# Patient Record
Sex: Male | Born: 1940
Health system: Southern US, Community
[De-identification: ages and names within clinical notes are randomized; demographics above are authoritative.]

## PROBLEM LIST (undated history)

## (undated) DIAGNOSIS — M1990 Unspecified osteoarthritis, unspecified site: Secondary | ICD-10-CM

## (undated) DIAGNOSIS — I779 Disorder of arteries and arterioles, unspecified: Secondary | ICD-10-CM

## (undated) DIAGNOSIS — F329 Major depressive disorder, single episode, unspecified: Secondary | ICD-10-CM

## (undated) DIAGNOSIS — I519 Heart disease, unspecified: Secondary | ICD-10-CM

## (undated) DIAGNOSIS — I4819 Other persistent atrial fibrillation: Secondary | ICD-10-CM

## (undated) DIAGNOSIS — E785 Hyperlipidemia, unspecified: Secondary | ICD-10-CM

## (undated) DIAGNOSIS — S060X9A Concussion with loss of consciousness of unspecified duration, initial encounter: Secondary | ICD-10-CM

## (undated) DIAGNOSIS — I2510 Atherosclerotic heart disease of native coronary artery without angina pectoris: Secondary | ICD-10-CM

## (undated) DIAGNOSIS — G4733 Obstructive sleep apnea (adult) (pediatric): Secondary | ICD-10-CM

## (undated) DIAGNOSIS — C801 Malignant (primary) neoplasm, unspecified: Secondary | ICD-10-CM

## (undated) DIAGNOSIS — I5022 Chronic systolic (congestive) heart failure: Secondary | ICD-10-CM

## (undated) DIAGNOSIS — S060XAA Concussion with loss of consciousness status unknown, initial encounter: Secondary | ICD-10-CM

## (undated) DIAGNOSIS — I340 Nonrheumatic mitral (valve) insufficiency: Secondary | ICD-10-CM

## (undated) DIAGNOSIS — I10 Essential (primary) hypertension: Secondary | ICD-10-CM

## (undated) DIAGNOSIS — E119 Type 2 diabetes mellitus without complications: Secondary | ICD-10-CM

## (undated) DIAGNOSIS — I219 Acute myocardial infarction, unspecified: Secondary | ICD-10-CM

## (undated) DIAGNOSIS — I509 Heart failure, unspecified: Secondary | ICD-10-CM

## (undated) DIAGNOSIS — E079 Disorder of thyroid, unspecified: Secondary | ICD-10-CM

## (undated) HISTORY — DX: Obstructive sleep apnea (adult) (pediatric): G47.33

## (undated) HISTORY — PX: CAROTID ARTERY ANGIOPLASTY: SHX1300

## (undated) HISTORY — DX: Concussion with loss of consciousness status unknown, initial encounter: S06.0XAA

## (undated) HISTORY — DX: Atherosclerotic heart disease of native coronary artery without angina pectoris: I25.10

## (undated) HISTORY — DX: Major depressive disorder, single episode, unspecified: F32.9

## (undated) HISTORY — PX: CARDIAC CATHETERIZATION: SHX172

## (undated) HISTORY — DX: Type 2 diabetes mellitus without complications: E11.9

## (undated) HISTORY — PX: CORONARY ARTERY BYPASS GRAFT: SHX141

## (undated) HISTORY — DX: Essential (primary) hypertension: I10

## (undated) HISTORY — DX: Unspecified osteoarthritis, unspecified site: M19.90

## (undated) HISTORY — PX: TOTAL HIP ARTHROPLASTY: SHX124

## (undated) HISTORY — PX: CORONARY ANGIOPLASTY: SHX604

## (undated) HISTORY — DX: Heart disease, unspecified: I51.9

## (undated) HISTORY — DX: Hyperlipidemia, unspecified: E78.5

## (undated) HISTORY — DX: Concussion with loss of consciousness of unspecified duration, initial encounter: S06.0X9A

## (undated) HISTORY — DX: Disorder of thyroid, unspecified: E07.9

---
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
## 1999-01-15 HISTORY — PX: BYPASS GRAFT: SHX909

## 2006-04-02 DIAGNOSIS — E785 Hyperlipidemia, unspecified: Secondary | ICD-10-CM | POA: Insufficient documentation

## 2006-04-22 DIAGNOSIS — N400 Enlarged prostate without lower urinary tract symptoms: Secondary | ICD-10-CM | POA: Insufficient documentation

## 2006-04-22 DIAGNOSIS — M1990 Unspecified osteoarthritis, unspecified site: Secondary | ICD-10-CM | POA: Insufficient documentation

## 2007-01-01 DIAGNOSIS — R972 Elevated prostate specific antigen [PSA]: Secondary | ICD-10-CM | POA: Insufficient documentation

## 2010-01-24 DIAGNOSIS — S060XAA Concussion with loss of consciousness status unknown, initial encounter: Secondary | ICD-10-CM | POA: Insufficient documentation

## 2010-01-24 DIAGNOSIS — S060X9A Concussion with loss of consciousness of unspecified duration, initial encounter: Secondary | ICD-10-CM | POA: Insufficient documentation

## 2010-06-18 DIAGNOSIS — E559 Vitamin D deficiency, unspecified: Secondary | ICD-10-CM | POA: Insufficient documentation

## 2013-04-20 DIAGNOSIS — E785 Hyperlipidemia, unspecified: Secondary | ICD-10-CM | POA: Insufficient documentation

## 2013-04-20 DIAGNOSIS — Z96649 Presence of unspecified artificial hip joint: Secondary | ICD-10-CM | POA: Insufficient documentation

## 2013-04-20 DIAGNOSIS — Z9889 Other specified postprocedural states: Secondary | ICD-10-CM | POA: Insufficient documentation

## 2013-11-08 DIAGNOSIS — Z0000 Encounter for general adult medical examination without abnormal findings: Secondary | ICD-10-CM | POA: Insufficient documentation

## 2014-01-11 DIAGNOSIS — I6522 Occlusion and stenosis of left carotid artery: Secondary | ICD-10-CM | POA: Insufficient documentation

## 2014-01-17 HISTORY — PX: CAROTID ENDARTERECTOMY: SUR193

## 2014-03-06 DIAGNOSIS — Z87898 Personal history of other specified conditions: Secondary | ICD-10-CM | POA: Insufficient documentation

## 2014-08-29 ENCOUNTER — Encounter: Payer: Self-pay | Admitting: Physical Therapy

## 2014-08-29 ENCOUNTER — Ambulatory Visit: Payer: Medicare (Managed Care) | Attending: Neurology | Admitting: Physical Therapy

## 2014-08-29 DIAGNOSIS — M2560 Stiffness of unspecified joint, not elsewhere classified: Secondary | ICD-10-CM

## 2014-08-29 DIAGNOSIS — Z7409 Other reduced mobility: Secondary | ICD-10-CM

## 2014-08-29 DIAGNOSIS — R5381 Other malaise: Secondary | ICD-10-CM | POA: Diagnosis present

## 2014-08-29 DIAGNOSIS — M623 Immobility syndrome (paraplegic): Secondary | ICD-10-CM | POA: Diagnosis present

## 2014-08-29 NOTE — Therapy (Signed)
[address] [place], [place], [address] Phone: [phone]   Fax:  [phone]  Physical Therapy Evaluation  Patient Details  Name: Raymond Christensen MRN: [id number] Date of Birth: Jan 09, 1941 Referring Provider:  Blanch Media, MD  Encounter Date: 08/29/2014      PT End of Session - 08/29/14 1538    Visit Number 1   Date for PT Re-Evaluation 10/29/14   PT Start Time 9449   PT Stop Time 1530   PT Time Calculation (min) 59 min   Activity Tolerance Patient tolerated treatment well   Behavior During Therapy [facility] for tasks assessed/performed      History reviewed. No pertinent past medical history.  History reviewed. No pertinent past surgical history.  There were no vitals filed for this visit.  Visit Diagnosis:  Debility - Plan: PT plan of care cert/re-cert  Stiffness due to immobility - Plan: PT plan of care cert/re-cert      Subjective Assessment - 08/29/14 1438    Subjective Patient reports that he has been having balance issues as well as being he is very stiff overall. reports that he is worse over the past 6 months, reports that he currently required some assist to put shoes and socks on.   Pertinent History MI 1994, bilateral THR's about 20 years ago, ORIF of the right femur 20 years ago, left carotid artery occlusion    Patient Stated Goals move better, put on shoes and socks   Currently in Pain? Yes   Pain Score 2    Pain Location Shoulder   Pain Orientation Left   Pain Descriptors / Indicators Aching   Pain Type Acute pain   Pain Onset In the past 7 days   Pain Frequency Intermittent   Aggravating Factors  reports pain after trying to do some machines at the gym            [facility] PT Assessment - 08/29/14 0001    Assessment   Medical Diagnosis debility   Onset Date/Surgical Date 07/29/14   Prior Therapy no   Precautions   Precautions Posterior Hip   Balance Screen   Has  the patient fallen in the past 6 months No   Has the patient had a decrease in activity level because of a fear of falling?  No   Is the patient reluctant to leave their home because of a fear of falling?  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   Prior Function   Level of Independence Independent   Leisure does not exercise   Posture/Postural Control   Posture Comments fwd head, rounded shoulders, IR of the shoulders   AROM   Overall AROM Comments Very tight LE's   Strength   Overall Strength Comments LE's 4-/5 , core 3/5   Flexibility   Soft Tissue Assessment /Muscle Length --  very tight calves and HS, SLR was to 40 degrees bilaterally   Ambulation/Gait   Gait Comments no assistive device, some difficulty with turns   Standardized Balance Assessment   Standardized Balance Assessment Berg Balance Test   Berg Balance Test   Sit to Stand Able to stand without using hands and stabilize independently   Standing Unsupported Able to stand safely 2 minutes   Sitting with Back Unsupported but Feet Supported on Floor or Stool Able to sit safely and securely 2 minutes   Stand to  Sit Sits safely with minimal use of hands   Transfers Able to transfer safely, minor use of hands   Standing Unsupported with Eyes Closed Able to stand 10 seconds safely   Standing Ubsupported with Feet Together Able to place feet together independently and stand 1 minute safely   From Standing, Reach Forward with Outstretched Arm Can reach confidently >25 cm (10")   From Standing Position, Pick up Object from Floor Able to pick up shoe safely and easily   From Standing Position, Turn to Look Behind Over each Shoulder Looks behind from both sides and weight shifts well   Turn 360 Degrees Able to turn 360 degrees safely one side only in 4 seconds or less   Standing Unsupported, Alternately Place Feet on Step/Stool Able to stand independently and safely  and complete 8 steps in 20 seconds   Standing Unsupported, One Foot in Front Able to plae foot ahead of the other independently and hold 30 seconds   Standing on One Leg Able to lift leg independently and hold 5-10 seconds   Total Score 53                   [facility] Adult PT Treatment/Exercise - 26-Sep-2014 0001    Lumbar Exercises: Aerobic   Tread Mill NuStep Level 5 x 5 minutes   Lumbar Exercises: Machines for Strengthening   Other Lumbar Machine Exercise seated row 25# 2x 15, lats 25#   Other Lumbar Machine Exercise corner stretch, HS stretch and calf stretch                  PT Short Term Goals - 09/26/14 1540    PT SHORT TERM GOAL #1   Title independent with initial HEP   Time 2   Period Weeks   Status New           PT Long Term Goals - 26-Sep-2014 1540    PT LONG TERM GOAL #1   Title understand proper posture and body mechanics   Time 8   Period Weeks   Status New   PT LONG TERM GOAL #2   Title understand safe use of machines at gym    Time 8   Period Weeks   Status New   PT LONG TERM GOAL #3   Title increase SLR t0 > 60 degrees   Time 8   Period Weeks   Status New   PT LONG TERM GOAL #4   Title increase strength of LE's to 4+/5   Time 8   Period Weeks   Status New               Plan - Sep 26, 2014 1539    Clinical Impression Statement Patient very tight LE's, very out of shape, weak core and legs.  Poor posture and mechanics   Pt will benefit from skilled therapeutic intervention in order to improve on the following deficits Cardiopulmonary status limiting activity;Decreased activity tolerance;Decreased balance;Decreased range of motion;Decreased strength;Difficulty walking;Improper body mechanics;Impaired flexibility   Rehab Potential Good   PT Frequency 2x / week   PT Duration 8 weeks   PT Treatment/Interventions Electrical Stimulation;Moist Heat;Ultrasound;Gait training;Neuromuscular re-education;Balance training;Therapeutic  exercise;Therapeutic activities;Functional mobility training;Patient/family education;Manual techniques   PT Next Visit Plan slowly add exercises and balance   Consulted and Agree with Plan of Care Patient          G-Codes - Sep 26, 2014 1544    Functional Assessment Tool Used foto   Functional Limitation Other PT  primary   Other PT Primary Current Status [phone]) At least 40 percent but less than 60 percent impaired, limited or restricted   Other PT Primary Goal Status (V7471) At least 40 percent but less than 60 percent impaired, limited or restricted       Problem List There are no active problems to display for this patient.   Sumner Boast., PT 08/29/2014, 3:48 PM  [address] [place] [place], [place], [address] Phone: [phone]   Fax:  [phone]

## 2014-08-31 ENCOUNTER — Ambulatory Visit: Payer: Medicare (Managed Care) | Admitting: Physical Therapy

## 2014-08-31 ENCOUNTER — Encounter: Payer: Self-pay | Admitting: Physical Therapy

## 2014-08-31 DIAGNOSIS — Z7409 Other reduced mobility: Secondary | ICD-10-CM

## 2014-08-31 DIAGNOSIS — R5381 Other malaise: Secondary | ICD-10-CM

## 2014-08-31 DIAGNOSIS — M2560 Stiffness of unspecified joint, not elsewhere classified: Secondary | ICD-10-CM

## 2014-08-31 NOTE — Therapy (Signed)
[address] [place], [place], [address] Phone: [phone]   Fax:  [phone]  Physical Therapy Treatment  Patient Details  Name: Raymond Christensen MRN: [id number] Date of Birth: August 24, 1940 Referring Provider:  Blanch Media, MD  Encounter Date: 08/31/2014      PT End of Session - 08/31/14 1006    Visit Number 2   Date for PT Re-Evaluation 10/29/14   PT Start Time 0928   PT Stop Time 1010   PT Time Calculation (min) 42 min   Activity Tolerance Patient tolerated treatment well   Behavior During Therapy [facility] for tasks assessed/performed      History reviewed. No pertinent past medical history.  History reviewed. No pertinent past surgical history.  There were no vitals filed for this visit.  Visit Diagnosis:  Debility  Stiffness due to immobility      Subjective Assessment - 08/31/14 0928    Subjective Pt reports that everything is fine.    Pertinent History MI 1994, bilateral THR's about 20 years ago, ORIF of the right femur 20 years ago, left carotid artery occlusion    Patient Stated Goals move better, put on shoes and socks   Currently in Pain? Yes   Pain Location Shoulder   Pain Orientation Left   Pain Descriptors / Indicators Aching   Pain Type Acute pain                         [facility] Adult PT Treatment/Exercise - 08/31/14 0001    Exercises   Exercises Shoulder;Knee/Hip   Lumbar Exercises: Machines for Strengthening   Other Lumbar Machine Exercise corner stretch, HS stretch, and calf stretch   Knee/Hip Exercises: Aerobic   Elliptical R5 L5 x4 min    Nustep L3 x8 min    Knee/Hip Exercises: Machines for Strengthening   Cybex Knee Extension #35 15 reps 2 sets    Cybex Knee Flexion #45 15 reps 2 sets    Cybex Leg Press #70 10 reps 3 sets    Knee/Hip Exercises: Seated   Sit to Sand 10 reps;without UE support  2sets holding blue weighted ball   Shoulder  Exercises: Power [employer] 10 reps  3 sets    Row Limitations #45    Other Power Tower Exercises Lat pull downs #45 10 reps 3 sets                   PT Short Term Goals - 08/31/14 1008    PT SHORT TERM GOAL #1   Title independent with initial HEP   Status Achieved           PT Long Term Goals - 08/31/14 1009    PT LONG TERM GOAL #2   Title understand safe use of machines at gym    Status On-going               Plan - 08/31/14 1006    Clinical Impression Statement Pt able to tolerate gym level exercises. Pt has very tight hamstrings and weak core. Pt reports having to get down in his knees to lift items off of the floor    Pt will benefit from skilled therapeutic intervention in order to improve on the following deficits Cardiopulmonary status limiting activity;Decreased activity tolerance;Decreased balance;Decreased range of motion;Decreased strength;Difficulty walking;Improper body mechanics;Impaired flexibility   Rehab Potential Good   PT Frequency 2x / week  PT Duration 8 weeks   PT Treatment/Interventions Electrical Stimulation;Moist Heat;Ultrasound;Gait training;Neuromuscular re-education;Balance training;Therapeutic exercise;Therapeutic activities;Functional mobility training;Patient/family education;Manual techniques   PT Next Visit Plan slowly add exercises and balance        Problem List There are no active problems to display for this patient.   Scot Jun, PTA  08/31/2014, 10:10 AM  [address] [place], [place], [address] Phone: [phone]   Fax:  [phone]

## 2014-09-06 ENCOUNTER — Ambulatory Visit: Payer: Medicare (Managed Care) | Admitting: Physical Therapy

## 2014-09-08 ENCOUNTER — Ambulatory Visit: Payer: Medicare (Managed Care) | Admitting: Physical Therapy

## 2014-09-08 ENCOUNTER — Encounter: Payer: Self-pay | Admitting: Physical Therapy

## 2014-09-08 DIAGNOSIS — Z7409 Other reduced mobility: Secondary | ICD-10-CM

## 2014-09-08 DIAGNOSIS — R5381 Other malaise: Secondary | ICD-10-CM

## 2014-09-08 DIAGNOSIS — M2560 Stiffness of unspecified joint, not elsewhere classified: Secondary | ICD-10-CM

## 2014-09-08 NOTE — Therapy (Signed)
[address] [place], [place], [address] Phone: [phone]   Fax:  [phone]  Physical Therapy Treatment  Patient Details  Name: Raymond Christensen MRN: [id number] Date of Birth: January 01, 1941 Referring Provider:  Blanch Media, MD  Encounter Date: 09/08/2014      PT End of Session - 09/08/14 1427    Visit Number 3   Date for PT Re-Evaluation 10/29/14   PT Start Time 1347   PT Stop Time 1430   PT Time Calculation (min) 43 min   Activity Tolerance Patient tolerated treatment well   Behavior During Therapy [facility] for tasks assessed/performed      History reviewed. No pertinent past medical history.  History reviewed. No pertinent past surgical history.  There were no vitals filed for this visit.  Visit Diagnosis:  Debility  Stiffness due to immobility      Subjective Assessment - 09/08/14 1350    Subjective Pt reports that things are going fine, no new issues   Pertinent History MI 1994, bilateral THR's about 20 years ago, ORIF of the right femur 20 years ago, left carotid artery occlusion    Patient Stated Goals move better, put on shoes and socks   Currently in Pain? No/denies   Pain Score 0-No pain                         [facility] Adult PT Treatment/Exercise - 09/08/14 0001    Ambulation/Gait   Stairs Yes   Stairs Assistance 6: Modified independent (Device/Increase time)   Stair Management Technique No rails;One rail Right;One rail Left  26   Number of Stairs 24   Height of Stairs 6   Lumbar Exercises: Aerobic   Stationary Bike L0 4 min   Knee/Hip Exercises: Aerobic   Elliptical R5 L10 x4 min    Knee/Hip Exercises: Machines for Strengthening   Cybex Knee Extension #55 15 reps 2 sets    Cybex Knee Flexion #55 15 reps 2 sets    Cybex Leg Press #15 15 reps 2 sets    Knee/Hip Exercises: Seated   Sit to Sand without UE support;15 reps  holding blue weighted ball out, 2  sets    Shoulder Exercises: Power [employer] 15 reps  2 sets    Row Limitations #55   Other Power [employer] Exercises Lat pull downs #55 15 reps 2 sets                   PT Short Term Goals - 08/31/14 1008    PT SHORT TERM GOAL #1   Title independent with initial HEP   Status Achieved           PT Long Term Goals - 09/08/14 1429    PT LONG TERM GOAL #2   Title understand safe use of machines at gym    Status On-going   PT LONG TERM GOAL #3   Status New   PT LONG TERM GOAL #4   Title increase strength of LE's to 4+/5   Status On-going               Plan - 09/08/14 1427    Clinical Impression Statement Pt bilat hamstrings remain very tight. Pt tolerated treatment well evident by no subjective c/o increase pain. Pt continues to fatigue easily during ther ex   Pt will benefit from skilled therapeutic intervention in order to improve on  the following deficits Cardiopulmonary status limiting activity;Decreased activity tolerance;Decreased balance;Decreased range of motion;Decreased strength;Difficulty walking;Improper body mechanics;Impaired flexibility   Rehab Potential Good   PT Frequency 2x / week   PT Duration 8 weeks   PT Treatment/Interventions Electrical Stimulation;Moist Heat;Ultrasound;Gait training;Neuromuscular re-education;Balance training;Therapeutic exercise;Therapeutic activities;Functional mobility training;Patient/family education;Manual techniques   PT Next Visit Plan continue slowly add exercises and balance        Problem List There are no active problems to display for this patient.   Scot Jun, PTA  09/08/2014, 2:30 PM  [address] [place], [place], [address] Phone: [phone]   Fax:  [phone]

## 2014-09-14 ENCOUNTER — Encounter: Payer: Self-pay | Admitting: Physical Therapy

## 2014-09-14 ENCOUNTER — Ambulatory Visit: Payer: Medicare (Managed Care) | Admitting: Physical Therapy

## 2014-09-14 DIAGNOSIS — R5381 Other malaise: Secondary | ICD-10-CM

## 2014-09-14 DIAGNOSIS — M2560 Stiffness of unspecified joint, not elsewhere classified: Secondary | ICD-10-CM

## 2014-09-14 DIAGNOSIS — Z7409 Other reduced mobility: Secondary | ICD-10-CM

## 2014-09-14 NOTE — Therapy (Signed)
[place] [address] [place], [place], [address] Phone: [phone]   Fax:  [phone]  Physical Therapy Treatment  Patient Details  Name: Raymond Christensen MRN: [id number] Date of Birth: 1940/03/07 Referring Provider:  Blanch Media, MD  Encounter Date: 09/14/2014      PT End of Session - 09/14/14 1517    Visit Number 4   Date for PT Re-Evaluation 10/29/14   PT Start Time 2620   PT Stop Time 1518   PT Time Calculation (min) 47 min   Activity Tolerance Patient tolerated treatment well;Patient limited by fatigue   Behavior During Therapy [facility] for tasks assessed/performed      History reviewed. No pertinent past medical history.  History reviewed. No pertinent past surgical history.  There were no vitals filed for this visit.  Visit Diagnosis:  Debility  Stiffness due to immobility      Subjective Assessment - 09/14/14 1439    Subjective Pt reports he had L shoulder  pain that lasted a few days after last treatment.    Currently in Pain? No/denies   Pain Score 0-No pain                         [facility] Adult PT Treatment/Exercise - 09/14/14 0001    Lumbar Exercises: Aerobic   Tread Mill NuStep Level 4 x 7 minutes   Lumbar Exercises: Machines for Strengthening   Other Lumbar Machine Exercise Standing AR press #45 10 reps 2 sets    Knee/Hip Exercises: Aerobic   Elliptical R5 L10 x4 min    Knee/Hip Exercises: Machines for Strengthening   Cybex Knee Extension #55 15 reps 2 sets    Cybex Knee Flexion #65 10 reps 3 sets    Cybex Leg Press #80 15 reps 2 sets    Knee/Hip Exercises: Seated   Sit to Sand without UE support;15 reps  2 sets, holding blue weighted ball   Shoulder Exercises: Power [employer] 10 reps  2 sets    Row Limitations #55   Other Power Tower Exercises Lat pull downs #55 10 reps 2 sets    Manual Therapy   Manual Therapy Passive ROM   Manual therapy comments Tight  bilat hamstrings    Passive ROM Hip flexion, IR, ER,                   PT Short Term Goals - 08/31/14 1008    PT SHORT TERM GOAL #1   Title independent with initial HEP   Status Achieved           PT Long Term Goals - 09/14/14 1520    PT LONG TERM GOAL #2   Title understand safe use of machines at gym    Status Partially Met   PT LONG TERM GOAL #3   Title increase SLR t0 > 60 degrees   Status On-going               Plan - 09/14/14 1518    Clinical Impression Statement Pt continues to show signs of fatigue during exercises. Pt hamstrings remain tight bilaterally. Pt reports pain in lateral humerus after last PT session so repetitions for shoulder interventions were decreased from last treatment.    Pt will benefit from skilled therapeutic intervention in order to improve on the following deficits Cardiopulmonary status limiting activity;Decreased activity tolerance;Decreased balance;Decreased range of motion;Decreased strength;Difficulty walking;Improper body mechanics;Impaired  flexibility   Rehab Potential Good   PT Frequency 2x / week   PT Duration 8 weeks   PT Treatment/Interventions Electrical Stimulation;Moist Heat;Ultrasound;Gait training;Neuromuscular re-education;Balance training;Therapeutic exercise;Therapeutic activities;Functional mobility training;Patient/family education;Manual techniques   PT Next Visit Plan continue slowly add exercises and balance        Problem List There are no active problems to display for this patient.   Scot Jun, PTA  09/14/2014, 3:22 PM  [address] [place], [place], [address] Phone: [phone]   Fax:  [phone]

## 2014-09-16 ENCOUNTER — Ambulatory Visit: Payer: Medicare (Managed Care) | Attending: Neurology | Admitting: Physical Therapy

## 2014-09-16 ENCOUNTER — Encounter: Payer: Self-pay | Admitting: Physical Therapy

## 2014-09-16 DIAGNOSIS — M2560 Stiffness of unspecified joint, not elsewhere classified: Secondary | ICD-10-CM

## 2014-09-16 DIAGNOSIS — M623 Immobility syndrome (paraplegic): Secondary | ICD-10-CM | POA: Diagnosis present

## 2014-09-16 DIAGNOSIS — R5381 Other malaise: Secondary | ICD-10-CM | POA: Diagnosis present

## 2014-09-16 DIAGNOSIS — Z7409 Other reduced mobility: Secondary | ICD-10-CM

## 2014-09-16 NOTE — Therapy (Signed)
[address] [place], [place], [address] Phone: [phone]   Fax:  [phone]  Physical Therapy Treatment  Patient Details  Name: Raymond Christensen MRN: [id number] Date of Birth: 1940/06/25 Referring Provider:  Blanch Media, MD  Encounter Date: 09/16/2014      PT End of Session - 09/16/14 1014    Visit Number 5   Date for PT Re-Evaluation 10/29/14   PT Start Time 0933   PT Stop Time 2671   PT Time Calculation (min) 41 min   Activity Tolerance Patient tolerated treatment well;Patient limited by fatigue   Behavior During Therapy [facility] for tasks assessed/performed      History reviewed. No pertinent past medical history.  History reviewed. No pertinent past surgical history.  There were no vitals filed for this visit.  Visit Diagnosis:  Debility  Stiffness due to immobility      Subjective Assessment - 09/16/14 0934    Subjective Pt reports that things are going fine   Pertinent History MI 1994, bilateral THR's about 20 years ago, ORIF of the right femur 20 years ago, left carotid artery occlusion    Patient Stated Goals move better, put on shoes and socks   Currently in Pain? Yes   Pain Score 2    Pain Location Shoulder   Pain Orientation Left                         [facility] Adult PT Treatment/Exercise - 09/16/14 0001    Ambulation/Gait   Stairs Yes   Stairs Assistance 6: Modified independent (Device/Increase time)   Stair Management Technique No rails;One rail Right;One rail Left   Number of Stairs 24   Height of Stairs 6   Gait Comments x2 fatigues easily    Lumbar Exercises: Aerobic   Stationary Bike L0 4 min   Knee/Hip Exercises: Stretches   Passive Hamstring Stretch 3 reps;10 seconds   Knee/Hip Exercises: Machines for Strengthening   Cybex Knee Extension #65 15 reps 2 sets    Cybex Knee Flexion #65 15 reps 3 sets    Cybex Leg Press #100 15 reps 2 sets    Knee/Hip Exercises: Seated   Sit to Sand without UE support;15 reps  with outreached arms holding blue weighted ball   Shoulder Exercises: Power [employer] 10 reps  3 sets    Row Limitations #55   Other Power Tower Exercises Lat pull downs #55 10 reps 3 sets    Manual Therapy   Manual Therapy Passive ROM   Manual therapy comments Tight bilat hamstrings    Passive ROM Hip flexion, IR, ER,                   PT Short Term Goals - 08/31/14 1008    PT SHORT TERM GOAL #1   Title independent with initial HEP   Status Achieved           PT Long Term Goals - 09/16/14 1017    PT LONG TERM GOAL #1   Title understand proper posture and body mechanics   Status Partially Met   PT LONG TERM GOAL #3   Title increase SLR t0 > 60 degrees   Status Partially Met   PT LONG TERM GOAL #4   Title increase strength of LE's to 4+/5   Status Partially Met  Plan - 09/16/14 1015    Clinical Impression Statement Pt bilat hamstrings remains very tight. Pt reports issues getting in and out of the car. Pt competed all interventions today but does require extended rest breaks to recover. Pt informed of the importance of hi being more mobile at home.    Pt will benefit from skilled therapeutic intervention in order to improve on the following deficits Cardiopulmonary status limiting activity;Decreased activity tolerance;Decreased balance;Decreased range of motion;Decreased strength;Difficulty walking;Improper body mechanics;Impaired flexibility   Rehab Potential Good   PT Frequency 2x / week   PT Duration 8 weeks   PT Treatment/Interventions Electrical Stimulation;Moist Heat;Ultrasound;Gait training;Neuromuscular re-education;Balance training;Therapeutic exercise;Therapeutic activities;Functional mobility training;Patient/family education;Manual techniques   PT Next Visit Plan continue slowly add exercises and balance        Problem List There are no active problems to  display for this patient.    G , PTA  09/16/2014, 10:17 AM  [facility] [address] , [place], [address] Phone: [phone]   Fax:  [phone]      

## 2014-09-20 ENCOUNTER — Ambulatory Visit: Payer: Medicare (Managed Care) | Admitting: Physical Therapy

## 2014-09-20 ENCOUNTER — Encounter: Payer: Self-pay | Admitting: Physical Therapy

## 2014-09-20 DIAGNOSIS — Z7409 Other reduced mobility: Secondary | ICD-10-CM

## 2014-09-20 DIAGNOSIS — R5381 Other malaise: Secondary | ICD-10-CM | POA: Diagnosis not present

## 2014-09-20 DIAGNOSIS — M2560 Stiffness of unspecified joint, not elsewhere classified: Secondary | ICD-10-CM

## 2014-09-20 NOTE — Therapy (Signed)
[place], [place], [address] Phone: [phone]   Fax:  [phone]  Physical Therapy Treatment  Patient Details  Name: Raymond Christensen MRN: [id number] Date of Birth: 1940-03-25 Referring Provider:  Blanch Media, MD  Encounter Date: 09/20/2014      PT End of Session - 09/20/14 1228    Visit Number 6   Date for PT Re-Evaluation 10/29/14   PT Start Time 1740   PT Stop Time 1228   PT Time Calculation (min) 42 min   Activity Tolerance Patient tolerated treatment well;Patient limited by fatigue   Behavior During Therapy [facility] for tasks assessed/performed      History reviewed. No pertinent past medical history.  History reviewed. No pertinent past surgical history.  There were no vitals filed for this visit.  Visit Diagnosis:  Debility  Stiffness due to immobility      Subjective Assessment - 09/20/14 1151    Subjective Pt reports that things are going pretty good. Pt reports walking a mile this weekend at a very brisk pace.    Pertinent History MI 1994, bilateral THR's about 20 years ago, ORIF of the right femur 20 years ago, left carotid artery occlusion    Patient Stated Goals move better, put on shoes and socks   Currently in Pain? No/denies   Pain Score 0-No pain                         [facility] Adult PT Treatment/Exercise - 09/20/14 0001    Ambulation/Gait   Stairs Yes   Stairs Assistance 6: Modified independent (Device/Increase time)   Stair Management Technique No rails;One rail Right;One rail Left   Number of Stairs 24   Height of Stairs 6   Gait Comments x3  fatigues easily    Lumbar Exercises: Aerobic   Tread Mill NuStep Level 6 x 7 minutes   Lumbar Exercises: Machines for Strengthening   Other Lumbar Machine Exercise Standing AR press #45 10 reps 2 sets    Knee/Hip Exercises: Machines for Strengthening   Cybex Knee Extension #75 15 reps 2 sets    Cybex Knee Flexion #75 15 reps 2 sets    Cybex Leg Press #110 15 reps 2 sets    Shoulder Exercises: Standing   Row 10 reps  3 sets    Row Weight (lbs) 65   Other Standing Exercises standing straight arm pull downs 65 2 sets 15 reps    Shoulder Exercises: Power [employer] Lat pull downs #65 15 reps 2 sets    Manual Therapy   Manual Therapy Passive ROM   Manual therapy comments Tight bilat hamstrings    Passive ROM Hip flexion, IR, ER,                   PT Short Term Goals - 08/31/14 1008    PT SHORT TERM GOAL #1   Title independent with initial HEP   Status Achieved           PT Long Term Goals - 09/16/14 1017    PT LONG TERM GOAL #1   Title understand proper posture and body mechanics   Status Partially Met   PT LONG TERM GOAL #3   Title increase SLR t0 > 60 degrees   Status Partially Met   PT LONG TERM GOAL #4   Title increase strength of LE's to  4+/5   Status Partially Met               Plan - 09/20/14 1228    Clinical Impression Statement Pt continues to fatigue with exercise. Pt again educated on the importance to increase activity at home. Pt continues to require extended rest breaks during treatment. Pt continues to have very tight hamstrings, Pt reports sitting for majority of the day.   Pt will benefit from skilled therapeutic intervention in order to improve on the following deficits Cardiopulmonary status limiting activity;Decreased activity tolerance;Decreased balance;Decreased range of motion;Decreased strength;Difficulty walking;Improper body mechanics;Impaired flexibility   Rehab Potential Good   PT Frequency 2x / week   PT Duration 8 weeks   PT Treatment/Interventions Electrical Stimulation;Moist Heat;Ultrasound;Gait training;Neuromuscular re-education;Balance training;Therapeutic exercise;Therapeutic activities;Functional mobility training;Patient/family education;Manual techniques   PT Next Visit Plan continue  slowly add exercises and balance        Problem List There are no active problems to display for this patient.   Scot Jun, PTA 09/20/2014, 12:31 PM  [place] [address] [place], [place], [address] Phone: [phone]   Fax:  [phone]

## 2014-09-23 ENCOUNTER — Encounter: Payer: Self-pay | Admitting: Physical Therapy

## 2014-09-23 ENCOUNTER — Ambulatory Visit: Payer: Medicare (Managed Care) | Admitting: Physical Therapy

## 2014-09-23 DIAGNOSIS — R5381 Other malaise: Secondary | ICD-10-CM | POA: Diagnosis not present

## 2014-09-23 DIAGNOSIS — Z7409 Other reduced mobility: Secondary | ICD-10-CM

## 2014-09-23 DIAGNOSIS — M2560 Stiffness of unspecified joint, not elsewhere classified: Secondary | ICD-10-CM

## 2014-09-23 NOTE — Therapy (Signed)
[address] [place], [place], [address] Phone: [phone]   Fax:  [phone]  Physical Therapy Treatment  Patient Details  Name: Raymond Christensen MRN: [id number] Date of Birth: [birth year] Referring Provider:  Blanch Media, MD  Encounter Date: 09/23/2014      PT End of Session - 09/23/14 0928    Visit Number 7   Date for PT Re-Evaluation 10/29/14   PT Start Time 0841   PT Stop Time 0935   PT Time Calculation (min) 54 min      History reviewed. No pertinent past medical history.  History reviewed. No pertinent past surgical history.  There were no vitals filed for this visit.  Visit Diagnosis:  Debility  Stiffness due to immobility      Subjective Assessment - 09/23/14 0841    Subjective getting better, started back to gym. 40% better overall. Flexibity is biggest issue   Currently in Pain? No/denies                         [facility] Adult PT Treatment/Exercise - 09/23/14 0001    Exercises   Exercises Lumbar   Lumbar Exercises: Stretches   Active Hamstring Stretch 20 seconds;3 reps  had pt do with sheet to increase home compliance   Passive Hamstring Stretch 3 reps;20 seconds  seated on edge of bed to increase home compliance   Lumbar Exercises: Aerobic   Tread Mill NuStep Level 6 x 7 minutes   Lumbar Exercises: Machines for Strengthening   Cybex Lumbar Extension 20# pulleys 2 sets 15  15# pulley rotation   Other Lumbar Machine Exercise lat pull fwd facing for core 20 # 2 sets 15   Other Lumbar Machine Exercise dead lifts 8 # 2 sets 15   Cybex Leg Press #110 15 reps 2 sets    Lumbar Exercises: Prone   Other Prone Lumbar Exercises ext with weighted ball 15 times   Lumbar Exercises: Quadruped   Opposite Arm/Leg Raise 15 reps   Opposite Arm/Leg Raise Limitations decreased stability   Plank 20 sec 1 sets on knees  10 sec on toes 2 sets   Knee/Hip Exercises: Supine   Straight Leg Raises Strengthening;Both;2 sets;10 reps  with abd 3#   Other Supine Knee/Hip Exercises bridge with ball 15 times   Other Supine Knee/Hip Exercises weighted ball supine abdominal work                PT Education - 09/23/14 0926    Education provided Yes   Education Details HS stretches   Person(s) Educated Patient   Methods Explanation;Demonstration   Comprehension Verbalized understanding;Returned demonstration          PT Short Term Goals - 08/31/14 1008    PT SHORT TERM GOAL #1   Title independent with initial HEP   Status Achieved           PT Long Term Goals - 09/23/14 0926    PT LONG TERM GOAL #1   Title understand proper posture and body mechanics   Status Partially Met   PT LONG TERM GOAL #2   Title understand safe use of machines at gym    Status On-going   PT LONG TERM GOAL #3   Title increase SLR t0 > 60 degrees   Baseline met Pass not active   Status On-going   PT LONG TERM GOAL #4   Title increase strength  of LE's to 4+/5   Status On-going               Plan - 09/23/14 7681    Clinical Impression Statement pt verb better understanding for need for ex and stretching. increased pass HS but active still very tight, progressing with goals   PT Next Visit Plan CORE strength        Problem List There are no active problems to display for this patient.   PAYSEUR,ANGIE PTA 09/23/2014, 9:31 AM  [place], [place], [address] Phone: [phone]   Fax:  [phone]

## 2014-09-27 ENCOUNTER — Encounter: Payer: Self-pay | Admitting: Physical Therapy

## 2014-09-27 ENCOUNTER — Ambulatory Visit: Payer: Medicare (Managed Care) | Admitting: Physical Therapy

## 2014-09-27 DIAGNOSIS — R5381 Other malaise: Secondary | ICD-10-CM

## 2014-09-27 DIAGNOSIS — Z7409 Other reduced mobility: Secondary | ICD-10-CM

## 2014-09-27 DIAGNOSIS — M2560 Stiffness of unspecified joint, not elsewhere classified: Secondary | ICD-10-CM

## 2014-09-27 NOTE — Therapy (Signed)
[facility] [address] [place], [place], [address] Phone: [phone]   Fax:  [phone]  Physical Therapy Treatment  Patient Details  Name: Raymond Christensen MRN: [id number] Date of Birth: 03-13-1940 Referring Provider:  Fenton Malling, MD  Encounter Date: 09/27/2014      PT End of Session - 09/27/14 1551    Visit Number 8   Date for PT Re-Evaluation 10/29/14   PT Start Time 1514   PT Stop Time 1558   PT Time Calculation (min) 44 min   Activity Tolerance Patient tolerated treatment well;Patient limited by fatigue   Behavior During Therapy [facility] for tasks assessed/performed      History reviewed. No pertinent past medical history.  History reviewed. No pertinent past surgical history.  There were no vitals filed for this visit.  Visit Diagnosis:  Debility  Stiffness due to immobility      Subjective Assessment - 09/27/14 1527    Subjective Pt reports that things are getting better but still has some stiffness in hamstrings    Pertinent History MI 1994, bilateral THR's about 20 years ago, ORIF of the right femur 20 years ago, left carotid artery occlusion    Patient Stated Goals move better, put on shoes and socks   Currently in Pain? No/denies   Pain Score 0-No pain                         [facility] Adult PT Treatment/Exercise - 09/27/14 0001    Ambulation/Gait   Stairs Yes   Stairs Assistance 6: Modified independent (Device/Increase time)   Stair Management Technique No rails;One rail Right;One rail Left;Two rails   Number of Stairs 24   Height of Stairs 6   Gait Comments X3, Second set bilat rails skipping step when ascending    Lumbar Exercises: Stretches   Active Hamstring Stretch 20 seconds;5 reps  with belt    Knee/Hip Exercises: Aerobic   Elliptical R5 L10 x5 min    Knee/Hip Exercises: Machines for Strengthening   Cybex Knee Extension #55 3 sets 10 reps    Cybex Knee  Flexion #55 3 sets 120 reps    Cybex Leg Press #120 10 reps 3 sets    Shoulder Exercises: Standing   Other Standing Exercises standing straight arm pull downs 55 2 sets 15 reps    Shoulder Exercises: Power [employer] 10 reps  3 sets    Row Limitations #65   Other Power Tower Exercises Lat pull downs #65 10 reps 3 sets                   PT Short Term Goals - 08/31/14 1008    PT SHORT TERM GOAL #1   Title independent with initial HEP   Status Achieved           PT Long Term Goals - 09/27/14 1555    PT LONG TERM GOAL #2   Title understand safe use of machines at gym    Status Partially Met               Plan - 09/27/14 1553    Clinical Impression Statement Pt reports compliance with home stretching. Pt remains very tight with bilat hamstrings that hinder mobility. Pt reports being more active at home. Pt informed that he should stand more at home when working with his computer.  Demos good strength with ther ex.  Pt will benefit from skilled therapeutic intervention in order to improve on the following deficits Cardiopulmonary status limiting activity;Decreased activity tolerance;Decreased balance;Decreased range of motion;Decreased strength;Difficulty walking;Improper body mechanics;Impaired flexibility   PT Frequency 2x / week   PT Duration 8 weeks   PT Next Visit Plan CORE strength and stretching        Problem List There are no active problems to display for this patient.   Scot Jun, PTA 09/27/2014, 3:59 PM  [address] [place], [place], [address] Phone: [phone]   Fax:  [phone]

## 2014-09-30 ENCOUNTER — Encounter: Payer: Self-pay | Admitting: Physical Therapy

## 2014-09-30 ENCOUNTER — Ambulatory Visit: Payer: Medicare (Managed Care) | Admitting: Physical Therapy

## 2014-09-30 DIAGNOSIS — R5381 Other malaise: Secondary | ICD-10-CM | POA: Diagnosis not present

## 2014-09-30 DIAGNOSIS — M2560 Stiffness of unspecified joint, not elsewhere classified: Secondary | ICD-10-CM

## 2014-09-30 DIAGNOSIS — Z7409 Other reduced mobility: Secondary | ICD-10-CM

## 2014-09-30 NOTE — Therapy (Signed)
[place] [address] [place], [place], [address] Phone: [phone]   Fax:  [phone]  Physical Therapy Treatment  Patient Details  Name: Raymond Christensen MRN: [id number] Date of Birth: 11-08-1940 Referring Provider:  Blanch Media, MD  Encounter Date: 09/30/2014      PT End of Session - 09/30/14 0844    Visit Number 9   Date for PT Re-Evaluation 10/29/14   PT Start Time 0800   PT Stop Time 0845   PT Time Calculation (min) 45 min      History reviewed. No pertinent past medical history.  History reviewed. No pertinent past surgical history.  There were no vitals filed for this visit.  Visit Diagnosis:  Debility  Stiffness due to immobility      Subjective Assessment - 09/30/14 0801    Subjective swam yesterday and felt pretty good   Currently in Pain? Yes   Pain Score 3    Pain Location Back            [facility] PT Assessment - 09/30/14 0001    AROM   Overall AROM Comments SLR standing RT 80, Left 75                     [facility] Adult PT Treatment/Exercise - 09/30/14 0001    Lumbar Exercises: Aerobic   Elliptical 21fwd/3backward   Tread Mill NuStep Level 6 x 7 minutes   Lumbar Exercises: Machines for Strengthening   Other Lumbar Machine Exercise lat pull fwd facing for core 20 # 2 sets 15   Other Lumbar Machine Exercise dead lifts 8 # 2 sets 15   Lumbar Exercises: Standing   Other Standing Lumbar Exercises trunk rotation with blue weighted ball 15 times each  OH ext 15 times with blue ball   Knee/Hip Exercises: Machines for Strengthening   Cybex Knee Extension #55 3 sets 10 reps    Cybex Knee Flexion #55 3 sets 10   Cybex Leg Press #120 2 sets 15   Knee/Hip Exercises: Standing   Lateral Step Up Both;1 set;15 reps;Hand Hold: 2;Step Height: 6"  opp ;eg abd   Forward Step Up Both;15 reps;Hand Hold: 2;Step Height: 6"  opp leg ext   Lunge Walking - Round Trips 40 feet 8#   Manual Therapy   Manual Therapy --  active HS stretches                  PT Short Term Goals - 08/31/14 1008    PT SHORT TERM GOAL #1   Title independent with initial HEP   Status Achieved           PT Long Term Goals - 09/30/14 0837    PT LONG TERM GOAL #3   Title increase SLR t0 > 60 degrees   Baseline RT 80,Left 75   Status Achieved               Plan - 09/30/14 0844    Clinical Impression Statement pt improved HS ROM and progressing with all goals. pt becoming more compliant with gym routine   PT Next Visit Plan CORE strength and stretching        Problem List There are no active problems to display for this patient.   PAYSEUR,ANGIE PTA 09/30/2014, 8:49 AM  [place], [place], [address] Phone: [phone]   Fax:  [phone]

## 2014-10-03 ENCOUNTER — Encounter: Payer: Self-pay | Admitting: Physical Therapy

## 2014-10-03 ENCOUNTER — Ambulatory Visit: Payer: Medicare (Managed Care) | Admitting: Physical Therapy

## 2014-10-03 DIAGNOSIS — M2560 Stiffness of unspecified joint, not elsewhere classified: Secondary | ICD-10-CM

## 2014-10-03 DIAGNOSIS — Z7409 Other reduced mobility: Secondary | ICD-10-CM

## 2014-10-03 DIAGNOSIS — R5381 Other malaise: Secondary | ICD-10-CM

## 2014-10-03 NOTE — Therapy (Signed)
[place], [place], [address] Phone: [phone]   Fax:  [phone]  Physical Therapy Treatment  Patient Details  Name: Raymond Christensen MRN: [id number] Date of Birth: 03-Jun-1940 Referring [place]:  Blanch Media, MD  Encounter Date: 10/03/2014      PT End of Session - 10/03/14 0928    Visit Number 10   Date for PT Re-Evaluation 10/29/14   PT Start Time 0846   PT Stop Time 0928   PT Time Calculation (min) 42 min   Activity Tolerance Patient tolerated treatment well;Patient limited by fatigue   Behavior During Therapy [facility] for tasks assessed/performed      History reviewed. No pertinent past medical history.  History reviewed. No pertinent past surgical history.  There were no vitals filed for this visit.  Visit Diagnosis:  Debility  Stiffness due to immobility      Subjective Assessment - 10/03/14 0851    Subjective Pt reports no new changes, feels good , still stiff   Pertinent History MI 1994, bilateral THR's about 20 years ago, ORIF of the right femur 20 years ago, left carotid artery occlusion    Currently in Pain? No/denies   Pain Score 0-No pain                         [facility] Adult PT Treatment/Exercise - 10/03/14 0001    Lumbar Exercises: Aerobic   Elliptical I 10 R 10  65min fwd/2bkwd   Lumbar Exercises: Machines for Strengthening   Other Lumbar Machine Exercise dead lifts 8 # 2 sets 15   Lumbar Exercises: Standing   Other Standing Lumbar Exercises Standing straight arm pull downs #65 2X15    Other Standing Lumbar Exercises Standing AR press #55 2X10    Knee/Hip Exercises: Stretches   Passive Hamstring Stretch 10 seconds;5 reps   Knee/Hip Exercises: Machines for Strengthening   Cybex Leg Press #120 3 sets 15   Knee/Hip Exercises: Seated   Sit to Sand without UE support;15 reps;2 sets  #10   Shoulder Exercises: Power [employer] 15 reps  2 sets    Row Limitations 75#   Other Power [employer] Exercises Lat pull downs #75 15 reps 2 sets    Manual Therapy   Manual Therapy Passive ROM   Manual therapy comments Tight bilat hamstrings, PROM taken to end ranges and held    Passive ROM Hip flexion, IR, ER,                   PT Short Term Goals - 08/31/14 1008    PT SHORT TERM GOAL #1   Title independent with initial HEP   Status Achieved           PT Long Term Goals - 09/30/14 0837    PT LONG TERM GOAL #3   Title increase SLR t0 > 60 degrees   Baseline RT 80,Left 75   Status Achieved               Plan - 10/03/14 0929    Clinical Impression Statement Pt bilat hamstrings remain tight nut slightly improved. Pt encouraged to stretch more at home. All exercises completed today without issue.   Pt will benefit from skilled therapeutic intervention in order to improve on the following deficits Cardiopulmonary status limiting activity;Decreased activity tolerance;Decreased balance;Decreased range of motion;Decreased strength;Difficulty walking;Improper body mechanics;Impaired flexibility   Rehab Potential Good  PT Frequency 2x / week   PT Duration 8 weeks   PT Treatment/Interventions Electrical Stimulation;Moist Heat;Ultrasound;Gait training;Neuromuscular re-education;Balance training;Therapeutic exercise;Therapeutic activities;Functional mobility training;Patient/family education;Manual techniques   PT Next Visit Plan CORE strength and stretching        Problem List There are no active problems to display for this patient.   Scot Jun, PTA  10/03/2014, 9:32 AM  [place], [place], [address] Phone: [phone]   Fax:  [phone]

## 2014-10-19 ENCOUNTER — Ambulatory Visit: Payer: Medicare (Managed Care) | Attending: Neurology | Admitting: Physical Therapy

## 2014-10-19 ENCOUNTER — Encounter: Payer: Self-pay | Admitting: Physical Therapy

## 2014-10-19 DIAGNOSIS — R5381 Other malaise: Secondary | ICD-10-CM | POA: Insufficient documentation

## 2014-10-19 DIAGNOSIS — M623 Immobility syndrome (paraplegic): Secondary | ICD-10-CM | POA: Diagnosis present

## 2014-10-19 DIAGNOSIS — Z7409 Other reduced mobility: Secondary | ICD-10-CM

## 2014-10-19 DIAGNOSIS — M2560 Stiffness of unspecified joint, not elsewhere classified: Secondary | ICD-10-CM

## 2014-10-19 NOTE — Therapy (Signed)
[place] [address] [place], [place], [address] Phone: [phone]   Fax:  [phone]  Physical Therapy Treatment  Patient Details  Name: Raymond Christensen MRN: [id number] Date of Birth: 18-Dec-1940 Referring Provider:  Blanch Media, MD  Encounter Date: 10/19/2014      PT End of Session - 10/19/14 1012    Visit Number 11   Date for PT Re-Evaluation 10/29/14   PT Start Time 0927   PT Stop Time 1012   PT Time Calculation (min) 45 min   Activity Tolerance Patient tolerated treatment well;Patient limited by fatigue   Behavior During Therapy [facility] for tasks assessed/performed      History reviewed. No pertinent past medical history.  History reviewed. No pertinent past surgical history.  There were no vitals filed for this visit.  Visit Diagnosis:  Debility  Stiffness due to immobility      Subjective Assessment - 10/19/14 0926    Subjective Pt reports having a bad week. Pt reports a recent long road trip helping mother in law move.   Patient Stated Goals move better, put on shoes and socks   Currently in Pain? Yes   Pain Score 4    Pain Location Back   Pain Orientation Lower   Pain Descriptors / Indicators Tightness                         [facility] Adult PT Treatment/Exercise - 10/19/14 0001    Ambulation/Gait   Gait Comments three flights of stairs, fatigues quickly   Lumbar Exercises: Stretches   Passive Hamstring Stretch 4 reps;10 seconds   Lumbar Exercises: Aerobic   Tread Mill NuStep Level 5 x 7 minutes   Lumbar Exercises: Machines for Strengthening   Other Lumbar Machine Exercise dead lifts 8 # 2 sets 10   Lumbar Exercises: Standing   Row Power tower;Strengthening;AROM;Both;15 reps  2 sets    Row Limitations #55    Knee/Hip Exercises: Machines for Strengthening   Cybex Knee Extension #55 3 sets 10 reps    Cybex Knee Flexion #55 3 sets 10   Cybex Leg Press #120 3 sets 10  reps   Shoulder Exercises: Standing   Other Standing Exercises standing straight arm pull downs 55 2 sets 15 reps    Shoulder Exercises: Power [employer] 10 reps  3 sets    Row Limitations 85   Other Power Tower Exercises Lat pull downs #85 10 reps 3 sets                   PT Short Term Goals - 08/31/14 1008    PT SHORT TERM GOAL #1   Title independent with initial HEP   Status Achieved           PT Long Term Goals - 09/30/14 0837    PT LONG TERM GOAL #3   Title increase SLR t0 > 60 degrees   Baseline RT 80,Left 75   Status Achieved               Plan - 10/19/14 1013    Clinical Impression Statement Pt tight bilat hamstrings remains. Pt again encourage to be more active at home. Decrease activity tolerance requiring multiple rest breaks. Pt able to complete all exercises without pain.   Pt will benefit from skilled therapeutic intervention in order to improve on the following deficits Cardiopulmonary status limiting activity;Decreased activity tolerance;Decreased  balance;Decreased range of motion;Decreased strength;Difficulty walking;Improper body mechanics;Impaired flexibility   Rehab Potential Good   PT Frequency 2x / week   PT Duration 8 weeks   PT Treatment/Interventions Electrical Stimulation;Moist Heat;Ultrasound;Gait training;Neuromuscular re-education;Balance training;Therapeutic exercise;Therapeutic activities;Functional mobility training;Patient/family education;Manual techniques   PT Next Visit Plan CORE strength and stretching        Problem List There are no active problems to display for this patient.   Scot Jun, PTA  10/19/2014, 10:16 AM  [place] [address] [place], [place], [address] Phone: [phone]   Fax:  [phone]

## 2014-10-21 ENCOUNTER — Ambulatory Visit: Payer: Medicare (Managed Care) | Admitting: Physical Therapy

## 2014-10-21 ENCOUNTER — Encounter: Payer: Self-pay | Admitting: Physical Therapy

## 2014-10-21 DIAGNOSIS — R5381 Other malaise: Secondary | ICD-10-CM

## 2014-10-21 DIAGNOSIS — M2560 Stiffness of unspecified joint, not elsewhere classified: Secondary | ICD-10-CM

## 2014-10-21 DIAGNOSIS — Z7409 Other reduced mobility: Secondary | ICD-10-CM

## 2014-10-21 NOTE — Therapy (Signed)
[place] [address] [place], [place], [address] Phone: [phone]   Fax:  [phone]  Physical Therapy Treatment  Patient Details  Name: Raymond Christensen MRN: [id number] Date of Birth: September 20, 1940 Referring Provider:  Blanch Media, MD  Encounter Date: 10/21/2014      PT End of Session - 10/21/14 1011    Visit Number 12   Date for PT Re-Evaluation 10/29/14   PT Start Time 0930   PT Stop Time 1011   PT Time Calculation (min) 41 min      History reviewed. No pertinent past medical history.  History reviewed. No pertinent past surgical history.  There were no vitals filed for this visit.  Visit Diagnosis:  Debility  Stiffness due to immobility      Subjective Assessment - 10/21/14 0934    Subjective Pt reports no new issues.   Patient Stated Goals move better, put on shoes and socks   Currently in Pain? No/denies   Pain Score 0-No pain            [facility] PT Assessment - 10/21/14 0001    AROM   Overall AROM Comments SLR supine  RT 54, Left 75                     [facility] Adult PT Treatment/Exercise - 10/21/14 0001    Lumbar Exercises: Aerobic   Elliptical I 10 R 5  56mn fwd/2bkwd   Tread Mill NuStep Level 6 x 6 minutes   Lumbar Exercises: Standing   Row Power tower;Strengthening;AROM;Both;15 reps   Row Limitations #65    Other Standing Lumbar Exercises Standing straight arm pull downs #65 2X15    Other Standing Lumbar Exercises Standing AR press #55 2X10    Knee/Hip Exercises: Stretches   Passive Hamstring Stretch 10 seconds;5 reps   Piriformis Stretch 3 reps;10 seconds   Knee/Hip Exercises: Machines for Strengthening   Cybex Knee Extension #65 3 sets 15 reps    Cybex Knee Flexion #65 2 sets 15   Cybex Leg Press #130 3 sets 10 reps   Knee/Hip Exercises: Seated   Sit to Sand without UE support;15 reps;2 sets  with overhead lift with blue weighted ball    Shoulder Exercises:  Standing   Other Standing Exercises standing straight arm pull downs 55 2 sets 15 reps                   PT Short Term Goals - 08/31/14 1008    PT SHORT TERM GOAL #1   Title independent with initial HEP   Status Achieved           PT Long Term Goals - 10/21/14 1002    PT LONG TERM GOAL #2   Title understand safe use of machines at gym    Status Achieved               Plan - 10/21/14 1011    Clinical Impression Statement Pt has met all PT goals, Pt instructed to be more active at home.   Pt will benefit from skilled therapeutic intervention in order to improve on the following deficits Cardiopulmonary status limiting activity;Decreased activity tolerance;Decreased balance;Decreased range of motion;Decreased strength;Difficulty walking;Improper body mechanics;Impaired flexibility   Rehab Potential Good   PT Frequency 2x / week   PT Duration 8 weeks   PT Treatment/Interventions Electrical Stimulation;Moist Heat;Ultrasound;Gait training;Neuromuscular re-education;Balance training;Therapeutic exercise;Therapeutic activities;Functional mobility training;Patient/family education;Manual techniques  PT Next Visit Plan D/C PT      PHYSICAL THERAPY DISCHARGE SUMMARY  Visits from Start of Care: 12  Plan: Patient agrees to discharge.  Patient goals were met. Patient is being discharged due to meeting the stated rehab goals.  ?????       Problem List There are no active problems to display for this patient.   Scot Jun, PTA  10/21/2014, 10:13 AM  [place] [address] [place], [place], [address] Phone: [phone]   Fax:  [phone]

## 2015-01-04 ENCOUNTER — Ambulatory Visit (HOSPITAL_BASED_OUTPATIENT_CLINIC_OR_DEPARTMENT_OTHER): Payer: 59 | Attending: Neurology | Admitting: Radiology

## 2015-01-04 VITALS — Ht 68.0 in | Wt 250.0 lb

## 2015-01-04 DIAGNOSIS — G4733 Obstructive sleep apnea (adult) (pediatric): Secondary | ICD-10-CM | POA: Insufficient documentation

## 2015-01-04 DIAGNOSIS — G4736 Sleep related hypoventilation in conditions classified elsewhere: Secondary | ICD-10-CM | POA: Diagnosis not present

## 2015-01-04 DIAGNOSIS — R0683 Snoring: Secondary | ICD-10-CM | POA: Insufficient documentation

## 2015-01-10 DIAGNOSIS — G4733 Obstructive sleep apnea (adult) (pediatric): Secondary | ICD-10-CM | POA: Diagnosis not present

## 2015-01-10 DIAGNOSIS — R0683 Snoring: Secondary | ICD-10-CM | POA: Diagnosis not present

## 2015-01-10 NOTE — Progress Notes (Addendum)
   Patient Name: Raymond Christensen, [place] Date: 01/04/2015 Gender: Male D.O.B: [birth year] Age (years): 74 Referring Provider: Lora Havens Height (inches): 77 Interpreting Physician: Baird Lyons MD, ABSM Weight (lbs): 250 RPSGT: Jacolyn Reedy BMI: 38 MRN: [id number] Neck Size: 17.50 CLINICAL INFORMATION Sleep Study Type: Unattended Home Sleep Test Indication for sleep study: OSA, Snoring Epworth Sleepiness Score: 8  SLEEP STUDY TECHNIQUE A multi-channel overnight portable sleep study was performed. The channels recorded were: nasal airflow, thoracic respiratory movement, and oxygen saturation with a pulse oximetry. Snoring was also monitored.  MEDICATIONS Patient self administered medications include: N/A.  SLEEP ARCHITECTURE Patient was studied for 377.7 minutes. The sleep efficiency was 97.2 % and the patient was supine for 64.5%. The arousal index was 0.0 per hour.  RESPIRATORY PARAMETERS The overall AHI was 43.2 per hour, with a central apnea index of 1.4 per hour. The oxygen nadir was 80% during sleep.  CARDIAC DATA Mean heart rate during sleep was 57.8 bpm.  IMPRESSIONS - Severe obstructive sleep apnea occurred during this study (AHI = 43.2/h). - No significant central sleep apnea occurred during this study (CAI = 1.4/h). - Severe oxygen desaturation was noted during this study (Min O2 = 80%, Mean saturation 91%). - Patient snored during 35.8% of sleep time.  DIAGNOSIS - Obstructive Sleep Apnea (327.23 [G47.33 ICD-10]) - Nocturnal Hypoxemia (327.26 [G47.36 ICD-10])  RECOMMENDATIONS - Avoid alcohol, sedatives and other CNS depressants that may worsen sleep apnea and disrupt normal sleep architecture. - Sleep hygiene should be reviewed to assess factors that may improve sleep quality. - Weight management and regular exercise should be initiated or continued. - Return to provider to discuss the results of this study. Commonly CPAP titration would be  recommended to establish therapeutic pressure levels for scores in this range. -Sleep Medicine physician consultation is available if desired for therapeutic management.  Deneise Lever Diplomate, American Board of Sleep Medicine  ELECTRONICALLY SIGNED ON:  01/10/2015, 5:09 PM [place] PH: [phone]   FX: [phone] [place]

## 2016-01-03 DIAGNOSIS — L570 Actinic keratosis: Secondary | ICD-10-CM | POA: Insufficient documentation

## 2016-04-11 ENCOUNTER — Ambulatory Visit (INDEPENDENT_AMBULATORY_CARE_PROVIDER_SITE_OTHER): Payer: 59

## 2016-04-11 ENCOUNTER — Ambulatory Visit (INDEPENDENT_AMBULATORY_CARE_PROVIDER_SITE_OTHER): Payer: 59 | Admitting: Podiatry

## 2016-04-11 ENCOUNTER — Ambulatory Visit: Payer: 59

## 2016-04-11 ENCOUNTER — Encounter: Payer: Self-pay | Admitting: Podiatry

## 2016-04-11 VITALS — Resp 16 | Ht 68.0 in | Wt 245.0 lb

## 2016-04-11 DIAGNOSIS — M79675 Pain in left toe(s): Secondary | ICD-10-CM

## 2016-04-11 DIAGNOSIS — L84 Corns and callosities: Secondary | ICD-10-CM | POA: Diagnosis not present

## 2016-04-11 DIAGNOSIS — D169 Benign neoplasm of bone and articular cartilage, unspecified: Secondary | ICD-10-CM

## 2016-04-11 NOTE — Progress Notes (Signed)
   Subjective:    Patient ID: Raymond Christensen, male    DOB: 11-08-1940, 76 y.o.   MRN: [id number]  HPI  Chief Complaint  Patient presents with  . Toe Pain    Left; 3rd-5th x 1 month. Pt stated "toes hurt when I walk"   . Callouses    BL; Medial sides.        Review of Systems     Objective:   Physical Exam        Assessment & Plan:

## 2016-04-12 NOTE — Progress Notes (Signed)
Subjective:     Patient ID: Raymond Christensen, male   DOB: 1940-10-21, 76 y.o.   MRN: [id number]  HPI patient presents stating I have a rotated toe on my left foot that's very tender and it makes it hard for me to wear shoe gear comfortably and I'm not sure what the problem. States it's been there around 3 months   Review of Systems  All other systems reviewed and are negative.      Objective:   Physical Exam  Constitutional: He is oriented to person, place, and time.  Cardiovascular: Intact distal pulses.   Musculoskeletal: Normal range of motion.  Neurological: He is oriented to person, place, and time.  Skin: Skin is warm.  Nursing note and vitals reviewed.  neurovascular status intact muscle strength adequate range of motion within normal limits with patient found have good digital perfusion and well oriented. I noted mild equinus and I noted adductovarus deformity fifth digit left over right foot with distal keratotic lesion on the inside of the fifth toe that's very painful when pressed     Assessment:     Adductovarus deformity fifth digit left with keratotic lesion secondary to pressure with probably exostotic lesion    Plan:     H&P condition reviewed and at this time deep debridement of lesion accomplished. I explained ultimately this may require exostectomy and I did dispensed pads with instructions on usage and he will reappoint as needed  X-ray indicates that there is rotated fifth toes with small osteochondral area on the fifth digit left medial side

## 2016-06-25 DIAGNOSIS — R42 Dizziness and giddiness: Secondary | ICD-10-CM | POA: Insufficient documentation

## 2017-01-10 DIAGNOSIS — N3941 Urge incontinence: Secondary | ICD-10-CM | POA: Insufficient documentation

## 2017-01-10 DIAGNOSIS — R413 Other amnesia: Secondary | ICD-10-CM | POA: Insufficient documentation

## 2017-02-26 ENCOUNTER — Encounter: Payer: Self-pay | Admitting: Family Medicine

## 2017-02-26 ENCOUNTER — Ambulatory Visit: Payer: 59 | Admitting: Family Medicine

## 2017-02-26 VITALS — BP 148/60 | HR 81 | Temp 98.1°F | Ht 67.0 in | Wt 253.2 lb

## 2017-02-26 DIAGNOSIS — E119 Type 2 diabetes mellitus without complications: Secondary | ICD-10-CM

## 2017-02-26 DIAGNOSIS — I10 Essential (primary) hypertension: Secondary | ICD-10-CM | POA: Insufficient documentation

## 2017-02-26 HISTORY — DX: Type 2 diabetes mellitus without complications: E11.9

## 2017-02-26 HISTORY — DX: Essential (primary) hypertension: I10

## 2017-02-26 MED ORDER — METOPROLOL SUCCINATE ER 50 MG PO TB24: 50.0000 mg | ORAL_TABLET | Freq: Every day | ORAL | 3 refills | Status: DC

## 2017-02-26 NOTE — Patient Instructions (Signed)
Around 3 times per week, check your blood pressure 4 times per day. Twice in the morning and twice in the evening. The readings should be at least one minute apart. Write down these values and bring them to your next nurse visit/appointment.  When you check your BP, make sure you have been doing something calm/relaxing 5 minutes prior to checking. Both feet should be flat on the floor and you should be sitting. Use your left arm and make sure it is in a relaxed position (on a table), and that the cuff is at the approximate level/height of your heart.  If you do not hear anything about your referral in the next 1-2 weeks, call our office and ask for an update.  Healthy Eating Plan Many factors influence your heart health, including eating and exercise habits. Heart (coronary) risk increases with abnormal blood fat (lipid) levels. Heart-healthy meal planning includes limiting unhealthy fats, increasing healthy fats, and making other small dietary changes. This includes maintaining a healthy body weight to help keep lipid levels within a normal range.  WHAT IS MY PLAN?  Your health care provider recommends that you:  Drink a glass of water before meals to help with satiety.  Eat slowly.  An alternative to the water is to add Metamucil. This will help with satiety as well. It does contain calories, unlike water.  WHAT TYPES OF FAT SHOULD I CHOOSE?  Choose healthy fats more often. Choose monounsaturated and polyunsaturated fats, such as olive oil and canola oil, flaxseeds, walnuts, almonds, and seeds.  Eat more omega-3 fats. Good choices include salmon, mackerel, sardines, tuna, flaxseed oil, and ground flaxseeds. Aim to eat fish at least two times each week.  Avoid foods with partially hydrogenated oils in them. These contain trans fats. Examples of foods that contain trans fats are stick margarine, some tub margarines, cookies, crackers, and other baked goods. If you are going to avoid a fat,  this is the one to avoid!  WHAT GENERAL GUIDELINES DO I NEED TO FOLLOW?  Check food labels carefully to identify foods with trans fats. Avoid these types of options when possible.  Fill one half of your plate with vegetables and green salads. Eat 4-5 servings of vegetables per day. A serving of vegetables equals 1 cup of raw leafy vegetables,  cup of raw or cooked cut-up vegetables, or  cup of vegetable juice.  Fill one fourth of your plate with whole grains. Look for the word "whole" as the first word in the ingredient list.  Fill one fourth of your plate with lean protein foods.  Eat 4-5 servings of fruit per day. A serving of fruit equals one medium whole fruit,  cup of dried fruit,  cup of fresh, frozen, or canned fruit. Try to avoid fruits in cups/syrups as the sugar content can be high.  Eat more foods that contain soluble fiber. Examples of foods that contain this type of fiber are apples, broccoli, carrots, beans, peas, and barley. Aim to get 20-30 g of fiber per day.  Eat more home-cooked food and less restaurant, buffet, and fast food.  Limit or avoid alcohol.  Limit foods that are high in starch and sugar.  Avoid fried foods when able.  Cook foods by using methods other than frying. Baking, boiling, grilling, and broiling are all great options. Other fat-reducing suggestions include: ? Removing the skin from poultry. ? Removing all visible fats from meats. ? Skimming the fat off of stews, soups, and gravies before  serving them. ? Steaming vegetables in water or broth.  Lose weight if you are overweight. Losing just 5-10% of your initial body weight can help your overall health and prevent diseases such as diabetes and heart disease.  Increase your consumption of nuts, legumes, and seeds to 4-5 servings per week. One serving of dried beans or legumes equals  cup after being cooked, one serving of nuts equals 1 ounces, and one serving of seeds equals  ounce or 1  tablespoon.  WHAT ARE GOOD FOODS CAN I EAT? Grains Grainy breads (try to find bread that is 3 g of fiber per slice or greater), oatmeal, light popcorn. Whole-grain cereals. Rice and pasta, including brown rice and those that are made with whole wheat. Edamame pasta is a great alternative to grain pasta. It has a higher protein content. Try to avoid significant consumption of white bread, sugary cereals, or pastries/baked goods.  Vegetables All vegetables. Cooked white potatoes do not count as vegetables.  Fruits All fruits, but limit pineapple and bananas as these fruits have a higher sugar content.  Meats and Other Protein Sources Lean, well-trimmed beef, veal, pork, and lamb. Chicken and [place] without skin. All fish and shellfish. Wild duck, rabbit, pheasant, and venison. Egg whites or low-cholesterol egg substitutes. Dried beans, peas, lentils, and tofu.Seeds and most nuts.  Dairy Low-fat or nonfat cheeses, including ricotta, string, and mozzarella. Skim or 1% milk that is liquid, powdered, or evaporated. Buttermilk that is made with low-fat milk. Nonfat or low-fat yogurt. Soy/Almond milk are good alternatives if you cannot handle dairy.  Beverages Water is the best for you. Sports drinks with less sugar are more desirable unless you are a highly active athlete.  Sweets and Desserts Sherbets and fruit ices. Honey, jam, marmalade, jelly, and syrups. Dark chocolate.  Eat all sweets and desserts in moderation.  Fats and Oils Nonhydrogenated (trans-free) margarines. Vegetable oils, including soybean, sesame, sunflower, olive, peanut, safflower, corn, canola, and cottonseed. Salad dressings or mayonnaise that are made with a vegetable oil. Limit added fats and oils that you use for cooking, baking, salads, and as spreads.  Other Cocoa powder. Coffee and tea. Most condiments.  The items listed above may not be a complete list of recommended foods or beverages. Contact your dietitian for  more options.

## 2017-02-26 NOTE — Progress Notes (Signed)
Pre visit review using our clinic review tool, if applicable. No additional management support is needed unless otherwise documented below in the visit note. 

## 2017-02-26 NOTE — Progress Notes (Signed)
Chief Complaint  Patient presents with  . Establish Care       New Patient Visit SUBJECTIVE: HPI: Raymond Christensen is an 77 y.o.male who is being seen for establishing care.  The patient was previously seen at [facility].  Here with his wife.  He was recently diagnosed with diabetes around 1 month ago.  He had A1c's drawn in the past that put him in the prediabetic range, however he reports no one told him this.  He has started to exercise at the [facility] around 2-3 times per week.  The patient has been starting to clean up his diet since his diagnosis.  He is interested in seeing a nutritionist.  He is on Zocor and aspirin daily.  He is also on ramipril.  His last eye exam was in September 2018.  He sees Dr. Valetta Close.  He has received his flu shot for this season.  He has received both pneumonia vaccines since turning 66.  He was recently diagnosed with high blood pressure.  He is on ramipril 10 mg twice daily and hydrochlorothiazide 25 mg daily.  He does check his blood pressures at home and they usually run in the 140s-150s/70-80s.  He is tolerating his medicine well.  He reports compliance.  Allergies  Allergen Reactions  . Sulfa Antibiotics Rash    Questionable, he developed a diffuse rash 2 days after stopping Bactrim    Past Medical History:  Diagnosis Date  . Arthritis   . Depression   . Diabetes mellitus without complication ([place])   . Heart disease   . Hyperlipidemia   . Hypertension   . Thyroid disease    Past Surgical History:  Procedure Laterality Date  . CAROTID ARTERY ANGIOPLASTY     Social History   Socioeconomic History  . Marital status: Married  Tobacco Use  . 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 Smokeless tobacco: Never Used  Substance and Sexual Activity  . Alcohol use: Yes  . Drug use: No   Family History  Problem Relation Age of Onset  . Cancer Neg Hx      Current Outpatient Medications:  .  aspirin EC 81 MG tablet, Take by mouth., Disp: , Rfl:  .   DULoxetine (CYMBALTA) 60 MG capsule, Take 60 mg by mouth daily., Disp: , Rfl:  .  ezetimibe (ZETIA) 10 MG tablet, Take 10 mg by mouth., Disp: , Rfl:  .  levothyroxine (SYNTHROID) 50 MCG tablet, Take by mouth., Disp: , Rfl:  .  liothyronine (CYTOMEL) 5 MCG tablet, , Disp: , Rfl:  .  modafinil (PROVIGIL) 200 MG tablet, , Disp: , Rfl:  .  omega-3 acid ethyl esters (LOVAZA) 1 g capsule, Take 2 g by mouth 2 (two) times daily. , Disp: , Rfl:  .  hydrochlorothiazide (HYDRODIURIL) 25 MG tablet, Take 1 tablet (25 mg total) by mouth daily., Disp: 90 tablet, Rfl: 3 .  metoprolol succinate (TOPROL-XL) 50 MG 24 hr tablet, Take 1 tablet (50 mg total) by mouth daily. Take with or immediately following a meal., Disp: 30 tablet, Rfl: 3 .  ramipril (ALTACE) 10 MG capsule, Take 1 capsule (10 mg total) by mouth 2 (two) times daily., Disp: 90 capsule, Rfl: 3 .  simvastatin (ZOCOR) 40 MG tablet, Take by mouth., Disp: , Rfl:   ROS Cardiovascular: Denies chest pain  Respiratory: Denies dyspnea   OBJECTIVE: BP (!) 148/60 (BP Location: Left Arm, Patient Position: Sitting, Cuff Size: Large)   Pulse 81   Temp 98.1  F (36.7 C) (Oral)   Ht 5\' 7"  (1.702 m)   Wt 253 lb 4 oz (114.9 kg)   SpO2 98%   BMI 39.66 kg/m   Constitutional: -  VS reviewed -  Well developed, well nourished, appears stated age -  No apparent distress  Psychiatric: -  Oriented to person, place, and time -  Memory intact -  Affect and mood normal -  Fluent conversation, good eye contact -  Judgment and insight age appropriate  Eye: -  Conjunctivae clear, no discharge -  Pupils symmetric, round, reactive to light  ENMT: -  MMM    Pharynx moist, no exudate, no erythema  Neck: -  No gross swelling, no palpable masses -  Thyroid midline, not enlarged, mobile, no palpable masses  Cardiovascular: -  RRR -   No bruits -  No LE edema  Respiratory: -  Normal respiratory effort, no accessory muscle use, no retraction -  Breath sounds equal, no  wheezes, no ronchi, no crackles  Musculoskeletal: -  No clubbing, no cyanosis -  Gait normal  Skin: -  No significant lesion on inspection -  Warm and dry to palpation   ASSESSMENT/PLAN: Type 2 diabetes mellitus without complication, without long-term current use of insulin (HCC)  Morbid obesity (HCC) - Plan: Amb ref to Medical Nutrition Therapy-MNT  Essential hypertension - Plan: ramipril (ALTACE) 10 MG capsule, hydrochlorothiazide (HYDRODIURIL) 25 MG tablet, metoprolol succinate (TOPROL-XL) 50 MG 24 hr tablet  Patient instructed to sign release of records form from his previous PCP. January labs reviewed.  Last A1c 6.5.  I do not believe we need to add any medicine.  Given his history of quadruple bypass and heart attack, his goal should be less than 8.  We will hold off in medicine.  Counseled on diet and exercise.  Healthy diet handout given.  Referral made to nutrition team per his request. We will add metoprolol for his blood pressure.  We will hold off on Norvasc given his current dose of simvastatin.  Could consider changing simvastatin to Lipitor versus Crestor and adding a calcium channel blocker in the future. Patient should return in 6 weeks to reck BP. The patient voiced understanding and agreement to the plan.   [place], DO 02/26/17  11:33 AM

## 2017-02-27 ENCOUNTER — Telehealth: Payer: Self-pay | Admitting: *Deleted

## 2017-02-27 NOTE — Telephone Encounter (Signed)
Received Medical records from [place]; forwarded to provider/SLS 02/14

## 2017-03-04 ENCOUNTER — Encounter: Payer: Self-pay | Admitting: Family Medicine

## 2017-03-18 ENCOUNTER — Encounter: Payer: Medicare (Managed Care) | Attending: Family Medicine | Admitting: Registered"

## 2017-03-18 ENCOUNTER — Encounter: Payer: Self-pay | Admitting: Registered"

## 2017-03-18 DIAGNOSIS — E119 Type 2 diabetes mellitus without complications: Secondary | ICD-10-CM | POA: Diagnosis not present

## 2017-03-18 DIAGNOSIS — Z713 Dietary counseling and surveillance: Secondary | ICD-10-CM | POA: Insufficient documentation

## 2017-03-18 NOTE — Progress Notes (Signed)
Diabetes Self-Management Education  Visit Type: First/Initial  Appt. Start Time: 1600 Appt. End Time: 5427  03/18/2017  [name], identified by name and date of birth, is a 77 y.o. male with a diagnosis of Diabetes: Type 2.   ASSESSMENT This patient is accompanied in the office by his spouse. Patient's spouse states she has had diabetes for many years and has been preparing meals with the MyPlate structure. Patient states he feels he has the most trouble with portion sizes and snacking in the evening. Patient probably does not have too much sodium in diet, they cook most meals, and patient states he eats out maybe ~1x week.  Patient states he enjoys swimming and that it is something that he plans to continue 2x per week. Patient also reports that he intends to start going to the gym for additional cardio and weight training.  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 RD also covered the importance of stress management because he rated stress level anywhere from a 5 to 10 out of 10.   Diabetes Self-Management Education - 03/18/17 1610      Visit Information   Visit Type  First/Initial      Initial Visit   Diabetes Type  Type 2    Are you currently following a meal plan?  No    Are you taking your medications as prescribed?  Not on Medications    Date Diagnosed  Dec 2018      Health Coping   How would you rate your overall health?  Good      Psychosocial Assessment   Patient Belief/Attitude about Diabetes  Motivated to manage diabetes    How often do you need to have someone help you when you read instructions, pamphlets, or other written materials from your doctor or pharmacy?  1 - Never    What is the last grade level you completed in school?  PhD      Complications   Last HgB A1C per patient/outside source  6.7 %    How often do you check your blood sugar?  3-4 times / week    Fasting  Blood glucose range (mg/dL)  130-179 130 doesn't check often    Postprandial Blood glucose range (mg/dL)  70-129 107-130    Number of hypoglycemic episodes per month  0    Number of hyperglycemic episodes per week  0    Have you had a dilated eye exam in the past 12 months?  Yes    Have you had a dental exam in the past 12 months?  Yes    Are you checking your feet?  No      Dietary Intake   Breakfast  1/2 bagel, cottage cheese OR egg, 1/2 bagel, coffee cream    Snack (morning)  none    Lunch  1/2 salami sandwich, lacroix drink, blueberries    Snack (afternoon)  none    Dinner  trout, stuffing, vegetables OR pro, cho, veggies    Snack (evening)  apple, nuts OR cake, apple    Beverage(s)  lacriox water, coffee, ice tea crystal light      Exercise   Exercise Type  Light (walking / raking leaves)    How many days per week to you exercise?  2    How many minutes per day do you exercise?  30    Total minutes per week  of exercise  60      Patient Education   Previous Diabetes Education  No    Nutrition management   Role of diet in the treatment of diabetes and the relationship between the three main macronutrients and blood glucose level    Physical activity and exercise   Role of exercise on diabetes management, blood pressure control and cardiac health.    Monitoring  Identified appropriate SMBG and/or A1C goals.    Psychosocial adjustment  Role of stress on diabetes      Individualized Goals (developed by patient)   Nutrition  General guidelines for healthy choices and portions discussed      Outcomes   Expected Outcomes  Demonstrated interest in learning. Expect positive outcomes    Future DMSE  PRN    Program Status  Completed     Individualized Plan for Diabetes Self-Management Training:   Learning Objective:  Patient will have a greater understanding of diabetes self-management. Patient education plan is to attend individual and/or group sessions per assessed needs and  concerns.   Patient Instructions  Add one more day of exercise to your routine, consider including resistance training. Consider having a regular snack or small meal for lunch (avoid skipping meals). Check nutrition facts from restaurants before going out, if not hungry consider ordering a child's menu or sides. Aim to check in with yourself during the meal.  Aim to eat balanced meals and snacks with not too heavy meal at night. Consider looking into ways to reduce stress.  Expected Outcomes:  Demonstrated interest in learning. Expect positive outcomes  Education material provided: A1C conversion sheet, My Plate and Snack sheet, [employer] flyer  If problems or questions, patient to contact team via:  Phone and MyChart  Future DSME appointment: PRN

## 2017-03-18 NOTE — Patient Instructions (Signed)
Add one more day of exercise to your routine, consider including resistance training. Consider having a regular snack or small meal for lunch (avoid skipping meals). Check nutrition facts from restaurants before going out, if not hungry consider ordering a child's menu or sides. Aim to check in with yourself during the meal.  Aim to eat balanced meals and snacks with not too heavy meal at night. Consider looking into ways to reduce stress.

## 2017-03-21 ENCOUNTER — Encounter: Payer: Self-pay | Admitting: Family Medicine

## 2017-03-21 LAB — HM DIABETES EYE EXAM

## 2017-03-26 ENCOUNTER — Telehealth: Payer: Self-pay | Admitting: *Deleted

## 2017-03-26 NOTE — Telephone Encounter (Signed)
Received Eye Examination Report results from [facility]; forwarded to provider/SLS 03/13

## 2017-04-09 ENCOUNTER — Ambulatory Visit (INDEPENDENT_AMBULATORY_CARE_PROVIDER_SITE_OTHER): Payer: 59 | Admitting: Family Medicine

## 2017-04-09 VITALS — BP 138/70 | HR 60

## 2017-04-09 DIAGNOSIS — I10 Essential (primary) hypertension: Secondary | ICD-10-CM

## 2017-04-09 NOTE — Progress Notes (Signed)
Noted. Agree with above.  [place], DO 04/09/17 12:27 PM

## 2017-04-09 NOTE — Progress Notes (Signed)
Pre visit review using our clinic review tool, if applicable. No additional management support is needed unless otherwise documented below in the visit note.  Pt here for blood pressure check per Dr Nani Ravens.  BP Readings from Last 3 Encounters:  02/26/17 (!) 148/60   Pt currently takes: He is on ramipril 10 mg twice daily and hydrochlorothiazide 25 mg daily.  He was started on Metoprolol 50 mg once a day at last visit.  He brings home readings today ranging from 100s / 60s to 150s /80s.  BP @ 9:08am = 151/76 HR = 60  Repeat BP @ 9:25am = 138/70  Advised pt per verbal from PCP, continue current medications and follow up as scheduled on 04/30/17.

## 2017-04-30 ENCOUNTER — Ambulatory Visit (INDEPENDENT_AMBULATORY_CARE_PROVIDER_SITE_OTHER): Payer: 59 | Admitting: Family Medicine

## 2017-04-30 ENCOUNTER — Encounter: Payer: Self-pay | Admitting: Family Medicine

## 2017-04-30 VITALS — BP 140/62 | HR 65 | Temp 98.2°F | Ht 67.0 in | Wt 249.2 lb

## 2017-04-30 DIAGNOSIS — L84 Corns and callosities: Secondary | ICD-10-CM

## 2017-04-30 DIAGNOSIS — I10 Essential (primary) hypertension: Secondary | ICD-10-CM | POA: Diagnosis not present

## 2017-04-30 DIAGNOSIS — E119 Type 2 diabetes mellitus without complications: Secondary | ICD-10-CM | POA: Diagnosis not present

## 2017-04-30 LAB — COMPREHENSIVE METABOLIC PANEL
ALK PHOS: 62 U/L (ref 39–117)
ALT: 33 U/L (ref 0–53)
AST: 25 U/L (ref 0–37)
Albumin: 4.2 g/dL (ref 3.5–5.2)
BUN: 19 mg/dL (ref 6–23)
CHLORIDE: 100 meq/L (ref 96–112)
CO2: 30 mEq/L (ref 19–32)
Calcium: 9.6 mg/dL (ref 8.4–10.5)
Creatinine, Ser: 1.01 mg/dL (ref 0.40–1.50)
GFR: 76.42 mL/min (ref >60.00)
GLUCOSE: 112 mg/dL — AB (ref 70–99)
POTASSIUM: 4.9 meq/L (ref 3.5–5.1)
SODIUM: 137 meq/L (ref 135–145)
TOTAL PROTEIN: 6.8 g/dL (ref 6.0–8.3)
Total Bilirubin: 0.3 mg/dL (ref 0.2–1.2)

## 2017-04-30 LAB — HEMOGLOBIN A1C: Hgb A1c MFr Bld: 6.2 % (ref 4.6–6.5)

## 2017-04-30 MED ORDER — CHLORTHALIDONE 25 MG PO TABS: 25.0000 mg | ORAL_TABLET | Freq: Every day | ORAL | 2 refills | Status: DC

## 2017-04-30 NOTE — Progress Notes (Signed)
Subjective:   Chief Complaint  Patient presents with  . Diabetes    BS high  . Hypertension    Raymond Christensen is a 77 y.o. male here for follow-up of diabetes.   Raymond Christensen's self monitored glucose range is low 100's.  He checks his glucose levels 4 times per week. Patient does not require insulin.   Medications include: diet controlled Patient exercises 3-4 days per week on average.   He does take an aspirin daily. Statin? Yes ACEi/ARB? Yes  Hypertension Patient presents for hypertension follow up. He does monitor home blood pressures. Blood pressures ranging on average from 140-150's/80's. He is compliant with medication- Toprol 50 mg/d, HCTZ 25 mg/d, Altace 10 mg/d.  Patient has these side effects of medication: none He is adhering to a healthy diet overall. Exercise: Walking and swimming  There is a callus between his fourth and fifth digit on the left.  It is starting to become painful.  No drainage or bleeding.  Past Medical History:  Diagnosis Date  . Arthritis   . CAD (coronary artery disease)   . Depression   . Diabetes mellitus without complication ([place])   . Heart disease   . Hyperlipidemia   . Hypertension   . Thyroid disease     Past Surgical History:  Procedure Laterality Date  . BYPASS GRAFT  2001  . CAROTID ARTERY ANGIOPLASTY    . TOTAL HIP ARTHROPLASTY  1994, 1995      Current Outpatient Medications on File Prior to Visit  Medication Sig Dispense Refill  . aspirin EC 81 MG tablet Take by mouth.    . Cholecalciferol (VITAMIN D3) 10000 units TABS Take by mouth.    . Cyanocobalamin (VITAMIN B-12 PO) Take by mouth.    . DULoxetine (CYMBALTA) 60 MG capsule Take 60 mg by mouth daily.    . finasteride (PROSCAR) 5 MG tablet Take 5 mg by mouth daily.    . folic acid (FOLVITE) 756 MCG tablet Take 400 mcg by mouth daily.    . hydrochlorothiazide (HYDRODIURIL) 25 MG tablet Take 1 tablet (25 mg total) by mouth daily. 90 tablet 3  . levothyroxine (SYNTHROID) 50  MCG tablet Take by mouth.    . liothyronine (CYTOMEL) 5 MCG tablet     . metoprolol succinate (TOPROL-XL) 50 MG 24 hr tablet Take 1 tablet (50 mg total) by mouth daily. Take with or immediately following a meal. 30 tablet 3  . modafinil (PROVIGIL) 200 MG tablet     . omega-3 acid ethyl esters (LOVAZA) 1 g capsule Take 2 g by mouth 2 (two) times daily.     . ramipril (ALTACE) 10 MG capsule Take 1 capsule (10 mg total) by mouth 2 (two) times daily. 90 capsule 3  . simvastatin (ZOCOR) 40 MG tablet Take by mouth.    . Zinc 50 MG TABS Take by mouth.    . ezetimibe (ZETIA) 10 MG tablet Take 10 mg by mouth.     Allergies  Allergen Reactions  . Sulfa Antibiotics Rash    Questionable, he developed a diffuse rash 2 days after stopping Bactrim     Related testing: Foot exam(monofilament and inspection):done Retinal exam:done  Date of retinal exam: 02/2017  Done by:  Dr. Jaye Beagle Pneumovax: Needs PCV13  Review of Systems: Pulmonary:  No SOB Cardiovascular:  No chest pain  Objective:  BP 140/62 (BP Location: Left Arm, Patient Position: Sitting, Cuff Size: Large)   Pulse 65   Temp 98.2 F (36.8  C) (Oral)   Ht 5\' 7"  (1.702 m)   Wt 249 lb 4 oz (113.1 kg)   SpO2 96%   BMI 39.04 kg/m  General:  Well developed, well nourished, in no apparent distress Skin:  On L foot between fourth and fifth digit, there is callus formation without erythema, warmth, fluctuance, or drainage; otherwise his skin is warm, no pallor or diaphoresis Head:  Normocephalic, atraumatic Eyes:  Pupils equal and round, sclera anicteric without injection  Nose:  External nares without trauma, no discharge Throat/Pharynx:  Lips and gingiva without lesion Neck: Neck supple.  No obvious thyromegaly or masses.  No bruits Lungs:  clear to auscultation, breath sounds equal bilaterally, no wheezes, rales, or stridor Cardio:  regular rate and rhythm, no bruits, no LE edema Neuro:  Sensation intact to pinprick on feet  Psych: Age  appropriate judgment and insight  Assessment:   Type 2 diabetes mellitus without complication, without long-term current use of insulin (HCC) - Plan: Hemoglobin A1c, HM DIABETES FOOT EXAM  Essential hypertension - Plan: chlorthalidone (HYGROTON) 25 MG tablet, Comprehensive metabolic panel  Callus of foot - Plan: Ambulatory referral to Podiatry   Plan:   Orders as above. Check A1c, if well controlled, will have him check sugars less. Stop HCTZ, start chlorthalidone. Cont other bp meds. Ck at home.  Refer to podiatry for area on foot.  F/u in 6 weeks. The patient voiced understanding and agreement to the plan.  [place], DO 04/30/17 11:50 AM

## 2017-04-30 NOTE — Patient Instructions (Addendum)
We might have you check your sugars less often.   Continue to check blood pressures.  Stay active.  Keep the diet as clean as possible.  Let us know if you need anything.

## 2017-04-30 NOTE — Progress Notes (Signed)
Pre visit review using our clinic review tool, if applicable. No additional management support is needed unless otherwise documented below in the visit note. 

## 2017-05-07 ENCOUNTER — Ambulatory Visit: Payer: 59 | Admitting: Podiatry

## 2017-05-09 ENCOUNTER — Ambulatory Visit: Payer: 59 | Admitting: Podiatry

## 2017-05-09 ENCOUNTER — Encounter: Payer: Self-pay | Admitting: Podiatry

## 2017-05-09 DIAGNOSIS — L84 Corns and callosities: Secondary | ICD-10-CM | POA: Diagnosis not present

## 2017-05-09 DIAGNOSIS — D169 Benign neoplasm of bone and articular cartilage, unspecified: Secondary | ICD-10-CM

## 2017-05-11 NOTE — Progress Notes (Signed)
Subjective:   Patient ID: Raymond Christensen, male   DOB: 77 y.o.   MRN: [id number]   HPI Patient presents stating he has had a lot of pain again between the fourth and fifth toes on the left foot and states he had quite a bit of relief after the treatment we did last year   ROS      Objective:  Physical Exam  Neurovascular status intact with patient found to have rotational deformity of the fourth and fifth digits left foot with distal medial keratotic lesion digit 5 and lateral lesion digit for that are painful when palpated     Assessment:  Hyperkeratotic lesion digit 5 left with rotational component of the toe and lesion of the fourth toe left     Plan:  H&P condition reviewed and sharp debridement accomplished of each digit lesion.  Discussed surgical intervention and explained exostosis and hammertoe deformity to the patient

## 2017-06-10 ENCOUNTER — Other Ambulatory Visit: Payer: Self-pay

## 2017-06-10 ENCOUNTER — Other Ambulatory Visit: Payer: Self-pay | Admitting: Family Medicine

## 2017-06-10 ENCOUNTER — Ambulatory Visit (INDEPENDENT_AMBULATORY_CARE_PROVIDER_SITE_OTHER): Payer: 59 | Admitting: Family Medicine

## 2017-06-10 VITALS — BP 143/78 | HR 67

## 2017-06-10 DIAGNOSIS — I10 Essential (primary) hypertension: Secondary | ICD-10-CM | POA: Diagnosis not present

## 2017-06-10 MED ORDER — METOPROLOL SUCCINATE ER 50 MG PO TB24: 50.0000 mg | ORAL_TABLET | Freq: Every day | ORAL | 3 refills | Status: DC

## 2017-06-10 MED ORDER — METOPROLOL SUCCINATE ER 50 MG PO TB24: 50.0000 mg | ORAL_TABLET | Freq: Every day | ORAL | 0 refills | Status: DC

## 2017-06-10 NOTE — Progress Notes (Signed)
Noted. Agree with above.  [place], DO 06/10/17 11:55 AM

## 2017-06-10 NOTE — Progress Notes (Signed)
Pre visit review using our clinic tool,if applicable. No additional management support is needed unless otherwise documented below in the visit note.   Pt here for Blood pressure check per Dr. Deloris Ping  Pt currently takes: Toprol 50 mg daily,Chlorthalidone 25 mg daily and Altace 10 mg daily.   Pt reports compliance with medication  BP today @ =143/78 HR =67  Pt advised per Dr. Nani Ravens patient to continue medication as ordered and return in 1 month. Bring BP record wen you come in for next BP check. Patient agreed

## 2017-06-11 ENCOUNTER — Encounter: Payer: Self-pay | Admitting: Family Medicine

## 2017-06-13 ENCOUNTER — Other Ambulatory Visit: Payer: Self-pay | Admitting: Family Medicine

## 2017-06-13 DIAGNOSIS — I10 Essential (primary) hypertension: Secondary | ICD-10-CM

## 2017-06-15 DIAGNOSIS — I771 Stricture of artery: Secondary | ICD-10-CM | POA: Insufficient documentation

## 2017-06-19 ENCOUNTER — Other Ambulatory Visit: Payer: 59

## 2017-06-19 ENCOUNTER — Other Ambulatory Visit (INDEPENDENT_AMBULATORY_CARE_PROVIDER_SITE_OTHER): Payer: 59

## 2017-06-19 DIAGNOSIS — I10 Essential (primary) hypertension: Secondary | ICD-10-CM

## 2017-06-19 LAB — BASIC METABOLIC PANEL
BUN: 27 mg/dL — ABNORMAL HIGH (ref 6–23)
CO2: 31 mEq/L (ref 19–32)
CREATININE: 1.27 mg/dL (ref 0.40–1.50)
Calcium: 9.5 mg/dL (ref 8.4–10.5)
Chloride: 102 mEq/L (ref 96–112)
GFR: 58.65 mL/min — ABNORMAL LOW (ref >60.00)
Glucose, Bld: 124 mg/dL — ABNORMAL HIGH (ref 70–99)
Potassium: 4.4 mEq/L (ref 3.5–5.1)
Sodium: 140 mEq/L (ref 135–145)

## 2017-06-24 ENCOUNTER — Encounter: Payer: Self-pay | Admitting: Family Medicine

## 2017-06-24 DIAGNOSIS — I10 Essential (primary) hypertension: Secondary | ICD-10-CM

## 2017-06-24 MED ORDER — METOPROLOL SUCCINATE ER 50 MG PO TB24: 50.0000 mg | ORAL_TABLET | Freq: Every day | ORAL | 0 refills | Status: DC

## 2017-07-01 ENCOUNTER — Ambulatory Visit (INDEPENDENT_AMBULATORY_CARE_PROVIDER_SITE_OTHER): Payer: 59 | Admitting: Family Medicine

## 2017-07-01 ENCOUNTER — Telehealth: Payer: Self-pay | Admitting: *Deleted

## 2017-07-01 DIAGNOSIS — I10 Essential (primary) hypertension: Secondary | ICD-10-CM

## 2017-07-01 NOTE — Progress Notes (Addendum)
Pre visit review using our clinic tool,if applicable. No additional management support is needed unless otherwise documented below in the visit note.   Pt here for Blood pressure check per   Pt currently takes: Topral 50 mg, ALtace 10 mg,Chlorthalidone 25 mg    No complaints voiced this visit. Patient brought in copy of home BP readings some are as follows: 136/75,125/72,129/76,128/69,128/73,129/91.   Pt reports compliance with medication.  BP today @ =135/83 HR =63  Patient advised per Dr. Charlett Blake  to continue taking medications as ordered and record BP readings weekly. Call office if they began to rise. Keep next  scheduled appointment with Dr. Nani Ravens   Patient agreed.  Nursing blood pressure check note reviewed. Agree with documention and plan.

## 2017-07-01 NOTE — Telephone Encounter (Signed)
Patient dropped off BP readings from 06/12/17 through 06/30/17; forwarded to provider/SLS 06/18

## 2017-07-04 ENCOUNTER — Ambulatory Visit: Payer: 59

## 2017-08-06 ENCOUNTER — Telehealth: Payer: Self-pay | Admitting: Family Medicine

## 2017-08-06 NOTE — Telephone Encounter (Signed)
Copied from [place] [id number]. Topic: Quick Communication - Rx Refill/Question >> Aug 06, 2017 10:29 AM Percell Belt A wrote: Medication: finasteride (PROSCAR) 5 MG tablet [[id number]]   Has the patient contacted their pharmacy? Yes  (Agent: If no, request that the patient contact the pharmacy for the refill.) (Agent: If yes, when and what did the pharmacy advise?)  Preferred Pharmacy (with phone number or street name): [facility]  Agent: Please be advised that RX refills may take up to 3 business days. We ask that you follow-up with your pharmacy.

## 2017-08-07 NOTE — Telephone Encounter (Signed)
LOV 07/01/17 Dr. Nani Ravens No provider name with medication

## 2017-08-08 MED ORDER — FINASTERIDE 5 MG PO TABS: 5.0000 mg | ORAL_TABLET | Freq: Every day | ORAL | 1 refills | Status: DC

## 2017-08-08 NOTE — Telephone Encounter (Signed)
Sent in

## 2017-08-08 NOTE — Telephone Encounter (Signed)
OK to do 90 w 1 ref. TY.

## 2017-08-08 NOTE — Addendum Note (Signed)
Addended by: Sharon Seller B on: 08/08/2017 10:00 AM   Modules accepted: Orders

## 2017-08-11 ENCOUNTER — Ambulatory Visit: Payer: Self-pay | Admitting: *Deleted

## 2017-08-11 NOTE — Telephone Encounter (Signed)
Pt called, stating that he has been having dizziness off and on. None today. He gave a reading of his b/p 129/73. This morning his b/p is 143/79 and no dizziness. When his b/p drops he gets dizzy, sweating and fatigue. No other symptoms. He also stated his b/p's have been 100/63, 119/73 and 98/67.  No triage. He wants Dr. Nani Ravens to give him a call back regarding his b/p's and symptoms when it goes lower. Appointment scheduled as requested.  Will route to flow at [facility] at [facility]. Pt advised to call back if the symptoms return, voiced understanding.  Reason for Disposition . [1] Caller requests to speak ONLY to PCP AND [2] NON-URGENT question . Caller requesting an appointment, triage offered and declined  Answer Assessment - Initial Assessment Questions 1. REASON FOR CALL or QUESTION: "What is your reason for calling today?" or "How can I best help you?" or "What question do you have that I can help answer?"     Pt called today with giving a b/p 129/73 and having some dizziness at times.  2. CALLER: Document the source of call. (e.g., laboratory, patient).     Patient  Protocols used: PCP CALL - NO TRIAGE-A-AH

## 2017-08-13 ENCOUNTER — Encounter: Payer: Self-pay | Admitting: Family Medicine

## 2017-08-13 ENCOUNTER — Other Ambulatory Visit: Payer: Self-pay | Admitting: Family Medicine

## 2017-08-13 ENCOUNTER — Ambulatory Visit: Payer: 59 | Admitting: Family Medicine

## 2017-08-13 VITALS — BP 126/70 | HR 63 | Temp 98.0°F | Ht 67.0 in | Wt 247.0 lb

## 2017-08-13 DIAGNOSIS — F418 Other specified anxiety disorders: Secondary | ICD-10-CM

## 2017-08-13 DIAGNOSIS — I10 Essential (primary) hypertension: Secondary | ICD-10-CM | POA: Diagnosis not present

## 2017-08-13 MED ORDER — SERTRALINE HCL 50 MG PO TABS: 50.0000 mg | ORAL_TABLET | Freq: Every day | ORAL | 3 refills | Status: DC

## 2017-08-13 MED ORDER — ESCITALOPRAM OXALATE 10 MG PO TABS: 10.0000 mg | ORAL_TABLET | Freq: Every day | ORAL | 1 refills | Status: DC

## 2017-08-13 NOTE — Patient Instructions (Addendum)
Continue checking blood pressure.   Take 1/2 tab daily of the chlorthalidone.  Keep the diet clean and stay active.  Please consider counseling. Contact [phone] to schedule an appointment or inquire about cost/insurance coverage.  Coping skills Choose 5 that work for you:  Take a deep breath  Count to 20  Read a book  Do a puzzle  Meditate  Bake  [place] outside  Call a friend  Listen to music  Take a walk  Color  Send a note  Take a bath  Watch a movie  Be alone in a quiet place  Pet an animal  Visit a friend  Journal  Exercise  Stretch   Let me know if you have been on the Lexapro in the past.   Let us know if you need anything.

## 2017-08-13 NOTE — Progress Notes (Signed)
Pre visit review using our clinic review tool, if applicable. No additional management support is needed unless otherwise documented below in the visit note. 

## 2017-08-13 NOTE — Progress Notes (Signed)
Chief Complaint  Patient presents with  . Follow-up    hypotension    Subjective Raymond Christensen is a 77 y.o. male who presents for hypertension follow up. He does monitor home blood pressures. Blood pressures ranging from 90-130's/60-80's on average. He is compliant with medication-chlorthalidone 25 mg daily, Altace 10 mg daily, Toprol-XL 50 mg daily. Patient has these side effects of medication: none; feels lightheaded when his BPs are in 90-100's. He is sometimes adhering to a healthy diet overall.  Depression/anxiety The patient has a history of depression, currently on Cymbalta.  He does not feel this is currently working.  His wife believes he has been more forgetful, anxious, irritable.  This is gotten worse over the past several months.  He is following with neurology and had a recent MRI of his brain that was unremarkable.  He does not follow with a counselor or psychologist.   Past Medical History:  Diagnosis Date  . Arthritis   . CAD (coronary artery disease)   . Depression   . Diabetes mellitus without complication ([place])   . Heart disease   . Hyperlipidemia   . Hypertension   . Thyroid disease     Review of Systems Cardiovascular: no chest pain Respiratory:  no shortness of breath  Exam BP 126/70 (BP Location: Left Arm, Patient Position: Sitting, Cuff Size: Large)   Pulse 63   Temp 98 F (36.7 C) (Oral)   Ht 5\' 7"  (1.702 m)   Wt 247 lb (112 kg)   SpO2 95%   BMI 38.69 kg/m  General:  well developed, well nourished, in no apparent distress Heart: RRR, no bruits, no LE edema Lungs: clear to auscultation, no accessory muscle use Psych: well oriented with normal range of affect and appropriate judgment/insight  Essential hypertension - Plan: chlorthalidone (HYGROTON) 25 MG tablet  Anxiety with depression - Plan: escitalopram (LEXAPRO) 10 MG tablet  Orders as above. Cut down chlorthalidone dose from 25 mg daily to 12.5 mg daily.  Counseled on diet and  exercise.  Continue to monitor and we will follow-up with his home readings and symptoms at the next visit. Start Lexapro, stop Cymbalta.  Number given for counseling. F/u in 1 mo. The patient voiced understanding and agreement to the plan.  [place], DO 08/13/17  8:25 AM

## 2017-08-18 ENCOUNTER — Encounter: Payer: Self-pay | Admitting: Family Medicine

## 2017-08-18 ENCOUNTER — Other Ambulatory Visit: Payer: Self-pay | Admitting: Internal Medicine

## 2017-08-21 ENCOUNTER — Encounter: Payer: Self-pay | Admitting: Family Medicine

## 2017-08-21 ENCOUNTER — Other Ambulatory Visit: Payer: Self-pay | Admitting: Family Medicine

## 2017-08-21 DIAGNOSIS — I10 Essential (primary) hypertension: Secondary | ICD-10-CM

## 2017-08-26 ENCOUNTER — Ambulatory Visit: Payer: Self-pay | Admitting: *Deleted

## 2017-08-26 NOTE — Telephone Encounter (Signed)
Pt called with requesting an appointment for dizziness. He is not having problems walking. The room is not spinning. He did have an episode of diarrhea today but the dizziness has been going on for a few days. Denies dehydration or fever.  Was recently started on sertraline, which can have side effects from taking this med.  His last b/p was 150/80 today, hr 58.  Appointment scheduled per protocol. Advised to go to ED if experiencing increase in dizziness with other symptoms. Pt voiced understanding.  Reason for Disposition . [1] MILD dizziness (e.g., walking normally) AND [2] has NOT been evaluated by physician for this  (Exception: dizziness caused by heat exposure, sudden standing, or poor fluid intake)  Answer Assessment - Initial Assessment Questions 1. DESCRIPTION: "Describe your dizziness."     lightheaded 2. LIGHTHEADED: "Do you feel lightheaded?" (e.g., somewhat faint, woozy, weak upon standing)     Yes and just moving around 3. VERTIGO: "Do you feel like either you or the room is spinning or tilting?" (i.e. vertigo)     no 4. SEVERITY: "How bad is it?"  "Do you feel like you are going to faint?" "Can you stand and walk?"   - MILD - walking normally   - MODERATE - interferes with normal activities (e.g., work, school)    - SEVERE - unable to stand, requires support to walk, feels like passing out now.      mild 5. ONSET:  "When did the dizziness begin?"     The past few days and but more today 6. AGGRAVATING FACTORS: "Does anything make it worse?" (e.g., standing, change in head position)     no 7. HEART RATE: "Can you tell me your heart rate?" "How many beats in 15 seconds?"  (Note: not all patients can do this)       Around 73 8. CAUSE: "What do you think is causing the dizziness?"     Not sure 9. RECURRENT SYMPTOM: "Have you had dizziness before?" If so, ask: "When was the last time?" "What happened that time?"     Rarely  10. OTHER SYMPTOMS: "Do you have any other  symptoms?" (e.g., fever, chest pain, vomiting, diarrhea, bleeding)       Diarrhea today  Protocols used: DIZZINESS [facility]

## 2017-08-28 ENCOUNTER — Encounter: Payer: Self-pay | Admitting: Family Medicine

## 2017-08-28 ENCOUNTER — Ambulatory Visit: Payer: 59 | Admitting: Family Medicine

## 2017-08-28 VITALS — BP 144/76 | Temp 97.6°F | Ht 68.0 in | Wt 251.1 lb

## 2017-08-28 DIAGNOSIS — R42 Dizziness and giddiness: Secondary | ICD-10-CM

## 2017-08-28 LAB — COMPREHENSIVE METABOLIC PANEL
ALT: 33 U/L (ref 0–53)
AST: 25 U/L (ref 0–37)
Albumin: 4.2 g/dL (ref 3.5–5.2)
Alkaline Phosphatase: 61 U/L (ref 39–117)
BILIRUBIN TOTAL: 0.3 mg/dL (ref 0.2–1.2)
BUN: 20 mg/dL (ref 6–23)
CHLORIDE: 103 meq/L (ref 96–112)
CO2: 32 meq/L (ref 19–32)
CREATININE: 1.06 mg/dL (ref 0.40–1.50)
Calcium: 9.6 mg/dL (ref 8.4–10.5)
GFR: 72.21 mL/min (ref >60.00)
GLUCOSE: 117 mg/dL — AB (ref 70–99)
Potassium: 4.7 mEq/L (ref 3.5–5.1)
SODIUM: 140 meq/L (ref 135–145)
Total Protein: 6.5 g/dL (ref 6.0–8.3)

## 2017-08-28 LAB — T4, FREE: Free T4: 0.57 ng/dL — ABNORMAL LOW (ref 0.60–1.60)

## 2017-08-28 LAB — CBC
HCT: 40.3 % (ref 39.0–52.0)
Hemoglobin: 13.3 g/dL (ref 13.0–17.0)
MCHC: 33.1 g/dL (ref 30.0–36.0)
MCV: 87.1 fl (ref 78.0–100.0)
Platelets: 144 10*3/uL — ABNORMAL LOW (ref 150.0–400.0)
RBC: 4.62 Mil/uL (ref 4.22–5.81)
RDW: 14.9 % (ref 11.5–15.5)
WBC: 7.6 10*3/uL (ref 4.0–10.5)

## 2017-08-28 LAB — TSH: TSH: 2.41 u[IU]/mL (ref 0.35–4.50)

## 2017-08-28 NOTE — Patient Instructions (Addendum)
Give [place] 2-3 business days to get the results of your labs back.   Stay hydrated.  YouTube has good instructions on how to do the [employer].    Let us know if you need anything.  How to Perform the Epley Maneuver The Epley maneuver is an exercise that relieves symptoms of vertigo. Vertigo is the feeling that you or your surroundings are moving when they are not. When you feel vertigo, you may feel like the room is spinning and have trouble walking. Dizziness is a little different than vertigo. When you are dizzy, you may feel unsteady or light-headed. You can do this maneuver at home whenever you have symptoms of vertigo. You can do it up to 3 times a day until your symptoms go away. Even though the Epley maneuver may relieve your vertigo for a few weeks, it is possible that your symptoms will return. This maneuver relieves vertigo, but it does not relieve dizziness. What are the risks? If it is done correctly, the Epley maneuver is considered safe. Sometimes it can lead to dizziness or nausea that goes away after a short time. If you develop other symptoms, such as changes in vision, weakness, or numbness, stop doing the maneuver and call your health care provider. How to perform the Epley maneuver 1. Sit on the edge of a bed or table with your back straight and your legs extended or hanging over the edge of the bed or table. 2. Turn your head halfway toward the affected ear or side. 3. Lie backward quickly with your head turned until you are lying flat on your back. You may want to position a pillow under your shoulders. 4. Hold this position for 30 seconds. You may experience an attack of vertigo. This is normal. 5. Turn your head to the opposite direction until your unaffected ear is facing the floor. 6. Hold this position for 30 seconds. You may experience an attack of vertigo. This is normal. Hold this position until the vertigo stops. 7. Turn your whole body to the same side as your  head. Hold for another 30 seconds. 8. Sit back up. You can repeat this exercise up to 3 times a day. Follow these instructions at home:  After doing the Epley maneuver, you can return to your normal activities.  Ask your health care provider if there is anything you should do at home to prevent vertigo. He or she may recommend that you: ? Keep your head raised (elevated) with two or more pillows while you sleep. ? Do not sleep on the side of your affected ear. ? Get up slowly from bed. ? Avoid sudden movements during the day. ? Avoid extreme head movement, like looking up or bending over. Contact a health care provider if:  Your vertigo gets worse.  You have other symptoms, including: ? Nausea. ? Vomiting. ? Headache. Get help right away if:  You have vision changes.  You have a severe or worsening headache or neck pain.  You cannot stop vomiting.  You have new numbness or weakness in any part of your body. Summary  Vertigo is the feeling that you or your surroundings are moving when they are not.  The Epley maneuver is an exercise that relieves symptoms of vertigo.  If the Epley maneuver is done correctly, it is considered safe. You can do it up to 3 times a day. This information is not intended to replace advice given to you by your health care provider. Make  sure you discuss any questions you have with your health care provider. Document Released: 01/05/2013 Document Revised: 11/21/2015 Document Reviewed: 11/21/2015 Elsevier Interactive Patient Education  2017 [employer].

## 2017-08-28 NOTE — Progress Notes (Signed)
Pre visit review using our clinic review tool, if applicable. No additional management support is needed unless otherwise documented below in the visit note. 

## 2017-08-28 NOTE — Progress Notes (Signed)
Chief Complaint  Patient presents with  . Dizziness    Raymond Christensen is 77 y.o. pt here for dizziness.  Duration: 2 weeks Pass out? No Spinning? Yes- lasts for several minutes Recent illness/fever? No Headache? No Neurologic signs? No Change in PO intake? No  Denies chest pain, palpitations, or shortness of breath. Has been checking BP's-has been running 140-150's/ 80-90's over the past couple days.  Prior to that if they were normal.  He is compliant with medication.  ROS:  Neuro: As noted in HPI Eyes: No vision changes  Past Medical History:  Diagnosis Date  . Arthritis   . CAD (coronary artery disease)   . Depression   . Diabetes mellitus without complication ([place])   . Heart disease   . Hyperlipidemia   . Hypertension   . Thyroid disease     Family History  Problem Relation Age of Onset  . Cancer Neg Hx       BP (!) 144/76 (BP Location: Left Arm, Cuff Size: Large)   Temp 97.6 F (36.4 C) (Oral)   Ht 5\' 8"  (1.727 m)   Wt 251 lb 2 oz (113.9 kg)   SpO2 97%   BMI 38.18 kg/m  General: Awake, alert, appears stated age Eyes: PERRLA, EOMi Ears: Patent, TM's neg b/l Heart: RRR, no carotid bruits, 1+ pitting edema bilaterally up to knees Lungs: CTAB, no accessory muscle use MSK: 5/5 strength throughout, gait normal Neuro: No cerebellar signs, patellar reflex 2/4 b/l wo clonus, calcaneal reflex 0/4 b/l wo clonus, biceps reflex 1/4 b/l wo clonus; Dix-Hall-Pike negative on the right, positive on the left Psych: Age appropriate judgment and insight, normal mood and affect  Lightheadedness - Plan: EKG 12-Lead, CBC, Comprehensive metabolic panel, TSH, T4, free  Vertigo - Plan: EKG 12-Lead  EKG neg. Orthostatics neg. Will check labs. If neg, will tx for POTS. If no help at f/u, will refer to cards. Hold off on echo as they may do stress test. Epley Maneuver.  F/u as originally scheduled in early September.   Pt voiced understanding and agreement to the  plan.  [place], DO 08/28/17 9:41 AM

## 2017-08-29 ENCOUNTER — Other Ambulatory Visit: Payer: Self-pay | Admitting: Family Medicine

## 2017-08-29 DIAGNOSIS — E039 Hypothyroidism, unspecified: Secondary | ICD-10-CM

## 2017-09-04 ENCOUNTER — Other Ambulatory Visit: Payer: Self-pay | Admitting: Family Medicine

## 2017-09-04 ENCOUNTER — Other Ambulatory Visit (INDEPENDENT_AMBULATORY_CARE_PROVIDER_SITE_OTHER): Payer: 59

## 2017-09-04 ENCOUNTER — Encounter: Payer: Self-pay | Admitting: Family Medicine

## 2017-09-04 DIAGNOSIS — E039 Hypothyroidism, unspecified: Secondary | ICD-10-CM | POA: Diagnosis not present

## 2017-09-04 LAB — TSH: TSH: 4.09 u[IU]/mL (ref 0.35–4.50)

## 2017-09-04 LAB — T4, FREE: Free T4: 0.56 ng/dL — ABNORMAL LOW (ref 0.60–1.60)

## 2017-09-04 MED ORDER — LEVOTHYROXINE SODIUM 75 MCG PO TABS: 75.0000 ug | ORAL_TABLET | Freq: Every day | ORAL | 1 refills | Status: DC

## 2017-09-16 NOTE — Telephone Encounter (Signed)
I had increased his levothyroxine rather than Cytomel. Was this change lost? TY.

## 2017-09-17 ENCOUNTER — Ambulatory Visit: Payer: 59 | Admitting: Family Medicine

## 2017-09-19 ENCOUNTER — Telehealth: Payer: Self-pay | Admitting: Family Medicine

## 2017-09-19 ENCOUNTER — Encounter: Payer: Self-pay | Admitting: Medical

## 2017-09-19 ENCOUNTER — Other Ambulatory Visit: Payer: Self-pay | Admitting: Medical

## 2017-09-19 ENCOUNTER — Ambulatory Visit: Payer: 59 | Admitting: Medical

## 2017-09-19 VITALS — BP 145/70 | HR 75 | Temp 98.1°F | Ht 68.0 in | Wt 249.2 lb

## 2017-09-19 DIAGNOSIS — R42 Dizziness and giddiness: Secondary | ICD-10-CM

## 2017-09-19 DIAGNOSIS — R5383 Other fatigue: Secondary | ICD-10-CM | POA: Diagnosis not present

## 2017-09-19 DIAGNOSIS — J309 Allergic rhinitis, unspecified: Secondary | ICD-10-CM | POA: Diagnosis not present

## 2017-09-19 LAB — VITAMIN B12: Vitamin B-12: 788 pg/mL (ref 211–911)

## 2017-09-19 LAB — VITAMIN D 25 HYDROXY (VIT D DEFICIENCY, FRACTURES): VITD: 74.05 ng/mL (ref 30.00–100.00)

## 2017-09-19 MED ORDER — LEVOCETIRIZINE DIHYDROCHLORIDE 5 MG PO TABS: 5.0000 mg | ORAL_TABLET | Freq: Every evening | ORAL | 3 refills | Status: DC

## 2017-09-19 MED ORDER — HYDROCHLOROTHIAZIDE 25 MG PO TABS: 25.0000 mg | ORAL_TABLET | Freq: Every day | ORAL | 1 refills | Status: DC

## 2017-09-19 MED ORDER — SERTRALINE HCL 50 MG PO TABS: 50.0000 mg | ORAL_TABLET | Freq: Every day | ORAL | 3 refills | Status: DC

## 2017-09-19 MED ORDER — LEVOTHYROXINE SODIUM 75 MCG PO TABS: 75.0000 ug | ORAL_TABLET | Freq: Every day | ORAL | 1 refills | Status: DC

## 2017-09-19 MED ORDER — FLUTICASONE PROPIONATE 50 MCG/ACT NA SUSP: 2.0000 | Freq: Every day | NASAL | 1 refills | Status: DC

## 2017-09-19 NOTE — Telephone Encounter (Signed)
Patient was in the office today and stated he needs a refill on HCTZ 25 mg takes once daily.  This medication is not on his current list.  But notes states he is compliant taking it daily  Advise if ok to add to list//send into [facility] if ok

## 2017-09-19 NOTE — Patient Instructions (Signed)
For your history of recent fatigue, I did place labs to check her B12, B1 and vitamin D level.  Consider getting CBC and metabolic panel today but that after discussion I saw that the have been done relatively recently.  Recently increased dose of thyroid medication but I think it would be too early to recheck those levels.  That will probably be done on follow-up with Dr.Wendling next month.  Continue modafinil.  It appears that your mood might be slightly decreased.  Dr. Nani Ravens also has you on sertraline.  You had mentioned that you not sure if this is having any effect on you.  I was considering a medication called Effexor 37.5/low dose.  You might touch base with Dr. Nani Ravens and get his opinion on this.  He did seem to have some allergic rhinitis type symptoms.  I am prescribing Flonase and Xyzal.  Some of your lightheaded sensation could be allergy related.  Regarding your lightheaded sensation, he had completely normal neurologic exam.  In light of negative neurologic exam and no associated neurologic signs or symptoms decided not to do imaging studies.  If your symptoms were to change as we discussed then would recommend ED evaluation.    Follow-up in 3 to 4 weeks with PCP or as needed.

## 2017-09-19 NOTE — Telephone Encounter (Signed)
Sent in

## 2017-09-19 NOTE — Progress Notes (Signed)
Subjective:    Patient ID: Raymond Christensen, male    DOB: [birth year], 77 y.o.   MRN: [id number]  HPI  Pt in for follow up.  His bp over last week has been very well controlled. No cardiac or obvious neurologic signs or symptoms.  He does report some recent fatigue(sleepiness).  Pt had increased his thyroid dose just 2 weeks ago. He states seems to have not impacted his energy level significantly. Maybe slight increased energy. Pt has been on modafanil for fatigue in past as well.  On review pt declines any correlation with light headed sensation occurring with low or high bp.   Pt also states comes and goes light headed sensation on and off for 1.5 months. No vertigo and no loss of balance. No nausea or vomiting. No significant ha. He has some nasal congestion for long time. He states maybe has little more congestion that usual. Feels pnd all the time. No nasal sprays other than saline.   Review of Systems  Constitutional: Negative for chills, fatigue and fever.  HENT: Positive for congestion and postnasal drip. Negative for sinus pressure, sinus pain and sore throat.   Respiratory: Negative for cough, chest tightness, shortness of breath and wheezing.   Cardiovascular: Negative for chest pain and palpitations.  Gastrointestinal: Negative for abdominal distention, abdominal pain, constipation, nausea and vomiting.  Musculoskeletal: Negative for gait problem.  Skin: Negative for rash.  Neurological: Negative for dizziness, syncope, weakness, numbness and headaches.       Light headed senstion.  Hematological: Negative for adenopathy. Does not bruise/bleed easily.  Psychiatric/Behavioral: Negative for agitation, behavioral problems, self-injury and suicidal ideas. The patient is not nervous/anxious.        Pt states wife reports he is rude sometimes. He thinks not depressed. Speculates maybe.    Past Medical History:  Diagnosis Date  . Arthritis   . CAD (coronary artery disease)     . Depression   . Diabetes mellitus without complication ([place])   . Heart disease   . Hyperlipidemia   . Hypertension   . Thyroid disease      Social History   Socioeconomic History  . Marital status: Married    Spouse name: Not on file  . Number of children: Not on file  . Years of education: Not on file  . Highest education level: Not on file  Occupational History  . Not on file  Social Needs  . Financial resource strain: Not on file  . Food insecurity:    Worry: Not on file    Inability: Not on file  . Transportation needs:    Medical: Not on file    Non-medical: Not on file  Tobacco Use  . Smoking status: Former Smoker    Types: Cigarettes  . Smokeless tobacco: Never Used  Substance and Sexual Activity  . Alcohol use: Yes  . Drug use: No  . Sexual activity: Never  Lifestyle  . Physical activity:    Days per week: Not on file    Minutes per session: Not on file  . Stress: Not on file  Relationships  . Social connections:    Talks on phone: Not on file    Gets together: Not on file    Attends religious service: Not on file    Active member of club or organization: Not on file    Attends meetings of clubs or organizations: Not on file    Relationship status: Not on file  . Intimate partner  violence:    Fear of current or ex partner: Not on file    Emotionally abused: Not on file    Physically abused: Not on file    Forced sexual activity: Not on file  Other Topics Concern  . Not on file  Social History Narrative  . Not on file    Past Surgical History:  Procedure Laterality Date  . BYPASS GRAFT  2001  . CAROTID ARTERY ANGIOPLASTY    . TOTAL HIP ARTHROPLASTY  1994, 1995    Family History  Problem Relation Age of Onset  . Cancer Neg Hx     Allergies  Allergen Reactions  . Sulfa Antibiotics Rash    Questionable, he developed a diffuse rash 2 days after stopping Bactrim    Current Outpatient Medications on File Prior to Visit  Medication Sig  Dispense Refill  . aspirin EC 81 MG tablet Take by mouth.    . Cholecalciferol (VITAMIN D3) 10000 units TABS Take by mouth.    . Cyanocobalamin (VITAMIN B-12 PO) Take by mouth.    . finasteride (PROSCAR) 5 MG tablet Take 1 tablet (5 mg total) by mouth daily. 90 tablet 1  . folic acid (FOLVITE) 941 MCG tablet Take 400 mcg by mouth daily.    Marland Kitchen levothyroxine (SYNTHROID, LEVOTHROID) 75 MCG tablet Take 1 tablet (75 mcg total) by mouth daily. 90 tablet 1  . liothyronine (CYTOMEL) 5 MCG tablet     . metoprolol succinate (TOPROL-XL) 50 MG 24 hr tablet Take 1 tablet (50 mg total) by mouth daily. Take with or immediately following a meal. 90 tablet 0  . modafinil (PROVIGIL) 200 MG tablet     . omega-3 acid ethyl esters (LOVAZA) 1 g capsule Take 2 g by mouth 2 (two) times daily.     . ramipril (ALTACE) 10 MG capsule Take 1 capsule (10 mg total) by mouth 2 (two) times daily. 90 capsule 3  . simvastatin (ZOCOR) 40 MG tablet Take by mouth.    . Zinc 50 MG TABS Take by mouth.    . ezetimibe (ZETIA) 10 MG tablet Take 10 mg by mouth.     No current facility-administered medications on file prior to visit.     BP (!) 142/72 (BP Location: Left Arm, Patient Position: Sitting, Cuff Size: Large)   Pulse 75   Temp 98.1 F (36.7 C) (Oral)   Ht 5\' 8"  (1.727 m)   Wt 249 lb 4 oz (113.1 kg)   SpO2 97%   BMI 37.90 kg/m    Past Medical History:  Diagnosis Date  . Arthritis   . CAD (coronary artery disease)   . Depression   . Diabetes mellitus without complication ([place])   . Heart disease   . Hyperlipidemia   . Hypertension   . Thyroid disease      Social History   Socioeconomic History  . Marital status: Married    Spouse name: Not on file  . Number of children: Not on file  . Years of education: Not on file  . Highest education level: Not on file  Occupational History  . Not on file  Social Needs  . Financial resource strain: Not on file  . Food insecurity:    Worry: Not on file     Inability: Not on file  . Transportation needs:    Medical: Not on file    Non-medical: Not on file  Tobacco Use  . Smoking status: Former Smoker    Types: Cigarettes  .  Smokeless tobacco: Never Used  Substance and Sexual Activity  . Alcohol use: Yes  . Drug use: No  . Sexual activity: Never  Lifestyle  . Physical activity:    Days per week: Not on file    Minutes per session: Not on file  . Stress: Not on file  Relationships  . Social connections:    Talks on phone: Not on file    Gets together: Not on file    Attends religious service: Not on file    Active member of club or organization: Not on file    Attends meetings of clubs or organizations: Not on file    Relationship status: Not on file  . Intimate partner violence:    Fear of current or ex partner: Not on file    Emotionally abused: Not on file    Physically abused: Not on file    Forced sexual activity: Not on file  Other Topics Concern  . Not on file  Social History Narrative  . Not on file    Past Surgical History:  Procedure Laterality Date  . BYPASS GRAFT  2001  . CAROTID ARTERY ANGIOPLASTY    . TOTAL HIP ARTHROPLASTY  1994, 1995    Family History  Problem Relation Age of Onset  . Cancer Neg Hx     Allergies  Allergen Reactions  . Sulfa Antibiotics Rash    Questionable, he developed a diffuse rash 2 days after stopping Bactrim    Current Outpatient Medications on File Prior to Visit  Medication Sig Dispense Refill  . aspirin EC 81 MG tablet Take by mouth.    . Cholecalciferol (VITAMIN D3) 10000 units TABS Take by mouth.    . Cyanocobalamin (VITAMIN B-12 PO) Take by mouth.    . finasteride (PROSCAR) 5 MG tablet Take 1 tablet (5 mg total) by mouth daily. 90 tablet 1  . folic acid (FOLVITE) 254 MCG tablet Take 400 mcg by mouth daily.    Marland Kitchen levothyroxine (SYNTHROID, LEVOTHROID) 75 MCG tablet Take 1 tablet (75 mcg total) by mouth daily. 90 tablet 1  . liothyronine (CYTOMEL) 5 MCG tablet     .  metoprolol succinate (TOPROL-XL) 50 MG 24 hr tablet Take 1 tablet (50 mg total) by mouth daily. Take with or immediately following a meal. 90 tablet 0  . modafinil (PROVIGIL) 200 MG tablet     . omega-3 acid ethyl esters (LOVAZA) 1 g capsule Take 2 g by mouth 2 (two) times daily.     . ramipril (ALTACE) 10 MG capsule Take 1 capsule (10 mg total) by mouth 2 (two) times daily. 90 capsule 3  . simvastatin (ZOCOR) 40 MG tablet Take by mouth.    . Zinc 50 MG TABS Take by mouth.    . ezetimibe (ZETIA) 10 MG tablet Take 10 mg by mouth.     No current facility-administered medications on file prior to visit.     BP (!) 142/72 (BP Location: Left Arm, Patient Position: Sitting, Cuff Size: Large)   Pulse 75   Temp 98.1 F (36.7 C) (Oral)   Ht 5\' 8"  (1.727 m)   Wt 249 lb 4 oz (113.1 kg)   SpO2 97%   BMI 37.90 kg/m       Objective:   Physical Exam  General Mental Status- Alert. General Appearance- Not in acute distress.   Skin General: Color- Normal Color. Moisture- Normal Moisture.  Neck Carotid Arteries- Normal color. Moisture- Normal Moisture. No carotid bruits. No  JVD.  Chest and Lung Exam Auscultation: Breath Sounds:-Normal.  Cardiovascular Auscultation:Rythm- Regular. Murmurs & Other Heart Sounds:Auscultation of the heart reveals- No Murmurs.  Abdomen Inspection:-Inspeection Normal. Palpation/Percussion:Note:No mass. Palpation and Percussion of the abdomen reveal- Non Tender, Non Distended + BS, no rebound or guarding.   Neurologic Cranial Nerve exam:- CN III-XII intact(No nystagmus), symmetric smile. Drift Test:- No drift. Romberg Exam:- Negative.  Heal to Toe Gait exam:-Normal. Finger to Nose:- Normal/Intact Strength:- 5/5 equal and symmetric strength both upper and lower extremities.   HEENT Head- Normal. Ear Auditory Canal - Left- Normal. Right - Normal.Tympanic Membrane- Left- Normal. Right- Normal. Eye Sclera/Conjunctiva- Left- Normal. Right- Normal. Nose  & Sinuses Nasal Mucosa- Left-  Boggy and Congested. Right-  Boggy and  Congested.Bilateral no maxillary and no  frontal sinus pressure. Mouth & Throat Lips: Upper Lip- Normal: no dryness, cracking, pallor, cyanosis, or vesicular eruption. Lower Lip-Normal: no dryness, cracking, pallor, cyanosis or vesicular eruption. Buccal Mucosa- Bilateral- No Aphthous ulcers. Oropharynx- No Discharge or Erythema. +pnd. Tonsils: Characteristics- Bilateral- No Erythema or Congestion. Size/Enlargement- Bilateral- No enlargement. Discharge- bilateral-None.      Assessment & Plan:  For your history of recent fatigue, I did place labs to check her B12, B1 and vitamin D level.  Consider getting CBC and metabolic panel today but that after discussion I saw that the have been done relatively recently.  Recently increased dose of thyroid medication but I think it would be too early to recheck those levels.  That will probably be done on follow-up with Dr.Wendling next month.  Continue modafinil.  It appears that your mood might be slightly decreased.  Dr. Nani Ravens also has you on sertraline.  You had mentioned that you not sure if this is having any effect on you.  I was considering a medication called Effexor 37.5/low dose.  You might touch base with Dr. Nani Ravens and get his opinion on this.  He did seem to have some allergic rhinitis type symptoms.  I am prescribing Flonase and Xyzal.  Some of your lightheaded sensation could be allergy related.  Regarding your lightheaded sensation, he had completely normal neurologic exam.  In light of negative neurologic exam and no associated neurologic signs or symptoms decided not to do imaging studies.  If your symptoms were to change as we discussed then would recommend ED evaluation.   Follow up in 3-4 weeks with pcp or as needed  [employer], PA-C

## 2017-09-19 NOTE — Telephone Encounter (Signed)
OK to send.

## 2017-09-24 LAB — VITAMIN B1: Vitamin B1 (Thiamine): 15 nmol/L (ref 8–30)

## 2017-10-15 ENCOUNTER — Encounter: Payer: Self-pay | Admitting: Family Medicine

## 2017-10-15 ENCOUNTER — Ambulatory Visit: Payer: 59 | Admitting: Family Medicine

## 2017-10-15 VITALS — BP 118/68 | HR 60 | Temp 98.0°F | Ht 68.0 in | Wt 254.4 lb

## 2017-10-15 DIAGNOSIS — E119 Type 2 diabetes mellitus without complications: Secondary | ICD-10-CM | POA: Diagnosis not present

## 2017-10-15 DIAGNOSIS — I10 Essential (primary) hypertension: Secondary | ICD-10-CM

## 2017-10-15 DIAGNOSIS — E039 Hypothyroidism, unspecified: Secondary | ICD-10-CM | POA: Insufficient documentation

## 2017-10-15 DIAGNOSIS — R413 Other amnesia: Secondary | ICD-10-CM | POA: Diagnosis not present

## 2017-10-15 DIAGNOSIS — F418 Other specified anxiety disorders: Secondary | ICD-10-CM | POA: Diagnosis not present

## 2017-10-15 HISTORY — DX: Hypothyroidism, unspecified: E03.9

## 2017-10-15 LAB — TSH: TSH: 1.69 u[IU]/mL (ref 0.35–4.50)

## 2017-10-15 LAB — HEMOGLOBIN A1C: Hgb A1c MFr Bld: 6.4 % (ref 4.6–6.5)

## 2017-10-15 LAB — T4, FREE: Free T4: 0.69 ng/dL (ref 0.60–1.60)

## 2017-10-15 MED ORDER — VENLAFAXINE HCL ER 75 MG PO CP24: 75.0000 mg | ORAL_CAPSULE | Freq: Every day | ORAL | 2 refills | Status: DC

## 2017-10-15 MED ORDER — HYDROCHLOROTHIAZIDE 12.5 MG PO TABS: 12.5000 mg | ORAL_TABLET | Freq: Every day | ORAL | 3 refills | Status: DC

## 2017-10-15 NOTE — Progress Notes (Signed)
Pre visit review using our clinic review tool, if applicable. No additional management support is needed unless otherwise documented below in the visit note. 

## 2017-10-15 NOTE — Patient Instructions (Addendum)
Give [place] 2-3 business days to get the results of your labs back.   Mind the salt intake, stay active, elevate your legs, consider compression stockings.  Aim to do some physical exertion for 150 minutes per week. This is typically divided into 5 days per week, 30 minutes per day. The activity should be enough to get your heart rate up. Anything is better than nothing if you have time constraints.  Continue checking blood pressure.   Check blood sugar when you get sweaty and light headedness.   Let us know if you need anything.

## 2017-10-15 NOTE — Progress Notes (Signed)
Chief Complaint  Patient presents with  . Follow-up    Subjective Raymond Christensen is a 77 y.o. male who presents for hypertension follow up. Here w his wife. He does monitor home blood pressures. Blood pressures ranging from 110-130's/60-70's on average. He is compliant with medications- HCTZ 25 mg/d, Altace 10 mg/d, Toprol XL 50 mg/d. Patient has these side effects of medication: none He is not adhering to a healthy diet overall. Current exercise: none  Hx of dep/anxiety. Currently on Zoloft and not noticing a difference. No SI or HI. Having memory issues. Has had extensive workup w Neuro that was unremarkable. +famhx of some sort of dementia, no autopsy due to his family's faith.   +swelling in legs.    Past Medical History:  Diagnosis Date  . Arthritis   . CAD (coronary artery disease)   . Depression   . Diabetes mellitus without complication ([place])   . Heart disease   . Hyperlipidemia   . Hypertension   . Thyroid disease    Review of Systems Cardiovascular: no chest pain Respiratory:  no shortness of breath  Exam BP 118/68 (BP Location: Left Arm, Patient Position: Sitting, Cuff Size: Large)   Pulse 60   Temp 98 F (36.7 C) (Oral)   Ht 5\' 8"  (1.727 m)   Wt 254 lb 6 oz (115.4 kg)   SpO2 96%   BMI 38.68 kg/m  General:  well developed, well nourished, in no apparent distress Heart: RRR, no bruits, +1+ pitting b/l LE edema Lungs: clear to auscultation, no accessory muscle use Psych: well oriented with normal range of affect and appropriate judgment/insight  Anxiety with depression - Plan: venlafaxine XR (EFFEXOR-XR) 75 MG 24 hr capsule  Essential hypertension - Plan: hydrochlorothiazide (HYDRODIURIL) 12.5 MG tablet  Memory changes  Hypothyroidism, unspecified type - Plan: TSH, T4, free  Type 2 diabetes mellitus without complication, without long-term current use of insulin (HCC) - Plan: Hemoglobin A1c  Orders as above. Stop Zoloft, start Effexor.  Decrease  dose of HCTZ. Would like him to ck BPs at home. Also ck sugars when he has "low's" to see if that is contributing. Counseled on diet and exercise. Ck labs today as well.  F/u in 6 weeks. The patient voiced understanding and agreement to the plan.  [place], DO 10/15/17  9:35 AM

## 2017-10-31 ENCOUNTER — Other Ambulatory Visit: Payer: Self-pay | Admitting: Family Medicine

## 2017-10-31 DIAGNOSIS — I10 Essential (primary) hypertension: Secondary | ICD-10-CM

## 2017-10-31 NOTE — Progress Notes (Signed)
Pt saw cards and was told to cut metoprolol in half. If this is helpful, given that it is a long acting release, we will change to 25 mg XL.

## 2017-11-07 ENCOUNTER — Encounter: Payer: Self-pay | Admitting: Family Medicine

## 2017-11-26 ENCOUNTER — Encounter: Payer: Self-pay | Admitting: Family Medicine

## 2017-11-26 ENCOUNTER — Ambulatory Visit: Payer: 59 | Admitting: Family Medicine

## 2017-11-26 VITALS — BP 142/72 | HR 69 | Temp 98.2°F | Ht 68.0 in | Wt 254.0 lb

## 2017-11-26 DIAGNOSIS — F418 Other specified anxiety disorders: Secondary | ICD-10-CM | POA: Diagnosis not present

## 2017-11-26 DIAGNOSIS — I10 Essential (primary) hypertension: Secondary | ICD-10-CM

## 2017-11-26 MED ORDER — DULOXETINE HCL 60 MG PO CPEP: 60.0000 mg | ORAL_CAPSULE | Freq: Every day | ORAL | 3 refills | Status: DC

## 2017-11-26 MED ORDER — CHLORTHALIDONE 25 MG PO TABS: 12.5000 mg | ORAL_TABLET | Freq: Every day | ORAL | 1 refills | Status: DC

## 2017-11-26 NOTE — Progress Notes (Signed)
Chief Complaint  Patient presents with  . Follow-up    Subjective Raymond Christensen presents for f/u anxiety/depression.  He is currently being treated with Effexor 75 mg/d.  Reports no improvement since treatment. No thoughts of harming self or others. No self-medication with alcohol, prescription drugs or illicit drugs. Pt is not following with a counselor/psychologist.  Hypertension Patient presents for hypertension follow up. He does monitor home blood pressures. Blood pressures ranging on average from 140's/70-80's. He is compliant with medications. Patient has these side effects of medication: none He is sometimes adhering to a healthy diet overall. Exercise: some walking   ROS Psych: No homicidal or suicidal thoughts Cardiac: No CP  Past Medical History:  Diagnosis Date  . Arthritis   . CAD (coronary artery disease)   . Depression   . Diabetes mellitus without complication ([place])   . Heart disease   . Hyperlipidemia   . Hypertension   . Thyroid disease      Exam BP (!) 142/72 (BP Location: Right Arm, Patient Position: Sitting, Cuff Size: Large)   Pulse 69   Temp 98.2 F (36.8 C) (Oral)   Ht 5\' 8"  (1.727 m)   Wt 254 lb (115.2 kg)   SpO2 93%   BMI 38.62 kg/m  General:  well developed, well nourished, in no apparent distress Neck: neck supple without adenopathy, thyromegaly, or masses Lungs:  clear to auscultation, breath sounds equal bilaterally, no respiratory distress Cardio:  regular rate and rhythm without murmurs, heart sounds without clicks or rubs Psych: well oriented with normal range of affect and age-appropriate judgement/insight, alert and oriented x4.  Assessment and Plan  Anxiety with depression - Plan: DULoxetine (CYMBALTA) 60 MG capsule  Essential hypertension - Plan: Basic metabolic panel  Start Cymbalta, strongly encouraged him to consider counseling. Counseled on diet and exercise.  Stop metoprolol and hydrochlorothiazide, start  chlorthalidone 12.5 mg daily. Patient's wife is very concerned about his memory.  The patient addresses he does not have the memory he used to, but does not wish to pursue any further work-up or treatment at this time. F/u in 1 mo, 1 week for BMP. The patient voiced understanding and agreement to the plan.  [place], DO 11/26/17 12:06 PM

## 2017-11-26 NOTE — Addendum Note (Signed)
Addended by: Ames Coupe on: 11/26/2017 08:41 AM   Modules accepted: Orders

## 2017-11-26 NOTE — Progress Notes (Signed)
Pre visit review using our clinic review tool, if applicable. No additional management support is needed unless otherwise documented below in the visit note. 

## 2017-11-26 NOTE — Patient Instructions (Signed)
Continue checking your blood pressures at home.  Aim to do some physical exertion for 150 minutes per week. This is typically divided into 5 days per week, 30 minutes per day. The activity should be enough to get your heart rate up. Anything is better than nothing if you have time constraints.  Keep the diet clean.  Please consider counseling. Contact [phone] to schedule an appointment or inquire about cost/insurance coverage.  Let us know if you need anything.

## 2017-11-27 ENCOUNTER — Ambulatory Visit: Payer: Self-pay

## 2017-11-27 ENCOUNTER — Other Ambulatory Visit: Payer: Self-pay | Admitting: Family Medicine

## 2017-11-27 NOTE — Telephone Encounter (Signed)
Chlorthalidone replaces HTCZ and metoprolol. Cymbalta replaces Effexor/venlafaxine. Sorry for confusion.

## 2017-11-27 NOTE — Telephone Encounter (Signed)
Called informed of medication instructions. The patient verbalized undersanding.

## 2017-11-27 NOTE — Telephone Encounter (Signed)
Returned call to pt.  Reported he was seen in office yesterday, and was advised to start Chlorthalidone and Duloxetine, and to stop 2 other medications, but couldn't remember which ones to stop.  Informed pt., after reviewing the progress note per Dr. Nani Ravens, was advised to stop Metoprolol and HCTZ.  The pt. verb. understanding.  The pt. questioned if the Duloxetine should replace the Venlafaxine?  Advised will send message to Dr. Nani Ravens, as this was not made clear in the progress note.  Advised that per AVS, the medications are including Duloxetine, Chlorthalidone, and Venlafaxine.  Advised pt. that a nurse will call back with recommendations per Dr. Nani Ravens.  Verb. Understanding.       Reason for Disposition . Caller has NON-URGENT medication question about med that PCP prescribed and triager unable to answer question  Answer Assessment - Initial Assessment Questions 1. SYMPTOMS: "Do you have any symptoms?"     no 2. SEVERITY: If symptoms are present, ask "Are they mild, moderate or severe?"     N/A  Protocols used: MEDICATION QUESTION CALL-A-AH  Message from Conception Chancy, NT sent at 11/27/2017 7:59 AM EST   Patient is calling and was seen on 11/26/17 and was prescribed chlorthalidone (HYGROTON) 25 MG tablet and DULoxetine (CYMBALTA) 60 MG capsule. He would like to know which ones he is supposed to stop since taking in place of these.

## 2017-12-03 ENCOUNTER — Other Ambulatory Visit (INDEPENDENT_AMBULATORY_CARE_PROVIDER_SITE_OTHER): Payer: 59

## 2017-12-03 DIAGNOSIS — I10 Essential (primary) hypertension: Secondary | ICD-10-CM | POA: Diagnosis not present

## 2017-12-03 LAB — BASIC METABOLIC PANEL
BUN: 19 mg/dL (ref 6–23)
CALCIUM: 9.6 mg/dL (ref 8.4–10.5)
CO2: 33 mEq/L — ABNORMAL HIGH (ref 19–32)
Chloride: 101 mEq/L (ref 96–112)
Creatinine, Ser: 0.97 mg/dL (ref 0.40–1.50)
GFR: 79.94 mL/min (ref >60.00)
Glucose, Bld: 140 mg/dL — ABNORMAL HIGH (ref 70–99)
POTASSIUM: 4.7 meq/L (ref 3.5–5.1)
SODIUM: 140 meq/L (ref 135–145)

## 2017-12-26 ENCOUNTER — Encounter: Payer: Self-pay | Admitting: Family Medicine

## 2017-12-26 ENCOUNTER — Ambulatory Visit: Payer: 59 | Admitting: Family Medicine

## 2017-12-26 VITALS — BP 132/78 | HR 74 | Temp 97.6°F | Ht 68.0 in | Wt 250.2 lb

## 2017-12-26 DIAGNOSIS — R413 Other amnesia: Secondary | ICD-10-CM

## 2017-12-26 DIAGNOSIS — F418 Other specified anxiety disorders: Secondary | ICD-10-CM | POA: Diagnosis not present

## 2017-12-26 DIAGNOSIS — D1721 Benign lipomatous neoplasm of skin and subcutaneous tissue of right arm: Secondary | ICD-10-CM | POA: Diagnosis not present

## 2017-12-26 MED ORDER — DULOXETINE HCL 30 MG PO CPEP: ORAL_CAPSULE | ORAL | 3 refills | Status: DC

## 2017-12-26 NOTE — Patient Instructions (Addendum)
Call your insurance company and find out how much it would cost (Tdap).   Please consider counseling. Contact [phone] to schedule an appointment or inquire about cost/insurance coverage.  Let me know if you want your lipoma removed. We don't have to do anything though.   If you do not hear anything about your referral in the next 1-2 weeks, call our office and ask for an update.  Cymbalta- take 60 mg in the morning and 30 mg in the evening.   Let us know if you need anything.

## 2017-12-26 NOTE — Progress Notes (Signed)
Chief Complaint  Patient presents with  . Follow-up    Subjective Raymond Christensen presents for f/u anxiety/depression.  Here with his wife.  He is currently being treated with Cymbalta 60 mg/d.  Reports 65-70% improvement since treatment. No thoughts of harming self or others. No self-medication with alcohol, prescription drugs or illicit drugs. Pt is not following with a counselor/psychologist.  He has noticed a lump in his right upper extremity.  It does not bother him usually, however his wife thinks it is growing.  There is nothing draining from it, it is not painful, and is not itching.  No new topicals.  He has a history of memory issues.  Lab work has been unremarkable.  I have tried suggesting a formal evaluation given his memory issues.  They do not seem to have improved with his improved mood.  ROS Psych: No homicidal or suicidal thoughts Skin: +mass on arm  Past Medical History:  Diagnosis Date  . Arthritis   . CAD (coronary artery disease)   . Depression   . Diabetes mellitus without complication ([place])   . Heart disease   . Hyperlipidemia   . Hypertension   . Thyroid disease      Exam BP 132/78 (BP Location: Right Arm, Patient Position: Sitting, Cuff Size: Large)   Pulse 74   Temp 97.6 F (36.4 C) (Oral)   Ht 5\' 8"  (1.727 m)   Wt 250 lb 4 oz (113.5 kg)   SpO2 98%   BMI 38.05 kg/m  General:  well developed, well nourished, in no apparent distress Lungs: No accessory muscle use Skin: 3 x 3.5 cm lesion on RUE Psych: well oriented with normal range of affect and age-appropriate judgement/insight, alert and oriented x4.  Assessment and Plan  Anxiety with depression - Plan: DULoxetine (CYMBALTA) 30 MG capsule  Lipoma of right upper extremity  Memory changes - Plan: Ambulatory referral to Psychology  #1- increase Cymbalta daily dosage, 60 mg in the morning and 30 mg in the evening.  #4 counseling given.  He states that he will look into this. #2-  watchful waiting, we can remove or send to dermatology if it starts becoming bothersome. #3- Refer to neuropsych for evaluation. He or his wife are also supposed to contact insurance company to see about any out-of-pocket cost with Tdap vaccine. F/u in 6 weeks. The patient voiced understanding and agreement to the plan.  [place], DO 12/26/17 10:20 AM

## 2018-01-05 NOTE — Progress Notes (Signed)
Subjective:   Darrow Barreiro is a 77 y.o. male who presents for an Initial Medicare Annual Wellness Visit.  Review of Systems No ROS.  Medicare Wellness Visit. Additional risk factors are reflected in the social history. Cardiac Risk Factors include: advanced age (>60men, >46 women);diabetes mellitus;dyslipidemia;hypertension;male gender;obesity (BMI >30kg/m2) Sleep patterns:  Waking more frequently to urinate for the past week.  Home Safety/Smoke Alarms: Feels safe in home. Smoke alarms in place. Lives with wife in one story home. Walk-in shower.   Male:   [facility]S- will discuss with PCP . Last 01/2008 with 10 yr recall. PSA- No results found for: PSA  Eye- UTD yearly per pt.   Objective:    Today's Vitals   01/12/18 1528  BP: (!) 142/80  Pulse: 73  SpO2: 95%  Weight: 254 lb (115.2 kg)  Height: 5\' 8"  (1.727 m)   Body mass index is 38.62 kg/m.  Advanced Directives 01/12/2018 03/18/2017 01/04/2015 08/29/2014  Does Patient Have a Medical Advance Directive? Yes Yes No No  Type of Paramedic of [place];Living will - - -  Does patient want to make changes to medical advance directive? - No - Patient declined - -  Copy of [place] in Chart? Yes - validated most recent copy scanned in chart (See row information) - - -  Would patient like information on creating a medical advance directive? - - No - patient declined information -    Current Medications (verified) Outpatient Encounter Medications as of 01/12/2018  Medication Sig  . aspirin EC 81 MG tablet Take by mouth.  . chlorthalidone (HYGROTON) 25 MG tablet Take 0.5 tablets (12.5 mg total) by mouth daily.  . Cholecalciferol (VITAMIN D3) 10000 units TABS Take by mouth.  . Cyanocobalamin (VITAMIN B-12 PO) Take by mouth.  . DULoxetine (CYMBALTA) 30 MG capsule Take in the evening after the 60 mg in the morning.  . DULoxetine (CYMBALTA) 60 MG capsule Take 1 capsule (60 mg total) by mouth  daily.  . finasteride (PROSCAR) 5 MG tablet Take 1 tablet (5 mg total) by mouth daily.  . fluticasone (FLONASE) 50 MCG/ACT nasal spray Place 2 sprays into both nostrils daily.  . folic acid (FOLVITE) 433 MCG tablet Take 400 mcg by mouth daily.  Marland Kitchen levocetirizine (XYZAL) 5 MG tablet Take 1 tablet (5 mg total) by mouth every evening.  Marland Kitchen levothyroxine (SYNTHROID, LEVOTHROID) 75 MCG tablet Take 1 tablet (75 mcg total) by mouth daily.  Marland Kitchen liothyronine (CYTOMEL) 5 MCG tablet   . modafinil (PROVIGIL) 200 MG tablet   . omega-3 acid ethyl esters (LOVAZA) 1 g capsule Take 2 g by mouth 2 (two) times daily.   . ramipril (ALTACE) 10 MG capsule Take 1 capsule (10 mg total) by mouth 2 (two) times daily.  . simvastatin (ZOCOR) 40 MG tablet Take by mouth.  . Zinc 50 MG TABS Take by mouth.  . ezetimibe (ZETIA) 10 MG tablet Take 10 mg by mouth.   No facility-administered encounter medications on file as of 01/12/2018.     Allergies (verified) Sulfa antibiotics   History: Past Medical History:  Diagnosis Date  . Arthritis   . CAD (coronary artery disease)   . Depression   . Diabetes mellitus without complication ([place])   . Heart disease   . Hyperlipidemia   . Hypertension   . Thyroid disease    Past Surgical History:  Procedure Laterality Date  . BYPASS GRAFT  2001  . CAROTID ARTERY ANGIOPLASTY    .  TOTAL HIP ARTHROPLASTY  1994, 1995   Family History  Problem Relation Age of Onset  . Cancer Neg Hx    Social History   Socioeconomic History  . Marital status: Married    Spouse name: Not on file  . Number of children: Not on file  . Years of education: Not on file  . Highest education level: Not on file  Occupational History  . Not on file  Social Needs  . Financial resource strain: Not on file  . Food insecurity:    Worry: Not on file    Inability: Not on file  . Transportation needs:    Medical: Not on file    Non-medical: Not on file  Tobacco Use  . Smoking status: Former Smoker     Types: Cigarettes  . Smokeless tobacco: Never Used  Substance and Sexual Activity  . Alcohol use: Yes    Comment: twice per week  . Drug use: No  . Sexual activity: Yes  Lifestyle  . Physical activity:    Days per week: Not on file    Minutes per session: Not on file  . Stress: Not on file  Relationships  . Social connections:    Talks on phone: Not on file    Gets together: Not on file    Attends religious service: Not on file    Active member of club or organization: Not on file    Attends meetings of clubs or organizations: Not on file    Relationship status: Not on file  Other Topics Concern  . Not on file  Social History Narrative  . Not on file   Tobacco Counseling Counseling given: Not Answered   Clinical Intake: Pain : No/denies pain   Activities of Daily Living In your present state of health, do you have any difficulty performing the following activities: 01/12/2018  Hearing? Y  Comment wearing hearing aids.  Vision? N  Difficulty concentrating or making decisions? N  Walking or climbing stairs? N  Dressing or bathing? N  Doing errands, shopping? N  Preparing Food and eating ? N  Using the Toilet? N  In the past six months, have you accidently leaked urine? N  Do you have problems with loss of bowel control? N  Managing your Medications? N  Managing your Finances? N  Housekeeping or managing your Housekeeping? N  Some recent data might be hidden     Immunizations and Health Maintenance Immunization History  Administered Date(s) Administered  . Influenza-Unspecified 10/26/2017  . Pneumococcal Polysaccharide-23 03/28/2009   Health Maintenance Due  Topic Date Due  . Samul Dada  10/01/1959    Patient Care Team: [name], DO as PCP - General (Family Medicine)  Indicate any recent Medical Services you may have received from other than [facility] providers in the past year (date may be approximate).    Assessment:   This is a  routine wellness examination for [place]. Physical assessment deferred to PCP.  Hearing/Vision screen  Visual Acuity Screening   Right eye Left eye Both eyes  Without correction:     With correction: 20/50 20/50 20/50   Hearing Screening Comments: Wearing hearing aids   Dietary issues and exercise activities discussed: Current Exercise Habits: Structured exercise class, Type of exercise: Other - see comments(swim), Time (Minutes): 30, Frequency (Times/Week): 2, Weekly Exercise (Minutes/Week): 60, Intensity: Mild, Exercise limited by: None identified Diet (meal preparation, eat out, water intake, caffeinated beverages, dairy products, fruits and vegetables): well balanced   Goals    .  Weight (lb) < 225 lb (102.1 kg)     Cut back on after dinner snacks and continue to exercise.       Depression Screen PHQ 2/9 Scores 01/12/2018 03/18/2017  PHQ - 2 Score 1 0    Fall Risk Fall Risk  01/12/2018 03/18/2017  Falls in the past year? 0 No   Cognitive Function: MMSE - Mini Mental State Exam 01/12/2018  Orientation to time 5  Orientation to Place 5  Registration 3  Attention/ Calculation 5  Recall 2  Language- name 2 objects 2  Language- repeat 1  Language- follow 3 step command 3  Language- read & follow direction 1  Write a sentence 1  Copy design 1  Total score 29        Screening Tests Health Maintenance  Topic Date Due  . TETANUS/TDAP  10/01/1959  . OPHTHALMOLOGY EXAM  03/22/2018  . HEMOGLOBIN A1C  04/16/2018  . FOOT EXAM  05/01/2018  . INFLUENZA VACCINE  Completed  . PNA vac Low Risk Adult  Completed         Plan:    Please schedule your next medicare wellness visit with me in 1 yr.  Continue to eat heart healthy diet (full of fruits, vegetables, whole grains, lean protein, water--limit salt, fat, and sugar intake) and increase physical activity as tolerated.  Continue doing brain stimulating activities (puzzles, reading, adult coloring books, staying active) to  keep memory sharp.     I have personally reviewed and noted the following in the patient's chart:   . Medical and social history . Use of alcohol, tobacco or illicit drugs  . Current medications and supplements . Functional ability and status . Nutritional status . Physical activity . Advanced directives . List of other physicians . Hospitalizations, surgeries, and ER visits in previous 12 months . Vitals . Screenings to include cognitive, depression, and falls . Referrals and appointments  In addition, I have reviewed and discussed with patient certain preventive protocols, quality metrics, and best practice recommendations. A written personalized care plan for preventive services as well as general preventive health recommendations were provided to patient.     Shela Nevin, [place]   01/12/2018

## 2018-01-12 ENCOUNTER — Encounter: Payer: Self-pay | Admitting: *Deleted

## 2018-01-12 ENCOUNTER — Ambulatory Visit (INDEPENDENT_AMBULATORY_CARE_PROVIDER_SITE_OTHER): Payer: 59 | Admitting: *Deleted

## 2018-01-12 VITALS — BP 142/80 | HR 73 | Ht 68.0 in | Wt 254.0 lb

## 2018-01-12 DIAGNOSIS — Z0000 Encounter for general adult medical examination without abnormal findings: Secondary | ICD-10-CM

## 2018-01-12 NOTE — Progress Notes (Signed)
Noted. Agree with above.  [place], DO 01/12/18 4:55 PM

## 2018-01-12 NOTE — Patient Instructions (Signed)
Please schedule your next medicare wellness visit with me in 1 yr.  Continue to eat heart healthy diet (full of fruits, vegetables, whole grains, lean protein, water--limit salt, fat, and sugar intake) and increase physical activity as tolerated.  Continue doing brain stimulating activities (puzzles, reading, adult coloring books, staying active) to keep memory sharp.    Raymond Christensen , Thank you for taking time to come for your Medicare Wellness Visit. I appreciate your ongoing commitment to your health goals. Please review the following plan we discussed and let me know if I can assist you in the future.   These are the goals we discussed: Goals    . Weight (lb) < 225 lb (102.1 kg)     Cut back on after dinner snacks and continue to exercise.        This is a list of the screening recommended for you and due dates:  Health Maintenance  Topic Date Due  . Tetanus Vaccine  10/01/1959  . Eye exam for diabetics  03/22/2018  . Hemoglobin A1C  04/16/2018  . Complete foot exam   05/01/2018  . Flu Shot  Completed  . Pneumonia vaccines  Completed    Health Maintenance After Age 29 After age 18, you are at a higher risk for certain long-term diseases and infections as well as injuries from falls. Falls are a major cause of broken bones and head injuries in people who are older than age 70. Getting regular preventive care can help to keep you healthy and well. Preventive care includes getting regular testing and making lifestyle changes as recommended by your health care provider. Talk with your health care provider about:  Which screenings and tests you should have. A screening is a test that checks for a disease when you have no symptoms.  A diet and exercise plan that is right for you. What should I know about screenings and tests to prevent falls? Screening and testing are the best ways to find a health problem early. Early diagnosis and treatment give you the best chance of managing  medical conditions that are common after age 74. Certain conditions and lifestyle choices may make you more likely to have a fall. Your health care provider may recommend:  Regular vision checks. Poor vision and conditions such as cataracts can make you more likely to have a fall. If you wear glasses, make sure to get your prescription updated if your vision changes.  Medicine review. Work with your health care provider to regularly review all of the medicines you are taking, including over-the-counter medicines. Ask your health care provider about any side effects that may make you more likely to have a fall. Tell your health care provider if any medicines that you take make you feel dizzy or sleepy.  Osteoporosis screening. Osteoporosis is a condition that causes the bones to get weaker. This can make the bones weak and cause them to break more easily.  Blood pressure screening. Blood pressure changes and medicines to control blood pressure can make you feel dizzy.  Strength and balance checks. Your health care provider may recommend certain tests to check your strength and balance while standing, walking, or changing positions.  Foot health exam. Foot pain and numbness, as well as not wearing proper footwear, can make you more likely to have a fall.  Depression screening. You may be more likely to have a fall if you have a fear of falling, feel emotionally low, or feel unable to do activities  that you used to do.  Alcohol use screening. Using too much alcohol can affect your balance and may make you more likely to have a fall. What actions can I take to lower my risk of falls? General instructions  Talk with your health care provider about your risks for falling. Tell your health care provider if: ? You fall. Be sure to tell your health care provider about all falls, even ones that seem minor. ? You feel dizzy, sleepy, or off-balance.  Take over-the-counter and prescription medicines only  as told by your health care provider. These include any supplements.  Eat a healthy diet and maintain a healthy weight. A healthy diet includes low-fat dairy products, low-fat (lean) meats, and fiber from whole grains, beans, and lots of fruits and vegetables. Home safety  Remove any tripping hazards, such as rugs, cords, and clutter.  Install safety equipment such as grab bars in bathrooms and safety rails on stairs.  Keep rooms and walkways well-lit. Activity   Follow a regular exercise program to stay fit. This will help you maintain your balance. Ask your health care provider what types of exercise are appropriate for you.  If you need a cane or walker, use it as recommended by your health care provider.  Wear supportive shoes that have nonskid soles. Lifestyle  Do not drink alcohol if your health care provider tells you not to drink.  If you drink alcohol, limit how much you have: ? 0-1 drink a day for women. ? 0-2 drinks a day for men.  Be aware of how much alcohol is in your drink. In the [place] one drink equals one typical bottle of beer (12 oz), one-half glass of wine (5 oz), or one shot of hard liquor (1 oz).  Do not use any products that contain nicotine or tobacco, such as cigarettes and e-cigarettes. If you need help quitting, ask your health care provider. Summary  Having a healthy lifestyle and getting preventive care can help to protect your health and wellness after age 46.  Screening and testing are the best way to find a health problem early and help you avoid having a fall. Early diagnosis and treatment give you the best chance for managing medical conditions that are more common for people who are older than age 41.  Falls are a major cause of broken bones and head injuries in people who are older than age 57. Take precautions to prevent a fall at home.  Work with your health care provider to learn what changes you can make to improve your health and wellness  and to prevent falls. This information is not intended to replace advice given to you by your health care provider. Make sure you discuss any questions you have with your health care provider. Document Released: 11/13/2016 Document Revised: 11/13/2016 Document Reviewed: 11/13/2016 Elsevier Interactive Patient Education  2019 [employer].

## 2018-01-13 ENCOUNTER — Other Ambulatory Visit: Payer: Self-pay | Admitting: Medical

## 2018-01-19 ENCOUNTER — Other Ambulatory Visit: Payer: Self-pay | Admitting: Medical

## 2018-02-04 ENCOUNTER — Other Ambulatory Visit: Payer: Self-pay | Admitting: Family Medicine

## 2018-02-09 ENCOUNTER — Ambulatory Visit: Payer: 59 | Admitting: Family Medicine

## 2018-02-09 ENCOUNTER — Encounter: Payer: Self-pay | Admitting: Family Medicine

## 2018-02-09 ENCOUNTER — Other Ambulatory Visit: Payer: Self-pay | Admitting: Family Medicine

## 2018-02-09 VITALS — BP 115/68 | HR 97 | Temp 97.6°F | Ht 68.0 in | Wt 251.4 lb

## 2018-02-09 DIAGNOSIS — I10 Essential (primary) hypertension: Secondary | ICD-10-CM | POA: Diagnosis not present

## 2018-02-09 DIAGNOSIS — H04121 Dry eye syndrome of right lacrimal gland: Secondary | ICD-10-CM

## 2018-02-09 DIAGNOSIS — R413 Other amnesia: Secondary | ICD-10-CM | POA: Diagnosis not present

## 2018-02-09 DIAGNOSIS — D489 Neoplasm of uncertain behavior, unspecified: Secondary | ICD-10-CM

## 2018-02-09 DIAGNOSIS — F418 Other specified anxiety disorders: Secondary | ICD-10-CM

## 2018-02-09 MED ORDER — SIMVASTATIN 40 MG PO TABS: 40.0000 mg | ORAL_TABLET | Freq: Every day | ORAL | 3 refills | Status: DC

## 2018-02-09 MED ORDER — RAMIPRIL 10 MG PO CAPS: 10.0000 mg | ORAL_CAPSULE | Freq: Two times a day (BID) | ORAL | 3 refills | Status: DC

## 2018-02-09 MED ORDER — DONEPEZIL HCL 5 MG PO TABS: 5.0000 mg | ORAL_TABLET | Freq: Every day | ORAL | 3 refills | Status: DC

## 2018-02-09 MED ORDER — FLUTICASONE PROPIONATE 50 MCG/ACT NA SUSP: NASAL | 1 refills | Status: DC

## 2018-02-09 NOTE — Progress Notes (Signed)
Chief Complaint  Patient presents with  . Follow-up    Subjective Raymond Christensen presents for f/u anxiety/depression.  He is currently being treated with Cymbalta 60 mg in AM and 30 mg in PM.  Reports mild improvement since treatment. No thoughts of harming self or others. No self-medication with alcohol, prescription drugs or illicit drugs. Pt is not following with a counselor/psychologist.  Hypertension Patient presents for hypertension follow up. He does monitor home blood pressures. Blood pressures ranging on average from 120-130's/70's. He is compliant with medications. Patient has these side effects of medication: none He is adhering to a healthy diet overall. Exercise: some cardio, strength training  Memory issues Still having, never got called regarding appt. Interested in starting Aricept for this until he gets in.   Skin lesion on left side of neck that has been there for "quite a while".  Is not changing but bothersome when he shaves.  No drainage or pain.  ROS Psych: No homicidal or suicidal thoughts Skin: + Lesion on neck  Past Medical History:  Diagnosis Date  . Arthritis   . CAD (coronary artery disease)   . Depression   . Diabetes mellitus without complication ([place])   . Heart disease   . Hyperlipidemia   . Hypertension   . Thyroid disease      Exam BP 115/68 (BP Location: Left Arm, Patient Position: Sitting, Cuff Size: Large)   Pulse 97   Temp 97.6 F (36.4 C) (Oral)   Ht 5\' 8"  (1.727 m)   Wt 251 lb 6 oz (114 kg)   SpO2 99%   BMI 38.22 kg/m  General:  well developed, well nourished, in no apparent distress Eyes: PERRLA, no injection, EOMi Neck: neck supple without adenopathy, thyromegaly, or masses Lungs:  clear to auscultation, breath sounds equal bilaterally, no respiratory distress Cardio:  regular rate and rhythm without murmurs, heart sounds without clicks or rubs Skin: See below; measures 0.6 cm in diameter Psych: well oriented with  normal range of affect and age-appropriate judgement/insight, alert and oriented x4.   L neck  Assessment and Plan  Dry eye of right side  Memory changes  Essential hypertension - Plan: ramipril (ALTACE) 10 MG capsule  Anxiety with depression  Neoplasm of uncertain behavior - Plan: Dermatology pathology([place]), PR SHAV SKIN LES 0.6-1.0 CM TRUNK,ARM,LEG   Procedure note; shave biopsy Informed consent was obtained. The area was cleaned with alcohol and injected with 1 mL of 1% lidocaine with epinephrine. A Dermablade was slightly bent and used to cut under the area of interest. The specimen was placed in a sterile specimen cup and sent to the lab. The area was then cauterized ensuring adequate hemostasis. The area was dressed with triple antibiotic ointment and a bandage. There were no complications noted. The patient tolerated the procedure well.   Orders as above. Continue Cymbalta, recommended counseling again.  For his memory issues, we will reach out to see what is holding back the referral process.  Artificial tears from the eye. Aftercare instructions verbalized and written down.  I think this is likely squamous cell carcinoma or basal cell. F/u in 3 mo. The patient voiced understanding and agreement to the plan.  [place], DO 02/09/18 12:29 PM

## 2018-02-09 NOTE — Progress Notes (Signed)
Pre visit review using our clinic review tool, if applicable. No additional management support is needed unless otherwise documented below in the visit note. 

## 2018-02-09 NOTE — Patient Instructions (Addendum)
We will be betting labs at your next visit after your vacation.   Artificial tears for the R eye. Use every 2-4 hours as needed. Refresh/Systane are options for this and are available otc.   Keep the diet clean and stay active.  Please consider counseling. Contact [phone] to schedule an appointment or inquire about cost/insurance coverage.  We are looking into the psych eval and will reach out to you regarding this. If you don't hear anything in the next 1-2 weeks, reach out to [place].   Do not shower for the rest of the day. When you do wash it, use only soap and water. Do not vigorously scrub. Apply triple antibiotic ointment (like Neosporin) twice daily. Keep the area clean and dry.   Things to look out for: increasing pain not relieved by ibuprofen/acetaminophen, fevers, spreading redness, drainage of pus, or foul odor.  Give [place] 1 business week to get the results of your biopsy back.  Let us know if you need anything.

## 2018-02-09 NOTE — Telephone Encounter (Signed)
Copied from [place] [id number]. Topic: Quick Communication - Rx Refill/Question >> Feb 09, 2018  1:21 PM [place], [place] A wrote: Medication:  chlorthalidone (HYGROTON) 25 MG tablet [[id number]] DULoxetine (CYMBALTA) 30 MG capsule [[id number]] finasteride (PROSCAR) 5 MG tablet [[id number]] ramipril (ALTACE) 10 MG capsule [[id number]]  simvastatin (ZOCOR) 40 MG tablet [[id number]]  fluticasone (FLONASE) 50 MCG/ACT nasal spray [[id number]] levocetirizine (XYZAL) 5 MG tablet [[id number]  Has the patient contacted their pharmacy?(Agent: If no, request that the patient contact the pharmacy for the refill.) (Agent: If yes, when and what did the pharmacy advise?)  Preferred Pharmacy (with phone number or street name):  [facility], [place] to Registered [facility] Sites   Agent: Please be advised that RX refills may take up to 3 business days. We ask that you follow-up with your pharmacy.

## 2018-02-10 MED ORDER — CHLORTHALIDONE 25 MG PO TABS: 12.5000 mg | ORAL_TABLET | Freq: Every day | ORAL | 1 refills | Status: DC

## 2018-02-12 ENCOUNTER — Other Ambulatory Visit: Payer: Self-pay | Admitting: Family Medicine

## 2018-02-12 ENCOUNTER — Encounter: Payer: Self-pay | Admitting: Family Medicine

## 2018-02-12 ENCOUNTER — Telehealth: Payer: Self-pay | Admitting: *Deleted

## 2018-02-12 ENCOUNTER — Telehealth: Payer: Self-pay

## 2018-02-12 DIAGNOSIS — C4492 Squamous cell carcinoma of skin, unspecified: Secondary | ICD-10-CM

## 2018-02-12 DIAGNOSIS — R413 Other amnesia: Secondary | ICD-10-CM

## 2018-02-12 NOTE — Telephone Encounter (Signed)
Copied from [place] [id number]. Topic: Referral - Request for Referral >> Feb 12, 2018 12:15 PM Yvette Rack wrote: Has patient seen PCP for this complaint? yes  *If NO, is insurance requiring patient see PCP for this issue before PCP can refer them? Referral for which specialty: Dermatology Preferred provider/office: [facility] / [facility] Dermatology - Pt has already seen provider before Reason for referral: remove growth

## 2018-02-12 NOTE — Telephone Encounter (Signed)
[place], he did not tell me this prior to me putting in referral. TY.

## 2018-02-12 NOTE — Telephone Encounter (Signed)
Received Dermatopathology Report results from [facility]; forwarded to provider/SLS 01/30

## 2018-02-13 ENCOUNTER — Encounter: Payer: Self-pay | Admitting: Neurology

## 2018-02-13 ENCOUNTER — Encounter: Payer: Self-pay | Admitting: Family Medicine

## 2018-02-19 ENCOUNTER — Encounter: Payer: Self-pay | Admitting: Family Medicine

## 2018-02-19 DIAGNOSIS — F418 Other specified anxiety disorders: Secondary | ICD-10-CM

## 2018-02-19 DIAGNOSIS — I10 Essential (primary) hypertension: Secondary | ICD-10-CM

## 2018-02-20 ENCOUNTER — Other Ambulatory Visit: Payer: Self-pay | Admitting: Family Medicine

## 2018-02-20 DIAGNOSIS — I10 Essential (primary) hypertension: Secondary | ICD-10-CM

## 2018-02-20 MED ORDER — FLUTICASONE PROPIONATE 50 MCG/ACT NA SUSP: NASAL | 1 refills | Status: DC

## 2018-02-20 MED ORDER — EZETIMIBE 10 MG PO TABS: 10.0000 mg | ORAL_TABLET | Freq: Every day | ORAL | 1 refills | Status: DC

## 2018-02-20 MED ORDER — LEVOCETIRIZINE DIHYDROCHLORIDE 5 MG PO TABS: ORAL_TABLET | ORAL | 1 refills | Status: DC

## 2018-02-20 MED ORDER — LEVOTHYROXINE SODIUM 75 MCG PO TABS: 75.0000 ug | ORAL_TABLET | Freq: Every day | ORAL | 1 refills | Status: DC

## 2018-02-20 MED ORDER — RAMIPRIL 10 MG PO CAPS: 10.0000 mg | ORAL_CAPSULE | Freq: Two times a day (BID) | ORAL | 3 refills | Status: DC

## 2018-02-20 MED ORDER — RAMIPRIL 10 MG PO CAPS: 10.0000 mg | ORAL_CAPSULE | Freq: Two times a day (BID) | ORAL | 1 refills | Status: DC

## 2018-02-23 MED ORDER — DULOXETINE HCL 30 MG PO CPEP: ORAL_CAPSULE | ORAL | 1 refills | Status: DC

## 2018-02-23 MED ORDER — CHLORTHALIDONE 25 MG PO TABS: 12.5000 mg | ORAL_TABLET | Freq: Every day | ORAL | 1 refills | Status: DC

## 2018-02-23 NOTE — Addendum Note (Signed)
Addended byDamita Dunnings D on: 02/23/2018 11:42 AM   Modules accepted: Orders

## 2018-02-27 ENCOUNTER — Other Ambulatory Visit: Payer: Self-pay | Admitting: Family Medicine

## 2018-02-27 ENCOUNTER — Encounter: Payer: Self-pay | Admitting: Family Medicine

## 2018-02-27 DIAGNOSIS — N39498 Other specified urinary incontinence: Secondary | ICD-10-CM

## 2018-02-27 NOTE — Progress Notes (Signed)
amb  

## 2018-03-19 ENCOUNTER — Ambulatory Visit: Payer: 59 | Admitting: Neurology

## 2018-03-19 ENCOUNTER — Other Ambulatory Visit: Payer: Self-pay

## 2018-03-19 ENCOUNTER — Encounter: Payer: Self-pay | Admitting: Neurology

## 2018-03-19 VITALS — BP 118/60 | HR 87 | Ht 68.0 in | Wt 252.0 lb

## 2018-03-19 DIAGNOSIS — R413 Other amnesia: Secondary | ICD-10-CM | POA: Diagnosis not present

## 2018-03-19 DIAGNOSIS — G3184 Mild cognitive impairment, so stated: Secondary | ICD-10-CM | POA: Diagnosis not present

## 2018-03-19 DIAGNOSIS — R4189 Other symptoms and signs involving cognitive functions and awareness: Secondary | ICD-10-CM

## 2018-03-19 DIAGNOSIS — R4689 Other symptoms and signs involving appearance and behavior: Secondary | ICD-10-CM | POA: Diagnosis not present

## 2018-03-19 NOTE — Progress Notes (Signed)
NEUROLOGY CONSULTATION NOTE  [name] MRN: [id number] DOB: [birth year]  Referring provider: Dr. Riki Sheer Primary care provider: Dr. Riki Sheer  Reason for consult:  Memory loss  Dear Dr Nani Ravens:  Thank you for your kind referral of Raymond Christensen for consultation of the above symptoms. Although his history is well known to you, please allow me to reiterate it for the purpose of our medical record. The patient was accompanied to the clinic by his wife who also provides collateral information. Records and images were personally reviewed where available.  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 He reports his memory is worsening, "especially if you speak with my wife who can see more of it." Several times during the visit, he and his wife would have disagreements and would roll each others eyes or defend themselves. He states he does not remember things as well as he used to. He has gotten lost driving a few times over the past month, most recently last Saturday. He would take the wrong turn. He states it is mostly at night but his wife shakes her head. His wife states he refuses to use GPS. They were coming home from [place] and he was not sure which way to turn, or took the wrong exit off the highway. He gets so angry when he is in the car that she gets nervous. He states "I never do anything right." He rolls his eyes several times when she reports that he does not get the car washed regularly, and forgot to take the deer alerts when he went for a car wash and they broke. He continues to manage bills, but 2 months ago she noticed he was not balancing his checkbook right. There have been 3 incidents with his checkbook. Last week they found out there was no money in the checking account, they went to the bank and he could not  understand why. He states what he did not understand at that point was that he had overdone withdrawals. He manages his medications independently and denies missing doses. He has always had a hard time multitasking, and this is probably worse. His wife agrees. Over the past 6 months, he has been repeating himself more, asking what time his appointment is repeatedly. Over the past year, she has noticed his handwriting has become more scribbly, he thinks he writes the same way.She notes increased irritability over the past year, with a lot of anger and yelling. She complains about his lack of exercise and lack of initiative. No paranoia or hallucinations. Sleep is good. He was recently started on Donepezil 5mg  daily, no side effects.  He has been previously evaluated 2 centers for memory loss. 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 He had repeat testing at [facility] in 2016 with weakness in working memory and some attention, but testing within normal range compared to peers, no decline noted, and it was thought that his cognitive weakness was due to mood issues (depression). He was started on Duloxetine with some good response. He again had normal cognitive testing in February 2019 and duloxetine dose was further increased. His wife continued to report lack of empathy and initiative. His last MOCA in April 2019 was 28/30. He had a PET scan in 2019 which showed  relatively symmetric uptake of FDG in the brain with no definite patterns of abnormal FDG distribution identified. His last MRI brain was in 2016 showing moderate global atrophy, prominent bifrontal subarachnoid spaces, minimal chronic microvascular disease.   He denies any headaches, dizziness, vision changes, dysarthria/dysphagia, neck/back pain, focal numbness/tingling/weakness, bowel/ dysfunction, anosmia, or tremors.  He has "semi-incontinence with urine." He uses modafinil for excessive daytime sleepiness and has a CPAP for sleep apnea. He reports mood is okay in general but he does admit to getting irritated easily. He states he feels like he is being overprotected by his wife "who is micromanaging me."   Laboratory Data: Lab Results  Component Value Date   TSH 1.69 10/15/2017   Lab Results  Component Value Date   [id number] 660 09/19/2017    PAST MEDICAL HISTORY: Past Medical History:  Diagnosis Date  . Arthritis   . CAD (coronary artery disease)   . Depression   . Diabetes mellitus without complication ([place])   . Heart disease   . Hyperlipidemia   . Hypertension   . Thyroid disease     PAST SURGICAL HISTORY: Past Surgical History:  Procedure Laterality Date  . BYPASS GRAFT  2001  . CAROTID ARTERY ANGIOPLASTY    . TOTAL HIP ARTHROPLASTY  1994, 1995    MEDICATIONS: Current Outpatient Medications on File Prior to Visit  Medication Sig Dispense Refill  . Armodafinil 250 MG tablet Take by mouth.    Marland Kitchen aspirin EC 81 MG tablet Take by mouth.    . chlorthalidone (HYGROTON) 25 MG tablet Take 0.5 tablets (12.5 mg total) by mouth daily. 45 tablet 1  . Cholecalciferol (VITAMIN D3) 10000 units TABS Take by mouth.    . Cyanocobalamin (VITAMIN B-12 PO) Take by mouth.    . donepezil (ARICEPT) 5 MG tablet Take 1 tablet (5 mg total) by mouth at bedtime. 30 tablet 3  . DULoxetine (CYMBALTA) 30 MG capsule Take in the evening after the 60 mg in the morning. 90 capsule 1  . DULoxetine (CYMBALTA) 60 MG capsule Take 1 capsule (60 mg total) by mouth daily. 30 capsule 3  . ezetimibe (ZETIA) 10 MG tablet Take 1 tablet (10 mg total) by mouth daily. 90 tablet 1  . finasteride (PROSCAR) 5 MG tablet TAKE 1 TABLET DAILY 90 tablet 1  . fluticasone (FLONASE) 50 MCG/ACT nasal spray SHAKE LIQUID AND USE 2 SPRAYS IN EACH NOSTRIL DAILY 48 g 1  . folic acid (FOLVITE) 630 MCG tablet Take 400 mcg by mouth daily.    Marland Kitchen  levocetirizine (XYZAL) 5 MG tablet TAKE 1 TABLET(5 MG) BY MOUTH EVERY EVENING 90 tablet 1  . levothyroxine (SYNTHROID, LEVOTHROID) 75 MCG tablet Take 1 tablet (75 mcg total) by mouth daily. 90 tablet 1  . liothyronine (CYTOMEL) 5 MCG tablet     . modafinil (PROVIGIL) 200 MG tablet     . omega-3 acid ethyl esters (LOVAZA) 1 g capsule Take 2 g by mouth 2 (two) times daily.     . ramipril (ALTACE) 10 MG capsule Take 1 capsule (10 mg total) by mouth 2 (two) times daily. 180 capsule 3  . simvastatin (ZOCOR) 40 MG tablet Take 1 tablet (40 mg total) by mouth daily at 6 PM. 90 tablet 3  . Zinc 50 MG TABS Take by mouth.     No current facility-administered medications on file prior to visit.     ALLERGIES: Allergies  Allergen Reactions  . Sulfa Antibiotics Rash    Questionable, he  developed a diffuse rash 2 days after stopping Bactrim    FAMILY HISTORY: Family History  Problem Relation Age of Onset  . Cancer Neg Hx     SOCIAL HISTORY: Social History   Socioeconomic History  . Marital status: Married    Spouse name: Not on file  . Number of children: Not on file  . Years of education: Not on file  . Highest education level: Not on file  Occupational History  . Not on file  Social Needs  . Financial resource strain: Not on file  . Food insecurity:    Worry: Not on file    Inability: Not on file  . Transportation needs:    Medical: Not on file    Non-medical: Not on file  Tobacco Use  . Smoking status: Former Smoker    Types: Cigarettes  . Smokeless tobacco: Never Used  Substance and Sexual Activity  . Alcohol use: Yes    Comment: twice per week  . Drug use: No  . Sexual activity: Yes  Lifestyle  . Physical activity:    Days per week: Not on file    Minutes per session: Not on file  . Stress: Not on file  Relationships  . Social connections:    Talks on phone: Not on file    Gets together: Not on file    Attends religious service: Not on file    Active member of  club or organization: Not on file    Attends meetings of clubs or organizations: Not on file    Relationship status: Not on file  . Intimate partner violence:    Fear of current or ex partner: Not on file    Emotionally abused: Not on file    Physically abused: Not on file    Forced sexual activity: Not on file  Other Topics Concern  . 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 [employer]: Constitutional: No fevers, chills, or sweats, no generalized fatigue, change in appetite Eyes: No visual changes, double vision, eye pain Ear, nose and throat: No hearing loss, ear pain, nasal congestion, sore throat Cardiovascular: No chest pain, palpitations Respiratory:  No shortness of breath at rest or with exertion, wheezes GastrointestinaI: No nausea, vomiting, diarrhea, abdominal pain, fecal incontinence Genitourinary:  No dysuria, urinary retention or frequency Musculoskeletal:  No neck pain, back pain Integumentary: No rash, pruritus, skin lesions Neurological: as above Psychiatric: No depression, insomnia, anxiety Endocrine: No palpitations, fatigue, diaphoresis, mood swings, change in appetite, change in weight, increased thirst Hematologic/Lymphatic:  No anemia, purpura, petechiae. Allergic/Immunologic: no itchy/runny eyes, nasal congestion, recent allergic reactions, rashes  PHYSICAL EXAM: Vitals:   03/19/18 1029  BP: 118/60  Pulse: 87  SpO2: 98%   General: No acute distress Head:  Normocephalic/atraumatic Eyes: Fundoscopic exam shows bilateral sharp discs, no vessel changes, exudates, or hemorrhages Neck: supple, no paraspinal tenderness, full range of motion Back: No paraspinal tenderness Heart: regular rate and rhythm Lungs: Clear to auscultation bilaterally. Vascular: No carotid bruits. Skin/Extremities:  No rash, no edema Neurological Exam: Mental status: alert and oriented to person, place, and time, no dysarthria or aphasia, Fund of knowledge is appropriate.  Remote memory intact.  Attention and concentration are reduced.  Able to name objects and  repeat phrases.  Montreal Cognitive Assessment  03/19/2018  Visuospatial/ Executive (0/5) 5  Naming (0/3) 3  Attention: Read list of digits (0/2) 2  Attention: Read list of letters (0/1) 0  Attention: Serial 7 subtraction starting at 100 (0/3) 2  Language: Repeat phrase (0/2) 2  Language : Fluency (0/1) 1  Abstraction (0/2) 2  Delayed Recall (0/5) 0  Orientation (0/6) 6  Total 23   Cranial nerves: CN I: not tested CN II: pupils equal, round and reactive to light, visual fields intact, fundi unremarkable. CN III, IV, VI:  full range of motion, no nystagmus, no ptosis CN V: facial sensation intact CN VII: upper and lower face symmetric CN VIII: hearing intact to finger rub CN IX, X: gag intact, uvula midline CN XI: sternocleidomastoid and trapezius muscles intact CN XII: tongue midline Bulk & Tone: normal, no cogwheeling, no fasciculations. Motor: 5/5 throughout with no pronator drift. Sensation: intact to light touch, cold, pin on both UE and LE, decreased vibration sense to ankle bilaterally.  No extinction to double simultaneous stimulation.  Romberg test negative Deep Tendon Reflexes: +1 on both UE, +2 both LE, no ankle clonus Plantar responses: downgoing bilaterally Cerebellar: no incoordination on finger to nose, heel to shin. No dysdiadochokinesia Gait: narrow-based and steady, able to tandem walk adequately. 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 He has had 2 Neuropsychological evaluation in 2013 and 2016. Testing in 2013 indicated "mild neurocognitive disorder due to multiple etiology" noting his cardiovascular health and history of multiple falls. Repeat testing at [facility] in 2016 showed weakness in working memory and some attention, but testing within normal range compared to peers, no decline noted, and it was thought that his cognitive weakness was due to mood issues (depression). He and his wife continue to note memory decline, as well as more personality changes (anger issues). His MOCA score today is 23/30 (28/30 in April 2019). He is having more difficulties balancing his checkbook and with driving, concerning for mild dementia, however a recent PET scan in 2019 did not show any abnormalities. He and his wife are noted to be arguing during the visit, depression may again be a contributing factor. He is agreeable to seeing geriatric psychiatric Dr. Casimiro Needle and psychologist Dr. Cheryln Manly. MRI brain without contrast will be ordered to assess for underlying structural abnormality. Repeat Neuropsychological testing would be helpful as well. Continue Donepezil 5mg  daily. We discussed driving concerns, recommend driving evaluation. We discussed the importance of control of vascular risk factors, physical exercise, and brain stimulation exercises for brain health. Follow-up in 6 months, they know to call for any changes.   Thank you for allowing me to participate in the care of this patient. Please do not hesitate to call for any questions or concerns.   Ellouise Newer, M.D.  CC: Dr. Nani Ravens

## 2018-03-19 NOTE — Patient Instructions (Addendum)
1. Schedule MRI brain without contrast  We have sent a referral to [facility] for your MRI and they will call you directly to schedule your appt. They are located at [place]. If you need to contact them directly please call [phone].  2. Refer for repeat Neurocognitive testing 3. Refer to Dr. Casimiro Needle and Dr. Cheryln Manly for mood changes 4. Continue Donepezil 5mg  daily 5. Recommend driving evaluation:  [employer] in [place] or [employer] [phone]  6. Follow-up in 6 months, call for any changes  FALL PRECAUTIONS: Be cautious when walking. Scan the area for obstacles that may increase the risk of trips and falls. When getting up in the mornings, sit up at the edge of the bed for a few minutes before getting out of bed. Consider elevating the bed at the head end to avoid drop of blood pressure when getting up. Walk always in a well-lit room (use night lights in the walls). Avoid area rugs or power cords from appliances in the middle of the walkways. Use a walker or a cane if necessary and consider physical therapy for balance exercise. Get your eyesight checked regularly.  FINANCIAL OVERSIGHT: Supervision, especially oversight when making financial decisions or transactions is also recommended.  HOME SAFETY: Consider the safety of the kitchen when operating appliances like stoves, microwave oven, and blender. Consider having supervision and share cooking responsibilities until no longer able to participate in those. Accidents with firearms and other hazards in the house should be identified and addressed as well.  DRIVING: Regarding driving, in patients with progressive memory problems, driving will be impaired. We advise to have someone else do the driving if trouble finding directions or if minor accidents are reported. Independent driving assessment is available to determine safety of driving.  ABILITY TO BE LEFT  ALONE: If patient is unable to contact 911 operator, consider using LifeLine, or when the need is there, arrange for someone to stay with patients. Smoking is a fire hazard, consider supervision or cessation. Risk of wandering should be assessed by caregiver and if detected at any point, supervision and safe proof recommendations should be instituted.  MEDICATION SUPERVISION: Inability to self-administer medication needs to be constantly addressed. Implement a mechanism to ensure safe administration of the medications.  RECOMMENDATIONS FOR ALL PATIENTS WITH MEMORY PROBLEMS: 1. Continue to exercise (Recommend 30 minutes of walking everyday, or 3 hours every week) 2. Increase social interactions - continue going to [place] and enjoy social gatherings with friends and family 3. Eat healthy, avoid fried foods and eat more fruits and vegetables 4. Maintain adequate blood pressure, blood sugar, and blood cholesterol level. Reducing the risk of stroke and cardiovascular disease also helps promoting better memory. 5. Avoid stressful situations. Live a simple life and avoid aggravations. Organize your time and prepare for the next day in anticipation. 6. Sleep well, avoid any interruptions of sleep and avoid any distractions in the bedroom that may interfere with adequate sleep quality 7. Avoid sugar, avoid sweets as there is a strong link between excessive sugar intake, diabetes, and cognitive impairment We discussed the Mediterranean diet, which has been shown to help patients reduce the risk of progressive memory disorders and reduces cardiovascular risk. This includes eating fish, eat fruits and green leafy vegetables, nuts like almonds and hazelnuts, walnuts, and also use olive oil. Avoid fast foods and fried foods as much as possible. Avoid sweets and sugar as sugar use has been linked to worsening of  memory function.  There is always a concern of gradual progression of memory problems. If this is the  case, then we may need to adjust level of care according to patient needs. Support, both to the patient and caregiver, should then be put into place.

## 2018-03-24 LAB — HM DIABETES EYE EXAM

## 2018-03-25 ENCOUNTER — Telehealth: Payer: Self-pay | Admitting: *Deleted

## 2018-03-25 ENCOUNTER — Encounter: Payer: Self-pay | Admitting: Family Medicine

## 2018-03-25 NOTE — Telephone Encounter (Signed)
Received Diabetic Eye Exam Report from [facility]; forwarded to provider/SLS 03/11

## 2018-04-01 ENCOUNTER — Other Ambulatory Visit: Payer: Self-pay

## 2018-04-01 ENCOUNTER — Ambulatory Visit
Admission: RE | Admit: 2018-04-01 | Discharge: 2018-04-01 | Disposition: A | Discharge status: Home / Self Care | Payer: 59 | Source: Ambulatory Visit | Attending: Neurology | Admitting: Neurology

## 2018-04-01 DIAGNOSIS — R413 Other amnesia: Secondary | ICD-10-CM

## 2018-05-11 ENCOUNTER — Ambulatory Visit (INDEPENDENT_AMBULATORY_CARE_PROVIDER_SITE_OTHER): Payer: Medicare (Managed Care) | Admitting: Psychology

## 2018-05-11 DIAGNOSIS — F3289 Other specified depressive episodes: Secondary | ICD-10-CM | POA: Diagnosis not present

## 2018-05-22 ENCOUNTER — Ambulatory Visit (INDEPENDENT_AMBULATORY_CARE_PROVIDER_SITE_OTHER): Payer: Medicare (Managed Care) | Admitting: Psychology

## 2018-05-22 DIAGNOSIS — F3289 Other specified depressive episodes: Secondary | ICD-10-CM | POA: Diagnosis not present

## 2018-05-25 ENCOUNTER — Encounter: Payer: Self-pay | Admitting: Family Medicine

## 2018-05-25 ENCOUNTER — Ambulatory Visit: Payer: 59 | Admitting: Family Medicine

## 2018-05-25 ENCOUNTER — Other Ambulatory Visit: Payer: Self-pay

## 2018-05-25 VITALS — BP 114/68 | HR 98 | Temp 98.2°F | Ht 68.0 in | Wt 247.0 lb

## 2018-05-25 DIAGNOSIS — E119 Type 2 diabetes mellitus without complications: Secondary | ICD-10-CM | POA: Diagnosis not present

## 2018-05-25 DIAGNOSIS — E039 Hypothyroidism, unspecified: Secondary | ICD-10-CM

## 2018-05-25 DIAGNOSIS — I10 Essential (primary) hypertension: Secondary | ICD-10-CM | POA: Diagnosis not present

## 2018-05-25 LAB — LIPID PANEL
Cholesterol: 138 mg/dL (ref 0–200)
HDL: 42 mg/dL (ref >39.00)
LDL Cholesterol: 65 mg/dL (ref 0–99)
NonHDL: 96.23
Total CHOL/HDL Ratio: 3
Triglycerides: 157 mg/dL — ABNORMAL HIGH (ref 0.0–149.0)
VLDL: 31.4 mg/dL (ref 0.0–40.0)

## 2018-05-25 LAB — COMPREHENSIVE METABOLIC PANEL
ALT: 38 U/L (ref 0–53)
AST: 28 U/L (ref 0–37)
Albumin: 4.5 g/dL (ref 3.5–5.2)
Alkaline Phosphatase: 66 U/L (ref 39–117)
BUN: 23 mg/dL (ref 6–23)
CO2: 33 mEq/L — ABNORMAL HIGH (ref 19–32)
Calcium: 9.4 mg/dL (ref 8.4–10.5)
Chloride: 98 mEq/L (ref 96–112)
Creatinine, Ser: 1.09 mg/dL (ref 0.40–1.50)
GFR: 65.66 mL/min (ref >60.00)
Glucose, Bld: 99 mg/dL (ref 70–99)
Potassium: 4.7 mEq/L (ref 3.5–5.1)
Sodium: 137 mEq/L (ref 135–145)
Total Bilirubin: 0.5 mg/dL (ref 0.2–1.2)
Total Protein: 6.7 g/dL (ref 6.0–8.3)

## 2018-05-25 LAB — MICROALBUMIN / CREATININE URINE RATIO
Creatinine,U: 89.2 mg/dL
Microalb Creat Ratio: 0.8 mg/g (ref 0.0–30.0)
Microalb, Ur: <0.7 mg/dL (ref 0.0–1.9)

## 2018-05-25 LAB — HEMOGLOBIN A1C: Hgb A1c MFr Bld: 6.8 % — ABNORMAL HIGH (ref 4.6–6.5)

## 2018-05-25 NOTE — Patient Instructions (Signed)
Give us 2-3 business days to get the results of your labs back.   Keep the diet clean and stay active.  Let us know if you need anything. 

## 2018-05-25 NOTE — Progress Notes (Signed)
Subjective:   Chief Complaint  Patient presents with  . Follow-up    Raymond Christensen is a 78 y.o. male here for follow-up of diabetes.   Kayode does not check his sugars at home. Patient does not require insulin.   Medications include: diet controlled Diet is fair. Exercise: walking  Hypertension Patient presents for hypertension follow up. He does not routinely monitor home blood pressures. He is compliant with medications- Toprol XL 50 mg/d, ramipril  10 mg/d, chlorthalidone. Patient has these side effects of medication: none He is adhering to a healthy diet overall. Exercise: walking   Mood about the same doing relatively well with Cymbalta. Feels wife micro managing life due to memory issues. Starting to follow w counselor. Reports compliance w meds, no AE. No SI or HI.  Hypothyroidism Patient presents for follow-up of hypothyroidism.  Reports compliance with medication. Current symptoms include: denies fatigue, weight changes, heat/cold intolerance, bowel/skin changes or CVS symptoms He believes his dose should be unchanged   Past Medical History:  Diagnosis Date  . Arthritis   . CAD (coronary artery disease)   . Depression   . Diabetes mellitus without complication ([place])   . Heart disease   . Hyperlipidemia   . Hypertension   . Thyroid disease      Related testing: Date of retinal exam: done Pneumovax: done Flu Shot: done  Review of Systems: Pulmonary:  No SOB Cardiovascular:  No chest pain  Objective:  BP 114/68 (BP Location: Left Arm, Patient Position: Sitting, Cuff Size: Large)   Pulse 98   Temp 98.2 F (36.8 C) (Oral)   Ht 5\' 8"  (1.727 m)   Wt 247 lb (112 kg)   SpO2 98%   BMI 37.56 kg/m  General:  Well developed, well nourished, in no apparent distress Skin:  Warm, no pallor or diaphoresis Head:  Normocephalic, atraumatic Eyes:  Pupils equal and round, sclera anicteric without injection  Lungs:  CTAB, no access msc use Cardio:  RRR, no  bruits, no LE edema Musculoskeletal:  Symmetrical muscle groups noted without atrophy or deformity Neuro:  Sensation intact to pinprick on feet Psych: Age appropriate judgment and insight  Assessment:   Type 2 diabetes mellitus without complication, without long-term current use of insulin (HCC) - Plan: Hemoglobin A1c, Lipid panel, Comprehensive metabolic panel, Microalbumin / creatinine urine ratio, HM DIABETES FOOT EXAM  Morbid obesity (HCC)  Essential hypertension  Hypothyroidism, unspecified type   Plan:   Orders as above. Counseled on diet and exercise. Cont working with Neuro and counseling.  F/u in 6 mo. The patient voiced understanding and agreement to the plan.  [place], DO 05/25/18 10:16 AM

## 2018-06-05 ENCOUNTER — Ambulatory Visit (INDEPENDENT_AMBULATORY_CARE_PROVIDER_SITE_OTHER): Payer: Medicare (Managed Care) | Admitting: Psychology

## 2018-06-05 DIAGNOSIS — F3289 Other specified depressive episodes: Secondary | ICD-10-CM

## 2018-06-10 ENCOUNTER — Telehealth: Payer: Self-pay | Admitting: Family Medicine

## 2018-06-10 NOTE — Telephone Encounter (Signed)
Copied from [place] [id number]. Topic: Quick Communication - Rx Refill/Question >> Jun 10, 2018  4:36 PM Mcneil, Ja-Kwan wrote: Medication: omega-3 acid ethyl esters (LOVAZA) 1 g capsule  Has the patient contacted their pharmacy? no  Preferred Pharmacy (with phone number or street name): [facility] [place], [place] to [facility] [facility] [phone] (Phone)  [phone] (Fax)  Agent: Please be advised that RX refills may take up to 3 business days. We ask that you follow-up with your pharmacy.

## 2018-06-11 MED ORDER — OMEGA-3-ACID ETHYL ESTERS 1 G PO CAPS: 2.0000 g | ORAL_CAPSULE | Freq: Two times a day (BID) | ORAL | 2 refills | Status: DC

## 2018-06-11 NOTE — Telephone Encounter (Signed)
Med refilled and sent to pharmacy

## 2018-06-15 NOTE — Telephone Encounter (Signed)
Pt called and stated that he would need a 90 day supply. Please advise

## 2018-06-19 ENCOUNTER — Ambulatory Visit (INDEPENDENT_AMBULATORY_CARE_PROVIDER_SITE_OTHER): Payer: Medicare (Managed Care) | Admitting: Psychology

## 2018-06-19 DIAGNOSIS — F3289 Other specified depressive episodes: Secondary | ICD-10-CM | POA: Diagnosis not present

## 2018-06-26 ENCOUNTER — Encounter: Payer: Self-pay | Admitting: Family Medicine

## 2018-06-29 MED ORDER — OMEGA-3-ACID ETHYL ESTERS 1 G PO CAPS: 2.0000 g | ORAL_CAPSULE | Freq: Two times a day (BID) | ORAL | 2 refills | Status: DC

## 2018-06-30 ENCOUNTER — Ambulatory Visit (INDEPENDENT_AMBULATORY_CARE_PROVIDER_SITE_OTHER): Payer: Medicare (Managed Care) | Admitting: Psychology

## 2018-06-30 DIAGNOSIS — F3289 Other specified depressive episodes: Secondary | ICD-10-CM | POA: Diagnosis not present

## 2018-07-08 ENCOUNTER — Encounter: Payer: Self-pay | Admitting: Family Medicine

## 2018-07-10 ENCOUNTER — Encounter: Payer: Self-pay | Admitting: Family Medicine

## 2018-07-10 ENCOUNTER — Ambulatory Visit: Payer: 59 | Admitting: Family Medicine

## 2018-07-10 ENCOUNTER — Other Ambulatory Visit: Payer: Self-pay

## 2018-07-10 VITALS — BP 128/76 | HR 81 | Temp 98.1°F | Ht 68.0 in | Wt 240.4 lb

## 2018-07-10 DIAGNOSIS — L750 Bromhidrosis: Secondary | ICD-10-CM

## 2018-07-10 MED ORDER — CLINDAMYCIN PHOSPHATE 1 % EX GEL: Freq: Two times a day (BID) | CUTANEOUS | 1 refills | Status: DC

## 2018-07-10 NOTE — Progress Notes (Signed)
Chief Complaint  Patient presents with  . Follow-up    odor    Subjective: Patient is a 78 y.o. male here for body odor.  Pt has body odor from head and underarms. Has been going on for 2-3 mo. He notices it to an extent, his wife is very sensitive. He showers at least daily. No new changes in diet or topicals. He feels it is not related to sweating, unsure where it may be coming from other than head. Does not believe that he has it now. No pain or skin lesions.  He did start armodafinil and Flomax around time this started.    ROS: Skin: As noted in HPI  Past Medical History:  Diagnosis Date  . Arthritis   . CAD (coronary artery disease)   . Depression   . Diabetes mellitus without complication ([place])   . Heart disease   . Hyperlipidemia   . Hypertension   . Thyroid disease     Objective: BP 128/76 (BP Location: Left Arm, Patient Position: Sitting, Cuff Size: Large)   Pulse 81   Temp 98.1 F (36.7 C) (Oral)   Ht 5\' 8"  (1.727 m)   Wt 240 lb 6 oz (109 kg)   SpO2 98%   BMI 36.55 kg/m  General: Awake, appears stated age Skin: No noticeable lesions related to problem at hand Lungs: No accessory muscle use Psych: Age appropriate judgment and insight, normal affect and mood  Assessment and Plan: Bromhidrosis - Plan: clindamycin (CLINDAGEL) 1 % gel, checked with AE's for new meds, not coming up as common AE. Will tx for bacteria, if no improvement, will consider derm referral.   F/u as originally scheduled.  The patient voiced understanding and agreement to the plan.  [place], DO 07/10/18  9:36 AM

## 2018-07-10 NOTE — Patient Instructions (Signed)
If this does not turn the corner, I would reach out to your dermatologist.  You could also ask your urologist/neurologist if this is a medication concern.   Let us know if you need anything.

## 2018-07-14 ENCOUNTER — Ambulatory Visit (INDEPENDENT_AMBULATORY_CARE_PROVIDER_SITE_OTHER): Payer: Medicare (Managed Care) | Admitting: Psychology

## 2018-07-14 DIAGNOSIS — F3289 Other specified depressive episodes: Secondary | ICD-10-CM | POA: Diagnosis not present

## 2018-07-28 ENCOUNTER — Ambulatory Visit (INDEPENDENT_AMBULATORY_CARE_PROVIDER_SITE_OTHER): Payer: Medicare (Managed Care) | Admitting: Psychology

## 2018-07-28 DIAGNOSIS — F3289 Other specified depressive episodes: Secondary | ICD-10-CM | POA: Diagnosis not present

## 2018-07-29 ENCOUNTER — Other Ambulatory Visit: Payer: Self-pay | Admitting: Family Medicine

## 2018-08-11 ENCOUNTER — Ambulatory Visit (INDEPENDENT_AMBULATORY_CARE_PROVIDER_SITE_OTHER): Payer: Medicare (Managed Care) | Admitting: Psychology

## 2018-08-11 DIAGNOSIS — F3289 Other specified depressive episodes: Secondary | ICD-10-CM | POA: Diagnosis not present

## 2018-08-17 ENCOUNTER — Other Ambulatory Visit: Payer: Self-pay | Admitting: *Deleted

## 2018-08-17 MED ORDER — SIMVASTATIN 40 MG PO TABS: 40.0000 mg | ORAL_TABLET | Freq: Every day | ORAL | 3 refills | Status: DC

## 2018-08-26 ENCOUNTER — Ambulatory Visit (INDEPENDENT_AMBULATORY_CARE_PROVIDER_SITE_OTHER): Payer: Medicare (Managed Care) | Admitting: Psychology

## 2018-08-26 DIAGNOSIS — F3289 Other specified depressive episodes: Secondary | ICD-10-CM

## 2018-09-02 ENCOUNTER — Telehealth: Payer: Self-pay | Admitting: Family Medicine

## 2018-09-02 DIAGNOSIS — F418 Other specified anxiety disorders: Secondary | ICD-10-CM

## 2018-09-02 MED ORDER — DULOXETINE HCL 60 MG PO CPEP: 60.0000 mg | ORAL_CAPSULE | Freq: Every day | ORAL | 0 refills | Status: DC

## 2018-09-02 NOTE — Telephone Encounter (Signed)
Sent in refill

## 2018-09-02 NOTE — Telephone Encounter (Signed)
Patient requesting a 90 day supply DULoxetine (CYMBALTA) 60 MG capsule , informed please allow 48 to 72 hour turn around

## 2018-09-09 ENCOUNTER — Ambulatory Visit (INDEPENDENT_AMBULATORY_CARE_PROVIDER_SITE_OTHER): Payer: Medicare (Managed Care) | Admitting: Psychology

## 2018-09-09 DIAGNOSIS — F3289 Other specified depressive episodes: Secondary | ICD-10-CM

## 2018-09-14 ENCOUNTER — Other Ambulatory Visit: Payer: Self-pay | Admitting: Family Medicine

## 2018-09-14 MED ORDER — LIOTHYRONINE SODIUM 5 MCG PO TABS: 5.0000 ug | ORAL_TABLET | Freq: Every day | ORAL | 0 refills | Status: DC

## 2018-09-14 NOTE — Telephone Encounter (Signed)
Looks like this has not been filled in a while?

## 2018-09-16 ENCOUNTER — Other Ambulatory Visit: Payer: Self-pay | Admitting: Family Medicine

## 2018-09-16 DIAGNOSIS — F418 Other specified anxiety disorders: Secondary | ICD-10-CM

## 2018-09-18 ENCOUNTER — Other Ambulatory Visit: Payer: Self-pay | Admitting: Family Medicine

## 2018-09-18 MED ORDER — TAMSULOSIN HCL 0.4 MG PO CAPS: 0.4000 mg | ORAL_CAPSULE | Freq: Every day | ORAL | 3 refills | Status: DC

## 2018-09-19 ENCOUNTER — Encounter: Payer: Self-pay | Admitting: Family Medicine

## 2018-09-22 ENCOUNTER — Other Ambulatory Visit: Payer: Self-pay | Admitting: Family Medicine

## 2018-09-22 MED ORDER — LIOTHYRONINE SODIUM 5 MCG PO TABS: 10.0000 ug | ORAL_TABLET | Freq: Every day | ORAL | 3 refills | Status: DC

## 2018-09-29 ENCOUNTER — Ambulatory Visit: Payer: Medicare (Managed Care) | Admitting: Psychology

## 2018-09-29 ENCOUNTER — Encounter: Payer: Self-pay | Admitting: Family Medicine

## 2018-10-02 ENCOUNTER — Telehealth: Payer: Self-pay | Admitting: Family Medicine

## 2018-10-02 NOTE — Telephone Encounter (Signed)
He has an order from his neurologist from [facility] Dr. Lora Havens and he is wanting the test Ferritin checked I did explain to the patient we would need the paperwork provided by this MD along with fax number for results to be sent to

## 2018-10-02 NOTE — Telephone Encounter (Signed)
I'm not sure what blood test he is referring to? Most recently was discussion of enlarged prostate therapy, but there is no test I would be ordering for that. Ty.

## 2018-10-02 NOTE — Telephone Encounter (Signed)
Copied from [place] [id number]. Topic: Appointment Scheduling - Scheduling Inquiry for Clinic >> Oct 02, 2018 11:12 AM Erick Blinks wrote: Pt states that he spoke with PCP and was approved for a blood test, pt wants to come in today. Tried calling office, please advise Best contact: [phone]

## 2018-10-07 ENCOUNTER — Encounter: Payer: Self-pay | Admitting: Family Medicine

## 2018-10-08 ENCOUNTER — Telehealth: Payer: Self-pay

## 2018-10-08 ENCOUNTER — Ambulatory Visit (INDEPENDENT_AMBULATORY_CARE_PROVIDER_SITE_OTHER): Payer: Medicare (Managed Care) | Admitting: Psychology

## 2018-10-08 DIAGNOSIS — F3289 Other specified depressive episodes: Secondary | ICD-10-CM

## 2018-10-08 NOTE — Telephone Encounter (Signed)
Pt was referred for Neurocognitive Testing at [facility] in March. Per [facility] pt did not keep appt that was scheduled for 10/22/18.  Mychart message sent to see if pt would like to be scheduled with Dr. Melvyn Novas in our office.

## 2018-10-09 ENCOUNTER — Telehealth: Payer: Self-pay | Admitting: Family Medicine

## 2018-10-09 ENCOUNTER — Other Ambulatory Visit: Payer: Self-pay

## 2018-10-09 ENCOUNTER — Other Ambulatory Visit (INDEPENDENT_AMBULATORY_CARE_PROVIDER_SITE_OTHER): Payer: 59

## 2018-10-09 ENCOUNTER — Other Ambulatory Visit: Payer: Self-pay | Admitting: Family Medicine

## 2018-10-09 DIAGNOSIS — R413 Other amnesia: Secondary | ICD-10-CM | POA: Diagnosis not present

## 2018-10-09 LAB — IBC + FERRITIN
Ferritin: 64 ng/mL (ref 22.0–322.0)
Iron: 105 ug/dL (ref 42–165)
Saturation Ratios: 28.5 % (ref 20.0–50.0)
Transferrin: 263 mg/dL (ref 212.0–360.0)

## 2018-10-09 NOTE — Telephone Encounter (Signed)
Pt dropped off copy of his visit at [facility] Neuro results for provider to have on pt's chart.

## 2018-10-20 ENCOUNTER — Encounter: Payer: Self-pay | Admitting: Family Medicine

## 2018-10-23 ENCOUNTER — Ambulatory Visit (INDEPENDENT_AMBULATORY_CARE_PROVIDER_SITE_OTHER): Payer: Medicare (Managed Care) | Admitting: Psychology

## 2018-10-23 DIAGNOSIS — F3289 Other specified depressive episodes: Secondary | ICD-10-CM | POA: Diagnosis not present

## 2018-11-02 ENCOUNTER — Other Ambulatory Visit: Payer: Self-pay

## 2018-11-02 ENCOUNTER — Ambulatory Visit (INDEPENDENT_AMBULATORY_CARE_PROVIDER_SITE_OTHER): Payer: 59 | Admitting: Neurology

## 2018-11-02 ENCOUNTER — Encounter: Payer: Self-pay | Admitting: Neurology

## 2018-11-02 VITALS — BP 127/85 | HR 79 | Ht 68.0 in | Wt 248.1 lb

## 2018-11-02 DIAGNOSIS — G3184 Mild cognitive impairment, so stated: Secondary | ICD-10-CM

## 2018-11-02 DIAGNOSIS — R4689 Other symptoms and signs involving appearance and behavior: Secondary | ICD-10-CM

## 2018-11-02 DIAGNOSIS — R4189 Other symptoms and signs involving cognitive functions and awareness: Secondary | ICD-10-CM

## 2018-11-02 NOTE — Progress Notes (Signed)
NEUROLOGY FOLLOW UP OFFICE NOTE  Grey Turknett HC:[id number] [birth year]  HISTORY OF PRESENT ILLNESS: I had the pleasure of seeing Raymond Christensen in follow-up in the neurology clinic on 11/02/2018.  The patient was last seen 7 months ago for mild cognitive impairment. He is alone in the office today. MOCA score 23/30 in March 2020. Records and images were personally reviewed where available.  I personally reviewed MRI brain done 03/2018 which did not show any acute changes. There was note of bifrontal extra-axial fluid collections, 43mm on right, 44mm on left, consistent with bifrontal CSF hygromas, mild flattening of frontal lobes bilaterally. CSF hygromas also seen over the cerebellar hemispheres bilaterally. There was moderate diffuse atrophy, minimal chronic microvascular disease. Since his last visit, he thinks his memory is okay. His wife continues to express concern and frustration with his memory, "she does not think I have a memory." He states if it is not important to him, he does not store the information, which upsets her. He again reports a lot of marital discord, he gets quite agitated with his wife quite frequently (more than once a week) where he perceives he is being overly controlled and watched, which leads to yelling. He has started seeing a psychologist. He is asking if medications can cause his behavior issues, however the behavior is almost exclusively with his wife. He denies getting lost driving. He denies missing medications. He denies missing bills, then states his wife does not like the way he does things so she goes back and checks the checkbook, "not okay according to her way of doing things." He denies any headaches, dizziness, focal numbness/tingling/weakness, no falls. Sleep is good. On his last visit he was taking Donepezil, he stopped it since he did not feel he needed it. No side effects.   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 He reports his memory is worsening, "especially if you speak with my wife who can see more of it." Several times during the visit, he and his wife would have disagreements and would roll each others eyes or defend themselves. He states he does not remember things as well as he used to. He has gotten lost driving a few times over the past month, most recently last Saturday. He would take the wrong turn. He states it is mostly at night but his wife shakes her head. His wife states he refuses to use GPS. [place] were coming home from [place] and he was not sure which way to turn, or took the wrong exit off the highway. He gets so angry when he is in the car that she gets nervous. He states "I never do anything right." He rolls his eyes several times when she reports that he does not get the car washed regularly, and forgot to take the deer alerts when he went for a car wash and they broke. He continues to manage bills, but 2 months ago she noticed he was not balancing his checkbook right. There have been 3 incidents with his checkbook. Last week they found out there was no money in the checking account, they went to the bank and he could not understand why. He states what he did not understand at that point was that he had overdone withdrawals. He manages his medications independently and denies missing doses. He has always had a hard time multitasking, and this is  probably worse. His wife agrees. Over the past 6 months, he has been repeating himself more, asking what time his appointment is repeatedly. Over the past year, she has noticed his handwriting has become more scribbly, he thinks he writes the same way.She notes increased irritability over the past year, with a lot of anger and yelling. She complains about his lack of exercise and lack of initiative. No paranoia or hallucinations. Sleep is  good. He was recently started on Donepezil 5mg  daily, no side effects.  He has been previously evaluated 2 centers for memory loss. 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 He had repeat testing at [facility] in 2016 with weakness in working memory and some attention, but testing within normal range compared to peers, no decline noted, and it was thought that his cognitive weakness was due to mood issues (depression). He was started on Duloxetine with some good response. He again had normal cognitive testing in February 2019 and duloxetine dose was further increased. His wife continued to report lack of empathy and initiative. His last MOCA in April 2019 was 28/30. He had a PET scan in 2019 which showed relatively symmetric uptake of FDG in the brain with no definite patterns of abnormal FDG distribution identified. His last MRI brain was in 2016 showing moderate global atrophy, prominent bifrontal subarachnoid spaces, minimal chronic microvascular disease.   He denies any headaches, dizziness, vision changes, dysarthria/dysphagia, neck/back pain, focal numbness/tingling/weakness, bowel/ dysfunction, anosmia, or tremors. He has "semi-incontinence with urine." He uses modafinil for excessive daytime sleepiness and has a CPAP for sleep apnea. He reports mood is okay in general but he does admit to getting irritated easily. He states he feels like he is being overprotected by his wife "who is micromanaging me."    PAST MEDICAL HISTORY: Past Medical History:  Diagnosis Date   Arthritis    CAD (coronary artery disease)    Depression    Diabetes mellitus without complication ([place])    Heart disease    Hyperlipidemia    Hypertension    Thyroid disease     MEDICATIONS: Current Outpatient Medications on File Prior to Visit  Medication Sig Dispense Refill    Armodafinil 250 MG tablet Take by mouth.     aspirin EC 81 MG tablet Take by mouth.     chlorthalidone (HYGROTON) 25 MG tablet Take 0.5 tablets (12.5 mg total) by mouth daily. 45 tablet 1   Cholecalciferol (VITAMIN D3) 10000 units TABS Take by mouth.     Cyanocobalamin (VITAMIN B-12 PO) Take by mouth.     DULoxetine (CYMBALTA) 30 MG capsule TAKE 1 CAPSULE IN THE      EVENING AFTER THE 60MG      CAPSULE IN THE MORNING 90 capsule 1   DULoxetine (CYMBALTA) 60 MG capsule Take 1 capsule (60 mg total) by mouth daily. 90 capsule 0   ezetimibe (ZETIA) 10 MG tablet TAKE 1 TABLET DAILY 90 tablet 1   finasteride (PROSCAR) 5 MG tablet TAKE 1 TABLET DAILY 90 tablet 1   levocetirizine (XYZAL) 5 MG tablet TAKE 1 TABLET(5 MG) BY MOUTH EVERY EVENING 90 tablet 1   levothyroxine (SYNTHROID, LEVOTHROID) 75 MCG tablet Take 1 tablet (75 mcg total) by mouth daily. 90 tablet 1   liothyronine (CYTOMEL) 5 MCG tablet Take 2 tablets (10 mcg total) by mouth daily. 180 tablet 3   metoprolol succinate (TOPROL-XL)  50 MG 24 hr tablet Take by mouth.     Multiple Vitamins-Minerals (MULTIVITAMIN ADULT EXTRA C PO) Take by mouth.     omega-3 acid ethyl esters (LOVAZA) 1 g capsule Take 2 capsules (2 g total) by mouth 2 (two) times daily. 360 capsule 2   ONETOUCH DELICA LANCETS 99991111 MISC FSBS daily     ramipril (ALTACE) 10 MG capsule Take 1 capsule (10 mg total) by mouth 2 (two) times daily. 180 capsule 3   simvastatin (ZOCOR) 40 MG tablet Take 1 tablet (40 mg total) by mouth daily at 6 PM. 90 tablet 3   tamsulosin (FLOMAX) 0.4 MG CAPS capsule Take 1 capsule (0.4 mg total) by mouth daily. 90 capsule 3   Zinc 50 MG TABS Take by mouth.     No current facility-administered medications on file prior to visit.     ALLERGIES: Allergies  Allergen Reactions   Sulfa Antibiotics Rash    Questionable, he developed a diffuse rash 2 days after stopping Bactrim    FAMILY HISTORY: Family History  Problem Relation Age of  Onset   Cancer Neg Hx     SOCIAL HISTORY: Social History   Socioeconomic History   Marital status: Married    Spouse name: Not on file   Number of children: Not on file   Years of education: Not on file   Highest education level: Not on file  Occupational History   Not on file  Social Needs   Financial resource strain: Not on file   Food insecurity    Worry: Not on file    Inability: Not on file   Transportation needs    Medical: Not on file    Non-medical: Not on file  Tobacco Use   Smoking status: Former Smoker    Types: Cigarettes   Smokeless tobacco: Never Used  Substance and Sexual Activity   Alcohol use: Yes    Comment: twice per week   Drug use: No   Sexual activity: Yes  Lifestyle   Physical activity    Days per week: Not on file    Minutes per session: Not on file   Stress: Not on file  Relationships   Social connections    Talks on phone: Not on file    Gets together: Not on file    Attends religious service: Not on file    Active member of club or organization: Not on file    Attends meetings of clubs or organizations: Not on file    Relationship status: Not on file   Intimate partner violence    Fear of current or ex partner: Not on file    Emotionally abused: Not on file    Physically abused: Not on file    Forced sexual activity: Not on file  Other Topics Concern   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 [employer]: Constitutional: No fevers, chills, or sweats, no generalized fatigue, change in appetite Eyes: No visual changes, double vision, eye pain Ear, nose and throat: No hearing loss, ear pain, nasal congestion, sore throat Cardiovascular: No chest pain, palpitations Respiratory:  No shortness of breath at rest or with exertion,  wheezes GastrointestinaI: No nausea, vomiting, diarrhea, abdominal pain, fecal incontinence Genitourinary:  No  dysuria, urinary retention or frequency Musculoskeletal:  No neck pain, back pain Integumentary: No rash, pruritus, skin lesions Neurological: as above Psychiatric: No depression, insomnia, anxiety Endocrine: No palpitations, fatigue, diaphoresis, mood swings, change in appetite, change in weight, increased thirst Hematologic/Lymphatic:  No anemia, purpura, petechiae. Allergic/Immunologic: no itchy/runny eyes, nasal congestion, recent allergic reactions, rashes  PHYSICAL EXAM: Vitals:   11/02/18 1126  BP: 127/85  Pulse: 79  SpO2: 98%   General: No acute distress Head:  Normocephalic/atraumatic Skin/Extremities: No rash, no edema Neurological Exam: alert and oriented to person, place, and time. No aphasia or dysarthria. Fund of knowledge is appropriate.  Recent and remote memory are intact.  Attention and concentration are normal.    Able to name objects and repeat phrases.  Smoot Mental Exam 11/02/2018  Weekday Correct 1  Current year 1  What state are we in? 1  Amount spent 1  Amount left 0  # of Animals 3  5 objects recall 3  Number series 1  Hour markers 2  Time correct 2  Placed X in triangle correctly 1  Largest Figure 1  Name of male 2  Date back to work 1  Type of work 2  State she lived in 2  Total score 24   Cranial nerves: Pupils equal, round, reactive to light.  Extraocular movements intact with no nystagmus. Visual fields full. Facial sensation intact. No facial asymmetry. Tongue, uvula, palate midline.  Motor: Bulk and tone normal, muscle strength 5/5 throughout with no pronator drift.Finger to nose testing intact.  Gait narrow-based and steady, mild difficulty with tandem walk.  Romberg negative.  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 He has had 2 Neuropsychological evaluations in 2013 and 2016. Testing in 2013 indicated "mild neurocognitive disorder due to multiple etiology" noting his cardiovascular health and history of multiple falls. Repeat testing at [facility] in 2016 showed weakness in working memory and some attention, but testing within normal range compared to peers, no decline noted, and it was thought that his cognitive weakness was due to mood issues (depression). His SLUMS score today is 24/30, indicating Mild cognitive Impairment (MOCA 23/30 in March 2020). His MRI brain did not show any acute changes, there was note of bifrontal extra-axial fluid collections, 19mm on right, 29mm on left, consistent with bifrontal CSF hygromas, mild flattening of frontal lobes bilaterally. CSF hygromas also seen over the cerebellar hemispheres bilaterally. We discussed repeating Neurocognitive testing to further evaluate memory complaints, as well as behavioral changes (?frontal lobe dysfunction). He does not feel it is necessary and thought the last 2 tests were a waste of time. I discussed with him how potentially helpful this may be moving forward, he will let us know if he would like to proceed. I also discussed how stress/mood can affect memory, he again reports significant marital discord, continue psychotherapy, consider marriage counseling. He stopped Donepezil. We again discussed the importance of control of vascular risk factors, physical exercise, and brain stimulation exercises for brain health. Follow-up in 6 months, they know to call for any changes.    Thank you for allowing me to participate in his care.  Please do not hesitate to call for any questions or concerns.  The duration of this appointment visit was 30 minutes of face-to-face time with the patient.  Greater than 50% of this time was spent in counseling, explanation of diagnosis, planning of further management, and  coordination of care.   Ellouise Newer,  M.D.   CC: Dr. Nani Ravens

## 2018-11-02 NOTE — Patient Instructions (Signed)
1. Please call our office if you would like to move forward with Neurocognitive testing  2. Continue with psychotherapy, consider marriage counseling as well  3. Follow-up in 6 months, call for any changes    RECOMMENDATIONS FOR ALL PATIENTS WITH MEMORY PROBLEMS: 1. Continue to exercise (Recommend 30 minutes of walking everyday, or 3 hours every week) 2. Increase social interactions - continue going to [place] and enjoy social gatherings with friends and family 3. Eat healthy, avoid fried foods and eat more fruits and vegetables 4. Maintain adequate blood pressure, blood sugar, and blood cholesterol level. Reducing the risk of stroke and cardiovascular disease also helps promoting better memory. 5. Avoid stressful situations. Live a simple life and avoid aggravations. Organize your time and prepare for the next day in anticipation. 6. Sleep well, avoid any interruptions of sleep and avoid any distractions in the bedroom that may interfere with adequate sleep quality 7. Avoid sugar, avoid sweets as there is a strong link between excessive sugar intake, diabetes, and cognitive impairment The Mediterranean diet has been shown to help patients reduce the risk of progressive memory disorders and reduces cardiovascular risk. This includes eating fish, eat fruits and green leafy vegetables, nuts like almonds and hazelnuts, walnuts, and also use olive oil. Avoid fast foods and fried foods as much as possible. Avoid sweets and sugar as sugar use has been linked to worsening of memory function.

## 2018-11-06 ENCOUNTER — Ambulatory Visit (INDEPENDENT_AMBULATORY_CARE_PROVIDER_SITE_OTHER): Payer: Medicare (Managed Care) | Admitting: Psychology

## 2018-11-06 DIAGNOSIS — F3289 Other specified depressive episodes: Secondary | ICD-10-CM | POA: Diagnosis not present

## 2018-11-17 ENCOUNTER — Telehealth: Payer: Self-pay | Admitting: Neurology

## 2018-11-17 DIAGNOSIS — R4689 Other symptoms and signs involving appearance and behavior: Secondary | ICD-10-CM

## 2018-11-17 DIAGNOSIS — G3184 Mild cognitive impairment, so stated: Secondary | ICD-10-CM

## 2018-11-17 DIAGNOSIS — R4189 Other symptoms and signs involving cognitive functions and awareness: Secondary | ICD-10-CM

## 2018-11-17 DIAGNOSIS — R413 Other amnesia: Secondary | ICD-10-CM

## 2018-11-17 NOTE — Telephone Encounter (Signed)
Patient was calling in about neuro cognitive testing. I see a referral for outgoing testing. Is he supposed to be testing in our office or with some other facility? Patient wasn't sure. Thanks!

## 2018-11-18 NOTE — Telephone Encounter (Signed)
Referral place for Dr. Melvyn Novas

## 2018-11-18 NOTE — Telephone Encounter (Signed)
Pls schedule Neuropsych with Dr. Melvyn Novas, thanks! Caryl Pina, pls put in order, thanks!

## 2018-11-19 ENCOUNTER — Ambulatory Visit (INDEPENDENT_AMBULATORY_CARE_PROVIDER_SITE_OTHER): Payer: Medicare (Managed Care) | Admitting: Psychology

## 2018-11-19 DIAGNOSIS — F3289 Other specified depressive episodes: Secondary | ICD-10-CM | POA: Diagnosis not present

## 2018-11-20 ENCOUNTER — Ambulatory Visit: Payer: Medicare (Managed Care) | Admitting: Psychology

## 2018-11-27 ENCOUNTER — Ambulatory Visit: Payer: 59 | Admitting: Family Medicine

## 2018-12-01 ENCOUNTER — Ambulatory Visit: Payer: 59 | Admitting: Family Medicine

## 2018-12-01 ENCOUNTER — Encounter: Payer: Self-pay | Admitting: Family Medicine

## 2018-12-01 ENCOUNTER — Other Ambulatory Visit: Payer: Self-pay

## 2018-12-01 VITALS — BP 132/80 | HR 84 | Temp 95.6°F | Ht 68.0 in | Wt 245.0 lb

## 2018-12-01 DIAGNOSIS — S90221A Contusion of right lesser toe(s) with damage to nail, initial encounter: Secondary | ICD-10-CM | POA: Insufficient documentation

## 2018-12-01 DIAGNOSIS — M6780 Other specified disorders of synovium and tendon, unspecified site: Secondary | ICD-10-CM | POA: Diagnosis not present

## 2018-12-01 DIAGNOSIS — S6010XA Contusion of unspecified finger with damage to nail, initial encounter: Secondary | ICD-10-CM

## 2018-12-01 DIAGNOSIS — F418 Other specified anxiety disorders: Secondary | ICD-10-CM

## 2018-12-01 DIAGNOSIS — T17308A Unspecified foreign body in larynx causing other injury, initial encounter: Secondary | ICD-10-CM

## 2018-12-01 DIAGNOSIS — Z0000 Encounter for general adult medical examination without abnormal findings: Secondary | ICD-10-CM

## 2018-12-01 HISTORY — DX: Contusion of right lesser toe(s) with damage to nail, initial encounter: S90.221A

## 2018-12-01 HISTORY — DX: Other specified disorders of synovium and tendon, unspecified site: M67.80

## 2018-12-01 HISTORY — DX: Contusion of unspecified finger with damage to nail, initial encounter: S60.10XA

## 2018-12-01 LAB — COMPREHENSIVE METABOLIC PANEL
ALT: 36 U/L (ref 0–53)
AST: 28 U/L (ref 0–37)
Albumin: 4.4 g/dL (ref 3.5–5.2)
Alkaline Phosphatase: 58 U/L (ref 39–117)
BUN: 28 mg/dL — ABNORMAL HIGH (ref 6–23)
CO2: 32 mEq/L (ref 19–32)
Calcium: 9.6 mg/dL (ref 8.4–10.5)
Chloride: 103 mEq/L (ref 96–112)
Creatinine, Ser: 1.11 mg/dL (ref 0.40–1.50)
GFR: 64.21 mL/min (ref >60.00)
Glucose, Bld: 91 mg/dL (ref 70–99)
Potassium: 4.8 mEq/L (ref 3.5–5.1)
Sodium: 144 mEq/L (ref 135–145)
Total Bilirubin: 0.5 mg/dL (ref 0.2–1.2)
Total Protein: 6.6 g/dL (ref 6.0–8.3)

## 2018-12-01 LAB — LIPID PANEL
Cholesterol: 148 mg/dL (ref 0–200)
HDL: 42 mg/dL (ref >39.00)
LDL Cholesterol: 83 mg/dL (ref 0–99)
NonHDL: 105.65
Total CHOL/HDL Ratio: 4
Triglycerides: 115 mg/dL (ref 0.0–149.0)
VLDL: 23 mg/dL (ref 0.0–40.0)

## 2018-12-01 LAB — CBC
HCT: 44.2 % (ref 39.0–52.0)
Hemoglobin: 14.3 g/dL (ref 13.0–17.0)
MCHC: 32.4 g/dL (ref 30.0–36.0)
MCV: 89.9 fl (ref 78.0–100.0)
Platelets: 145 10*3/uL — ABNORMAL LOW (ref 150.0–400.0)
RBC: 4.92 Mil/uL (ref 4.22–5.81)
RDW: 14.4 % (ref 11.5–15.5)
WBC: 7 10*3/uL (ref 4.0–10.5)

## 2018-12-01 LAB — T4, FREE: Free T4: 0.67 ng/dL (ref 0.60–1.60)

## 2018-12-01 LAB — TSH: TSH: 4.27 u[IU]/mL (ref 0.35–4.50)

## 2018-12-01 LAB — HEMOGLOBIN A1C: Hgb A1c MFr Bld: 6.7 % — ABNORMAL HIGH (ref 4.6–6.5)

## 2018-12-01 NOTE — Progress Notes (Signed)
Chief Complaint  Patient presents with  . Follow-up    Well Male Raymond Christensen is here for a complete physical.   His last physical was >1 year ago.  Current diet: in general, a "healthy" diet.   Current exercise: swimming Weight trend: stable Daytime fatigue? No. Seat belt? Yes.    Health maintenance Shingrix- Yes Tetanus- Yes Pneumonia vaccine- Yes   Over the last 6 months, the patient has been having intermittent episodes of choking on his saliva.  He does not feel that he is having increased production of saliva.  He is not choking on foods.  It seems to be associated with position when he lies back.  He does not have any difficulty with speech.  The patient has noticed that he has a bruise on his R third toenail.  No injury or change in activity.  He has expected it to start to move out as the toenail grows, but it has not.  He has noticed it over the last 1-2 months.  There is no pain.  He does follow with a dermatologist.  He also notes he has extremely poor flexibility of his lower extremities.  He does not stretch at home but has been swimming lately.  He is interested in seeing a physical therapist.  The patient has a history of anxiety and irritability.  He is currently taking 60 mg of Cymbalta in the morning and 30 mg in the evening.  He has not noticed a significant change for the better, nor has his wife.  He is not having any adverse effects and reports compliance.  Past Medical History:  Diagnosis Date  . Arthritis   . CAD (coronary artery disease)   . Depression   . Diabetes mellitus without complication ([place])   . Heart disease   . Hyperlipidemia   . Hypertension   . Thyroid disease      Past Surgical History:  Procedure Laterality Date  . BYPASS GRAFT  2001  . CAROTID ARTERY ANGIOPLASTY    . TOTAL HIP ARTHROPLASTY  1994, 1995    Medications  Current Outpatient Medications on File Prior to Visit  Medication Sig Dispense Refill  . Armodafinil 250 MG  tablet Take by mouth.    Marland Kitchen aspirin EC 81 MG tablet Take by mouth.    . chlorthalidone (HYGROTON) 25 MG tablet Take 0.5 tablets (12.5 mg total) by mouth daily. 45 tablet 1  . Cholecalciferol (VITAMIN D3) 10000 units TABS Take by mouth.    . Cyanocobalamin (VITAMIN B-12 PO) Take by mouth.    . DULoxetine (CYMBALTA) 30 MG capsule TAKE 1 CAPSULE IN THE      EVENING AFTER THE 60MG     CAPSULE IN THE MORNING 90 capsule 1  . DULoxetine (CYMBALTA) 60 MG capsule Take 1 capsule (60 mg total) by mouth daily. 90 capsule 0  . ezetimibe (ZETIA) 10 MG tablet TAKE 1 TABLET DAILY 90 tablet 1  . finasteride (PROSCAR) 5 MG tablet TAKE 1 TABLET DAILY 90 tablet 1  . levocetirizine (XYZAL) 5 MG tablet TAKE 1 TABLET(5 MG) BY MOUTH EVERY EVENING 90 tablet 1  . levothyroxine (SYNTHROID, LEVOTHROID) 75 MCG tablet Take 1 tablet (75 mcg total) by mouth daily. 90 tablet 1  . liothyronine (CYTOMEL) 5 MCG tablet Take 2 tablets (10 mcg total) by mouth daily. 180 tablet 3  . metoprolol succinate (TOPROL-XL) 50 MG 24 hr tablet Take by mouth.    . Multiple Vitamins-Minerals (MULTIVITAMIN ADULT EXTRA C PO)  Take by mouth.    . omega-3 acid ethyl esters (LOVAZA) 1 g capsule Take 2 capsules (2 g total) by mouth 2 (two) times daily. 360 capsule 2  . ONETOUCH DELICA LANCETS 54M MISC FSBS daily    . ramipril (ALTACE) 10 MG capsule Take 1 capsule (10 mg total) by mouth 2 (two) times daily. 180 capsule 3  . simvastatin (ZOCOR) 40 MG tablet Take 1 tablet (40 mg total) by mouth daily at 6 PM. 90 tablet 3  . tamsulosin (FLOMAX) 0.4 MG CAPS capsule Take 1 capsule (0.4 mg total) by mouth daily. 90 capsule 3  . Zinc 50 MG TABS Take by mouth.     Allergies Allergies  Allergen Reactions  . Sulfa Antibiotics Rash    Questionable, he developed a diffuse rash 2 days after stopping Bactrim   Family History Family History  Problem Relation Age of Onset  . Cancer Neg Hx    Review of Systems: Constitutional:  no fevers or chills Eye:  no  recent significant change in vision Ear/Nose/Mouth/Throat:  Ears:  no recent hearing loss Nose/Mouth/Throat:  no complaints of nasal congestion or sore throat Cardiovascular:  no chest pain Respiratory:  no shortness of breath Gastrointestinal:  no abdominal pain, no change in bowel habits GU:  Male: negative for dysuria, frequency, and incontinence  Musculoskeletal/Extremities: + Poor flexibility; otherwise no pain of the joints Integumentary (Skin): +bruising of R 3rd toe; no abnormal skin lesions reported Neurologic:  no headaches, Endocrine:  No unexpected weight changes  Exam BP 132/80 (BP Location: Left Arm, Patient Position: Sitting, Cuff Size: Normal)   Pulse 84   Temp (!) 95.6 F (35.3 C) (Temporal)   Ht _0  (1.727 m)   Wt 245 lb (111.1 kg)   SpO2 98%   BMI 37.25 kg/m  General:  well developed, well nourished, in no apparent distress Skin: Proximal hematoma noted on the right third digit nailbed, no tenderness to palpation; otherwise no significant moles, warts, or growths Head:  no masses, lesions, or tenderness Eyes:  pupils equal and round, sclera anicteric without injection Ears:  canals without lesions, TMs shiny without retraction, no obvious effusion, no erythema Nose:  nares patent, septum midline, mucosa normal Throat/Pharynx:  lips and gingiva without lesion; tongue and uvula midline; non-inflamed pharynx; no exudates or postnasal drainage Neck: neck supple without adenopathy, thyromegaly, or masses Lungs:  clear to auscultation, breath sounds equal bilaterally, no respiratory distress Cardio:  regular rate and rhythm, no LE edema or bruits Rectal: Deferred Musculoskeletal: Very poor hip flexion/hamstring range of motion; symmetrical muscle groups noted without atrophy or deformity Extremities:  no clubbing, cyanosis, or edema, no deformities, no skin discoloration Neuro:  gait normal; deep tendon reflexes normal and symmetric Psych: well oriented with normal  range of affect and appropriate judgment/insight  Assessment and Plan  Well adult exam - Plan: CBC, Comp Met (CMET), Lipid Profile, HgB A1c, TSH, T4, free  Poor flexibility of tendon - Plan: Ambulatory referral to Physical Therapy  Choking, initial encounter  Anxiety with depression  Subungual hematoma of right foot, initial encounter   Well 78 y.o. male. Counseled on diet and exercise. For his poor flexibility, will set him up with physical therapy.  Yoga recommended. Regarding the choking, I would suggest he sees the SLP team should he become bothersome enough.  He will let us know. Decrease Cymbalta from 90 mg daily to 60 mg daily.  He will let us know how things go in the  next couple weeks.  Will consider adding buspirone if still having issues. Reassurance for his hematoma. Other orders as above. Follow up in 6 mo.  The patient voiced understanding and agreement to the plan.  [place], DO 12/01/18 8:52 AM

## 2018-12-01 NOTE — Patient Instructions (Addendum)
Give [place] 2-3 business days to get the results of your labs back.   Keep the diet clean and stay active.  Aim to do some physical exertion for 150 minutes per week. This is typically divided into 5 days per week, 30 minutes per day. The activity should be enough to get your heart rate up. Anything is better than nothing if you have time constraints. Consider yoga.   The new Shingrix vaccine (for shingles) is a 2 shot series. It can make people feel low energy, achy and almost like they have the flu for 48 hours after injection. Please plan accordingly when deciding on when to get this shot. Call your pharmacy to get this. The second shot of the series is less severe regarding the side effects, but it still lasts 48 hours.   Call your pharmacy to get a tetanus booster shot.  If the swallowing issue gets worse, let me know and we can set you up with the speech team who helps with the swallowing mechanics.   Give the nail issue more time.   If you do not hear anything about your referral in the next 1-2 weeks, call our office and ask for an update.  Let us know if you need anything.

## 2018-12-03 ENCOUNTER — Ambulatory Visit (INDEPENDENT_AMBULATORY_CARE_PROVIDER_SITE_OTHER): Payer: Medicare (Managed Care) | Admitting: Psychology

## 2018-12-03 DIAGNOSIS — F3289 Other specified depressive episodes: Secondary | ICD-10-CM | POA: Diagnosis not present

## 2018-12-14 ENCOUNTER — Ambulatory Visit: Payer: 59 | Attending: Family Medicine | Admitting: Physical Therapy

## 2018-12-14 ENCOUNTER — Encounter: Payer: Self-pay | Admitting: Physical Therapy

## 2018-12-14 ENCOUNTER — Other Ambulatory Visit: Payer: Self-pay

## 2018-12-14 DIAGNOSIS — M25652 Stiffness of left hip, not elsewhere classified: Secondary | ICD-10-CM | POA: Insufficient documentation

## 2018-12-14 DIAGNOSIS — M25651 Stiffness of right hip, not elsewhere classified: Secondary | ICD-10-CM | POA: Insufficient documentation

## 2018-12-14 NOTE — Therapy (Signed)
[address] [place], [place], [address] Phone: [phone]   Fax:  [phone]  Physical Therapy Evaluation  Patient Details  Name: Glendale Openshaw MRN: [id number] Date of Birth: 1940/01/30 Referring Provider (PT): Dr Hart Carwin Rosita Kea   Encounter Date: 12/14/2018  PT End of Session - 12/14/18 1427    Visit Number  1    Number of Visits  8    Date for PT Re-Evaluation  01/11/19    PT Start Time  [id number]    PT Stop Time  1506    PT Time Calculation (min)  38 min    Activity Tolerance  Patient tolerated treatment well    Behavior During Therapy  [facility] for tasks assessed/performed       Past Medical History:  Diagnosis Date  . Arthritis   . CAD (coronary artery disease)   . Depression   . Diabetes mellitus without complication ([place])   . Heart disease   . Hyperlipidemia   . Hypertension   . Thyroid disease     Past Surgical History:  Procedure Laterality Date  . BYPASS GRAFT  2001  . CAROTID ARTERY ANGIOPLASTY    . TOTAL HIP ARTHROPLASTY  1994, 1995    There were no vitals filed for this visit.   Subjective Assessment - 12/14/18 1430    Subjective  Pt reports he was at his annual physical and told his MD about ongoing stiffness in his legs.  MD asked if he wanted to try some therapy to help.  He came here for therapy about 3-4 yrs ago for a combination of things He has noticed as he ages he is more inflexible    Pertinent History  now difficulty donning shoes/socks and picking things up off the floor., bilat THR    Patient Stated Goals  try PT and learn what he can do to settle his symptoms down.    Currently in Pain?  --   no pain only  stiffness in his hips.        [facility] PT Assessment - 12/14/18 0001      Assessment   Medical Diagnosis  LE stiffness    Referring Provider (PT)  Dr Hart Carwin Rosita Kea    Onset Date/Surgical Date  12/13/17    Hand Dominance  Right    Next MD Visit  PRN    Prior Therapy  a couple yrs ago      Precautions   Precautions  None      Balance Screen   Has the patient fallen in the past 6 months  No    Has the patient had a decrease in activity level because of a fear of falling?   No      Home Environment   Living Environment  Private residence    Living Arrangements  Spouse/significant other    Home Layout  One level      Prior Function   Level of [place]  Retired    Leisure  swimming - performs all different strokes. goes 3x/wk      Posture/Postural Control   Posture/Postural Control  Postural limitations    Postural Limitations  Forward head   flat spine     ROM / Strength   AROM / PROM / Strength  AROM;Strength      AROM   AROM Assessment Site  Hip;Knee;Lumbar;Ankle    Right/Left Hip  Right;Left   flex Rt  90, Lt 105   Right Hip Flexion  90    Right Hip External Rotation   40    Right Hip Internal Rotation   25    Left Hip Flexion  105    Left Hip External Rotation   60    Left Hip Internal Rotation   10    Right/Left Knee  --   bilat flex 125 degrees   Right/Left Ankle  --   DF bilat 1   Lumbar Flexion  just below knees    Lumbar Extension  50% present    Lumbar - Right Rotation  WNL    Lumbar - Left Rotation  WNL      Strength   Strength Assessment Site  Hip;Knee;Ankle    Right/Left Knee  --   WNL   Right/Left Ankle  --   WNL     Flexibility   Soft Tissue Assessment /Muscle Length  yes    Hamstrings  supine SLR Rt 35, Lt 42    Quadriceps  prone knee flex Rt 120, Lt 115    ITB  tight bilat     Piriformis  tight       Palpation   Palpation comment  NA during eval                 Objective measurements completed on examination: See above findings.      [facility] Adult PT Treatment/Exercise - 12/14/18 0001      Exercises   Exercises  Knee/Hip      Knee/Hip Exercises: Stretches   Passive Hamstring Stretch  Both;60 seconds   supine with strap   Quad Stretch   Both;60 seconds   prone with strap   Hip Flexor Stretch  Both;60 seconds   seated   ITB Stretch  Both;60 seconds   cross body with strap   Gastroc Stretch  Both;60 seconds   standing at wall            PT Education - 12/14/18 1558    Education Details  HEP, importance of maintaining HEP for flexibility and POC    Person(s) Educated  Patient    Methods  Explanation;Demonstration;Handout    Comprehension  Returned demonstration;Verbalized understanding          PT Long Term Goals - 12/14/18 1604      PT LONG TERM GOAL #1   Title  I with finalized HEP for lower body flexibility ( 01/11/2019)    Time  4    Period  Weeks    Status  New    Target Date  01/11/19      PT LONG TERM GOAL #2   Title  demo bilat hip flexion and IR WNL to allow him to donn/doff socks and shoes easily ( 01/11/2019)    Time  4    Period  Weeks    Status  New    Target Date  01/11/19      PT LONG TERM GOAL #3   Title  increase SLR bilat > 70 degrees ( 01/11/2019)    Time  4    Period  Weeks    Status  New    Target Date  01/11/19      PT LONG TERM GOAL #4   Title  report ability to pick items up off the floor with minimal to no difficulty ( 01/11/2019)    Time  4    Period  Weeks    Status  New  Target Date  01/11/19             Plan - 12/14/18 1600    Clinical Impression Statement  78 yo male returns to therapy d/t reports of stiffness in bilat LEs that is beginning to make it difficult ofr him to perform IADLs.  He is currently swimming 3 times a week however not doing any stretching.  Overall lower body strength is good.  He is tight in bilat hips and low back.  Would benefit from therapy to assist with restoring normal hip ROM and flexibility as well as formalize a good stretching program for him to utilize consistently.    Personal Factors and Comorbidities  Age;Behavior Pattern;Comorbidity 3+    Examination-Activity Limitations  Dressing;Other    Stability/Clinical  Decision Making  Stable/Uncomplicated    Clinical Decision Making  Low    PT Frequency  2x / week    PT Duration  4 weeks    PT Treatment/Interventions  Patient/family education;Functional mobility training;Moist Heat;Ultrasound;Passive range of motion;Therapeutic exercise;Electrical Stimulation;Cryotherapy;Manual techniques;Taping    PT Next Visit Plan  contract relax stretching into the hips, lower body flexibility    Consulted and Agree with Plan of Care  Patient       Patient will benefit from skilled therapeutic intervention in order to improve the following deficits and impairments:  Obesity, Impaired flexibility, Decreased range of motion  Visit Diagnosis: Stiffness of left hip, not elsewhere classified - Plan: PT plan of care cert/re-cert  Stiffness of right hip, not elsewhere classified - Plan: PT plan of care cert/re-cert     Problem List Patient Active Problem List   Diagnosis Date Noted  . Poor flexibility of tendon 12/01/2018  . Subungual hematoma of digit of hand 12/01/2018  . Choking 12/01/2018  . Subungual hematoma of right foot 12/01/2018  . Memory changes 10/15/2017  . Hypothyroidism 10/15/2017  . Anxiety with depression 08/13/2017  . Type 2 diabetes mellitus without complication, without long-term current use of insulin ([place]) 02/26/2017  . Morbid obesity ([place]) 02/26/2017  . Essential hypertension 02/26/2017    Jeral Pinch PT  12/14/2018, 4:09 PM  [address] [place], [place], [address] Phone: [phone]   Fax:  [phone]  Name: Sharbel Cioffi MRN: [id number] Date of Birth: 1940-10-31

## 2018-12-15 ENCOUNTER — Other Ambulatory Visit: Payer: Self-pay | Admitting: Family Medicine

## 2018-12-15 MED ORDER — LEVOTHYROXINE SODIUM 75 MCG PO TABS: 75.0000 ug | ORAL_TABLET | Freq: Every day | ORAL | 1 refills | Status: DC

## 2018-12-18 ENCOUNTER — Other Ambulatory Visit: Payer: Self-pay

## 2018-12-18 ENCOUNTER — Ambulatory Visit: Payer: Medicare (Managed Care) | Attending: Family Medicine | Admitting: Physical Therapy

## 2018-12-18 DIAGNOSIS — M25652 Stiffness of left hip, not elsewhere classified: Secondary | ICD-10-CM | POA: Diagnosis not present

## 2018-12-18 DIAGNOSIS — M25651 Stiffness of right hip, not elsewhere classified: Secondary | ICD-10-CM | POA: Insufficient documentation

## 2018-12-18 NOTE — Therapy (Signed)
[address] [place], [place], [address] Phone: [phone]   Fax:  [phone]  Physical Therapy Treatment  Patient Details  Name: Raymond Christensen MRN: [id number] Date of Birth: 05/07/1940 Referring Provider (PT): Dr Hart Carwin Rosita Kea   Encounter Date: 12/18/2018  PT End of Session - 12/18/18 1103    Visit Number  2    PT Start Time  1022    PT Stop Time  1103    PT Time Calculation (min)  41 min       Past Medical History:  Diagnosis Date  . Arthritis   . CAD (coronary artery disease)   . Depression   . Diabetes mellitus without complication ([place])   . Heart disease   . Hyperlipidemia   . Hypertension   . Thyroid disease     Past Surgical History:  Procedure Laterality Date  . BYPASS GRAFT  2001  . CAROTID ARTERY ANGIOPLASTY    . TOTAL HIP ARTHROPLASTY  1994, 1995    There were no vitals filed for this visit.  Subjective Assessment - 12/18/18 1024    Subjective  pt states he has not been doing stretches at home due to pain and lack of time. he says he will try to start stretching this weekend.    Currently in Pain?  No/denies                       [facility] Adult PT Treatment/Exercise - 12/18/18 0001      Knee/Hip Exercises: Stretches   Passive Hamstring Stretch  Both;4 reps;30 seconds    Piriformis Stretch  Both;4 reps;20 seconds    Other Knee/Hip Stretches  deep K2C, both x 4 30 sec      Knee/Hip Exercises: Aerobic   Nustep  L4 x 6 min      Knee/Hip Exercises: Standing   Hip Abduction  Stengthening;AROM;20 reps;Knee straight   red theraband   Hip Extension  Stengthening;AROM;Both;20 reps;Knee straight   red theraband   Other Standing Knee Exercises  dead lift 3# 2 x 10      Knee/Hip Exercises: Supine   Bridges  Strengthening;AROM;20 reps   on ball   Other Supine Knee/Hip Exercises  K2C w/ ball 2 x 10                  PT Long Term Goals - 12/14/18  1604      PT LONG TERM GOAL #1   Title  I with finalized HEP for lower body flexibility ( 01/11/2019)    Time  4    Period  Weeks    Status  New    Target Date  01/11/19      PT LONG TERM GOAL #2   Title  demo bilat hip flexion and IR WNL to allow him to donn/doff socks and shoes easily ( 01/11/2019)    Time  4    Period  Weeks    Status  New    Target Date  01/11/19      PT LONG TERM GOAL #3   Title  increase SLR bilat > 70 degrees ( 01/11/2019)    Time  4    Period  Weeks    Status  New    Target Date  01/11/19      PT LONG TERM GOAL #4   Title  report ability to pick items up off the floor with minimal to no  difficulty ( 01/11/2019)    Time  4    Period  Weeks    Status  New    Target Date  01/11/19            Plan - 12/18/18 1146    Clinical Impression Statement  pt arrived with only one sock on because he is too tight to reach his foot. pt tight in bilateral LE, but more so of the left side. pt tolerated the passive stretches well and says he will try the active stretches at home. pt has limited motion with dead lifts. pt able to perform hip abduction and extension with red theraband.    Personal Factors and Comorbidities  Age;Behavior Pattern;Comorbidity 3+    Examination-Activity Limitations  Dressing;Other    Stability/Clinical Decision Making  Stable/Uncomplicated    PT Frequency  2x / week    PT Duration  4 weeks    PT Treatment/Interventions  Patient/family education;Functional mobility training;Moist Heat;Ultrasound;Passive range of motion;Therapeutic exercise;Electrical Stimulation;Cryotherapy;Manual techniques;Taping    PT Next Visit Plan  contract relax stretching into the hips, lower body flexibility       Patient will benefit from skilled therapeutic intervention in order to improve the following deficits and impairments:  Obesity, Impaired flexibility, Decreased range of motion  Visit Diagnosis: Stiffness of left hip, not elsewhere  classified  Stiffness of right hip, not elsewhere classified     Problem List Patient Active Problem List   Diagnosis Date Noted  . Poor flexibility of tendon 12/01/2018  . Subungual hematoma of digit of hand 12/01/2018  . Choking 12/01/2018  . Subungual hematoma of right foot 12/01/2018  . Memory changes 10/15/2017  . Hypothyroidism 10/15/2017  . Anxiety with depression 08/13/2017  . Type 2 diabetes mellitus without complication, without long-term current use of insulin ([place]) 02/26/2017  . Morbid obesity ([place]) 02/26/2017  . Essential hypertension 02/26/2017    Barrett Henle, [place] 12/18/2018, 11:54 AM  [address] [place], [place], [address] Phone: [phone]   Fax:  [phone]  Name: Raymond Christensen MRN: [id number] Date of Birth: October 11, 1940

## 2018-12-21 ENCOUNTER — Other Ambulatory Visit: Payer: Self-pay | Admitting: Family Medicine

## 2018-12-22 ENCOUNTER — Other Ambulatory Visit: Payer: Self-pay

## 2018-12-22 ENCOUNTER — Ambulatory Visit (INDEPENDENT_AMBULATORY_CARE_PROVIDER_SITE_OTHER): Payer: Medicare (Managed Care) | Admitting: Psychology

## 2018-12-22 ENCOUNTER — Encounter: Payer: Self-pay | Admitting: Physical Therapy

## 2018-12-22 ENCOUNTER — Ambulatory Visit: Payer: Medicare (Managed Care) | Admitting: Physical Therapy

## 2018-12-22 DIAGNOSIS — M25652 Stiffness of left hip, not elsewhere classified: Secondary | ICD-10-CM | POA: Diagnosis not present

## 2018-12-22 DIAGNOSIS — M25651 Stiffness of right hip, not elsewhere classified: Secondary | ICD-10-CM

## 2018-12-22 DIAGNOSIS — F320 Major depressive disorder, single episode, mild: Secondary | ICD-10-CM

## 2018-12-22 NOTE — Therapy (Signed)
[address] [place], [place], [address] Phone: [phone]   Fax:  [phone]  Physical Therapy Treatment  Patient Details  Name: Raymond Christensen MRN: [id number] Date of Birth: [birth year] Referring Provider (PT): Dr Hart Carwin Rosita Kea   Encounter Date: 12/22/2018  PT End of Session - 12/22/18 1259    Visit Number  3    Number of Visits  8    Date for PT Re-Evaluation  01/11/19    PT Start Time  1301    PT Stop Time  1343    PT Time Calculation (min)  42 min    Activity Tolerance  Patient tolerated treatment well       Past Medical History:  Diagnosis Date  . Arthritis   . CAD (coronary artery disease)   . Depression   . Diabetes mellitus without complication ([place])   . Heart disease   . Hyperlipidemia   . Hypertension   . Thyroid disease     Past Surgical History:  Procedure Laterality Date  . BYPASS GRAFT  2001  . CAROTID ARTERY ANGIOPLASTY    . TOTAL HIP ARTHROPLASTY  1994, 1995    There were no vitals filed for this visit.  Subjective Assessment - 12/22/18 1302    Subjective  Pt reports he isn't feeling as bad as initally He is working on his exercises and feels like the piriformis stretch helps a lot.    Pertinent History  now difficulty donning shoes/socks and picking things up off the floor., bilat THR    Patient Stated Goals  try PT and learn what he can do to settle his symptoms down.    Currently in Pain?  No/denies                       [facility] Adult PT Treatment/Exercise - 12/22/18 0001      Knee/Hip Exercises: Stretches   Piriformis Stretch Limitations  modified in standing with foot on chair      Knee/Hip Exercises: Aerobic   Nustep  L4 x 6 min      Knee/Hip Exercises: Standing   Other Standing Knee Exercises  15 reps dead lifts with 10# to 6" step to work on HS flexibility, VC for form and to use hip hinge not knees , then 2x10 hip circles,  15 reps dynamic  HS stretch alternating, dynamic inner thigh stretch with side lunges      Knee/Hip Exercises: Supine   Other Supine Knee/Hip Exercises  10 reps windshields in hooklying .       Manual Therapy   Manual Therapy  Passive ROM    Passive ROM  bilat hips - rotation, KTC andhamstings with contract relax               PT Short Term Goals - 12/22/18 1304      PT SHORT TERM GOAL #1   Title  independent with initial HEP    Status  Achieved        PT Long Term Goals - 12/22/18 1304      PT LONG TERM GOAL #1   Title  I with finalized HEP for lower body flexibility ( 01/11/2019)    Status  On-going      PT LONG TERM GOAL #2   Title  demo bilat hip flexion and IR WNL to allow him to donn/doff socks and shoes easily ( 01/11/2019)    Status  On-going  PT LONG TERM GOAL #3   Title  increase SLR bilat > 70 degrees ( 01/11/2019)    Status  On-going      PT LONG TERM GOAL #4   Title  report ability to pick items up off the floor with minimal to no difficulty ( 01/11/2019)    Status  On-going            Plan - 12/22/18 1305    Clinical Impression Statement  Raymond Christensen tolerated dynamic stretching in standing well with slight HHA on machine for balance.  DId well with manaul stretching and contract relax, picking up motion through his hips.  Continues to be tight and would benefit form more tx.    PT Frequency  2x / week    PT Duration  4 weeks    PT Treatment/Interventions  Patient/family education;Functional mobility training;Moist Heat;Ultrasound;Passive range of motion;Therapeutic exercise;Electrical Stimulation;Cryotherapy;Manual techniques;Taping    PT Next Visit Plan  functional lower body flexibility    Consulted and Agree with Plan of Care  Patient       Patient will benefit from skilled therapeutic intervention in order to improve the following deficits and impairments:  Obesity, Impaired flexibility, Decreased range of motion  Visit Diagnosis: Stiffness of left  hip, not elsewhere classified  Stiffness of right hip, not elsewhere classified     Problem List Patient Active Problem List   Diagnosis Date Noted  . Poor flexibility of tendon 12/01/2018  . Subungual hematoma of digit of hand 12/01/2018  . Choking 12/01/2018  . Subungual hematoma of right foot 12/01/2018  . Memory changes 10/15/2017  . Hypothyroidism 10/15/2017  . Anxiety with depression 08/13/2017  . Type 2 diabetes mellitus without complication, without long-term current use of insulin ([place]) 02/26/2017  . Morbid obesity ([place]) 02/26/2017  . Essential hypertension 02/26/2017   Jeral Pinch PT  12/22/2018, 1:43 PM  [address] [place], [place], [address] Phone: [phone]   Fax:  [phone]  Name: Raymond Christensen MRN: [id number] Date of Birth: 07-08-1940

## 2018-12-25 ENCOUNTER — Ambulatory Visit: Payer: Medicare (Managed Care) | Admitting: Physical Therapy

## 2018-12-25 ENCOUNTER — Encounter: Payer: Self-pay | Admitting: Physical Therapy

## 2018-12-25 ENCOUNTER — Other Ambulatory Visit: Payer: Self-pay

## 2018-12-25 DIAGNOSIS — M25652 Stiffness of left hip, not elsewhere classified: Secondary | ICD-10-CM | POA: Diagnosis not present

## 2018-12-25 DIAGNOSIS — M25651 Stiffness of right hip, not elsewhere classified: Secondary | ICD-10-CM

## 2018-12-25 NOTE — Therapy (Signed)
[address] [place], [place], [address] Phone: [phone]   Fax:  [phone]  Physical Therapy Treatment  Patient Details  Name: Raymond Christensen MRN: [id number] Date of Birth: Oct 12, 1940 Referring Provider (PT): Dr Hart Carwin Rosita Kea   Encounter Date: 12/25/2018  PT End of Session - 12/25/18 1009    Visit Number  4    Date for PT Re-Evaluation  01/11/19    PT Start Time  0930    PT Stop Time  [id number]    PT Time Calculation (min)  44 min       Past Medical History:  Diagnosis Date  . Arthritis   . CAD (coronary artery disease)   . Depression   . Diabetes mellitus without complication ([place])   . Heart disease   . Hyperlipidemia   . Hypertension   . Thyroid disease     Past Surgical History:  Procedure Laterality Date  . BYPASS GRAFT  2001  . CAROTID ARTERY ANGIOPLASTY    . TOTAL HIP ARTHROPLASTY  1994, 1995    There were no vitals filed for this visit.  Subjective Assessment - 12/25/18 0936    Subjective  "Its going, not bad after last session"    Currently in Pain?  No/denies                       [facility] Adult PT Treatment/Exercise - 12/25/18 0001      Knee/Hip Exercises: Stretches   Passive Hamstring Stretch  Both;4 reps;30 seconds    Piriformis Stretch  Both;4 reps;20 seconds    Gastroc Stretch  Both;3 reps;20 seconds    Other Knee/Hip Stretches  Single K2C 4 x15 sec each    Other Knee/Hip Stretches  Seated HS stretch 3x 10 sec each      Knee/Hip Exercises: Aerobic   Recumbent Bike  L1 x 4 min     Nustep  L4 x 5 min      Knee/Hip Exercises: Standing   Other Standing Knee Exercises  12in alt box tap     Other Standing Knee Exercises  dead lift 4# 2 x 10 cues to hip hinge not knee flex      Knee/Hip Exercises: Supine   Bridges  Strengthening;AROM;20 reps               PT Short Term Goals - 12/22/18 1304      PT SHORT TERM GOAL #1   Title  independent  with initial HEP    Status  Achieved        PT Long Term Goals - 12/22/18 1304      PT LONG TERM GOAL #1   Title  I with finalized HEP for lower body flexibility ( 01/11/2019)    Status  On-going      PT LONG TERM GOAL #2   Title  demo bilat hip flexion and IR WNL to allow him to donn/doff socks and shoes easily ( 01/11/2019)    Status  On-going      PT LONG TERM GOAL #3   Title  increase SLR bilat > 70 degrees ( 01/11/2019)    Status  On-going      PT LONG TERM GOAL #4   Title  report ability to pick items up off the floor with minimal to no difficulty ( 01/11/2019)    Status  On-going            Plan -  12/25/18 1009    Clinical Impression Statement  Pt was able to complete all of today's interventions. Pt is very tight in his LE noted with all exercises. Cues to hinge at hip and not  bend knees with standing dead lift. Encourage pt to stretch at home more.    Examination-Activity Limitations  Dressing;Other    Stability/Clinical Decision Making  Stable/Uncomplicated    PT Frequency  2x / week    PT Duration  4 weeks    PT Treatment/Interventions  Patient/family education;Functional mobility training;Moist Heat;Ultrasound;Passive range of motion;Therapeutic exercise;Electrical Stimulation;Cryotherapy;Manual techniques;Taping    PT Next Visit Plan  functional lower body flexibility       Patient will benefit from skilled therapeutic intervention in order to improve the following deficits and impairments:  Obesity, Impaired flexibility, Decreased range of motion  Visit Diagnosis: Stiffness of left hip, not elsewhere classified  Stiffness of right hip, not elsewhere classified     Problem List Patient Active Problem List   Diagnosis Date Noted  . Poor flexibility of tendon 12/01/2018  . Subungual hematoma of digit of hand 12/01/2018  . Choking 12/01/2018  . Subungual hematoma of right foot 12/01/2018  . Memory changes 10/15/2017  . Hypothyroidism 10/15/2017   . Anxiety with depression 08/13/2017  . Type 2 diabetes mellitus without complication, without long-term current use of insulin ([place]) 02/26/2017  . Morbid obesity ([place]) 02/26/2017  . Essential hypertension 02/26/2017    Scot Jun, PTA 12/25/2018, 10:13 AM  [address] [place], [place], [address] Phone: [phone]   Fax:  [phone]  Name: Amar Savio MRN: [id number] Date of Birth: 03-08-1940

## 2018-12-28 ENCOUNTER — Ambulatory Visit: Payer: Medicare (Managed Care)

## 2018-12-28 ENCOUNTER — Encounter: Payer: Self-pay | Admitting: Psychology

## 2018-12-28 ENCOUNTER — Ambulatory Visit (INDEPENDENT_AMBULATORY_CARE_PROVIDER_SITE_OTHER): Payer: 59 | Admitting: Psychology

## 2018-12-28 ENCOUNTER — Other Ambulatory Visit: Payer: Self-pay

## 2018-12-28 ENCOUNTER — Encounter

## 2018-12-28 DIAGNOSIS — Z9989 Dependence on other enabling machines and devices: Secondary | ICD-10-CM

## 2018-12-28 DIAGNOSIS — R4189 Other symptoms and signs involving cognitive functions and awareness: Secondary | ICD-10-CM | POA: Diagnosis not present

## 2018-12-28 DIAGNOSIS — F411 Generalized anxiety disorder: Secondary | ICD-10-CM | POA: Diagnosis not present

## 2018-12-28 DIAGNOSIS — F330 Major depressive disorder, recurrent, mild: Secondary | ICD-10-CM | POA: Diagnosis not present

## 2018-12-28 DIAGNOSIS — F329 Major depressive disorder, single episode, unspecified: Secondary | ICD-10-CM | POA: Insufficient documentation

## 2018-12-28 DIAGNOSIS — G4733 Obstructive sleep apnea (adult) (pediatric): Secondary | ICD-10-CM | POA: Insufficient documentation

## 2018-12-28 NOTE — Progress Notes (Signed)
   Psychometrician Note   Darryle Mahfouz completed 100 minutes of neuropsychological testing with technician, Cruzita Lederer, B.S., under the supervision of Dr. Christia Reading, Ph.D., licensed psychologist. The patient did not appear overtly distressed by the testing session, per behavioral observation or via self-report to the technician. Rest breaks were offered.    In considering the patient's current level of functioning, level of presumed impairment, nature of symptoms, emotional and behavioral responses during the interview, level of literacy, and observed level of motivation/effort, a battery of tests was selected and communicated to the psychometrician.   Communication between the psychologist and technician was ongoing throughout the testing session and changes were made as deemed necessary based on patient performance on testing, technician observations and additional pertinent factors such as those listed above.   Josey Schoff will return within approximately two weeks for an interactive feedback session with Dr. Melvyn Novas at which time his test performances, clinical impressions, and treatment recommendations will be reviewed in detail. The patient understands he can contact our office should he require our assistance before this time.  100 minutes were spent face-to-face with patient administering standardized tests and 15 minutes were spent scoring (technician). [CPT [id number], [id number]  This note reflects time spent with the psychometrician and does not include test scores or any clinical interpretations made by Dr. Melvyn Novas. The full report will follow in a separate note.

## 2018-12-28 NOTE — Progress Notes (Signed)
NEUROPSYCHOLOGICAL EVALUATION [place] Department of Neurology  Reason for Referral:   Raymond Christensen is a 78 y.o. Caucasian male referred by Ellouise Newer, M.D., to characterize his current cognitive functioning and assist with diagnostic clarity and treatment planning in the context of subjective cognitive decline.  Assessment and Plan:   Clinical Impression(s): Raymond Christensen' pattern of performance is suggestive of neuropsychological functioning largely within normal limits. Relative weaknesses (i.e., below average normative range) were exhibited across visual encoding (i.e., learning) and on isolated tasks assessing working memory, mental spatial manipulation, and cognitive adaptability; the latter domain, along with visual encoding abilities, represented mild performance declines relative to his 2013 evaluation. Outside of these areas, performance was consistently in the average to above average normative ranges and stable relative to previous performance.   It is important to note that even individuals with above average intellectual functioning are not expected to score in the above average range across all cognitive tests. All individuals have strengths and weaknesses and some degree of intraindividual variability is expected, even among healthy individuals. As such, despite some performances in the below average range, I do not believe that Raymond Christensen warrants a diagnosis of a mild neurocognitive disorder at the present time.   Overall, I am in agreement with the results of his 2016 evaluation, which suggested that ongoing cognitive difficulties are most likely related to a combination of ongoing psychiatric distress (Raymond Christensen scored in the mild range for acute symptoms of both depression and anxiety) and his history of cardiovascular illness (including neuroimaging suggesting a chronic left periventricular infarct). It is likely that a combination of these  factors negatively influence day-to-day cognitive difficulties which Raymond Christensen has been experiencing presently.  Recommendations: A repeat neuropsychological may be considered in the future should Raymond Christensen experienced not cognitive or functional decline. The current evaluation shall serve as a useful baseline (in addition to performance from 2013). This will also aid in future efforts towards improved diagnostic clarity.  Raymond Christensen is encouraged to attend to lifestyle factors for brain health (e.g., regular physical exercise, good nutrition habits, regular participation in cognitively-stimulating activities, and general stress management techniques), which are likely to have benefits for both emotional adjustment and cognition. In fact, in addition to promoting good general health, regular exercise incorporating aerobic activities (e.g., brisk walking, jogging, cycling, etc.) has been demonstrated to be a very effective treatment for depression and stress, with similar efficacy rates to both antidepressant medication and psychotherapy.  Optimal control of vascular risk factors (including safe cardiovascular exercise and adherence to dietary recommendations) is encouraged. Likewise, continued compliance with his CPAP machine will be important. Continued participation in activities which provide mental stimulation and social interaction is also recommended.   If interested, there are some activities which have therapeutic value and can be useful in keeping him cognitively stimulated. For suggestions, Raymond Christensen is encouraged to go to the following website: https://[email or web address].org/get-to-know-barrow/[email or web address]-programs/neurorehabilitation-center/neuro-rehab-apps-and-games/ which has options, categorized by level of difficulty. It should be noted that these activities should not be viewed as a substitute for therapy.  Given his greater difficulty learning and recalling visual information, he might  benefit from verbalizing information to be retained. Additionally, when placed into a contextual organizational structure, his memory was improved. This suggests that when presenting him with information, it is valuable to place it in a meaningful context.  To address problems with working memory and other aspects of executive functioning, he may wish to consider:   -Avoiding external  distractions when needing to concentrate   -Limiting exposure to fast paced environments with multiple sensory demands   -Writing down complicated information and using checklists   -Attempting and completing one task at a time (i.e., no multi-tasking)   -Reducing the amount of information considered at one time  Review of Records:   Raymond Christensen completed a comprehensive neuropsychological evaluation Orion Modest, Ph.D.) across several dates in 2013. Memory difficulties were said to be "relatively minimal." Visuospatial functioning was variable, but often scored below expectation (including some in the impaired normative ranges). Cognitive flexibility was also said to be a relative weakness. Attention/concentration was said to be variable; however, this was possibly due to hearing difficulties. Overall, Raymond Christensen was diagnosed with a mild neurocognitive disorder due to multiple etiologies. Potential etiologies included potential consequences of previous head injuries, as well as vascular involvement associated with a significant history of cardiovascular disease. However, it should be noted that Dr. Kirkland Hun scoring criteria appears abnormal. For example scores in the 9th percentile, typically labeled as "below average" were rated as exhibiting "moderate impairment."   Raymond Christensen was also seen by Neurology at [facility] [place]) on 04/21/2017. This note references an additional neuropsychological evaluation performed in 2016 (these results were unavailable for review). Results in 2016 were said to  suggest mild weaknesses in working memory and some attentional difficulties; however, testing was said to be within normal limites relative to age-matched peers. Pilar Plate cognitive decline was not noted and cognitive weaknesses were thought to be attributed, at least in part, to ongoing mood concerns (i.e., depression) and history of cardiovascular illness. Performance on a brief cognitive screening instrument [facility]) was normal (28/30).  Most recently, Raymond Christensen was seen by [facility] Neurology Ellouise Newer, M.D.) on 11/02/2018 for follow-up of memory concerns. Since his last visit, he described his memory as okay. His wife continued to express concerns and frustration with his memory (e.g., "she does not think I have a memory"). He stated that if it is not important to him, he does not store the information, which upsets her. He reported significant marital discord and he described frequently getting agitated with his wife (i.e., more than once a week) where he perceives he is being overly controlled and watched. He has started seeing a psychologist. Performance across ADLs was largely described as intact. Performance on a brief cognitive screening instrument (SLUMS) was 24/30. Ultimately, he was referred for a comprehensive neuropsychological evaluation to characterize his cognitive abilities and to assist with diagnostic clarity and treatment planning.  Brain MRI on 09/13/2014 revealed global atrophy, moderately advanced for age. Brain MRI on 04/01/2018 revealed bifrontal extra-axial fluid collections (7 mm thick on the right, 10 mm thick on the left), consistent with frontal CSF hygromas; however, these were said to be of uncertain significance. Mild flattening of the frontal lobes bilaterally was also noted. Generalized atrophy with no significant white matter disease was noted, along with a chronic left periventricular infarct.   Past Medical History:  Diagnosis Date  . Arthritis   . CAD (coronary artery  disease)   . Concussion Summer 2012  . Essential hypertension 02/26/2017  . Heart disease   . Hyperlipidemia   . Hypothyroidism 10/15/2017  . Major depressive disorder   . Obstructive sleep apnea on CPAP   . Poor flexibility of tendon 12/01/2018  . Subungual hematoma of digit of hand 12/01/2018  . Subungual hematoma of right foot 12/01/2018  . Thyroid disease   . Type 2 diabetes mellitus  without complication, without long-term current use of insulin ([place]) 02/26/2017    Past Surgical History:  Procedure Laterality Date  . BYPASS GRAFT  2001  . CAROTID ARTERY ANGIOPLASTY    . TOTAL HIP ARTHROPLASTY  1994, 1995    Family History  Problem Relation Age of Onset  . Dementia Father        Concerns surrounding Lewy body dementia, but never officially diagnosed  . Cancer Neg Hx      Current Outpatient Medications:  .  Armodafinil 250 MG tablet, Take by mouth., Disp: , Rfl:  .  aspirin EC 81 MG tablet, Take by mouth., Disp: , Rfl:  .  chlorthalidone (HYGROTON) 25 MG tablet, TAKE 1/2 TABLET (12.5MG )   DAILY, Disp: 45 tablet, Rfl: 1 .  Cholecalciferol (VITAMIN D3) 10000 units TABS, Take by mouth., Disp: , Rfl:  .  Cyanocobalamin (VITAMIN B-12 PO), Take by mouth., Disp: , Rfl:  .  DULoxetine (CYMBALTA) 60 MG capsule, Take 1 capsule (60 mg total) by mouth daily., Disp: 90 capsule, Rfl: 0 .  ezetimibe (ZETIA) 10 MG tablet, TAKE 1 TABLET DAILY, Disp: 90 tablet, Rfl: 1 .  finasteride (PROSCAR) 5 MG tablet, TAKE 1 TABLET DAILY, Disp: 90 tablet, Rfl: 1 .  levocetirizine (XYZAL) 5 MG tablet, TAKE 1 TABLET(5 MG) BY MOUTH EVERY EVENING, Disp: 90 tablet, Rfl: 1 .  levothyroxine (SYNTHROID) 75 MCG tablet, Take 1 tablet (75 mcg total) by mouth daily., Disp: 90 tablet, Rfl: 1 .  liothyronine (CYTOMEL) 5 MCG tablet, Take 2 tablets (10 mcg total) by mouth daily., Disp: 180 tablet, Rfl: 3 .  metoprolol succinate (TOPROL-XL) 50 MG 24 hr tablet, Take by mouth., Disp: , Rfl:  .  Multiple Vitamins-Minerals  (MULTIVITAMIN ADULT EXTRA C PO), Take by mouth., Disp: , Rfl:  .  omega-3 acid ethyl esters (LOVAZA) 1 g capsule, Take 2 capsules (2 g total) by mouth 2 (two) times daily., Disp: 360 capsule, Rfl: 2 .  ONETOUCH DELICA LANCETS [id number] MISC, FSBS daily, Disp: , Rfl:  .  ramipril (ALTACE) 10 MG capsule, Take 1 capsule (10 mg total) by mouth 2 (two) times daily., Disp: 180 capsule, Rfl: 3 .  simvastatin (ZOCOR) 40 MG tablet, Take 1 tablet (40 mg total) by mouth daily at 6 PM., Disp: 90 tablet, Rfl: 3 .  tamsulosin (FLOMAX) 0.4 MG CAPS capsule, Take 1 capsule (0.4 mg total) by mouth daily., Disp: 90 capsule, Rfl: 3 .  Zinc 50 MG TABS, Take by mouth., Disp: , Rfl:   Clinical Interview:   Cognitive Symptoms: Decreased short-term memory: Endorsed. Broadly, Raymond Christensen noted feeling as though he is "not as sharp as [he] used to be." He noted specific difficulties with remembering the details or prior conversations, as well as remembering to perform future tasks (i.e., more prospective memory). These deficits were said to be present for at least the past 2-3 years, but do not seem to be impactful all the time. He theorized that his wife believes they have declined over time; however, he was largely unsure.  Decreased long-term memory: Denied. Decreased attention/concentration: Denied. Increased ease of distractibility: Denied. Reduced processing speed: Endorsed. Difficulties with executive functions: Denied. Difficulties with emotion regulation: Endorsed. Specifically, he noted being quicker to anger or feel frustrated and an increased tendency to react in a strongly negative manner. These were said to be isolated to occasional interactions with his wife.  Difficulties with receptive language: Denied. Difficulties with word finding: Denied. Decreased visuoperceptual ability: Denied.  Difficulties  completing ADLs: Denied.  Additional Medical History: History of traumatic brain injury/concussion: Endorsed.  In 2012, Raymond Christensen noted a possible concussive injury when he slipped on some ice, falling and hitting his head. He also experienced additional events in 2012 where he missed a step and fell, as well as fell coming down a ladder. Losses in consciousness or persisting symptoms were generally denied. He was evaluated in 2012 for balance concerns; these potential concussive injuries also led to his first neuropsychological evaluation in 2013.  History of stroke: Denied. However recent neuroimaging suggested a remote chronic left periventricular infarct. History of seizure activity: Denied. History of known exposure to toxins: Denied. Symptoms of chronic pain: Largely denied. However, he did note decreased flexibility in his lower extremities and often feeling stiff. He was unsure if this was related to a medical condition or just limited use/stretching.  Experience of frequent headaches/migraines: Denied. Frequent instances of dizziness/vertigo: Denied. Instances were acknowledged to occur rarely, generally in the setting of him standing up quickly.   Sensory changes: Raymond Christensen reported seeing well with his glasses. He also reported bilateral use of hearing aids with positive effect. Other sensory changes/difficultness (e.g., taste or smell) were denied.  Balance/coordination difficulties: Denied. He did acknowledge a remote history of falls (see above) and symptoms of syncope. However, these were described to be well managed currently. Recent falls were denied. Other motor difficulties: Denied.  Sleep History: Estimated hours obtained each night: 7-8 hours. Difficulties falling asleep: Denied. Difficulties staying asleep: Denied. Feels rested and refreshed upon awakening: "Not always."  History of snoring: Endorsed. History of waking up gasping for air: Endorsed. Witnessed breath cessation while asleep: Endorsed. Raymond Christensen acknowledged a history of obstructive sleep apnea and utilizes his CPAP  machine regularly.   History of vivid dreaming: Denied. Excessive movement while asleep: Denied. Instances of acting out his dreams: Denied.  Psychiatric/Behavioral Health History: Depression: Endorsed. Raymond Christensen acknowledged a history of mild depression. He was unclear if current medications were beneficial, wondering if he should feel more of an improvement than his current experience. He described his current mood as "calm most of the time." However, he did acknowledge "flare-ups" where he can be quick to anger or frustration. These were said to be exclusive to interactions with his wife; he believes that she feels that he is "not very cognizant" all the time. Current or remote suicidal ideation, intent, or plan was denied.  Anxiety: Denied. Mania: Denied. Trauma History: Denied. Visual/auditory hallucinations: Denied. Delusional thoughts: Denied. Mental health treatment: Endorsed. He reported currently working with an individual therapist/psychologist with some benefit.  Tobacco: Denied. Alcohol: He reported consuming 1 drink approximately 3 times per week. He denied a history of problematic alcohol abuse or dependence.  Recreational drugs: Denied. Caffeine: 2-3 cups of coffee in the morning.  Academic/Vocational History: Highest level of educational attainment: 19 years. Raymond Christensen earned his Ph.D. in Biology. 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 Mathematics was noted as a relative weakness in that it did not come as easy to him; however, performance was still strong.  History of developmental delay: Denied. History of grade repetition: Denied. Enrollment in special education courses: Denied. History of diagnosed specific learning disability: Denied. History of ADHD: Denied.  Employment: Retired. 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   Evaluation Results:   Behavioral  Observations: Raymond Christensen was unaccompanied, arrived to his appointment on time, and was appropriately dressed  and groomed. Observed gait and station were within normal limits. Gross motor functioning appeared intact upon informal observation and no abnormal movements (e.g., tremors) were noted. His affect was generally relaxed and positive, but did range appropriately given the subject being discussed during the clinical interview or the task at hand during testing procedures. Spontaneous speech was fluent and word finding difficulties were not observed during the clinical interview or testing procedures. Sustained attention was appropriate throughout. Thought processes were coherent, organized, and normal in content. Task engagement was adequate and he persisted when challenged. Overall, Raymond Christensen was cooperative with the clinical interview and subsequent testing procedures.   Adequacy of Effort: The validity of neuropsychological testing is limited by the extent to which the individual being tested may be assumed to have exerted adequate effort during testing. Raymond Christensen expressed his intention to perform to the best of his abilities and exhibited adequate task engagement and persistence. Scores across stand-alone and embedded performance validity measures were within expectation. As such, the results of the current evaluation are believed to be a valid representation of Raymond Christensen' current cognitive functioning.  Test Results: Raymond Christensen was fully oriented at the time of the current evaluation.  Intellectual abilities based upon educational and vocational attainment were estimated to be in the above average range. Premorbid abilities were estimated to be within the well above average range based upon a single-word reading test.   Processing speed was within normal limits. Basic attention was average. More complex attention (e.g., working memory) was below average to average. Executive functioning was  largely within normal limits. A relative weakness was exhibited on an unstructured task assessing hypothesis testing and adaptability.  Assessed receptive language abilities were within normal limits. Likewise, Raymond Christensen did not exhibit any difficulties comprehending task instructions and answered all questions asked of him appropriately. Assessed expressive language (e.g., verbal fluency and confrontation naming) was within normal limits.     Assessed visuospatial/visuoconstructional abilities were largely within normal limits. A relative weakness was exhibited across a task assessing mental spatial manipulation and rotation.    Learning (i.e., encoding) of novel verbal information was within normal limits. Visual learning was below average, representing a relative weakness. Spontaneous delayed recall (i.e., retrieval) of previously learned information was commensurate with performance across initial learning trials. Retention rates were appropriate across memory measures; a weakness was exhibited across a visual memory task (retention rate of 67%). Performance across recognition tasks was largely appropriate, suggesting evidence for information consolidation.   Results of emotional screening instruments suggested that recent symptoms of generalized anxiety were in the mild range, while symptoms of depression were also within the mild range. A screening instrument assessing recent sleep quality suggested the presence of minimal sleep dysfunction.  Tables of Scores:   Note: This summary of test scores accompanies the interpretive report and should not be considered in isolation without reference to the appropriate sections in the text. Descriptors are based on appropriate normative data and may be adjusted based on clinical judgment. The terms "impaired" and "within normal limits (WNL)" are used when a more specific level of functioning cannot be determined.       Effort Testing:   DESCRIPTOR         ACS Word Choice: --- --- Within Expectation    *Based on 78 y/o norms     Dot Counting Test: --- --- Within Expectation  WAIS-IV Reliable Digit Span: --- --- Within Expectation  CVLT-III Forced Choice Recognition: --- --- Within Expectation  BVMT-R Retention Percentage: --- --- Within Expectation       Orientation:      Raw Score Percentile   NAB Orientation, Form 1 29/29 --- ---       Intellectual Functioning:           Standard Score Percentile   Test of Premorbid Functioning: [id number] Well Above Average       Memory:          Wechsler Memory Scale (WMS-IV):                       Raw Score (Scaled Score) Percentile     Logical Memory I 31/53 (10) 50 Average    Logical Memory II 18/39 (11) 63 Average    Logical Memory Recognition 21/23 >75 Above Average       [place] Verbal Learning Test (CVLT-III) Brief Form: Raw Score (Scaled/Standard Score) Percentile     Total Trials 1-4 29/36 (112) 79 Above Average    Short-Delay Free Recall 7/9 (10) 50 Average    Long-Delay Free Recall 7/9 (11) 63 Average    Long-Delay Cued Recall 8/9 (13) 84 Above Average      Recognition Hits 9/9 (13) 84 Above Average      False Positive Errors 0 (12) 75 Above Average       Brief Visuospatial Memory Test (BVMT-R), Form 1: Raw Score (T Score) Percentile     Total Trials 1-3 13/36 (38) 12 Below Average    Delayed Recall 4/12 (35) 7 Well Below Average    Recognition Discrimination Index 4 11-16 Below Average      Recognition Hits 4/6 6-10 Well Below Average      False Positive Errors 0 >16 Within Normal Limits        Attention/Executive Function:          Trail Making Test (TMT): Raw Score (T Score) Percentile     Part A 29 secs.,  0 errors (52) 58 Average    Part B 55 secs.,  0 errors (64) 92 Well Above Average        Scaled Score Percentile   WAIS-IV Coding: 12 75 Above Average        Scaled Score Percentile   WAIS-IV Digit Span: 9 37 Average    Forward 10 50 Average    Backward 9  37 Average    Sequencing 6 9 Below Average       D-KEFS Color-Word Interference Test: Raw Score (Scaled Score) Percentile     Color Naming 31 secs. (11) 63 Average    Word Reading 21 secs. (12) 75 Above Average    Inhibition 83 secs. (8) 25 Average      Total Errors 3 errors (10) 50 Average    Inhibition/Switching 88 secs. (9) 37 Average      Total Errors 5 errors (8) 25 Average       D-KEFS 20 Questions Test: Scaled Score Percentile     Total Weighted Achievement Score 14 91 Above Average    Initial Abstraction Score 19 >99 Exceptionally High       [place] Card Sorting Test [facility]): Raw Score Percentile     Categories (trials) 1 (64) 11-16 Below Average    Total Errors 29 16 Below Average    Perseverative Errors 14 31 Average    Non-Perseverative Errors 15 9 Below Average    Failure to Maintain Set 2 --- ---       Language:  Verbal Fluency Test: Raw Score (Z-Score) Percentile     Phonemic Fluency (FAS) 56 (1.16) 88 Above Average    Animal Fluency 16 (-0.52) 31 Average  *Based on [employer] Older Normative Studies (MOANS)          NAB Language Module, Form 2: T Score Percentile     Auditory Comprehension 57 75 Above Average    Naming 30/31 (58) 79 Above Average       Visuospatial/Visuoconstruction:      Raw Score Percentile   Clock Drawing: 10/10 --- Within Normal Limits       NAB Spatial Module, Form 2: T Score Percentile     Figure Drawing Copy 44 27 Average        Scaled Score Percentile   WAIS-IV Block Design: 10 50 Average  WAIS-IV Matrix Reasoning: 10 50 Average  WAIS-IV Visual Puzzles: 7 16 Below Average       Mood and Personality:      Raw Score Percentile   Geriatric Depression Scale: 11 --- Mild  Geriatric Anxiety Scale: 18 --- Mild    Somatic 7 --- Mild    Cognitive 2 --- Minimal    Affective 9 --- Severe       Additional Questionnaires:      Raw Score Percentile   PROMIS Sleep Disturbance Questionnaire: 18 --- None to Slight   Informed  Consent and Coding/Compliance:   Raymond Christensen was provided with a verbal description of the nature and purpose of the present neuropsychological evaluation. Also reviewed were the foreseeable risks and/or discomforts and benefits of the procedure, limits of confidentiality, and mandatory reporting requirements of this provider. The patient was given the opportunity to ask questions and receive answers about the evaluation. Oral consent to participate was provided by the patient.   This evaluation was conducted by Christia Reading, Ph.D., licensed clinical neuropsychologist. Raymond Christensen completed a 30-minute clinical interview, billed as one unit [phone], and 115 minutes of cognitive testing, billed as one unit [phone] and three additional units [id number]. Psychometrist Cruzita Lederer, B.S., assisted Dr. Melvyn Novas with test administration and scoring procedures. As a separate and discrete service, Dr. Melvyn Novas spent a total of 180 minutes in interpretation and report writing, billed as one unit 96132 and two units 96133.

## 2018-12-29 ENCOUNTER — Ambulatory Visit: Payer: Medicare (Managed Care) | Admitting: Physical Therapy

## 2018-12-29 ENCOUNTER — Encounter: Payer: Self-pay | Admitting: Physical Therapy

## 2018-12-29 DIAGNOSIS — M25651 Stiffness of right hip, not elsewhere classified: Secondary | ICD-10-CM

## 2018-12-29 DIAGNOSIS — M25652 Stiffness of left hip, not elsewhere classified: Secondary | ICD-10-CM

## 2018-12-29 NOTE — Therapy (Signed)
[facility] [address] [place], [place], [address] Phone: [phone]   Fax:  [phone]  Physical Therapy Treatment  Patient Details  Name: Raymond Christensen MRN: [id number] Date of Birth: [birth year] Referring Provider (PT): Dr Hart Carwin Rosita Kea   Encounter Date: 12/29/2018  PT End of Session - 12/29/18 1013    Visit Number  5    Number of Visits  8    Date for PT Re-Evaluation  01/11/19    PT Start Time  1013    PT Stop Time  1056    PT Time Calculation (min)  43 min    Activity Tolerance  Patient tolerated treatment well    Behavior During Therapy  [facility] for tasks assessed/performed       Past Medical History:  Diagnosis Date  . Arthritis   . CAD (coronary artery disease)   . Concussion Summer 2012  . Essential hypertension 02/26/2017  . Heart disease   . Hyperlipidemia   . Hypothyroidism 10/15/2017  . Major depressive disorder   . Obstructive sleep apnea on CPAP   . Poor flexibility of tendon 12/01/2018  . Subungual hematoma of digit of hand 12/01/2018  . Subungual hematoma of right foot 12/01/2018  . Thyroid disease   . Type 2 diabetes mellitus without complication, without long-term current use of insulin ([place]) 02/26/2017    Past Surgical History:  Procedure Laterality Date  . BYPASS GRAFT  2001  . CAROTID ARTERY ANGIOPLASTY    . TOTAL HIP ARTHROPLASTY  1994, 1995    There were no vitals filed for this visit.  Subjective Assessment - 12/29/18 1014    Subjective  Pt reports he had trunk/chest pain after PT and wasn't sure what it came from so he didnt' do any exercise.  The pain is gone now.    Patient Stated Goals  try PT and learn what he can do to settle his symptoms down.    Currently in Pain?  No/denies         [facility] PT Assessment - 12/29/18 0001      Assessment   Medical Diagnosis  LE stiffness    Referring Provider (PT)  Dr Hart Carwin Rosita Kea                   [facility] Adult  PT Treatment/Exercise - 12/29/18 0001      Knee/Hip Exercises: Stretches   Hip Flexor Stretch  Both;2 reps;30 seconds   hanging leg off side of leg press ( mat)    Hip Flexor Stretch Limitations  felt more stretch on Rt side     Other Knee/Hip Stretches  foot on side of leg press leaning into stretch, then adding rotation component with holds. 2x30 sec seat piriformis each side, seated FWD lean stretch    Other Knee/Hip Stretches  2x10 standing hip circles in both directions      Knee/Hip Exercises: Aerobic   Nustep  L5 x 5 min      Knee/Hip Exercises: Machines for Strengthening   Cybex Leg Press  3x10, 70#      Knee/Hip Exercises: Standing   Other Standing Knee Exercises  3x10 sumo squats VC for form                PT Short Term Goals - 12/22/18 1304      PT SHORT TERM GOAL #1   Title  independent with initial HEP    Status  Achieved  PT Long Term Goals - 12/22/18 1304      PT LONG TERM GOAL #1   Title  I with finalized HEP for lower body flexibility ( 01/11/2019)    Status  On-going      PT LONG TERM GOAL #2   Title  demo bilat hip flexion and IR WNL to allow him to donn/doff socks and shoes easily ( 01/11/2019)    Status  On-going      PT LONG TERM GOAL #3   Title  increase SLR bilat > 70 degrees ( 01/11/2019)    Status  On-going      PT LONG TERM GOAL #4   Title  report ability to pick items up off the floor with minimal to no difficulty ( 01/11/2019)    Status  On-going            Plan - 12/29/18 1055    Clinical Impression Statement  Pt does well with a combination of active and passive stretching to improve hip mobility.  He is able to get his foot onto his knee and donn socks a little  easier and progressing to his goals.    PT Frequency  2x / week    PT Duration  4 weeks    PT Treatment/Interventions  Patient/family education;Functional mobility training;Moist Heat;Ultrasound;Passive range of motion;Therapeutic exercise;Electrical  Stimulation;Cryotherapy;Manual techniques;Taping    PT Next Visit Plan  functional lower body flexibility    Consulted and Agree with Plan of Care  Patient       Patient will benefit from skilled therapeutic intervention in order to improve the following deficits and impairments:  Obesity, Impaired flexibility, Decreased range of motion  Visit Diagnosis: Stiffness of left hip, not elsewhere classified  Stiffness of right hip, not elsewhere classified     Problem List Patient Active Problem List   Diagnosis Date Noted  . Major depressive disorder   . Obstructive sleep apnea on CPAP   . Poor flexibility of tendon 12/01/2018  . Subungual hematoma of digit of hand 12/01/2018  . Choking 12/01/2018  . Subungual hematoma of right foot 12/01/2018  . Hypothyroidism 10/15/2017  . Type 2 diabetes mellitus without complication, without long-term current use of insulin ([place]) 02/26/2017  . Morbid obesity ([place]) 02/26/2017  . Essential hypertension 02/26/2017    Susna Shave rPT  12/29/2018, 11:00 AM  [address] [place], [place], [address] Phone: [phone]   Fax:  [phone]  Name: Raymond Christensen MRN: [id number] Date of Birth: 08/11/1940

## 2019-01-01 ENCOUNTER — Encounter: Payer: Self-pay | Admitting: Physical Therapy

## 2019-01-01 ENCOUNTER — Ambulatory Visit: Payer: Medicare (Managed Care) | Admitting: Physical Therapy

## 2019-01-01 ENCOUNTER — Other Ambulatory Visit: Payer: Self-pay

## 2019-01-01 DIAGNOSIS — M25652 Stiffness of left hip, not elsewhere classified: Secondary | ICD-10-CM | POA: Diagnosis not present

## 2019-01-01 DIAGNOSIS — M25651 Stiffness of right hip, not elsewhere classified: Secondary | ICD-10-CM

## 2019-01-01 NOTE — Therapy (Signed)
[place] [address] [place], [place], [address] Phone: [phone]   Fax:  [phone]  Physical Therapy Treatment  Patient Details  Name: Raymond Christensen MRN: [id number] Date of Birth: 1940-11-15 Referring Provider (PT): Dr Hart Carwin Rosita Kea   Encounter Date: 01/01/2019  PT End of Session - 01/01/19 0940    Visit Number  6    Number of Visits  8    Date for PT Re-Evaluation  01/11/19    PT Start Time  4585   in late   PT Stop Time  1013    PT Time Calculation (min)  35 min    Activity Tolerance  Patient tolerated treatment well    Behavior During Therapy  [facility] for tasks assessed/performed       Past Medical History:  Diagnosis Date  . Arthritis   . CAD (coronary artery disease)   . Concussion Summer 2012  . Essential hypertension 02/26/2017  . Heart disease   . Hyperlipidemia   . Hypothyroidism 10/15/2017  . Major depressive disorder   . Obstructive sleep apnea on CPAP   . Poor flexibility of tendon 12/01/2018  . Subungual hematoma of digit of hand 12/01/2018  . Subungual hematoma of right foot 12/01/2018  . Thyroid disease   . Type 2 diabetes mellitus without complication, without long-term current use of insulin ([place]) 02/26/2017    Past Surgical History:  Procedure Laterality Date  . BYPASS GRAFT  2001  . CAROTID ARTERY ANGIOPLASTY    . TOTAL HIP ARTHROPLASTY  1994, 1995    There were no vitals filed for this visit.  Subjective Assessment - 01/01/19 0939    Subjective  pt reports he hasn't been doing his HEP or swimming lately because he has been lazy    Patient Stated Goals  try PT and learn what he can do to settle his symptoms down.    Currently in Pain?  No/denies         [facility] PT Assessment - 01/01/19 0001      Assessment   Medical Diagnosis  LE stiffness      Flexibility   Hamstrings  supine SLR Rt 74, Lt 62                   [facility] Adult PT Treatment/Exercise -  01/01/19 0001      Knee/Hip Exercises: Stretches   Passive Hamstring Stretch  Right;Left;2 reps;60 seconds    Piriformis Stretch  Both;2 reps;60 seconds    Other Knee/Hip Stretches  foot on side of leg press leaning into stretch, then adding rotation component with holds. 2x30 sec seat piriformis each side, seated FWD lean stretch      Knee/Hip Exercises: Aerobic   Nustep  L5 x 5 min      Knee/Hip Exercises: Standing   Other Standing Knee Exercises  3x10 dead lift with 10# to /from 6" stepVC to llimit knee flexion               PT Short Term Goals - 12/22/18 1304      PT SHORT TERM GOAL #1   Title  independent with initial HEP    Status  Achieved        PT Long Term Goals - 01/01/19 0949      PT LONG TERM GOAL #1   Title  I with finalized HEP for lower body flexibility ( 01/11/2019)    Status  On-going  PT LONG TERM GOAL #2   Title  demo bilat hip flexion and IR WNL to allow him to donn/doff socks and shoes easily ( 01/11/2019)    Baseline  pt reports 50% improvement    Status  On-going      PT LONG TERM GOAL #3   Title  increase SLR bilat > 70 degrees ( 01/11/2019)    Baseline  Rt 74, Lt 62    Period  Weeks    Status  Partially Met      PT LONG TERM GOAL #4   Title  report ability to pick items up off the floor with minimal to no difficulty ( 01/11/2019)    Baseline  pt doesn't feel like he has improved here yet    Status  On-going            Plan - 01/01/19 1003    Clinical Impression Statement  Pt has not been consistent with his HEP, wife reports he is having increased memory issues and depression that are not helping. Even with inconsistency at home he has shown improvement and is moving towards his goals Would most likely benefit from continued PT - will need an assesment.    PT Frequency  2x / week    PT Duration  4 weeks    PT Treatment/Interventions  Patient/family education;Functional mobility training;Moist Heat;Ultrasound;Passive range  of motion;Therapeutic exercise;Electrical Stimulation;Cryotherapy;Manual techniques;Taping    PT Next Visit Plan  will need assesment and possible plan of care renewal    Consulted and Agree with Plan of Care  Patient       Patient will benefit from skilled therapeutic intervention in order to improve the following deficits and impairments:  Obesity, Impaired flexibility, Decreased range of motion  Visit Diagnosis: Stiffness of left hip, not elsewhere classified  Stiffness of right hip, not elsewhere classified     Problem List Patient Active Problem List   Diagnosis Date Noted  . Major depressive disorder   . Obstructive sleep apnea on CPAP   . Poor flexibility of tendon 12/01/2018  . Subungual hematoma of digit of hand 12/01/2018  . Choking 12/01/2018  . Subungual hematoma of right foot 12/01/2018  . Hypothyroidism 10/15/2017  . Type 2 diabetes mellitus without complication, without long-term current use of insulin ([place]) 02/26/2017  . Morbid obesity ([place]) 02/26/2017  . Essential hypertension 02/26/2017    Jeral Pinch PT  01/01/2019, 10:12 AM  [place] [address] [place], [place], [address] Phone: [phone]   Fax:  [phone]  Name: Raymond Christensen MRN: [id number] Date of Birth: 22-May-1940

## 2019-01-05 ENCOUNTER — Ambulatory Visit: Payer: Medicare (Managed Care) | Admitting: Physical Therapy

## 2019-01-05 ENCOUNTER — Ambulatory Visit (INDEPENDENT_AMBULATORY_CARE_PROVIDER_SITE_OTHER): Payer: Medicare (Managed Care) | Admitting: Psychology

## 2019-01-05 ENCOUNTER — Other Ambulatory Visit: Payer: Self-pay

## 2019-01-05 DIAGNOSIS — M25652 Stiffness of left hip, not elsewhere classified: Secondary | ICD-10-CM | POA: Diagnosis not present

## 2019-01-05 DIAGNOSIS — M25651 Stiffness of right hip, not elsewhere classified: Secondary | ICD-10-CM

## 2019-01-05 DIAGNOSIS — F3289 Other specified depressive episodes: Secondary | ICD-10-CM | POA: Diagnosis not present

## 2019-01-05 NOTE — Therapy (Signed)
[address] [place], [place], [address] Phone: [phone]   Fax:  [phone]  Physical Therapy Treatment  Patient Details  Name: Raymond Christensen MRN: [id number] Date of Birth: 03/31/1940 Referring Provider (PT): Dr Hart Carwin Rosita Kea   Encounter Date: 01/05/2019  PT End of Session - 01/05/19 1127    Visit Number  7    Number of Visits  8    Date for PT Re-Evaluation  01/11/19    PT Start Time  1055    PT Stop Time  1140    PT Time Calculation (min)  45 min       Past Medical History:  Diagnosis Date  . Arthritis   . CAD (coronary artery disease)   . Concussion Summer 2012  . Essential hypertension 02/26/2017  . Heart disease   . Hyperlipidemia   . Hypothyroidism 10/15/2017  . Major depressive disorder   . Obstructive sleep apnea on CPAP   . Poor flexibility of tendon 12/01/2018  . Subungual hematoma of digit of hand 12/01/2018  . Subungual hematoma of right foot 12/01/2018  . Thyroid disease   . Type 2 diabetes mellitus without complication, without long-term current use of insulin ([place]) 02/26/2017    Past Surgical History:  Procedure Laterality Date  . BYPASS GRAFT  2001  . CAROTID ARTERY ANGIOPLASTY    . TOTAL HIP ARTHROPLASTY  1994, 1995    There were no vitals filed for this visit.  Subjective Assessment - 01/05/19 1055    Subjective  swam yesterday and doing stretches more- "you know it helps" " I can put my socks on now"    Currently in Pain?  Yes    Pain Score  2     Pain Location  Back                       [facility] Adult PT Treatment/Exercise - 01/05/19 0001      Knee/Hip Exercises: Aerobic   Nustep  L 6 6 min      Knee/Hip Exercises: Machines for Strengthening   Cybex Leg Press  2 sets 15, 70#      Knee/Hip Exercises: Standing   Lateral Step Up  Both;10 reps;Hand Hold: 2;Step Height: 6"   opp leg abd   Lateral Step Up Limitations  cued to stay upright    Forward Step Up  Both;10 reps;Hand Hold: 2;Step Height: 6"   opp leg ext   Forward Step Up Limitations  cued to stay upright    Other Standing Knee Exercises  fitter flex,ext and abd for hip strength and ROM   cued to stay up tall and engage core   Other Standing Knee Exercises  3x10 dead lift with 10# to /from 6" stepVC to llimit knee flexion      Knee/Hip Exercises: Seated   Sit to Sand  20 reps;without UE support   wt ball 10x chest press, 10x OH     Manual Therapy   Manual Therapy  Passive ROM    Passive ROM  bilat hips - rotation, KTC andhamstings with contract relax               PT Short Term Goals - 12/22/18 1304      PT SHORT TERM GOAL #1   Title  independent with initial HEP    Status  Achieved        PT Long Term Goals - 01/05/19 1126  PT LONG TERM GOAL #1   Title  I with finalized HEP for lower body flexibility ( 01/11/2019)    Status  On-going      PT LONG TERM GOAL #2   Title  demo bilat hip flexion and IR WNL to allow him to donn/doff socks and shoes easily ( 01/11/2019)    Status  Achieved      PT LONG TERM GOAL #3   Title  increase SLR bilat > 70 degrees ( 01/11/2019)      PT LONG TERM GOAL #4   Title  report ability to pick items up off the floor with minimal to no difficulty ( 01/11/2019)    Status  On-going            Plan - 01/05/19 1127    Clinical Impression Statement  added hip strengtheing ex with increased ROM and cued to enage core an dstay upright. pt continues to be very tight with PROM- again reviewed improtance of daily stretching .progressing with goals    PT Treatment/Interventions  Patient/family education;Functional mobility training;Moist Heat;Ultrasound;Passive range of motion;Therapeutic exercise;Electrical Stimulation;Cryotherapy;Manual techniques;Taping    PT Next Visit Plan  will need assesment and possible plan of care renewal       Patient will benefit from skilled therapeutic intervention in order to  improve the following deficits and impairments:  Obesity, Impaired flexibility, Decreased range of motion  Visit Diagnosis: Stiffness of right hip, not elsewhere classified  Stiffness of left hip, not elsewhere classified     Problem List Patient Active Problem List   Diagnosis Date Noted  . Major depressive disorder   . Obstructive sleep apnea on CPAP   . Poor flexibility of tendon 12/01/2018  . Subungual hematoma of digit of hand 12/01/2018  . Choking 12/01/2018  . Subungual hematoma of right foot 12/01/2018  . Hypothyroidism 10/15/2017  . Type 2 diabetes mellitus without complication, without long-term current use of insulin ([place]) 02/26/2017  . Morbid obesity ([place]) 02/26/2017  . Essential hypertension 02/26/2017    Dequane Strahan,ANGIE PTA 01/05/2019, 11:38 AM  [address] [place], [place], [address] Phone: [phone]   Fax:  [phone]  Name: Raymond Christensen MRN: [id number] Date of Birth: July 27, 1940

## 2019-01-12 ENCOUNTER — Ambulatory Visit: Payer: Medicare (Managed Care) | Admitting: Physical Therapy

## 2019-01-12 ENCOUNTER — Other Ambulatory Visit: Payer: Self-pay

## 2019-01-12 ENCOUNTER — Encounter: Payer: Self-pay | Admitting: Family Medicine

## 2019-01-12 DIAGNOSIS — M25652 Stiffness of left hip, not elsewhere classified: Secondary | ICD-10-CM

## 2019-01-12 DIAGNOSIS — M25651 Stiffness of right hip, not elsewhere classified: Secondary | ICD-10-CM

## 2019-01-12 NOTE — Therapy (Signed)
[address] [place], [place], [address] Phone: [phone]   Fax:  [phone]  Physical Therapy Treatment  Patient Details  Name: Raymond Christensen MRN: [id number] Date of Birth: [birth year] Referring Provider (PT): Dr Hart Carwin Rosita Kea   Encounter Date: 01/12/2019  PT End of Session - 01/12/19 1047    Visit Number  8    Number of Visits  8    PT Start Time  5638    PT Stop Time  7564    PT Time Calculation (min)  46 min       Past Medical History:  Diagnosis Date  . Arthritis   . CAD (coronary artery disease)   . Concussion Summer 2012  . Essential hypertension 02/26/2017  . Heart disease   . Hyperlipidemia   . Hypothyroidism 10/15/2017  . Major depressive disorder   . Obstructive sleep apnea on CPAP   . Poor flexibility of tendon 12/01/2018  . Subungual hematoma of digit of hand 12/01/2018  . Subungual hematoma of right foot 12/01/2018  . Thyroid disease   . Type 2 diabetes mellitus without complication, without long-term current use of insulin ([place]) 02/26/2017    Past Surgical History:  Procedure Laterality Date  . BYPASS GRAFT  2001  . CAROTID ARTERY ANGIOPLASTY    . TOTAL HIP ARTHROPLASTY  1994, 1995    There were no vitals filed for this visit.  Subjective Assessment - 01/12/19 1014    Subjective  took me several days to recovery after last session- I think soreness in muscles and from stretching. No pain yet today as I have not done anything.                       [facility] Adult PT Treatment/Exercise - 01/12/19 0001      Knee/Hip Exercises: Aerobic   Nustep  L 6 6 min      Knee/Hip Exercises: Standing   Other Standing Knee Exercises  3x10 dead lift with 10# to /from 6" stepVC to llimit knee flexion      Knee/Hip Exercises: Seated   Sit to Sand  20 reps;without UE support      Knee/Hip Exercises: Supine   Quad Sets  --   blue ball 10x chest press, 10x OH press   Bridges  Strengthening;Both;15 reps   feet on ball plus obl and KTC   Single Leg Bridge  Strengthening;Both;10 reps    Straight Leg Raises  Both;2 sets;10 reps   red tband with abd   Other Supine Knee/Hip Exercises  hip circles      Manual Therapy   Manual Therapy  Passive ROM    Passive ROM  bilat hips - hip flex, quad,rotation, KTC and hamstings with contract relax             PT Education - 01/12/19 1045    Education Details  REVIEWED and STRESSED need ot stretch at home, reviewed HS long sitting and supine with belt stretch and leg hang for hip flex/quad    Person(s) Educated  Patient    Methods  Explanation;Demonstration    Comprehension  Verbalized understanding;Returned demonstration       PT Short Term Goals - 12/22/18 1304      PT SHORT TERM GOAL #1   Title  independent with initial HEP    Status  Achieved        PT Long Term Goals - 01/12/19 1048  PT LONG TERM GOAL #1   Title  I with finalized HEP for lower body flexibility ( 01/11/2019)    Baseline  Reviewed and STRESSED need to stretch    Status  Partially Met      PT LONG TERM GOAL #2   Title  demo bilat hip flexion and IR WNL to allow him to donn/doff socks and shoes easily ( 01/11/2019)    Status  Achieved      PT LONG TERM GOAL #3   Title  increase SLR bilat > 70 degrees ( 01/11/2019)    Baseline  Rt 74, Lt 62    Status  Partially Met      PT LONG TERM GOAL #4   Title  report ability to pick items up off the floor with minimal to no difficulty ( 01/11/2019)    Status  Partially Met            Plan - 01/12/19 1048    Clinical Impression Statement  pt is progressing with goals and seeing improvements with PT. Pt verb stretching at home at times- reviewed and stressed improtance of this as he is very tight. Pt needed cuing with ex and had difficulty and RT hip flexor cramping iwth supine leg ex.    PT Treatment/Interventions  Patient/family education;Functional mobility training;Moist  Heat;Ultrasound;Passive range of motion;Therapeutic exercise;Electrical Stimulation;Cryotherapy;Manual techniques;Taping    PT Next Visit Plan  Renewal Done- continue with active and passive hip work       Patient will benefit from skilled therapeutic intervention in order to improve the following deficits and impairments:  Obesity, Impaired flexibility, Decreased range of motion  Visit Diagnosis: Stiffness of left hip, not elsewhere classified  Stiffness of right hip, not elsewhere classified     Problem List Patient Active Problem List   Diagnosis Date Noted  . Major depressive disorder   . Obstructive sleep apnea on CPAP   . Poor flexibility of tendon 12/01/2018  . Subungual hematoma of digit of hand 12/01/2018  . Choking 12/01/2018  . Subungual hematoma of right foot 12/01/2018  . Hypothyroidism 10/15/2017  . Type 2 diabetes mellitus without complication, without long-term current use of insulin ([place]) 02/26/2017  . Morbid obesity ([place]) 02/26/2017  . Essential hypertension 02/26/2017    Raymond Christensen,ANGIE PTA 01/12/2019, 10:52 AM  [address] [place], [place], [address] Phone: [phone]   Fax:  [phone]  Name: Raymond Christensen MRN: [id number] Date of Birth: Jun 12, 1940

## 2019-01-13 MED ORDER — CHLORTHALIDONE 25 MG PO TABS: ORAL_TABLET | ORAL | 1 refills | Status: DC

## 2019-01-14 ENCOUNTER — Ambulatory Visit: Payer: 59 | Admitting: *Deleted

## 2019-01-18 ENCOUNTER — Ambulatory Visit (INDEPENDENT_AMBULATORY_CARE_PROVIDER_SITE_OTHER): Payer: Medicare (Managed Care) | Admitting: Psychology

## 2019-01-18 ENCOUNTER — Encounter: Payer: Self-pay | Admitting: Family Medicine

## 2019-01-18 ENCOUNTER — Ambulatory Visit (INDEPENDENT_AMBULATORY_CARE_PROVIDER_SITE_OTHER): Payer: Medicare (Managed Care) | Admitting: Family Medicine

## 2019-01-18 ENCOUNTER — Encounter: Payer: Self-pay | Admitting: Physical Therapy

## 2019-01-18 ENCOUNTER — Ambulatory Visit: Payer: Medicare (Managed Care) | Attending: Family Medicine | Admitting: Physical Therapy

## 2019-01-18 ENCOUNTER — Other Ambulatory Visit: Payer: Self-pay

## 2019-01-18 ENCOUNTER — Other Ambulatory Visit: Payer: Self-pay | Admitting: Family Medicine

## 2019-01-18 DIAGNOSIS — M25651 Stiffness of right hip, not elsewhere classified: Secondary | ICD-10-CM

## 2019-01-18 DIAGNOSIS — I10 Essential (primary) hypertension: Secondary | ICD-10-CM | POA: Diagnosis not present

## 2019-01-18 DIAGNOSIS — R454 Irritability and anger: Secondary | ICD-10-CM | POA: Diagnosis not present

## 2019-01-18 DIAGNOSIS — F411 Generalized anxiety disorder: Secondary | ICD-10-CM

## 2019-01-18 DIAGNOSIS — M25652 Stiffness of left hip, not elsewhere classified: Secondary | ICD-10-CM | POA: Insufficient documentation

## 2019-01-18 DIAGNOSIS — F330 Major depressive disorder, recurrent, mild: Secondary | ICD-10-CM

## 2019-01-18 MED ORDER — CHLORTHALIDONE 25 MG PO TABS: 25.0000 mg | ORAL_TABLET | Freq: Every day | ORAL | 2 refills | Status: DC

## 2019-01-18 MED ORDER — BUSPIRONE HCL 7.5 MG PO TABS: 7.5000 mg | ORAL_TABLET | Freq: Two times a day (BID) | ORAL | 1 refills | Status: DC

## 2019-01-18 NOTE — Therapy (Signed)
[place], [place], [address] Phone: [phone]   Fax:  [phone]  Physical Therapy Treatment  Patient Details  Name: Raymond Christensen MRN: [id number] Date of Birth: 07-07-1940 Referring Provider (PT): Dr Hart Carwin Rosita Kea   Encounter Date: 01/18/2019  PT End of Session - 01/18/19 1140    Visit Number  9    Date for PT Re-Evaluation  02/12/19    PT Start Time  1058    PT Stop Time  1143    PT Time Calculation (min)  45 min    Activity Tolerance  Patient tolerated treatment well    Behavior During Therapy  [facility] for tasks assessed/performed       Past Medical History:  Diagnosis Date  . Arthritis   . CAD (coronary artery disease)   . Concussion Summer 2012  . Essential hypertension 02/26/2017  . Heart disease   . Hyperlipidemia   . Hypothyroidism 10/15/2017  . Major depressive disorder   . Obstructive sleep apnea on CPAP   . Poor flexibility of tendon 12/01/2018  . Subungual hematoma of digit of hand 12/01/2018  . Subungual hematoma of right foot 12/01/2018  . Thyroid disease   . Type 2 diabetes mellitus without complication, without long-term current use of insulin ([place]) 02/26/2017    Past Surgical History:  Procedure Laterality Date  . BYPASS GRAFT  2001  . CAROTID ARTERY ANGIOPLASTY    . TOTAL HIP ARTHROPLASTY  1994, 1995    There were no vitals filed for this visit.  Subjective Assessment - 01/18/19 1059    Subjective  Fine    Pertinent History  now difficulty donning shoes/socks and picking things up off the floor., bilat THR    Currently in Pain?  Yes    Pain Score  3     Pain Location  Back                       [facility] Adult PT Treatment/Exercise - 01/18/19 0001      Knee/Hip Exercises: Aerobic   Elliptical  I7  R5 x 6 min       Knee/Hip Exercises: Standing   Other Standing Knee Exercises  Overhead ext with ball 2x10     Other Standing Knee Exercises   3x10 dead lift with 10# to /from 6" stepVC to llimit knee flexion      Knee/Hip Exercises: Seated   Long Arc Quad  Both;2 sets;15 reps    Sit to [employer]  2 sets;10 reps;without UE support   mat table     Knee/Hip Exercises: Supine   Bridges  Strengthening;Both;15 reps;2 sets    Straight Leg Raises  Both;2 sets;10 reps    Straight Leg Raises Limitations  3    Other Supine Knee/Hip Exercises  hip abd 2lb 2x10 each       Manual Therapy   Manual Therapy  Passive ROM    Passive ROM  bilat hips - hip flex, quad,rotation, KTC and hamstings with contract relax               PT Short Term Goals - 12/22/18 1304      PT SHORT TERM GOAL #1   Title  independent with initial HEP    Status  Achieved        PT Long Term Goals - 01/12/19 1048      PT LONG TERM GOAL #1   Title  I with finalized HEP for lower body flexibility ( 01/11/2019)    Baseline  Reviewed and STRESSED need to stretch    Status  Partially Met      PT LONG TERM GOAL #2   Title  demo bilat hip flexion and IR WNL to allow him to donn/doff socks and shoes easily ( 01/11/2019)    Status  Achieved      PT LONG TERM GOAL #3   Title  increase SLR bilat > 70 degrees ( 01/11/2019)    Baseline  Rt 74, Lt 62    Status  Partially Met      PT LONG TERM GOAL #4   Title  report ability to pick items up off the floor with minimal to no difficulty ( 01/11/2019)    Status  Partially Met            Plan - 01/18/19 1141    Clinical Impression Statement  When asked if he was stretching at home he reports "Im trying". Again expressed the importance of at home stretching. He is very tight in his hip bilat HS and hips. Some R hip flexor fatigue with SLR. Cues not to allow LE to turn out with sit to stands.    Personal Factors and Comorbidities  Age;Behavior Pattern;Comorbidity 3+    Examination-Activity Limitations  Dressing;Other    Stability/Clinical Decision Making  Stable/Uncomplicated    PT Frequency  2x / week    PT  Duration  4 weeks    PT Treatment/Interventions  Patient/family education;Functional mobility training;Moist Heat;Ultrasound;Passive range of motion;Therapeutic exercise;Electrical Stimulation;Cryotherapy;Manual techniques;Taping    PT Next Visit Plan  continue with active and passive hip work       Patient will benefit from skilled therapeutic intervention in order to improve the following deficits and impairments:  Obesity, Impaired flexibility, Decreased range of motion  Visit Diagnosis: Stiffness of left hip, not elsewhere classified  Stiffness of right hip, not elsewhere classified     Problem List Patient Active Problem List   Diagnosis Date Noted  . Irritability and anger 01/18/2019  . Major depressive disorder   . Obstructive sleep apnea on CPAP   . Poor flexibility of tendon 12/01/2018  . Subungual hematoma of digit of hand 12/01/2018  . Choking 12/01/2018  . Subungual hematoma of right foot 12/01/2018  . Hypothyroidism 10/15/2017  . Type 2 diabetes mellitus without complication, without long-term current use of insulin ([place]) 02/26/2017  . Morbid obesity ([place]) 02/26/2017  . Essential hypertension 02/26/2017    Scot Jun, PTA 01/18/2019, 11:43 AM  [place], [place], [address] Phone: [phone]   Fax:  [phone]  Name: Raymond Christensen MRN: [id number] Date of Birth: Jul 29, 1940

## 2019-01-18 NOTE — Progress Notes (Signed)
   Neuropsychology Feedback Session Raymond Christensen. [facility] Department of Neurology  Reason for Referral:   Raymond Christensen a 79 y.o. Caucasian male referred by Ellouise Newer, M.D.,to characterize hiscurrent cognitive functioning and assist with diagnostic clarity and treatment planning in the context of subjective cognitive decline.  Feedback:   Dr. Jamal Collin completed a comprehensive neuropsychological evaluation on 12/28/2018. Please refer to that encounter for the full report and recommendations. Briefly, results suggested neuropsychological functioning largely within normal limits. Relative weaknesses (i.e., below average normative range) were exhibited across visual encoding (i.e., learning) and on isolated tasks assessing working memory, mental spatial manipulation, and cognitive adaptability; the latter domain, along with visual encoding abilities, represented mild performance declines relative to his 2013 evaluation. Outside of these areas, performance was consistently in the average to above average normative ranges and stable relative to previous performance.   Dr. Jamal Collin was accompanied by his wife for the current virtual feedback session. Content of the current session focused on the results of his neuropsychological evaluation. Dr. Jamal Collin and his wife were given the opportunity to ask questions and their questions were answered. They were also encouraged to reach out should additional questions arise. A copy of his report was mailed at the conclusion of the visit.      A total of 20 minutes were spent with Dr. Jamal Collin and his wife during the current feedback session.

## 2019-01-18 NOTE — Progress Notes (Signed)
CC: F/u anger  Subjective: Patient is a 79 y.o. male here for anger. Due to COVID-19 pandemic, we are interacting via web portal for an electronic face-to-face visit. I verified patient's ID using 2 identifiers. Patient agreed to proceed with visit via this method. Patient is at home, I am at office. Patient, his wife Arbie Cookey and I are present for visit.   Pt w hx of irritability that is steadily getting worse w age, thought to be related to anxiety. He has failed Zoloft, Lexapro, Effexor and most recently Cymbalta. He is following w a therapist, unsure if it is helping. He has just finished weaning himself down from Cymbalta.  Pt w hx of HTN. Too hard to cut chlorthalidone in half, going to change to full tab now. Has not been checking his BP at home. No CP or SOB.   ROS: Heart: Denies chest pain  Lungs: Denies SOB   Past Medical History:  Diagnosis Date  . Arthritis   . CAD (coronary artery disease)   . Concussion Summer 2012  . Essential hypertension 02/26/2017  . Heart disease   . Hyperlipidemia   . Hypothyroidism 10/15/2017  . Major depressive disorder   . Obstructive sleep apnea on CPAP   . Poor flexibility of tendon 12/01/2018  . Subungual hematoma of digit of hand 12/01/2018  . Subungual hematoma of right foot 12/01/2018  . Thyroid disease   . Type 2 diabetes mellitus without complication, without long-term current use of insulin ([place]) 02/26/2017    Objective: No conversational dyspnea Age appropriate judgment and insight Nml affect and mood  Assessment and Plan: Irritability and anger - Plan: busPIRone (BUSPAR) 7.5 MG tablet  Essential hypertension - Plan: chlorthalidone (HYGROTON) 25 MG tablet  1-Will avoid SNRI and SSRI classes given his failure w it. Cont w counseling. Add BuSpar. Discussed psych, will hold off for now. Consider Nortriptyline vs low dose Abilify vs Remeron at next visit. 2- Counseled on diet and exercise. OK to go to full tab daily. Will ck BP's at  home. F/u in 1 mo.  The patient voiced understanding and agreement to the plan.  [place], DO 01/18/19  9:29 AM

## 2019-01-20 ENCOUNTER — Telehealth: Payer: Self-pay | Admitting: Family Medicine

## 2019-01-20 ENCOUNTER — Ambulatory Visit: Payer: Medicare (Managed Care) | Admitting: Physical Therapy

## 2019-01-20 ENCOUNTER — Other Ambulatory Visit: Payer: Self-pay

## 2019-01-20 ENCOUNTER — Encounter: Payer: 59 | Admitting: Psychology

## 2019-01-20 DIAGNOSIS — M25652 Stiffness of left hip, not elsewhere classified: Secondary | ICD-10-CM | POA: Diagnosis not present

## 2019-01-20 DIAGNOSIS — I10 Essential (primary) hypertension: Secondary | ICD-10-CM

## 2019-01-20 DIAGNOSIS — M25651 Stiffness of right hip, not elsewhere classified: Secondary | ICD-10-CM

## 2019-01-20 MED ORDER — CHLORTHALIDONE 25 MG PO TABS: 25.0000 mg | ORAL_TABLET | Freq: Every day | ORAL | 0 refills | Status: DC

## 2019-01-20 NOTE — Telephone Encounter (Signed)
Clarified last OV .

## 2019-01-20 NOTE — Telephone Encounter (Signed)
Pharmacy called and is needing clarification on the instructions for the pts chlorthalidone. Please advise.   Callback #1  [id number]  Ref # [id number]       [place] [place] [address] [place] [address]  Phone: [phone] Fax: [phone]  Not a 24 hour pharmacy; exact hours not known.

## 2019-01-20 NOTE — Therapy (Addendum)
Progress Note Reporting Period 12/14/2018 to 01/20/2019 See note below for Objective Data and Assessment of Progress/Goals.     Jeral Pinch, PT 01/20/19 7:07 PM     [place] [address] [place], [place], [address] Phone: [phone]   Fax:  [phone]  Physical Therapy Treatment  Patient Details  Name: Raymond Christensen MRN: [id number] Date of Birth: 1940-10-11 Referring Provider (PT): Dr Hart Carwin Rosita Kea   Encounter Date: 01/20/2019  PT End of Session - 01/20/19 1238    Visit Number  10    Date for PT Re-Evaluation  02/12/19    PT Start Time  1234    PT Stop Time  1317    PT Time Calculation (min)  43 min    Activity Tolerance  Patient tolerated treatment well    Behavior During Therapy  [facility] for tasks assessed/performed       Past Medical History:  Diagnosis Date  . Arthritis   . CAD (coronary artery disease)   . Concussion Summer 2012  . Essential hypertension 02/26/2017  . Heart disease   . Hyperlipidemia   . Hypothyroidism 10/15/2017  . Major depressive disorder   . Obstructive sleep apnea on CPAP   . Poor flexibility of tendon 12/01/2018  . Subungual hematoma of digit of hand 12/01/2018  . Subungual hematoma of right foot 12/01/2018  . Thyroid disease   . Type 2 diabetes mellitus without complication, without long-term current use of insulin ([place]) 02/26/2017    Past Surgical History:  Procedure Laterality Date  . BYPASS GRAFT  2001  . CAROTID ARTERY ANGIOPLASTY    . TOTAL HIP ARTHROPLASTY  1994, 1995    There were no vitals filed for this visit.  Subjective Assessment - 01/20/19 1240    Subjective  Pt was sore after last visit better today    Patient Stated Goals  try PT and learn what he can do to settle his symptoms down.    Currently in Pain?  No/denies                       [facility] Adult PT Treatment/Exercise - 01/20/19 0001      Knee/Hip Exercises: Stretches   Other Knee/Hip Stretches  IR stretch bilat hips - windshield motion with feet on leg press    Other Knee/Hip Stretches  dynamic hip motion in supine pull KTC to hip abducition then straight      Knee/Hip Exercises: Aerobic   Nustep  L 6 6 min      Knee/Hip Exercises: Machines for Strengthening   Cybex Leg Press  2x15, 80# ball BTWN knees and VC to keep feet straight      Knee/Hip Exercises: Supine   Other Supine Knee/Hip Exercises  20 reps each after manual work - long leg internal rotation    Other Supine Knee/Hip Exercises  10 reps leg lengtheners      Manual Therapy   Passive ROM  bilat hips and long leg traction, IR stretching                PT Short Term Goals - 12/22/18 1304      PT SHORT TERM GOAL #1   Title  independent with initial HEP    Status  Achieved        PT Long Term Goals - 01/12/19 1048      PT LONG TERM GOAL #1   Title  I with finalized  HEP for lower body flexibility ( 01/11/2019)    Baseline  Reviewed and STRESSED need to stretch    Status  Partially Met      PT LONG TERM GOAL #2   Title  demo bilat hip flexion and IR WNL to allow him to donn/doff socks and shoes easily ( 01/11/2019)    Status  Achieved      PT LONG TERM GOAL #3   Title  increase SLR bilat > 70 degrees ( 01/11/2019)    Baseline  Rt 74, Lt 62    Status  Partially Met      PT LONG TERM GOAL #4   Title  report ability to pick items up off the floor with minimal to no difficulty ( 01/11/2019)    Status  Partially Met            Plan - 01/20/19 1319    Clinical Impression Statement  Pt had some good releases of he hip muscles/jt with stretching after long leg distraction.  He was again encouraged to perform stretches at home and add 3-5 min of focused walking with his feet parallel.    PT Frequency  2x / week    PT Duration  4 weeks    PT Treatment/Interventions  Patient/family education;Functional mobility training;Moist Heat;Ultrasound;Passive range of  motion;Therapeutic exercise;Electrical Stimulation;Cryotherapy;Manual techniques;Taping    PT Next Visit Plan  continue with active and passive hip work    Consulted and Agree with Plan of Care  Patient       Patient will benefit from skilled therapeutic intervention in order to improve the following deficits and impairments:  Obesity, Impaired flexibility, Decreased range of motion  Visit Diagnosis: Stiffness of left hip, not elsewhere classified  Stiffness of right hip, not elsewhere classified     Problem List Patient Active Problem List   Diagnosis Date Noted  . Irritability and anger 01/18/2019  . Major depressive disorder   . Obstructive sleep apnea on CPAP   . Poor flexibility of tendon 12/01/2018  . Subungual hematoma of digit of hand 12/01/2018  . Choking 12/01/2018  . Subungual hematoma of right foot 12/01/2018  . Hypothyroidism 10/15/2017  . Type 2 diabetes mellitus without complication, without long-term current use of insulin ([place]) 02/26/2017  . Morbid obesity ([place]) 02/26/2017  . Essential hypertension 02/26/2017   Jeral Pinch PT  01/20/2019, 1:21 PM  [place] [address] [place], [place], [address] Phone: [phone]   Fax:  [phone]  Name: Raymond Christensen MRN: [id number] Date of Birth: 01/24/1940

## 2019-01-22 ENCOUNTER — Other Ambulatory Visit: Payer: Self-pay | Admitting: Family Medicine

## 2019-01-22 DIAGNOSIS — I10 Essential (primary) hypertension: Secondary | ICD-10-CM

## 2019-01-22 MED ORDER — CHLORTHALIDONE 25 MG PO TABS: 25.0000 mg | ORAL_TABLET | Freq: Every day | ORAL | 0 refills | Status: DC

## 2019-01-24 ENCOUNTER — Ambulatory Visit: Payer: Medicare Other | Attending: Internal Medicine

## 2019-01-24 ENCOUNTER — Other Ambulatory Visit: Payer: Self-pay

## 2019-01-24 DIAGNOSIS — Z23 Encounter for immunization: Secondary | ICD-10-CM | POA: Insufficient documentation

## 2019-01-24 NOTE — Progress Notes (Signed)
   Covid-19 Vaccination Clinic  Name:  Vi Kanan    MRN: [id number] DOB: 1940/03/01  01/24/2019  Mr. Lennartz was observed post Covid-19 immunization for 15 minutes without incidence. He was provided with Vaccine Information Sheet and instruction to access the V-Safe system.   Mr. Villena was instructed to call 911 with any severe reactions post vaccine: Marland Kitchen Difficulty breathing  . Swelling of your face and throat  . A fast heartbeat  . A bad rash all over your body  . Dizziness and weakness    Immunizations Administered    Name Date Dose VIS Date Route   Pfizer COVID-19 Vaccine 01/24/2019 12:30 PM 0.3 mL 12/25/2018 Intramuscular   Manufacturer: [place]   Lot: [id number]   [place]: [id number]

## 2019-01-26 ENCOUNTER — Ambulatory Visit: Payer: Medicare (Managed Care)

## 2019-01-27 ENCOUNTER — Ambulatory Visit: Payer: Medicare (Managed Care) | Admitting: Physical Therapy

## 2019-01-27 ENCOUNTER — Ambulatory Visit (INDEPENDENT_AMBULATORY_CARE_PROVIDER_SITE_OTHER): Payer: Medicare (Managed Care) | Admitting: Psychology

## 2019-01-27 ENCOUNTER — Encounter: Payer: Self-pay | Admitting: Physical Therapy

## 2019-01-27 ENCOUNTER — Other Ambulatory Visit: Payer: Self-pay

## 2019-01-27 DIAGNOSIS — F3289 Other specified depressive episodes: Secondary | ICD-10-CM | POA: Diagnosis not present

## 2019-01-27 DIAGNOSIS — M25652 Stiffness of left hip, not elsewhere classified: Secondary | ICD-10-CM | POA: Diagnosis not present

## 2019-01-27 DIAGNOSIS — M25651 Stiffness of right hip, not elsewhere classified: Secondary | ICD-10-CM

## 2019-01-27 NOTE — Therapy (Signed)
[place], [place], [address] Phone: [phone]   Fax:  [phone]  Physical Therapy Treatment  Patient Details  Name: Raymond Christensen MRN: [id number] Date of Birth: 10/20/1940 Referring Provider (PT): Dr Hart Carwin Rosita Kea   Encounter Date: 01/27/2019  PT End of Session - 01/27/19 1106    Visit Number  11    Date for PT Re-Evaluation  02/12/19    PT Start Time  4034    PT Stop Time  1100    PT Time Calculation (min)  45 min    Activity Tolerance  Patient tolerated treatment well    Behavior During Therapy  [facility] for tasks assessed/performed       Past Medical History:  Diagnosis Date  . Arthritis   . CAD (coronary artery disease)   . Concussion Summer 2012  . Essential hypertension 02/26/2017  . Heart disease   . Hyperlipidemia   . Hypothyroidism 10/15/2017  . Major depressive disorder   . Obstructive sleep apnea on CPAP   . Poor flexibility of tendon 12/01/2018  . Subungual hematoma of digit of hand 12/01/2018  . Subungual hematoma of right foot 12/01/2018  . Thyroid disease   . Type 2 diabetes mellitus without complication, without long-term current use of insulin ([place]) 02/26/2017    Past Surgical History:  Procedure Laterality Date  . BYPASS GRAFT  2001  . CAROTID ARTERY ANGIOPLASTY    . TOTAL HIP ARTHROPLASTY  1994, 1995    There were no vitals filed for this visit.  Subjective Assessment - 01/27/19 1013    Subjective  "I am feeling all right" "Got real stiff, wasn't able to do much in between"    Currently in Pain?  No/denies                       [facility] Adult PT Treatment/Exercise - 01/27/19 0001      Knee/Hip Exercises: Aerobic   Nustep  L 6 6 min      Knee/Hip Exercises: Machines for Strengthening   Cybex Leg Press  2x15, 80# ball BTWN knees and VC to keep feet straight    Other Machine  Rows and lats 25lb 2x10       Knee/Hip Exercises: Standing    Lateral Step Up  Both;1 set;10 reps;Hand Hold: 0;Step Height: 6"    Forward Step Up  Both;1 set;10 reps;Hand Hold: 0;Step Height: 6"    Other Standing Knee Exercises  3x10 dead lift with 12# to /from 8" stepVC to llimit knee flexion      Knee/Hip Exercises: Seated   Sit to [employer]  2 sets;10 reps;without UE support   from blue chair, OHP with yellow ball      Knee/Hip Exercises: Supine   Bridges  Strengthening;Both;15 reps;2 sets               PT Short Term Goals - 12/22/18 1304      PT SHORT TERM GOAL #1   Title  independent with initial HEP    Status  Achieved        PT Long Term Goals - 01/27/19 1106      PT LONG TERM GOAL #2   Title  demo bilat hip flexion and IR WNL to allow him to donn/doff socks and shoes easily ( 01/11/2019)    Status  Achieved      PT LONG TERM GOAL #3   Title  increase  SLR bilat > 70 degrees ( 01/11/2019)    Baseline  Rt 74, Lt 62    Status  Partially Met            Plan - 01/27/19 1107    Clinical Impression Statement  Pt again encouraged to stretch at home even on days he does not feel like it. Some instability with lateral step ups, wit a rigid posture.Cues to maintain good posture and technique with RDL to obtain good Hs stretch.    Personal Factors and Comorbidities  Age;Behavior Pattern;Comorbidity 3+    Examination-Activity Limitations  Dressing;Other    PT Frequency  2x / week    PT Duration  2 weeks    PT Treatment/Interventions  Patient/family education;Functional mobility training;Moist Heat;Ultrasound;Passive range of motion;Therapeutic exercise;Electrical Stimulation;Cryotherapy;Manual techniques;Taping    PT Next Visit Plan  continue with active and passive hip work       Patient will benefit from skilled therapeutic intervention in order to improve the following deficits and impairments:  Obesity, Impaired flexibility, Decreased range of motion  Visit Diagnosis: Stiffness of right hip, not elsewhere  classified  Stiffness of left hip, not elsewhere classified     Problem List Patient Active Problem List   Diagnosis Date Noted  . Irritability and anger 01/18/2019  . Major depressive disorder   . Obstructive sleep apnea on CPAP   . Poor flexibility of tendon 12/01/2018  . Subungual hematoma of digit of hand 12/01/2018  . Choking 12/01/2018  . Subungual hematoma of right foot 12/01/2018  . Hypothyroidism 10/15/2017  . Type 2 diabetes mellitus without complication, without long-term current use of insulin ([place]) 02/26/2017  . Morbid obesity ([place]) 02/26/2017  . Essential hypertension 02/26/2017    Scot Jun, PTA 01/27/2019, 11:13 AM  [place], [place], [address] Phone: [phone]   Fax:  [phone]  Name: Cornellius Kropp MRN: [id number] Date of Birth: Aug 10, 1940

## 2019-01-29 ENCOUNTER — Ambulatory Visit: Payer: Medicare (Managed Care) | Admitting: Physical Therapy

## 2019-01-29 ENCOUNTER — Other Ambulatory Visit: Payer: Self-pay

## 2019-01-29 ENCOUNTER — Encounter: Payer: Self-pay | Admitting: Physical Therapy

## 2019-01-29 DIAGNOSIS — M25651 Stiffness of right hip, not elsewhere classified: Secondary | ICD-10-CM

## 2019-01-29 DIAGNOSIS — M25652 Stiffness of left hip, not elsewhere classified: Secondary | ICD-10-CM

## 2019-01-29 NOTE — Therapy (Signed)
[place], [place], [address] Phone: [phone]   Fax:  [phone]  Physical Therapy Treatment  Patient Details  Name: Raymond Christensen MRN: [id number] Date of Birth: [birth year] Referring Provider (PT): Dr Hart Carwin Rosita Kea   Encounter Date: 01/29/2019  PT End of Session - 01/29/19 1059    Visit Number  12    Date for PT Re-Evaluation  02/12/19    PT Start Time  7915    PT Stop Time  1059    PT Time Calculation (min)  44 min    Activity Tolerance  Patient tolerated treatment well    Behavior During Therapy  [facility] for tasks assessed/performed       Past Medical History:  Diagnosis Date  . Arthritis   . CAD (coronary artery disease)   . Concussion Summer 2012  . Essential hypertension 02/26/2017  . Heart disease   . Hyperlipidemia   . Hypothyroidism 10/15/2017  . Major depressive disorder   . Obstructive sleep apnea on CPAP   . Poor flexibility of tendon 12/01/2018  . Subungual hematoma of digit of hand 12/01/2018  . Subungual hematoma of right foot 12/01/2018  . Thyroid disease   . Type 2 diabetes mellitus without complication, without long-term current use of insulin ([place]) 02/26/2017    Past Surgical History:  Procedure Laterality Date  . BYPASS GRAFT  2001  . CAROTID ARTERY ANGIOPLASTY    . TOTAL HIP ARTHROPLASTY  1994, 1995    There were no vitals filed for this visit.  Subjective Assessment - 01/29/19 1015    Subjective  "I am feeling ok"    Currently in Pain?  Yes    Pain Score  3     Pain Location  Leg                       [facility] Adult PT Treatment/Exercise - 01/29/19 0001      Knee/Hip Exercises: Stretches   Active Hamstring Stretch  Both;5 reps;10 seconds    Other Knee/Hip Stretches  K2C x3 10sec      Knee/Hip Exercises: Aerobic   Nustep  L 5 7 min      Knee/Hip Exercises: Machines for Strengthening   Cybex Knee Extension  20lb 2x10    Cybex Knee  Flexion  45lb 2x10    Cybex Leg Press  3x10, 80# ball BTWN knees and VC to keep feet straight    Other Machine  Rows and lats 35lb 3x10       Knee/Hip Exercises: Standing   Other Standing Knee Exercises  Overhead ext with ball 2x10     Other Standing Knee Exercises  3x10 dead lift with 12# to /from 4" stepVC to llimit knee flexion      Knee/Hip Exercises: Seated   Sit to Sand  2 sets;10 reps;without UE support   OHP blue ball               PT Short Term Goals - 12/22/18 1304      PT SHORT TERM GOAL #1   Title  independent with initial HEP    Status  Achieved        PT Long Term Goals - 01/27/19 1106      PT LONG TERM GOAL #2   Title  demo bilat hip flexion and IR WNL to allow him to donn/doff socks and shoes easily ( 01/11/2019)    Status  Achieved      PT LONG TERM GOAL #3   Title  increase SLR bilat > 70 degrees ( 01/11/2019)    Baseline  Rt 74, Lt 62    Status  Partially Met            Plan - 01/29/19 1100    Clinical Impression Statement  Pt remains very tighten in the hips and LE. He does well with the exercises. Cues needed to engage core with seated rows. Some ROM improvement with dead lifts but still needed cues to keep knees from bending. Advised pt to return to swimming.    Personal Factors and Comorbidities  Age;Behavior Pattern;Comorbidity 3+    Examination-Activity Limitations  Dressing;Other    PT Frequency  2x / week    PT Duration  2 weeks    PT Treatment/Interventions  Patient/family education;Functional mobility training;Moist Heat;Ultrasound;Passive range of motion;Therapeutic exercise;Electrical Stimulation;Cryotherapy;Manual techniques;Taping    PT Next Visit Plan  continue with active and passive hip work       Patient will benefit from skilled therapeutic intervention in order to improve the following deficits and impairments:  Obesity, Impaired flexibility, Decreased range of motion  Visit Diagnosis: Stiffness of right hip, not  elsewhere classified  Stiffness of left hip, not elsewhere classified     Problem List Patient Active Problem List   Diagnosis Date Noted  . Irritability and anger 01/18/2019  . Major depressive disorder   . Obstructive sleep apnea on CPAP   . Poor flexibility of tendon 12/01/2018  . Subungual hematoma of digit of hand 12/01/2018  . Choking 12/01/2018  . Subungual hematoma of right foot 12/01/2018  . Hypothyroidism 10/15/2017  . Type 2 diabetes mellitus without complication, without long-term current use of insulin ([place]) 02/26/2017  . Morbid obesity ([place]) 02/26/2017  . Essential hypertension 02/26/2017    Scot Jun, PTA 01/29/2019, 11:02 AM  [place], [place], [address] Phone: [phone]   Fax:  [phone]  Name: Andrae Claunch MRN: [id number] Date of Birth: 11-May-1940

## 2019-02-01 ENCOUNTER — Ambulatory Visit: Payer: Medicare (Managed Care) | Admitting: Physical Therapy

## 2019-02-01 ENCOUNTER — Other Ambulatory Visit: Payer: Self-pay

## 2019-02-01 DIAGNOSIS — M25652 Stiffness of left hip, not elsewhere classified: Secondary | ICD-10-CM

## 2019-02-01 DIAGNOSIS — M25651 Stiffness of right hip, not elsewhere classified: Secondary | ICD-10-CM

## 2019-02-01 NOTE — Therapy (Signed)
[place] [address] [place], [place], [address] Phone: [phone]   Fax:  [phone]  Physical Therapy Treatment  Patient Details  Name: Raymond Christensen MRN: [id number] Date of Birth: 05-06-1940 Referring Provider (PT): Dr Hart Carwin Rosita Kea   Encounter Date: 02/01/2019  PT End of Session - 02/01/19 1117    Visit Number  13    Date for PT Re-Evaluation  02/12/19    PT Start Time  8768    PT Stop Time  1104    PT Time Calculation (min)  49 min       Past Medical History:  Diagnosis Date  . Arthritis   . CAD (coronary artery disease)   . Concussion Summer 2012  . Essential hypertension 02/26/2017  . Heart disease   . Hyperlipidemia   . Hypothyroidism 10/15/2017  . Major depressive disorder   . Obstructive sleep apnea on CPAP   . Poor flexibility of tendon 12/01/2018  . Subungual hematoma of digit of hand 12/01/2018  . Subungual hematoma of right foot 12/01/2018  . Thyroid disease   . Type 2 diabetes mellitus without complication, without long-term current use of insulin ([place]) 02/26/2017    Past Surgical History:  Procedure Laterality Date  . BYPASS GRAFT  2001  . CAROTID ARTERY ANGIOPLASTY    . TOTAL HIP ARTHROPLASTY  1994, 1995    There were no vitals filed for this visit.  Subjective Assessment - 02/01/19 1016    Subjective  "okay" back okay- HS bothering me    Currently in Pain?  Yes    Pain Location  Leg    Pain Orientation  Posterior                       [facility] Adult PT Treatment/Exercise - 02/01/19 0001      Knee/Hip Exercises: Aerobic   Elliptical  2 fwd/2 back I 5 R 4    Nustep  L 5 6 min   LE only     Knee/Hip Exercises: Machines for Strengthening   Other Machine  Rows and lats 35lb 2 sets 15   black tband flex and ext 15     Knee/Hip Exercises: Standing   Lateral Step Up  Both;1 set;10 reps;Step Height: 6";Hand Hold: 1   opp leg abd   Forward Step Up  Both;1  set;10 reps;Step Height: 6";Hand Hold: 1   opp leg ext   Other Standing Knee Exercises  Overhead ext with ball 2x10    fitter BIL hip flex,ext and abd 15 each   Other Standing Knee Exercises  3x10 dead lift with 12# to /from 4" stepVC to llimit knee flexion   OH ball ext and rotation 15 x              PT Short Term Goals - 12/22/18 1304      PT SHORT TERM GOAL #1   Title  independent with initial HEP    Status  Achieved        PT Long Term Goals - 01/27/19 1106      PT LONG TERM GOAL #2   Title  demo bilat hip flexion and IR WNL to allow him to donn/doff socks and shoes easily ( 01/11/2019)    Status  Achieved      PT LONG TERM GOAL #3   Title  increase SLR bilat > 70 degrees ( 01/11/2019)    Baseline  Rt 74, Lt  23    Status  Partially Met            Plan - 02/01/19 1118    Clinical Impression Statement  increased ROM with ex todays with less cuing needed for BM and posture. cued to activate core. pt with no c/o back pain just HS pain/tightness    PT Treatment/Interventions  Patient/family education;Functional mobility training;Moist Heat;Ultrasound;Passive range of motion;Therapeutic exercise;Electrical Stimulation;Cryotherapy;Manual techniques;Taping    PT Next Visit Plan  continue with active and passive hip work       Patient will benefit from skilled therapeutic intervention in order to improve the following deficits and impairments:  Obesity, Impaired flexibility, Decreased range of motion  Visit Diagnosis: Stiffness of left hip, not elsewhere classified  Stiffness of right hip, not elsewhere classified     Problem List Patient Active Problem List   Diagnosis Date Noted  . Irritability and anger 01/18/2019  . Major depressive disorder   . Obstructive sleep apnea on CPAP   . Poor flexibility of tendon 12/01/2018  . Subungual hematoma of digit of hand 12/01/2018  . Choking 12/01/2018  . Subungual hematoma of right foot 12/01/2018  .  Hypothyroidism 10/15/2017  . Type 2 diabetes mellitus without complication, without long-term current use of insulin ([place]) 02/26/2017  . Morbid obesity ([place]) 02/26/2017  . Essential hypertension 02/26/2017    Samarie Pinder,ANGIE PTA 02/01/2019, 11:19 AM  [place] [address] [place] [address] [place], [place], [address] Phone: [phone]   Fax:  [phone]  Name: Raymond Christensen MRN: [id number] Date of Birth: [birth year]

## 2019-02-02 ENCOUNTER — Encounter: Payer: 59 | Admitting: Psychology

## 2019-02-03 ENCOUNTER — Other Ambulatory Visit: Payer: Self-pay

## 2019-02-03 ENCOUNTER — Ambulatory Visit: Payer: Medicare (Managed Care) | Admitting: Physical Therapy

## 2019-02-03 ENCOUNTER — Encounter: Payer: Self-pay | Admitting: Physical Therapy

## 2019-02-03 DIAGNOSIS — M25652 Stiffness of left hip, not elsewhere classified: Secondary | ICD-10-CM | POA: Diagnosis not present

## 2019-02-03 DIAGNOSIS — M25651 Stiffness of right hip, not elsewhere classified: Secondary | ICD-10-CM

## 2019-02-03 NOTE — Progress Notes (Signed)
Virtual Visit via Audio Note  I connected with patient on 02/04/19 at  8:45 AM EST by audio enabled telemedicine application and verified that I am speaking with the correct person using two identifiers.   THIS ENCOUNTER IS A VIRTUAL VISIT DUE TO COVID-19 - PATIENT WAS NOT SEEN IN THE OFFICE. PATIENT HAS CONSENTED TO VIRTUAL VISIT / TELEMEDICINE VISIT   Location of patient: home  Location of provider: office  I discussed the limitations of evaluation and management by telemedicine and the availability of in person appointments. The patient expressed understanding and agreed to proceed.   Subjective:   Josemaria Tolman is a 79 y.o. male who presents for Medicare Annual/Subsequent preventive examination.  Review of Systems:  Home Safety/Smoke Alarms: Feels safe in home. Smoke alarms in place.  Lives w/ wife in 1 story home. Walk in shower.   Male:   CCS- No longer doing routine screening due to age. PSA- No results found for: PSA    Objective:    Vitals: BP 137/78   There is no height or weight on file to calculate BMI.  Advanced Directives 02/04/2019 12/14/2018 01/12/2018 03/18/2017 01/04/2015 08/29/2014  Does Patient Have a Medical Advance Directive? Yes Yes Yes Yes No No  Type of Paramedic of [place];Living will [place];Living will [place];Living will - - -  Does patient want to make changes to medical advance directive? No - Patient declined - - No - Patient declined - -  Copy of [place] in Chart? Yes - validated most recent copy scanned in chart (See row information) No - copy requested Yes - validated most recent copy scanned in chart (See row information) - - -  Would patient like information on creating a medical advance directive? - - - - No - patient declined information -    Tobacco Social History   Tobacco Use  Smoking Status Former Smoker  . Types: Cigarettes  Smokeless Tobacco  Never Used     Counseling given: Not Answered   Clinical Intake: Pain : No/denies pain     Past Medical History:  Diagnosis Date  . Arthritis   . CAD (coronary artery disease)   . Concussion Summer 2012  . Essential hypertension 02/26/2017  . Heart disease   . Hyperlipidemia   . Hypothyroidism 10/15/2017  . Major depressive disorder   . Obstructive sleep apnea on CPAP   . Poor flexibility of tendon 12/01/2018  . Subungual hematoma of digit of hand 12/01/2018  . Subungual hematoma of right foot 12/01/2018  . Thyroid disease   . Type 2 diabetes mellitus without complication, without long-term current use of insulin ([place]) 02/26/2017   Past Surgical History:  Procedure Laterality Date  . BYPASS GRAFT  2001  . CAROTID ARTERY ANGIOPLASTY    . TOTAL HIP ARTHROPLASTY  1994, 1995   Family History  Problem Relation Age of Onset  . Dementia Father        Concerns surrounding Lewy body dementia, but never officially diagnosed  . Cancer Neg Hx    Social History   Socioeconomic History  . Marital status: Married    Spouse name: Not on file  . Number of children: Not on file  . Years of education: 36  . Highest education level: Doctorate  Occupational History  . Not on file  Tobacco Use  . Smoking status: Former Smoker    Types: Cigarettes  . Smokeless tobacco: Never Used  Substance  and Sexual Activity  . Alcohol use: Yes    Alcohol/week: 3.0 standard drinks    Types: 3 Standard drinks or equivalent per week    Comment: 1 drink 3 times per week  . Drug use: No  . Sexual activity: Yes  Other Topics Concern  . 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 Difficulty of Paying Living Expenses: Not on file  Food Insecurity:   . Worried About Ship broker in the Last Year: Not on file  . Ran Out of Food in the Last Year: Not on file  Transportation Needs:   . Lack of Transportation (Medical): Not on file  . Lack of Transportation (Non-Medical): Not on file  Physical Activity:   . Days of Exercise per Week: Not on file  . Minutes of Exercise per Session: Not on file  Stress:   . Feeling of Stress : Not on file  Social Connections:   . Frequency of Communication with Friends and Family: Not on file  . Frequency of Social Gatherings with Friends and Family: Not on file  . Attends Religious Services: Not on file  . Active Member of Clubs or Organizations: Not on file  . Attends Archivist Meetings: Not on file  . Marital Status: Not on file    Outpatient Encounter Medications as of 02/04/2019  Medication Sig  . Armodafinil 250 MG tablet Take by mouth.  Marland Kitchen aspirin EC 81 MG tablet Take by mouth.  . busPIRone (BUSPAR) 7.5 MG tablet Take 1 tablet (7.5 mg total) by mouth 2 (two) times daily.  . chlorthalidone (HYGROTON) 25 MG tablet Take 1 tablet (25 mg total) by mouth daily.  . Cholecalciferol (VITAMIN D3) 10000 units TABS Take by mouth.  . Cyanocobalamin (VITAMIN B-12 PO) Take by mouth.  . ezetimibe (ZETIA) 10 MG tablet TAKE 1 TABLET DAILY  . finasteride (PROSCAR) 5 MG tablet TAKE 1 TABLET DAILY  . levocetirizine (XYZAL) 5 MG tablet TAKE 1 TABLET EVERY EVENING  . levothyroxine (SYNTHROID) 75 MCG tablet Take 1 tablet (75 mcg total) by mouth daily.  Marland Kitchen liothyronine (CYTOMEL) 5 MCG tablet Take 2 tablets (10 mcg total) by mouth daily.  . metoprolol succinate (TOPROL-XL) 50 MG 24 hr tablet Take by mouth.  . Multiple Vitamins-Minerals (MULTIVITAMIN ADULT EXTRA C PO) Take by mouth.  . omega-3 acid ethyl esters (LOVAZA) 1 g capsule Take 2 capsules (2 g total) by mouth 2 (two) times daily.  . ramipril (ALTACE) 10 MG capsule Take 1 capsule (10 mg total) by mouth 2 (two) times daily.  . simvastatin (ZOCOR) 40 MG tablet Take 1 tablet  (40 mg total) by mouth daily at 6 PM.  . tamsulosin (FLOMAX) 0.4 MG CAPS capsule Take 1 capsule (0.4 mg total) by mouth daily.  . Zinc 50 MG TABS Take by mouth.  Glory Rosebush DELICA LANCETS [id number] MISC FSBS daily   No facility-administered encounter medications on file as of 02/04/2019.    Activities of Daily Living In your present state of health, do you have any difficulty performing the following activities: 02/04/2019  Hearing? N  Vision? N  Difficulty concentrating or making decisions? N  Walking or climbing stairs?  N  Dressing or bathing? N  Doing errands, shopping? N  Preparing Food and eating ? N  Using the Toilet? N  In the past six months, have you accidently leaked urine? N  Do you have problems with loss of bowel control? N  Managing your Medications? N  Managing your Finances? N  Housekeeping or managing your Housekeeping? N  Some recent data might be hidden    Patient Care Team: [name], DO as PCP - General (Family Medicine)   Assessment:   This is a routine wellness examination for [place]. Physical assessment deferred to PCP.  Exercise Activities and Dietary recommendations Current Exercise Habits: The patient does not participate in regular exercise at present, Exercise limited by: None identified Diet (meal preparation, eat out, water intake, caffeinated beverages, dairy products, fruits and vegetables): well balanced   Goals    . Weight (lb) < 225 lb (102.1 kg)     Cut back on after dinner snacks and continue to exercise.        Fall Risk Fall Risk  02/04/2019 11/02/2018 03/19/2018 01/12/2018 03/18/2017  Falls in the past year? 0 0 0 0 No  Number falls in past yr: 0 0 0 - -  Injury with Fall? 0 0 0 - -  Follow up Education provided;Falls prevention discussed Falls evaluation completed - - -    Depression Screen PHQ 2/9 Scores 01/12/2018 03/18/2017  PHQ - 2 Score 1 0    Cognitive Function Ad8 score reviewed for issues:  Issues making  decisions:no  Less interest in hobbies / activities:no  Repeats questions, stories (family complaining):no  Trouble using ordinary gadgets (microwave, computer, phone):no  Forgets the month or year: no  Mismanaging finances: no  Remembering appts:no  Daily problems with thinking and/or memory:no Ad8 score is=0     MMSE - Mini Mental State Exam 01/12/2018  Orientation to time 5  Orientation to Place 5  Registration 3  Attention/ Calculation 5  Recall 2  Language- name 2 objects 2  Language- repeat 1  Language- follow 3 step command 3  Language- read & follow direction 1  Write a sentence 1  Copy design 1  Total score 29   Montreal Cognitive Assessment  03/19/2018  Visuospatial/ Executive (0/5) 5  Naming (0/3) 3  Attention: Read list of digits (0/2) 2  Attention: Read list of letters (0/1) 0  Attention: Serial 7 subtraction starting at 100 (0/3) 2  Language: Repeat phrase (0/2) 2  Language : Fluency (0/1) 1  Abstraction (0/2) 2  Delayed Recall (0/5) 0  Orientation (0/6) 6  Total 23      Immunization History  Administered Date(s) Administered  . Influenza-Unspecified 10/26/2017, 09/15/2018  . PFIZER SARS-COV-2 Vaccination 01/24/2019  . Pneumococcal Polysaccharide-23 03/28/2009    Screening Tests Health Maintenance  Topic Date Due  . TETANUS/TDAP  10/01/1960  . OPHTHALMOLOGY EXAM  03/24/2019  . FOOT EXAM  05/25/2019  . HEMOGLOBIN A1C  05/31/2019  . INFLUENZA VACCINE  Completed  . PNA vac Low Risk Adult  Completed       Plan:    Please schedule your next medicare wellness visit with me in 1 yr.  Continue to eat heart healthy diet (full of fruits, vegetables, whole grains, lean protein, water--limit salt, fat, and sugar intake) and increase physical activity as tolerated.  Continue doing brain stimulating activities (puzzles, reading, adult coloring books, staying active) to keep memory sharp.     I have personally reviewed and noted  the following  in the patient's chart:   . Medical and social history . Use of alcohol, tobacco or illicit drugs  . Current medications and supplements . Functional ability and status . Nutritional status . Physical activity . Advanced directives . List of other physicians . Hospitalizations, surgeries, and ER visits in previous 12 months . Vitals . Screenings to include cognitive, depression, and falls . Referrals and appointments  In addition, I have reviewed and discussed with patient certain preventive protocols, quality metrics, and best practice recommendations. A written personalized care plan for preventive services as well as general preventive health recommendations were provided to patient.     Shela Nevin, [place]  02/04/2019

## 2019-02-03 NOTE — Therapy (Signed)
[place], [place], [address] Phone: [phone]   Fax:  [phone]  Physical Therapy Treatment  Patient Details  Name: Raymond Christensen MRN: [id number] Date of Birth: 18-Apr-1940 Referring Provider (PT): Dr Hart Carwin Rosita Kea   Encounter Date: 02/03/2019  PT End of Session - 02/03/19 1059    Visit Number  14    Date for PT Re-Evaluation  02/12/19    PT Start Time  1950    PT Stop Time  1059    PT Time Calculation (min)  44 min    Activity Tolerance  Patient tolerated treatment well    Behavior During Therapy  [facility] for tasks assessed/performed       Past Medical History:  Diagnosis Date  . Arthritis   . CAD (coronary artery disease)   . Concussion Summer 2012  . Essential hypertension 02/26/2017  . Heart disease   . Hyperlipidemia   . Hypothyroidism 10/15/2017  . Major depressive disorder   . Obstructive sleep apnea on CPAP   . Poor flexibility of tendon 12/01/2018  . Subungual hematoma of digit of hand 12/01/2018  . Subungual hematoma of right foot 12/01/2018  . Thyroid disease   . Type 2 diabetes mellitus without complication, without long-term current use of insulin ([place]) 02/26/2017    Past Surgical History:  Procedure Laterality Date  . BYPASS GRAFT  2001  . CAROTID ARTERY ANGIOPLASTY    . TOTAL HIP ARTHROPLASTY  1994, 1995    There were no vitals filed for this visit.  Subjective Assessment - 02/03/19 1016    Subjective  "Okay"  Muscles are sore    Currently in Pain?  No/denies   muscle soreness   Pain Score  2                        [facility] Adult PT Treatment/Exercise - 02/03/19 0001      Knee/Hip Exercises: Stretches   Active Hamstring Stretch  Both;10 seconds;20 seconds;4 reps    Gastroc Stretch  Both;4 reps;10 seconds;20 seconds    Other Knee/Hip Stretches  K2C x3 15sec      Knee/Hip Exercises: Aerobic   Elliptical  I7 R 5 x 3 min     Nustep  L 5 6 min     Other Aerobic  UBE L4 x 4 min       Knee/Hip Exercises: Machines for Strengthening   Other Machine  Rows and lats 35lb 2 sets 15   Black banf Ext 2x15      Knee/Hip Exercises: Standing   Other Standing Knee Exercises  Overhead ext with ball x15, Cross body Ext x 10 each      Other Standing Knee Exercises  2x15 dead lift with 14# to /from 4" stepVC to llimit knee flexion               PT Short Term Goals - 12/22/18 1304      PT SHORT TERM GOAL #1   Title  independent with initial HEP    Status  Achieved        PT Long Term Goals - 02/03/19 1100      PT LONG TERM GOAL #1   Title  I with finalized HEP for lower body flexibility ( 01/11/2019)    Status  Achieved      PT LONG TERM GOAL #2   Title  demo bilat hip flexion and IR WNL  to allow him to donn/doff socks and shoes easily ( 01/11/2019)    Status  Achieved      PT LONG TERM GOAL #3   Title  increase SLR bilat > 70 degrees ( 01/11/2019)    Status  Partially Met            Plan - 02/03/19 1101    Clinical Impression Statement  Pt remain very tight in the LE and hips. He does well with the exercises. Tactile cues needed to prevent knee flex with dead lifts. Very stiff with trunk rotations. Assist needed to prevent knee flex with pt HS strap stretch.    Personal Factors and Comorbidities  Age;Behavior Pattern;Comorbidity 3+    Examination-Activity Limitations  Dressing;Other    Stability/Clinical Decision Making  Stable/Uncomplicated    PT Frequency  2x / week    PT Duration  2 weeks    PT Treatment/Interventions  Patient/family education;Functional mobility training;Moist Heat;Ultrasound;Passive range of motion;Therapeutic exercise;Electrical Stimulation;Cryotherapy;Manual techniques;Taping    PT Next Visit Plan  continue with active and passive hip work       Patient will benefit from skilled therapeutic intervention in order to improve the following deficits and impairments:  Obesity, Impaired  flexibility, Decreased range of motion  Visit Diagnosis: Stiffness of left hip, not elsewhere classified  Stiffness of right hip, not elsewhere classified     Problem List Patient Active Problem List   Diagnosis Date Noted  . Irritability and anger 01/18/2019  . Major depressive disorder   . Obstructive sleep apnea on CPAP   . Poor flexibility of tendon 12/01/2018  . Subungual hematoma of digit of hand 12/01/2018  . Choking 12/01/2018  . Subungual hematoma of right foot 12/01/2018  . Hypothyroidism 10/15/2017  . Type 2 diabetes mellitus without complication, without long-term current use of insulin ([place]) 02/26/2017  . Morbid obesity ([place]) 02/26/2017  . Essential hypertension 02/26/2017    Scot Jun, PTA 02/03/2019, 11:07 AM  [place], [place], [address] Phone: [phone]   Fax:  [phone]  Name: Raymond Christensen MRN: [id number] Date of Birth: 09/21/1940

## 2019-02-04 ENCOUNTER — Other Ambulatory Visit: Payer: Self-pay | Admitting: Family Medicine

## 2019-02-04 ENCOUNTER — Encounter: Payer: Self-pay | Admitting: *Deleted

## 2019-02-04 ENCOUNTER — Ambulatory Visit (INDEPENDENT_AMBULATORY_CARE_PROVIDER_SITE_OTHER): Payer: Medicare (Managed Care) | Admitting: *Deleted

## 2019-02-04 VITALS — BP 137/78

## 2019-02-04 DIAGNOSIS — Z0000 Encounter for general adult medical examination without abnormal findings: Secondary | ICD-10-CM | POA: Diagnosis not present

## 2019-02-04 MED ORDER — MIRTAZAPINE 15 MG PO TBDP: 15.0000 mg | ORAL_TABLET | Freq: Every day | ORAL | 3 refills | Status: DC

## 2019-02-04 NOTE — Patient Instructions (Signed)
Please schedule your next medicare wellness visit with me in 1 yr.  Continue to eat heart healthy diet (full of fruits, vegetables, whole grains, lean protein, water--limit salt, fat, and sugar intake) and increase physical activity as tolerated.  Continue doing brain stimulating activities (puzzles, reading, adult coloring books, staying active) to keep memory sharp.    Raymond Christensen , Thank you for taking time to come for your Medicare Wellness Visit. I appreciate your ongoing commitment to your health goals. Please review the following plan we discussed and let me know if I can assist you in the future.   These are the goals we discussed: Goals    . Increase physical activity    . Weight (lb) < 225 lb (102.1 kg)     Cut back on after dinner snacks and continue to exercise.        This is a list of the screening recommended for you and due dates:  Health Maintenance  Topic Date Due  . Tetanus Vaccine  10/01/1960  . Eye exam for diabetics  03/24/2019  . Complete foot exam   05/25/2019  . Hemoglobin A1C  05/31/2019  . Flu Shot  Completed  . Pneumonia vaccines  Completed    Preventive Care 78 Years and Older, Male Preventive care refers to lifestyle choices and visits with your health care provider that can promote health and wellness. This includes:  A yearly physical exam. This is also called an annual well check.  Regular dental and eye exams.  Immunizations.  Screening for certain conditions.  Healthy lifestyle choices, such as diet and exercise. What can I expect for my preventive care visit? Physical exam Your health care provider will check:  Height and weight. These may be used to calculate body mass index (BMI), which is a measurement that tells if you are at a healthy weight.  Heart rate and blood pressure.  Your skin for abnormal spots. Counseling Your health care provider may ask you questions about:  Alcohol, tobacco, and drug use.  Emotional  well-being.  Home and relationship well-being.  Sexual activity.  Eating habits.  History of falls.  Memory and ability to understand (cognition).  Work and work Statistician. What immunizations do I need?  Influenza (flu) vaccine  This is recommended every year. Tetanus, diphtheria, and pertussis (Tdap) vaccine  You may need a Td booster every 10 years. Varicella (chickenpox) vaccine  You may need this vaccine if you have not already been vaccinated. Zoster (shingles) vaccine  You may need this after age 78. Pneumococcal conjugate (PCV13) vaccine  One dose is recommended after age 61. Pneumococcal polysaccharide (PPSV23) vaccine  One dose is recommended after age 15. Measles, mumps, and rubella (MMR) vaccine  You may need at least one dose of MMR if you were born in 1957 or later. You may also need a second dose. Meningococcal conjugate (MenACWY) vaccine  You may need this if you have certain conditions. Hepatitis A vaccine  You may need this if you have certain conditions or if you travel or work in places where you may be exposed to hepatitis A. Hepatitis B vaccine  You may need this if you have certain conditions or if you travel or work in places where you may be exposed to hepatitis B. Haemophilus influenzae type b (Hib) vaccine  You may need this if you have certain conditions. You may receive vaccines as individual doses or as more than one vaccine together in one shot (combination vaccines). Talk  with your health care provider about the risks and benefits of combination vaccines. What tests do I need? Blood tests  Lipid and cholesterol levels. These may be checked every 5 years, or more frequently depending on your overall health.  Hepatitis C test.  Hepatitis B test. Screening  Lung cancer screening. You may have this screening every year starting at age 52 if you have a 30-pack-year history of smoking and currently smoke or have quit within the  past 15 years.  Colorectal cancer screening. All adults should have this screening starting at age 64 and continuing until age 60. Your health care provider may recommend screening at age 53 if you are at increased risk. You will have tests every 1-10 years, depending on your results and the type of screening test.  Prostate cancer screening. Recommendations will vary depending on your family history and other risks.  Diabetes screening. This is done by checking your blood sugar (glucose) after you have not eaten for a while (fasting). You may have this done every 1-3 years.  Abdominal aortic aneurysm (AAA) screening. You may need this if you are a current or former smoker.  Sexually transmitted disease (STD) testing. Follow these instructions at home: Eating and drinking  Eat a diet that includes fresh fruits and vegetables, whole grains, lean protein, and low-fat dairy products. Limit your intake of foods with high amounts of sugar, saturated fats, and salt.  Take vitamin and mineral supplements as recommended by your health care provider.  Do not drink alcohol if your health care provider tells you not to drink.  If you drink alcohol: ? Limit how much you have to 0-2 drinks a day. ? Be aware of how much alcohol is in your drink. In the [place] one drink equals one 12 oz bottle of beer (355 mL), one 5 oz glass of wine (148 mL), or one 1 oz glass of hard liquor (44 mL). Lifestyle  Take daily care of your teeth and gums.  Stay active. Exercise for at least 30 minutes on 5 or more days each week.  Do not use any products that contain nicotine or tobacco, such as cigarettes, e-cigarettes, and chewing tobacco. If you need help quitting, ask your health care provider.  If you are sexually active, practice safe sex. Use a condom or other form of protection to prevent STIs (sexually transmitted infections).  Talk with your health care provider about taking a low-dose aspirin or  statin. What's next?  Visit your health care provider once a year for a well check visit.  Ask your health care provider how often you should have your eyes and teeth checked.  Stay up to date on all vaccines. This information is not intended to replace advice given to you by your health care provider. Make sure you discuss any questions you have with your health care provider. Document Revised: 12/25/2017 Document Reviewed: 12/25/2017 Elsevier Patient Education  2020 [employer].

## 2019-02-07 ENCOUNTER — Other Ambulatory Visit: Payer: Self-pay | Admitting: Family Medicine

## 2019-02-08 ENCOUNTER — Other Ambulatory Visit: Payer: Self-pay

## 2019-02-08 ENCOUNTER — Ambulatory Visit: Payer: Medicare (Managed Care) | Admitting: Physical Therapy

## 2019-02-08 ENCOUNTER — Encounter: Payer: Self-pay | Admitting: Physical Therapy

## 2019-02-08 DIAGNOSIS — M25652 Stiffness of left hip, not elsewhere classified: Secondary | ICD-10-CM

## 2019-02-08 DIAGNOSIS — M25651 Stiffness of right hip, not elsewhere classified: Secondary | ICD-10-CM

## 2019-02-08 NOTE — Therapy (Signed)
[place] [address] [place], [place], [address] Phone: [phone]   Fax:  [phone]  Physical Therapy Treatment  Patient Details  Name: Raymond Christensen MRN: [id number] Date of Birth: 04/29/1940 Referring Provider (PT): Dr Hart Carwin Rosita Kea   Encounter Date: 02/08/2019  PT End of Session - 02/08/19 1140    Visit Number  15    Date for PT Re-Evaluation  02/12/19    PT Start Time  1008    PT Stop Time  1055    PT Time Calculation (min)  47 min    Activity Tolerance  Patient tolerated treatment well    Behavior During Therapy  [facility] for tasks assessed/performed       Past Medical History:  Diagnosis Date  . Arthritis   . CAD (coronary artery disease)   . Concussion Summer 2012  . Essential hypertension 02/26/2017  . Heart disease   . Hyperlipidemia   . Hypothyroidism 10/15/2017  . Major depressive disorder   . Obstructive sleep apnea on CPAP   . Poor flexibility of tendon 12/01/2018  . Subungual hematoma of digit of hand 12/01/2018  . Subungual hematoma of right foot 12/01/2018  . Thyroid disease   . Type 2 diabetes mellitus without complication, without long-term current use of insulin ([place]) 02/26/2017    Past Surgical History:  Procedure Laterality Date  . BYPASS GRAFT  2001  . CAROTID ARTERY ANGIOPLASTY    . TOTAL HIP ARTHROPLASTY  1994, 1995    There were no vitals filed for this visit.  Subjective Assessment - 02/08/19 1012    Subjective  Juat tight    Currently in Pain?  No/denies                       [facility] Adult PT Treatment/Exercise - 02/08/19 0001      Knee/Hip Exercises: Stretches   Passive Hamstring Stretch  Both;5 reps;30 seconds    Hip Flexor Stretch  Both;4 reps;20 seconds      Knee/Hip Exercises: Aerobic   Elliptical  I7 R 5 x 4 min     Nustep  L 5 6 min      Knee/Hip Exercises: Standing   Walking with Sports Cord  all directions    Other Standing Knee  Exercises  SLS dead lifts to 4" step 6#, 20# unilateral farmer carry    Other Standing Knee Exercises  2x15 dead lift with 14# to /from 4" stepVC to llimit knee flexion               PT Short Term Goals - 12/22/18 1304      PT SHORT TERM GOAL #1   Title  independent with initial HEP    Status  Achieved        PT Long Term Goals - 02/03/19 1100      PT LONG TERM GOAL #1   Title  I with finalized HEP for lower body flexibility ( 01/11/2019)    Status  Achieved      PT LONG TERM GOAL #2   Title  demo bilat hip flexion and IR WNL to allow him to donn/doff socks and shoes easily ( 01/11/2019)    Status  Achieved      PT LONG TERM GOAL #3   Title  increase SLR bilat > 70 degrees ( 01/11/2019)    Status  Partially Met  Plan - 02/08/19 1140    Clinical Impression Statement  Patient is very tight inthe  HS bilateral, really struggles with the dead lifts especially the SL dead lift, needing close CGA to control and for LOB.  I focused on the HS today holding longer and doing more and added some calf with it, he tolerated well.    PT Next Visit Plan  patient reports having a lot of difficulty trying to do at home due to how tight he is    Consulted and Agree with Plan of Care  Patient       Patient will benefit from skilled therapeutic intervention in order to improve the following deficits and impairments:  Obesity, Impaired flexibility, Decreased range of motion  Visit Diagnosis: Stiffness of left hip, not elsewhere classified  Stiffness of right hip, not elsewhere classified     Problem List Patient Active Problem List   Diagnosis Date Noted  . Irritability and anger 01/18/2019  . Major depressive disorder   . Obstructive sleep apnea on CPAP   . Poor flexibility of tendon 12/01/2018  . Subungual hematoma of digit of hand 12/01/2018  . Choking 12/01/2018  . Subungual hematoma of right foot 12/01/2018  . Hypothyroidism 10/15/2017  . Type 2  diabetes mellitus without complication, without long-term current use of insulin ([place]) 02/26/2017  . Morbid obesity ([place]) 02/26/2017  . Essential hypertension 02/26/2017    Sumner Boast., PT 02/08/2019, 11:47 AM  [address] [place], [place], [address] Phone: [phone]   Fax:  [phone]  Name: Raymond Christensen MRN: [id number] Date of Birth: [birth year]

## 2019-02-10 ENCOUNTER — Ambulatory Visit (INDEPENDENT_AMBULATORY_CARE_PROVIDER_SITE_OTHER): Payer: Medicare (Managed Care) | Admitting: Psychology

## 2019-02-10 ENCOUNTER — Encounter: Payer: Self-pay | Admitting: Physical Therapy

## 2019-02-10 ENCOUNTER — Other Ambulatory Visit: Payer: Self-pay

## 2019-02-10 ENCOUNTER — Ambulatory Visit: Payer: Medicare (Managed Care) | Admitting: Physical Therapy

## 2019-02-10 DIAGNOSIS — M25652 Stiffness of left hip, not elsewhere classified: Secondary | ICD-10-CM | POA: Diagnosis not present

## 2019-02-10 DIAGNOSIS — F3289 Other specified depressive episodes: Secondary | ICD-10-CM | POA: Diagnosis not present

## 2019-02-10 DIAGNOSIS — M25651 Stiffness of right hip, not elsewhere classified: Secondary | ICD-10-CM

## 2019-02-10 NOTE — Therapy (Signed)
[place], [place], [address] Phone: [phone]   Fax:  [phone]  Physical Therapy Treatment  Patient Details  Name: Raymond Christensen MRN: [id number] Date of Birth: Oct 29, 1940 Referring Provider (PT): Dr Hart Carwin Rosita Kea   Encounter Date: 02/10/2019  PT End of Session - 02/10/19 1056    Visit Number  16    Date for PT Re-Evaluation  02/12/19    PT Start Time  2979    PT Stop Time  1056    PT Time Calculation (min)  41 min    Activity Tolerance  Patient tolerated treatment well    Behavior During Therapy  [facility] for tasks assessed/performed       Past Medical History:  Diagnosis Date  . Arthritis   . CAD (coronary artery disease)   . Concussion Summer 2012  . Essential hypertension 02/26/2017  . Heart disease   . Hyperlipidemia   . Hypothyroidism 10/15/2017  . Major depressive disorder   . Obstructive sleep apnea on CPAP   . Poor flexibility of tendon 12/01/2018  . Subungual hematoma of digit of hand 12/01/2018  . Subungual hematoma of right foot 12/01/2018  . Thyroid disease   . Type 2 diabetes mellitus without complication, without long-term current use of insulin ([place]) 02/26/2017    Past Surgical History:  Procedure Laterality Date  . BYPASS GRAFT  2001  . CAROTID ARTERY ANGIOPLASTY    . TOTAL HIP ARTHROPLASTY  1994, 1995    There were no vitals filed for this visit.  Subjective Assessment - 02/10/19 1019    Subjective  "today I feel fine"    Currently in Pain?  No/denies                       [facility] Adult PT Treatment/Exercise - 02/10/19 0001      Knee/Hip Exercises: Stretches   Passive Hamstring Stretch  Both;5 reps;30 seconds    Hip Flexor Stretch  Both;4 reps;20 seconds      Knee/Hip Exercises: Aerobic   Elliptical  I7 R 5 x 4 min     Nustep  L 5 6 min      Knee/Hip Exercises: Standing   Other Standing Knee Exercises  SLS dead lifts to 6" step 7#,  Overhead unilateral faormer carry 5lb    Other Standing Knee Exercises  2x15 dead lift with 14# to /from 6" stepVC to llimit knee flexion               PT Short Term Goals - 12/22/18 1304      PT SHORT TERM GOAL #1   Title  independent with initial HEP    Status  Achieved        PT Long Term Goals - 02/03/19 1100      PT LONG TERM GOAL #1   Title  I with finalized HEP for lower body flexibility ( 01/11/2019)    Status  Achieved      PT LONG TERM GOAL #2   Title  demo bilat hip flexion and IR WNL to allow him to donn/doff socks and shoes easily ( 01/11/2019)    Status  Achieved      PT LONG TERM GOAL #3   Title  increase SLR bilat > 70 degrees ( 01/11/2019)    Status  Partially Met            Plan - 02/10/19 1057  Clinical Impression Statement  Pt remain very tighten in his hamstrings. Very difficult with single leg dead lifts more so with the RLE. Reports pain in the back of the knee with passive hamstring stretching. Pt again encouraged to stretch at home.    Personal Factors and Comorbidities  Age;Behavior Pattern;Comorbidity 3+    Examination-Activity Limitations  Dressing;Other    Stability/Clinical Decision Making  Stable/Uncomplicated    PT Frequency  2x / week    PT Duration  2 weeks    PT Treatment/Interventions  Patient/family education;Functional mobility training;Moist Heat;Ultrasound;Passive range of motion;Therapeutic exercise;Electrical Stimulation;Cryotherapy;Manual techniques;Taping    PT Next Visit Plan  patient reports having a lot of difficulty trying to do at home due to how tight he is       Patient will benefit from skilled therapeutic intervention in order to improve the following deficits and impairments:  Obesity, Impaired flexibility, Decreased range of motion  Visit Diagnosis: Stiffness of right hip, not elsewhere classified  Stiffness of left hip, not elsewhere classified     Problem List Patient Active Problem List    Diagnosis Date Noted  . Irritability and anger 01/18/2019  . Major depressive disorder   . Obstructive sleep apnea on CPAP   . Poor flexibility of tendon 12/01/2018  . Subungual hematoma of digit of hand 12/01/2018  . Choking 12/01/2018  . Subungual hematoma of right foot 12/01/2018  . Hypothyroidism 10/15/2017  . Type 2 diabetes mellitus without complication, without long-term current use of insulin ([place]) 02/26/2017  . Morbid obesity ([place]) 02/26/2017  . Essential hypertension 02/26/2017    Scot Jun, PTA 02/10/2019, 10:59 AM  [place] [place], [place], [address] Phone: [phone]   Fax:  [phone]  Name: Raymond Christensen MRN: [id number] Date of Birth: [birth year]

## 2019-02-14 ENCOUNTER — Ambulatory Visit: Payer: Medicare (Managed Care)

## 2019-02-14 ENCOUNTER — Ambulatory Visit: Payer: Medicare (Managed Care) | Attending: Internal Medicine

## 2019-02-14 DIAGNOSIS — Z23 Encounter for immunization: Secondary | ICD-10-CM | POA: Insufficient documentation

## 2019-02-14 NOTE — Progress Notes (Signed)
   Covid-19 Vaccination Clinic  Name:  Raymond Christensen    MRN: [id number] DOB: Jul 31, 1940  02/14/2019  Raymond Christensen was observed post Covid-19 immunization for 15 minutes without incidence. He was provided with Vaccine Information Sheet and instruction to access the V-Safe system.   Raymond Christensen was instructed to call 911 with any severe reactions post vaccine: Marland Kitchen Difficulty breathing  . Swelling of your face and throat  . A fast heartbeat  . A bad rash all over your body  . Dizziness and weakness    Immunizations Administered    Name Date Dose VIS Date Route   Pfizer COVID-19 Vaccine 02/14/2019 11:21 AM 0.3 mL 12/25/2018 Intramuscular   Manufacturer: [employer]   Lot: [id number]   [place]: [id number]

## 2019-02-16 ENCOUNTER — Other Ambulatory Visit: Payer: Self-pay

## 2019-02-16 ENCOUNTER — Ambulatory Visit: Payer: Medicare (Managed Care) | Attending: Family Medicine | Admitting: Physical Therapy

## 2019-02-16 DIAGNOSIS — M25652 Stiffness of left hip, not elsewhere classified: Secondary | ICD-10-CM | POA: Diagnosis present

## 2019-02-16 DIAGNOSIS — M25651 Stiffness of right hip, not elsewhere classified: Secondary | ICD-10-CM

## 2019-02-16 NOTE — Therapy (Signed)
[place] [address] [place], [place], [address] Phone: [phone]   Fax:  [phone]  Physical Therapy Treatment  Patient Details  Name: Raymond Christensen MRN: [id number] Date of Birth: 11/02/1940 Referring Provider (PT): Dr Hart Carwin Rosita Kea   Encounter Date: 02/16/2019  PT End of Session - 02/16/19 1025    Visit Number  17    Date for PT Re-Evaluation  03/16/19    PT Start Time  2536    PT Stop Time  1055    PT Time Calculation (min)  40 min       Past Medical History:  Diagnosis Date  . Arthritis   . CAD (coronary artery disease)   . Concussion Summer 2012  . Essential hypertension 02/26/2017  . Heart disease   . Hyperlipidemia   . Hypothyroidism 10/15/2017  . Major depressive disorder   . Obstructive sleep apnea on CPAP   . Poor flexibility of tendon 12/01/2018  . Subungual hematoma of digit of hand 12/01/2018  . Subungual hematoma of right foot 12/01/2018  . Thyroid disease   . Type 2 diabetes mellitus without complication, without long-term current use of insulin ([place]) 02/26/2017    Past Surgical History:  Procedure Laterality Date  . BYPASS GRAFT  2001  . CAROTID ARTERY ANGIOPLASTY    . TOTAL HIP ARTHROPLASTY  1994, 1995    There were no vitals filed for this visit.  Subjective Assessment - 02/16/19 1015    Subjective  this past week I did nothing at home-barely moved. pt states revied 2nd covid vacine and very sore after prior sessions.    Currently in Pain?  Yes    Pain Score  4     Pain Location  Leg                       [facility] Adult PT Treatment/Exercise - 02/16/19 0001      Knee/Hip Exercises: Stretches   Active Hamstring Stretch  Both;3 reps;20 seconds      Knee/Hip Exercises: Machines for Strengthening   Cybex Knee Extension  20lb 2x10    Cybex Knee Flexion  45lb 2x10    Other Machine  Rows and lats 35lb 2 sets 15   black tband trunk flex/ext 15     Knee/Hip  Exercises: Standing   Other Standing Knee Exercises  2x15 dead lift with 10# to /from 6" stepVC to llimit knee flexion      Knee/Hip Exercises: Supine   Other Supine Knee/Hip Exercises  feet on ball bridge, KTC and obl               PT Short Term Goals - 12/22/18 1304      PT SHORT TERM GOAL #1   Title  independent with initial HEP    Status  Achieved        PT Long Term Goals - 02/16/19 1015      PT LONG TERM GOAL #3   Title  increase SLR bilat > 70 degrees ( 01/11/2019)    Baseline  RT 72   Left 58    Status  Partially Met      PT LONG TERM GOAL #4   Title  report ability to pick items up off the floor with minimal to no difficulty ( 01/11/2019)    Baseline  " i take a knee, but can get up"    Status  Partially Met  Plan - 02/16/19 1025    Clinical Impression Statement  limited goal progress and limited compliance with ex/stretches at home. talked to pt re: he will continue to be up and down if he only does ex/stetches while in clinic he verb understanding. pt las lost AROM and tight/spams and cramps with ex    PT Treatment/Interventions  Patient/family education;Functional mobility training;Moist Heat;Ultrasound;Passive range of motion;Therapeutic exercise;Electrical Stimulation;Cryotherapy;Manual techniques;Taping    PT Next Visit Plan  will renew for 1 month and then D/C with HEP or if non complaint       Patient will benefit from skilled therapeutic intervention in order to improve the following deficits and impairments:  Obesity, Impaired flexibility, Decreased range of motion  Visit Diagnosis: Stiffness of right hip, not elsewhere classified  Stiffness of left hip, not elsewhere classified     Problem List Patient Active Problem List   Diagnosis Date Noted  . Irritability and anger 01/18/2019  . Major depressive disorder   . Obstructive sleep apnea on CPAP   . Poor flexibility of tendon 12/01/2018  . Subungual hematoma of digit of  hand 12/01/2018  . Choking 12/01/2018  . Subungual hematoma of right foot 12/01/2018  . Hypothyroidism 10/15/2017  . Type 2 diabetes mellitus without complication, without long-term current use of insulin ([place]) 02/26/2017  . Morbid obesity ([place]) 02/26/2017  . Essential hypertension 02/26/2017    Teria Khachatryan,ANGIE PTA 02/16/2019, 10:40 AM  [address] [place], [place], [address] Phone: [phone]   Fax:  [phone]  Name: Raymond Christensen MRN: [id number] Date of Birth: 11-09-1940

## 2019-02-18 ENCOUNTER — Other Ambulatory Visit: Payer: Self-pay

## 2019-02-18 ENCOUNTER — Ambulatory Visit: Payer: Medicare (Managed Care) | Admitting: Physical Therapy

## 2019-02-18 DIAGNOSIS — M25651 Stiffness of right hip, not elsewhere classified: Secondary | ICD-10-CM

## 2019-02-18 DIAGNOSIS — M25652 Stiffness of left hip, not elsewhere classified: Secondary | ICD-10-CM

## 2019-02-18 NOTE — Therapy (Signed)
[address] [place], [place], [address] Phone: [phone]   Fax:  [phone]  Physical Therapy Treatment  Patient Details  Name: Raymond Christensen MRN: [id number] Date of Birth: [birth year] Referring Provider (PT): Dr Hart Carwin Rosita Kea   Encounter Date: 02/18/2019  PT End of Session - 02/18/19 1123    Visit Number  18    Date for PT Re-Evaluation  03/16/19    PT Start Time  1045    PT Stop Time  1135    PT Time Calculation (min)  50 min       Past Medical History:  Diagnosis Date  . Arthritis   . CAD (coronary artery disease)   . Concussion Summer 2012  . Essential hypertension 02/26/2017  . Heart disease   . Hyperlipidemia   . Hypothyroidism 10/15/2017  . Major depressive disorder   . Obstructive sleep apnea on CPAP   . Poor flexibility of tendon 12/01/2018  . Subungual hematoma of digit of hand 12/01/2018  . Subungual hematoma of right foot 12/01/2018  . Thyroid disease   . Type 2 diabetes mellitus without complication, without long-term current use of insulin ([place]) 02/26/2017    Past Surgical History:  Procedure Laterality Date  . BYPASS GRAFT  2001  . CAROTID ARTERY ANGIOPLASTY    . TOTAL HIP ARTHROPLASTY  1994, 1995    There were no vitals filed for this visit.  Subjective Assessment - 02/18/19 1051    Subjective  sore after last session, RT HS very painful    Currently in Pain?  Yes    Pain Score  7     Pain Location  Leg    Pain Orientation  Right;Posterior                       [facility] Adult PT Treatment/Exercise - 02/18/19 0001      Knee/Hip Exercises: Aerobic   Nustep  L 5 6 min      Knee/Hip Exercises: Machines for Strengthening   Cybex Knee Extension  20lb 2x10    Cybex Knee Flexion  35#  2 sets 10    Cybex Leg Press  30# 15x 50# 15x      Knee/Hip Exercises: Standing   Heel Raises  Both;2 sets;10 reps   black bar     Modalities   Modalities  Moist Heat       Moist Heat Therapy   Number Minutes Moist Heat  10 Minutes    Moist Heat Location  Other (comment)   BIL HS after MT/stretch     Manual Therapy   Manual Therapy  Soft tissue mobilization;Passive ROM;Myofascial release    Soft tissue mobilization  with and without vibration BIL HS    Myofascial Release  RT HS    Passive ROM  BIL HS with and without CR   during RT HS stretch there was a "pop" with pain              PT Short Term Goals - 12/22/18 1304      PT SHORT TERM GOAL #1   Title  independent with initial HEP    Status  Achieved        PT Long Term Goals - 02/16/19 1015      PT LONG TERM GOAL #3   Title  increase SLR bilat > 70 degrees ( 01/11/2019)    Baseline  RT 72   Left 58  Status  Partially Met      PT LONG TERM GOAL #4   Title  report ability to pick items up off the floor with minimal to no difficulty ( 01/11/2019)    Baseline  " i take a knee, but can get up"    Status  Partially Met            Plan - 02/18/19 1124    Clinical Impression Statement  pt c/o increased RT HS pain, STW and PROM, during PROM of RT HS there was a '"pop" and pain. applied MH and proceeded with ther ex with some decreased wt and minimized pull on HS. instructed to Heat and /or ice at home.    PT Treatment/Interventions  Patient/family education;Functional mobility training;Moist Heat;Ultrasound;Passive range of motion;Therapeutic exercise;Electrical Stimulation;Cryotherapy;Manual techniques;Taping    PT Next Visit Plan  assess how HS feels after last session with "pop" druing MT       Patient will benefit from skilled therapeutic intervention in order to improve the following deficits and impairments:  Obesity, Impaired flexibility, Decreased range of motion  Visit Diagnosis: Stiffness of left hip, not elsewhere classified  Stiffness of right hip, not elsewhere classified     Problem List Patient Active Problem List   Diagnosis Date Noted  .  Irritability and anger 01/18/2019  . Major depressive disorder   . Obstructive sleep apnea on CPAP   . Poor flexibility of tendon 12/01/2018  . Subungual hematoma of digit of hand 12/01/2018  . Choking 12/01/2018  . Subungual hematoma of right foot 12/01/2018  . Hypothyroidism 10/15/2017  . Type 2 diabetes mellitus without complication, without long-term current use of insulin ([place]) 02/26/2017  . Morbid obesity ([place]) 02/26/2017  . Essential hypertension 02/26/2017    Raquon Milledge,ANGIE PTA 02/18/2019, 11:27 AM  [address] [place], [place], [address] Phone: [phone]   Fax:  [phone]  Name: Brevyn Ring MRN: [id number] Date of Birth: 1940-11-24

## 2019-02-22 ENCOUNTER — Encounter: Payer: Self-pay | Admitting: Physical Therapy

## 2019-02-22 ENCOUNTER — Ambulatory Visit: Payer: Medicare (Managed Care) | Admitting: Physical Therapy

## 2019-02-22 ENCOUNTER — Other Ambulatory Visit: Payer: Self-pay

## 2019-02-22 DIAGNOSIS — M25652 Stiffness of left hip, not elsewhere classified: Secondary | ICD-10-CM

## 2019-02-22 DIAGNOSIS — M25651 Stiffness of right hip, not elsewhere classified: Secondary | ICD-10-CM

## 2019-02-22 NOTE — Therapy (Signed)
[address] [place], [place], [address] Phone: [phone]   Fax:  [phone]  Physical Therapy Treatment  Patient Details  Name: Raymond Christensen MRN: [id number] Date of Birth: May 02, 1940 Referring Provider (PT): Dr Hart Carwin Rosita Kea   Encounter Date: 02/22/2019  PT End of Session - 02/22/19 1730    Visit Number  19    Date for PT Re-Evaluation  03/16/19    PT Start Time  1615   heat   PT Stop Time  1730    PT Time Calculation (min)  75 min    Activity Tolerance  Patient tolerated treatment well    Behavior During Therapy  [facility] for tasks assessed/performed       Past Medical History:  Diagnosis Date  . Arthritis   . CAD (coronary artery disease)   . Concussion Summer 2012  . Essential hypertension 02/26/2017  . Heart disease   . Hyperlipidemia   . Hypothyroidism 10/15/2017  . Major depressive disorder   . Obstructive sleep apnea on CPAP   . Poor flexibility of tendon 12/01/2018  . Subungual hematoma of digit of hand 12/01/2018  . Subungual hematoma of right foot 12/01/2018  . Thyroid disease   . Type 2 diabetes mellitus without complication, without long-term current use of insulin ([place]) 02/26/2017    Past Surgical History:  Procedure Laterality Date  . BYPASS GRAFT  2001  . CAROTID ARTERY ANGIOPLASTY    . TOTAL HIP ARTHROPLASTY  1994, 1995    There were no vitals filed for this visit.  Subjective Assessment - 02/22/19 1618    Subjective  Patient reports that he has been very sore since the last PT session, at that session he felt a pop in the right HS.  denies bruising, but still hurting    Currently in Pain?  Yes    Pain Score  6     Pain Location  Leg    Pain Orientation  Right;Posterior;Upper    Pain Descriptors / Indicators  Aching;Sore;Tightness    Aggravating Factors   worse if I am not moving                       [facility] Adult PT Treatment/Exercise - 02/22/19 0001       Knee/Hip Exercises: Aerobic   Recumbent Bike  level 0 x 6 minutes    Nustep  L 5 47mn      Modalities   Modalities  Moist Heat;Electrical Stimulation;Ultrasound      Moist Heat Therapy   Number Minutes Moist Heat  25 Minutes    Moist Heat Location  Other (comment)   to right HS before exercise for 10 minutes and then after ex     EAcupuncturistStimulation Location  right posterior thigh    Electrical Stimulation Action  FC    Electrical Stimulation Parameters  supine with leg elevated    Electrical Stimulation Goals  Pain      Ultrasound   Ultrasound Location  right posterior thight    Ultrasound Parameters  100% 1.5 w/cm2    Ultrasound Goals  Pain      Manual Therapy   Manual Therapy  Soft tissue mobilization;Passive ROM    Soft tissue mobilization  gentle in the rgiht HS mm belly    Passive ROM  gentle HS stretch               PT  Short Term Goals - 12/22/18 1304      PT SHORT TERM GOAL #1   Title  independent with initial HEP    Status  Achieved        PT Long Term Goals - 02/16/19 1015      PT LONG TERM GOAL #3   Title  increase SLR bilat > 70 degrees ( 01/11/2019)    Baseline  RT 72   Left 58    Status  Partially Met      PT LONG TERM GOAL #4   Title  report ability to pick items up off the floor with minimal to no difficulty ( 01/11/2019)    Baseline  " i take a knee, but can get up"    Status  Partially Met            Plan - 02/22/19 1730    Clinical Impression Statement  Patient had the incident of the HS popping last week when stretching here with PTA.  There was no bruising.  He was tender in the muscle belly of the right HS.  I did more STM and tried [place] and estim to see if we can help this    PT Next Visit Plan  continue to assess the HS see if modalities helped    Consulted and Agree with Plan of Care  Patient       Patient will benefit from skilled therapeutic intervention in order to improve the  following deficits and impairments:  Obesity, Impaired flexibility, Decreased range of motion  Visit Diagnosis: Stiffness of left hip, not elsewhere classified  Stiffness of right hip, not elsewhere classified     Problem List Patient Active Problem List   Diagnosis Date Noted  . Irritability and anger 01/18/2019  . Major depressive disorder   . Obstructive sleep apnea on CPAP   . Poor flexibility of tendon 12/01/2018  . Subungual hematoma of digit of hand 12/01/2018  . Choking 12/01/2018  . Subungual hematoma of right foot 12/01/2018  . Hypothyroidism 10/15/2017  . Type 2 diabetes mellitus without complication, without long-term current use of insulin ([place]) 02/26/2017  . Morbid obesity ([place]) 02/26/2017  . Essential hypertension 02/26/2017    Sumner Boast., PT 02/22/2019, 5:40 PM  [place] [place], [place], [address] Phone: [phone]   Fax:  [phone]  Name: Raymond Christensen MRN: [id number] Date of Birth: 10-07-1940

## 2019-02-24 ENCOUNTER — Ambulatory Visit (INDEPENDENT_AMBULATORY_CARE_PROVIDER_SITE_OTHER): Payer: Medicare (Managed Care) | Admitting: Psychology

## 2019-02-24 DIAGNOSIS — F3289 Other specified depressive episodes: Secondary | ICD-10-CM | POA: Diagnosis not present

## 2019-03-01 ENCOUNTER — Encounter: Payer: Self-pay | Admitting: Physical Therapy

## 2019-03-01 ENCOUNTER — Other Ambulatory Visit: Payer: Self-pay

## 2019-03-01 ENCOUNTER — Ambulatory Visit: Payer: Medicare (Managed Care) | Admitting: Physical Therapy

## 2019-03-01 DIAGNOSIS — M25651 Stiffness of right hip, not elsewhere classified: Secondary | ICD-10-CM | POA: Diagnosis not present

## 2019-03-01 DIAGNOSIS — M25652 Stiffness of left hip, not elsewhere classified: Secondary | ICD-10-CM

## 2019-03-01 NOTE — Therapy (Signed)
[facility] Christensen [address] [place], [place], [address] Phone: [phone]   Fax:  [phone]  Physical Therapy Treatment  Patient Details  Name: Raymond Christensen MRN: [id number] Date of Birth: [birth year] Referring Provider (PT): Dr Hart Carwin Rosita Kea   Encounter Date: 03/01/2019  PT End of Session - 03/01/19 1056    Visit Number  20    Date for PT Re-Evaluation  03/16/19    PT Start Time  1443    PT Stop Time  1109    PT Time Calculation (min)  54 min    Activity Tolerance  Patient tolerated treatment well    Behavior During Therapy  [facility] for tasks assessed/performed       Past Medical History:  Diagnosis Date  . Arthritis   . CAD (coronary artery disease)   . Concussion Summer 2012  . Essential hypertension 02/26/2017  . Heart disease   . Hyperlipidemia   . Hypothyroidism 10/15/2017  . Major depressive disorder   . Obstructive sleep apnea on CPAP   . Poor flexibility of tendon 12/01/2018  . Subungual hematoma of digit of hand 12/01/2018  . Subungual hematoma of right foot 12/01/2018  . Thyroid disease   . Type 2 diabetes mellitus without complication, without long-term current use of insulin ([place]) 02/26/2017    Past Surgical History:  Procedure Laterality Date  . BYPASS GRAFT  2001  . CAROTID ARTERY ANGIOPLASTY    . TOTAL HIP ARTHROPLASTY  1994, 1995    There were no vitals filed for this visit.  Subjective Assessment - 03/01/19 1016    Subjective  "Better than I did last week" A little pain in the R HS    Currently in Pain?  Yes    Pain Score  5     Pain Location  Leg    Pain Orientation  Right                       [facility] Adult PT Treatment/Exercise - 03/01/19 0001      Knee/Hip Exercises: Aerobic   Recumbent Bike  level 0 x 6 minutes    Nustep  L 5 72mn      Knee/Hip Exercises: Machines for Strengthening   Cybex Knee Flexion  20lb 2x10, RLE 2x10 10lb       Moist Heat Therapy   Number Minutes Moist Heat  15 Minutes    Moist Heat Location  Other (comment)   Hamstring     Ultrasound   Ultrasound Location  R posterior thigh    Ultrasound Parameters  100% 1Mhz 1.4w/cm2      Manual Therapy   Manual Therapy  Soft tissue mobilization;Passive ROM    Soft tissue mobilization  gentle in the rgiht HS mm belly    Passive ROM  gentle HS stretch               PT Short Term Goals - 12/22/18 1304      PT SHORT TERM GOAL #1   Title  independent with initial HEP    Status  Achieved        PT Long Term Goals - 02/16/19 1015      PT LONG TERM GOAL #3   Title  increase SLR bilat > 70 degrees ( 01/11/2019)    Baseline  RT 72   Left 58    Status  Partially Met      PT LONG TERM GOAL #4  Title  report ability to pick items up off the floor with minimal to no difficulty ( 01/11/2019)    Baseline  " i take a knee, but can get up"    Status  Partially Met            Plan - 03/01/19 1058    Clinical Impression Statement  Pt does have some ecchymosis present behind his R knee that was not there last treatment session. He report that he has been doing fine, but the pt's wife reports that he has been saying "Ouch" this past week ane she has been having to donn his socks. Today he has some tenderness in the R HS muscle belly. Did light exercises, stretches, and modalities.    Personal Factors and Comorbidities  Age;Behavior Pattern;Comorbidity 3+    Examination-Activity Limitations  Dressing;Other    Stability/Clinical Decision Making  Stable/Uncomplicated    PT Frequency  2x / week    PT Duration  2 weeks    PT Treatment/Interventions  Patient/family education;Functional mobility training;Moist Heat;Ultrasound;Passive range of motion;Therapeutic exercise;Electrical Stimulation;Cryotherapy;Manual techniques;Taping    PT Next Visit Plan  continue to assess the HS see if modalities helped       Patient will benefit from skilled therapeutic intervention in  order to improve the following deficits and impairments:  Obesity, Impaired flexibility, Decreased range of motion  Visit Diagnosis: Stiffness of left hip, not elsewhere classified  Stiffness of right hip, not elsewhere classified     Problem List Patient Active Problem List   Diagnosis Date Noted  . Irritability and anger 01/18/2019  . Major depressive disorder   . Obstructive sleep apnea on CPAP   . Poor flexibility of tendon 12/01/2018  . Subungual hematoma of digit of hand 12/01/2018  . Choking 12/01/2018  . Subungual hematoma of right foot 12/01/2018  . Hypothyroidism 10/15/2017  . Type 2 diabetes mellitus without complication, without long-term current use of insulin ([place]) 02/26/2017  . Morbid obesity ([place]) 02/26/2017  . Essential hypertension 02/26/2017    Scot Jun, PTA 03/01/2019, 11:01 AM  [address] [place], [place], [address] Phone: [phone]   Fax:  [phone]  Name: Raymond Christensen MRN: [id number] Date of Birth: 1940/09/07

## 2019-03-03 ENCOUNTER — Ambulatory Visit: Payer: Medicare (Managed Care)

## 2019-03-03 ENCOUNTER — Other Ambulatory Visit: Payer: Self-pay

## 2019-03-03 DIAGNOSIS — M25651 Stiffness of right hip, not elsewhere classified: Secondary | ICD-10-CM

## 2019-03-03 DIAGNOSIS — M25652 Stiffness of left hip, not elsewhere classified: Secondary | ICD-10-CM

## 2019-03-03 NOTE — Therapy (Signed)
[address] [place], [place], [address] Phone: [phone]   Fax:  [phone]  Physical Therapy Treatment  Patient Details  Name: Raymond Christensen MRN: [id number] Date of Birth: 31-Oct-1940 Referring Provider (PT): Dr Hart Carwin Rosita Kea   Encounter Date: 03/03/2019  PT End of Session - 03/03/19 0954    Visit Number  21    Date for PT Re-Evaluation  03/16/19    PT Start Time  0932    PT Stop Time  1024   Ended visit with 10 min moist heat   PT Time Calculation (min)  52 min    Activity Tolerance  Patient tolerated treatment well    Behavior During Therapy  [facility] for tasks assessed/performed       Past Medical History:  Diagnosis Date  . Arthritis   . CAD (coronary artery disease)   . Concussion Summer 2012  . Essential hypertension 02/26/2017  . Heart disease   . Hyperlipidemia   . Hypothyroidism 10/15/2017  . Major depressive disorder   . Obstructive sleep apnea on CPAP   . Poor flexibility of tendon 12/01/2018  . Subungual hematoma of digit of hand 12/01/2018  . Subungual hematoma of right foot 12/01/2018  . Thyroid disease   . Type 2 diabetes mellitus without complication, without long-term current use of insulin ([place]) 02/26/2017    Past Surgical History:  Procedure Laterality Date  . BYPASS GRAFT  2001  . CAROTID ARTERY ANGIOPLASTY    . TOTAL HIP ARTHROPLASTY  1994, 1995    There were no vitals filed for this visit.  Subjective Assessment - 03/03/19 1000    Subjective  Pt. noting he is still noticing some redness in R HS.    Pertinent History  now difficulty donning shoes/socks and picking things up off the floor., bilat THR    Patient Stated Goals  try PT and learn what he can do to settle his symptoms down.    Currently in Pain?  No/denies    Pain Score  0-No pain    Pain Location  Leg    Pain Orientation  Right    Pain Descriptors / Indicators  Aching;Sore    Multiple Pain Sites  No                        [facility] Adult PT Treatment/Exercise - 03/03/19 0001      Knee/Hip Exercises: Stretches   Passive Hamstring Stretch  Right;Left;2 reps;30 seconds    Passive Hamstring Stretch Limitations  B seated - instructed pt. to perform gentle on R HS seated with heel prop on 6" step    Piriformis Stretch  Right;Left;2 reps;30 seconds    Piriformis Stretch Limitations  modifed sitting on edge of mat table to trunk turned sideways with LE resting on table (see pt. education)    Press photographer  Right;Left;3 reps;30 seconds    Gastroc Stretch Limitations  leaning into wall       Knee/Hip Exercises: Aerobic   Nustep  L 5 4mn      Moist Heat Therapy   Number Minutes Moist Heat  10 Minutes    Moist Heat Location  --   R HS in hooklying with bolster      Manual Therapy   Manual Therapy  Passive ROM    Manual therapy comments  supine     Soft tissue mobilization  STM to R HS belly - mild tenderness  noted     Passive ROM  Manual B HS, glute stretch 4 x 30 sec each                PT Short Term Goals - 12/22/18 1304      PT SHORT TERM GOAL #1   Title  independent with initial HEP    Status  Achieved        PT Long Term Goals - 02/16/19 1015      PT LONG TERM GOAL #3   Title  increase SLR bilat > 70 degrees ( 01/11/2019)    Baseline  RT 72   Left 58    Status  Partially Met      PT LONG TERM GOAL #4   Title  report ability to pick items up off the floor with minimal to no difficulty ( 01/11/2019)    Baseline  " i take a knee, but can get up"    Status  Partially Met            Plan - 03/03/19 0954    Clinical Impression Statement  Pt. reporting improved R HS comfort last few days.  Unable to identify pain limiting him with any task at home.  Feels leg "tightness" limits him donning socks and has been doing this in standing.  Instructed pt. in glute stretches to improve positioning/comfort with putting on socks sitting (see pt. edu  section).  MT addressed mild tenderness in R HS.  Pt. noting some mild bruising remains in R HS.  Ended session with moist heat to R HS in hooklying.  Pt. leaving session pain free.    PT Treatment/Interventions  Patient/family education;Functional mobility training;Moist Heat;Ultrasound;Passive range of motion;Therapeutic exercise;Electrical Stimulation;Cryotherapy;Manual techniques;Taping    PT Next Visit Plan  continue to assess the HS see if modalities helped    Consulted and Agree with Plan of Care  Patient       Patient will benefit from skilled therapeutic intervention in order to improve the following deficits and impairments:  Obesity, Impaired flexibility, Decreased range of motion  Visit Diagnosis: Stiffness of left hip, not elsewhere classified  Stiffness of right hip, not elsewhere classified     Problem List Patient Active Problem List   Diagnosis Date Noted  . Irritability and anger 01/18/2019  . Major depressive disorder   . Obstructive sleep apnea on CPAP   . Poor flexibility of tendon 12/01/2018  . Subungual hematoma of digit of hand 12/01/2018  . Choking 12/01/2018  . Subungual hematoma of right foot 12/01/2018  . Hypothyroidism 10/15/2017  . Type 2 diabetes mellitus without complication, without long-term current use of insulin ([place]) 02/26/2017  . Morbid obesity ([place]) 02/26/2017  . Essential hypertension 02/26/2017    Bess Harvest, PTA 03/03/19 11:43 AM   [address] [place], [place], [address] Phone: [phone]   Fax:  [phone]  Name: Raymond Christensen MRN: [id number] Date of Birth: [birth year]

## 2019-03-08 ENCOUNTER — Ambulatory Visit: Payer: Medicare (Managed Care) | Admitting: Physical Therapy

## 2019-03-08 ENCOUNTER — Other Ambulatory Visit: Payer: Self-pay

## 2019-03-08 DIAGNOSIS — M25652 Stiffness of left hip, not elsewhere classified: Secondary | ICD-10-CM

## 2019-03-08 DIAGNOSIS — M25651 Stiffness of right hip, not elsewhere classified: Secondary | ICD-10-CM

## 2019-03-08 NOTE — Therapy (Signed)
[address] [address] [place], [place], [address] Phone: [phone]   Fax:  [phone]  Physical Therapy Treatment  Patient Details  Name: Raymond Christensen MRN: [id number] Date of Birth: [birth year] Referring Provider (PT): Dr Hart Carwin Rosita Kea   Encounter Date: 03/08/2019  PT End of Session - 03/08/19 1126    Visit Number  22    Date for PT Re-Evaluation  03/16/19    PT Start Time  1100    PT Stop Time  1150    PT Time Calculation (min)  50 min       Past Medical History:  Diagnosis Date  . Arthritis   . CAD (coronary artery disease)   . Concussion Summer 2012  . Essential hypertension 02/26/2017  . Heart disease   . Hyperlipidemia   . Hypothyroidism 10/15/2017  . Major depressive disorder   . Obstructive sleep apnea on CPAP   . Poor flexibility of tendon 12/01/2018  . Subungual hematoma of digit of hand 12/01/2018  . Subungual hematoma of right foot 12/01/2018  . Thyroid disease   . Type 2 diabetes mellitus without complication, without long-term current use of insulin ([place]) 02/26/2017    Past Surgical History:  Procedure Laterality Date  . BYPASS GRAFT  2001  . CAROTID ARTERY ANGIOPLASTY    . TOTAL HIP ARTHROPLASTY  1994, 1995    There were no vitals filed for this visit.  Subjective Assessment - 03/08/19 1102    Subjective  "pain is better, flexibility varies with day and ability to donn socks/shoes" pt states he is doing stretches at home    Currently in Pain?  No/denies                       [facility] Adult PT Treatment/Exercise - 03/08/19 0001      Knee/Hip Exercises: Aerobic   Tread Mill  off pull 15x BIL    Recumbent Bike  L 2 6 min      Knee/Hip Exercises: Machines for Strengthening   Cybex Knee Extension  20lb 2x10    Cybex Knee Flexion  25# 2x10, RLE 15x 15#     Cybex Leg Press  50# 2 sets 15   calf raises 50# 2 sets 15     Knee/Hip Exercises: Standing   Other  Standing Knee Exercises  2x15 dead lift with 10# to /from 6" stepVC to llimit knee flexion      Manual Therapy   Manual Therapy  Passive ROM    Manual therapy comments  supine with MH    Passive ROM  Manual B HS, glute stretch 4 x 30 sec each                PT Short Term Goals - 12/22/18 1304      PT SHORT TERM GOAL #1   Title  independent with initial HEP    Status  Achieved        PT Long Term Goals - 02/16/19 1015      PT LONG TERM GOAL #3   Title  increase SLR bilat > 70 degrees ( 01/11/2019)    Baseline  RT 72   Left 58    Status  Partially Met      PT LONG TERM GOAL #4   Title  report ability to pick items up off the floor with minimal to no difficulty ( 01/11/2019)    Baseline  " i take  a knee, but can get up"    Status  Partially Met            Plan - 03/08/19 1126    Clinical Impression Statement  pt verb no HS pain but still motion resistions and fucn limitations d/t tight, pt verb doing stretches at home but remains very tight. increased ther ex in clinic today with no issues.    PT Treatment/Interventions  Patient/family education;Functional mobility training;Moist Heat;Ultrasound;Passive range of motion;Therapeutic exercise;Electrical Stimulation;Cryotherapy;Manual techniques;Taping    PT Next Visit Plan  ASSESS GOALS. Looking to D/C back to gym afternnext week       Patient will benefit from skilled therapeutic intervention in order to improve the following deficits and impairments:  Obesity, Impaired flexibility, Decreased range of motion  Visit Diagnosis: Stiffness of right hip, not elsewhere classified  Stiffness of left hip, not elsewhere classified     Problem List Patient Active Problem List   Diagnosis Date Noted  . Irritability and anger 01/18/2019  . Major depressive disorder   . Obstructive sleep apnea on CPAP   . Poor flexibility of tendon 12/01/2018  . Subungual hematoma of digit of hand 12/01/2018  . Choking 12/01/2018   . Subungual hematoma of right foot 12/01/2018  . Hypothyroidism 10/15/2017  . Type 2 diabetes mellitus without complication, without long-term current use of insulin ([place]) 02/26/2017  . Morbid obesity ([place]) 02/26/2017  . Essential hypertension 02/26/2017    Fleda Pagel,ANGIE PTA 03/08/2019, 11:28 AM  [address] [place], [place], [address] Phone: [phone]   Fax:  [phone]  Name: Raymond Christensen MRN: [id number] Date of Birth: 1940/04/06

## 2019-03-10 ENCOUNTER — Encounter: Payer: Self-pay | Admitting: Physical Therapy

## 2019-03-10 ENCOUNTER — Other Ambulatory Visit: Payer: Self-pay

## 2019-03-10 ENCOUNTER — Ambulatory Visit: Payer: Medicare (Managed Care) | Admitting: Physical Therapy

## 2019-03-10 ENCOUNTER — Ambulatory Visit (INDEPENDENT_AMBULATORY_CARE_PROVIDER_SITE_OTHER): Payer: Medicare (Managed Care) | Admitting: Psychology

## 2019-03-10 DIAGNOSIS — F3289 Other specified depressive episodes: Secondary | ICD-10-CM

## 2019-03-10 DIAGNOSIS — M25652 Stiffness of left hip, not elsewhere classified: Secondary | ICD-10-CM

## 2019-03-10 DIAGNOSIS — M25651 Stiffness of right hip, not elsewhere classified: Secondary | ICD-10-CM | POA: Diagnosis not present

## 2019-03-10 NOTE — Therapy (Signed)
[address] [place], [place], [address] Phone: [phone]   Fax:  [phone]  Physical Therapy Treatment  Patient Details  Name: Raymond Christensen MRN: [id number] Date of Birth: 30-Jun-1940 Referring Provider (PT): Dr Hart Carwin Rosita Kea   Encounter Date: 03/10/2019  PT End of Session - 03/10/19 1141    Visit Number  23    Date for PT Re-Evaluation  03/16/19    PT Start Time  1100    PT Stop Time  1151    PT Time Calculation (min)  51 min    Activity Tolerance  Patient tolerated treatment well    Behavior During Therapy  [facility] for tasks assessed/performed       Past Medical History:  Diagnosis Date  . Arthritis   . CAD (coronary artery disease)   . Concussion Summer 2012  . Essential hypertension 02/26/2017  . Heart disease   . Hyperlipidemia   . Hypothyroidism 10/15/2017  . Major depressive disorder   . Obstructive sleep apnea on CPAP   . Poor flexibility of tendon 12/01/2018  . Subungual hematoma of digit of hand 12/01/2018  . Subungual hematoma of right foot 12/01/2018  . Thyroid disease   . Type 2 diabetes mellitus without complication, without long-term current use of insulin ([place]) 02/26/2017    Past Surgical History:  Procedure Laterality Date  . BYPASS GRAFT  2001  . CAROTID ARTERY ANGIOPLASTY    . TOTAL HIP ARTHROPLASTY  1994, 1995    There were no vitals filed for this visit.  Subjective Assessment - 03/10/19 1100    Subjective  "Doing ok, but I am having trouble putting on my socks" "Back to square one"    Currently in Pain?  No/denies                       [facility] Adult PT Treatment/Exercise - 03/10/19 0001      Knee/Hip Exercises: Stretches   Gastroc Stretch  Right;Left;4 reps;10 seconds;20 seconds      Knee/Hip Exercises: Aerobic   Elliptical  I7 R 5 x 4 min     Nustep  L 5 1mn      Knee/Hip Exercises: Machines for Strengthening   Cybex Knee Flexion  25# x15,  SL 15lb 2x10 each      Cybex Leg Press  50# 2 sets 15    Other Machine  Rows and lats 25lb 3 sets 15      Knee/Hip Exercises: Standing   Other Standing Knee Exercises  dead lifts 3lb sumbells to 6 in box 2x10      Moist Heat Therapy   Number Minutes Moist Heat  12 Minutes    Moist Heat Location  Other (comment)   bilateral HS     Manual Therapy   Manual Therapy  Passive ROM    Manual therapy comments  supine     Passive ROM  Manual B HS, glute stretch 4 x 30 sec each                PT Short Term Goals - 12/22/18 1304      PT SHORT TERM GOAL #1   Title  independent with initial HEP    Status  Achieved        PT Long Term Goals - 03/10/19 1144      PT LONG TERM GOAL #2   Title  demo bilat hip flexion and IR WNL to  allow him to donn/doff socks and shoes easily ( 01/11/2019)    Status  Partially Met      PT LONG TERM GOAL #3   Title  increase SLR bilat > 70 degrees ( 01/11/2019)    Status  Partially Met      PT LONG TERM GOAL #4   Title  report ability to pick items up off the floor with minimal to no difficulty ( 01/11/2019)    Status  Partially Met            Plan - 03/10/19 1142    Clinical Impression Statement  Pt was able to tolerate exercise interventions without subjective complaints of pain. Bilateral HS tightness remains noted with passive stretching. he was unable to do dead lifts to 6 in box due to HS ROM limitations. He reports that he has not returned to swimming.    Personal Factors and Comorbidities  Age;Behavior Pattern;Comorbidity 3+    Examination-Activity Limitations  Dressing;Other    Stability/Clinical Decision Making  Stable/Uncomplicated    PT Frequency  2x / week    PT Duration  2 weeks    PT Treatment/Interventions  Patient/family education;Functional mobility training;Moist Heat;Ultrasound;Passive range of motion;Therapeutic exercise;Electrical Stimulation;Cryotherapy;Manual techniques;Taping       Patient will benefit from  skilled therapeutic intervention in order to improve the following deficits and impairments:  Obesity, Impaired flexibility, Decreased range of motion  Visit Diagnosis: Stiffness of right hip, not elsewhere classified  Stiffness of left hip, not elsewhere classified     Problem List Patient Active Problem List   Diagnosis Date Noted  . Irritability and anger 01/18/2019  . Major depressive disorder   . Obstructive sleep apnea on CPAP   . Poor flexibility of tendon 12/01/2018  . Subungual hematoma of digit of hand 12/01/2018  . Choking 12/01/2018  . Subungual hematoma of right foot 12/01/2018  . Hypothyroidism 10/15/2017  . Type 2 diabetes mellitus without complication, without long-term current use of insulin ([place]) 02/26/2017  . Morbid obesity ([place]) 02/26/2017  . Essential hypertension 02/26/2017    Scot Jun, PTA 03/10/2019, 11:45 AM  [place] [address] [place], [place], [address] Phone: [phone]   Fax:  [phone]  Name: Raymond Christensen MRN: [id number] Date of Birth: August 02, 1940

## 2019-03-11 ENCOUNTER — Other Ambulatory Visit: Payer: Self-pay | Admitting: Family Medicine

## 2019-03-15 ENCOUNTER — Other Ambulatory Visit: Payer: Self-pay

## 2019-03-15 ENCOUNTER — Encounter: Payer: Self-pay | Admitting: Physical Therapy

## 2019-03-15 ENCOUNTER — Ambulatory Visit: Payer: Medicare (Managed Care) | Attending: Family Medicine | Admitting: Physical Therapy

## 2019-03-15 DIAGNOSIS — M25651 Stiffness of right hip, not elsewhere classified: Secondary | ICD-10-CM | POA: Diagnosis not present

## 2019-03-15 DIAGNOSIS — M25652 Stiffness of left hip, not elsewhere classified: Secondary | ICD-10-CM

## 2019-03-15 NOTE — Therapy (Signed)
[address] [place], [place], [address] Phone: [phone]   Fax:  [phone]  Physical Therapy Treatment  Patient Details  Name: Raymond Christensen MRN: [id number] Date of Birth: 1940/12/31 Referring Provider (PT): Dr Hart Carwin Rosita Kea   Encounter Date: 03/15/2019  PT End of Session - 03/15/19 1144    Visit Number  24    Date for PT Re-Evaluation  03/16/19    PT Start Time  1100    PT Stop Time  1153    PT Time Calculation (min)  53 min    Activity Tolerance  Patient tolerated treatment well    Behavior During Therapy  Raymond Christensen for tasks assessed/performed       Past Medical History:  Diagnosis Date  . Arthritis   . CAD (coronary artery disease)   . Concussion Summer 2012  . Essential hypertension 02/26/2017  . Heart disease   . Hyperlipidemia   . Hypothyroidism 10/15/2017  . Major depressive disorder   . Obstructive sleep apnea on CPAP   . Poor flexibility of tendon 12/01/2018  . Subungual hematoma of digit of hand 12/01/2018  . Subungual hematoma of right foot 12/01/2018  . Thyroid disease   . Type 2 diabetes mellitus without complication, without long-term current use of insulin ([place]) 02/26/2017    Past Surgical History:  Procedure Laterality Date  . BYPASS GRAFT  2001  . CAROTID ARTERY ANGIOPLASTY    . TOTAL HIP ARTHROPLASTY  1994, 1995    There were no vitals filed for this visit.  Subjective Assessment - 03/15/19 1102    Subjective  "Doing ok"    Currently in Pain?  No/denies         [facility] PT Assessment - 03/15/19 0001      Flexibility   Hamstrings  supine SLR Rt 70, Lt 50                   [facility] Adult PT Treatment/Exercise - 03/15/19 0001      Knee/Hip Exercises: Stretches   Passive Hamstring Stretch  Both;4 reps;10 seconds;20 seconds    Gastroc Stretch  Right;Left;4 reps;10 seconds;20 seconds    Other Knee/Hip Stretches  piriformis stretch bilat 3 x 15 sec       Knee/Hip Exercises: Aerobic   Elliptical  I9 R 5 x 6 min     Recumbent Bike  L 6 40mn      Knee/Hip Exercises: Machines for Strengthening   Cybex Leg Press  50# 2 sets 15    Other Machine  Rows and lats 35lb 3 sets 10      Knee/Hip Exercises: Standing   Other Standing Knee Exercises  dead lifts 4lb sumbells to 8 in box 2x15    Other Standing Knee Exercises  straight arm pull downs 35lb 2x10       Knee/Hip Exercises: Supine   Other Supine Knee/Hip Exercises  feet on ball bridge, KTC and obl      Moist Heat Therapy   Number Minutes Moist Heat  11 Minutes    Moist Heat Location  Other (comment)   hamstrings bilat     Manual Therapy   Manual Therapy  Passive ROM    Manual therapy comments  supine     Passive ROM  Manual B HS, glut, adductor  stretch 4 x 30 sec each                PT Short Term Goals -  12/22/18 1304      PT SHORT TERM GOAL #1   Title  independent with initial HEP    Status  Achieved        PT Long Term Goals - 03/15/19 1149      PT LONG TERM GOAL #2   Title  demo bilat hip flexion and IR WNL to allow him to donn/doff socks and shoes easily ( 01/11/2019)    Status  Partially Met      PT LONG TERM GOAL #3   Title  increase SLR bilat > 70 degrees ( 01/11/2019)    Status  Partially Met            Plan - 03/15/19 1145    Clinical Impression Statement  Decrease SLR ROM noted. Bilateral HS tightness remains. No subjective complaints of pain noted with today's interventions. He reports no HS pain. He was able to touch 8 inch box during dead lifts.    Personal Factors and Comorbidities  Age;Behavior Pattern;Comorbidity 3+    Examination-Activity Limitations  Dressing;Other    Stability/Clinical Decision Making  Stable/Uncomplicated    PT Frequency  2x / week    PT Duration  2 weeks    PT Treatment/Interventions  Patient/family education;Functional mobility training;Moist Heat;Ultrasound;Passive range of motion;Therapeutic exercise;Electrical  Stimulation;Cryotherapy;Manual techniques;Taping    PT Next Visit Plan  D/C next visit       Patient will benefit from skilled therapeutic intervention in order to improve the following deficits and impairments:  Obesity, Impaired flexibility, Decreased range of motion  Visit Diagnosis: Stiffness of right hip, not elsewhere classified  Stiffness of left hip, not elsewhere classified     Problem List Patient Active Problem List   Diagnosis Date Noted  . Irritability and anger 01/18/2019  . Major depressive disorder   . Obstructive sleep apnea on CPAP   . Poor flexibility of tendon 12/01/2018  . Subungual hematoma of digit of hand 12/01/2018  . Choking 12/01/2018  . Subungual hematoma of right foot 12/01/2018  . Hypothyroidism 10/15/2017  . Type 2 diabetes mellitus without complication, without long-term current use of insulin ([place]) 02/26/2017  . Morbid obesity ([place]) 02/26/2017  . Essential hypertension 02/26/2017    Scot Jun, PTA 03/15/2019, 11:50 AM  [place] [address] [place], [place], [address] Phone: [phone]   Fax:  [phone]  Name: Raymond Christensen MRN: [id number] Date of Birth: [birth year]

## 2019-03-17 ENCOUNTER — Encounter: Payer: Medicare (Managed Care) | Admitting: Physical Therapy

## 2019-03-18 ENCOUNTER — Ambulatory Visit: Payer: Medicare (Managed Care) | Admitting: Physical Therapy

## 2019-03-19 ENCOUNTER — Ambulatory Visit: Payer: Medicare (Managed Care) | Admitting: Physical Therapy

## 2019-03-19 ENCOUNTER — Other Ambulatory Visit: Payer: Self-pay

## 2019-03-19 ENCOUNTER — Encounter: Payer: Self-pay | Admitting: Physical Therapy

## 2019-03-19 DIAGNOSIS — M25651 Stiffness of right hip, not elsewhere classified: Secondary | ICD-10-CM

## 2019-03-19 DIAGNOSIS — M25652 Stiffness of left hip, not elsewhere classified: Secondary | ICD-10-CM

## 2019-03-19 NOTE — Therapy (Signed)
[facility] [place], [place], [address] Phone: [phone]   Fax:  [phone]  Physical Therapy Treatment  Patient Details  Name: Raymond Christensen MRN: [id number] Date of Birth: 1940/02/18 Referring Provider (PT): Dr Hart Carwin Rosita Kea   Encounter Date: 03/19/2019  PT End of Session - 03/19/19 0938    Visit Number  25    Date for PT Re-Evaluation  04/15/19    PT Start Time  0845    PT Stop Time  0927    PT Time Calculation (min)  42 min    Activity Tolerance  Patient tolerated treatment well    Behavior During Therapy  [facility] for tasks assessed/performed       Past Medical History:  Diagnosis Date  . Arthritis   . CAD (coronary artery disease)   . Concussion Summer 2012  . Essential hypertension 02/26/2017  . Heart disease   . Hyperlipidemia   . Hypothyroidism 10/15/2017  . Major depressive disorder   . Obstructive sleep apnea on CPAP   . Poor flexibility of tendon 12/01/2018  . Subungual hematoma of digit of hand 12/01/2018  . Subungual hematoma of right foot 12/01/2018  . Thyroid disease   . Type 2 diabetes mellitus without complication, without long-term current use of insulin ([place]) 02/26/2017    Past Surgical History:  Procedure Laterality Date  . BYPASS GRAFT  2001  . CAROTID ARTERY ANGIOPLASTY    . TOTAL HIP ARTHROPLASTY  1994, 1995    There were no vitals filed for this visit.  Subjective Assessment - 03/19/19 0935    Subjective  Patient reports thta he lacks motivation to do anything    Currently in Pain?  No/denies                       [facility] Adult PT Treatment/Exercise - 03/19/19 0001      Ambulation/Gait   Gait Comments  gait around the building , short of breath with coming up the hill      Self-Care   Self-Care  Other Self-Care Comments    Other Self-Care Comments   went over multiple ways to move, get started, stretch, exercise, reports that he just can't get  started and lacks motivation, gave him examples physiologically of how not moving causes more and more issues, asked him about depression      Knee/Hip Exercises: Stretches   Passive Hamstring Stretch  Both;5 reps;20 seconds    Quad Stretch  Both;4 reps;10 seconds    Hip Flexor Stretch  Both;4 reps;10 seconds    Piriformis Stretch  Both;5 reps;20 seconds    Other Knee/Hip Stretches  trunk rotation stretch               PT Short Term Goals - 12/22/18 1304      PT SHORT TERM GOAL #1   Title  independent with initial HEP    Status  Achieved        PT Long Term Goals - 03/19/19 8144      PT LONG TERM GOAL #1   Title  I with finalized HEP for lower body flexibility ( 01/11/2019)    Status  Achieved      PT LONG TERM GOAL #2   Title  demo bilat hip flexion and IR WNL to allow him to donn/doff socks and shoes easily ( 01/11/2019)    Status  Partially Met      PT LONG  TERM GOAL #3   Title  increase SLR bilat > 70 degrees ( 01/11/2019)    Status  Partially Met      PT LONG TERM GOAL #4   Title  report ability to pick items up off the floor with minimal to no difficulty ( 01/11/2019)    Status  Partially Met            Plan - 03/19/19 0939    Clinical Impression Statement  I had a long talk with the patient during LE stretches on how to try to motivate himself, how to get started, and all the issues with him not moving and not stretching, he does report knowing this but just can't get started, talked about depression and that he may need to speak with his MD about this, he agrees.   He remains extremely tight in the LE's which limits his mobility for walking, putting on socks etc.., he is very out of shape being very short of breath for about 10 minutes after walking 600 feet and a slight slope    PT Next Visit Plan  D/C with lack of progress, he reports that he knows what he needs to do, but reports he has difficulty getting started    Consulted and Agree with Plan of  Care  Patient       Patient will benefit from skilled therapeutic intervention in order to improve the following deficits and impairments:     Visit Diagnosis: Stiffness of right hip, not elsewhere classified  Stiffness of left hip, not elsewhere classified     Problem List Patient Active Problem List   Diagnosis Date Noted  . Irritability and anger 01/18/2019  . Major depressive disorder   . Obstructive sleep apnea on CPAP   . Poor flexibility of tendon 12/01/2018  . Subungual hematoma of digit of hand 12/01/2018  . Choking 12/01/2018  . Subungual hematoma of right foot 12/01/2018  . Hypothyroidism 10/15/2017  . Type 2 diabetes mellitus without complication, without long-term current use of insulin ([facility] Christ[facility]) 02/26/2017  . Morbid obesity (Raymond Christensen) 02/26/2017  . Essential hypertension 02/26/2017    Sumner Boast., PT 03/19/2019, 9:43 AM  [address] [place], [place], [address] Phone: [phone]   Fax:  [phone]  Name: Raymond Christensen MRN: [id number] Date of Birth: 15-Dec-1940

## 2019-03-26 LAB — HM DIABETES EYE EXAM

## 2019-03-30 ENCOUNTER — Other Ambulatory Visit: Payer: Self-pay

## 2019-03-31 ENCOUNTER — Ambulatory Visit (INDEPENDENT_AMBULATORY_CARE_PROVIDER_SITE_OTHER): Payer: Medicare (Managed Care) | Admitting: Family Medicine

## 2019-03-31 ENCOUNTER — Telehealth: Payer: Self-pay

## 2019-03-31 ENCOUNTER — Encounter: Payer: Self-pay | Admitting: Family Medicine

## 2019-03-31 ENCOUNTER — Ambulatory Visit (INDEPENDENT_AMBULATORY_CARE_PROVIDER_SITE_OTHER): Payer: Medicare (Managed Care) | Admitting: Psychology

## 2019-03-31 VITALS — BP 142/80 | HR 103 | Temp 96.5°F | Ht 68.0 in | Wt 255.4 lb

## 2019-03-31 DIAGNOSIS — R454 Irritability and anger: Secondary | ICD-10-CM

## 2019-03-31 DIAGNOSIS — R413 Other amnesia: Secondary | ICD-10-CM | POA: Diagnosis not present

## 2019-03-31 DIAGNOSIS — F3289 Other specified depressive episodes: Secondary | ICD-10-CM | POA: Diagnosis not present

## 2019-03-31 DIAGNOSIS — I10 Essential (primary) hypertension: Secondary | ICD-10-CM | POA: Diagnosis not present

## 2019-03-31 DIAGNOSIS — E781 Pure hyperglyceridemia: Secondary | ICD-10-CM | POA: Diagnosis not present

## 2019-03-31 MED ORDER — DEXTROMETHORPHAN-QUINIDINE 20-10 MG PO CAPS: 1.0000 | ORAL_CAPSULE | Freq: Every day | ORAL | 2 refills | Status: DC

## 2019-03-31 MED ORDER — OMEGA-3-ACID ETHYL ESTERS 1 G PO CAPS: 2.0000 g | ORAL_CAPSULE | Freq: Two times a day (BID) | ORAL | 2 refills | Status: DC

## 2019-03-31 MED ORDER — RAMIPRIL 10 MG PO CAPS: 10.0000 mg | ORAL_CAPSULE | Freq: Two times a day (BID) | ORAL | 3 refills | Status: DC

## 2019-03-31 NOTE — Patient Instructions (Addendum)
OK to stop the mirtazapine.   Let me know if there are cost issues with the new medicine.  Let us know if you need anything.

## 2019-03-31 NOTE — Progress Notes (Signed)
Chief Complaint  Patient presents with  . Follow-up    medications    Subjective: Patient is a 79 y.o. male here for f/u.  Pt w hx of memory loss and agitation/irritability. He has failed several SSRI/SNRI/NRI's, most recently Remeron. No AE's, just no positive effect. His wife's psychiatrist recommended Nuedexta and he is interested. He has never seen a psychiatrist.   Past Medical History:  Diagnosis Date  . Arthritis   . CAD (coronary artery disease)   . Concussion Summer 2012  . Essential hypertension 02/26/2017  . Heart disease   . Hyperlipidemia   . Hypothyroidism 10/15/2017  . Major depressive disorder   . Obstructive sleep apnea on CPAP   . Poor flexibility of tendon 12/01/2018  . Subungual hematoma of digit of hand 12/01/2018  . Subungual hematoma of right foot 12/01/2018  . Thyroid disease   . Type 2 diabetes mellitus without complication, without long-term current use of insulin ([place]) 02/26/2017    Objective: BP (!) 142/80 (BP Location: Left Arm, Patient Position: Sitting, Cuff Size: Large)   Pulse (!) 103   Temp (!) 96.5 F (35.8 C) (Temporal)   Ht 5\' 8"  (1.727 m)   Wt 255 lb 6 oz (115.8 kg)   SpO2 97%   BMI 38.83 kg/m  General: Awake, appears stated age HEENT: MMM, EOMi Heart: RRR, 2+ pitting LE edema tapering at knees Lungs: CTAB, no rales, wheezes or rhonchi. No accessory muscle use Psych: Normal affect and mood  Assessment and Plan: Irritability and anger - Plan: Dextromethorphan-quiNIDine (NUEDEXTA) 20-10 MG capsule  Memory difficulty - Plan: Dextromethorphan-quiNIDine (NUEDEXTA) 20-10 MG capsule  Essential hypertension - Plan: ramipril (ALTACE) 10 MG capsule  Hypertriglyceridemia - Plan: omega-3 acid ethyl esters (LOVAZA) 1 g capsule  1- Stop Remeron, start Nuedexta. He prefers to stay on a lower dose rather than taking twice daily. Will reck in 4 weeks. 2- OK w BP given age. Cont meds. 3- Cont Lovaza.  The patient voiced understanding and  agreement to the plan.  [place], DO 03/31/19  3:37 PM

## 2019-03-31 NOTE — Telephone Encounter (Signed)
PA initiated via Covermymeds; KEY: [id number]. Awaiting determination.

## 2019-04-01 ENCOUNTER — Telehealth: Payer: Self-pay | Admitting: Family Medicine

## 2019-04-01 NOTE — Telephone Encounter (Signed)
RX: NUEDEXTA      More information needed for  Prior Authorization for medication    Chart Notes, from 3 months ago to  indentify  Laughing and crying episodes and CNSLS Scores    Fax : [phone]  Telephone: [phone]

## 2019-04-01 NOTE — Telephone Encounter (Signed)
Error. See other telephone note.

## 2019-04-01 NOTE — Telephone Encounter (Signed)
RX: NUEDEXTA    More information needed for  Prior Authorization for medication   Chart Notes, from 3 months ago to  indentify  Laughing and crying episodes and CNSLS Scores   Fax : [phone]

## 2019-04-02 NOTE — Telephone Encounter (Signed)
I called and spoke to the pharmacist and in order for this medication to be approved they will need the past 3 months of office notes documenting laughing and crying episodes, and also will need his CNSLS Score. This can be faxed to number in this message.

## 2019-04-02 NOTE — Telephone Encounter (Signed)
Called informed the patient.  He stated that he would discuss with his wife what to do and will let PCP know

## 2019-04-02 NOTE — Telephone Encounter (Signed)
We don't have a CNSLS on file and he has not been crying/laughing to my knowledge. It was recommended by a local psychiatrist and he wanted to try it. It seems it won't be covered. Verify and we can discuss referring to psychiatry if need be. Ty.

## 2019-04-02 NOTE — Telephone Encounter (Signed)
Can we find out what questions? It would be easier this way. Thanks.

## 2019-04-02 NOTE — Telephone Encounter (Signed)
Insurance calling again to check on this. Informed that provider would need to call and answer additional questions when he gets time as he is in clinic today.

## 2019-04-07 ENCOUNTER — Telehealth: Payer: Self-pay | Admitting: Family Medicine

## 2019-04-07 NOTE — Telephone Encounter (Signed)
Dextromethorphan-quiNIDine (NUEDEXTA) 20-10 MG capsule  Prior auth this medication was denied on 04/03/2019 per [facility], they would like to know if we are going to appeal.   Please advise

## 2019-04-07 NOTE — Telephone Encounter (Signed)
This has been addressed already//insurance will not cover//patient is aware

## 2019-04-08 NOTE — Telephone Encounter (Signed)
Rep from [place] want to know whats the next step for this patient. Her callback number is [phone] with a reference # of [id number]

## 2019-04-09 NOTE — Telephone Encounter (Signed)
This has been addressed with the patient and PCP. Below copies from previous telephone note regarding this medication. Patient currently will discuss with his wife and let PCP know  We don't have a CNSLS on file and he has not been crying/laughing to my knowledge. It was recommended by a local psychiatrist and he wanted to try it. It seems it won't be covered. Verify and we can discuss referring to psychiatry if need be. Ty.

## 2019-04-23 ENCOUNTER — Ambulatory Visit: Payer: Medicare (Managed Care) | Admitting: Psychology

## 2019-04-27 ENCOUNTER — Encounter: Payer: Self-pay | Admitting: Family Medicine

## 2019-04-27 ENCOUNTER — Ambulatory Visit: Payer: Medicare (Managed Care) | Admitting: Family Medicine

## 2019-04-27 ENCOUNTER — Other Ambulatory Visit: Payer: Self-pay

## 2019-04-27 VITALS — BP 126/80 | HR 96 | Temp 96.1°F | Ht 68.0 in | Wt 254.2 lb

## 2019-04-27 DIAGNOSIS — R600 Localized edema: Secondary | ICD-10-CM

## 2019-04-27 MED ORDER — FUROSEMIDE 40 MG PO TABS: 40.0000 mg | ORAL_TABLET | Freq: Every day | ORAL | 3 refills | Status: DC

## 2019-04-27 MED ORDER — POTASSIUM CHLORIDE ER 10 MEQ PO TBCR: EXTENDED_RELEASE_TABLET | ORAL | 1 refills | Status: DC

## 2019-04-27 NOTE — Progress Notes (Signed)
Chief Complaint  Patient presents with  . Edema    Raymond Christensen here for bilateral leg swelling.  Duration: 3 weeks Hx of prolonged bedrest, recent surgery, travel or injury? No Pain the calf? No SOB? No Personal or family history of clot or bleeding disorder? No Hx of heart failure, renal failure, hepatic failure? No  Diet is fair, not a lot of salt. Elevating legs not particularly helpful.  Swims for exercise, staying active.   Past Medical History:  Diagnosis Date  . Arthritis   . CAD (coronary artery disease)   . Concussion Summer 2012  . Essential hypertension 02/26/2017  . Heart disease   . Hyperlipidemia   . Hypothyroidism 10/15/2017  . Major depressive disorder   . Obstructive sleep apnea on CPAP   . Poor flexibility of tendon 12/01/2018  . Subungual hematoma of digit of hand 12/01/2018  . Subungual hematoma of right foot 12/01/2018  . Thyroid disease   . Type 2 diabetes mellitus without complication, without long-term current use of insulin ([place]) 02/26/2017   Family History  Problem Relation Age of Onset  . Dementia Father        Concerns surrounding Lewy body dementia, but never officially diagnosed  . Cancer Neg Hx    Past Surgical History:  Procedure Laterality Date  . BYPASS GRAFT  2001  . CAROTID ARTERY ANGIOPLASTY    . TOTAL HIP ARTHROPLASTY  1994, 1995    Current Outpatient Medications:  .  Armodafinil 250 MG tablet, Take by mouth., Disp: , Rfl:  .  aspirin EC 81 MG tablet, Take by mouth., Disp: , Rfl:  .  chlorthalidone (HYGROTON) 25 MG tablet, Take 1 tablet (25 mg total) by mouth daily., Disp: 90 tablet, Rfl: 0 .  Cholecalciferol (VITAMIN D3) 10000 units TABS, Take by mouth., Disp: , Rfl:  .  Cyanocobalamin (VITAMIN B-12 PO), Take by mouth., Disp: , Rfl:  .  Dextromethorphan-quiNIDine (NUEDEXTA) 20-10 MG capsule, Take 1 capsule by mouth daily., Disp: 30 capsule, Rfl: 2 .  ezetimibe (ZETIA) 10 MG tablet, TAKE 1 TABLET DAILY, Disp: 90 tablet, Rfl:  1 .  finasteride (PROSCAR) 5 MG tablet, TAKE 1 TABLET DAILY, Disp: 90 tablet, Rfl: 1 .  levocetirizine (XYZAL) 5 MG tablet, TAKE 1 TABLET EVERY EVENING, Disp: 90 tablet, Rfl: 1 .  levothyroxine (SYNTHROID) 75 MCG tablet, Take 1 tablet (75 mcg total) by mouth daily., Disp: 90 tablet, Rfl: 1 .  liothyronine (CYTOMEL) 5 MCG tablet, Take 2 tablets (10 mcg total) by mouth daily., Disp: 180 tablet, Rfl: 3 .  metoprolol succinate (TOPROL-XL) 50 MG 24 hr tablet, Take by mouth., Disp: , Rfl:  .  Multiple Vitamins-Minerals (MULTIVITAMIN ADULT EXTRA C PO), Take by mouth., Disp: , Rfl:  .  omega-3 acid ethyl esters (LOVAZA) 1 g capsule, Take 2 capsules (2 g total) by mouth 2 (two) times daily., Disp: 360 capsule, Rfl: 2 .  ONETOUCH DELICA LANCETS [id number] MISC, FSBS daily, Disp: , Rfl:  .  ramipril (ALTACE) 10 MG capsule, Take 1 capsule (10 mg total) by mouth 2 (two) times daily., Disp: 180 capsule, Rfl: 3 .  simvastatin (ZOCOR) 40 MG tablet, Take 1 tablet (40 mg total) by mouth daily at 6 PM., Disp: 90 tablet, Rfl: 3 .  tamsulosin (FLOMAX) 0.4 MG CAPS capsule, Take 1 capsule (0.4 mg total) by mouth daily., Disp: 90 capsule, Rfl: 3 .  Zinc 50 MG TABS, Take by mouth., Disp: , Rfl:  .  furosemide (LASIX) 40 MG  tablet, Take 1 tablet (40 mg total) by mouth daily., Disp: 30 tablet, Rfl: 3 .  potassium chloride (KLOR-CON 10) 10 MEQ tablet, Take 2 tabs with every 40 mg dose of Lasix., Disp: 60 tablet, Rfl: 1  BP 126/80 (BP Location: Left Arm, Patient Position: Sitting, Cuff Size: Normal)   Pulse 96   Temp (!) 96.1 F (35.6 C) (Temporal)   Ht 5\' 8"  (1.727 m)   Wt 254 lb 4 oz (115.3 kg)   SpO2 98%   BMI 38.66 kg/m  Gen- awake, alert, appears stated age Heart- RRR, 1+ BLE edema tapering at prox 1/3 tibia and ending at knees Lungs- CTAB, normal effort w/o accessory muscle use MSK- no calf pain Psych: Age appropriate judgment and insight  Bilateral lower extremity edema - Plan: Basic metabolic panel, potassium  chloride (KLOR-CON 10) 10 MEQ tablet, furosemide (LASIX) 40 MG tablet  W heart hx, will trial furosemide with supplemental potassium 20 mEq.  Elevate legs, prescription for compression stockings provided, stay active, mind salt intake.  Follow-up in 1 week to recheck BMP, I will see him in around 1 month. Pt voiced understanding and agreement to the plan.  [place], DO 04/27/19  4:35 PM

## 2019-04-27 NOTE — Patient Instructions (Signed)
For the swelling in your lower extremities, be sure to elevate your legs when able, mind the salt intake, stay physically active and consider wearing compression stockings.  Give [place] 2-3 business days to get the results of your labs back.   Let us know if you need anything.

## 2019-04-28 ENCOUNTER — Other Ambulatory Visit: Payer: Self-pay | Admitting: Family Medicine

## 2019-04-28 DIAGNOSIS — R600 Localized edema: Secondary | ICD-10-CM

## 2019-04-28 MED ORDER — POTASSIUM CHLORIDE ER 10 MEQ PO TBCR: EXTENDED_RELEASE_TABLET | ORAL | 1 refills | Status: DC

## 2019-05-04 ENCOUNTER — Ambulatory Visit: Payer: Medicare (Managed Care) | Admitting: Family Medicine

## 2019-05-04 ENCOUNTER — Other Ambulatory Visit: Payer: Self-pay

## 2019-05-04 ENCOUNTER — Other Ambulatory Visit (INDEPENDENT_AMBULATORY_CARE_PROVIDER_SITE_OTHER): Payer: Medicare (Managed Care)

## 2019-05-04 DIAGNOSIS — R600 Localized edema: Secondary | ICD-10-CM

## 2019-05-05 LAB — BASIC METABOLIC PANEL
BUN: 30 mg/dL — ABNORMAL HIGH (ref 6–23)
CO2: 33 mEq/L — ABNORMAL HIGH (ref 19–32)
Calcium: 9.3 mg/dL (ref 8.4–10.5)
Chloride: 98 mEq/L (ref 96–112)
Creatinine, Ser: 1.24 mg/dL (ref 0.40–1.50)
GFR: 56.44 mL/min — ABNORMAL LOW (ref >60.00)
Glucose, Bld: 102 mg/dL — ABNORMAL HIGH (ref 70–99)
Potassium: 3.9 mEq/L (ref 3.5–5.1)
Sodium: 140 mEq/L (ref 135–145)

## 2019-05-14 ENCOUNTER — Other Ambulatory Visit: Payer: Self-pay | Admitting: Family Medicine

## 2019-05-17 ENCOUNTER — Ambulatory Visit (INDEPENDENT_AMBULATORY_CARE_PROVIDER_SITE_OTHER): Payer: Medicare (Managed Care) | Admitting: Psychology

## 2019-05-17 DIAGNOSIS — F3289 Other specified depressive episodes: Secondary | ICD-10-CM | POA: Diagnosis not present

## 2019-05-28 ENCOUNTER — Ambulatory Visit: Payer: Medicare (Managed Care) | Admitting: Family Medicine

## 2019-05-30 ENCOUNTER — Other Ambulatory Visit: Payer: Self-pay | Admitting: Family Medicine

## 2019-05-30 DIAGNOSIS — I10 Essential (primary) hypertension: Secondary | ICD-10-CM

## 2019-06-01 ENCOUNTER — Ambulatory Visit: Payer: 59 | Admitting: Family Medicine

## 2019-06-02 ENCOUNTER — Encounter: Payer: Self-pay | Admitting: Neurology

## 2019-06-02 ENCOUNTER — Ambulatory Visit: Payer: Medicare (Managed Care) | Admitting: Neurology

## 2019-06-02 ENCOUNTER — Telehealth: Payer: Self-pay | Admitting: Family Medicine

## 2019-06-02 ENCOUNTER — Other Ambulatory Visit: Payer: Self-pay

## 2019-06-02 VITALS — BP 136/84 | HR 84 | Resp 20 | Ht 68.0 in | Wt 251.0 lb

## 2019-06-02 DIAGNOSIS — F330 Major depressive disorder, recurrent, mild: Secondary | ICD-10-CM

## 2019-06-02 DIAGNOSIS — R413 Other amnesia: Secondary | ICD-10-CM

## 2019-06-02 DIAGNOSIS — F411 Generalized anxiety disorder: Secondary | ICD-10-CM | POA: Diagnosis not present

## 2019-06-02 DIAGNOSIS — I10 Essential (primary) hypertension: Secondary | ICD-10-CM

## 2019-06-02 NOTE — Progress Notes (Signed)
NEUROLOGY FOLLOW UP OFFICE NOTE  Raymond Christensen [id number] 1940/11/18  HISTORY OF PRESENT ILLNESS: I had the pleasure of seeing Raymond Christensen in follow-up in the neurology clinic on 06/02/2019.  The patient was last seen 7 months ago for memory loss. He is alone in the office today. Since his last visit, he underwent repeat Neuropsychological testing last 12/2018. Records were reviewed, results were largely within normal limits. There were relative weakness across visual encoding (ie learning) and on isolated tasks assessing working memory, mental spatial manipulation, and cognitive adaptability, with the latter showing mild declines relative to 2013 evaluation. Despite these, it was felt he does not warrant a diagnosis of mild neurocognitive disorder. Cognitive difficulties felt likely related to a combination of psychiatric distress (mild range for acute symptoms of depression and anxiety), and history of cardiovascular illness. Findings were discussed today. He reports that his short-term memory is not good, which frustrates him, taking it out on his wife mostly. He denies getting lost driving. He denies missing medication or bills. He sees a psychologist every other week and thinks this helps with his anger management. A psychiatrist in [place] previously suggested he try Neudexta, he was taking mirtazapine ODT until a couple of days ago, he stopped it because it "played havoc on my taste buds." He denies any headaches, dizziness, focal numbness/tingling/weakness, no falls. Sleep is good.   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 He reports his memory is worsening, "especially if you speak with my wife who can see more of it." Several times during the visit, he and his wife would have disagreements and would roll each others eyes or defend  themselves. He states he does not remember things as well as he used to. He has gotten lost driving a few times over the past month, most recently last Saturday. He would take the wrong turn. He states it is mostly at night but his wife shakes her head. His wife states he refuses to use GPS. They were coming home from [place] and he was not sure which way to turn, or took the wrong exit off the highway. He gets so angry when he is in the car that she gets nervous. He states "I never do anything right." He rolls his eyes several times when she reports that he does not get the car washed regularly, and forgot to take the deer alerts when he went for a car wash and they broke. He continues to manage bills, but 2 months ago she noticed he was not balancing his checkbook right. There have been 3 incidents with his checkbook. Last week they found out there was no money in the checking account, they went to the bank and he could not understand why. He states what he did not understand at that point was that he had overdone withdrawals. He manages his medications independently and denies missing doses. He has always had a hard time multitasking, and this is probably worse. His wife agrees. Over the past 6 months, he has been repeating himself more, asking what time his appointment is repeatedly. Over the past year, she has noticed his handwriting has become more scribbly, he thinks he writes the same way.She notes increased irritability over the past year, with a lot of anger and yelling. She complains about his lack of exercise and lack of initiative. No paranoia or hallucinations. Sleep  is good. He was recently started on Donepezil 5mg  daily, no side effects.  He has been previously evaluated 2 centers for memory loss. 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 He had repeat testing  at [facility] in 2016 with weakness in working memory and some attention, but testing within normal range compared to peers, no decline noted, and it was thought that his cognitive weakness was due to mood issues (depression). He was started on Duloxetine with some good response. He again had normal cognitive testing in February 2019 and duloxetine dose was further increased. His wife continued to report lack of empathy and initiative. His last MOCA in April 2019 was 28/30. He had a PET scan in 2019 which showed relatively symmetric uptake of FDG in the brain with no definite patterns of abnormal FDG distribution identified. His last MRI brain was in 2016 showing moderate global atrophy, prominent bifrontal subarachnoid spaces, minimal chronic microvascular disease.   He denies any headaches, dizziness, vision changes, dysarthria/dysphagia, neck/back pain, focal numbness/tingling/weakness, bowel/ dysfunction, anosmia, or tremors. He has "semi-incontinence with urine." He uses modafinil for excessive daytime sleepiness and has a CPAP for sleep apnea. He reports mood is okay in general but he does admit to getting irritated easily. He states he feels like he is being overprotected by his wife "who is micromanaging me."    PAST MEDICAL HISTORY: Past Medical History:  Diagnosis Date  . Arthritis   . CAD (coronary artery disease)   . Concussion Summer 2012  . Essential hypertension 02/26/2017  . Heart disease   . Hyperlipidemia   . Hypothyroidism 10/15/2017  . Major depressive disorder   . Obstructive sleep apnea on CPAP   . Poor flexibility of tendon 12/01/2018  . Subungual hematoma of digit of hand 12/01/2018  . Subungual hematoma of right foot 12/01/2018  . Thyroid disease   . Type 2 diabetes mellitus without complication, without long-term current use of insulin ([place]) 02/26/2017    MEDICATIONS: Current Outpatient Medications on File Prior to Visit  Medication Sig Dispense Refill  . Armodafinil 250  MG tablet Take by mouth.    Marland Kitchen aspirin EC 81 MG tablet Take by mouth.    . Cholecalciferol (VITAMIN D3) 10000 units TABS Take by mouth.    . Cyanocobalamin (VITAMIN B-12 PO) Take by mouth.    . donepezil (ARICEPT) 5 MG tablet Take 5 mg by mouth at bedtime.    Marland Kitchen ezetimibe (ZETIA) 10 MG tablet TAKE 1 TABLET DAILY 90 tablet 1  . finasteride (PROSCAR) 5 MG tablet TAKE 1 TABLET DAILY 90 tablet 1  . furosemide (LASIX) 40 MG tablet Take 1 tablet (40 mg total) by mouth daily. 30 tablet 3  . levocetirizine (XYZAL) 5 MG tablet TAKE 1 TABLET EVERY EVENING 90 tablet 1  . levothyroxine (SYNTHROID) 75 MCG tablet TAKE 1 TABLET DAILY 90 tablet 1  . liothyronine (CYTOMEL) 5 MCG tablet Take 2 tablets (10 mcg total) by mouth daily. 180 tablet 3  . metoprolol succinate (TOPROL-XL) 50 MG 24 hr tablet Take by mouth.    . Multiple Vitamins-Minerals (MULTIVITAMIN ADULT EXTRA C PO) Take by mouth.    . omega-3 acid ethyl esters (LOVAZA) 1 g capsule Take 2 capsules (2 g total) by mouth 2 (two) times daily. 360 capsule 2  . potassium chloride (KLOR-CON 10) 10 MEQ tablet Take 2 tabs with every 40 mg  dose of Lasix. 180 tablet 1  . ramipril (ALTACE) 10 MG capsule Take 1 capsule (10 mg total) by mouth 2 (two) times daily. 180 capsule 3  . simvastatin (ZOCOR) 40 MG tablet TAKE 1 TABLET DAILY AT 6PM 90 tablet 3  . tamsulosin (FLOMAX) 0.4 MG CAPS capsule Take 1 capsule (0.4 mg total) by mouth daily. 90 capsule 3  . Dextromethorphan-quiNIDine (NUEDEXTA) 20-10 MG capsule Take 1 capsule by mouth daily. 30 capsule 2  . ONETOUCH DELICA LANCETS [id number] MISC FSBS daily    . Zinc 50 MG TABS Take by mouth.     No current facility-administered medications on file prior to visit.    ALLERGIES: Allergies  Allergen Reactions  . Sulfa Antibiotics Rash    Questionable, he developed a diffuse rash 2 days after stopping Bactrim    FAMILY HISTORY: Family History  Problem Relation Age of Onset  . Dementia Father        Concerns  surrounding Lewy body dementia, but never officially diagnosed  . Cancer Neg Hx     SOCIAL HISTORY: Social History   Socioeconomic History  . Marital status: Married    Spouse name: Not on file  . Number of children: Not on file  . Years of education: 37  . Highest education level: Doctorate  Occupational History  . Not on file  Tobacco Use  . Smoking status: Former Smoker    Types: Cigarettes  . Smokeless tobacco: Never Used  Substance and Sexual Activity  . Alcohol use: Yes    Alcohol/week: 3.0 standard drinks    Types: 3 Standard drinks or equivalent per week    Comment: 1 drink 3 times per week  . Drug use: No  . Sexual activity: Yes  Other Topics Concern  . 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 Difficulty of Paying Living Expenses: Not hard at all  Food Insecurity:   . Worried About Charity fundraiser in the Last Year:   . Arboriculturist in the Last Year:   Transportation Needs:   . Film/video editor (Medical):   Marland Kitchen Lack of Transportation (Non-Medical):   Physical Activity:   . Days of Exercise per Week:   . Minutes of Exercise per Session:   Stress:   . Feeling of Stress :   Social Connections:   . Frequency of Communication with Friends and Family:   . Frequency of Social Gatherings with Friends and Family:   . Attends Religious Services:   . Active Member of Clubs or Organizations:   . Attends Archivist Meetings:   Marland Kitchen Marital Status:   Intimate Partner Violence:   . Fear of Current or Ex-Partner:   . Emotionally Abused:   Marland Kitchen Physically Abused:   . Sexually Abused:     PHYSICAL EXAM: Vitals:   06/02/19 1505  BP: 136/84  Pulse: 84  Resp: 20  SpO2: 95%   General: No acute distress Head:   Normocephalic/atraumatic Skin/Extremities: No rash, no edema Neurological Exam: alert and oriented to person, place, and time. No aphasia or dysarthria. Fund of knowledge is appropriate.  Recent and remote memory are intact.  Attention and concentration  are normal.   Cranial nerves: Pupils equal, round, reactive to light.  Extraocular movements intact with no nystagmus. No facial asymmetry.  Motor: moves all extremities symmetrically. Gait narrow-based and steady.   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 Repeat Neuropsychological testing (this is his third) again was within normal, with no findings indicating a neurodegenerative condition. Cognitive difficulties felt likely due to a combination of psychiatric distress and history of cardiovascular illness. No significant decline compared to prior testing. MRI brain no acute changes, there was note of bifrontal extra-axial fluid collections, 86mm on right, 46mm on left, consistent with bifrontal CSF hygromas, mild flattening of frontal lobes bilaterally. CSF hygromas also seen over the cerebellar hemispheres bilaterally. Findings were discussed today, he continues to see a psychologist and is asking about Nuedexta to help with his anger issues. Recommend psychiatry referral. He will follow-up prn and knows to call for any changes.    Thank you for allowing me to participate in his care.  Please do not hesitate to call for any questions or concerns.   Ellouise Newer, M.D.   CC: Dr. Nani Ravens

## 2019-06-02 NOTE — Patient Instructions (Signed)
1. I will try to get the Neudexta approved  2. Please confirm with medication bottles at home if you are taking the Donepezil and let us know  3. Continue follow-up with Psychologist and potentially psychiatrist  4. Follow-up as needed, call for any changes    RECOMMENDATIONS FOR ALL PATIENTS WITH MEMORY PROBLEMS: 1. Continue to exercise (Recommend 30 minutes of walking everyday, or 3 hours every week) 2. Increase social interactions - continue going to [place] and enjoy social gatherings with friends and family 3. Eat healthy, avoid fried foods and eat more fruits and vegetables 4. Maintain adequate blood pressure, blood sugar, and blood cholesterol level. Reducing the risk of stroke and cardiovascular disease also helps promoting better memory. 5. Avoid stressful situations. Live a simple life and avoid aggravations. Organize your time and prepare for the next day in anticipation. 6. Sleep well, avoid any interruptions of sleep and avoid any distractions in the bedroom that may interfere with adequate sleep quality 7. Avoid sugar, avoid sweets as there is a strong link between excessive sugar intake, diabetes, and cognitive impairment The Mediterranean diet has been shown to help patients reduce the risk of progressive memory disorders and reduces cardiovascular risk. This includes eating fish, eat fruits and green leafy vegetables, nuts like almonds and hazelnuts, walnuts, and also use olive oil. Avoid fast foods and fried foods as much as possible. Avoid sweets and sugar as sugar use has been linked to worsening of memory function.

## 2019-06-02 NOTE — Telephone Encounter (Signed)
Tried calling [facility] regarding prescription, unable to reach someone.

## 2019-06-02 NOTE — Telephone Encounter (Signed)
CallerScoey Monty  Call Back # [phone]  Patient states that [facility] did not receive prescription re-fill for :chlorthalidone (HYGROTON) 25 MG tablet.  Patient made aware that prescription was sent on 05/31/2019. Patient would like script resent .

## 2019-06-03 MED ORDER — CHLORTHALIDONE 25 MG PO TABS: 25.0000 mg | ORAL_TABLET | Freq: Every day | ORAL | 0 refills | Status: DC

## 2019-06-03 NOTE — Telephone Encounter (Signed)
Medication resent

## 2019-06-07 ENCOUNTER — Ambulatory Visit: Payer: Medicare (Managed Care) | Admitting: Family Medicine

## 2019-06-15 ENCOUNTER — Ambulatory Visit (INDEPENDENT_AMBULATORY_CARE_PROVIDER_SITE_OTHER): Payer: Medicare (Managed Care) | Admitting: Psychology

## 2019-06-15 ENCOUNTER — Telehealth: Payer: Self-pay | Admitting: Family Medicine

## 2019-06-15 DIAGNOSIS — F3289 Other specified depressive episodes: Secondary | ICD-10-CM

## 2019-06-15 NOTE — Telephone Encounter (Signed)
[place]  [phone]  Calling in regards to prior authorization for patient's medication Nuedexta. Per representative , they need more information for prior auth.

## 2019-06-15 NOTE — Telephone Encounter (Signed)
Called the number below informed of information from previous note regarding this medication. See Below.  She then stated she would let the patient know and help him find a psy.    This has been addressed with the patient and PCP. Below copies from previous telephone note regarding this medication. Patient currently will discuss with his wife and let PCP know  We don't have a CNSLS on file and he has not been crying/laughing to my knowledge. It was recommended by a local psychiatrist and he wanted to try it. It seems it won't be covered. Verify and we can discuss referring to psychiatry if need be. Ty.

## 2019-06-22 ENCOUNTER — Encounter: Payer: Self-pay | Admitting: Family Medicine

## 2019-06-22 ENCOUNTER — Ambulatory Visit: Payer: Medicare (Managed Care) | Admitting: Family Medicine

## 2019-06-22 ENCOUNTER — Other Ambulatory Visit: Payer: Self-pay

## 2019-06-22 VITALS — BP 120/72 | HR 80 | Temp 96.0°F | Ht 68.0 in | Wt 245.4 lb

## 2019-06-22 DIAGNOSIS — E039 Hypothyroidism, unspecified: Secondary | ICD-10-CM

## 2019-06-22 DIAGNOSIS — E1169 Type 2 diabetes mellitus with other specified complication: Secondary | ICD-10-CM

## 2019-06-22 DIAGNOSIS — E669 Obesity, unspecified: Secondary | ICD-10-CM | POA: Diagnosis not present

## 2019-06-22 DIAGNOSIS — R454 Irritability and anger: Secondary | ICD-10-CM | POA: Diagnosis not present

## 2019-06-22 NOTE — Patient Instructions (Signed)
If you do not hear anything about your referral in the next 1-2 weeks, call our office and ask for an update.  Give us 2-3 business days to get the results of your labs back.   Keep the diet clean and stay active.  Aim to do some physical exertion for 150 minutes per week. This is typically divided into 5 days per week, 30 minutes per day. The activity should be enough to get your heart rate up. Anything is better than nothing if you have time constraints.  Let us know if you need anything. 

## 2019-06-22 NOTE — Progress Notes (Signed)
Subjective:   Chief Complaint  Patient presents with  . Follow-up    6 month    Bari Handshoe is a 79 y.o. male here for follow-up of diabetes.   [place] does not routinely check his sugars.  Patient does not require insulin.   Medications include: diet controlled Exercise: walking Diet is fair  +continued issues with anger and irritability. Has failed many serotonin and NE/dopamine affecting agents. Has not seen psych.  Has a metallic taste in his mouth that is lingering after stopping Remeron. Wondering if he is taking any other meds that could contribute.   Thyroid issues, takes Synthroid and Cytomel. Compliant. Does have some afternoon fatigue.   Past Medical History:  Diagnosis Date  . Arthritis   . CAD (coronary artery disease)   . Concussion Summer 2012  . Essential hypertension 02/26/2017  . Heart disease   . Hyperlipidemia   . Hypothyroidism 10/15/2017  . Major depressive disorder   . Obstructive sleep apnea on CPAP   . Poor flexibility of tendon 12/01/2018  . Subungual hematoma of digit of hand 12/01/2018  . Subungual hematoma of right foot 12/01/2018  . Thyroid disease   . Type 2 diabetes mellitus without complication, without long-term current use of insulin ([place]) 02/26/2017     Related testing: Date of retinal exam: Done Pneumovax: done  Objective:  BP 120/72 (BP Location: Left Arm, Patient Position: Sitting, Cuff Size: Normal)   Pulse 80   Temp (!) 96 F (35.6 C) (Temporal)   Ht 5\' 8"  (1.727 m)   Wt 245 lb 6 oz (111.3 kg)   SpO2 99%   BMI 37.31 kg/m  General:  Well developed, well nourished, in no apparent distress Lungs:  CTAB, no access msc use Cardio:  RRR, no bruits, 1+ b/l LE edema Psych: Age appropriate judgment and insight  Assessment:   Diabetes mellitus type 2 in obese ([place]) - Plan: Comprehensive metabolic panel, Hemoglobin A1c, Lipid panel, Microalbumin / creatinine urine ratio  Irritability and anger - Plan: Ambulatory referral to  Psychiatry  Hypothyroidism, unspecified type - Plan: TSH, T4, free   Plan:   1- Counseled on diet and exercise. Ck a1c.  2- consult Psych team 3- Ck thyroid levels Has had metallic taste in mouth, discussed w pharmacy team and could be related to [place]. Informed pt via [place].  F/u in 6 mo pending above.  The patient voiced understanding and agreement to the plan.  [place], DO 06/22/19 2:56 PM

## 2019-06-23 ENCOUNTER — Other Ambulatory Visit: Payer: Self-pay | Admitting: Family Medicine

## 2019-06-23 DIAGNOSIS — E781 Pure hyperglyceridemia: Secondary | ICD-10-CM

## 2019-06-23 LAB — T4, FREE: Free T4: 0.72 ng/dL (ref 0.60–1.60)

## 2019-06-23 LAB — COMPREHENSIVE METABOLIC PANEL
ALT: 39 U/L (ref 0–53)
AST: 31 U/L (ref 0–37)
Albumin: 4.7 g/dL (ref 3.5–5.2)
Alkaline Phosphatase: 65 U/L (ref 39–117)
BUN: 28 mg/dL — ABNORMAL HIGH (ref 6–23)
CO2: 33 mEq/L — ABNORMAL HIGH (ref 19–32)
Calcium: 9.9 mg/dL (ref 8.4–10.5)
Chloride: 99 mEq/L (ref 96–112)
Creatinine, Ser: 1.33 mg/dL (ref 0.40–1.50)
GFR: 52.04 mL/min — ABNORMAL LOW (ref >60.00)
Glucose, Bld: 106 mg/dL — ABNORMAL HIGH (ref 70–99)
Potassium: 4.2 mEq/L (ref 3.5–5.1)
Sodium: 139 mEq/L (ref 135–145)
Total Bilirubin: 0.5 mg/dL (ref 0.2–1.2)
Total Protein: 7.1 g/dL (ref 6.0–8.3)

## 2019-06-23 LAB — MICROALBUMIN / CREATININE URINE RATIO
Creatinine,U: 32.1 mg/dL
Microalb Creat Ratio: 2.2 mg/g (ref 0.0–30.0)
Microalb, Ur: <0.7 mg/dL (ref 0.0–1.9)

## 2019-06-23 LAB — LIPID PANEL
Cholesterol: 155 mg/dL (ref 0–200)
HDL: 39.8 mg/dL (ref >39.00)
NonHDL: 115.33
Total CHOL/HDL Ratio: 4
Triglycerides: 311 mg/dL — ABNORMAL HIGH (ref 0.0–149.0)
VLDL: 62.2 mg/dL — ABNORMAL HIGH (ref 0.0–40.0)

## 2019-06-23 LAB — HEMOGLOBIN A1C: Hgb A1c MFr Bld: 6.6 % — ABNORMAL HIGH (ref 4.6–6.5)

## 2019-06-23 LAB — TSH: TSH: 2.19 u[IU]/mL (ref 0.35–4.50)

## 2019-06-23 LAB — LDL CHOLESTEROL, DIRECT: Direct LDL: 76 mg/dL

## 2019-07-02 ENCOUNTER — Other Ambulatory Visit: Payer: Self-pay | Admitting: Family Medicine

## 2019-07-08 ENCOUNTER — Ambulatory Visit: Payer: Medicare (Managed Care) | Admitting: Psychology

## 2019-07-22 ENCOUNTER — Ambulatory Visit (INDEPENDENT_AMBULATORY_CARE_PROVIDER_SITE_OTHER): Payer: Medicare (Managed Care) | Admitting: Psychology

## 2019-07-22 DIAGNOSIS — F3289 Other specified depressive episodes: Secondary | ICD-10-CM | POA: Diagnosis not present

## 2019-07-26 ENCOUNTER — Other Ambulatory Visit: Payer: Self-pay | Admitting: Family Medicine

## 2019-08-15 DIAGNOSIS — I499 Cardiac arrhythmia, unspecified: Secondary | ICD-10-CM

## 2019-08-15 HISTORY — DX: Cardiac arrhythmia, unspecified: I49.9

## 2019-08-16 ENCOUNTER — Ambulatory Visit (INDEPENDENT_AMBULATORY_CARE_PROVIDER_SITE_OTHER): Payer: Medicare (Managed Care) | Admitting: Psychology

## 2019-08-16 ENCOUNTER — Telehealth: Payer: Self-pay

## 2019-08-16 ENCOUNTER — Other Ambulatory Visit: Payer: Self-pay

## 2019-08-16 ENCOUNTER — Other Ambulatory Visit: Payer: Medicare (Managed Care)

## 2019-08-16 DIAGNOSIS — F3289 Other specified depressive episodes: Secondary | ICD-10-CM | POA: Diagnosis not present

## 2019-08-16 NOTE — Telephone Encounter (Signed)
Nurse Assessment Nurse: Self, RN, Nira Conn Date/Time (Eastern Time): 08/16/2019 7:46:50 AM Confirm and document reason for call. If symptomatic, describe symptoms. ---Caller says he has a cold , caller says it is Head congestion since Thursday, no fever. Has the patient had close contact with a person known or suspected to have the novel coronavirus illness OR traveled / lives in area with major community spread (including international travel) in the last 14 days from the onset of symptoms? * If Asymptomatic, screen for exposure and travel within the last 14 days. ---No Does the patient have any new or worsening symptoms? ---Yes Will a triage be completed? ---Yes Related visit to physician within the last 2 weeks? ---No Does the PT have any chronic conditions? (i.e. diabetes, asthma, this includes High risk factors for pregnancy, etc.) ---Yes List chronic conditions. ---Cardiac Is this a behavioral health or substance abuse call? ---No Guidelines Guideline Title Affirmed Question Affirmed Notes Nurse Date/Time (Eastern Time) Common Cold Common cold with no complications Self, RN, Nira Conn 08/16/2019 7:48:30 AM Disp. Time Eilene Ghazi Time) Disposition Final User 08/16/2019 7:50:37 AM Home Care Yes Self, RN, Heather PLEASE NOTE: All timestamps contained within this report are represented as [place] Standard Time. CONFIDENTIALTY NOTICE: This fax transmission is intended only for the addressee. It contains information that is legally privileged, confidential or otherwise protected from use or disclosure. If you are not the intended recipient, you are strictly prohibited from reviewing, disclosing, copying using or disseminating any of this information or taking any action in reliance on or regarding this information. If you have received this fax in error, please notify us immediately by telephone so that we can arrange for its return to [place]. Phone: [phone], Toll-Free: [phone], Fax:  [phone] Page: 2 of 2 Call Id: [id number] Caller Disagree/Comply Comply Caller Understands Yes PreDisposition Call Doctor Care Advice Given Per Guideline HOME CARE: * You should be able to treat this at home. * Colds are very common and may make you feel uncomfortable. FOR A RUNNY NOSE - BLOW YOUR NOSE: * Nasal mucus and discharge help wash viruses and bacteria out of the nose and sinuses. NASAL WASHES - STEP-BY-STEP INSTRUCTIONS: HUMIDIFIER: * If the air in your home is dry, use a humidifier. EXPECTED COURSE: * Fever 2 to 3 days * Nasal discharge 7-14 days CALL BACK IF: * You become short of breath * You become worse. CARE ADVICE given per Common Cold (Adult) guideline

## 2019-08-16 NOTE — Telephone Encounter (Signed)
Duplicate message sent already

## 2019-08-19 ENCOUNTER — Other Ambulatory Visit: Payer: Self-pay

## 2019-08-19 ENCOUNTER — Other Ambulatory Visit: Payer: Self-pay | Admitting: Family Medicine

## 2019-08-19 ENCOUNTER — Telehealth (INDEPENDENT_AMBULATORY_CARE_PROVIDER_SITE_OTHER): Payer: Medicare (Managed Care) | Admitting: Family Medicine

## 2019-08-19 DIAGNOSIS — J40 Bronchitis, not specified as acute or chronic: Secondary | ICD-10-CM | POA: Diagnosis not present

## 2019-08-19 DIAGNOSIS — E1169 Type 2 diabetes mellitus with other specified complication: Secondary | ICD-10-CM | POA: Diagnosis not present

## 2019-08-19 DIAGNOSIS — I10 Essential (primary) hypertension: Secondary | ICD-10-CM

## 2019-08-19 DIAGNOSIS — E669 Obesity, unspecified: Secondary | ICD-10-CM | POA: Diagnosis not present

## 2019-08-19 MED ORDER — CEFDINIR 300 MG PO CAPS
300.0000 mg | ORAL_CAPSULE | Freq: Two times a day (BID) | ORAL | 0 refills | Status: AC
Start: 2019-08-19 — End: 2019-08-29

## 2019-08-19 MED ORDER — BENZONATATE 100 MG PO CAPS
100.0000 mg | ORAL_CAPSULE | Freq: Two times a day (BID) | ORAL | 0 refills | Status: DC | PRN
Start: 2019-08-19 — End: 2019-09-30

## 2019-08-19 MED ORDER — ACETAMINOPHEN-CODEINE 120-12 MG/5ML PO SOLN: 5.0000 mL | Freq: Every evening | ORAL | 0 refills | Status: DC | PRN

## 2019-08-19 NOTE — Assessment & Plan Note (Signed)
Monitor and report any concerns 

## 2019-08-19 NOTE — Progress Notes (Signed)
Virtual Visit via Video Note  I connected with Raymond Christensen on 08/19/19 at 10:20 AM EDT by a video enabled telemedicine application and verified that I am speaking with the correct person using two identifiers.  Location: Patient: home, patient, wife and provider all in visit Provider: home   I discussed the limitations of evaluation and management by telemedicine and the availability of in person appointments. The patient expressed understanding and agreed to proceed. Kem Boroughs, CMA was able to get the patient setup in a video visit    Subjective:    Patient ID: Raymond Christensen, male    DOB: 07-12-1940, 79 y.o.   MRN: [id number]  Chief Complaint  Patient presents with  . Cough    HPI Patient is in today for evaluation of cough. Patient has just returned from [place] after visiting family all of whom were vaccinated.  He started developing symptoms of congestion last Thursday a week ago today.  He was tested for Covid on this Tuesday and tested negative he reports cough sore throat postnasal drip sinus congestion malaise, fatigue but denies any obvious fevers or chills.  He is not improving.  He has tried Advil, Best boy and Mucinex with minimal results. Denies palp/HA/chills/fevers/GI or GU c/o. Taking meds as prescribed.  Past Medical History:  Diagnosis Date  . Arthritis   . CAD (coronary artery disease)   . Concussion Summer 2012  . Essential hypertension 02/26/2017  . Heart disease   . Hyperlipidemia   . Hypothyroidism 10/15/2017  . Major depressive disorder   . Obstructive sleep apnea on CPAP   . Poor flexibility of tendon 12/01/2018  . Subungual hematoma of digit of hand 12/01/2018  . Subungual hematoma of right foot 12/01/2018  . Thyroid disease   . Type 2 diabetes mellitus without complication, without long-term current use of insulin ([place]) 02/26/2017    Past Surgical History:  Procedure Laterality Date  . BYPASS GRAFT  2001  . CAROTID  ARTERY ANGIOPLASTY    . TOTAL HIP ARTHROPLASTY  1994, 1995    Family History  Problem Relation Age of Onset  . Dementia Father        Concerns surrounding Lewy body dementia, but never officially diagnosed  . Cancer Neg Hx     Social History   Socioeconomic History  . Marital status: Married    Spouse name: Not on file  . Number of children: Not on file  . Years of education: 25  . Highest education level: Doctorate  Occupational History  . Not on file  Tobacco Use  . Smoking status: Former Smoker    Types: Cigarettes  . Smokeless tobacco: Never Used  Vaping Use  . Vaping Use: Never used  Substance and Sexual Activity  . Alcohol use: Yes    Alcohol/week: 3.0 standard drinks    Types: 3 Standard drinks or equivalent per week    Comment: 1 drink 3 times per week  . Drug use: No  . Sexual activity: Yes  Other Topics Concern  . 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 Difficulty of Paying Living Expenses: Not hard at all  Food Insecurity:   . Worried About  Running Out of Food in the Last Year:   . [place] in the Last Year:   Transportation Needs:   . Lack of Transportation (Medical):   Marland Kitchen Lack of Transportation (Non-Medical):   Physical Activity:   . Days of Exercise per Week:   . Minutes of Exercise per Session:   Stress:   . Feeling of Stress :   Social Connections:   . Frequency of Communication with Friends and Family:   . Frequency of Social Gatherings with Friends and Family:   . Attends Religious Services:   . Active Member of Clubs or Organizations:   . Attends Archivist Meetings:   Marland Kitchen Marital Status:   Intimate Partner Violence:   . Fear of Current or Ex-Partner:   . Emotionally Abused:   Marland Kitchen Physically  Abused:   . Sexually Abused:     Outpatient Medications Prior to Visit  Medication Sig Dispense Refill  . Armodafinil 250 MG tablet Take by mouth.    Marland Kitchen aspirin EC 81 MG tablet Take by mouth.    . chlorthalidone (HYGROTON) 25 MG tablet Take 1 tablet (25 mg total) by mouth daily. 90 tablet 0  . Cholecalciferol (VITAMIN D3) 10000 units TABS Take by mouth.    . Cyanocobalamin (VITAMIN B-12 PO) Take by mouth.    . ezetimibe (ZETIA) 10 MG tablet TAKE 1 TABLET DAILY 90 tablet 1  . finasteride (PROSCAR) 5 MG tablet TAKE 1 TABLET DAILY 90 tablet 1  . furosemide (LASIX) 40 MG tablet Take 1 tablet (40 mg total) by mouth daily. 30 tablet 3  . levocetirizine (XYZAL) 5 MG tablet TAKE 1 TABLET EVERY EVENING 90 tablet 1  . levothyroxine (SYNTHROID) 75 MCG tablet TAKE 1 TABLET DAILY 90 tablet 1  . liothyronine (CYTOMEL) 5 MCG tablet Take 2 tablets (10 mcg total) by mouth daily. 180 tablet 3  . metoprolol succinate (TOPROL-XL) 50 MG 24 hr tablet Take by mouth.    . Multiple Vitamins-Minerals (MULTIVITAMIN ADULT EXTRA C PO) Take by mouth.    . omega-3 acid ethyl esters (LOVAZA) 1 g capsule Take 2 capsules (2 g total) by mouth 2 (two) times daily. 360 capsule 2  . potassium chloride (KLOR-CON 10) 10 MEQ tablet Take 2 tabs with every 40 mg dose of Lasix. 180 tablet 1  . ramipril (ALTACE) 10 MG capsule Take 1 capsule (10 mg total) by mouth 2 (two) times daily. 180 capsule 3  . simvastatin (ZOCOR) 40 MG tablet TAKE 1 TABLET DAILY AT 6PM 90 tablet 3  . tamsulosin (FLOMAX) 0.4 MG CAPS capsule Take 1 capsule (0.4 mg total) by mouth daily. 90 capsule 3  . Zinc 50 MG TABS Take by mouth.     No facility-administered medications prior to visit.    Allergies  Allergen Reactions  . Sulfa Antibiotics Rash    Questionable, he developed a diffuse rash 2 days after stopping Bactrim    Review of Systems  Constitutional: Positive for malaise/fatigue. Negative for fever.  HENT: Positive for congestion. Negative for  nosebleeds.   Eyes: Negative for blurred vision.  Respiratory: Positive for cough and shortness of breath.   Cardiovascular: Positive for chest pain. Negative for palpitations and leg swelling.  Gastrointestinal: Negative for abdominal pain, blood in stool and nausea.  Genitourinary: Negative for dysuria and frequency.  Musculoskeletal: Negative for falls and myalgias.  Skin: Negative for rash.  Neurological: Negative for dizziness, loss of consciousness and headaches.  Endo/Heme/Allergies: Negative for environmental  allergies.  Psychiatric/Behavioral: Negative for depression. The patient is not nervous/anxious.        Objective:    Physical Exam Constitutional:      Appearance: Normal appearance. He is not ill-appearing.  HENT:     Head: Normocephalic and atraumatic.     Right Ear: External ear normal.     Left Ear: External ear normal.     Nose: Nose normal.  Eyes:     General:        Right eye: No discharge.        Left eye: No discharge.  Pulmonary:     Effort: Pulmonary effort is normal.  Neurological:     Mental Status: He is alert and oriented to person, place, and time.  Psychiatric:        Behavior: Behavior normal.     BP 130/78   Pulse 88   Temp (!) 97 F (36.1 C)   SpO2 96%  Wt Readings from Last 3 Encounters:  06/22/19 245 lb 6 oz (111.3 kg)  06/02/19 251 lb (113.9 kg)  04/27/19 254 lb 4 oz (115.3 kg)    Diabetic Foot Exam - Simple   No data filed     Lab Results  Component Value Date   WBC 7.0 12/01/2018   HGB 14.3 12/01/2018   HCT 44.2 12/01/2018   PLT 145.0 (L) 12/01/2018   GLUCOSE 106 (H) 06/22/2019   CHOL 155 06/22/2019   TRIG 311.0 (H) 06/22/2019   HDL 39.80 06/22/2019   LDLDIRECT 76.0 06/22/2019   LDLCALC 83 12/01/2018   ALT 39 06/22/2019   AST 31 06/22/2019   NA 139 06/22/2019   K 4.2 06/22/2019   CL 99 06/22/2019   CREATININE 1.33 06/22/2019   BUN 28 (H) 06/22/2019   CO2 33 (H) 06/22/2019   TSH 2.19 06/22/2019   HGBA1C 6.6  (H) 06/22/2019   MICROALBUR <0.7 06/22/2019    Lab Results  Component Value Date   TSH 2.19 06/22/2019   Lab Results  Component Value Date   WBC 7.0 12/01/2018   HGB 14.3 12/01/2018   HCT 44.2 12/01/2018   MCV 89.9 12/01/2018   PLT 145.0 (L) 12/01/2018   Lab Results  Component Value Date   NA 139 06/22/2019   K 4.2 06/22/2019   CO2 33 (H) 06/22/2019   GLUCOSE 106 (H) 06/22/2019   BUN 28 (H) 06/22/2019   CREATININE 1.33 06/22/2019   BILITOT 0.5 06/22/2019   ALKPHOS 65 06/22/2019   AST 31 06/22/2019   ALT 39 06/22/2019   PROT 7.1 06/22/2019   ALBUMIN 4.7 06/22/2019   CALCIUM 9.9 06/22/2019   GFR 52.04 (L) 06/22/2019   Lab Results  Component Value Date   CHOL 155 06/22/2019   Lab Results  Component Value Date   HDL 39.80 06/22/2019   Lab Results  Component Value Date   LDLCALC 83 12/01/2018   Lab Results  Component Value Date   TRIG 311.0 (H) 06/22/2019   Lab Results  Component Value Date   CHOLHDL 4 06/22/2019   Lab Results  Component Value Date   HGBA1C 6.6 (H) 06/22/2019       Assessment & Plan:   Problem List Items Addressed This Visit    Essential hypertension    Monitor and report any concerns      Diabetes mellitus type 2 in obese ([place])    Mild, diet controlled. Unlikely to cause him difficulty during acute infection      Bronchitis  Patient has just returned from [place] after visiting family all of whom were vaccinated.  He started developing symptoms of congestion last Thursday a week ago today.  He was tested for Covid on this Tuesday and tested negative he reports cough sore throat postnasal drip sinus congestion malaise, fatigue but denies any obvious fevers or chills.  He is not improving.  He has tried Advil, Best boy and Mucinex with minimal results.  Due to risk factors and persistence of illness we will start him on cefdinir, refill Tessalon Perles to use 3 times daily as needed during the day and he is allowed  Tylenol with codeine liquid to use nightly as needed which she has tolerated with good effect in the past.  He is encouraged to also add a probiotic, increase hydration and increase rest and report if he does not improve.         I am having Raymond Christensen start on cefdinir, benzonatate, and acetaminophen-codeine. I am also having him maintain his Cyanocobalamin (VITAMIN B-12 PO), Vitamin D3, Zinc, Multiple Vitamins-Minerals (MULTIVITAMIN ADULT EXTRA C PO), metoprolol succinate, aspirin EC, Armodafinil, tamsulosin, liothyronine, finasteride, ezetimibe, ramipril, omega-3 acid ethyl esters, furosemide, potassium chloride, simvastatin, levothyroxine, chlorthalidone, and levocetirizine.  Meds ordered this encounter  Medications  . cefdinir (OMNICEF) 300 MG capsule    Sig: Take 1 capsule (300 mg total) by mouth 2 (two) times daily for 10 days.    Dispense:  20 capsule    Refill:  0  . benzonatate (TESSALON) 100 MG capsule    Sig: Take 1 capsule (100 mg total) by mouth 2 (two) times daily as needed for cough.    Dispense:  60 capsule    Refill:  0  . acetaminophen-codeine 120-12 MG/5ML solution    Sig: Take 5 mLs by mouth at bedtime as needed for moderate pain (cough).    Dispense:  120 mL    Refill:  0     I discussed the assessment and treatment plan with the patient. The patient was provided an opportunity to ask questions and all were answered. The patient agreed with the plan and demonstrated an understanding of the instructions.   The patient was advised to call back or seek an in-person evaluation if the symptoms worsen or if the condition fails to improve as anticipated.  I provided 20 minutes of non-face-to-face time during this encounter.   Penni Homans, MD

## 2019-08-19 NOTE — Assessment & Plan Note (Signed)
Mild, diet controlled. Unlikely to cause him difficulty during acute infection

## 2019-08-19 NOTE — Assessment & Plan Note (Signed)
Patient has just returned from [place] after visiting family all of whom were vaccinated.  He started developing symptoms of congestion last Thursday a week ago today.  He was tested for Covid on this Tuesday and tested negative he reports cough sore throat postnasal drip sinus congestion malaise, fatigue but denies any obvious fevers or chills.  He is not improving.  He has tried Advil, Best boy and Mucinex with minimal results.  Due to risk factors and persistence of illness we will start him on cefdinir, refill Tessalon Perles to use 3 times daily as needed during the day and he is allowed Tylenol with codeine liquid to use nightly as needed which she has tolerated with good effect in the past.  He is encouraged to also add a probiotic, increase hydration and increase rest and report if he does not improve.

## 2019-09-01 ENCOUNTER — Other Ambulatory Visit: Payer: Self-pay | Admitting: Family Medicine

## 2019-09-01 DIAGNOSIS — R454 Irritability and anger: Secondary | ICD-10-CM

## 2019-09-06 ENCOUNTER — Other Ambulatory Visit: Payer: Self-pay | Admitting: Family Medicine

## 2019-09-07 ENCOUNTER — Other Ambulatory Visit: Payer: Self-pay

## 2019-09-07 ENCOUNTER — Inpatient Hospital Stay (HOSPITAL_BASED_OUTPATIENT_CLINIC_OR_DEPARTMENT_OTHER)
Admission: EM | Admit: 2019-09-07 | Discharge: 2019-09-14 | DRG: 246 | Disposition: A | Discharge status: Home / Self Care | Payer: Medicare (Managed Care) | Attending: Internal Medicine | Admitting: Internal Medicine

## 2019-09-07 ENCOUNTER — Ambulatory Visit: Payer: Medicare (Managed Care) | Admitting: Medical

## 2019-09-07 ENCOUNTER — Ambulatory Visit (HOSPITAL_BASED_OUTPATIENT_CLINIC_OR_DEPARTMENT_OTHER)
Admission: RE | Admit: 2019-09-07 | Discharge: 2019-09-07 | Disposition: A | Discharge status: Home / Self Care | Payer: Medicare (Managed Care) | Source: Ambulatory Visit | Attending: Medical | Admitting: Medical

## 2019-09-07 ENCOUNTER — Telehealth: Payer: Self-pay | Admitting: Medical

## 2019-09-07 ENCOUNTER — Encounter (HOSPITAL_BASED_OUTPATIENT_CLINIC_OR_DEPARTMENT_OTHER): Payer: Self-pay | Admitting: *Deleted

## 2019-09-07 VITALS — BP 130/78 | HR 88 | Temp 98.3°F | Resp 18 | Wt 241.1 lb

## 2019-09-07 DIAGNOSIS — I513 Intracardiac thrombosis, not elsewhere classified: Secondary | ICD-10-CM | POA: Diagnosis present

## 2019-09-07 DIAGNOSIS — Z951 Presence of aortocoronary bypass graft: Secondary | ICD-10-CM

## 2019-09-07 DIAGNOSIS — E039 Hypothyroidism, unspecified: Secondary | ICD-10-CM | POA: Diagnosis present

## 2019-09-07 DIAGNOSIS — I25110 Atherosclerotic heart disease of native coronary artery with unstable angina pectoris: Secondary | ICD-10-CM | POA: Diagnosis present

## 2019-09-07 DIAGNOSIS — Z955 Presence of coronary angioplasty implant and graft: Secondary | ICD-10-CM

## 2019-09-07 DIAGNOSIS — R1013 Epigastric pain: Secondary | ICD-10-CM | POA: Diagnosis not present

## 2019-09-07 DIAGNOSIS — R059 Cough, unspecified: Secondary | ICD-10-CM

## 2019-09-07 DIAGNOSIS — I340 Nonrheumatic mitral (valve) insufficiency: Secondary | ICD-10-CM

## 2019-09-07 DIAGNOSIS — R5383 Other fatigue: Secondary | ICD-10-CM

## 2019-09-07 DIAGNOSIS — I480 Paroxysmal atrial fibrillation: Secondary | ICD-10-CM | POA: Diagnosis not present

## 2019-09-07 DIAGNOSIS — I252 Old myocardial infarction: Secondary | ICD-10-CM

## 2019-09-07 DIAGNOSIS — R05 Cough: Secondary | ICD-10-CM

## 2019-09-07 DIAGNOSIS — M79674 Pain in right toe(s): Secondary | ICD-10-CM

## 2019-09-07 DIAGNOSIS — M1990 Unspecified osteoarthritis, unspecified site: Secondary | ICD-10-CM | POA: Diagnosis present

## 2019-09-07 DIAGNOSIS — R778 Other specified abnormalities of plasma proteins: Secondary | ICD-10-CM | POA: Diagnosis present

## 2019-09-07 DIAGNOSIS — D696 Thrombocytopenia, unspecified: Secondary | ICD-10-CM | POA: Diagnosis present

## 2019-09-07 DIAGNOSIS — Z6836 Body mass index (BMI) 36.0-36.9, adult: Secondary | ICD-10-CM

## 2019-09-07 DIAGNOSIS — I200 Unstable angina: Secondary | ICD-10-CM

## 2019-09-07 DIAGNOSIS — R7989 Other specified abnormal findings of blood chemistry: Secondary | ICD-10-CM | POA: Diagnosis present

## 2019-09-07 DIAGNOSIS — E669 Obesity, unspecified: Secondary | ICD-10-CM | POA: Diagnosis present

## 2019-09-07 DIAGNOSIS — G4733 Obstructive sleep apnea (adult) (pediatric): Secondary | ICD-10-CM

## 2019-09-07 DIAGNOSIS — I4819 Other persistent atrial fibrillation: Secondary | ICD-10-CM

## 2019-09-07 DIAGNOSIS — E876 Hypokalemia: Secondary | ICD-10-CM | POA: Diagnosis present

## 2019-09-07 DIAGNOSIS — I110 Hypertensive heart disease with heart failure: Secondary | ICD-10-CM | POA: Diagnosis present

## 2019-09-07 DIAGNOSIS — I2510 Atherosclerotic heart disease of native coronary artery without angina pectoris: Secondary | ICD-10-CM | POA: Diagnosis present

## 2019-09-07 DIAGNOSIS — I214 Non-ST elevation (NSTEMI) myocardial infarction: Principal | ICD-10-CM | POA: Diagnosis present

## 2019-09-07 DIAGNOSIS — Z20822 Contact with and (suspected) exposure to covid-19: Secondary | ICD-10-CM | POA: Diagnosis present

## 2019-09-07 DIAGNOSIS — I4891 Unspecified atrial fibrillation: Secondary | ICD-10-CM

## 2019-09-07 DIAGNOSIS — Z9989 Dependence on other enabling machines and devices: Secondary | ICD-10-CM

## 2019-09-07 DIAGNOSIS — M10072 Idiopathic gout, left ankle and foot: Secondary | ICD-10-CM

## 2019-09-07 DIAGNOSIS — E1169 Type 2 diabetes mellitus with other specified complication: Secondary | ICD-10-CM | POA: Diagnosis present

## 2019-09-07 DIAGNOSIS — E785 Hyperlipidemia, unspecified: Secondary | ICD-10-CM | POA: Diagnosis present

## 2019-09-07 DIAGNOSIS — Z79899 Other long term (current) drug therapy: Secondary | ICD-10-CM

## 2019-09-07 DIAGNOSIS — R600 Localized edema: Secondary | ICD-10-CM

## 2019-09-07 DIAGNOSIS — I5022 Chronic systolic (congestive) heart failure: Secondary | ICD-10-CM

## 2019-09-07 DIAGNOSIS — I5043 Acute on chronic combined systolic (congestive) and diastolic (congestive) heart failure: Secondary | ICD-10-CM | POA: Diagnosis present

## 2019-09-07 DIAGNOSIS — I25810 Atherosclerosis of coronary artery bypass graft(s) without angina pectoris: Secondary | ICD-10-CM | POA: Diagnosis present

## 2019-09-07 DIAGNOSIS — N400 Enlarged prostate without lower urinary tract symptoms: Secondary | ICD-10-CM | POA: Diagnosis present

## 2019-09-07 DIAGNOSIS — I25709 Atherosclerosis of coronary artery bypass graft(s), unspecified, with unspecified angina pectoris: Secondary | ICD-10-CM | POA: Diagnosis present

## 2019-09-07 DIAGNOSIS — Z7982 Long term (current) use of aspirin: Secondary | ICD-10-CM

## 2019-09-07 DIAGNOSIS — E119 Type 2 diabetes mellitus without complications: Secondary | ICD-10-CM | POA: Diagnosis present

## 2019-09-07 DIAGNOSIS — Z87891 Personal history of nicotine dependence: Secondary | ICD-10-CM

## 2019-09-07 LAB — CBC
HCT: 41.2 % (ref 39.0–52.0)
Hemoglobin: 13.5 g/dL (ref 13.0–17.0)
MCH: 29.2 pg (ref 26.0–34.0)
MCHC: 32.8 g/dL (ref 30.0–36.0)
MCV: 89.2 fL (ref 80.0–100.0)
Platelets: 143 10*3/uL — ABNORMAL LOW (ref 150–400)
RBC: 4.62 MIL/uL (ref 4.22–5.81)
RDW: 13.6 % (ref 11.5–15.5)
WBC: 9 10*3/uL (ref 4.0–10.5)
nRBC: 0 % (ref 0.0–0.2)

## 2019-09-07 LAB — BASIC METABOLIC PANEL
Anion gap: 9 (ref 5–15)
BUN: 24 mg/dL — ABNORMAL HIGH (ref 8–23)
CO2: 27 mmol/L (ref 22–32)
Calcium: 8.9 mg/dL (ref 8.9–10.3)
Chloride: 102 mmol/L (ref 98–111)
Creatinine, Ser: 0.92 mg/dL (ref 0.61–1.24)
GFR calc Af Amer: >60 mL/min (ref >60)
GFR calc non Af Amer: >60 mL/min (ref >60)
Glucose, Bld: 148 mg/dL — ABNORMAL HIGH (ref 70–99)
Potassium: 3.2 mmol/L — ABNORMAL LOW (ref 3.5–5.1)
Sodium: 138 mmol/L (ref 135–145)

## 2019-09-07 LAB — URINALYSIS, ROUTINE W REFLEX MICROSCOPIC
Bilirubin Urine: NEGATIVE
Glucose, UA: NEGATIVE mg/dL
Hgb urine dipstick: NEGATIVE
Ketones, ur: NEGATIVE mg/dL
Leukocytes,Ua: NEGATIVE
Nitrite: NEGATIVE
Protein, ur: NEGATIVE mg/dL
Specific Gravity, Urine: 1.025 (ref 1.005–1.030)
pH: 5.5 (ref 5.0–8.0)

## 2019-09-07 LAB — TROPONIN I (HIGH SENSITIVITY): Troponin I (High Sensitivity): 314 ng/L (ref <18)

## 2019-09-07 MED ORDER — FAMOTIDINE 20 MG PO TABS: 20.0000 mg | ORAL_TABLET | Freq: Every day | ORAL | 3 refills | Status: DC

## 2019-09-07 MED ORDER — POTASSIUM CHLORIDE CRYS ER 20 MEQ PO TBCR
40.0000 meq | EXTENDED_RELEASE_TABLET | Freq: Once | ORAL | Status: AC
  Administered 2019-09-07: 40 meq via ORAL
  Filled 2019-09-07: qty 2

## 2019-09-07 MED ORDER — PANTOPRAZOLE SODIUM 40 MG IV SOLR
40.0000 mg | Freq: Once | INTRAVENOUS | Status: AC
  Administered 2019-09-08: 40 mg via INTRAVENOUS
  Filled 2019-09-07: qty 40

## 2019-09-07 NOTE — ED Notes (Signed)
Date and time results received: 09/07/19 2328 (use smartphrase ".now" to insert current time)  Test: Troponin Critical Value: 314  Name of Provider Notified: Dr Florina Ou  Orders Received? Or Actions Taken?: None

## 2019-09-07 NOTE — Telephone Encounter (Signed)
I sent you note from today visit.  ekg showed atrial fibrillation but rate controlled. Pt wife states he had this before and cardiologist aware of paroxsysmal in past. He is not on eliquis. He is on aspirin and b blocker. On review looks like there is update [place] score. I am referring him to his cardiologist at [facility]. Might take a while and getting response from [facility] difficult. Want your opinion.  On review of pt chart I don't see echocardiogram visible in care everywhere. Wanted to review in considering [place] score. considerig CHA2ds2-VASc calcucation.. htn, diabetes, older than 64 and hx of bypass. His score 5.  Consider anticoagulation pending carddiologist appointemnt?

## 2019-09-07 NOTE — Telephone Encounter (Signed)
Referral is to Dr. Sharlet Salina cardiolgoist with [facility].

## 2019-09-07 NOTE — ED Provider Notes (Signed)
[facility] Provider Note: Georgena Spurling, MD, FACEP  CSN: [id number] MRN: [id number] ARRIVAL: 09/07/19 at 2216 ROOM: [place]  Elevated Troponin   HISTORY OF PRESENT ILLNESS  09/07/19 11:28 PM Raymond Christensen is a 79 y.o. male who was recently treated with a 10-day course of cefdinir on August 19, 2019.  He also took a probiotic with this.  He tested negative for COVID-19.  He is here with about a 1 week history of epigastric discomfort.  He is having difficulty describing this but compares it to a crampiness.  It is not severe, rated about a 3 out of 10.  It is worse after eating.  It is not worse with exertion or better with rest.  He has not had any shortness of breath, diaphoresis or vomiting with this. He was seen in his PCPs office today and was found to have an elevated troponin of 514.  He has a history of atrial fibrillation but was not aware of being in atrial fibrillation at the present time.  His cardiologist is at [facility].   Past Medical History:  Diagnosis Date  . Arthritis   . CAD (coronary artery disease)   . Concussion Summer 2012  . Essential hypertension 02/26/2017  . Heart disease   . Hyperlipidemia   . Hypothyroidism 10/15/2017  . Major depressive disorder   . Obstructive sleep apnea on CPAP   . Poor flexibility of tendon 12/01/2018  . Subungual hematoma of digit of hand 12/01/2018  . Subungual hematoma of right foot 12/01/2018  . Thyroid disease   . Type 2 diabetes mellitus without complication, without long-term current use of insulin ([place]) 02/26/2017    Past Surgical History:  Procedure Laterality Date  . BYPASS GRAFT  2001  . CAROTID ARTERY ANGIOPLASTY    . TOTAL HIP ARTHROPLASTY  1994, 1995    Family History  Problem Relation Age of Onset  . Dementia Father        Concerns surrounding Lewy body dementia, but never officially diagnosed  . Cancer Neg Hx     Social History   Tobacco Use  . Smoking status: Former  Smoker    Types: Cigarettes  . Smokeless tobacco: Never Used  Vaping Use  . Vaping Use: Never used  Substance Use Topics  . Alcohol use: Yes    Alcohol/week: 3.0 standard drinks    Types: 3 Standard drinks or equivalent per week    Comment: 1 drink 3 times per week  . Drug use: No    Prior to Admission medications   Medication Sig Start Date End Date Taking? Authorizing Provider  acetaminophen-codeine 120-12 MG/5ML solution Take 5 mLs by mouth at bedtime as needed for moderate pain (cough). 08/19/19   Mosie Lukes, MD  Armodafinil 250 MG tablet Take by mouth. 03/18/18   [provider]  aspirin EC 81 MG tablet Take by mouth.    [provider]  benzonatate (TESSALON) 100 MG capsule Take 1 capsule (100 mg total) by mouth 2 (two) times daily as needed for cough. 08/19/19   Mosie Lukes, MD  chlorthalidone (HYGROTON) 25 MG tablet Take 1 tablet (25 mg total) by mouth daily. 06/03/19   [name], DO  Cholecalciferol (VITAMIN D3) 10000 units TABS Take by mouth.    [provider]  Cyanocobalamin (VITAMIN B-12 PO) Take by mouth.    [provider]  ezetimibe (ZETIA) 10 MG tablet TAKE 1 TABLET DAILY 08/20/19  [name], DO  famotidine (PEPCID) 20 MG tablet Take 1 tablet (20 mg total) by mouth daily. 09/07/19   Saguier, Percell Miller, PA-C  finasteride (PROSCAR) 5 MG tablet TAKE 1 TABLET DAILY 08/20/19   [name], DO  furosemide (LASIX) 40 MG tablet Take 1 tablet (40 mg total) by mouth daily. 04/27/19   [name], DO  levocetirizine (XYZAL) 5 MG tablet TAKE 1 TABLET EVERY EVENING 07/02/19   [name], DO  levothyroxine (SYNTHROID) 75 MCG tablet TAKE 1 TABLET DAILY 05/31/19   Wendling, Crosby Oyster, DO  liothyronine (CYTOMEL) 5 MCG tablet Take 2 tablets (10 mcg total) by mouth daily. 09/22/18   [name], DO  metoprolol succinate (TOPROL-XL) 50 MG 24 hr tablet Take by mouth. 06/10/17   [provider]  Multiple Vitamins-Minerals (MULTIVITAMIN ADULT EXTRA C PO) Take by mouth.    [provider]  omega-3 acid ethyl esters (LOVAZA) 1 g capsule Take 2 capsules (2 g total) by mouth 2 (two) times daily. 03/31/19   [name], DO  potassium chloride (KLOR-CON 10) 10 MEQ tablet Take 2 tabs with every 40 mg dose of Lasix. 04/28/19   [name], DO  ramipril (ALTACE) 10 MG capsule Take 1 capsule (10 mg total) by mouth 2 (two) times daily. 03/31/19   [name], DO  simvastatin (ZOCOR) 40 MG tablet TAKE 1 TABLET DAILY AT 6PM 05/14/19   [name], DO  tamsulosin (FLOMAX) 0.4 MG CAPS capsule TAKE 1 CAPSULE DAILY 09/06/19   [name], DO  Zinc 50 MG TABS Take by mouth.    [provider]    Allergies Sulfa antibiotics   REVIEW OF SYSTEMS  Negative except as noted here or in the History of Present Illness.   PHYSICAL EXAMINATION  Initial Vital Signs Blood pressure 131/84, pulse 98, temperature 98 F (36.7 C), temperature source Oral, resp. rate 20, height 5\' 8"  (1.727 m), weight 108.9 kg, SpO2 98 %.  Examination General: Well-developed, well-nourished male in no acute distress; appearance consistent with age of record HENT: normocephalic; atraumatic Eyes: pupils equal, round and reactive to light; extraocular muscles intact Neck: supple Heart: Regular rhythm Lungs: clear to auscultation bilaterally Abdomen: soft; nondistended; mild right upper quadrant tenderness which is not like the discomfort described in the HPI; no masses or hepatosplenomegaly; bowel sounds present Extremities: No deformity; full range of motion; pulses normal Neurologic: Awake, alert and oriented; motor function intact in all extremities and symmetric; no facial droop Skin: Warm and dry Psychiatric: Normal mood and affect   RESULTS  Summary of this visit's results, reviewed and interpreted by myself:   EKG  Interpretation  Date/Time:  Tuesday September 07 2019 22:26:05 EDT Ventricular Rate:  94 PR Interval:    QRS Duration: 90 QT Interval:  354 QTC Calculation: 442 R Axis:   57 Text Interpretation: Atrial fibrillation with premature ventricular or aberrantly conducted complexes ST & T wave abnormality, consider inferior ischemia Abnormal ECG No previous ECGs available Confirmed by Garnell Phenix, Jenny Reichmann [phone]) on 09/07/2019 10:33:56 PM      Laboratory Studies: Results for orders placed or performed during the hospital encounter of 09/07/19 (from the past 24 hour(s))  Basic metabolic panel     Status: Abnormal   Collection Time: 09/07/19 10:39 PM  Result Value Ref Range   Sodium 138 135 - 145 mmol/L   Potassium 3.2 (L) 3.5 - 5.1 mmol/L   Chloride 102 98 - 111  mmol/L   CO2 27 22 - 32 mmol/L   Glucose, Bld 148 (H) 70 - 99 mg/dL   BUN 24 (H) 8 - 23 mg/dL   Creatinine, Ser 0.92 0.61 - 1.24 mg/dL   Calcium 8.9 8.9 - 10.3 mg/dL   GFR calc non Af Amer >60 >60 mL/min   GFR calc Af Amer >60 >60 mL/min   Anion gap 9 5 - 15  CBC     Status: Abnormal   Collection Time: 09/07/19 10:39 PM  Result Value Ref Range   WBC 9.0 4.0 - 10.5 K/uL   RBC 4.62 4.22 - 5.81 MIL/uL   Hemoglobin 13.5 13.0 - 17.0 g/dL   HCT 41.2 39 - 52 %   MCV 89.2 80.0 - 100.0 fL   MCH 29.2 26.0 - 34.0 pg   MCHC 32.8 30.0 - 36.0 g/dL   RDW 13.6 11.5 - 15.5 %   Platelets 143 (L) 150 - 400 K/uL   nRBC 0.0 0.0 - 0.2 %  Differential     Status: None   Collection Time: 09/07/19 10:39 PM  Result Value Ref Range   Neutrophils Relative % 62 %   Neutro Abs 5.5 1.7 - 7.7 K/uL   Lymphocytes Relative 27 %   Lymphs Abs 2.4 0.7 - 4.0 K/uL   Monocytes Relative 10 %   Monocytes Absolute 0.9 0 - 1 K/uL   Eosinophils Relative 1 %   Eosinophils Absolute 0.1 0 - 0 K/uL   Basophils Relative 0 %   Basophils Absolute 0.0 0 - 0 K/uL   Immature Granulocytes 0 %   Abs Immature Granulocytes 0.02 0.00 - 0.07 K/uL  Hepatic function panel     Status:  Abnormal   Collection Time: 09/07/19 10:39 PM  Result Value Ref Range   Total Protein 6.9 6.5 - 8.1 g/dL   Albumin 3.8 3.5 - 5.0 g/dL   AST 31 15 - 41 U/L   ALT 34 0 - 44 U/L   Alkaline Phosphatase 54 38 - 126 U/L   Total Bilirubin <0.1 (L) 0.3 - 1.2 mg/dL   Bilirubin, Direct 0.1 0.0 - 0.2 mg/dL   Indirect Bilirubin NOT CALCULATED 0.3 - 0.9 mg/dL  Lipase, blood     Status: None   Collection Time: 09/07/19 10:39 PM  Result Value Ref Range   Lipase 26 11 - 51 U/L  Troponin I (High Sensitivity)     Status: Abnormal   Collection Time: 09/07/19 10:40 PM  Result Value Ref Range   Troponin I (High Sensitivity) 314 (HH) <18 ng/L  Urinalysis, Routine w reflex microscopic Urine, Clean Catch     Status: None   Collection Time: 09/07/19 11:17 PM  Result Value Ref Range   Color, Urine YELLOW YELLOW   APPearance CLEAR CLEAR   Specific Gravity, Urine 1.025 1.005 - 1.030   pH 5.5 5.0 - 8.0   Glucose, UA NEGATIVE NEGATIVE mg/dL   Hgb urine dipstick NEGATIVE NEGATIVE   Bilirubin Urine NEGATIVE NEGATIVE   Ketones, ur NEGATIVE NEGATIVE mg/dL   Protein, ur NEGATIVE NEGATIVE mg/dL   Nitrite NEGATIVE NEGATIVE   Leukocytes,Ua NEGATIVE NEGATIVE  Troponin I (High Sensitivity)     Status: Abnormal   Collection Time: 09/08/19 12:42 AM  Result Value Ref Range   Troponin I (High Sensitivity) 304 (HH) <18 ng/L  SARS Coronavirus 2 by RT PCR (hospital order, performed in [facility] hospital lab) Nasopharyngeal Nasopharyngeal Swab     Status: None   Collection Time:  09/08/19  3:20 AM   Specimen: Nasopharyngeal Swab  Result Value Ref Range   SARS Coronavirus 2 NEGATIVE NEGATIVE   Imaging Studies: DG Chest 2 View  Result Date: 09/07/2019 CLINICAL DATA:  Cough EXAM: CHEST - 2 VIEW COMPARISON:  None. FINDINGS: Cardiac shadow is enlarged. Postsurgical changes are noted. The lungs are clear. Degenerative changes of the thoracic spine are noted. IMPRESSION: No active cardiopulmonary disease. Electronically  Signed   By: Inez Catalina M.D.   On: 09/07/2019 17:32    ED COURSE and MDM  Nursing notes, initial and subsequent vitals signs, including pulse oximetry, reviewed and interpreted by myself.  Vitals:   09/08/19 0400 09/08/19 0433 09/08/19 0500 09/08/19 0530  BP: 113/69 110/65 117/68 123/76  Pulse: 77 84 76 81  Resp: 17 19 12 17   Temp:      TempSrc:      SpO2: 97% 97% 94% 92%  Weight:      Height:       Medications  nitroGLYCERIN (NITROSTAT) SL tablet 0.4 mg (0.4 mg Sublingual Given 09/08/19 0110)  heparin ADULT infusion 100 units/mL (25000 units/215mL sodium chloride 0.45%) (1,300 Units/hr Intravenous New Bag/Given 09/08/19 0317)  potassium chloride SA (KLOR-CON) CR tablet 40 mEq (40 mEq Oral Given 09/07/19 2358)  pantoprazole (PROTONIX) injection 40 mg (40 mg Intravenous Given 09/08/19 0000)  heparin bolus via infusion 4,000 Units (4,000 Units Intravenous Bolus from Bag 09/08/19 0317)   2:49 AM Patient has developed some epigastric discomfort earlier which resolved with 3 sublingual nitroglycerin.  We will start heparin for non-STEMI and atrial fibrillation.  3:16 AM Dr. Myna Hidalgo will admit patient to the hospitalist service.  Dr. Kalman Shan of cardiology consulted.   PROCEDURES  Procedures  CRITICAL CARE Performed by: Karen Chafe Melissa Tomaselli Total critical care time: 30 minutes Critical care time was exclusive of separately billable procedures and treating other patients. Critical care was necessary to treat or prevent imminent or life-threatening deterioration. Critical care was time spent personally by me on the following activities: development of treatment plan with patient and/or surrogate as well as nursing, discussions with consultants, evaluation of patient's response to treatment, examination of patient, obtaining history from patient or surrogate, ordering and performing treatments and interventions, ordering and review of laboratory studies, ordering and review of radiographic studies,  pulse oximetry and re-evaluation of patient's condition.   ED DIAGNOSES     ICD-10-CM   1. Non-STEMI (non-ST elevated myocardial infarction) ([place])  I21.4   2. Paroxysmal atrial fibrillation ([place])  I48.0        Larnell Granlund, MD 09/08/19 [phone]

## 2019-09-07 NOTE — Progress Notes (Addendum)
Subjective:    Patient ID: Raymond Christensen, male    DOB: Feb 06, 1940, 79 y.o.   MRN: [id number]  HPI Pt in for some recent abdomen region pain. States epigastric discomfort at times. He is belching intermittently. Pt states symptoms appear to start after bronchitis illness and antibiotic course. He was given tesalon, mucinex and cefdnir. Around the time he was sick early in the month he had a covid test done and it was negative.   He describes pain in upper stomach after eating as tight feeling. He describes pain as wrapping around toward his back. Pt is taking probiotic. He questions whether that may have bothered his stomach.No nausea,no vomiting. No sour taste in mouth. Belches on and off throughout. No meds for stomach used.  He also notes some fatigue.  Rare occasional cough.  No sob, no chest pain. No arm pain or any jaw pain.  No palpitation or tachycardia.     Review of Systems  Constitutional: Positive for fatigue. Negative for appetite change.  HENT: Negative for dental problem.   Respiratory: Positive for cough. Negative for chest tightness, shortness of breath and wheezing.   Cardiovascular: Negative for chest pain and palpitations.  Gastrointestinal: Negative for diarrhea.       Abdomen region pain/lower rib area pain  Genitourinary: Negative for flank pain.  Musculoskeletal: Negative for back pain, myalgias and neck pain.  Skin: Negative for rash.  Psychiatric/Behavioral: Negative for behavioral problems, confusion and dysphoric mood. The patient is not nervous/anxious.     Past Medical History:  Diagnosis Date  . Arthritis   . CAD (coronary artery disease)   . Concussion Summer 2012  . Essential hypertension 02/26/2017  . Heart disease   . Hyperlipidemia   . Hypothyroidism 10/15/2017  . Major depressive disorder   . Obstructive sleep apnea on CPAP   . Poor flexibility of tendon 12/01/2018  . Subungual hematoma of digit of hand 12/01/2018  . Subungual hematoma  of right foot 12/01/2018  . Thyroid disease   . Type 2 diabetes mellitus without complication, without long-term current use of insulin ([place]) 02/26/2017     Social History   Socioeconomic History  . Marital status: Married    Spouse name: Not on file  . Number of children: Not on file  . Years of education: 22  . Highest education level: Doctorate  Occupational History  . Not on file  Tobacco Use  . Smoking status: Former Smoker    Types: Cigarettes  . Smokeless tobacco: Never Used  Vaping Use  . Vaping Use: Never used  Substance and Sexual Activity  . Alcohol use: Yes    Alcohol/week: 3.0 standard drinks    Types: 3 Standard drinks or equivalent per week    Comment: 1 drink 3 times per week  . Drug use: No  . Sexual activity: Yes  Other Topics Concern  . 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 Difficulty of Paying Living Expenses: Not hard at all  Food Insecurity:   . Worried About Charity fundraiser in the Last Year: Not on file  . Ran Out of Food in the Last Year: Not on file  Transportation  Needs:   . Lack of Transportation (Medical): Not on file  . Lack of Transportation (Non-Medical): Not on file  Physical Activity:   . Days of Exercise per Week: Not on file  . Minutes of Exercise per Session: Not on file  Stress:   . Feeling of Stress : Not on file  Social Connections:   . Frequency of Communication with Friends and Family: Not on file  . Frequency of Social Gatherings with Friends and Family: Not on file  . Attends Religious Services: Not on file  . Active Member of Clubs or Organizations: Not on file  . Attends Archivist Meetings: Not on file  . Marital Status: Not on file  Intimate Partner Violence:   . Fear  of Current or Ex-Partner: Not on file  . Emotionally Abused: Not on file  . Physically Abused: Not on file  . Sexually Abused: Not on file    Past Surgical History:  Procedure Laterality Date  . BYPASS GRAFT  2001  . CAROTID ARTERY ANGIOPLASTY    . TOTAL HIP ARTHROPLASTY  1994, 1995    Family History  Problem Relation Age of Onset  . Dementia Father        Concerns surrounding Lewy body dementia, but never officially diagnosed  . Cancer Neg Hx     Allergies  Allergen Reactions  . Sulfa Antibiotics Rash    Questionable, he developed a diffuse rash 2 days after stopping Bactrim    Current Outpatient Medications on File Prior to Visit  Medication Sig Dispense Refill  . acetaminophen-codeine 120-12 MG/5ML solution Take 5 mLs by mouth at bedtime as needed for moderate pain (cough). 120 mL 0  . Armodafinil 250 MG tablet Take by mouth.    Marland Kitchen aspirin EC 81 MG tablet Take by mouth.    . benzonatate (TESSALON) 100 MG capsule Take 1 capsule (100 mg total) by mouth 2 (two) times daily as needed for cough. 60 capsule 0  . chlorthalidone (HYGROTON) 25 MG tablet Take 1 tablet (25 mg total) by mouth daily. 90 tablet 0  . Cholecalciferol (VITAMIN D3) 10000 units TABS Take by mouth.    . Cyanocobalamin (VITAMIN B-12 PO) Take by mouth.    . ezetimibe (ZETIA) 10 MG tablet TAKE 1 TABLET DAILY 90 tablet 1  . finasteride (PROSCAR) 5 MG tablet TAKE 1 TABLET DAILY 90 tablet 1  . furosemide (LASIX) 40 MG tablet Take 1 tablet (40 mg total) by mouth daily. 30 tablet 3  . levocetirizine (XYZAL) 5 MG tablet TAKE 1 TABLET EVERY EVENING 90 tablet 1  . levothyroxine (SYNTHROID) 75 MCG tablet TAKE 1 TABLET DAILY 90 tablet 1  . liothyronine (CYTOMEL) 5 MCG tablet Take 2 tablets (10 mcg total) by mouth daily. 180 tablet 3  . metoprolol succinate (TOPROL-XL) 50 MG 24 hr tablet Take by mouth.    . Multiple Vitamins-Minerals (MULTIVITAMIN ADULT EXTRA C PO) Take by mouth.    . omega-3 acid ethyl esters (LOVAZA) 1  g capsule Take 2 capsules (2 g total) by mouth 2 (two) times daily. 360 capsule 2  . potassium chloride (KLOR-CON 10) 10 MEQ tablet Take 2 tabs with every 40 mg dose of Lasix. 180 tablet 1  . ramipril (ALTACE) 10 MG capsule Take 1 capsule (10 mg total) by mouth 2 (two) times daily. 180 capsule 3  . simvastatin (ZOCOR) 40 MG tablet TAKE 1 TABLET DAILY AT 6PM 90 tablet 3  . tamsulosin (FLOMAX) 0.4 MG CAPS capsule  TAKE 1 CAPSULE DAILY 90 capsule 3  . Zinc 50 MG TABS Take by mouth.     No current facility-administered medications on file prior to visit.    BP 130/78 (BP Location: Left Arm, Patient Position: Sitting, Cuff Size: Large)   Pulse (!) 106   Temp 98.3 F (36.8 C) (Oral)   Resp 18   Wt 241 lb 1.6 oz (109.4 kg)   SpO2 99%   BMI 36.66 kg/m       Objective:   Physical Exam  General Mental Status- Alert. General Appearance- Not in acute distress.   Skin General: Color- Normal Color. Moisture- Normal Moisture.  Neck Carotid Arteries- Normal color. Moisture- Normal Moisture. No carotid bruits. No JVD.  Chest and Lung Exam Auscultation: Breath Sounds:-Normal.  Cardiovascular Auscultation:Rythm- Regular. Murmurs & Other Heart Sounds:Auscultation of the heart reveals- No Murmurs.  Abdomen Inspection:-Inspeection Normal. Palpation/Percussion:Note:No mass. Palpation and Percussion of the abdomen reveal- Non Tender, Non Distended + BS, no rebound or guarding.   Neurologic Cranial Nerve exam:- CN III-XII intact(No nystagmus), symmetric smile. Strength:- 5/5 equal and symmetric strength Christensen upper and lower extremities.      Assessment & Plan:  Patient did have atrial fibrillation on EKG today and it was rate controlled.  I looked through his chart and saw that he did have brief atrial fibrillation after CABG surgery years ago.  But I was having a hard time finding any history of it intermittently in the past.  Patient talk to his wife today who distinctly remembers that  patient has had various occasional epidsodes with  paroxysmal atrial fibrillation that seems to be always rate controlled and no  medication has been given.  Patient states per wife the cardiologist did not make a big deal about this.  Before knowing this I was considering referring patient to emergency department for work-up which would have included possible to sets of troponin and cardiac monitor.  Instead of sending downstairs at this decided to go with the 1 set of troponin that ordered stat.  I did advise patient that if he has any palpitations, tachycardia any chest pain or shortness of breath then he would need to be seen emergency department tonight.  Patient agreed with plan.  For recent atypical upper abdomen pain will get cbc, cmp and lipase.  Will rx famotadine to see if this helps with your upper abdomen discomfort.  Will get cxr today since you report cough since bronchitis illness and upper abdomen pain atypical. Will make sure you don't have walking pneumonia.  For fatigue included b12, b1, and iron level in labs. .   Follow up in 7 days or as needed  On review of pt chart I don't see echocardiogram visible in care everywhere. Wanted to review in considering [place] score. considerig CHA2ds2-VASc calucation.. htn, diabetes, older than 19 and hx of bypass. Score 5. Will refer back to his cardiologist. Will also send copy of note to pt pcp. Consider anticoagulation?   Mackie Pai, PA-C   Time spent with patient today was  45+ minutes which consisted of chart rediew, discussing diagnosis, work up, treatment  Discussing with Dr. Etter Sjogren and documentation.

## 2019-09-07 NOTE — ED Triage Notes (Signed)
Abdominal pain for a week. He was seen at his MDs office today. The office called him tonight and told him his Troponin was elevated and he needed to come to the ED.

## 2019-09-07 NOTE — Patient Instructions (Addendum)
Patient did have atrial fibrillation on EKG today and it was rate controlled.  I looked through his chart and saw that he did have brief atrial fibrillation after CABG surgery years ago.  But I was having a hard time finding any history of it intermittently in the past.  Patient talk to his wife today who distinctly remembers that patient has had various occasional epidsodes with  paroxysmal atrial fibrillation that seems to be always rate controlled(hx of b blocker) and no medication has been given.  Patient states per wife the cardiologist did not make a big deal about this.  Before knowing this I was considering referring patient to emergency department for work-up which would have included possible to sets of troponin and cardiac monitor.  Instead of sending downstairs at this decided to go with the 1 set of troponin that ordered stat.  I did advise patient that if he has any palpitations, tachycardia any chest pain or shortness of breath then he would need to be seen emergency department tonight.  Patient agreed with plan.  For recent atypical upper abdomen pain will get cbc, cmp and lipase.  Will rx famotadine to see if this helps with your upper abdomen discomfort.  Will get cxr today since you report cough since bronchitis illness and upper abdomen pain atypical. Will make sure you don't have walking pneumonia.  For fatigue included b12, b1, and iron level in labs. .   Follow up in 7 days or as needed

## 2019-09-08 ENCOUNTER — Observation Stay (HOSPITAL_BASED_OUTPATIENT_CLINIC_OR_DEPARTMENT_OTHER): Payer: Medicare (Managed Care)

## 2019-09-08 DIAGNOSIS — I25708 Atherosclerosis of coronary artery bypass graft(s), unspecified, with other forms of angina pectoris: Secondary | ICD-10-CM

## 2019-09-08 DIAGNOSIS — I25709 Atherosclerosis of coronary artery bypass graft(s), unspecified, with unspecified angina pectoris: Secondary | ICD-10-CM | POA: Diagnosis not present

## 2019-09-08 DIAGNOSIS — I2510 Atherosclerotic heart disease of native coronary artery without angina pectoris: Secondary | ICD-10-CM | POA: Diagnosis present

## 2019-09-08 DIAGNOSIS — Z7982 Long term (current) use of aspirin: Secondary | ICD-10-CM | POA: Diagnosis not present

## 2019-09-08 DIAGNOSIS — Z6836 Body mass index (BMI) 36.0-36.9, adult: Secondary | ICD-10-CM | POA: Diagnosis not present

## 2019-09-08 DIAGNOSIS — I5043 Acute on chronic combined systolic (congestive) and diastolic (congestive) heart failure: Secondary | ICD-10-CM | POA: Diagnosis present

## 2019-09-08 DIAGNOSIS — I361 Nonrheumatic tricuspid (valve) insufficiency: Secondary | ICD-10-CM | POA: Diagnosis not present

## 2019-09-08 DIAGNOSIS — I252 Old myocardial infarction: Secondary | ICD-10-CM | POA: Diagnosis not present

## 2019-09-08 DIAGNOSIS — I4819 Other persistent atrial fibrillation: Secondary | ICD-10-CM

## 2019-09-08 DIAGNOSIS — I4891 Unspecified atrial fibrillation: Secondary | ICD-10-CM | POA: Diagnosis not present

## 2019-09-08 DIAGNOSIS — E876 Hypokalemia: Secondary | ICD-10-CM | POA: Diagnosis present

## 2019-09-08 DIAGNOSIS — E1169 Type 2 diabetes mellitus with other specified complication: Secondary | ICD-10-CM | POA: Diagnosis not present

## 2019-09-08 DIAGNOSIS — I340 Nonrheumatic mitral (valve) insufficiency: Secondary | ICD-10-CM

## 2019-09-08 DIAGNOSIS — N400 Enlarged prostate without lower urinary tract symptoms: Secondary | ICD-10-CM | POA: Diagnosis present

## 2019-09-08 DIAGNOSIS — I200 Unstable angina: Secondary | ICD-10-CM | POA: Diagnosis not present

## 2019-09-08 DIAGNOSIS — M10072 Idiopathic gout, left ankle and foot: Secondary | ICD-10-CM | POA: Diagnosis present

## 2019-09-08 DIAGNOSIS — Z20822 Contact with and (suspected) exposure to covid-19: Secondary | ICD-10-CM | POA: Diagnosis present

## 2019-09-08 DIAGNOSIS — E039 Hypothyroidism, unspecified: Secondary | ICD-10-CM

## 2019-09-08 DIAGNOSIS — I25810 Atherosclerosis of coronary artery bypass graft(s) without angina pectoris: Secondary | ICD-10-CM | POA: Diagnosis present

## 2019-09-08 DIAGNOSIS — E785 Hyperlipidemia, unspecified: Secondary | ICD-10-CM | POA: Diagnosis present

## 2019-09-08 DIAGNOSIS — E669 Obesity, unspecified: Secondary | ICD-10-CM

## 2019-09-08 DIAGNOSIS — R778 Other specified abnormalities of plasma proteins: Secondary | ICD-10-CM | POA: Diagnosis not present

## 2019-09-08 DIAGNOSIS — I110 Hypertensive heart disease with heart failure: Secondary | ICD-10-CM | POA: Diagnosis present

## 2019-09-08 DIAGNOSIS — D696 Thrombocytopenia, unspecified: Secondary | ICD-10-CM | POA: Diagnosis present

## 2019-09-08 DIAGNOSIS — E119 Type 2 diabetes mellitus without complications: Secondary | ICD-10-CM | POA: Diagnosis present

## 2019-09-08 DIAGNOSIS — M1990 Unspecified osteoarthritis, unspecified site: Secondary | ICD-10-CM | POA: Diagnosis present

## 2019-09-08 DIAGNOSIS — Z87891 Personal history of nicotine dependence: Secondary | ICD-10-CM | POA: Diagnosis not present

## 2019-09-08 DIAGNOSIS — G4733 Obstructive sleep apnea (adult) (pediatric): Secondary | ICD-10-CM | POA: Diagnosis present

## 2019-09-08 DIAGNOSIS — I513 Intracardiac thrombosis, not elsewhere classified: Secondary | ICD-10-CM | POA: Diagnosis present

## 2019-09-08 DIAGNOSIS — I25110 Atherosclerotic heart disease of native coronary artery with unstable angina pectoris: Secondary | ICD-10-CM | POA: Diagnosis present

## 2019-09-08 DIAGNOSIS — I480 Paroxysmal atrial fibrillation: Secondary | ICD-10-CM

## 2019-09-08 DIAGNOSIS — I25700 Atherosclerosis of coronary artery bypass graft(s), unspecified, with unstable angina pectoris: Secondary | ICD-10-CM | POA: Diagnosis not present

## 2019-09-08 DIAGNOSIS — I214 Non-ST elevation (NSTEMI) myocardial infarction: Secondary | ICD-10-CM | POA: Diagnosis present

## 2019-09-08 DIAGNOSIS — I5022 Chronic systolic (congestive) heart failure: Secondary | ICD-10-CM | POA: Diagnosis not present

## 2019-09-08 DIAGNOSIS — Z79899 Other long term (current) drug therapy: Secondary | ICD-10-CM | POA: Diagnosis not present

## 2019-09-08 LAB — BASIC METABOLIC PANEL
Anion gap: 10 (ref 5–15)
BUN: 16 mg/dL (ref 8–23)
CO2: 27 mmol/L (ref 22–32)
Calcium: 9.3 mg/dL (ref 8.9–10.3)
Chloride: 103 mmol/L (ref 98–111)
Creatinine, Ser: 0.92 mg/dL (ref 0.61–1.24)
GFR calc Af Amer: >60 mL/min (ref >60)
GFR calc non Af Amer: >60 mL/min (ref >60)
Glucose, Bld: 133 mg/dL — ABNORMAL HIGH (ref 70–99)
Potassium: 3.8 mmol/L (ref 3.5–5.1)
Sodium: 140 mmol/L (ref 135–145)

## 2019-09-08 LAB — ECHOCARDIOGRAM COMPLETE
Height: 68 in
MV M vel: 4.95 m/s
MV Peak grad: 97.8 mmHg
S' Lateral: 3.36 cm
Weight: 3816.6 oz

## 2019-09-08 LAB — CBC
HCT: 42.2 % (ref 39.0–52.0)
Hemoglobin: 13.6 g/dL (ref 13.0–17.0)
MCH: 28.8 pg (ref 26.0–34.0)
MCHC: 32.2 g/dL (ref 30.0–36.0)
MCV: 89.4 fL (ref 80.0–100.0)
Platelets: 142 10*3/uL — ABNORMAL LOW (ref 150–400)
RBC: 4.72 MIL/uL (ref 4.22–5.81)
RDW: 13.6 % (ref 11.5–15.5)
WBC: 9.4 10*3/uL (ref 4.0–10.5)
nRBC: 0 % (ref 0.0–0.2)

## 2019-09-08 LAB — DIFFERENTIAL
Abs Immature Granulocytes: 0.02 10*3/uL (ref 0.00–0.07)
Basophils Absolute: 0 10*3/uL (ref 0.0–0.1)
Basophils Relative: 0 %
Eosinophils Absolute: 0.1 10*3/uL (ref 0.0–0.5)
Eosinophils Relative: 1 %
Immature Granulocytes: 0 %
Lymphocytes Relative: 27 %
Lymphs Abs: 2.4 10*3/uL (ref 0.7–4.0)
Monocytes Absolute: 0.9 10*3/uL (ref 0.1–1.0)
Monocytes Relative: 10 %
Neutro Abs: 5.5 10*3/uL (ref 1.7–7.7)
Neutrophils Relative %: 62 %

## 2019-09-08 LAB — TROPONIN I (HIGH SENSITIVITY): Troponin I (High Sensitivity): 304 ng/L (ref <18)

## 2019-09-08 LAB — LIPID PANEL
Cholesterol: 135 mg/dL (ref 0–200)
HDL: 42 mg/dL (ref >40)
LDL Cholesterol: 76 mg/dL (ref 0–99)
Total CHOL/HDL Ratio: 3.2 RATIO
Triglycerides: 85 mg/dL (ref <150)
VLDL: 17 mg/dL (ref 0–40)

## 2019-09-08 LAB — HEPATIC FUNCTION PANEL
ALT: 34 U/L (ref 0–44)
AST: 31 U/L (ref 15–41)
Albumin: 3.8 g/dL (ref 3.5–5.0)
Alkaline Phosphatase: 54 U/L (ref 38–126)
Bilirubin, Direct: 0.1 mg/dL (ref 0.0–0.2)
Total Bilirubin: <0.1 mg/dL — ABNORMAL LOW (ref 0.3–1.2)
Total Protein: 6.9 g/dL (ref 6.5–8.1)

## 2019-09-08 LAB — LIPASE, BLOOD: Lipase: 26 U/L (ref 11–51)

## 2019-09-08 LAB — HEPARIN LEVEL (UNFRACTIONATED)
Heparin Unfractionated: 0.32 IU/mL (ref 0.30–0.70)
Heparin Unfractionated: 0.34 IU/mL (ref 0.30–0.70)

## 2019-09-08 LAB — BRAIN NATRIURETIC PEPTIDE: B Natriuretic Peptide: 264.7 pg/mL — ABNORMAL HIGH (ref 0.0–100.0)

## 2019-09-08 LAB — SARS CORONAVIRUS 2 BY RT PCR (HOSPITAL ORDER, PERFORMED IN ~~LOC~~ HOSPITAL LAB): SARS Coronavirus 2: NEGATIVE

## 2019-09-08 LAB — GLUCOSE, CAPILLARY: Glucose-Capillary: 134 mg/dL — ABNORMAL HIGH (ref 70–99)

## 2019-09-08 MED ORDER — SODIUM CHLORIDE 0.9% FLUSH
3.0000 mL | Freq: Two times a day (BID) | INTRAVENOUS | Status: DC
  Administered 2019-09-08: 3 mL via INTRAVENOUS

## 2019-09-08 MED ORDER — SODIUM CHLORIDE 0.9% FLUSH: 3.0000 mL | INTRAVENOUS | Status: DC | PRN

## 2019-09-08 MED ORDER — LIOTHYRONINE SODIUM 5 MCG PO TABS
10.0000 ug | ORAL_TABLET | Freq: Every day | ORAL | Status: DC
  Administered 2019-09-08 – 2019-09-14 (×6): 10 ug via ORAL
  Filled 2019-09-08 (×7): qty 2

## 2019-09-08 MED ORDER — SIMVASTATIN 20 MG PO TABS
40.0000 mg | ORAL_TABLET | Freq: Every day | ORAL | Status: DC
  Administered 2019-09-08 – 2019-09-09 (×2): 40 mg via ORAL
  Filled 2019-09-08 (×2): qty 2

## 2019-09-08 MED ORDER — ONDANSETRON HCL 4 MG/2ML IJ SOLN: 4.0000 mg | Freq: Four times a day (QID) | INTRAMUSCULAR | Status: DC | PRN

## 2019-09-08 MED ORDER — TAMSULOSIN HCL 0.4 MG PO CAPS
0.4000 mg | ORAL_CAPSULE | Freq: Every day | ORAL | Status: DC
  Administered 2019-09-08 – 2019-09-13 (×6): 0.4 mg via ORAL
  Filled 2019-09-08 (×6): qty 1

## 2019-09-08 MED ORDER — HEPARIN BOLUS VIA INFUSION
4000.0000 [IU] | Freq: Once | INTRAVENOUS | Status: AC
  Administered 2019-09-08: 4000 [IU] via INTRAVENOUS

## 2019-09-08 MED ORDER — LEVOCETIRIZINE DIHYDROCHLORIDE 5 MG PO TABS: 5.0000 mg | ORAL_TABLET | Freq: Every evening | ORAL | Status: DC

## 2019-09-08 MED ORDER — FLUTICASONE PROPIONATE 50 MCG/ACT NA SUSP
2.0000 | Freq: Every day | NASAL | Status: DC | PRN
  Filled 2019-09-08: qty 16

## 2019-09-08 MED ORDER — ASPIRIN EC 81 MG PO TBEC
81.0000 mg | DELAYED_RELEASE_TABLET | Freq: Every day | ORAL | Status: DC
  Administered 2019-09-08 – 2019-09-13 (×6): 81 mg via ORAL
  Filled 2019-09-08 (×6): qty 1

## 2019-09-08 MED ORDER — POTASSIUM CHLORIDE CRYS ER 20 MEQ PO TBCR
40.0000 meq | EXTENDED_RELEASE_TABLET | ORAL | Status: DC
  Filled 2019-09-08: qty 2

## 2019-09-08 MED ORDER — HEPARIN (PORCINE) 25000 UT/250ML-% IV SOLN
1400.0000 [IU]/h | INTRAVENOUS | Status: DC
  Administered 2019-09-08: 1400 [IU]/h via INTRAVENOUS
  Administered 2019-09-08: 1300 [IU]/h via INTRAVENOUS
  Filled 2019-09-08 (×2): qty 250

## 2019-09-08 MED ORDER — FINASTERIDE 5 MG PO TABS
5.0000 mg | ORAL_TABLET | Freq: Every day | ORAL | Status: DC
  Administered 2019-09-08 – 2019-09-14 (×6): 5 mg via ORAL
  Filled 2019-09-08 (×7): qty 1

## 2019-09-08 MED ORDER — ACETAMINOPHEN 325 MG PO TABS
650.0000 mg | ORAL_TABLET | ORAL | Status: DC | PRN
  Administered 2019-09-12: 650 mg via ORAL
  Filled 2019-09-08: qty 2

## 2019-09-08 MED ORDER — ASPIRIN 81 MG PO CHEW
81.0000 mg | CHEWABLE_TABLET | ORAL | Status: AC
  Administered 2019-09-09: 81 mg via ORAL
  Filled 2019-09-08: qty 1

## 2019-09-08 MED ORDER — LEVOTHYROXINE SODIUM 75 MCG PO TABS
75.0000 ug | ORAL_TABLET | Freq: Every day | ORAL | Status: DC
  Administered 2019-09-09 – 2019-09-14 (×6): 75 ug via ORAL
  Filled 2019-09-08 (×6): qty 1

## 2019-09-08 MED ORDER — NITROGLYCERIN 0.4 MG SL SUBL
0.4000 mg | SUBLINGUAL_TABLET | SUBLINGUAL | Status: DC | PRN
  Administered 2019-09-08 (×3): 0.4 mg via SUBLINGUAL
  Filled 2019-09-08: qty 1

## 2019-09-08 MED ORDER — LORATADINE 10 MG PO TABS
10.0000 mg | ORAL_TABLET | Freq: Every day | ORAL | Status: DC
  Administered 2019-09-08 – 2019-09-14 (×5): 10 mg via ORAL
  Filled 2019-09-08 (×7): qty 1

## 2019-09-08 MED ORDER — CHLORTHALIDONE 25 MG PO TABS: 25.0000 mg | ORAL_TABLET | Freq: Every day | ORAL | Status: DC

## 2019-09-08 MED ORDER — SODIUM CHLORIDE 0.9 % IV SOLN: 250.0000 mL | INTRAVENOUS | Status: DC | PRN

## 2019-09-08 MED ORDER — SODIUM CHLORIDE 0.9 % IV SOLN: INTRAVENOUS | Status: DC

## 2019-09-08 MED ORDER — PERFLUTREN LIPID MICROSPHERE
1.0000 mL | INTRAVENOUS | Status: AC | PRN
  Administered 2019-09-08: 3 mL via INTRAVENOUS
  Filled 2019-09-08: qty 10

## 2019-09-08 MED ORDER — EZETIMIBE 10 MG PO TABS
10.0000 mg | ORAL_TABLET | Freq: Every day | ORAL | Status: DC
  Administered 2019-09-08 – 2019-09-14 (×6): 10 mg via ORAL
  Filled 2019-09-08 (×7): qty 1

## 2019-09-08 NOTE — Telephone Encounter (Signed)
Pt already has an appointment scheduled 10/19/19 with Dr. Melanee Spry Cardiology. If pt needs sooner appt please advise.

## 2019-09-08 NOTE — Telephone Encounter (Signed)
Ok. Will see how it goes. In hospital now for MI.

## 2019-09-08 NOTE — Plan of Care (Signed)

## 2019-09-08 NOTE — Progress Notes (Signed)
ANTICOAGULATION CONSULT NOTE - Follow Up Consult  Pharmacy Consult for IV Heparin Indication: chest pain/ACS  Allergies  Allergen Reactions  . Sulfa Antibiotics Rash    Questionable, he developed a diffuse rash 2 days after stopping Bactrim    Patient Measurements: Height: 5\' 8"  (172.7 cm) Weight: 108.2 kg (238 lb 8.6 oz) (scale a) IBW/kg (Calculated) : 68.4 Heparin Dosing Weight: 92 kg  Vital Signs: Temp: 98 F (36.7 C) (08/25 1056) Temp Source: Oral (08/25 1056) BP: 100/85 (08/25 1056) Pulse Rate: 82 (08/25 1056)  Labs: Recent Labs    09/07/19 1621 09/07/19 1621 09/07/19 2239 09/07/19 2240 09/08/19 0042 09/08/19 1254  HGB 13.5   < > 13.5  --   --  13.6  HCT 40.6  --  41.2  --   --  42.2  PLT 150  --  143*  --   --  142*  HEPARINUNFRC  --   --   --   --   --  0.32  CREATININE  --   --  0.92  --   --  0.92  TROPONINIHS  --   --   --  314* 304*  --    < > = values in this interval not displayed.    Estimated Creatinine Clearance: 80.2 mL/min (by C-G formula based on SCr of 0.92 mg/dL).  Assessment: 79 year old male on IV Heparin for ACS.   Initial heparin level is at goal (0.32) on 1300 units/hr - but at low end of range. CBC is stable -Platelets are in 140's so will monitor closely. No bleeding noted.  Will increase IV Heparin to keep in range and recheck level in 8 hours.   Goal of Therapy:  Heparin level 0.3-0.7 units/ml Monitor platelets by anticoagulation protocol: Yes   Plan:  Increase IV Heparin to 1400 units/hr.  Recheck heparin level in 8 hours to confirm.  Daily Heparin level and CBC while on therapy.   Sloan Leiter, PharmD, BCPS, BCCCP Clinical Pharmacist Please refer to [facility] for [place] numbers 09/08/2019,1:51 PM

## 2019-09-08 NOTE — Telephone Encounter (Signed)
Pt admitted

## 2019-09-08 NOTE — Progress Notes (Signed)
°  Echocardiogram 2D Echocardiogram has been performed.  Jennette Dubin 09/08/2019, 1:52 PM

## 2019-09-08 NOTE — ED Notes (Signed)
Pt reports chest discomfort; EDP [place] informed

## 2019-09-08 NOTE — H&P (Addendum)
History and Physical    Raymond Christensen JWJ:[id number] DOB: 1940-10-11 DOA: 09/07/2019  Referring MD/NP/PA: Mitzi Hansen, MD PCP: [name], DO  Patient coming from: [facility] transfer  Chief Complaint: Abdominal pain  I have personally briefly reviewed patient's old medical records in [place]   HPI: Raymond Christensen is a 79 y.o. male with medical history significant of hypertension, hyperlipidemia, hypothyroidism, diet-controlled diabetes mellitus type 2, CAD, s/p left carotid artery endarterectomy, obesity, and OSA on CPAP presented with complaints of epigastric abdominal pain over the last week.  He describes it as a dull pain that sometimes will sometimes radiate to his back.  Noted associated symptoms of increased fatigue and intermittent lower extremity swelling for which he is on furosemide as needed.  Denies really having any significant change in weight, shortness of breath, chest pain, nausea, vomiting, diaphoresis, or orthopnea symptoms.  Him and his wife had recently gotten over a viral/bacterial infection a week or 2 prior to onset of his symptoms that was treated with antibiotics.  While on antibiotics he had also been on probiotics and questioned if symptoms were secondary to the probiotics.    ED Course: Upon admission into the emergency department patient was seen to be tachycardic and tachypneic with all other vital signs maintained.  Labs from 8/24 were significant for platelets 143, potassium 3.2, troponin I 514, and high-sensitivity troponin 314-> 304.  Patient had been placed on a heparin drip and cardiology formally consulted.  TRH called to admit.  Review of Systems  Constitutional: Positive for malaise/fatigue. Negative for fever.  HENT: Positive for congestion. Negative for nosebleeds.   Eyes: Negative for photophobia and pain.  Respiratory: Negative for cough and wheezing.   Cardiovascular: Positive for leg swelling. Negative for chest pain.    Gastrointestinal: Positive for abdominal pain and heartburn. Negative for diarrhea and vomiting.  Genitourinary: Negative for dysuria and hematuria.  Musculoskeletal: Negative for joint pain and myalgias.  Neurological: Negative for focal weakness and loss of consciousness.  Psychiatric/Behavioral: Negative for memory loss and substance abuse.    Past Medical History:  Diagnosis Date  . Arthritis   . CAD (coronary artery disease)   . Concussion Summer 2012  . Essential hypertension 02/26/2017  . Heart disease   . Hyperlipidemia   . Hypothyroidism 10/15/2017  . Major depressive disorder   . Obstructive sleep apnea on CPAP   . Poor flexibility of tendon 12/01/2018  . Subungual hematoma of digit of hand 12/01/2018  . Subungual hematoma of right foot 12/01/2018  . Thyroid disease   . Type 2 diabetes mellitus without complication, without long-term current use of insulin ([place]) 02/26/2017    Past Surgical History:  Procedure Laterality Date  . BYPASS GRAFT  2001  . CAROTID ARTERY ANGIOPLASTY    . TOTAL HIP ARTHROPLASTY  1994, 1995     reports that he has quit smoking. His smoking use included cigarettes. He has never used smokeless tobacco. He reports current alcohol use of about 3.0 standard drinks of alcohol per week. He reports that he does not use drugs.  Allergies  Allergen Reactions  . Sulfa Antibiotics Rash    Questionable, he developed a diffuse rash 2 days after stopping Bactrim    Family History  Problem Relation Age of Onset  . Dementia Father        Concerns surrounding Lewy body dementia, but never officially diagnosed  . Cancer Neg Hx     Prior to Admission medications   Medication  Sig Start Date End Date Taking? Authorizing Provider  Armodafinil 250 MG tablet Take 250 mg by mouth every morning.  03/18/18  Yes [provider]  aspirin EC 81 MG tablet Take 81 mg by mouth at bedtime.    Yes [provider]  chlorthalidone (HYGROTON) 25 MG tablet  Take 1 tablet (25 mg total) by mouth daily. 06/03/19  Yes [name], DO  Cholecalciferol (VITAMIN D3) 10000 units TABS Take 10,000 Units by mouth daily.    Yes [provider]  Cyanocobalamin (VITAMIN B-12) 5000 MCG LOZG Take 1,000 mcg by mouth daily.    Yes [provider]  ezetimibe (ZETIA) 10 MG tablet TAKE 1 TABLET DAILY Patient taking differently: Take 10 mg by mouth daily.  08/20/19  Yes [name], DO  finasteride (PROSCAR) 5 MG tablet TAKE 1 TABLET DAILY Patient taking differently: Take 5 mg by mouth daily.  08/20/19  Yes [name], DO  fluticasone (FLONASE) 50 MCG/ACT nasal spray Place 2 sprays into both nostrils daily as needed for allergies or rhinitis.   Yes [provider]  furosemide (LASIX) 40 MG tablet Take 1 tablet (40 mg total) by mouth daily. Patient taking differently: Take 40 mg by mouth daily as needed for fluid.  04/27/19  Yes Wendling, Crosby Oyster, DO  levocetirizine (XYZAL) 5 MG tablet TAKE 1 TABLET EVERY EVENING Patient taking differently: Take 5 mg by mouth every evening.  07/02/19  Yes [name], DO  levothyroxine (SYNTHROID) 75 MCG tablet TAKE 1 TABLET DAILY Patient taking differently: Take 75 mcg by mouth daily before breakfast.  05/31/19  Yes Wendling, Crosby Oyster, DO  liothyronine (CYTOMEL) 5 MCG tablet Take 2 tablets (10 mcg total) by mouth daily. 09/22/18  Yes [name], DO  Multiple Vitamins-Minerals (MULTIVITAMIN ADULT EXTRA C PO) Take 1 tablet by mouth daily.    Yes [provider]  nitroGLYCERIN (NITROSTAT) 0.4 MG SL tablet Place 0.4 mg under the tongue every 5 (five) minutes as needed for chest pain.   Yes [provider]  omega-3 acid ethyl esters (LOVAZA) 1 g capsule Take 2 capsules (2 g total) by mouth 2 (two) times daily. 03/31/19  Yes Wendling, Crosby Oyster, DO  potassium chloride (KLOR-CON 10) 10 MEQ tablet Take 2 tabs with every 40 mg dose of  Lasix. Patient taking differently: Take 20 mEq by mouth daily as needed (with every 40mg  dose of Lasix).  04/28/19  Yes [name], DO  ramipril (ALTACE) 10 MG capsule Take 1 capsule (10 mg total) by mouth 2 (two) times daily. 03/31/19  Yes [name], DO  simvastatin (ZOCOR) 40 MG tablet TAKE 1 TABLET DAILY AT 6PM Patient taking differently: Take 40 mg by mouth at bedtime.  05/14/19  Yes [name], DO  tamsulosin (FLOMAX) 0.4 MG CAPS capsule TAKE 1 CAPSULE DAILY Patient taking differently: Take 0.4 mg by mouth at bedtime.  09/06/19  Yes [name], DO  Zinc 50 MG TABS Take 50 mg by mouth daily.    Yes [provider]  acetaminophen-codeine 120-12 MG/5ML solution Take 5 mLs by mouth at bedtime as needed for moderate pain (cough). Patient not taking: Reported on 09/08/2019 08/19/19   Mosie Lukes, MD  benzonatate (TESSALON) 100 MG capsule Take 1 capsule (100 mg total) by mouth 2 (two) times daily as needed for cough. Patient not taking: Reported on 09/08/2019 08/19/19   Mosie Lukes, MD  famotidine (PEPCID) 20 MG  tablet Take 1 tablet (20 mg total) by mouth daily. Patient not taking: Reported on 09/08/2019 09/07/19   Mackie Pai, PA-C    Physical Exam:  Constitutional: Obese elderly male who appears to be in no acute distress Vitals:   09/08/19 0630 09/08/19 0700 09/08/19 0800 09/08/19 1056  BP: 126/84 133/76 (!) 123/46 100/85  Pulse: 76 78 82 82  Resp: 17 14 13 16   Temp:    98 F (36.7 C)  TempSrc:    Oral  SpO2: 96% 97% 98% 97%  Weight:    108.2 kg  Height:    5\' 8"  (1.727 m)   Eyes: PERRL, lids and conjunctivae normal ENMT: Mucous membranes are moist. Posterior pharynx clear of any exudate or lesions.  Neck: normal, supple, no masses, no thyromegaly.  No significant JVD. Respiratory: clear to auscultation bilaterally, no wheezing, no crackles. Normal respiratory effort. No accessory muscle use.  Cardiovascular: Irregular.   Positive systolic murmur.  Trace lower  extremity edema. 2+ pedal pulses. No carotid bruits.  Abdomen: Epigastric tenderness appreciated tenderness, no masses palpated. No hepatosplenomegaly. Bowel sounds positive.  Musculoskeletal: no clubbing / cyanosis. No joint deformity upper and lower extremities. Good ROM, no contractures. Normal muscle tone.  Skin: no rashes, lesions, ulcers. No induration Neurologic: CN 2-12 grossly intact. Sensation intact, DTR normal. Strength 5/5 in all 4.  Psychiatric: Normal judgment and insight. Alert and oriented x 3. Normal mood.     Labs on Admission: I have personally reviewed following labs and imaging studies  CBC: Recent Labs  Lab 09/07/19 1621 09/07/19 2239  WBC 9.0 9.0  NEUTROABS 5,715 5.5  HGB 13.5 13.5  HCT 40.6 41.2  MCV 87.9 89.2  PLT 150 423*   Basic Metabolic Panel: Recent Labs  Lab 09/07/19 2239  NA 138  K 3.2*  CL 102  CO2 27  GLUCOSE 148*  BUN 24*  CREATININE 0.92  CALCIUM 8.9   GFR: Estimated Creatinine Clearance: 80.2 mL/min (by C-G formula based on SCr of 0.92 mg/dL). Liver Function Tests: Recent Labs  Lab 09/07/19 2239  AST 31  ALT 34  ALKPHOS 54  BILITOT <0.1*  PROT 6.9  ALBUMIN 3.8   Recent Labs  Lab 09/07/19 1621 09/07/19 2239  LIPASE 12 26   No results for input(s): AMMONIA in the last 168 hours. Coagulation Profile: No results for input(s): INR, PROTIME in the last 168 hours. Cardiac Enzymes: Recent Labs  Lab 09/07/19 1621  TROPONINI 514*   BNP (last 3 results) No results for input(s): PROBNP in the last 8760 hours. HbA1C: No results for input(s): HGBA1C in the last 72 hours. CBG: No results for input(s): GLUCAP in the last 168 hours. Lipid Profile: No results for input(s): CHOL, HDL, LDLCALC, TRIG, CHOLHDL, LDLDIRECT in the last 72 hours. Thyroid Function Tests: No results for input(s): TSH, T4TOTAL, FREET4, T3FREE, THYROIDAB in the last 72 hours. Anemia Panel: Recent Labs     09/07/19 1621  VITAMINB12 1,659*  IRON 51   Urine analysis:    Component Value Date/Time   COLORURINE YELLOW 09/07/2019 2317   APPEARANCEUR CLEAR 09/07/2019 2317   LABSPEC 1.025 09/07/2019 2317   PHURINE 5.5 09/07/2019 2317   GLUCOSEU NEGATIVE 09/07/2019 2317   HGBUR NEGATIVE 09/07/2019 2317   BILIRUBINUR NEGATIVE 09/07/2019 2317   KETONESUR NEGATIVE 09/07/2019 2317   PROTEINUR NEGATIVE 09/07/2019 2317   NITRITE NEGATIVE 09/07/2019 2317   LEUKOCYTESUR NEGATIVE 09/07/2019 2317   Sepsis Labs: Recent Results (from the past 240 hour(s))  SARS Coronavirus 2 by RT PCR (hospital order, performed in [facility] hospital lab) Nasopharyngeal Nasopharyngeal Swab     Status: None   Collection Time: 09/08/19  3:20 AM   Specimen: Nasopharyngeal Swab  Result Value Ref Range Status   SARS Coronavirus 2 NEGATIVE NEGATIVE Final    Comment: (NOTE) SARS-CoV-2 target nucleic acids are NOT DETECTED.  The SARS-CoV-2 RNA is generally detectable in upper and lower respiratory specimens during the acute phase of infection. The lowest concentration of SARS-CoV-2 viral copies this assay can detect is 250 copies / mL. A negative result does not preclude SARS-CoV-2 infection and should not be used as the sole basis for treatment or other patient management decisions.  A negative result may occur with improper specimen collection / handling, submission of specimen other than nasopharyngeal swab, presence of viral mutation(s) within the areas targeted by this assay, and inadequate number of viral copies (<250 copies / mL). A negative result must be combined with clinical observations, patient history, and epidemiological information.  Fact Sheet for Patients:   StrictlyIdeas.no  Fact Sheet for Healthcare Providers: BankingDealers.co.za  This test is not yet approved or  cleared by the [place] FDA and has been authorized for detection and/or  diagnosis of SARS-CoV-2 by FDA under an Emergency Use Authorization (EUA).  This EUA will remain in effect (meaning this test can be used) for the duration of the COVID-19 declaration under Section 564(b)(1) of the Act, 21 U.S.C. section 360bbb-3(b)(1), unless the authorization is terminated or revoked sooner.  Performed at [facility], [address] [place], [place] [address]      Radiological Exams on Admission: DG Chest 2 View  Result Date: 09/07/2019 CLINICAL DATA:  Cough EXAM: CHEST - 2 VIEW COMPARISON:  None. FINDINGS: Cardiac shadow is enlarged. Postsurgical changes are noted. The lungs are clear. Degenerative changes of the thoracic spine are noted. IMPRESSION: No active cardiopulmonary disease. Electronically Signed   By: Inez Catalina M.D.   On: 09/07/2019 17:32    EKG: Independently reviewed.  Atrial fibrillation at 80 bpm  Assessment/Plan NSTEMI: Acute. Patient present complaints of a dull gastric pain.  Patient found to have cardiac troponin I elevated up to 514 and thereafter high-sensitivity troponin 314-> 304.  Started on heparin drip at [facility].  Echocardiogram revealing EF of 35 to 40% with severe hypokinesis of the posterior wall.  Cardiology has been consulted and plan for cardiac cath in a.m. -Admit to a cardiac telemetry bed -Continue heparin drip per pharmacy -N.p.o. after midnight -[place] cardiology consultative services, we will follow for any further recommendations  Paroxysmal atrial fibrillation: Patient with prior history of postoperative atrial fibrillation following CABG, but had had no recurrence of episodes and was not on anticoagulation in the outpatient setting.  Currently found to be in atrial fibrillation, but appears to be rate controlled.  CHA2DS2-VASc score = 5 for which patient would benefit from being on anticoagulation. -Goal potassium 4 and magnesium 2 -Would likely benefit from being switched to oral  anticoagulation at some point in time  CAD: Patient with prior history of CABG over 20 years ago. -Continue statin  Essential hypertension: Pressures currently stable.  Home blood pressure medications include chlorthalidone 25 mg daily and ramipril 10 mg twice daily. - Held ramipril and chlorthalidone restart when medically appropriate  Diabetes mellitus type 2: Last hemoglobin A1c noted to be 6.6 on 06/22/2019. -Continue carb modified diet.  Hypothyroidism: Last TSH noted to be 2.19  with free T4 0.72 on 06/22/2019.. -Continue current regimen  Thrombocytopenia: Acute.  Platelet counts initially noted to be 143.  Patient denies any reports of bleeding. -Continue to monitor  Hypokalemia: Resolved.  On admission patient potassium noted to be 3.2.  Patient had been given 40 mEq of potassium chloride and repeat check 3.8 today. -Continue to monitor and replace as needed  Hyperlipidemia: Home medications include simvastatin 40 mg nightly omega-3 fatty acids, and Zetia 10 mg daily. -Continue simvastatin and Zetia  OSA on CPAP: Home medications include armodafinil 250 mg q. morning. -Patient declined CPAP at least for tonight  -Restart armodafinil when medically appropriate  BPH -Continue Proscar and finasteride  Obesity: BMI 36.27 kg/m  DVT prophylaxis: Heparin Code Status: Full Disposition Plan: To be determined Consults called: Cardiology Admission status: Inpatient  Norval Morton MD [facility] Pager [phone]   If 7PM-7AM, please contact night-coverage [email or web address] Password [facility]  09/08/2019, 12:25 PM

## 2019-09-08 NOTE — H&P (View-Only) (Signed)
The patient has been seen in conjunction with Sanjuana Letters, MD. All aspects of care have been considered and discussed. The patient has been personally interviewed, examined, and all clinical data has been reviewed.   New combined systolic and diastolic heart failure: Etiology likely mixed related to bypass graft failure, prior IMI, and hemodynamic burden of MR.   New MR, likely ischemically mediated. Echo is poor quality. TEE will be needed.  CAD with CABG 20 years ago. Lower chest discomfort post prandially is probably angina related to graft failure.  PLAN: R & L heart cath with cor and bypass angio; Further management of AF pending cath results; TEE will be needed to determine if  To determine if MV repair or MitraClip could be helpful.  The patient was counseled to undergo left and R heart catheterization, coronary and bypass angiography, and possible percutaneous coronary intervention with stent implantation. The procedural risks and benefits were discussed in detail. The risks discussed included death, stroke, myocardial infarction, life-threatening bleeding, limb ischemia, kidney injury, allergy, and possible emergency cardiac surgery. The risk of these significant complications were estimated to occur less than 1% of the time. After discussion, the patient has agreed to proceed.  New Atrial Fibrillation of unknown duration: Continue IV heparin until coronary status is known. Thereafter will need initiation of Apixaban for stroke prevention. Possible AF could be contributing to decreased EF if poor rate control in past.  The patient's CHADS2-VASc score is 5, indicating a 7.2% annual risk of stroke.  CHF History: Yes HTN History: Yes Age : 2 + Diabetes History: No Stroke History: No Vascular Disease History: Yes Gender: Male  { Signed,  Sinclair Grooms, MD    09/08/2019 6:14 PM    Cardiology Consult    Patient ID: Nyal Schachter MRN: [id number], DOB/AGE: Sep 22, 1940    Admit date: 09/07/2019 Date of Consult: 09/08/2019  Primary Physician: [name], DO Primary Cardiologist: [facility] - Dr. Sharlet Salina Requesting Provider: Dr. Fuller Plan  Cardiology Attending: Dr. [name], MD  Patient Profile    Eliberto Sole is a 79 y.o. male with a history of MI in 1994 c/b cardiac arrest s/p lytics and PTCA, CABG in 2001 (LIMA to LAD, SVG to OM-2, SVG to right PDA, SVG to D-2) with post-op AF, carotid stenosis s/p L CEA and right subclavian stenosis, diet-controlled DM, HTN, obesity, OSA on CPAP, mild cognitive impairment, depression/mood disorder, and hypothyroidism. LV function seems to be preserved based on available records, EF of 50-55% in 2010. He is being seen today for the evaluation of 1 week of epigastric discomfort.   History of Present Illness    Mr. Strojny was seen by his primary care provider yesterday for upper abdominal pain for the past week. He describes the pain as dull. He notes the pain is intermittent and wraps around to his back. He states the pain primarily occurs after meals and improves with movement and with ibuprofen. He denies similar symptoms to this in the past. He notes during his prior MI, he had heart burn like symptoms in the middle of his chest. Of note, Mr. Barbary finished a 10 day course of cefdinir with pro-biotics a week and a half ago for suspected bronchitis. The patient notes at first feeling as though his abdominal pain may have been due to the antibiotic/probiotics.   He was seen by his primary care provider for this pain yesterday, an EKG was performed and the patient was found to be in  rate controlled AFIB. Troponins were drawn and the patient's PCP recommend the patient follow up with his cardiologist at [facility]. The patient's troponin's came back elevated later that evening and so he was instructed to go to the ED at [facility].   He did note an episode of shortness of breath while walking two flights of  stairs yesterday. The patient attributed this to the heat and having to wear a mask. He denied having any chest pain, dizziness, or lightheadedness with these episodes. He notes an increase in fatigue for the past few weeks since being ill with the bronchitis. He describes the fatigue as become tired with activity, he does not feel fatigued when waking up. He notes on/off lower extremity swelling and takes furosemide PRN during worsening of the swelling. He is unsure why he has this swelling and states he he has never been told if it was venous insufficiency or heart failure related.   The patient denies any other recent illness. He denies feeling dyspneic with exertion aside from one episode yesterday. He denies chest pain when exerting himself or performing his usual ADL's. Denies fever, chills, difficulty passing his stools, nausea, vomiting, diarrhea. Denies shortness of breath when lying flat.   He has no other complaints at this time.   Past Medical History   Past Medical History:  Diagnosis Date  . Arthritis   . CAD (coronary artery disease)   . Concussion Summer 2012  . Essential hypertension 02/26/2017  . Heart disease   . Hyperlipidemia   . Hypothyroidism 10/15/2017  . Major depressive disorder   . Obstructive sleep apnea on CPAP   . Poor flexibility of tendon 12/01/2018  . Subungual hematoma of digit of hand 12/01/2018  . Subungual hematoma of right foot 12/01/2018  . Thyroid disease   . Type 2 diabetes mellitus without complication, without long-term current use of insulin ([place]) 02/26/2017    Past Surgical History:  Procedure Laterality Date  . BYPASS GRAFT  2001  . CAROTID ARTERY ANGIOPLASTY    . TOTAL HIP ARTHROPLASTY  1994, 1995     Allergies  Allergen Reactions  . Sulfa Antibiotics Rash    Questionable, he developed a diffuse rash 2 days after stopping Bactrim   Inpatient Medications    . aspirin EC  81 mg Oral QHS  . chlorthalidone  25 mg Oral Daily  . ezetimibe   10 mg Oral Daily  . finasteride  5 mg Oral Daily  . levocetirizine  5 mg Oral QPM  . [START ON 09/09/2019] levothyroxine  75 mcg Oral QAC breakfast  . liothyronine  10 mcg Oral Daily  . simvastatin  40 mg Oral QHS  . tamsulosin  0.4 mg Oral QHS    Family History    Family History  Problem Relation Age of Onset  . Dementia Father        Concerns surrounding Lewy body dementia, but never officially diagnosed  . Cancer Neg Hx   Father had a CABG at the age of 38 Mother - HTN Sister - Healthy  Social History    Social History   Socioeconomic History  . Marital status: Married    Spouse name: Not on file  . Number of children: Not on file  . Years of education: 69  . Highest education level: Doctorate  Occupational History  . Not on file  Tobacco Use  . Smoking status: Former Smoker    Types: Cigarettes  . Smokeless tobacco: Never Used  Vaping  Use  . Vaping Use: Never used  Substance and Sexual Activity  . Alcohol use: Yes    Alcohol/week: 3.0 standard drinks    Types: 3 Standard drinks or equivalent per week    Comment: 1 drink 3 times per week  . Drug use: No  . Sexual activity: Yes  Other Topics Concern  . 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 Difficulty of Paying Living Expenses: Not hard at all  Food Insecurity:   . Worried About Charity fundraiser in the Last Year: Not on file  . Ran Out of Food in the Last Year: Not on file  Transportation Needs:   . Lack of Transportation (Medical): Not on file  . Lack of Transportation (Non-Medical): Not on file  Physical Activity:   . Days of Exercise per Week: Not on file  . Minutes of Exercise per Session: Not on file  Stress:   . Feeling of Stress : Not on file  Social  Connections:   . Frequency of Communication with Friends and Family: Not on file  . Frequency of Social Gatherings with Friends and Family: Not on file  . Attends Religious Services: Not on file  . Active Member of Clubs or Organizations: Not on file  . Attends Archivist Meetings: Not on file  . Marital Status: Not on file  Intimate Partner Violence:   . Fear of Current or Ex-Partner: Not on file  . Emotionally Abused: Not on file  . Physically Abused: Not on file  . Sexually Abused: Not on file     Review of Systems    Review of Systems  Constitutional: Positive for malaise/fatigue. Negative for chills and fever.  Respiratory: Positive for shortness of breath. Negative for cough.   Cardiovascular: Positive for leg swelling. Negative for chest pain and palpitations.  Gastrointestinal: Positive for abdominal pain. Negative for constipation, diarrhea, nausea and vomiting.  Neurological: Negative for dizziness, tingling, sensory change, focal weakness and headaches.  All other systems reviewed and are negative.  Physical Exam    Blood pressure 100/85, pulse 82, temperature 98 F (36.7 C), temperature source Oral, resp. rate 16, height 5\' 8"  (1.727 m), weight 108.2 kg, SpO2 97 %.     Intake/Output Summary (Last 24 hours) at 09/08/2019 1814 Last data filed at 09/08/2019 1803 Gross per 24 hour  Intake 240 ml  Output 700 ml  Net -460 ml   Wt Readings from Last 3 Encounters:  09/08/19 108.2 kg  09/07/19 109.4 kg  06/22/19 111.3 kg    Physical Exam Vitals and nursing note reviewed.  Constitutional:      General: He is not in acute distress.    Appearance: Normal appearance. He is not ill-appearing, toxic-appearing or diaphoretic.  HENT:     Head: Normocephalic and atraumatic.  Eyes:     Extraocular Movements: Extraocular movements intact.  Cardiovascular:     Rate and Rhythm: Normal rate. Rhythm irregular.     Heart sounds: Murmur heard.  Systolic murmur is  present.  No friction rub.     Comments: Murmur is appreciated more when the patient is sitting upright. Less apparent when lying down.  Pulmonary:  Effort: Pulmonary effort is normal. No respiratory distress.     Breath sounds: Normal breath sounds. No stridor. No wheezing, rhonchi or rales.  Chest:     Chest wall: No tenderness.  Abdominal:     General: Abdomen is flat. Bowel sounds are normal.     Palpations: Abdomen is soft.     Tenderness: There is abdominal tenderness (Upper Abdomen. Patient notes tenderness similar to pain he describes in HPI.).  Musculoskeletal:     Cervical back: Normal range of motion.     Right lower leg: 1+ Edema present.     Left lower leg: 1+ Edema present.  Neurological:     General: No focal deficit present.     Mental Status: He is alert and oriented to person, place, and time.  Psychiatric:        Mood and Affect: Mood normal.        Behavior: Behavior normal.    Labs    Troponin (Point of Care Test) No results for input(s): TROPIPOC in the last 72 hours. Recent Labs    09/07/19 1621  TROPONINI 514*   Lab Results  Component Value Date   WBC 9.4 09/08/2019   HGB 13.6 09/08/2019   HCT 42.2 09/08/2019   MCV 89.4 09/08/2019   PLT 142 (L) 09/08/2019    Recent Labs  Lab 09/07/19 2239 09/07/19 2239 09/08/19 1254  NA 138   < > 140  K 3.2*   < > 3.8  CL 102   < > 103  CO2 27   < > 27  BUN 24*   < > 16  CREATININE 0.92   < > 0.92  CALCIUM 8.9   < > 9.3  PROT 6.9  --   --   BILITOT <0.1*  --   --   ALKPHOS 54  --   --   ALT 34  --   --   AST 31  --   --   GLUCOSE 148*   < > 133*   < > = values in this interval not displayed.   Lab Results  Component Value Date   CHOL 135 09/08/2019   HDL 42 09/08/2019   LDLCALC 76 09/08/2019   TRIG 85 09/08/2019   No results found for: [facility]   Radiology Studies    DG Chest 2 View  Result Date: 09/07/2019 CLINICAL DATA:  Cough EXAM: CHEST - 2 VIEW COMPARISON:  None. FINDINGS: Cardiac  shadow is enlarged. Postsurgical changes are noted. The lungs are clear. Degenerative changes of the thoracic spine are noted. IMPRESSION: No active cardiopulmonary disease. Electronically Signed   By: Inez Catalina M.D.   On: 09/07/2019 17:32   ECHOCARDIOGRAM COMPLETE  Result Date: 09/08/2019    ECHOCARDIOGRAM REPORT   Patient Name:   GARV KUECHLE Date of Exam: 09/08/2019 Medical Rec #:  [id number]      Height:       68.0 in Accession #:    [id number]     Weight:       238.5 lb Date of Birth:  04/18/1940      BSA:          2.203 m Patient Age:    63 years       BP:           100/85 mmHg Patient Gender: M              HR:  97 bpm. Exam Location:  Inpatient Procedure: 2D Echo and Intracardiac Opacification Agent Indications:    NSTEMI I21.4  History:        Patient has no prior history of Echocardiogram examinations.                 CAD; Risk Factors:Dyslipidemia and Diabetes.  Sonographer:    Mikki Santee RDCS (AE) Referring Phys: [id number] RONDELL A Yulisa Chirico IMPRESSIONS  1. Poor quality study despite contrast. EF 35-40% with severe HK of the posterior wall. Moderate to severe MR related with eccentric jet that is poorly quantitated. Would strongly consider TEE.  2. Left ventricular ejection fraction, by estimation, is 35 to 40%. The left ventricle has moderately decreased function. The left ventricle demonstrates regional wall motion abnormalities (see scoring diagram/findings for description). Left ventricular  diastolic function could not be evaluated.  3. Right ventricular systolic function is normal. The right ventricular size is normal. There is normal pulmonary artery systolic pressure. The estimated right ventricular systolic pressure is 66.0 mmHg.  4. Left atrial size was moderately dilated.  5. Right atrial size was mildly dilated.  6. There is moderate to severe MR that is poorly quantitated, posteriorly directed, and eccentric. Suspect this is related to restricted movement of the PMVL is  systole due to posterior wall hypokinesis (IIIB pathology). There were poor acoustic windows  for TTE as well. Would consider a TEE for better evaluation of the pathology if clinically indicated. The mitral valve is abnormal. Moderate to severe mitral valve regurgitation. No evidence of mitral stenosis.  7. The aortic valve is tricuspid. Aortic valve regurgitation is not visualized. Mild aortic valve sclerosis is present, with no evidence of aortic valve stenosis.  8. The inferior vena cava is normal in size with greater than 50% respiratory variability, suggesting right atrial pressure of 3 mmHg. FINDINGS  Left Ventricle: Left ventricular ejection fraction, by estimation, is 35 to 40%. The left ventricle has moderately decreased function. The left ventricle demonstrates regional wall motion abnormalities. Definity contrast agent was given IV to delineate the left ventricular endocardial borders. The left ventricular internal cavity size was normal in size. There is no left ventricular hypertrophy. Abnormal (paradoxical) septal motion consistent with post-operative status. Left ventricular diastolic function could not be evaluated due to atrial fibrillation. Left ventricular diastolic function could not be evaluated.  LV Wall Scoring: The posterior wall is hypokinetic. Right Ventricle: The right ventricular size is normal. No increase in right ventricular wall thickness. Right ventricular systolic function is normal. There is normal pulmonary artery systolic pressure. The tricuspid regurgitant velocity is 2.40 m/s, and  with an assumed right atrial pressure of 3 mmHg, the estimated right ventricular systolic pressure is 63.0 mmHg. Left Atrium: Left atrial size was moderately dilated. Right Atrium: Right atrial size was mildly dilated. Pericardium: Trivial pericardial effusion is present. Mitral Valve: There is moderate to severe MR that is poorly quantitated, posteriorly directed, and eccentric. Suspect this is  related to restricted movement of the PMVL is systole due to posterior wall hypokinesis (IIIB pathology). There were poor acoustic windows for TTE as well. Would consider a TEE for better evaluation of the pathology if clinically indicated. The mitral valve is abnormal. Moderate to severe mitral valve regurgitation. No evidence of mitral valve stenosis. Tricuspid Valve: The tricuspid valve is grossly normal. Tricuspid valve regurgitation is mild . No evidence of tricuspid stenosis. Aortic Valve: The aortic valve is tricuspid. Aortic valve regurgitation is not visualized. Mild aortic valve sclerosis is  present, with no evidence of aortic valve stenosis. Pulmonic Valve: The pulmonic valve was grossly normal. Pulmonic valve regurgitation is trivial. No evidence of pulmonic stenosis. Aorta: The aortic root and ascending aorta are structurally normal, with no evidence of dilitation. Venous: The inferior vena cava is normal in size with greater than 50% respiratory variability, suggesting right atrial pressure of 3 mmHg. IAS/Shunts: The atrial septum is grossly normal.  LEFT VENTRICLE PLAX 2D LVIDd:         5.08 cm LVIDs:         3.36 cm LV PW:         0.88 cm LV IVS:        1.18 cm LVOT diam:     2.30 cm LV SV:         47 LV SV Index:   21 LVOT Area:     4.15 cm  RIGHT VENTRICLE RV S prime:     8.27 cm/s TAPSE (M-mode): 1.6 cm LEFT ATRIUM              Index       RIGHT ATRIUM           Index LA diam:        5.00 cm  2.27 cm/m  RA Area:     23.50 cm LA Vol (A2C):   96.9 ml  43.99 ml/m RA Volume:   63.30 ml  28.74 ml/m LA Vol (A4C):   130.0 ml 59.02 ml/m LA Biplane Vol: 116.0 ml 52.66 ml/m  AORTIC VALVE LVOT Vmax:   61.30 cm/s LVOT Vmean:  38.567 cm/s LVOT VTI:    0.112 m  AORTA Ao Root diam: 3.30 cm MR Peak grad: 97.8 mmHg   TRICUSPID VALVE MR Mean grad: 68.5 mmHg   TR Peak grad:   23.0 mmHg MR Vmax:      494.50 cm/s TR Vmax:        240.00 cm/s MR Vmean:     398.0 cm/s                           SHUNTS                            Systemic VTI:  0.11 m                           Systemic Diam: 2.30 cm Eleonore Chiquito MD Electronically signed by Eleonore Chiquito MD Signature Date/Time: 09/08/2019/3:34:47 PM    Final     ECG & Cardiac Imaging    ECG -  rate of 75. Atrial Fibrillation. New small inferior Q waves c/t 2019. Prior EKG from 2019, rate of 60. Normal sinus rhythm.  ECHO - As per above, but of significance; LVEF of 35-40% severe hypokinesis of posterior wall, moderate-severe mitral regurgitation, moderately dilated left atrium  Assessment & Plan    Vidur Knust is a 79 y.o. male with a history of MI in 1994 c/b cardiac arrest s/p lytics and PTCA, CABG in 2001 (LIMA to LAD, SVG to OM-2, SVG to right PDA, SVG to D-2) with post-op AF, carotid stenosis s/p L CEA and right subclavian stenosis, diet-controlled DM, HTN, obesity, OSA on CPAP, mild cognitive impairment, depression/mood disorder, and hypothyroidism. LV function is newly reduced. He is being seen today for the evaluation of 1 week of epigastric discomfort, worsening fatigue over the  past 2 weeks, and one episode of shortness of breath while exerting himself by walking up two flights of stairs.  He was found to be in atrial fibrillation and sent to the ED at [facility]. EKG revealed rate controlled afib. Prior EKG from 2019 was normal sinus rhythm with no abnormalities. Initial troponin was elevated at 514, 2nd troponin of 314, and last drawn troponin at 0042 this am was 304. ECHO today revealed LVEF of 35-40%, moderate-severe mitral regurgitation, moderately dilated left atrium. Prior ECHO from 2002 revealed mild hypokinesis of anteroseptal and inferior walls with mild mitral regurgitation, ECHO from 2010 with LVEF of 50-55% without evidence of mitral regurgitation.   Upon physical examination the patient was found to have a systolic murmur at the left fifth intercostal space as well as left axilla. The murmur was louder when the patient was sitting up at  the edge of the bed, less apparent when lying flat. Lower extremity edema appreciated.   With the above history, clinical exam, imaging, and laboratory findings, suspect patient's symptoms present from a recent ischemic etiology. Patient's last saw his cardiologist, Dr. Sharlet Salina 03/2017 and in his note, patient had a regular rate and rhythm, no murmur was appreciated and patient was found to have good exercise tolerance with no symptoms for 21 years since his CABG. No indication for a stress test was noted at that time.   Will need to be further assessed and patient has been scheduled for diagnostic catheterization tomorrow morning. Because of patient's history of left subclavian stenosis, recommend right sided groin approach. Patient denies history of complications with prior catheterizations, but notes that a femoral approach was taken. The risk and benefits including risk of bleeding, stroke, heart attack, and death were discussed with patient.   Patient will also need TEE to better assess pathology and severity of findings from ECHO performed today. The ischemic etiology could further explain patient's other findings of hypokinesis, mitral regurgitation, dilated left atrium, and atrial fibrillation, this will be further determined by catheterization and TEE. Patient currently rate controlled AFIB at this time, with no signs or symptoms for immediate cardioversion.   Recommend patient continue home medications, NPO at midnight. Scheduled for diagnostic catheterization tomorrow morning.   Signed, Sinclair Grooms, MD 09/08/2019, 6:14 PM   Attending: Dr. [name] For questions or updates, please contact   Please consult [email or web address] for contact info under Cardiology/STEMI.

## 2019-09-08 NOTE — Progress Notes (Signed)
ANTICOAGULATION CONSULT NOTE - Initial Consult  Pharmacy Consult for Heparin Indication: chest pain/ACS  Allergies  Allergen Reactions  . Sulfa Antibiotics Rash    Questionable, he developed a diffuse rash 2 days after stopping Bactrim    Patient Measurements: Height: 5\' 8"  (172.7 cm) Weight: 108.9 kg (240 lb) IBW/kg (Calculated) : 68.4 Heparin Dosing Weight: 92 kg  Vital Signs: Temp: 98 F (36.7 C) (08/24 2229) Temp Source: Oral (08/24 2229) BP: 114/70 (08/25 0230) Pulse Rate: 103 (08/25 0230)  Labs: Recent Labs    09/07/19 1621 09/07/19 2239 09/07/19 2240 09/08/19 0042  HGB 13.5 13.5  --   --   HCT 40.6 41.2  --   --   PLT 150 143*  --   --   CREATININE  --  0.92  --   --   TROPONINIHS  --   --  314* 304*    Estimated Creatinine Clearance: 80.5 mL/min (by C-G formula based on SCr of 0.92 mg/dL).   Medical History: Past Medical History:  Diagnosis Date  . Arthritis   . CAD (coronary artery disease)   . Concussion Summer 2012  . Essential hypertension 02/26/2017  . Heart disease   . Hyperlipidemia   . Hypothyroidism 10/15/2017  . Major depressive disorder   . Obstructive sleep apnea on CPAP   . Poor flexibility of tendon 12/01/2018  . Subungual hematoma of digit of hand 12/01/2018  . Subungual hematoma of right foot 12/01/2018  . Thyroid disease   . Type 2 diabetes mellitus without complication, without long-term current use of insulin ([place]) 02/26/2017    Medications:  See electronic med rec  Assessment: 79 y.o. M presents with CP. To begin heparin for r/o ACS. No AC PTA. H/H ok on admission, Plt slightly low at 143.   Goal of Therapy:  Heparin level 0.3-0.7 units/ml Monitor platelets by anticoagulation protocol: Yes   Plan:  Heparin IV bolus 4000 units Heparin gtt at 1300 units/hr Will f/u heparin level in 8 hours Daily heparin level and CBC  Sherlon Handing, PharmD, BCPS Please see amion for complete clinical pharmacist phone  list 09/08/2019,2:51 AM

## 2019-09-08 NOTE — Progress Notes (Signed)
ANTICOAGULATION CONSULT NOTE - Follow Up Consult  Pharmacy Consult for IV Heparin Indication: chest pain/ACS  Allergies  Allergen Reactions  . Sulfa Antibiotics Rash    Questionable, he developed a diffuse rash 2 days after stopping Bactrim    Patient Measurements: Height: 5\' 8"  (172.7 cm) Weight: 108.2 kg (238 lb 8.6 oz) (scale a) IBW/kg (Calculated) : 68.4 Heparin Dosing Weight: 92 kg  Vital Signs: Temp: 97.8 F (36.6 C) (08/25 2026) Temp Source: Oral (08/25 2026) BP: 140/85 (08/25 2026) Pulse Rate: 75 (08/25 2026)  Labs: Recent Labs    09/07/19 1621 09/07/19 1621 09/07/19 2239 09/07/19 2240 09/08/19 0042 09/08/19 1254 09/08/19 2307  HGB 13.5   < > 13.5  --   --  13.6  --   HCT 40.6  --  41.2  --   --  42.2  --   PLT 150  --  143*  --   --  142*  --   HEPARINUNFRC  --   --   --   --   --  0.32 0.34  CREATININE  --   --  0.92  --   --  0.92  --   TROPONINIHS  --   --   --  314* 304*  --   --    < > = values in this interval not displayed.    Estimated Creatinine Clearance: 80.2 mL/min (by C-G formula based on SCr of 0.92 mg/dL).  Assessment: 79 year old male on IV Heparin for ACS.   Initial heparin level is at goal (0.34) on gtt at 1400 units/hr. No bleeding noted. Planning heart cath and then initiation of apixaban post procedures.  Goal of Therapy:  Heparin level 0.3-0.7 units/ml Monitor platelets by anticoagulation protocol: Yes   Plan:  Continue IV Heparin at 1400 units/hr.  Will f/u a.m. heparin level  Sherlon Handing, PharmD, BCPS Please see amion for complete clinical pharmacist phone list 09/08/2019,11:51 PM

## 2019-09-08 NOTE — Consult Note (Signed)
Entered in error

## 2019-09-08 NOTE — Consult Note (Addendum)
The patient has been seen in conjunction with Sanjuana Letters, MD. All aspects of care have been considered and discussed. The patient has been personally interviewed, examined, and all clinical data has been reviewed.   New combined systolic and diastolic heart failure: Etiology likely mixed related to bypass graft failure, prior IMI, and hemodynamic burden of MR.   New MR, likely ischemically mediated. Echo is poor quality. TEE will be needed.  CAD with CABG 20 years ago. Lower chest discomfort post prandially is probably angina related to graft failure.  PLAN: R & L heart cath with cor and bypass angio; Further management of AF pending cath results; TEE will be needed to determine if  To determine if MV repair or MitraClip could be helpful.  The patient was counseled to undergo left and R heart catheterization, coronary and bypass angiography, and possible percutaneous coronary intervention with stent implantation. The procedural risks and benefits were discussed in detail. The risks discussed included death, stroke, myocardial infarction, life-threatening bleeding, limb ischemia, kidney injury, allergy, and possible emergency cardiac surgery. The risk of these significant complications were estimated to occur less than 1% of the time. After discussion, the patient has agreed to proceed.  New Atrial Fibrillation of unknown duration: Continue IV heparin until coronary status is known. Thereafter will need initiation of Apixaban for stroke prevention. Possible AF could be contributing to decreased EF if poor rate control in past.  The patient's CHADS2-VASc score is 5, indicating a 7.2% annual risk of stroke.  CHF History: Yes HTN History: Yes Age : 79 + Diabetes History: No Stroke History: No Vascular Disease History: Yes Gender: Male  { Signed,  Raymond Grooms, MD    09/08/2019 6:14 PM    Cardiology Consult    Patient ID: Raymond Christensen MRN: [id number], DOB/AGE: 06-22-1940    Admit date: 09/07/2019 Date of Consult: 09/08/2019  Primary Physician: [name], DO Primary Cardiologist: [facility] - Dr. Sharlet Salina Requesting Provider: Dr. Fuller Plan  Cardiology Attending: Dr. [name], MD  Patient Profile    Raymond Christensen is a 79 y.o. male with a history of MI in 1994 c/b cardiac arrest s/p lytics and PTCA, CABG in 2001 (LIMA to LAD, SVG to OM-2, SVG to right PDA, SVG to D-2) with post-op AF, carotid stenosis s/p L CEA and right subclavian stenosis, diet-controlled DM, HTN, obesity, OSA on CPAP, mild cognitive impairment, depression/mood disorder, and hypothyroidism. LV function seems to be preserved based on available records, EF of 50-55% in 2010. He is being seen today for the evaluation of 1 week of epigastric discomfort.   History of Present Illness    Raymond Christensen was seen by his primary care provider yesterday for upper abdominal pain for the past week. He describes the pain as dull. He notes the pain is intermittent and wraps around to his back. He states the pain primarily occurs after meals and improves with movement and with ibuprofen. He denies similar symptoms to this in the past. He notes during his prior MI, he had heart burn like symptoms in the middle of his chest. Of note, Raymond Christensen finished a 10 day course of cefdinir with pro-biotics a week and a half ago for suspected bronchitis. The patient notes at first feeling as though his abdominal pain may have been due to the antibiotic/probiotics.   He was seen by his primary care provider for this pain yesterday, an EKG was performed and the patient was found to be in  rate controlled AFIB. Troponins were drawn and the patient's PCP recommend the patient follow up with his cardiologist at [facility]. The patient's troponin's came back elevated later that evening and so he was instructed to go to the ED at [facility].   He did note an episode of shortness of breath while walking two flights of  stairs yesterday. The patient attributed this to the heat and having to wear a mask. He denied having any chest pain, dizziness, or lightheadedness with these episodes. He notes an increase in fatigue for the past few weeks since being ill with the bronchitis. He describes the fatigue as become tired with activity, he does not feel fatigued when waking up. He notes on/off lower extremity swelling and takes furosemide PRN during worsening of the swelling. He is unsure why he has this swelling and states he he has never been told if it was venous insufficiency or heart failure related.   The patient denies any other recent illness. He denies feeling dyspneic with exertion aside from one episode yesterday. He denies chest pain when exerting himself or performing his usual ADL's. Denies fever, chills, difficulty passing his stools, nausea, vomiting, diarrhea. Denies shortness of breath when lying flat.   He has no other complaints at this time.   Past Medical History   Past Medical History:  Diagnosis Date  . Arthritis   . CAD (coronary artery disease)   . Concussion Summer 2012  . Essential hypertension 02/26/2017  . Heart disease   . Hyperlipidemia   . Hypothyroidism 10/15/2017  . Major depressive disorder   . Obstructive sleep apnea on CPAP   . Poor flexibility of tendon 12/01/2018  . Subungual hematoma of digit of hand 12/01/2018  . Subungual hematoma of right foot 12/01/2018  . Thyroid disease   . Type 2 diabetes mellitus without complication, without long-term current use of insulin ([place]) 02/26/2017    Past Surgical History:  Procedure Laterality Date  . BYPASS GRAFT  2001  . CAROTID ARTERY ANGIOPLASTY    . TOTAL HIP ARTHROPLASTY  1994, 1995     Allergies  Allergen Reactions  . Sulfa Antibiotics Rash    Questionable, he developed a diffuse rash 2 days after stopping Bactrim   Inpatient Medications    . aspirin EC  81 mg Oral QHS  . chlorthalidone  25 mg Oral Daily  . ezetimibe   10 mg Oral Daily  . finasteride  5 mg Oral Daily  . levocetirizine  5 mg Oral QPM  . [START ON 09/09/2019] levothyroxine  75 mcg Oral QAC breakfast  . liothyronine  10 mcg Oral Daily  . simvastatin  40 mg Oral QHS  . tamsulosin  0.4 mg Oral QHS    Family History    Family History  Problem Relation Age of Onset  . Dementia Father        Concerns surrounding Lewy body dementia, but never officially diagnosed  . Cancer Neg Hx   Father had a CABG at the age of 44 Mother - HTN Sister - Healthy  Social History    Social History   Socioeconomic History  . Marital status: Married    Spouse name: Not on file  . Number of children: Not on file  . Years of education: 30  . Highest education level: Doctorate  Occupational History  . Not on file  Tobacco Use  . Smoking status: Former Smoker    Types: Cigarettes  . Smokeless tobacco: Never Used  Vaping  Use  . Vaping Use: Never used  Substance and Sexual Activity  . Alcohol use: Yes    Alcohol/week: 3.0 standard drinks    Types: 3 Standard drinks or equivalent per week    Comment: 1 drink 3 times per week  . Drug use: No  . Sexual activity: Yes  Other Topics Concern  . 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 Difficulty of Paying Living Expenses: Not hard at all  Food Insecurity:   . Worried About Charity fundraiser in the Last Year: Not on file  . Ran Out of Food in the Last Year: Not on file  Transportation Needs:   . Lack of Transportation (Medical): Not on file  . Lack of Transportation (Non-Medical): Not on file  Physical Activity:   . Days of Exercise per Week: Not on file  . Minutes of Exercise per Session: Not on file  Stress:   . Feeling of Stress : Not on file  Social  Connections:   . Frequency of Communication with Friends and Family: Not on file  . Frequency of Social Gatherings with Friends and Family: Not on file  . Attends Religious Services: Not on file  . Active Member of Clubs or Organizations: Not on file  . Attends Archivist Meetings: Not on file  . Marital Status: Not on file  Intimate Partner Violence:   . Fear of Current or Ex-Partner: Not on file  . Emotionally Abused: Not on file  . Physically Abused: Not on file  . Sexually Abused: Not on file     Review of Systems    Review of Systems  Constitutional: Positive for malaise/fatigue. Negative for chills and fever.  Respiratory: Positive for shortness of breath. Negative for cough.   Cardiovascular: Positive for leg swelling. Negative for chest pain and palpitations.  Gastrointestinal: Positive for abdominal pain. Negative for constipation, diarrhea, nausea and vomiting.  Neurological: Negative for dizziness, tingling, sensory change, focal weakness and headaches.  All other systems reviewed and are negative.  Physical Exam    Blood pressure 100/85, pulse 82, temperature 98 F (36.7 C), temperature source Oral, resp. rate 16, height 5\' 8"  (1.727 m), weight 108.2 kg, SpO2 97 %.     Intake/Output Summary (Last 24 hours) at 09/08/2019 1814 Last data filed at 09/08/2019 1803 Gross per 24 hour  Intake 240 ml  Output 700 ml  Net -460 ml   Wt Readings from Last 3 Encounters:  09/08/19 108.2 kg  09/07/19 109.4 kg  06/22/19 111.3 kg    Physical Exam Vitals and nursing note reviewed.  Constitutional:      General: He is not in acute distress.    Appearance: Normal appearance. He is not ill-appearing, toxic-appearing or diaphoretic.  HENT:     Head: Normocephalic and atraumatic.  Eyes:     Extraocular Movements: Extraocular movements intact.  Cardiovascular:     Rate and Rhythm: Normal rate. Rhythm irregular.     Heart sounds: Murmur heard.  Systolic murmur is  present.  No friction rub.     Comments: Murmur is appreciated more when the patient is sitting upright. Less apparent when lying down.  Pulmonary:  Effort: Pulmonary effort is normal. No respiratory distress.     Breath sounds: Normal breath sounds. No stridor. No wheezing, rhonchi or rales.  Chest:     Chest wall: No tenderness.  Abdominal:     General: Abdomen is flat. Bowel sounds are normal.     Palpations: Abdomen is soft.     Tenderness: There is abdominal tenderness (Upper Abdomen. Patient notes tenderness similar to pain he describes in HPI.).  Musculoskeletal:     Cervical back: Normal range of motion.     Right lower leg: 1+ Edema present.     Left lower leg: 1+ Edema present.  Neurological:     General: No focal deficit present.     Mental Status: He is alert and oriented to person, place, and time.  Psychiatric:        Mood and Affect: Mood normal.        Behavior: Behavior normal.    Labs    Troponin (Point of Care Test) No results for input(s): TROPIPOC in the last 72 hours. Recent Labs    09/07/19 1621  TROPONINI 514*   Lab Results  Component Value Date   WBC 9.4 09/08/2019   HGB 13.6 09/08/2019   HCT 42.2 09/08/2019   MCV 89.4 09/08/2019   PLT 142 (L) 09/08/2019    Recent Labs  Lab 09/07/19 2239 09/07/19 2239 09/08/19 1254  NA 138   < > 140  K 3.2*   < > 3.8  CL 102   < > 103  CO2 27   < > 27  BUN 24*   < > 16  CREATININE 0.92   < > 0.92  CALCIUM 8.9   < > 9.3  PROT 6.9  --   --   BILITOT <0.1*  --   --   ALKPHOS 54  --   --   ALT 34  --   --   AST 31  --   --   GLUCOSE 148*   < > 133*   < > = values in this interval not displayed.   Lab Results  Component Value Date   CHOL 135 09/08/2019   HDL 42 09/08/2019   LDLCALC 76 09/08/2019   TRIG 85 09/08/2019   No results found for: [facility]   Radiology Studies    DG Chest 2 View  Result Date: 09/07/2019 CLINICAL DATA:  Cough EXAM: CHEST - 2 VIEW COMPARISON:  None. FINDINGS: Cardiac  shadow is enlarged. Postsurgical changes are noted. The lungs are clear. Degenerative changes of the thoracic spine are noted. IMPRESSION: No active cardiopulmonary disease. Electronically Signed   By: Inez Catalina M.D.   On: 09/07/2019 17:32   ECHOCARDIOGRAM COMPLETE  Result Date: 09/08/2019    ECHOCARDIOGRAM REPORT   Patient Name:   Raymond Christensen Date of Exam: 09/08/2019 Medical Rec #:  [id number]      Height:       68.0 in Accession #:    [id number]     Weight:       238.5 lb Date of Birth:  1940/02/05      BSA:          2.203 m Patient Age:    64 years       BP:           100/85 mmHg Patient Gender: M              HR:  97 bpm. Exam Location:  Inpatient Procedure: 2D Echo and Intracardiac Opacification Agent Indications:    NSTEMI I21.4  History:        Patient has no prior history of Echocardiogram examinations.                 CAD; Risk Factors:Dyslipidemia and Diabetes.  Sonographer:    Mikki Santee RDCS (AE) Referring Phys: [id number] RONDELL A Skipper Dacosta IMPRESSIONS  1. Poor quality study despite contrast. EF 35-40% with severe HK of the posterior wall. Moderate to severe MR related with eccentric jet that is poorly quantitated. Would strongly consider TEE.  2. Left ventricular ejection fraction, by estimation, is 35 to 40%. The left ventricle has moderately decreased function. The left ventricle demonstrates regional wall motion abnormalities (see scoring diagram/findings for description). Left ventricular  diastolic function could not be evaluated.  3. Right ventricular systolic function is normal. The right ventricular size is normal. There is normal pulmonary artery systolic pressure. The estimated right ventricular systolic pressure is 54.0 mmHg.  4. Left atrial size was moderately dilated.  5. Right atrial size was mildly dilated.  6. There is moderate to severe MR that is poorly quantitated, posteriorly directed, and eccentric. Suspect this is related to restricted movement of the PMVL is  systole due to posterior wall hypokinesis (IIIB pathology). There were poor acoustic windows  for TTE as well. Would consider a TEE for better evaluation of the pathology if clinically indicated. The mitral valve is abnormal. Moderate to severe mitral valve regurgitation. No evidence of mitral stenosis.  7. The aortic valve is tricuspid. Aortic valve regurgitation is not visualized. Mild aortic valve sclerosis is present, with no evidence of aortic valve stenosis.  8. The inferior vena cava is normal in size with greater than 50% respiratory variability, suggesting right atrial pressure of 3 mmHg. FINDINGS  Left Ventricle: Left ventricular ejection fraction, by estimation, is 35 to 40%. The left ventricle has moderately decreased function. The left ventricle demonstrates regional wall motion abnormalities. Definity contrast agent was given IV to delineate the left ventricular endocardial borders. The left ventricular internal cavity size was normal in size. There is no left ventricular hypertrophy. Abnormal (paradoxical) septal motion consistent with post-operative status. Left ventricular diastolic function could not be evaluated due to atrial fibrillation. Left ventricular diastolic function could not be evaluated.  LV Wall Scoring: The posterior wall is hypokinetic. Right Ventricle: The right ventricular size is normal. No increase in right ventricular wall thickness. Right ventricular systolic function is normal. There is normal pulmonary artery systolic pressure. The tricuspid regurgitant velocity is 2.40 m/s, and  with an assumed right atrial pressure of 3 mmHg, the estimated right ventricular systolic pressure is 08.6 mmHg. Left Atrium: Left atrial size was moderately dilated. Right Atrium: Right atrial size was mildly dilated. Pericardium: Trivial pericardial effusion is present. Mitral Valve: There is moderate to severe MR that is poorly quantitated, posteriorly directed, and eccentric. Suspect this is  related to restricted movement of the PMVL is systole due to posterior wall hypokinesis (IIIB pathology). There were poor acoustic windows for TTE as well. Would consider a TEE for better evaluation of the pathology if clinically indicated. The mitral valve is abnormal. Moderate to severe mitral valve regurgitation. No evidence of mitral valve stenosis. Tricuspid Valve: The tricuspid valve is grossly normal. Tricuspid valve regurgitation is mild . No evidence of tricuspid stenosis. Aortic Valve: The aortic valve is tricuspid. Aortic valve regurgitation is not visualized. Mild aortic valve sclerosis is  present, with no evidence of aortic valve stenosis. Pulmonic Valve: The pulmonic valve was grossly normal. Pulmonic valve regurgitation is trivial. No evidence of pulmonic stenosis. Aorta: The aortic root and ascending aorta are structurally normal, with no evidence of dilitation. Venous: The inferior vena cava is normal in size with greater than 50% respiratory variability, suggesting right atrial pressure of 3 mmHg. IAS/Shunts: The atrial septum is grossly normal.  LEFT VENTRICLE PLAX 2D LVIDd:         5.08 cm LVIDs:         3.36 cm LV PW:         0.88 cm LV IVS:        1.18 cm LVOT diam:     2.30 cm LV SV:         47 LV SV Index:   21 LVOT Area:     4.15 cm  RIGHT VENTRICLE RV S prime:     8.27 cm/s TAPSE (M-mode): 1.6 cm LEFT ATRIUM              Index       RIGHT ATRIUM           Index LA diam:        5.00 cm  2.27 cm/m  RA Area:     23.50 cm LA Vol (A2C):   96.9 ml  43.99 ml/m RA Volume:   63.30 ml  28.74 ml/m LA Vol (A4C):   130.0 ml 59.02 ml/m LA Biplane Vol: 116.0 ml 52.66 ml/m  AORTIC VALVE LVOT Vmax:   61.30 cm/s LVOT Vmean:  38.567 cm/s LVOT VTI:    0.112 m  AORTA Ao Root diam: 3.30 cm MR Peak grad: 97.8 mmHg   TRICUSPID VALVE MR Mean grad: 68.5 mmHg   TR Peak grad:   23.0 mmHg MR Vmax:      494.50 cm/s TR Vmax:        240.00 cm/s MR Vmean:     398.0 cm/s                           SHUNTS                            Systemic VTI:  0.11 m                           Systemic Diam: 2.30 cm Eleonore Chiquito MD Electronically signed by Eleonore Chiquito MD Signature Date/Time: 09/08/2019/3:34:47 PM    Final     ECG & Cardiac Imaging    ECG -  rate of 75. Atrial Fibrillation. New small inferior Q waves c/t 2019. Prior EKG from 2019, rate of 60. Normal sinus rhythm.  ECHO - As per above, but of significance; LVEF of 35-40% severe hypokinesis of posterior wall, moderate-severe mitral regurgitation, moderately dilated left atrium  Assessment & Plan    Jaedyn Christensen is a 79 y.o. male with a history of MI in 1994 c/b cardiac arrest s/p lytics and PTCA, CABG in 2001 (LIMA to LAD, SVG to OM-2, SVG to right PDA, SVG to D-2) with post-op AF, carotid stenosis s/p L CEA and right subclavian stenosis, diet-controlled DM, HTN, obesity, OSA on CPAP, mild cognitive impairment, depression/mood disorder, and hypothyroidism. LV function is newly reduced. He is being seen today for the evaluation of 1 week of epigastric discomfort, worsening fatigue over the  past 2 weeks, and one episode of shortness of breath while exerting himself by walking up two flights of stairs.  He was found to be in atrial fibrillation and sent to the ED at [facility]. EKG revealed rate controlled afib. Prior EKG from 2019 was normal sinus rhythm with no abnormalities. Initial troponin was elevated at 514, 2nd troponin of 314, and last drawn troponin at 0042 this am was 304. ECHO today revealed LVEF of 35-40%, moderate-severe mitral regurgitation, moderately dilated left atrium. Prior ECHO from 2002 revealed mild hypokinesis of anteroseptal and inferior walls with mild mitral regurgitation, ECHO from 2010 with LVEF of 50-55% without evidence of mitral regurgitation.   Upon physical examination the patient was found to have a systolic murmur at the left fifth intercostal space as well as left axilla. The murmur was louder when the patient was sitting up at  the edge of the bed, less apparent when lying flat. Lower extremity edema appreciated.   With the above history, clinical exam, imaging, and laboratory findings, suspect patient's symptoms present from a recent ischemic etiology. Patient's last saw his cardiologist, Dr. Sharlet Salina 03/2017 and in his note, patient had a regular rate and rhythm, no murmur was appreciated and patient was found to have good exercise tolerance with no symptoms for 21 years since his CABG. No indication for a stress test was noted at that time.   Will need to be further assessed and patient has been scheduled for diagnostic catheterization tomorrow morning. Because of patient's history of left subclavian stenosis, recommend right sided groin approach. Patient denies history of complications with prior catheterizations, but notes that a femoral approach was taken. The risk and benefits including risk of bleeding, stroke, heart attack, and death were discussed with patient.   Patient will also need TEE to better assess pathology and severity of findings from ECHO performed today. The ischemic etiology could further explain patient's other findings of hypokinesis, mitral regurgitation, dilated left atrium, and atrial fibrillation, this will be further determined by catheterization and TEE. Patient currently rate controlled AFIB at this time, with no signs or symptoms for immediate cardioversion.   Recommend patient continue home medications, NPO at midnight. Scheduled for diagnostic catheterization tomorrow morning.   Signed, Raymond Grooms, MD 09/08/2019, 6:14 PM   Attending: Dr. [name] For questions or updates, please contact   Please consult [email or web address] for contact info under Cardiology/STEMI.

## 2019-09-09 ENCOUNTER — Inpatient Hospital Stay (HOSPITAL_COMMUNITY)
Admission: EM | Disposition: A | Discharge status: Home / Self Care | Payer: Self-pay | Source: Home / Self Care | Attending: Internal Medicine

## 2019-09-09 DIAGNOSIS — I2510 Atherosclerotic heart disease of native coronary artery without angina pectoris: Secondary | ICD-10-CM

## 2019-09-09 DIAGNOSIS — I480 Paroxysmal atrial fibrillation: Secondary | ICD-10-CM

## 2019-09-09 DIAGNOSIS — I25709 Atherosclerosis of coronary artery bypass graft(s), unspecified, with unspecified angina pectoris: Secondary | ICD-10-CM

## 2019-09-09 DIAGNOSIS — M10072 Idiopathic gout, left ankle and foot: Secondary | ICD-10-CM

## 2019-09-09 DIAGNOSIS — I25810 Atherosclerosis of coronary artery bypass graft(s) without angina pectoris: Secondary | ICD-10-CM

## 2019-09-09 DIAGNOSIS — I214 Non-ST elevation (NSTEMI) myocardial infarction: Principal | ICD-10-CM

## 2019-09-09 HISTORY — PX: RIGHT/LEFT HEART CATH AND CORONARY/GRAFT ANGIOGRAPHY: CATH118267

## 2019-09-09 LAB — POCT I-STAT EG7
Acid-Base Excess: 4 mmol/L — ABNORMAL HIGH (ref 0.0–2.0)
Acid-Base Excess: 4 mmol/L — ABNORMAL HIGH (ref 0.0–2.0)
Bicarbonate: 29.9 mmol/L — ABNORMAL HIGH (ref 20.0–28.0)
Bicarbonate: 30.1 mmol/L — ABNORMAL HIGH (ref 20.0–28.0)
Calcium, Ion: 1.27 mmol/L (ref 1.15–1.40)
Calcium, Ion: 1.25 mmol/L (ref 1.15–1.40)
HCT: 41 % (ref 39.0–52.0)
HCT: 40 % (ref 39.0–52.0)
Hemoglobin: 13.9 g/dL (ref 13.0–17.0)
Hemoglobin: 13.6 g/dL (ref 13.0–17.0)
O2 Saturation: 71 %
O2 Saturation: 70 %
Potassium: 3.6 mmol/L (ref 3.5–5.1)
Potassium: 3.8 mmol/L (ref 3.5–5.1)
Sodium: 141 mmol/L (ref 135–145)
Sodium: 140 mmol/L (ref 135–145)
TCO2: 31 mmol/L (ref 22–32)
TCO2: 32 mmol/L (ref 22–32)
pCO2, Ven: 48.9 mmHg (ref 44.0–60.0)
pCO2, Ven: 49 mmHg (ref 44.0–60.0)
pH, Ven: 7.394 (ref 7.250–7.430)
pH, Ven: 7.396 (ref 7.250–7.430)
pO2, Ven: 38 mmHg (ref 32.0–45.0)
pO2, Ven: 37 mmHg (ref 32.0–45.0)

## 2019-09-09 LAB — BASIC METABOLIC PANEL
Anion gap: 11 (ref 5–15)
BUN: 14 mg/dL (ref 8–23)
CO2: 27 mmol/L (ref 22–32)
Calcium: 9.1 mg/dL (ref 8.9–10.3)
Chloride: 101 mmol/L (ref 98–111)
Creatinine, Ser: 1.01 mg/dL (ref 0.61–1.24)
GFR calc Af Amer: >60 mL/min (ref >60)
GFR calc non Af Amer: >60 mL/min (ref >60)
Glucose, Bld: 139 mg/dL — ABNORMAL HIGH (ref 70–99)
Potassium: 3.6 mmol/L (ref 3.5–5.1)
Sodium: 139 mmol/L (ref 135–145)

## 2019-09-09 LAB — POCT ACTIVATED CLOTTING TIME
Activated Clotting Time: 329 seconds
Activated Clotting Time: 252 seconds
Activated Clotting Time: 202 seconds
Activated Clotting Time: 180 seconds

## 2019-09-09 LAB — MAGNESIUM: Magnesium: 1.8 mg/dL (ref 1.7–2.4)

## 2019-09-09 LAB — POCT I-STAT 7, (LYTES, BLD GAS, ICA,H+H)
Acid-Base Excess: 4 mmol/L — ABNORMAL HIGH (ref 0.0–2.0)
Bicarbonate: 29.5 mmol/L — ABNORMAL HIGH (ref 20.0–28.0)
Calcium, Ion: 1.25 mmol/L (ref 1.15–1.40)
HCT: 40 % (ref 39.0–52.0)
Hemoglobin: 13.6 g/dL (ref 13.0–17.0)
O2 Saturation: 100 %
Potassium: 3.6 mmol/L (ref 3.5–5.1)
Sodium: 140 mmol/L (ref 135–145)
TCO2: 31 mmol/L (ref 22–32)
pCO2 arterial: 49.2 mmHg — ABNORMAL HIGH (ref 32.0–48.0)
pH, Arterial: 7.386 (ref 7.350–7.450)
pO2, Arterial: 189 mmHg — ABNORMAL HIGH (ref 83.0–108.0)

## 2019-09-09 LAB — CBC
HCT: 41.3 % (ref 39.0–52.0)
Hemoglobin: 13.3 g/dL (ref 13.0–17.0)
MCH: 28.9 pg (ref 26.0–34.0)
MCHC: 32.2 g/dL (ref 30.0–36.0)
MCV: 89.8 fL (ref 80.0–100.0)
Platelets: 134 10*3/uL — ABNORMAL LOW (ref 150–400)
RBC: 4.6 MIL/uL (ref 4.22–5.81)
RDW: 13.7 % (ref 11.5–15.5)
WBC: 10.5 10*3/uL (ref 4.0–10.5)
nRBC: 0 % (ref 0.0–0.2)

## 2019-09-09 LAB — GLUCOSE, CAPILLARY: Glucose-Capillary: 123 mg/dL — ABNORMAL HIGH (ref 70–99)

## 2019-09-09 SURGERY — RIGHT/LEFT HEART CATH AND CORONARY/GRAFT ANGIOGRAPHY
Anesthesia: LOCAL

## 2019-09-09 MED ORDER — HYDRALAZINE HCL 20 MG/ML IJ SOLN: 10.0000 mg | INTRAMUSCULAR | Status: AC | PRN

## 2019-09-09 MED ORDER — FAMOTIDINE IN NACL 20-0.9 MG/50ML-% IV SOLN
INTRAVENOUS | Status: AC | PRN
  Administered 2019-09-09: 20 mg via INTRAVENOUS

## 2019-09-09 MED ORDER — HEPARIN (PORCINE) IN NACL 1000-0.9 UT/500ML-% IV SOLN
INTRAVENOUS | Status: AC
  Filled 2019-09-09: qty 1000

## 2019-09-09 MED ORDER — SPIRONOLACTONE 12.5 MG HALF TABLET
12.5000 mg | ORAL_TABLET | Freq: Every day | ORAL | Status: DC
  Administered 2019-09-10 – 2019-09-14 (×4): 12.5 mg via ORAL
  Filled 2019-09-09 (×5): qty 1

## 2019-09-09 MED ORDER — APIXABAN 5 MG PO TABS
5.0000 mg | ORAL_TABLET | Freq: Two times a day (BID) | ORAL | Status: DC
  Administered 2019-09-10 – 2019-09-14 (×9): 5 mg via ORAL
  Filled 2019-09-09 (×9): qty 1

## 2019-09-09 MED ORDER — MIDAZOLAM HCL 2 MG/2ML IJ SOLN
INTRAMUSCULAR | Status: AC
  Filled 2019-09-09: qty 2

## 2019-09-09 MED ORDER — HEPARIN SODIUM (PORCINE) 1000 UNIT/ML IJ SOLN
INTRAMUSCULAR | Status: DC | PRN
  Administered 2019-09-09: 12000 [IU] via INTRAVENOUS

## 2019-09-09 MED ORDER — CLOPIDOGREL BISULFATE 300 MG PO TABS
ORAL_TABLET | ORAL | Status: DC | PRN
  Administered 2019-09-09: 600 mg via ORAL

## 2019-09-09 MED ORDER — LIDOCAINE HCL (PF) 1 % IJ SOLN
INTRAMUSCULAR | Status: AC
  Filled 2019-09-09: qty 30

## 2019-09-09 MED ORDER — LIDOCAINE HCL (PF) 1 % IJ SOLN
INTRAMUSCULAR | Status: DC | PRN
  Administered 2019-09-09: 30 mL via INTRADERMAL

## 2019-09-09 MED ORDER — LABETALOL HCL 5 MG/ML IV SOLN: 10.0000 mg | INTRAVENOUS | Status: AC | PRN

## 2019-09-09 MED ORDER — SODIUM CHLORIDE 0.9 % IV SOLN: INTRAVENOUS | Status: AC

## 2019-09-09 MED ORDER — ONDANSETRON HCL 4 MG/2ML IJ SOLN: 4.0000 mg | Freq: Four times a day (QID) | INTRAMUSCULAR | Status: DC | PRN

## 2019-09-09 MED ORDER — MAGNESIUM SULFATE 2 GM/50ML IV SOLN
2.0000 g | Freq: Once | INTRAVENOUS | Status: AC
  Administered 2019-09-09: 2 g via INTRAVENOUS
  Filled 2019-09-09: qty 50

## 2019-09-09 MED ORDER — FENTANYL CITRATE (PF) 100 MCG/2ML IJ SOLN
INTRAMUSCULAR | Status: DC | PRN
Start: 2019-09-09 — End: 2019-09-09
  Administered 2019-09-09: 25 ug via INTRAVENOUS

## 2019-09-09 MED ORDER — SODIUM CHLORIDE 0.9 % IV SOLN: 250.0000 mL | INTRAVENOUS | Status: DC | PRN

## 2019-09-09 MED ORDER — NITROGLYCERIN 1 MG/10 ML FOR IR/CATH LAB
INTRA_ARTERIAL | Status: AC
  Filled 2019-09-09: qty 10

## 2019-09-09 MED ORDER — CLOPIDOGREL BISULFATE 300 MG PO TABS
ORAL_TABLET | ORAL | Status: AC
  Filled 2019-09-09: qty 2

## 2019-09-09 MED ORDER — CARVEDILOL 3.125 MG PO TABS
3.1250 mg | ORAL_TABLET | Freq: Two times a day (BID) | ORAL | Status: DC
  Administered 2019-09-10: 3.125 mg via ORAL
  Filled 2019-09-09: qty 1

## 2019-09-09 MED ORDER — SODIUM CHLORIDE 0.9% FLUSH
3.0000 mL | Freq: Two times a day (BID) | INTRAVENOUS | Status: DC
  Administered 2019-09-09 – 2019-09-13 (×7): 3 mL via INTRAVENOUS

## 2019-09-09 MED ORDER — IOHEXOL 350 MG/ML SOLN
INTRAVENOUS | Status: DC | PRN
  Administered 2019-09-09: 125 mL via INTRA_ARTERIAL

## 2019-09-09 MED ORDER — POTASSIUM CHLORIDE CRYS ER 20 MEQ PO TBCR
40.0000 meq | EXTENDED_RELEASE_TABLET | Freq: Once | ORAL | Status: DC
  Filled 2019-09-09: qty 2

## 2019-09-09 MED ORDER — VERAPAMIL HCL 2.5 MG/ML IV SOLN
INTRAVENOUS | Status: AC
  Filled 2019-09-09: qty 2

## 2019-09-09 MED ORDER — MORPHINE SULFATE (PF) 2 MG/ML IV SOLN: 2.0000 mg | INTRAVENOUS | Status: DC | PRN

## 2019-09-09 MED ORDER — SACUBITRIL-VALSARTAN 24-26 MG PO TABS
1.0000 | ORAL_TABLET | Freq: Two times a day (BID) | ORAL | Status: DC
  Administered 2019-09-10 – 2019-09-14 (×9): 1 via ORAL
  Filled 2019-09-09 (×9): qty 1

## 2019-09-09 MED ORDER — ACETAMINOPHEN 325 MG PO TABS: 650.0000 mg | ORAL_TABLET | ORAL | Status: DC | PRN

## 2019-09-09 MED ORDER — FAMOTIDINE IN NACL 20-0.9 MG/50ML-% IV SOLN
INTRAVENOUS | Status: AC
  Filled 2019-09-09: qty 50

## 2019-09-09 MED ORDER — CLOPIDOGREL BISULFATE 75 MG PO TABS
75.0000 mg | ORAL_TABLET | Freq: Every day | ORAL | Status: DC
  Administered 2019-09-10 – 2019-09-14 (×5): 75 mg via ORAL
  Filled 2019-09-09 (×5): qty 1

## 2019-09-09 MED ORDER — SODIUM CHLORIDE 0.9% FLUSH: 3.0000 mL | INTRAVENOUS | Status: DC | PRN

## 2019-09-09 MED ORDER — TICAGRELOR 90 MG PO TABS
ORAL_TABLET | ORAL | Status: AC
  Filled 2019-09-09: qty 2

## 2019-09-09 MED ORDER — FENTANYL CITRATE (PF) 100 MCG/2ML IJ SOLN
INTRAMUSCULAR | Status: AC
  Filled 2019-09-09: qty 2

## 2019-09-09 MED ORDER — HEPARIN (PORCINE) IN NACL 1000-0.9 UT/500ML-% IV SOLN
INTRAVENOUS | Status: DC | PRN
  Administered 2019-09-09 (×2): 500 mL

## 2019-09-09 MED ORDER — HEPARIN SODIUM (PORCINE) 1000 UNIT/ML IJ SOLN
INTRAMUSCULAR | Status: AC
  Filled 2019-09-09: qty 2

## 2019-09-09 MED ORDER — ASPIRIN 81 MG PO CHEW: 81.0000 mg | CHEWABLE_TABLET | Freq: Every day | ORAL | Status: DC

## 2019-09-09 MED ORDER — MIDAZOLAM HCL 2 MG/2ML IJ SOLN
INTRAMUSCULAR | Status: DC | PRN
  Administered 2019-09-09: 1 mg via INTRAVENOUS

## 2019-09-09 SURGICAL SUPPLY — 20 items
BALLN SAPPHIRE 2.0X12 (BALLOONS) ×2
BALLOON SAPPHIRE 2.0X12 (BALLOONS) IMPLANT
CATH INFINITI 5FR MULTPACK ANG (CATHETERS) ×1 IMPLANT
CATH SWAN GANZ 7F STRAIGHT (CATHETERS) ×1 IMPLANT
CATHETER LAUNCHER 6FR RCB (CATHETERS) ×1 IMPLANT
KIT ENCORE 26 ADVANTAGE (KITS) ×1 IMPLANT
KIT HEART LEFT (KITS) ×2 IMPLANT
PACK CARDIAC CATHETERIZATION (CUSTOM PROCEDURE TRAY) ×2 IMPLANT
SHEATH PINNACLE 5F 10CM (SHEATH) ×1 IMPLANT
SHEATH PINNACLE 6F 10CM (SHEATH) ×1 IMPLANT
SHEATH PINNACLE 7F 10CM (SHEATH) ×1 IMPLANT
STENT RESOLUTE ONYX 3.0X18 (Permanent Stent) ×1 IMPLANT
STENT SYNERGY XD 2.75X16 (Permanent Stent) IMPLANT
SYNERGY XD 2.75X16 (Permanent Stent) ×2 IMPLANT
SYR MEDRAD MARK 7 150ML (SYRINGE) ×2 IMPLANT
TRANSDUCER W/STOPCOCK (MISCELLANEOUS) ×2 IMPLANT
TUBING CIL FLEX 10 FLL-RA (TUBING) ×2 IMPLANT
WIRE ASAHI PROWATER 180CM (WIRE) ×1 IMPLANT
WIRE EMERALD 3MM-J .035X150CM (WIRE) ×1 IMPLANT
WIRE EMERALD 3MM-J .035X260CM (WIRE) ×1 IMPLANT

## 2019-09-09 NOTE — Progress Notes (Deleted)
Progress Note  Patient Name: Raymond Christensen Date of Encounter: 09/09/2019  [facility] Cardiologist: [facility] - Dr. Sharlet Salina  Subjective   No recurrent chest/epigastic discomfort. For R/L cath later today.   Inpatient Medications    Scheduled Meds: . aspirin EC  81 mg Oral QHS  . ezetimibe  10 mg Oral Daily  . finasteride  5 mg Oral Daily  . levothyroxine  75 mcg Oral QAC breakfast  . liothyronine  10 mcg Oral Daily  . loratadine  10 mg Oral Daily  . simvastatin  40 mg Oral QHS  . sodium chloride flush  3 mL Intravenous Q12H  . tamsulosin  0.4 mg Oral QHS   Continuous Infusions: . sodium chloride    . sodium chloride 10 mL/hr at 09/09/19 0635  . heparin 1,400 Units/hr (09/08/19 1839)   PRN Meds: sodium chloride, acetaminophen, fluticasone, nitroGLYCERIN, ondansetron (ZOFRAN) IV, sodium chloride flush   Vital Signs    Vitals:   09/08/19 1056 09/08/19 2026 09/09/19 0152 09/09/19 0629  BP: 100/85 140/85 131/88 135/79  Pulse: 82 75 76 82  Resp: 16 18 17 17   Temp: 98 F (36.7 C) 97.8 F (36.6 C) (!) 97.5 F (36.4 C) 97.9 F (36.6 C)  TempSrc: Oral Oral Oral Oral  SpO2: 97% 99% 98% 98%  Weight: 108.2 kg  107.5 kg   Height: 5\' 8"  (1.727 m)       Intake/Output Summary (Last 24 hours) at 09/09/2019 0831 Last data filed at 09/09/2019 2947 Gross per 24 hour  Intake 853.37 ml  Output 1300 ml  Net -446.63 ml   Last 3 Weights 09/09/2019 09/08/2019 09/07/2019  Weight (lbs) 236 lb 14.4 oz 238 lb 8.6 oz 240 lb  Weight (kg) 107.457 kg 108.2 kg 108.863 kg      Telemetry    Atrial fibrillation at 80s - Personally Reviewed  ECG    N/A  Physical Exam   GEN: No acute distress.   Neck: No JVD Cardiac: Irregular,+ murmurs, rubs, or gallops.  Respiratory: Clear to auscultation bilaterally. GI: Soft, nontender, non-distended  MS: +edema; No deformity. Neuro:  Nonfocal  Psych: Normal affect   Labs    High Sensitivity Troponin:   Recent Labs  Lab  09/07/19 2240 09/08/19 0042  TROPONINIHS 314* 304*      Chemistry Recent Labs  Lab 09/07/19 2239 09/08/19 1254 09/09/19 0707  NA 138 140 139  K 3.2* 3.8 3.6  CL 102 103 101  CO2 27 27 27   GLUCOSE 148* 133* 139*  BUN 24* 16 14  CREATININE 0.92 0.92 1.01  CALCIUM 8.9 9.3 9.1  PROT 6.9  --   --   ALBUMIN 3.8  --   --   AST 31  --   --   ALT 34  --   --   ALKPHOS 54  --   --   BILITOT <0.1*  --   --   GFRNONAA >60 >60 >60  GFRAA >60 >60 >60  ANIONGAP 9 10 11      Hematology Recent Labs  Lab 09/07/19 2239 09/08/19 1254 09/09/19 0707  WBC 9.0 9.4 10.5  RBC 4.62 4.72 4.60  HGB 13.5 13.6 13.3  HCT 41.2 42.2 41.3  MCV 89.2 89.4 89.8  MCH 29.2 28.8 28.9  MCHC 32.8 32.2 32.2  RDW 13.6 13.6 13.7  PLT 143* 142* 134*    BNP Recent Labs  Lab 09/08/19 1800  BNP 264.7*     DDimer No results for input(s):  DDIMER in the last 168 hours.   Radiology    DG Chest 2 View  Result Date: 09/07/2019 CLINICAL DATA:  Cough EXAM: CHEST - 2 VIEW COMPARISON:  None. FINDINGS: Cardiac shadow is enlarged. Postsurgical changes are noted. The lungs are clear. Degenerative changes of the thoracic spine are noted. IMPRESSION: No active cardiopulmonary disease. Electronically Signed   By: Inez Catalina M.D.   On: 09/07/2019 17:32   ECHOCARDIOGRAM COMPLETE  Result Date: 09/08/2019    ECHOCARDIOGRAM REPORT   Patient Name:   Raymond Christensen Date of Exam: 09/08/2019 Medical Rec #:  [id number]      Height:       68.0 in Accession #:    [id number]     Weight:       238.5 lb Date of Birth:  [birth year]      BSA:          2.203 m Patient Age:    79 years       BP:           100/85 mmHg Patient Gender: M              HR:           97 bpm. Exam Location:  Inpatient Procedure: 2D Echo and Intracardiac Opacification Agent Indications:    NSTEMI I21.4  History:        Patient has no prior history of Echocardiogram examinations.                 CAD; Risk Factors:Dyslipidemia and Diabetes.  Sonographer:    Mikki Santee RDCS (AE) Referring Phys: [id number] RONDELL A SMITH IMPRESSIONS  1. Poor quality study despite contrast. EF 35-40% with severe HK of the posterior wall. Moderate to severe MR related with eccentric jet that is poorly quantitated. Would strongly consider TEE.  2. Left ventricular ejection fraction, by estimation, is 35 to 40%. The left ventricle has moderately decreased function. The left ventricle demonstrates regional wall motion abnormalities (see scoring diagram/findings for description). Left ventricular  diastolic function could not be evaluated.  3. Right ventricular systolic function is normal. The right ventricular size is normal. There is normal pulmonary artery systolic pressure. The estimated right ventricular systolic pressure is 42.3 mmHg.  4. Left atrial size was moderately dilated.  5. Right atrial size was mildly dilated.  6. There is moderate to severe MR that is poorly quantitated, posteriorly directed, and eccentric. Suspect this is related to restricted movement of the PMVL is systole due to posterior wall hypokinesis (IIIB pathology). There were poor acoustic windows  for TTE as well. Would consider a TEE for better evaluation of the pathology if clinically indicated. The mitral valve is abnormal. Moderate to severe mitral valve regurgitation. No evidence of mitral stenosis.  7. The aortic valve is tricuspid. Aortic valve regurgitation is not visualized. Mild aortic valve sclerosis is present, with no evidence of aortic valve stenosis.  8. The inferior vena cava is normal in size with greater than 50% respiratory variability, suggesting right atrial pressure of 3 mmHg. FINDINGS  Left Ventricle: Left ventricular ejection fraction, by estimation, is 35 to 40%. The left ventricle has moderately decreased function. The left ventricle demonstrates regional wall motion abnormalities. Definity contrast agent was given IV to delineate the left ventricular endocardial borders. The left  ventricular internal cavity size was normal in size. There is no left ventricular hypertrophy. Abnormal (paradoxical) septal motion consistent with post-operative status. Left ventricular diastolic function could not be  evaluated due to atrial fibrillation. Left ventricular diastolic function could not be evaluated.  LV Wall Scoring: The posterior wall is hypokinetic. Right Ventricle: The right ventricular size is normal. No increase in right ventricular wall thickness. Right ventricular systolic function is normal. There is normal pulmonary artery systolic pressure. The tricuspid regurgitant velocity is 2.40 m/s, and  with an assumed right atrial pressure of 3 mmHg, the estimated right ventricular systolic pressure is 50.3 mmHg. Left Atrium: Left atrial size was moderately dilated. Right Atrium: Right atrial size was mildly dilated. Pericardium: Trivial pericardial effusion is present. Mitral Valve: There is moderate to severe MR that is poorly quantitated, posteriorly directed, and eccentric. Suspect this is related to restricted movement of the PMVL is systole due to posterior wall hypokinesis (IIIB pathology). There were poor acoustic windows for TTE as well. Would consider a TEE for better evaluation of the pathology if clinically indicated. The mitral valve is abnormal. Moderate to severe mitral valve regurgitation. No evidence of mitral valve stenosis. Tricuspid Valve: The tricuspid valve is grossly normal. Tricuspid valve regurgitation is mild . No evidence of tricuspid stenosis. Aortic Valve: The aortic valve is tricuspid. Aortic valve regurgitation is not visualized. Mild aortic valve sclerosis is present, with no evidence of aortic valve stenosis. Pulmonic Valve: The pulmonic valve was grossly normal. Pulmonic valve regurgitation is trivial. No evidence of pulmonic stenosis. Aorta: The aortic root and ascending aorta are structurally normal, with no evidence of dilitation. Venous: The inferior vena cava  is normal in size with greater than 50% respiratory variability, suggesting right atrial pressure of 3 mmHg. IAS/Shunts: The atrial septum is grossly normal.  LEFT VENTRICLE PLAX 2D LVIDd:         5.08 cm LVIDs:         3.36 cm LV PW:         0.88 cm LV IVS:        1.18 cm LVOT diam:     2.30 cm LV SV:         47 LV SV Index:   21 LVOT Area:     4.15 cm  RIGHT VENTRICLE RV S prime:     8.27 cm/s TAPSE (M-mode): 1.6 cm LEFT ATRIUM              Index       RIGHT ATRIUM           Index LA diam:        5.00 cm  2.27 cm/m  RA Area:     23.50 cm LA Vol (A2C):   96.9 ml  43.99 ml/m RA Volume:   63.30 ml  28.74 ml/m LA Vol (A4C):   130.0 ml 59.02 ml/m LA Biplane Vol: 116.0 ml 52.66 ml/m  AORTIC VALVE LVOT Vmax:   61.30 cm/s LVOT Vmean:  38.567 cm/s LVOT VTI:    0.112 m  AORTA Ao Root diam: 3.30 cm MR Peak grad: 97.8 mmHg   TRICUSPID VALVE MR Mean grad: 68.5 mmHg   TR Peak grad:   23.0 mmHg MR Vmax:      494.50 cm/s TR Vmax:        240.00 cm/s MR Vmean:     398.0 cm/s                           SHUNTS  Systemic VTI:  0.11 m                           Systemic Diam: 2.30 cm Eleonore Chiquito MD Electronically signed by Eleonore Chiquito MD Signature Date/Time: 09/08/2019/3:34:47 PM    Final     Cardiac Studies   Echo 09/08/19 1. Poor quality study despite contrast. EF 35-40% with severe HK of the  posterior wall. Moderate to severe MR related with eccentric jet that is  poorly quantitated. Would strongly consider TEE.  2. Left ventricular ejection fraction, by estimation, is 35 to 40%. The  left ventricle has moderately decreased function. The left ventricle  demonstrates regional wall motion abnormalities (see scoring  diagram/findings for description). Left ventricular  diastolic function could not be evaluated.  3. Right ventricular systolic function is normal. The right ventricular  size is normal. There is normal pulmonary artery systolic pressure. The  estimated right ventricular  systolic pressure is 28.4 mmHg.  4. Left atrial size was moderately dilated.  5. Right atrial size was mildly dilated.  6. There is moderate to severe MR that is poorly quantitated, posteriorly  directed, and eccentric. Suspect this is related to restricted movement of  the PMVL is systole due to posterior wall hypokinesis (IIIB pathology).  There were poor acoustic windows  for TTE as well. Would consider a TEE for better evaluation of the  pathology if clinically indicated. The mitral valve is abnormal. Moderate  to severe mitral valve regurgitation. No evidence of mitral stenosis.  7. The aortic valve is tricuspid. Aortic valve regurgitation is not  visualized. Mild aortic valve sclerosis is present, with no evidence of  aortic valve stenosis.  8. The inferior vena cava is normal in size with greater than 50%  respiratory variability, suggesting right atrial pressure of 3 mmHg.   Pending Cath   Patient Profile     Massey Ruhland is a 79 y.o. male with a history of MI in 1994 c/b cardiac arrest s/p lytics and PTCA, CABG in 2001 (LIMA to LAD, SVG to OM-2, SVG to right PDA, SVG to D-2) with post-op AF, carotid stenosis s/p L CEA and right subclavian stenosis, diet-controlled DM, HTN, obesity, OSA on CPAP, mild cognitive impairment, depression/mood disorder, and hypothyroidism presented for epigastric pain evaluation.    Assessment & Plan    1. CAD s/p prior stenting and CABG - Plan L & R cath today given reduce LV function and MR.  - Continue ASA and statin   2. New onset chronic combined CHF - Echo showed LVEF of EF 35-40% with severe HK of the  posterior wall. Moderate to severe MR related with eccentric jet that is  poorly quantitated.  - Mild volume overload.  - Diuresed 946cc - Add heart failure regimen post cath  3. Mitral regurgitations - rule out ischemic etiology - Likely TEE tomorrow  4. New onset atrial fibrillation - Rate controlled. Not any rate control  agent - CHADSVASc score of 5 - On heparin for anticoagulation>> switch to Eliquis prior to discharge   5. HLD - 09/08/2019: Cholesterol 135; HDL 42; LDL Cholesterol 76; Triglycerides 85; VLDL 17 - Consider changing Zocor to Lipitor/Crestor pending cath - On Zetia   6. DM - Per primary team  7. HTN - BP stable currently without antihypertensive    For questions or updates, please contact [place] Please consult [email or web address] for contact info under  Jarrett Soho, PA  09/09/2019, 8:31 AM

## 2019-09-09 NOTE — Progress Notes (Signed)
Right femoral artery and venous sheaths removed separately and pressure held for 20 minutes. Right distal pedal pulse is palpable. Groin is level 0 and downtime for bedrest begins at 1500. There have been no apparent complications.

## 2019-09-09 NOTE — Progress Notes (Signed)
[facility] PROGRESS NOTE    Progress Note  Raymond Christensen  GBT:[id number] DOB: 09/18/1940 DOA: 09/07/2019 PCP: [name], DO     Brief Narrative:   Raymond Christensen is an 79 y.o. male past medical history of essential hypertension, hyperlipidemia diet-controlled diabetes mellitus type 2 CAD and peripheral vascular disease status post left carotid endarterectomy morbid obesity obstructive sleep apnea comes in with epigastric abdominal pain that started 1 week prior to admission.  He tachycardic and tachypneic with a low potassium troponin I 300 and EKG A. fib was started on IV heparin  Assessment/Plan:   NSTEMI (non-ST elevated myocardial infarction) ([facility]); 2D echo showed an EF of 35% with severe hypokinesia of the posterior wall. Admitted to telemetry, on IV heparin, aspirin, statins and nitroglycerin. Cardiology was consulted recommended diagnostic cardiac cath on 09/09/2019. Keep the patient n.p.o.  Paroxysmal atrial fibrillation: Patient has a prior history of postoperative atrial fibrillation following CABG with no recurrent episodes since then.   Now with new onset atrial fibrillation with a chads vas score of at least 5, he is on IV heparin, will need oral anticoagulation as an outpatient. Try to keep potassium greater than 4 magnesium greater than 2.  CAD: Continue statins.  Essential hypertension: Holding antihypertensive medication, blood pressure seems to be stable.  Diabetes mellitus type 2: With an A1c of 6.6 continue carb modified diet after cardiac cath.  Hypothyroidism: Continue Synthroid.  Chronic thrombocytopenia: No signs of bleeding. See to chroni compare to 2019.   Hypothyroidism   Obstructive sleep apnea on CPAP   Diabetes mellitus type 2 in obese (HCC)   Elevated troponin   Thrombocytopenia (HCC)   PAF (paroxysmal atrial fibrillation) (HCC)   CAD (coronary artery disease) of artery bypass graft   DVT prophylaxis: lovenox Family  Communication:none Status is: Inpatient  Remains inpatient appropriate because:Hemodynamically unstable   Dispo: The patient is from: Home              Anticipated d/c is to: Home              Anticipated d/c date is: 2 days              Patient currently is not medically stable to d/c.        Code Status:     Code Status Orders  (From admission, onward)         Start     Ordered   09/08/19 1227  Full code  Continuous        09/08/19 1229        Code Status History    This patient has a current code status but no historical code status.   Advance Care Planning Activity        IV Access:    Peripheral IV   Procedures and diagnostic studies:   DG Chest 2 View  Result Date: 09/07/2019 CLINICAL DATA:  Cough EXAM: CHEST - 2 VIEW COMPARISON:  None. FINDINGS: Cardiac shadow is enlarged. Postsurgical changes are noted. The lungs are clear. Degenerative changes of the thoracic spine are noted. IMPRESSION: No active cardiopulmonary disease. Electronically Signed   By: Inez Catalina M.D.   On: 09/07/2019 17:32   ECHOCARDIOGRAM COMPLETE  Result Date: 09/08/2019    ECHOCARDIOGRAM REPORT   Patient Name:   Raymond Christensen Date of Exam: 09/08/2019 Medical Rec #:  [id number]      Height:       68.0 in Accession #:    [id number]  Weight:       238.5 lb Date of Birth:  1940-09-01      BSA:          2.203 m Patient Age:    44 years       BP:           100/85 mmHg Patient Gender: M              HR:           97 bpm. Exam Location:  Inpatient Procedure: 2D Echo and Intracardiac Opacification Agent Indications:    NSTEMI I21.4  History:        Patient has no prior history of Echocardiogram examinations.                 CAD; Risk Factors:Dyslipidemia and Diabetes.  Sonographer:    Mikki Santee RDCS (AE) Referring Phys: [id number] RONDELL A SMITH IMPRESSIONS  1. Poor quality study despite contrast. EF 35-40% with severe HK of the posterior wall. Moderate to severe MR related with  eccentric jet that is poorly quantitated. Would strongly consider TEE.  2. Left ventricular ejection fraction, by estimation, is 35 to 40%. The left ventricle has moderately decreased function. The left ventricle demonstrates regional wall motion abnormalities (see scoring diagram/findings for description). Left ventricular  diastolic function could not be evaluated.  3. Right ventricular systolic function is normal. The right ventricular size is normal. There is normal pulmonary artery systolic pressure. The estimated right ventricular systolic pressure is 39.7 mmHg.  4. Left atrial size was moderately dilated.  5. Right atrial size was mildly dilated.  6. There is moderate to severe MR that is poorly quantitated, posteriorly directed, and eccentric. Suspect this is related to restricted movement of the PMVL is systole due to posterior wall hypokinesis (IIIB pathology). There were poor acoustic windows  for TTE as well. Would consider a TEE for better evaluation of the pathology if clinically indicated. The mitral valve is abnormal. Moderate to severe mitral valve regurgitation. No evidence of mitral stenosis.  7. The aortic valve is tricuspid. Aortic valve regurgitation is not visualized. Mild aortic valve sclerosis is present, with no evidence of aortic valve stenosis.  8. The inferior vena cava is normal in size with greater than 50% respiratory variability, suggesting right atrial pressure of 3 mmHg. FINDINGS  Left Ventricle: Left ventricular ejection fraction, by estimation, is 35 to 40%. The left ventricle has moderately decreased function. The left ventricle demonstrates regional wall motion abnormalities. Definity contrast agent was given IV to delineate the left ventricular endocardial borders. The left ventricular internal cavity size was normal in size. There is no left ventricular hypertrophy. Abnormal (paradoxical) septal motion consistent with post-operative status. Left ventricular diastolic function  could not be evaluated due to atrial fibrillation. Left ventricular diastolic function could not be evaluated.  LV Wall Scoring: The posterior wall is hypokinetic. Right Ventricle: The right ventricular size is normal. No increase in right ventricular wall thickness. Right ventricular systolic function is normal. There is normal pulmonary artery systolic pressure. The tricuspid regurgitant velocity is 2.40 m/s, and  with an assumed right atrial pressure of 3 mmHg, the estimated right ventricular systolic pressure is 67.3 mmHg. Left Atrium: Left atrial size was moderately dilated. Right Atrium: Right atrial size was mildly dilated. Pericardium: Trivial pericardial effusion is present. Mitral Valve: There is moderate to severe MR that is poorly quantitated, posteriorly directed, and eccentric. Suspect this is related to restricted movement of the PMVL is  systole due to posterior wall hypokinesis (IIIB pathology). There were poor acoustic windows for TTE as well. Would consider a TEE for better evaluation of the pathology if clinically indicated. The mitral valve is abnormal. Moderate to severe mitral valve regurgitation. No evidence of mitral valve stenosis. Tricuspid Valve: The tricuspid valve is grossly normal. Tricuspid valve regurgitation is mild . No evidence of tricuspid stenosis. Aortic Valve: The aortic valve is tricuspid. Aortic valve regurgitation is not visualized. Mild aortic valve sclerosis is present, with no evidence of aortic valve stenosis. Pulmonic Valve: The pulmonic valve was grossly normal. Pulmonic valve regurgitation is trivial. No evidence of pulmonic stenosis. Aorta: The aortic root and ascending aorta are structurally normal, with no evidence of dilitation. Venous: The inferior vena cava is normal in size with greater than 50% respiratory variability, suggesting right atrial pressure of 3 mmHg. IAS/Shunts: The atrial septum is grossly normal.  LEFT VENTRICLE PLAX 2D LVIDd:         5.08 cm  LVIDs:         3.36 cm LV PW:         0.88 cm LV IVS:        1.18 cm LVOT diam:     2.30 cm LV SV:         47 LV SV Index:   21 LVOT Area:     4.15 cm  RIGHT VENTRICLE RV S prime:     8.27 cm/s TAPSE (M-mode): 1.6 cm LEFT ATRIUM              Index       RIGHT ATRIUM           Index LA diam:        5.00 cm  2.27 cm/m  RA Area:     23.50 cm LA Vol (A2C):   96.9 ml  43.99 ml/m RA Volume:   63.30 ml  28.74 ml/m LA Vol (A4C):   130.0 ml 59.02 ml/m LA Biplane Vol: 116.0 ml 52.66 ml/m  AORTIC VALVE LVOT Vmax:   61.30 cm/s LVOT Vmean:  38.567 cm/s LVOT VTI:    0.112 m  AORTA Ao Root diam: 3.30 cm MR Peak grad: 97.8 mmHg   TRICUSPID VALVE MR Mean grad: 68.5 mmHg   TR Peak grad:   23.0 mmHg MR Vmax:      494.50 cm/s TR Vmax:        240.00 cm/s MR Vmean:     398.0 cm/s                           SHUNTS                           Systemic VTI:  0.11 m                           Systemic Diam: 2.30 cm Eleonore Chiquito MD Electronically signed by Eleonore Chiquito MD Signature Date/Time: 09/08/2019/3:34:47 PM    Final      Medical Consultants:    None.  Anti-Infectives:   none  Subjective:    Raymond Christensen denies any chest pain or shortness of breath.  Objective:    Vitals:   09/08/19 1056 09/08/19 2026 09/09/19 0152 09/09/19 0629  BP: 100/85 140/85 131/88 135/79  Pulse: 82 75 76 82  Resp: 16 18 17 17   Temp: 98 F (  36.7 C) 97.8 F (36.6 C) (!) 97.5 F (36.4 C) 97.9 F (36.6 C)  TempSrc: Oral Oral Oral Oral  SpO2: 97% 99% 98% 98%  Weight: 108.2 kg  107.5 kg   Height: 5\' 8"  (1.727 m)      SpO2: 98 %   Intake/Output Summary (Last 24 hours) at 09/09/2019 0708 Last data filed at 09/09/2019 6789 Gross per 24 hour  Intake 853.37 ml  Output 1800 ml  Net -946.63 ml   Filed Weights   09/07/19 2225 09/08/19 1056 09/09/19 0152  Weight: 108.9 kg 108.2 kg 107.5 kg    Exam: General exam: In no acute distress. Respiratory system: Good air movement and clear to auscultation. Cardiovascular system:  S1 & S2 heard, RRR. No JVD. Gastrointestinal system: Abdomen is nondistended, soft and nontender.  Extremities: No pedal edema. Skin: No rashes, lesions or ulcers Psychiatry: Judgement and insight appear normal. Mood & affect appropriate.    Data Reviewed:    Labs: Basic Metabolic Panel: Recent Labs  Lab 09/07/19 2239 09/08/19 1254  NA 138 140  K 3.2* 3.8  CL 102 103  CO2 27 27  GLUCOSE 148* 133*  BUN 24* 16  CREATININE 0.92 0.92  CALCIUM 8.9 9.3   GFR Estimated Creatinine Clearance: 79.9 mL/min (by C-G formula based on SCr of 0.92 mg/dL). Liver Function Tests: Recent Labs  Lab 09/07/19 2239  AST 31  ALT 34  ALKPHOS 54  BILITOT <0.1*  PROT 6.9  ALBUMIN 3.8   Recent Labs  Lab 09/07/19 1621 09/07/19 2239  LIPASE 12 26   No results for input(s): AMMONIA in the last 168 hours. Coagulation profile No results for input(s): INR, PROTIME in the last 168 hours. COVID-19 Labs  No results for input(s): DDIMER, FERRITIN, LDH, CRP in the last 72 hours.  Lab Results  Component Value Date   [place] NEGATIVE 09/08/2019    CBC: Recent Labs  Lab 09/07/19 1621 09/07/19 2239 09/08/19 1254  WBC 9.0 9.0 9.4  NEUTROABS 5,715 5.5  --   HGB 13.5 13.5 13.6  HCT 40.6 41.2 42.2  MCV 87.9 89.2 89.4  PLT 150 143* 142*   Cardiac Enzymes: Recent Labs  Lab 09/07/19 1621  TROPONINI 514*   BNP (last 3 results) No results for input(s): PROBNP in the last 8760 hours. CBG: Recent Labs  Lab 09/08/19 1239  GLUCAP 134*   D-Dimer: No results for input(s): DDIMER in the last 72 hours. Hgb A1c: No results for input(s): HGBA1C in the last 72 hours. Lipid Profile: Recent Labs    09/08/19 1254  CHOL 135  HDL 42  LDLCALC 76  TRIG 85  CHOLHDL 3.2   Thyroid function studies: No results for input(s): TSH, T4TOTAL, T3FREE, THYROIDAB in the last 72 hours.  Invalid input(s): FREET3 Anemia work up: Recent Labs    09/07/19 1621  VITAMINB12 1,659*  IRON 51    Sepsis Labs: Recent Labs  Lab 09/07/19 1621 09/07/19 2239 09/08/19 1254  WBC 9.0 9.0 9.4   Microbiology Recent Results (from the past 240 hour(s))  SARS Coronavirus 2 by RT PCR (hospital order, performed in [facility] hospital lab) Nasopharyngeal Nasopharyngeal Swab     Status: None   Collection Time: 09/08/19  3:20 AM   Specimen: Nasopharyngeal Swab  Result Value Ref Range Status   SARS Coronavirus 2 NEGATIVE NEGATIVE Final    Comment: (NOTE) SARS-CoV-2 target nucleic acids are NOT DETECTED.  The SARS-CoV-2 RNA is generally detectable in upper and lower respiratory specimens  during the acute phase of infection. The lowest concentration of SARS-CoV-2 viral copies this assay can detect is 250 copies / mL. A negative result does not preclude SARS-CoV-2 infection and should not be used as the sole basis for treatment or other patient management decisions.  A negative result may occur with improper specimen collection / handling, submission of specimen other than nasopharyngeal swab, presence of viral mutation(s) within the areas targeted by this assay, and inadequate number of viral copies (<250 copies / mL). A negative result must be combined with clinical observations, patient history, and epidemiological information.  Fact Sheet for Patients:   StrictlyIdeas.no  Fact Sheet for Healthcare Providers: BankingDealers.co.za  This test is not yet approved or  cleared by the [place] FDA and has been authorized for detection and/or diagnosis of SARS-CoV-2 by FDA under an Emergency Use Authorization (EUA).  This EUA will remain in effect (meaning this test can be used) for the duration of the COVID-19 declaration under Section 564(b)(1) of the Act, 21 U.S.C. section 360bbb-3(b)(1), unless the authorization is terminated or revoked sooner.  Performed at [facility], [address] [place], [place] [address]       Medications:   . aspirin EC  81 mg Oral QHS  . ezetimibe  10 mg Oral Daily  . finasteride  5 mg Oral Daily  . levothyroxine  75 mcg Oral QAC breakfast  . liothyronine  10 mcg Oral Daily  . loratadine  10 mg Oral Daily  . simvastatin  40 mg Oral QHS  . sodium chloride flush  3 mL Intravenous Q12H  . tamsulosin  0.4 mg Oral QHS   Continuous Infusions: . sodium chloride    . sodium chloride 10 mL/hr at 09/09/19 0635  . heparin 1,400 Units/hr (09/08/19 1839)      LOS: 1 day   Charlynne Cousins  [facility]  09/09/2019, 7:08 AM

## 2019-09-09 NOTE — Progress Notes (Addendum)
   The patient is currently in the Cath Lab.  He had a good night.  He was evaluated prior to cath.  He is able to lie flat.  An interval development overnight is Podagra in his left foot.  We should institute colchicine.  Systolic murmur with radiation into the left axilla is clearly audible.  Further recommendations will be pending the findings at cath.  Full note to follow per Dr. Johnney Ou.

## 2019-09-09 NOTE — TOC Benefit Eligibility Note (Signed)
Transition of Care [facility]) Benefit Eligibility Note    Patient Details  Name: Raymond Christensen MRN: [id number] Date of Birth: 1940-12-02   Medication/Dose: Arne [place]  5 MG BID  Covered?: Yes  Tier:  (PREFERRED)  Prescription Coverage [facility]  Spoke with Person/Company/Phone Number:: [place]   @ [facility] [employer] RX # [phone]  OPT- MEMBER  Co-Pay: $5.00     Deductible: Met  Additional Notes: 90 DAY SUPPLY FOR M/O $ 10.00   OR  90 DAY SUPPLY FOR RETAIL $15.00    Memory Argue Phone Number: 09/09/2019, 4:08 PM

## 2019-09-09 NOTE — Plan of Care (Signed)
  Problem: Activity: Goal: Risk for activity intolerance will decrease Outcome: Progressing   Problem: Coping: Goal: Level of anxiety will decrease Outcome: Progressing   

## 2019-09-09 NOTE — Progress Notes (Signed)
The patient has been seen in conjunction with Sanjuana Letters, DO. All aspects of care have been considered and discussed. The patient has been personally interviewed, examined, and all clinical data has been reviewed.   Bypass failure with high grade stenosis in seq graft to OM Diag, and PDA. Successful therapy with mid and distal graft body stents. Asa and Plavix x 30 days then drop ASA.  Afib with controlled rate. Ambulate to evaluate rate control. If poor control , may need TEE guided electrical cardioversion tomorrow or Monday. Start Eliquis in AM since femoral cath. Consider starting Amiodarone after initiation of beta blocker if HR will allow.  Systolic heart failure, uncertain duration: Needs guideline directed therapy. Start low dose Entresto, carvedilol, and Aldactone.  Mitral regurgitation reassessment with TEE, perhaps at time of cardioversion.    Raymond Christensen is a 79 y.o. male with a history of MI in 1994 c/b cardiac arrest s/p lytics and PTCA, CABG in 2001 (LIMA to LAD, SVG to OM-2, SVG to right PDA, SVG to D-2) with post-op AF, carotid stenosis s/p L CEA and right subclavian stenosis, diet-controlled DM, HTN, obesity, OSA on CPAP, mild cognitive impairment, depression/mood disorder, and hypothyroidism. LV function seems to be preserved based on available records, EF of 50-55% in 2010. Patient was sent to the ED on 09/08/19 from his PCP's office after being seen for one week of epigastric discomfort and found to have afib and elevated troponin's.   Subjective:   Raymond Christensen is laying in bed comfortably, in no discomfort. He notes one episode of upper abdominal discomfort after taking one of his medications at around approximately 0400-0500 early this morning. He denies the feeling being painful, but describes it as a dullness. He notes this is similar to the pain he had prior to his admission. He states it improved with belching.   He denies any other overnight acute events. He  denies shortness of breath, chest pain, fever, or chills. He notes not eating after midnight due to his NPO status, but was able tolerating PO fluids and food prior to that. He did not sleep with his CPAP last night. He is not having difficulty passing his bowels or urinating. He has no other complaints at the time of my examination.   Patient to have diagnostic catheterization performed today by Dr. Gwenlyn Found.   Upon discussing patient's case with Dr. Tamala Julian, patient complained of toe discomfort, will evaluate after patient returns from cath lab.   Objective:  Vital Signs in the last 24 hours: Temp:  [97.5 F (36.4 C)-97.9 F (36.6 C)] 97.7 F (36.5 C) (08/26 0854) Pulse Rate:  [0-108] 73 (08/26 1200) Resp:  [0-34] 25 (08/26 1200) BP: (117-158)/(61-95) 139/61 (08/26 1200) SpO2:  [0 %-100 %] 96 % (08/26 1200) Weight:  [107.5 kg] 107.5 kg (08/26 0152)  Intake/Output from previous day: 08/25 0701 - 08/26 0700 In: 853.4 [P.O.:480; I.V.:373.4] Out: 1800 [Urine:1800] Intake/Output from this shift: Total I/O In: -  Out: 200 [Urine:200]  Physical Exam: Physical Exam Vitals and nursing note reviewed.  Constitutional:      General: He is not in acute distress.    Appearance: Normal appearance. He is not ill-appearing, toxic-appearing or diaphoretic.  HENT:     Head: Normocephalic and atraumatic.  Eyes:     Extraocular Movements: Extraocular movements intact.  Cardiovascular:     Rate and Rhythm: Normal rate. Rhythm irregular.     Pulses: Normal pulses.     Heart sounds: Murmur (systolic) heard.  No friction rub. No gallop.   Pulmonary:     Effort: No respiratory distress.     Breath sounds: No stridor. No wheezing, rhonchi or rales.  Abdominal:     Palpations: Abdomen is soft.     Tenderness: There is abdominal tenderness (upper abdominal tenderness). There is no guarding or rebound.  Musculoskeletal:     Cervical back: Normal range of motion.     Right lower leg: Edema (trace)  present.     Left lower leg: Edema (trace) present.  Skin:    Comments: Left 1st metatarsal with erythema, warmth, and tenderness.   Neurological:     General: No focal deficit present.     Mental Status: He is alert and oriented to person, place, and time.  Psychiatric:        Mood and Affect: Mood normal.        Behavior: Behavior normal.    Lab Results: Recent Labs    09/08/19 1254 09/09/19 0707  WBC 9.4 10.5  HGB 13.6 13.3  PLT 142* 134*   Recent Labs    09/08/19 1254 09/09/19 0707  NA 140 139  K 3.8 3.6  CL 103 101  CO2 27 27  GLUCOSE 133* 139*  BUN 16 14  CREATININE 0.92 1.01   Recent Labs    09/07/19 1621  TROPONINI 514*   Hepatic Function Panel Recent Labs    09/07/19 2239  PROT 6.9  ALBUMIN 3.8  AST 31  ALT 34  ALKPHOS 54  BILITOT <0.1*  BILIDIR 0.1  IBILI NOT CALCULATED   Recent Labs    09/08/19 1254  CHOL 135   Cardiac Studies:  Cardiac Catheterization  Mid LM to Dist LM lesion is 70% stenosed.  1st Mrg lesion is 75% stenosed.  2nd Mrg-1 lesion is 90% stenosed.  2nd Mrg-2 lesion is 95% stenosed.  Mid RCA to Dist RCA lesion is 100% stenosed.  Prox RCA to Mid RCA lesion is 90% stenosed.  2nd Diag lesion is 75% stenosed.  Origin to Prox Graft lesion between 2nd Mrg and RPDA is 99% stenosed.  Prox Graft to Mid Graft lesion between Ramus and 2nd Mrg is 75% stenosed.  A drug-eluting stent was successfully placed using a STENT RESOLUTE ONYX 3.0X18.  Post intervention, there is a 0% residual stenosis.  A drug-eluting stent was successfully placed.  Post intervention, there is a 0% residual stenosis.  Hemodynamic findings consistent with mitral valve regurgitation.  Impression: Raymond Christensen has an occluded RCA with an occluded second diagonal branch vein graft at the aorta and high-grade disease in a sequential vein graft supplying the second obtuse marginal branch and PDA.  His filling pressures are fairly low.  I placed 2  sequential drug-eluting stents with excellent result.  It is unclear whether his MR is structural or functional but I suspect he may require a transesophageal echo to further evaluate.  The sheath will be removed once the ACT falls below 170 and pressure held.  He will be gently hydrated.  He will need to be treated with an oral anticoagulant for his A. fib as well as aspirin and Plavix for 30 days after which the aspirin can be discontinued. He will need at least 12 months of Plavix and long-term oral anticoagulation.  Assessment/Plan:   Raymond Christensen is a 79 y.o. male with a history of MI in 1994 c/b cardiac arrest s/p lytics and PTCA, CABG in 2001 (LIMA to LAD, SVG to OM-2, SVG to right  PDA, SVG to D-2) with post-op AF, carotid stenosis s/p L CEA and right subclavian stenosis, diet-controlled DM, HTN, obesity, OSA on CPAP, mild cognitive impairment, depression/mood disorder, and hypothyroidism. LV function seems to be preserved based on available records, EF of 50-55% in 2010. Patient was sent to the ED on 09/08/19 from his PCP's office after being seen for one week of epigastric discomfort and found to have afib and elevated troponin's. 2D echo revealed decline in EF, 35-40% with moderate to severe MR. He was admitted to cardiology services for diagnostic catheterization and further evaluation.   CAD with history of CABG 20 years ago - Diagnostic catheterization performed today, occluded RCA with occluded 2nd diagnoal branch vein graft at aorta and high grade disease in sequential vein graft supplying the second obtuse marginal branch/PDA - 2 drug eluting stents were placed with 0% residual stenosis. - Per Dr. Kennon Holter recommendation, will continue aspirin and plavix for 30 days along with one year of plavix and long term anti-coagulation, potentially apixaban. Will start medications while admitted. - Will continue simvastatin 40 mg QD - Will monitor catheterization site for any signs of complications  such as infection, hematoma, pseudoaneurysm, bleeding.  - Appreciate Dr. Kennon Holter assistance in the patient's care.   Acute Combined Systolic and Diastolic Heart Failure - Mixed etiology suspected; bypass graft failure as per catheterization and hemodynamic burden of MR. - Suspect new onset MR related to patient's ischemia.  - TEE will be needed for further evaluation of patient's new findings with potential need for MV repair or Mitra clip placement.  - An additoinal ECHO will need to be performed at a later day to further evaluate cardiac function post stent placement.  New Atrial Fibrillation Of Unknown Duration - Ischemic changes as well as new onset HF may be contributing to patient's new AFIB. - Patient with two episodes of tachycardia overnight, seen on telemetry monitor. Continues to be in rate controlled otherwise - Patient currently rate controlled, will consider rhythm control as well. - Afib may be contributing to patient's decreased EF, will be further evaluated with TEE. - CHADS2VASc score of 5, 7,2% annual risk of stroke. - IV heparin given prior to diagnostic catheterization, will continue with apixaban post operatively.  Hyperlipidemia - LDL of 76 - Continue simvastatin 40 mg QD  Upper Abdominal Discomfort - Patient continues to have upper abdominal discomfort, predominantly post-prandially. - Suspected etiology of patient's occluded grafts, will reevaluate post catheterization to see if improved.   Obstructive Sleep Apnea - History of OSA, with compliance of CPAP at home. - Will order CPAP  Acute Gout Flair  - Patient with warmth, erythema, and tenderness of left big toe.  - Will treat with colchicine, will refrain from glucocorticoids at this time as patient is post-op from catheterization and it may delay wound healing.   Code Status: Full Code  Sanjuana Letters 09/09/2019, 1:47 PM PGY-1 Resident - Internal Medicine  Cardiology Attending: Dr. [name],  MD

## 2019-09-09 NOTE — Plan of Care (Signed)
  Problem: Education: Goal: Knowledge of General Education information will improve Description: Including pain rating scale, medication(s)/side effects and non-pharmacologic comfort measures Outcome: Progressing   Problem: Clinical Measurements: Goal: Ability to maintain clinical measurements within normal limits will improve Outcome: Progressing Goal: Cardiovascular complication will be avoided Outcome: Progressing   

## 2019-09-09 NOTE — Interval H&P Note (Signed)
Cath Lab Visit (complete for each Cath Lab visit)  Clinical Evaluation Leading to the Procedure:   ACS: Yes.    Non-ACS:    Anginal Classification: CCS II  Anti-ischemic medical therapy: No Therapy  Non-Invasive Test Results: No non-invasive testing performed  Prior CABG: Previous CABG      History and Physical Interval Note:  09/09/2019 9:42 AM  Raymond Christensen  has presented today for surgery, with the diagnosis of chf - angina - mr.  The various methods of treatment have been discussed with the patient and family. After consideration of risks, benefits and other options for treatment, the patient has consented to  Procedure(s): RIGHT/LEFT HEART CATH AND CORONARY/GRAFT ANGIOGRAPHY (N/A) as a surgical intervention.  The patient's history has been reviewed, patient examined, no change in status, stable for surgery.  I have reviewed the patient's chart and labs.  Questions were answered to the patient's satisfaction.     Quay Burow

## 2019-09-10 ENCOUNTER — Encounter (HOSPITAL_COMMUNITY): Payer: Self-pay | Admitting: Cardiovascular Disease

## 2019-09-10 ENCOUNTER — Telehealth: Payer: Self-pay | Admitting: Family Medicine

## 2019-09-10 ENCOUNTER — Ambulatory Visit: Payer: Medicare (Managed Care) | Admitting: Psychology

## 2019-09-10 ENCOUNTER — Inpatient Hospital Stay (HOSPITAL_COMMUNITY): Payer: Medicare (Managed Care)

## 2019-09-10 LAB — CBC
HCT: 39.7 % (ref 39.0–52.0)
Hemoglobin: 13.2 g/dL (ref 13.0–17.0)
MCH: 29.3 pg (ref 26.0–34.0)
MCHC: 33.2 g/dL (ref 30.0–36.0)
MCV: 88.2 fL (ref 80.0–100.0)
Platelets: 129 10*3/uL — ABNORMAL LOW (ref 150–400)
RBC: 4.5 MIL/uL (ref 4.22–5.81)
RDW: 13.6 % (ref 11.5–15.5)
WBC: 8.8 10*3/uL (ref 4.0–10.5)
nRBC: 0 % (ref 0.0–0.2)

## 2019-09-10 LAB — BASIC METABOLIC PANEL
Anion gap: 8 (ref 5–15)
BUN: 11 mg/dL (ref 8–23)
CO2: 25 mmol/L (ref 22–32)
Calcium: 8.9 mg/dL (ref 8.9–10.3)
Chloride: 104 mmol/L (ref 98–111)
Creatinine, Ser: 0.92 mg/dL (ref 0.61–1.24)
GFR calc Af Amer: >60 mL/min (ref >60)
GFR calc non Af Amer: >60 mL/min (ref >60)
Glucose, Bld: 125 mg/dL — ABNORMAL HIGH (ref 70–99)
Potassium: 3.6 mmol/L (ref 3.5–5.1)
Sodium: 137 mmol/L (ref 135–145)

## 2019-09-10 LAB — CBC WITH DIFFERENTIAL/PLATELET
Absolute Monocytes: 882 cells/uL (ref 200–950)
Basophils Absolute: 18 cells/uL (ref 0–200)
Basophils Relative: 0.2 %
Eosinophils Absolute: 72 cells/uL (ref 15–500)
Eosinophils Relative: 0.8 %
HCT: 40.6 % (ref 38.5–50.0)
Hemoglobin: 13.5 g/dL (ref 13.2–17.1)
Lymphs Abs: 2313 cells/uL (ref 850–3900)
MCH: 29.2 pg (ref 27.0–33.0)
MCHC: 33.3 g/dL (ref 32.0–36.0)
MCV: 87.9 fL (ref 80.0–100.0)
MPV: 12.1 fL (ref 7.5–12.5)
Monocytes Relative: 9.8 %
Neutro Abs: 5715 cells/uL (ref 1500–7800)
Neutrophils Relative %: 63.5 %
Platelets: 150 10*3/uL (ref 140–400)
RBC: 4.62 10*6/uL (ref 4.20–5.80)
RDW: 13.3 % (ref 11.0–15.0)
Total Lymphocyte: 25.7 %
WBC: 9 10*3/uL (ref 3.8–10.8)

## 2019-09-10 LAB — VITAMIN B12: Vitamin B-12: 1659 pg/mL — ABNORMAL HIGH (ref 200–1100)

## 2019-09-10 LAB — VITAMIN B1: Vitamin B1 (Thiamine): 13 nmol/L (ref 8–30)

## 2019-09-10 LAB — IRON: Iron: 51 ug/dL (ref 50–180)

## 2019-09-10 LAB — LIPASE: Lipase: 12 U/L (ref 7–60)

## 2019-09-10 LAB — TROPONIN I: Troponin I: 514 ng/L (ref <=47)

## 2019-09-10 MED ORDER — COLCHICINE 0.3 MG HALF TABLET
0.3000 mg | ORAL_TABLET | Freq: Two times a day (BID) | ORAL | Status: DC
  Administered 2019-09-10 (×2): 0.3 mg via ORAL
  Filled 2019-09-10 (×3): qty 1

## 2019-09-10 MED ORDER — POTASSIUM CHLORIDE CRYS ER 20 MEQ PO TBCR
40.0000 meq | EXTENDED_RELEASE_TABLET | Freq: Once | ORAL | Status: AC
  Administered 2019-09-10: 40 meq via ORAL
  Filled 2019-09-10: qty 4

## 2019-09-10 MED ORDER — AMIODARONE HCL 200 MG PO TABS
200.0000 mg | ORAL_TABLET | Freq: Two times a day (BID) | ORAL | Status: DC
  Administered 2019-09-10 – 2019-09-14 (×9): 200 mg via ORAL
  Filled 2019-09-10 (×9): qty 1

## 2019-09-10 MED ORDER — SODIUM CHLORIDE 0.9 % IV SOLN: INTRAVENOUS | Status: DC

## 2019-09-10 MED ORDER — ATORVASTATIN CALCIUM 80 MG PO TABS
80.0000 mg | ORAL_TABLET | Freq: Every day | ORAL | Status: DC
  Administered 2019-09-10 – 2019-09-14 (×5): 80 mg via ORAL
  Filled 2019-09-10 (×5): qty 1

## 2019-09-10 MED ORDER — CARVEDILOL 6.25 MG PO TABS
6.2500 mg | ORAL_TABLET | Freq: Two times a day (BID) | ORAL | Status: DC
  Administered 2019-09-10 – 2019-09-14 (×8): 6.25 mg via ORAL
  Filled 2019-09-10 (×8): qty 1

## 2019-09-10 MED FILL — Verapamil HCl IV Soln 2.5 MG/ML: INTRAVENOUS | Qty: 2 | Status: AC

## 2019-09-10 MED FILL — Nitroglycerin IV Soln 100 MCG/ML in D5W: INTRA_ARTERIAL | Qty: 10 | Status: AC

## 2019-09-10 NOTE — Progress Notes (Addendum)
Progress Note  Patient Name: Raymond Christensen Date of Encounter: 09/10/2019  [facility] Cardiologist: No primary care provider on file. Mendel Ryder or Erlene Quan  Subjective   Walked with cardiac rehab this morning. Breathless and fatigued as prior to admission. Since PCI, lower chest tightness has resolved.  Heart rate this morning after ambulating was 125 bpm.  Still bothered by left great toe pain and swelling.  Inpatient Medications    Scheduled Meds: . apixaban  5 mg Oral BID  . aspirin EC  81 mg Oral QHS  . atorvastatin  80 mg Oral Daily  . carvedilol  3.125 mg Oral BID WC  . clopidogrel  75 mg Oral Q breakfast  . colchicine  0.3 mg Oral BID  . ezetimibe  10 mg Oral Daily  . finasteride  5 mg Oral Daily  . levothyroxine  75 mcg Oral QAC breakfast  . liothyronine  10 mcg Oral Daily  . loratadine  10 mg Oral Daily  . sacubitril-valsartan  1 tablet Oral BID  . sodium chloride flush  3 mL Intravenous Q12H  . spironolactone  12.5 mg Oral Daily  . tamsulosin  0.4 mg Oral QHS   Continuous Infusions: . sodium chloride    . sodium chloride 20 mL/hr at 09/10/19 1130   PRN Meds: sodium chloride, acetaminophen, fluticasone, morphine injection, nitroGLYCERIN, ondansetron (ZOFRAN) IV, sodium chloride flush   Vital Signs    Vitals:   09/10/19 0839 09/10/19 0841 09/10/19 1054 09/10/19 1133  BP: (!) 143/75 135/70 135/70 115/77  Pulse: (!) 114 99 99 92  Resp:  20 20 16   Temp:  97.8 F (36.6 C) 97.8 F (36.6 C) 97.9 F (36.6 C)  TempSrc:   Oral Oral  SpO2: 94%  95% 94%  Weight:      Height:        Intake/Output Summary (Last 24 hours) at 09/10/2019 1242 Last data filed at 09/10/2019 0843 Gross per 24 hour  Intake 828 ml  Output 1325 ml  Net -497 ml   Last 3 Weights 09/10/2019 09/09/2019 09/08/2019  Weight (lbs) 234 lb 236 lb 14.4 oz 238 lb 8.6 oz  Weight (kg) 106.142 kg 107.457 kg 108.2 kg      Telemetry    Atrial fib with heart rates up to the 140s with  ambulation.- Personally Reviewed  ECG    Atrial fibrillation with rate control at 4:59 AM.- Personally Reviewed  Physical Exam  Obese. GEN: No acute distress.   Neck: No JVD Cardiac: Irregularly irregular, no murmurs, rubs, or gallops.  Respiratory: Clear to auscultation bilaterally. GI: Soft, nontender, non-distended  MS: No edema; No deformity. Neuro:  Nonfocal  Psych: Normal affect   Labs    High Sensitivity Troponin:   Recent Labs  Lab 09/07/19 2240 09/08/19 0042  TROPONINIHS 314* 304*      Chemistry Recent Labs  Lab 09/07/19 2239 09/07/19 2239 09/08/19 1254 09/08/19 1254 09/09/19 0707 09/09/19 0959 09/09/19 1001 09/09/19 1002 09/10/19 0418  NA 138   < > 140   < > 139   < > 141 140 137  K 3.2*   < > 3.8   < > 3.6   < > 3.6 3.6 3.6  CL 102   < > 103  --  101  --   --   --  104  CO2 27   < > 27  --  27  --   --   --  25  GLUCOSE 148*   < >  133*  --  139*  --   --   --  125*  BUN 24*   < > 16  --  14  --   --   --  11  CREATININE 0.92   < > 0.92  --  1.01  --   --   --  0.92  CALCIUM 8.9   < > 9.3  --  9.1  --   --   --  8.9  PROT 6.9  --   --   --   --   --   --   --   --   ALBUMIN 3.8  --   --   --   --   --   --   --   --   AST 31  --   --   --   --   --   --   --   --   ALT 34  --   --   --   --   --   --   --   --   ALKPHOS 54  --   --   --   --   --   --   --   --   BILITOT <0.1*  --   --   --   --   --   --   --   --   GFRNONAA >60   < > >60  --  >60  --   --   --  >60  GFRAA >60   < > >60  --  >60  --   --   --  >60  ANIONGAP 9   < > 10  --  11  --   --   --  8   < > = values in this interval not displayed.     Hematology Recent Labs  Lab 09/08/19 1254 09/08/19 1254 09/09/19 0707 09/09/19 0959 09/09/19 1001 09/09/19 1002 09/10/19 0418  WBC 9.4  --  10.5  --   --   --  8.8  RBC 4.72  --  4.60  --   --   --  4.50  HGB 13.6   < > 13.3   < > 13.9 13.6 13.2  HCT 42.2   < > 41.3   < > 41.0 40.0 39.7  MCV 89.4  --  89.8  --   --   --  88.2    MCH 28.8  --  28.9  --   --   --  29.3  MCHC 32.2  --  32.2  --   --   --  33.2  RDW 13.6  --  13.7  --   --   --  13.6  PLT 142*  --  134*  --   --   --  129*   < > = values in this interval not displayed.    BNP Recent Labs  Lab 09/08/19 1800  BNP 264.7*     DDimer No results for input(s): DDIMER in the last 168 hours.   Radiology    CARDIAC CATHETERIZATION  Result Date: 09/10/2019  Mid LM to Dist LM lesion is 70% stenosed.  1st Mrg lesion is 75% stenosed.  2nd Mrg-1 lesion is 90% stenosed.  2nd Mrg-2 lesion is 95% stenosed.  Mid RCA to Dist RCA lesion is 100% stenosed.  Prox RCA to Mid RCA lesion is 90% stenosed.  2nd Diag lesion  is 75% stenosed.  Origin to Prox Graft lesion between 2nd Mrg and RPDA is 99% stenosed.  Prox Graft to Mid Graft lesion between Ramus and 2nd Mrg is 75% stenosed.  A drug-eluting stent was successfully placed using a STENT RESOLUTE ONYX 3.0X18.  Post intervention, there is a 0% residual stenosis.  A drug-eluting stent was successfully placed.  Post intervention, there is a 0% residual stenosis.  Hemodynamic findings consistent with mitral valve regurgitation.  Raymond Christensen is a 79 y.o. male  [id number] LOCATION:  FACILITY: [facility] PHYSICIAN: Raymond Christensen, M.D. 11-10-1940 DATE OF PROCEDURE:  09/09/2019 DATE OF DISCHARGE: CARDIAC CATHETERIZATION / PCI DES SVG Om2-PDA History obtained from chart review.Raymond Christensen is a 79 y.o. male with a history of MI in 1994 c/b cardiac arrest s/p lytics and PTCA, CABG in 2001 (LIMA to LAD, SVG to OM-2, SVG to right PDA, SVG to D-2) with post-op AF, carotid stenosis s/p L CEA and right subclavian stenosis, diet-controlled DM, HTN, obesity, OSA on CPAP, mild cognitive impairment, depression/mood disorder, and hypothyroidism. LV function seems to be preserved based on available records. He is being seen today for the evaluation of 1 week of epigastric discomfort.  His troponins were elevated at 300.  His EKG showed  atrial fibrillation.  2D echo revealed decline in his ejection fraction of 35% with moderate to severe MR.  Based on this was decided to proceed with right and left heart cath to further define his anatomy and physiology with anticipation of potentially needing transesophageal echocardiography to better characterize his mitral valve. PROCEDURE DESCRIPTION: The patient was brought to the second floor [facility] cath lab in the postabsorptive state. He was premedicated with IV Versed and fentanyl. His right groinwas prepped and shaved in usual sterile fashion. Xylocaine 1% was used for local anesthesia. A 5 French sheath was inserted into the right common femoral  artery using standard Seldinger technique.  A 7 French sheath was inserted into the right common femoral vein.  5 French right left Judkins diagnostic catheters along with a a 5 French pigtail catheter were used for selective coronary angiography, selective vein graft and IMA graft angiography, obtaining left heart pressures and supravalvular aortography.  Isovue dye was used for the entirety of the case.  Retrograde aorta, ventricular and pullback pressures were recorded.  A 7 [place] balloontipped demolition Swan-Ganz catheter was advanced through the right heart chambers obtaining sequential pressures and blood samples for the determination of Fick and thermodilution cardiac outputs. The patient received a total of 12,000 as of heparin with an ACT of greater than 300.  He received Plavix 600 mg p.o. along with Pepcid 20 mg IV.  On the pigtail was used for the entirety of the intervention.  Retrograde or pressures monitored during the case. Using a 6 [place] RCB guide catheter along with a 0.14 P[place]ater guidewire and a 2 mm x 12 mm balloon I was able to engage the ostium of the sequential vein graft and passed a wire across the tightest stenosis just beyond the first anastomosis to OM 2.  I predilated with a 2 mm x 12 mm balloon establishing  antegrade flow.  I then placed a 3 mm x 18 mm long Medtronic Onyx resolute drug-eluting stent deployed at 14 atm (3.1 mm) resulting reduction of a 99% stenosis to 0% residual.  Following this I direct stented an area of an intraluminal filling defect that appeared to be calcium with a 2.75 mm x 16 mm long Synergy drug-eluting  stent deployed at 14 atm (2.8 mm) resulting reduction of a 75% mid followed by 99% mid sequential SVG to OM 2 and PDA with excellent flow.  The patient tolerated procedure well.  The guidewire and catheter were removed and the sheaths were secured in place.   Mr. Exley has an occluded RCA with an occluded second diagonal branch vein graft at the aorta and high-grade disease in a sequential vein graft supplying the second obtuse marginal branch and PDA.  His filling pressures are fairly low.  I placed 2 sequential drug-eluting stents with excellent result.  It is unclear whether his MR is structural or functional but I suspect he may require a transesophageal echo to further evaluate.  The sheath will be removed once the ACT falls below 170 and pressure held.  He will be gently hydrated.  He will need to be treated with an oral anticoagulant for his A. fib as well as aspirin and Plavix for 30 days after which the aspirin can be discontinued.  He will need at least 12 months of Plavix and long-term oral anticoagulation. Raymond Christensen. MD, [facility] 09/09/2019 11:41 AM   DG Foot 2 Views Left  Result Date: 09/10/2019 CLINICAL DATA:  Pain and redness first digit EXAM: LEFT FOOT - 2 VIEW COMPARISON:  None. FINDINGS: Frontal and lateral views were obtained. No fracture or dislocation. Joint spaces appear unremarkable. No erosive change or bony destruction. No radiopaque foreign body. There is a small inferior calcaneal spur. There are multiple foci of arterial vascular calcification. No evidence soft tissue air. IMPRESSION: Multiple foci of arterial vascular calcification. No fracture or  dislocation. No bony destruction or erosion. No soft tissue air. Inferior calcaneal spur present. Electronically Signed   By: Lowella Grip III M.D.   On: 09/10/2019 10:21   ECHOCARDIOGRAM COMPLETE  Result Date: 09/08/2019    ECHOCARDIOGRAM REPORT   Patient Name:   Raymond Christensen Date of Exam: 09/08/2019 Medical Rec #:  [id number]      Height:       68.0 in Accession #:    [id number]     Weight:       238.5 lb Date of Birth:  Sep 05, 1940      BSA:          2.203 m Patient Age:    18 years       BP:           100/85 mmHg Patient Gender: M              HR:           97 bpm. Exam Location:  Inpatient Procedure: 2D Echo and Intracardiac Opacification Agent Indications:    NSTEMI I21.4  History:        Patient has no prior history of Echocardiogram examinations.                 CAD; Risk Factors:Dyslipidemia and Diabetes.  Sonographer:    Raymond Christensen RDCS (AE) Referring Phys: [id number] Raymond Christensen IMPRESSIONS  1. Poor quality study despite contrast. EF 35-40% with severe HK of the posterior wall. Moderate to severe MR related with eccentric jet that is poorly quantitated. Would strongly consider TEE.  2. Left ventricular ejection fraction, by estimation, is 35 to 40%. The left ventricle has moderately decreased function. The left ventricle demonstrates regional wall motion abnormalities (see scoring diagram/findings for description). Left ventricular  diastolic function could not be evaluated.  3. Right ventricular systolic function is normal.  The right ventricular size is normal. There is normal pulmonary artery systolic pressure. The estimated right ventricular systolic pressure is 67.6 mmHg.  4. Left atrial size was moderately dilated.  5. Right atrial size was mildly dilated.  6. There is moderate to severe MR that is poorly quantitated, posteriorly directed, and eccentric. Suspect this is related to restricted movement of the PMVL is systole due to posterior wall hypokinesis (IIIB pathology). There were  poor acoustic windows  for TTE as well. Would consider a TEE for better evaluation of the pathology if clinically indicated. The mitral valve is abnormal. Moderate to severe mitral valve regurgitation. No evidence of mitral stenosis.  7. The aortic valve is tricuspid. Aortic valve regurgitation is not visualized. Mild aortic valve sclerosis is present, with no evidence of aortic valve stenosis.  8. The inferior vena cava is normal in size with greater than 50% respiratory variability, suggesting right atrial pressure of 3 mmHg. FINDINGS  Left Ventricle: Left ventricular ejection fraction, by estimation, is 35 to 40%. The left ventricle has moderately decreased function. The left ventricle demonstrates regional wall motion abnormalities. Definity contrast agent was given IV to delineate the left ventricular endocardial borders. The left ventricular internal cavity size was normal in size. There is no left ventricular hypertrophy. Abnormal (paradoxical) septal motion consistent with post-operative status. Left ventricular diastolic function could not be evaluated due to atrial fibrillation. Left ventricular diastolic function could not be evaluated.  LV Wall Scoring: The posterior wall is hypokinetic. Right Ventricle: The right ventricular size is normal. No increase in right ventricular wall thickness. Right ventricular systolic function is normal. There is normal pulmonary artery systolic pressure. The tricuspid regurgitant velocity is 2.40 m/s, and  with an assumed right atrial pressure of 3 mmHg, the estimated right ventricular systolic pressure is 19.5 mmHg. Left Atrium: Left atrial size was moderately dilated. Right Atrium: Right atrial size was mildly dilated. Pericardium: Trivial pericardial effusion is present. Mitral Valve: There is moderate to severe MR that is poorly quantitated, posteriorly directed, and eccentric. Suspect this is related to restricted movement of the PMVL is systole due to posterior wall  hypokinesis (IIIB pathology). There were poor acoustic windows for TTE as well. Would consider a TEE for better evaluation of the pathology if clinically indicated. The mitral valve is abnormal. Moderate to severe mitral valve regurgitation. No evidence of mitral valve stenosis. Tricuspid Valve: The tricuspid valve is grossly normal. Tricuspid valve regurgitation is mild . No evidence of tricuspid stenosis. Aortic Valve: The aortic valve is tricuspid. Aortic valve regurgitation is not visualized. Mild aortic valve sclerosis is present, with no evidence of aortic valve stenosis. Pulmonic Valve: The pulmonic valve was grossly normal. Pulmonic valve regurgitation is trivial. No evidence of pulmonic stenosis. Aorta: The aortic root and ascending aorta are structurally normal, with no evidence of dilitation. Venous: The inferior vena cava is normal in size with greater than 50% respiratory variability, suggesting right atrial pressure of 3 mmHg. IAS/Shunts: The atrial septum is grossly normal.  LEFT VENTRICLE PLAX 2D LVIDd:         5.08 cm LVIDs:         3.36 cm LV PW:         0.88 cm LV IVS:        1.18 cm LVOT diam:     2.30 cm LV SV:         47 LV SV Index:   21 LVOT Area:     4.15 cm  RIGHT VENTRICLE RV S prime:     8.27 cm/s TAPSE (M-mode): 1.6 cm LEFT ATRIUM              Index       RIGHT ATRIUM           Index LA diam:        5.00 cm  2.27 cm/m  RA Area:     23.50 cm LA Vol (A2C):   96.9 ml  43.99 ml/m RA Volume:   63.30 ml  28.74 ml/m LA Vol (A4C):   130.0 ml 59.02 ml/m LA Biplane Vol: 116.0 ml 52.66 ml/m  AORTIC VALVE LVOT Vmax:   61.30 cm/s LVOT Vmean:  38.567 cm/s LVOT VTI:    0.112 m  AORTA Ao Root diam: 3.30 cm MR Peak grad: 97.8 mmHg   TRICUSPID VALVE MR Mean grad: 68.5 mmHg   TR Peak grad:   23.0 mmHg MR Vmax:      494.50 cm/s TR Vmax:        240.00 cm/s MR Vmean:     398.0 cm/s                           SHUNTS                           Systemic VTI:  0.11 m                           Systemic Diam:  2.30 cm Raymond Chiquito MD Electronically signed by Raymond Chiquito MD Signature Date/Time: 09/08/2019/3:34:47 PM    Final     Cardiac Studies   ECHOCARDIOGRAPHY 09/08/2019: IMPRESSIONS    1. Poor quality study despite contrast. EF 35-40% with severe HK of the  posterior wall. Moderate to severe MR related with eccentric jet that is  poorly quantitated. Would strongly consider TEE.  2. Left ventricular ejection fraction, by estimation, is 35 to 40%. The  left ventricle has moderately decreased function. The left ventricle  demonstrates regional wall motion abnormalities (see scoring  diagram/findings for description). Left ventricular  diastolic function could not be evaluated.  3. Right ventricular systolic function is normal. The right ventricular  size is normal. There is normal pulmonary artery systolic pressure. The  estimated right ventricular systolic pressure is 16.1 mmHg.  4. Left atrial size was moderately dilated.  5. Right atrial size was mildly dilated.  6. There is moderate to severe MR that is poorly quantitated, posteriorly  directed, and eccentric. Suspect this is related to restricted movement of  the PMVL is systole due to posterior wall hypokinesis (IIIB pathology).  There were poor acoustic windows  for TTE as well. Would consider a TEE for better evaluation of the  pathology if clinically indicated. The mitral valve is abnormal. Moderate  to severe mitral valve regurgitation. No evidence of mitral stenosis.  7. The aortic valve is tricuspid. Aortic valve regurgitation is not  visualized. Mild aortic valve sclerosis is present, with no evidence of  aortic valve stenosis.  8. The inferior vena cava is normal in size with greater than 50%  respiratory variability, suggesting right atrial pressure of 3 mmHg.   Cath PCI 09/09/2019: Diagnostic Dominance: Right  Intervention       Patient Profile     79 y.o. male with a history of MI in 1994 c/b  cardiac arrest s/p  lytics and PTCA, CABG in 2001 (LIMA to LAD, SVG to OM-2, SVG to right PDA, SVG to D-2) with post-op AF, carotid stenosis s/p L CEA and right subclavian stenosis, diet-controlled DM, HTN, obesity, OSA on CPAP, mild cognitive impairment, depression/mood disorder, and hypothyroidism. Admitted with new onset afib, elevated troponin I and new reduced EF, 35-40% with moderate to severe MR. Cath 09/09/2019 led to Stent SVG to OM/PDA with resolution of angina.  Assessment & Plan    1. Progressive angina pectoris in the setting of prior coronary artery bypass grafting: Resolved with PCI of saphenous vein graft to the obtuse marginal/PDA. Aggressive risk factor modification shall be continued. Aspirin and Plavix for 1 month then drop aspirin.  2. Atrial fibrillation, new onset, uncertain duration: Symptomatic related to atrial fib. Initial thought was to anticoagulate for 3 to 4 weeks then do cardioversion. After noting extremely high heart rates with physical activity, we will plan to do TEE guided electrical cardioversion on Monday morning. He should be n.p.o. after midnight. Apixaban has been started 5 mg twice daily. Increase intensity of carvedilol for better rate control. 3. New left ventricular systolic dysfunction: Titrate carvedilol as tolerated, Aldactone 12.5 mg daily, Entresto 24/26 mg daily. Consider SGL T2 if tolerates current therapy without difficulty. There is a suspicion that LV dysfunction may be in part related to A. fib with poor rate control. 4. Presumed Gout left great toe: Continue colchicine BID, and closely monitor progress over weekend.  He will need to be made n.p.o. after midnight on Sunday with orders for TEE cardioversion to be done on Monday a.m.      For questions or updates, please contact [place] Please consult [email or web address] for contact info under        Signed, Raymond Grooms, MD  09/10/2019, 12:42 PM

## 2019-09-10 NOTE — Telephone Encounter (Signed)
Patient's Wife Mrs Colavito called to let us know that 1 she wasn't aware of the att for Tues that Shirlean Mylar just scheduled. She also states [place] is in the hospital and appt was canceled. She states she would like for Robin to all her back after Lunch to get update on [place]. Her all back number is  [phone]. Please Call after lunch.

## 2019-09-10 NOTE — Progress Notes (Signed)
[facility] PROGRESS NOTE    Progress Note  Raymond Christensen  QMV:[id number] DOB: 1940/07/18 DOA: 09/07/2019 PCP: [name], DO     Brief Narrative:   Raymond Christensen is an 79 y.o. male past medical history of essential hypertension, hyperlipidemia diet-controlled diabetes mellitus type 2 CAD and peripheral vascular disease status post left carotid endarterectomy morbid obesity obstructive sleep apnea comes in with epigastric abdominal pain that started 1 week prior to admission.  He tachycardic and tachypneic with a low potassium troponin I 300 and EKG A. fib was started on IV heparin  Assessment/Plan:   NSTEMI (non-ST elevated myocardial infarction) ([place]); 2D echo showed an EF of 35% with severe hypokinesia of the posterior wall. Admitted to telemetry, on IV heparin, aspirin, statins and nitroglycerin. Cardiology was consulted recommended diagnostic cardiac cath on 09/09/2019 with 2 drug-eluting stents placed, he will continue aspirin and Plavix for 30 days and then drop the aspirin and continue Plavix and Eliquis. Continue statins.  Paroxysmal atrial fibrillation: Patient has a prior history of postoperative atrial fibrillation following CABG with no recurrent episodes since then.   Now with new onset atrial fibrillation with a chads vas score of at least 5, he is on IV heparin, will need oral anticoagulation as an outpatient. Patient is currently rate controlled, but had 1 episodes of tachycardia overnight.  He will need a TEE for further evaluation as A. fib may be contributing to his decreased EF. Currently on IV heparin will need oral anticoagulant as an outpatient.  Acute combined systolic and diastolic heart failure: TEE will be needed for further evaluation of new finding of potential mitral valve dysfunction.  There is a concern for new MR leading to systolic dysfunction.  CAD: Continue statins.  Essential hypertension: Holding antihypertensive medication,  blood pressure seems to be stable.  Diabetes mellitus type 2: With an A1c of 6.6 continue carb modified diet after cardiac cath.  Hypothyroidism: Continue Synthroid.  Chronic thrombocytopenia: No signs of bleeding. See to chroni compare to 2019.  Upper abdominal discomfort: Abdominal pain seems to be resolved.  Obstructive sleep apnea: Continue CPAP.  Right great toe pain: He does not have a history of gout, is erythematous but not exquisitely tender to palpation.  We will get a foot x-ray continue colchicine.    DVT prophylaxis: lovenox Family Communication:none Status is: Inpatient  Remains inpatient appropriate because:Hemodynamically unstable   Dispo: The patient is from: Home              Anticipated d/c is to: Home              Anticipated d/c date is: 2 days              Patient currently is not medically stable to d/c. Code Status:     Code Status Orders  (From admission, onward)         Start     Ordered   09/08/19 1227  Full code  Continuous        09/08/19 1229        Code Status History    This patient has a current code status but no historical code status.   Advance Care Planning Activity       IV Access:    Peripheral IV   Procedures and diagnostic studies:   CARDIAC CATHETERIZATION  Result Date: 09/10/2019  Mid LM to Dist LM lesion is 70% stenosed.  1st Mrg lesion is 75% stenosed.  2nd Mrg-1 lesion is  90% stenosed.  2nd Mrg-2 lesion is 95% stenosed.  Mid RCA to Dist RCA lesion is 100% stenosed.  Prox RCA to Mid RCA lesion is 90% stenosed.  2nd Diag lesion is 75% stenosed.  Origin to Prox Graft lesion between 2nd Mrg and RPDA is 99% stenosed.  Prox Graft to Mid Graft lesion between Ramus and 2nd Mrg is 75% stenosed.  A drug-eluting stent was successfully placed using a STENT RESOLUTE ONYX 3.0X18.  Post intervention, there is a 0% residual stenosis.  A drug-eluting stent was successfully placed.  Post intervention, there is a  0% residual stenosis.  Hemodynamic findings consistent with mitral valve regurgitation.  Raymond Christensen is a 79 y.o. male  [id number] LOCATION:  FACILITY: [facility] PHYSICIAN: Quay Burow, M.D. [birth year] DATE OF PROCEDURE:  09/09/2019 DATE OF DISCHARGE: CARDIAC CATHETERIZATION / PCI DES SVG Om2-PDA History obtained from chart review.Raymond Christensen is a 79 y.o. male with a history of MI in 1994 c/b cardiac arrest s/p lytics and PTCA, CABG in 2001 (LIMA to LAD, SVG to OM-2, SVG to right PDA, SVG to D-2) with post-op AF, carotid stenosis s/p L CEA and right subclavian stenosis, diet-controlled DM, HTN, obesity, OSA on CPAP, mild cognitive impairment, depression/mood disorder, and hypothyroidism. LV function seems to be preserved based on available records. He is being seen today for the evaluation of 1 week of epigastric discomfort.  His troponins were elevated at 300.  His EKG showed atrial fibrillation.  2D echo revealed decline in his ejection fraction of 35% with moderate to severe MR.  Based on this was decided to proceed with right and left heart cath to further define his anatomy and physiology with anticipation of potentially needing transesophageal echocardiography to better characterize his mitral valve. PROCEDURE DESCRIPTION: The patient was brought to the second floor [facility] cath lab in the postabsorptive state. He was premedicated with IV Versed and fentanyl. His right groinwas prepped and shaved in usual sterile fashion. Xylocaine 1% was used for local anesthesia. A 5 French sheath was inserted into the right common femoral  artery using standard Seldinger technique.  A 7 French sheath was inserted into the right common femoral vein.  5 French right left Judkins diagnostic catheters along with a a 5 French pigtail catheter were used for selective coronary angiography, selective vein graft and IMA graft angiography, obtaining left heart pressures and supravalvular aortography.  Isovue dye was used  for the entirety of the case.  Retrograde aorta, ventricular and pullback pressures were recorded.  A 7 [place] balloontipped demolition Swan-Ganz catheter was advanced through the right heart chambers obtaining sequential pressures and blood samples for the determination of Fick and thermodilution cardiac outputs. The patient received a total of 12,000 as of heparin with an ACT of greater than 300.  He received Plavix 600 mg p.o. along with Pepcid 20 mg IV.  On the pigtail was used for the entirety of the intervention.  Retrograde or pressures monitored during the case. Using a 6 [place] RCB guide catheter along with a 0.14 [place] guidewire and a 2 mm x 12 mm balloon I was able to engage the ostium of the sequential vein graft and passed a wire across the tightest stenosis just beyond the first anastomosis to OM 2.  I predilated with a 2 mm x 12 mm balloon establishing antegrade flow.  I then placed a 3 mm x 18 mm long Medtronic Onyx resolute drug-eluting stent deployed at 14 atm (3.1 mm) resulting reduction of a  99% stenosis to 0% residual.  Following this I direct stented an area of an intraluminal filling defect that appeared to be calcium with a 2.75 mm x 16 mm long Synergy drug-eluting stent deployed at 14 atm (2.8 mm) resulting reduction of a 75% mid followed by 99% mid sequential SVG to OM 2 and PDA with excellent flow.  The patient tolerated procedure well.  The guidewire and catheter were removed and the sheaths were secured in place.   Mr. Correia has an occluded RCA with an occluded second diagonal branch vein graft at the aorta and high-grade disease in a sequential vein graft supplying the second obtuse marginal branch and PDA.  His filling pressures are fairly low.  I placed 2 sequential drug-eluting stents with excellent result.  It is unclear whether his MR is structural or functional but I suspect he may require a transesophageal echo to further evaluate.  The sheath will be removed once the ACT  falls below 170 and pressure held.  He will be gently hydrated.  He will need to be treated with an oral anticoagulant for his A. fib as well as aspirin and Plavix for 30 days after which the aspirin can be discontinued.  He will need at least 12 months of Plavix and long-term oral anticoagulation. Quay Burow. MD, [facility] 09/09/2019 11:41 AM   ECHOCARDIOGRAM COMPLETE  Result Date: 09/08/2019    ECHOCARDIOGRAM REPORT   Patient Name:   Raymond Christensen Date of Exam: 09/08/2019 Medical Rec #:  [id number]      Height:       68.0 in Accession #:    [id number]     Weight:       238.5 lb Date of Birth:  May 25, 1940      BSA:          2.203 m Patient Age:    73 years       BP:           100/85 mmHg Patient Gender: M              HR:           97 bpm. Exam Location:  Inpatient Procedure: 2D Echo and Intracardiac Opacification Agent Indications:    NSTEMI I21.4  History:        Patient has no prior history of Echocardiogram examinations.                 CAD; Risk Factors:Dyslipidemia and Diabetes.  Sonographer:    Mikki Santee RDCS (AE) Referring Phys: [id number] RONDELL A SMITH IMPRESSIONS  1. Poor quality study despite contrast. EF 35-40% with severe HK of the posterior wall. Moderate to severe MR related with eccentric jet that is poorly quantitated. Would strongly consider TEE.  2. Left ventricular ejection fraction, by estimation, is 35 to 40%. The left ventricle has moderately decreased function. The left ventricle demonstrates regional wall motion abnormalities (see scoring diagram/findings for description). Left ventricular  diastolic function could not be evaluated.  3. Right ventricular systolic function is normal. The right ventricular size is normal. There is normal pulmonary artery systolic pressure. The estimated right ventricular systolic pressure is 48.1 mmHg.  4. Left atrial size was moderately dilated.  5. Right atrial size was mildly dilated.  6. There is moderate to severe MR that is poorly quantitated,  posteriorly directed, and eccentric. Suspect this is related to restricted movement of the PMVL is systole due to posterior wall hypokinesis (IIIB pathology). There were poor acoustic windows  for TTE as well. Would consider a TEE for better evaluation of the pathology if clinically indicated. The mitral valve is abnormal. Moderate to severe mitral valve regurgitation. No evidence of mitral stenosis.  7. The aortic valve is tricuspid. Aortic valve regurgitation is not visualized. Mild aortic valve sclerosis is present, with no evidence of aortic valve stenosis.  8. The inferior vena cava is normal in size with greater than 50% respiratory variability, suggesting right atrial pressure of 3 mmHg. FINDINGS  Left Ventricle: Left ventricular ejection fraction, by estimation, is 35 to 40%. The left ventricle has moderately decreased function. The left ventricle demonstrates regional wall motion abnormalities. Definity contrast agent was given IV to delineate the left ventricular endocardial borders. The left ventricular internal cavity size was normal in size. There is no left ventricular hypertrophy. Abnormal (paradoxical) septal motion consistent with post-operative status. Left ventricular diastolic function could not be evaluated due to atrial fibrillation. Left ventricular diastolic function could not be evaluated.  LV Wall Scoring: The posterior wall is hypokinetic. Right Ventricle: The right ventricular size is normal. No increase in right ventricular wall thickness. Right ventricular systolic function is normal. There is normal pulmonary artery systolic pressure. The tricuspid regurgitant velocity is 2.40 m/s, and  with an assumed right atrial pressure of 3 mmHg, the estimated right ventricular systolic pressure is 09.3 mmHg. Left Atrium: Left atrial size was moderately dilated. Right Atrium: Right atrial size was mildly dilated. Pericardium: Trivial pericardial effusion is present. Mitral Valve: There is moderate  to severe MR that is poorly quantitated, posteriorly directed, and eccentric. Suspect this is related to restricted movement of the PMVL is systole due to posterior wall hypokinesis (IIIB pathology). There were poor acoustic windows for TTE as well. Would consider a TEE for better evaluation of the pathology if clinically indicated. The mitral valve is abnormal. Moderate to severe mitral valve regurgitation. No evidence of mitral valve stenosis. Tricuspid Valve: The tricuspid valve is grossly normal. Tricuspid valve regurgitation is mild . No evidence of tricuspid stenosis. Aortic Valve: The aortic valve is tricuspid. Aortic valve regurgitation is not visualized. Mild aortic valve sclerosis is present, with no evidence of aortic valve stenosis. Pulmonic Valve: The pulmonic valve was grossly normal. Pulmonic valve regurgitation is trivial. No evidence of pulmonic stenosis. Aorta: The aortic root and ascending aorta are structurally normal, with no evidence of dilitation. Venous: The inferior vena cava is normal in size with greater than 50% respiratory variability, suggesting right atrial pressure of 3 mmHg. IAS/Shunts: The atrial septum is grossly normal.  LEFT VENTRICLE PLAX 2D LVIDd:         5.08 cm LVIDs:         3.36 cm LV PW:         0.88 cm LV IVS:        1.18 cm LVOT diam:     2.30 cm LV SV:         47 LV SV Index:   21 LVOT Area:     4.15 cm  RIGHT VENTRICLE RV S prime:     8.27 cm/s TAPSE (M-mode): 1.6 cm LEFT ATRIUM              Index       RIGHT ATRIUM           Index LA diam:        5.00 cm  2.27 cm/m  RA Area:     23.50 cm LA Vol (A2C):   96.9 ml  43.99 ml/m RA Volume:   63.30 ml  28.74 ml/m LA Vol (A4C):   130.0 ml 59.02 ml/m LA Biplane Vol: 116.0 ml 52.66 ml/m  AORTIC VALVE LVOT Vmax:   61.30 cm/s LVOT Vmean:  38.567 cm/s LVOT VTI:    0.112 m  AORTA Ao Root diam: 3.30 cm MR Peak grad: 97.8 mmHg   TRICUSPID VALVE MR Mean grad: 68.5 mmHg   TR Peak grad:   23.0 mmHg MR Vmax:      494.50 cm/s TR  Vmax:        240.00 cm/s MR Vmean:     398.0 cm/s                           SHUNTS                           Systemic VTI:  0.11 m                           Systemic Diam: 2.30 cm Eleonore Chiquito MD Electronically signed by Eleonore Chiquito MD Signature Date/Time: 09/08/2019/3:34:47 PM    Final      Medical Consultants:    None.  Anti-Infectives:   none  Subjective:    Raymond Christensen he denies any chest pain or shortness of breath he does relate that his right big toe is painful  Objective:    Vitals:   09/09/19 2040 09/10/19 0504 09/10/19 0818 09/10/19 0839  BP: 132/69 130/77 130/74 (!) 143/75  Pulse: 92 78 96 (!) 114  Resp: _0 Temp: 98.2 F (36.8 C) 97.6 F (36.4 C) 97.9 F (36.6 C)   TempSrc: Oral Oral Oral   SpO2: 97% 98% 95% 94%  Weight:  106.1 kg    Height:       SpO2: 94 % O2 Flow Rate (L/min): 2 L/min   Intake/Output Summary (Last 24 hours) at 09/10/2019 0924 Last data filed at 09/10/2019 0843 Gross per 24 hour  Intake 588 ml  Output 1325 ml  Net -737 ml   Filed Weights   09/08/19 1056 09/09/19 0152 09/10/19 0504  Weight: 108.2 kg 107.5 kg 106.1 kg    Exam: General exam: In no acute distress. Respiratory system: Good air movement and clear to auscultation. Cardiovascular system: S1 & S2 heard, RRR. No JVD. Gastrointestinal system: Abdomen is nondistended, soft and nontender.  Extremities: No pedal edema. Skin: The big toe on the lateral side of the ear is erythematous but not warm not exquisitely tender to touch it is tender to palpation and is able to remove the great.   Data Reviewed:    Labs: Basic Metabolic Panel: Recent Labs  Lab 09/07/19 2239 09/07/19 2239 09/08/19 1254 09/08/19 1254 09/09/19 0707 09/09/19 0707 09/09/19 0959 09/09/19 0959 09/09/19 1001 09/09/19 1001 09/09/19 1002 09/10/19 0418  NA 138   < > 140   < > 139  --  140  --  141  --  140 137  K 3.2*   < > 3.8   < > 3.6   < > 3.8   < > 3.6   < > 3.6 3.6  CL 102   --  103  --  101  --   --   --   --   --   --  104  CO2 27  --  27  --  27  --   --   --   --   --   --  25  GLUCOSE 148*  --  133*  --  139*  --   --   --   --   --   --  125*  BUN 24*  --  16  --  14  --   --   --   --   --   --  11  CREATININE 0.92  --  0.92  --  1.01  --   --   --   --   --   --  0.92  CALCIUM 8.9  --  9.3  --  9.1  --   --   --   --   --   --  8.9  MG  --   --   --   --  1.8  --   --   --   --   --   --   --    < > = values in this interval not displayed.   GFR Estimated Creatinine Clearance: 79.4 mL/min (by C-G formula based on SCr of 0.92 mg/dL). Liver Function Tests: Recent Labs  Lab 09/07/19 2239  AST 31  ALT 34  ALKPHOS 54  BILITOT <0.1*  PROT 6.9  ALBUMIN 3.8   Recent Labs  Lab 09/07/19 1621 09/07/19 2239  LIPASE 12 26   No results for input(s): AMMONIA in the last 168 hours. Coagulation profile No results for input(s): INR, PROTIME in the last 168 hours. COVID-19 Labs  No results for input(s): DDIMER, FERRITIN, LDH, CRP in the last 72 hours.  Lab Results  Component Value Date   [place] NEGATIVE 09/08/2019    CBC: Recent Labs  Lab 09/07/19 1621 09/07/19 1621 09/07/19 2239 09/07/19 2239 09/08/19 1254 09/08/19 1254 09/09/19 0707 09/09/19 0959 09/09/19 1001 09/09/19 1002 09/10/19 0418  WBC 9.0  --  9.0  --  9.4  --  10.5  --   --   --  8.8  NEUTROABS 5,715  --  5.5  --   --   --   --   --   --   --   --   HGB 13.5   < > 13.5   < > 13.6   < > 13.3 13.6 13.9 13.6 13.2  HCT 40.6   < > 41.2   < > 42.2   < > 41.3 40.0 41.0 40.0 39.7  MCV 87.9  --  89.2  --  89.4  --  89.8  --   --   --  88.2  PLT 150  --  143*  --  142*  --  134*  --   --   --  129*   < > = values in this interval not displayed.   Cardiac Enzymes: Recent Labs  Lab 09/07/19 1621  TROPONINI 514*   BNP (last 3 results) No results for input(s): PROBNP in the last 8760 hours. CBG: Recent Labs  Lab 09/08/19 1239 09/09/19 1157  GLUCAP 134* 123*    D-Dimer: No results for input(s): DDIMER in the last 72 hours. Hgb A1c: No results for input(s): HGBA1C in the last 72 hours. Lipid Profile: Recent Labs    09/08/19 1254  CHOL 135  HDL 42  LDLCALC 76  TRIG 85  CHOLHDL 3.2   Thyroid function studies: No results for input(s): TSH, T4TOTAL, T3FREE, THYROIDAB in the last 72 hours.  Invalid input(s): FREET3 Anemia work up: Recent Labs    09/07/19 1621  VITAMINB12 1,659*  IRON 51   Sepsis Labs: Recent Labs  Lab 09/07/19 2239 09/08/19 1254 09/09/19 0707 09/10/19 0418  WBC 9.0 9.4 10.5 8.8   Microbiology Recent Results (from the past 240 hour(s))  SARS Coronavirus 2 by RT PCR (hospital order, performed in [facility] hospital lab) Nasopharyngeal Nasopharyngeal Swab     Status: None   Collection Time: 09/08/19  3:20 AM   Specimen: Nasopharyngeal Swab  Result Value Ref Range Status   SARS Coronavirus 2 NEGATIVE NEGATIVE Final    Comment: (NOTE) SARS-CoV-2 target nucleic acids are NOT DETECTED.  The SARS-CoV-2 RNA is generally detectable in upper and lower respiratory specimens during the acute phase of infection. The lowest concentration of SARS-CoV-2 viral copies this assay can detect is 250 copies / mL. A negative result does not preclude SARS-CoV-2 infection and should not be used as the sole basis for treatment or other patient management decisions.  A negative result may occur with improper specimen collection / handling, submission of specimen other than nasopharyngeal swab, presence of viral mutation(s) within the areas targeted by this assay, and inadequate number of viral copies (<250 copies / mL). A negative result must be combined with clinical observations, patient history, and epidemiological information.  Fact Sheet for Patients:   StrictlyIdeas.no  Fact Sheet for Healthcare Providers: BankingDealers.co.za  This test is not yet approved or  cleared by  the [place] FDA and has been authorized for detection and/or diagnosis of SARS-CoV-2 by FDA under an Emergency Use Authorization (EUA).  This EUA will remain in effect (meaning this test can be used) for the duration of the COVID-19 declaration under Section 564(b)(1) of the Act, 21 U.S.C. section 360bbb-3(b)(1), unless the authorization is terminated or revoked sooner.  Performed at [facility], [address] [place], [place] [address]      Medications:   . apixaban  5 mg Oral BID  . aspirin EC  81 mg Oral QHS  . carvedilol  3.125 mg Oral BID WC  . clopidogrel  75 mg Oral Q breakfast  . ezetimibe  10 mg Oral Daily  . finasteride  5 mg Oral Daily  . levothyroxine  75 mcg Oral QAC breakfast  . liothyronine  10 mcg Oral Daily  . loratadine  10 mg Oral Daily  . potassium chloride  40 mEq Oral Once  . sacubitril-valsartan  1 tablet Oral BID  . simvastatin  40 mg Oral QHS  . sodium chloride flush  3 mL Intravenous Q12H  . spironolactone  12.5 mg Oral Daily  . tamsulosin  0.4 mg Oral QHS   Continuous Infusions: . sodium chloride        LOS: 2 days   Charlynne Cousins  [facility]  09/10/2019, 9:24 AM

## 2019-09-10 NOTE — Progress Notes (Signed)
CARDIAC REHAB PHASE I   PRE:  Rate/Rhythm: 108-120 afib    BP: sitting 143/75    SaO2: 97 RA  MODE:  Ambulation: 90 ft   POST:  Rate/Rhythm: 146 afib    BP: sitting 154/99, retake 144/82     SaO2: 95 RA  Pt c/o gout pain left toe. Able to walk with slight imbalance but HR up quickly with standing and walking. SOB after sitting. Began discussing education. He really prefers to educate himself so left materials for him to read over weekend ([employer] book, HF book, MI book, low sodium diets). Did reinforce importance of Plavix. He will be interested in [place] and will refer to [place]. Will discuss exercise guidelines later after gout resolves.   [place], ACSM 09/10/2019 9:28 AM

## 2019-09-10 NOTE — Telephone Encounter (Signed)
Called left message to call back 

## 2019-09-10 NOTE — Progress Notes (Signed)
Patient ambulated 250 ft down the hallway. Tolerated well, no complaint of c/p or sob. Reported pain coming from his left toe which he states impede his walking. RN suggest using heat/cold packs on toe, pt agrees.

## 2019-09-10 NOTE — Progress Notes (Signed)
TEE/Cardioversion scheduled Monday @ 9:30am. Orders done held.

## 2019-09-10 NOTE — Discharge Instructions (Signed)

## 2019-09-11 DIAGNOSIS — Z955 Presence of coronary angioplasty implant and graft: Secondary | ICD-10-CM

## 2019-09-11 LAB — URIC ACID: Uric Acid, Serum: 6.3 mg/dL (ref 3.7–8.6)

## 2019-09-11 LAB — MAGNESIUM: Magnesium: 1.8 mg/dL (ref 1.7–2.4)

## 2019-09-11 MED ORDER — TRAMADOL HCL 50 MG PO TABS: 50.0000 mg | ORAL_TABLET | Freq: Four times a day (QID) | ORAL | Status: DC | PRN

## 2019-09-11 MED ORDER — COLCHICINE 0.3 MG HALF TABLET
0.3000 mg | ORAL_TABLET | Freq: Every day | ORAL | Status: DC
  Administered 2019-09-11 – 2019-09-14 (×4): 0.3 mg via ORAL
  Filled 2019-09-11 (×5): qty 1

## 2019-09-11 NOTE — Progress Notes (Signed)
CARDIAC REHAB PHASE I   PRE:  Rate/Rhythm: 90 afib  BP:  Sitting: 110/72        SaO2: 98 RA  MODE:  Ambulation: 940 ft   POST:  Rate/Rhythm: 110 afib  BP:  Sitting: 127/80        SaO2: 97 RA  1115 -   Pt ambulated 940 ft independently and complained of pain in his left big toe, which he states is normal. Otherwise, he tolerated the walk well. Pt returned to recliner with call bell within reach.   Philis Kendall, MS, ACSM CEP 09/11/2019 11:45 AM

## 2019-09-11 NOTE — Progress Notes (Addendum)
Progress Note  Patient Name: Raymond Christensen Date of Encounter: 09/11/2019  Primary Cardiologist: Sinclair Grooms, MD   Subjective   Still having toe pain. Frustrated with difficult phlebotomy stick this morning. We discussed plans for TEE/DCCV on Monday in detail. No complaints of chest pain or SOB today. No LE edema.  Inpatient Medications    Scheduled Meds: . amiodarone  200 mg Oral BID  . apixaban  5 mg Oral BID  . aspirin EC  81 mg Oral QHS  . atorvastatin  80 mg Oral Daily  . carvedilol  6.25 mg Oral BID WC  . clopidogrel  75 mg Oral Q breakfast  . ezetimibe  10 mg Oral Daily  . finasteride  5 mg Oral Daily  . levothyroxine  75 mcg Oral QAC breakfast  . liothyronine  10 mcg Oral Daily  . loratadine  10 mg Oral Daily  . sacubitril-valsartan  1 tablet Oral BID  . sodium chloride flush  3 mL Intravenous Q12H  . spironolactone  12.5 mg Oral Daily  . tamsulosin  0.4 mg Oral QHS   Continuous Infusions: . sodium chloride    . sodium chloride 20 mL/hr at 09/10/19 1130   PRN Meds: sodium chloride, acetaminophen, fluticasone, morphine injection, nitroGLYCERIN, ondansetron (ZOFRAN) IV, sodium chloride flush, traMADol   Vital Signs    Vitals:   09/10/19 2035 09/11/19 0456 09/11/19 0808 09/11/19 1106  BP: 110/85 119/70 96/70 107/63  Pulse: 70 70 (!) 102 80  Resp: 16 19 20 18   Temp: 97.8 F (36.6 C) 97.7 F (36.5 C) 98 F (36.7 C) 97.8 F (36.6 C)  TempSrc: Oral Oral Oral Oral  SpO2: 98% 97% 96% 97%  Weight:  106.1 kg    Height:        Intake/Output Summary (Last 24 hours) at 09/11/2019 1130 Last data filed at 09/11/2019 0700 Gross per 24 hour  Intake 489.92 ml  Output 1350 ml  Net -860.08 ml   Filed Weights   09/09/19 0152 09/10/19 0504 09/11/19 0456  Weight: 107.5 kg 106.1 kg 106.1 kg    Telemetry    Atrial fibrillation with rates generally well controlled, occasional HR up to 110s - Personally Reviewed  ECG    No new tracings - Personally  Reviewed  Physical Exam   GEN: No acute distress.   Neck: No JVD, no carotid bruits Cardiac: IRIR, +murmur, no rubs or gallops.  Respiratory: Clear to auscultation bilaterally, no wheezes/ rales/ rhonchi GI: NABS, Soft, obese, nontender, non-distended  MS: No edema; No deformity. Neuro:  Nonfocal, moving all extremities spontaneously Psych: Normal affect   Labs    Chemistry Recent Labs  Lab 09/07/19 2239 09/07/19 2239 09/08/19 1254 09/08/19 1254 09/09/19 0707 09/09/19 0959 09/09/19 1001 09/09/19 1002 09/10/19 0418  NA 138   < > 140   < > 139   < > 141 140 137  K 3.2*   < > 3.8   < > 3.6   < > 3.6 3.6 3.6  CL 102   < > 103  --  101  --   --   --  104  CO2 27   < > 27  --  27  --   --   --  25  GLUCOSE 148*   < > 133*  --  139*  --   --   --  125*  BUN 24*   < > 16  --  14  --   --   --  11  CREATININE 0.92   < > 0.92  --  1.01  --   --   --  0.92  CALCIUM 8.9   < > 9.3  --  9.1  --   --   --  8.9  PROT 6.9  --   --   --   --   --   --   --   --   ALBUMIN 3.8  --   --   --   --   --   --   --   --   AST 31  --   --   --   --   --   --   --   --   ALT 34  --   --   --   --   --   --   --   --   ALKPHOS 54  --   --   --   --   --   --   --   --   BILITOT <0.1*  --   --   --   --   --   --   --   --   GFRNONAA >60   < > >60  --  >60  --   --   --  >60  GFRAA >60   < > >60  --  >60  --   --   --  >60  ANIONGAP 9   < > 10  --  11  --   --   --  8   < > = values in this interval not displayed.     Hematology Recent Labs  Lab 09/08/19 1254 09/08/19 1254 09/09/19 0707 09/09/19 0959 09/09/19 1001 09/09/19 1002 09/10/19 0418  WBC 9.4  --  10.5  --   --   --  8.8  RBC 4.72  --  4.60  --   --   --  4.50  HGB 13.6   < > 13.3   < > 13.9 13.6 13.2  HCT 42.2   < > 41.3   < > 41.0 40.0 39.7  MCV 89.4  --  89.8  --   --   --  88.2  MCH 28.8  --  28.9  --   --   --  29.3  MCHC 32.2  --  32.2  --   --   --  33.2  RDW 13.6  --  13.7  --   --   --  13.6  PLT 142*  --  134*   --   --   --  129*   < > = values in this interval not displayed.    Cardiac Enzymes Recent Labs  Lab 09/07/19 1621  TROPONINI 514*   No results for input(s): TROPIPOC in the last 168 hours.   BNP Recent Labs  Lab 09/08/19 1800  BNP 264.7*     DDimer No results for input(s): DDIMER in the last 168 hours.   Radiology    DG Foot 2 Views Left  Result Date: 09/10/2019 CLINICAL DATA:  Pain and redness first digit EXAM: LEFT FOOT - 2 VIEW COMPARISON:  None. FINDINGS: Frontal and lateral views were obtained. No fracture or dislocation. Joint spaces appear unremarkable. No erosive change or bony destruction. No radiopaque foreign body. There is a small inferior calcaneal spur. There are multiple foci of arterial vascular calcification. No evidence soft tissue air. IMPRESSION: Multiple  foci of arterial vascular calcification. No fracture or dislocation. No bony destruction or erosion. No soft tissue air. Inferior calcaneal spur present. Electronically Signed   By: Lowella Grip III M.D.   On: 09/10/2019 10:21    Cardiac Studies   Echocardiogram 09/08/19: 1. Poor quality study despite contrast. EF 35-40% with severe HK of the  posterior wall. Moderate to severe MR related with eccentric jet that is  poorly quantitated. Would strongly consider TEE.  2. Left ventricular ejection fraction, by estimation, is 35 to 40%. The  left ventricle has moderately decreased function. The left ventricle  demonstrates regional wall motion abnormalities (see scoring  diagram/findings for description). Left ventricular  diastolic function could not be evaluated.  3. Right ventricular systolic function is normal. The right ventricular  size is normal. There is normal pulmonary artery systolic pressure. The  estimated right ventricular systolic pressure is 51.8 mmHg.  4. Left atrial size was moderately dilated.  5. Right atrial size was mildly dilated.  6. There is moderate to severe MR that is  poorly quantitated, posteriorly  directed, and eccentric. Suspect this is related to restricted movement of  the PMVL is systole due to posterior wall hypokinesis (IIIB pathology).  There were poor acoustic windows  for TTE as well. Would consider a TEE for better evaluation of the  pathology if clinically indicated. The mitral valve is abnormal. Moderate  to severe mitral valve regurgitation. No evidence of mitral stenosis.  7. The aortic valve is tricuspid. Aortic valve regurgitation is not  visualized. Mild aortic valve sclerosis is present, with no evidence of  aortic valve stenosis.  8. The inferior vena cava is normal in size with greater than 50%  respiratory variability, suggesting right atrial pressure of 3 mmHg.   R/LHC 09/09/19:  Mid LM to Dist LM lesion is 70% stenosed.  1st Mrg lesion is 75% stenosed.  2nd Mrg-1 lesion is 90% stenosed.  2nd Mrg-2 lesion is 95% stenosed.  Mid RCA to Dist RCA lesion is 100% stenosed.  Prox RCA to Mid RCA lesion is 90% stenosed.  2nd Diag lesion is 75% stenosed.  Origin to Prox Graft lesion between 2nd Mrg and RPDA is 99% stenosed.  Prox Graft to Mid Graft lesion between Ramus and 2nd Mrg is 75% stenosed.  A drug-eluting stent was successfully placed using a STENT RESOLUTE ONYX 3.0X18.  Post intervention, there is a 0% residual stenosis.  A drug-eluting stent was successfully placed.  Post intervention, there is a 0% residual stenosis.  Hemodynamic findings consistent with mitral valve regurgitation. IMPRESSION: Mr. Ragsdale has an occluded RCA with an occluded second diagonal branch vein graft at the aorta and high-grade disease in a sequential vein graft supplying the second obtuse marginal branch and PDA.  His filling pressures are fairly low.  I placed 2 sequential drug-eluting stents with excellent result.  It is unclear whether his MR is structural or functional but I suspect he may require a transesophageal echo to further  evaluate.  The sheath will be removed once the ACT falls below 170 and pressure held.  He will be gently hydrated.  He will need to be treated with an oral anticoagulant for his A. fib as well as aspirin and Plavix for 30 days after which the aspirin can be discontinued.  He will need at least 12 months of Plavix and long-term oral anticoagulation. Diagnostic Dominance: Right  Intervention       Patient Profile     79 y.o.  male with a history of MI in 1994 c/b cardiac arrest s/p lytics and PTCA, CABG in 2001 (LIMA to LAD, SVG to OM-2, SVG to right PDA, SVG to D-2) with post-op AF, carotid stenosis s/p L CEA and right subclavian stenosis, diet-controlled DM, HTN, obesity, OSA on CPAP, mild cognitive impairment, depression/mood disorder, and hypothyroidism. Admitted with new onset afib, elevated troponin I and new reduced EF, 35-40% with moderate to severe MR. Cath 09/09/2019 led to Stent SVG to OM/PDA with resolution of angina.  Assessment & Plan    1. New onset atrial fibrillation: noted on admission. Unclear duration. He was started on apixaban 5mg  BID for stroke ppx. Initial plan was to anticoagulat x3-4 weeks, then perform outpatient cardioversion is persistent, however in light of reduced EF on echo this admission now planning for TEE/DCCV on Monday 8/30. HR generally stable over the past 24 hours after increasing carvedilol.  - Plan for TEE/DCCV on Monday - orders placed - Continue carvedilol for rate control - Continue apixaban for stroke ppx  2. Unstable angina in patient with CAD s/p CABG: patient had complaints of progressive anginal. He underwent [facility] 09/09/19 which showed severe native multivessel disease with 75% mid and 99% distal SVG to OM/PDA stenosis both managed with separate PCI/DES. He was recommended for aspirin and plavix with plans to stop aspirin after 1 month due to #1. CP has resolved. - Continue aspirin and plavix - plan to stop aspirin 10/10/19 and continue plavix  along with apixaban - Continue statin - Continue carvedilol  3. Acute combined CHF: Echo this admission showed EF 35-40% - felt to be both ischemic in nature, as well as rate related due to new onset atrial fibrillation. He was transitioned from ramipril to entresto and started on spironolactone and carvedilol. Appears euvolemic - Continue carvedilol, entresto, and spironolactone - Could consider addition of SGLT-2 inhibitor if above therapy tolerated - Anticipate repeat echo in 3 weeks to evaluate for improvement in EF.   4. Mitral regurgitation: moderate to severe on echo this admission - Will evaluate further with TEE 09/13/19   5. HTN: BP stable - Managed in the context of #1 and 3  6. HLD: LDL 76 this admission; goal <70. Simvastatin transitioned to atorvastatin 80mg  daily and home zetia continued.  - Continue atorvastatin - Repeat FLP/LFTs in 6-8 weeks - if not at goal, consider referral to lipid clinic for PSK9-inhibitor.  7. DM type 2: A1C 6.6 06/2019 - Could consider addition of SGLT-2 inhibitor if above therapy tolerated  For questions or updates, please contact [place] Please consult [email or web address] for contact info under Cardiology/STEMI.      Signed, Abigail Butts, PA-C  09/11/2019, 11:30 AM   [phone]  I have seen and examined the patient along with Abigail Butts, PA-C.  I have reviewed the chart, notes and new data.  I agree with PA/NP's note.  Key new complaints: no angina or dyspnea Key examination changes: irregular rhythm Key new findings / data: AF rate controlled. L great MTP pain and tenderness sounds highly compatible w podagra.. Only got 2 doses of colchicine w incomplete relief.  PLAN: TEE CV on Monday. This procedure has been fully reviewed with the patient and written informed consent has been obtained. Restart colchicine. Check uric acid.   Sanda Klein, MD, [place] [phone] 09/11/2019, 12:43 PM

## 2019-09-11 NOTE — Progress Notes (Signed)
[facility] PROGRESS NOTE    Progress Note  Katlin Ciszewski  FOY:[id number] DOB: [birth year] DOA: 09/07/2019 PCP: [name], DO     Brief Narrative:   Raymond Christensen is an 79 y.o. male past medical history of essential hypertension, hyperlipidemia diet-controlled diabetes mellitus type 2 CAD and peripheral vascular disease status post left carotid endarterectomy morbid obesity obstructive sleep apnea comes in with epigastric abdominal pain that started 1 week prior to admission.  He tachycardic and tachypneic with a low potassium troponin I 300 and EKG A. fib was started on IV heparin  Assessment/Plan:   NSTEMI (non-ST elevated myocardial infarction) ([place]); 2D echo showed an EF of 35% with severe hypokinesia of the posterior wall. Admitted to telemetry, on IV heparin, aspirin, statins and nitroglycerin. Cardiology was consulted recommended diagnostic cardiac cath on 09/09/2019 with 2 drug-eluting stents  He is currently symptom-free we will continue aspirin and Plavix for 30 days along with Eliquis, drop aspirin in 30 days and then continue Plavix and Eliquis, continue statins.  Paroxysmal atrial fibrillation: Patient has a prior history of postoperative atrial fibrillation following CABG with no recurrent episodes since then.   Chads vas score of at least 5, now on Eliquis. Cardiology recommended a TEE for further evaluation as A. fib may be contributing to his decreased EF.  Acute combined systolic and diastolic heart failure: TEE will be needed for further evaluation of new finding of potential mitral valve dysfunction.  There is a concern for new MR leading to systolic dysfunction.  CAD: Continue statins.  Essential hypertension: Holding antihypertensive medication, blood pressure seems to be stable.  Diabetes mellitus type 2: With an A1c of 6.6 continue carb modified diet after cardiac cath.  Hypothyroidism: Continue Synthroid.  Chronic  thrombocytopenia: No signs of bleeding. See to chroni compare to 2019.  Upper abdominal discomfort: Abdominal pain seems to be resolved.  Obstructive sleep apnea: Continue CPAP.  Right great toe pain: X-ray showed no acute fractures dislocation continue statins, aspirin and Plavix. Avoid NSAIDs start tramadol.  Tylenol not helping.    DVT prophylaxis: lovenox Family Communication:none Status is: Inpatient  Remains inpatient appropriate because:Hemodynamically unstable   Dispo: The patient is from: Home              Anticipated d/c is to: Home              Anticipated d/c date is: >3 days              Patient currently is not medically stable to d/c.  For TEE with cardioversion on 09/13/2019. Code Status:     Code Status Orders  (From admission, onward)         Start     Ordered   09/08/19 1227  Full code  Continuous        09/08/19 1229        Code Status History    This patient has a current code status but no historical code status.   Advance Care Planning Activity       IV Access:    Peripheral IV   Procedures and diagnostic studies:   CARDIAC CATHETERIZATION  Result Date: 09/10/2019  Mid LM to Dist LM lesion is 70% stenosed.  1st Mrg lesion is 75% stenosed.  2nd Mrg-1 lesion is 90% stenosed.  2nd Mrg-2 lesion is 95% stenosed.  Mid RCA to Dist RCA lesion is 100% stenosed.  Prox RCA to Mid RCA lesion is 90% stenosed.  2nd Diag lesion  is 75% stenosed.  Origin to Prox Graft lesion between 2nd Mrg and RPDA is 99% stenosed.  Prox Graft to Mid Graft lesion between Ramus and 2nd Mrg is 75% stenosed.  A drug-eluting stent was successfully placed using a STENT RESOLUTE ONYX 3.0X18.  Post intervention, there is a 0% residual stenosis.  A drug-eluting stent was successfully placed.  Post intervention, there is a 0% residual stenosis.  Hemodynamic findings consistent with mitral valve regurgitation.  Nymir Ringler is a 79 y.o. male  [id number] LOCATION:   FACILITY: [facility] PHYSICIAN: Quay Burow, M.D. December 12, 1940 DATE OF PROCEDURE:  09/09/2019 DATE OF DISCHARGE: CARDIAC CATHETERIZATION / PCI DES SVG Om2-PDA History obtained from chart review.Caeden Foots is a 79 y.o. male with a history of MI in 1994 c/b cardiac arrest s/p lytics and PTCA, CABG in 2001 (LIMA to LAD, SVG to OM-2, SVG to right PDA, SVG to D-2) with post-op AF, carotid stenosis s/p L CEA and right subclavian stenosis, diet-controlled DM, HTN, obesity, OSA on CPAP, mild cognitive impairment, depression/mood disorder, and hypothyroidism. LV function seems to be preserved based on available records. He is being seen today for the evaluation of 1 week of epigastric discomfort.  His troponins were elevated at 300.  His EKG showed atrial fibrillation.  2D echo revealed decline in his ejection fraction of 35% with moderate to severe MR.  Based on this was decided to proceed with right and left heart cath to further define his anatomy and physiology with anticipation of potentially needing transesophageal echocardiography to better characterize his mitral valve. PROCEDURE DESCRIPTION: The patient was brought to the second floor [facility] cath lab in the postabsorptive state. He was premedicated with IV Versed and fentanyl. His right groinwas prepped and shaved in usual sterile fashion. Xylocaine 1% was used for local anesthesia. A 5 French sheath was inserted into the right common femoral  artery using standard Seldinger technique.  A 7 French sheath was inserted into the right common femoral vein.  5 French right left Judkins diagnostic catheters along with a a 5 French pigtail catheter were used for selective coronary angiography, selective vein graft and IMA graft angiography, obtaining left heart pressures and supravalvular aortography.  Isovue dye was used for the entirety of the case.  Retrograde aorta, ventricular and pullback pressures were recorded.  A 7 [place] balloontipped demolition  Swan-Ganz catheter was advanced through the right heart chambers obtaining sequential pressures and blood samples for the determination of Fick and thermodilution cardiac outputs. The patient received a total of 12,000 as of heparin with an ACT of greater than 300.  He received Plavix 600 mg p.o. along with Pepcid 20 mg IV.  On the pigtail was used for the entirety of the intervention.  Retrograde or pressures monitored during the case. Using a 6 [place] RCB guide catheter along with a 0.14 [place] guidewire and a 2 mm x 12 mm balloon I was able to engage the ostium of the sequential vein graft and passed a wire across the tightest stenosis just beyond the first anastomosis to OM 2.  I predilated with a 2 mm x 12 mm balloon establishing antegrade flow.  I then placed a 3 mm x 18 mm long Medtronic Onyx resolute drug-eluting stent deployed at 14 atm (3.1 mm) resulting reduction of a 99% stenosis to 0% residual.  Following this I direct stented an area of an intraluminal filling defect that appeared to be calcium with a 2.75 mm x 16 mm long Synergy drug-eluting  stent deployed at 14 atm (2.8 mm) resulting reduction of a 75% mid followed by 99% mid sequential SVG to OM 2 and PDA with excellent flow.  The patient tolerated procedure well.  The guidewire and catheter were removed and the sheaths were secured in place.   Mr. Danzer has an occluded RCA with an occluded second diagonal branch vein graft at the aorta and high-grade disease in a sequential vein graft supplying the second obtuse marginal branch and PDA.  His filling pressures are fairly low.  I placed 2 sequential drug-eluting stents with excellent result.  It is unclear whether his MR is structural or functional but I suspect he may require a transesophageal echo to further evaluate.  The sheath will be removed once the ACT falls below 170 and pressure held.  He will be gently hydrated.  He will need to be treated with an oral anticoagulant for his A. fib as  well as aspirin and Plavix for 30 days after which the aspirin can be discontinued.  He will need at least 12 months of Plavix and long-term oral anticoagulation. Quay Burow. MD, [facility] 09/09/2019 11:41 AM   DG Foot 2 Views Left  Result Date: 09/10/2019 CLINICAL DATA:  Pain and redness first digit EXAM: LEFT FOOT - 2 VIEW COMPARISON:  None. FINDINGS: Frontal and lateral views were obtained. No fracture or dislocation. Joint spaces appear unremarkable. No erosive change or bony destruction. No radiopaque foreign body. There is a small inferior calcaneal spur. There are multiple foci of arterial vascular calcification. No evidence soft tissue air. IMPRESSION: Multiple foci of arterial vascular calcification. No fracture or dislocation. No bony destruction or erosion. No soft tissue air. Inferior calcaneal spur present. Electronically Signed   By: Lowella Grip III M.D.   On: 09/10/2019 10:21     Medical Consultants:    None.  Anti-Infectives:   none  Subjective:    Kaylum Shrum relates he continues to have toe pain.  Objective:    Vitals:   09/10/19 1133 09/10/19 1608 09/10/19 2035 09/11/19 0456  BP: 115/77 140/82 110/85 119/70  Pulse: 92 77 70 70  Resp: $Remo'16 19 16 19  'OOjXn$ Temp: 97.9 F (36.6 C) 98.2 F (36.8 C) 97.8 F (36.6 C) 97.7 F (36.5 C)  TempSrc: Oral Oral Oral Oral  SpO2: 94%  98% 97%  Weight:    106.1 kg  Height:       SpO2: 97 % O2 Flow Rate (L/min): 2 L/min   Intake/Output Summary (Last 24 hours) at 09/11/2019 0713 Last data filed at 09/11/2019 0600 Gross per 24 hour  Intake 492.92 ml  Output 1200 ml  Net -707.08 ml   Filed Weights   09/09/19 0152 09/10/19 0504 09/11/19 0456  Weight: 107.5 kg 106.1 kg 106.1 kg    Exam: General exam: In no acute distress. Respiratory system: Good air movement and clear to auscultation. Cardiovascular system: S1 & S2 heard, RRR. No JVD. Gastrointestinal system: Abdomen is nondistended, soft and nontender.  Central  nervous system: Alert and oriented. No focal neurological deficits. Extremities: No pedal edema. Skin: The big toe on the lateral side of the ear is erythematous but not warm not exquisitely tender to touch it is tender to palpation and is able to remove the great.   Data Reviewed:    Labs: Basic Metabolic Panel: Recent Labs  Lab 09/07/19 2239 09/07/19 2239 09/08/19 1254 09/08/19 1254 09/09/19 0707 09/09/19 0707 09/09/19 0959 09/09/19 0959 09/09/19 1001 09/09/19 1001 09/09/19 1002 09/10/19  2355 09/11/19 0223  NA 138   < > 140   < > 139  --  140  --  141  --  140 137  --   K 3.2*   < > 3.8   < > 3.6   < > 3.8   < > 3.6   < > 3.6 3.6  --   CL 102  --  103  --  101  --   --   --   --   --   --  104  --   CO2 27  --  27  --  27  --   --   --   --   --   --  25  --   GLUCOSE 148*  --  133*  --  139*  --   --   --   --   --   --  125*  --   BUN 24*  --  16  --  14  --   --   --   --   --   --  11  --   CREATININE 0.92  --  0.92  --  1.01  --   --   --   --   --   --  0.92  --   CALCIUM 8.9  --  9.3  --  9.1  --   --   --   --   --   --  8.9  --   MG  --   --   --   --  1.8  --   --   --   --   --   --   --  1.8   < > = values in this interval not displayed.   GFR Estimated Creatinine Clearance: 79.4 mL/min (by C-G formula based on SCr of 0.92 mg/dL). Liver Function Tests: Recent Labs  Lab 09/07/19 2239  AST 31  ALT 34  ALKPHOS 54  BILITOT <0.1*  PROT 6.9  ALBUMIN 3.8   Recent Labs  Lab 09/07/19 1621 09/07/19 2239  LIPASE 12 26   No results for input(s): AMMONIA in the last 168 hours. Coagulation profile No results for input(s): INR, PROTIME in the last 168 hours. COVID-19 Labs  No results for input(s): DDIMER, FERRITIN, LDH, CRP in the last 72 hours.  Lab Results  Component Value Date   [place] NEGATIVE 09/08/2019    CBC: Recent Labs  Lab 09/07/19 1621 09/07/19 1621 09/07/19 2239 09/07/19 2239 09/08/19 1254 09/08/19 1254 09/09/19 0707  09/09/19 0959 09/09/19 1001 09/09/19 1002 09/10/19 0418  WBC 9.0  --  9.0  --  9.4  --  10.5  --   --   --  8.8  NEUTROABS 5,715  --  5.5  --   --   --   --   --   --   --   --   HGB 13.5   < > 13.5   < > 13.6   < > 13.3 13.6 13.9 13.6 13.2  HCT 40.6   < > 41.2   < > 42.2   < > 41.3 40.0 41.0 40.0 39.7  MCV 87.9  --  89.2  --  89.4  --  89.8  --   --   --  88.2  PLT 150  --  143*  --  142*  --  134*  --   --   --  129*   < > = values in this interval not displayed.   Cardiac Enzymes: Recent Labs  Lab 09/07/19 1621  TROPONINI 514*   BNP (last 3 results) No results for input(s): PROBNP in the last 8760 hours. CBG: Recent Labs  Lab 09/08/19 1239 09/09/19 1157  GLUCAP 134* 123*   D-Dimer: No results for input(s): DDIMER in the last 72 hours. Hgb A1c: No results for input(s): HGBA1C in the last 72 hours. Lipid Profile: Recent Labs    09/08/19 1254  CHOL 135  HDL 42  LDLCALC 76  TRIG 85  CHOLHDL 3.2   Thyroid function studies: No results for input(s): TSH, T4TOTAL, T3FREE, THYROIDAB in the last 72 hours.  Invalid input(s): FREET3 Anemia work up: No results for input(s): VITAMINB12, FOLATE, FERRITIN, TIBC, IRON, RETICCTPCT in the last 72 hours. Sepsis Labs: Recent Labs  Lab 09/07/19 2239 09/08/19 1254 09/09/19 0707 09/10/19 0418  WBC 9.0 9.4 10.5 8.8   Microbiology Recent Results (from the past 240 hour(s))  SARS Coronavirus 2 by RT PCR (hospital order, performed in [facility] hospital lab) Nasopharyngeal Nasopharyngeal Swab     Status: None   Collection Time: 09/08/19  3:20 AM   Specimen: Nasopharyngeal Swab  Result Value Ref Range Status   SARS Coronavirus 2 NEGATIVE NEGATIVE Final    Comment: (NOTE) SARS-CoV-2 target nucleic acids are NOT DETECTED.  The SARS-CoV-2 RNA is generally detectable in upper and lower respiratory specimens during the acute phase of infection. The lowest concentration of SARS-CoV-2 viral copies this assay can detect is  250 copies / mL. A negative result does not preclude SARS-CoV-2 infection and should not be used as the sole basis for treatment or other patient management decisions.  A negative result may occur with improper specimen collection / handling, submission of specimen other than nasopharyngeal swab, presence of viral mutation(s) within the areas targeted by this assay, and inadequate number of viral copies (<250 copies / mL). A negative result must be combined with clinical observations, patient history, and epidemiological information.  Fact Sheet for Patients:   StrictlyIdeas.no  Fact Sheet for Healthcare Providers: BankingDealers.co.za  This test is not yet approved or  cleared by the [place] FDA and has been authorized for detection and/or diagnosis of SARS-CoV-2 by FDA under an Emergency Use Authorization (EUA).  This EUA will remain in effect (meaning this test can be used) for the duration of the COVID-19 declaration under Section 564(b)(1) of the Act, 21 U.S.C. section 360bbb-3(b)(1), unless the authorization is terminated or revoked sooner.  Performed at [facility], [address] [place], [place] [address]      Medications:   . amiodarone  200 mg Oral BID  . apixaban  5 mg Oral BID  . aspirin EC  81 mg Oral QHS  . atorvastatin  80 mg Oral Daily  . carvedilol  6.25 mg Oral BID WC  . clopidogrel  75 mg Oral Q breakfast  . colchicine  0.3 mg Oral BID  . ezetimibe  10 mg Oral Daily  . finasteride  5 mg Oral Daily  . levothyroxine  75 mcg Oral QAC breakfast  . liothyronine  10 mcg Oral Daily  . loratadine  10 mg Oral Daily  . sacubitril-valsartan  1 tablet Oral BID  . sodium chloride flush  3 mL Intravenous Q12H  . spironolactone  12.5 mg Oral Daily  . tamsulosin  0.4 mg Oral QHS   Continuous Infusions: . sodium chloride    .  sodium chloride 20 mL/hr at 09/10/19 1130      LOS: 3 days   Charlynne Cousins  [facility]  09/11/2019, 7:13 AM

## 2019-09-12 DIAGNOSIS — Z9989 Dependence on other enabling machines and devices: Secondary | ICD-10-CM

## 2019-09-12 DIAGNOSIS — I4819 Other persistent atrial fibrillation: Secondary | ICD-10-CM

## 2019-09-12 DIAGNOSIS — G4733 Obstructive sleep apnea (adult) (pediatric): Secondary | ICD-10-CM

## 2019-09-12 DIAGNOSIS — I25700 Atherosclerosis of coronary artery bypass graft(s), unspecified, with unstable angina pectoris: Secondary | ICD-10-CM

## 2019-09-12 DIAGNOSIS — I5022 Chronic systolic (congestive) heart failure: Secondary | ICD-10-CM

## 2019-09-12 NOTE — Progress Notes (Signed)
[facility] PROGRESS NOTE    Progress Note  Mikaele Stecher  MCN:[id number] DOB: Nov 21, 1940 DOA: 09/07/2019 PCP: [name], DO     Brief Narrative:   Randeep Biondolillo is an 79 y.o. male past medical history of essential hypertension, hyperlipidemia diet-controlled diabetes mellitus type 2 CAD and peripheral vascular disease status post left carotid endarterectomy morbid obesity obstructive sleep apnea comes in with epigastric abdominal pain that started 1 week prior to admission.  He tachycardic and tachypneic with a low potassium troponin I 300 and EKG A. fib was started on IV heparin  Assessment/Plan:   NSTEMI (non-ST elevated myocardial infarction) ([place]); 2D echo showed an EF of 35% with severe hypokinesia of the posterior wall. Admitted to telemetry, on IV heparin, aspirin, statins and nitroglycerin. Cardiology was consulted recommended diagnostic cardiac cath on 09/09/2019 with 2 drug-eluting stents  He is currently symptom-free we will continue aspirin and Plavix for 30 days along with Eliquis, drop aspirin in 30 days and then continue Plavix and Eliquis, continue statins.  Paroxysmal atrial fibrillation: Patient has a prior history of postoperative atrial fibrillation following CABG with no recurrent episodes since then.   Chads vas score of at least 5, now on Eliquis. Cardiology recommended a TEE for further evaluation as A. fib may be contributing to his decreased EF.  Acute combined systolic and diastolic heart failure: TEE will be needed for further evaluation of new finding of potential mitral valve dysfunction.  There is a concern for new MR leading to systolic dysfunction.  CAD: Continue statins.  Essential hypertension: Holding antihypertensive medication, blood pressure seems to be stable.  Diabetes mellitus type 2: With an A1c of 6.6 continue carb modified diet after cardiac cath.  Hypothyroidism: Continue Synthroid.  Chronic  thrombocytopenia: No signs of bleeding. See to chroni compare to 2019.  Upper abdominal discomfort: Abdominal pain seems to be resolved.  Obstructive sleep apnea: Continue CPAP.  Right great toe pain: X-ray showed no acute fractures dislocation continue statins, aspirin and Plavix. Currently on colchicine for possible gout.    DVT prophylaxis: Eliquis Family Communication:none Status is: Inpatient  Remains inpatient appropriate because:Hemodynamically unstable   Dispo: The patient is from: Home              Anticipated d/c is to: Home              Anticipated d/c date is: 2 days              Patient currently is not medically stable to d/c.  For TEE with cardioversion on 09/13/2019. Code Status:     Code Status Orders  (From admission, onward)         Start     Ordered   09/08/19 1227  Full code  Continuous        09/08/19 1229        Code Status History    This patient has a current code status but no historical code status.   Advance Care Planning Activity       IV Access:    Peripheral IV   Procedures and diagnostic studies:   DG Foot 2 Views Left  Result Date: 09/10/2019 CLINICAL DATA:  Pain and redness first digit EXAM: LEFT FOOT - 2 VIEW COMPARISON:  None. FINDINGS: Frontal and lateral views were obtained. No fracture or dislocation. Joint spaces appear unremarkable. No erosive change or bony destruction. No radiopaque foreign body. There is a small inferior calcaneal spur. There are multiple foci of  arterial vascular calcification. No evidence soft tissue air. IMPRESSION: Multiple foci of arterial vascular calcification. No fracture or dislocation. No bony destruction or erosion. No soft tissue air. Inferior calcaneal spur present. Electronically Signed   By: Lowella Grip III M.D.   On: 09/10/2019 10:21     Medical Consultants:    None.  Anti-Infectives:   none  Subjective:    [name] to pain is significantly  improved.  Objective:    Vitals:   09/11/19 1947 09/12/19 0033 09/12/19 0523 09/12/19 0816  BP: 135/71 116/68 109/65 (!) 141/81  Pulse: 77 67 69   Resp: 20 17 18    Temp: 97.9 F (36.6 C) 98.1 F (36.7 C) (!) 97.4 F (36.3 C)   TempSrc: Oral Oral Oral   SpO2: 99% 99% 100%   Weight:   106.5 kg   Height:       SpO2: 100 % O2 Flow Rate (L/min): 2 L/min   Intake/Output Summary (Last 24 hours) at 09/12/2019 0900 Last data filed at 09/12/2019 0655 Gross per 24 hour  Intake 720 ml  Output --  Net 720 ml   Filed Weights   09/10/19 0504 09/11/19 0456 09/12/19 0523  Weight: 106.1 kg 106.1 kg 106.5 kg    Exam: General exam: In no acute distress. Respiratory system: Good air movement and clear to auscultation. Cardiovascular system: S1 & S2 heard, RRR. No JVD. Gastrointestinal system: Abdomen is nondistended, soft and nontender.  Extremities: No pedal edema. Skin: No rashes, lesions or ulcers   Data Reviewed:    Labs: Basic Metabolic Panel: Recent Labs  Lab 09/07/19 2239 09/07/19 2239 09/08/19 1254 09/08/19 1254 09/09/19 0707 09/09/19 0707 09/09/19 0959 09/09/19 0959 09/09/19 1001 09/09/19 1001 09/09/19 1002 09/10/19 0418 09/11/19 0223  NA 138   < > 140   < > 139  --  140  --  141  --  140 137  --   K 3.2*   < > 3.8   < > 3.6   < > 3.8   < > 3.6   < > 3.6 3.6  --   CL 102  --  103  --  101  --   --   --   --   --   --  104  --   CO2 27  --  27  --  27  --   --   --   --   --   --  25  --   GLUCOSE 148*  --  133*  --  139*  --   --   --   --   --   --  125*  --   BUN 24*  --  16  --  14  --   --   --   --   --   --  11  --   CREATININE 0.92  --  0.92  --  1.01  --   --   --   --   --   --  0.92  --   CALCIUM 8.9  --  9.3  --  9.1  --   --   --   --   --   --  8.9  --   MG  --   --   --   --  1.8  --   --   --   --   --   --   --  1.8   < > =  values in this interval not displayed.   GFR Estimated Creatinine Clearance: 79.6 mL/min (by C-G formula based on SCr  of 0.92 mg/dL). Liver Function Tests: Recent Labs  Lab 09/07/19 2239  AST 31  ALT 34  ALKPHOS 54  BILITOT <0.1*  PROT 6.9  ALBUMIN 3.8   Recent Labs  Lab 09/07/19 1621 09/07/19 2239  LIPASE 12 26   No results for input(s): AMMONIA in the last 168 hours. Coagulation profile No results for input(s): INR, PROTIME in the last 168 hours. COVID-19 Labs  No results for input(s): DDIMER, FERRITIN, LDH, CRP in the last 72 hours.  Lab Results  Component Value Date   [place] NEGATIVE 09/08/2019    CBC: Recent Labs  Lab 09/07/19 1621 09/07/19 1621 09/07/19 2239 09/07/19 2239 09/08/19 1254 09/08/19 1254 09/09/19 0707 09/09/19 0959 09/09/19 1001 09/09/19 1002 09/10/19 0418  WBC 9.0  --  9.0  --  9.4  --  10.5  --   --   --  8.8  NEUTROABS 5,715  --  5.5  --   --   --   --   --   --   --   --   HGB 13.5   < > 13.5   < > 13.6   < > 13.3 13.6 13.9 13.6 13.2  HCT 40.6   < > 41.2   < > 42.2   < > 41.3 40.0 41.0 40.0 39.7  MCV 87.9  --  89.2  --  89.4  --  89.8  --   --   --  88.2  PLT 150  --  143*  --  142*  --  134*  --   --   --  129*   < > = values in this interval not displayed.   Cardiac Enzymes: Recent Labs  Lab 09/07/19 1621  TROPONINI 514*   BNP (last 3 results) No results for input(s): PROBNP in the last 8760 hours. CBG: Recent Labs  Lab 09/08/19 1239 09/09/19 1157  GLUCAP 134* 123*   D-Dimer: No results for input(s): DDIMER in the last 72 hours. Hgb A1c: No results for input(s): HGBA1C in the last 72 hours. Lipid Profile: No results for input(s): CHOL, HDL, LDLCALC, TRIG, CHOLHDL, LDLDIRECT in the last 72 hours. Thyroid function studies: No results for input(s): TSH, T4TOTAL, T3FREE, THYROIDAB in the last 72 hours.  Invalid input(s): FREET3 Anemia work up: No results for input(s): VITAMINB12, FOLATE, FERRITIN, TIBC, IRON, RETICCTPCT in the last 72 hours. Sepsis Labs: Recent Labs  Lab 09/07/19 2239 09/08/19 1254 09/09/19 0707  09/10/19 0418  WBC 9.0 9.4 10.5 8.8   Microbiology Recent Results (from the past 240 hour(s))  SARS Coronavirus 2 by RT PCR (hospital order, performed in [facility] hospital lab) Nasopharyngeal Nasopharyngeal Swab     Status: None   Collection Time: 09/08/19  3:20 AM   Specimen: Nasopharyngeal Swab  Result Value Ref Range Status   SARS Coronavirus 2 NEGATIVE NEGATIVE Final    Comment: (NOTE) SARS-CoV-2 target nucleic acids are NOT DETECTED.  The SARS-CoV-2 RNA is generally detectable in upper and lower respiratory specimens during the acute phase of infection. The lowest concentration of SARS-CoV-2 viral copies this assay can detect is 250 copies / mL. A negative result does not preclude SARS-CoV-2 infection and should not be used as the sole basis for treatment or other patient management decisions.  A negative result may occur with improper specimen collection / handling, submission of specimen other  than nasopharyngeal swab, presence of viral mutation(s) within the areas targeted by this assay, and inadequate number of viral copies (<250 copies / mL). A negative result must be combined with clinical observations, patient history, and epidemiological information.  Fact Sheet for Patients:   StrictlyIdeas.no  Fact Sheet for Healthcare Providers: BankingDealers.co.za  This test is not yet approved or  cleared by the [place] FDA and has been authorized for detection and/or diagnosis of SARS-CoV-2 by FDA under an Emergency Use Authorization (EUA).  This EUA will remain in effect (meaning this test can be used) for the duration of the COVID-19 declaration under Section 564(b)(1) of the Act, 21 U.S.C. section 360bbb-3(b)(1), unless the authorization is terminated or revoked sooner.  Performed at [facility], [address] [place], [place] [address]      Medications:   . amiodarone  200 mg Oral BID  .  apixaban  5 mg Oral BID  . aspirin EC  81 mg Oral QHS  . atorvastatin  80 mg Oral Daily  . carvedilol  6.25 mg Oral BID WC  . clopidogrel  75 mg Oral Q breakfast  . colchicine  0.3 mg Oral Daily  . ezetimibe  10 mg Oral Daily  . finasteride  5 mg Oral Daily  . levothyroxine  75 mcg Oral QAC breakfast  . liothyronine  10 mcg Oral Daily  . loratadine  10 mg Oral Daily  . sacubitril-valsartan  1 tablet Oral BID  . sodium chloride flush  3 mL Intravenous Q12H  . spironolactone  12.5 mg Oral Daily  . tamsulosin  0.4 mg Oral QHS   Continuous Infusions: . sodium chloride    . sodium chloride 20 mL/hr at 09/10/19 1130      LOS: 4 days   Charlynne Cousins  [facility]  09/12/2019, 9:00 AM

## 2019-09-12 NOTE — Evaluation (Signed)
Physical Therapy Evaluation Patient Details Name: Raymond Christensen MRN: [id number] DOB: Oct 18, 1940 Today's Date: 09/12/2019   History of Present Illness  Raymond Christensen is an 79 y.o. male past medical history of essential hypertension, hyperlipidemia diet-controlled diabetes mellitus type 2 CAD and peripheral vascular disease status post left carotid endarterectomy morbid obesity obstructive sleep apnea comes in with epigastric abdominal pain that started 1 week prior to admission. Admitted with NSTEMI  Clinical Impression   Patient evaluated by Physical Therapy with no further acute PT needs identified. All education has been completed and the patient has no further questions. L foot first ray pain strongly resembles gout; See below for any follow-up Physical Therapy or equipment needs. PT is signing off. Thank you for this referral.     Follow Up Recommendations No PT follow up;Other (comment) (Consider Cardiac Rehab Phase 2; Rec f/u for L foot pain)    Equipment Recommendations  None recommended by PT    Recommendations for Other Services       Precautions / Restrictions Precautions Precautions: Other (comment) Precaution Comments: monitor HR      Mobility  Bed Mobility                  Transfers Overall transfer level: Independent Equipment used: None             General transfer comment: no difficulty  Ambulation/Gait Ambulation/Gait assistance: Supervision;Modified independent (Device/Increase time) Gait Distance (Feet): 900 Feet (or greater than; to [employer]	[place] and back) Assistive device: None;IV Radiation protection practitioner Pattern/deviations: Step-through pattern Gait velocity: approaching WNL   General Gait Details: Notable slight decr stance time L due to great toe pain with weight bearing; DOE 2/4, noticeable to observer, not needing to stop and sit down; able to go up and down a small incline  Financial trader [place] (Stroke  Patients Only)       Balance                                             Pertinent Vitals/Pain Pain Assessment: Faces Faces Pain Scale: Hurts a little bit Pain Location: L great toe Pain Descriptors / Indicators: Aching Pain Intervention(s): Monitored during session    Home Living Family/patient expects to be discharged to:: Private residence Living Arrangements: Spouse/significant other Available Help at Discharge: Family Type of Home: House Home Access: Stairs to enter   Technical brewer of Steps:  (a few) Home Layout: One level        Prior Function Level of Independence: Independent         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        Extremity/Trunk Assessment   Upper Extremity Assessment Upper Extremity Assessment: Overall WFL for tasks assessed    Lower Extremity Assessment Lower Extremity Assessment: LLE deficits/detail LLE Deficits / Details: Painful L great toe with weight bearing and ambulating; notable erythema at metatarsophalangeal joint; likely gout       Communication   Communication: No difficulties  Cognition Arousal/Alertness: Awake/alert Behavior During Therapy: WFL for tasks assessed/performed Overall Cognitive Status: Within Functional Limits for tasks assessed  General Comments General comments (skin integrity, edema, etc.): Walked on room air; HR incr to 120s with activity; brief period of HR getting to 130 (observed max); Dr. Sallyanne Kuster aware    Exercises     Assessment/Plan    PT Assessment Patent does not need any further PT services  PT Problem List         PT Treatment Interventions      PT Goals (Current goals can be found in the Care Plan section)  Acute Rehab PT Goals Patient Stated Goal: Hopes tomorrow's procedure goes well PT Goal Formulation: All assessment and education complete, DC therapy    Frequency      Barriers to discharge        Co-evaluation               AM-PAC PT "6 Clicks" Mobility  Outcome Measure Help needed turning from your back to your side while in a flat bed without using bedrails?: None Help needed moving from lying on your back to sitting on the side of a flat bed without using bedrails?: None Help needed moving to and from a bed to a chair (including a wheelchair)?: None Help needed standing up from a chair using your arms (e.g., wheelchair or bedside chair)?: None Help needed to walk in hospital room?: None Help needed climbing 3-5 steps with a railing? : None 6 Click Score: 24    End of Session   Activity Tolerance: Patient tolerated treatment well Patient left: in chair;with call bell/phone within reach Nurse Communication: Mobility status PT Visit Diagnosis: Other abnormalities of gait and mobility (R26.89)    Time: 7867-5449 PT Time Calculation (min) (ACUTE ONLY): 22 min   Charges:   PT Evaluation $PT Eval Low Complexity: [place], PT  Acute Rehabilitation Services Pager [phone] Office [phone]   Colletta [place] 09/12/2019, 12:34 PM

## 2019-09-12 NOTE — Progress Notes (Signed)
Progress Note  Patient Name: Raymond Christensen Date of Encounter: 09/12/2019  [facility] Cardiologist: Winside, MD   Subjective   Feels well, L MTP pain has improved. Walked briskly with PT. Ventricular rate 80s at rest, 120s with walking. No angina.  Inpatient Medications    Scheduled Meds: . amiodarone  200 mg Oral BID  . apixaban  5 mg Oral BID  . aspirin EC  81 mg Oral QHS  . atorvastatin  80 mg Oral Daily  . carvedilol  6.25 mg Oral BID WC  . clopidogrel  75 mg Oral Q breakfast  . colchicine  0.3 mg Oral Daily  . ezetimibe  10 mg Oral Daily  . finasteride  5 mg Oral Daily  . levothyroxine  75 mcg Oral QAC breakfast  . liothyronine  10 mcg Oral Daily  . loratadine  10 mg Oral Daily  . sacubitril-valsartan  1 tablet Oral BID  . sodium chloride flush  3 mL Intravenous Q12H  . spironolactone  12.5 mg Oral Daily  . tamsulosin  0.4 mg Oral QHS   Continuous Infusions: . sodium chloride    . sodium chloride 20 mL/hr at 09/10/19 1130   PRN Meds: sodium chloride, acetaminophen, fluticasone, morphine injection, nitroGLYCERIN, ondansetron (ZOFRAN) IV, sodium chloride flush, traMADol   Vital Signs    Vitals:   09/11/19 1947 09/12/19 0033 09/12/19 0523 09/12/19 0816  BP: 135/71 116/68 109/65 (!) 141/81  Pulse: 77 67 69   Resp: 20 17 18    Temp: 97.9 F (36.6 C) 98.1 F (36.7 C) (!) 97.4 F (36.3 C)   TempSrc: Oral Oral Oral   SpO2: 99% 99% 100%   Weight:   106.5 kg   Height:        Intake/Output Summary (Last 24 hours) at 09/12/2019 0936 Last data filed at 09/12/2019 0655 Gross per 24 hour  Intake 720 ml  Output --  Net 720 ml   Last 3 Weights 09/12/2019 09/11/2019 09/10/2019  Weight (lbs) 234 lb 14.4 oz 233 lb 14.4 oz 234 lb  Weight (kg) 106.55 kg 106.096 kg 106.142 kg      Telemetry    AF with mostly controlled rate  - Personally Reviewed  ECG    No new tracing - Personally Reviewed  Physical Exam  Obese GEN: No acute distress.   Neck:  No JVD Cardiac: RRR, no murmurs, rubs, or gallops.  Respiratory: Clear to auscultation bilaterally. GI: Soft, nontender, non-distended  MS: No edema; No deformity. Redness and tenderness L MTP (improving) Neuro:  Nonfocal  Psych: Normal affect   Labs    High Sensitivity Troponin:   Recent Labs  Lab 09/07/19 2240 09/08/19 0042  TROPONINIHS 314* 304*      Chemistry Recent Labs  Lab 09/07/19 2239 09/07/19 2239 09/08/19 1254 09/08/19 1254 09/09/19 0707 09/09/19 0959 09/09/19 1001 09/09/19 1002 09/10/19 0418  NA 138   < > 140   < > 139   < > 141 140 137  K 3.2*   < > 3.8   < > 3.6   < > 3.6 3.6 3.6  CL 102   < > 103  --  101  --   --   --  104  CO2 27   < > 27  --  27  --   --   --  25  GLUCOSE 148*   < > 133*  --  139*  --   --   --  125*  BUN 24*   < > 16  --  14  --   --   --  11  CREATININE 0.92   < > 0.92  --  1.01  --   --   --  0.92  CALCIUM 8.9   < > 9.3  --  9.1  --   --   --  8.9  PROT 6.9  --   --   --   --   --   --   --   --   ALBUMIN 3.8  --   --   --   --   --   --   --   --   AST 31  --   --   --   --   --   --   --   --   ALT 34  --   --   --   --   --   --   --   --   ALKPHOS 54  --   --   --   --   --   --   --   --   BILITOT <0.1*  --   --   --   --   --   --   --   --   GFRNONAA >60   < > >60  --  >60  --   --   --  >60  GFRAA >60   < > >60  --  >60  --   --   --  >60  ANIONGAP 9   < > 10  --  11  --   --   --  8   < > = values in this interval not displayed.     Hematology Recent Labs  Lab 09/08/19 1254 09/08/19 1254 09/09/19 0707 09/09/19 0959 09/09/19 1001 09/09/19 1002 09/10/19 0418  WBC 9.4  --  10.5  --   --   --  8.8  RBC 4.72  --  4.60  --   --   --  4.50  HGB 13.6   < > 13.3   < > 13.9 13.6 13.2  HCT 42.2   < > 41.3   < > 41.0 40.0 39.7  MCV 89.4  --  89.8  --   --   --  88.2  MCH 28.8  --  28.9  --   --   --  29.3  MCHC 32.2  --  32.2  --   --   --  33.2  RDW 13.6  --  13.7  --   --   --  13.6  PLT 142*  --  134*  --   --    --  129*   < > = values in this interval not displayed.    BNP Recent Labs  Lab 09/08/19 1800  BNP 264.7*     DDimer No results for input(s): DDIMER in the last 168 hours.   Radiology    DG Foot 2 Views Left  Result Date: 09/10/2019 CLINICAL DATA:  Pain and redness first digit EXAM: LEFT FOOT - 2 VIEW COMPARISON:  None. FINDINGS: Frontal and lateral views were obtained. No fracture or dislocation. Joint spaces appear unremarkable. No erosive change or bony destruction. No radiopaque foreign body. There is a small inferior calcaneal spur. There are multiple foci of arterial vascular calcification. No evidence soft tissue air. IMPRESSION: Multiple foci of arterial  vascular calcification. No fracture or dislocation. No bony destruction or erosion. No soft tissue air. Inferior calcaneal spur present. Electronically Signed   By: Lowella Grip III M.D.   On: 09/10/2019 10:21    Cardiac Studies   Echocardiogram 09/08/19: 1. Poor quality study despite contrast. EF 35-40% with severe HK of the  posterior wall. Moderate to severe MR related with eccentric jet that is  poorly quantitated. Would strongly consider TEE.  2. Left ventricular ejection fraction, by estimation, is 35 to 40%. The  left ventricle has moderately decreased function. The left ventricle  demonstrates regional wall motion abnormalities (see scoring  diagram/findings for description). Left ventricular  diastolic function could not be evaluated.  3. Right ventricular systolic function is normal. The right ventricular  size is normal. There is normal pulmonary artery systolic pressure. The  estimated right ventricular systolic pressure is 54.6 mmHg.  4. Left atrial size was moderately dilated.  5. Right atrial size was mildly dilated.  6. There is moderate to severe MR that is poorly quantitated, posteriorly  directed, and eccentric. Suspect this is related to restricted movement of  the PMVL is systole due to  posterior wall hypokinesis (IIIB pathology).  There were poor acoustic windows  for TTE as well. Would consider a TEE for better evaluation of the  pathology if clinically indicated. The mitral valve is abnormal. Moderate  to severe mitral valve regurgitation. No evidence of mitral stenosis.  7. The aortic valve is tricuspid. Aortic valve regurgitation is not  visualized. Mild aortic valve sclerosis is present, with no evidence of  aortic valve stenosis.  8. The inferior vena cava is normal in size with greater than 50%  respiratory variability, suggesting right atrial pressure of 3 mmHg.   R/LHC 09/09/19:  Mid LM to Dist LM lesion is 70% stenosed.  1st Mrg lesion is 75% stenosed.  2nd Mrg-1 lesion is 90% stenosed.  2nd Mrg-2 lesion is 95% stenosed.  Mid RCA to Dist RCA lesion is 100% stenosed.  Prox RCA to Mid RCA lesion is 90% stenosed.  2nd Diag lesion is 75% stenosed.  Origin to Prox Graft lesion between 2nd Mrg and RPDA is 99% stenosed.  Prox Graft to Mid Graft lesion between Ramus and 2nd Mrg is 75% stenosed.  A drug-eluting stent was successfully placed using a STENT RESOLUTE ONYX 3.0X18.  Post intervention, there is a 0% residual stenosis.  A drug-eluting stent was successfully placed.  Post intervention, there is a 0% residual stenosis.  Hemodynamic findings consistent with mitral valve regurgitation. IMPRESSION:Mr. Befort has an occluded RCA with an occluded second diagonal branch vein graft at the aorta and high-grade disease in a sequential vein graft supplying the second obtuse marginal branch and PDA. His filling pressures are fairly low. I placed 2 sequential drug-eluting stents with excellent result. It is unclear whether his MR is structural or functional but I suspect he may require a transesophageal echo to further evaluate. The sheath will be removed once the ACT falls below 170 and pressure held. He will be gently hydrated. He will need to be  treated with an oral anticoagulant for his A. fib as well as aspirin and Plavix for 30 days after which the aspirin can be discontinued. He will need at least 12 months of Plavix and long-term oral anticoagulation. Diagnostic Dominance: Right  Intervention      Patient Profile     79 y.o. male with a history of MI in 1994 c/b cardiac arrest s/p  lytics and PTCA, CABG in 2001 (LIMA to LAD, SVG to OM-2, SVG to right PDA, SVG to D-2) with post-op AF, carotid stenosis s/p L CEA and right subclavian stenosis, diet-controlled DM, HTN, obesity, OSA on CPAP, mild cognitive impairment, depression/mood disorder, and hypothyroidism. Admitted with new onsetafib,elevated troponinI and new reducedEF, 35-40%withmoderate to severe MR.Cath 09/09/2019 led to Stent SVG to OM/PDA with resolution of angina  Assessment & Plan    1. New onset atrial fibrillation: plan for TEE guided DCCV tomorrow 1130h. This procedure has been fully reviewed with the patient and written informed consent has been obtained..On apixaban and carvedilol. Rate control is good at rest, mediocre with activity.  2. Unstable angina in patient with CAD s/p CABG: s/p 75% mid and 99% distal SVG to OM/PDA stenosis both managed with separate PCI/DES.  Now asymptomatic. On statin and beta blocker. Continue aspirin and plavix - plan to stop aspirin 10/10/19 and continue plavix along with apixaban  3. Acute combined CHF: Clinically euvolemic. Echo this admission showed EF 35-40% - felt to be both ischemic in nature, as well as rate related due to new onset atrial fibrillation. He was transitioned from ramipril to entresto and started on spironolactone and carvedilol. Will need early reevaluation after DC for med titration and labs. Recheck echo 3 months. Consider SGLT2 inh.  4. Mitral regurgitation: moderate to severe on echo this admission. Unclear if entirely secondary to the LV dysfunction, TEE in AM.  5. HTN: BP stable  6. HLD: LDL  76 this admission; goal <70. Simvastatin transitioned to atorvastatin 80mg  daily and home zetia continued. Recheck in 3 months.  7. DM type 2: A1C 6.6 06/2019. Great candidate for SGLT2 inhibitor, after steady state achieved with HF med changes.     For questions or updates, please contact [place] Please consult [email or web address] for contact info under        Signed, Sanda Klein, MD  09/12/2019, 9:36 AM

## 2019-09-13 ENCOUNTER — Inpatient Hospital Stay (HOSPITAL_COMMUNITY): Payer: Medicare (Managed Care) | Admitting: Anesthesiology

## 2019-09-13 ENCOUNTER — Encounter (HOSPITAL_COMMUNITY)
Admission: EM | Disposition: A | Discharge status: Home / Self Care | Payer: Self-pay | Source: Home / Self Care | Attending: Internal Medicine

## 2019-09-13 ENCOUNTER — Encounter (HOSPITAL_COMMUNITY): Payer: Self-pay | Admitting: Internal Medicine

## 2019-09-13 ENCOUNTER — Inpatient Hospital Stay (HOSPITAL_COMMUNITY): Payer: Medicare (Managed Care)

## 2019-09-13 DIAGNOSIS — I340 Nonrheumatic mitral (valve) insufficiency: Secondary | ICD-10-CM

## 2019-09-13 DIAGNOSIS — I4891 Unspecified atrial fibrillation: Secondary | ICD-10-CM

## 2019-09-13 DIAGNOSIS — I200 Unstable angina: Secondary | ICD-10-CM

## 2019-09-13 HISTORY — PX: TEE WITHOUT CARDIOVERSION: SHX5443

## 2019-09-13 LAB — ECHO TEE
MV M vel: 4.78 m/s
MV Peak grad: 91.4 mmHg
Radius: 1 cm

## 2019-09-13 SURGERY — ECHOCARDIOGRAM, TRANSESOPHAGEAL
Anesthesia: General

## 2019-09-13 MED ORDER — BUTAMBEN-TETRACAINE-BENZOCAINE 2-2-14 % EX AERO
INHALATION_SPRAY | CUTANEOUS | Status: DC | PRN
  Administered 2019-09-13: 2 via TOPICAL

## 2019-09-13 MED ORDER — SODIUM CHLORIDE 0.9 % IV SOLN: INTRAVENOUS | Status: DC | PRN

## 2019-09-13 MED ORDER — PROPOFOL 500 MG/50ML IV EMUL
INTRAVENOUS | Status: DC | PRN
  Administered 2019-09-13: 100 ug/kg/min via INTRAVENOUS

## 2019-09-13 MED ORDER — PHENYLEPHRINE 40 MCG/ML (10ML) SYRINGE FOR IV PUSH (FOR BLOOD PRESSURE SUPPORT)
PREFILLED_SYRINGE | INTRAVENOUS | Status: DC | PRN
  Administered 2019-09-13 (×3): 80 ug via INTRAVENOUS
  Administered 2019-09-13: 120 ug via INTRAVENOUS
  Administered 2019-09-13: 80 ug via INTRAVENOUS
  Administered 2019-09-13: 160 ug via INTRAVENOUS

## 2019-09-13 MED ORDER — PROPOFOL 10 MG/ML IV BOLUS
INTRAVENOUS | Status: DC | PRN
  Administered 2019-09-13 (×2): 20 mg via INTRAVENOUS
  Administered 2019-09-13: 10 mg via INTRAVENOUS

## 2019-09-13 NOTE — Telephone Encounter (Signed)
Called left message to call back 

## 2019-09-13 NOTE — H&P (View-Only) (Signed)
Progress Note  Patient Name: Raymond Christensen Date of Encounter: 09/13/2019  [facility] Cardiologist: Sinclair Grooms, MD   Subjective   Feeling well. No chest pain, sob or palpitations.   Inpatient Medications    Scheduled Meds: . amiodarone  200 mg Oral BID  . apixaban  5 mg Oral BID  . aspirin EC  81 mg Oral QHS  . atorvastatin  80 mg Oral Daily  . carvedilol  6.25 mg Oral BID WC  . clopidogrel  75 mg Oral Q breakfast  . colchicine  0.3 mg Oral Daily  . ezetimibe  10 mg Oral Daily  . finasteride  5 mg Oral Daily  . levothyroxine  75 mcg Oral QAC breakfast  . liothyronine  10 mcg Oral Daily  . loratadine  10 mg Oral Daily  . sacubitril-valsartan  1 tablet Oral BID  . sodium chloride flush  3 mL Intravenous Q12H  . spironolactone  12.5 mg Oral Daily  . tamsulosin  0.4 mg Oral QHS   Continuous Infusions: . sodium chloride    . sodium chloride 20 mL/hr at 09/10/19 1130   PRN Meds: sodium chloride, acetaminophen, fluticasone, morphine injection, nitroGLYCERIN, ondansetron (ZOFRAN) IV, sodium chloride flush, traMADol   Vital Signs    Vitals:   09/12/19 1947 09/12/19 2348 09/13/19 0449 09/13/19 0757  BP: 128/67 108/69 117/76 127/74  Pulse: 68 73 67 71  Resp: 18 17 18 18   Temp: 98.2 F (36.8 C) 98.3 F (36.8 C) 97.8 F (36.6 C) 97.6 F (36.4 C)  TempSrc: Oral Oral Oral Oral  SpO2: 100% 100%  98%  Weight:   107.3 kg   Height:        Intake/Output Summary (Last 24 hours) at 09/13/2019 0903 Last data filed at 09/13/2019 0631 Gross per 24 hour  Intake 123 ml  Output --  Net 123 ml   Last 3 Weights 09/13/2019 09/12/2019 09/11/2019  Weight (lbs) 236 lb 8 oz 234 lb 14.4 oz 233 lb 14.4 oz  Weight (kg) 107.276 kg 106.55 kg 106.096 kg      Telemetry    Afib at 70-100s - Personally Reviewed  ECG    No new tracing   Physical Exam   GEN: No acute distress.   Neck: No JVD Cardiac: Irregular, + murmurs, rubs, or gallops.  Respiratory: Clear to  auscultation bilaterally. GI: Soft, nontender, non-distended  MS: No edema; No deformity. Neuro:  Nonfocal  Psych: Normal affect   Labs    High Sensitivity Troponin:   Recent Labs  Lab 09/07/19 2240 09/08/19 0042  TROPONINIHS 314* 304*      Chemistry Recent Labs  Lab 09/07/19 2239 09/07/19 2239 09/08/19 1254 09/08/19 1254 09/09/19 0707 09/09/19 0959 09/09/19 1001 09/09/19 1002 09/10/19 0418  NA 138   < > 140   < > 139   < > 141 140 137  K 3.2*   < > 3.8   < > 3.6   < > 3.6 3.6 3.6  CL 102   < > 103  --  101  --   --   --  104  CO2 27   < > 27  --  27  --   --   --  25  GLUCOSE 148*   < > 133*  --  139*  --   --   --  125*  BUN 24*   < > 16  --  14  --   --   --  11  CREATININE 0.92   < > 0.92  --  1.01  --   --   --  0.92  CALCIUM 8.9   < > 9.3  --  9.1  --   --   --  8.9  PROT 6.9  --   --   --   --   --   --   --   --   ALBUMIN 3.8  --   --   --   --   --   --   --   --   AST 31  --   --   --   --   --   --   --   --   ALT 34  --   --   --   --   --   --   --   --   ALKPHOS 54  --   --   --   --   --   --   --   --   BILITOT <0.1*  --   --   --   --   --   --   --   --   GFRNONAA >60   < > >60  --  >60  --   --   --  >60  GFRAA >60   < > >60  --  >60  --   --   --  >60  ANIONGAP 9   < > 10  --  11  --   --   --  8   < > = values in this interval not displayed.     Hematology Recent Labs  Lab 09/08/19 1254 09/08/19 1254 09/09/19 0707 09/09/19 0959 09/09/19 1001 09/09/19 1002 09/10/19 0418  WBC 9.4  --  10.5  --   --   --  8.8  RBC 4.72  --  4.60  --   --   --  4.50  HGB 13.6   < > 13.3   < > 13.9 13.6 13.2  HCT 42.2   < > 41.3   < > 41.0 40.0 39.7  MCV 89.4  --  89.8  --   --   --  88.2  MCH 28.8  --  28.9  --   --   --  29.3  MCHC 32.2  --  32.2  --   --   --  33.2  RDW 13.6  --  13.7  --   --   --  13.6  PLT 142*  --  134*  --   --   --  129*   < > = values in this interval not displayed.    BNP Recent Labs  Lab 09/08/19 1800  BNP 264.7*      DDimer No results for input(s): DDIMER in the last 168 hours.   Radiology    No results found.  Cardiac Studies   RIGHT/LEFT HEART CATH AND CORONARY/GRAFT ANGIOGRAPHY  Conclusion    Mid LM to Dist LM lesion is 70% stenosed.  1st Mrg lesion is 75% stenosed.  2nd Mrg-1 lesion is 90% stenosed.  2nd Mrg-2 lesion is 95% stenosed.  Mid RCA to Dist RCA lesion is 100% stenosed.  Prox RCA to Mid RCA lesion is 90% stenosed.  2nd Diag lesion is 75% stenosed.  Origin to Prox Graft lesion between 2nd Mrg and RPDA is 99% stenosed.  Prox Graft to Mid Graft lesion between  [place] and 2nd Mrg is 75% stenosed.  A drug-eluting stent was successfully placed using a STENT RESOLUTE ONYX 3.0X18.  Post intervention, there is a 0% residual stenosis.  A drug-eluting stent was successfully placed.  Post intervention, there is a 0% residual stenosis.  Hemodynamic findings consistent with mitral valve regurgitation.   IMPRESSION: Mr. Eastham has an occluded RCA with an occluded second diagonal branch vein graft at the aorta and high-grade disease in a sequential vein graft supplying the second obtuse marginal branch and PDA.  His filling pressures are fairly low.  I placed 2 sequential drug-eluting stents with excellent result.  It is unclear whether his MR is structural or functional but I suspect he may require a transesophageal echo to further evaluate.  The sheath will be removed once the ACT falls below 170 and pressure held.  He will be gently hydrated.  He will need to be treated with an oral anticoagulant for his A. fib as well as aspirin and Plavix for 30 days after which the aspirin can be discontinued.  He will need at least 12 months of Plavix and long-term oral anticoagulation.  Diagnostic Dominance: Right  Intervention     Patient Profile     79 y.o.malewith a history of MI in 1994 c/b cardiac arrest s/p lytics and PTCA, CABG in 2001 (LIMA to LAD, SVG to OM-2, SVG to right  PDA, SVG to D-2) with post-op AF, carotid stenosis s/p L CEA and right subclavian stenosis, diet-controlled DM, HTN, obesity, OSA on CPAP, mild cognitive impairment, depression/mood disorder, and hypothyroidism. Admitted with new onsetafib,elevated troponinI and new reducedEF, 35-40%withmoderate to severe MR.  Assessment & Plan    1. Unstable angina with hx of CAD s/p CABG - Cath this admission as above. Severe native multivessel disease with 75% mid and 99% distal SVG to OM/PDA stenosis both managed with separate PCI/DES. - Continue triple therapy for one month and stop ASA after 1 month 10/10/19.  - No chest pain.  - Continue statin and BB.   2. New onset atrial fibrillation  - Rate controlled. Unknown duration.  - For TEE/DCCV today  - Continue Eliquis  3. Acute combined CHF - Echo this admission showed EF 35-40% - felt to be both ischemic in nature, as well as rate related due to new onset atrial fibrillation. - Ramipril changed to Entresto - Continue Core and Spironolactone - Benefit check for SGLT-2 inhibitor  4. Mitral regurgitation  - Pending TEE today  - ? Mitral clip  5. DM - A1c 6.6 - Pendign SGLT-2 inhibitor benefit  6. DM - 09/08/2019: Cholesterol 135; HDL 42; LDL Cholesterol 76; Triglycerides 85; VLDL 17  -Simvastatin transitioned to atorvastatin 80mg  daily and home zetia continued.  - Repeat labs in 6 weeks  7. HTN - BP stable on current medications  For questions or updates, please contact [place] Please consult [email or web address] for contact info under        Signed, Leanor Kail, PA  09/13/2019, 9:03 AM    I have personally seen and examined this patient. I agree with the assessment and plan as outlined above.  79 yo male s/p PCI of two lesions in his SVG to OM last week. Also with atrial fib and moderate to severe MR. Plans for DCCV today, TEE guided. Assess MV valve by TEE. Continue triple therapy for one month with ASA, Plavix and  Eliquis and stop ASA in one month. Possible d/c home today after DCCV.   Lauree Chandler  09/13/2019 9:44 AM

## 2019-09-13 NOTE — Anesthesia Procedure Notes (Signed)
Procedure Name: MAC Date/Time: 09/13/2019 12:25 PM Performed by: Jenne Campus, CRNA Pre-anesthesia Checklist: Patient identified, Emergency Drugs available, Suction available and Patient being monitored Oxygen Delivery Method: Nasal cannula

## 2019-09-13 NOTE — Progress Notes (Signed)
  Echocardiogram 2D Echocardiogram has been performed.  Jennette Dubin 09/13/2019, 2:04 PM

## 2019-09-13 NOTE — CV Procedure (Signed)
    TRANSESOPHAGEAL ECHOCARDIOGRAM   NAME:  Devonte Migues   MRN: [id number] DOB:  [birth year]   ADMIT DATE: 09/07/2019  INDICATIONS: Atrial fibrillation, mitral regurgitation  PROCEDURE:   Informed consent was obtained prior to the procedure. The risks, benefits and alternatives for the procedure were discussed and the patient comprehended these risks.  Risks include, but are not limited to, cough, sore throat, vomiting, nausea, somnolence, esophageal and stomach trauma or perforation, bleeding, low blood pressure, aspiration, pneumonia, infection, trauma to the teeth and death.    Procedural time out performed. The oropharynx was anesthetized with topical 1% benzocaine.    He received monitored anesthesia care under the supervision of Dr. Ola Spurr. He received 207 mg propofol. He had episodes of hypotension and airway obstruction, but this was able to be managed by the anesthesia team.  The transesophageal probe was inserted in the esophagus and stomach without difficulty and multiple views were obtained.    COMPLICATIONS:    There were no immediate complications.  FINDINGS:  LEFT VENTRICLE: EF moderately reduced.   RIGHT VENTRICLE: Normal size and function.   LEFT ATRIUM: No thrombus/mass.  LEFT ATRIAL APPENDAGE: There is echodensity in the left atrial appendage, separate from artifact from the coumadin ridge. This is best seen on biplane in multiple views. Therefore cardioversion not performed.  RIGHT ATRIUM: No thrombus/mass.  AORTIC VALVE:  Trileaflet. Mild regurgitation. No vegetation.  MITRAL VALVE:    Normal structure. Moderate to severe regurgitation, appears moderate based on PISA measurements but is eccentric. Unable to pulse PV as he was not tolerating procedure well at the time and expedited images were obtained. There appears to be a ruptured chord or partial prolapsed leaflet of A2.  TRICUSPID VALVE: Normal structure. Mild regurgitation. No  vegetation.  PULMONIC VALVE: Grossly normal structure. Mild regurgitation. No apparent vegetation.  INTERATRIAL SEPTUM: No PFO or ASD seen by color Doppler.  PERICARDIUM: No effusion noted.  DESCENDING AORTA: Moderate diffuse plaque seen   CONCLUSION: Moderate to severe MR, eccentric jet, will be fully quantified in final report. There is thrombus of the left atrial appendage, so cardioversion not attempted.  [name], MD, PhD [facility]  [address] [place], [place] [address] [phone]   12:55 PM

## 2019-09-13 NOTE — Progress Notes (Addendum)
Progress Note  Patient Name: Raymond Christensen Date of Encounter: 09/13/2019  [facility] Cardiologist: Sinclair Grooms, MD   Subjective   Feeling well. No chest pain, sob or palpitations.   Inpatient Medications    Scheduled Meds: . amiodarone  200 mg Oral BID  . apixaban  5 mg Oral BID  . aspirin EC  81 mg Oral QHS  . atorvastatin  80 mg Oral Daily  . carvedilol  6.25 mg Oral BID WC  . clopidogrel  75 mg Oral Q breakfast  . colchicine  0.3 mg Oral Daily  . ezetimibe  10 mg Oral Daily  . finasteride  5 mg Oral Daily  . levothyroxine  75 mcg Oral QAC breakfast  . liothyronine  10 mcg Oral Daily  . loratadine  10 mg Oral Daily  . sacubitril-valsartan  1 tablet Oral BID  . sodium chloride flush  3 mL Intravenous Q12H  . spironolactone  12.5 mg Oral Daily  . tamsulosin  0.4 mg Oral QHS   Continuous Infusions: . sodium chloride    . sodium chloride 20 mL/hr at 09/10/19 1130   PRN Meds: sodium chloride, acetaminophen, fluticasone, morphine injection, nitroGLYCERIN, ondansetron (ZOFRAN) IV, sodium chloride flush, traMADol   Vital Signs    Vitals:   09/12/19 1947 09/12/19 2348 09/13/19 0449 09/13/19 0757  BP: 128/67 108/69 117/76 127/74  Pulse: 68 73 67 71  Resp: 18 17 18 18   Temp: 98.2 F (36.8 C) 98.3 F (36.8 C) 97.8 F (36.6 C) 97.6 F (36.4 C)  TempSrc: Oral Oral Oral Oral  SpO2: 100% 100%  98%  Weight:   107.3 kg   Height:        Intake/Output Summary (Last 24 hours) at 09/13/2019 0903 Last data filed at 09/13/2019 0631 Gross per 24 hour  Intake 123 ml  Output --  Net 123 ml   Last 3 Weights 09/13/2019 09/12/2019 09/11/2019  Weight (lbs) 236 lb 8 oz 234 lb 14.4 oz 233 lb 14.4 oz  Weight (kg) 107.276 kg 106.55 kg 106.096 kg      Telemetry    Afib at 70-100s - Personally Reviewed  ECG    No new tracing   Physical Exam   GEN: No acute distress.   Neck: No JVD Cardiac: Irregular, + murmurs, rubs, or gallops.  Respiratory: Clear to  auscultation bilaterally. GI: Soft, nontender, non-distended  MS: No edema; No deformity. Neuro:  Nonfocal  Psych: Normal affect   Labs    High Sensitivity Troponin:   Recent Labs  Lab 09/07/19 2240 09/08/19 0042  TROPONINIHS 314* 304*      Chemistry Recent Labs  Lab 09/07/19 2239 09/07/19 2239 09/08/19 1254 09/08/19 1254 09/09/19 0707 09/09/19 0959 09/09/19 1001 09/09/19 1002 09/10/19 0418  NA 138   < > 140   < > 139   < > 141 140 137  K 3.2*   < > 3.8   < > 3.6   < > 3.6 3.6 3.6  CL 102   < > 103  --  101  --   --   --  104  CO2 27   < > 27  --  27  --   --   --  25  GLUCOSE 148*   < > 133*  --  139*  --   --   --  125*  BUN 24*   < > 16  --  14  --   --   --  11  CREATININE 0.92   < > 0.92  --  1.01  --   --   --  0.92  CALCIUM 8.9   < > 9.3  --  9.1  --   --   --  8.9  PROT 6.9  --   --   --   --   --   --   --   --   ALBUMIN 3.8  --   --   --   --   --   --   --   --   AST 31  --   --   --   --   --   --   --   --   ALT 34  --   --   --   --   --   --   --   --   ALKPHOS 54  --   --   --   --   --   --   --   --   BILITOT <0.1*  --   --   --   --   --   --   --   --   GFRNONAA >60   < > >60  --  >60  --   --   --  >60  GFRAA >60   < > >60  --  >60  --   --   --  >60  ANIONGAP 9   < > 10  --  11  --   --   --  8   < > = values in this interval not displayed.     Hematology Recent Labs  Lab 09/08/19 1254 09/08/19 1254 09/09/19 0707 09/09/19 0959 09/09/19 1001 09/09/19 1002 09/10/19 0418  WBC 9.4  --  10.5  --   --   --  8.8  RBC 4.72  --  4.60  --   --   --  4.50  HGB 13.6   < > 13.3   < > 13.9 13.6 13.2  HCT 42.2   < > 41.3   < > 41.0 40.0 39.7  MCV 89.4  --  89.8  --   --   --  88.2  MCH 28.8  --  28.9  --   --   --  29.3  MCHC 32.2  --  32.2  --   --   --  33.2  RDW 13.6  --  13.7  --   --   --  13.6  PLT 142*  --  134*  --   --   --  129*   < > = values in this interval not displayed.    BNP Recent Labs  Lab 09/08/19 1800  BNP 264.7*      DDimer No results for input(s): DDIMER in the last 168 hours.   Radiology    No results found.  Cardiac Studies   RIGHT/LEFT HEART CATH AND CORONARY/GRAFT ANGIOGRAPHY  Conclusion    Mid LM to Dist LM lesion is 70% stenosed.  1st Mrg lesion is 75% stenosed.  2nd Mrg-1 lesion is 90% stenosed.  2nd Mrg-2 lesion is 95% stenosed.  Mid RCA to Dist RCA lesion is 100% stenosed.  Prox RCA to Mid RCA lesion is 90% stenosed.  2nd Diag lesion is 75% stenosed.  Origin to Prox Graft lesion between 2nd Mrg and RPDA is 99% stenosed.  Prox Graft to Mid Graft lesion between  [place] and 2nd Mrg is 75% stenosed.  A drug-eluting stent was successfully placed using a STENT RESOLUTE ONYX 3.0X18.  Post intervention, there is a 0% residual stenosis.  A drug-eluting stent was successfully placed.  Post intervention, there is a 0% residual stenosis.  Hemodynamic findings consistent with mitral valve regurgitation.   IMPRESSION: Mr. Moure has an occluded RCA with an occluded second diagonal branch vein graft at the aorta and high-grade disease in a sequential vein graft supplying the second obtuse marginal branch and PDA.  His filling pressures are fairly low.  I placed 2 sequential drug-eluting stents with excellent result.  It is unclear whether his MR is structural or functional but I suspect he may require a transesophageal echo to further evaluate.  The sheath will be removed once the ACT falls below 170 and pressure held.  He will be gently hydrated.  He will need to be treated with an oral anticoagulant for his A. fib as well as aspirin and Plavix for 30 days after which the aspirin can be discontinued.  He will need at least 12 months of Plavix and long-term oral anticoagulation.  Diagnostic Dominance: Right  Intervention     Patient Profile     79 y.o.malewith a history of MI in 1994 c/b cardiac arrest s/p lytics and PTCA, CABG in 2001 (LIMA to LAD, SVG to OM-2, SVG to right  PDA, SVG to D-2) with post-op AF, carotid stenosis s/p L CEA and right subclavian stenosis, diet-controlled DM, HTN, obesity, OSA on CPAP, mild cognitive impairment, depression/mood disorder, and hypothyroidism. Admitted with new onsetafib,elevated troponinI and new reducedEF, 35-40%withmoderate to severe MR.  Assessment & Plan    1. Unstable angina with hx of CAD s/p CABG - Cath this admission as above. Severe native multivessel disease with 75% mid and 99% distal SVG to OM/PDA stenosis both managed with separate PCI/DES. - Continue triple therapy for one month and stop ASA after 1 month 10/10/19.  - No chest pain.  - Continue statin and BB.   2. New onset atrial fibrillation  - Rate controlled. Unknown duration.  - For TEE/DCCV today  - Continue Eliquis  3. Acute combined CHF - Echo this admission showed EF 35-40% - felt to be both ischemic in nature, as well as rate related due to new onset atrial fibrillation. - Ramipril changed to Entresto - Continue Core and Spironolactone - Benefit check for SGLT-2 inhibitor  4. Mitral regurgitation  - Pending TEE today  - ? Mitral clip  5. DM - A1c 6.6 - Pendign SGLT-2 inhibitor benefit  6. DM - 09/08/2019: Cholesterol 135; HDL 42; LDL Cholesterol 76; Triglycerides 85; VLDL 17  -Simvastatin transitioned to atorvastatin 80mg  daily and home zetia continued.  - Repeat labs in 6 weeks  7. HTN - BP stable on current medications  For questions or updates, please contact [place] Please consult [email or web address] for contact info under        Signed, Leanor Kail, PA  09/13/2019, 9:03 AM    I have personally seen and examined this patient. I agree with the assessment and plan as outlined above.  79 yo male s/p PCI of two lesions in his SVG to OM last week. Also with atrial fib and moderate to severe MR. Plans for DCCV today, TEE guided. Assess MV valve by TEE. Continue triple therapy for one month with ASA, Plavix and  Eliquis and stop ASA in one month. Possible d/c home today after DCCV.   Lauree Chandler  09/13/2019 9:44 AM

## 2019-09-13 NOTE — Anesthesia Preprocedure Evaluation (Addendum)
Anesthesia Evaluation  Patient identified by MRN, date of birth, ID band Patient awake    Reviewed: Allergy & Precautions, NPO status , Patient's Chart, lab work & pertinent test results, reviewed documented beta blocker date and time   Airway Mallampati: I  TM Distance: >3 FB Neck ROM: Full    Dental  (+) Teeth Intact, Dental Advisory Given   Pulmonary sleep apnea and Continuous Positive Airway Pressure Ventilation , former smoker,    Pulmonary exam normal breath sounds clear to auscultation       Cardiovascular hypertension, Pt. on home beta blockers and Pt. on medications + CAD, + Past MI, + Cardiac Stents (09/09/19), + CABG and + Peripheral Vascular Disease (s/p left carotid artery endarterectomy)  Normal cardiovascular exam Rhythm:Regular Rate:Normal  LHC 09/09/2019 Mid LM to Dist LM lesion is 70% stenosed. 1st Mrg lesion is 75% stenosed. 2nd Mrg-1 lesion is 90% stenosed. 2nd Mrg-2 lesion is 95% stenosed. Mid RCA to Dist RCA lesion is 100% stenosed. Prox RCA to Mid RCA lesion is 90% stenosed. 2nd Diag lesion is 75% stenosed. Origin to Prox Graft lesion between 2nd Mrg and RPDA is 99% stenosed. Prox Graft to Mid Graft lesion between Ramus and 2nd Mrg is 75% stenosed. A drug-eluting stent was successfully placed using a STENT RESOLUTE ONYX 3.0X18. Post intervention, there is a 0% residual stenosis. A drug-eluting stent was successfully placed. Post intervention, there is a 0% residual stenosis. Hemodynamic findings consistent with mitral valve regurgitation  TTE 08/2018 EF 35-40%, mod/severe MR, RV function normal   Neuro/Psych PSYCHIATRIC DISORDERS Depression negative neurological ROS     GI/Hepatic negative GI ROS, Neg liver ROS,   Endo/Other  diabetes, Well ControlledHypothyroidism   Renal/GU negative Renal ROS  negative genitourinary   Musculoskeletal  (+) Arthritis ,   Abdominal   Peds   Hematology negative hematology ROS (+)   Anesthesia Other Findings Presented on 09/07/19 with NSTEMI. LHC revealed RCA occlusion now s/p DESx2 on 09/09/19.   Reproductive/Obstetrics                            Anesthesia Physical Anesthesia Plan  ASA: III  Anesthesia Plan: General   Post-op Pain Management:    Induction: Intravenous  PONV Risk Score and Plan: 2 and Propofol infusion and Treatment may vary due to age or medical condition  Airway Management Planned: Natural Airway  Additional Equipment:   Intra-op Plan:   Post-operative Plan:   Informed Consent: I have reviewed the patients History and Physical, chart, labs and discussed the procedure including the risks, benefits and alternatives for the proposed anesthesia with the patient or authorized representative who has indicated his/her understanding and acceptance.     Dental advisory given  Plan Discussed with: CRNA  Anesthesia Plan Comments:         Anesthesia Quick Evaluation

## 2019-09-13 NOTE — Progress Notes (Signed)
[facility] PROGRESS NOTE    Progress Note  Raymond Christensen  ENI:[id number] DOB: September 13, 1940 DOA: 09/07/2019 PCP: [name], DO     Brief Narrative:   Raymond Christensen is an 79 y.o. male past medical history of essential hypertension, hyperlipidemia diet-controlled diabetes mellitus type 2 CAD and peripheral vascular disease status post left carotid endarterectomy morbid obesity obstructive sleep apnea comes in with epigastric abdominal pain that started 1 week prior to admission.  He tachycardic and tachypneic with a low potassium troponin I 300 and EKG A. fib was started on IV heparin  Assessment/Plan:   NSTEMI (non-ST elevated myocardial infarction) ([place]); 2D echo showed an EF of 35% with severe hypokinesia of the posterior wall. Admitted to telemetry, on IV heparin, aspirin, statins and nitroglycerin. Cardiology was consulted recommended diagnostic cardiac cath on 09/09/2019 with 2 drug-eluting stents  He is currently symptom-free we will continue aspirin and Plavix for 30 days along with Eliquis, drop aspirin in 30 days and then continue Plavix and Eliquis, continue statins.  Paroxysmal atrial fibrillation: Patient has a prior history of postoperative atrial fibrillation following CABG with no recurrent episodes since then.   Chads vas score of at least 5, now on Eliquis. Cardiology recommended a TEE for further evaluation as A. fib may be contributing to his decreased EF.  For DC cardioversion on 09/13/2019.  Acute combined systolic and diastolic heart failure: TEE will be needed for further evaluation of new finding of potential mitral valve dysfunction.  There is a concern for new MR leading to systolic dysfunction. Continue Entresto, Coreg and Aldactone. Likely benefit from SGLT2 inhibitor as an outpatient.  The patient will like to continue his diet.  CAD: Continue statins.  Essential hypertension: Holding antihypertensive medication, blood pressure seems to  be stable.  Diabetes mellitus type 2: With an A1c of 6.6 continue carb modified diet after cardiac cath.  Hypothyroidism: Continue Synthroid.  Chronic thrombocytopenia: No signs of bleeding. See to chroni compare to 2019.  Upper abdominal discomfort: Abdominal pain seems to be resolved.  Obstructive sleep apnea: Continue CPAP.  Right great toe pain: X-ray showed no acute fractures dislocation continue statins, aspirin and Plavix. Currently on colchicine for possible gout.    DVT prophylaxis: Eliquis Family Communication:none Status is: Inpatient  Remains inpatient appropriate because:Hemodynamically unstable   Dispo: The patient is from: Home              Anticipated d/c is to: Home              Anticipated d/c date is: 2 days              Patient currently is not medically stable to d/c.  For TEE with cardioversion on 09/13/2019. Code Status:     Code Status Orders  (From admission, onward)         Start     Ordered   09/08/19 1227  Full code  Continuous        09/08/19 1229        Code Status History    This patient has a current code status but no historical code status.   Advance Care Planning Activity       IV Access:    Peripheral IV   Procedures and diagnostic studies:   No results found.   Medical Consultants:    None.  Anti-Infectives:   none  Subjective:    Raymond Christensen foot pain continues to improve, he relates his breathing is better.  Objective:    Vitals:   09/12/19 1947 09/12/19 2348 09/13/19 0449 09/13/19 0757  BP: 128/67 108/69 117/76 127/74  Pulse: 68 73 67 71  Resp: 18 17 18 18   Temp: 98.2 F (36.8 C) 98.3 F (36.8 C) 97.8 F (36.6 C) 97.6 F (36.4 C)  TempSrc: Oral Oral Oral Oral  SpO2: 100% 100%  98%  Weight:   107.3 kg   Height:       SpO2: 98 % O2 Flow Rate (L/min): 2 L/min   Intake/Output Summary (Last 24 hours) at 09/13/2019 0930 Last data filed at 09/13/2019 0631 Gross per 24 hour  Intake  123 ml  Output --  Net 123 ml   Filed Weights   09/11/19 0456 09/12/19 0523 09/13/19 0449  Weight: 106.1 kg 106.5 kg 107.3 kg    Exam: General exam: In no acute distress. Respiratory system: Good air movement and clear to auscultation. Cardiovascular system: S1 & S2 heard, RRR. No JVD. Gastrointestinal system: Abdomen is nondistended, soft and nontender.  Extremities: No pedal edema. Skin: No rashes, lesions or ulcers  Data Reviewed:    Labs: Basic Metabolic Panel: Recent Labs  Lab 09/07/19 2239 09/07/19 2239 09/08/19 1254 09/08/19 1254 09/09/19 0707 09/09/19 0707 09/09/19 0959 09/09/19 0959 09/09/19 1001 09/09/19 1001 09/09/19 1002 09/10/19 0418 09/11/19 0223  NA 138   < > 140   < > 139  --  140  --  141  --  140 137  --   K 3.2*   < > 3.8   < > 3.6   < > 3.8   < > 3.6   < > 3.6 3.6  --   CL 102  --  103  --  101  --   --   --   --   --   --  104  --   CO2 27  --  27  --  27  --   --   --   --   --   --  25  --   GLUCOSE 148*  --  133*  --  139*  --   --   --   --   --   --  125*  --   BUN 24*  --  16  --  14  --   --   --   --   --   --  11  --   CREATININE 0.92  --  0.92  --  1.01  --   --   --   --   --   --  0.92  --   CALCIUM 8.9  --  9.3  --  9.1  --   --   --   --   --   --  8.9  --   MG  --   --   --   --  1.8  --   --   --   --   --   --   --  1.8   < > = values in this interval not displayed.   GFR Estimated Creatinine Clearance: 79.9 mL/min (by C-G formula based on SCr of 0.92 mg/dL). Liver Function Tests: Recent Labs  Lab 09/07/19 2239  AST 31  ALT 34  ALKPHOS 54  BILITOT <0.1*  PROT 6.9  ALBUMIN 3.8   Recent Labs  Lab 09/07/19 1621 09/07/19 2239  LIPASE 12 26   No results for input(s): AMMONIA in the  last 168 hours. Coagulation profile No results for input(s): INR, PROTIME in the last 168 hours. COVID-19 Labs  No results for input(s): DDIMER, FERRITIN, LDH, CRP in the last 72 hours.  Lab Results  Component Value Date    [place] NEGATIVE 09/08/2019    CBC: Recent Labs  Lab 09/07/19 1621 09/07/19 1621 09/07/19 2239 09/07/19 2239 09/08/19 1254 09/08/19 1254 09/09/19 0707 09/09/19 0959 09/09/19 1001 09/09/19 1002 09/10/19 0418  WBC 9.0  --  9.0  --  9.4  --  10.5  --   --   --  8.8  NEUTROABS 5,715  --  5.5  --   --   --   --   --   --   --   --   HGB 13.5   < > 13.5   < > 13.6   < > 13.3 13.6 13.9 13.6 13.2  HCT 40.6   < > 41.2   < > 42.2   < > 41.3 40.0 41.0 40.0 39.7  MCV 87.9  --  89.2  --  89.4  --  89.8  --   --   --  88.2  PLT 150  --  143*  --  142*  --  134*  --   --   --  129*   < > = values in this interval not displayed.   Cardiac Enzymes: Recent Labs  Lab 09/07/19 1621  TROPONINI 514*   BNP (last 3 results) No results for input(s): PROBNP in the last 8760 hours. CBG: Recent Labs  Lab 09/08/19 1239 09/09/19 1157  GLUCAP 134* 123*   D-Dimer: No results for input(s): DDIMER in the last 72 hours. Hgb A1c: No results for input(s): HGBA1C in the last 72 hours. Lipid Profile: No results for input(s): CHOL, HDL, LDLCALC, TRIG, CHOLHDL, LDLDIRECT in the last 72 hours. Thyroid function studies: No results for input(s): TSH, T4TOTAL, T3FREE, THYROIDAB in the last 72 hours.  Invalid input(s): FREET3 Anemia work up: No results for input(s): VITAMINB12, FOLATE, FERRITIN, TIBC, IRON, RETICCTPCT in the last 72 hours. Sepsis Labs: Recent Labs  Lab 09/07/19 2239 09/08/19 1254 09/09/19 0707 09/10/19 0418  WBC 9.0 9.4 10.5 8.8   Microbiology Recent Results (from the past 240 hour(s))  SARS Coronavirus 2 by RT PCR (hospital order, performed in [facility] hospital lab) Nasopharyngeal Nasopharyngeal Swab     Status: None   Collection Time: 09/08/19  3:20 AM   Specimen: Nasopharyngeal Swab  Result Value Ref Range Status   SARS Coronavirus 2 NEGATIVE NEGATIVE Final    Comment: (NOTE) SARS-CoV-2 target nucleic acids are NOT DETECTED.  The SARS-CoV-2 RNA is generally  detectable in upper and lower respiratory specimens during the acute phase of infection. The lowest concentration of SARS-CoV-2 viral copies this assay can detect is 250 copies / mL. A negative result does not preclude SARS-CoV-2 infection and should not be used as the sole basis for treatment or other patient management decisions.  A negative result may occur with improper specimen collection / handling, submission of specimen other than nasopharyngeal swab, presence of viral mutation(s) within the areas targeted by this assay, and inadequate number of viral copies (<250 copies / mL). A negative result must be combined with clinical observations, patient history, and epidemiological information.  Fact Sheet for Patients:   StrictlyIdeas.no  Fact Sheet for Healthcare Providers: BankingDealers.co.za  This test is not yet approved or  cleared by the [place] FDA and has been authorized for  detection and/or diagnosis of SARS-CoV-2 by FDA under an Emergency Use Authorization (EUA).  This EUA will remain in effect (meaning this test can be used) for the duration of the COVID-19 declaration under Section 564(b)(1) of the Act, 21 U.S.C. section 360bbb-3(b)(1), unless the authorization is terminated or revoked sooner.  Performed at [facility], [address] [place], [place] [address]      Medications:   . amiodarone  200 mg Oral BID  . apixaban  5 mg Oral BID  . aspirin EC  81 mg Oral QHS  . atorvastatin  80 mg Oral Daily  . carvedilol  6.25 mg Oral BID WC  . clopidogrel  75 mg Oral Q breakfast  . colchicine  0.3 mg Oral Daily  . ezetimibe  10 mg Oral Daily  . finasteride  5 mg Oral Daily  . levothyroxine  75 mcg Oral QAC breakfast  . liothyronine  10 mcg Oral Daily  . loratadine  10 mg Oral Daily  . sacubitril-valsartan  1 tablet Oral BID  . sodium chloride flush  3 mL Intravenous Q12H  . spironolactone  12.5  mg Oral Daily  . tamsulosin  0.4 mg Oral QHS   Continuous Infusions: . sodium chloride    . sodium chloride 20 mL/hr at 09/10/19 1130      LOS: 5 days   Charlynne Cousins  [facility]  09/13/2019, 9:30 AM

## 2019-09-13 NOTE — Progress Notes (Signed)
CARDIAC REHAB PHASE I   PRE:  Rate/Rhythm: 87 afib    BP: sitting 109/66    SaO2:   MODE:  Ambulation: 1500 ft   POST:  Rate/Rhythm: 131 afib    BP: sitting 124/84     SaO2: 98 RA  Pt able to ambulate without significant SOB. HR up to 131. Discussed/reviewed education with pt and wife. Gave exercise guidelines and discussed CRPII, which he is eager to do. Will refer to [place]. Pt has his education materials at home. Answered questions regarding diet. Showed pt how to feel for afib at home. Pt and wife would like him to stay the night tonight and d/c tomorrow. Encouraged ambulation later tonight.   [place], ACSM 09/13/2019 10:49 AM

## 2019-09-13 NOTE — Interval H&P Note (Signed)
History and Physical Interval Note:  09/13/2019 11:28 AM  Raymond Christensen  has presented today for surgery, with the diagnosis of AFIB.  The various methods of treatment have been discussed with the patient and family. After consideration of risks, benefits and other options for treatment, the patient has consented to  Procedure(s): TRANSESOPHAGEAL ECHOCARDIOGRAM (TEE) (N/A) CARDIOVERSION (N/A) as a surgical intervention.  The patient's history has been reviewed, patient examined, no change in status, stable for surgery.  I have reviewed the patient's chart and labs.  Questions were answered to the patient's satisfaction.     Porter Nakama Harrell Gave

## 2019-09-13 NOTE — Transfer of Care (Signed)
Immediate Anesthesia Transfer of Care Note  Patient: Raymond Christensen  Procedure(s) Performed: TRANSESOPHAGEAL ECHOCARDIOGRAM (TEE) (N/A )  Patient Location: Endoscopy Unit  Anesthesia Type:MAC  Level of Consciousness: awake, oriented and patient cooperative  Airway & Oxygen Therapy: Patient Spontanous Breathing and Patient connected to nasal cannula oxygen  Post-op Assessment: Report given to RN and Post -op Vital signs reviewed and stable  Post vital signs: Reviewed  Last Vitals:  Vitals Value Taken Time  BP 109/64 09/13/19 1310  Temp    Pulse 85 09/13/19 1311  Resp 22 09/13/19 1311  SpO2 99 % 09/13/19 1311  Vitals shown include unvalidated device data.  Last Pain:  Vitals:   09/13/19 1108  TempSrc: Oral  PainSc: 0-No pain      Patients Stated Pain Goal: 3 (24/58/09 9833)  Complications: No complications documented.

## 2019-09-14 ENCOUNTER — Ambulatory Visit: Payer: Medicare (Managed Care) | Admitting: Family Medicine

## 2019-09-14 ENCOUNTER — Telehealth: Payer: Self-pay | Admitting: Cardiology

## 2019-09-14 ENCOUNTER — Encounter (HOSPITAL_COMMUNITY): Payer: Self-pay | Admitting: Cardiology

## 2019-09-14 LAB — BASIC METABOLIC PANEL
Anion gap: 11 (ref 5–15)
BUN: 17 mg/dL (ref 8–23)
CO2: 20 mmol/L — ABNORMAL LOW (ref 22–32)
Calcium: 8.5 mg/dL — ABNORMAL LOW (ref 8.9–10.3)
Chloride: 107 mmol/L (ref 98–111)
Creatinine, Ser: 1.1 mg/dL (ref 0.61–1.24)
GFR calc Af Amer: >60 mL/min (ref >60)
GFR calc non Af Amer: >60 mL/min (ref >60)
Glucose, Bld: 122 mg/dL — ABNORMAL HIGH (ref 70–99)
Potassium: 3.8 mmol/L (ref 3.5–5.1)
Sodium: 138 mmol/L (ref 135–145)

## 2019-09-14 MED ORDER — ASPIRIN 81 MG PO TBEC
81.0000 mg | DELAYED_RELEASE_TABLET | Freq: Every day | ORAL | 0 refills | Status: DC
Start: 2019-09-14 — End: 2019-11-17

## 2019-09-14 MED ORDER — CLOPIDOGREL BISULFATE 75 MG PO TABS
75.0000 mg | ORAL_TABLET | Freq: Every day | ORAL | 3 refills | Status: DC
Start: 2019-09-15 — End: 2019-09-14

## 2019-09-14 MED ORDER — APIXABAN 5 MG PO TABS
5.0000 mg | ORAL_TABLET | Freq: Two times a day (BID) | ORAL | 3 refills | Status: DC
Start: 2019-09-14 — End: 2019-09-14

## 2019-09-14 MED ORDER — SACUBITRIL-VALSARTAN 24-26 MG PO TABS: 1.0000 | ORAL_TABLET | Freq: Two times a day (BID) | ORAL | 3 refills | Status: DC

## 2019-09-14 MED ORDER — COLCHICINE 0.6 MG PO TABS
0.3000 mg | ORAL_TABLET | Freq: Every day | ORAL | 0 refills | Status: DC
Start: 2019-09-15 — End: 2019-09-30

## 2019-09-14 MED ORDER — ATORVASTATIN CALCIUM 80 MG PO TABS
80.0000 mg | ORAL_TABLET | Freq: Every day | ORAL | 3 refills | Status: DC
Start: 2019-09-15 — End: 2019-10-13

## 2019-09-14 MED ORDER — AMIODARONE HCL 200 MG PO TABS
200.0000 mg | ORAL_TABLET | Freq: Two times a day (BID) | ORAL | 0 refills | Status: DC
Start: 2019-09-14 — End: 2019-09-15

## 2019-09-14 MED ORDER — NITROGLYCERIN 0.4 MG SL SUBL: 0.4000 mg | SUBLINGUAL_TABLET | SUBLINGUAL | 12 refills | Status: DC | PRN

## 2019-09-14 MED ORDER — CARVEDILOL 6.25 MG PO TABS
6.2500 mg | ORAL_TABLET | Freq: Two times a day (BID) | ORAL | 3 refills | Status: DC
Start: 2019-09-14 — End: 2019-09-30

## 2019-09-14 MED ORDER — COLCHICINE 0.6 MG PO TABS
0.3000 mg | ORAL_TABLET | Freq: Every day | ORAL | 0 refills | Status: DC
Start: 2019-09-15 — End: 2019-09-14

## 2019-09-14 MED ORDER — CARVEDILOL 6.25 MG PO TABS
6.2500 mg | ORAL_TABLET | Freq: Two times a day (BID) | ORAL | 3 refills | Status: DC
Start: 2019-09-14 — End: 2019-09-14

## 2019-09-14 MED ORDER — ASPIRIN 81 MG PO TBEC: 81.0000 mg | DELAYED_RELEASE_TABLET | Freq: Every day | ORAL | 0 refills | Status: DC

## 2019-09-14 MED ORDER — FUROSEMIDE 40 MG PO TABS: 40.0000 mg | ORAL_TABLET | Freq: Every day | ORAL | 3 refills | Status: DC

## 2019-09-14 MED ORDER — ATORVASTATIN CALCIUM 80 MG PO TABS
80.0000 mg | ORAL_TABLET | Freq: Every day | ORAL | 3 refills | Status: DC
Start: 2019-09-15 — End: 2019-09-14

## 2019-09-14 MED ORDER — CLOPIDOGREL BISULFATE 75 MG PO TABS
75.0000 mg | ORAL_TABLET | Freq: Every day | ORAL | 3 refills | Status: DC
Start: 2019-09-15 — End: 2019-09-30

## 2019-09-14 MED ORDER — EZETIMIBE 10 MG PO TABS
10.0000 mg | ORAL_TABLET | Freq: Every day | ORAL | 1 refills | Status: DC
Start: 2019-09-14 — End: 2019-09-30

## 2019-09-14 MED ORDER — AMIODARONE HCL 200 MG PO TABS: 200.0000 mg | ORAL_TABLET | Freq: Two times a day (BID) | ORAL | 0 refills | Status: DC

## 2019-09-14 MED ORDER — APIXABAN 5 MG PO TABS
5.0000 mg | ORAL_TABLET | Freq: Two times a day (BID) | ORAL | 3 refills | Status: DC
Start: 2019-09-14 — End: 2019-09-30

## 2019-09-14 MED ORDER — SPIRONOLACTONE 25 MG PO TABS
12.5000 mg | ORAL_TABLET | Freq: Every day | ORAL | 3 refills | Status: DC
Start: 2019-09-15 — End: 2019-09-30

## 2019-09-14 MED ORDER — SPIRONOLACTONE 25 MG PO TABS
12.5000 mg | ORAL_TABLET | Freq: Every day | ORAL | 3 refills | Status: DC
Start: 2019-09-15 — End: 2019-09-14

## 2019-09-14 MED FILL — COLCHICINE 0.6 MG TABS: 0.6 | 4 days supply | Qty: 4 | Fill #0

## 2019-09-14 MED FILL — NITROGLYCERIN 0.4 MG TAB SL: 0.4 | 7 days supply | Qty: 25 | Fill #0

## 2019-09-14 MED FILL — ELIQUIS 5 MG TABLET: 5 | 30 days supply | Qty: 60 | Fill #0

## 2019-09-14 MED FILL — CLOPIDOGREL 75 MG TABLET: 75 | 30 days supply | Qty: 30 | Fill #0

## 2019-09-14 MED FILL — ATORVASTATIN CALCIUM 80 MG: 80 | 30 days supply | Qty: 30 | Fill #0

## 2019-09-14 MED FILL — EZETIMIBE 10 MG TABS: 10 | 30 days supply | Qty: 30 | Fill #0

## 2019-09-14 MED FILL — FUROSEMIDE 40 MG TABLET: 40 | 30 days supply | Qty: 30 | Fill #0

## 2019-09-14 MED FILL — CARVEDILOL 6.25 MG TABLET: 6.25 | 30 days supply | Qty: 60 | Fill #0

## 2019-09-14 MED FILL — ENTRESTO 24 MG-26 MG TABLET: 24-26 | 30 days supply | Qty: 60 | Fill #0

## 2019-09-14 MED FILL — AMIODARONE HCL 200 MG TABS: 200 | 10 days supply | Qty: 20 | Fill #0

## 2019-09-14 MED FILL — SPIRONOLACTONE 25 MG TABLET: 25 | 60 days supply | Qty: 30 | Fill #0

## 2019-09-14 MED FILL — ASPIRIN LOW DOSE 81 MG TBEC: 81 | 30 days supply | Qty: 30 | Fill #0

## 2019-09-14 NOTE — Care Management (Addendum)
1245 09-14-19 Patient to transition home on Entresto and Eliquis. The co pay for Eliquis was $5.00, so Entresto should be around the same amount. Patient will benefit from his medications being filled at the [facility] to make sure he receives the [place] before transitioning into the community. Bethena Roys, RN,BSN Case Manager

## 2019-09-14 NOTE — Progress Notes (Signed)
[facility] PROGRESS NOTE    Progress Note  Ashrith Sagan  EZM:[id number] DOB: 09/09/1940 DOA: 09/07/2019 PCP: [name], DO     Brief Narrative:   Darrik Richman is an 79 y.o. male past medical history of essential hypertension, hyperlipidemia diet-controlled diabetes mellitus type 2 CAD and peripheral vascular disease status post left carotid endarterectomy morbid obesity obstructive sleep apnea comes in with epigastric abdominal pain that started 1 week prior to admission.  He tachycardic and tachypneic with a low potassium troponin I 300 and EKG A. fib was started on IV heparin  Assessment/Plan:   NSTEMI (non-ST elevated myocardial infarction) ([place]); 2D echo showed an EF of 35% with severe hypokinesia of the posterior wall. Admitted to telemetry, on IV heparin, aspirin, statins and nitroglycerin. Cardiology was consulted recommended diagnostic cardiac cath on 09/09/2019 with 2 drug-eluting stents  He is currently symptom-free, will continue aspirin and Plavix for 30 days along with Eliquis, drop aspirin in 30 days and then continue Plavix and Eliquis, continue statins.  Paroxysmal atrial fibrillation: Chads vas score of at least 5, now on Eliquis. Cardiology recommended a TEE showed LA thrombus. Will need to follow up with cards as an outpatient. TEE also showed Severe MR with probable rupture chord and partial prolapse of A2, awaiting cards recommendations.  Severe MR: TEE showed possible rupture chord and partial prolapse leaflet of A2.  Acute combined systolic and diastolic heart failure: TEE will be needed for further evaluation of new finding of potential mitral valve dysfunction.  There is a concern for new MR leading to systolic dysfunction. Continue Entresto, Coreg and Aldactone. Pending benefit from SGLT2 inhibitor as an outpatient.  The patient will like to continue his diet.  CAD: Continue statins.  Essential hypertension: Holding  antihypertensive medication, blood pressure seems to be stable.  Diabetes mellitus type 2: With an A1c of 6.6 continue carb modified diet after cardiac cath.  Hypothyroidism: Continue Synthroid.  Chronic thrombocytopenia: No signs of bleeding. See to chroni compare to 2019.  Upper abdominal discomfort: Abdominal pain seems to be resolved.  Obstructive sleep apnea: Continue CPAP.  Right great toe pain: X-ray showed no acute fractures dislocation continue statins, aspirin and Plavix. Currently on colchicine for possible gout.    DVT prophylaxis: Eliquis Family Communication:none Status is: Inpatient  Remains inpatient appropriate because:Hemodynamically unstable   Dispo: The patient is from: Home              Anticipated d/c is to: Home              Anticipated d/c date is: 2 days              Patient currently is not medically stable to d/c.   Code Status:     Code Status Orders  (From admission, onward)         Start     Ordered   09/08/19 1227  Full code  Continuous        09/08/19 1229        Code Status History    This patient has a current code status but no historical code status.   Advance Care Planning Activity       IV Access:    Peripheral IV   Procedures and diagnostic studies:   ECHO TEE  Result Date: 09/13/2019    TRANSESOPHOGEAL ECHO REPORT   Patient Name:   HAMILTON MARINELLO Date of Exam: 09/13/2019 Medical Rec #:  [id number]      Height:  68.0 in Accession #:    [id number]     Weight:       236.5 lb Date of Birth:  1940/03/27      BSA:          2.195 m Patient Age:    39 years       BP:           100/60 mmHg Patient Gender: M              HR:           69 bpm. Exam Location:  Inpatient Procedure: Transesophageal Echo, Cardiac Doppler, Color Doppler and 3D Echo Indications:     Atrial Fibrillation; Mitral Regurgitation  History:         Patient has prior history of Echocardiogram examinations, most                  recent 09/08/2019. CAD;  Risk Factors:Diabetes and Dyslipidemia.  Sonographer:     Mikki Santee RDCS (AE) Referring Phys:  [id number] Leanor Kail Diagnosing Phys: [name] MD PROCEDURE: After discussion of the risks and benefits of a TEE, an informed consent was obtained from the patient. The transesophogeal probe was passed without difficulty through the esophogus of the patient. Local oropharyngeal anesthetic was provided with Cetacaine. Sedation performed by different physician. The patient was monitored while under deep sedation. Anesthestetic sedation was provided intravenously by Anesthesiology: 206.66mg  of Propofol. The patient developed no complications during the procedure. He had episodes of hypotension and airway obstruction, but this was able to be managed by the anesthesia team. IMPRESSIONS  1. Left ventricular ejection fraction, by estimation, is 40 to 45%. The left ventricle has mildly decreased function. Left ventricular endocardial border not optimally defined to evaluate regional wall motion.  2. Right ventricular systolic function is normal. The right ventricular size is normal.  3. A left atrial/left atrial appendage thrombus was detected.  4. Moderate to severe regurgitation; severe by ERO and MR radius, moderate by Rvol. Unable to pulse PV as he was not tolerating procedure well at the time and expedited images were obtained. There appears to be a ruptured chord and partial prolapsed leaflet of A2. Taken together, highly suggestive of severe eccentric MR.. The mitral valve is degenerative. Moderate to severe mitral valve regurgitation.  5. The aortic valve is tricuspid. Aortic valve regurgitation is mild. No aortic stenosis is present.  6. There is Moderate (Grade III) plaque involving the descending aorta. Conclusion(s)/Recommendation(s): Likely severe MR given measurements and eccentric jet. There is thrombus of the left atrial appendage, so cardioversion not attempted. FINDINGS  Left Ventricle:  Left ventricular ejection fraction, by estimation, is 40 to 45%. The left ventricle has mildly decreased function. Left ventricular endocardial border not optimally defined to evaluate regional wall motion. The left ventricular internal cavity size was normal in size. There is no left ventricular hypertrophy. Right Ventricle: The right ventricular size is normal. No increase in right ventricular wall thickness. Right ventricular systolic function is normal. Left Atrium: There is echodensity in the left atrial appendage, separate from artifact from the coumadin ridge. This is best seen on biplane in multiple views. Therefore cardioversion not performed. Left atrial size was not assessed. A left atrial/left atrial appendage thrombus was detected. Right Atrium: Right atrial size was not assessed. Pericardium: There is no evidence of pericardial effusion. Mitral Valve: Moderate to severe regurgitation; severe by ERO and MR radius, moderate by Rvol. Unable to pulse PV as he was not  tolerating procedure well at the time and expedited images were obtained. There appears to be a ruptured chord and partial prolapsed leaflet of A2. Taken together, highly suggestive of severe eccentric MR. The mitral valve is degenerative in appearance. Moderate to severe mitral valve regurgitation. There is no evidence of mitral valve vegetation. Tricuspid Valve: The tricuspid valve is normal in structure. Tricuspid valve regurgitation is mild . No evidence of tricuspid stenosis. There is no evidence of tricuspid valve vegetation. Aortic Valve: The aortic valve is tricuspid. Aortic valve regurgitation is mild. No aortic stenosis is present. There is no evidence of aortic valve vegetation. Pulmonic Valve: The pulmonic valve was grossly normal. Pulmonic valve regurgitation is mild. There is no evidence of pulmonic valve vegetation. Aorta: The aortic root is normal in size and structure. There is moderate (Grade III) plaque involving the  descending aorta. IAS/Shunts: No atrial level shunt detected by color flow Doppler.  MR Peak grad:    91.4 mmHg   TRICUSPID VALVE MR Mean grad:    60.5 mmHg   TR Peak grad:   39.4 mmHg MR Vmax:         478.00 cm/s TR Vmax:        314.00 cm/s MR Vmean:        371.5 cm/s MR PISA:         6.28 cm MR PISA Eff ROA: 40 mm MR PISA Radius:  1.00 cm [name] MD Electronically signed by [name] MD Signature Date/Time: 09/13/2019/8:54:48 PM    Final      Medical Consultants:    None.  Anti-Infectives:   none  Subjective:    Nathon Stefanski he is currently symptom-free.  Objective:    Vitals:   09/14/19 0029 09/14/19 0136 09/14/19 0338 09/14/19 0900  BP: 94/64  107/60 117/72  Pulse: 63  98   Resp:   19   Temp:  97.6 F (36.4 C) 98.4 F (36.9 C)   TempSrc:  Oral Oral   SpO2:  99% 99%   Weight:   106.8 kg   Height:       SpO2: 99 % O2 Flow Rate (L/min): 2 L/min   Intake/Output Summary (Last 24 hours) at 09/14/2019 0955 Last data filed at 09/13/2019 1256 Gross per 24 hour  Intake 200 ml  Output --  Net 200 ml   Filed Weights   09/12/19 0523 09/13/19 0449 09/14/19 0338  Weight: 106.5 kg 107.3 kg 106.8 kg    Exam: General exam: In no acute distress. Respiratory system: Good air movement and clear to auscultation. Cardiovascular system: S1 & S2 heard, RRR. No JVD. Gastrointestinal system: Abdomen is nondistended, soft and nontender.  Extremities: No pedal edema. Skin: No rashes, lesions or ulcers  Data Reviewed:    Labs: Basic Metabolic Panel: Recent Labs  Lab 09/07/19 2239 09/07/19 2239 09/08/19 1254 09/08/19 1254 09/09/19 0707 09/09/19 0707 09/09/19 0959 09/09/19 0959 09/09/19 1001 09/09/19 1001 09/09/19 1002 09/09/19 1002 09/10/19 0418 09/11/19 0223 09/14/19 0438  NA 138   < > 140   < > 139   < > 140  --  141  --  140  --  137  --  138  K 3.2*   < > 3.8   < > 3.6   < > 3.8   < > 3.6   < > 3.6   < > 3.6  --  3.8  CL 102  --   103  --  101  --   --   --   --   --   --   --  104  --  107  CO2 27  --  27  --  27  --   --   --   --   --   --   --  25  --  20*  GLUCOSE 148*  --  133*  --  139*  --   --   --   --   --   --   --  125*  --  122*  BUN 24*  --  16  --  14  --   --   --   --   --   --   --  11  --  17  CREATININE 0.92  --  0.92  --  1.01  --   --   --   --   --   --   --  0.92  --  1.10  CALCIUM 8.9  --  9.3  --  9.1  --   --   --   --   --   --   --  8.9  --  8.5*  MG  --   --   --   --  1.8  --   --   --   --   --   --   --   --  1.8  --    < > = values in this interval not displayed.   GFR Estimated Creatinine Clearance: 66.7 mL/min (by C-G formula based on SCr of 1.1 mg/dL). Liver Function Tests: Recent Labs  Lab 09/07/19 2239  AST 31  ALT 34  ALKPHOS 54  BILITOT <0.1*  PROT 6.9  ALBUMIN 3.8   Recent Labs  Lab 09/07/19 1621 09/07/19 2239  LIPASE 12 26   No results for input(s): AMMONIA in the last 168 hours. Coagulation profile No results for input(s): INR, PROTIME in the last 168 hours. COVID-19 Labs  No results for input(s): DDIMER, FERRITIN, LDH, CRP in the last 72 hours.  Lab Results  Component Value Date   [place] NEGATIVE 09/08/2019    CBC: Recent Labs  Lab 09/07/19 1621 09/07/19 1621 09/07/19 2239 09/07/19 2239 09/08/19 1254 09/08/19 1254 09/09/19 0707 09/09/19 0959 09/09/19 1001 09/09/19 1002 09/10/19 0418  WBC 9.0  --  9.0  --  9.4  --  10.5  --   --   --  8.8  NEUTROABS 5,715  --  5.5  --   --   --   --   --   --   --   --   HGB 13.5   < > 13.5   < > 13.6   < > 13.3 13.6 13.9 13.6 13.2  HCT 40.6   < > 41.2   < > 42.2   < > 41.3 40.0 41.0 40.0 39.7  MCV 87.9  --  89.2  --  89.4  --  89.8  --   --   --  88.2  PLT 150  --  143*  --  142*  --  134*  --   --   --  129*   < > = values in this interval not displayed.   Cardiac Enzymes: Recent Labs  Lab 09/07/19 1621  TROPONINI 514*   BNP (last 3 results) No results for input(s): PROBNP in the last 8760  hours. CBG: Recent Labs  Lab 09/08/19 1239 09/09/19 1157  GLUCAP 134* 123*   D-Dimer: No results for input(s): DDIMER in the last  72 hours. Hgb A1c: No results for input(s): HGBA1C in the last 72 hours. Lipid Profile: No results for input(s): CHOL, HDL, LDLCALC, TRIG, CHOLHDL, LDLDIRECT in the last 72 hours. Thyroid function studies: No results for input(s): TSH, T4TOTAL, T3FREE, THYROIDAB in the last 72 hours.  Invalid input(s): FREET3 Anemia work up: No results for input(s): VITAMINB12, FOLATE, FERRITIN, TIBC, IRON, RETICCTPCT in the last 72 hours. Sepsis Labs: Recent Labs  Lab 09/07/19 2239 09/08/19 1254 09/09/19 0707 09/10/19 0418  WBC 9.0 9.4 10.5 8.8   Microbiology Recent Results (from the past 240 hour(s))  SARS Coronavirus 2 by RT PCR (hospital order, performed in [facility] hospital lab) Nasopharyngeal Nasopharyngeal Swab     Status: None   Collection Time: 09/08/19  3:20 AM   Specimen: Nasopharyngeal Swab  Result Value Ref Range Status   SARS Coronavirus 2 NEGATIVE NEGATIVE Final    Comment: (NOTE) SARS-CoV-2 target nucleic acids are NOT DETECTED.  The SARS-CoV-2 RNA is generally detectable in upper and lower respiratory specimens during the acute phase of infection. The lowest concentration of SARS-CoV-2 viral copies this assay can detect is 250 copies / mL. A negative result does not preclude SARS-CoV-2 infection and should not be used as the sole basis for treatment or other patient management decisions.  A negative result may occur with improper specimen collection / handling, submission of specimen other than nasopharyngeal swab, presence of viral mutation(s) within the areas targeted by this assay, and inadequate number of viral copies (<250 copies / mL). A negative result must be combined with clinical observations, patient history, and epidemiological information.  Fact Sheet for Patients:   StrictlyIdeas.no  Fact Sheet  for Healthcare Providers: BankingDealers.co.za  This test is not yet approved or  cleared by the [place] FDA and has been authorized for detection and/or diagnosis of SARS-CoV-2 by FDA under an Emergency Use Authorization (EUA).  This EUA will remain in effect (meaning this test can be used) for the duration of the COVID-19 declaration under Section 564(b)(1) of the Act, 21 U.S.C. section 360bbb-3(b)(1), unless the authorization is terminated or revoked sooner.  Performed at [facility], [address] [place], [place] [address]      Medications:   . amiodarone  200 mg Oral BID  . apixaban  5 mg Oral BID  . aspirin EC  81 mg Oral QHS  . atorvastatin  80 mg Oral Daily  . carvedilol  6.25 mg Oral BID WC  . clopidogrel  75 mg Oral Q breakfast  . colchicine  0.3 mg Oral Daily  . ezetimibe  10 mg Oral Daily  . finasteride  5 mg Oral Daily  . levothyroxine  75 mcg Oral QAC breakfast  . liothyronine  10 mcg Oral Daily  . loratadine  10 mg Oral Daily  . sacubitril-valsartan  1 tablet Oral BID  . sodium chloride flush  3 mL Intravenous Q12H  . spironolactone  12.5 mg Oral Daily  . tamsulosin  0.4 mg Oral QHS   Continuous Infusions: . sodium chloride        LOS: 6 days   Charlynne Cousins  [facility]  09/14/2019, 9:55 AM

## 2019-09-14 NOTE — Plan of Care (Signed)

## 2019-09-14 NOTE — Progress Notes (Addendum)
Progress Note  Patient Name: Raymond Christensen Date of Encounter: 09/14/2019  [facility] Cardiologist: Sinclair Grooms, MD   Subjective   Feeling well. No chest pain, sob or palpitations.   Inpatient Medications    Scheduled Meds: . amiodarone  200 mg Oral BID  . apixaban  5 mg Oral BID  . aspirin EC  81 mg Oral QHS  . atorvastatin  80 mg Oral Daily  . carvedilol  6.25 mg Oral BID WC  . clopidogrel  75 mg Oral Q breakfast  . colchicine  0.3 mg Oral Daily  . ezetimibe  10 mg Oral Daily  . finasteride  5 mg Oral Daily  . levothyroxine  75 mcg Oral QAC breakfast  . liothyronine  10 mcg Oral Daily  . loratadine  10 mg Oral Daily  . sacubitril-valsartan  1 tablet Oral BID  . sodium chloride flush  3 mL Intravenous Q12H  . spironolactone  12.5 mg Oral Daily  . tamsulosin  0.4 mg Oral QHS   Continuous Infusions: . sodium chloride     PRN Meds: sodium chloride, acetaminophen, fluticasone, morphine injection, nitroGLYCERIN, ondansetron (ZOFRAN) IV, sodium chloride flush, traMADol   Vital Signs    Vitals:   09/13/19 1955 09/14/19 0029 09/14/19 0136 09/14/19 0338  BP: 112/69 94/64  107/60  Pulse: 69 63  98  Resp: 20   19  Temp: (!) 97.4 F (36.3 C)  97.6 F (36.4 C) 98.4 F (36.9 C)  TempSrc: Oral  Oral Oral  SpO2: 97%  99% 99%  Weight:    106.8 kg  Height:        Intake/Output Summary (Last 24 hours) at 09/14/2019 0805 Last data filed at 09/13/2019 1256 Gross per 24 hour  Intake 200 ml  Output --  Net 200 ml   Last 3 Weights 09/14/2019 09/13/2019 09/12/2019  Weight (lbs) 235 lb 6.4 oz 236 lb 8 oz 234 lb 14.4 oz  Weight (kg) 106.777 kg 107.276 kg 106.55 kg      Telemetry    Atrial fibrillation at 50-60s- Personally Reviewed  ECG    No new tracing   Physical Exam   GEN: No acute distress.   Neck: No JVD Cardiac: Irregularly irregular, +  murmurs, rubs, or gallops.  Respiratory: Clear to auscultation bilaterally. GI: Soft, nontender, non-distended   MS: No edema; No deformity. Neuro:  Nonfocal  Psych: Normal affect   Labs    High Sensitivity Troponin:   Recent Labs  Lab 09/07/19 2240 09/08/19 0042  TROPONINIHS 314* 304*      Chemistry Recent Labs  Lab 09/07/19 2239 09/08/19 1254 09/09/19 0707 09/09/19 0959 09/09/19 1002 09/10/19 0418 09/14/19 0438  NA 138   < > 139   < > 140 137 138  K 3.2*   < > 3.6   < > 3.6 3.6 3.8  CL 102   < > 101  --   --  104 107  CO2 27   < > 27  --   --  25 20*  GLUCOSE 148*   < > 139*  --   --  125* 122*  BUN 24*   < > 14  --   --  11 17  CREATININE 0.92   < > 1.01  --   --  0.92 1.10  CALCIUM 8.9   < > 9.1  --   --  8.9 8.5*  PROT 6.9  --   --   --   --   --   --  ALBUMIN 3.8  --   --   --   --   --   --   AST 31  --   --   --   --   --   --   ALT 34  --   --   --   --   --   --   ALKPHOS 54  --   --   --   --   --   --   BILITOT <0.1*  --   --   --   --   --   --   GFRNONAA >60   < > >60  --   --  >60 >60  GFRAA >60   < > >60  --   --  >60 >60  ANIONGAP 9   < > 11  --   --  8 11   < > = values in this interval not displayed.     Hematology Recent Labs  Lab 09/08/19 1254 09/08/19 1254 09/09/19 0707 09/09/19 0959 09/09/19 1001 09/09/19 1002 09/10/19 0418  WBC 9.4  --  10.5  --   --   --  8.8  RBC 4.72  --  4.60  --   --   --  4.50  HGB 13.6   < > 13.3   < > 13.9 13.6 13.2  HCT 42.2   < > 41.3   < > 41.0 40.0 39.7  MCV 89.4  --  89.8  --   --   --  88.2  MCH 28.8  --  28.9  --   --   --  29.3  MCHC 32.2  --  32.2  --   --   --  33.2  RDW 13.6  --  13.7  --   --   --  13.6  PLT 142*  --  134*  --   --   --  129*   < > = values in this interval not displayed.    BNP Recent Labs  Lab 09/08/19 1800  BNP 264.7*     Radiology    ECHO TEE  Result Date: 09/13/2019    TRANSESOPHOGEAL ECHO REPORT   Patient Name:   Raymond Christensen Date of Exam: 09/13/2019 Medical Rec #:  [id number]      Height:       68.0 in Accession #:    [id number]     Weight:       236.5 lb Date of  Birth:  [birth year]      BSA:          2.195 m Patient Age:    79 years       BP:           100/60 mmHg Patient Gender: M              HR:           69 bpm. Exam Location:  Inpatient Procedure: Transesophageal Echo, Cardiac Doppler, Color Doppler and 3D Echo Indications:     Atrial Fibrillation; Mitral Regurgitation  History:         Patient has prior history of Echocardiogram examinations, most                  recent 09/08/2019. CAD; Risk Factors:Diabetes and Dyslipidemia.  Sonographer:     Mikki Santee RDCS (AE) Referring Phys:  [id number] Leanor Kail Diagnosing Phys: [name] MD PROCEDURE: After discussion of the risks and  benefits of a TEE, an informed consent was obtained from the patient. The transesophogeal probe was passed without difficulty through the esophogus of the patient. Local oropharyngeal anesthetic was provided with Cetacaine. Sedation performed by different physician. The patient was monitored while under deep sedation. Anesthestetic sedation was provided intravenously by Anesthesiology: 206.66mg  of Propofol. The patient developed no complications during the procedure. He had episodes of hypotension and airway obstruction, but this was able to be managed by the anesthesia team. IMPRESSIONS  1. Left ventricular ejection fraction, by estimation, is 40 to 45%. The left ventricle has mildly decreased function. Left ventricular endocardial border not optimally defined to evaluate regional wall motion.  2. Right ventricular systolic function is normal. The right ventricular size is normal.  3. A left atrial/left atrial appendage thrombus was detected.  4. Moderate to severe regurgitation; severe by ERO and MR radius, moderate by Rvol. Unable to pulse PV as he was not tolerating procedure well at the time and expedited images were obtained. There appears to be a ruptured chord and partial prolapsed leaflet of A2. Taken together, highly suggestive of severe eccentric MR.. The mitral  valve is degenerative. Moderate to severe mitral valve regurgitation.  5. The aortic valve is tricuspid. Aortic valve regurgitation is mild. No aortic stenosis is present.  6. There is Moderate (Grade III) plaque involving the descending aorta. Conclusion(s)/Recommendation(s): Likely severe MR given measurements and eccentric jet. There is thrombus of the left atrial appendage, so cardioversion not attempted. FINDINGS  Left Ventricle: Left ventricular ejection fraction, by estimation, is 40 to 45%. The left ventricle has mildly decreased function. Left ventricular endocardial border not optimally defined to evaluate regional wall motion. The left ventricular internal cavity size was normal in size. There is no left ventricular hypertrophy. Right Ventricle: The right ventricular size is normal. No increase in right ventricular wall thickness. Right ventricular systolic function is normal. Left Atrium: There is echodensity in the left atrial appendage, separate from artifact from the coumadin ridge. This is best seen on biplane in multiple views. Therefore cardioversion not performed. Left atrial size was not assessed. A left atrial/left atrial appendage thrombus was detected. Right Atrium: Right atrial size was not assessed. Pericardium: There is no evidence of pericardial effusion. Mitral Valve: Moderate to severe regurgitation; severe by ERO and MR radius, moderate by Rvol. Unable to pulse PV as he was not tolerating procedure well at the time and expedited images were obtained. There appears to be a ruptured chord and partial prolapsed leaflet of A2. Taken together, highly suggestive of severe eccentric MR. The mitral valve is degenerative in appearance. Moderate to severe mitral valve regurgitation. There is no evidence of mitral valve vegetation. Tricuspid Valve: The tricuspid valve is normal in structure. Tricuspid valve regurgitation is mild . No evidence of tricuspid stenosis. There is no evidence of  tricuspid valve vegetation. Aortic Valve: The aortic valve is tricuspid. Aortic valve regurgitation is mild. No aortic stenosis is present. There is no evidence of aortic valve vegetation. Pulmonic Valve: The pulmonic valve was grossly normal. Pulmonic valve regurgitation is mild. There is no evidence of pulmonic valve vegetation. Aorta: The aortic root is normal in size and structure. There is moderate (Grade III) plaque involving the descending aorta. IAS/Shunts: No atrial level shunt detected by color flow Doppler.  MR Peak grad:    91.4 mmHg   TRICUSPID VALVE MR Mean grad:    60.5 mmHg   TR Peak grad:   39.4 mmHg MR Vmax:  478.00 cm/s TR Vmax:        314.00 cm/s MR Vmean:        371.5 cm/s MR PISA:         6.28 cm MR PISA Eff ROA: 40 mm MR PISA Radius:  1.00 cm [name] MD Electronically signed by [name] MD Signature Date/Time: 09/13/2019/8:54:48 PM    Final     Cardiac Studies    TEE 09/13/19 1. Left ventricular ejection fraction, by estimation, is 40 to 45%. The  left ventricle has mildly decreased function. Left ventricular endocardial  border not optimally defined to evaluate regional wall motion.  2. Right ventricular systolic function is normal. The right ventricular  size is normal.  3. A left atrial/left atrial appendage thrombus was detected.  4. Moderate to severe regurgitation; severe by ERO and MR radius,  moderate by Rvol. Unable to pulse PV as he was not tolerating procedure  well at the time and expedited images were obtained. There appears to be a  ruptured chord and partial prolapsed  leaflet of A2. Taken together, highly suggestive of severe eccentric MR..  The mitral valve is degenerative. Moderate to severe mitral valve  regurgitation.  5. The aortic valve is tricuspid. Aortic valve regurgitation is mild. No  aortic stenosis is present.  6. There is Moderate (Grade III) plaque involving the descending aorta.    RIGHT/LEFT HEART  CATH AND CORONARY/GRAFT ANGIOGRAPHY  Conclusion    Mid LM to Dist LM lesion is 70% stenosed.  1st Mrg lesion is 75% stenosed.  2nd Mrg-1 lesion is 90% stenosed.  2nd Mrg-2 lesion is 95% stenosed.  Mid RCA to Dist RCA lesion is 100% stenosed.  Prox RCA to Mid RCA lesion is 90% stenosed.  2nd Diag lesion is 75% stenosed.  Origin to Prox Graft lesion between 2nd Mrg and RPDA is 99% stenosed.  Prox Graft to Mid Graft lesion between Ramus and 2nd Mrg is 75% stenosed.  A drug-eluting stent was successfully placed using a STENT RESOLUTE ONYX 3.0X18.  Post intervention, there is a 0% residual stenosis.  A drug-eluting stent was successfully placed.  Post intervention, there is a 0% residual stenosis.  Hemodynamic findings consistent with mitral valve regurgitation.  IMPRESSION:Mr. Zentner has an occluded RCA with an occluded second diagonal branch vein graft at the aorta and high-grade disease in a sequential vein graft supplying the second obtuse marginal branch and PDA. His filling pressures are fairly low. I placed 2 sequential drug-eluting stents with excellent result. It is unclear whether his MR is structural or functional but I suspect he may require a transesophageal echo to further evaluate. The sheath will be removed once the ACT falls below 170 and pressure held. He will be gently hydrated. He will need to be treated with an oral anticoagulant for his A. fib as well as aspirin and Plavix for 30 days after which the aspirin can be discontinued. He will need at least 12 months of Plavix and long-term oral anticoagulation.  Diagnostic Dominance: Right  Intervention   TTE 09/08/19 1. Poor quality study despite contrast. EF 35-40% with severe HK of the  posterior wall. Moderate to severe MR related with eccentric jet that is  poorly quantitated. Would strongly consider TEE.  2. Left ventricular ejection fraction, by estimation, is 35 to 40%. The  left ventricle  has moderately decreased function. The left ventricle  demonstrates regional wall motion abnormalities (see scoring  diagram/findings for description). Left ventricular  diastolic function could  not be evaluated.  3. Right ventricular systolic function is normal. The right ventricular  size is normal. There is normal pulmonary artery systolic pressure. The  estimated right ventricular systolic pressure is 32.9 mmHg.  4. Left atrial size was moderately dilated.  5. Right atrial size was mildly dilated.  6. There is moderate to severe MR that is poorly quantitated, posteriorly  directed, and eccentric. Suspect this is related to restricted movement of  the PMVL is systole due to posterior wall hypokinesis (IIIB pathology).  There were poor acoustic windows  for TTE as well. Would consider a TEE for better evaluation of the  pathology if clinically indicated. The mitral valve is abnormal. Moderate  to severe mitral valve regurgitation. No evidence of mitral stenosis.  7. The aortic valve is tricuspid. Aortic valve regurgitation is not  visualized. Mild aortic valve sclerosis is present, with no evidence of  aortic valve stenosis.  8. The inferior vena cava is normal in size with greater than 50%  respiratory variability, suggesting right atrial pressure of 3 mmHg.    Patient Profile     79 y.o.malewith a history of MI in 1994 c/b cardiac arrest s/p lytics and PTCA, CABG in 2001 (LIMA to LAD, SVG to OM-2, SVG to right PDA, SVG to D-2) with post-op AF, carotid stenosis s/p L CEA and right subclavian stenosis, diet-controlled DM, HTN, obesity, OSA on CPAP, mild cognitive impairment, depression/mood disorder, and hypothyroidism. Admitted with new onsetafib,elevated troponinI and new reducedEF, 35-40%withmoderate to severe MR.  TEE showed A left atrial/left atrial appendage thrombus. Moderate to severe MR with appears to be a ruptured chord and partial prolapsed leaflet of A2.    Assessment & Plan    1. Unstable angina with hx of CAD s/p CABG - Cath this admission as above. Severe native multivessel disease with 75% mid and 99% distal SVG to OM/PDA stenosis both managed with separate PCI/DES. - Continue triple therapy for one month and stop ASA after 1 month 10/10/19.  - No chest pain.  - Continue statin and BB.   2. New onset atrial fibrillation  - Rate controlled. Unknown duration.  - Unable to undergo cardioversion due to LA thrombus on TEE - Continue Eliquis  3. Acute combined CHF - Echo this admission showed EF 35-40% - felt to be both ischemic in nature, as well as rate related due to new onset atrial fibrillation. - Ramipril changed to Entresto - Continue Core and Spironolactone - Pending benefit check for SGLT-2 inhibitor >>consider adding   4. Mitral regurgitation  - TEE showed moderate to severe MR with appears to be a ruptured chord and partial prolapsed leaflet of A2.   5. DM - A1c 6.6 - Pendign SGLT-2 inhibitor benefit  6.HLD - 09/08/2019: Cholesterol 135; HDL 42; LDL Cholesterol 76; Triglycerides 85; VLDL 17  -Simvastatin transitioned to atorvastatin 80mg  daily and home zetia continued.  - Repeat labs in 6 weeks  7. HTN - BP stable on current medications  Likely discharge today. Dr. Angelena Form to see. Follow up has been arranged.   For questions or updates, please contact [place] Please consult [email or web address] for contact info under        Signed, Leanor Kail, PA  09/14/2019, 8:05 AM    I have personally seen and examined this patient. I agree with the assessment and plan as outlined above. Doing well this am. No chest pain or dyspnea. Tele with continued Atrial fib. No DCCV yesterday due to LAA thrombus. Continue  ASA, Plavix and Eliquis for 1 month then stop ASA. He will need reassessment of his LV function and mitral valve disease in 3 months. Follow up in our office with Dr. Tamala Julian and his care team APP.   OK to  d/c home today.   Lauree Chandler 09/14/2019 10:40 AM  OK to d/c home today.

## 2019-09-14 NOTE — Discharge Summary (Addendum)
Physician Discharge Summary  Raymond Christensen EPP:[id number] DOB: May 18, 1940 DOA: 09/07/2019  PCP: [name], DO  Admit date: 09/07/2019 Discharge date: 09/14/2019  Admitted From: Home Disposition:  Home  Recommendations for Outpatient Follow-up:  1. Follow up with Cards in 1-2 weeks 2. Please obtain BMP/CBC in one week 3. Will need a TEE in 4-8 weeks, re-evalaute MV and LA thrombus  Home Health:Yes Equipment/Devices:None Discharge Condition: Stable CODE STATUS: Full Diet recommendation: Heart Healthy  Brief/Interim Summary: 79 y.o. male past medical history of essential hypertension, hyperlipidemia diet-controlled diabetes mellitus type 2 CAD and peripheral vascular disease status post left carotid endarterectomy morbid obesity obstructive sleep apnea comes in with epigastric abdominal pain that started 1 week prior to admission.  He tachycardic and tachypneic with a low potassium troponin I 300 and EKG A. fib   Discharge Diagnoses:  Principal Problem:   NSTEMI (non-ST elevated myocardial infarction) (Blunt) Active Problems:   Hypothyroidism   Obstructive sleep apnea on CPAP   Diabetes mellitus type 2 in obese (HCC)   Elevated troponin   Thrombocytopenia (HCC)   Paroxysmal atrial fibrillation (HCC)   CAD (coronary artery disease) of artery bypass graft   Acute idiopathic gout involving toe of left foot   Status post coronary artery stent placement   Persistent atrial fibrillation (HCC)   Chronic systolic heart failure (HCC)   Unstable angina (HCC)   Nonrheumatic mitral valve regurgitation  NSTEMI/unstable angina: Cardiac biomarkers was mildly elevated, 2D echo showed an EF of 35% with severe hypokinesia.  Cardiology was consulted he was started on IV heparin statins and nitroglycerin. Cardiac cath was done on 09/09/2019 and 2 drug-eluting stents were placed one in the graft and one in the left circumflex. He remained chest pain-free he will continue aspirin and  Plavix for 30 days along with Eliquis.  Will drop aspirin in 30 days and then continue Plavix and Eliquis. Cardiology was concerned that A. fib is also contributing to his systolic heart failure so they recommended DC cardioversion.  Paroxysmal atrial fibrillation: With a chads Vascor of 5 now on Eliquis. TEE was done and showed an LA thrombus, with severe mitral regurgitation with questionable rupture of chordae and partial prolapse of the A2. Cardiology recommended to continue treat medically and follow-up with them as an outpatient.  Severe mitral regurgitation: TEE showed possible ruptured cord and partial prolapse of the leaflet A2 he will need a follow-up TEE as an outpatient.  Acute combined systolic and diastolic heart failure: Cardiology was consulted he was diuresed, there was a concern that mitral regurgitation was contributing to his systolic dysfunction. Medications were titrated. He was started on Entresto, metoprolol was DC'd and he also started on Coreg and Aldactone. We will continue these medications along with low-dose Lasix as an outpatient will need to see cardiology in 2 to 4 weeks for further evaluation. He will probably benefit from SGLT 2 inhibitor as an outpatient.  CAD: His statins were increased to Lipitor 80 mg.  Essential hypertension: See above for further details.  Diabetes mellitus type 2: With an A1c of 6.6 no changes made to his medication. He needs to get P approve for SGL 2 inhibitor which will help with his blood glucose control and systolic heart failure.  Hypothyroidism: Continue Synthroid.  Chronic thrombocytopenia No signs of overt bleeding, he will go home on aspirin Plavix and Eliquis he has been oriented about signs of bleeding melena or hematochezia.  Upper abdominal discomfort: This is his anginal equivalent now resolved.  Obstructive sleep apnea: Continue CPAP.  Right great toe pain: Likely due to gout he was started on  colchicine is improved.     Discharge Instructions  Discharge Instructions    Amb Referral to Cardiac Rehabilitation   Complete by: As directed    Diagnosis:  Coronary Stents PTCA NSTEMI     After initial evaluation and assessments completed: Virtual Based Care may be provided alone or in conjunction with Phase 2 Cardiac Rehab based on patient barriers.: Yes   Diet - low sodium heart healthy   Complete by: As directed    Increase activity slowly   Complete by: As directed      Allergies as of 09/14/2019      Reactions   Sulfa Antibiotics Rash   Questionable, he developed a diffuse rash 2 days after stopping Bactrim      Medication List    STOP taking these medications   acetaminophen-codeine 120-12 MG/5ML solution   Armodafinil 250 MG tablet   chlorthalidone 25 MG tablet Commonly known as: HYGROTON   ramipril 10 MG capsule Commonly known as: ALTACE   simvastatin 40 MG tablet Commonly known as: ZOCOR   Zinc 50 MG Tabs     TAKE these medications   amiodarone 200 MG tablet Commonly known as: PACERONE Take 1 tablet (200 mg total) by mouth 2 (two) times daily.   apixaban 5 MG Tabs tablet Commonly known as: ELIQUIS Take 1 tablet (5 mg total) by mouth 2 (two) times daily.   aspirin 81 MG EC tablet Take 1 tablet (81 mg total) by mouth at bedtime. Swallow whole. What changed: additional instructions   atorvastatin 80 MG tablet Commonly known as: LIPITOR Take 1 tablet (80 mg total) by mouth daily. Start taking on: September 15, 2019   benzonatate 100 MG capsule Commonly known as: TESSALON Take 1 capsule (100 mg total) by mouth 2 (two) times daily as needed for cough.   carvedilol 6.25 MG tablet Commonly known as: COREG Take 1 tablet (6.25 mg total) by mouth 2 (two) times daily with a meal.   clopidogrel 75 MG tablet Commonly known as: PLAVIX Take 1 tablet (75 mg total) by mouth daily with breakfast. Start taking on: September 15, 2019   colchicine 0.6  MG tablet Take 0.5 tablets (0.3 mg total) by mouth daily. Start taking on: September 15, 2019   ezetimibe 10 MG tablet Commonly known as: ZETIA Take 1 tablet (10 mg total) by mouth daily.   famotidine 20 MG tablet Commonly known as: PEPCID Take 1 tablet (20 mg total) by mouth daily.   finasteride 5 MG tablet Commonly known as: PROSCAR TAKE 1 TABLET DAILY   fluticasone 50 MCG/ACT nasal spray Commonly known as: FLONASE Place 2 sprays into both nostrils daily as needed for allergies or rhinitis.   furosemide 40 MG tablet Commonly known as: LASIX Take 1 tablet (40 mg total) by mouth daily. What changed:   when to take this  reasons to take this   levocetirizine 5 MG tablet Commonly known as: XYZAL TAKE 1 TABLET EVERY EVENING   levothyroxine 75 MCG tablet Commonly known as: SYNTHROID TAKE 1 TABLET DAILY What changed: when to take this   liothyronine 5 MCG tablet Commonly known as: CYTOMEL Take 2 tablets (10 mcg total) by mouth daily.   MULTIVITAMIN ADULT EXTRA C PO Take 1 tablet by mouth daily.   nitroGLYCERIN 0.4 MG SL tablet Commonly known as: NITROSTAT Place 1 tablet (0.4 mg total) under the tongue  every 5 (five) minutes as needed for chest pain.   omega-3 acid ethyl esters 1 g capsule Commonly known as: LOVAZA Take 2 capsules (2 g total) by mouth 2 (two) times daily.   potassium chloride 10 MEQ tablet Commonly known as: Klor-Con 10 Take 2 tabs with every 40 mg dose of Lasix. What changed:   how much to take  how to take this  when to take this  reasons to take this  additional instructions   sacubitril-valsartan 24-26 MG Commonly known as: ENTRESTO Take 1 tablet by mouth 2 (two) times daily.   spironolactone 25 MG tablet Commonly known as: ALDACTONE Take 0.5 tablets (12.5 mg total) by mouth daily. Start taking on: September 15, 2019   tamsulosin 0.4 MG Caps capsule Commonly known as: FLOMAX TAKE 1 CAPSULE DAILY What changed: when to take  this   Vitamin B-12 5000 MCG Lozg Take 1,000 mcg by mouth daily.   Vitamin D3 250 MCG (10000 UT) Tabs Take 10,000 Units by mouth daily.       Follow-up Information    Isaiah Serge, NP. Go on 09/30/2019.   Specialties: Cardiology, Radiology Why: _0 :15am for hospital follow with Dr. Thompson Caul PA/NP Contact information: [address] [place]  [address] [phone]              Allergies  Allergen Reactions  . Sulfa Antibiotics Rash    Questionable, he developed a diffuse rash 2 days after stopping Bactrim    Consultations:  Cardioilogy   Procedures/Studies: DG Chest 2 View  Result Date: 09/07/2019 CLINICAL DATA:  Cough EXAM: CHEST - 2 VIEW COMPARISON:  None. FINDINGS: Cardiac shadow is enlarged. Postsurgical changes are noted. The lungs are clear. Degenerative changes of the thoracic spine are noted. IMPRESSION: No active cardiopulmonary disease. Electronically Signed   By: Inez Catalina M.D.   On: 09/07/2019 17:32   CARDIAC CATHETERIZATION  Result Date: 09/10/2019  Mid LM to Dist LM lesion is 70% stenosed.  1st Mrg lesion is 75% stenosed.  2nd Mrg-1 lesion is 90% stenosed.  2nd Mrg-2 lesion is 95% stenosed.  Mid RCA to Dist RCA lesion is 100% stenosed.  Prox RCA to Mid RCA lesion is 90% stenosed.  2nd Diag lesion is 75% stenosed.  Origin to Prox Graft lesion between 2nd Mrg and RPDA is 99% stenosed.  Prox Graft to Mid Graft lesion between Ramus and 2nd Mrg is 75% stenosed.  A drug-eluting stent was successfully placed using a STENT RESOLUTE ONYX 3.0X18.  Post intervention, there is a 0% residual stenosis.  A drug-eluting stent was successfully placed.  Post intervention, there is a 0% residual stenosis.  Hemodynamic findings consistent with mitral valve regurgitation.  Raymond Christensen is a 79 y.o. male  [id number] LOCATION:  FACILITY: [facility] PHYSICIAN: Quay Burow, M.D. 09/20/1940 DATE OF PROCEDURE:  09/09/2019 DATE OF DISCHARGE: CARDIAC  CATHETERIZATION / PCI DES SVG Om2-PDA History obtained from chart review.Raymond Christensen is a 79 y.o. male with a history of MI in 1994 c/b cardiac arrest s/p lytics and PTCA, CABG in 2001 (LIMA to LAD, SVG to OM-2, SVG to right PDA, SVG to D-2) with post-op AF, carotid stenosis s/p L CEA and right subclavian stenosis, diet-controlled DM, HTN, obesity, OSA on CPAP, mild cognitive impairment, depression/mood disorder, and hypothyroidism. LV function seems to be preserved based on available records. He is being seen today for the evaluation of 1 week of epigastric discomfort.  His troponins were elevated at 300.  His  EKG showed atrial fibrillation.  2D echo revealed decline in his ejection fraction of 35% with moderate to severe MR.  Based on this was decided to proceed with right and left heart cath to further define his anatomy and physiology with anticipation of potentially needing transesophageal echocardiography to better characterize his mitral valve. PROCEDURE DESCRIPTION: The patient was brought to the second floor [facility] cath lab in the postabsorptive state. He was premedicated with IV Versed and fentanyl. His right groinwas prepped and shaved in usual sterile fashion. Xylocaine 1% was used for local anesthesia. A 5 French sheath was inserted into the right common femoral  artery using standard Seldinger technique.  A 7 French sheath was inserted into the right common femoral vein.  5 French right left Judkins diagnostic catheters along with a a 5 French pigtail catheter were used for selective coronary angiography, selective vein graft and IMA graft angiography, obtaining left heart pressures and supravalvular aortography.  Isovue dye was used for the entirety of the case.  Retrograde aorta, ventricular and pullback pressures were recorded.  A 7 [place] balloontipped demolition Swan-Ganz catheter was advanced through the right heart chambers obtaining sequential pressures and blood samples for the  determination of Fick and thermodilution cardiac outputs. The patient received a total of 12,000 as of heparin with an ACT of greater than 300.  He received Plavix 600 mg p.o. along with Pepcid 20 mg IV.  On the pigtail was used for the entirety of the intervention.  Retrograde or pressures monitored during the case. Using a 6 [place] RCB guide catheter along with a 0.14 [place] guidewire and a 2 mm x 12 mm balloon I was able to engage the ostium of the sequential vein graft and passed a wire across the tightest stenosis just beyond the first anastomosis to OM 2.  I predilated with a 2 mm x 12 mm balloon establishing antegrade flow.  I then placed a 3 mm x 18 mm long Medtronic Onyx resolute drug-eluting stent deployed at 14 atm (3.1 mm) resulting reduction of a 99% stenosis to 0% residual.  Following this I direct stented an area of an intraluminal filling defect that appeared to be calcium with a 2.75 mm x 16 mm long Synergy drug-eluting stent deployed at 14 atm (2.8 mm) resulting reduction of a 75% mid followed by 99% mid sequential SVG to OM 2 and PDA with excellent flow.  The patient tolerated procedure well.  The guidewire and catheter were removed and the sheaths were secured in place.   Mr. Klumpp has an occluded RCA with an occluded second diagonal branch vein graft at the aorta and high-grade disease in a sequential vein graft supplying the second obtuse marginal branch and PDA.  His filling pressures are fairly low.  I placed 2 sequential drug-eluting stents with excellent result.  It is unclear whether his MR is structural or functional but I suspect he may require a transesophageal echo to further evaluate.  The sheath will be removed once the ACT falls below 170 and pressure held.  He will be gently hydrated.  He will need to be treated with an oral anticoagulant for his A. fib as well as aspirin and Plavix for 30 days after which the aspirin can be discontinued.  He will need at least 12 months of  Plavix and long-term oral anticoagulation. Quay Burow. MD, [facility] 09/09/2019 11:41 AM   DG Foot 2 Views Left  Result Date: 09/10/2019 CLINICAL DATA:  Pain and redness  first digit EXAM: LEFT FOOT - 2 VIEW COMPARISON:  None. FINDINGS: Frontal and lateral views were obtained. No fracture or dislocation. Joint spaces appear unremarkable. No erosive change or bony destruction. No radiopaque foreign body. There is a small inferior calcaneal spur. There are multiple foci of arterial vascular calcification. No evidence soft tissue air. IMPRESSION: Multiple foci of arterial vascular calcification. No fracture or dislocation. No bony destruction or erosion. No soft tissue air. Inferior calcaneal spur present. Electronically Signed   By: Lowella Grip III M.D.   On: 09/10/2019 10:21   ECHOCARDIOGRAM COMPLETE  Result Date: 09/08/2019    ECHOCARDIOGRAM REPORT   Patient Name:   Raymond Christensen Date of Exam: 09/08/2019 Medical Rec #:  [id number]      Height:       68.0 in Accession #:    [id number]     Weight:       238.5 lb Date of Birth:  1940-03-20      BSA:          2.203 m Patient Age:    89 years       BP:           100/85 mmHg Patient Gender: M              HR:           97 bpm. Exam Location:  Inpatient Procedure: 2D Echo and Intracardiac Opacification Agent Indications:    NSTEMI I21.4  History:        Patient has no prior history of Echocardiogram examinations.                 CAD; Risk Factors:Dyslipidemia and Diabetes.  Sonographer:    Mikki Santee RDCS (AE) Referring Phys: [id number] RONDELL A SMITH IMPRESSIONS  1. Poor quality study despite contrast. EF 35-40% with severe HK of the posterior wall. Moderate to severe MR related with eccentric jet that is poorly quantitated. Would strongly consider TEE.  2. Left ventricular ejection fraction, by estimation, is 35 to 40%. The left ventricle has moderately decreased function. The left ventricle demonstrates regional wall motion abnormalities (see scoring  diagram/findings for description). Left ventricular  diastolic function could not be evaluated.  3. Right ventricular systolic function is normal. The right ventricular size is normal. There is normal pulmonary artery systolic pressure. The estimated right ventricular systolic pressure is 51.7 mmHg.  4. Left atrial size was moderately dilated.  5. Right atrial size was mildly dilated.  6. There is moderate to severe MR that is poorly quantitated, posteriorly directed, and eccentric. Suspect this is related to restricted movement of the PMVL is systole due to posterior wall hypokinesis (IIIB pathology). There were poor acoustic windows  for TTE as well. Would consider a TEE for better evaluation of the pathology if clinically indicated. The mitral valve is abnormal. Moderate to severe mitral valve regurgitation. No evidence of mitral stenosis.  7. The aortic valve is tricuspid. Aortic valve regurgitation is not visualized. Mild aortic valve sclerosis is present, with no evidence of aortic valve stenosis.  8. The inferior vena cava is normal in size with greater than 50% respiratory variability, suggesting right atrial pressure of 3 mmHg. FINDINGS  Left Ventricle: Left ventricular ejection fraction, by estimation, is 35 to 40%. The left ventricle has moderately decreased function. The left ventricle demonstrates regional wall motion abnormalities. Definity contrast agent was given IV to delineate the left ventricular endocardial borders. The left ventricular internal cavity size was normal in  size. There is no left ventricular hypertrophy. Abnormal (paradoxical) septal motion consistent with post-operative status. Left ventricular diastolic function could not be evaluated due to atrial fibrillation. Left ventricular diastolic function could not be evaluated.  LV Wall Scoring: The posterior wall is hypokinetic. Right Ventricle: The right ventricular size is normal. No increase in right ventricular wall thickness. Right  ventricular systolic function is normal. There is normal pulmonary artery systolic pressure. The tricuspid regurgitant velocity is 2.40 m/s, and  with an assumed right atrial pressure of 3 mmHg, the estimated right ventricular systolic pressure is 78.9 mmHg. Left Atrium: Left atrial size was moderately dilated. Right Atrium: Right atrial size was mildly dilated. Pericardium: Trivial pericardial effusion is present. Mitral Valve: There is moderate to severe MR that is poorly quantitated, posteriorly directed, and eccentric. Suspect this is related to restricted movement of the PMVL is systole due to posterior wall hypokinesis (IIIB pathology). There were poor acoustic windows for TTE as well. Would consider a TEE for better evaluation of the pathology if clinically indicated. The mitral valve is abnormal. Moderate to severe mitral valve regurgitation. No evidence of mitral valve stenosis. Tricuspid Valve: The tricuspid valve is grossly normal. Tricuspid valve regurgitation is mild . No evidence of tricuspid stenosis. Aortic Valve: The aortic valve is tricuspid. Aortic valve regurgitation is not visualized. Mild aortic valve sclerosis is present, with no evidence of aortic valve stenosis. Pulmonic Valve: The pulmonic valve was grossly normal. Pulmonic valve regurgitation is trivial. No evidence of pulmonic stenosis. Aorta: The aortic root and ascending aorta are structurally normal, with no evidence of dilitation. Venous: The inferior vena cava is normal in size with greater than 50% respiratory variability, suggesting right atrial pressure of 3 mmHg. IAS/Shunts: The atrial septum is grossly normal.  LEFT VENTRICLE PLAX 2D LVIDd:         5.08 cm LVIDs:         3.36 cm LV PW:         0.88 cm LV IVS:        1.18 cm LVOT diam:     2.30 cm LV SV:         47 LV SV Index:   21 LVOT Area:     4.15 cm  RIGHT VENTRICLE RV S prime:     8.27 cm/s TAPSE (M-mode): 1.6 cm LEFT ATRIUM              Index       RIGHT ATRIUM            Index LA diam:        5.00 cm  2.27 cm/m  RA Area:     23.50 cm LA Vol (A2C):   96.9 ml  43.99 ml/m RA Volume:   63.30 ml  28.74 ml/m LA Vol (A4C):   130.0 ml 59.02 ml/m LA Biplane Vol: 116.0 ml 52.66 ml/m  AORTIC VALVE LVOT Vmax:   61.30 cm/s LVOT Vmean:  38.567 cm/s LVOT VTI:    0.112 m  AORTA Ao Root diam: 3.30 cm MR Peak grad: 97.8 mmHg   TRICUSPID VALVE MR Mean grad: 68.5 mmHg   TR Peak grad:   23.0 mmHg MR Vmax:      494.50 cm/s TR Vmax:        240.00 cm/s MR Vmean:     398.0 cm/s  SHUNTS                           Systemic VTI:  0.11 m                           Systemic Diam: 2.30 cm Eleonore Chiquito MD Electronically signed by Eleonore Chiquito MD Signature Date/Time: 09/08/2019/3:34:47 PM    Final    ECHO TEE  Result Date: 09/13/2019    TRANSESOPHOGEAL ECHO REPORT   Patient Name:   Raymond Christensen Date of Exam: 09/13/2019 Medical Rec #:  [id number]      Height:       68.0 in Accession #:    [id number]     Weight:       236.5 lb Date of Birth:  [birth year]      BSA:          2.195 m Patient Age:    29 years       BP:           100/60 mmHg Patient Gender: M              HR:           69 bpm. Exam Location:  Inpatient Procedure: Transesophageal Echo, Cardiac Doppler, Color Doppler and 3D Echo Indications:     Atrial Fibrillation; Mitral Regurgitation  History:         Patient has prior history of Echocardiogram examinations, most                  recent 09/08/2019. CAD; Risk Factors:Diabetes and Dyslipidemia.  Sonographer:     Mikki Santee RDCS (AE) Referring Phys:  [id number] Leanor Kail Diagnosing Phys: [name] MD PROCEDURE: After discussion of the risks and benefits of a TEE, an informed consent was obtained from the patient. The transesophogeal probe was passed without difficulty through the esophogus of the patient. Local oropharyngeal anesthetic was provided with Cetacaine. Sedation performed by different physician. The patient was monitored while under deep  sedation. Anesthestetic sedation was provided intravenously by Anesthesiology: 206.44m of Propofol. The patient developed no complications during the procedure. He had episodes of hypotension and airway obstruction, but this was able to be managed by the anesthesia team. IMPRESSIONS  1. Left ventricular ejection fraction, by estimation, is 40 to 45%. The left ventricle has mildly decreased function. Left ventricular endocardial border not optimally defined to evaluate regional wall motion.  2. Right ventricular systolic function is normal. The right ventricular size is normal.  3. A left atrial/left atrial appendage thrombus was detected.  4. Moderate to severe regurgitation; severe by ERO and MR radius, moderate by Rvol. Unable to pulse PV as he was not tolerating procedure well at the time and expedited images were obtained. There appears to be a ruptured chord and partial prolapsed leaflet of A2. Taken together, highly suggestive of severe eccentric MR.. The mitral valve is degenerative. Moderate to severe mitral valve regurgitation.  5. The aortic valve is tricuspid. Aortic valve regurgitation is mild. No aortic stenosis is present.  6. There is Moderate (Grade III) plaque involving the descending aorta. Conclusion(s)/Recommendation(s): Likely severe MR given measurements and eccentric jet. There is thrombus of the left atrial appendage, so cardioversion not attempted. FINDINGS  Left Ventricle: Left ventricular ejection fraction, by estimation, is 40 to 45%. The left ventricle has mildly decreased function. Left ventricular endocardial border not optimally defined to evaluate regional  wall motion. The left ventricular internal cavity size was normal in size. There is no left ventricular hypertrophy. Right Ventricle: The right ventricular size is normal. No increase in right ventricular wall thickness. Right ventricular systolic function is normal. Left Atrium: There is echodensity in the left atrial appendage,  separate from artifact from the coumadin ridge. This is best seen on biplane in multiple views. Therefore cardioversion not performed. Left atrial size was not assessed. A left atrial/left atrial appendage thrombus was detected. Right Atrium: Right atrial size was not assessed. Pericardium: There is no evidence of pericardial effusion. Mitral Valve: Moderate to severe regurgitation; severe by ERO and MR radius, moderate by Rvol. Unable to pulse PV as he was not tolerating procedure well at the time and expedited images were obtained. There appears to be a ruptured chord and partial prolapsed leaflet of A2. Taken together, highly suggestive of severe eccentric MR. The mitral valve is degenerative in appearance. Moderate to severe mitral valve regurgitation. There is no evidence of mitral valve vegetation. Tricuspid Valve: The tricuspid valve is normal in structure. Tricuspid valve regurgitation is mild . No evidence of tricuspid stenosis. There is no evidence of tricuspid valve vegetation. Aortic Valve: The aortic valve is tricuspid. Aortic valve regurgitation is mild. No aortic stenosis is present. There is no evidence of aortic valve vegetation. Pulmonic Valve: The pulmonic valve was grossly normal. Pulmonic valve regurgitation is mild. There is no evidence of pulmonic valve vegetation. Aorta: The aortic root is normal in size and structure. There is moderate (Grade III) plaque involving the descending aorta. IAS/Shunts: No atrial level shunt detected by color flow Doppler.  MR Peak grad:    91.4 mmHg   TRICUSPID VALVE MR Mean grad:    60.5 mmHg   TR Peak grad:   39.4 mmHg MR Vmax:         478.00 cm/s TR Vmax:        314.00 cm/s MR Vmean:        371.5 cm/s MR PISA:         6.28 cm MR PISA Eff ROA: 40 mm MR PISA Radius:  1.00 cm [name] MD Electronically signed by [name] MD Signature Date/Time: 09/13/2019/8:54:48 PM    Final       Subjective: No complains  Discharge  Exam: Vitals:   09/14/19 0338 09/14/19 0900  BP: 107/60 117/72  Pulse: 98   Resp: 19   Temp: 98.4 F (36.9 C)   SpO2: 99%    Vitals:   09/14/19 0029 09/14/19 0136 09/14/19 0338 09/14/19 0900  BP: 94/64  107/60 117/72  Pulse: 63  98   Resp:   19   Temp:  97.6 F (36.4 C) 98.4 F (36.9 C)   TempSrc:  Oral Oral   SpO2:  99% 99%   Weight:   106.8 kg   Height:        General: Pt is alert, awake, not in acute distress Cardiovascular: RRR, S1/S2 +, no rubs, no gallops Respiratory: CTA bilaterally, no wheezing, no rhonchi Abdominal: Soft, NT, ND, bowel sounds + Extremities: no edema, no cyanosis    The results of significant diagnostics from this hospitalization (including imaging, microbiology, ancillary and laboratory) are listed below for reference.     Microbiology: Recent Results (from the past 240 hour(s))  SARS Coronavirus 2 by RT PCR (hospital order, performed in [facility] hospital lab) Nasopharyngeal Nasopharyngeal Swab     Status: None   Collection Time: 09/08/19  3:20  AM   Specimen: Nasopharyngeal Swab  Result Value Ref Range Status   SARS Coronavirus 2 NEGATIVE NEGATIVE Final    Comment: (NOTE) SARS-CoV-2 target nucleic acids are NOT DETECTED.  The SARS-CoV-2 RNA is generally detectable in upper and lower respiratory specimens during the acute phase of infection. The lowest concentration of SARS-CoV-2 viral copies this assay can detect is 250 copies / mL. A negative result does not preclude SARS-CoV-2 infection and should not be used as the sole basis for treatment or other patient management decisions.  A negative result may occur with improper specimen collection / handling, submission of specimen other than nasopharyngeal swab, presence of viral mutation(s) within the areas targeted by this assay, and inadequate number of viral copies (<250 copies / mL). A negative result must be combined with clinical observations, patient history, and epidemiological  information.  Fact Sheet for Patients:   StrictlyIdeas.no  Fact Sheet for Healthcare Providers: BankingDealers.co.za  This test is not yet approved or  cleared by the [place] FDA and has been authorized for detection and/or diagnosis of SARS-CoV-2 by FDA under an Emergency Use Authorization (EUA).  This EUA will remain in effect (meaning this test can be used) for the duration of the COVID-19 declaration under Section 564(b)(1) of the Act, 21 U.S.C. section 360bbb-3(b)(1), unless the authorization is terminated or revoked sooner.  Performed at [facility], [address] [place], [place] [address]      Labs: BNP (last 3 results) Recent Labs    09/08/19 1800  BNP 235.5*   Basic Metabolic Panel: Recent Labs  Lab 09/07/19 2239 09/07/19 2239 09/08/19 1254 09/08/19 1254 09/09/19 0707 09/09/19 0707 09/09/19 0959 09/09/19 1001 09/09/19 1002 09/10/19 0418 09/11/19 0223 09/14/19 0438  NA 138   < > 140   < > 139   < > 140 141 140 137  --  138  K 3.2*   < > 3.8   < > 3.6   < > 3.8 3.6 3.6 3.6  --  3.8  CL 102  --  103  --  101  --   --   --   --  104  --  107  CO2 27  --  27  --  27  --   --   --   --  25  --  20*  GLUCOSE 148*  --  133*  --  139*  --   --   --   --  125*  --  122*  BUN 24*  --  16  --  14  --   --   --   --  11  --  17  CREATININE 0.92  --  0.92  --  1.01  --   --   --   --  0.92  --  1.10  CALCIUM 8.9  --  9.3  --  9.1  --   --   --   --  8.9  --  8.5*  MG  --   --   --   --  1.8  --   --   --   --   --  1.8  --    < > = values in this interval not displayed.   Liver Function Tests: Recent Labs  Lab 09/07/19 2239  AST 31  ALT 34  ALKPHOS 54  BILITOT <0.1*  PROT 6.9  ALBUMIN 3.8   Recent Labs  Lab 09/07/19 1621  09/07/19 2239  LIPASE 12 26   No results for input(s): AMMONIA in the last 168 hours. CBC: Recent Labs  Lab 09/07/19 1621 09/07/19 1621 09/07/19 2239  09/07/19 2239 09/08/19 1254 09/08/19 1254 09/09/19 0707 09/09/19 0959 09/09/19 1001 09/09/19 1002 09/10/19 0418  WBC 9.0  --  9.0  --  9.4  --  10.5  --   --   --  8.8  NEUTROABS 5,715  --  5.5  --   --   --   --   --   --   --   --   HGB 13.5   < > 13.5   < > 13.6   < > 13.3 13.6 13.9 13.6 13.2  HCT 40.6   < > 41.2   < > 42.2   < > 41.3 40.0 41.0 40.0 39.7  MCV 87.9  --  89.2  --  89.4  --  89.8  --   --   --  88.2  PLT 150  --  143*  --  142*  --  134*  --   --   --  129*   < > = values in this interval not displayed.   Cardiac Enzymes: Recent Labs  Lab 09/07/19 1621  TROPONINI 514*   BNP: Invalid input(s): POCBNP CBG: Recent Labs  Lab 09/08/19 1239 09/09/19 1157  GLUCAP 134* 123*   D-Dimer No results for input(s): DDIMER in the last 72 hours. Hgb A1c No results for input(s): HGBA1C in the last 72 hours. Lipid Profile No results for input(s): CHOL, HDL, LDLCALC, TRIG, CHOLHDL, LDLDIRECT in the last 72 hours. Thyroid function studies No results for input(s): TSH, T4TOTAL, T3FREE, THYROIDAB in the last 72 hours.  Invalid input(s): FREET3 Anemia work up No results for input(s): VITAMINB12, FOLATE, FERRITIN, TIBC, IRON, RETICCTPCT in the last 72 hours. Urinalysis    Component Value Date/Time   COLORURINE YELLOW 09/07/2019 2317   APPEARANCEUR CLEAR 09/07/2019 2317   LABSPEC 1.025 09/07/2019 2317   PHURINE 5.5 09/07/2019 2317   GLUCOSEU NEGATIVE 09/07/2019 2317   HGBUR NEGATIVE 09/07/2019 2317   BILIRUBINUR NEGATIVE 09/07/2019 2317   KETONESUR NEGATIVE 09/07/2019 2317   PROTEINUR NEGATIVE 09/07/2019 2317   NITRITE NEGATIVE 09/07/2019 2317   LEUKOCYTESUR NEGATIVE 09/07/2019 2317   Sepsis Labs Invalid input(s): PROCALCITONIN,  WBC,  LACTICIDVEN Microbiology Recent Results (from the past 240 hour(s))  SARS Coronavirus 2 by RT PCR (hospital order, performed in [facility] hospital lab) Nasopharyngeal Nasopharyngeal Swab     Status: None   Collection Time:  09/08/19  3:20 AM   Specimen: Nasopharyngeal Swab  Result Value Ref Range Status   SARS Coronavirus 2 NEGATIVE NEGATIVE Final    Comment: (NOTE) SARS-CoV-2 target nucleic acids are NOT DETECTED.  The SARS-CoV-2 RNA is generally detectable in upper and lower respiratory specimens during the acute phase of infection. The lowest concentration of SARS-CoV-2 viral copies this assay can detect is 250 copies / mL. A negative result does not preclude SARS-CoV-2 infection and should not be used as the sole basis for treatment or other patient management decisions.  A negative result may occur with improper specimen collection / handling, submission of specimen other than nasopharyngeal swab, presence of viral mutation(s) within the areas targeted by this assay, and inadequate number of viral copies (<250 copies / mL). A negative result must be combined with clinical observations, patient history, and epidemiological information.  Fact Sheet for Patients:   StrictlyIdeas.no  Fact Sheet for  Healthcare Providers: BankingDealers.co.za  This test is not yet approved or  cleared by the [place] and has been authorized for detection and/or diagnosis of SARS-CoV-2 by FDA under an Emergency Use Authorization (EUA).  This EUA will remain in effect (meaning this test can be used) for the duration of the COVID-19 declaration under Section 564(b)(1) of the Act, 21 U.S.C. section 360bbb-3(b)(1), unless the authorization is terminated or revoked sooner.  Performed at [facility], [address] [place], [place] [address]      Time coordinating discharge: Over 40 minutes  SIGNED:   Charlynne Cousins, MD  [facility] 09/14/2019, 1:07 PM Pager   If 7PM-7AM, please contact night-coverage [email or web address] Password [email or web address]

## 2019-09-14 NOTE — Anesthesia Postprocedure Evaluation (Signed)
Anesthesia Post Note  Patient: Raymond Christensen  Procedure(s) Performed: TRANSESOPHAGEAL ECHOCARDIOGRAM (TEE) (N/A )     Patient location during evaluation: PACU Anesthesia Type: General Level of consciousness: awake and alert Pain management: pain level controlled Vital Signs Assessment: post-procedure vital signs reviewed and stable Respiratory status: spontaneous breathing, nonlabored ventilation, respiratory function stable and patient connected to nasal cannula oxygen Cardiovascular status: stable and blood pressure returned to baseline Postop Assessment: no apparent nausea or vomiting Anesthetic complications: no   No complications documented.  Last Vitals:  Vitals:   09/14/19 0338 09/14/19 0900  BP: 107/60 117/72  Pulse: 98   Resp: 19   Temp: 36.9 C   SpO2: 99%     Last Pain:  Vitals:   09/14/19 0338  TempSrc: Oral  PainSc:                  Tiajuana Amass

## 2019-09-14 NOTE — Telephone Encounter (Signed)
Have tried to call again and no answer. PCP is aware of the patients hospitalization.

## 2019-09-14 NOTE — Telephone Encounter (Signed)
     Pt is scheduled for TOC with Cecilie Kicks on 09/30/2019 11:15 am. Scheduled by Robbie Lis

## 2019-09-14 NOTE — Progress Notes (Signed)
CARDIAC REHAB PHASE I   PRE:  Rate/Rhythm: 86 afib    BP: sitting 117/72    SaO2:   MODE:  Ambulation: 1500 ft   POST:  Rate/Rhythm: 135 peak, mostly 120s afib walking    BP: sitting 138/62     SaO2:   Tolerated well, some SOB, he blames mask. Asking about restrictions for driving and activity. Encouraged to slowly build up walking and pay attention to increased SOB. To ask MD regarding driving.   [id number]  Raymond Christensen CES, ACSM 09/14/2019 9:17 AM

## 2019-09-14 NOTE — Care Management Important Message (Signed)
Important Message  Patient Details  Name: Raymond Christensen MRN: [id number] Date of Birth: [birth year]   Medicare Important Message Given:  Yes     Shelda Altes 09/14/2019, 4:03 PM

## 2019-09-15 ENCOUNTER — Telehealth: Payer: Self-pay | Admitting: Interventional Cardiology

## 2019-09-15 MED ORDER — AMIODARONE HCL 200 MG PO TABS
200.0000 mg | ORAL_TABLET | Freq: Two times a day (BID) | ORAL | 1 refills | Status: DC
Start: 2019-09-15 — End: 2019-09-30

## 2019-09-15 NOTE — Telephone Encounter (Signed)
Patient contacted regarding discharge from [facility] on September 14, 2019.  Patient understands to follow up with provider Cecilie Kicks, NP on September 16 at 11:15AM at [facility]. Patient understands discharge instructions? yes Patient understands medications and regiment? yes Patient understands to bring all medications to this visit? yes

## 2019-09-15 NOTE — Telephone Encounter (Signed)
    Pt c/o medication issue:  1. Name of Medication: Methylfolate 15 mg,   amiodarone (PACERONE) 200 MG tablet    2. How are you currently taking this medication (dosage and times per day)?   3. Are you having a reaction (difficulty breathing--STAT)?   4. What is your medication issue? Pt said he did not get a prescription for Methylfolate and would like to know if he needs to continue taking it. Also, he said he was only given 10 days worth of amiodorone, he would like to know if he needs to keep taking it until he sees Cecilie Kicks on 09/16, if he does then he need 6 days refill send to his pharmacy

## 2019-09-15 NOTE — Telephone Encounter (Signed)
Spoke with pt and made him aware that he does need to continue Amiodarone.  Sent prescription to pharmacy.  Advised to reach out to PCP about methylfolate as it is not a cardiac med.  Pt appreciative for call.

## 2019-09-18 ENCOUNTER — Telehealth: Payer: Self-pay | Admitting: Physician Assistant

## 2019-09-18 NOTE — Telephone Encounter (Signed)
   Pt reports feeling lethargic, new since on the rx. His wife states he is in a bad mood.  94/58, HR 70  His weight has been generally stable, but from yesterday to today, it went up 4 lbs. He feels there may have been some Na indiscretion.   Gave instructions on Na at 500 mg q meal and limit fluids to 2 L q/day.  Hold the spiro for now.   If wt does not improve, call back tomorrow, but do not want him to take extra Lasix w/ low BP already.   Continue Coreg, Lasix, Entresto at current doses.   Continue to follow BP, HR.    Hopefully, BP will improve.   Rosaria Ferries, PA-C 09/18/2019 11:01 AM

## 2019-09-18 NOTE — Telephone Encounter (Signed)
Agree. He may be dehydrated or over medicated. Primary Cardiologist is [place] [facility].

## 2019-09-20 ENCOUNTER — Telehealth: Payer: Self-pay | Admitting: Physician Assistant

## 2019-09-20 NOTE — Telephone Encounter (Signed)
Paged by answering service for low blood pressure.  He continues to take his Coreg, spironolactone and Entresto as listed in medication list.  Blood pressure was 75/50 1 hour after taking his morning medication.  He does not remember his blood pressure before taking the medication.  During my evaluation his blood pressure improved to 103/64.  This is 2 hours after taking his morning medication.  He feels fatigue and intermittent tiredness.  After discussion patient decided to stop his spironolactone.  Advised to keep log of blood pressure before taking medication in few hours after>> will bring during office visit for review.

## 2019-09-27 ENCOUNTER — Telehealth: Payer: Self-pay | Admitting: Interventional Cardiology

## 2019-09-27 NOTE — Telephone Encounter (Signed)
Pt was given Colchicine at the hospital due to suspected gout in toe.  Spoke with pt and toe still sore.  Advised pt to reach out to PCP to see if they want him to continue this medication or come in to labs or xrays.  Pt appreciative for call.

## 2019-09-27 NOTE — Telephone Encounter (Signed)
Patient would like to know whether or not he needs to continue taking colchicine 0.6 MG tablet medication. Please advise.

## 2019-09-28 NOTE — Progress Notes (Addendum)
Cardiology Office Note   Date:  09/30/2019   ID:  Raymond Christensen, DOB May 11, 1940, MRN [id number]  PCP:  [name], DO  Cardiologist:  Dr. Tamala Julian    Chief Complaint  Patient presents with  . Hospitalization Follow-up      History of Present Illness: Raymond Christensen is a 79 y.o. male who presents for post hospital.  New onset of a fib, elevated troponin and new reduced Ef 35-40% and mod to severe MR  History of MI in 1994 c/b cardiac arrest s/p lytics and PTCA, CABG in 2001 (LIMA to LAD, SVG to OM-2, SVG to right PDA, SVG to D-2) with post-op AF, carotid stenosis s/p L CEA and right subclavian stenosis, diet-controlled DM, HTN, obesity, OSA on CPAP, mild cognitive impairment, depression/mood disorder, and hypothyroidism. LV function seems to be preserved based on available records, EF of 50-55% in 2010.   TEE showed A left atrial/left atrial appendage thrombus. Moderate to severe MR with appears to be a ruptured chord and partial prolapsed leaflet of A2.   Cath on recent admission. Severe native multivessel disease with 75% mid and 99% distal SVG to OM/PDA stenosis both managed with separate PCI/DES.  Continue triple therapy for one month and stop ASA after 1 month 10/10/19.   On eliquis for a fib, + LA thrombus on TEE - no DCCV for now ramipril changed to entresto  Also on coreg and spironolactone SGLT-2 inhibitor >>consider adding    Today questions answered.  He is out of colchicine but no gout symptoms.  Will refill prn, possibly add probeneicd though uric acid levels were WNL prior to discharge.  Will check with Dr. Tamala Julian    No chest pain and no SOB.  He has some lower ext edema, not interested in wearing stockings as too difficult to pull up.   Watching diet no added salt.  His EF improved from admit to TEE.  No bleeding on eliquis, and plavix and asa.  Not exercising much due to heat, wanting to go to cardiac rehab.    lipids 6 weeks. Echo in 3 months.   Past  Medical History:  Diagnosis Date  . Arthritis   . CAD (coronary artery disease)   . Concussion Summer 2012  . Essential hypertension 02/26/2017  . Heart disease   . Hyperlipidemia   . Hypothyroidism 10/15/2017  . Major depressive disorder   . Obstructive sleep apnea on CPAP   . Poor flexibility of tendon 12/01/2018  . Subungual hematoma of digit of hand 12/01/2018  . Subungual hematoma of right foot 12/01/2018  . Thyroid disease   . Type 2 diabetes mellitus without complication, without long-term current use of insulin ([facility]) 02/26/2017    Past Surgical History:  Procedure Laterality Date  . BYPASS GRAFT  2001  . CAROTID ARTERY ANGIOPLASTY    . RIGHT/LEFT HEART CATH AND CORONARY/GRAFT ANGIOGRAPHY N/A 09/09/2019   Procedure: RIGHT/LEFT HEART CATH AND CORONARY/GRAFT ANGIOGRAPHY;  Surgeon: [name], MD;  Location: [facility];  Service: Cardiovascular;  Laterality: N/A;  . TEE WITHOUT CARDIOVERSION N/A 09/13/2019   Procedure: TRANSESOPHAGEAL ECHOCARDIOGRAM (TEE);  Surgeon: [name], MD;  Location: [facility] ENDOSCOPY;  Service: Cardiovascular;  Laterality: N/A;  . TOTAL HIP ARTHROPLASTY  1994, 1995     Current Outpatient Medications  Medication Sig Dispense Refill  . amiodarone (PACERONE) 200 MG tablet Take 1 tablet (200 mg total) by mouth 2 (two) times daily. 60 tablet 1  . apixaban (ELIQUIS) 5 MG  TABS tablet Take 1 tablet (5 mg total) by mouth 2 (two) times daily. 60 tablet 3  . aspirin 81 MG EC tablet Take 1 tablet (81 mg total) by mouth at bedtime. Swallow whole. 30 tablet 0  . atorvastatin (LIPITOR) 80 MG tablet Take 1 tablet (80 mg total) by mouth daily. 30 tablet 3  . carvedilol (COREG) 6.25 MG tablet Take 1 tablet (6.25 mg total) by mouth 2 (two) times daily with a meal. 60 tablet 3  . Cholecalciferol (VITAMIN D3) 10000 units TABS Take 10,000 Units by mouth daily.     . clopidogrel (PLAVIX) 75 MG tablet Take 1 tablet (75 mg total) by mouth daily with  breakfast. 30 tablet 3  . colchicine 0.6 MG tablet Take 0.5 tablets (0.3 mg total) by mouth daily. 4 tablet 0  . Cyanocobalamin (VITAMIN B-12) 5000 MCG LOZG Take 1,000 mcg by mouth daily.     Marland Kitchen ezetimibe (ZETIA) 10 MG tablet Take 1 tablet (10 mg total) by mouth daily. 90 tablet 1  . famotidine (PEPCID) 20 MG tablet Take 1 tablet (20 mg total) by mouth daily. 30 tablet 3  . finasteride (PROSCAR) 5 MG tablet TAKE 1 TABLET DAILY (Patient taking differently: Take 5 mg by mouth daily. ) 90 tablet 1  . fluticasone (FLONASE) 50 MCG/ACT nasal spray Place 2 sprays into both nostrils daily as needed for allergies or rhinitis.    . furosemide (LASIX) 40 MG tablet Take 1 tablet (40 mg total) by mouth daily. 30 tablet 3  . levocetirizine (XYZAL) 5 MG tablet TAKE 1 TABLET EVERY EVENING (Patient taking differently: Take 5 mg by mouth every evening. ) 90 tablet 1  . levothyroxine (SYNTHROID) 75 MCG tablet TAKE 1 TABLET DAILY (Patient taking differently: Take 75 mcg by mouth daily before breakfast. ) 90 tablet 1  . liothyronine (CYTOMEL) 5 MCG tablet Take 2 tablets (10 mcg total) by mouth daily. 180 tablet 3  . Multiple Vitamins-Minerals (MULTIVITAMIN ADULT EXTRA C PO) Take 1 tablet by mouth daily.     . nitroGLYCERIN (NITROSTAT) 0.4 MG SL tablet Place 1 tablet (0.4 mg total) under the tongue every 5 (five) minutes as needed for chest pain. 30 tablet 12  . omega-3 acid ethyl esters (LOVAZA) 1 g capsule Take 2 capsules (2 g total) by mouth 2 (two) times daily. 360 capsule 2  . potassium chloride (KLOR-CON 10) 10 MEQ tablet Take 2 tabs with every 40 mg dose of Lasix. (Patient taking differently: Take 20 mEq by mouth daily as needed (with every 40mg  dose of Lasix). ) 180 tablet 1  . sacubitril-valsartan (ENTRESTO) 24-26 MG Take 1 tablet by mouth 2 (two) times daily. 60 tablet 3  . tamsulosin (FLOMAX) 0.4 MG CAPS capsule TAKE 1 CAPSULE DAILY (Patient taking differently: Take 0.4 mg by mouth at bedtime. ) 90 capsule 3    No current facility-administered medications for this visit.    Allergies:   Sulfa antibiotics    Social History:  The patient  reports that he has quit smoking. His smoking use included cigarettes. He has never used smokeless tobacco. He reports current alcohol use of about 3.0 standard drinks of alcohol per week. He reports that he does not use drugs.   Family History:  The patient's family history includes Dementia in his father.    ROS:  General:no colds or fevers, + weight increase at home as well.  Skin:no rashes or ulcers HEENT:no blurred vision, no congestion CV:see HPI PUL:see HPI GI:no diarrhea  constipation or melena, no indigestion GU:no hematuria, no dysuria MS:no joint pain, no claudication- gout treated with colchicine resolved Neuro:no syncope, no lightheadedness Endo:+ diabetes, no thyroid disease  Wt Readings from Last 3 Encounters:  09/30/19 242 lb 12.8 oz (110.1 kg)  09/14/19 235 lb 6.4 oz (106.8 kg)  09/07/19 241 lb 1.6 oz (109.4 kg)     PHYSICAL EXAM: VS:  BP 110/68   Pulse 60   Ht 5\' 8"  (1.727 m)   Wt 242 lb 12.8 oz (110.1 kg)   BMI 36.92 kg/m  , BMI Body mass index is 36.92 kg/m. General:Pleasant affect, NAD Skin:Warm and dry, brisk capillary refill HEENT:normocephalic, sclera clear, mucus membranes moist Neck:supple, no JVD, no bruits  Heart:S1S2 RRR with 2-3 murmur- MR,  No gallup, rub or click Lungs:clear without rales, rhonchi, or wheezes JOA:CZYS, non tender, + BS, do not palpate liver spleen or masses Ext:1+ lower ext edema, 2+ pedal pulses, 2+ radial pulses. Bruising at cath site. lt groin, No pain, some bruising has gone down leg but improving, + varicosities  Neuro:alert and oriented X3, MAE, follows commands, + facial symmetry    EKG:  EKG is ordered today. The ekg ordered today demonstrates atrial fib at 60 no acute ST changes, stable EKG   Recent Labs: 06/22/2019: TSH 2.19 09/07/2019: ALT 34 09/08/2019: B Natriuretic Peptide  264.7 09/10/2019: Hemoglobin 13.2; Platelets 129 09/11/2019: Magnesium 1.8 09/14/2019: BUN 17; Creatinine, Ser 1.10; Potassium 3.8; Sodium 138    Lipid Panel    Component Value Date/Time   CHOL 135 09/08/2019 1254   TRIG 85 09/08/2019 1254   HDL 42 09/08/2019 1254   CHOLHDL 3.2 09/08/2019 1254   VLDL 17 09/08/2019 1254   LDLCALC 76 09/08/2019 1254   LDLDIRECT 76.0 06/22/2019 1503       Other studies Reviewed: Additional studies/ records that were reviewed today include: . TEE 09/13/19 1. Left ventricular ejection fraction, by estimation, is 40 to 45%. The  left ventricle has mildly decreased function. Left ventricular endocardial  border not optimally defined to evaluate regional wall motion.  2. Right ventricular systolic function is normal. The right ventricular  size is normal.  3. A left atrial/left atrial appendage thrombus was detected.  4. Moderate to severe regurgitation; severe by ERO and MR radius,  moderate by Rvol. Unable to pulse PV as he was not tolerating procedure  well at the time and expedited images were obtained. There appears to be a  ruptured chord and partial prolapsed  leaflet of A2. Taken together, highly suggestive of severe eccentric MR..  The mitral valve is degenerative. Moderate to severe mitral valve  regurgitation.  5. The aortic valve is tricuspid. Aortic valve regurgitation is mild. No  aortic stenosis is present.  6. There is Moderate (Grade III) plaque involving the descending aorta.    RIGHT/LEFT HEART CATH AND CORONARY/GRAFT ANGIOGRAPHY  Conclusion    Mid LM to Dist LM lesion is 70% stenosed.  1st Mrg lesion is 75% stenosed.  2nd Mrg-1 lesion is 90% stenosed.  2nd Mrg-2 lesion is 95% stenosed.  Mid RCA to Dist RCA lesion is 100% stenosed.  Prox RCA to Mid RCA lesion is 90% stenosed.  2nd Diag lesion is 75% stenosed.  Origin to Prox Graft lesion between 2nd Mrg and RPDA is 99% stenosed.  Prox Graft to Mid Graft  lesion between Ramus and 2nd Mrg is 75% stenosed.  A drug-eluting stent was successfully placed using a STENT RESOLUTE ONYX 3.0X18.  Post intervention,  there is a 0% residual stenosis.  A drug-eluting stent was successfully placed.  Post intervention, there is a 0% residual stenosis.  Hemodynamic findings consistent with mitral valve regurgitation.  IMPRESSION:Mr. Colmenares has an occluded RCA with an occluded second diagonal branch vein graft at the aorta and high-grade disease in a sequential vein graft supplying the second obtuse marginal branch and PDA. His filling pressures are fairly low. I placed 2 sequential drug-eluting stents with excellent result. It is unclear whether his MR is structural or functional but I suspect he may require a transesophageal echo to further evaluate. The sheath will be removed once the ACT falls below 170 and pressure held. He will be gently hydrated. He will need to be treated with an oral anticoagulant for his A. fib as well as aspirin and Plavix for 30 days after which the aspirin can be discontinued. He will need at least 12 months of Plavix and long-term oral anticoagulation.  Diagnostic Dominance: Right  Intervention   TTE 09/08/19 1. Poor quality study despite contrast. EF 35-40% with severe HK of the  posterior wall. Moderate to severe MR related with eccentric jet that is  poorly quantitated. Would strongly consider TEE.  2. Left ventricular ejection fraction, by estimation, is 35 to 40%. The  left ventricle has moderately decreased function. The left ventricle  demonstrates regional wall motion abnormalities (see scoring  diagram/findings for description). Left ventricular  diastolic function could not be evaluated.  3. Right ventricular systolic function is normal. The right ventricular  size is normal. There is normal pulmonary artery systolic pressure. The  estimated right ventricular systolic pressure is 67.6 mmHg.  4. Left  atrial size was moderately dilated.  5. Right atrial size was mildly dilated.  6. There is moderate to severe MR that is poorly quantitated, posteriorly  directed, and eccentric. Suspect this is related to restricted movement of  the PMVL is systole due to posterior wall hypokinesis (IIIB pathology).  There were poor acoustic windows  for TTE as well. Would consider a TEE for better evaluation of the  pathology if clinically indicated. The mitral valve is abnormal. Moderate  to severe mitral valve regurgitation. No evidence of mitral stenosis.  7. The aortic valve is tricuspid. Aortic valve regurgitation is not  visualized. Mild aortic valve sclerosis is present, with no evidence of  aortic valve stenosis.  8. The inferior vena cava is normal in size with greater than 50%  respiratory variability, suggesting right atrial pressure of 3 mmHg.     ASSESSMENT AND PLAN:  1.  CAD with hx CABG and now ACS with unstable angina and pk hs troponin 515.- found to have obstruction 75% mid and 99% distal SVG to OM/PDA stenosis both managed with separate PCI/DES.  No angina, on BB and statin, also on ASA, Eliquis and plavix.- will stop ASA 10/10/19.  Remain on Plavix and Eliquis.  Hospital notes reviewed.   2. New atrial fib on amiodarone and coreg.  Rate today 60 on home evals rate has decreased since d/c, will decrease amiodarone to 200 mg daily.  Follow up in 2 weeks.  May increase coreg at that time if HR and BP allow.  Continue Eliquis. And is on plavix once follow up TEE in 3 months may do DCCV if LA thrombus resolved  Check TSH on amiodarone and LFTs.  3.  Decreased LV function, initially with afib and angina EF 35-40% along with mod to severe MR.  On TEE 09/13/19 (5 days  later after reperfusion) EF 40-45% on entresto, BB, was on spironolactone but with symptomatic low BP this was stopped.  Today BP soft will hold off increasing meds except lasix for 2 days.   4.  Chronic combined systolic and  diastolic CHF wt gain today and lower ext edema will increase lasix to 60 mg daily for 2 days.  Will check CMP today  5. Lt atrial thrombus on eliquis recheck TEE in 3 months and possible DCCv then.  6.  MR moderate to severe and most likely severe MVR - monitor   7.  Moderate grade III plaque involving the descending aorta- p ton statin and BP controlled  8.  Gout has finished colchicine will check labs - will add as PRN  9.  HLD on statin will recheck statin in 6 weeks or so. Continue statin.    10. thrombocytopenia he has had for several years. Stable on last check    Current medicines are reviewed with the patient today.  The patient Has no concerns regarding medicines.  The following changes have been made:  See above Labs/ tests ordered today include:see above  Disposition:   FU:  see above  Signed, Cecilie Kicks, NP  09/30/2019 11:37 AM    [facility] [address] [place], [place], [place], [place] Phone: [phone]; Fax: [phone]

## 2019-09-30 ENCOUNTER — Ambulatory Visit (INDEPENDENT_AMBULATORY_CARE_PROVIDER_SITE_OTHER): Payer: Medicare (Managed Care) | Admitting: Cardiology

## 2019-09-30 ENCOUNTER — Other Ambulatory Visit: Payer: Self-pay

## 2019-09-30 ENCOUNTER — Encounter: Payer: Self-pay | Admitting: Cardiology

## 2019-09-30 VITALS — BP 110/68 | HR 60 | Ht 68.0 in | Wt 242.8 lb

## 2019-09-30 DIAGNOSIS — Z9989 Dependence on other enabling machines and devices: Secondary | ICD-10-CM

## 2019-09-30 DIAGNOSIS — D696 Thrombocytopenia, unspecified: Secondary | ICD-10-CM

## 2019-09-30 DIAGNOSIS — I4819 Other persistent atrial fibrillation: Secondary | ICD-10-CM

## 2019-09-30 DIAGNOSIS — R600 Localized edema: Secondary | ICD-10-CM

## 2019-09-30 DIAGNOSIS — I25700 Atherosclerosis of coronary artery bypass graft(s), unspecified, with unstable angina pectoris: Secondary | ICD-10-CM | POA: Diagnosis not present

## 2019-09-30 DIAGNOSIS — G4733 Obstructive sleep apnea (adult) (pediatric): Secondary | ICD-10-CM

## 2019-09-30 DIAGNOSIS — M10072 Idiopathic gout, left ankle and foot: Secondary | ICD-10-CM

## 2019-09-30 DIAGNOSIS — E119 Type 2 diabetes mellitus without complications: Secondary | ICD-10-CM

## 2019-09-30 DIAGNOSIS — Z79899 Other long term (current) drug therapy: Secondary | ICD-10-CM | POA: Diagnosis not present

## 2019-09-30 DIAGNOSIS — I5022 Chronic systolic (congestive) heart failure: Secondary | ICD-10-CM

## 2019-09-30 DIAGNOSIS — I10 Essential (primary) hypertension: Secondary | ICD-10-CM

## 2019-09-30 LAB — COMPREHENSIVE METABOLIC PANEL
ALT: 42 IU/L (ref 0–44)
AST: 25 IU/L (ref 0–40)
Albumin/Globulin Ratio: 1.9 (ref 1.2–2.2)
Albumin: 4.4 g/dL (ref 3.7–4.7)
Alkaline Phosphatase: 72 IU/L (ref 44–121)
BUN/Creatinine Ratio: 13 (ref 10–24)
BUN: 18 mg/dL (ref 8–27)
Bilirubin Total: 0.6 mg/dL (ref 0.0–1.2)
CO2: 27 mmol/L (ref 20–29)
Calcium: 9 mg/dL (ref 8.6–10.2)
Chloride: 101 mmol/L (ref 96–106)
Creatinine, Ser: 1.41 mg/dL — ABNORMAL HIGH (ref 0.76–1.27)
GFR calc Af Amer: 55 mL/min/{1.73_m2} — ABNORMAL LOW (ref >59)
GFR calc non Af Amer: 48 mL/min/{1.73_m2} — ABNORMAL LOW (ref >59)
Globulin, Total: 2.3 g/dL (ref 1.5–4.5)
Glucose: 102 mg/dL — ABNORMAL HIGH (ref 65–99)
Potassium: 4.2 mmol/L (ref 3.5–5.2)
Sodium: 141 mmol/L (ref 134–144)
Total Protein: 6.7 g/dL (ref 6.0–8.5)

## 2019-09-30 LAB — TSH: TSH: 2.99 u[IU]/mL (ref 0.450–4.500)

## 2019-09-30 MED ORDER — COLCHICINE 0.6 MG PO TABS: 0.6000 mg | ORAL_TABLET | Freq: Every day | ORAL | 1 refills | Status: DC | PRN

## 2019-09-30 MED ORDER — FUROSEMIDE 40 MG PO TABS: 40.0000 mg | ORAL_TABLET | Freq: Every day | ORAL | 3 refills | Status: DC

## 2019-09-30 MED ORDER — EZETIMIBE 10 MG PO TABS
10.0000 mg | ORAL_TABLET | Freq: Every day | ORAL | 1 refills | Status: DC
Start: 2019-09-30 — End: 2020-05-08

## 2019-09-30 MED ORDER — APIXABAN 5 MG PO TABS
5.0000 mg | ORAL_TABLET | Freq: Two times a day (BID) | ORAL | 3 refills | Status: DC
Start: 2019-09-30 — End: 2020-09-29

## 2019-09-30 MED ORDER — CLOPIDOGREL BISULFATE 75 MG PO TABS: 75.0000 mg | ORAL_TABLET | Freq: Every day | ORAL | 3 refills | Status: DC

## 2019-09-30 MED ORDER — SACUBITRIL-VALSARTAN 24-26 MG PO TABS: 1.0000 | ORAL_TABLET | Freq: Two times a day (BID) | ORAL | 3 refills | Status: DC

## 2019-09-30 MED ORDER — POTASSIUM CHLORIDE ER 10 MEQ PO TBCR: 20.0000 meq | EXTENDED_RELEASE_TABLET | Freq: Every day | ORAL | 1 refills | Status: DC | PRN

## 2019-09-30 MED ORDER — AMIODARONE HCL 200 MG PO TABS: 200.0000 mg | ORAL_TABLET | Freq: Every day | ORAL | 3 refills | Status: DC

## 2019-09-30 MED ORDER — CARVEDILOL 6.25 MG PO TABS: 6.2500 mg | ORAL_TABLET | Freq: Two times a day (BID) | ORAL | 3 refills | Status: DC

## 2019-09-30 NOTE — Patient Instructions (Addendum)
Medication Instructions:  Your physician has recommended you make the following change in your medication:  1.  REDUCE the Amiodarone to 1 tablet daily 2.  ON September 26, you can STOP Aspirin 3.  REDUCE the Colchicine to AS NEEDED 4.  INCREASE the Lasix to 40 mg taking 1 & 1/2 tablet for 2 days then go back to 1 tablet daily   *If you need a refill on your cardiac medications before your next appointment, please call your pharmacy*   Lab Work: TODAY:  CMET & TSH  If you have labs (blood work) drawn today and your tests are completely normal, you will receive your results only by: Marland Kitchen MyChart Message (if you have MyChart) OR . A paper copy in the mail If you have any lab test that is abnormal or we need to change your treatment, we will call you to review the results.   Testing/Procedures: None    Follow-Up: At [facility], you and your health needs are our priority.  As part of our continuing mission to provide you with exceptional heart care, we have created designated Provider Care Teams.  These Care Teams include your primary Cardiologist (physician) and Advanced Practice Providers (APPs -  Physician Assistants and Nurse Practitioners) who all work together to provide you with the care you need, when you need it.  We recommend signing up for the patient portal called "MyChart".  Sign up information is provided on this After Visit Summary.  MyChart is used to connect with patients for Virtual Visits (Telemedicine).  Patients are able to view lab/test results, encounter notes, upcoming appointments, etc.  Non-urgent messages can be sent to your provider as well.   To learn more about what you can do with MyChart, go to [email or web address].    Your next appointment:   2 week(s)  The format for your next appointment:   In Person  Provider:   You may see Sinclair Grooms, MD or one of the following Advanced Practice Providers on your designated Care Team:    Truitt Merle,  NP  Cecilie Kicks, NP  Kathyrn Drown, NP    Other Instructions

## 2019-10-01 ENCOUNTER — Other Ambulatory Visit: Payer: Self-pay | Admitting: Cardiology

## 2019-10-01 ENCOUNTER — Telehealth: Payer: Self-pay | Admitting: *Deleted

## 2019-10-01 DIAGNOSIS — Z79899 Other long term (current) drug therapy: Secondary | ICD-10-CM

## 2019-10-01 NOTE — Telephone Encounter (Signed)
-----   Message from Isaiah Serge, NP sent at 10/01/2019  8:50 AM EDT ----- Labs stable -kidney function a little stressed, may be due to volume overload but let's recheck Monday after he takes the extra lasix.  Thyroid is normal, checked since the amiodarone can affect thyroid. But no problems.

## 2019-10-04 ENCOUNTER — Other Ambulatory Visit: Payer: Self-pay

## 2019-10-04 ENCOUNTER — Telehealth: Payer: Self-pay | Admitting: Interventional Cardiology

## 2019-10-04 ENCOUNTER — Other Ambulatory Visit: Payer: Medicare (Managed Care) | Admitting: *Deleted

## 2019-10-04 DIAGNOSIS — Z79899 Other long term (current) drug therapy: Secondary | ICD-10-CM

## 2019-10-04 LAB — BASIC METABOLIC PANEL
BUN/Creatinine Ratio: 16 (ref 10–24)
BUN: 20 mg/dL (ref 8–27)
CO2: 23 mmol/L (ref 20–29)
Calcium: 9.2 mg/dL (ref 8.6–10.2)
Chloride: 105 mmol/L (ref 96–106)
Creatinine, Ser: 1.26 mg/dL (ref 0.76–1.27)
GFR calc Af Amer: 63 mL/min/{1.73_m2} (ref >59)
GFR calc non Af Amer: 54 mL/min/{1.73_m2} — ABNORMAL LOW (ref >59)
Glucose: 96 mg/dL (ref 65–99)
Potassium: 4.6 mmol/L (ref 3.5–5.2)
Sodium: 141 mmol/L (ref 134–144)

## 2019-10-04 NOTE — Telephone Encounter (Signed)
New message:      Thayer Headings calling from [facility] calling concering some medications for the patient please call back. Also they will be holding the order till they hear back from some one. Please call back.

## 2019-10-05 NOTE — Telephone Encounter (Signed)
Called [facility] to clarify pt's medications. Pharmacist verbalized understanding and sending pt's medications out to pt.

## 2019-10-06 ENCOUNTER — Encounter (HOSPITAL_COMMUNITY): Payer: Self-pay

## 2019-10-11 ENCOUNTER — Telehealth (HOSPITAL_COMMUNITY): Payer: Self-pay | Admitting: Pharmacist

## 2019-10-13 ENCOUNTER — Other Ambulatory Visit: Payer: Self-pay | Admitting: Family Medicine

## 2019-10-13 MED ORDER — ATORVASTATIN CALCIUM 80 MG PO TABS
80.0000 mg | ORAL_TABLET | Freq: Every day | ORAL | 1 refills | Status: DC
Start: 2019-10-13 — End: 2019-10-13

## 2019-10-13 MED ORDER — ATORVASTATIN CALCIUM 80 MG PO TABS
80.0000 mg | ORAL_TABLET | Freq: Every day | ORAL | 1 refills | Status: DC
Start: 2019-10-13 — End: 2020-05-23

## 2019-10-14 ENCOUNTER — Telehealth (HOSPITAL_COMMUNITY): Payer: Self-pay | Admitting: Pharmacist

## 2019-10-14 NOTE — Telephone Encounter (Signed)
Cardiac Rehab Medication Review by a Pharmacist  Does the patient  feel that his/her medications are working for him/her?  yes  Has the patient been experiencing any side effects to the medications prescribed?  Yes, has noticed fatigue. Noticed he gets tired more easily now.   Does the patient measure his/her own blood pressure or blood glucose at home?  Yes, BP runs at 120/70. Takes it 2-3 times per day   Does the patient have any problems obtaining medications due to transportation or finances?   no  Understanding of regimen: fair Understanding of indications: good Potential of compliance: excellent   Wilson Singer, PharmD PGY1 Pharmacy Resident 10/14/2019 3:37 PM

## 2019-10-19 ENCOUNTER — Encounter (HOSPITAL_COMMUNITY)
Admission: RE | Admit: 2019-10-19 | Discharge: 2019-10-19 | Disposition: A | Discharge status: Home / Self Care | Payer: Medicare (Managed Care) | Source: Ambulatory Visit | Attending: Interventional Cardiology | Admitting: Interventional Cardiology

## 2019-10-19 ENCOUNTER — Other Ambulatory Visit: Payer: Self-pay

## 2019-10-19 DIAGNOSIS — Z955 Presence of coronary angioplasty implant and graft: Secondary | ICD-10-CM

## 2019-10-19 DIAGNOSIS — I214 Non-ST elevation (NSTEMI) myocardial infarction: Secondary | ICD-10-CM

## 2019-10-19 NOTE — Progress Notes (Signed)
Cardiac Rehab Telephone Note:  Successful telephone encounter to Raymond Christensen to confirm Cardiac Rehab orientation appointment for 10/21/19 at 9:30 am. Nursing assessment completed. Patient questions answered. Instructions for appointment provided. Patient screening for Covid-19 negative.  Lem Peary E. Rollene Rotunda RN, BSN [place]. [facility]  Cardiac and Pulmonary Rehabilitation Phone: [phone] Fax: [phone]

## 2019-10-21 ENCOUNTER — Encounter (HOSPITAL_COMMUNITY): Payer: Self-pay

## 2019-10-21 ENCOUNTER — Encounter (HOSPITAL_COMMUNITY)
Admission: RE | Admit: 2019-10-21 | Discharge: 2019-10-21 | Disposition: A | Discharge status: Home / Self Care | Payer: Medicare (Managed Care) | Source: Ambulatory Visit | Attending: Interventional Cardiology | Admitting: Interventional Cardiology

## 2019-10-21 ENCOUNTER — Other Ambulatory Visit: Payer: Self-pay

## 2019-10-21 VITALS — BP 114/78 | HR 66 | Ht 66.75 in | Wt 245.8 lb

## 2019-10-21 DIAGNOSIS — I214 Non-ST elevation (NSTEMI) myocardial infarction: Secondary | ICD-10-CM

## 2019-10-21 DIAGNOSIS — Z955 Presence of coronary angioplasty implant and graft: Secondary | ICD-10-CM

## 2019-10-21 NOTE — H&P (View-Only) (Signed)
Cardiology Office Note:    Date:  10/22/2019   ID:  Estuardo Frisbee, DOB [birth year], MRN [id number]  PCP:  [name], DO  Cardiologist:  Sinclair Grooms, MD   Referring MD: [name]*   Chief Complaint  Patient presents with  . Coronary Artery Disease  . Congestive Heart Failure  . Cardiac Valve Problem    Mitral regurgitation  . Atrial Fibrillation    History of Present Illness:    Alvaro Aungst is a 79 y.o. male with a hx of new AFib, elevated troponin and new reduced Ef 35-40% 08/2019, and mod to severe MR, LA thrombus on TEE. Recent PCI with stent SVG to Cfx. Past history of MI in 1994 c/b cardiac arrest s/p lytics and PTCA, CABG in 2001 (LIMA to LAD, SVG to OM-2, SVG to right PDA, SVG to D-2) with post-op AF, carotid stenosis s/p L CEA and right subclavian stenosis, diet-controlled DM, HTN, obesity, OSA on CPAP, mild cognitive impairment, and depression/mood disorder.  Has been managed by Dr. Sharlet Salina in the cardiovascular division at [facility].  Since discharge, he has seen Dr. Sharlet Salina who did not change any management plans.  The atrial fibrillation identified on admission to the hospital in August was of unknown duration.  He had mild heart failure and elevated troponin.  We found he had bypass graft failure and a stent was placed in the graft to the circumflex.  Atrial fibrillation was then managed by anticoagulation with subsequent plan for TEE cardioversion however he was demonstrated to have a left atrial thrombus.  He has been on continuous anticoagulation since September 1.  He has not missed any doses.  He feels exertional fatigue.  He does not have orthopnea or PND.  He is unaware of any palpitations.  He is tolerating amiodarone 200 mg daily without difficulty.  He is on Eliquis 5 mg twice daily without bleeding.  Also on Plavix because of recent stent implantation.  He does have mild lower extremity swelling.  He does  feel he is having increased difficulty with depression because of the stress of the illness and concern about how this will work its way out.  Past Medical History:  Diagnosis Date  . Arthritis   . CAD (coronary artery disease)   . Concussion Summer 2012  . Essential hypertension 02/26/2017  . Heart disease   . Hyperlipidemia   . Hypothyroidism 10/15/2017  . Major depressive disorder   . Obstructive sleep apnea on CPAP   . Poor flexibility of tendon 12/01/2018  . Subungual hematoma of digit of hand 12/01/2018  . Subungual hematoma of right foot 12/01/2018  . Thyroid disease   . Type 2 diabetes mellitus without complication, without long-term current use of insulin ([place]) 02/26/2017    Past Surgical History:  Procedure Laterality Date  . BYPASS GRAFT  2001  . CARDIAC CATHETERIZATION    . CAROTID ARTERY ANGIOPLASTY    . RIGHT/LEFT HEART CATH AND CORONARY/GRAFT ANGIOGRAPHY N/A 09/09/2019   Procedure: RIGHT/LEFT HEART CATH AND CORONARY/GRAFT ANGIOGRAPHY;  Surgeon: [name], MD;  Location: [facility];  Service: Cardiovascular;  Laterality: N/A;  . TEE WITHOUT CARDIOVERSION N/A 09/13/2019   Procedure: TRANSESOPHAGEAL ECHOCARDIOGRAM (TEE);  Surgeon: [name], MD;  Location: [facility] ENDOSCOPY;  Service: Cardiovascular;  Laterality: N/A;  . TOTAL HIP ARTHROPLASTY  1994, 1995    Current Medications: Current Meds  Medication Sig  . amiodarone (PACERONE) 200 MG tablet Take 1 tablet (200  mg total) by mouth daily.  Marland Kitchen apixaban (ELIQUIS) 5 MG TABS tablet Take 1 tablet (5 mg total) by mouth 2 (two) times daily.  Marland Kitchen atorvastatin (LIPITOR) 80 MG tablet Take 1 tablet (80 mg total) by mouth daily.  . busPIRone (BUSPAR) 7.5 MG tablet Take 7.5 mg by mouth 2 (two) times daily.  . carvedilol (COREG) 6.25 MG tablet Take 1 tablet (6.25 mg total) by mouth 2 (two) times daily with a meal.  . Cholecalciferol (VITAMIN D3) 10000 units TABS Take 10,000 Units by mouth daily.   . clopidogrel  (PLAVIX) 75 MG tablet Take 1 tablet (75 mg total) by mouth daily with breakfast.  . colchicine 0.6 MG tablet Take 1 tablet (0.6 mg total) by mouth daily as needed.  . Cyanocobalamin (VITAMIN B-12) 5000 MCG LOZG Take 1,000 mcg by mouth daily.   Marland Kitchen ezetimibe (ZETIA) 10 MG tablet Take 1 tablet (10 mg total) by mouth daily.  . famotidine (PEPCID) 20 MG tablet Take 1 tablet (20 mg total) by mouth daily.  . finasteride (PROSCAR) 5 MG tablet TAKE 1 TABLET DAILY (Patient taking differently: Take 5 mg by mouth daily. )  . fluticasone (FLONASE) 50 MCG/ACT nasal spray Place 2 sprays into both nostrils daily as needed for allergies or rhinitis.  . furosemide (LASIX) 40 MG tablet Take 1 tablet (40 mg total) by mouth daily.  Marland Kitchen levocetirizine (XYZAL) 5 MG tablet TAKE 1 TABLET EVERY EVENING (Patient taking differently: Take 5 mg by mouth every evening. )  . levothyroxine (SYNTHROID) 75 MCG tablet TAKE 1 TABLET DAILY (Patient taking differently: Take 75 mcg by mouth daily before breakfast. )  . liothyronine (CYTOMEL) 5 MCG tablet TAKE 2 TABLETS DAILY  . Multiple Vitamins-Minerals (MULTIVITAMIN ADULT EXTRA C PO) Take 1 tablet by mouth daily.   . nitroGLYCERIN (NITROSTAT) 0.4 MG SL tablet Place 1 tablet (0.4 mg total) under the tongue every 5 (five) minutes as needed for chest pain.  Marland Kitchen omega-3 acid ethyl esters (LOVAZA) 1 g capsule Take 2 capsules (2 g total) by mouth 2 (two) times daily.  . potassium chloride (KLOR-CON 10) 10 MEQ tablet Take 2 tablets (20 mEq total) by mouth daily as needed (with every 40mg  dose of Lasix).  . sacubitril-valsartan (ENTRESTO) 24-26 MG Take 1 tablet by mouth 2 (two) times daily.  Marland Kitchen spironolactone (ALDACTONE) 25 MG tablet Take 25 mg by mouth daily. Take 1/2 tablet (12.5mg ) by mouth daily  . tamsulosin (FLOMAX) 0.4 MG CAPS capsule TAKE 1 CAPSULE DAILY (Patient taking differently: Take 0.4 mg by mouth at bedtime. )     Allergies:   Sulfa antibiotics   Social History   Socioeconomic  History  . Marital status: Married    Spouse name: Not on file  . Number of children: Not on file  . Years of education: 56  . Highest education level: Doctorate  Occupational History  . Not on file  Tobacco Use  . Smoking status: Former Smoker    Types: Cigarettes  . Smokeless tobacco: Never Used  Vaping Use  . Vaping Use: Never used  Substance and Sexual Activity  . Alcohol use: Yes    Alcohol/week: 3.0 standard drinks    Types: 3 Standard drinks or equivalent per week    Comment: 1 drink 3 times per week  . Drug use: No  . Sexual activity: Yes  Other Topics Concern  . 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 Difficulty of Paying Living Expenses: Not hard at all  Food Insecurity:   . Worried About Charity fundraiser in the Last Year: Not on file  . Ran Out of Food in the Last Year: Not on file  Transportation Needs:   . Lack of Transportation (Medical): Not on file  . Lack of Transportation (Non-Medical): Not on file  Physical Activity:   . Days of Exercise per Week: Not on file  . Minutes of Exercise per Session: Not on file  Stress:   . Feeling of Stress : Not on file  Social Connections:   . Frequency of Communication with Friends and Family: Not on file  . Frequency of Social Gatherings with Friends and Family: Not on file  . Attends Religious Services: Not on file  . Active Member of Clubs or Organizations: Not on file  . Attends Archivist Meetings: Not on file  . Marital Status: Not on file     Family History: The patient's family history includes Dementia in his father. There is no history of Cancer.  ROS:   Please see the history of present illness.    He notices fatigue.  He has also been having dull frontal  headache.  Lower extremity edema has been a concern.  Depression as mentioned above.  He wonders if he can travel out of town.  I did answer that this would be okay.  All other systems reviewed and are negative.  EKGs/Labs/Other Studies Reviewed:    The following studies were reviewed today:  ECHOCARDIOGRAM 09/09/19: Intervention    Transesophageal echocardiogram September 13, 2019: IMPRESSIONS    1. Left ventricular ejection fraction, by estimation, is 40 to 45%. The  left ventricle has mildly decreased function. Left ventricular endocardial  border not optimally defined to evaluate regional wall motion.  2. Right ventricular systolic function is normal. The right ventricular  size is normal.  3. A left atrial/left atrial appendage thrombus was detected.  4. Moderate to severe regurgitation; severe by ERO and MR radius,  moderate by Rvol. Unable to pulse PV as he was not tolerating procedure  well at the time and expedited images were obtained. There appears to be a  ruptured chord and partial prolapsed  leaflet of A2. Taken together, highly suggestive of severe eccentric MR..  The mitral valve is degenerative. Moderate to severe mitral valve  regurgitation.  5. The aortic valve is tricuspid. Aortic valve regurgitation is mild. No  aortic stenosis is present.  6. There is Moderate (Grade III) plaque involving the descending aorta.   Conclusion(s)/Recommendation(s): Likely severe MR given measurements and  eccentric jet. There is thrombus of the left atrial appendage, so  cardioversion not attempted.     EKG:  EKG is not repeated.  He is clinically still in atrial fibrillation.  Rhythm strips from cardiac rehab as recently as 10/21/2019 demonstrate atrial fib with controlled ventricular response.  Recent Labs: 09/08/2019: B Natriuretic Peptide 264.7 09/10/2019: Hemoglobin 13.2; Platelets 129 09/11/2019: Magnesium 1.8 09/30/2019: ALT 42; TSH 2.990 10/04/2019: BUN 20;  Creatinine, Ser 1.26; Potassium 4.6; Sodium 141  Recent Lipid Panel    Component Value Date/Time   CHOL 135 09/08/2019 1254   TRIG 85 09/08/2019 1254   HDL  42 09/08/2019 1254   CHOLHDL 3.2 09/08/2019 1254   VLDL 17 09/08/2019 1254   LDLCALC 76 09/08/2019 1254   LDLDIRECT 76.0 06/22/2019 1503    Physical Exam:    VS:  BP 122/62   Pulse 67   Ht 5' 6.75" (1.695 m)   Wt 242 lb (109.8 kg)   SpO2 97%   BMI 38.19 kg/m     Wt Readings from Last 3 Encounters:  10/22/19 242 lb (109.8 kg)  10/21/19 245 lb 13 oz (111.5 kg)  09/30/19 242 lb 12.8 oz (110.1 kg)     GEN: Morbidly obese. No acute distress HEENT: Normal NECK: No JVD. LYMPHATICS: No lymphadenopathy CARDIAC: Irregularly irregular RR with high-pitched left lower sternal and apical systolic mitral regurgitation  murmur, gallop.  There is bilateral 1-2+ lower extremity edema. VASCULAR:  Normal Pulses. No bruits. RESPIRATORY:  Clear to auscultation without rales, wheezing or rhonchi  ABDOMEN: Soft, non-tender, non-distended, No pulsatile mass, MUSCULOSKELETAL: No deformity  SKIN: Warm and dry NEUROLOGIC:  Alert and oriented x 3 PSYCHIATRIC:  Normal affect   ASSESSMENT:    1. Persistent atrial fibrillation ([place])   2. Coronary artery disease involving coronary bypass graft of native heart with unstable angina pectoris ([place])   3. On amiodarone therapy   4. Essential hypertension   5. Chronic systolic heart failure ([place])   6. Type 2 diabetes mellitus without complication, without long-term current use of insulin ([facility])   7. Obstructive sleep apnea on CPAP   8. Educated about COVID-19 virus infection   9. Nonrheumatic mitral valve regurgitation    PLAN:    In order of problems listed above:  1. Persistent atrial fibrillation.  Unable to cardiovert while hospitalized because of left atrial thrombus.  Has been on uninterrupted anticoagulation therapy for greater than a month.  We will schedule elective electrical  cardioversion to be done later this month.  We will talk with all involved to ensure a smooth procedure. 2. Since stenting the circumflex graft, there has been no recurrence of angina.  We did discuss the patency rate of saphenous vein graft stenting.  We will surveille for any recurrent Ischemia/angina.  Secondary prevention discussed in detail. 3. Continue amiodarone 200 mg/day 4. Blood pressure control is excellent.  Continue carvedilol 6.25 mg twice daily Aldactone 12.5 mg daily, Entresto 24/26 mg twice daily. 5. Continue Entresto twice daily carvedilol twice daily and Aldactone.  Consider SGLT2 therapy. 6. Consider SGLT2 therapy 7. Compliant with CPAP. 8. COVID-19 vaccinated and will receive the booster. 9. We will plan a repeat transesophageal echocardiogram after sinus rhythm has been established if he maintains sinus rhythm.  He had moderate to severe mitral regurgitation in the setting of heart failure, lateral wall ischemia due to saphenous vein graft obstructive disease, and new prolonged atrial fibrillation.  Guideline directed therapy for left ventricular systolic dysfunction: Angiotensin receptor-neprilysin inhibitor (ARNI)-Entresto; beta-blocker therapy - carvedilol, metoprolol succinate, or bisoprolol; mineralocorticoid receptor antagonist (MRA) therapy -spironolactone or eplerenone.  SGLT-2 agents -  Dapagliflozin [place]) or Empagliflozin ([place]).These therapies have been shown to improve clinical outcomes including reduction of rehospitalization, survival, and acute heart failure.    Medication Adjustments/Labs and Tests Ordered: Current medicines are reviewed at length with the patient today.  Concerns regarding medicines are outlined above.  Orders Placed This Encounter  Procedures  . Basic metabolic panel  . CBC   No orders of the defined types were placed in this encounter.   Patient Instructions  Medication Instructions:  Your  physician recommends that you  continue on your current medications as directed. Please refer to the Current Medication list given to you today.  *If you need a refill on your cardiac medications before your next appointment, please call your pharmacy*   Lab Work: BMET and CBC within a week of the Cardioversion  If you have labs (blood work) drawn today and your tests are completely normal, you will receive your results only by: Marland Kitchen MyChart Message (if you have MyChart) OR . A paper copy in the mail If you have any lab test that is abnormal or we need to change your treatment, we will call you to review the results.   Testing/Procedures: Your physician has recommended that you have a Cardioversion (DCCV). Electrical Cardioversion uses a jolt of electricity to your heart either through paddles or wired patches attached to your chest. This is a controlled, usually prescheduled, procedure. Defibrillation is done under light anesthesia in the hospital, and you usually go home the day of the procedure. This is done to get your heart back into a normal rhythm. You are not awake for the procedure. Please see the instruction sheet given to you today.    Follow-Up: At [facility], you and your health needs are our priority.  As part of our continuing mission to provide you with exceptional heart care, we have created designated Provider Care Teams.  These Care Teams include your primary Cardiologist (physician) and Advanced Practice Providers (APPs -  Physician Assistants and Nurse Practitioners) who all work together to provide you with the care you need, when you need it.  We recommend signing up for the patient portal called "MyChart".  Sign up information is provided on this After Visit Summary.  MyChart is used to connect with patients for Virtual Visits (Telemedicine).  Patients are able to view lab/test results, encounter notes, upcoming appointments, etc.  Non-urgent messages can be sent to your provider as well.   To learn  more about what you can do with MyChart, go to [email or web address].    Your next appointment:   1 week(s) after cardioversion  The format for your next appointment:   In Person  Provider:   You may see Sinclair Grooms, MD or one of the following Advanced Practice Providers on your designated Care Team:    Truitt Merle, NP  Cecilie Kicks, NP  Kathyrn Drown, NP    Other Instructions  Due to recent COVID-19 restrictions implemented by our local and state authorities and in an effort to keep both patients and staff as safe as possible, our hospital system requires COVID-19 testing prior to certain scheduled hospital procedures.  Please go to [facility]. [place], [place] [address] on ____ at ____  .  This is a drive up testing site.  You will not need to exit your vehicle.  You will not be billed at the time of testing but may receive a bill later depending on your insurance. You must agree to self-quarantine from the time of your testing until the procedure date on 11/08/19.  This should included staying home with ONLY the people you live with.  Avoid take-out, grocery store shopping or leaving the house for any non-emergent reason.  Failure to have your COVID-19 test done on the date and time you have been scheduled will result in cancellation of your procedure.  Please call our office at [phone] if you have any questions.   Dear Mr. Aeon Kessner are scheduled for a  Cardioversion on Monday, November 08, 2019 with Dr. Johnsie Cancel.  Please arrive at the [facility] (Main Entrance A) at [facility]: [address] [place], [place] [address] at 10 am.  DIET: Nothing to eat or drink after midnight except a sip of water with medications (see medication instructions below)  Medication Instructions: Hold Furosemide and Potassium the morning of your procedure.   Labs: If patient is on Coumadin, patient needs pt/INR, CBC, BMET within 3 days (No pt/INR needed for patients taking  Xarelto, Eliquis, Pradaxa) For patients receiving anesthesia for TEE and all Cardioversion patients: BMET, CBC within 1 week  Come to: the lab at our office on  You can come anytime between 7:30AM-4:30PM.   You must have a responsible person to drive you home and stay in the waiting area during your procedure. Failure to do so could result in cancellation.  Bring your insurance cards.  *Special Note: Every effort is made to have your procedure done on time. Occasionally there are emergencies that occur at the hospital that may cause delays. Please be patient if a delay does occur.         Signed, Sinclair Grooms, MD  10/22/2019 12:57 PM    [place]

## 2019-10-21 NOTE — Progress Notes (Signed)
Cardiac Individual Treatment Plan  Patient Details  Name: Kyden Potash MRN: [id number] Date of Birth: 08/06/1940 Referring Provider:     Luis Llorens Torres from 10/21/2019 in [place]  Referring Provider Belva Crome, MD      Initial Encounter Date:    CARDIAC REHAB PHASE II ORIENTATION from 10/21/2019 in [place]  Date 10/21/19      Visit Diagnosis: NSTEMI (non-ST elevated myocardial infarction) ([place]) 09/09/19  S/P DES SVG to OM2 and PDA 09/09/19  Patient's Home Medications on Admission:  Current Outpatient Medications:  .  amiodarone (PACERONE) 200 MG tablet, Take 1 tablet (200 mg total) by mouth daily., Disp: 90 tablet, Rfl: 3 .  apixaban (ELIQUIS) 5 MG TABS tablet, Take 1 tablet (5 mg total) by mouth 2 (two) times daily., Disp: 180 tablet, Rfl: 3 .  aspirin 81 MG EC tablet, Take 1 tablet (81 mg total) by mouth at bedtime. Swallow whole. (Patient not taking: Reported on 10/14/2019), Disp: 30 tablet, Rfl: 0 .  atorvastatin (LIPITOR) 80 MG tablet, Take 1 tablet (80 mg total) by mouth daily., Disp: 90 tablet, Rfl: 1 .  busPIRone (BUSPAR) 7.5 MG tablet, Take 7.5 mg by mouth 2 (two) times daily., Disp: , Rfl:  .  carvedilol (COREG) 6.25 MG tablet, Take 1 tablet (6.25 mg total) by mouth 2 (two) times daily with a meal., Disp: 180 tablet, Rfl: 3 .  Cholecalciferol (VITAMIN D3) 10000 units TABS, Take 10,000 Units by mouth daily. , Disp: , Rfl:  .  clopidogrel (PLAVIX) 75 MG tablet, Take 1 tablet (75 mg total) by mouth daily with breakfast., Disp: 30 tablet, Rfl: 3 .  colchicine 0.6 MG tablet, Take 1 tablet (0.6 mg total) by mouth daily as needed., Disp: 90 tablet, Rfl: 1 .  Cyanocobalamin (VITAMIN B-12) 5000 MCG LOZG, Take 1,000 mcg by mouth daily. , Disp: , Rfl:  .  ezetimibe (ZETIA) 10 MG tablet, Take 1 tablet (10 mg total) by mouth daily., Disp: 90 tablet, Rfl: 1 .  famotidine (PEPCID) 20 MG tablet,  Take 1 tablet (20 mg total) by mouth daily., Disp: 30 tablet, Rfl: 3 .  finasteride (PROSCAR) 5 MG tablet, TAKE 1 TABLET DAILY (Patient taking differently: Take 5 mg by mouth daily. ), Disp: 90 tablet, Rfl: 1 .  fluticasone (FLONASE) 50 MCG/ACT nasal spray, Place 2 sprays into both nostrils daily as needed for allergies or rhinitis., Disp: , Rfl:  .  furosemide (LASIX) 40 MG tablet, Take 1 tablet (40 mg total) by mouth daily., Disp: 90 tablet, Rfl: 3 .  levocetirizine (XYZAL) 5 MG tablet, TAKE 1 TABLET EVERY EVENING (Patient taking differently: Take 5 mg by mouth every evening. ), Disp: 90 tablet, Rfl: 1 .  levothyroxine (SYNTHROID) 75 MCG tablet, TAKE 1 TABLET DAILY (Patient taking differently: Take 75 mcg by mouth daily before breakfast. ), Disp: 90 tablet, Rfl: 1 .  liothyronine (CYTOMEL) 5 MCG tablet, TAKE 2 TABLETS DAILY, Disp: 180 tablet, Rfl: 3 .  Multiple Vitamins-Minerals (MULTIVITAMIN ADULT EXTRA C PO), Take 1 tablet by mouth daily. , Disp: , Rfl:  .  nitroGLYCERIN (NITROSTAT) 0.4 MG SL tablet, Place 1 tablet (0.4 mg total) under the tongue every 5 (five) minutes as needed for chest pain., Disp: 30 tablet, Rfl: 12 .  omega-3 acid ethyl esters (LOVAZA) 1 g capsule, Take 2 capsules (2 g total) by mouth 2 (two) times daily., Disp: 360 capsule, Rfl: 2 .  potassium chloride (KLOR-CON 10) 10 MEQ tablet, Take 2 tablets (20 mEq total) by mouth daily as needed (with every 40mg  dose of Lasix)., Disp: 90 tablet, Rfl: 1 .  sacubitril-valsartan (ENTRESTO) 24-26 MG, Take 1 tablet by mouth 2 (two) times daily., Disp: 180 tablet, Rfl: 3 .  spironolactone (ALDACTONE) 25 MG tablet, Take 25 mg by mouth daily. Take 1/2 tablet (12.5mg ) by mouth daily, Disp: , Rfl:  .  tamsulosin (FLOMAX) 0.4 MG CAPS capsule, TAKE 1 CAPSULE DAILY (Patient taking differently: Take 0.4 mg by mouth at bedtime. ), Disp: 90 capsule, Rfl: 3  Past Medical History: Past Medical History:  Diagnosis Date  . Arthritis   . CAD (coronary  artery disease)   . Concussion Summer 2012  . Essential hypertension 02/26/2017  . Heart disease   . Hyperlipidemia   . Hypothyroidism 10/15/2017  . Major depressive disorder   . Obstructive sleep apnea on CPAP   . Poor flexibility of tendon 12/01/2018  . Subungual hematoma of digit of hand 12/01/2018  . Subungual hematoma of right foot 12/01/2018  . Thyroid disease   . Type 2 diabetes mellitus without complication, without long-term current use of insulin ([place]) 02/26/2017    Tobacco Use: Social History   Tobacco Use  Smoking Status Former Smoker  . Types: Cigarettes  Smokeless Tobacco Never Used    Labs: Recent Review Flowsheet Data    Labs for ITP Cardiac and Pulmonary Rehab Latest Ref Rng & Units 06/22/2019 09/08/2019 09/09/2019 09/09/2019 09/09/2019   Cholestrol 0 - 200 mg/dL 155 135 - - -   LDLCALC 0 - 99 mg/dL - 76 - - -   LDLDIRECT mg/dL 76.0 - - - -   HDL >40 mg/dL 39.80 42 - - -   Trlycerides <150 mg/dL 311.0(H) 85 - - -   Hemoglobin A1c 4.6 - 6.5 % 6.6(H) - - - -   PHART 7.35 - 7.45 - - - - 7.386   PCO2ART 32 - 48 mmHg - - - - 49.2(H)   HCO3 20.0 - 28.0 mmol/L - - 30.1(H) 29.9(H) 29.5(H)   TCO2 22 - 32 mmol/L - - 32 31 31   O2SAT % - - 70.0 71.0 100.0      Capillary Blood Glucose: Lab Results  Component Value Date   GLUCAP 123 (H) 09/09/2019   GLUCAP 134 (H) 09/08/2019     Exercise Target Goals: Exercise Program Goal: Individual exercise prescription set using results from initial 6 min walk test and THRR while considering  patient's activity barriers and safety.   Exercise Prescription Goal: Starting with aerobic activity 30 plus minutes a day, 3 days per week for initial exercise prescription. Provide home exercise prescription and guidelines that participant acknowledges understanding prior to discharge.  Activity Barriers & Risk Stratification:  Activity Barriers & Cardiac Risk Stratification - 10/21/19 1320      Activity Barriers & Cardiac Risk  Stratification   Activity Barriers Other (comment)    Comments --           6 Minute Walk:  6 Minute Walk    Row Name 10/21/19 1047         6 Minute Walk   Phase Initial     Distance 1380 feet     Walk Time 6 minutes     # of Rest Breaks 0     MPH 2.61     METS 2.33     RPE 11     Perceived Dyspnea  0     VO2 Peak 8.16     Symptoms No     Resting HR 66 bpm     Resting BP 114/78     Resting Oxygen Saturation  98 %     Exercise Oxygen Saturation  during 6 min walk 98 %     Max Ex. HR 136 bpm     Max Ex. BP 134/80     2 Minute Post BP 128/78            Oxygen Initial Assessment:   Oxygen Re-Evaluation:   Oxygen Discharge (Final Oxygen Re-Evaluation):   Initial Exercise Prescription:  Initial Exercise Prescription - 10/21/19 1300      Date of Initial Exercise RX and Referring Provider   Date 10/21/19    Referring Provider Belva Crome, MD    Expected Discharge Date 12/17/19      NuStep   Level 2    SPM 75    Minutes 30    METs 2      Prescription Details   Frequency (times per week) 3    Duration Progress to 30 minutes of continuous aerobic without signs/symptoms of physical distress      Intensity   THRR 40-80% of Max Heartrate 57-114    Ratings of Perceived Exertion 11-13    Perceived Dyspnea 0-4      Progression   Progression Continue progressive overload as per policy without signs/symptoms or physical distress.      Resistance Training   Training Prescription Yes    Weight 3 lbs    Reps 10-15           Perform Capillary Blood Glucose checks as needed.  Exercise Prescription Changes:   Exercise Comments:   Exercise Goals and Review:  Exercise Goals    Row Name 10/21/19 1321             Exercise Goals   Increase Physical Activity Yes       Intervention Provide advice, education, support and counseling about physical activity/exercise needs.;Develop an individualized exercise prescription for aerobic and resistive  training based on initial evaluation findings, risk stratification, comorbidities and participant's personal goals.       Expected Outcomes Short Term: Attend rehab on a regular basis to increase amount of physical activity.;Long Term: Add in home exercise to make exercise part of routine and to increase amount of physical activity.;Long Term: Exercising regularly at least 3-5 days a week.       Increase Strength and Stamina Yes       Intervention Provide advice, education, support and counseling about physical activity/exercise needs.;Develop an individualized exercise prescription for aerobic and resistive training based on initial evaluation findings, risk stratification, comorbidities and participant's personal goals.       Expected Outcomes Short Term: Increase workloads from initial exercise prescription for resistance, speed, and METs.;Short Term: Perform resistance training exercises routinely during rehab and add in resistance training at home;Long Term: Improve cardiorespiratory fitness, muscular endurance and strength as measured by increased METs and functional capacity (6MWT)       Able to understand and use rate of perceived exertion (RPE) scale Yes       Intervention Provide education and explanation on how to use RPE scale       Expected Outcomes Short Term: Able to use RPE daily in rehab to express subjective intensity level;Long Term:  Able to use RPE to guide intensity level when exercising independently  Knowledge and understanding of Target Heart Rate Range (THRR) Yes       Intervention Provide education and explanation of THRR including how the numbers were predicted and where they are located for reference       Expected Outcomes Short Term: Able to state/look up THRR;Short Term: Able to use daily as guideline for intensity in rehab;Long Term: Able to use THRR to govern intensity when exercising independently       Able to check pulse independently Yes       Intervention  Provide education and demonstration on how to check pulse in carotid and radial arteries.;Review the importance of being able to check your own pulse for safety during independent exercise       Expected Outcomes Short Term: Able to explain why pulse checking is important during independent exercise;Long Term: Able to check pulse independently and accurately       Understanding of Exercise Prescription Yes       Intervention Provide education, explanation, and written materials on patient's individual exercise prescription       Expected Outcomes Short Term: Able to explain program exercise prescription;Long Term: Able to explain home exercise prescription to exercise independently              Exercise Goals Re-Evaluation :    Discharge Exercise Prescription (Final Exercise Prescription Changes):   Nutrition:  Target Goals: Understanding of nutrition guidelines, daily intake of sodium 1500mg , cholesterol 200mg , calories 30% from fat and 7% or less from saturated fats, daily to have 5 or more servings of fruits and vegetables.  Biometrics:  Pre Biometrics - 10/21/19 0930      Pre Biometrics   Waist Circumference 48 inches    Hip Circumference 49.5 inches    Waist to Hip Ratio 0.97 %    Triceps Skinfold 15 mm    % Body Fat 36.1 %    Grip Strength 35 kg    Flexibility 0 in   Pt could not reach box   Single Leg Stand 2.37 seconds   High risk for fall           Nutrition Therapy Plan and Nutrition Goals:   Nutrition Assessments:   Nutrition Goals Re-Evaluation:   Nutrition Goals Discharge (Final Nutrition Goals Re-Evaluation):   Psychosocial: Target Goals: Acknowledge presence or absence of significant depression and/or stress, maximize coping skills, provide positive support system. Participant is able to verbalize types and ability to use techniques and skills needed for reducing stress and depression.  Initial Review & Psychosocial Screening:  Initial Psych  Review & Screening - 10/21/19 1430      Initial Review   Current issues with Current Depression      Family Dynamics   Good Support System? Yes   Jimmey has his wife for support   Comments Antoine says that he gets depressed at times.  Patient is not currently on an antidepressant.      Barriers   Psychosocial barriers to participate in program The patient should benefit from training in stress management and relaxation.      Screening Interventions   Interventions Encouraged to exercise;Provide feedback about the scores to participant;To provide support and resources with identified psychosocial needs    Expected Outcomes Short Term goal: Utilizing psychosocial counselor, staff and physician to assist with identification of specific Stressors or current issues interfering with healing process. Setting desired goal for each stressor or current issue identified.;Long Term Goal: Stressors or current issues are controlled or  eliminated.;Short Term goal: Identification and review with participant of any Quality of Life or Depression concerns found by scoring the questionnaire.;Long Term goal: The participant improves quality of Life and PHQ9 Scores as seen by post scores and/or verbalization of changes           Quality of Life Scores:  Quality of Life - 10/21/19 1310      Quality of Life   Select Quality of Life      Quality of Life Scores   Health/Function Pre 21.53 %    Socioeconomic Pre 25.36 %    Psych/Spiritual Pre 20.86 %    Family Pre 26.4 %    GLOBAL Pre 22.9 %          Scores of 19 and below usually indicate a poorer quality of life in these areas.  A difference of  2-3 points is a clinically meaningful difference.  A difference of 2-3 points in the total score of the Quality of Life Index has been associated with significant improvement in overall quality of life, self-image, physical symptoms, and general health in studies assessing change in quality of  life.  PHQ-9: Recent Review Flowsheet Data    Depression screen [facility] 2/9 10/21/2019 06/02/2019 02/04/2019 01/12/2018 03/18/2017   Decreased Interest 0 1 0 0 0   Down, Depressed, Hopeless 1 1 0 1 0   PHQ - 2 Score 1 2 0 1 0   Altered sleeping - 0 - - -   Tired, decreased energy - 1 - - -   Change in appetite - 0 - - -   Feeling bad or failure about yourself  - 0 - - -   Trouble concentrating - 0 - - -   Moving slowly or fidgety/restless - 0 - - -   Suicidal thoughts - 0 - - -   PHQ-9 Score - 3 - - -   Difficult doing work/chores - Somewhat difficult - - -     Interpretation of Total Score  Total Score Depression Severity:  1-4 = Minimal depression, 5-9 = Mild depression, 10-14 = Moderate depression, 15-19 = Moderately severe depression, 20-27 = Severe depression   Psychosocial Evaluation and Intervention:   Psychosocial Re-Evaluation:   Psychosocial Discharge (Final Psychosocial Re-Evaluation):   Vocational Rehabilitation: Provide vocational rehab assistance to qualifying candidates.   Vocational Rehab Evaluation & Intervention:  Vocational Rehab - 10/21/19 1435      Initial Vocational Rehab Evaluation & Intervention   Assessment shows need for Vocational Rehabilitation No           Education: Education Goals: Education classes will be provided on a weekly basis, covering required topics. Participant will state understanding/return demonstration of topics presented.  Learning Barriers/Preferences:  Learning Barriers/Preferences - 10/21/19 1311      Learning Barriers/Preferences   Learning Barriers Hearing   Bilateral hearing aids   Learning Preferences None           Education Topics: Hypertension, Hypertension Reduction -Define heart disease and high blood pressure. Discus how high blood pressure affects the body and ways to reduce high blood pressure.   Exercise and Your Heart -Discuss why it is important to exercise, the FITT principles of exercise, normal  and abnormal responses to exercise, and how to exercise safely.   Angina -Discuss definition of angina, causes of angina, treatment of angina, and how to decrease risk of having angina.   Cardiac Medications -Review what the following cardiac medications are used for, how they  affect the body, and side effects that may occur when taking the medications.  Medications include Aspirin, Beta blockers, calcium channel blockers, ACE Inhibitors, angiotensin receptor blockers, diuretics, digoxin, and antihyperlipidemics.   Congestive Heart Failure -Discuss the definition of CHF, how to live with CHF, the signs and symptoms of CHF, and how keep track of weight and sodium intake.   Heart Disease and Intimacy -Discus the effect sexual activity has on the heart, how changes occur during intimacy as we age, and safety during sexual activity.   Smoking Cessation / COPD -Discuss different methods to quit smoking, the health benefits of quitting smoking, and the definition of COPD.   Nutrition I: Fats -Discuss the types of cholesterol, what cholesterol does to the heart, and how cholesterol levels can be controlled.   Nutrition II: Labels -Discuss the different components of food labels and how to read food label   Heart Parts/Heart Disease and PAD -Discuss the anatomy of the heart, the pathway of blood circulation through the heart, and these are affected by heart disease.   Stress I: Signs and Symptoms -Discuss the causes of stress, how stress may lead to anxiety and depression, and ways to limit stress.   Stress II: Relaxation -Discuss different types of relaxation techniques to limit stress.   Warning Signs of Stroke / TIA -Discuss definition of a stroke, what the signs and symptoms are of a stroke, and how to identify when someone is having stroke.   Knowledge Questionnaire Score:  Knowledge Questionnaire Score - 10/21/19 1309      Knowledge Questionnaire Score   Pre Score  20/24           Core Components/Risk Factors/Patient Goals at Admission:  Personal Goals and Risk Factors at Admission - 10/21/19 1312      Core Components/Risk Factors/Patient Goals on Admission    Weight Management Yes;Obesity;Weight Loss    Intervention Weight Management: Develop a combined nutrition and exercise program designed to reach desired caloric intake, while maintaining appropriate intake of nutrient and fiber, sodium and fats, and appropriate energy expenditure required for the weight goal.;Weight Management: Provide education and appropriate resources to help participant work on and attain dietary goals.;Weight Management/Obesity: Establish reasonable short term and long term weight goals.;Obesity: Provide education and appropriate resources to help participant work on and attain dietary goals.    Admit Weight 245 lb 13 oz (111.5 kg)    Expected Outcomes Short Term: Continue to assess and modify interventions until short term weight is achieved;Long Term: Adherence to nutrition and physical activity/exercise program aimed toward attainment of established weight goal;Weight Maintenance: Understanding of the daily nutrition guidelines, which includes 25-35% calories from fat, 7% or less cal from saturated fats, less than 200mg  cholesterol, less than 1.5gm of sodium, & 5 or more servings of fruits and vegetables daily;Weight Loss: Understanding of general recommendations for a balanced deficit meal plan, which promotes 1-2 lb weight loss per week and includes a negative energy balance of [phone] kcal/d;Understanding recommendations for meals to include 15-35% energy as protein, 25-35% energy from fat, 35-60% energy from carbohydrates, less than 200mg  of dietary cholesterol, 20-35 gm of total fiber daily;Understanding of distribution of calorie intake throughout the day with the consumption of 4-5 meals/snacks    Diabetes Yes    Intervention Provide education about signs/symptoms and  action to take for hypo/hyperglycemia.;Provide education about proper nutrition, including hydration, and aerobic/resistive exercise prescription along with prescribed medications to achieve blood glucose in normal ranges: Fasting glucose 65-99 mg/dL  Expected Outcomes Short Term: Participant verbalizes understanding of the signs/symptoms and immediate care of hyper/hypoglycemia, proper foot care and importance of medication, aerobic/resistive exercise and nutrition plan for blood glucose control.;Long Term: Attainment of HbA1C < 7%.    Hypertension Yes    Intervention Monitor prescription use compliance.;Provide education on lifestyle modifcations including regular physical activity/exercise, weight management, moderate sodium restriction and increased consumption of fresh fruit, vegetables, and low fat dairy, alcohol moderation, and smoking cessation.    Expected Outcomes Short Term: Continued assessment and intervention until BP is < 140/31mm HG in hypertensive participants. < 130/40mm HG in hypertensive participants with diabetes, heart failure or chronic kidney disease.;Long Term: Maintenance of blood pressure at goal levels.    Lipids Yes    Intervention Provide education and support for participant on nutrition & aerobic/resistive exercise along with prescribed medications to achieve LDL 70mg , HDL >40mg .    Expected Outcomes Short Term: Participant states understanding of desired cholesterol values and is compliant with medications prescribed. Participant is following exercise prescription and nutrition guidelines.;Long Term: Cholesterol controlled with medications as prescribed, with individualized exercise RX and with personalized nutrition plan. Value goals: LDL < 70mg , HDL > 40 mg.           Core Components/Risk Factors/Patient Goals Review:    Core Components/Risk Factors/Patient Goals at Discharge (Final Review):    ITP Comments:  ITP Comments    Row Name 10/21/19 1359 10/21/19  1424         ITP Comments Dr Fransico Him MD, Medical Director Dr Fransico Him MD, Medical Director             Comments:Edwyn attended orientation on 10/21/2019 to review rules and guidelines for program.  Completed 6 minute walk test, Intitial ITP, and exercise prescription.  VSS. Telemetry-atrial fibrillation this has been previously documented.  Asymptomatic. Safety measures and social distancing in place per CDC guidelines. Cecilie Kicks FNP called and notified about continuing atrial Fib. Mickel Baas said that the patient is okay to proceed with exercise on Monday. Patient was given a copy of his ECG tracing and walk test sheet for Dr Tamala Julian to review at this appointment tomorrow.Barnet Pall, RN,BSN 10/21/2019 2:43 PM

## 2019-10-21 NOTE — Progress Notes (Signed)
Cardiology Office Note:    Date:  10/22/2019   ID:  Raymond Christensen, DOB 02-20-1940, MRN [id number]  PCP:  Raymond Pal, DO  Cardiologist:  Raymond Grooms, MD   Referring MD: Raymond Christensen*   Chief Complaint  Patient presents with  . Coronary Artery Disease  . Congestive Heart Failure  . Cardiac Valve Problem    Mitral regurgitation  . Atrial Fibrillation    History of Present Illness:    Raymond Christensen is a 79 y.o. male with a hx of new AFib, elevated troponin and new reduced Ef 35-40% 08/2019, and mod to severe MR, LA thrombus on TEE. Recent PCI with stent SVG to Cfx. Past history of MI in 1994 c/b cardiac arrest s/p lytics and PTCA, CABG in 2001 (LIMA to LAD, SVG to OM-2, SVG to right PDA, SVG to D-2) with post-op AF, carotid stenosis s/p L CEA and right subclavian stenosis, diet-controlled DM, HTN, obesity, OSA on CPAP, mild cognitive impairment, and depression/mood disorder.  Has been managed by Dr. Sharlet Christensen in the cardiovascular division at [facility].  Since discharge, he has seen Dr. Sharlet Christensen who did not change any management plans.  The atrial fibrillation identified on admission to the hospital in August was of unknown duration.  He had mild heart failure and elevated troponin.  We found he had bypass graft failure and a stent was placed in the graft to the circumflex.  Atrial fibrillation was then managed by anticoagulation with subsequent plan for TEE cardioversion however he was demonstrated to have a left atrial thrombus.  He has been on continuous anticoagulation since September 1.  He has not missed any doses.  He feels exertional fatigue.  He does not have orthopnea or PND.  He is unaware of any palpitations.  He is tolerating amiodarone 200 mg daily without difficulty.  He is on Eliquis 5 mg twice daily without bleeding.  Also on Plavix because of recent stent implantation.  He does have mild lower extremity swelling.  He does  feel he is having increased difficulty with depression because of the stress of the illness and concern about how this will work its way out.  Past Medical History:  Diagnosis Date  . Arthritis   . CAD (coronary artery disease)   . Concussion Summer 2012  . Essential hypertension 02/26/2017  . Heart disease   . Hyperlipidemia   . Hypothyroidism 10/15/2017  . Major depressive disorder   . Obstructive sleep apnea on CPAP   . Poor flexibility of tendon 12/01/2018  . Subungual hematoma of digit of hand 12/01/2018  . Subungual hematoma of right foot 12/01/2018  . Thyroid disease   . Type 2 diabetes mellitus without complication, without long-term current use of insulin ([place]) 02/26/2017    Past Surgical History:  Procedure Laterality Date  . BYPASS GRAFT  2001  . CARDIAC CATHETERIZATION    . CAROTID ARTERY ANGIOPLASTY    . RIGHT/LEFT HEART CATH AND CORONARY/GRAFT ANGIOGRAPHY N/A 09/09/2019   Procedure: RIGHT/LEFT HEART CATH AND CORONARY/GRAFT ANGIOGRAPHY;  Surgeon: [name], MD;  Location: [place] [facility];  Service: Cardiovascular;  Laterality: N/A;  . TEE WITHOUT CARDIOVERSION N/A 09/13/2019   Procedure: TRANSESOPHAGEAL ECHOCARDIOGRAM (TEE);  Surgeon: [name], MD;  Location: [facility] ENDOSCOPY;  Service: Cardiovascular;  Laterality: N/A;  . TOTAL HIP ARTHROPLASTY  1994, 1995    Current Medications: Current Meds  Medication Sig  . amiodarone (PACERONE) 200 MG tablet Take 1 tablet (200  mg total) by mouth daily.  Marland Kitchen apixaban (ELIQUIS) 5 MG TABS tablet Take 1 tablet (5 mg total) by mouth 2 (two) times daily.  Marland Kitchen atorvastatin (LIPITOR) 80 MG tablet Take 1 tablet (80 mg total) by mouth daily.  . busPIRone (BUSPAR) 7.5 MG tablet Take 7.5 mg by mouth 2 (two) times daily.  . carvedilol (COREG) 6.25 MG tablet Take 1 tablet (6.25 mg total) by mouth 2 (two) times daily with a meal.  . Cholecalciferol (VITAMIN D3) 10000 units TABS Take 10,000 Units by mouth daily.   . clopidogrel  (PLAVIX) 75 MG tablet Take 1 tablet (75 mg total) by mouth daily with breakfast.  . colchicine 0.6 MG tablet Take 1 tablet (0.6 mg total) by mouth daily as needed.  . Cyanocobalamin (VITAMIN B-12) 5000 MCG LOZG Take 1,000 mcg by mouth daily.   Marland Kitchen ezetimibe (ZETIA) 10 MG tablet Take 1 tablet (10 mg total) by mouth daily.  . famotidine (PEPCID) 20 MG tablet Take 1 tablet (20 mg total) by mouth daily.  . finasteride (PROSCAR) 5 MG tablet TAKE 1 TABLET DAILY (Patient taking differently: Take 5 mg by mouth daily. )  . fluticasone (FLONASE) 50 MCG/ACT nasal spray Place 2 sprays into both nostrils daily as needed for allergies or rhinitis.  . furosemide (LASIX) 40 MG tablet Take 1 tablet (40 mg total) by mouth daily.  Marland Kitchen levocetirizine (XYZAL) 5 MG tablet TAKE 1 TABLET EVERY EVENING (Patient taking differently: Take 5 mg by mouth every evening. )  . levothyroxine (SYNTHROID) 75 MCG tablet TAKE 1 TABLET DAILY (Patient taking differently: Take 75 mcg by mouth daily before breakfast. )  . liothyronine (CYTOMEL) 5 MCG tablet TAKE 2 TABLETS DAILY  . Multiple Vitamins-Minerals (MULTIVITAMIN ADULT EXTRA C PO) Take 1 tablet by mouth daily.   . nitroGLYCERIN (NITROSTAT) 0.4 MG SL tablet Place 1 tablet (0.4 mg total) under the tongue every 5 (five) minutes as needed for chest pain.  Marland Kitchen omega-3 acid ethyl esters (LOVAZA) 1 g capsule Take 2 capsules (2 g total) by mouth 2 (two) times daily.  . potassium chloride (KLOR-CON 10) 10 MEQ tablet Take 2 tablets (20 mEq total) by mouth daily as needed (with every 40mg  dose of Lasix).  . sacubitril-valsartan (ENTRESTO) 24-26 MG Take 1 tablet by mouth 2 (two) times daily.  Marland Kitchen spironolactone (ALDACTONE) 25 MG tablet Take 25 mg by mouth daily. Take 1/2 tablet (12.5mg ) by mouth daily  . tamsulosin (FLOMAX) 0.4 MG CAPS capsule TAKE 1 CAPSULE DAILY (Patient taking differently: Take 0.4 mg by mouth at bedtime. )     Allergies:   Sulfa antibiotics   Social History   Socioeconomic  History  . Marital status: Married    Spouse name: Not on file  . Number of children: Not on file  . Years of education: 29  . Highest education level: Doctorate  Occupational History  . Not on file  Tobacco Use  . Smoking status: Former Smoker    Types: Cigarettes  . Smokeless tobacco: Never Used  Vaping Use  . Vaping Use: Never used  Substance and Sexual Activity  . Alcohol use: Yes    Alcohol/week: 3.0 standard drinks    Types: 3 Standard drinks or equivalent per week    Comment: 1 drink 3 times per week  . Drug use: No  . Sexual activity: Yes  Other Topics Concern  . 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 Difficulty of Paying Living Expenses: Not hard at all  Food Insecurity:   . Worried About Charity fundraiser in the Last Year: Not on file  . Ran Out of Food in the Last Year: Not on file  Transportation Needs:   . Lack of Transportation (Medical): Not on file  . Lack of Transportation (Non-Medical): Not on file  Physical Activity:   . Days of Exercise per Week: Not on file  . Minutes of Exercise per Session: Not on file  Stress:   . Feeling of Stress : Not on file  Social Connections:   . Frequency of Communication with Friends and Family: Not on file  . Frequency of Social Gatherings with Friends and Family: Not on file  . Attends Religious Services: Not on file  . Active Member of Clubs or Organizations: Not on file  . Attends Archivist Meetings: Not on file  . Marital Status: Not on file     Family History: The patient's family history includes Dementia in his father. There is no history of Cancer.  ROS:   Please see the history of present illness.    He notices fatigue.  He has also been having dull frontal  headache.  Lower extremity edema has been a concern.  Depression as mentioned above.  He wonders if he can travel out of town.  I did answer that this would be okay.  All other systems reviewed and are negative.  EKGs/Labs/Other Studies Reviewed:    The following studies were reviewed today:  ECHOCARDIOGRAM 09/09/19: Intervention    Transesophageal echocardiogram September 13, 2019: IMPRESSIONS    1. Left ventricular ejection fraction, by estimation, is 40 to 45%. The  left ventricle has mildly decreased function. Left ventricular endocardial  border not optimally defined to evaluate regional wall motion.  2. Right ventricular systolic function is normal. The right ventricular  size is normal.  3. A left atrial/left atrial appendage thrombus was detected.  4. Moderate to severe regurgitation; severe by ERO and MR radius,  moderate by Rvol. Unable to pulse PV as he was not tolerating procedure  well at the time and expedited images were obtained. There appears to be a  ruptured chord and partial prolapsed  leaflet of A2. Taken together, highly suggestive of severe eccentric MR..  The mitral valve is degenerative. Moderate to severe mitral valve  regurgitation.  5. The aortic valve is tricuspid. Aortic valve regurgitation is mild. No  aortic stenosis is present.  6. There is Moderate (Grade III) plaque involving the descending aorta.   Conclusion(s)/Recommendation(s): Likely severe MR given measurements and  eccentric jet. There is thrombus of the left atrial appendage, so  cardioversion not attempted.     EKG:  EKG is not repeated.  He is clinically still in atrial fibrillation.  Rhythm strips from cardiac rehab as recently as 10/21/2019 demonstrate atrial fib with controlled ventricular response.  Recent Labs: 09/08/2019: B Natriuretic Peptide 264.7 09/10/2019: Hemoglobin 13.2; Platelets 129 09/11/2019: Magnesium 1.8 09/30/2019: ALT 42; TSH 2.990 10/04/2019: BUN 20;  Creatinine, Ser 1.26; Potassium 4.6; Sodium 141  Recent Lipid Panel    Component Value Date/Time   CHOL 135 09/08/2019 1254   TRIG 85 09/08/2019 1254   HDL  42 09/08/2019 1254   CHOLHDL 3.2 09/08/2019 1254   VLDL 17 09/08/2019 1254   LDLCALC 76 09/08/2019 1254   LDLDIRECT 76.0 06/22/2019 1503    Physical Exam:    VS:  BP 122/62   Pulse 67   Ht 5' 6.75" (1.695 m)   Wt 242 lb (109.8 kg)   SpO2 97%   BMI 38.19 kg/m     Wt Readings from Last 3 Encounters:  10/22/19 242 lb (109.8 kg)  10/21/19 245 lb 13 oz (111.5 kg)  09/30/19 242 lb 12.8 oz (110.1 kg)     GEN: Morbidly obese. No acute distress HEENT: Normal NECK: No JVD. LYMPHATICS: No lymphadenopathy CARDIAC: Irregularly irregular RR with high-pitched left lower sternal and apical systolic mitral regurgitation  murmur, gallop.  There is bilateral 1-2+ lower extremity edema. VASCULAR:  Normal Pulses. No bruits. RESPIRATORY:  Clear to auscultation without rales, wheezing or rhonchi  ABDOMEN: Soft, non-tender, non-distended, No pulsatile mass, MUSCULOSKELETAL: No deformity  SKIN: Warm and dry NEUROLOGIC:  Alert and oriented x 3 PSYCHIATRIC:  Normal affect   ASSESSMENT:    1. Persistent atrial fibrillation ([place])   2. Coronary artery disease involving coronary bypass graft of native heart with unstable angina pectoris ([place])   3. On amiodarone therapy   4. Essential hypertension   5. Chronic systolic heart failure ([place])   6. Type 2 diabetes mellitus without complication, without long-term current use of insulin ([facility])   7. Obstructive sleep apnea on CPAP   8. Educated about COVID-19 virus infection   9. Nonrheumatic mitral valve regurgitation    PLAN:    In order of problems listed above:  1. Persistent atrial fibrillation.  Unable to cardiovert while hospitalized because of left atrial thrombus.  Has been on uninterrupted anticoagulation therapy for greater than a month.  We will schedule elective electrical  cardioversion to be done later this month.  We will talk with all involved to ensure a smooth procedure. 2. Since stenting the circumflex graft, there has been no recurrence of angina.  We did discuss the patency rate of saphenous vein graft stenting.  We will surveille for any recurrent Ischemia/angina.  Secondary prevention discussed in detail. 3. Continue amiodarone 200 mg/day 4. Blood pressure control is excellent.  Continue carvedilol 6.25 mg twice daily Aldactone 12.5 mg daily, Entresto 24/26 mg twice daily. 5. Continue Entresto twice daily carvedilol twice daily and Aldactone.  Consider SGLT2 therapy. 6. Consider SGLT2 therapy 7. Compliant with CPAP. 8. COVID-19 vaccinated and will receive the booster. 9. We will plan a repeat transesophageal echocardiogram after sinus rhythm has been established if he maintains sinus rhythm.  He had moderate to severe mitral regurgitation in the setting of heart failure, lateral wall ischemia due to saphenous vein graft obstructive disease, and new prolonged atrial fibrillation.  Guideline directed therapy for left ventricular systolic dysfunction: Angiotensin receptor-neprilysin inhibitor (ARNI)-Entresto; beta-blocker therapy - carvedilol, metoprolol succinate, or bisoprolol; mineralocorticoid receptor antagonist (MRA) therapy -spironolactone or eplerenone.  SGLT-2 agents -  Dapagliflozin [place]) or Empagliflozin ([place]).These therapies have been shown to improve clinical outcomes including reduction of rehospitalization, survival, and acute heart failure.    Medication Adjustments/Labs and Tests Ordered: Current medicines are reviewed at length with the patient today.  Concerns regarding medicines are outlined above.  Orders Placed This Encounter  Procedures  . Basic metabolic panel  . CBC   No orders of the defined types were placed in this encounter.   Patient Instructions  Medication Instructions:  Your  physician recommends that you  continue on your current medications as directed. Please refer to the Current Medication list given to you today.  *If you need a refill on your cardiac medications before your next appointment, please call your pharmacy*   Lab Work: BMET and CBC within a week of the Cardioversion  If you have labs (blood work) drawn today and your tests are completely normal, you will receive your results only by: Marland Kitchen MyChart Message (if you have MyChart) OR . A paper copy in the mail If you have any lab test that is abnormal or we need to change your treatment, we will call you to review the results.   Testing/Procedures: Your physician has recommended that you have a Cardioversion (DCCV). Electrical Cardioversion uses a jolt of electricity to your heart either through paddles or wired patches attached to your chest. This is a controlled, usually prescheduled, procedure. Defibrillation is done under light anesthesia in the hospital, and you usually go home the day of the procedure. This is done to get your heart back into a normal rhythm. You are not awake for the procedure. Please see the instruction sheet given to you today.    Follow-Up: At [facility], you and your health needs are our priority.  As part of our continuing mission to provide you with exceptional heart care, we have created designated Provider Care Teams.  These Care Teams include your primary Cardiologist (physician) and Advanced Practice Providers (APPs -  Physician Assistants and Nurse Practitioners) who all work together to provide you with the care you need, when you need it.  We recommend signing up for the patient portal called "MyChart".  Sign up information is provided on this After Visit Summary.  MyChart is used to connect with patients for Virtual Visits (Telemedicine).  Patients are able to view lab/test results, encounter notes, upcoming appointments, etc.  Non-urgent messages can be sent to your provider as well.   To learn  more about what you can do with MyChart, go to [email or web address].    Your next appointment:   1 week(s) after cardioversion  The format for your next appointment:   In Person  Provider:   You may see Raymond Grooms, MD or one of the following Advanced Practice Providers on your designated Care Team:    Truitt Merle, NP  Cecilie Kicks, NP  Kathyrn Drown, NP    Other Instructions  Due to recent COVID-19 restrictions implemented by our local and state authorities and in an effort to keep both patients and staff as safe as possible, our hospital system requires COVID-19 testing prior to certain scheduled hospital procedures.  Please go to [address]. [place], [place] [address] on ____ at ____  .  This is a drive up testing site.  You will not need to exit your vehicle.  You will not be billed at the time of testing but may receive a bill later depending on your insurance. You must agree to self-quarantine from the time of your testing until the procedure date on 11/08/19.  This should included staying home with ONLY the people you live with.  Avoid take-out, grocery store shopping or leaving the house for any non-emergent reason.  Failure to have your COVID-19 test done on the date and time you have been scheduled will result in cancellation of your procedure.  Please call our office at [phone] if you have any questions.   Dear Mr. Zayvian Mcmurtry are scheduled for a  Cardioversion on Monday, November 08, 2019 with Dr. Johnsie Cancel.  Please arrive at the [facility] (Main Entrance A) at [facility]: [address] [place], [place] [address] at 10 am.  DIET: Nothing to eat or drink after midnight except a sip of water with medications (see medication instructions below)  Medication Instructions: Hold Furosemide and Potassium the morning of your procedure.   Labs: If patient is on Coumadin, patient needs pt/INR, CBC, BMET within 3 days (No pt/INR needed for patients taking  Xarelto, Eliquis, Pradaxa) For patients receiving anesthesia for TEE and all Cardioversion patients: BMET, CBC within 1 week  Come to: the lab at our office on  You can come anytime between 7:30AM-4:30PM.   You must have a responsible person to drive you home and stay in the waiting area during your procedure. Failure to do so could result in cancellation.  Bring your insurance cards.  *Special Note: Every effort is made to have your procedure done on time. Occasionally there are emergencies that occur at the hospital that may cause delays. Please be patient if a delay does occur.         Signed, Raymond Grooms, MD  10/22/2019 12:57 PM    [place]

## 2019-10-22 ENCOUNTER — Ambulatory Visit: Payer: Medicare (Managed Care) | Admitting: Interventional Cardiology

## 2019-10-22 ENCOUNTER — Encounter: Payer: Self-pay | Admitting: Interventional Cardiology

## 2019-10-22 VITALS — BP 122/62 | HR 67 | Ht 66.75 in | Wt 242.0 lb

## 2019-10-22 DIAGNOSIS — I4819 Other persistent atrial fibrillation: Secondary | ICD-10-CM | POA: Diagnosis not present

## 2019-10-22 DIAGNOSIS — I25700 Atherosclerosis of coronary artery bypass graft(s), unspecified, with unstable angina pectoris: Secondary | ICD-10-CM

## 2019-10-22 DIAGNOSIS — Z79899 Other long term (current) drug therapy: Secondary | ICD-10-CM

## 2019-10-22 DIAGNOSIS — I5022 Chronic systolic (congestive) heart failure: Secondary | ICD-10-CM

## 2019-10-22 DIAGNOSIS — Z9989 Dependence on other enabling machines and devices: Secondary | ICD-10-CM

## 2019-10-22 DIAGNOSIS — I10 Essential (primary) hypertension: Secondary | ICD-10-CM

## 2019-10-22 DIAGNOSIS — E119 Type 2 diabetes mellitus without complications: Secondary | ICD-10-CM

## 2019-10-22 DIAGNOSIS — I340 Nonrheumatic mitral (valve) insufficiency: Secondary | ICD-10-CM

## 2019-10-22 DIAGNOSIS — G4733 Obstructive sleep apnea (adult) (pediatric): Secondary | ICD-10-CM

## 2019-10-22 DIAGNOSIS — Z7189 Other specified counseling: Secondary | ICD-10-CM

## 2019-10-22 NOTE — Patient Instructions (Signed)
Medication Instructions:  Your physician recommends that you continue on your current medications as directed. Please refer to the Current Medication list given to you today.  *If you need a refill on your cardiac medications before your next appointment, please call your pharmacy*   Lab Work: BMET and CBC within a week of the Cardioversion  If you have labs (blood work) drawn today and your tests are completely normal, you will receive your results only by: Marland Kitchen MyChart Message (if you have MyChart) OR . A paper copy in the mail If you have any lab test that is abnormal or we need to change your treatment, we will call you to review the results.   Testing/Procedures: Your physician has recommended that you have a Cardioversion (DCCV). Electrical Cardioversion uses a jolt of electricity to your heart either through paddles or wired patches attached to your chest. This is a controlled, usually prescheduled, procedure. Defibrillation is done under light anesthesia in the hospital, and you usually go home the day of the procedure. This is done to get your heart back into a normal rhythm. You are not awake for the procedure. Please see the instruction sheet given to you today.    Follow-Up: At [facility], you and your health needs are our priority.  As part of our continuing mission to provide you with exceptional heart care, we have created designated Provider Care Teams.  These Care Teams include your primary Cardiologist (physician) and Advanced Practice Providers (APPs -  Physician Assistants and Nurse Practitioners) who all work together to provide you with the care you need, when you need it.  We recommend signing up for the patient portal called "MyChart".  Sign up information is provided on this After Visit Summary.  MyChart is used to connect with patients for Virtual Visits (Telemedicine).  Patients are able to view lab/test results, encounter notes, upcoming appointments, etc.   Non-urgent messages can be sent to your provider as well.   To learn more about what you can do with MyChart, go to [email or web address].    Your next appointment:   1 week(s) after cardioversion  The format for your next appointment:   In Person  Provider:   You may see Sinclair Grooms, MD or one of the following Advanced Practice Providers on your designated Care Team:    Truitt Merle, NP  Cecilie Kicks, NP  Kathyrn Drown, NP    Other Instructions  Due to recent COVID-19 restrictions implemented by our local and state authorities and in an effort to keep both patients and staff as safe as possible, our hospital system requires COVID-19 testing prior to certain scheduled hospital procedures.  Please go to [address]. [place], [place] [address] on ____ at ____  .  This is a drive up testing site.  You will not need to exit your vehicle.  You will not be billed at the time of testing but may receive a bill later depending on your insurance. You must agree to self-quarantine from the time of your testing until the procedure date on 11/08/19.  This should included staying home with ONLY the people you live with.  Avoid take-out, grocery store shopping or leaving the house for any non-emergent reason.  Failure to have your COVID-19 test done on the date and time you have been scheduled will result in cancellation of your procedure.  Please call our office at [phone] if you have any questions.   Dear Mr. [place] Raymond Christensen are  scheduled for a Cardioversion on Monday, November 08, 2019 with Dr. Johnsie Cancel.  Please arrive at the [facility] (Main Entrance A) at [facility]: [address] [place], [place] [address] at 10 am.  DIET: Nothing to eat or drink after midnight except a sip of water with medications (see medication instructions below)  Medication Instructions: Hold Furosemide and Potassium the morning of your procedure.   Labs: If patient is on Coumadin, patient needs  pt/INR, CBC, BMET within 3 days (No pt/INR needed for patients taking Xarelto, Eliquis, Pradaxa) For patients receiving anesthesia for TEE and all Cardioversion patients: BMET, CBC within 1 week  Come to: the lab at our office on  You can come anytime between 7:30AM-4:30PM.   You must have a responsible person to drive you home and stay in the waiting area during your procedure. Failure to do so could result in cancellation.  Bring your insurance cards.  *Special Note: Every effort is made to have your procedure done on time. Occasionally there are emergencies that occur at the hospital that may cause delays. Please be patient if a delay does occur.

## 2019-10-25 ENCOUNTER — Encounter (HOSPITAL_COMMUNITY)
Admission: RE | Admit: 2019-10-25 | Discharge: 2019-10-25 | Disposition: A | Discharge status: Home / Self Care | Payer: Medicare (Managed Care) | Source: Ambulatory Visit | Attending: Interventional Cardiology | Admitting: Interventional Cardiology

## 2019-10-25 ENCOUNTER — Telehealth: Payer: Self-pay

## 2019-10-25 ENCOUNTER — Other Ambulatory Visit: Payer: Self-pay

## 2019-10-25 DIAGNOSIS — Z955 Presence of coronary angioplasty implant and graft: Secondary | ICD-10-CM | POA: Diagnosis present

## 2019-10-25 DIAGNOSIS — I214 Non-ST elevation (NSTEMI) myocardial infarction: Secondary | ICD-10-CM

## 2019-10-25 NOTE — Progress Notes (Signed)
Daily Session Note  Patient Details  Name: Raymond Christensen MRN: [id number] Date of Birth: Dec 06, 1940 Referring Provider:     [place] from 10/21/2019 in [place]  Referring Provider Belva Crome, MD      Encounter Date: 10/25/2019  Check In:  Session Check In - 10/25/19 1120      Check-In   Supervising physician immediately available to respond to emergencies [facility] immediately available    Physician(s) Dr Rodena Piety    Staff Present Lesly Rubenstein, MS, EP-C, CCRP;Samad Thon, RN, Luisa Hart, RN, Deland Pretty, MS, ACSM CEP, Exercise Physiologist;Tyara Carol Ada, MS,ACSM CEP, Exercise Physiologist    Virtual Visit No    Medication changes reported     No    Fall or balance concerns reported    No    Tobacco Cessation No Change    Warm-up and Cool-down Performed on first and last piece of equipment    Resistance Training Performed Yes    VAD Patient? No    PAD/SET Patient? No      Pain Assessment   Currently in Pain? No/denies    Pain Score 0-No pain    Multiple Pain Sites No           Capillary Blood Glucose: No results found for this or any previous visit (from the past 24 hour(s)).   Exercise Prescription Changes - 10/25/19 1300      Response to Exercise   Blood Pressure (Admit) 112/62    Blood Pressure (Exercise) 118/72    Blood Pressure (Exit) 100/60    Heart Rate (Admit) 91 bpm    Heart Rate (Exercise) 93 bpm    Heart Rate (Exit) 75 bpm    Rating of Perceived Exertion (Exercise) 10    Perceived Dyspnea (Exercise) 0    Symptoms None    Comments Pt's first day of exercise    Duration Progress to 10 minutes continuous walking  at current work load and total walking time to 30-45 min    Intensity THRR unchanged      Progression   Progression Continue to progress workloads to maintain intensity without signs/symptoms of physical distress.    Average METs 2.5      Resistance  Training   Training Prescription Yes    Weight 3 lbs    Reps 10-15    Time 10 Minutes      Interval Training   Interval Training No      NuStep   Level 2    SPM 85    Minutes 30    METs 2.5           Social History   Tobacco Use  Smoking Status Former Smoker  . Types: Cigarettes  Smokeless Tobacco Never Used    Goals Met:  Exercise tolerated well No report of cardiac concerns or symptoms  Goals Unmet:  Not Applicable  Comments: Jemery started cardiac rehab today.  Pt tolerated light exercise without difficulty. VSS, telemetry-Atrial Fib, asymptomatic.  Medication list reconciled. Pt denies barriers to medicaiton compliance.  PSYCHOSOCIAL ASSESSMENT:  PHQ-1. Pt exhibits positive coping skills, hopeful outlook with supportive family. No psychosocial needs identified at this time, no psychosocial interventions necessary.    Pt enjoys reading and playing golf.   Pt oriented to exercise equipment and routine.    Understanding verbalized. Barnet Pall, RN,BSN 10/25/2019 4:20 PM   Dr. Fransico Him is Medical Director for Cardiac Rehab at [facility].

## 2019-10-25 NOTE — Telephone Encounter (Signed)
NOTES ON FILE FROM DR  Hill DO, SENT REFERRAL TO [place]

## 2019-10-27 ENCOUNTER — Encounter (HOSPITAL_COMMUNITY)
Admission: RE | Admit: 2019-10-27 | Discharge: 2019-10-27 | Disposition: A | Discharge status: Home / Self Care | Payer: Medicare (Managed Care) | Source: Ambulatory Visit | Attending: Interventional Cardiology | Admitting: Interventional Cardiology

## 2019-10-27 ENCOUNTER — Other Ambulatory Visit: Payer: Self-pay

## 2019-10-27 DIAGNOSIS — I214 Non-ST elevation (NSTEMI) myocardial infarction: Secondary | ICD-10-CM

## 2019-10-27 DIAGNOSIS — Z955 Presence of coronary angioplasty implant and graft: Secondary | ICD-10-CM

## 2019-10-29 ENCOUNTER — Other Ambulatory Visit: Payer: Self-pay

## 2019-10-29 ENCOUNTER — Encounter (HOSPITAL_COMMUNITY)
Admission: RE | Admit: 2019-10-29 | Discharge: 2019-10-29 | Disposition: A | Discharge status: Home / Self Care | Payer: Medicare (Managed Care) | Source: Ambulatory Visit | Attending: Interventional Cardiology | Admitting: Interventional Cardiology

## 2019-10-29 DIAGNOSIS — I214 Non-ST elevation (NSTEMI) myocardial infarction: Secondary | ICD-10-CM

## 2019-10-29 DIAGNOSIS — Z955 Presence of coronary angioplasty implant and graft: Secondary | ICD-10-CM

## 2019-11-01 ENCOUNTER — Other Ambulatory Visit: Payer: Medicare (Managed Care) | Admitting: *Deleted

## 2019-11-01 ENCOUNTER — Other Ambulatory Visit: Payer: Self-pay

## 2019-11-01 ENCOUNTER — Encounter (HOSPITAL_COMMUNITY)
Admission: RE | Admit: 2019-11-01 | Discharge: 2019-11-01 | Disposition: A | Discharge status: Home / Self Care | Payer: Medicare (Managed Care) | Source: Ambulatory Visit | Attending: Interventional Cardiology | Admitting: Interventional Cardiology

## 2019-11-01 VITALS — Wt 242.0 lb

## 2019-11-01 DIAGNOSIS — Z955 Presence of coronary angioplasty implant and graft: Secondary | ICD-10-CM

## 2019-11-01 DIAGNOSIS — I4819 Other persistent atrial fibrillation: Secondary | ICD-10-CM

## 2019-11-01 DIAGNOSIS — I214 Non-ST elevation (NSTEMI) myocardial infarction: Secondary | ICD-10-CM | POA: Diagnosis not present

## 2019-11-01 LAB — CBC
Hematocrit: 43.4 % (ref 37.5–51.0)
Hemoglobin: 14.3 g/dL (ref 13.0–17.7)
MCH: 29.4 pg (ref 26.6–33.0)
MCHC: 32.9 g/dL (ref 31.5–35.7)
MCV: 89 fL (ref 79–97)
Platelets: 143 10*3/uL — ABNORMAL LOW (ref 150–450)
RBC: 4.86 x10E6/uL (ref 4.14–5.80)
RDW: 13.5 % (ref 11.6–15.4)
WBC: 8.5 10*3/uL (ref 3.4–10.8)

## 2019-11-01 LAB — BASIC METABOLIC PANEL
BUN/Creatinine Ratio: 21 (ref 10–24)
BUN: 29 mg/dL — ABNORMAL HIGH (ref 8–27)
CO2: 26 mmol/L (ref 20–29)
Calcium: 9.4 mg/dL (ref 8.6–10.2)
Chloride: 104 mmol/L (ref 96–106)
Creatinine, Ser: 1.38 mg/dL — ABNORMAL HIGH (ref 0.76–1.27)
GFR calc Af Amer: 56 mL/min/{1.73_m2} — ABNORMAL LOW (ref >59)
GFR calc non Af Amer: 49 mL/min/{1.73_m2} — ABNORMAL LOW (ref >59)
Glucose: 108 mg/dL — ABNORMAL HIGH (ref 65–99)
Potassium: 4.8 mmol/L (ref 3.5–5.2)
Sodium: 142 mmol/L (ref 134–144)

## 2019-11-01 NOTE — Progress Notes (Signed)
Raymond Christensen 79 y.o. male Nutrition Note  Visit Diagnosis: NSTEMI (non-ST elevated myocardial infarction) ([place]) 09/09/19  S/P DES SVG to OM2 and PDA 09/09/19   Past Medical History:  Diagnosis Date  . Arthritis   . CAD (coronary artery disease)   . Concussion Summer 2012  . Essential hypertension 02/26/2017  . Heart disease   . Hyperlipidemia   . Hypothyroidism 10/15/2017  . Major depressive disorder   . Obstructive sleep apnea on CPAP   . Poor flexibility of tendon 12/01/2018  . Subungual hematoma of digit of hand 12/01/2018  . Subungual hematoma of right foot 12/01/2018  . Thyroid disease   . Type 2 diabetes mellitus without complication, without long-term current use of insulin ([place]) 02/26/2017     Medications reviewed.   Current Outpatient Medications:  .  amiodarone (PACERONE) 200 MG tablet, Take 1 tablet (200 mg total) by mouth daily., Disp: 90 tablet, Rfl: 3 .  apixaban (ELIQUIS) 5 MG TABS tablet, Take 1 tablet (5 mg total) by mouth 2 (two) times daily. (Patient taking differently: Take 5 mg by mouth daily. ), Disp: 180 tablet, Rfl: 3 .  aspirin 81 MG EC tablet, Take 1 tablet (81 mg total) by mouth at bedtime. Swallow whole. (Patient not taking: Reported on 10/22/2019), Disp: 30 tablet, Rfl: 0 .  atorvastatin (LIPITOR) 80 MG tablet, Take 1 tablet (80 mg total) by mouth daily., Disp: 90 tablet, Rfl: 1 .  carvedilol (COREG) 6.25 MG tablet, Take 1 tablet (6.25 mg total) by mouth 2 (two) times daily with a meal. (Patient taking differently: Take 6.25 mg by mouth daily. ), Disp: 180 tablet, Rfl: 3 .  Cholecalciferol (VITAMIN D3) 250 MCG (10000 UT) capsule, Take 10,000 Units by mouth daily. , Disp: , Rfl:  .  clopidogrel (PLAVIX) 75 MG tablet, Take 1 tablet (75 mg total) by mouth daily with breakfast., Disp: 30 tablet, Rfl: 3 .  colchicine 0.6 MG tablet, Take 1 tablet (0.6 mg total) by mouth daily as needed. (Patient taking differently: Take 0.6 mg by mouth daily as needed (Gout).  ), Disp: 90 tablet, Rfl: 1 .  Cyanocobalamin (VITAMIN B-12 PO), Take 1,000 mcg by mouth daily. , Disp: , Rfl:  .  ezetimibe (ZETIA) 10 MG tablet, Take 1 tablet (10 mg total) by mouth daily., Disp: 90 tablet, Rfl: 1 .  famotidine (PEPCID) 20 MG tablet, Take 1 tablet (20 mg total) by mouth daily., Disp: 30 tablet, Rfl: 3 .  finasteride (PROSCAR) 5 MG tablet, TAKE 1 TABLET DAILY (Patient taking differently: Take 5 mg by mouth daily. ), Disp: 90 tablet, Rfl: 1 .  furosemide (LASIX) 40 MG tablet, Take 1 tablet (40 mg total) by mouth daily., Disp: 90 tablet, Rfl: 3 .  levocetirizine (XYZAL) 5 MG tablet, TAKE 1 TABLET EVERY EVENING (Patient taking differently: Take 5 mg by mouth every evening. ), Disp: 90 tablet, Rfl: 1 .  levothyroxine (SYNTHROID) 75 MCG tablet, TAKE 1 TABLET DAILY (Patient taking differently: Take 75 mcg by mouth daily before breakfast. ), Disp: 90 tablet, Rfl: 1 .  liothyronine (CYTOMEL) 5 MCG tablet, TAKE 2 TABLETS DAILY (Patient taking differently: Take 10 mcg by mouth daily. ), Disp: 180 tablet, Rfl: 3 .  Multiple Vitamins-Minerals (MULTIVITAMIN ADULT EXTRA C PO), Take 1 tablet by mouth daily. , Disp: , Rfl:  .  nitroGLYCERIN (NITROSTAT) 0.4 MG SL tablet, Place 1 tablet (0.4 mg total) under the tongue every 5 (five) minutes as needed for chest pain., Disp: 30 tablet,  Rfl: 12 .  omega-3 acid ethyl esters (LOVAZA) 1 g capsule, Take 2 capsules (2 g total) by mouth 2 (two) times daily., Disp: 360 capsule, Rfl: 2 .  potassium chloride (KLOR-CON 10) 10 MEQ tablet, Take 2 tablets (20 mEq total) by mouth daily as needed (with every 40mg  dose of Lasix). (Patient taking differently: Take 20 mEq by mouth daily. ), Disp: 90 tablet, Rfl: 1 .  sacubitril-valsartan (ENTRESTO) 24-26 MG, Take 1 tablet by mouth 2 (two) times daily., Disp: 180 tablet, Rfl: 3 .  spironolactone (ALDACTONE) 25 MG tablet, Take 12.5 mg by mouth daily. , Disp: , Rfl:  .  tamsulosin (FLOMAX) 0.4 MG CAPS capsule, TAKE 1 CAPSULE  DAILY (Patient taking differently: Take 0.4 mg by mouth at bedtime. ), Disp: 90 capsule, Rfl: 3   Ht Readings from Last 1 Encounters:  10/22/19 5' 6.75" (1.695 m)     Wt Readings from Last 3 Encounters:  10/22/19 242 lb (109.8 kg)  10/21/19 245 lb 13 oz (111.5 kg)  09/30/19 242 lb 12.8 oz (110.1 kg)     There is no height or weight on file to calculate BMI.   Social History   Tobacco Use  Smoking Status Former Smoker  . Types: Cigarettes  Smokeless Tobacco Never Used     Lab Results  Component Value Date   CHOL 135 09/08/2019   Lab Results  Component Value Date   HDL 42 09/08/2019   Lab Results  Component Value Date   LDLCALC 76 09/08/2019   Lab Results  Component Value Date   TRIG 85 09/08/2019     Lab Results  Component Value Date   HGBA1C 6.6 (H) 06/22/2019     CBG (last 3)  No results for input(s): GLUCAP in the last 72 hours.   Nutrition Note  Spoke with pt. Nutrition Plan and Nutrition Survey goals reviewed with pt. Pt is following a Heart Healthy diet. Pt wants to lose wt. Currently interested in making changes but is unsure of how motivated he is right now. Pt has Type 2 Diabetes. Last A1c indicates blood glucose well-controlled.   Pt with dx of CHF. Per discussion, pt does use canned/convenience foods often. Pt does add salt to food when cooking but has quit using it at the table. Pt does not eat out frequently.  He eats 3 meals per day. He incorporates fruits and veggies most days. He feels his biggest problem is night time snacking. He feels motivated to change this. Behavior modification discussed. He feels choosing lower calorie snacks would be most effective for him. He does not feel like mindfulness will help right now. Pt expressed understanding of the information reviewed.  Nutrition Diagnosis ? Food-and nutrition-related knowledge deficit related to lack of exposure to information as related to diagnosis of: ? CVD ? Type 2  Diabetes ? Obese  II = 35-39.9 related to excessive energy intake as evidenced by a 38.19 kg/m2   Nutrition Intervention ? Pt's individual nutrition plan reviewed with pt. ? Benefits of adopting Heart Healthy diet discussed when Medficts reviewed.   ? Continue client-centered nutrition education by RD, as part of interdisciplinary care.  Goal(s) ? Pt to reduce night time snacking  ? Pt to identify food quantities necessary to achieve weight loss of 6-24 lb at graduation from cardiac rehab.  ? Pt to build a healthy plate including vegetables, fruits, whole grains, and low-fat dairy products in a heart healthy meal plan. Plan:   Will provide client-centered  nutrition education as part of interdisciplinary care  Monitor and evaluate progress toward nutrition goal with team.   Michaele Offer, MS, RDN, LDN

## 2019-11-03 ENCOUNTER — Other Ambulatory Visit: Payer: Self-pay | Admitting: Family Medicine

## 2019-11-03 ENCOUNTER — Encounter (HOSPITAL_COMMUNITY)
Admission: RE | Admit: 2019-11-03 | Discharge: 2019-11-03 | Disposition: A | Discharge status: Home / Self Care | Payer: Medicare (Managed Care) | Source: Ambulatory Visit | Attending: Interventional Cardiology | Admitting: Interventional Cardiology

## 2019-11-03 DIAGNOSIS — I214 Non-ST elevation (NSTEMI) myocardial infarction: Secondary | ICD-10-CM

## 2019-11-03 DIAGNOSIS — Z955 Presence of coronary angioplasty implant and graft: Secondary | ICD-10-CM

## 2019-11-03 MED ORDER — HYDROXYZINE HCL 25 MG PO TABS: 25.0000 mg | ORAL_TABLET | Freq: Three times a day (TID) | ORAL | 0 refills | Status: DC | PRN

## 2019-11-03 NOTE — Progress Notes (Signed)
Incomplete Session Note  Patient Details  Name: Raymond Christensen MRN: [id number] Date of Birth: 1940/03/31 Referring Provider:     CARDIAC REHAB PHASE II ORIENTATION from 10/21/2019 in [place]  Referring Provider Belva Crome, MD      [name] did not complete his rehab session.  Patient reported to cardiac rehab wearing  Crocs. Advise patient not to exercise and to return to exercise on Friday providing  he wears a closed toe shoe. Patient states understanding.Barnet Pall, RN,BSN 11/03/2019 11:16 AM

## 2019-11-05 ENCOUNTER — Encounter (HOSPITAL_COMMUNITY)
Admission: RE | Admit: 2019-11-05 | Discharge: 2019-11-05 | Disposition: A | Discharge status: Home / Self Care | Payer: Medicare (Managed Care) | Source: Ambulatory Visit | Attending: Interventional Cardiology | Admitting: Interventional Cardiology

## 2019-11-05 ENCOUNTER — Other Ambulatory Visit: Payer: Self-pay | Admitting: Interventional Cardiology

## 2019-11-05 ENCOUNTER — Other Ambulatory Visit: Payer: Self-pay

## 2019-11-05 DIAGNOSIS — I214 Non-ST elevation (NSTEMI) myocardial infarction: Secondary | ICD-10-CM | POA: Diagnosis not present

## 2019-11-05 DIAGNOSIS — Z955 Presence of coronary angioplasty implant and graft: Secondary | ICD-10-CM

## 2019-11-05 NOTE — Telephone Encounter (Signed)
Orders written. Spoke to Dr. Johnsie Cancel. Thanks

## 2019-11-06 ENCOUNTER — Other Ambulatory Visit (HOSPITAL_COMMUNITY)
Admission: RE | Admit: 2019-11-06 | Discharge: 2019-11-06 | Disposition: A | Discharge status: Home / Self Care | Payer: Medicare (Managed Care) | Source: Ambulatory Visit | Attending: Cardiovascular Disease | Admitting: Cardiovascular Disease

## 2019-11-06 DIAGNOSIS — Z20822 Contact with and (suspected) exposure to covid-19: Secondary | ICD-10-CM | POA: Diagnosis not present

## 2019-11-06 DIAGNOSIS — Z01812 Encounter for preprocedural laboratory examination: Secondary | ICD-10-CM | POA: Diagnosis present

## 2019-11-06 LAB — SARS CORONAVIRUS 2 (TAT 6-24 HRS): SARS Coronavirus 2: NEGATIVE

## 2019-11-08 ENCOUNTER — Encounter (HOSPITAL_COMMUNITY)
Admission: RE | Disposition: A | Discharge status: Home / Self Care | Payer: Medicare (Managed Care) | Source: Home / Self Care | Attending: Cardiovascular Disease

## 2019-11-08 ENCOUNTER — Encounter (HOSPITAL_COMMUNITY): Payer: Medicare (Managed Care)

## 2019-11-08 ENCOUNTER — Ambulatory Visit (HOSPITAL_COMMUNITY): Payer: Medicare (Managed Care) | Admitting: Certified Registered"

## 2019-11-08 ENCOUNTER — Encounter (HOSPITAL_COMMUNITY): Payer: Self-pay | Admitting: Cardiovascular Disease

## 2019-11-08 ENCOUNTER — Ambulatory Visit (HOSPITAL_COMMUNITY)
Admission: RE | Admit: 2019-11-08 | Discharge: 2019-11-08 | Disposition: A | Discharge status: Home / Self Care | Payer: Medicare (Managed Care) | Attending: Cardiovascular Disease | Admitting: Cardiovascular Disease

## 2019-11-08 DIAGNOSIS — Z7902 Long term (current) use of antithrombotics/antiplatelets: Secondary | ICD-10-CM | POA: Diagnosis not present

## 2019-11-08 DIAGNOSIS — Z955 Presence of coronary angioplasty implant and graft: Secondary | ICD-10-CM | POA: Insufficient documentation

## 2019-11-08 DIAGNOSIS — Z7901 Long term (current) use of anticoagulants: Secondary | ICD-10-CM | POA: Insufficient documentation

## 2019-11-08 DIAGNOSIS — Z79899 Other long term (current) drug therapy: Secondary | ICD-10-CM | POA: Insufficient documentation

## 2019-11-08 DIAGNOSIS — I5022 Chronic systolic (congestive) heart failure: Secondary | ICD-10-CM | POA: Diagnosis not present

## 2019-11-08 DIAGNOSIS — Z79890 Hormone replacement therapy (postmenopausal): Secondary | ICD-10-CM | POA: Diagnosis not present

## 2019-11-08 DIAGNOSIS — E785 Hyperlipidemia, unspecified: Secondary | ICD-10-CM | POA: Diagnosis not present

## 2019-11-08 DIAGNOSIS — I25700 Atherosclerosis of coronary artery bypass graft(s), unspecified, with unstable angina pectoris: Secondary | ICD-10-CM | POA: Insufficient documentation

## 2019-11-08 DIAGNOSIS — Z6838 Body mass index (BMI) 38.0-38.9, adult: Secondary | ICD-10-CM | POA: Insufficient documentation

## 2019-11-08 DIAGNOSIS — E039 Hypothyroidism, unspecified: Secondary | ICD-10-CM | POA: Insufficient documentation

## 2019-11-08 DIAGNOSIS — I4819 Other persistent atrial fibrillation: Secondary | ICD-10-CM | POA: Diagnosis not present

## 2019-11-08 DIAGNOSIS — E119 Type 2 diabetes mellitus without complications: Secondary | ICD-10-CM | POA: Insufficient documentation

## 2019-11-08 DIAGNOSIS — I340 Nonrheumatic mitral (valve) insufficiency: Secondary | ICD-10-CM | POA: Diagnosis not present

## 2019-11-08 DIAGNOSIS — Z882 Allergy status to sulfonamides status: Secondary | ICD-10-CM | POA: Diagnosis not present

## 2019-11-08 DIAGNOSIS — Z87891 Personal history of nicotine dependence: Secondary | ICD-10-CM | POA: Diagnosis not present

## 2019-11-08 DIAGNOSIS — G4733 Obstructive sleep apnea (adult) (pediatric): Secondary | ICD-10-CM | POA: Diagnosis not present

## 2019-11-08 DIAGNOSIS — I110 Hypertensive heart disease with heart failure: Secondary | ICD-10-CM | POA: Diagnosis not present

## 2019-11-08 HISTORY — PX: CARDIOVERSION: SHX1299

## 2019-11-08 SURGERY — CARDIOVERSION
Anesthesia: General

## 2019-11-08 MED ORDER — PROPOFOL 10 MG/ML IV BOLUS
INTRAVENOUS | Status: DC | PRN
  Administered 2019-11-08: 60 mg via INTRAVENOUS

## 2019-11-08 MED ORDER — SODIUM CHLORIDE 0.9 % IV SOLN: INTRAVENOUS | Status: DC | PRN

## 2019-11-08 MED ORDER — LIDOCAINE 2% (20 MG/ML) 5 ML SYRINGE
INTRAMUSCULAR | Status: DC | PRN
  Administered 2019-11-08: 40 mg via INTRAVENOUS

## 2019-11-08 NOTE — Transfer of Care (Signed)
Immediate Anesthesia Transfer of Care Note  Patient: Raymond Christensen  Procedure(s) Performed: CARDIOVERSION (N/A )  Patient Location: Endoscopy Unit  Anesthesia Type:General  Level of Consciousness: awake, alert  and oriented  Airway & Oxygen Therapy: Patient Spontanous Breathing  Post-op Assessment: Report given to RN, Post -op Vital signs reviewed and stable and Patient moving all extremities  Post vital signs: Reviewed and stable  Last Vitals:  Vitals Value Taken Time  BP    Temp    Pulse    Resp    SpO2      Last Pain:  Vitals:   11/08/19 1035  TempSrc: Oral  PainSc: 0-No pain         Complications: No complications documented.

## 2019-11-08 NOTE — Anesthesia Preprocedure Evaluation (Signed)
Anesthesia Evaluation  Patient identified by MRN, date of birth, ID band Patient awake    Reviewed: Allergy & Precautions, NPO status , Patient's Chart, lab work & pertinent test results, reviewed documented beta blocker date and time   Airway Mallampati: II  TM Distance: >3 FB Neck ROM: Full    Dental  (+) Teeth Intact   Pulmonary sleep apnea and Continuous Positive Airway Pressure Ventilation , former smoker,    breath sounds clear to auscultation       Cardiovascular hypertension, Pt. on home beta blockers + CAD  + dysrhythmias Atrial Fibrillation  Rhythm:Irregular Rate:Abnormal     Neuro/Psych PSYCHIATRIC DISORDERS Depression negative neurological ROS     GI/Hepatic Neg liver ROS, GERD  Medicated,  Endo/Other  diabetes, Type 2, Oral Hypoglycemic Agents, Insulin DependentHypothyroidism   Renal/GU negative Renal ROS     Musculoskeletal  (+) Arthritis ,   Abdominal Normal abdominal exam  (+)   Peds  Hematology negative hematology ROS (+)   Anesthesia Other Findings   Reproductive/Obstetrics                            Anesthesia Physical Anesthesia Plan  ASA: III  Anesthesia Plan: General   Post-op Pain Management:    Induction: Intravenous  PONV Risk Score and Plan: 0 and Propofol infusion  Airway Management Planned: Natural Airway and Simple Face Mask  Additional Equipment: None  Intra-op Plan:   Post-operative Plan:   Informed Consent: I have reviewed the patients History and Physical, chart, labs and discussed the procedure including the risks, benefits and alternatives for the proposed anesthesia with the patient or authorized representative who has indicated his/her understanding and acceptance.       Plan Discussed with: CRNA  Anesthesia Plan Comments: (Echo: 1. Left ventricular ejection fraction, by estimation, is 40 to 45%. The  left ventricle has mildly  decreased function. Left ventricular endocardial  border not optimally defined to evaluate regional wall motion.  2. Right ventricular systolic function is normal. The right ventricular  size is normal.  3. A left atrial/left atrial appendage thrombus was detected.  4. Moderate to severe regurgitation; severe by ERO and MR radius,  moderate by Rvol. Unable to pulse PV as he was not tolerating procedure  well at the time and expedited images were obtained. There appears to be a  ruptured chord and partial prolapsed  leaflet of A2. Taken together, highly suggestive of severe eccentric MR..  The mitral valve is degenerative. Moderate to severe mitral valve  regurgitation.  5. The aortic valve is tricuspid. Aortic valve regurgitation is mild. No  aortic stenosis is present.  6. There is Moderate (Grade III) plaque involving the descending aorta. )        Anesthesia Quick Evaluation

## 2019-11-08 NOTE — CV Procedure (Signed)
[facility]: Anesthesia:  Propofol Dr Smith Robert No missed doses of eliquis   [place] x 4 150 J then 200 J x 3 last with manual AP compression   Finally converted to NSR  No immediate neurologic sequelae  Jenkins Rouge MD [facility]

## 2019-11-08 NOTE — Anesthesia Postprocedure Evaluation (Signed)
Anesthesia Post Note  Patient: Raymond Christensen  Procedure(s) Performed: CARDIOVERSION (N/A )     Patient location during evaluation: PACU Anesthesia Type: General Level of consciousness: awake and alert Pain management: pain level controlled Vital Signs Assessment: post-procedure vital signs reviewed and stable Respiratory status: spontaneous breathing, nonlabored ventilation, respiratory function stable and patient connected to nasal cannula oxygen Cardiovascular status: blood pressure returned to baseline and stable Postop Assessment: no apparent nausea or vomiting Anesthetic complications: no   No complications documented.  Last Vitals:  Vitals:   11/08/19 1125 11/08/19 1135  BP: (!) 100/58 99/66  Pulse: (!) 48 (!) 49  Resp: 13 14  Temp:    SpO2: 98% 99%    Last Pain:  Vitals:   11/08/19 1135  TempSrc:   PainSc: 0-No pain                 Effie Berkshire

## 2019-11-08 NOTE — Discharge Instructions (Signed)
Electrical Cardioversion Electrical cardioversion is the delivery of a jolt of electricity to restore a normal rhythm to the heart. A rhythm that is too fast or is not regular keeps the heart from pumping well. In this procedure, sticky patches or metal paddles are placed on the chest to deliver electricity to the heart from a device. This procedure may be done in an emergency if:  There is low or no blood pressure as a result of the heart rhythm.  Normal rhythm must be restored as fast as possible to protect the brain and heart from further damage.  It may save a life. This may also be a scheduled procedure for irregular or fast heart rhythms that are not immediately life-threatening.  You may have some redness on the skin where the shocks were given. Follow these instructions at home:  Do not drive for 24 hours if you were given a sedative during your procedure.  Take over-the-counter and prescription medicines only as told by your health care provider.  Ask your health care provider how to check your pulse. Check it often.  Rest for 48 hours after the procedure or as told by your health care provider.  Avoid or limit your caffeine use as told by your health care provider.  Keep all follow-up visits as told by your health care provider. This is important. Contact a health care provider if:  You feel like your heart is beating too quickly or your pulse is not regular.  You have a serious muscle cramp that does not go away. Get help right away if:  You have discomfort in your chest.  You are dizzy or you feel faint.  You have trouble breathing or you are short of breath.  Your speech is slurred.  You have trouble moving an arm or leg on one side of your body.  Your fingers or toes turn cold or blue. Summary  Electrical cardioversion is the delivery of a jolt of electricity to restore a normal rhythm to the heart.  This procedure may be done right away in an emergency or  may be a scheduled procedure if the condition is not an emergency.  Generally, this is a safe procedure.  After the procedure, check your pulse often as told by your health care provider.

## 2019-11-08 NOTE — Interval H&P Note (Signed)
History and Physical Interval Note:  11/08/2019 9:43 AM  Raymond Christensen  has presented today for surgery, with the diagnosis of AFIB.  The various methods of treatment have been discussed with the patient and family. After consideration of risks, benefits and other options for treatment, the patient has consented to  Procedure(s): CARDIOVERSION (N/A) as a surgical intervention.  The patient's history has been reviewed, patient examined, no change in status, stable for surgery.  I have reviewed the patient's chart and labs.  Questions were answered to the patient's satisfaction.     Jenkins Rouge

## 2019-11-08 NOTE — Anesthesia Procedure Notes (Signed)
Procedure Name: General with mask airway Date/Time: 11/08/2019 11:00 AM Performed by: Amadeo Garnet, CRNA Pre-anesthesia Checklist: Patient identified, Emergency Drugs available, Suction available and Patient being monitored Patient Re-evaluated:Patient Re-evaluated prior to induction Oxygen Delivery Method: Ambu bag Preoxygenation: Pre-oxygenation with 100% oxygen Induction Type: IV induction Placement Confirmation: positive ETCO2 Dental Injury: Teeth and Oropharynx as per pre-operative assessment

## 2019-11-09 ENCOUNTER — Ambulatory Visit: Payer: Medicare (Managed Care) | Admitting: Family Medicine

## 2019-11-09 ENCOUNTER — Encounter (HOSPITAL_COMMUNITY): Payer: Self-pay | Admitting: Cardiovascular Disease

## 2019-11-10 ENCOUNTER — Encounter (HOSPITAL_COMMUNITY)
Admission: RE | Admit: 2019-11-10 | Discharge: 2019-11-10 | Disposition: A | Discharge status: Home / Self Care | Payer: Medicare (Managed Care) | Source: Ambulatory Visit | Attending: Interventional Cardiology | Admitting: Interventional Cardiology

## 2019-11-10 ENCOUNTER — Telehealth (HOSPITAL_COMMUNITY): Payer: Self-pay | Admitting: *Deleted

## 2019-11-10 ENCOUNTER — Other Ambulatory Visit: Payer: Self-pay

## 2019-11-10 DIAGNOSIS — I214 Non-ST elevation (NSTEMI) myocardial infarction: Secondary | ICD-10-CM

## 2019-11-10 DIAGNOSIS — Z955 Presence of coronary angioplasty implant and graft: Secondary | ICD-10-CM

## 2019-11-10 NOTE — Telephone Encounter (Signed)
-----   Message from Loren Racer, RN sent at 11/10/2019 12:27 PM EDT ----- Regarding: FW: Resume cardiac rehab  ----- Message ----- From: Belva Crome, MD Sent: 11/10/2019  12:20 PM EDT To: Loren Racer, RN Subject: RE: Resume cardiac rehab                       Yes, okay to resume. ----- Message ----- From: Loren Racer, RN Sent: 11/10/2019  11:39 AM EDT To: Belva Crome, MD Subject: [place] Alar: Resume cardiac rehab                        ----- Message ----- From: Magda Kiel, RN Sent: 11/10/2019  11:31 AM EDT To: Loren Racer, RN Subject: Resume cardiac rehab                           Good morning Anderson Malta,   Can you ask Dr Tamala Julian Is it okay for Mr Chilson to resume exercise at cardiac rehab? Mr Bunt had a cardioversion on Monday 11/08/19.   Thanks for your assistance!  Sincerely,  Barnet Pall RN Cardiac Rehab

## 2019-11-10 NOTE — Progress Notes (Signed)
Incomplete Session Note  Patient Details  Name: Raymond Christensen MRN: [id number] Date of Birth: 1940-07-03 Referring Provider:     CARDIAC REHAB PHASE II ORIENTATION from 10/21/2019 in [place]  Referring Provider Belva Crome, MD      [name] did not complete his rehab session.  Patient reported to exercise post cardioversion. Telemetry rhythm Sinus 64.  BP 110/52. Will get clearance from Dr Tamala Julian for patient to resume. Patient states understanding.Barnet Pall, RN,BSN 11/10/2019 11:30 AM

## 2019-11-11 NOTE — Progress Notes (Signed)
Cardiac Individual Treatment Plan  Patient Details  Name: Raymond Christensen MRN: [id number] Date of Birth: [birth year] Referring Provider:     [place] from 10/21/2019 in [place]  Referring Provider Belva Crome, MD      Initial Encounter Date:    CARDIAC REHAB PHASE II ORIENTATION from 10/21/2019 in [place]  Date 10/21/19      Visit Diagnosis: NSTEMI (non-ST elevated myocardial infarction) ([facility]) 09/09/19  S/P DES SVG to OM2 and PDA 09/09/19  Patient's Home Medications on Admission:  Current Outpatient Medications:  .  amiodarone (PACERONE) 200 MG tablet, Take 1 tablet (200 mg total) by mouth daily., Disp: 90 tablet, Rfl: 3 .  apixaban (ELIQUIS) 5 MG TABS tablet, Take 1 tablet (5 mg total) by mouth 2 (two) times daily. (Patient taking differently: Take 5 mg by mouth daily. ), Disp: 180 tablet, Rfl: 3 .  aspirin 81 MG EC tablet, Take 1 tablet (81 mg total) by mouth at bedtime. Swallow whole. (Patient not taking: Reported on 10/22/2019), Disp: 30 tablet, Rfl: 0 .  atorvastatin (LIPITOR) 80 MG tablet, Take 1 tablet (80 mg total) by mouth daily., Disp: 90 tablet, Rfl: 1 .  carvedilol (COREG) 6.25 MG tablet, Take 1 tablet (6.25 mg total) by mouth 2 (two) times daily with a meal. (Patient taking differently: Take 6.25 mg by mouth daily. ), Disp: 180 tablet, Rfl: 3 .  Cholecalciferol (VITAMIN D3) 250 MCG (10000 UT) capsule, Take 10,000 Units by mouth daily. , Disp: , Rfl:  .  clopidogrel (PLAVIX) 75 MG tablet, Take 1 tablet (75 mg total) by mouth daily with breakfast., Disp: 30 tablet, Rfl: 3 .  colchicine 0.6 MG tablet, Take 1 tablet (0.6 mg total) by mouth daily as needed. (Patient taking differently: Take 0.6 mg by mouth daily as needed (Gout). ), Disp: 90 tablet, Rfl: 1 .  Cyanocobalamin (VITAMIN B-12 PO), Take 1,000 mcg by mouth daily. , Disp: , Rfl:  .  ezetimibe (ZETIA) 10 MG tablet, Take 1 tablet  (10 mg total) by mouth daily., Disp: 90 tablet, Rfl: 1 .  famotidine (PEPCID) 20 MG tablet, Take 1 tablet (20 mg total) by mouth daily., Disp: 30 tablet, Rfl: 3 .  finasteride (PROSCAR) 5 MG tablet, TAKE 1 TABLET DAILY (Patient taking differently: Take 5 mg by mouth daily. ), Disp: 90 tablet, Rfl: 1 .  furosemide (LASIX) 40 MG tablet, Take 1 tablet (40 mg total) by mouth daily., Disp: 90 tablet, Rfl: 3 .  hydrOXYzine (ATARAX/VISTARIL) 25 MG tablet, Take 1-2 tablets (25-50 mg total) by mouth 3 (three) times daily as needed for anxiety., Disp: 30 tablet, Rfl: 0 .  levocetirizine (XYZAL) 5 MG tablet, TAKE 1 TABLET EVERY EVENING (Patient taking differently: Take 5 mg by mouth every evening. ), Disp: 90 tablet, Rfl: 1 .  levothyroxine (SYNTHROID) 75 MCG tablet, TAKE 1 TABLET DAILY (Patient taking differently: Take 75 mcg by mouth daily before breakfast. ), Disp: 90 tablet, Rfl: 1 .  liothyronine (CYTOMEL) 5 MCG tablet, TAKE 2 TABLETS DAILY (Patient taking differently: Take 10 mcg by mouth daily. ), Disp: 180 tablet, Rfl: 3 .  Multiple Vitamins-Minerals (MULTIVITAMIN ADULT EXTRA C PO), Take 1 tablet by mouth daily. , Disp: , Rfl:  .  nitroGLYCERIN (NITROSTAT) 0.4 MG SL tablet, Place 1 tablet (0.4 mg total) under the tongue every 5 (five) minutes as needed for chest pain., Disp: 30 tablet, Rfl: 12 .  omega-3  acid ethyl esters (LOVAZA) 1 g capsule, Take 2 capsules (2 g total) by mouth 2 (two) times daily., Disp: 360 capsule, Rfl: 2 .  potassium chloride (KLOR-CON 10) 10 MEQ tablet, Take 2 tablets (20 mEq total) by mouth daily as needed (with every 40mg  dose of Lasix). (Patient taking differently: Take 20 mEq by mouth daily. ), Disp: 90 tablet, Rfl: 1 .  sacubitril-valsartan (ENTRESTO) 24-26 MG, Take 1 tablet by mouth 2 (two) times daily., Disp: 180 tablet, Rfl: 3 .  spironolactone (ALDACTONE) 25 MG tablet, Take 12.5 mg by mouth daily. , Disp: , Rfl:  .  tamsulosin (FLOMAX) 0.4 MG CAPS capsule, TAKE 1 CAPSULE  DAILY (Patient taking differently: Take 0.4 mg by mouth at bedtime. ), Disp: 90 capsule, Rfl: 3  Past Medical History: Past Medical History:  Diagnosis Date  . Arthritis   . CAD (coronary artery disease)   . Concussion Summer 2012  . Essential hypertension 02/26/2017  . Heart disease   . Hyperlipidemia   . Hypothyroidism 10/15/2017  . Major depressive disorder   . Obstructive sleep apnea on CPAP   . Poor flexibility of tendon 12/01/2018  . Subungual hematoma of digit of hand 12/01/2018  . Subungual hematoma of right foot 12/01/2018  . Thyroid disease   . Type 2 diabetes mellitus without complication, without long-term current use of insulin ([place]) 02/26/2017    Tobacco Use: Social History   Tobacco Use  Smoking Status Former Smoker  . Types: Cigarettes  Smokeless Tobacco Never Used    Labs: Recent Review Flowsheet Data    Labs for ITP Cardiac and Pulmonary Rehab Latest Ref Rng & Units 06/22/2019 09/08/2019 09/09/2019 09/09/2019 09/09/2019   Cholestrol 0 - 200 mg/dL 155 135 - - -   LDLCALC 0 - 99 mg/dL - 76 - - -   LDLDIRECT mg/dL 76.0 - - - -   HDL >40 mg/dL 39.80 42 - - -   Trlycerides <150 mg/dL 311.0(H) 85 - - -   Hemoglobin A1c 4.6 - 6.5 % 6.6(H) - - - -   PHART 7.35 - 7.45 - - - - 7.386   PCO2ART 32 - 48 mmHg - - - - 49.2(H)   HCO3 20.0 - 28.0 mmol/L - - 30.1(H) 29.9(H) 29.5(H)   TCO2 22 - 32 mmol/L - - 32 31 31   O2SAT % - - 70.0 71.0 100.0      Capillary Blood Glucose: Lab Results  Component Value Date   GLUCAP 123 (H) 09/09/2019   GLUCAP 134 (H) 09/08/2019     Exercise Target Goals: Exercise Program Goal: Individual exercise prescription set using results from initial 6 min walk test and THRR while considering  patient's activity barriers and safety.   Exercise Prescription Goal: Starting with aerobic activity 30 plus minutes a day, 3 days per week for initial exercise prescription. Provide home exercise prescription and guidelines that participant  acknowledges understanding prior to discharge.  Activity Barriers & Risk Stratification:  Activity Barriers & Cardiac Risk Stratification - 10/21/19 1320      Activity Barriers & Cardiac Risk Stratification   Activity Barriers Other (comment)    Comments --           6 Minute Walk:  6 Minute Walk    Row Name 10/21/19 1047         6 Minute Walk   Phase Initial     Distance 1380 feet     Walk Time 6 minutes     #  of Rest Breaks 0     MPH 2.61     METS 2.33     RPE 11     Perceived Dyspnea  0     VO2 Peak 8.16     Symptoms No     Resting HR 66 bpm     Resting BP 114/78     Resting Oxygen Saturation  98 %     Exercise Oxygen Saturation  during 6 min walk 98 %     Max Ex. HR 136 bpm     Max Ex. BP 134/80     2 Minute Post BP 128/78            Oxygen Initial Assessment:   Oxygen Re-Evaluation:   Oxygen Discharge (Final Oxygen Re-Evaluation):   Initial Exercise Prescription:  Initial Exercise Prescription - 10/21/19 1300      Date of Initial Exercise RX and Referring Provider   Date 10/21/19    Referring Provider Belva Crome, MD    Expected Discharge Date 12/17/19      NuStep   Level 2    SPM 75    Minutes 30    METs 2      Prescription Details   Frequency (times per week) 3    Duration Progress to 30 minutes of continuous aerobic without signs/symptoms of physical distress      Intensity   THRR 40-80% of Max Heartrate 57-114    Ratings of Perceived Exertion 11-13    Perceived Dyspnea 0-4      Progression   Progression Continue progressive overload as per policy without signs/symptoms or physical distress.      Resistance Training   Training Prescription Yes    Weight 3 lbs    Reps 10-15           Perform Capillary Blood Glucose checks as needed.  Exercise Prescription Changes:  Exercise Prescription Changes    Row Name 10/25/19 1300 11/05/19 1034           Response to Exercise   Blood Pressure (Admit) 112/62 104/62       Blood Pressure (Exercise) 118/72 122/68      Blood Pressure (Exit) 100/60 108/62      Heart Rate (Admit) 91 bpm 91 bpm      Heart Rate (Exercise) 93 bpm 94 bpm      Heart Rate (Exit) 75 bpm 91 bpm      Rating of Perceived Exertion (Exercise) 10 11      Perceived Dyspnea (Exercise) 0 0      Symptoms None None      Comments Pt's first day of exercise None      Duration Progress to 10 minutes continuous walking  at current work load and total walking time to 30-45 min Progress to 10 minutes continuous walking  at current work load and total walking time to 30-45 min      Intensity THRR unchanged THRR unchanged        Progression   Progression Continue to progress workloads to maintain intensity without signs/symptoms of physical distress. Continue to progress workloads to maintain intensity without signs/symptoms of physical distress.      Average METs 2.5 2.6        Resistance Training   Training Prescription Yes No      Weight 3 lbs --      Reps 10-15 --      Time 10 Minutes --  Interval Training   Interval Training No No        NuStep   Level 2 3      SPM 85 90      Minutes 30 30      METs 2.5 2.6             Exercise Comments:  Exercise Comments    Row Name 10/25/19 1354 11/11/19 1035         Exercise Comments Pt's first day of cardiac rehab. Pt responded well to exercise. Will continue to increase workloads and monitor pt. Pt is responding well to prescription and workload increases. Will follow up with pt regarding home exercise plan.             Exercise Goals and Review:  Exercise Goals    Row Name 10/21/19 1321             Exercise Goals   Increase Physical Activity Yes       Intervention Provide advice, education, support and counseling about physical activity/exercise needs.;Develop an individualized exercise prescription for aerobic and resistive training based on initial evaluation findings, risk stratification, comorbidities and participant's  personal goals.       Expected Outcomes Short Term: Attend rehab on a regular basis to increase amount of physical activity.;Long Term: Add in home exercise to make exercise part of routine and to increase amount of physical activity.;Long Term: Exercising regularly at least 3-5 days a week.       Increase Strength and Stamina Yes       Intervention Provide advice, education, support and counseling about physical activity/exercise needs.;Develop an individualized exercise prescription for aerobic and resistive training based on initial evaluation findings, risk stratification, comorbidities and participant's personal goals.       Expected Outcomes Short Term: Increase workloads from initial exercise prescription for resistance, speed, and METs.;Short Term: Perform resistance training exercises routinely during rehab and add in resistance training at home;Long Term: Improve cardiorespiratory fitness, muscular endurance and strength as measured by increased METs and functional capacity (6MWT)       Able to understand and use rate of perceived exertion (RPE) scale Yes       Intervention Provide education and explanation on how to use RPE scale       Expected Outcomes Short Term: Able to use RPE daily in rehab to express subjective intensity level;Long Term:  Able to use RPE to guide intensity level when exercising independently       Knowledge and understanding of Target Heart Rate Range (THRR) Yes       Intervention Provide education and explanation of THRR including how the numbers were predicted and where they are located for reference       Expected Outcomes Short Term: Able to state/look up THRR;Short Term: Able to use daily as guideline for intensity in rehab;Long Term: Able to use THRR to govern intensity when exercising independently       Able to check pulse independently Yes       Intervention Provide education and demonstration on how to check pulse in carotid and radial arteries.;Review the  importance of being able to check your own pulse for safety during independent exercise       Expected Outcomes Short Term: Able to explain why pulse checking is important during independent exercise;Long Term: Able to check pulse independently and accurately       Understanding of Exercise Prescription Yes       Intervention Provide  education, explanation, and written materials on patient's individual exercise prescription       Expected Outcomes Short Term: Able to explain program exercise prescription;Long Term: Able to explain home exercise prescription to exercise independently              Exercise Goals Re-Evaluation :  Exercise Goals Re-Evaluation    Row Name 10/25/19 1356             Exercise Goal Re-Evaluation   Exercise Goals Review Increase Physical Activity;Knowledge and understanding of Target Heart Rate Range (THRR);Understanding of Exercise Prescription       Comments Pt's first day of cardaic rehab. Pt able to exercise 30 minutes with minimal difficulty. Will continue to monitor.       Expected Outcomes Pt will continue to increase strength and stamina.               Discharge Exercise Prescription (Final Exercise Prescription Changes):  Exercise Prescription Changes - 11/05/19 1034      Response to Exercise   Blood Pressure (Admit) 104/62    Blood Pressure (Exercise) 122/68    Blood Pressure (Exit) 108/62    Heart Rate (Admit) 91 bpm    Heart Rate (Exercise) 94 bpm    Heart Rate (Exit) 91 bpm    Rating of Perceived Exertion (Exercise) 11    Perceived Dyspnea (Exercise) 0    Symptoms None    Comments None    Duration Progress to 10 minutes continuous walking  at current work load and total walking time to 30-45 min    Intensity THRR unchanged      Progression   Progression Continue to progress workloads to maintain intensity without signs/symptoms of physical distress.    Average METs 2.6      Resistance Training   Training Prescription No       Interval Training   Interval Training No      NuStep   Level 3    SPM 90    Minutes 30    METs 2.6           Nutrition:  Target Goals: Understanding of nutrition guidelines, daily intake of sodium 1500mg , cholesterol 200mg , calories 30% from fat and 7% or less from saturated fats, daily to have 5 or more servings of fruits and vegetables.  Biometrics:  Pre Biometrics - 10/21/19 0930      Pre Biometrics   Waist Circumference 48 inches    Hip Circumference 49.5 inches    Waist to Hip Ratio 0.97 %    Triceps Skinfold 15 mm    % Body Fat 36.1 %    Grip Strength 35 kg    Flexibility 0 in   Pt could not reach box   Single Leg Stand 2.37 seconds   High risk for fall           Nutrition Therapy Plan and Nutrition Goals:  Nutrition Therapy & Goals - 11/01/19 1324      Nutrition Therapy   Diet Heart healthy/carb modified    Drug/Food Interactions Statins/Certain Fruits      Personal Nutrition Goals   Nutrition Goal Pt to reduce night time snacking    Personal Goal #2 Pt to identify food quantities necessary to achieve weight loss of 6-24 lb at graduation from cardiac rehab.    Personal Goal #3 Pt to build a healthy plate including vegetables, fruits, whole grains, and low-fat dairy products in a heart healthy meal plan.  Intervention Plan   Intervention Prescribe, educate and counsel regarding individualized specific dietary modifications aiming towards targeted core components such as weight, hypertension, lipid management, diabetes, heart failure and other comorbidities.    Expected Outcomes Short Term Goal: Understand basic principles of dietary content, such as calories, fat, sodium, cholesterol and nutrients.           Nutrition Assessments:  Nutrition Assessments - 11/01/19 1325      MEDFICTS Scores   Pre Score 61           Nutrition Goals Re-Evaluation:  Nutrition Goals Re-Evaluation    Row Name 11/01/19 1325             Goals   Current  Weight 242 lb (109.8 kg)       Nutrition Goal Pt to reduce night time snacking         Personal Goal #2 Re-Evaluation   Personal Goal #2 Pt to identify food quantities necessary to achieve weight loss of 6-24 lb at graduation from cardiac rehab.         Personal Goal #3 Re-Evaluation   Personal Goal #3 Pt to build a healthy plate including vegetables, fruits, whole grains, and low-fat dairy products in a heart healthy meal plan.              Nutrition Goals Discharge (Final Nutrition Goals Re-Evaluation):  Nutrition Goals Re-Evaluation - 11/01/19 1325      Goals   Current Weight 242 lb (109.8 kg)    Nutrition Goal Pt to reduce night time snacking      Personal Goal #2 Re-Evaluation   Personal Goal #2 Pt to identify food quantities necessary to achieve weight loss of 6-24 lb at graduation from cardiac rehab.      Personal Goal #3 Re-Evaluation   Personal Goal #3 Pt to build a healthy plate including vegetables, fruits, whole grains, and low-fat dairy products in a heart healthy meal plan.           Psychosocial: Target Goals: Acknowledge presence or absence of significant depression and/or stress, maximize coping skills, provide positive support system. Participant is able to verbalize types and ability to use techniques and skills needed for reducing stress and depression.  Initial Review & Psychosocial Screening:  Initial Psych Review & Screening - 10/21/19 1430      Initial Review   Current issues with Current Depression      Family Dynamics   Good Support System? Yes   Sakari has his wife for support   Comments Kreed says that he gets depressed at times.  Patient is not currently on an antidepressant.      Barriers   Psychosocial barriers to participate in program The patient should benefit from training in stress management and relaxation.      Screening Interventions   Interventions Encouraged to exercise;Provide feedback about the scores to participant;To  provide support and resources with identified psychosocial needs    Expected Outcomes Short Term goal: Utilizing psychosocial counselor, staff and physician to assist with identification of specific Stressors or current issues interfering with healing process. Setting desired goal for each stressor or current issue identified.;Long Term Goal: Stressors or current issues are controlled or eliminated.;Short Term goal: Identification and review with participant of any Quality of Life or Depression concerns found by scoring the questionnaire.;Long Term goal: The participant improves quality of Life and PHQ9 Scores as seen by post scores and/or verbalization of changes  Quality of Life Scores:  Quality of Life - 10/21/19 1310      Quality of Life   Select Quality of Life      Quality of Life Scores   Health/Function Pre 21.53 %    Socioeconomic Pre 25.36 %    Psych/Spiritual Pre 20.86 %    Family Pre 26.4 %    GLOBAL Pre 22.9 %          Scores of 19 and below usually indicate a poorer quality of life in these areas.  A difference of  2-3 points is a clinically meaningful difference.  A difference of 2-3 points in the total score of the Quality of Life Index has been associated with significant improvement in overall quality of life, self-image, physical symptoms, and general health in studies assessing change in quality of life.  PHQ-9: Recent Review Flowsheet Data    Depression screen [facility] 2/9 10/21/2019 06/02/2019 02/04/2019 01/12/2018 03/18/2017   Decreased Interest 0 1 0 0 0   Down, Depressed, Hopeless 1 1 0 1 0   PHQ - 2 Score 1 2 0 1 0   Altered sleeping - 0 - - -   Tired, decreased energy - 1 - - -   Change in appetite - 0 - - -   Feeling bad or failure about yourself  - 0 - - -   Trouble concentrating - 0 - - -   Moving slowly or fidgety/restless - 0 - - -   Suicidal thoughts - 0 - - -   PHQ-9 Score - 3 - - -   Difficult doing work/chores - Somewhat difficult - - -      Interpretation of Total Score  Total Score Depression Severity:  1-4 = Minimal depression, 5-9 = Mild depression, 10-14 = Moderate depression, 15-19 = Moderately severe depression, 20-27 = Severe depression   Psychosocial Evaluation and Intervention:   Psychosocial Re-Evaluation:  Psychosocial Re-Evaluation    Row Name 11/10/19 1352             Psychosocial Re-Evaluation   Current issues with Current Depression       Comments Patient has not been at cardiac rehab to exercise this week.       Expected Outcomes Patient will have decreased depression upon completion of phase 2 cardiac rehab.       Continue Psychosocial Services  Follow up required by staff              Psychosocial Discharge (Final Psychosocial Re-Evaluation):  Psychosocial Re-Evaluation - 11/10/19 1352      Psychosocial Re-Evaluation   Current issues with Current Depression    Comments Patient has not been at cardiac rehab to exercise this week.    Expected Outcomes Patient will have decreased depression upon completion of phase 2 cardiac rehab.    Continue Psychosocial Services  Follow up required by staff           Vocational Rehabilitation: Provide vocational rehab assistance to qualifying candidates.   Vocational Rehab Evaluation & Intervention:  Vocational Rehab - 10/21/19 1435      Initial Vocational Rehab Evaluation & Intervention   Assessment shows need for Vocational Rehabilitation No           Education: Education Goals: Education classes will be provided on a weekly basis, covering required topics. Participant will state understanding/return demonstration of topics presented.  Learning Barriers/Preferences:  Learning Barriers/Preferences - 10/21/19 1311      Learning Barriers/Preferences  Learning Barriers Hearing   Bilateral hearing aids   Learning Preferences None           Education Topics: Hypertension, Hypertension Reduction -Define heart disease and high blood  pressure. Discus how high blood pressure affects the body and ways to reduce high blood pressure.   Exercise and Your Heart -Discuss why it is important to exercise, the FITT principles of exercise, normal and abnormal responses to exercise, and how to exercise safely.   Angina -Discuss definition of angina, causes of angina, treatment of angina, and how to decrease risk of having angina.   Cardiac Medications -Review what the following cardiac medications are used for, how they affect the body, and side effects that may occur when taking the medications.  Medications include Aspirin, Beta blockers, calcium channel blockers, ACE Inhibitors, angiotensin receptor blockers, diuretics, digoxin, and antihyperlipidemics.   Congestive Heart Failure -Discuss the definition of CHF, how to live with CHF, the signs and symptoms of CHF, and how keep track of weight and sodium intake.   Heart Disease and Intimacy -Discus the effect sexual activity has on the heart, how changes occur during intimacy as we age, and safety during sexual activity.   Smoking Cessation / COPD -Discuss different methods to quit smoking, the health benefits of quitting smoking, and the definition of COPD.   Nutrition I: Fats -Discuss the types of cholesterol, what cholesterol does to the heart, and how cholesterol levels can be controlled.   Nutrition II: Labels -Discuss the different components of food labels and how to read food label   Heart Parts/Heart Disease and PAD -Discuss the anatomy of the heart, the pathway of blood circulation through the heart, and these are affected by heart disease.   Stress I: Signs and Symptoms -Discuss the causes of stress, how stress may lead to anxiety and depression, and ways to limit stress.   Stress II: Relaxation -Discuss different types of relaxation techniques to limit stress.   Warning Signs of Stroke / TIA -Discuss definition of a stroke, what the signs and  symptoms are of a stroke, and how to identify when someone is having stroke.   Knowledge Questionnaire Score:  Knowledge Questionnaire Score - 10/21/19 1309      Knowledge Questionnaire Score   Pre Score 20/24           Core Components/Risk Factors/Patient Goals at Admission:  Personal Goals and Risk Factors at Admission - 10/21/19 1312      Core Components/Risk Factors/Patient Goals on Admission    Weight Management Yes;Obesity;Weight Loss    Intervention Weight Management: Develop a combined nutrition and exercise program designed to reach desired caloric intake, while maintaining appropriate intake of nutrient and fiber, sodium and fats, and appropriate energy expenditure required for the weight goal.;Weight Management: Provide education and appropriate resources to help participant work on and attain dietary goals.;Weight Management/Obesity: Establish reasonable short term and long term weight goals.;Obesity: Provide education and appropriate resources to help participant work on and attain dietary goals.    Admit Weight 245 lb 13 oz (111.5 kg)    Expected Outcomes Short Term: Continue to assess and modify interventions until short term weight is achieved;Long Term: Adherence to nutrition and physical activity/exercise program aimed toward attainment of established weight goal;Weight Maintenance: Understanding of the daily nutrition guidelines, which includes 25-35% calories from fat, 7% or less cal from saturated fats, less than 200mg  cholesterol, less than 1.5gm of sodium, & 5 or more servings of fruits and vegetables daily;Weight  Loss: Understanding of general recommendations for a balanced deficit meal plan, which promotes 1-2 lb weight loss per week and includes a negative energy balance of [phone] kcal/d;Understanding recommendations for meals to include 15-35% energy as protein, 25-35% energy from fat, 35-60% energy from carbohydrates, less than 200mg  of dietary cholesterol, 20-35 gm  of total fiber daily;Understanding of distribution of calorie intake throughout the day with the consumption of 4-5 meals/snacks    Diabetes Yes    Intervention Provide education about signs/symptoms and action to take for hypo/hyperglycemia.;Provide education about proper nutrition, including hydration, and aerobic/resistive exercise prescription along with prescribed medications to achieve blood glucose in normal ranges: Fasting glucose 65-99 mg/dL    Expected Outcomes Short Term: Participant verbalizes understanding of the signs/symptoms and immediate care of hyper/hypoglycemia, proper foot care and importance of medication, aerobic/resistive exercise and nutrition plan for blood glucose control.;Long Term: Attainment of HbA1C < 7%.    Hypertension Yes    Intervention Monitor prescription use compliance.;Provide education on lifestyle modifcations including regular physical activity/exercise, weight management, moderate sodium restriction and increased consumption of fresh fruit, vegetables, and low fat dairy, alcohol moderation, and smoking cessation.    Expected Outcomes Short Term: Continued assessment and intervention until BP is < 140/32mm HG in hypertensive participants. < 130/66mm HG in hypertensive participants with diabetes, heart failure or chronic kidney disease.;Long Term: Maintenance of blood pressure at goal levels.    Lipids Yes    Intervention Provide education and support for participant on nutrition & aerobic/resistive exercise along with prescribed medications to achieve LDL 70mg , HDL >40mg .    Expected Outcomes Short Term: Participant states understanding of desired cholesterol values and is compliant with medications prescribed. Participant is following exercise prescription and nutrition guidelines.;Long Term: Cholesterol controlled with medications as prescribed, with individualized exercise RX and with personalized nutrition plan. Value goals: LDL < 70mg , HDL > 40 mg.             Core Components/Risk Factors/Patient Goals Review:   Goals and Risk Factor Review    Row Name 11/11/19 1150 11/11/19 1151           Core Components/Risk Factors/Patient Goals Review   Personal Goals Review Weight Management/Obesity Weight Management/Obesity;Lipids;Diabetes;Hypertension      Review -- Tarius has been fair with exercise. Kinnick's vital signs have been stable. Bensyn's diabetes is diet controlled.      Expected Outcomes -- Froylan will continue to participate in phase 2 cardiac rehab for exercise, diet and lifestyle modifications             Core Components/Risk Factors/Patient Goals at Discharge (Final Review):   Goals and Risk Factor Review - 11/11/19 1151      Core Components/Risk Factors/Patient Goals Review   Personal Goals Review Weight Management/Obesity;Lipids;Diabetes;Hypertension    Review Jalal has been fair with exercise. Acen's vital signs have been stable. Wen's diabetes is diet controlled.    Expected Outcomes Kohen will continue to participate in phase 2 cardiac rehab for exercise, diet and lifestyle modifications           ITP Comments:  ITP Comments    Row Name 10/21/19 1359 10/21/19 1424 11/10/19 1349       ITP Comments Dr Fransico Him MD, Medical Director Dr Fransico Him MD, Medical Director 30 Day ITP Review. Shawnee is off to a fair start to exercise            Comments: See ITP comments. Patient was cardioverted on 11/08/19. Patient to return to  exercise on 11/12/19.Barnet Pall, RN,BSN 11/11/2019 11:59 AM

## 2019-11-12 ENCOUNTER — Encounter (HOSPITAL_COMMUNITY)
Admission: RE | Admit: 2019-11-12 | Discharge: 2019-11-12 | Disposition: A | Discharge status: Home / Self Care | Payer: Medicare (Managed Care) | Source: Ambulatory Visit | Attending: Interventional Cardiology | Admitting: Interventional Cardiology

## 2019-11-12 ENCOUNTER — Other Ambulatory Visit: Payer: Self-pay

## 2019-11-12 DIAGNOSIS — I214 Non-ST elevation (NSTEMI) myocardial infarction: Secondary | ICD-10-CM

## 2019-11-12 DIAGNOSIS — Z955 Presence of coronary angioplasty implant and graft: Secondary | ICD-10-CM

## 2019-11-15 ENCOUNTER — Encounter (HOSPITAL_COMMUNITY)
Admission: RE | Admit: 2019-11-15 | Discharge: 2019-11-15 | Disposition: A | Discharge status: Home / Self Care | Payer: Medicare (Managed Care) | Source: Ambulatory Visit | Attending: Interventional Cardiology | Admitting: Interventional Cardiology

## 2019-11-15 ENCOUNTER — Other Ambulatory Visit: Payer: Self-pay

## 2019-11-15 DIAGNOSIS — I214 Non-ST elevation (NSTEMI) myocardial infarction: Secondary | ICD-10-CM | POA: Diagnosis present

## 2019-11-15 DIAGNOSIS — Z955 Presence of coronary angioplasty implant and graft: Secondary | ICD-10-CM

## 2019-11-15 MED ORDER — SPIRONOLACTONE 25 MG PO TABS
12.5000 mg | ORAL_TABLET | Freq: Every day | ORAL | 3 refills | Status: DC
Start: 2019-11-15 — End: 2020-09-29

## 2019-11-15 NOTE — Progress Notes (Signed)
QUALITY OF LIFE SCORE REVIEW  Pt completed Quality of Life survey as a participant in Cardiac Rehab. Scores 21.0 or below are considered low. Pt score very low in several areas Overall 22.90, Health and Function 21.53, socioeconomic 25.36, physiological and spiritual 20.86, family 26.40. Patient quality of life slightly altered by physical constraints which limits ability to perform as prior to recent cardiac illness.  Raymond Christensen says that he has been feeling better since he was cardioverted and has been participating in phase 2 cardiac rehab. Offered emotional support and reassurance.  Will continue to monitor and intervene as necessary. Barnet Pall, RN,BSN 11/15/2019 12:53 PM

## 2019-11-16 NOTE — Progress Notes (Signed)
Reviewed home exercise guidelines with patient including endpoints, temperature precautions, target heart rate and rate of perceived exertion. Patient is stretching some at home but is not walking. Encouraged patient to add 1-2 days/week walking 30 minutes either 15 minutes twice/day or 30 minutes once/day as his mode of home exercise, and patient is agreeable to this. Patient states that he is able to manually count is pulse. Patient voices understanding of instructions given.  Sol Passer, MS, ACSM CEP

## 2019-11-16 NOTE — Progress Notes (Signed)
Cardiology Office Note:    Date:  11/17/2019   ID:  Raymond Christensen, DOB Feb 17, 1940, MRN [id number]  PCP:  [name], DO  Cardiologist:  Sinclair Grooms, MD   Referring MD: [name]*   Chief Complaint  Patient presents with  . Coronary Artery Disease  . Cardiac Valve Problem  . Congestive Heart Failure  . Atrial Fibrillation    History of Present Illness:    Raymond Christensen is a 79 y.o. male with a hx of new AFib, elevated troponin and new reduced Ef 35-40% 08/2019, and mod to severe MR, LA thrombus on TEE. Recent PCI with stent SVG to Cfx. Past history of MI in 1994 c/b cardiac arrest s/p lytics and PTCA, CABG in 2001 (LIMA to LAD, SVG to OM-2, SVG to right PDA, SVG to D-2) with post-op AF, carotid stenosis s/p L CEA and right subclavian stenosis, diet-controlled DM, HTN, obesity, OSA on CPAP, mild cognitive impairment, and depression/mood disorder.  He had successful electrical cardioversion November 08, 2019.  He has been in cardiac rehab and is been in rhythm until today's session which demonstrated A. fib.  In speaking with the patient, after cardioversion there was not a dramatic improvement in exertional tolerance, breathing, or other CV symptoms.  He has felt well and has been able to gain endurance by working in cardiac rehab.  He was surprised today to learn that he was out of rhythm.  He would not of known.  He denies orthopnea, PND, lower extremity swelling, palpitations, and syncope.  He has moderately severe to severe mitral regurgitation based both on transthoracic and transesophageal echocardiography, both of which were done with him in atrial fibrillation.  Atrial fibrillation was of unknown duration.  He was loaded with amiodarone.  Despite being on amiodarone for a month with 3-week 400 mg loading.,  He was not able to maintain sinus rhythm.  The cardioversion required for electrical discharges but was successful on the fourth by Dr. Nolon Lennert.   Please see description below.  Past Medical History:  Diagnosis Date  . Arthritis   . CAD (coronary artery disease)   . Concussion Summer 2012  . Essential hypertension 02/26/2017  . Heart disease   . Hyperlipidemia   . Hypothyroidism 10/15/2017  . Major depressive disorder   . Obstructive sleep apnea on CPAP   . Poor flexibility of tendon 12/01/2018  . Subungual hematoma of digit of hand 12/01/2018  . Subungual hematoma of right foot 12/01/2018  . Thyroid disease   . Type 2 diabetes mellitus without complication, without long-term current use of insulin ([place]) 02/26/2017    Past Surgical History:  Procedure Laterality Date  . BYPASS GRAFT  2001  . CARDIAC CATHETERIZATION    . CARDIOVERSION N/A 11/08/2019   Procedure: CARDIOVERSION;  Surgeon: [name], MD;  Location: [facility] ENDOSCOPY;  Service: Cardiovascular;  Laterality: N/A;  . CAROTID ARTERY ANGIOPLASTY    . RIGHT/LEFT HEART CATH AND CORONARY/GRAFT ANGIOGRAPHY N/A 09/09/2019   Procedure: RIGHT/LEFT HEART CATH AND CORONARY/GRAFT ANGIOGRAPHY;  Surgeon: [name], MD;  Location: [facility] CV LAB;  Service: Cardiovascular;  Laterality: N/A;  . TEE WITHOUT CARDIOVERSION N/A 09/13/2019   Procedure: TRANSESOPHAGEAL ECHOCARDIOGRAM (TEE);  Surgeon: [name], MD;  Location: [facility] ENDOSCOPY;  Service: Cardiovascular;  Laterality: N/A;  . [place]    Current Medications: Current Meds  Medication Sig  . amiodarone (PACERONE) 200 MG tablet Take 2 tablets by mouth daily until  12/09/19, then decrease to one tablet by mouth daily  . apixaban (ELIQUIS) 5 MG TABS tablet Take 1 tablet (5 mg total) by mouth 2 (two) times daily.  Marland Kitchen atorvastatin (LIPITOR) 80 MG tablet Take 1 tablet (80 mg total) by mouth daily.  . carvedilol (COREG) 6.25 MG tablet Take 1 tablet (6.25 mg total) by mouth 2 (two) times daily with a meal.  . Cholecalciferol (VITAMIN D3) 250 MCG (10000 UT) capsule Take 10,000 Units by  mouth daily.   . clopidogrel (PLAVIX) 75 MG tablet Take 1 tablet (75 mg total) by mouth daily with breakfast.  . colchicine 0.6 MG tablet Take 1 tablet (0.6 mg total) by mouth daily as needed.  . Cyanocobalamin (VITAMIN B-12 PO) Take 1,000 mcg by mouth daily.   Marland Kitchen ezetimibe (ZETIA) 10 MG tablet Take 1 tablet (10 mg total) by mouth daily.  . famotidine (PEPCID) 20 MG tablet Take 1 tablet (20 mg total) by mouth daily.  . finasteride (PROSCAR) 5 MG tablet TAKE 1 TABLET DAILY  . furosemide (LASIX) 40 MG tablet Take 1 tablet (40 mg total) by mouth daily.  Marland Kitchen levocetirizine (XYZAL) 5 MG tablet TAKE 1 TABLET EVERY EVENING  . levothyroxine (SYNTHROID) 75 MCG tablet TAKE 1 TABLET DAILY  . liothyronine (CYTOMEL) 5 MCG tablet TAKE 2 TABLETS DAILY  . Multiple Vitamins-Minerals (MULTIVITAMIN ADULT EXTRA C PO) Take 1 tablet by mouth daily.   . nitroGLYCERIN (NITROSTAT) 0.4 MG SL tablet Place 1 tablet (0.4 mg total) under the tongue every 5 (five) minutes as needed for chest pain.  Marland Kitchen omega-3 acid ethyl esters (LOVAZA) 1 g capsule Take 2 capsules (2 g total) by mouth 2 (two) times daily.  . potassium chloride (KLOR-CON 10) 10 MEQ tablet Take 2 tablets (20 mEq total) by mouth daily as needed (with every 40mg  dose of Lasix).  . sacubitril-valsartan (ENTRESTO) 24-26 MG Take 1 tablet by mouth 2 (two) times daily.  Marland Kitchen spironolactone (ALDACTONE) 25 MG tablet Take 0.5 tablets (12.5 mg total) by mouth daily.  . tamsulosin (FLOMAX) 0.4 MG CAPS capsule TAKE 1 CAPSULE DAILY  . [DISCONTINUED] amiodarone (PACERONE) 200 MG tablet Take 1 tablet (200 mg total) by mouth daily.  . [DISCONTINUED] aspirin 81 MG EC tablet Take 1 tablet (81 mg total) by mouth at bedtime. Swallow whole.  . [DISCONTINUED] hydrOXYzine (ATARAX/VISTARIL) 25 MG tablet Take 1-2 tablets (25-50 mg total) by mouth 3 (three) times daily as needed for anxiety.     Allergies:   Sulfa antibiotics   Social History   Socioeconomic History  . Marital status:  Married    Spouse name: Not on file  . Number of children: Not on file  . Years of education: 75  . Highest education level: Doctorate  Occupational History  . Not on file  Tobacco Use  . Smoking status: Former Smoker    Types: Cigarettes  . Smokeless tobacco: Never Used  Vaping Use  . Vaping Use: Never used  Substance and Sexual Activity  . Alcohol use: Yes    Alcohol/week: 3.0 standard drinks    Types: 3 Standard drinks or equivalent per week    Comment: 1 drink 3 times per week  . Drug use: No  . Sexual activity: Yes  Other Topics Concern  . 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 Difficulty of Paying Living Expenses: Not hard at all  Food Insecurity:   . Worried About Charity fundraiser in the Last Year: Not on file  . Ran Out of Food in the Last Year: Not on file  Transportation Needs:   . Lack of Transportation (Medical): Not on file  . Lack of Transportation (Non-Medical): Not on file  Physical Activity:   . Days of Exercise per Week: Not on file  . Minutes of Exercise per Session: Not on file  Stress:   . Feeling of Stress : Not on file  Social Connections:   . Frequency of Communication with Friends and Family: Not on file  . Frequency of Social Gatherings with Friends and Family: Not on file  . Attends Religious Services: Not on file  . Active Member of Clubs or Organizations: Not on file  . Attends Archivist Meetings: Not on file  . Marital Status: Not on file     Family History: The patient's family history includes Dementia in his father. There is no history of Cancer.  ROS:   Please see the history of present illness.    He is somewhat perturbed that A. fib has recurred.  He is also having difficulty with  stiffness and pain related to arthritis and inability to use nonsteroidal anti-inflammatory agents.  He also likes to have a drink of alcohol or wine several times per week.  We discussed this again.  Both nonsteroidals and alcohol increased risk of bleeding on anticoagulation therapy.  He has not had a recurrence of angina.  All other systems reviewed and are negative.  EKGs/Labs/Other Studies Reviewed:    The following studies were reviewed today:  Electrical Cardioversion 11/08/2019:  CV Procedure by [name], MD at 11/08/2019 11:08 AM  Author: [name], MD Service: Cardiology Author Type: Physician  Filed: 11/08/2019 11:09 AM Date of Service: 11/08/2019 11:08 AM Status: Signed  Editor: [name], MD (Physician)   [facility]: Anesthesia:  Propofol Dr  Robert No missed doses of eliquis   [place] x 4 150 J then 200 J x 3 last with manual AP compression   Finally converted to NSR  No immediate neurologic sequelae  Jenkins Rouge MD [facility]        ECHOCARDIOGRAM 09/2019: IMPRESSIONS    1. Poor quality study despite contrast. EF 35-40% with severe HK of the  posterior wall. Moderate to severe MR related with eccentric jet that is  poorly quantitated. Would strongly consider TEE.  2. Left ventricular ejection fraction, by estimation, is 35 to 40%. The  left ventricle has moderately decreased function. The left ventricle  demonstrates regional wall motion abnormalities (see scoring  diagram/findings for description). Left ventricular  diastolic function could not be evaluated.  3. Right ventricular systolic function is normal. The right ventricular  size is normal. There is normal pulmonary artery systolic pressure. The  estimated right ventricular systolic pressure is 38.2 mmHg.  4. Left atrial size was moderately dilated.  5. Right atrial size was mildly dilated.  6. There is moderate to severe MR that is poorly quantitated, posteriorly  directed, and  eccentric. Suspect this is related to restricted movement of  the PMVL is systole due to posterior wall hypokinesis (IIIB pathology).  There were poor acoustic windows  for TTE as well. Would consider a TEE for  better evaluation of the  pathology if clinically indicated. The mitral valve is abnormal. Moderate  to severe mitral valve regurgitation. No evidence of mitral stenosis.  7. The aortic valve is tricuspid. Aortic valve regurgitation is not  visualized. Mild aortic valve sclerosis is present, with no evidence of  aortic valve stenosis.  8. The inferior vena cava is normal in size with greater than 50%  respiratory variability, suggesting right atrial pressure of 3 mmHg.    TE ECHOCARDIOGRAPHY September 13, 2019:  IMPRESSIONS  1. Left ventricular ejection fraction, by estimation, is 40 to 45%. The  left ventricle has mildly decreased function. Left ventricular endocardial  border not optimally defined to evaluate regional wall motion.  2. Right ventricular systolic function is normal. The right ventricular  size is normal.  3. A left atrial/left atrial appendage thrombus was detected.  4. Moderate to severe regurgitation; severe by ERO and MR radius,  moderate by Rvol. Unable to pulse PV as he was not tolerating procedure  well at the time and expedited images were obtained. There appears to be a  ruptured chord and partial prolapsed  leaflet of A2. Taken together, highly suggestive of severe eccentric MR..  The mitral valve is degenerative. Moderate to severe mitral valve  regurgitation.  5. The aortic valve is tricuspid. Aortic valve regurgitation is mild. No  aortic stenosis is present.  6. There is Moderate (Grade III) plaque involving the descending aorta.   Conclusion(s)/Recommendation(s): Likely severe MR given measurements and  eccentric jet. There is thrombus of the left atrial appendage, so  cardioversion not attempted.   EKG:  EKG rhythm strips from today's  cardiac rehab program recorded at 11:15 AM demonstrates atrial fibrillation with controlled rate in the 70 to 85 bpm range.  Recent Labs: 09/08/2019: B Natriuretic Peptide 264.7 09/11/2019: Magnesium 1.8 09/30/2019: ALT 42; TSH 2.990 11/01/2019: BUN 29; Creatinine, Ser 1.38; Hemoglobin 14.3; Platelets 143; Potassium 4.8; Sodium 142  Recent Lipid Panel    Component Value Date/Time   CHOL 135 09/08/2019 1254   TRIG 85 09/08/2019 1254   HDL 42 09/08/2019 1254   CHOLHDL 3.2 09/08/2019 1254   VLDL 17 09/08/2019 1254   LDLCALC 76 09/08/2019 1254   LDLDIRECT 76.0 06/22/2019 1503    Physical Exam:    VS:  BP (!) 110/58   Pulse 68   Ht 5\' 8"  (1.727 m)   Wt 240 lb (108.9 kg)   SpO2 99%   BMI 36.49 kg/m     Wt Readings from Last 3 Encounters:  11/17/19 240 lb (108.9 kg)  11/08/19 242 lb 1 oz (109.8 kg)  11/01/19 242 lb (109.8 kg)     GEN: Abdominal obesity with BMI 37. No acute distress HEENT: Normal NECK: No JVD. LYMPHATICS: No lymphadenopathy CARDIAC: Irregularly irregular RR without murmur, gallop, or edema. VASCULAR:  Normal Pulses. No bruits. RESPIRATORY:  Clear to auscultation without rales, wheezing or rhonchi  ABDOMEN: Soft, non-tender, non-distended, No pulsatile mass, MUSCULOSKELETAL: No deformity  SKIN: Warm and dry NEUROLOGIC:  Alert and oriented x 3 PSYCHIATRIC:  Normal affect   ASSESSMENT:    1. Persistent atrial fibrillation ([place])   2. Coronary artery disease involving coronary bypass graft of native heart with unstable angina pectoris ([place])   3. On amiodarone therapy   4. Essential hypertension   5. Chronic systolic heart failure ([place])   6. Type 2 diabetes mellitus without complication, without long-term current use of insulin ([facility])   7. Obstructive sleep apnea on CPAP  38. Educated about COVID-19 virus infection    PLAN:    In order of problems listed above:  1. Despite amiodarone loading followed by cardioversion, the patient has not been able to  maintain sinus rhythm.  Left atrial size by transthoracic echo is 5 cm.  He does have significant MR and perhaps the prior chronicity of atrial fibrillation and hemodynamic stress from mitral regurg all the reason for early failure.  Will increase amiodarone back to 400 mg/day for 3 additional weeks.  Will now get help from colleagues in the mitral valve structural clinic to determine if the patient is a candidate for MitraClip.  He does not currently have symptoms of heart failure but does have reduced LV systolic function.  It appears that mitral regurgitation may be related to structural abnormality from ischemic heart disease with papillary muscle dysfunction.  Secondly, will have an EP evaluation to determine if attempts at achieving sinus rhythm are futile given underlying ischemic heart disease, current left atrial size, and mitral valve disease. 2. Secondary prevention reviewed 3. Increase amiodarone to 400 mg/day.  Consider repeat cardioversion if recommended by EP.  Consider ablation if recommended by EP.  Discontinue therapy if recommendation is to accept atrial fibrillation since the patient has few symptoms related to AF. 4. Continue carvedilol 6.25 mg twice daily, Entresto 24/26 mg daily, Aldactone 12.5 mg daily, and further up titration is clearly needed.  Blood pressure will be an issue as systolic is running 604 mmHg today. 5. Three-pronged therapy currently with MRA therapy/Arni/beta-blocker as noted above. 6. Consider adding SGLT2 therapy 7. Encouraged to be compliant with CPAP 8. Vaccinated and practicing social distancing   Medication Adjustments/Labs and Tests Ordered: Current medicines are reviewed at length with the patient today.  Concerns regarding medicines are outlined above.  Orders Placed This Encounter  Procedures  . Ambulatory referral to Cardiac Electrophysiology   Meds ordered this encounter  Medications  . amiodarone (PACERONE) 200 MG tablet    Sig: Take 2  tablets by mouth daily until 12/09/19, then decrease to one tablet by mouth daily    Dispense:  103 tablet    Refill:  3    Dose change    Patient Instructions  Medication Instructions:  1) INCREASE Amiodarone to 400mg  once daily until 12/09/19, then decrease back down to 200mg  once daily.  *If you need a refill on your cardiac medications before your next appointment, please call your pharmacy*   Lab Work: None If you have labs (blood work) drawn today and your tests are completely normal, you will receive your results only by: Marland Kitchen MyChart Message (if you have MyChart) OR . A paper copy in the mail If you have any lab test that is abnormal or we need to change your treatment, we will call you to review the results.   Testing/Procedures: None   Follow-Up: At [facility], you and your health needs are our priority.  As part of our continuing mission to provide you with exceptional heart care, we have created designated Provider Care Teams.  These Care Teams include your primary Cardiologist (physician) and Advanced Practice Providers (APPs -  Physician Assistants and Nurse Practitioners) who all work together to provide you with the care you need, when you need it.  We recommend signing up for the patient portal called "MyChart".  Sign up information is provided on this After Visit Summary.  MyChart is used to connect with patients for Virtual Visits (Telemedicine).  Patients are able to  view lab/test results, encounter notes, upcoming appointments, etc.  Non-urgent messages can be sent to your provider as well.   To learn more about what you can do with MyChart, go to [email or web address].    Your next appointment:   2 month(s)  The format for your next appointment:   In Person  Provider:   You may see Sinclair Grooms, MD or one of the following Advanced Practice Providers on your designated Care Team:    Truitt Merle, NP  Cecilie Kicks, NP  Kathyrn Drown,  NP    Other Instructions  You have been referred to Dr. Burt Knack to discuss Mitral Clip.  You have been referred to our Electrophysiology Department.  Their schedule will be in contact to schedule you.      Signed, Sinclair Grooms, MD  11/17/2019 4:58 PM    [place]

## 2019-11-17 ENCOUNTER — Encounter: Payer: Self-pay | Admitting: Interventional Cardiology

## 2019-11-17 ENCOUNTER — Other Ambulatory Visit: Payer: Self-pay

## 2019-11-17 ENCOUNTER — Ambulatory Visit: Payer: Medicare (Managed Care) | Admitting: Interventional Cardiology

## 2019-11-17 ENCOUNTER — Encounter (HOSPITAL_COMMUNITY)
Admission: RE | Admit: 2019-11-17 | Discharge: 2019-11-17 | Disposition: A | Discharge status: Home / Self Care | Payer: Medicare (Managed Care) | Source: Ambulatory Visit | Attending: Interventional Cardiology | Admitting: Interventional Cardiology

## 2019-11-17 VITALS — BP 110/58 | HR 68 | Ht 68.0 in | Wt 240.0 lb

## 2019-11-17 DIAGNOSIS — Z7189 Other specified counseling: Secondary | ICD-10-CM

## 2019-11-17 DIAGNOSIS — I25700 Atherosclerosis of coronary artery bypass graft(s), unspecified, with unstable angina pectoris: Secondary | ICD-10-CM

## 2019-11-17 DIAGNOSIS — I4819 Other persistent atrial fibrillation: Secondary | ICD-10-CM | POA: Diagnosis not present

## 2019-11-17 DIAGNOSIS — Z955 Presence of coronary angioplasty implant and graft: Secondary | ICD-10-CM

## 2019-11-17 DIAGNOSIS — I340 Nonrheumatic mitral (valve) insufficiency: Secondary | ICD-10-CM | POA: Diagnosis not present

## 2019-11-17 DIAGNOSIS — I5022 Chronic systolic (congestive) heart failure: Secondary | ICD-10-CM

## 2019-11-17 DIAGNOSIS — G4733 Obstructive sleep apnea (adult) (pediatric): Secondary | ICD-10-CM

## 2019-11-17 DIAGNOSIS — E119 Type 2 diabetes mellitus without complications: Secondary | ICD-10-CM

## 2019-11-17 DIAGNOSIS — I214 Non-ST elevation (NSTEMI) myocardial infarction: Secondary | ICD-10-CM

## 2019-11-17 DIAGNOSIS — Z79899 Other long term (current) drug therapy: Secondary | ICD-10-CM | POA: Diagnosis not present

## 2019-11-17 DIAGNOSIS — I10 Essential (primary) hypertension: Secondary | ICD-10-CM

## 2019-11-17 DIAGNOSIS — Z9989 Dependence on other enabling machines and devices: Secondary | ICD-10-CM

## 2019-11-17 MED ORDER — AMIODARONE HCL 200 MG PO TABS: ORAL_TABLET | ORAL | 3 refills | Status: DC

## 2019-11-17 NOTE — Patient Instructions (Signed)
Medication Instructions:  1) INCREASE Amiodarone to 400mg  once daily until 12/09/19, then decrease back down to 200mg  once daily.  *If you need a refill on your cardiac medications before your next appointment, please call your pharmacy*   Lab Work: None If you have labs (blood work) drawn today and your tests are completely normal, you will receive your results only by: Marland Kitchen MyChart Message (if you have MyChart) OR . A paper copy in the mail If you have any lab test that is abnormal or we need to change your treatment, we will call you to review the results.   Testing/Procedures: None   Follow-Up: At [facility], you and your health needs are our priority.  As part of our continuing mission to provide you with exceptional heart care, we have created designated Provider Care Teams.  These Care Teams include your primary Cardiologist (physician) and Advanced Practice Providers (APPs -  Physician Assistants and Nurse Practitioners) who all work together to provide you with the care you need, when you need it.  We recommend signing up for the patient portal called "MyChart".  Sign up information is provided on this After Visit Summary.  MyChart is used to connect with patients for Virtual Visits (Telemedicine).  Patients are able to view lab/test results, encounter notes, upcoming appointments, etc.  Non-urgent messages can be sent to your provider as well.   To learn more about what you can do with MyChart, go to [email or web address].    Your next appointment:   2 month(s)  The format for your next appointment:   In Person  Provider:   You may see Sinclair Grooms, MD or one of the following Advanced Practice Providers on your designated Care Team:    Truitt Merle, NP  Cecilie Kicks, NP  Kathyrn Drown, NP    Other Instructions  You have been referred to Dr. Burt Knack to discuss Mitral Clip.  You have been referred to our Electrophysiology Department.  Their schedule will  be in contact to schedule you.

## 2019-11-17 NOTE — Progress Notes (Signed)
Patient appears to be back in Atrial Fibrillation at a controlled rate 78. Sao2 98% on room air. Exit exercise BP 98/60. Patient asymptomatic. Angie Duke PA paged and notified. No new orders received. Medications reviewed. Patient taking as prescribed. Falon was given a copy of today's ECG tracings and exercise flow sheets for Dr Tamala Julian to review at his appointment this afternoon.Barnet Pall, RN,BSN 11/17/2019 12:29 PM

## 2019-11-19 ENCOUNTER — Other Ambulatory Visit: Payer: Self-pay

## 2019-11-19 ENCOUNTER — Other Ambulatory Visit: Payer: Self-pay | Admitting: Family Medicine

## 2019-11-19 ENCOUNTER — Encounter (HOSPITAL_COMMUNITY)
Admission: RE | Admit: 2019-11-19 | Discharge: 2019-11-19 | Disposition: A | Discharge status: Home / Self Care | Payer: Medicare (Managed Care) | Source: Ambulatory Visit | Attending: Interventional Cardiology | Admitting: Interventional Cardiology

## 2019-11-19 DIAGNOSIS — Z955 Presence of coronary angioplasty implant and graft: Secondary | ICD-10-CM | POA: Diagnosis not present

## 2019-11-19 DIAGNOSIS — I214 Non-ST elevation (NSTEMI) myocardial infarction: Secondary | ICD-10-CM

## 2019-11-19 MED ORDER — LEVOTHYROXINE SODIUM 75 MCG PO TABS
75.0000 ug | ORAL_TABLET | Freq: Every day | ORAL | 1 refills | Status: DC
Start: 2019-11-19 — End: 2020-06-09

## 2019-11-22 ENCOUNTER — Encounter (HOSPITAL_COMMUNITY)
Admission: RE | Admit: 2019-11-22 | Discharge: 2019-11-22 | Disposition: A | Discharge status: Home / Self Care | Payer: Medicare (Managed Care) | Source: Ambulatory Visit | Attending: Interventional Cardiology | Admitting: Interventional Cardiology

## 2019-11-22 ENCOUNTER — Other Ambulatory Visit: Payer: Self-pay

## 2019-11-22 DIAGNOSIS — I214 Non-ST elevation (NSTEMI) myocardial infarction: Secondary | ICD-10-CM

## 2019-11-22 DIAGNOSIS — Z955 Presence of coronary angioplasty implant and graft: Secondary | ICD-10-CM

## 2019-11-24 ENCOUNTER — Encounter (HOSPITAL_COMMUNITY): Payer: Medicare (Managed Care)

## 2019-11-26 ENCOUNTER — Other Ambulatory Visit: Payer: Self-pay

## 2019-11-26 ENCOUNTER — Encounter (HOSPITAL_COMMUNITY)
Admission: RE | Admit: 2019-11-26 | Discharge: 2019-11-26 | Disposition: A | Discharge status: Home / Self Care | Payer: Medicare (Managed Care) | Source: Ambulatory Visit | Attending: Interventional Cardiology | Admitting: Interventional Cardiology

## 2019-11-26 DIAGNOSIS — Z955 Presence of coronary angioplasty implant and graft: Secondary | ICD-10-CM

## 2019-11-26 DIAGNOSIS — I214 Non-ST elevation (NSTEMI) myocardial infarction: Secondary | ICD-10-CM

## 2019-11-29 ENCOUNTER — Encounter (HOSPITAL_COMMUNITY): Payer: Medicare (Managed Care)

## 2019-11-29 ENCOUNTER — Encounter: Payer: Self-pay | Admitting: Cardiovascular Disease

## 2019-11-29 ENCOUNTER — Encounter (HOSPITAL_COMMUNITY)
Admission: RE | Admit: 2019-11-29 | Discharge: 2019-11-29 | Disposition: A | Discharge status: Home / Self Care | Payer: Medicare (Managed Care) | Source: Ambulatory Visit | Attending: Interventional Cardiology | Admitting: Interventional Cardiology

## 2019-11-29 ENCOUNTER — Other Ambulatory Visit: Payer: Self-pay

## 2019-11-29 ENCOUNTER — Ambulatory Visit: Payer: Medicare (Managed Care) | Admitting: Cardiovascular Disease

## 2019-11-29 VITALS — BP 106/68 | HR 56 | Ht 68.0 in | Wt 241.0 lb

## 2019-11-29 DIAGNOSIS — Z955 Presence of coronary angioplasty implant and graft: Secondary | ICD-10-CM

## 2019-11-29 DIAGNOSIS — I214 Non-ST elevation (NSTEMI) myocardial infarction: Secondary | ICD-10-CM

## 2019-11-29 DIAGNOSIS — I340 Nonrheumatic mitral (valve) insufficiency: Secondary | ICD-10-CM

## 2019-11-29 NOTE — H&P (View-Only) (Signed)
[facility]   MULTIDISCIPLINARY HEART VALVE TEAM  Date:  12/01/2019   ID:  Raymond Christensen, DOB 11-Mar-1940, MRN [id number]  PCP:  [name], DO   Chief Complaint  Patient presents with  . Mitral Regurgitation     HISTORY OF PRESENT ILLNESS: Raymond Christensen is a 79 y.o. male who presents for evaluation of mitral regurgitation, referred by Dr Tamala Julian.   He has a long history of ischemic heart disease, after initially presenting with an acute MI complicated by cardiac arrest in 1994. He ultimately underwent multivessel CABG in 2001. Comorbid medical problems include carotid stenosis s/p carotid endarterectomy, diabetes, HTN, obesity, and obstructive sleep apnea. He was diagnosed with atrial fibrillation in August 2021 when he was hospitalized with NSTEMI. He underwent vein graft PCI of the sequential SVG-OM and PDA, treated with drug eluting stents. He underwent TEE which demonstrated moderately severe MR with A2 partial flail segment with ruptured chordae tendinae. LAA thrombus was demonstrated so he did not undergo DCCV at that time. He was subsequently loaded with oral amiodarone and ultimately underwent cardioversion but reverted back to atrial fibrillation at follow-up. He is referred for structural heart consultation for treatment options related to mitral regurgitation.  The patient is here with his wife today. He is participating in cardiac rehab. He denies chest pain, orthopnea, or PND  He can perform his activities with cardiac rehab without cardiac limitation. He has mild dyspnea associated with moderate physical activity, but no symptoms with ADL's. Complains of generalized fatigue and mild exercise intolerance at present.   The patient has not had regular dental care and reports no problems.   Past Medical History:  Diagnosis Date  . Arthritis   . CAD (coronary artery disease)   . Concussion Summer 2012  . Essential hypertension 02/26/2017  . Heart  disease   . Hyperlipidemia   . Hypothyroidism 10/15/2017  . Major depressive disorder   . Obstructive sleep apnea on CPAP   . Poor flexibility of tendon 12/01/2018  . Subungual hematoma of digit of hand 12/01/2018  . Subungual hematoma of right foot 12/01/2018  . Thyroid disease   . Type 2 diabetes mellitus without complication, without long-term current use of insulin ([place]) 02/26/2017    Current Outpatient Medications  Medication Sig Dispense Refill  . amiodarone (PACERONE) 200 MG tablet Take 2 tablets by mouth daily until 12/09/19, then decrease to one tablet by mouth daily 103 tablet 3  . apixaban (ELIQUIS) 5 MG TABS tablet Take 1 tablet (5 mg total) by mouth 2 (two) times daily. 180 tablet 3  . atorvastatin (LIPITOR) 80 MG tablet Take 1 tablet (80 mg total) by mouth daily. 90 tablet 1  . carvedilol (COREG) 6.25 MG tablet Take 1 tablet (6.25 mg total) by mouth 2 (two) times daily with a meal. 180 tablet 3  . Cholecalciferol (VITAMIN D3) 250 MCG (10000 UT) capsule Take 10,000 Units by mouth daily.     . clopidogrel (PLAVIX) 75 MG tablet Take 1 tablet (75 mg total) by mouth daily with breakfast. 30 tablet 3  . colchicine 0.6 MG tablet Take 1 tablet (0.6 mg total) by mouth daily as needed. 90 tablet 1  . Cyanocobalamin (VITAMIN B-12 PO) Take 1,000 mcg by mouth daily.     Marland Kitchen ezetimibe (ZETIA) 10 MG tablet Take 1 tablet (10 mg total) by mouth daily. 90 tablet 1  . famotidine (PEPCID) 20 MG tablet Take 1 tablet (20 mg total) by mouth daily. 30 tablet  3  . finasteride (PROSCAR) 5 MG tablet TAKE 1 TABLET DAILY 90 tablet 1  . furosemide (LASIX) 40 MG tablet Take 1 tablet (40 mg total) by mouth daily. 90 tablet 3  . levocetirizine (XYZAL) 5 MG tablet TAKE 1 TABLET EVERY EVENING 90 tablet 1  . levothyroxine (SYNTHROID) 75 MCG tablet Take 1 tablet (75 mcg total) by mouth daily. 90 tablet 1  . liothyronine (CYTOMEL) 5 MCG tablet TAKE 2 TABLETS DAILY 180 tablet 3  . Multiple Vitamins-Minerals  (MULTIVITAMIN ADULT EXTRA C PO) Take 1 tablet by mouth daily.     . nitroGLYCERIN (NITROSTAT) 0.4 MG SL tablet Place 1 tablet (0.4 mg total) under the tongue every 5 (five) minutes as needed for chest pain. 30 tablet 12  . omega-3 acid ethyl esters (LOVAZA) 1 g capsule Take 2 capsules (2 g total) by mouth 2 (two) times daily. 360 capsule 2  . potassium chloride (KLOR-CON 10) 10 MEQ tablet Take 2 tablets (20 mEq total) by mouth daily as needed (with every 40mg  dose of Lasix). 90 tablet 1  . sacubitril-valsartan (ENTRESTO) 24-26 MG Take 1 tablet by mouth 2 (two) times daily. 180 tablet 3  . spironolactone (ALDACTONE) 25 MG tablet Take 0.5 tablets (12.5 mg total) by mouth daily. 135 tablet 3  . tamsulosin (FLOMAX) 0.4 MG CAPS capsule TAKE 1 CAPSULE DAILY 90 capsule 3   No current facility-administered medications for this visit.    ALLERGIES:   Sulfa antibiotics   SOCIAL HISTORY:  The patient  reports that he has quit smoking. His smoking use included cigarettes. He has never used smokeless tobacco. He reports current alcohol use of about 3.0 standard drinks of alcohol per week. He reports that he does not use drugs.   FAMILY HISTORY:  The patient's family history includes Dementia in his father.   REVIEW OF SYSTEMS:  Positive for fatigue.   All other systems are reviewed and negative.   PHYSICAL EXAM: VS:  BP 106/68   Pulse (!) 56   Ht 5\' 8"  (1.727 m)   Wt 241 lb (109.3 kg)   SpO2 98%   BMI 36.64 kg/m  , BMI Body mass index is 36.64 kg/m. GEN: Well nourished, well developed, pleasant elderly male in no acute distress HEENT: normal Neck: No JVD. carotids 2+ without bruits or masses Cardiac: The heart is irregular with a 3/6 holosystolic murmur at the apex/axilla.  No edema. Pedal pulses 2+ = bilaterally  Respiratory:  clear to auscultation bilaterally GI: soft, nontender, nondistended, + BS MS: no deformity or atrophy Skin: warm and dry, no rash Neuro:  Strength and sensation are  intact Psych: euthymic mood, full affect  RECENT LABS: 09/08/2019: B Natriuretic Peptide 264.7 09/11/2019: Magnesium 1.8 09/30/2019: ALT 42; TSH 2.990 11/01/2019: BUN 29; Creatinine, Ser 1.38; Hemoglobin 14.3; Platelets 143; Potassium 4.8; Sodium 142  06/22/2019: Direct LDL 76.0 09/08/2019: Cholesterol 135; HDL 42; LDL Cholesterol 76; Total CHOL/HDL Ratio 3.2; Triglycerides 85; VLDL 17   CrCl cannot be calculated (Patient's most recent lab result is older than the maximum 21 days allowed.).   Wt Readings from Last 3 Encounters:  11/29/19 241 lb (109.3 kg)  11/17/19 240 lb (108.9 kg)  11/08/19 242 lb 1 oz (109.8 kg)     CARDIAC STUDIES:  Cardiac Cath:  Conclusion    Mid LM to Dist LM lesion is 70% stenosed.  1st Mrg lesion is 75% stenosed.  2nd Mrg-1 lesion is 90% stenosed.  2nd Mrg-2 lesion is 95% stenosed.  Mid RCA to Dist RCA lesion is 100% stenosed.  Prox RCA to Mid RCA lesion is 90% stenosed.  2nd Diag lesion is 75% stenosed.  Origin to Prox Graft lesion between 2nd Mrg and RPDA is 99% stenosed.  Prox Graft to Mid Graft lesion between Ramus and 2nd Mrg is 75% stenosed.  A drug-eluting stent was successfully placed using a STENT RESOLUTE ONYX 3.0X18.  Post intervention, there is a 0% residual stenosis.  A drug-eluting stent was successfully placed.  Post intervention, there is a 0% residual stenosis.  Hemodynamic findings consistent with mitral valve regurgitation.   Raymond Christensen is a 79 y.o. male CARDIAC CATHETERIZATION / PCI DES SVG Om2-PDA    History obtained from chart review.Raymond Simonsis a 79 y.o.malewith a history of [id number] c/b cardiac arrest s/p lytics and PTCA, CABGin2001 (LIMA to LAD, SVG to OM-2, SVG to right PDA, SVG to D-2) with post-op AF, carotidstenosiss/p L CEA and right subclavian stenosis,diet-controlledDM,HTN,obesity, OSA on CPAP, mild cognitive impairment, depression/mood disorder,and hypothyroidism.LV function  seems to be preserved based on available records.He is being seen today for the evaluation of1 week of epigastric discomfort.  His troponins were elevated at 300.  His EKG showed atrial fibrillation.  2D echo revealed decline in his ejection fraction of 35% with moderate to severe MR.  Based on this was decided to proceed with right and left heart cath to further define his anatomy and physiology with anticipation of potentially needing transesophageal echocardiography to better characterize his mitral valve.  IMPRESSION: Mr. Vanderwall has an occluded RCA with an occluded second diagonal branch vein graft at the aorta and high-grade disease in a sequential vein graft supplying the second obtuse marginal branch and PDA.  His filling pressures are fairly low.  I placed 2 sequential drug-eluting stents with excellent result.  It is unclear whether his MR is structural or functional but I suspect he may require a transesophageal echo to further evaluate.  The sheath will be removed once the ACT falls below 170 and pressure held.  He will be gently hydrated.  He will need to be treated with an oral anticoagulant for his A. fib as well as aspirin and Plavix for 30 days after which the aspirin can be discontinued.  He will need at least 12 months of Plavix and long-term oral anticoagulation.  Diagnostic Dominance: Right Left Main  Mid LM to Dist LM lesion is 70% stenosed.  Left Anterior Descending  Second Diagonal Branch  2nd Diag lesion is 75% stenosed.  Left Circumflex  First Obtuse Marginal Branch  1st Mrg lesion is 75% stenosed.  Second Obtuse Marginal Branch  2nd Mrg-1 lesion is 90% stenosed.  2nd Mrg-2 lesion is 95% stenosed.  Right Coronary Artery  Prox RCA to Mid RCA lesion is 90% stenosed.  Mid RCA to Dist RCA lesion is 100% stenosed.  LIMA Graft To Dist LAD  Sequential Graft To Ramus, 2nd Mrg, RPDA  Prox Graft to Mid Graft lesion between Ramus and 2nd Mrg is 75% stenosed.  Origin to Prox  Graft lesion between 2nd Mrg and RPDA is 99% stenosed.  Intervention  Prox Graft to Mid Graft lesion between Ramus and 2nd Mrg (Sequential Graft To Ramus, 2nd Mrg, RPDA)  Stent  Lesion crossed with guidewire. Pre-stent angioplasty was not performed. A drug-eluting stent was successfully placed. Stent does not overlap previously placed stentPost-stent angioplasty was not performed.  Post-Intervention Lesion Assessment  The intervention was successful. Pre-interventional TIMI flow is 3. Post-intervention TIMI flow is  3. No complications occurred at this lesion.  There is a 0% residual stenosis post intervention.  Origin to Prox Graft lesion between 2nd Mrg and RPDA (Sequential Graft To Ramus, 2nd Mrg, RPDA)  Stent  Pre-stent angioplasty was performed. A drug-eluting stent was successfully placed using a STENT RESOLUTE ONYX 3.0X18. Stent strut is well apposed. Stent does not overlap previously placed stentPost-stent angioplasty was not performed.  Post-Intervention Lesion Assessment  The intervention was successful. Pre-interventional TIMI flow is 3. Post-intervention TIMI flow is 3. No complications occurred at this lesion.  There is a 0% residual stenosis post intervention.  Right Heart  Right Heart Pressures Hemodynamic findings consistent with mitral valve regurgitation. Right atrial pressure-mean 6 Right ventricular pressure-33/6 Pulmonary artery pressure-37/18, mean 27 Pulmonary wedge pressure-V wave 12, mean 12 Cardiac output was 5.8 L/min with an index of 2.63 L/min/m by Fick, 4.7 L/min with an index of 2.16 L/min/m by thermodilution LVEDP-9  Coronary Diagrams  Diagnostic Dominance: Right  Intervention    TEE: IMPRESSIONS    1. Left ventricular ejection fraction, by estimation, is 40 to 45%. The  left ventricle has mildly decreased function. Left ventricular endocardial  border not optimally defined to evaluate regional wall motion.  2. Right ventricular systolic  function is normal. The right ventricular  size is normal.  3. A left atrial/left atrial appendage thrombus was detected.  4. Moderate to severe regurgitation; severe by ERO and MR radius,  moderate by Rvol. Unable to pulse PV as he was not tolerating procedure  well at the time and expedited images were obtained. There appears to be a  ruptured chord and partial prolapsed  leaflet of A2. Taken together, highly suggestive of severe eccentric MR..  The mitral valve is degenerative. Moderate to severe mitral valve  regurgitation.  5. The aortic valve is tricuspid. Aortic valve regurgitation is mild. No  aortic stenosis is present.  6. There is Moderate (Grade III) plaque involving the descending aorta.   Conclusion(s)/Recommendation(s): Likely severe MR given measurements and  eccentric jet. There is thrombus of the left atrial appendage, so  cardioversion not attempted.   FINDINGS  Left Ventricle: Left ventricular ejection fraction, by estimation, is 40  to 45%. The left ventricle has mildly decreased function. Left ventricular  endocardial border not optimally defined to evaluate regional wall motion.  The left ventricular internal  cavity size was normal in size. There is no left ventricular hypertrophy.   Right Ventricle: The right ventricular size is normal. No increase in  right ventricular wall thickness. Right ventricular systolic function is  normal.   Left Atrium: There is echodensity in the left atrial appendage, separate  from artifact from the coumadin ridge. This is best seen on biplane in  multiple views. Therefore cardioversion not performed. Left atrial size  was not assessed. A left atrial/left  atrial appendage thrombus was detected.   Right Atrium: Right atrial size was not assessed.   Pericardium: There is no evidence of pericardial effusion.   Mitral Valve: Moderate to severe regurgitation; severe by ERO and MR  radius, moderate by Rvol. Unable to  pulse PV as he was not tolerating  procedure well at the time and expedited images were obtained. There  appears to be a ruptured chord and partial  prolapsed leaflet of A2. Taken together, highly suggestive of severe  eccentric MR. The mitral valve is degenerative in appearance. Moderate to  severe mitral valve regurgitation. There is no evidence of mitral valve  vegetation.  Tricuspid Valve: The tricuspid valve is normal in structure. Tricuspid  valve regurgitation is mild . No evidence of tricuspid stenosis. There is  no evidence of tricuspid valve vegetation.   Aortic Valve: The aortic valve is tricuspid. Aortic valve regurgitation is  mild. No aortic stenosis is present. There is no evidence of aortic valve  vegetation.   Pulmonic Valve: The pulmonic valve was grossly normal. Pulmonic valve  regurgitation is mild. There is no evidence of pulmonic valve vegetation.   Aorta: The aortic root is normal in size and structure. There is moderate  (Grade III) plaque involving the descending aorta.   IAS/Shunts: No atrial level shunt detected by color flow Doppler.   STS RISK CALCULATOR: Isolated MVR: Risk of Mortality: 6.379% Renal Failure: 6.599% Permanent Stroke: 1.347% Prolonged Ventilation: 21.137% DSW Infection: 0.308% Reoperation: 4.840% Morbidity or Mortality: 26.047% Short Length of Stay: 14.363% Long Length of Stay: 10.659%  Isolated MV Repair: Risk of Mortality: 6.379% Renal Failure: 6.599% Permanent Stroke: 1.347% Prolonged Ventilation: 21.137% DSW Infection: 0.308% Reoperation: 4.840% Morbidity or Mortality: 26.047% Short Length of Stay: 14.363% Long Length of Stay: 10.659%  ASSESSMENT AND PLAN: 13.  79 yo male with severe, symptomatic mitral regurgitation with known comorbidities including recent NSTEMI, reduced LV systolic function, prior CABG, persistent afib after recent amiodarone loading and DCCV, and stage 3 CKD. He has NYHA II  symptoms of chronic systolic heart failure. The patient's cath and TEE studies are reviewed. TEE shows evidence of a flail P2 segment with torn chordae tendinae and severe eccentric mitral regurgitation (Primary MR).   I have reviewed the natural history of mitral regurgitation with the patient and their family members who are present today. We have discussed the limitations of medical therapy and the poor prognosis associated with symptomatic mitral regurgitation. We have also reviewed potential treatment options, including palliative medical therapy, conventional surgical mitral valve repair or replacement, and percutaneous mitral valve therapies such as edge-to-edge mitral valve approximation with MitraClip. We discussed treatment options in the context of this patient's specific comorbid medical conditions. Important comorbid medical conditions include ischemic heart disease with prior CABG and recent NSTEMI, persistent atrial fibrillation, and Type II diabetes. He is pending formal EP evaluation. Considerations for management of his atrial fibrillation include repeat cardioversion after mitral valve repair, ablation, surgical Maze, and rate-control.   The patient is counseled specifically about the risks, indications, and alternatives to percutaneous mitral valve repair with MitraClip. Specific risks include vascular injury, bleeding, infection, arrhythmia, myocardial infarction, stroke, cardiac perforation, cardiac tamponade, device embolization, single leaflet detachment, endocarditis, mitral valve injury, emergency surgery, and death. They understand the risk of serious complication occurs at a rate of approximately 2%. The patient is interested in pursuing transcatheter edge-to-edge mitral valve repair. As next steps, he will undergo formal cardiac surgical consultation with a mitral valve surgeon as part of a multidisciplinary approach to his care.   Deatra James 12/01/2019 9:45 PM      [address] [place] [address]  [phone] (office) [phone] (fax)

## 2019-11-29 NOTE — Patient Instructions (Signed)
Medication Instructions:  Your provider recommends that you continue on your current medications as directed. Please refer to the Current Medication list given to you today.   *If you need a refill on your cardiac medications before your next appointment, please call your pharmacy*   Follow-Up: Please keep your appointment with Dr. Curt Bears.  Lauren, the Structural Heart Nurse Navigator, will be in touch to arrange an appointment with Dr. Roxy Manns. [facility] [id number]  [phone]  Your tentative date for procedure will be 12/23/2019.

## 2019-11-29 NOTE — Progress Notes (Signed)
[facility]   MULTIDISCIPLINARY HEART VALVE TEAM  Date:  12/01/2019   ID:  Raymond Christensen, DOB 1940-05-16, MRN [id number]  PCP:  [name], DO   Chief Complaint  Patient presents with  . Mitral Regurgitation     HISTORY OF PRESENT ILLNESS: Raymond Christensen is a 79 y.o. male who presents for evaluation of mitral regurgitation, referred by Dr Tamala Julian.   He has a long history of ischemic heart disease, after initially presenting with an acute MI complicated by cardiac arrest in 1994. He ultimately underwent multivessel CABG in 2001. Comorbid medical problems include carotid stenosis s/p carotid endarterectomy, diabetes, HTN, obesity, and obstructive sleep apnea. He was diagnosed with atrial fibrillation in August 2021 when he was hospitalized with NSTEMI. He underwent vein graft PCI of the sequential SVG-OM and PDA, treated with drug eluting stents. He underwent TEE which demonstrated moderately severe MR with A2 partial flail segment with ruptured chordae tendinae. LAA thrombus was demonstrated so he did not undergo DCCV at that time. He was subsequently loaded with oral amiodarone and ultimately underwent cardioversion but reverted back to atrial fibrillation at follow-up. He is referred for structural heart consultation for treatment options related to mitral regurgitation.  The patient is here with his wife today. He is participating in cardiac rehab. He denies chest pain, orthopnea, or PND  He can perform his activities with cardiac rehab without cardiac limitation. He has mild dyspnea associated with moderate physical activity, but no symptoms with ADL's. Complains of generalized fatigue and mild exercise intolerance at present.   The patient has not had regular dental care and reports no problems.   Past Medical History:  Diagnosis Date  . Arthritis   . CAD (coronary artery disease)   . Concussion Summer 2012  . Essential hypertension 02/26/2017  . Heart  disease   . Hyperlipidemia   . Hypothyroidism 10/15/2017  . Major depressive disorder   . Obstructive sleep apnea on CPAP   . Poor flexibility of tendon 12/01/2018  . Subungual hematoma of digit of hand 12/01/2018  . Subungual hematoma of right foot 12/01/2018  . Thyroid disease   . Type 2 diabetes mellitus without complication, without long-term current use of insulin ([facility]) 02/26/2017    Current Outpatient Medications  Medication Sig Dispense Refill  . amiodarone (PACERONE) 200 MG tablet Take 2 tablets by mouth daily until 12/09/19, then decrease to one tablet by mouth daily 103 tablet 3  . apixaban (ELIQUIS) 5 MG TABS tablet Take 1 tablet (5 mg total) by mouth 2 (two) times daily. 180 tablet 3  . atorvastatin (LIPITOR) 80 MG tablet Take 1 tablet (80 mg total) by mouth daily. 90 tablet 1  . carvedilol (COREG) 6.25 MG tablet Take 1 tablet (6.25 mg total) by mouth 2 (two) times daily with a meal. 180 tablet 3  . Cholecalciferol (VITAMIN D3) 250 MCG (10000 UT) capsule Take 10,000 Units by mouth daily.     . clopidogrel (PLAVIX) 75 MG tablet Take 1 tablet (75 mg total) by mouth daily with breakfast. 30 tablet 3  . colchicine 0.6 MG tablet Take 1 tablet (0.6 mg total) by mouth daily as needed. 90 tablet 1  . Cyanocobalamin (VITAMIN B-12 PO) Take 1,000 mcg by mouth daily.     Marland Kitchen ezetimibe (ZETIA) 10 MG tablet Take 1 tablet (10 mg total) by mouth daily. 90 tablet 1  . famotidine (PEPCID) 20 MG tablet Take 1 tablet (20 mg total) by mouth daily. 30 tablet  3  . finasteride (PROSCAR) 5 MG tablet TAKE 1 TABLET DAILY 90 tablet 1  . furosemide (LASIX) 40 MG tablet Take 1 tablet (40 mg total) by mouth daily. 90 tablet 3  . levocetirizine (XYZAL) 5 MG tablet TAKE 1 TABLET EVERY EVENING 90 tablet 1  . levothyroxine (SYNTHROID) 75 MCG tablet Take 1 tablet (75 mcg total) by mouth daily. 90 tablet 1  . liothyronine (CYTOMEL) 5 MCG tablet TAKE 2 TABLETS DAILY 180 tablet 3  . Multiple Vitamins-Minerals  (MULTIVITAMIN ADULT EXTRA C PO) Take 1 tablet by mouth daily.     . nitroGLYCERIN (NITROSTAT) 0.4 MG SL tablet Place 1 tablet (0.4 mg total) under the tongue every 5 (five) minutes as needed for chest pain. 30 tablet 12  . omega-3 acid ethyl esters (LOVAZA) 1 g capsule Take 2 capsules (2 g total) by mouth 2 (two) times daily. 360 capsule 2  . potassium chloride (KLOR-CON 10) 10 MEQ tablet Take 2 tablets (20 mEq total) by mouth daily as needed (with every 40mg  dose of Lasix). 90 tablet 1  . sacubitril-valsartan (ENTRESTO) 24-26 MG Take 1 tablet by mouth 2 (two) times daily. 180 tablet 3  . spironolactone (ALDACTONE) 25 MG tablet Take 0.5 tablets (12.5 mg total) by mouth daily. 135 tablet 3  . tamsulosin (FLOMAX) 0.4 MG CAPS capsule TAKE 1 CAPSULE DAILY 90 capsule 3   No current facility-administered medications for this visit.    ALLERGIES:   Sulfa antibiotics   SOCIAL HISTORY:  The patient  reports that he has quit smoking. His smoking use included cigarettes. He has never used smokeless tobacco. He reports current alcohol use of about 3.0 standard drinks of alcohol per week. He reports that he does not use drugs.   FAMILY HISTORY:  The patient's family history includes Dementia in his father.   REVIEW OF SYSTEMS:  Positive for fatigue.   All other systems are reviewed and negative.   PHYSICAL EXAM: VS:  BP 106/68   Pulse (!) 56   Ht 5\' 8"  (1.727 m)   Wt 241 lb (109.3 kg)   SpO2 98%   BMI 36.64 kg/m  , BMI Body mass index is 36.64 kg/m. GEN: Well nourished, well developed, pleasant elderly male in no acute distress HEENT: normal Neck: No JVD. carotids 2+ without bruits or masses Cardiac: The heart is irregular with a 3/6 holosystolic murmur at the apex/axilla.  No edema. Pedal pulses 2+ = bilaterally  Respiratory:  clear to auscultation bilaterally GI: soft, nontender, nondistended, + BS MS: no deformity or atrophy Skin: warm and dry, no rash Neuro:  Strength and sensation are  intact Psych: euthymic mood, full affect  RECENT LABS: 09/08/2019: B Natriuretic Peptide 264.7 09/11/2019: Magnesium 1.8 09/30/2019: ALT 42; TSH 2.990 11/01/2019: BUN 29; Creatinine, Ser 1.38; Hemoglobin 14.3; Platelets 143; Potassium 4.8; Sodium 142  06/22/2019: Direct LDL 76.0 09/08/2019: Cholesterol 135; HDL 42; LDL Cholesterol 76; Total CHOL/HDL Ratio 3.2; Triglycerides 85; VLDL 17   CrCl cannot be calculated (Patient's most recent lab result is older than the maximum 21 days allowed.).   Wt Readings from Last 3 Encounters:  11/29/19 241 lb (109.3 kg)  11/17/19 240 lb (108.9 kg)  11/08/19 242 lb 1 oz (109.8 kg)     CARDIAC STUDIES:  Cardiac Cath:  Conclusion    Mid LM to Dist LM lesion is 70% stenosed.  1st Mrg lesion is 75% stenosed.  2nd Mrg-1 lesion is 90% stenosed.  2nd Mrg-2 lesion is 95% stenosed.  Mid RCA to Dist RCA lesion is 100% stenosed.  Prox RCA to Mid RCA lesion is 90% stenosed.  2nd Diag lesion is 75% stenosed.  Origin to Prox Graft lesion between 2nd Mrg and RPDA is 99% stenosed.  Prox Graft to Mid Graft lesion between Ramus and 2nd Mrg is 75% stenosed.  A drug-eluting stent was successfully placed using a STENT RESOLUTE ONYX 3.0X18.  Post intervention, there is a 0% residual stenosis.  A drug-eluting stent was successfully placed.  Post intervention, there is a 0% residual stenosis.  Hemodynamic findings consistent with mitral valve regurgitation.   Raymond Christensen is a 79 y.o. male CARDIAC CATHETERIZATION / PCI DES SVG Om2-PDA    History obtained from chart review.Raymond Simonsis a 79 y.o.malewith a history of [id number] c/b cardiac arrest s/p lytics and PTCA, CABGin2001 (LIMA to LAD, SVG to OM-2, SVG to right PDA, SVG to D-2) with post-op AF, carotidstenosiss/p L CEA and right subclavian stenosis,diet-controlledDM,HTN,obesity, OSA on CPAP, mild cognitive impairment, depression/mood disorder,and hypothyroidism.LV function  seems to be preserved based on available records.He is being seen today for the evaluation of1 week of epigastric discomfort.  His troponins were elevated at 300.  His EKG showed atrial fibrillation.  2D echo revealed decline in his ejection fraction of 35% with moderate to severe MR.  Based on this was decided to proceed with right and left heart cath to further define his anatomy and physiology with anticipation of potentially needing transesophageal echocardiography to better characterize his mitral valve.  IMPRESSION: Raymond Christensen has an occluded RCA with an occluded second diagonal branch vein graft at the aorta and high-grade disease in a sequential vein graft supplying the second obtuse marginal branch and PDA.  His filling pressures are fairly low.  I placed 2 sequential drug-eluting stents with excellent result.  It is unclear whether his MR is structural or functional but I suspect he may require a transesophageal echo to further evaluate.  The sheath will be removed once the ACT falls below 170 and pressure held.  He will be gently hydrated.  He will need to be treated with an oral anticoagulant for his A. fib as well as aspirin and Plavix for 30 days after which the aspirin can be discontinued.  He will need at least 12 months of Plavix and long-term oral anticoagulation.  Diagnostic Dominance: Right Left Main  Mid LM to Dist LM lesion is 70% stenosed.  Left Anterior Descending  Second Diagonal Branch  2nd Diag lesion is 75% stenosed.  Left Circumflex  First Obtuse Marginal Branch  1st Mrg lesion is 75% stenosed.  Second Obtuse Marginal Branch  2nd Mrg-1 lesion is 90% stenosed.  2nd Mrg-2 lesion is 95% stenosed.  Right Coronary Artery  Prox RCA to Mid RCA lesion is 90% stenosed.  Mid RCA to Dist RCA lesion is 100% stenosed.  LIMA Graft To Dist LAD  Sequential Graft To Ramus, 2nd Mrg, RPDA  Prox Graft to Mid Graft lesion between Ramus and 2nd Mrg is 75% stenosed.  Origin to Prox  Graft lesion between 2nd Mrg and RPDA is 99% stenosed.  Intervention  Prox Graft to Mid Graft lesion between Ramus and 2nd Mrg (Sequential Graft To Ramus, 2nd Mrg, RPDA)  Stent  Lesion crossed with guidewire. Pre-stent angioplasty was not performed. A drug-eluting stent was successfully placed. Stent does not overlap previously placed stentPost-stent angioplasty was not performed.  Post-Intervention Lesion Assessment  The intervention was successful. Pre-interventional TIMI flow is 3. Post-intervention TIMI flow is  3. No complications occurred at this lesion.  There is a 0% residual stenosis post intervention.  Origin to Prox Graft lesion between 2nd Mrg and RPDA (Sequential Graft To Ramus, 2nd Mrg, RPDA)  Stent  Pre-stent angioplasty was performed. A drug-eluting stent was successfully placed using a STENT RESOLUTE ONYX 3.0X18. Stent strut is well apposed. Stent does not overlap previously placed stentPost-stent angioplasty was not performed.  Post-Intervention Lesion Assessment  The intervention was successful. Pre-interventional TIMI flow is 3. Post-intervention TIMI flow is 3. No complications occurred at this lesion.  There is a 0% residual stenosis post intervention.  Right Heart  Right Heart Pressures Hemodynamic findings consistent with mitral valve regurgitation. Right atrial pressure-mean 6 Right ventricular pressure-33/6 Pulmonary artery pressure-37/18, mean 27 Pulmonary wedge pressure-V wave 12, mean 12 Cardiac output was 5.8 L/min with an index of 2.63 L/min/m by Fick, 4.7 L/min with an index of 2.16 L/min/m by thermodilution LVEDP-9  Coronary Diagrams  Diagnostic Dominance: Right  Intervention    TEE: IMPRESSIONS    1. Left ventricular ejection fraction, by estimation, is 40 to 45%. The  left ventricle has mildly decreased function. Left ventricular endocardial  border not optimally defined to evaluate regional wall motion.  2. Right ventricular systolic  function is normal. The right ventricular  size is normal.  3. A left atrial/left atrial appendage thrombus was detected.  4. Moderate to severe regurgitation; severe by ERO and MR radius,  moderate by Rvol. Unable to pulse PV as he was not tolerating procedure  well at the time and expedited images were obtained. There appears to be a  ruptured chord and partial prolapsed  leaflet of A2. Taken together, highly suggestive of severe eccentric MR..  The mitral valve is degenerative. Moderate to severe mitral valve  regurgitation.  5. The aortic valve is tricuspid. Aortic valve regurgitation is mild. No  aortic stenosis is present.  6. There is Moderate (Grade III) plaque involving the descending aorta.   Conclusion(s)/Recommendation(s): Likely severe MR given measurements and  eccentric jet. There is thrombus of the left atrial appendage, so  cardioversion not attempted.   FINDINGS  Left Ventricle: Left ventricular ejection fraction, by estimation, is 40  to 45%. The left ventricle has mildly decreased function. Left ventricular  endocardial border not optimally defined to evaluate regional wall motion.  The left ventricular internal  cavity size was normal in size. There is no left ventricular hypertrophy.   Right Ventricle: The right ventricular size is normal. No increase in  right ventricular wall thickness. Right ventricular systolic function is  normal.   Left Atrium: There is echodensity in the left atrial appendage, separate  from artifact from the coumadin ridge. This is best seen on biplane in  multiple views. Therefore cardioversion not performed. Left atrial size  was not assessed. A left atrial/left  atrial appendage thrombus was detected.   Right Atrium: Right atrial size was not assessed.   Pericardium: There is no evidence of pericardial effusion.   Mitral Valve: Moderate to severe regurgitation; severe by ERO and MR  radius, moderate by Rvol. Unable to  pulse PV as he was not tolerating  procedure well at the time and expedited images were obtained. There  appears to be a ruptured chord and partial  prolapsed leaflet of A2. Taken together, highly suggestive of severe  eccentric MR. The mitral valve is degenerative in appearance. Moderate to  severe mitral valve regurgitation. There is no evidence of mitral valve  vegetation.  Tricuspid Valve: The tricuspid valve is normal in structure. Tricuspid  valve regurgitation is mild . No evidence of tricuspid stenosis. There is  no evidence of tricuspid valve vegetation.   Aortic Valve: The aortic valve is tricuspid. Aortic valve regurgitation is  mild. No aortic stenosis is present. There is no evidence of aortic valve  vegetation.   Pulmonic Valve: The pulmonic valve was grossly normal. Pulmonic valve  regurgitation is mild. There is no evidence of pulmonic valve vegetation.   Aorta: The aortic root is normal in size and structure. There is moderate  (Grade III) plaque involving the descending aorta.   IAS/Shunts: No atrial level shunt detected by color flow Doppler.   STS RISK CALCULATOR: Isolated MVR: Risk of Mortality: 6.379% Renal Failure: 6.599% Permanent Stroke: 1.347% Prolonged Ventilation: 21.137% DSW Infection: 0.308% Reoperation: 4.840% Morbidity or Mortality: 26.047% Short Length of Stay: 14.363% Long Length of Stay: 10.659%  Isolated MV Repair: Risk of Mortality: 6.379% Renal Failure: 6.599% Permanent Stroke: 1.347% Prolonged Ventilation: 21.137% DSW Infection: 0.308% Reoperation: 4.840% Morbidity or Mortality: 26.047% Short Length of Stay: 14.363% Long Length of Stay: 10.659%  ASSESSMENT AND PLAN: 34.  79 yo male with severe, symptomatic mitral regurgitation with known comorbidities including recent NSTEMI, reduced LV systolic function, prior CABG, persistent afib after recent amiodarone loading and DCCV, and stage 3 CKD. He has NYHA II  symptoms of chronic systolic heart failure. The patient's cath and TEE studies are reviewed. TEE shows evidence of a flail P2 segment with torn chordae tendinae and severe eccentric mitral regurgitation (Primary MR).   I have reviewed the natural history of mitral regurgitation with the patient and their family members who are present today. We have discussed the limitations of medical therapy and the poor prognosis associated with symptomatic mitral regurgitation. We have also reviewed potential treatment options, including palliative medical therapy, conventional surgical mitral valve repair or replacement, and percutaneous mitral valve therapies such as edge-to-edge mitral valve approximation with MitraClip. We discussed treatment options in the context of this patient's specific comorbid medical conditions. Important comorbid medical conditions include ischemic heart disease with prior CABG and recent NSTEMI, persistent atrial fibrillation, and Type II diabetes. He is pending formal EP evaluation. Considerations for management of his atrial fibrillation include repeat cardioversion after mitral valve repair, ablation, surgical Maze, and rate-control.   The patient is counseled specifically about the risks, indications, and alternatives to percutaneous mitral valve repair with MitraClip. Specific risks include vascular injury, bleeding, infection, arrhythmia, myocardial infarction, stroke, cardiac perforation, cardiac tamponade, device embolization, single leaflet detachment, endocarditis, mitral valve injury, emergency surgery, and death. They understand the risk of serious complication occurs at a rate of approximately 2%. The patient is interested in pursuing transcatheter edge-to-edge mitral valve repair. As next steps, he will undergo formal cardiac surgical consultation with a mitral valve surgeon as part of a multidisciplinary approach to his care.   Deatra James 12/01/2019 9:45 PM      [place]  [id number]  [phone] (office) [phone] (fax)

## 2019-12-01 ENCOUNTER — Encounter: Payer: Self-pay | Admitting: Cardiovascular Disease

## 2019-12-01 ENCOUNTER — Encounter (HOSPITAL_COMMUNITY)
Admission: RE | Admit: 2019-12-01 | Discharge: 2019-12-01 | Disposition: A | Discharge status: Home / Self Care | Payer: Medicare (Managed Care) | Source: Ambulatory Visit | Attending: Interventional Cardiology | Admitting: Interventional Cardiology

## 2019-12-01 ENCOUNTER — Other Ambulatory Visit: Payer: Self-pay

## 2019-12-01 DIAGNOSIS — Z955 Presence of coronary angioplasty implant and graft: Secondary | ICD-10-CM

## 2019-12-01 DIAGNOSIS — I214 Non-ST elevation (NSTEMI) myocardial infarction: Secondary | ICD-10-CM

## 2019-12-02 ENCOUNTER — Encounter: Payer: Self-pay | Admitting: Cardiology

## 2019-12-02 ENCOUNTER — Ambulatory Visit: Payer: Medicare (Managed Care) | Admitting: Cardiology

## 2019-12-02 VITALS — BP 100/60 | HR 77 | Ht 68.0 in | Wt 238.6 lb

## 2019-12-02 DIAGNOSIS — I4819 Other persistent atrial fibrillation: Secondary | ICD-10-CM

## 2019-12-02 NOTE — Progress Notes (Signed)
Electrophysiology Office Note   Date:  12/02/2019   ID:  Raymond Christensen, DOB [birth year], MRN [id number]  PCP:  [name], DO  Cardiologist:  Tamala Julian Primary Electrophysiologist:  Kamera Dubas Meredith [name], MD    Chief Complaint: AF   History of Present Illness: Raymond Christensen is a 79 y.o. male who is being seen today for the evaluation of AF at the request of Belva Crome, MD. Presenting today for electrophysiology evaluation.  He has a history of atrial fibrillation, moderate systolic heart failure, moderate to severe mitral regurgitation, and coronary artery disease status post CABG.  In 1994, he had a MI complicated by cardiac arrest.  He received lytics and PTCA.  He had a CABG in 2001x4 with postoperative atrial fibrillation.  He has carotid stenosis and is status post left CEA.  He has diabetes, hypertension, obesity, OSA on CPAP, mild cognitive impairment, and depression.  He had a cardioversion 11/08/2019.  He has been undergoing cardiac rehab.  After cardioversion, he did not have a dramatic improvement in exercise tolerance, but he has been working better at cardiac rehab.  He represented to cardiology clinic and was surprised to find that he was back in atrial fibrillation.  He has been referred to structural heart and has a plan for mitral valve clip.  His atrial fibrillation duration is unknown.  He was loaded on amiodarone, but was not able to maintain sinus rhythm.  After he was cardioverted, he felt a little bit less fatigue.  He did go back into atrial fibrillation what he feels was quite quickly.  Today, he denies symptoms of palpitations, chest pain, shortness of breath, orthopnea, PND, lower extremity edema, claudication, dizziness, presyncope, syncope, bleeding, or neurologic sequela. The patient is tolerating medications without difficulties.  He has mild shortness of breath and fatigue.  He says that he has difficulty when he tries to exert himself for long  periods of time.  His wife reiterates that he has been quite fatigued and tired over the past few months, and that he has been more irritable over that time.   Past Medical History:  Diagnosis Date  . Arthritis   . CAD (coronary artery disease)   . Concussion Summer 2012  . Essential hypertension 02/26/2017  . Heart disease   . Hyperlipidemia   . Hypothyroidism 10/15/2017  . Major depressive disorder   . Obstructive sleep apnea on CPAP   . Poor flexibility of tendon 12/01/2018  . Subungual hematoma of digit of hand 12/01/2018  . Subungual hematoma of right foot 12/01/2018  . Thyroid disease   . Type 2 diabetes mellitus without complication, without long-term current use of insulin ([place]) 02/26/2017   Past Surgical History:  Procedure Laterality Date  . BYPASS GRAFT  2001  . CARDIAC CATHETERIZATION    . CARDIOVERSION N/A 11/08/2019   Procedure: CARDIOVERSION;  Surgeon: [name], MD;  Location: [facility] ENDOSCOPY;  Service: Cardiovascular;  Laterality: N/A;  . CAROTID ARTERY ANGIOPLASTY    . RIGHT/LEFT HEART CATH AND CORONARY/GRAFT ANGIOGRAPHY N/A 09/09/2019   Procedure: RIGHT/LEFT HEART CATH AND CORONARY/GRAFT ANGIOGRAPHY;  Surgeon: [name], MD;  Location: [place] CV LAB;  Service: Cardiovascular;  Laterality: N/A;  . TEE WITHOUT CARDIOVERSION N/A 09/13/2019   Procedure: TRANSESOPHAGEAL ECHOCARDIOGRAM (TEE);  Surgeon: [name], MD;  Location: [facility] ENDOSCOPY;  Service: Cardiovascular;  Laterality: N/A;  . TOTAL HIP ARTHROPLASTY  1994, 1995     Current Outpatient Medications  Medication Sig Dispense Refill  .  amiodarone (PACERONE) 200 MG tablet Take 2 tablets by mouth daily until 12/09/19, then decrease to one tablet by mouth daily 103 tablet 3  . apixaban (ELIQUIS) 5 MG TABS tablet Take 1 tablet (5 mg total) by mouth 2 (two) times daily. 180 tablet 3  . atorvastatin (LIPITOR) 80 MG tablet Take 1 tablet (80 mg total) by mouth daily. 90 tablet 1  . carvedilol  (COREG) 6.25 MG tablet Take 1 tablet (6.25 mg total) by mouth 2 (two) times daily with a meal. 180 tablet 3  . Cholecalciferol (VITAMIN D3) 250 MCG (10000 UT) capsule Take 10,000 Units by mouth daily.     . clopidogrel (PLAVIX) 75 MG tablet Take 1 tablet (75 mg total) by mouth daily with breakfast. 30 tablet 3  . colchicine 0.6 MG tablet Take 1 tablet (0.6 mg total) by mouth daily as needed. 90 tablet 1  . Cyanocobalamin (VITAMIN B-12 PO) Take 1,000 mcg by mouth daily.     Marland Kitchen ezetimibe (ZETIA) 10 MG tablet Take 1 tablet (10 mg total) by mouth daily. 90 tablet 1  . famotidine (PEPCID) 20 MG tablet Take 1 tablet (20 mg total) by mouth daily. 30 tablet 3  . finasteride (PROSCAR) 5 MG tablet TAKE 1 TABLET DAILY 90 tablet 1  . furosemide (LASIX) 40 MG tablet Take 1 tablet (40 mg total) by mouth daily. 90 tablet 3  . levocetirizine (XYZAL) 5 MG tablet TAKE 1 TABLET EVERY EVENING 90 tablet 1  . levothyroxine (SYNTHROID) 75 MCG tablet Take 1 tablet (75 mcg total) by mouth daily. 90 tablet 1  . liothyronine (CYTOMEL) 5 MCG tablet TAKE 2 TABLETS DAILY 180 tablet 3  . Multiple Vitamins-Minerals (MULTIVITAMIN ADULT EXTRA C PO) Take 1 tablet by mouth daily.     . nitroGLYCERIN (NITROSTAT) 0.4 MG SL tablet Place 1 tablet (0.4 mg total) under the tongue every 5 (five) minutes as needed for chest pain. 30 tablet 12  . omega-3 acid ethyl esters (LOVAZA) 1 g capsule Take 2 capsules (2 g total) by mouth 2 (two) times daily. 360 capsule 2  . potassium chloride (KLOR-CON 10) 10 MEQ tablet Take 2 tablets (20 mEq total) by mouth daily as needed (with every 40mg  dose of Lasix). 90 tablet 1  . sacubitril-valsartan (ENTRESTO) 24-26 MG Take 1 tablet by mouth 2 (two) times daily. 180 tablet 3  . spironolactone (ALDACTONE) 25 MG tablet Take 0.5 tablets (12.5 mg total) by mouth daily. 135 tablet 3  . tamsulosin (FLOMAX) 0.4 MG CAPS capsule TAKE 1 CAPSULE DAILY 90 capsule 3   No current facility-administered medications for  this visit.    Allergies:   Sulfa antibiotics   Social History:  The patient  reports that he has quit smoking. His smoking use included cigarettes. He has never used smokeless tobacco. He reports current alcohol use of about 3.0 standard drinks of alcohol per week. He reports that he does not use drugs.   Family History:  The patient's family history includes Dementia in his father.    ROS:  Please see the history of present illness.   Otherwise, review of systems is positive for none.   All other systems are reviewed and negative.    PHYSICAL EXAM: VS:  BP 100/60   Pulse 77   Ht 5\' 8"  (1.727 m)   Wt 238 lb 9.6 oz (108.2 kg)   SpO2 96%   BMI 36.28 kg/m  , BMI Body mass index is 36.28 kg/m. GEN: Well nourished, well  developed, in no acute distress  HEENT: normal  Neck: no JVD, carotid bruits, or masses Cardiac: irregular; no murmurs, rubs, or gallops,no edema  Respiratory:  clear to auscultation bilaterally, normal work of breathing GI: soft, nontender, nondistended, + BS MS: no deformity or atrophy  Skin: warm and dry Neuro:  Strength and sensation are intact Psych: euthymic mood, full affect  EKG:  EKG is ordered today. Personal review of the ekg ordered shows atrial fibrillation, rate 77  Recent Labs: 09/08/2019: B Natriuretic Peptide 264.7 09/11/2019: Magnesium 1.8 09/30/2019: ALT 42; TSH 2.990 11/01/2019: BUN 29; Creatinine, Ser 1.38; Hemoglobin 14.3; Platelets 143; Potassium 4.8; Sodium 142    Lipid Panel     Component Value Date/Time   CHOL 135 09/08/2019 1254   TRIG 85 09/08/2019 1254   HDL 42 09/08/2019 1254   CHOLHDL 3.2 09/08/2019 1254   VLDL 17 09/08/2019 1254   LDLCALC 76 09/08/2019 1254   LDLDIRECT 76.0 06/22/2019 1503     Wt Readings from Last 3 Encounters:  12/02/19 238 lb 9.6 oz (108.2 kg)  11/29/19 241 lb (109.3 kg)  11/17/19 240 lb (108.9 kg)      Other studies Reviewed: Additional studies/ records that were reviewed today include: TEE  09/13/19  Review of the above records today demonstrates:  1. Left ventricular ejection fraction, by estimation, is 40 to 45%. The  left ventricle has mildly decreased function. Left ventricular endocardial  border not optimally defined to evaluate regional wall motion.  2. Right ventricular systolic function is normal. The right ventricular  size is normal.  3. A left atrial/left atrial appendage thrombus was detected.  4. Moderate to severe regurgitation; severe by ERO and MR radius,  moderate by Rvol. Unable to pulse PV as he was not tolerating procedure  well at the time and expedited images were obtained. There appears to be a  ruptured chord and partial prolapsed  leaflet of A2. Taken together, highly suggestive of severe eccentric MR..  The mitral valve is degenerative. Moderate to severe mitral valve  regurgitation.  5. The aortic valve is tricuspid. Aortic valve regurgitation is mild. No  aortic stenosis is present.  6. There is Moderate (Grade III) plaque involving the descending aorta.    RHC/LHC 09/10/19  Mid LM to Dist LM lesion is 70% stenosed.  1st Mrg lesion is 75% stenosed.  2nd Mrg-1 lesion is 90% stenosed.  2nd Mrg-2 lesion is 95% stenosed.  Mid RCA to Dist RCA lesion is 100% stenosed.  Prox RCA to Mid RCA lesion is 90% stenosed.  2nd Diag lesion is 75% stenosed.  Origin to Prox Graft lesion between 2nd Mrg and RPDA is 99% stenosed.  Prox Graft to Mid Graft lesion between Ramus and 2nd Mrg is 75% stenosed.  A drug-eluting stent was successfully placed using a STENT RESOLUTE ONYX 3.0X18.  Post intervention, there is a 0% residual stenosis.  A drug-eluting stent was successfully placed.  Post intervention, there is a 0% residual stenosis.  Hemodynamic findings consistent with mitral valve regurgitation.   ASSESSMENT AND PLAN:  1.  Persistent atrial fibrillation: Currently on amiodarone (monitoring for high risk medication), Eliquis, Coreg.  He  does have significant mitral regurgitation.  He has plans for mitral valve clip.  I have spoken with his structural cardiologist.  It seems that mitral valve clip prior to an attempt at resumption of sinus rhythm would likely be the most beneficial, as his mitral regurgitation may be contributing to his atrial fibrillation.  I  did speak with him about the possibility of ablation versus repeat cardioversion.  He is having symptoms of weakness and fatigue, though it is unclear to me as to how much atrial fibrillation is contributing to this.  Despite that, he would like to try and get back into normal rhythm after his mitral valve clip.  We did discuss both ablation and repeat cardioversion.  He would prefer ablation at this time.  2.  Coronary artery disease status post CABG: No current chest pain.  3.  Hypertension: Currently well controlled  4.  Chronic systolic heart failure due to ischemic cardiomyopathy: Currently on carvedilol, Aldactone, and Entresto.  No obvious volume overload.  Plan per primary cardiology.  5.  Obstructive sleep apnea: CPAP compliance encouraged  Case discussed with primary cardiology  Current medicines are reviewed at length with the patient today.   The patient does not have concerns regarding his medicines.  The following changes were made today:  none  Labs/ tests ordered today include:  Orders Placed This Encounter  Procedures  . EKG 12-Lead     Disposition:   FU with Kelsye Loomer 1 months  Signed, Cleopha Indelicato Meredith [name], MD  12/02/2019 11:54 AM     [facility] [address] [place] [address] [phone] (office) [phone] (fax)

## 2019-12-02 NOTE — Patient Instructions (Addendum)
Medication Instructions:  Your physician recommends that you continue on your current medications as directed. Please refer to the Current Medication list given to you today.  *If you need a refill on your cardiac medications before your next appointment, please call your pharmacy*   Lab Work: None ordered   Testing/Procedures: Will hold February 01, 2020 spot for ablation   Follow-Up: At [facility], you and your health needs are our priority.  As part of our continuing mission to provide you with exceptional heart care, we have created designated Provider Care Teams.  These Care Teams include your primary Cardiologist (physician) and Advanced Practice Providers (APPs -  Physician Assistants and Nurse Practitioners) who all work together to provide you with the care you need, when you need it.   Your next appointment:    01/13/20 at 9:30 am  The format for your next appointment:   In Person  Provider:   Allegra Lai, MD    Thank you for choosing [facility]!!   Trinidad Curet, RN [phone]   Other Instructions   Atrial Fibrillation  Atrial fibrillation is a type of irregular or rapid heartbeat (arrhythmia). In atrial fibrillation, the top part of the heart (atria) beats in an irregular pattern. This makes the heart unable to pump blood normally and effectively. The goal of treatment is to prevent blood clots from forming, control your heart rate, or restore your heartbeat to a normal rhythm. If this condition is not treated, it can cause serious problems, such as a weakened heart muscle (cardiomyopathy) or a stroke. What are the causes? This condition is often caused by medical conditions that damage the heart's electrical system. These include:  High blood pressure (hypertension). This is the most common cause.  Certain heart problems or conditions, such as heart failure, coronary artery disease, heart valve problems, or heart  surgery.  Diabetes.  Overactive thyroid (hyperthyroidism).  Obesity.  Chronic kidney disease. In some cases, the cause of this condition is not known. What increases the risk? This condition is more likely to develop in:  Older people.  People who smoke.  Athletes who do endurance exercise.  People who have a family history of atrial fibrillation.  Men.  People who use drugs.  People who drink a lot of alcohol.  People who have lung conditions, such as emphysema, pneumonia, or COPD.  People who have obstructive sleep apnea. What are the signs or symptoms? Symptoms of this condition include:  A feeling that your heart is racing or beating irregularly.  Discomfort or pain in your chest.  Shortness of breath.  Sudden light-headedness or weakness.  Tiring easily during exercise or activity.  Fatigue.  Syncope (fainting).  Sweating. In some cases, there are no symptoms. How is this diagnosed? Your health care provider may detect atrial fibrillation when taking your pulse. If detected, this condition may be diagnosed with:  An electrocardiogram (ECG) to check electrical signals of the heart.  An ambulatory cardiac monitor to record your heart's activity for a few days.  A transthoracic echocardiogram (TTE) to create pictures of your heart.  A transesophageal echocardiogram (TEE) to create even closer pictures of your heart.  A stress test to check your blood supply while you exercise.  Imaging tests, such as a CT scan or chest X-ray.  Blood tests. How is this treated? Treatment depends on underlying conditions and how you feel when you experience atrial fibrillation. This condition may be treated with:  Medicines to prevent blood clots  or to treat heart rate or heart rhythm problems.  Electrical cardioversion to reset the heart's rhythm.  A pacemaker to correct abnormal heart rhythm.  Ablation to remove the heart tissue that sends abnormal  signals.  Left atrial appendage closure to seal the area where blood clots can form. In some cases, underlying conditions will be treated. Follow these instructions at home: Medicines  Take over-the counter and prescription medicines only as told by your health care provider.  Do not take any new medicines without talking to your health care provider.  If you are taking blood thinners: ? Talk with your health care provider before you take any medicines that contain aspirin or NSAIDs, such as ibuprofen. These medicines increase your risk for dangerous bleeding. ? Take your medicine exactly as told, at the same time every day. ? Avoid activities that could cause injury or bruising, and follow instructions about how to prevent falls. ? Wear a medical alert bracelet or carry a card that lists what medicines you take. Lifestyle      Do not use any products that contain nicotine or tobacco, such as cigarettes, e-cigarettes, and chewing tobacco. If you need help quitting, ask your health care provider.  Eat heart-healthy foods. Talk with a dietitian to make an eating plan that is right for you.  Exercise regularly as told by your health care provider.  Do not drink alcohol.  Lose weight if you are overweight.  Do not use drugs, including cannabis. General instructions  If you have obstructive sleep apnea, manage your condition as told by your health care provider.  Do not use diet pills unless your health care provider approves. Diet pills can make heart problems worse.  Keep all follow-up visits as told by your health care provider. This is important. Contact a health care provider if you:  Notice a change in the rate, rhythm, or strength of your heartbeat.  Are taking a blood thinner and you notice more bruising.  Tire more easily when you exercise or do heavy work.  Have a sudden change in weight. Get help right away if you have:   Chest pain, abdominal pain, sweating,  or weakness.  Trouble breathing.  Side effects of blood thinners, such as blood in your vomit, stool, or urine, or bleeding that cannot stop.  Any symptoms of a stroke. "BE FAST" is an easy way to remember the main warning signs of a stroke: ? B - Balance. Signs are dizziness, sudden trouble walking, or loss of balance. ? E - Eyes. Signs are trouble seeing or a sudden change in vision. ? F - Face. Signs are sudden weakness or numbness of the face, or the face or eyelid drooping on one side. ? A - Arms. Signs are weakness or numbness in an arm. This happens suddenly and usually on one side of the body. ? S - Speech. Signs are sudden trouble speaking, slurred speech, or trouble understanding what people say. ? T - Time. Time to call emergency services. Write down what time symptoms started.  Other signs of a stroke, such as: ? A sudden, severe headache with no known cause. ? Nausea or vomiting. ? Seizure. These symptoms may represent a serious problem that is an emergency. Do not wait to see if the symptoms will go away. Get medical help right away. Call your local emergency services (911 in the [place] Do not drive yourself to the hospital. Summary  Atrial fibrillation is a type of irregular or rapid  heartbeat (arrhythmia).  Symptoms include a feeling that your heart is beating fast or irregularly.  You may be given medicines to prevent blood clots or to treat heart rate or heart rhythm problems.  Get help right away if you have signs or symptoms of a stroke.  Get help right away if you cannot catch your breath or have chest pain or pressure. This information is not intended to replace advice given to you by your health care provider. Make sure you discuss any questions you have with your health care provider. Document Revised: 06/24/2018 Document Reviewed: 06/24/2018 Elsevier Patient Education  [place].     Cardiac Ablation  Cardiac ablation is a procedure to stop  some heart tissue from causing problems. The heart has many electrical connections. Sometimes these connections make the heart beat very fast or irregularly. Removing some problem areas can improve the heart rhythm or make it normal. What happens before the procedure?  Follow instructions from your doctor about what you cannot eat or drink.  Ask your doctor about: ? Changing or stopping your normal medicines. This is important if you take diabetes medicines or blood thinners. ? Taking medicines such as aspirin and ibuprofen. These medicines can thin your blood. Do not take these medicines before your procedure if your doctor tells you not to.  Plan to have someone take you home.  If you will be going home right after the procedure, plan to have someone with you for 24 hours. What happens during the procedure?  To lower your risk of infection: ? Your health care team will wash or sanitize their hands. ? Your skin will be washed with soap. ? Hair may be removed from your neck or groin.  An IV tube will be put into one of your veins.  You will be given a medicine to help you relax (sedative).  Skin on your neck or groin will be numbed.  A cut (incision) will be made in your neck or groin.  A needle will be put through your cut and into a vein in your neck or groin.  A tube (catheter) will be put into the needle. The tube will be moved to your heart. X-rays (fluoroscopy) will be used to help guide the tube.  Small devices (electrodes) on the tip of the tube will send out electrical currents.  Dye may be put through the tube. This helps your surgeon see your heart.  Electrical energy will be used to scar (ablate) some heart tissue. Your surgeon may use: ? Heat (radiofrequency energy). ? Laser energy. ? Extreme cold (cryoablation).  The tube will be taken out.  Pressure will be held on your cut. This helps stop bleeding.  A bandage (dressing) will be put on your cut. The  procedure may vary. What happens after the procedure?  You will be monitored until your medicines have worn off.  Your cut will be watched for bleeding. You will need to lie still for a few hours.  Do not drive for 24 hours or as long as your doctor tells you. Summary  Cardiac ablation is a procedure to stop some heart tissue from causing problems.  Electrical energy will be used to scar (ablate) some heart tissue. This information is not intended to replace advice given to you by your health care provider. Make sure you discuss any questions you have with your health care provider. Document Revised: 12/13/2016 Document Reviewed: 11/20/2015 Elsevier Patient Education  2020 [employer].

## 2019-12-03 ENCOUNTER — Encounter: Payer: Self-pay | Admitting: Thoracic Surgery (Cardiothoracic Vascular Surgery)

## 2019-12-03 ENCOUNTER — Institutional Professional Consult (permissible substitution) (INDEPENDENT_AMBULATORY_CARE_PROVIDER_SITE_OTHER): Payer: Medicare (Managed Care) | Admitting: Thoracic Surgery (Cardiothoracic Vascular Surgery)

## 2019-12-03 ENCOUNTER — Encounter (HOSPITAL_COMMUNITY)
Admission: RE | Admit: 2019-12-03 | Discharge: 2019-12-03 | Disposition: A | Discharge status: Home / Self Care | Payer: Medicare (Managed Care) | Source: Ambulatory Visit | Attending: Interventional Cardiology | Admitting: Interventional Cardiology

## 2019-12-03 ENCOUNTER — Other Ambulatory Visit: Payer: Self-pay

## 2019-12-03 VITALS — BP 111/76 | HR 83 | Resp 18 | Ht 68.0 in | Wt 236.0 lb

## 2019-12-03 DIAGNOSIS — I5022 Chronic systolic (congestive) heart failure: Secondary | ICD-10-CM | POA: Diagnosis not present

## 2019-12-03 DIAGNOSIS — Z955 Presence of coronary angioplasty implant and graft: Secondary | ICD-10-CM

## 2019-12-03 DIAGNOSIS — I340 Nonrheumatic mitral (valve) insufficiency: Secondary | ICD-10-CM

## 2019-12-03 DIAGNOSIS — I214 Non-ST elevation (NSTEMI) myocardial infarction: Secondary | ICD-10-CM

## 2019-12-03 NOTE — Patient Instructions (Signed)
Continue all previous medications without any changes at this time  

## 2019-12-03 NOTE — Progress Notes (Signed)
HE[facility] AND [place] SURGERY CONSULTATION REPORT  Referring Provider is [name], MD Primary Cardiologist is Sinclair Grooms, MD PCP is Nani Ravens Crosby Oyster, DO  Chief Complaint  Patient presents with  . Mitral Regurgitation    surgical consult eval for Mitraclip    HPI:  Patient is a 79 year old moderately obese male with history of coronary artery disease and ischemic cardiomyopathy status post coronary artery bypass grafting in 3419, chronic systolic congestive heart failure, hypertension, obstructive sleep apnea, cerebrovascular disease with previous carotid endarterectomy, persistent atrial fibrillation that has failed cardioversion, type 2 diabetes mellitus without complication, and hypothyroidism who has been referred for surgical consultation to discuss treatment options for management of severe symptomatic mitral regurgitation.  Patient denies any previous history of heart murmur or mitral regurgitation until recently.  His cardiac history dates back to 1994 when he presented with an acute myocardial infarction complicated by cardiac arrest.  He received lytic therapy and underwent PTCA at the time.  In 2001 he underwent coronary artery bypass grafting x4.  Details are unclear as the patient lived in [place] at the time.  Reportedly he developed postoperative atrial fibrillation after his surgery which resolved.  Approximately 6 years ago the patient moved to [facility] and up until recently he has been followed by cardiologist at [facility].  Exercise stress echocardiogram performed 2010 revealed ejection fraction estimated 50 to 55% with no evidence of ischemia.  He was last seen by his cardiologist at [facility] in March of this year at which time he was clinically stable, remaining in sinus rhythm, and doing fairly well.  Patient was hospitalized acutely in August of this year with non-ST segment  elevation myocardial infarction.  He was noted to be in atrial fibrillation at that time.  He underwent diagnostic cardiac catheterization with PCI and stenting of a patent sequential saphenous vein graft to the OM branch and posterior descending coronary artery that had an isolated area of high-grade stenosis.  The patient did well but underwent TEE with plan cardioversion and was found to have moderately severe mitral regurgitation and left atrial thrombus.  Cardioversion was aborted and the patient was loaded with amiodarone and anticoagulated using Eliquis.  He recovered uneventfully and has been participating in the cardiac rehab program.  He later underwent DC cardioversion November 08, 2019 but had early recurrence of persistent atrial fibrillation.  He was referred to the multidisciplinary heart valve clinic and has been evaluated previously by Dr. Burt Knack.  He has also been evaluated by Dr. Curt Bears for possible elective A. fib ablation.  Cardiothoracic surgical consultation has been requested.  Patient is married and lives locally in [place] with his wife.  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  He lives a relatively sedentary lifestyle and does not currently attempt exercise at all on a regular basis.  He used to swim fairly regularly but he gave that up last spring.  He has been participating in the cardiac rehab program since his recent myocardial infarction.  He does remain functionally independent.  He states that he does not really get much shortness of breath but he notes that his exercise tolerance is nowhere near where it was prior to his heart attack and he gets tired very easily.  He denies any resting shortness of breath, PND, orthopnea, palpitations, dizzy spells, or syncope.  He has not had any chest pain or chest tightness since his myocardial infarction last August.  He states that he gets  occasional mild lower extremity edema.  Past Medical History:  Diagnosis Date  . Arthritis   . CAD (coronary artery disease)   . Concussion Summer 2012  . Essential hypertension 02/26/2017  . Heart disease   . Hyperlipidemia   . Hypothyroidism 10/15/2017  . Major depressive disorder   . Obstructive sleep apnea on CPAP   . Poor flexibility of tendon 12/01/2018  . Subungual hematoma of digit of hand 12/01/2018  . Subungual hematoma of right foot 12/01/2018  . Thyroid disease   . Type 2 diabetes mellitus without complication, without long-term current use of insulin ([place]) 02/26/2017    Past Surgical History:  Procedure Laterality Date  . BYPASS GRAFT  2001  . CARDIAC CATHETERIZATION    . CARDIOVERSION N/A 11/08/2019   Procedure: CARDIOVERSION;  Surgeon: [name], MD;  Location: [facility] ENDOSCOPY;  Service: Cardiovascular;  Laterality: N/A;  . CAROTID ARTERY ANGIOPLASTY    . RIGHT/LEFT HEART CATH AND CORONARY/GRAFT ANGIOGRAPHY N/A 09/09/2019   Procedure: RIGHT/LEFT HEART CATH AND CORONARY/GRAFT ANGIOGRAPHY;  Surgeon: [name], MD;  Location: [facility] CV LAB;  Service: Cardiovascular;  Laterality: N/A;  . TEE WITHOUT CARDIOVERSION N/A 09/13/2019   Procedure: TRANSESOPHAGEAL ECHOCARDIOGRAM (TEE);  Surgeon: [name], MD;  Location: [facility] ENDOSCOPY;  Service: Cardiovascular;  Laterality: N/A;  . TOTAL HIP ARTHROPLASTY  1994, 1995    Family History  Problem Relation Age of Onset  . Dementia Father        Concerns surrounding Lewy body dementia, but never officially diagnosed  . Cancer Neg Hx     Social History   Socioeconomic History  . Marital status: Married    Spouse name: Not on file  . Number of children: Not on file  . Years of education: 72  . Highest education level: Doctorate  Occupational History  . Not on file  Tobacco Use  . Smoking status: Former Smoker    Types: Cigarettes  . Smokeless tobacco: Never Used  Vaping Use  . Vaping Use:  Never used  Substance and Sexual Activity  . Alcohol use: Yes    Alcohol/week: 3.0 standard drinks    Types: 3 Standard drinks or equivalent per week    Comment: 1 drink 3 times per week  . Drug use: No  . Sexual activity: Yes  Other Topics Concern  . 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 Difficulty of Paying Living Expenses: Not hard at all  Food Insecurity:   . Worried About Charity fundraiser in the Last Year: Not on file  . Ran Out of Food in the Last Year: Not on file  Transportation Needs:   . Lack of Transportation (Medical): Not on file  . Lack of Transportation (Non-Medical): Not on file  Physical Activity:   . Days of Exercise per Week: Not on file  . Minutes of Exercise per Session: Not on file  Stress:   . Feeling of Stress : Not on file  Social Connections:   . Frequency of Communication with Friends and Family: Not  on file  . Frequency of Social Gatherings with Friends and Family: Not on file  . Attends Religious Services: Not on file  . Active Member of Clubs or Organizations: Not on file  . Attends Archivist Meetings: Not on file  . Marital Status: Not on file  Intimate Partner Violence:   . Fear of Current or Ex-Partner: Not on file  . Emotionally Abused: Not on file  . Physically Abused: Not on file  . Sexually Abused: Not on file    Current Outpatient Medications  Medication Sig Dispense Refill  . amiodarone (PACERONE) 200 MG tablet Take 2 tablets by mouth daily until 12/09/19, then decrease to one tablet by mouth daily 103 tablet 3  . apixaban (ELIQUIS) 5 MG TABS tablet Take 1 tablet (5 mg total) by mouth 2 (two) times daily. 180 tablet 3  . atorvastatin (LIPITOR) 80 MG tablet Take 1 tablet (80 mg total) by  mouth daily. 90 tablet 1  . carvedilol (COREG) 6.25 MG tablet Take 1 tablet (6.25 mg total) by mouth 2 (two) times daily with a meal. 180 tablet 3  . Cholecalciferol (VITAMIN D3) 250 MCG (10000 UT) capsule Take 10,000 Units by mouth daily.     . clopidogrel (PLAVIX) 75 MG tablet Take 1 tablet (75 mg total) by mouth daily with breakfast. 30 tablet 3  . colchicine 0.6 MG tablet Take 1 tablet (0.6 mg total) by mouth daily as needed. 90 tablet 1  . Cyanocobalamin (VITAMIN B-12 PO) Take 1,000 mcg by mouth daily.     Marland Kitchen ezetimibe (ZETIA) 10 MG tablet Take 1 tablet (10 mg total) by mouth daily. 90 tablet 1  . famotidine (PEPCID) 20 MG tablet Take 1 tablet (20 mg total) by mouth daily. 30 tablet 3  . finasteride (PROSCAR) 5 MG tablet TAKE 1 TABLET DAILY 90 tablet 1  . furosemide (LASIX) 40 MG tablet Take 1 tablet (40 mg total) by mouth daily. 90 tablet 3  . levocetirizine (XYZAL) 5 MG tablet TAKE 1 TABLET EVERY EVENING 90 tablet 1  . levothyroxine (SYNTHROID) 75 MCG tablet Take 1 tablet (75 mcg total) by mouth daily. 90 tablet 1  . liothyronine (CYTOMEL) 5 MCG tablet TAKE 2 TABLETS DAILY 180 tablet 3  . Multiple Vitamins-Minerals (MULTIVITAMIN ADULT EXTRA C PO) Take 1 tablet by mouth daily.     . nitroGLYCERIN (NITROSTAT) 0.4 MG SL tablet Place 1 tablet (0.4 mg total) under the tongue every 5 (five) minutes as needed for chest pain. 30 tablet 12  . omega-3 acid ethyl esters (LOVAZA) 1 g capsule Take 2 capsules (2 g total) by mouth 2 (two) times daily. 360 capsule 2  . potassium chloride (KLOR-CON 10) 10 MEQ tablet Take 2 tablets (20 mEq total) by mouth daily as needed (with every 14m dose of Lasix). 90 tablet 1  . sacubitril-valsartan (ENTRESTO) 24-26 MG Take 1 tablet by mouth 2 (two) times daily. 180 tablet 3  . spironolactone (ALDACTONE) 25 MG tablet Take 0.5 tablets (12.5 mg total) by mouth daily. 135 tablet 3  . tamsulosin (FLOMAX) 0.4 MG CAPS capsule TAKE 1 CAPSULE DAILY 90 capsule 3   No current  facility-administered medications for this visit.    Allergies  Allergen Reactions  . Sulfa Antibiotics Rash    Questionable, he developed a diffuse rash 2 days after stopping Bactrim      Review of Systems:   General:  normal appetite, decreased energy, no weight gain, no weight loss, no fever  Cardiac:  no chest pain with exertion, no chest pain at rest, +SOB with exertion, no resting SOB, no PND, no orthopnea, no palpitations, + arrhythmia, + atrial fibrillation, mild LE edema, no dizzy spells, no syncope  Respiratory:  no shortness of breath, no home oxygen, no productive cough, no dry cough, no bronchitis, no wheezing, no hemoptysis, no asthma, no pain with inspiration or cough, + sleep apnea, + CPAP at night  GI:   no difficulty swallowing, no reflux, no frequent heartburn, no hiatal hernia, no abdominal pain, no constipation, no diarrhea, no hematochezia, no hematemesis, no melena  GU:   no dysuria,  no frequency, no urinary tract infection, no hematuria, + enlarged prostate, no kidney stones, no kidney disease  Vascular:  no pain suggestive of claudication, no pain in feet, no leg cramps, no varicose veins, no DVT, no non-healing foot ulcer  Neuro:   no stroke, no TIA's, no seizures, no headaches, no temporary blindness one eye,  no slurred speech, no peripheral neuropathy, no chronic pain, no instability of gait, no memory/cognitive dysfunction  Musculoskeletal: + arthritis, no joint swelling, no myalgias, no difficulty walking, normal mobility   Skin:   no rash, no itching, no skin infections, no pressure sores or ulcerations  Psych:   no anxiety, no depression, no nervousness, no unusual recent stress  Eyes:   + blurry vision, no floaters, no recent vision changes, + wears glasses or contacts  ENT:   + hearing loss, no loose or painful teeth, no dentures, last saw dentist within the past 6 months  Hematologic:  + easy bruising, no abnormal bleeding, no clotting disorder, no  frequent epistaxis  Endocrine:  + diabetes, does not check CBG's at home           Physical Exam:   BP 111/76 (BP Location: Right Arm, Patient Position: Sitting)   Pulse 83   Resp 18   Ht 5' 8" (1.727 m)   Wt 236 lb (107 kg)   SpO2 96% Comment: RA with mask on  BMI 35.88 kg/m   General:  Moderately obese male NAD   HEENT:  Unremarkable   Neck:   no JVD, no bruits, no adenopathy   Chest:   clear to auscultation, symmetrical breath sounds, no wheezes, no rhonchi   CV:   RRR, grade II/VI systolic murmur heard best at LLSB,  no diastolic murmur  Abdomen:  soft, non-tender, no masses   Extremities:  warm, well-perfused, pulses not palpable, no LE edema  Rectal/GU  Deferred  Neuro:   Grossly non-focal and symmetrical throughout  Skin:   Clean and dry, no rashes, no breakdown   Diagnostic Tests:  ECHOCARDIOGRAM REPORT       Patient Name:  Raymond Christensen Date of Exam: 09/08/2019  Medical Rec #: [id number]   Height:    68.0 in  Accession #:  [id number]   Weight:    238.5 lb  Date of Birth: 1940/02/07   BSA:     2.203 m  Patient Age:  39 years    BP:      100/85 mmHg  Patient Gender: M       HR:      97 bpm.  Exam Location: Inpatient   Procedure: 2D Echo and Intracardiac Opacification Agent   Indications:  NSTEMI I21.4    History:    Patient has no prior history of Echocardiogram  examinations.         CAD; Risk Factors:Dyslipidemia and Diabetes.  Sonographer:  Mikki Santee RDCS (AE)  Referring Phys: [id number] RONDELL A SMITH   IMPRESSIONS    1. Poor quality study despite contrast. EF 35-40% with severe HK of the  posterior wall. Moderate to severe MR related with eccentric jet that is  poorly quantitated. Would strongly consider TEE.  2. Left ventricular ejection fraction, by estimation, is 35 to 40%. The  left ventricle has moderately decreased function. The left ventricle  demonstrates  regional wall motion abnormalities (see scoring  diagram/findings for description). Left ventricular  diastolic function could not be evaluated.  3. Right ventricular systolic function is normal. The right ventricular  size is normal. There is normal pulmonary artery systolic pressure. The  estimated right ventricular systolic pressure is 66.0 mmHg.  4. Left atrial size was moderately dilated.  5. Right atrial size was mildly dilated.  6. There is moderate to severe MR that is poorly quantitated, posteriorly  directed, and eccentric. Suspect this is related to restricted movement of  the PMVL is systole due to posterior wall hypokinesis (IIIB pathology).  There were poor acoustic windows  for TTE as well. Would consider a TEE for better evaluation of the  pathology if clinically indicated. The mitral valve is abnormal. Moderate  to severe mitral valve regurgitation. No evidence of mitral stenosis.  7. The aortic valve is tricuspid. Aortic valve regurgitation is not  visualized. Mild aortic valve sclerosis is present, with no evidence of  aortic valve stenosis.  8. The inferior vena cava is normal in size with greater than 50%  respiratory variability, suggesting right atrial pressure of 3 mmHg.   FINDINGS  Left Ventricle: Left ventricular ejection fraction, by estimation, is 35  to 40%. The left ventricle has moderately decreased function. The left  ventricle demonstrates regional wall motion abnormalities. Definity  contrast agent was given IV to delineate  the left ventricular endocardial borders. The left ventricular internal  cavity size was normal in size. There is no left ventricular hypertrophy.  Abnormal (paradoxical) septal motion consistent with post-operative  status. Left ventricular diastolic  function could not be evaluated due to atrial fibrillation. Left  ventricular diastolic function could not be evaluated.     LV Wall Scoring:  The posterior wall is  hypokinetic.   Right Ventricle: The right ventricular size is normal. No increase in  right ventricular wall thickness. Right ventricular systolic function is  normal. There is normal pulmonary artery systolic pressure. The tricuspid  regurgitant velocity is 2.40 m/s, and  with an assumed right atrial pressure of 3 mmHg, the estimated right  ventricular systolic pressure is 63.0 mmHg.   Left Atrium: Left atrial size was moderately dilated.   Right Atrium: Right atrial size was mildly dilated.   Pericardium: Trivial pericardial effusion is present.   Mitral Valve: There is moderate to severe MR that is poorly quantitated,  posteriorly directed, and eccentric. Suspect this is related to restricted  movement of the PMVL is systole due to posterior wall hypokinesis (IIIB  pathology). There were poor  acoustic windows for TTE as well. Would consider a TEE for better  evaluation of the pathology if clinically indicated. The mitral valve is  abnormal. Moderate to severe mitral valve regurgitation. No evidence of  mitral valve stenosis.   Tricuspid Valve: The tricuspid valve is grossly normal. Tricuspid valve  regurgitation is mild . No evidence of tricuspid stenosis.   Aortic Valve: The aortic valve is tricuspid. Aortic valve regurgitation is  not visualized. Mild aortic valve  sclerosis is present, with no evidence  of aortic valve stenosis.   Pulmonic Valve: The pulmonic valve was grossly normal. Pulmonic valve  regurgitation is trivial. No evidence of pulmonic stenosis.   Aorta: The aortic root and ascending aorta are structurally normal, with  no evidence of dilitation.   Venous: The inferior vena cava is normal in size with greater than 50%  respiratory variability, suggesting right atrial pressure of 3 mmHg.   IAS/Shunts: The atrial septum is grossly normal.     LEFT VENTRICLE  PLAX 2D  LVIDd:     5.08 cm  LVIDs:     3.36 cm  LV PW:     0.88 cm  LV IVS:     1.18 cm  LVOT diam:   2.30 cm  LV SV:     47  LV SV Index:  21  LVOT Area:   4.15 cm     RIGHT VENTRICLE  RV S prime:   8.27 cm/s  TAPSE (M-mode): 1.6 cm   LEFT ATRIUM       Index    RIGHT ATRIUM      Index  LA diam:    5.00 cm 2.27 cm/m RA Area:   23.50 cm  LA Vol (A2C):  96.9 ml 43.99 ml/m RA Volume:  63.30 ml 28.74 ml/m  LA Vol (A4C):  130.0 ml 59.02 ml/m  LA Biplane Vol: 116.0 ml 52.66 ml/m  AORTIC VALVE  LVOT Vmax:  61.30 cm/s  LVOT Vmean: 38.567 cm/s  LVOT VTI:  0.112 m    AORTA  Ao Root diam: 3.30 cm   MR Peak grad: 97.8 mmHg  TRICUSPID VALVE  MR Mean grad: 68.5 mmHg  TR Peak grad:  23.0 mmHg  MR Vmax:   494.50 cm/s TR Vmax:    240.00 cm/s  MR Vmean:   398.0 cm/s              SHUNTS              Systemic VTI: 0.11 m              Systemic Diam: 2.30 cm   Eleonore Chiquito MD  Electronically signed by Eleonore Chiquito MD  Signature Date/Time: 09/08/2019/3:34:47 PM    RIGHT/LEFT HEART CATH AND CORONARY/GRAFT ANGIOGRAPHY  Conclusion    Mid LM to Dist LM lesion is 70% stenosed.  1st Mrg lesion is 75% stenosed.  2nd Mrg-1 lesion is 90% stenosed.  2nd Mrg-2 lesion is 95% stenosed.  Mid RCA to Dist RCA lesion is 100% stenosed.  Prox RCA to Mid RCA lesion is 90% stenosed.  2nd Diag lesion is 75% stenosed.  Origin to Prox Graft lesion between 2nd Mrg and RPDA is 99% stenosed.  Prox Graft to Mid Graft lesion between Ramus and 2nd Mrg is 75% stenosed.  A drug-eluting stent was successfully placed using a STENT RESOLUTE ONYX 3.0X18.  Post intervention, there is a 0% residual stenosis.  A drug-eluting stent was successfully placed.  Post intervention, there is a 0% residual stenosis.  Hemodynamic findings consistent with mitral valve regurgitation.   Raymond Christensen is a 79 y.o. male    [id number] LOCATION:  FACILITY: [facility]  PHYSICIAN: Quay Burow, M.D. [birth year]   DATE OF PROCEDURE:  09/09/2019  DATE OF DISCHARGE:     CARDIAC CATHETERIZATION / PCI DES SVG Om2-PDA    History obtained from chart review.Raymond Christensen a 79 y.o.malewith a history of [id number] c/b cardiac arrest s/p lytics and PTCA, CABGin2001 (  LIMA to LAD, SVG to OM-2, SVG to right PDA, SVG to D-2) with post-op AF, carotidstenosiss/p L CEA and right subclavian stenosis,diet-controlledDM,HTN,obesity, OSA on CPAP, mild cognitive impairment, depression/mood disorder,and hypothyroidism.LV function seems to be preserved based on available records.He is being seen today for the evaluation of1 week of epigastric discomfort.  His troponins were elevated at 300.  His EKG showed atrial fibrillation.  2D echo revealed decline in his ejection fraction of 35% with moderate to severe MR.  Based on this was decided to proceed with right and left heart cath to further define his anatomy and physiology with anticipation of potentially needing transesophageal echocardiography to better characterize his mitral valve.   PROCEDURE DESCRIPTION:   The patient was brought to the second floor [facility] cath lab in the postabsorptive state. He was premedicated with IV Versed and fentanyl. His right groinwas prepped and shaved in usual sterile fashion. Xylocaine 1% was used for local anesthesia. A 5 French sheath was inserted into the right common femoral  artery using standard Seldinger technique.  A 7 French sheath was inserted into the right common femoral vein.  5 French right left Judkins diagnostic catheters along with a a 5 French pigtail catheter were used for selective coronary angiography, selective vein graft and IMA graft angiography, obtaining left heart pressures and supravalvular aortography.  Isovue dye was used for the entirety of the case.  Retrograde aorta, ventricular and pullback pressures were recorded.  A 7 [place] balloontipped  demolition Swan-Ganz catheter was advanced through the right heart chambers obtaining sequential pressures and blood samples for the determination of Fick and thermodilution cardiac outputs.  The patient received a total of 12,000 as of heparin with an ACT of greater than 300.  He received Plavix 600 mg p.o. along with Pepcid 20 mg IV.  On the pigtail was used for the entirety of the intervention.  Retrograde or pressures monitored during the case.  Using a 6 [place] RCB guide catheter along with a 0.14 [place]ater guidewire and a 2 mm x 12 mm balloon I was able to engage the ostium of the sequential vein graft and passed a wire across the tightest stenosis just beyond the first anastomosis to OM 2.  I predilated with a 2 mm x 12 mm balloon establishing antegrade flow.  I then placed a 3 mm x 18 mm long Medtronic Onyx resolute drug-eluting stent deployed at 14 atm (3.1 mm) resulting reduction of a 99% stenosis to 0% residual.  Following this I direct stented an area of an intraluminal filling defect that appeared to be calcium with a 2.75 mm x 16 mm long Synergy drug-eluting stent deployed at 14 atm (2.8 mm) resulting reduction of a 75% mid followed by 99% mid sequential SVG to OM 2 and PDA with excellent flow.  The patient tolerated procedure well.  The guidewire and catheter were removed and the sheaths were secured in place.    IMPRESSION: Raymond Christensen has an occluded RCA with an occluded second diagonal branch vein graft at the aorta and high-grade disease in a sequential vein graft supplying the second obtuse marginal branch and PDA.  His filling pressures are fairly low.  I placed 2 sequential drug-eluting stents with excellent result.  It is unclear whether his MR is structural or functional but I suspect he may require a transesophageal echo to further evaluate.  The sheath will be removed once the ACT falls below 170 and pressure held.  He will be gently hydrated.  He will need to be treated with an  oral anticoagulant for his A. fib as well as aspirin and Plavix for 30 days after which the aspirin can be discontinued.  He will need at least 12 months of Plavix and long-term oral anticoagulation.  Quay Burow. MD, [facility] 09/09/2019 11:41 AM     Recommendations  Antiplatelet/Anticoag Recommend to resume Apixaban, at currently prescribed dose and frequency. Recommend concurrent antiplatelet therapy of Aspirin 81 mg for 1 month and Clopidogrel 101m daily for 12 months .  Surgeon Notes    09/13/2019 1:01 PM CV Procedure signed by CBuford Dresser MD  Indications  Non-STEMI (non-ST elevated myocardial infarction) ([place] [I21.4 (ICD-10-CM)]  Hx of CABG [Z95.1 (ICD-10-CM)]  Longstanding persistent atrial fibrillation ([place] [I48.11 (ICD-10-CM)]  Procedural Details  Technical Details Estimated blood loss <50 mL.   During this procedure medications were administered to achieve and maintain moderate conscious sedation while the patient's heart rate, blood pressure, and oxygen saturation were continuously monitored and I was present face-to-face 100% of this time.  Medications (Filter: Administrations occurring from 0932 to 1120 on 09/09/19) (important) Continuous medications are totaled by the amount administered until 09/09/19 1120.  Heparin (Porcine) in NaCl 1000-0.9 UT/500ML-% SOLN (mL) Total volume:  1,000 mL Date/Time  Rate/Dose/Volume Action  09/09/19 0937  500 mL Given  0938  500 mL Given    fentaNYL (SUBLIMAZE) injection (mcg) Total dose:  25 mcg Date/Time  Rate/Dose/Volume Action  09/09/19 0938  25 mcg Given    midazolam (VERSED) injection (mg) Total dose:  1 mg Date/Time  Rate/Dose/Volume Action  09/09/19 0938  1 mg Given    lidocaine (PF) (XYLOCAINE) 1 % injection (mL) Total volume:  30 mL Date/Time  Rate/Dose/Volume Action  09/09/19 0950  30 mL Given    heparin sodium (porcine) injection (Units) Total dose:  12,000 Units Date/Time  Rate/Dose/Volume  Action  09/09/19 1039  12,000 Units Given    clopidogrel (PLAVIX) tablet (mg) Total dose:  600 mg Date/Time  Rate/Dose/Volume Action  09/09/19 1046  600 mg Given    famotidine (PEPCID) IVPB 20 mg premix (mg) Total dose:  Cannot be calculated* *Continuous medication not stopped within the calculation time range. Date/Time  Rate/Dose/Volume Action  09/09/19 1058  20 mg - 100 mL/hr New Bag/Given    iohexol (OMNIPAQUE) 350 MG/ML injection (mL) Total volume:  125 mL Date/Time  Rate/Dose/Volume Action  09/09/19 1109  125 mL Given    ezetimibe (ZETIA) tablet 10 mg (mg) Total dose:  Cannot be calculated* Dosing weight:  108.2 *Administration dose not documented Date/Time  Rate/Dose/Volume Action  09/09/19 1000  *Not included in total Automatically Held    finasteride (PROSCAR) tablet 5 mg (mg) Total dose:  Cannot be calculated* *Administration dose not documented Date/Time  Rate/Dose/Volume Action  09/09/19 1000  *Not included in total Automatically Held    liothyronine (CYTOMEL) tablet 10 mcg (mcg) Total dose:  Cannot be calculated* Dosing weight:  108.2 *Administration dose not documented Date/Time  Rate/Dose/Volume Action  09/09/19 1000  *Not included in total Automatically Held    loratadine (CLARITIN) tablet 10 mg (mg) Total dose:  Cannot be calculated* Dosing weight:  108.2 *Administration dose not documented Date/Time  Rate/Dose/Volume Action  09/09/19 1000  *Not included in total Automatically Held    Sedation Time  Sedation Time Physician-1: 1 hour 29 minutes 44 seconds  Contrast  Medication Name Total Dose  iohexol (OMNIPAQUE) 350 MG/ML injection 125 mL    Radiation/Fluoro  Fluoro time: 19.8 (min)  DAP: 093818 (mGycm2) Cumulative Air Kerma: 1952 (mGy)  Coronary Findings  Diagnostic Dominance: Right Left Main  Mid LM to Dist LM lesion is 70% stenosed.  Left Anterior Descending  Second Diagonal Branch  2nd Diag lesion is 75% stenosed.  Left Circumflex   First Obtuse Marginal Branch  1st Mrg lesion is 75% stenosed.  Second Obtuse Marginal Branch  2nd Mrg-1 lesion is 90% stenosed.  2nd Mrg-2 lesion is 95% stenosed.  Right Coronary Artery  Prox RCA to Mid RCA lesion is 90% stenosed.  Mid RCA to Dist RCA lesion is 100% stenosed.  LIMA Graft To Dist LAD  Sequential Graft To Ramus, 2nd Mrg, RPDA  Prox Graft to Mid Graft lesion between Ramus and 2nd Mrg is 75% stenosed.  Origin to Prox Graft lesion between 2nd Mrg and RPDA is 99% stenosed.  Intervention  Prox Graft to Mid Graft lesion between Ramus and 2nd Mrg (Sequential Graft To Ramus, 2nd Mrg, RPDA)  Stent  Lesion crossed with guidewire. Pre-stent angioplasty was not performed. A drug-eluting stent was successfully placed. Stent does not overlap previously placed stentPost-stent angioplasty was not performed.  Post-Intervention Lesion Assessment  The intervention was successful. Pre-interventional TIMI flow is 3. Post-intervention TIMI flow is 3. No complications occurred at this lesion.  There is a 0% residual stenosis post intervention.  Origin to Prox Graft lesion between 2nd Mrg and RPDA (Sequential Graft To Ramus, 2nd Mrg, RPDA)  Stent  Pre-stent angioplasty was performed. A drug-eluting stent was successfully placed using a STENT RESOLUTE ONYX 3.0X18. Stent strut is well apposed. Stent does not overlap previously placed stentPost-stent angioplasty was not performed.  Post-Intervention Lesion Assessment  The intervention was successful. Pre-interventional TIMI flow is 3. Post-intervention TIMI flow is 3. No complications occurred at this lesion.  There is a 0% residual stenosis post intervention.  Right Heart  Right Heart Pressures Hemodynamic findings consistent with mitral valve regurgitation. Right atrial pressure-mean 6 Right ventricular pressure-33/6 Pulmonary artery pressure-37/18, mean 27 Pulmonary wedge pressure-V wave 12, mean 12 Cardiac output was 5.8 L/min with an index  of 2.63 L/min/m by Fick, 4.7 L/min with an index of 2.16 L/min/m by thermodilution LVEDP-9  Coronary Diagrams  Diagnostic Dominance: Right  Intervention    TRANSESOPHOGEAL ECHO REPORT       Patient Name:  Raymond Christensen Date of Exam: 09/13/2019  Medical Rec #: [id number]   Height:    68.0 in  Accession #:  [id number]   Weight:    236.5 lb  Date of Birth: [birth year]   BSA:     2.195 m  Patient Age:  87 years    BP:      100/60 mmHg  Patient Gender: M       HR:      69 bpm.  Exam Location: Inpatient   Procedure: Transesophageal Echo, Cardiac Doppler, Color Doppler and 3D  Echo   Indications:   Atrial Fibrillation; Mitral Regurgitation    History:     Patient has prior history of Echocardiogram examinations,  most          recent 09/08/2019. CAD; Risk Factors:Diabetes and  Dyslipidemia.    Sonographer:   Mikki Santee RDCS (AE)  Referring Phys: [id number] Leanor Kail  Diagnosing Phys: [name] MD   PROCEDURE: After discussion of the risks and benefits of a TEE, an  informed consent was obtained from the patient. The transesophogeal probe  was passed without difficulty through the esophogus of the patient. Local  oropharyngeal anesthetic  was provided  with Cetacaine. Sedation performed by different physician. The patient was  monitored while under deep sedation. Anesthestetic sedation was provided  intravenously by Anesthesiology: 206.47m of Propofol. The patient  developed no complications during the  procedure. He had episodes of hypotension and airway obstruction, but this  was able to be managed by the anesthesia team.   IMPRESSIONS    1. Left ventricular ejection fraction, by estimation, is 40 to 45%. The  left ventricle has mildly decreased function. Left ventricular endocardial  border not optimally defined to evaluate regional wall motion.  2. Right ventricular  systolic function is normal. The right ventricular  size is normal.  3. A left atrial/left atrial appendage thrombus was detected.  4. Moderate to severe regurgitation; severe by ERO and MR radius,  moderate by Rvol. Unable to pulse PV as he was not tolerating procedure  well at the time and expedited images were obtained. There appears to be a  ruptured chord and partial prolapsed  leaflet of A2. Taken together, highly suggestive of severe eccentric MR..  The mitral valve is degenerative. Moderate to severe mitral valve  regurgitation.  5. The aortic valve is tricuspid. Aortic valve regurgitation is mild. No  aortic stenosis is present.  6. There is Moderate (Grade III) plaque involving the descending aorta.   Conclusion(s)/Recommendation(s): Likely severe MR given measurements and  eccentric jet. There is thrombus of the left atrial appendage, so  cardioversion not attempted.   FINDINGS  Left Ventricle: Left ventricular ejection fraction, by estimation, is 40  to 45%. The left ventricle has mildly decreased function. Left ventricular  endocardial border not optimally defined to evaluate regional wall motion.  The left ventricular internal  cavity size was normal in size. There is no left ventricular hypertrophy.   Right Ventricle: The right ventricular size is normal. No increase in  right ventricular wall thickness. Right ventricular systolic function is  normal.   Left Atrium: There is echodensity in the left atrial appendage, separate  from artifact from the coumadin ridge. This is best seen on biplane in  multiple views. Therefore cardioversion not performed. Left atrial size  was not assessed. A left atrial/left  atrial appendage thrombus was detected.   Right Atrium: Right atrial size was not assessed.   Pericardium: There is no evidence of pericardial effusion.   Mitral Valve: Moderate to severe regurgitation; severe by ERO and MR  radius, moderate by Rvol.  Unable to pulse PV as he was not tolerating  procedure well at the time and expedited images were obtained. There  appears to be a ruptured chord and partial  prolapsed leaflet of A2. Taken together, highly suggestive of severe  eccentric MR. The mitral valve is degenerative in appearance. Moderate to  severe mitral valve regurgitation. There is no evidence of mitral valve  vegetation.   Tricuspid Valve: The tricuspid valve is normal in structure. Tricuspid  valve regurgitation is mild . No evidence of tricuspid stenosis. There is  no evidence of tricuspid valve vegetation.   Aortic Valve: The aortic valve is tricuspid. Aortic valve regurgitation is  mild. No aortic stenosis is present. There is no evidence of aortic valve  vegetation.   Pulmonic Valve: The pulmonic valve was grossly normal. Pulmonic valve  regurgitation is mild. There is no evidence of pulmonic valve vegetation.   Aorta: The aortic root is normal in size and structure. There is moderate  (Grade III) plaque involving the descending aorta.   IAS/Shunts: No atrial  level shunt detected by color flow Doppler.     MR Peak grad:  91.4 mmHg  TRICUSPID VALVE  MR Mean grad:  60.5 mmHg  TR Peak grad:  39.4 mmHg  MR Vmax:     478.00 cm/s TR Vmax:    314.00 cm/s  MR Vmean:    371.5 cm/s  MR PISA:     6.28 cm  MR PISA Eff ROA: 40 mm  MR PISA Radius: 1.00 cm   [name] MD  Electronically signed by [name] MD  Signature Date/Time: 09/13/2019/8:54:48 PM         Impression:  Patient has severe multivessel coronary artery disease with ischemic cardiomyopathy, severe symptomatic mitral regurgitation, and recurrent persistent atrial fibrillation that has failed 1 attempt at DC cardioversion on oral amiodarone.  He currently describes stable symptoms of exertional fatigue and shortness of breath consistent with chronic combined systolic and diastolic congestive heart  failure, [place] Heart Association functional class II.  I have personally reviewed the patient's recent echocardiograms and diagnostic cardiac catheterization.  Echocardiograms demonstrate the presence of at least moderate left ventricular systolic dysfunction with ejection fraction estimated between 35 and 45% in the setting of severe mitral regurgitation.  There are regional wall motion abnormalities with relative hypokinesis of the posterior lateral wall likely related to the patient's recent acute myocardial infarction.  Functional anatomy of the patient's mitral regurgitation is consistent with mixed etiology with both ischemic (type IIIb) and prolapse (type II) with a small flail segment involving the middle scallop of the anterior leaflet and ruptured primary chordae tendinae.  I suspect the patient's mitral regurgitation was initially caused by his ischemic heart disease and later exacerbated by the development of ruptured chordae tendinae causing exaggerated overriding of the anterior leaflet.  Anatomical characteristics appear potentially suitable for valve repair either with conventional surgical techniques or transcatheter edge-to-edge repair.  Diagnostic cardiac catheterization is notable for the presence of severe left main and multivessel coronary artery disease but continued patency of left internal mammary artery to the left anterior descending coronary artery and patency of 3 remaining saphenous vein grafts after successful PCI and stenting of the culprit vein graft to the OM and PDA.  At the time of the patient's acute presentation his pulmonary artery pressures were only mildly elevated.  Options include continued medical therapy versus high risk mitral valve repair with or without concomitant Maze procedure versus transcatheter edge-to-edge repair with or without catheter-based ablation for treatment of atrial fibrillation.  I would be very reluctant to consider this patient a candidate  for conventional surgery because of his advanced age, severe left ventricular systolic dysfunction, and numerous comorbid medical problems.  I would favor transcatheter edge-to-edge repair for management of the patient's severe symptomatic mitral regurgitation.    Plan:  The patient and his wife were counseled at length regarding treatment alternatives for management of severe symptomatic mitral regurgitation.  Alternative approaches such as conventional surgical mitral valve repair or replacement with or without minimally-invasive techniques, percutaneous edge-to-edge Mitraclip repair, and continued medical therapy without intervention were compared and contrasted at length.  The risks associated with conventional surgery were discussed in detail, as were expectations for post-operative convalescence, and why I would be reluctant to consider this patient a candidate for conventional surgery.  Issues specific to Mitraclip repair were discussed including questions about long term freedom from persistent or recurrent mitral regurgitation, the potential for device migration or embolization, and other technical complications related to the procedure itself.  Current availability of other catheter-based treatments for mitral regurgitation was discussed.  Long-term prognosis with medical therapy was discussed. This discussion was placed in the context of the patient's own specific clinical presentation and past medical history.  All of their questions have been addressed.  The patient desires to proceed with transcatheter edge-to-edge repair using MitraClip and will follow up with Dr. Burt Knack in the near future.    I spent in excess of 90 minutes during the conduct of this office consultation and >50% of this time involved direct face-to-face encounter with the patient for counseling and/or coordination of their care.     Valentina Gu. Roxy Manns, MD 12/03/2019 10:07 AM

## 2019-12-06 ENCOUNTER — Encounter (HOSPITAL_COMMUNITY)
Admission: RE | Admit: 2019-12-06 | Discharge: 2019-12-06 | Disposition: A | Discharge status: Home / Self Care | Payer: Medicare (Managed Care) | Source: Ambulatory Visit | Attending: Interventional Cardiology | Admitting: Interventional Cardiology

## 2019-12-06 ENCOUNTER — Other Ambulatory Visit: Payer: Self-pay

## 2019-12-06 DIAGNOSIS — Z955 Presence of coronary angioplasty implant and graft: Secondary | ICD-10-CM | POA: Diagnosis not present

## 2019-12-06 DIAGNOSIS — I214 Non-ST elevation (NSTEMI) myocardial infarction: Secondary | ICD-10-CM

## 2019-12-06 DIAGNOSIS — I340 Nonrheumatic mitral (valve) insufficiency: Secondary | ICD-10-CM

## 2019-12-07 NOTE — Progress Notes (Signed)
Cardiac Individual Treatment Plan  Patient Details  Name: Raymond Christensen MRN: [id number] Date of Birth: 19-Sep-1940 Referring Provider:     [place] from 10/21/2019 in [facility]  Referring Provider Belva Crome, MD      Initial Encounter Date:    CARDIAC REHAB PHASE II ORIENTATION from 10/21/2019 in [facility]  Date 10/21/19      Visit Diagnosis: NSTEMI (non-ST elevated myocardial infarction) ([place]) 09/09/19  S/P DES SVG to OM2 and PDA 09/09/19  Status post coronary artery stent placement  Patient's Home Medications on Admission:  Current Outpatient Medications:  .  amiodarone (PACERONE) 200 MG tablet, Take 2 tablets by mouth daily until 12/09/19, then decrease to one tablet by mouth daily, Disp: 103 tablet, Rfl: 3 .  apixaban (ELIQUIS) 5 MG TABS tablet, Take 1 tablet (5 mg total) by mouth 2 (two) times daily., Disp: 180 tablet, Rfl: 3 .  atorvastatin (LIPITOR) 80 MG tablet, Take 1 tablet (80 mg total) by mouth daily., Disp: 90 tablet, Rfl: 1 .  carvedilol (COREG) 6.25 MG tablet, Take 1 tablet (6.25 mg total) by mouth 2 (two) times daily with a meal., Disp: 180 tablet, Rfl: 3 .  Cholecalciferol (VITAMIN D3) 250 MCG (10000 UT) capsule, Take 10,000 Units by mouth daily. , Disp: , Rfl:  .  clopidogrel (PLAVIX) 75 MG tablet, Take 1 tablet (75 mg total) by mouth daily with breakfast., Disp: 30 tablet, Rfl: 3 .  colchicine 0.6 MG tablet, Take 1 tablet (0.6 mg total) by mouth daily as needed., Disp: 90 tablet, Rfl: 1 .  Cyanocobalamin (VITAMIN B-12 PO), Take 1,000 mcg by mouth daily. , Disp: , Rfl:  .  ezetimibe (ZETIA) 10 MG tablet, Take 1 tablet (10 mg total) by mouth daily., Disp: 90 tablet, Rfl: 1 .  famotidine (PEPCID) 20 MG tablet, Take 1 tablet (20 mg total) by mouth daily., Disp: 30 tablet, Rfl: 3 .  finasteride (PROSCAR) 5 MG tablet, TAKE 1 TABLET DAILY, Disp: 90 tablet, Rfl: 1 .  furosemide  (LASIX) 40 MG tablet, Take 1 tablet (40 mg total) by mouth daily., Disp: 90 tablet, Rfl: 3 .  levocetirizine (XYZAL) 5 MG tablet, TAKE 1 TABLET EVERY EVENING, Disp: 90 tablet, Rfl: 1 .  levothyroxine (SYNTHROID) 75 MCG tablet, Take 1 tablet (75 mcg total) by mouth daily., Disp: 90 tablet, Rfl: 1 .  liothyronine (CYTOMEL) 5 MCG tablet, TAKE 2 TABLETS DAILY, Disp: 180 tablet, Rfl: 3 .  Multiple Vitamins-Minerals (MULTIVITAMIN ADULT EXTRA C PO), Take 1 tablet by mouth daily. , Disp: , Rfl:  .  nitroGLYCERIN (NITROSTAT) 0.4 MG SL tablet, Place 1 tablet (0.4 mg total) under the tongue every 5 (five) minutes as needed for chest pain., Disp: 30 tablet, Rfl: 12 .  omega-3 acid ethyl esters (LOVAZA) 1 g capsule, Take 2 capsules (2 g total) by mouth 2 (two) times daily., Disp: 360 capsule, Rfl: 2 .  potassium chloride (KLOR-CON 10) 10 MEQ tablet, Take 2 tablets (20 mEq total) by mouth daily as needed (with every 40mg  dose of Lasix)., Disp: 90 tablet, Rfl: 1 .  sacubitril-valsartan (ENTRESTO) 24-26 MG, Take 1 tablet by mouth 2 (two) times daily., Disp: 180 tablet, Rfl: 3 .  spironolactone (ALDACTONE) 25 MG tablet, Take 0.5 tablets (12.5 mg total) by mouth daily., Disp: 135 tablet, Rfl: 3 .  tamsulosin (FLOMAX) 0.4 MG CAPS capsule, TAKE 1 CAPSULE DAILY, Disp: 90 capsule, Rfl: 3  Past  Medical History: Past Medical History:  Diagnosis Date  . Arthritis   . CAD (coronary artery disease)   . Concussion Summer 2012  . Essential hypertension 02/26/2017  . Heart disease   . Hyperlipidemia   . Hypothyroidism 10/15/2017  . Major depressive disorder   . Obstructive sleep apnea on CPAP   . Poor flexibility of tendon 12/01/2018  . Subungual hematoma of digit of hand 12/01/2018  . Subungual hematoma of right foot 12/01/2018  . Thyroid disease   . Type 2 diabetes mellitus without complication, without long-term current use of insulin ([place]) 02/26/2017    Tobacco Use: Social History   Tobacco Use  Smoking Status  Former Smoker  . Types: Cigarettes  Smokeless Tobacco Never Used    Labs: Recent Review Flowsheet Data    Labs for ITP Cardiac and Pulmonary Rehab Latest Ref Rng & Units 06/22/2019 09/08/2019 09/09/2019 09/09/2019 09/09/2019   Cholestrol 0 - 200 mg/dL 155 135 - - -   LDLCALC 0 - 99 mg/dL - 76 - - -   LDLDIRECT mg/dL 76.0 - - - -   HDL >40 mg/dL 39.80 42 - - -   Trlycerides <150 mg/dL 311.0(H) 85 - - -   Hemoglobin A1c 4.6 - 6.5 % 6.6(H) - - - -   PHART 7.35 - 7.45 - - - - 7.386   PCO2ART 32 - 48 mmHg - - - - 49.2(H)   HCO3 20.0 - 28.0 mmol/L - - 30.1(H) 29.9(H) 29.5(H)   TCO2 22 - 32 mmol/L - - 32 31 31   O2SAT % - - 70.0 71.0 100.0      Capillary Blood Glucose: Lab Results  Component Value Date   GLUCAP 123 (H) 09/09/2019   GLUCAP 134 (H) 09/08/2019     Exercise Target Goals: Exercise Program Goal: Individual exercise prescription set using results from initial 6 min walk test and THRR while considering  patient's activity barriers and safety.   Exercise Prescription Goal: Starting with aerobic activity 30 plus minutes a day, 3 days per week for initial exercise prescription. Provide home exercise prescription and guidelines that participant acknowledges understanding prior to discharge.  Activity Barriers & Risk Stratification:  Activity Barriers & Cardiac Risk Stratification - 10/21/19 1320      Activity Barriers & Cardiac Risk Stratification   Activity Barriers Other (comment)    Comments --           6 Minute Walk:  6 Minute Walk    Row Name 10/21/19 1047         6 Minute Walk   Phase Initial     Distance 1380 feet     Walk Time 6 minutes     # of Rest Breaks 0     MPH 2.61     METS 2.33     RPE 11     Perceived Dyspnea  0     VO2 Peak 8.16     Symptoms No     Resting HR 66 bpm     Resting BP 114/78     Resting Oxygen Saturation  98 %     Exercise Oxygen Saturation  during 6 min walk 98 %     Max Ex. HR 136 bpm     Max Ex. BP 134/80     2 Minute  Post BP 128/78            Oxygen Initial Assessment:   Oxygen Re-Evaluation:   Oxygen Discharge (Final Oxygen  Re-Evaluation):   Initial Exercise Prescription:  Initial Exercise Prescription - 10/21/19 1300      Date of Initial Exercise RX and Referring Provider   Date 10/21/19    Referring Provider Belva Crome, MD    Expected Discharge Date 12/17/19      NuStep   Level 2    SPM 75    Minutes 30    METs 2      Prescription Details   Frequency (times per week) 3    Duration Progress to 30 minutes of continuous aerobic without signs/symptoms of physical distress      Intensity   THRR 40-80% of Max Heartrate 57-114    Ratings of Perceived Exertion 11-13    Perceived Dyspnea 0-4      Progression   Progression Continue progressive overload as per policy without signs/symptoms or physical distress.      Resistance Training   Training Prescription Yes    Weight 3 lbs    Reps 10-15           Perform Capillary Blood Glucose checks as needed.  Exercise Prescription Changes:  Exercise Prescription Changes    Row Name 10/25/19 1300 11/05/19 1034 11/15/19 1116 11/29/19 1316       Response to Exercise   Blood Pressure (Admit) 112/62 104/62 108/60 122/68    Blood Pressure (Exercise) 118/72 122/68 118/70 130/54    Blood Pressure (Exit) 100/60 108/62 104/62 120/70    Heart Rate (Admit) 91 bpm 91 bpm 86 bpm 86 bpm    Heart Rate (Exercise) 93 bpm 94 bpm 92 bpm 90 bpm    Heart Rate (Exit) 75 bpm 91 bpm 86 bpm 77 bpm    Rating of Perceived Exertion (Exercise) 10 11 11 10     Perceived Dyspnea (Exercise) 0 0 0 0    Symptoms None None None None    Comments Pt's first day of exercise None -- --    Duration Progress to 10 minutes continuous walking  at current work load and total walking time to 30-45 min Progress to 10 minutes continuous walking  at current work load and total walking time to 30-45 min Progress to 10 minutes continuous walking  at current work load and  total walking time to 30-45 min Progress to 10 minutes continuous walking  at current work load and total walking time to 30-45 min    Intensity THRR unchanged THRR unchanged THRR unchanged THRR unchanged      Progression   Progression Continue to progress workloads to maintain intensity without signs/symptoms of physical distress. Continue to progress workloads to maintain intensity without signs/symptoms of physical distress. Continue to progress workloads to maintain intensity without signs/symptoms of physical distress. Continue to progress workloads to maintain intensity without signs/symptoms of physical distress.    Average METs 2.5 2.6 2.5 2.3      Resistance Training   Training Prescription Yes No Yes Yes    Weight 3 lbs -- 3 lbs 3 lbs    Reps 10-15 -- 10-15 10-15    Time 10 Minutes -- 10 Minutes 10 Minutes      Interval Training   Interval Training No No No No      NuStep   Level 2 3 3 4     SPM 85 90 90 85    Minutes 30 30 30 30     METs 2.5 2.6 2.5 2.3      Home Exercise Plan   Plans to continue exercise at -- --  Home (comment)  Walking Home (comment)  Walking    Frequency -- -- Add 1 additional day to program exercise sessions. Add 1 additional day to program exercise sessions.    Initial Home Exercises Provided -- -- 11/15/19 11/15/19           Exercise Comments:  Exercise Comments    Row Name 10/25/19 1354 11/11/19 1035 11/15/19 1139 12/01/19 1122     Exercise Comments Pt's first day of cardiac rehab. Pt responded well to exercise. Will continue to increase workloads and monitor pt. Pt is responding well to prescription and workload increases. Will follow up with pt regarding home exercise plan. Reviewed home exercise guidelines, METs, and goals with patient. Reviewed METs and goals with patient.           Exercise Goals and Review:  Exercise Goals    Row Name 10/21/19 1321             Exercise Goals   Increase Physical Activity Yes       Intervention  Provide advice, education, support and counseling about physical activity/exercise needs.;Develop an individualized exercise prescription for aerobic and resistive training based on initial evaluation findings, risk stratification, comorbidities and participant's personal goals.       Expected Outcomes Short Term: Attend rehab on a regular basis to increase amount of physical activity.;Long Term: Add in home exercise to make exercise part of routine and to increase amount of physical activity.;Long Term: Exercising regularly at least 3-5 days a week.       Increase Strength and Stamina Yes       Intervention Provide advice, education, support and counseling about physical activity/exercise needs.;Develop an individualized exercise prescription for aerobic and resistive training based on initial evaluation findings, risk stratification, comorbidities and participant's personal goals.       Expected Outcomes Short Term: Increase workloads from initial exercise prescription for resistance, speed, and METs.;Short Term: Perform resistance training exercises routinely during rehab and add in resistance training at home;Long Term: Improve cardiorespiratory fitness, muscular endurance and strength as measured by increased METs and functional capacity (6MWT)       Able to understand and use rate of perceived exertion (RPE) scale Yes       Intervention Provide education and explanation on how to use RPE scale       Expected Outcomes Short Term: Able to use RPE daily in rehab to express subjective intensity level;Long Term:  Able to use RPE to guide intensity level when exercising independently       Knowledge and understanding of Target Heart Rate Range (THRR) Yes       Intervention Provide education and explanation of THRR including how the numbers were predicted and where they are located for reference       Expected Outcomes Short Term: Able to state/look up THRR;Short Term: Able to use daily as guideline for  intensity in rehab;Long Term: Able to use THRR to govern intensity when exercising independently       Able to check pulse independently Yes       Intervention Provide education and demonstration on how to check pulse in carotid and radial arteries.;Review the importance of being able to check your own pulse for safety during independent exercise       Expected Outcomes Short Term: Able to explain why pulse checking is important during independent exercise;Long Term: Able to check pulse independently and accurately       Understanding of Exercise Prescription Yes  Intervention Provide education, explanation, and written materials on patient's individual exercise prescription       Expected Outcomes Short Term: Able to explain program exercise prescription;Long Term: Able to explain home exercise prescription to exercise independently              Exercise Goals Re-Evaluation :  Exercise Goals Re-Evaluation    Row Name 10/25/19 1356 11/15/19 1139 12/01/19 1122         Exercise Goal Re-Evaluation   Exercise Goals Review Increase Physical Activity;Knowledge and understanding of Target Heart Rate Range (THRR);Understanding of Exercise Prescription Increase Physical Activity;Knowledge and understanding of Target Heart Rate Range (THRR);Understanding of Exercise Prescription;Increase Strength and Stamina;Able to check pulse independently;Able to understand and use rate of perceived exertion (RPE) scale Increase Physical Activity;Knowledge and understanding of Target Heart Rate Range (THRR);Understanding of Exercise Prescription;Increase Strength and Stamina;Able to check pulse independently;Able to understand and use rate of perceived exertion (RPE) scale     Comments Pt's first day of cardaic rehab. Pt able to exercise 30 minutes with minimal difficulty. Will continue to monitor. Reviewed home exercise guidelines with patient including endpoints, temperature precautions, target heart rate and  rate of perceived exertion. Patient is stretching some at home but is not walking. Encouraged patient to add 1-2 days/week walking 30 minutes either 15 minutes twice/day or 30 minutes once/day as his mode of home exercise, and patient is agreeable to this. Patient states that he is able to manually count is pulse. Patient states that he's walking about 30 minutes, 3 days/week as his mode of home exercise. Patient still gets tired, but is feeling better since starting the program.     Expected Outcomes Pt will continue to increase strength and stamina. Patient will add 30 minutes of walking 1-2 days/week at home in addition to exercise at cardiac rehab to help achieve personal health and fitness goals. Patient will continue walking at home in addition to exercise at cardiac rehab to help build stamina and endurance.             Discharge Exercise Prescription (Final Exercise Prescription Changes):  Exercise Prescription Changes - 11/29/19 1316      Response to Exercise   Blood Pressure (Admit) 122/68    Blood Pressure (Exercise) 130/54    Blood Pressure (Exit) 120/70    Heart Rate (Admit) 86 bpm    Heart Rate (Exercise) 90 bpm    Heart Rate (Exit) 77 bpm    Rating of Perceived Exertion (Exercise) 10    Perceived Dyspnea (Exercise) 0    Symptoms None    Duration Progress to 10 minutes continuous walking  at current work load and total walking time to 30-45 min    Intensity THRR unchanged      Progression   Progression Continue to progress workloads to maintain intensity without signs/symptoms of physical distress.    Average METs 2.3      Resistance Training   Training Prescription Yes    Weight 3 lbs    Reps 10-15    Time 10 Minutes      Interval Training   Interval Training No      NuStep   Level 4    SPM 85    Minutes 30    METs 2.3      Home Exercise Plan   Plans to continue exercise at Home (comment)   Walking   Frequency Add 1 additional day to program exercise  sessions.    Initial Home Exercises  Provided 11/15/19           Nutrition:  Target Goals: Understanding of nutrition guidelines, daily intake of sodium 1500mg , cholesterol 200mg , calories 30% from fat and 7% or less from saturated fats, daily to have 5 or more servings of fruits and vegetables.  Biometrics:  Pre Biometrics - 10/21/19 0930      Pre Biometrics   Waist Circumference 48 inches    Hip Circumference 49.5 inches    Waist to Hip Ratio 0.97 %    Triceps Skinfold 15 mm    % Body Fat 36.1 %    Grip Strength 35 kg    Flexibility 0 in   Pt could not reach box   Single Leg Stand 2.37 seconds   High risk for fall           Nutrition Therapy Plan and Nutrition Goals:  Nutrition Therapy & Goals - 11/01/19 1324      Nutrition Therapy   Diet Heart healthy/carb modified    Drug/Food Interactions Statins/Certain Fruits      Personal Nutrition Goals   Nutrition Goal Pt to reduce night time snacking    Personal Goal #2 Pt to identify food quantities necessary to achieve weight loss of 6-24 lb at graduation from cardiac rehab.    Personal Goal #3 Pt to build a healthy plate including vegetables, fruits, whole grains, and low-fat dairy products in a heart healthy meal plan.      Intervention Plan   Intervention Prescribe, educate and counsel regarding individualized specific dietary modifications aiming towards targeted core components such as weight, hypertension, lipid management, diabetes, heart failure and other comorbidities.    Expected Outcomes Short Term Goal: Understand basic principles of dietary content, such as calories, fat, sodium, cholesterol and nutrients.           Nutrition Assessments:  Nutrition Assessments - 11/01/19 1325      MEDFICTS Scores   Pre Score 61          MEDIFICTS Score Key:  ?70 Need to make dietary changes   40-70 Heart Healthy Diet  ? 40 Therapeutic Level Cholesterol Diet   Picture Your Plate Scores:  <90 Unhealthy  dietary pattern with much room for improvement.  41-50 Dietary pattern unlikely to meet recommendations for good health and room for improvement.  51-60 More healthful dietary pattern, with some room for improvement.   >60 Healthy dietary pattern, although there may be some specific behaviors that could be improved.    Nutrition Goals Re-Evaluation:  Nutrition Goals Re-Evaluation    [place] Name 11/01/19 1325 12/03/19 1142           Goals   Current Weight 242 lb (109.8 kg) 241 lb 2.9 oz (109.4 kg)      Nutrition Goal Pt to reduce night time snacking Pt to reduce night time snacking      Comment -- Pt reports decreasing night time snacking.        Personal Goal #2 Re-Evaluation   Personal Goal #2 Pt to identify food quantities necessary to achieve weight loss of 6-24 lb at graduation from cardiac rehab. Pt to identify food quantities necessary to achieve weight loss of 6-24 lb at graduation from cardiac rehab.        Personal Goal #3 Re-Evaluation   Personal Goal #3 Pt to build a healthy plate including vegetables, fruits, whole grains, and low-fat dairy products in a heart healthy meal plan. Pt to build a healthy plate including vegetables,  fruits, whole grains, and low-fat dairy products in a heart healthy meal plan.             Nutrition Goals Discharge (Final Nutrition Goals Re-Evaluation):  Nutrition Goals Re-Evaluation - 12/03/19 1142      Goals   Current Weight 241 lb 2.9 oz (109.4 kg)    Nutrition Goal Pt to reduce night time snacking    Comment Pt reports decreasing night time snacking.      Personal Goal #2 Re-Evaluation   Personal Goal #2 Pt to identify food quantities necessary to achieve weight loss of 6-24 lb at graduation from cardiac rehab.      Personal Goal #3 Re-Evaluation   Personal Goal #3 Pt to build a healthy plate including vegetables, fruits, whole grains, and low-fat dairy products in a heart healthy meal plan.           Psychosocial: Target  Goals: Acknowledge presence or absence of significant depression and/or stress, maximize coping skills, provide positive support system. Participant is able to verbalize types and ability to use techniques and skills needed for reducing stress and depression.  Initial Review & Psychosocial Screening:  Initial Psych Review & Screening - 10/21/19 1430      Initial Review   Current issues with Current Depression      Family Dynamics   Good Support System? Yes   Dequante has his wife for support   Comments Benjamine says that he gets depressed at times.  Patient is not currently on an antidepressant.      Barriers   Psychosocial barriers to participate in program The patient should benefit from training in stress management and relaxation.      Screening Interventions   Interventions Encouraged to exercise;Provide feedback about the scores to participant;To provide support and resources with identified psychosocial needs    Expected Outcomes Short Term goal: Utilizing psychosocial counselor, staff and physician to assist with identification of specific Stressors or current issues interfering with healing process. Setting desired goal for each stressor or current issue identified.;Long Term Goal: Stressors or current issues are controlled or eliminated.;Short Term goal: Identification and review with participant of any Quality of Life or Depression concerns found by scoring the questionnaire.;Long Term goal: The participant improves quality of Life and PHQ9 Scores as seen by post scores and/or verbalization of changes           Quality of Life Scores:  Quality of Life - 10/21/19 1310      Quality of Life   Select Quality of Life      Quality of Life Scores   Health/Function Pre 21.53 %    Socioeconomic Pre 25.36 %    Psych/Spiritual Pre 20.86 %    Family Pre 26.4 %    GLOBAL Pre 22.9 %          Scores of 19 and below usually indicate a poorer quality of life in these areas.  A  difference of  2-3 points is a clinically meaningful difference.  A difference of 2-3 points in the total score of the Quality of Life Index has been associated with significant improvement in overall quality of life, self-image, physical symptoms, and general health in studies assessing change in quality of life.  PHQ-9: Recent Review Flowsheet Data    Depression screen [facility] 2/9 10/21/2019 06/02/2019 02/04/2019 01/12/2018 03/18/2017   Decreased Interest 0 1 0 0 0   Down, Depressed, Hopeless 1 1 0 1 0   PHQ - 2 Score  1 2 0 1 0   Altered sleeping - 0 - - -   Tired, decreased energy - 1 - - -   Change in appetite - 0 - - -   Feeling bad or failure about yourself  - 0 - - -   Trouble concentrating - 0 - - -   Moving slowly or fidgety/restless - 0 - - -   Suicidal thoughts - 0 - - -   PHQ-9 Score - 3 - - -   Difficult doing work/chores - Somewhat difficult - - -     Interpretation of Total Score  Total Score Depression Severity:  1-4 = Minimal depression, 5-9 = Mild depression, 10-14 = Moderate depression, 15-19 = Moderately severe depression, 20-27 = Severe depression   Psychosocial Evaluation and Intervention:   Psychosocial Re-Evaluation:  Psychosocial Re-Evaluation    Row Name 11/10/19 1352 12/07/19 1123           Psychosocial Re-Evaluation   Current issues with Current Depression Current Depression      Comments Patient has not been at cardiac rehab to exercise this week. Jaycion has some health stressors and has not voiced any increased stressors or concerns      Expected Outcomes Patient will have decreased depression upon completion of phase 2 cardiac rehab. Patient will have decreased depression upon completion of phase 2 cardiac rehab.      Interventions -- Stress management education;Encouraged to attend Cardiac Rehabilitation for the exercise      Continue Psychosocial Services  Follow up required by staff Follow up required by staff             Psychosocial Discharge  (Final Psychosocial Re-Evaluation):  Psychosocial Re-Evaluation - 12/07/19 1123      Psychosocial Re-Evaluation   Current issues with Current Depression    Comments Shawna has some health stressors and has not voiced any increased stressors or concerns    Expected Outcomes Patient will have decreased depression upon completion of phase 2 cardiac rehab.    Interventions Stress management education;Encouraged to attend Cardiac Rehabilitation for the exercise    Continue Psychosocial Services  Follow up required by staff           Vocational Rehabilitation: Provide vocational rehab assistance to qualifying candidates.   Vocational Rehab Evaluation & Intervention:  Vocational Rehab - 10/21/19 1435      Initial Vocational Rehab Evaluation & Intervention   Assessment shows need for Vocational Rehabilitation No           Education: Education Goals: Education classes will be provided on a weekly basis, covering required topics. Participant will state understanding/return demonstration of topics presented.  Learning Barriers/Preferences:  Learning Barriers/Preferences - 10/21/19 1311      Learning Barriers/Preferences   Learning Barriers Hearing   Bilateral hearing aids   Learning Preferences None           Education Topics: Hypertension, Hypertension Reduction -Define heart disease and high blood pressure. Discus how high blood pressure affects the body and ways to reduce high blood pressure.   Exercise and Your Heart -Discuss why it is important to exercise, the FITT principles of exercise, normal and abnormal responses to exercise, and how to exercise safely.   Angina -Discuss definition of angina, causes of angina, treatment of angina, and how to decrease risk of having angina.   Cardiac Medications -Review what the following cardiac medications are used for, how they affect the body, and side effects that may occur when  taking the medications.  Medications include  Aspirin, Beta blockers, calcium channel blockers, ACE Inhibitors, angiotensin receptor blockers, diuretics, digoxin, and antihyperlipidemics.   Congestive Heart Failure -Discuss the definition of CHF, how to live with CHF, the signs and symptoms of CHF, and how keep track of weight and sodium intake.   Heart Disease and Intimacy -Discus the effect sexual activity has on the heart, how changes occur during intimacy as we age, and safety during sexual activity.   Smoking Cessation / COPD -Discuss different methods to quit smoking, the health benefits of quitting smoking, and the definition of COPD.   Nutrition I: Fats -Discuss the types of cholesterol, what cholesterol does to the heart, and how cholesterol levels can be controlled.   Nutrition II: Labels -Discuss the different components of food labels and how to read food label   Heart Parts/Heart Disease and PAD -Discuss the anatomy of the heart, the pathway of blood circulation through the heart, and these are affected by heart disease.   Stress I: Signs and Symptoms -Discuss the causes of stress, how stress may lead to anxiety and depression, and ways to limit stress.   Stress II: Relaxation -Discuss different types of relaxation techniques to limit stress.   Warning Signs of Stroke / TIA -Discuss definition of a stroke, what the signs and symptoms are of a stroke, and how to identify when someone is having stroke.   Knowledge Questionnaire Score:  Knowledge Questionnaire Score - 10/21/19 1309      Knowledge Questionnaire Score   Pre Score 20/24           Core Components/Risk Factors/Patient Goals at Admission:  Personal Goals and Risk Factors at Admission - 10/21/19 1312      Core Components/Risk Factors/Patient Goals on Admission    Weight Management Yes;Obesity;Weight Loss    Intervention Weight Management: Develop a combined nutrition and exercise program designed to reach desired caloric intake, while  maintaining appropriate intake of nutrient and fiber, sodium and fats, and appropriate energy expenditure required for the weight goal.;Weight Management: Provide education and appropriate resources to help participant work on and attain dietary goals.;Weight Management/Obesity: Establish reasonable short term and long term weight goals.;Obesity: Provide education and appropriate resources to help participant work on and attain dietary goals.    Admit Weight 245 lb 13 oz (111.5 kg)    Expected Outcomes Short Term: Continue to assess and modify interventions until short term weight is achieved;Long Term: Adherence to nutrition and physical activity/exercise program aimed toward attainment of established weight goal;Weight Maintenance: Understanding of the daily nutrition guidelines, which includes 25-35% calories from fat, 7% or less cal from saturated fats, less than 200mg  cholesterol, less than 1.5gm of sodium, & 5 or more servings of fruits and vegetables daily;Weight Loss: Understanding of general recommendations for a balanced deficit meal plan, which promotes 1-2 lb weight loss per week and includes a negative energy balance of [phone] kcal/d;Understanding recommendations for meals to include 15-35% energy as protein, 25-35% energy from fat, 35-60% energy from carbohydrates, less than 200mg  of dietary cholesterol, 20-35 gm of total fiber daily;Understanding of distribution of calorie intake throughout the day with the consumption of 4-5 meals/snacks    Diabetes Yes    Intervention Provide education about signs/symptoms and action to take for hypo/hyperglycemia.;Provide education about proper nutrition, including hydration, and aerobic/resistive exercise prescription along with prescribed medications to achieve blood glucose in normal ranges: Fasting glucose 65-99 mg/dL    Expected Outcomes Short Term: Participant verbalizes understanding  of the signs/symptoms and immediate care of hyper/hypoglycemia,  proper foot care and importance of medication, aerobic/resistive exercise and nutrition plan for blood glucose control.;Long Term: Attainment of HbA1C < 7%.    Hypertension Yes    Intervention Monitor prescription use compliance.;Provide education on lifestyle modifcations including regular physical activity/exercise, weight management, moderate sodium restriction and increased consumption of fresh fruit, vegetables, and low fat dairy, alcohol moderation, and smoking cessation.    Expected Outcomes Short Term: Continued assessment and intervention until BP is < 140/16mm HG in hypertensive participants. < 130/3mm HG in hypertensive participants with diabetes, heart failure or chronic kidney disease.;Long Term: Maintenance of blood pressure at goal levels.    Lipids Yes    Intervention Provide education and support for participant on nutrition & aerobic/resistive exercise along with prescribed medications to achieve LDL 70mg , HDL >40mg .    Expected Outcomes Short Term: Participant states understanding of desired cholesterol values and is compliant with medications prescribed. Participant is following exercise prescription and nutrition guidelines.;Long Term: Cholesterol controlled with medications as prescribed, with individualized exercise RX and with personalized nutrition plan. Value goals: LDL < 70mg , HDL > 40 mg.           Core Components/Risk Factors/Patient Goals Review:   Goals and Risk Factor Review    Row Name 11/11/19 1150 11/11/19 1151 12/07/19 1124         Core Components/Risk Factors/Patient Goals Review   Personal Goals Review Weight Management/Obesity Weight Management/Obesity;Lipids;Diabetes;Hypertension Weight Management/Obesity;Lipids;Diabetes;Hypertension     Review -- Dave has been fair with exercise. Whitten's vital signs have been stable. Shadrack's diabetes is diet controlled. Zamauri has been doing okay with exercise. Cyprian's vital signs have been stable. Torien says  he is having Mitral valve Surgery in December after he completes cardiac rehab     Expected Outcomes -- Sevastian will continue to participate in phase 2 cardiac rehab for exercise, diet and lifestyle modifications Semir will continue to participate in phase 2 cardiac rehab for exercise, diet and lifestyle modifications            Core Components/Risk Factors/Patient Goals at Discharge (Final Review):   Goals and Risk Factor Review - 12/07/19 1124      Core Components/Risk Factors/Patient Goals Review   Personal Goals Review Weight Management/Obesity;Lipids;Diabetes;Hypertension    Review Adelbert has been doing okay with exercise. Zaim's vital signs have been stable. Truth says he is having Mitral valve Surgery in December after he completes cardiac rehab    Expected Outcomes Brysin will continue to participate in phase 2 cardiac rehab for exercise, diet and lifestyle modifications           ITP Comments:  ITP Comments    Row Name 10/21/19 1359 10/21/19 1424 11/10/19 1349 12/07/19 1122     ITP Comments Dr Fransico Him MD, Medical Director Dr Fransico Him MD, Medical Director 30 Day ITP Review. Tykel is off to a fair start to exercise 30 Day ITP Review. Tawfiq has good attendance and participation in phase 2 cardiac rehab.           Comments: See ITP Review. Arish has loss 2.8 kg since starting cardiac rehab.Barnet Pall, RN,BSN 12/07/2019 11:29 AM

## 2019-12-08 ENCOUNTER — Encounter (HOSPITAL_COMMUNITY)
Admission: RE | Admit: 2019-12-08 | Discharge: 2019-12-08 | Disposition: A | Discharge status: Home / Self Care | Payer: Medicare (Managed Care) | Source: Ambulatory Visit | Attending: Interventional Cardiology | Admitting: Interventional Cardiology

## 2019-12-08 ENCOUNTER — Other Ambulatory Visit: Payer: Self-pay

## 2019-12-08 VITALS — Ht 66.75 in | Wt 230.6 lb

## 2019-12-08 DIAGNOSIS — I214 Non-ST elevation (NSTEMI) myocardial infarction: Secondary | ICD-10-CM

## 2019-12-08 DIAGNOSIS — Z955 Presence of coronary angioplasty implant and graft: Secondary | ICD-10-CM

## 2019-12-10 ENCOUNTER — Encounter (HOSPITAL_COMMUNITY): Payer: Medicare (Managed Care)

## 2019-12-13 ENCOUNTER — Other Ambulatory Visit: Payer: Self-pay

## 2019-12-13 ENCOUNTER — Encounter (HOSPITAL_COMMUNITY)
Admission: RE | Admit: 2019-12-13 | Discharge: 2019-12-13 | Disposition: A | Discharge status: Home / Self Care | Payer: Medicare (Managed Care) | Source: Ambulatory Visit | Attending: Interventional Cardiology | Admitting: Interventional Cardiology

## 2019-12-13 DIAGNOSIS — I214 Non-ST elevation (NSTEMI) myocardial infarction: Secondary | ICD-10-CM

## 2019-12-13 DIAGNOSIS — Z955 Presence of coronary angioplasty implant and graft: Secondary | ICD-10-CM | POA: Diagnosis not present

## 2019-12-15 ENCOUNTER — Encounter (HOSPITAL_COMMUNITY)
Admission: RE | Admit: 2019-12-15 | Discharge: 2019-12-15 | Disposition: A | Discharge status: Home / Self Care | Payer: Medicare (Managed Care) | Source: Ambulatory Visit | Attending: Interventional Cardiology | Admitting: Interventional Cardiology

## 2019-12-15 ENCOUNTER — Other Ambulatory Visit: Payer: Self-pay

## 2019-12-15 DIAGNOSIS — Z955 Presence of coronary angioplasty implant and graft: Secondary | ICD-10-CM | POA: Insufficient documentation

## 2019-12-15 DIAGNOSIS — I214 Non-ST elevation (NSTEMI) myocardial infarction: Secondary | ICD-10-CM | POA: Diagnosis not present

## 2019-12-15 NOTE — Progress Notes (Signed)
QUALITY OF LIFE SCORE REVIEW  [name] completed his postQuality of Life survey as a participant in Cardiac Rehab. Scores 21.0 or below are considered low. Pt score very low in several areas Overall 22.53, Health and Function 19.97, socioeconomic 27.50, physiological and spiritual 21.21, family 26.1. Patient quality of life slightly altered by physical constraints which limits ability to perform as prior to recent cardiac illness. Raymond Christensen admits to feeling dissatisfied with his health due to his upcoming mitral valve surgery and a little depressed.[name] emotional support and reassurance.  Will continue to monitor and intervene as necessary. [name] says participating in phase 2 cardiac rehab has been somewhat helpful for him.[name] has given permission to forward his quality of life questionnaire to Dr Nani Ravens his primary care physician.Barnet Pall, RN,BSN 12/15/2019 2:09 PM

## 2019-12-16 ENCOUNTER — Other Ambulatory Visit: Payer: Self-pay | Admitting: Family Medicine

## 2019-12-16 DIAGNOSIS — E781 Pure hyperglyceridemia: Secondary | ICD-10-CM

## 2019-12-17 ENCOUNTER — Other Ambulatory Visit: Payer: Self-pay

## 2019-12-17 ENCOUNTER — Encounter (HOSPITAL_COMMUNITY)
Admission: RE | Admit: 2019-12-17 | Discharge: 2019-12-17 | Disposition: A | Discharge status: Home / Self Care | Payer: Medicare (Managed Care) | Source: Ambulatory Visit | Attending: Interventional Cardiology | Admitting: Interventional Cardiology

## 2019-12-17 VITALS — BP 94/50 | HR 91 | Ht 66.75 in | Wt 238.3 lb

## 2019-12-17 DIAGNOSIS — Z955 Presence of coronary angioplasty implant and graft: Secondary | ICD-10-CM

## 2019-12-17 DIAGNOSIS — I214 Non-ST elevation (NSTEMI) myocardial infarction: Secondary | ICD-10-CM | POA: Diagnosis not present

## 2019-12-17 NOTE — Progress Notes (Signed)
Discharge Progress Report  Patient Details  Name: Raymond Christensen MRN: [id number] Date of Birth: December 01, 1940 Referring Provider:     [place] from 10/21/2019 in [place]  Referring Provider Belva Crome, MD       Number of Visits: 19  Reason for Discharge:  Patient reached a stable level of exercise.  Smoking History:  Social History   Tobacco Use  Smoking Status Former Smoker  . Types: Cigarettes  Smokeless Tobacco Never Used    Diagnosis:  NSTEMI (non-ST elevated myocardial infarction) ([place]) 09/09/19  S/P DES SVG to OM2 and PDA 09/09/19  Status post coronary artery stent placement  ADL [facility]:   Initial Exercise Prescription:  Initial Exercise Prescription - 10/21/19 1300      Date of Initial Exercise RX and Referring Provider   Date 10/21/19    Referring Provider Belva Crome, MD    Expected Discharge Date 12/17/19      NuStep   Level 2    SPM 75    Minutes 30    METs 2      Prescription Details   Frequency (times per week) 3    Duration Progress to 30 minutes of continuous aerobic without signs/symptoms of physical distress      Intensity   THRR 40-80% of Max Heartrate 57-114    Ratings of Perceived Exertion 11-13    Perceived Dyspnea 0-4      Progression   Progression Continue progressive overload as per policy without signs/symptoms or physical distress.      Resistance Training   Training Prescription Yes    Weight 3 lbs    Reps 10-15           Discharge Exercise Prescription (Final Exercise Prescription Changes):  Exercise Prescription Changes - 12/13/19 1126      Response to Exercise   Blood Pressure (Admit) 108/60    Blood Pressure (Exercise) 126/80    Blood Pressure (Exit) 104/58    Heart Rate (Admit) 95 bpm    Heart Rate (Exercise) 100 bpm    Heart Rate (Exit) 88 bpm    Rating of Perceived Exertion (Exercise) 10    Perceived Dyspnea (Exercise) 0    Symptoms None     Duration Progress to 10 minutes continuous walking  at current work load and total walking time to 30-45 min    Intensity THRR unchanged      Progression   Progression Continue to progress workloads to maintain intensity without signs/symptoms of physical distress.    Average METs 2.7      Resistance Training   Training Prescription Yes    Weight 3 lbs    Reps 10-15    Time 10 Minutes      Interval Training   Interval Training No      NuStep   Level 5    SPM 85    Minutes 30    METs 2.7      Home Exercise Plan   Plans to continue exercise at Home (comment)   Walking   Frequency Add 1 additional day to program exercise sessions.    Initial Home Exercises Provided 11/15/19           Functional Capacity:  6 Minute Walk    Row Name 10/21/19 1047 12/08/19 1328       6 Minute Walk   Phase Initial Discharge    Distance 1380 feet 1490 feet    Distance %  Change -- 8 %    Distance Feet Change -- 110 ft    Walk Time 6 minutes 6 minutes    # of Rest Breaks 0 0    MPH 2.61 2.82    METS 2.33 3.15    RPE 11 11    Perceived Dyspnea  0 0    VO2 Peak 8.16 11.03    Symptoms No No    Resting HR 66 bpm 71 bpm    Resting BP 114/78 124/54    Resting Oxygen Saturation  98 % 98 %    Exercise Oxygen Saturation  during 6 min walk 98 % 97 %    Max Ex. HR 136 bpm 118 bpm    Max Ex. BP 134/80 150/74    2 Minute Post BP 128/78 --           Psychological, QOL, Others - Outcomes: PHQ 2/9: Depression screen [facility] 2/9 10/21/2019 06/02/2019 02/04/2019 01/12/2018 03/18/2017  Decreased Interest 0 1 0 0 0  Down, Depressed, Hopeless 1 1 0 1 0  PHQ - 2 Score 1 2 0 1 0  Altered sleeping - 0 - - -  Tired, decreased energy - 1 - - -  Change in appetite - 0 - - -  Feeling bad or failure about yourself  - 0 - - -  Trouble concentrating - 0 - - -  Moving slowly or fidgety/restless - 0 - - -  Suicidal thoughts - 0 - - -  PHQ-9 Score - 3 - - -  Difficult doing work/chores - Somewhat difficult - -  -    Quality of Life:  Quality of Life - 12/14/19 1311      Quality of Life   Select Quality of Life      Quality of Life Scores   Health/Function Pre 21.53 %    Health/Function Post 19.97 %    Health/Function % Change -7.25 %    Socioeconomic Pre 25.36 %    Socioeconomic Post 27.5 %    Socioeconomic % Change  8.44 %    Psych/Spiritual Pre 20.86 %    Psych/Spiritual Post 21.21 %    Psych/Spiritual % Change 1.68 %    Family Pre 26.4 %    Family Post 26.1 %    Family % Change -1.14 %    GLOBAL Pre 22.9 %    GLOBAL Post 22.53 %    GLOBAL % Change -1.62 %           Personal Goals: Goals established at orientation with interventions provided to work toward goal.  Personal Goals and Risk Factors at Admission - 10/21/19 1312      Core Components/Risk Factors/Patient Goals on Admission    Weight Management Yes;Obesity;Weight Loss    Intervention Weight Management: Develop a combined nutrition and exercise program designed to reach desired caloric intake, while maintaining appropriate intake of nutrient and fiber, sodium and fats, and appropriate energy expenditure required for the weight goal.;Weight Management: Provide education and appropriate resources to help participant work on and attain dietary goals.;Weight Management/Obesity: Establish reasonable short term and long term weight goals.;Obesity: Provide education and appropriate resources to help participant work on and attain dietary goals.    Admit Weight 245 lb 13 oz (111.5 kg)    Expected Outcomes Short Term: Continue to assess and modify interventions until short term weight is achieved;Long Term: Adherence to nutrition and physical activity/exercise program aimed toward attainment of established weight goal;Weight Maintenance: Understanding of  the daily nutrition guidelines, which includes 25-35% calories from fat, 7% or less cal from saturated fats, less than 200mg  cholesterol, less than 1.5gm of sodium, & 5 or more  servings of fruits and vegetables daily;Weight Loss: Understanding of general recommendations for a balanced deficit meal plan, which promotes 1-2 lb weight loss per week and includes a negative energy balance of [phone] kcal/d;Understanding recommendations for meals to include 15-35% energy as protein, 25-35% energy from fat, 35-60% energy from carbohydrates, less than 200mg  of dietary cholesterol, 20-35 gm of total fiber daily;Understanding of distribution of calorie intake throughout the day with the consumption of 4-5 meals/snacks    Diabetes Yes    Intervention Provide education about signs/symptoms and action to take for hypo/hyperglycemia.;Provide education about proper nutrition, including hydration, and aerobic/resistive exercise prescription along with prescribed medications to achieve blood glucose in normal ranges: Fasting glucose 65-99 mg/dL    Expected Outcomes Short Term: Participant verbalizes understanding of the signs/symptoms and immediate care of hyper/hypoglycemia, proper foot care and importance of medication, aerobic/resistive exercise and nutrition plan for blood glucose control.;Long Term: Attainment of HbA1C < 7%.    Hypertension Yes    Intervention Monitor prescription use compliance.;Provide education on lifestyle modifcations including regular physical activity/exercise, weight management, moderate sodium restriction and increased consumption of fresh fruit, vegetables, and low fat dairy, alcohol moderation, and smoking cessation.    Expected Outcomes Short Term: Continued assessment and intervention until BP is < 140/18mm HG in hypertensive participants. < 130/83mm HG in hypertensive participants with diabetes, heart failure or chronic kidney disease.;Long Term: Maintenance of blood pressure at goal levels.    Lipids Yes    Intervention Provide education and support for participant on nutrition & aerobic/resistive exercise along with prescribed medications to achieve LDL 70mg ,  HDL >40mg .    Expected Outcomes Short Term: Participant states understanding of desired cholesterol values and is compliant with medications prescribed. Participant is following exercise prescription and nutrition guidelines.;Long Term: Cholesterol controlled with medications as prescribed, with individualized exercise RX and with personalized nutrition plan. Value goals: LDL < 70mg , HDL > 40 mg.            Personal Goals Discharge:  Goals and Risk Factor Review    Row Name 11/11/19 1150 11/11/19 1151 12/07/19 1124         Core Components/Risk Factors/Patient Goals Review   Personal Goals Review Weight Management/Obesity Weight Management/Obesity;Lipids;Diabetes;Hypertension Weight Management/Obesity;Lipids;Diabetes;Hypertension     Review -- Mack has been fair with exercise. Raydan's vital signs have been stable. Parry's diabetes is diet controlled. Aksh has been doing okay with exercise. Levy's vital signs have been stable. Joselito says he is having Mitral valve Surgery in December after he completes cardiac rehab     Expected Outcomes -- Keylin will continue to participate in phase 2 cardiac rehab for exercise, diet and lifestyle modifications Jaxsin will continue to participate in phase 2 cardiac rehab for exercise, diet and lifestyle modifications            Exercise Goals and Review:  Exercise Goals    Row Name 10/21/19 1321             Exercise Goals   Increase Physical Activity Yes       Intervention Provide advice, education, support and counseling about physical activity/exercise needs.;Develop an individualized exercise prescription for aerobic and resistive training based on initial evaluation findings, risk stratification, comorbidities and participant's personal goals.       Expected Outcomes Short Term: Attend rehab  on a regular basis to increase amount of physical activity.;Long Term: Add in home exercise to make exercise part of routine and to increase  amount of physical activity.;Long Term: Exercising regularly at least 3-5 days a week.       Increase Strength and Stamina Yes       Intervention Provide advice, education, support and counseling about physical activity/exercise needs.;Develop an individualized exercise prescription for aerobic and resistive training based on initial evaluation findings, risk stratification, comorbidities and participant's personal goals.       Expected Outcomes Short Term: Increase workloads from initial exercise prescription for resistance, speed, and METs.;Short Term: Perform resistance training exercises routinely during rehab and add in resistance training at home;Long Term: Improve cardiorespiratory fitness, muscular endurance and strength as measured by increased METs and functional capacity (6MWT)       Able to understand and use rate of perceived exertion (RPE) scale Yes       Intervention Provide education and explanation on how to use RPE scale       Expected Outcomes Short Term: Able to use RPE daily in rehab to express subjective intensity level;Long Term:  Able to use RPE to guide intensity level when exercising independently       Knowledge and understanding of Target Heart Rate Range (THRR) Yes       Intervention Provide education and explanation of THRR including how the numbers were predicted and where they are located for reference       Expected Outcomes Short Term: Able to state/look up THRR;Short Term: Able to use daily as guideline for intensity in rehab;Long Term: Able to use THRR to govern intensity when exercising independently       Able to check pulse independently Yes       Intervention Provide education and demonstration on how to check pulse in carotid and radial arteries.;Review the importance of being able to check your own pulse for safety during independent exercise       Expected Outcomes Short Term: Able to explain why pulse checking is important during independent exercise;Long Term:  Able to check pulse independently and accurately       Understanding of Exercise Prescription Yes       Intervention Provide education, explanation, and written materials on patient's individual exercise prescription       Expected Outcomes Short Term: Able to explain program exercise prescription;Long Term: Able to explain home exercise prescription to exercise independently              Exercise Goals Re-Evaluation:  Exercise Goals Re-Evaluation    Row Name 10/25/19 1356 11/15/19 1139 12/01/19 1122 12/14/19 1126       Exercise Goal Re-Evaluation   Exercise Goals Review Increase Physical Activity;Knowledge and understanding of Target Heart Rate Range (THRR);Understanding of Exercise Prescription Increase Physical Activity;Knowledge and understanding of Target Heart Rate Range (THRR);Understanding of Exercise Prescription;Increase Strength and Stamina;Able to check pulse independently;Able to understand and use rate of perceived exertion (RPE) scale Increase Physical Activity;Knowledge and understanding of Target Heart Rate Range (THRR);Understanding of Exercise Prescription;Increase Strength and Stamina;Able to check pulse independently;Able to understand and use rate of perceived exertion (RPE) scale Increase Physical Activity;Knowledge and understanding of Target Heart Rate Range (THRR);Understanding of Exercise Prescription;Increase Strength and Stamina;Able to check pulse independently;Able to understand and use rate of perceived exertion (RPE) scale    Comments Pt's first day of cardaic rehab. Pt able to exercise 30 minutes with minimal difficulty. Will continue to monitor. Reviewed  home exercise guidelines with patient including endpoints, temperature precautions, target heart rate and rate of perceived exertion. Patient is stretching some at home but is not walking. Encouraged patient to add 1-2 days/week walking 30 minutes either 15 minutes twice/day or 30 minutes once/day as his mode of  home exercise, and patient is agreeable to this. Patient states that he is able to manually count is pulse. Patient states that he's walking about 30 minutes, 3 days/week as his mode of home exercise. Patient still gets tired, but is feeling better since starting the program. Patient will complete the cardiac rehab program on 12/17/19. Patient plans to continue walking at least 30 minutes, 3 days/week as his mode exericse.    Expected Outcomes Pt will continue to increase strength and stamina. Patient will add 30 minutes of walking 1-2 days/week at home in addition to exercise at cardiac rehab to help achieve personal health and fitness goals. Patient will continue walking at home in addition to exercise at cardiac rehab to help build stamina and endurance. Patient will continue walking upon completion of the cardiac rehab program to maintain health and fitness gains.           Nutrition & Weight - Outcomes:  Pre Biometrics - 10/21/19 0930      Pre Biometrics   Waist Circumference 48 inches    Hip Circumference 49.5 inches    Waist to Hip Ratio 0.97 %    Triceps Skinfold 15 mm    % Body Fat 36.1 %    Grip Strength 35 kg    Flexibility 0 in   Pt could not reach box   Single Leg Stand 2.37 seconds   High risk for fall          Post Biometrics - 12/08/19 1444       Post  Biometrics   Height 5' 6.75" (1.695 m)    Weight 104.6 kg    Waist Circumference 48 inches    Hip Circumference 49 inches    Waist to Hip Ratio 0.98 %    BMI (Calculated) 36.41    Triceps Skinfold 16 mm    % Body Fat 35.4 %    Grip Strength 44 kg    Flexibility 0 in   Unable to reach box   Single Leg Stand 3.03 seconds           Nutrition:  Nutrition Therapy & Goals - 11/01/19 1324      Nutrition Therapy   Diet Heart healthy/carb modified    Drug/Food Interactions Statins/Certain Fruits      Personal Nutrition Goals   Nutrition Goal Pt to reduce night time snacking    Personal Goal #2 Pt to identify  food quantities necessary to achieve weight loss of 6-24 lb at graduation from cardiac rehab.    Personal Goal #3 Pt to build a healthy plate including vegetables, fruits, whole grains, and low-fat dairy products in a heart healthy meal plan.      Intervention Plan   Intervention Prescribe, educate and counsel regarding individualized specific dietary modifications aiming towards targeted core components such as weight, hypertension, lipid management, diabetes, heart failure and other comorbidities.    Expected Outcomes Short Term Goal: Understand basic principles of dietary content, such as calories, fat, sodium, cholesterol and nutrients.           Nutrition Discharge:  Nutrition Assessments - 11/01/19 1325      MEDFICTS Scores   Pre Score 61  Education Questionnaire Score:  Knowledge Questionnaire Score - 12/14/19 1309      Knowledge Questionnaire Score   Pre Score 20/24    Post Score 23/24           Goals reviewed with patient; copy given to patient.

## 2019-12-17 NOTE — Progress Notes (Signed)
Blood pressures from today were as follows. Entry blood pressures was 94/50 with a recheck blood pressure of 96/60. Rebel was asymptomatic but did report that he drank a few cups of coffee and did not drink any water this morning. Resting heart rate 91.  Patient was given 8 ounces of water. Recheck blood pressure 100/60 via automatic cuff with a recheck BP of 108/64. Had patient drink another bottle of water. No complaints during exercise today. Exit blood pressure 112/71 heart rate 87. Medications reviewed. Will forward today's vital signs to Dr Tamala Julian for review as Trevis completes cardiac rehab today. Medications reviewed. Instructed the Guillermo to make sure that he is drinking enough water throughout the day.Barnet Pall, RN,BSN 12/17/2019 12:38 PM

## 2019-12-20 NOTE — Progress Notes (Addendum)
[facility], [place] to [address] [place] [address] Phone: [phone] Fax: [phone]  [facility] [place], [place] [address] [place] [address] Phone: [phone] Fax: [phone]      Your procedure is scheduled on Thursday December 9th.  Report to [facility] Main Entrance "A" at 5:30 A.M., and check in at the Admitting office.  Call this number if you have problems the morning of surgery:  [phone]  Call [phone] if you have any questions prior to your surgery date Monday-Friday 8am-4pm    Remember:  Do not eat or drink anything after midnight the night before your surgery     Take these medicines the morning of surgery with A SIP OF WATER   amiodarone (PACERONE) 200 MG tablet  atorvastatin (LIPITOR) 80 MG tablet  carvedilol (COREG) 6.25 MG tablet  ezetimibe (ZETIA) 10 MG tablet  famotidine (PEPCID) 20 MG tablet  finasteride (PROSCAR) 5 MG tablet  levothyroxine (SYNTHROID) 75 MCG tablet  liothyronine (CYTOMEL) 5 MCG tablet  Plavix  tamsulosin (FLOMAX) 0.4 MG CAPS capsule    IF NEEDED  acetaminophen (TYLENOL) 500 MG tablet  colchicine 0.6 MG tablet  nitroGLYCERIN (NITROSTAT) 0.4 MG SL tablet   As of today, STOP taking any Aspirin (unless otherwise instructed by your surgeon) Aleve, Naproxen, Ibuprofen, Motrin, Advil, Goody's, BC's, all herbal medications, fish oil, and all vitamins.                      Do not wear jewelry            Do not wear lotions, powders, colognes, or deodorant.            Do not shave 48 hours prior to surgery.  Men may shave face and neck.            Do not bring valuables to the hospital.            [facility] is not responsible for any belongings or valuables.  Do NOT Smoke (Tobacco/Vaping) or drink Alcohol 24 hours prior to your procedure If you use a CPAP at  night, you may bring all equipment for your overnight stay.   Contacts, glasses, dentures or bridgework may not be worn into surgery.      For patients admitted to the hospital, discharge time will be determined by your treatment team.   Patients discharged the day of surgery will not be allowed to drive home, and someone needs to stay with them for 24 hours.    Special instructions:   [place]- Preparing For Surgery  Before surgery, you can play an important role. Because skin is not sterile, your skin needs to be as free of germs as possible. You can reduce the number of germs on your skin by washing with CHG (chlorahexidine gluconate) Soap before surgery.  CHG is an antiseptic cleaner which kills germs and bonds with the skin to continue killing germs even after washing.    Oral Hygiene is also important to reduce your risk of infection.  Remember - BRUSH YOUR TEETH THE MORNING OF SURGERY WITH YOUR REGULAR TOOTHPASTE  Please do not use if you have an allergy to CHG or antibacterial soaps. If your skin becomes reddened/irritated stop using the CHG.  Do not shave (including legs and underarms) for at least  48 hours prior to first CHG shower. It is OK to shave your face.  Please follow these instructions carefully.   1. Shower the NIGHT BEFORE SURGERY and the MORNING OF SURGERY with CHG Soap.   2. If you chose to wash your hair, wash your hair first as usual with your normal shampoo.  3. After you shampoo, rinse your hair and body thoroughly to remove the shampoo.  4. Use CHG as you would any other liquid soap. You can apply CHG directly to the skin and wash gently with a scrungie or a clean washcloth.   5. Apply the CHG Soap to your body ONLY FROM THE NECK DOWN.  Do not use on open wounds or open sores. Avoid contact with your eyes, ears, mouth and genitals (private parts). Wash Face and genitals (private parts)  with your normal soap.   6. Wash thoroughly, paying special  attention to the area where your surgery will be performed.  7. Thoroughly rinse your body with warm water from the neck down.  8. DO NOT shower/wash with your normal soap after using and rinsing off the CHG Soap.  9. Pat yourself dry with a CLEAN TOWEL.  10. Wear CLEAN PAJAMAS to bed the night before surgery  11. Place CLEAN SHEETS on your bed the night of your first shower and DO NOT SLEEP WITH PETS.   Day of Surgery: Wear Clean/Comfortable clothing the morning of surgery Do not apply any deodorants/lotions.   Remember to brush your teeth WITH YOUR REGULAR TOOTHPASTE.   Please read over the following fact sheets that you were given.

## 2019-12-21 ENCOUNTER — Other Ambulatory Visit (HOSPITAL_COMMUNITY): Payer: Medicare (Managed Care)

## 2019-12-21 ENCOUNTER — Encounter: Payer: Self-pay | Admitting: Physical Therapy

## 2019-12-21 ENCOUNTER — Encounter (HOSPITAL_COMMUNITY)
Admission: RE | Admit: 2019-12-21 | Discharge: 2019-12-21 | Disposition: A | Discharge status: Home / Self Care | Payer: Medicare (Managed Care) | Source: Ambulatory Visit | Attending: Cardiovascular Disease | Admitting: Cardiovascular Disease

## 2019-12-21 ENCOUNTER — Encounter (HOSPITAL_COMMUNITY): Payer: Self-pay

## 2019-12-21 ENCOUNTER — Ambulatory Visit: Payer: Medicare (Managed Care) | Attending: Cardiovascular Disease | Admitting: Physical Therapy

## 2019-12-21 ENCOUNTER — Ambulatory Visit (HOSPITAL_COMMUNITY): Admission: RE | Admit: 2019-12-21 | Payer: Medicare (Managed Care) | Source: Ambulatory Visit

## 2019-12-21 ENCOUNTER — Ambulatory Visit (HOSPITAL_COMMUNITY)
Admission: RE | Admit: 2019-12-21 | Discharge: 2019-12-21 | Disposition: A | Discharge status: Home / Self Care | Payer: Medicare (Managed Care) | Source: Ambulatory Visit | Attending: Cardiovascular Disease | Admitting: Cardiovascular Disease

## 2019-12-21 ENCOUNTER — Other Ambulatory Visit (HOSPITAL_COMMUNITY)
Admission: RE | Admit: 2019-12-21 | Discharge: 2019-12-21 | Disposition: A | Discharge status: Home / Self Care | Payer: Medicare (Managed Care) | Source: Ambulatory Visit | Attending: Cardiovascular Disease | Admitting: Cardiovascular Disease

## 2019-12-21 ENCOUNTER — Other Ambulatory Visit: Payer: Self-pay

## 2019-12-21 DIAGNOSIS — I517 Cardiomegaly: Secondary | ICD-10-CM | POA: Insufficient documentation

## 2019-12-21 DIAGNOSIS — Z6836 Body mass index (BMI) 36.0-36.9, adult: Secondary | ICD-10-CM | POA: Insufficient documentation

## 2019-12-21 DIAGNOSIS — I5023 Acute on chronic systolic (congestive) heart failure: Secondary | ICD-10-CM | POA: Insufficient documentation

## 2019-12-21 DIAGNOSIS — R2689 Other abnormalities of gait and mobility: Secondary | ICD-10-CM

## 2019-12-21 DIAGNOSIS — I110 Hypertensive heart disease with heart failure: Secondary | ICD-10-CM | POA: Insufficient documentation

## 2019-12-21 DIAGNOSIS — Z79899 Other long term (current) drug therapy: Secondary | ICD-10-CM | POA: Insufficient documentation

## 2019-12-21 DIAGNOSIS — E669 Obesity, unspecified: Secondary | ICD-10-CM | POA: Insufficient documentation

## 2019-12-21 DIAGNOSIS — I25810 Atherosclerosis of coronary artery bypass graft(s) without angina pectoris: Secondary | ICD-10-CM | POA: Insufficient documentation

## 2019-12-21 DIAGNOSIS — G4733 Obstructive sleep apnea (adult) (pediatric): Secondary | ICD-10-CM | POA: Insufficient documentation

## 2019-12-21 DIAGNOSIS — Z9989 Dependence on other enabling machines and devices: Secondary | ICD-10-CM | POA: Insufficient documentation

## 2019-12-21 DIAGNOSIS — E785 Hyperlipidemia, unspecified: Secondary | ICD-10-CM | POA: Insufficient documentation

## 2019-12-21 DIAGNOSIS — I5022 Chronic systolic (congestive) heart failure: Secondary | ICD-10-CM | POA: Insufficient documentation

## 2019-12-21 DIAGNOSIS — Z951 Presence of aortocoronary bypass graft: Secondary | ICD-10-CM | POA: Insufficient documentation

## 2019-12-21 DIAGNOSIS — Z79890 Hormone replacement therapy (postmenopausal): Secondary | ICD-10-CM | POA: Insufficient documentation

## 2019-12-21 DIAGNOSIS — I340 Nonrheumatic mitral (valve) insufficiency: Secondary | ICD-10-CM

## 2019-12-21 DIAGNOSIS — Z86718 Personal history of other venous thrombosis and embolism: Secondary | ICD-10-CM | POA: Insufficient documentation

## 2019-12-21 DIAGNOSIS — Z7902 Long term (current) use of antithrombotics/antiplatelets: Secondary | ICD-10-CM | POA: Insufficient documentation

## 2019-12-21 DIAGNOSIS — I252 Old myocardial infarction: Secondary | ICD-10-CM | POA: Insufficient documentation

## 2019-12-21 DIAGNOSIS — Z01818 Encounter for other preprocedural examination: Secondary | ICD-10-CM | POA: Insufficient documentation

## 2019-12-21 DIAGNOSIS — E039 Hypothyroidism, unspecified: Secondary | ICD-10-CM | POA: Insufficient documentation

## 2019-12-21 DIAGNOSIS — Z7901 Long term (current) use of anticoagulants: Secondary | ICD-10-CM | POA: Insufficient documentation

## 2019-12-21 DIAGNOSIS — E119 Type 2 diabetes mellitus without complications: Secondary | ICD-10-CM | POA: Insufficient documentation

## 2019-12-21 DIAGNOSIS — I480 Paroxysmal atrial fibrillation: Secondary | ICD-10-CM | POA: Insufficient documentation

## 2019-12-21 DIAGNOSIS — Z20822 Contact with and (suspected) exposure to covid-19: Secondary | ICD-10-CM | POA: Insufficient documentation

## 2019-12-21 HISTORY — DX: Acute myocardial infarction, unspecified: I21.9

## 2019-12-21 HISTORY — DX: Disorder of arteries and arterioles, unspecified: I77.9

## 2019-12-21 LAB — COMPREHENSIVE METABOLIC PANEL
ALT: 43 U/L (ref 0–44)
AST: 34 U/L (ref 15–41)
Albumin: 3.8 g/dL (ref 3.5–5.0)
Alkaline Phosphatase: 51 U/L (ref 38–126)
Anion gap: 10 (ref 5–15)
BUN: 21 mg/dL (ref 8–23)
CO2: 22 mmol/L (ref 22–32)
Calcium: 9.1 mg/dL (ref 8.9–10.3)
Chloride: 103 mmol/L (ref 98–111)
Creatinine, Ser: 1.23 mg/dL (ref 0.61–1.24)
GFR, Estimated: >60 mL/min (ref >60)
Glucose, Bld: 104 mg/dL — ABNORMAL HIGH (ref 70–99)
Potassium: 4.4 mmol/L (ref 3.5–5.1)
Sodium: 135 mmol/L (ref 135–145)
Total Bilirubin: 0.5 mg/dL (ref 0.3–1.2)
Total Protein: 6.6 g/dL (ref 6.5–8.1)

## 2019-12-21 LAB — BLOOD GAS, ARTERIAL
Acid-Base Excess: 0.7 mmol/L (ref 0.0–2.0)
Bicarbonate: 24.4 mmol/L (ref 20.0–28.0)
Drawn by: 602861
FIO2: 21
O2 Saturation: 97.2 %
Patient temperature: 37
pCO2 arterial: 36.9 mmHg (ref 32.0–48.0)
pH, Arterial: 7.437 (ref 7.350–7.450)
pO2, Arterial: 90.7 mmHg (ref 83.0–108.0)

## 2019-12-21 LAB — TYPE AND SCREEN
ABO/RH(D): A POS
Antibody Screen: NEGATIVE

## 2019-12-21 LAB — URINALYSIS, ROUTINE W REFLEX MICROSCOPIC
Bilirubin Urine: NEGATIVE
Glucose, UA: NEGATIVE mg/dL
Hgb urine dipstick: NEGATIVE
Ketones, ur: NEGATIVE mg/dL
Leukocytes,Ua: NEGATIVE
Nitrite: NEGATIVE
Protein, ur: NEGATIVE mg/dL
Specific Gravity, Urine: 1.009 (ref 1.005–1.030)
pH: 5 (ref 5.0–8.0)

## 2019-12-21 LAB — CBC
HCT: 44.7 % (ref 39.0–52.0)
Hemoglobin: 14.2 g/dL (ref 13.0–17.0)
MCH: 28.6 pg (ref 26.0–34.0)
MCHC: 31.8 g/dL (ref 30.0–36.0)
MCV: 89.9 fL (ref 80.0–100.0)
Platelets: 125 10*3/uL — ABNORMAL LOW (ref 150–400)
RBC: 4.97 MIL/uL (ref 4.22–5.81)
RDW: 14 % (ref 11.5–15.5)
WBC: 7.6 10*3/uL (ref 4.0–10.5)
nRBC: 0 % (ref 0.0–0.2)

## 2019-12-21 LAB — BRAIN NATRIURETIC PEPTIDE: B Natriuretic Peptide: 151.2 pg/mL — ABNORMAL HIGH (ref 0.0–100.0)

## 2019-12-21 LAB — PROTIME-INR
INR: 1 (ref 0.8–1.2)
Prothrombin Time: 13.2 seconds (ref 11.4–15.2)

## 2019-12-21 LAB — HEMOGLOBIN A1C
Hgb A1c MFr Bld: 6.7 % — ABNORMAL HIGH (ref 4.8–5.6)
Mean Plasma Glucose: 145.59 mg/dL

## 2019-12-21 LAB — SURGICAL PCR SCREEN
MRSA, PCR: NEGATIVE
Staphylococcus aureus: NEGATIVE

## 2019-12-21 LAB — SARS CORONAVIRUS 2 (TAT 6-24 HRS): SARS Coronavirus 2: NEGATIVE

## 2019-12-21 LAB — APTT: aPTT: 34 seconds (ref 24–36)

## 2019-12-21 NOTE — Progress Notes (Signed)
PCP - Dr. Riki Sheer Cardiologist - Dr. [name]  Chest x-ray - 12/21/19 EKG - 12/21/19 Stress Test - Years ago ECHO - 09/13/19 Cardiac Cath - 09/09/19  Sleep Study - Yes has OSA CPAP - nightly  DM - Denies  Blood Thinner Instructions:Eliquis last dose 12/20/19. Will continue Plavix through day of surgery per DR. Cooper's office  COVID TEST- 12/21/19  Anesthesia review: Yes cardiac history  Patient denies shortness of breath, fever, cough and chest pain at PAT appointment   All instructions explained to the patient, with a verbal understanding of the material. Patient agrees to go over the instructions while at home for a better understanding. Patient also instructed to self quarantine after being tested for COVID-19. The opportunity to ask questions was provided.

## 2019-12-21 NOTE — Therapy (Signed)
[place], [place], [address] Phone: [phone]   Fax:  [phone]  Physical Therapy Evaluation  Patient Details  Name: Raymond Christensen MRN: [id number] Date of Birth: [birth year] Referring Provider (PT): Dr [name]    Encounter Date: 12/21/2019   PT End of Session - 12/21/19 1503    Visit Number 1    Number of Visits 1    Date for PT Re-Evaluation 12/21/19    PT Start Time 1455    PT Stop Time 1522    PT Time Calculation (min) 27 min    Activity Tolerance Patient tolerated treatment well    Behavior During Therapy [place] for tasks assessed/performed           Past Medical History:  Diagnosis Date  . Arthritis   . CAD (coronary artery disease)   . Concussion Summer 2012  . Dysrhythmia 08/2019   a fib  . Essential hypertension 02/26/2017  . Heart disease   . Hyperlipidemia   . Hypothyroidism 10/15/2017  . Major depressive disorder   . Myocardial infarction ([place]) 1996, 2021  . Obstructive sleep apnea on CPAP   . Poor flexibility of tendon 12/01/2018  . Subungual hematoma of digit of hand 12/01/2018  . Subungual hematoma of right foot 12/01/2018  . Thyroid disease   . Type 2 diabetes mellitus without complication, without long-term current use of insulin ([place]) 02/26/2017    Past Surgical History:  Procedure Laterality Date  . BYPASS GRAFT  2001  . CARDIAC CATHETERIZATION    . CARDIOVERSION N/A 11/08/2019   Procedure: CARDIOVERSION;  Surgeon: [name], MD;  Location: [facility] ENDOSCOPY;  Service: Cardiovascular;  Laterality: N/A;  . CAROTID ARTERY ANGIOPLASTY    . CORONARY ANGIOPLASTY    . CORONARY ARTERY BYPASS GRAFT    . RIGHT/LEFT HEART CATH AND CORONARY/GRAFT ANGIOGRAPHY N/A 09/09/2019   Procedure: RIGHT/LEFT HEART CATH AND CORONARY/GRAFT ANGIOGRAPHY;  Surgeon: [name], MD;  Location: [place] CV LAB;  Service: Cardiovascular;  Laterality: N/A;  . TEE WITHOUT  CARDIOVERSION N/A 09/13/2019   Procedure: TRANSESOPHAGEAL ECHOCARDIOGRAM (TEE);  Surgeon: [name], MD;  Location: [facility] ENDOSCOPY;  Service: Cardiovascular;  Laterality: N/A;  . [place]    There were no vitals filed for this visit.    Subjective Assessment - 12/21/19 1459    Subjective Patient has had shortness of breath on exertion since 09/07/2019. He noticies it when he is walking for exercises and when he is riding his stationary bike.    Currently in Pain? No/denies   has pain in his little toe when he puts pressure on it             [facility] PT Assessment - 12/21/19 0001      Assessment   Medical Diagnosis Mitral valve prolapse     Referring Provider (PT) Dr [name]     Hand Dominance Right    Next MD Visit Nothing until the procedure     Prior Therapy None       Precautions   Precautions None      Restrictions   Weight Bearing Restrictions No      Balance Screen   Has the patient fallen in the past 6 months No    Has the patient had a decrease in activity level because of a fear of falling?  No    Is the patient reluctant to leave their home because of a fear  of falling?  No      Prior Function   Level of Independence Independent    Leisure Patient continues to be active despite shortness of breath       Cognition   Overall Cognitive Status Within Functional Limits for tasks assessed    Attention Focused    Focused Attention Appears intact    Memory Appears intact    Awareness Appears intact    Problem Solving Appears intact      Sensation   Light Touch Appears Intact    Additional Comments denies parathesias       Coordination   Gross Motor Movements are Fluid and Coordinated Yes    Fine Motor Movements are Fluid and Coordinated Yes      ROM / Strength   AROM / PROM / Strength AROM;PROM;Strength      AROM   Overall AROM Comments full ROM UE /LE       Strength   Overall Strength Comments 5/5 gross UE/LE              [facility] Pre-Surgical Assessment - 12/21/19 0001    5 Meter Walk Test- trial 1 5 sec    5 Meter Walk Test- trial 2 5 sec.     5 Meter Walk Test- trial 3 4 sec.    5 meter walk test average 4.67 sec    4 Stage Balance Test tolerated for:  10 sec.    4 Stage Balance Test Position 2    comment unabel to come to full tandem     Sit To Stand Test- trial 1 5 sec.    Comment 10 sec     ADL/IADL Independent with: Bathing;Dressing;Meal prep;Finances;Yard work    6 Minute Walk- Baseline yes    BP (mmHg) 149/86    HR (bpm) 102    02 Sat (%RA) 96 %    Modified Borg Scale for Dyspnea 0- Nothing at all    Perceived Rate of Exertion (Borg) 6-    6 Minute Walk Post Test yes    BP (mmHg) 194/81    HR (bpm) 124    02 Sat (%RA) 90 %    Modified Borg Scale for Dyspnea 2- Mild shortness of breath    Perceived Rate of Exertion (Borg) 10-    Endurance additional comments 1729                    Objective measurements completed on examination: See above findings.               PT Education - 12/21/19 1617    Education Details pupose of testing    Person(s) Educated Patient    Methods Explanation;Demonstration;Tactile cues;Verbal cues    Comprehension Verbalized understanding;Returned demonstration;Verbal cues required;Tactile cues required                       Plan - 12/21/19 1618    Clinical Impression Statement see below    Stability/Clinical Decision Making Stable/Uncomplicated    Clinical Decision Making Low    Rehab Potential Excellent    PT Frequency One time visit    PT Treatment/Interventions Patient/family education          Clinical Impression Statement: Pt is a 79 yo male presenting to OP PT for evaluation prior to possible Mitraclip surgery due to severe mitral regurgitation. Pt reports onset of dyspnea with exertion approximately 4 months ago. Symptoms are limiting ability to exercise. Pt  presents with normal ROM and strength,  decreased balance and is at high fall risk 4 stage balance test, decreased  walking speed and decreased aerobic endurance per 6 minute walk test. Pt ambulated 1729 feet in 6 min . At time of rest, patient's HR was 124 bpm and O2 was 90 on room air. Pt reported 2/10 shortness of breath on modified scale for dyspnea. B/P increased significantly with 6 minute walk test. Based on the Short Physical Performance Battery, patient has a frailty rating of 9/12 with </= 5/12 considered frail.    Patient will benefit from skilled therapeutic intervention in order to improve the following deficits and impairments:  Difficulty walking, Cardiopulmonary status limiting activity, Decreased endurance  Visit Diagnosis: Other abnormalities of gait and mobility     Problem List Patient Active Problem List   Diagnosis Date Noted  . Unstable angina ([place])   . Nonrheumatic mitral valve regurgitation   . Persistent atrial fibrillation ([place])   . Chronic systolic heart failure ([place])   . Status post coronary artery stent placement   . Acute idiopathic gout involving toe of left foot   . Elevated troponin 09/08/2019  . Thrombocytopenia ([place]) 09/08/2019  . NSTEMI (non-ST elevated myocardial infarction) ([place]) 09/08/2019  . Paroxysmal atrial fibrillation ([place]) 09/08/2019  . CAD (coronary artery disease) of artery bypass graft 09/08/2019  . Bronchitis 08/19/2019  . Diabetes mellitus type 2 in obese ([place]) 06/22/2019  . Irritability and anger 01/18/2019  . Major depressive disorder   . Obstructive sleep apnea on CPAP   . Poor flexibility of tendon 12/01/2018  . Subungual hematoma of digit of hand 12/01/2018  . Choking 12/01/2018  . Subungual hematoma of right foot 12/01/2018  . Hypothyroidism 10/15/2017  . Type 2 diabetes mellitus without complication, without long-term current use of insulin ([place]) 02/26/2017  . Morbid obesity ([place]) 02/26/2017  . Essential hypertension 02/26/2017    Carney Living PT DPT   12/21/2019, 4:21 PM  [facility] [address] [place], [place], [address] Phone: [phone]   Fax:  [phone]  Name: Henrik Orihuela MRN: [id number] Date of Birth: 1940/01/27

## 2019-12-21 NOTE — Progress Notes (Deleted)
PCP - Dr. Marco Collie in [place], [place] Cardiologist - Denies  Chest x-ray - Not indicated EKG - 12/21/19 Stress Test - Years ago no f/u needed ECHO - Denies Cardiac Cath - Denies  Sleep Study - Denies  DM - Type II CBG at PAT appt 121 Fasting Blood Sugar - 110-117 Checks Blood Sugar __4-5___ times a week  Aspirin Instructions:Stopped aspirin per Dr. Reatha Armour. Last dose 12/16/19  COVID TEST- 12/21/19  Anesthesia review: No  Patient denies shortness of breath, fever, cough and chest pain at PAT appointment   All instructions explained to the patient, with a verbal understanding of the material. Patient agrees to go over the instructions while at home for a better understanding. Patient also instructed to self quarantine after being tested for COVID-19. The opportunity to ask questions was provided.

## 2019-12-21 NOTE — Progress Notes (Signed)
Anesthesia Chart Review:  Case: [id number] Date/Time: 12/23/19 0730   Procedures:      MITRAL VALVE REPAIR (N/A )     TRANSESOPHAGEAL ECHOCARDIOGRAM (TEE) (N/A )   Anesthesia type: General   Pre-op diagnosis: Severe Mitral Valve Insufficiency   Location: [facility] CATH LAB 6 / [place] INVASIVE CV LAB   Providers: [name], MD      DISCUSSION: Patient is a 79 year old male scheduled for the above procedure.  History includes former smoker, CAD (MI ~1994, s/p thrombolytics/PTCA x2; CABG 11/1999: "LIMA to LAD, SVG to OM-2, SVG to right PDA, SVG to D-2", [facility]; NSTEMI 09/07/19, s/p DES x2 to sequential graft to Ramus, 2nd Mrg, RPDA 09/09/19), mitral regurgitation, afib/flutter (PAF 2001; afib 08/2019; DCCV x4 11/08/19, recurrent afib 11/17/19), LA appendage thrombus (09/13/19 TEE), chronic systolic CHF, DM2, HLD, HTN, hypothyroidism, OSA (CPAP), carotid artery disease (s/p left carotid endarterectomy 01/17/14, [facility]). BMI is consistent with obesity. 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  Per EP note by Dr. Curt Bears 12/02/19, Mitral valve clip prior to attempt at resumption of SR would be most beneficial as MR may be contributing to afib. Ablation or repeat cardioversion will be considered in the future (patient favors ablation).   DES x2 09/09/19. Known afib and MR. Last Eliquis 12/20/2019.  He will continue Plavix through day of surgery per cardiology.  12/21/2019 presurgical COVID-19 test in process.  Anesthesia team to evaluate on the day of surgery.   VS: BP 125/71   Pulse (!) 55   Temp 36.6 C   Resp 18   Ht 5\' 8"  (1.727 m)   Wt 109.5 kg   SpO2 100%   BMI 36.70 kg/m    PROVIDERS: [name], DO his PCP [name], MD is cardiologist. Previously he saw [name], MD with [facility], but transitioned care to closer to home in [facility] following 10/2019 visit.  Allegra Lai, MD is EP cardiologist   LABS: Labs reviewed: Acceptable for surgery. PLT 125K is  consistent with previous results in August and October 2021. (all labs ordered are listed, but only abnormal results are displayed)  Labs Reviewed  BLOOD GAS, ARTERIAL - Abnormal; Notable for the following components:      Result Value   Allens test (pass/fail) BRACHIAL ARTERY (*)    All other components within normal limits  BRAIN NATRIURETIC PEPTIDE - Abnormal; Notable for the following components:   B Natriuretic Peptide 151.2 (*)    All other components within normal limits  CBC - Abnormal; Notable for the following components:   Platelets 125 (*)    All other components within normal limits  COMPREHENSIVE METABOLIC PANEL - Abnormal; Notable for the following components:   Glucose, Bld 104 (*)    All other components within normal limits  HEMOGLOBIN A1C - Abnormal; Notable for the following components:   Hgb A1c MFr Bld 6.7 (*)    All other components within normal limits  SURGICAL PCR SCREEN  APTT  PROTIME-INR  URINALYSIS, ROUTINE W REFLEX MICROSCOPIC  TYPE AND SCREEN    IMAGES: CXR 09/07/19: FINDINGS: Cardiac shadow is enlarged. Postsurgical changes are noted. The lungs are clear. Degenerative changes of the thoracic spine are noted. IMPRESSION: No active cardiopulmonary disease.   EKG:  EKG 12/21/19: Ventricular rate 64 bpm Atrial flutter with variable A-V block Abnormal ECG - Notified Nell Range, PA-C that patient in rate controlled a-flutter (known afib)  EKG 12/02/19: Afib at 77 bpm   CV: TEE 09/13/19: IMPRESSIONS  1. Left ventricular ejection fraction, by estimation, is 40 to 45%. The  left ventricle has mildly decreased function. Left ventricular endocardial  border not optimally defined to evaluate regional wall motion.  2. Right ventricular systolic function is normal. The right ventricular  size is normal.  3. A left atrial/left atrial appendage thrombus was detected.  4. Moderate to severe regurgitation; severe by ERO and MR radius,   moderate by Rvol. Unable to pulse PV as he was not tolerating procedure  well at the time and expedited images were obtained. There appears to be a  ruptured chord and partial prolapsed  leaflet of A2. Taken together, highly suggestive of severe eccentric MR..  The mitral valve is degenerative. Moderate to severe mitral valve  regurgitation.  5. The aortic valve is tricuspid. Aortic valve regurgitation is mild. No  aortic stenosis is present.  6. There is Moderate (Grade III) plaque involving the descending aorta.  - Conclusion(s)/Recommendation(s): Likely severe MR given measurements and  eccentric jet. There is thrombus of the left atrial appendage, so  cardioversion not attempted.    Cardiac cath 09/09/19:  Mid LM to Dist LM lesion is 70% stenosed.  1st Mrg lesion is 75% stenosed.  2nd Mrg-1 lesion is 90% stenosed.  2nd Mrg-2 lesion is 95% stenosed.  Mid RCA to Dist RCA lesion is 100% stenosed.  Prox RCA to Mid RCA lesion is 90% stenosed.  2nd Diag lesion is 75% stenosed.  Origin to Prox Graft lesion between 2nd Mrg and RPDA is 99% stenosed.  Prox Graft to Mid Graft lesion between Ramus and 2nd Mrg is 75% stenosed.  A drug-eluting stent was successfully placed using a STENT RESOLUTE ONYX 3.0X18.  Post intervention, there is a 0% residual stenosis.  A drug-eluting stent was successfully placed.  Post intervention, there is a 0% residual stenosis.  Hemodynamic findings consistent with mitral valve regurgitation.   Carotid US 06/16/17 ([facility]): Impression:  - Right carotid artery: Duplex imaging demonstrates no evidence of a  hemodynamically significant stenosis. The internal carotid artery  demonstrates a less than 50% diameter reduction. Irregular heterogeneous  plaque is noted at thecarotid bulb. The common carotid artery  demonstrates a <50% stenosis. The external carotid artery demonstrates a  <50% stenosis.  - The right vertebral artery demonstrates  antegrade flow. The subclavian  artery is patent with triphasic flow. Pleasesee velocities above.  - Left carotid artery: Duplex imaging demonstrates no evidence of a  hemodynamically significant stenosis. Patent carotid artery s/p CEA. No  evidence of recurrent or residual stenosis. The internal carotid artery  demonstrates a less than 50% diameter reduction. No significant plaque is  noted. The common carotid artery demonstrates a <50% stenosis. The  external carotid artery demonstrates a <50% stenosis.  - The left vertebral artery demonstrates antegrade flow. The subclavian  artery is patent with triphasic flow. Please see velocities above.     Past Medical History:  Diagnosis Date  . Arthritis   . CAD (coronary artery disease)   . Carotid artery disease ([place])    s/p left CEA 01/17/14  . CHF (congestive heart failure) ([place])   . Concussion Summer 2012  . Dysrhythmia 08/2019   a fib  . Essential hypertension 02/26/2017  . Heart disease   . Hyperlipidemia   . Hypothyroidism 10/15/2017  . Major depressive disorder   . Myocardial infarction ([place]) 1996, 2021  . Obstructive sleep apnea on CPAP   . Poor flexibility of tendon 12/01/2018  . Subungual hematoma  of digit of hand 12/01/2018  . Subungual hematoma of right foot 12/01/2018  . Thyroid disease   . Type 2 diabetes mellitus without complication, without long-term current use of insulin ([place]) 02/26/2017    Past Surgical History:  Procedure Laterality Date  . BYPASS GRAFT  2001  . CARDIAC CATHETERIZATION    . CARDIOVERSION N/A 11/08/2019   Procedure: CARDIOVERSION;  Surgeon: [name], MD;  Location: [facility] ENDOSCOPY;  Service: Cardiovascular;  Laterality: N/A;  . CAROTID ENDARTERECTOMY Left 01/17/2014  . CORONARY ANGIOPLASTY    . CORONARY ARTERY BYPASS GRAFT    . RIGHT/LEFT HEART CATH AND CORONARY/GRAFT ANGIOGRAPHY N/A 09/09/2019   Procedure: RIGHT/LEFT HEART CATH AND CORONARY/GRAFT ANGIOGRAPHY;  Surgeon: [name],  MD;  Location: [place] CV LAB;  Service: Cardiovascular;  Laterality: N/A;  . TEE WITHOUT CARDIOVERSION N/A 09/13/2019   Procedure: TRANSESOPHAGEAL ECHOCARDIOGRAM (TEE);  Surgeon: [name], MD;  Location: [facility] ENDOSCOPY;  Service: Cardiovascular;  Laterality: N/A;  . [place]: . acetaminophen (TYLENOL) 500 MG tablet  . amiodarone (PACERONE) 200 MG tablet  . apixaban (ELIQUIS) 5 MG TABS tablet  . atorvastatin (LIPITOR) 80 MG tablet  . busPIRone (BUSPAR) 7.5 MG tablet  . carvedilol (COREG) 6.25 MG tablet  . Cholecalciferol (VITAMIN D3) 250 MCG (10000 UT) capsule  . clopidogrel (PLAVIX) 75 MG tablet  . colchicine 0.6 MG tablet  . ezetimibe (ZETIA) 10 MG tablet  . famotidine (PEPCID) 20 MG tablet  . finasteride (PROSCAR) 5 MG tablet  . furosemide (LASIX) 40 MG tablet  . levocetirizine (XYZAL) 5 MG tablet  . levothyroxine (SYNTHROID) 75 MCG tablet  . liothyronine (CYTOMEL) 5 MCG tablet  . Multiple Vitamins-Minerals (MULTIVITAMIN ADULT EXTRA C PO)  . nitroGLYCERIN (NITROSTAT) 0.4 MG SL tablet  . omega-3 acid ethyl esters (LOVAZA) 1 g capsule  . potassium chloride (KLOR-CON 10) 10 MEQ tablet  . sacubitril-valsartan (ENTRESTO) 24-26 MG  . spironolactone (ALDACTONE) 25 MG tablet  . tamsulosin (FLOMAX) 0.4 MG CAPS capsule  . vitamin B-12 (CYANOCOBALAMIN) 1000 MCG tablet   No current facility-administered medications for this encounter.    Myra Gianotti, PA-C Surgical Short Stay/Anesthesiology [facility] Phone [phone] [facility] Phone [phone] 12/21/2019 5:41 PM

## 2019-12-23 ENCOUNTER — Encounter (HOSPITAL_COMMUNITY)
Admission: RE | Disposition: A | Discharge status: Home / Self Care | Payer: Medicare (Managed Care) | Source: Home / Self Care | Attending: Cardiovascular Disease

## 2019-12-23 ENCOUNTER — Inpatient Hospital Stay (HOSPITAL_COMMUNITY)
Admission: RE | Admit: 2019-12-23 | Discharge: 2019-12-25 | DRG: 266 | Disposition: A | Discharge status: Home / Self Care | Payer: Medicare (Managed Care) | Attending: Cardiovascular Disease | Admitting: Cardiovascular Disease

## 2019-12-23 ENCOUNTER — Inpatient Hospital Stay (HOSPITAL_COMMUNITY): Payer: Medicare (Managed Care) | Admitting: Anesthesiology

## 2019-12-23 ENCOUNTER — Inpatient Hospital Stay (HOSPITAL_COMMUNITY): Payer: Medicare (Managed Care)

## 2019-12-23 ENCOUNTER — Encounter (HOSPITAL_COMMUNITY): Payer: Self-pay | Admitting: Cardiovascular Disease

## 2019-12-23 ENCOUNTER — Other Ambulatory Visit: Payer: Self-pay | Admitting: Physician Assistant

## 2019-12-23 ENCOUNTER — Other Ambulatory Visit: Payer: Self-pay

## 2019-12-23 ENCOUNTER — Inpatient Hospital Stay (HOSPITAL_COMMUNITY): Payer: Medicare (Managed Care) | Admitting: Vascular Surgery

## 2019-12-23 DIAGNOSIS — I340 Nonrheumatic mitral (valve) insufficiency: Secondary | ICD-10-CM | POA: Diagnosis present

## 2019-12-23 DIAGNOSIS — Z7901 Long term (current) use of anticoagulants: Secondary | ICD-10-CM | POA: Diagnosis not present

## 2019-12-23 DIAGNOSIS — I10 Essential (primary) hypertension: Secondary | ICD-10-CM | POA: Diagnosis present

## 2019-12-23 DIAGNOSIS — I2582 Chronic total occlusion of coronary artery: Secondary | ICD-10-CM | POA: Diagnosis present

## 2019-12-23 DIAGNOSIS — I25709 Atherosclerosis of coronary artery bypass graft(s), unspecified, with unspecified angina pectoris: Secondary | ICD-10-CM | POA: Diagnosis present

## 2019-12-23 DIAGNOSIS — D696 Thrombocytopenia, unspecified: Secondary | ICD-10-CM | POA: Diagnosis present

## 2019-12-23 DIAGNOSIS — E039 Hypothyroidism, unspecified: Secondary | ICD-10-CM | POA: Diagnosis present

## 2019-12-23 DIAGNOSIS — Z79899 Other long term (current) drug therapy: Secondary | ICD-10-CM | POA: Diagnosis not present

## 2019-12-23 DIAGNOSIS — I5022 Chronic systolic (congestive) heart failure: Secondary | ICD-10-CM | POA: Insufficient documentation

## 2019-12-23 DIAGNOSIS — Z20822 Contact with and (suspected) exposure to covid-19: Secondary | ICD-10-CM | POA: Diagnosis present

## 2019-12-23 DIAGNOSIS — I2510 Atherosclerotic heart disease of native coronary artery without angina pectoris: Secondary | ICD-10-CM | POA: Diagnosis present

## 2019-12-23 DIAGNOSIS — T81718A Complication of other artery following a procedure, not elsewhere classified, initial encounter: Secondary | ICD-10-CM | POA: Diagnosis not present

## 2019-12-23 DIAGNOSIS — Z79890 Hormone replacement therapy (postmenopausal): Secondary | ICD-10-CM | POA: Diagnosis not present

## 2019-12-23 DIAGNOSIS — I4819 Other persistent atrial fibrillation: Secondary | ICD-10-CM | POA: Diagnosis present

## 2019-12-23 DIAGNOSIS — F329 Major depressive disorder, single episode, unspecified: Secondary | ICD-10-CM | POA: Diagnosis present

## 2019-12-23 DIAGNOSIS — I361 Nonrheumatic tricuspid (valve) insufficiency: Secondary | ICD-10-CM | POA: Diagnosis not present

## 2019-12-23 DIAGNOSIS — N1831 Chronic kidney disease, stage 3a: Secondary | ICD-10-CM | POA: Diagnosis present

## 2019-12-23 DIAGNOSIS — Z954 Presence of other heart-valve replacement: Secondary | ICD-10-CM | POA: Diagnosis not present

## 2019-12-23 DIAGNOSIS — Z9889 Other specified postprocedural states: Secondary | ICD-10-CM

## 2019-12-23 DIAGNOSIS — I4892 Unspecified atrial flutter: Secondary | ICD-10-CM | POA: Diagnosis present

## 2019-12-23 DIAGNOSIS — Z96649 Presence of unspecified artificial hip joint: Secondary | ICD-10-CM | POA: Diagnosis present

## 2019-12-23 DIAGNOSIS — Z9861 Coronary angioplasty status: Secondary | ICD-10-CM

## 2019-12-23 DIAGNOSIS — Z006 Encounter for examination for normal comparison and control in clinical research program: Secondary | ICD-10-CM

## 2019-12-23 DIAGNOSIS — I083 Combined rheumatic disorders of mitral, aortic and tricuspid valves: Secondary | ICD-10-CM | POA: Diagnosis present

## 2019-12-23 DIAGNOSIS — R600 Localized edema: Secondary | ICD-10-CM

## 2019-12-23 DIAGNOSIS — Z87891 Personal history of nicotine dependence: Secondary | ICD-10-CM

## 2019-12-23 DIAGNOSIS — G4733 Obstructive sleep apnea (adult) (pediatric): Secondary | ICD-10-CM | POA: Diagnosis present

## 2019-12-23 DIAGNOSIS — Z95818 Presence of other cardiac implants and grafts: Secondary | ICD-10-CM

## 2019-12-23 DIAGNOSIS — E119 Type 2 diabetes mellitus without complications: Secondary | ICD-10-CM

## 2019-12-23 DIAGNOSIS — I130 Hypertensive heart and chronic kidney disease with heart failure and stage 1 through stage 4 chronic kidney disease, or unspecified chronic kidney disease: Secondary | ICD-10-CM | POA: Diagnosis present

## 2019-12-23 DIAGNOSIS — E1122 Type 2 diabetes mellitus with diabetic chronic kidney disease: Secondary | ICD-10-CM | POA: Diagnosis present

## 2019-12-23 DIAGNOSIS — I252 Old myocardial infarction: Secondary | ICD-10-CM

## 2019-12-23 DIAGNOSIS — I255 Ischemic cardiomyopathy: Secondary | ICD-10-CM | POA: Diagnosis present

## 2019-12-23 DIAGNOSIS — I462 Cardiac arrest due to underlying cardiac condition: Secondary | ICD-10-CM | POA: Diagnosis not present

## 2019-12-23 DIAGNOSIS — R310 Gross hematuria: Secondary | ICD-10-CM | POA: Diagnosis not present

## 2019-12-23 DIAGNOSIS — E785 Hyperlipidemia, unspecified: Secondary | ICD-10-CM | POA: Diagnosis present

## 2019-12-23 DIAGNOSIS — Y838 Other surgical procedures as the cause of abnormal reaction of the patient, or of later complication, without mention of misadventure at the time of the procedure: Secondary | ICD-10-CM | POA: Diagnosis not present

## 2019-12-23 DIAGNOSIS — Y92234 Operating room of hospital as the place of occurrence of the external cause: Secondary | ICD-10-CM | POA: Diagnosis not present

## 2019-12-23 DIAGNOSIS — Z6836 Body mass index (BMI) 36.0-36.9, adult: Secondary | ICD-10-CM

## 2019-12-23 DIAGNOSIS — Z7902 Long term (current) use of antithrombotics/antiplatelets: Secondary | ICD-10-CM

## 2019-12-23 DIAGNOSIS — I25810 Atherosclerosis of coronary artery bypass graft(s) without angina pectoris: Secondary | ICD-10-CM | POA: Diagnosis present

## 2019-12-23 DIAGNOSIS — I059 Rheumatic mitral valve disease, unspecified: Secondary | ICD-10-CM | POA: Diagnosis present

## 2019-12-23 DIAGNOSIS — E669 Obesity, unspecified: Secondary | ICD-10-CM | POA: Diagnosis present

## 2019-12-23 DIAGNOSIS — Z818 Family history of other mental and behavioral disorders: Secondary | ICD-10-CM

## 2019-12-23 DIAGNOSIS — I480 Paroxysmal atrial fibrillation: Secondary | ICD-10-CM | POA: Diagnosis present

## 2019-12-23 DIAGNOSIS — E1169 Type 2 diabetes mellitus with other specified complication: Secondary | ICD-10-CM | POA: Diagnosis present

## 2019-12-23 DIAGNOSIS — N400 Enlarged prostate without lower urinary tract symptoms: Secondary | ICD-10-CM | POA: Diagnosis present

## 2019-12-23 HISTORY — PX: MITRAL VALVE REPAIR: CATH118311

## 2019-12-23 HISTORY — DX: Nonrheumatic mitral (valve) insufficiency: I34.0

## 2019-12-23 HISTORY — DX: Other persistent atrial fibrillation: I48.19

## 2019-12-23 HISTORY — PX: TEE WITHOUT CARDIOVERSION: SHX5443

## 2019-12-23 HISTORY — DX: Other specified postprocedural states: Z98.890

## 2019-12-23 HISTORY — DX: Presence of other cardiac implants and grafts: Z95.818

## 2019-12-23 HISTORY — DX: Chronic systolic (congestive) heart failure: I50.22

## 2019-12-23 HISTORY — PX: TRANSCATHETER MITRAL EDGE TO EDGE REPAIR: CATH118311

## 2019-12-23 LAB — ECHO TEE
MV M vel: 4.01 m/s
MV Peak grad: 64.3 mmHg
Radius: 1.2 cm

## 2019-12-23 LAB — POCT ACTIVATED CLOTTING TIME
Activated Clotting Time: 392 seconds
Activated Clotting Time: 338 seconds

## 2019-12-23 LAB — GLUCOSE, CAPILLARY
Glucose-Capillary: 123 mg/dL — ABNORMAL HIGH (ref 70–99)
Glucose-Capillary: 182 mg/dL — ABNORMAL HIGH (ref 70–99)
Glucose-Capillary: 184 mg/dL — ABNORMAL HIGH (ref 70–99)

## 2019-12-23 LAB — ABO/RH: ABO/RH(D): A POS

## 2019-12-23 SURGERY — MITRAL VALVE REPAIR
Anesthesia: General

## 2019-12-23 MED ORDER — SODIUM CHLORIDE 0.9 % IV SOLN: INTRAVENOUS | Status: DC

## 2019-12-23 MED ORDER — ALBUMIN HUMAN 5 % IV SOLN
12.5000 g | Freq: Once | INTRAVENOUS | Status: AC
  Administered 2019-12-23: 12.5 g via INTRAVENOUS
  Filled 2019-12-23: qty 250

## 2019-12-23 MED ORDER — PHENYLEPHRINE HCL-NACL 10-0.9 MG/250ML-% IV SOLN
INTRAVENOUS | Status: DC | PRN
  Administered 2019-12-23: 20 ug/min via INTRAVENOUS

## 2019-12-23 MED ORDER — SACUBITRIL-VALSARTAN 24-26 MG PO TABS
1.0000 | ORAL_TABLET | Freq: Two times a day (BID) | ORAL | Status: DC
  Administered 2019-12-23 – 2019-12-25 (×4): 1 via ORAL
  Filled 2019-12-23 (×4): qty 1

## 2019-12-23 MED ORDER — FAMOTIDINE 20 MG PO TABS
20.0000 mg | ORAL_TABLET | Freq: Every day | ORAL | Status: DC
  Administered 2019-12-24 – 2019-12-25 (×2): 20 mg via ORAL
  Filled 2019-12-23 (×2): qty 1

## 2019-12-23 MED ORDER — SODIUM CHLORIDE 0.9 % IV SOLN: 250.0000 mL | INTRAVENOUS | Status: DC | PRN

## 2019-12-23 MED ORDER — ALBUMIN HUMAN 5 % IV SOLN
INTRAVENOUS | Status: AC
  Filled 2019-12-23: qty 250

## 2019-12-23 MED ORDER — LEVOCETIRIZINE DIHYDROCHLORIDE 5 MG PO TABS: 5.0000 mg | ORAL_TABLET | Freq: Every evening | ORAL | Status: DC

## 2019-12-23 MED ORDER — ATROPINE SULFATE 0.4 MG/ML IJ SOLN
INTRAMUSCULAR | Status: DC | PRN
  Administered 2019-12-23: .4 mg via INTRAVENOUS
  Administered 2019-12-23: .2 mg via INTRAVENOUS

## 2019-12-23 MED ORDER — ALBUMIN HUMAN 5 % IV SOLN: INTRAVENOUS | Status: DC | PRN

## 2019-12-23 MED ORDER — ATORVASTATIN CALCIUM 80 MG PO TABS
80.0000 mg | ORAL_TABLET | Freq: Every day | ORAL | Status: DC
  Administered 2019-12-24 – 2019-12-25 (×2): 80 mg via ORAL
  Filled 2019-12-23 (×2): qty 1

## 2019-12-23 MED ORDER — LEVOTHYROXINE SODIUM 75 MCG PO TABS
75.0000 ug | ORAL_TABLET | Freq: Every day | ORAL | Status: DC
  Administered 2019-12-24 – 2019-12-25 (×2): 75 ug via ORAL
  Filled 2019-12-23 (×2): qty 1

## 2019-12-23 MED ORDER — SODIUM CHLORIDE 0.9% FLUSH: 3.0000 mL | INTRAVENOUS | Status: DC | PRN

## 2019-12-23 MED ORDER — NOREPINEPHRINE 4 MG/250ML-% IV SOLN
INTRAVENOUS | Status: DC | PRN
  Administered 2019-12-23: 2 ug/min via INTRAVENOUS

## 2019-12-23 MED ORDER — SODIUM CHLORIDE 0.9 % IV SOLN
1.5000 g | INTRAVENOUS | Status: AC
  Administered 2019-12-23: 1.5 g via INTRAVENOUS
  Filled 2019-12-23: qty 1.5

## 2019-12-23 MED ORDER — POTASSIUM CHLORIDE CRYS ER 10 MEQ PO TBCR
20.0000 meq | EXTENDED_RELEASE_TABLET | Freq: Every day | ORAL | Status: DC
  Administered 2019-12-24 – 2019-12-25 (×2): 20 meq via ORAL
  Filled 2019-12-23 (×4): qty 2

## 2019-12-23 MED ORDER — FINASTERIDE 5 MG PO TABS
5.0000 mg | ORAL_TABLET | Freq: Every day | ORAL | Status: DC
  Administered 2019-12-24 – 2019-12-25 (×2): 5 mg via ORAL
  Filled 2019-12-23 (×2): qty 1

## 2019-12-23 MED ORDER — LIDOCAINE HCL (PF) 1 % IJ SOLN
INTRAMUSCULAR | Status: DC | PRN
  Administered 2019-12-23: 8 mL

## 2019-12-23 MED ORDER — BUSPIRONE HCL 15 MG PO TABS
7.5000 mg | ORAL_TABLET | Freq: Two times a day (BID) | ORAL | Status: DC
  Administered 2019-12-23 – 2019-12-25 (×4): 7.5 mg via ORAL
  Filled 2019-12-23 (×4): qty 1

## 2019-12-23 MED ORDER — EPHEDRINE SULFATE-NACL 50-0.9 MG/10ML-% IV SOSY
PREFILLED_SYRINGE | INTRAVENOUS | Status: DC | PRN
  Administered 2019-12-23 (×3): 5 mg via INTRAVENOUS
  Administered 2019-12-23: 15 mg via INTRAVENOUS
  Administered 2019-12-23 (×4): 5 mg via INTRAVENOUS

## 2019-12-23 MED ORDER — HEPARIN (PORCINE) IN NACL 1000-0.9 UT/500ML-% IV SOLN
INTRAVENOUS | Status: DC | PRN
  Administered 2019-12-23: 500 mL

## 2019-12-23 MED ORDER — AMIODARONE HCL 200 MG PO TABS
200.0000 mg | ORAL_TABLET | Freq: Every day | ORAL | Status: DC
  Administered 2019-12-23 – 2019-12-25 (×3): 200 mg via ORAL
  Filled 2019-12-23 (×3): qty 1

## 2019-12-23 MED ORDER — SODIUM CHLORIDE 0.9% FLUSH
3.0000 mL | Freq: Two times a day (BID) | INTRAVENOUS | Status: DC
  Administered 2019-12-25: 3 mL via INTRAVENOUS

## 2019-12-23 MED ORDER — APIXABAN 5 MG PO TABS
5.0000 mg | ORAL_TABLET | Freq: Two times a day (BID) | ORAL | Status: DC
  Administered 2019-12-24 – 2019-12-25 (×2): 5 mg via ORAL
  Filled 2019-12-23 (×3): qty 1

## 2019-12-23 MED ORDER — CHLORHEXIDINE GLUCONATE 0.12 % MT SOLN: 15.0000 mL | Freq: Once | OROMUCOSAL | Status: AC

## 2019-12-23 MED ORDER — CARVEDILOL 6.25 MG PO TABS
6.2500 mg | ORAL_TABLET | Freq: Two times a day (BID) | ORAL | Status: DC
  Administered 2019-12-24 – 2019-12-25 (×3): 6.25 mg via ORAL
  Filled 2019-12-23 (×3): qty 1

## 2019-12-23 MED ORDER — EZETIMIBE 10 MG PO TABS
10.0000 mg | ORAL_TABLET | Freq: Every day | ORAL | Status: DC
  Administered 2019-12-24 – 2019-12-25 (×2): 10 mg via ORAL
  Filled 2019-12-23 (×2): qty 1

## 2019-12-23 MED ORDER — HEPARIN (PORCINE) IN NACL 2000-0.9 UNIT/L-% IV SOLN
INTRAVENOUS | Status: DC | PRN
  Administered 2019-12-23 (×3): 1000 mL

## 2019-12-23 MED ORDER — TAMSULOSIN HCL 0.4 MG PO CAPS
0.4000 mg | ORAL_CAPSULE | Freq: Every day | ORAL | Status: DC
  Administered 2019-12-24 – 2019-12-25 (×2): 0.4 mg via ORAL
  Filled 2019-12-23 (×2): qty 1

## 2019-12-23 MED ORDER — CHLORHEXIDINE GLUCONATE 4 % EX LIQD: 60.0000 mL | Freq: Once | CUTANEOUS | Status: DC

## 2019-12-23 MED ORDER — LIOTHYRONINE SODIUM 5 MCG PO TABS
10.0000 ug | ORAL_TABLET | Freq: Every day | ORAL | Status: DC
  Administered 2019-12-24 – 2019-12-25 (×2): 10 ug via ORAL
  Filled 2019-12-23 (×2): qty 2

## 2019-12-23 MED ORDER — CHLORHEXIDINE GLUCONATE CLOTH 2 % EX PADS
6.0000 | MEDICATED_PAD | Freq: Every day | CUTANEOUS | Status: DC
  Administered 2019-12-24 – 2019-12-25 (×2): 6 via TOPICAL

## 2019-12-23 MED ORDER — ROCURONIUM BROMIDE 10 MG/ML (PF) SYRINGE
PREFILLED_SYRINGE | INTRAVENOUS | Status: DC | PRN
  Administered 2019-12-23: 100 mg via INTRAVENOUS
  Administered 2019-12-23: 20 mg via INTRAVENOUS

## 2019-12-23 MED ORDER — VANCOMYCIN HCL 1500 MG/300ML IV SOLN
1500.0000 mg | INTRAVENOUS | Status: AC
  Administered 2019-12-23: 1500 mg via INTRAVENOUS
  Filled 2019-12-23: qty 300

## 2019-12-23 MED ORDER — ACETAMINOPHEN 325 MG PO TABS
650.0000 mg | ORAL_TABLET | ORAL | Status: DC | PRN
  Administered 2019-12-23 – 2019-12-24 (×3): 650 mg via ORAL
  Filled 2019-12-23 (×3): qty 2

## 2019-12-23 MED ORDER — PROTAMINE SULFATE 10 MG/ML IV SOLN
INTRAVENOUS | Status: DC | PRN
  Administered 2019-12-23: 50 mg via INTRAVENOUS

## 2019-12-23 MED ORDER — FUROSEMIDE 40 MG PO TABS
40.0000 mg | ORAL_TABLET | Freq: Every day | ORAL | Status: DC
  Administered 2019-12-24 – 2019-12-25 (×2): 40 mg via ORAL
  Filled 2019-12-23 (×2): qty 1

## 2019-12-23 MED ORDER — FENTANYL CITRATE (PF) 250 MCG/5ML IJ SOLN
INTRAMUSCULAR | Status: DC | PRN
  Administered 2019-12-23: 100 ug via INTRAVENOUS

## 2019-12-23 MED ORDER — CLOPIDOGREL BISULFATE 75 MG PO TABS
75.0000 mg | ORAL_TABLET | Freq: Every day | ORAL | Status: DC
  Administered 2019-12-24 – 2019-12-25 (×2): 75 mg via ORAL
  Filled 2019-12-23 (×2): qty 1

## 2019-12-23 MED ORDER — PROPOFOL 10 MG/ML IV BOLUS
INTRAVENOUS | Status: DC | PRN
  Administered 2019-12-23: 130 mg via INTRAVENOUS

## 2019-12-23 MED ORDER — DEXAMETHASONE SODIUM PHOSPHATE 10 MG/ML IJ SOLN
INTRAMUSCULAR | Status: DC | PRN
  Administered 2019-12-23: 10 mg via INTRAVENOUS

## 2019-12-23 MED ORDER — ONDANSETRON HCL 4 MG/2ML IJ SOLN
INTRAMUSCULAR | Status: DC | PRN
  Administered 2019-12-23: 4 mg via INTRAVENOUS

## 2019-12-23 MED ORDER — HEPARIN SODIUM (PORCINE) 1000 UNIT/ML IJ SOLN
INTRAMUSCULAR | Status: DC | PRN
  Administered 2019-12-23: 15000 [IU] via INTRAVENOUS
  Administered 2019-12-23: 2000 [IU] via INTRAVENOUS

## 2019-12-23 MED ORDER — EPINEPHRINE 1 MG/10ML IJ SOSY
PREFILLED_SYRINGE | INTRAMUSCULAR | Status: DC | PRN
  Administered 2019-12-23: .9 mg via INTRAVENOUS
  Administered 2019-12-23: .1 mg via INTRAVENOUS

## 2019-12-23 MED ORDER — LACTATED RINGERS IV SOLN: INTRAVENOUS | Status: DC | PRN

## 2019-12-23 MED ORDER — MILRINONE LACTATE IN DEXTROSE 20-5 MG/100ML-% IV SOLN
INTRAVENOUS | Status: DC | PRN
  Administered 2019-12-23: .3 ug/kg/min via INTRAVENOUS

## 2019-12-23 MED ORDER — ONDANSETRON HCL 4 MG/2ML IJ SOLN: 4.0000 mg | Freq: Four times a day (QID) | INTRAMUSCULAR | Status: DC | PRN

## 2019-12-23 MED ORDER — SPIRONOLACTONE 12.5 MG HALF TABLET
12.5000 mg | ORAL_TABLET | Freq: Every day | ORAL | Status: DC
  Administered 2019-12-24 – 2019-12-25 (×2): 12.5 mg via ORAL
  Filled 2019-12-23 (×2): qty 1

## 2019-12-23 MED ORDER — CHLORHEXIDINE GLUCONATE 0.12 % MT SOLN
OROMUCOSAL | Status: AC
  Administered 2019-12-23: 15 mL via OROMUCOSAL
  Filled 2019-12-23: qty 15

## 2019-12-23 MED ORDER — SUGAMMADEX SODIUM 200 MG/2ML IV SOLN
INTRAVENOUS | Status: DC | PRN
  Administered 2019-12-23: 250 mg via INTRAVENOUS

## 2019-12-23 MED ORDER — CHLORHEXIDINE GLUCONATE 4 % EX LIQD: 30.0000 mL | CUTANEOUS | Status: DC

## 2019-12-23 SURGICAL SUPPLY — 20 items
BLANKET WARM UNDERBOD FULL ACC (MISCELLANEOUS) ×2 IMPLANT
CABLE ADAPT PACING TEMP 12FT (ADAPTER) ×2 IMPLANT
CATH MITRA STEERABLE GUIDE (CATHETERS) ×2 IMPLANT
CATH S G BIP PACING (CATHETERS) ×2 IMPLANT
CLIP MITRA G4 DELIVERY SYS XTW (Clip) ×2 IMPLANT
CLOSURE PERCLOSE PROSTYLE (VASCULAR PRODUCTS) ×4 IMPLANT
KIT DILATOR VASC 18G NDL (KITS) ×2 IMPLANT
KIT HEART LEFT (KITS) ×4 IMPLANT
KIT VERSACROSS FXD (WIRE) ×2 IMPLANT
PACK CARDIAC CATHETERIZATION (CUSTOM PROCEDURE TRAY) ×2 IMPLANT
PINNACLE LONG 6F 25CM (SHEATH) ×2
SHEATH INTRO PINNACLE 6F 25CM (SHEATH) ×1 IMPLANT
SHEATH PINNACLE 8F 10CM (SHEATH) ×2 IMPLANT
SHEATH PROBE COVER 6X72 (BAG) ×4 IMPLANT
SHIELD RADPAD SCOOP 12X17 (MISCELLANEOUS) ×2 IMPLANT
STOPCOCK MORSE 400PSI 3WAY (MISCELLANEOUS) ×12 IMPLANT
SYSTEM MITRACLIP G4 (SYSTAGENIX WOUND MANAGEMENT) ×2 IMPLANT
TRANSDUCER W/STOPCOCK (MISCELLANEOUS) ×2 IMPLANT
TUBING ART PRESS 72  MALE/FEM (TUBING) ×1
TUBING ART PRESS 72 MALE/FEM (TUBING) ×1 IMPLANT

## 2019-12-23 NOTE — Plan of Care (Signed)
  Problem: Education: Goal: Knowledge of General Education information will improve Description: Including pain rating scale, medication(s)/side effects and non-pharmacologic comfort measures Outcome: Progressing   Problem: Health Behavior/Discharge Planning: Goal: Ability to manage health-related needs will improve Outcome: Progressing   Problem: Clinical Measurements: Goal: Ability to maintain clinical measurements within normal limits will improve Outcome: Progressing   Problem: Clinical Measurements: Goal: Cardiovascular complication will be avoided Outcome: Progressing   Problem: Nutrition: Goal: Adequate nutrition will be maintained Outcome: Progressing   Problem: Coping: Goal: Level of anxiety will decrease Outcome: Progressing   Problem: Elimination: Goal: Will not experience complications related to urinary retention Outcome: Progressing   Problem: Pain Managment: Goal: General experience of comfort will improve Outcome: Progressing   Problem: Skin Integrity: Goal: Risk for impaired skin integrity will decrease Outcome: Progressing

## 2019-12-23 NOTE — Progress Notes (Signed)
Note Hematuria upon arrival to recovery.  Dr Burt Knack aware.

## 2019-12-23 NOTE — Interval H&P Note (Signed)
History and Physical Interval Note:  12/23/2019 7:38 AM  Raymond Christensen  has presented today for surgery, with the diagnosis of Severe Mitral Valve Insufficiency.  The various methods of treatment have been discussed with the patient and family. After consideration of risks, benefits and other options for treatment, the patient has consented to  Procedure(s): MITRAL VALVE REPAIR (N/A) TRANSESOPHAGEAL ECHOCARDIOGRAM (TEE) (N/A) as a surgical intervention.  The patient's history has been reviewed, patient examined, no change in status, stable for surgery.  I have reviewed the patient's chart and labs.  Questions were answered to the patient's satisfaction.    The patient is evaluated in the Short Stay area, labs reviewed and preop imaging studies reviewed, and he reports no change in symptoms from his initial evaluation above. He continues to report dyspnea with moderate activity consistent with NYHA functional class 2 symptoms. All questions are answered. He is ready to proceed with transcatheter mitral valve edge to edge repair and full informed consent is obtained.   [name]

## 2019-12-23 NOTE — Progress Notes (Signed)
Echocardiogram Echocardiogram Transesophageal has been performed as part of the MitraClip Procedure.  [place] Deputy Ra Pfiester 12/23/2019, 10:24 AM

## 2019-12-23 NOTE — Anesthesia Postprocedure Evaluation (Signed)
Anesthesia Post Note  Patient: Raymond Christensen  Procedure(s) Performed: MITRAL VALVE REPAIR (N/A ) TRANSESOPHAGEAL ECHOCARDIOGRAM (TEE) (N/A )     Patient location during evaluation: PACU Anesthesia Type: General Level of consciousness: awake and alert Pain management: pain level controlled Vital Signs Assessment: post-procedure vital signs reviewed and stable Respiratory status: spontaneous breathing, nonlabored ventilation, respiratory function stable and patient connected to nasal cannula oxygen Cardiovascular status: blood pressure returned to baseline and stable Postop Assessment: no apparent nausea or vomiting Anesthetic complications: no Comments: Raymond Christensen had brief cardiac episode requiring epi and brief period of compressions before ROSC. After cardiac episode, no additional events during the procedure. Procedure completed and Raymond Christensen extubated without sequelae. Raymond Christensen evaluated in recovery, neuro intact.    No complications documented.  Last Vitals:  Vitals:   12/23/19 1600 12/23/19 1700  BP: (!) 103/56 106/66  Pulse: 69 71  Resp:    Temp:  36.4 C  SpO2: (!) 89% 93%    Last Pain:  Vitals:   12/23/19 1700  TempSrc: Oral  PainSc:                  Effie Berkshire

## 2019-12-23 NOTE — Anesthesia Procedure Notes (Signed)
Arterial Line Insertion Start/End12/09/2019 7:10 AM, 12/23/2019 7:15 AM Performed by: Thelma Comp, CRNA, CRNA  Patient location: Pre-op. Preanesthetic checklist: patient identified, IV checked, site marked, risks and benefits discussed, surgical consent, monitors and equipment checked, pre-op evaluation, timeout performed and anesthesia consent Lidocaine 1% used for infiltration Right, radial was placed Catheter size: 20 Fr Hand hygiene performed , maximum sterile barriers used  and Seldinger technique used Allen's test indicative of satisfactory collateral circulation Attempts: 1 Procedure performed without using ultrasound guided technique. Following insertion, dressing applied and Biopatch. Post procedure assessment: normal and unchanged  Patient tolerated the procedure well with no immediate complications.

## 2019-12-23 NOTE — Transfer of Care (Signed)
Immediate Anesthesia Transfer of Care Note  Patient: Raymond Christensen  Procedure(s) Performed: MITRAL VALVE REPAIR (N/A ) TRANSESOPHAGEAL ECHOCARDIOGRAM (TEE) (N/A )  Patient Location: Cath Lab  Anesthesia Type:General  Level of Consciousness: awake, alert  and patient cooperative  Airway & Oxygen Therapy: Patient Spontanous Breathing and Patient connected to face mask oxygen  Post-op Assessment: Report given to RN, Post -op Vital signs reviewed and stable, Patient moving all extremities, Patient moving all extremities X 4 and Patient able to stick tongue midline  Post vital signs: Reviewed and stable  Last Vitals:  Vitals Value Taken Time  BP 101/53 12/23/19 1046  Temp    Pulse 115 12/23/19 1046  Resp 0 12/23/19 1046  SpO2 99 % 12/23/19 1046  Vitals shown include unvalidated device data.  Last Pain:  Vitals:   12/23/19 0625  TempSrc: Oral  PainSc:          Complications: No complications documented.

## 2019-12-23 NOTE — Progress Notes (Signed)
Pt came to unit post procedure . Alert and oriented , pleasant , with c/o pain to penile area from urinary catheter , states its a burning pain, bloody drainage noted from catheter , with leakage from penile area. Gauze placed for cleanliness, will monitor output,v/s are stable. wife is with pt.

## 2019-12-23 NOTE — Anesthesia Preprocedure Evaluation (Addendum)
Anesthesia Evaluation  Patient identified by MRN, date of birth, ID band Patient awake    Reviewed: Allergy & Precautions, NPO status , Patient's Chart, lab work & pertinent test results  Airway Mallampati: I  TM Distance: <3 FB Neck ROM: Full    Dental  (+) Teeth Intact, Dental Advisory Given   Pulmonary sleep apnea and Continuous Positive Airway Pressure Ventilation , former smoker,    breath sounds clear to auscultation       Cardiovascular hypertension, + CAD, + Past MI, + CABG and +CHF  + dysrhythmias Atrial Fibrillation  Rhythm:Regular Rate:Normal + Systolic murmurs    Neuro/Psych PSYCHIATRIC DISORDERS Depression negative neurological ROS     GI/Hepatic negative GI ROS, Neg liver ROS,   Endo/Other  diabetes, Type 2Hypothyroidism   Renal/GU      Musculoskeletal  (+) Arthritis ,   Abdominal Normal abdominal exam  (+)   Peds  Hematology   Anesthesia Other Findings - HLD  Reproductive/Obstetrics                            Anesthesia Physical Anesthesia Plan  ASA: IV  Anesthesia Plan: General   Post-op Pain Management:    Induction: Intravenous  PONV Risk Score and Plan: 3 and Ondansetron, Dexamethasone and Treatment may vary due to age or medical condition  Airway Management Planned: Oral ETT  Additional Equipment: Arterial line  Intra-op Plan:   Post-operative Plan: Extubation in OR  Informed Consent: I have reviewed the patients History and Physical, chart, labs and discussed the procedure including the risks, benefits and alternatives for the proposed anesthesia with the patient or authorized representative who has indicated his/her understanding and acceptance.     Dental advisory given  Plan Discussed with: CRNA  Anesthesia Plan Comments: (Lab Results      Component                Value               Date                      WBC                      7.6                  12/21/2019                HGB                      14.2                12/21/2019                HCT                      44.7                12/21/2019                MCV                      89.9                12/21/2019                PLT  125 (L)             12/21/2019             Echo: 1. Left ventricular ejection fraction, by estimation, is 40 to 45%. The  left ventricle has mildly decreased function. Left ventricular endocardial  border not optimally defined to evaluate regional wall motion.  2. Right ventricular systolic function is normal. The right ventricular  size is normal.  3. A left atrial/left atrial appendage thrombus was detected.  4. Moderate to severe regurgitation; severe by ERO and MR radius,  moderate by Rvol. Unable to pulse PV as he was not tolerating procedure  well at the time and expedited images were obtained. There appears to be a  ruptured chord and partial prolapsed  leaflet of A2. Taken together, highly suggestive of severe eccentric MR..  The mitral valve is degenerative. Moderate to severe mitral valve  regurgitation.  5. The aortic valve is tricuspid. Aortic valve regurgitation is mild. No  aortic stenosis is present.  6. There is Moderate (Grade III) plaque involving the descending aorta. )      Anesthesia Quick Evaluation

## 2019-12-23 NOTE — Anesthesia Procedure Notes (Signed)
Procedure Name: Intubation Date/Time: 12/23/2019 7:53 AM Performed by: Thelma Comp, CRNA Pre-anesthesia Checklist: Patient identified, Emergency Drugs available, Suction available and Patient being monitored Patient Re-evaluated:Patient Re-evaluated prior to induction Oxygen Delivery Method: Circle System Utilized Preoxygenation: Pre-oxygenation with 100% oxygen Induction Type: IV induction Ventilation: Mask ventilation without difficulty Laryngoscope Size: Mac and 4 Grade View: Grade II Tube type: Oral Tube size: 7.5 mm Number of attempts: 1 Airway Equipment and Method: Stylet and Oral airway Placement Confirmation: ETT inserted through vocal cords under direct vision,  positive ETCO2 and breath sounds checked- equal and bilateral Secured at: 22 cm Tube secured with: Tape Dental Injury: Teeth and Oropharynx as per pre-operative assessment

## 2019-12-23 NOTE — Discharge Instructions (Signed)
Home Care Following Your MitraClip Procedure      If you have any questions or concerns you can call the structural heart office at [phone] during normal business hours 8am-4pm. If you have an urgent need after hours or on the weekend, please call [phone] to talk to the on call provider for general cardiology. If you have an emergency that requires immediate attention, please call 911.   Groin Site Care Refer to this sheet in the next few weeks. These instructions provide you with information on caring for yourself after your procedure. Your caregiver may also give you more specific instructions. Your treatment has been planned according to current medical practices, but problems sometimes occur. Call your caregiver if you have any problems or questions after your procedure. HOME CARE INSTRUCTIONS  You may shower 24 hours after the procedure. Remove the bandage (dressing) and gently wash the site with plain soap and water. Gently pat the site dry.   Do not apply powder or lotion to the site.   Do not sit in a bathtub, swimming pool, or whirlpool for 5 to 7 days.   No bending, squatting, or lifting anything over 10 pounds (4.5 kg) as directed by your caregiver.   Inspect the site at least twice daily.   Do not drive home if you are discharged the same day of the procedure. Have someone else drive you.   You may drive 72 hours after the procedure unless otherwise instructed by your caregiver.  What to expect:  Any bruising will usually fade within 1 to 2 weeks.   Blood that collects in the tissue (hematoma) may be painful to the touch. It should usually decrease in size and tenderness within 1 to 2 weeks.  SEEK IMMEDIATE MEDICAL CARE IF:  You have unusual pain at the groin site or down the affected leg.   You have redness, warmth, swelling, or pain at the groin site.   You have drainage (other than a small amount of blood on the dressing).   You have chills.   You have a  fever or persistent symptoms for more than 72 hours.   You have a fever and your symptoms suddenly get worse.   Your leg becomes pale, cool, tingly, or numb.   You have bleeding from the site. Hold pressure on the site until it subsides.    After MitraClip Checklist  Check  Test Description   Follow up appointment in 1-2 weeks  Most of our patients will see our structural heart physician assistant, Katie Shereece Wellborn, or your primary cardiologist within 1-2 weeks. Your incision site will be checked and you will be cleared to resume all normal activities if you are doing well.     1 month echo and follow up  You will have an echo to check on your heart valve clip and be seen back in the office by Katie Kierah Goatley PA-C.   Follow up with your primary cardiologist You will need to be seen by your primary cardiologist in the following 3-6 months after your 1 month appointment in the valve clinic. Often times your Plavix or Aspirin will be discontinued during this time, but this is decided on a case by case basis.    1 year echo and follow up You will have another echo to check on your heart valve after one year and be seen back in the office by Katie Lilybelle Mayeda. This your last structural heart visit.   Bacterial endocarditis prophylaxis  You will   have to take antibiotics for the rest of your life before all dental procedures (even dental cleanings) to protect your heart valve from potential infection. Antibiotics are also required before some surgeries. Please check with your cardiologist before scheduling any surgeries. Also, please make sure to tell us if you have a penicillin allergy as you will require an alternative antibiotic.    ______________  Your Implant Identification Card Following your procedure, you will receive an Implant Identification Card, which your doctor will fill out and which you must carry with you at all times. Show your Implant Identification Card if you report to an emergency room.  This card identifies you as a patient who has had a MitraClip device implanted. If you require a magnetic resonance imaging (MRI) scan, tell your doctor or MRI technician that you have a MitraClip device implanted. Test results indicate that patients with the MitraClip device can safely undergo MRI scans under certain conditions described on the card.  

## 2019-12-24 ENCOUNTER — Inpatient Hospital Stay (HOSPITAL_COMMUNITY): Payer: Medicare (Managed Care)

## 2019-12-24 ENCOUNTER — Encounter (HOSPITAL_COMMUNITY): Payer: Self-pay | Admitting: Cardiovascular Disease

## 2019-12-24 ENCOUNTER — Ambulatory Visit: Payer: Medicare (Managed Care) | Admitting: Family Medicine

## 2019-12-24 DIAGNOSIS — Z95818 Presence of other cardiac implants and grafts: Secondary | ICD-10-CM

## 2019-12-24 DIAGNOSIS — I340 Nonrheumatic mitral (valve) insufficiency: Secondary | ICD-10-CM

## 2019-12-24 DIAGNOSIS — Z9889 Other specified postprocedural states: Secondary | ICD-10-CM

## 2019-12-24 DIAGNOSIS — I361 Nonrheumatic tricuspid (valve) insufficiency: Secondary | ICD-10-CM

## 2019-12-24 DIAGNOSIS — Z954 Presence of other heart-valve replacement: Secondary | ICD-10-CM

## 2019-12-24 LAB — BASIC METABOLIC PANEL
Anion gap: 9 (ref 5–15)
BUN: 20 mg/dL (ref 8–23)
CO2: 23 mmol/L (ref 22–32)
Calcium: 8.7 mg/dL — ABNORMAL LOW (ref 8.9–10.3)
Chloride: 103 mmol/L (ref 98–111)
Creatinine, Ser: 1.28 mg/dL — ABNORMAL HIGH (ref 0.61–1.24)
GFR, Estimated: 57 mL/min — ABNORMAL LOW (ref >60)
Glucose, Bld: 221 mg/dL — ABNORMAL HIGH (ref 70–99)
Potassium: 4 mmol/L (ref 3.5–5.1)
Sodium: 135 mmol/L (ref 135–145)

## 2019-12-24 LAB — CBC
HCT: 39.7 % (ref 39.0–52.0)
Hemoglobin: 12.6 g/dL — ABNORMAL LOW (ref 13.0–17.0)
MCH: 28.1 pg (ref 26.0–34.0)
MCHC: 31.7 g/dL (ref 30.0–36.0)
MCV: 88.6 fL (ref 80.0–100.0)
Platelets: 138 10*3/uL — ABNORMAL LOW (ref 150–400)
RBC: 4.48 MIL/uL (ref 4.22–5.81)
RDW: 14.1 % (ref 11.5–15.5)
WBC: 12.2 10*3/uL — ABNORMAL HIGH (ref 4.0–10.5)
nRBC: 0 % (ref 0.0–0.2)

## 2019-12-24 LAB — URINALYSIS, ROUTINE W REFLEX MICROSCOPIC
Bilirubin Urine: NEGATIVE
Glucose, UA: NEGATIVE mg/dL
Ketones, ur: NEGATIVE mg/dL
Nitrite: NEGATIVE
Protein, ur: NEGATIVE mg/dL
Specific Gravity, Urine: 1.005 (ref 1.005–1.030)
pH: 5 (ref 5.0–8.0)

## 2019-12-24 NOTE — Progress Notes (Signed)
CARDIAC REHAB PHASE I   PRE:  Rate/Rhythm: 65 afib    BP: sitting 110/57    SaO2: 95 RA  MODE:  Ambulation: 680 ft   POST:  Rate/Rhythm: 120 afib    BP: sitting 135/61     SaO2: 95 RA  Pt weak from bedrest. Used RW until his legs felt stronger. Slow and steady, HR up to 120 afib. Last 170 ft he did not need RW. To recliner. Discussed walking at home, restrictions, and CRPII which pt would like to do again. Will refer to [place], ACSM 12/24/2019 10:04 AM

## 2019-12-24 NOTE — Plan of Care (Signed)
  Problem: Education: Goal: Knowledge of General Education information will improve Description: Including pain rating scale, medication(s)/side effects and non-pharmacologic comfort measures Outcome: Progressing   Problem: Health Behavior/Discharge Planning: Goal: Ability to manage health-related needs will improve Outcome: Progressing   Problem: Clinical Measurements: Goal: Ability to maintain clinical measurements within normal limits will improve Outcome: Progressing   Problem: Activity: Goal: Risk for activity intolerance will decrease Outcome: Progressing   Problem: Nutrition: Goal: Adequate nutrition will be maintained Outcome: Progressing   Problem: Pain Managment: Goal: General experience of comfort will improve Outcome: Progressing   Problem: Skin Integrity: Goal: Risk for impaired skin integrity will decrease Outcome: Progressing   

## 2019-12-24 NOTE — Progress Notes (Signed)
  Echocardiogram 2D Echocardiogram has been performed.  Jennette Dubin 12/24/2019, 9:24 AM

## 2019-12-24 NOTE — Progress Notes (Addendum)
[place] VALVE TEAM  Patient Name: Raymond Christensen Date of Encounter: 12/24/2019  Primary Cardiologist: Dr. Laurena Bering Problem List     Principal Problem:   S/P mitral valve clip implantation Active Problems:   Type 2 diabetes mellitus without complication, without long-term current use of insulin ([place])   Morbid obesity ([place])   Essential hypertension   Hypothyroidism   Major depressive disorder   Obstructive sleep apnea on CPAP   Diabetes mellitus type 2 in obese (H[facility])   Thrombocytopenia ([facility])   CAD (coronary artery disease) of artery bypass graft   Persistent atrial fibrillation ([facility])   Chronic systolic heart failure ([facility])   Nonrheumatic mitral valve regurgitation   Mitral valve disease     Subjective   Feeling well. Complains of dysuria. No chest pain or SOB. hasn't walked yet.  Inpatient Medications    Scheduled Meds: . amiodarone  200 mg Oral Daily  . apixaban  5 mg Oral BID  . atorvastatin  80 mg Oral Daily  . busPIRone  7.5 mg Oral BID  . carvedilol  6.25 mg Oral BID WC  . Chlorhexidine Gluconate Cloth  6 each Topical Daily  . clopidogrel  75 mg Oral Q breakfast  . ezetimibe  10 mg Oral Daily  . famotidine  20 mg Oral Daily  . finasteride  5 mg Oral Daily  . furosemide  40 mg Oral Daily  . levothyroxine  75 mcg Oral QAC breakfast  . liothyronine  10 mcg Oral Daily  . potassium chloride  20 mEq Oral Daily  . sacubitril-valsartan  1 tablet Oral BID  . sodium chloride flush  3 mL Intravenous Q12H  . spironolactone  12.5 mg Oral Daily  . tamsulosin  0.4 mg Oral Daily   Continuous Infusions: . sodium chloride     PRN Meds: sodium chloride, acetaminophen, ondansetron (ZOFRAN) IV, sodium chloride flush   Vital Signs    Vitals:   12/23/19 1942 12/23/19 2344 12/24/19 0348 12/24/19 0744  BP: 120/62 (!) 109/58 (!) 112/55 (!) 126/56  Pulse: 61 60 (!) 57 (!) 59  Resp: 18 20 16 20   Temp: 97.7 F (36.5 C) 98  F (36.7 C) 97.6 F (36.4 C) 97.9 F (36.6 C)  TempSrc: Oral Oral Oral Oral  SpO2: 93% 95% 94% 96%  Weight:      Height:        Intake/Output Summary (Last 24 hours) at 12/24/2019 1059 Last data filed at 12/24/2019 1000 Gross per 24 hour  Intake 960 ml  Output 1630 ml  Net -670 ml   Filed Weights   12/23/19 0625  Weight: 109.5 kg    Physical Exam    GEN: Well nourished, well developed, in no acute distress.  HEENT: Grossly normal.  Neck: Supple, no JVD, carotid bruits, or masses. Cardiac: irreg irreg, no murmurs, rubs, or gallops. No clubbing, cyanosis, edema.   Respiratory:  Respirations regular and unlabored, clear to auscultation bilaterally. GI: Soft, nontender, nondistended, BS + x 4. GU: foley bag filled with frank blood MS: no deformity or atrophy. Skin: warm and dry, no rash.  Groin site clear without hematoma or ecchymosis Neuro:  Strength and sensation are intact. Psych: AAOx3.  Normal affect.  Labs    CBC Recent Labs    12/21/19 1400 12/24/19 0005  WBC 7.6 12.2*  HGB 14.2 12.6*  HCT 44.7 39.7  MCV 89.9 88.6  PLT 125* 956*   Basic Metabolic Panel Recent  Labs    12/21/19 1400 12/24/19 0005  NA 135 135  K 4.4 4.0  CL 103 103  CO2 22 23  GLUCOSE 104* 221*  BUN 21 20  CREATININE 1.23 1.28*  CALCIUM 9.1 8.7*   Liver Function Tests Recent Labs    12/21/19 1400  AST 34  ALT 43  ALKPHOS 51  BILITOT 0.5  PROT 6.6  ALBUMIN 3.8   No results for input(s): LIPASE, AMYLASE in the last 72 hours. Cardiac Enzymes No results for input(s): CKTOTAL, CKMB, CKMBINDEX, TROPONINI in the last 72 hours. BNP Invalid input(s): POCBNP D-Dimer No results for input(s): DDIMER in the last 72 hours. Hemoglobin A1C Recent Labs    12/21/19 1342  HGBA1C 6.7*   Fasting Lipid Panel No results for input(s): CHOL, HDL, LDLCALC, TRIG, CHOLHDL, LDLDIRECT in the last 72 hours. Thyroid Function Tests No results for input(s): TSH, T4TOTAL, T3FREE, THYROIDAB in  the last 72 hours.  Invalid input(s): FREET3  Telemetry    Afib/fltter with CVR - Personally Reviewed  ECG    Aflutter with variable block - Personally Reviewed  Radiology    CARDIAC CATHETERIZATION  Result Date: 12/23/2019 Successful transcatheter edge-to-edge mitral valve repair using a MitraClip XTW device, positioned A2/P2, reducing mitral regurgitation from 4+ to 1+. Procedure complicated by transient bradycardic arrest likely precipitated by coronary air embolism, with prompt resuscitation and return to baseline hemodynamics/cardiac function Recommendations: Continue clopidogrel, resume apixaban tomorrow morning, assess recovery in the post cardiac catheterization recovery area to determine bed placement in either a cardiac progressive care bed versus ICU  ECHO TEE  Result Date: 12/23/2019    TRANSESOPHOGEAL ECHO REPORT   Patient Name:   Raymond Christensen Date of Exam: 12/23/2019 Medical Rec #:  [id number]      Height:       68.0 in Accession #:    [id number]     Weight:       241.4 lb Date of Birth:  1940-08-21      BSA:          2.214 m Patient Age:    79 years       BP:           122/65 mmHg Patient Gender: M              HR:           77 bpm. Exam Location:  Inpatient Procedure: 2D Echo, 3D Echo, Color Doppler and Cardiac Doppler Indications:     Mitral Regurgitation  History:         Patient has prior history of Echocardiogram examinations, most                  recent 09/13/2019. CHF, Prior CABG, Arrythmias:Atrial                  Fibrillation; Risk Factors:Hypertension, Diabetes, Dyslipidemia                  and Sleep Apnea.  Sonographer:     Raquel Sarna Senior RDCS Referring Phys:  [place] Diagnosing Phys: Ena Dawley MD  Sonographer Comments: MitraClip Procedure with placement of 1 clip PROCEDURE: The transesophogeal probe was passed without difficulty through the esophogus of the patient. Sedation performed by different physician. The patient was monitored while under deep  sedation. The patient's vital signs; including heart rate, blood pressure, and oxygen saturation; remained stable throughout the procedure. The patient developed no complications during the procedure. IMPRESSIONS  1.  Native valve: with primary mitral regurgutation, A2 flail with posteriorly directed eccentric jet wrapping around the left atrium, flail gap 3 mm, PISA radius: 1.2 cm, ERO 0.7 cm2, Reg Vol 101 ml consistent with severe mitral regurgitation. MVA 8 cm2. A XTW MitraClip was successfully placed in the A2-P2 position with improvement of mitral regurgitation to mild. Mean transmitral gradient increased from 2 to 5 mmHg. Mitral regurgitation improved from severe to mild. LVEF remained the same post procedu 40-45%. Mild RV systolic dysfunction. There wa trivial pericardial effusion prior and post procedure. Post procedure there was a small iatrogenic ASD with left to right flow only.  2. Left ventricular ejection fraction, by estimation, is 40 to 45%. The left ventricle has mildly decreased function. The left ventricle demonstrates global hypokinesis. Left ventricular diastolic function could not be evaluated.  3. Right ventricular systolic function is mildly reduced. The right ventricular size is mildly enlarged.  4. Left atrial size was severely dilated. No left atrial/left atrial appendage thrombus was detected.  5. Right atrial size was moderately dilated.  6. The mitral valve is normal in structure. Severe mitral valve regurgitation. No evidence of mitral stenosis. There is severe holosystolic prolapse of the middle segment of the anterior leaflet of the mitral valve.  7. Tricuspid valve regurgitation is mild to moderate.  8. The aortic valve is tricuspid. There is mild calcification of the aortic valve. There is moderate thickening of the aortic valve. Aortic valve regurgitation is mild. No aortic stenosis is present.  9. The inferior vena cava is normal in size with greater than 50% respiratory  variability, suggesting right atrial pressure of 3 mmHg. Conclusion(s)/Recommendation(s): Normal biventricular function without evidence of hemodynamically significant valvular heart disease. FINDINGS  Left Ventricle: Left ventricular ejection fraction, by estimation, is 40 to 45%. The left ventricle has mildly decreased function. The left ventricle demonstrates global hypokinesis. The left ventricular internal cavity size was normal in size. There is  no left ventricular hypertrophy. Left ventricular diastolic function could not be evaluated. Right Ventricle: The right ventricular size is mildly enlarged. No increase in right ventricular wall thickness. Right ventricular systolic function is mildly reduced. Left Atrium: Left atrial size was severely dilated. No left atrial/left atrial appendage thrombus was detected. Right Atrium: Right atrial size was moderately dilated. Pericardium: There is no evidence of pericardial effusion. Mitral Valve: The mitral valve is normal in structure. There is severe holosystolic prolapse of the middle segment of the anterior leaflet of the mitral valve. Severe mitral valve regurgitation, with posteriorly-directed jet. No evidence of mitral valve stenosis. MV peak gradient, 6.7 mmHg. The mean mitral valve gradient is 2.0 mmHg. Tricuspid Valve: The tricuspid valve is normal in structure. Tricuspid valve regurgitation is mild to moderate. No evidence of tricuspid stenosis. Aortic Valve: The aortic valve is tricuspid. There is mild calcification of the aortic valve. There is moderate thickening of the aortic valve. Aortic valve regurgitation is mild. No aortic stenosis is present. Pulmonic Valve: The pulmonic valve was normal in structure. Pulmonic valve regurgitation is not visualized. No evidence of pulmonic stenosis. Aorta: The aortic root is normal in size and structure. Venous: The inferior vena cava is normal in size with greater than 50% respiratory variability, suggesting right  atrial pressure of 3 mmHg. IAS/Shunts: No atrial level shunt detected by color flow Doppler.  MITRAL VALVE MV Peak grad: 6.7 mmHg MV Mean grad: 2.0 mmHg MV Vmax:      1.30 m/s MV Vmean:     74.0 cm/s  MR Peak grad:    64.3 mmHg MR Mean grad:    44.0 mmHg MR Vmax:         401.00 cm/s MR Vmean:        310.0 cm/s MR PISA:         9.05 cm MR PISA Eff ROA: 69 mm MR PISA Radius:  1.20 cm Ena Dawley MD Electronically signed by Ena Dawley MD Signature Date/Time: 12/23/2019/12:00:52 PM    Final (Updated)     Cardiac Studies   12/23/19 MITRAL VALVE REPAIR   Conclusion  Successful transcatheter edge-to-edge mitral valve repair using a MitraClip XTW device, positioned A2/P2, reducing mitral regurgitation from 4+ to 1+.  Procedure complicated by transient bradycardic arrest likely precipitated by coronary air embolism, with prompt resuscitation and return to baseline hemodynamics/cardiac function  Recommendations: Continue clopidogrel, resume apixaban tomorrow morning, assess recovery in the post cardiac catheterization recovery area to determine bed placement in either a cardiac progressive care bed versus ICU  _____________   Echo 12/24/19: pending     Patient Profile     Raymond Christensen is a 79 y.o. male with a history of CAD s/p CABG in 2001 and recent NSTEMI with PCI to SVG--> OM/posterior descending (08/2019), ischemic cardiomyopathy/chronic systolic CHF, HTN, OSA, cerebrovascular disease with previous CEA, persistent atrial fibrillation that has failed cardioversion on Eliquis, DMT2, hypothyroidism and severe symptomatic mitral regurgitation who presented to [facility] on 12/23/19 for planned TEER.    Assessment & Plan    Severe MR: s/p successful transcatheter edge-to-edge mitral valve repair using a MitraClip XTW device, positioned A2/P2, reducing mitral regurgitation from 4+ to 1+. Post op echo pending. Resumed on home Plavix and Eliquis. Plan was for DC home today but pt developed  dysuria and frank hematuria with foley placement and will stay overnight for continued observation and urology consultation.   Bradycardic arrest: procedure complicated by transient bradycardic arrest likely precipitated by coronary air embolism, with prompt resuscitation and return to baseline hemodynamics/cardiac function. Thankfully, he has fully recovered.   CAD s/p CABG and recent PCI in the setting of NSTEMI: will need to continue on Plavix, likely for at least a year with ACS and PCI. No aspirin given concurrent anticoagulation  Persistent afib/flutter: resumed on eliquis. Rate well controlled   New hematuria: ? Trauma from foley in the setting of plavix and Eliquis therapy. Discussed case with Dr. Claudia Desanctis with urology who will see him later tonight. Foley now removed. Pt complaining of dysuria. Will repeat UA (pre op UA clean).   SignedAngelena Form, PA-C  12/24/2019, 10:59 AM  Pager [phone]  Patient seen, examined. Available data reviewed. Agree with findings, assessment, and plan as outlined by Nell Range, PA-C.  The patient is independently interviewed and examined.  On my exam, he is alert, oriented, in no distress.  Heart is irregularly irregular with no murmur gallop, abdomen is soft and nontender, bilateral groin sites are clear, extremities have no edema.  The patient's urine is starting to clear.  He states that he is still passing clots and having burning with urination.  He will undergo urology evaluation this afternoon and we will anticipate discharging him home tomorrow as long as things are still improving.  I personally reviewed his post procedure echo which looks very good with no pericardial effusion and only trivial residual mitral regurgitation.  He remains on apixaban and clopidogrel which I am sure is not helping with his hematuria.  If we need to hold these for a  few days we could do that at small risk of thrombotic complications.  Will await urology evaluation  for final recommendations.  [name], M.D. 12/24/2019 1:20 PM

## 2019-12-24 NOTE — Consult Note (Signed)
I have been asked to see the patient by Dr. [name], for evaluation and management of gross hematuria.  History of present illness: 79 yo man with cardiac hx of severe MR s/p mitral valve repair 12/23/19 also with CAD s/p CABG and recent PCI in setting of NSTEMi with acute onset gross hematuria that occurred after mitral valve procedure yesterday.  This morning, hematuria was improving and urine noted to be light red/pink in tubing.  Foley was removed this morning and patient has been passing clots via urethra.  The plan was for tentative discharge however due to the hematuria with clots he is staying overnight.  He has had some dysuria. He remains on anticoagulation with eliquis and plavix.  Patient is followed by Dr. Junious Silk at [facility] and is on flomax and finasteride for BPH.  Patient states hematuria has cleared up and most recent void was yellow urine.  He denies a history of bladder cancer, significant tobacco use or kidney cancer.  This has never happened before.   Review of systems: A 12 point comprehensive review of systems was obtained and is negative unless otherwise stated in the history of present illness.  Patient Active Problem List   Diagnosis Date Noted  . S/P mitral valve clip implantation 12/23/2019  . Mitral valve disease 12/23/2019  . Nonrheumatic mitral valve regurgitation   . Persistent atrial fibrillation ([place])   . Chronic systolic heart failure ([place])   . Acute idiopathic gout involving toe of left foot   . Thrombocytopenia ([place]) 09/08/2019  . CAD (coronary artery disease) of artery bypass graft 09/08/2019  . Diabetes mellitus type 2 in obese ([place]) 06/22/2019  . Major depressive disorder   . Obstructive sleep apnea on CPAP   . Hypothyroidism 10/15/2017  . Type 2 diabetes mellitus without complication, without long-term current use of insulin ([place]) 02/26/2017  . Morbid obesity ([place]) 02/26/2017  . Essential hypertension 02/26/2017    No current  facility-administered medications on file prior to encounter.   Current Outpatient Medications on File Prior to Encounter  Medication Sig Dispense Refill  . acetaminophen (TYLENOL) 500 MG tablet Take 1,000 mg by mouth every 6 (six) hours as needed for moderate pain or headache.    Marland Kitchen amiodarone (PACERONE) 200 MG tablet Take 2 tablets by mouth daily until 12/09/19, then decrease to one tablet by mouth daily (Patient taking differently: Take 200 mg by mouth daily.) 103 tablet 3  . atorvastatin (LIPITOR) 80 MG tablet Take 1 tablet (80 mg total) by mouth daily. 90 tablet 1  . busPIRone (BUSPAR) 7.5 MG tablet Take 7.5 mg by mouth 2 (two) times daily. (Patient not taking: Reported on 12/17/2019)    . carvedilol (COREG) 6.25 MG tablet Take 1 tablet (6.25 mg total) by mouth 2 (two) times daily with a meal. 180 tablet 3  . Cholecalciferol (VITAMIN D3) 250 MCG (10000 UT) capsule Take 10,000 Units by mouth daily.     . clopidogrel (PLAVIX) 75 MG tablet Take 1 tablet (75 mg total) by mouth daily with breakfast. 30 tablet 3  . colchicine 0.6 MG tablet Take 1 tablet (0.6 mg total) by mouth daily as needed. (Patient taking differently: Take 0.6 mg by mouth daily as needed (gout flare).) 90 tablet 1  . ezetimibe (ZETIA) 10 MG tablet Take 1 tablet (10 mg total) by mouth daily. 90 tablet 1  . famotidine (PEPCID) 20 MG tablet Take 1 tablet (20 mg total) by mouth daily. 30 tablet 3  . finasteride (PROSCAR)  5 MG tablet TAKE 1 TABLET DAILY (Patient taking differently: Take 5 mg by mouth daily.) 90 tablet 1  . furosemide (LASIX) 40 MG tablet Take 1 tablet (40 mg total) by mouth daily. 90 tablet 3  . levocetirizine (XYZAL) 5 MG tablet TAKE 1 TABLET EVERY EVENING (Patient taking differently: Take 5 mg by mouth every evening.) 90 tablet 1  . levothyroxine (SYNTHROID) 75 MCG tablet Take 1 tablet (75 mcg total) by mouth daily. 90 tablet 1  . liothyronine (CYTOMEL) 5 MCG tablet TAKE 2 TABLETS DAILY (Patient taking differently:  Take 10 mcg by mouth daily.) 180 tablet 3  . Multiple Vitamins-Minerals (MULTIVITAMIN ADULT EXTRA C PO) Take 1 tablet by mouth daily.     . nitroGLYCERIN (NITROSTAT) 0.4 MG SL tablet Place 1 tablet (0.4 mg total) under the tongue every 5 (five) minutes as needed for chest pain. 30 tablet 12  . potassium chloride (KLOR-CON 10) 10 MEQ tablet Take 2 tablets (20 mEq total) by mouth daily as needed (with every 40mg  dose of Lasix). (Patient taking differently: Take 20 mEq by mouth daily.) 90 tablet 1  . sacubitril-valsartan (ENTRESTO) 24-26 MG Take 1 tablet by mouth 2 (two) times daily. 180 tablet 3  . spironolactone (ALDACTONE) 25 MG tablet Take 0.5 tablets (12.5 mg total) by mouth daily. 135 tablet 3  . tamsulosin (FLOMAX) 0.4 MG CAPS capsule TAKE 1 CAPSULE DAILY (Patient taking differently: Take 0.4 mg by mouth daily.) 90 capsule 3  . vitamin B-12 (CYANOCOBALAMIN) 1000 MCG tablet Take 1,000 mcg by mouth daily.    Marland Kitchen apixaban (ELIQUIS) 5 MG TABS tablet Take 1 tablet (5 mg total) by mouth 2 (two) times daily. 180 tablet 3    Past Medical History:  Diagnosis Date  . Arthritis   . CAD (coronary artery disease)   . Carotid artery disease ([place])    s/p left CEA 01/17/14  . CHF (congestive heart failure) ([place])   . Concussion Summer 2012  . Dysrhythmia 08/2019   a fib  . Essential hypertension 02/26/2017  . Heart disease   . Hyperlipidemia   . Hypothyroidism 10/15/2017  . Major depressive disorder   . Myocardial infarction ([place]) 1996, 2021  . Obstructive sleep apnea on CPAP   . Poor flexibility of tendon 12/01/2018  . 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 Burt Knack  . Subungual hematoma of digit of hand 12/01/2018  . Subungual hematoma of right foot 12/01/2018  . Thyroid disease   . Type 2 diabetes mellitus without complication, without long-term current use of insulin ([place]) 02/26/2017    Past Surgical History:  Procedure Laterality Date  .  BYPASS GRAFT  2001  . CARDIAC CATHETERIZATION    . CARDIOVERSION N/A 11/08/2019   Procedure: CARDIOVERSION;  Surgeon: [name], MD;  Location: [facility];  Service: Cardiovascular;  Laterality: N/A;  . CAROTID ENDARTERECTOMY Left 01/17/2014  . CORONARY ANGIOPLASTY    . CORONARY ARTERY BYPASS GRAFT    . MITRAL VALVE REPAIR N/A 12/23/2019   Procedure: MITRAL VALVE REPAIR;  Surgeon: [name], MD;  Location: [place];  Service: Cardiovascular;  Laterality: N/A;  . RIGHT/LEFT HEART CATH AND CORONARY/GRAFT ANGIOGRAPHY N/A 09/09/2019   Procedure: RIGHT/LEFT HEART CATH AND CORONARY/GRAFT ANGIOGRAPHY;  Surgeon: [name], MD;  Location: [place] [facility];  Service: Cardiovascular;  Laterality: N/A;  . TEE WITHOUT CARDIOVERSION N/A 09/13/2019   Procedure: TRANSESOPHAGEAL ECHOCARDIOGRAM (TEE);  Surgeon: Harrell Gave,  Shawna Orleans, MD;  Location: [place];  Service: Cardiovascular;  Laterality: N/A;  . TEE WITHOUT CARDIOVERSION N/A 12/23/2019   Procedure: TRANSESOPHAGEAL ECHOCARDIOGRAM (TEE);  Surgeon: [name], MD;  Location: [place] [facility];  Service: Cardiovascular;  Laterality: N/A;  . [place] History   Tobacco Use  . Smoking status: Former Smoker    Types: Cigarettes  . Smokeless tobacco: Never Used  Vaping Use  . Vaping Use: Never used  Substance Use Topics  . Alcohol use: Yes    Alcohol/week: 3.0 standard drinks    Types: 3 Standard drinks or equivalent per week    Comment: 1 drink 3 times per week  . Drug use: No    Family History  Problem Relation Age of Onset  . Dementia Father        Concerns surrounding Lewy body dementia, but never officially diagnosed  . Cancer Neg Hx     PE: Vitals:   12/23/19 2344 12/24/19 0348 12/24/19 0744 12/24/19 1121  BP: (!) 109/58 (!) 112/55 (!) 126/56 103/61  Pulse: 60 (!) 57 (!) 59 63  Resp: 20 16 20 20   Temp: 98 F (36.7 C) 97.6 F (36.4 C) 97.9 F (36.6 C)  97.6 F (36.4 C)  TempSrc: Oral Oral Oral Oral  SpO2: 95% 94% 96% 94%  Weight:      Height:       Patient appears to be in no acute distress  patient is alert and oriented x3 Atraumatic normocephalic head No cervical or supraclavicular lymphadenopathy appreciated No increased work of breathing, no audible wheezes/rhonchi Abdomen is soft, nontender, nondistended, no CVA or suprapubic tenderness Nonpalpable bladder Lower extremities are symmetric without appreciable edema Grossly neurologically intact No identifiable skin lesions  Recent Labs    12/21/19 1400 12/24/19 0005  WBC 7.6 12.2*  HGB 14.2 12.6*  HCT 44.7 39.7   Recent Labs    12/21/19 1400 12/24/19 0005  NA 135 135  K 4.4 4.0  CL 103 103  CO2 22 23  GLUCOSE 104* 221*  BUN 21 20  CREATININE 1.23 1.28*  CALCIUM 9.1 8.7*   Recent Labs    12/21/19 1400  INR 1.0   No results for input(s): LABURIN in the last 72 hours. Results for orders placed or performed during the hospital encounter of 12/21/19  Surgical pcr screen     Status: None   Collection Time: 12/21/19  1:43 PM   Specimen: Nasal Mucosa; Nasal Swab  Result Value Ref Range Status   MRSA, PCR NEGATIVE NEGATIVE Final   Staphylococcus aureus NEGATIVE NEGATIVE Final    Comment: (NOTE) The Xpert SA Assay (FDA approved for NASAL specimens in patients 63 years of age and older), is one component of a comprehensive surveillance program. It is not intended to diagnose infection nor to guide or monitor treatment. Performed at [facility], [place] [address] [place], [place] [address]        Imp: 79 yo man with cardiac history requiring anticoagulation with eliquis and plavix most recently s/p mitral valve repair and GU hx of BPH on flomax and finasteride with acute onset gross hematuria that is improving.   Recommendations: Discussed with patient and his wife possible sources of gross hematuria including prostate/BPH and possible traumatic foley  however cannot rule out bladder malignancy or upper tract source.  Discussed with patient that this would require an office cystoscopy to further evaluate as well as upper tract  imaging.  I will let Dr. Junious Silk know that the patient was in the hospital and will arrange follow-up for discussion of gross hematuria.  As long as patient is voiding spontaneously and urine is clearing no further intervention necessary in the hospital.  Thank you for involving me in this patient's care.  Please page with any further questions or concerns. Vani Gunner D Laverta Harnisch

## 2019-12-25 ENCOUNTER — Encounter (HOSPITAL_COMMUNITY): Payer: Self-pay | Admitting: Cardiovascular Disease

## 2019-12-25 DIAGNOSIS — I340 Nonrheumatic mitral (valve) insufficiency: Principal | ICD-10-CM

## 2019-12-25 LAB — ECHOCARDIOGRAM LIMITED
Area-P 1/2: 3.48 cm2
Height: 68 in
Weight: 3862.39 oz

## 2019-12-25 MED ORDER — AMIODARONE HCL 200 MG PO TABS: 200.0000 mg | ORAL_TABLET | Freq: Every day | ORAL | 3 refills | Status: DC

## 2019-12-25 MED ORDER — AMIODARONE HCL 200 MG PO TABS: 200.0000 mg | ORAL_TABLET | Freq: Every day | ORAL | Status: DC

## 2019-12-25 NOTE — Plan of Care (Signed)
  Problem: Education: Goal: Knowledge of General Education information will improve Description: Including pain rating scale, medication(s)/side effects and non-pharmacologic comfort measures 12/25/2019 1237 by Neil Crouch, RN Outcome: Adequate for Discharge 12/25/2019 1055 by Neil Crouch, RN Outcome: Progressing   Problem: Health Behavior/Discharge Planning: Goal: Ability to manage health-related needs will improve 12/25/2019 1237 by Neil Crouch, RN Outcome: Adequate for Discharge 12/25/2019 1055 by Neil Crouch, RN Outcome: Progressing   Problem: Clinical Measurements: Goal: Ability to maintain clinical measurements within normal limits will improve 12/25/2019 1237 by Neil Crouch, RN Outcome: Adequate for Discharge 12/25/2019 1055 by Neil Crouch, RN Outcome: Progressing Goal: Will remain free from infection 12/25/2019 1237 by Neil Crouch, RN Outcome: Adequate for Discharge 12/25/2019 1055 by Neil Crouch, RN Outcome: Progressing Goal: Diagnostic test results will improve 12/25/2019 1237 by Neil Crouch, RN Outcome: Adequate for Discharge 12/25/2019 1055 by Neil Crouch, RN Outcome: Progressing Goal: Respiratory complications will improve 12/25/2019 1237 by Neil Crouch, RN Outcome: Adequate for Discharge 12/25/2019 1055 by Neil Crouch, RN Outcome: Progressing Goal: Cardiovascular complication will be avoided 12/25/2019 1237 by Neil Crouch, RN Outcome: Adequate for Discharge 12/25/2019 1055 by Neil Crouch, RN Outcome: Progressing

## 2019-12-25 NOTE — Plan of Care (Signed)
  Problem: Education: Goal: Knowledge of General Education information will improve Description: Including pain rating scale, medication(s)/side effects and non-pharmacologic comfort measures 12/25/2019 1407 by Neil Crouch, RN Outcome: Completed/Met 12/25/2019 1237 by Neil Crouch, RN Outcome: Adequate for Discharge 12/25/2019 1055 by Neil Crouch, RN Outcome: Progressing   Problem: Health Behavior/Discharge Planning: Goal: Ability to manage health-related needs will improve 12/25/2019 1407 by Neil Crouch, RN Outcome: Completed/Met 12/25/2019 1237 by Neil Crouch, RN Outcome: Adequate for Discharge 12/25/2019 1055 by Neil Crouch, RN Outcome: Progressing   Problem: Clinical Measurements: Goal: Ability to maintain clinical measurements within normal limits will improve 12/25/2019 1407 by Neil Crouch, RN Outcome: Completed/Met 12/25/2019 1237 by Neil Crouch, RN Outcome: Adequate for Discharge 12/25/2019 1055 by Neil Crouch, RN Outcome: Progressing Goal: Will remain free from infection 12/25/2019 1407 by Neil Crouch, RN Outcome: Completed/Met 12/25/2019 1237 by Neil Crouch, RN Outcome: Adequate for Discharge 12/25/2019 1055 by Neil Crouch, RN Outcome: Progressing Goal: Diagnostic test results will improve 12/25/2019 1407 by Neil Crouch, RN Outcome: Completed/Met 12/25/2019 1237 by Neil Crouch, RN Outcome: Adequate for Discharge 12/25/2019 1055 by Neil Crouch, RN Outcome: Progressing Goal: Respiratory complications will improve 12/25/2019 1407 by Neil Crouch, RN Outcome: Completed/Met 12/25/2019 1237 by Neil Crouch, RN Outcome: Adequate for Discharge 12/25/2019 1055 by Neil Crouch, RN Outcome: Progressing Goal: Cardiovascular complication will be avoided 12/25/2019 1407 by Neil Crouch, RN Outcome: Completed/Met 12/25/2019 1237 by Neil Crouch, RN Outcome: Adequate for Discharge 12/25/2019 1055 by Neil Crouch, RN Outcome: Progressing   Problem:  Nutrition: Goal: Adequate nutrition will be maintained 12/25/2019 1407 by Neil Crouch, RN Outcome: Completed/Met 12/25/2019 1237 by Neil Crouch, RN Outcome: Adequate for Discharge 12/25/2019 1055 by Neil Crouch, RN Outcome: Progressing   Problem: Coping: Goal: Level of anxiety will decrease 12/25/2019 1407 by Neil Crouch, RN Outcome: Completed/Met 12/25/2019 1237 by Neil Crouch, RN Outcome: Adequate for Discharge 12/25/2019 1055 by Neil Crouch, RN Outcome: Progressing   Problem: Elimination: Goal: Will not experience complications related to bowel motility 12/25/2019 1407 by Neil Crouch, RN Outcome: Completed/Met 12/25/2019 1237 by Neil Crouch, RN Outcome: Adequate for Discharge 12/25/2019 1055 by Neil Crouch, RN Outcome: Progressing Goal: Will not experience complications related to urinary retention 12/25/2019 1407 by Neil Crouch, RN Outcome: Completed/Met 12/25/2019 1237 by Neil Crouch, RN Outcome: Adequate for Discharge 12/25/2019 1055 by Neil Crouch, RN Outcome: Progressing   Problem: Pain Managment: Goal: General experience of comfort will improve 12/25/2019 1407 by Neil Crouch, RN Outcome: Completed/Met 12/25/2019 1237 by Neil Crouch, RN Outcome: Adequate for Discharge 12/25/2019 1055 by Neil Crouch, RN Outcome: Progressing   Problem: Safety: Goal: Ability to remain free from injury will improve 12/25/2019 1407 by Neil Crouch, RN Outcome: Completed/Met 12/25/2019 1237 by Neil Crouch, RN Outcome: Adequate for Discharge 12/25/2019 1055 by Neil Crouch, RN Outcome: Progressing   Problem: Skin Integrity: Goal: Risk for impaired skin integrity will decrease 12/25/2019 1407 by Neil Crouch, RN Outcome: Completed/Met 12/25/2019 1237 by Neil Crouch, RN Outcome: Adequate for Discharge 12/25/2019 1055 by Neil Crouch, RN Outcome: Progressing

## 2019-12-25 NOTE — Progress Notes (Signed)
[facility] VALVE TEAM  Patient Name: Raymond Christensen Date of Encounter: 12/25/2019  Primary Cardiologist: Dr. Laurena Bering Problem List     Principal Problem:   S/P mitral valve clip implantation Active Problems:   Type 2 diabetes mellitus without complication, without long-term current use of insulin (HCC)   Morbid obesity ([place])   Essential hypertension   Hypothyroidism   Major depressive disorder   Obstructive sleep apnea on CPAP   Diabetes mellitus type 2 in obese (HCC)   Thrombocytopenia (HCC)   CAD (coronary artery disease) of artery bypass graft   Persistent atrial fibrillation (HCC)   Chronic systolic heart failure (HCC)   Nonrheumatic mitral valve regurgitation   Mitral valve disease   Subjective   Feeling well. Only pink urine, no frank hematuria, no dysuria, no chest pain..  Inpatient Medications    Scheduled Meds: . amiodarone  200 mg Oral Daily  . apixaban  5 mg Oral BID  . atorvastatin  80 mg Oral Daily  . busPIRone  7.5 mg Oral BID  . carvedilol  6.25 mg Oral BID WC  . Chlorhexidine Gluconate Cloth  6 each Topical Daily  . clopidogrel  75 mg Oral Q breakfast  . ezetimibe  10 mg Oral Daily  . famotidine  20 mg Oral Daily  . finasteride  5 mg Oral Daily  . furosemide  40 mg Oral Daily  . levothyroxine  75 mcg Oral QAC breakfast  . liothyronine  10 mcg Oral Daily  . potassium chloride  20 mEq Oral Daily  . sacubitril-valsartan  1 tablet Oral BID  . sodium chloride flush  3 mL Intravenous Q12H  . spironolactone  12.5 mg Oral Daily  . tamsulosin  0.4 mg Oral Daily   Continuous Infusions: . sodium chloride     PRN Meds: sodium chloride, acetaminophen, ondansetron (ZOFRAN) IV, sodium chloride flush   Vital Signs    Vitals:   12/24/19 1938 12/24/19 2327 12/25/19 0307 12/25/19 0723  BP: 123/69 121/69 110/69 115/63  Pulse: 61 61 62 66  Resp: 18 20 16 17   Temp:  97.9 F (36.6 C) 97.6 F (36.4 C) 98 F  (36.7 C)  TempSrc: Oral Oral Oral Oral  SpO2: 95% 98% 98% 91%  Weight:      Height:        Intake/Output Summary (Last 24 hours) at 12/25/2019 1039 Last data filed at 12/24/2019 1700 Gross per 24 hour  Intake 240 ml  Output 1400 ml  Net -1160 ml   Filed Weights   12/23/19 0625  Weight: 109.5 kg    Physical Exam    GEN: Well nourished, well developed, in no acute distress.  HEENT: Grossly normal.  Neck: Supple, no JVD, carotid bruits, or masses. Cardiac: irreg irreg, no murmurs, rubs, or gallops. No clubbing, cyanosis, edema.   Respiratory:  Respirations regular and unlabored, clear to auscultation bilaterally. GI: Soft, nontender, nondistended, BS + x 4. GU: foley bag filled with frank blood MS: no deformity or atrophy. Skin: warm and dry, no rash.  Groin site clear without hematoma or ecchymosis Neuro:  Strength and sensation are intact. Psych: AAOx3.  Normal affect.  Labs    CBC Recent Labs    12/24/19 0005  WBC 12.2*  HGB 12.6*  HCT 39.7  MCV 88.6  PLT 809*   Basic Metabolic Panel Recent Labs    12/24/19 0005  NA 135  K 4.0  CL 103  CO2  23  GLUCOSE 221*  BUN 20  CREATININE 1.28*  CALCIUM 8.7*   Liver Function Tests No results for input(s): AST, ALT, ALKPHOS, BILITOT, PROT, ALBUMIN in the last 72 hours. No results for input(s): LIPASE, AMYLASE in the last 72 hours. Cardiac Enzymes No results for input(s): CKTOTAL, CKMB, CKMBINDEX, TROPONINI in the last 72 hours. BNP Invalid input(s): POCBNP D-Dimer No results for input(s): DDIMER in the last 72 hours. Hemoglobin A1C No results for input(s): HGBA1C in the last 72 hours. Fasting Lipid Panel No results for input(s): CHOL, HDL, LDLCALC, TRIG, CHOLHDL, LDLDIRECT in the last 72 hours. Thyroid Function Tests No results for input(s): TSH, T4TOTAL, T3FREE, THYROIDAB in the last 72 hours.  Invalid input(s): [place] with CVR - Personally Reviewed  ECG    Aflutter with  variable block - Personally Reviewed  Radiology    No results found.  Cardiac Studies   12/23/19 MITRAL VALVE REPAIR   Conclusion  Successful transcatheter edge-to-edge mitral valve repair using a MitraClip XTW device, positioned A2/P2, reducing mitral regurgitation from 4+ to 1+.  Procedure complicated by transient bradycardic arrest likely precipitated by coronary air embolism, with prompt resuscitation and return to baseline hemodynamics/cardiac function  Recommendations: Continue clopidogrel, resume apixaban tomorrow morning, assess recovery in the post cardiac catheterization recovery area to determine bed placement in either a cardiac progressive care bed versus ICU  _____________   Echo 12/24/19: pending     Patient Profile     Raymond Christensen is a 79 y.o. male with a history of CAD s/p CABG in 2001 and recent NSTEMI with PCI to SVG--> OM/posterior descending (08/2019), ischemic cardiomyopathy/chronic systolic CHF, HTN, OSA, cerebrovascular disease with previous CEA, persistent atrial fibrillation that has failed cardioversion on Eliquis, DMT2, hypothyroidism and severe symptomatic mitral regurgitation who presented to [facility] on 12/23/19 for planned TEER.    Assessment & Plan    Severe MR: s/p successful transcatheter edge-to-edge mitral valve repair using a MitraClip XTW device, positioned A2/P2, reducing mitral regurgitation from 4+ to 1+. Post op echo pending. Resumed on home Plavix and Eliquis. Plan was for DC home yesterday but pt developed dysuria and frank hematuria with foley placement, this has resolved post Foley removal, Hb stable, outpatient urology follow up and cystoscopy will be arranged.  Follow up echo from yesterday shows only mild mitral regurgitation. He will be discharged today.   Bradycardic arrest: procedure complicated by transient bradycardic arrest likely precipitated by coronary air embolism, with prompt resuscitation and return to baseline  hemodynamics/cardiac function. Thankfully, he has fully recovered. No bradycardia on telemetry.  CAD s/p CABG and recent PCI in the setting of NSTEMI: will need to continue on Plavix, likely for at least a year with ACS and PCI. No aspirin given concurrent anticoagulation  Persistent afib/flutter: resumed on eliquis. Rate well controlled   New hematuria: ? Trauma from foley in the setting of plavix and Eliquis therapy. Discussed case with Dr. Claudia Desanctis. Foley now removed. Pt complaining of dysuria. Will repeat UA (pre op UA clean). Platelets are stable at 138, Hb 12.6 today.Dr Keane Scrape note: Discussed with patient and his wife possible sources of gross hematuria including prostate/BPH and possible traumatic foley however cannot rule out bladder malignancy or upper tract source.  Discussed with patient that this would require an office cystoscopy to further evaluate as well as upper tract imaging.  I will let Dr. Junious Silk know that the patient was in the hospital and will arrange follow-up for discussion  of gross hematuria.  As long as patient is voiding spontaneously and urine is clearing no further intervention necessary in the hospital.  Signed, Ena Dawley, MD  12/25/2019, 10:39 AM

## 2019-12-25 NOTE — Plan of Care (Signed)

## 2019-12-25 NOTE — Progress Notes (Signed)
Patient's HR continually drops down into the 30s. CCMD notified this RN that HR got as low as 35. This RN paged on-call Cardiology MD to make them aware. Upon assessment, patient found to be asleep and laying on his side. All other VSS, patient asymptomatic. Pads placed on patient as a precaution. No new orders. Will continue to monitor the patient.

## 2019-12-25 NOTE — Discharge Summary (Addendum)
Discharge Summary    Patient ID: Raymond Christensen MRN: [id number]; DOB: 09-05-1940  Admit date: 12/23/2019 Discharge date: 12/25/2019  Primary Care Provider: [name], DO  Primary Cardiologist: Sinclair Grooms, MD  Primary Electrophysiologist:  [name], MD   Discharge Diagnoses    Principal Problem:   S/P mitral valve clip implantation Active Problems:   Type 2 diabetes mellitus without complication, without long-term current use of insulin ([place])   Morbid obesity ([place])   Essential hypertension   Hypothyroidism   Major depressive disorder   Obstructive sleep apnea on CPAP   Diabetes mellitus type 2 in obese (HCC)   Thrombocytopenia ([place])   CAD (coronary artery disease) of artery bypass graft   Persistent atrial fibrillation (H[facility])   Chronic systolic CHF (congestive heart failure) ([place])   Nonrheumatic mitral valve regurgitation   Mitral valve disease    Diagnostic Studies/Procedures    MV Clip 12/23/19 Successful transcatheter edge-to-edge mitral valve repair using a MitraClip XTW device, positioned A2/P2, reducing mitral regurgitation from 4+ to 1+.  Procedure complicated by transient bradycardic arrest likely precipitated by coronary air embolism, with prompt resuscitation and return to baseline hemodynamics/cardiac function  Recommendations: Continue clopidogrel, resume apixaban tomorrow morning, assess recovery in the post cardiac catheterization recovery area to determine bed placement in either a cardiac progressive care bed versus ICU  TEE 12/23/19 1. Native valve: with primary mitral regurgutation, A2 flail with  posteriorly directed eccentric jet wrapping around the left atrium, flail  gap 3 mm, PISA radius: 1.2 cm, ERO 0.7 cm2, Reg Vol 101 ml consistent with  severe mitral regurgitation. MVA 8  cm2. A XTW MitraClip was successfully placed in the A2-P2 position with  improvement of mitral regurgitation to mild. Mean transmitral  gradient  increased from 2 to 5 mmHg. Mitral regurgitation improved from severe to  mild. LVEF remained the same post  procedu 40-45%. Mild RV systolic dysfunction. There wa trivial pericardial  effusion prior and post procedure. Post procedure there was a small  iatrogenic ASD with left to right flow only.  2. Left ventricular ejection fraction, by estimation, is 40 to 45%. The  left ventricle has mildly decreased function. The left ventricle  demonstrates global hypokinesis. Left ventricular diastolic function could  not be evaluated.  3. Right ventricular systolic function is mildly reduced. The right  ventricular size is mildly enlarged.  4. Left atrial size was severely dilated. No left atrial/left atrial  appendage thrombus was detected.  5. Right atrial size was moderately dilated.  6. The mitral valve is normal in structure. Severe mitral valve  regurgitation. No evidence of mitral stenosis. There is severe  holosystolic prolapse of the middle segment of the anterior leaflet of the  mitral valve.  7. Tricuspid valve regurgitation is mild to moderate.  8. The aortic valve is tricuspid. There is mild calcification of the  aortic valve. There is moderate thickening of the aortic valve. Aortic  valve regurgitation is mild. No aortic stenosis is present.  9. The inferior vena cava is normal in size with greater than 50%  respiratory variability, suggesting right atrial pressure of 3 mmHg.   Conclusion(s)/Recommendation(s): Normal biventricular function without  evidence of hemodynamically significant valvular heart disease.   F/u echo 12/24/19 1. Day 1 post Mitraclip placement, LVEF 40-45%, mitral regurgitation  mild, mean transmitral gradient 2 mmHg.  2. Left ventricular ejection fraction, by estimation, is 40 to 45%. The  left ventricle has mildly decreased function. The left ventricle  demonstrates global hypokinesis. Left ventricular diastolic function could  not  be evaluated.  3. Right ventricular systolic function is mildly reduced. The right  ventricular size is normal.  4. Left atrial size was severely dilated.  5. Right atrial size was moderately dilated.  6. The mitral valve is normal in structure. Mild mitral valve  regurgitation. No evidence of mitral stenosis. The mean mitral valve  gradient is 2.0 mmHg with average heart rate of 61 bpm. There is a XTW  MitralClip present in the mitral position.  Procedure Date: 12/23/2019.  7. The aortic valve is normal in structure. Aortic valve regurgitation is  not visualized. No aortic stenosis is present.  8. The inferior vena cava is normal in size with greater than 50%  respiratory variability, suggesting right atrial pressure of 3 mmHg.    _____________   History of Present Illness     Raymond Christensen is a 79 y.o. male with CAD with MI 1994 c/b cardiac arrest and lytrics/PTCA, CABG in 2001, recent NSTEMI with PCI 08/2019, ischemic cardiomyopathy/chronic systolic CHF, HTN, OSA, cerebrovascular disease with previous CEA, right subclavian stenosis, persistent atrial fibrillation that has failed cardioversion on Eliquis, DMT2,CKD III per labs, hypothyroidism, obesity, and severe symptomatic mitral regurgitation who presented to [facility] on 12/23/19 for planned TEER.   Earlier this year in 08/2019 he had presented to the hospital with NSTEMI and underwent cardiac cath and PCI. He underwent vein graft PCI of the sequential SVG-OM and PDA, treated with drug eluting stents. He was also noted to be in atrial fibrillation. He was started on anticoagulation. He underwent TEE which demonstrated moderately severe MR with A2 partial flail segment with ruptured chordae tendinae. LAA thrombus was demonstrated so he did not undergo DCCV at that time. He was subsequently loaded with oral amiodarone and ultimately underwent cardioversion but reverted back to atrial fibrillation at follow-up. He was referred for structural  heart consultation for treatment options related to mitral regurgitation and ultimately felt to be a candidate for Mitra-Clip. He was admitted 12/23/2019 for this procedure.   Hospital Course     Severe MR: s/p successful transcatheter edge-to-edge mitral valve repair using a MitraClip XTW device, positioned A2/P2, reducing mitral regurgitation from 4+ to 1+. Procedure was complicated by bradycardic arrest which promptly resolved with resuscitation. Admission also notable for hematuria as below.   F/u echo as outlined above which Dr. Meda Coffee reviewed showing only mild mitral regurgitation.   Bradycardic arrest: procedure complicated by transient bradycardic arrest likely precipitated by coronary air embolism, with prompt resuscitation and return to baseline hemodynamics/cardiac function. Thankfully, he has fully recovered. No subsequent bradycardia on telemetry and tolerating BB.  CAD s/p CABG and recent PCI in the setting of NSTEMI: will need to continue on Plavix, likely for at least a year with recent ACS and PCI 08/2019. No aspirin given concurrent anticoagulation  Persistent afib/flutter: resumed on Eliquis. Rate well controlled. Was seen by EP as outpatient prior to this admission and started on amiodarone with possible eye towards ablation in the near future. His original amiodarone rx was listed as 2 tablets daily until 12/09/19 then decrease to 1 tablet daily - he confirms he is taking 1 tablet daily now. Per our discussion I adjusted this on his AVS to clarify with "no print" as he indicated he did not currently wish to have refills sent in since he had plenty with his original rx.  New hematuria: ?Trauma from foley in the setting of plavix and Eliquis therapy,  accompanied by dysuria. Patient seen by urology and Dr. Meda Coffee discussed with Dr. Claudia Desanctis. Per Dr. Keane Scrape note: "Discussed with patient and his wife possible sources of gross hematuria including prostate/BPH and possible traumatic foley  however cannot rule out bladder malignancy or upper tract source.Discussed with patient that this would require an office cystoscopy to further evaluate as well as upper tract imaging. I will let Dr. Junious Silk know that the patient was in the hospital and will arrange follow-up for discussion of gross hematuria. As long as patient is voiding spontaneously and urine is clearing no further intervention necessary in the hospital." Today his urine is pink and Dr. Meda Coffee felt he is stable for discharge home with urology follow-up as planned.  Additional medication management with known chronic systolic CHF, gout, major depressive disorder:  I clarified with pt how he takes his potassium, since it is written as-needed with Lasix. He did well on Lasix 40mg  / KCl 43meq daily while in-patient. He confirms he has been taking the potassium daily but in the event that he needs to hold his Lasix for any reason, he holds the potassium as well. He will continue this regimen. Buspar was removed from his medicine list at discharge as he indicates he's actually not taking this at home. We also advised him to avoid his PRN colchicine given numerous drug interactions with cardiac meds.  Dr. Meda Coffee has seen and examined the patient today and feels he is stable for discharge. The structural heart team had already outlined discharge instructions yesterday in preparation for his discharge today. He has a follow-up visit scheduled in 5 days (12/30/19) with Nell Range. It also appears he has f/u scheduled with both the EP team as well as a post-op echo - will defer to structural team to finalize these plans when seen in follow-up.    Did the patient have an acute coronary syndrome (MI, NSTEMI, STEMI, etc) this admission?:  No                               Did the patient have a percutaneous coronary intervention (stent / angioplasty)?:  No.       _____________  Discharge Vitals Blood pressure 106/82, pulse 68,  temperature 97.7 F (36.5 C), temperature source Oral, resp. rate 18, height 5\' 8"  (1.727 m), weight 109.5 kg, SpO2 97 %.  Filed Weights   12/23/19 0625  Weight: 109.5 kg    Labs & Radiologic Studies    CBC Recent Labs    12/24/19 0005  WBC 12.2*  HGB 12.6*  HCT 39.7  MCV 88.6  PLT 017*   Basic Metabolic Panel Recent Labs    12/24/19 0005  NA 135  K 4.0  CL 103  CO2 23  GLUCOSE 221*  BUN 20  CREATININE 1.28*  CALCIUM 8.7*   _____________  DG Chest 2 View  Result Date: 12/22/2019 CLINICAL DATA:  Preop for heart surgery.  Asymptomatic EXAM: CHEST - 2 VIEW COMPARISON:  09/07/2019 FINDINGS: Prior median sternotomy, similar fractured wires. Midline trachea. Borderline cardiomegaly, accentuated by low lung volumes on the frontal radiograph. No pleural effusion or pneumothorax. No congestive failure. Mild bibasilar atelectasis. IMPRESSION: Borderline cardiomegaly, without congestive failure. Low lung volumes with mild bibasilar atelectasis. Electronically Signed   By: Abigail Miyamoto M.D.   On: 12/22/2019 15:29   CARDIAC CATHETERIZATION  Result Date: 12/23/2019 Successful transcatheter edge-to-edge mitral valve repair using a MitraClip XTW device, positioned  A2/P2, reducing mitral regurgitation from 4+ to 1+. Procedure complicated by transient bradycardic arrest likely precipitated by coronary air embolism, with prompt resuscitation and return to baseline hemodynamics/cardiac function Recommendations: Continue clopidogrel, resume apixaban tomorrow morning, assess recovery in the post cardiac catheterization recovery area to determine bed placement in either a cardiac progressive care bed versus ICU  ECHO TEE  Result Date: 12/23/2019    TRANSESOPHOGEAL ECHO REPORT   Patient Name:   Raymond Christensen Date of Exam: 12/23/2019 Medical Rec #:  [id number]      Height:       68.0 in Accession #:    [id number]     Weight:       241.4 lb Date of Birth:  [birth year]      BSA:          2.214 m  Patient Age:    83 years       BP:           122/65 mmHg Patient Gender: M              HR:           77 bpm. Exam Location:  Inpatient Procedure: 2D Echo, 3D Echo, Color Doppler and Cardiac Doppler Indications:     Mitral Regurgitation  History:         Patient has prior history of Echocardiogram examinations, most                  recent 09/13/2019. CHF, Prior CABG, Arrythmias:Atrial                  Fibrillation; Risk Factors:Hypertension, Diabetes, Dyslipidemia                  and Sleep Apnea.  Sonographer:     Raquel Sarna Senior RDCS Referring Phys:  [place] Diagnosing Phys: Ena Dawley MD  Sonographer Comments: MitraClip Procedure with placement of 1 clip PROCEDURE: The transesophogeal probe was passed without difficulty through the esophogus of the patient. Sedation performed by different physician. The patient was monitored while under deep sedation. The patient's vital signs; including heart rate, blood pressure, and oxygen saturation; remained stable throughout the procedure. The patient developed no complications during the procedure. IMPRESSIONS  1. Native valve: with primary mitral regurgutation, A2 flail with posteriorly directed eccentric jet wrapping around the left atrium, flail gap 3 mm, PISA radius: 1.2 cm, ERO 0.7 cm2, Reg Vol 101 ml consistent with severe mitral regurgitation. MVA 8 cm2. A XTW MitraClip was successfully placed in the A2-P2 position with improvement of mitral regurgitation to mild. Mean transmitral gradient increased from 2 to 5 mmHg. Mitral regurgitation improved from severe to mild. LVEF remained the same post procedu 40-45%. Mild RV systolic dysfunction. There wa trivial pericardial effusion prior and post procedure. Post procedure there was a small iatrogenic ASD with left to right flow only.  2. Left ventricular ejection fraction, by estimation, is 40 to 45%. The left ventricle has mildly decreased function. The left ventricle demonstrates global hypokinesis. Left  ventricular diastolic function could not be evaluated.  3. Right ventricular systolic function is mildly reduced. The right ventricular size is mildly enlarged.  4. Left atrial size was severely dilated. No left atrial/left atrial appendage thrombus was detected.  5. Right atrial size was moderately dilated.  6. The mitral valve is normal in structure. Severe mitral valve regurgitation. No evidence of mitral stenosis. There is severe holosystolic prolapse of the middle segment of the anterior  leaflet of the mitral valve.  7. Tricuspid valve regurgitation is mild to moderate.  8. The aortic valve is tricuspid. There is mild calcification of the aortic valve. There is moderate thickening of the aortic valve. Aortic valve regurgitation is mild. No aortic stenosis is present.  9. The inferior vena cava is normal in size with greater than 50% respiratory variability, suggesting right atrial pressure of 3 mmHg. Conclusion(s)/Recommendation(s): Normal biventricular function without evidence of hemodynamically significant valvular heart disease. FINDINGS  Left Ventricle: Left ventricular ejection fraction, by estimation, is 40 to 45%. The left ventricle has mildly decreased function. The left ventricle demonstrates global hypokinesis. The left ventricular internal cavity size was normal in size. There is  no left ventricular hypertrophy. Left ventricular diastolic function could not be evaluated. Right Ventricle: The right ventricular size is mildly enlarged. No increase in right ventricular wall thickness. Right ventricular systolic function is mildly reduced. Left Atrium: Left atrial size was severely dilated. No left atrial/left atrial appendage thrombus was detected. Right Atrium: Right atrial size was moderately dilated. Pericardium: There is no evidence of pericardial effusion. Mitral Valve: The mitral valve is normal in structure. There is severe holosystolic prolapse of the middle segment of the anterior leaflet of  the mitral valve. Severe mitral valve regurgitation, with posteriorly-directed jet. No evidence of mitral valve stenosis. MV peak gradient, 6.7 mmHg. The mean mitral valve gradient is 2.0 mmHg. Tricuspid Valve: The tricuspid valve is normal in structure. Tricuspid valve regurgitation is mild to moderate. No evidence of tricuspid stenosis. Aortic Valve: The aortic valve is tricuspid. There is mild calcification of the aortic valve. There is moderate thickening of the aortic valve. Aortic valve regurgitation is mild. No aortic stenosis is present. Pulmonic Valve: The pulmonic valve was normal in structure. Pulmonic valve regurgitation is not visualized. No evidence of pulmonic stenosis. Aorta: The aortic root is normal in size and structure. Venous: The inferior vena cava is normal in size with greater than 50% respiratory variability, suggesting right atrial pressure of 3 mmHg. IAS/Shunts: No atrial level shunt detected by color flow Doppler.  MITRAL VALVE MV Peak grad: 6.7 mmHg MV Mean grad: 2.0 mmHg MV Vmax:      1.30 m/s MV Vmean:     74.0 cm/s MR Peak grad:    64.3 mmHg MR Mean grad:    44.0 mmHg MR Vmax:         401.00 cm/s MR Vmean:        310.0 cm/s MR PISA:         9.05 cm MR PISA Eff ROA: 69 mm MR PISA Radius:  1.20 cm Ena Dawley MD Electronically signed by Ena Dawley MD Signature Date/Time: 12/23/2019/12:00:52 PM    Final (Updated)    ECHOCARDIOGRAM LIMITED  Result Date: 12/25/2019    ECHOCARDIOGRAM LIMITED REPORT   Patient Name:   Raymond Christensen Date of Exam: 12/24/2019 Medical Rec #:  [id number]      Height:       68.0 in Accession #:    [id number]     Weight:       241.4 lb Date of Birth:  [birth year]      BSA:          2.214 m Patient Age:    85 years       BP:           126/56 mmHg Patient Gender: M              HR:  61 bpm. Exam Location:  Inpatient Procedure: Limited Echo, Limited Color Doppler and Cardiac Doppler Indications:    Post MitralClip  History:        Patient has  prior history of Echocardiogram examinations, most                 recent 12/23/2019. CHF, CAD and Previous Myocardial Infarction,                 Prior CABG; Risk Factors:Diabetes and Dyslipidemia.                  Mitral Valve: XTW MitralClip valve is present in the mitral                 position. Procedure Date: 12/23/2019.  Sonographer:    Mikki Santee RDCS (AE) Referring Phys: [id number] [place]  1. Day 1 post Mitraclip placement, LVEF 40-45%, mitral regurgitation mild, mean transmitral gradient 2 mmHg.  2. Left ventricular ejection fraction, by estimation, is 40 to 45%. The left ventricle has mildly decreased function. The left ventricle demonstrates global hypokinesis. Left ventricular diastolic function could not be evaluated.  3. Right ventricular systolic function is mildly reduced. The right ventricular size is normal.  4. Left atrial size was severely dilated.  5. Right atrial size was moderately dilated.  6. The mitral valve is normal in structure. Mild mitral valve regurgitation. No evidence of mitral stenosis. The mean mitral valve gradient is 2.0 mmHg with average heart rate of 61 bpm. There is a XTW MitralClip present in the mitral position. Procedure Date: 12/23/2019.  7. The aortic valve is normal in structure. Aortic valve regurgitation is not visualized. No aortic stenosis is present.  8. The inferior vena cava is normal in size with greater than 50% respiratory variability, suggesting right atrial pressure of 3 mmHg. FINDINGS  Left Ventricle: Left ventricular ejection fraction, by estimation, is 40 to 45%. The left ventricle has mildly decreased function. The left ventricle demonstrates global hypokinesis. The left ventricular internal cavity size was normal in size. There is  no left ventricular hypertrophy. Left ventricular diastolic function could not be evaluated. Left ventricular diastolic function could not be evaluated due to atrial fibrillation. Right Ventricle:  The right ventricular size is normal. No increase in right ventricular wall thickness. Right ventricular systolic function is mildly reduced. Left Atrium: Left atrial size was severely dilated. Right Atrium: Right atrial size was moderately dilated. Pericardium: There is no evidence of pericardial effusion. Mitral Valve: The mitral valve is normal in structure. Mild mitral valve regurgitation. There is a XTW MitralClip present in the mitral position. Procedure Date: 12/23/2019. No evidence of mitral valve stenosis. MV peak gradient, 6.1 mmHg. The mean mitral  valve gradient is 2.0 mmHg with average heart rate of 61 bpm. Tricuspid Valve: The tricuspid valve is normal in structure. Tricuspid valve regurgitation is mild . No evidence of tricuspid stenosis. Aortic Valve: The aortic valve is normal in structure. Aortic valve regurgitation is not visualized. No aortic stenosis is present. Pulmonic Valve: The pulmonic valve was normal in structure. Pulmonic valve regurgitation is not visualized. No evidence of pulmonic stenosis. Aorta: The aortic root is normal in size and structure. Venous: The inferior vena cava is normal in size with greater than 50% respiratory variability, suggesting right atrial pressure of 3 mmHg. IAS/Shunts: No atrial level shunt detected by color flow Doppler. LEFT VENTRICLE PLAX 2D LVOT diam:     2.20 cm  Diastology LV SV:  74       LV e' medial:    8.70 cm/s LV SV Index:   31       LV E/e' medial:  12.3 LVOT Area:     3.80 cm LV e' lateral:   8.81 cm/s                         LV E/e' lateral: 12.1  AORTIC VALVE LVOT Vmax:   91.10 cm/s LVOT Vmean:  60.500 cm/s LVOT VTI:    0.182 m MITRAL VALVE MV Area (PHT): 3.48 cm     SHUNTS MV Peak grad:  6.1 mmHg     Systemic VTI:  0.18 m MV Mean grad:  2.0 mmHg     Systemic Diam: 2.20 cm MV Vmax:       1.24 m/s MV Vmean:      62.8 cm/s MV Decel Time: 218 msec MV E velocity: 107.00 cm/s MV A velocity: 46.60 cm/s MV E/A ratio:  2.30 Ena Dawley MD  Electronically signed by Ena Dawley MD Signature Date/Time: 12/25/2019/10:57:43 AM    Final    Disposition   Pt is being discharged home today in good condition.  Follow-up Plans & Appointments     Follow-up Information    Eileen Stanford, PA-C Follow up.   Specialties: Cardiology, Radiology Why: [facility] - a follow-up has been scheduled for you on Thursday December 30, 2019 3:30 PM (Arrive by 3:15 PM). Contact information: [address] [place] [address] [phone]        [facility] Follow up.   Why: Their office will call you to arrange an appointment. Contact information: [place] [phone]             Discharge Instructions    Amb Referral to Cardiac Rehabilitation   Complete by: As directed    Diagnosis: Valve Repair   Valve: Mitral Comment - mitraclip   After initial evaluation and assessments completed: Virtual Based Care may be provided alone or in conjunction with Phase 2 Cardiac Rehab based on patient barriers.: Yes   Diet - low sodium heart healthy   Complete by: As directed    Discharge instructions   Complete by: As directed    Buspirone was removed from your list since you indicated that you were not taking it. If you get home and realize you were on this medicine, call us so we can update our records.  Do not take any further colchicine. This was an as-needed gout medicine but it was discontinued from your medicine list due to drug interaction with some of your other heart medicines. You can talk to your primary care about alternatives.   Increase activity slowly   Complete by: As directed    Please see attached sheet at the end of your After-Visit Summary for instructions on wound care, activity, and bathing.      Discharge Medications   Allergies as of 12/25/2019      Reactions   Sulfa Antibiotics Rash   Questionable, he developed a diffuse  rash 2 days after stopping Bactrim      Medication List    STOP taking these medications   busPIRone 7.5 MG tablet Commonly known as: BUSPAR   colchicine 0.6 MG tablet     TAKE these medications   acetaminophen 500 MG tablet Commonly known as: TYLENOL Take 1,000 mg by mouth  every 6 (six) hours as needed for moderate pain or headache.   amiodarone 200 MG tablet Commonly known as: PACERONE Take 1 tablet (200 mg total) by mouth daily.   apixaban 5 MG Tabs tablet Commonly known as: ELIQUIS Take 1 tablet (5 mg total) by mouth 2 (two) times daily.   atorvastatin 80 MG tablet Commonly known as: LIPITOR Take 1 tablet (80 mg total) by mouth daily.   carvedilol 6.25 MG tablet Commonly known as: COREG Take 1 tablet (6.25 mg total) by mouth 2 (two) times daily with a meal.   clopidogrel 75 MG tablet Commonly known as: PLAVIX Take 1 tablet (75 mg total) by mouth daily with breakfast.   ezetimibe 10 MG tablet Commonly known as: ZETIA Take 1 tablet (10 mg total) by mouth daily.   famotidine 20 MG tablet Commonly known as: PEPCID Take 1 tablet (20 mg total) by mouth daily.   finasteride 5 MG tablet Commonly known as: PROSCAR TAKE 1 TABLET DAILY   furosemide 40 MG tablet Commonly known as: LASIX Take 1 tablet (40 mg total) by mouth daily.   levocetirizine 5 MG tablet Commonly known as: XYZAL TAKE 1 TABLET EVERY EVENING   levothyroxine 75 MCG tablet Commonly known as: SYNTHROID Take 1 tablet (75 mcg total) by mouth daily.   liothyronine 5 MCG tablet Commonly known as: CYTOMEL TAKE 2 TABLETS DAILY   MULTIVITAMIN ADULT EXTRA C PO Take 1 tablet by mouth daily.   nitroGLYCERIN 0.4 MG SL tablet Commonly known as: NITROSTAT Place 1 tablet (0.4 mg total) under the tongue every 5 (five) minutes as needed for chest pain.   omega-3 acid ethyl esters 1 g capsule Commonly known as: LOVAZA TAKE 2 CAPSULES TWICE DAILY   potassium chloride 10 MEQ tablet Commonly known as:  Klor-Con 10 Take 2 tablets (20 mEq total) by mouth daily as needed (with every 40mg  dose of Lasix). What changed: when to take this   sacubitril-valsartan 24-26 MG Commonly known as: ENTRESTO Take 1 tablet by mouth 2 (two) times daily.   spironolactone 25 MG tablet Commonly known as: ALDACTONE Take 0.5 tablets (12.5 mg total) by mouth daily.   tamsulosin 0.4 MG Caps capsule Commonly known as: FLOMAX TAKE 1 CAPSULE DAILY   vitamin B-12 1000 MCG tablet Commonly known as: CYANOCOBALAMIN Take 1,000 mcg by mouth daily.   Vitamin D3 250 MCG (10000 UT) capsule Take 10,000 Units by mouth daily.          Outstanding Labs/Studies   F/u structural team for further recs  Duration of Discharge Encounter   Greater than 30 minutes including physician time.  Signed, Charlie Pitter, PA-C 12/25/2019, 2:20 PM

## 2019-12-25 NOTE — Progress Notes (Signed)
Pt got discharged to home, discharge instructions provided and patient showed understanding to it, IV taken out,Telemonitor DC,pt left unit in wheelchair with all of the belongings accompanied with a family member (wife)  Chenee Munns,RN 

## 2019-12-27 ENCOUNTER — Telehealth: Payer: Self-pay | Admitting: Physician Assistant

## 2019-12-27 ENCOUNTER — Telehealth: Payer: Self-pay | Admitting: *Deleted

## 2019-12-27 DIAGNOSIS — Z01812 Encounter for preprocedural laboratory examination: Secondary | ICD-10-CM

## 2019-12-27 DIAGNOSIS — I4819 Other persistent atrial fibrillation: Secondary | ICD-10-CM

## 2019-12-27 NOTE — Telephone Encounter (Signed)
  [place] VALVE TEAM   Patient contacted regarding discharge from [facility] on 12/10  Patient understands to follow up with provider Nell Range on 12/16 at [facility].  Patient understands discharge instructions? yes Patient understands medications and regimen? yes Patient understands to bring all medications to this visit? yes  Angelena Form PA-C  [facility]

## 2019-12-27 NOTE — Telephone Encounter (Signed)
Called pt to inform him that I would confirm that it is ok to proceed with planned ablation on 02/01/20 being that he had MVR procedure on 12/9.   Aware that once approved I will place pre procedure orders for CT/blood work. Aware office may call to schedule CT prior to [place] seeing him on 12/30.  Aware we will go over all instructions at 12/30 OV. Pt agreeable to plan.

## 2019-12-28 ENCOUNTER — Telehealth: Payer: Self-pay | Admitting: *Deleted

## 2019-12-28 NOTE — Telephone Encounter (Signed)
It is okay to proceed as scheduled. He will be out past 30 days from the [place]. thanks

## 2019-12-28 NOTE — Telephone Encounter (Signed)
Dr. Burt Knack: MVR 12/9 Dr. Curt Bears wanted to get your approval/advisement --- pt is scheduled for AFib ablation 02/01/20 -- ok to proceed or would you prefer [place] to move him out further?

## 2019-12-29 ENCOUNTER — Telehealth (HOSPITAL_COMMUNITY): Payer: Self-pay

## 2019-12-29 ENCOUNTER — Telehealth: Payer: Self-pay

## 2019-12-29 NOTE — Telephone Encounter (Signed)
Attempted to call patient in regards to Cardiac Rehab - unable to leave VM 

## 2019-12-29 NOTE — Telephone Encounter (Signed)
Pt called to try and move up his apt tomorrow with Jo[facility] Jersey due to hematuria. The pt followed up with Urology and there were no issues noted with kidneys or bladder and bleeding was felt to be from foley. The blood had cleared up yesterday but now he is starting to have blood in his urine and pass clots. He is on both Eliquis and Plavix.     Discussed with Dr Burt Knack and Nell Range PA-C and pt's apt was moved to 1:00 PM on 12/16.  The pt will hold Eliquis at this time and continue Plavix until further instruction.  When I called the pt with plan he advised that an ultrasound was performed by Urology and he did not have a cystoscopy.

## 2019-12-30 ENCOUNTER — Ambulatory Visit: Payer: Medicare (Managed Care) | Admitting: Physician Assistant

## 2019-12-30 ENCOUNTER — Other Ambulatory Visit: Payer: Self-pay

## 2019-12-30 ENCOUNTER — Ambulatory Visit (INDEPENDENT_AMBULATORY_CARE_PROVIDER_SITE_OTHER): Payer: Medicare (Managed Care)

## 2019-12-30 ENCOUNTER — Encounter: Payer: Self-pay | Admitting: Physician Assistant

## 2019-12-30 ENCOUNTER — Encounter: Payer: Self-pay | Admitting: *Deleted

## 2019-12-30 ENCOUNTER — Ambulatory Visit (INDEPENDENT_AMBULATORY_CARE_PROVIDER_SITE_OTHER): Payer: Medicare (Managed Care) | Admitting: Physician Assistant

## 2019-12-30 VITALS — BP 110/60 | HR 60 | Ht 68.0 in | Wt 233.8 lb

## 2019-12-30 DIAGNOSIS — I4819 Other persistent atrial fibrillation: Secondary | ICD-10-CM | POA: Diagnosis not present

## 2019-12-30 DIAGNOSIS — Z9889 Other specified postprocedural states: Secondary | ICD-10-CM | POA: Diagnosis not present

## 2019-12-30 DIAGNOSIS — R001 Bradycardia, unspecified: Secondary | ICD-10-CM

## 2019-12-30 DIAGNOSIS — I25700 Atherosclerosis of coronary artery bypass graft(s), unspecified, with unstable angina pectoris: Secondary | ICD-10-CM

## 2019-12-30 DIAGNOSIS — R319 Hematuria, unspecified: Secondary | ICD-10-CM

## 2019-12-30 DIAGNOSIS — Z95818 Presence of other cardiac implants and grafts: Secondary | ICD-10-CM

## 2019-12-30 LAB — CBC WITH DIFFERENTIAL/PLATELET
Basophils Absolute: 0 10*3/uL (ref 0.0–0.2)
Basos: 0 %
EOS (ABSOLUTE): 0.1 10*3/uL (ref 0.0–0.4)
Eos: 1 %
Hematocrit: 40.7 % (ref 37.5–51.0)
Hemoglobin: 13.7 g/dL (ref 13.0–17.7)
Lymphocytes Absolute: 2 10*3/uL (ref 0.7–3.1)
Lymphs: 21 %
MCH: 29 pg (ref 26.6–33.0)
MCHC: 33.7 g/dL (ref 31.5–35.7)
MCV: 86 fL (ref 79–97)
Monocytes Absolute: 1 10*3/uL — ABNORMAL HIGH (ref 0.1–0.9)
Monocytes: 11 %
Neutrophils Absolute: 6.4 10*3/uL (ref 1.4–7.0)
Neutrophils: 67 %
Platelets: 162 10*3/uL (ref 150–450)
RBC: 4.72 x10E6/uL (ref 4.14–5.80)
RDW: 14.1 % (ref 11.6–15.4)
WBC: 9.6 10*3/uL (ref 3.4–10.8)

## 2019-12-30 MED ORDER — AMOXICILLIN 500 MG PO TABS: ORAL_TABLET | ORAL | 11 refills | Status: DC

## 2019-12-30 NOTE — Patient Instructions (Signed)
Medication Instructions:  1) RESUME ELIQUIS on Monday, December 20 2) Your provider discussed the importance of taking an antibiotic prior to all dental visits to prevent damage to the heart valves from infection. You were given a prescription for AMOXIL 2,000 mg to take one hour prior to any dental appointment.  *If you need a refill on your cardiac medications before your next appointment, please call your pharmacy*  Lab Work: TODAY! CBC If you have labs (blood work) drawn today and your tests are completely normal, you will receive your results only by: Marland Kitchen MyChart Message (if you have MyChart) OR . A paper copy in the mail If you have any lab test that is abnormal or we need to change your treatment, we will call you to review the results.  Follow-Up: Please keep your appointments as scheduled!   ZIO XT- Long Term Monitor Instructions  Your physician has requested you wear your ZIO patch monitor 3 days.   This is a single patch monitor.  [employer] supplies one patch monitor per enrollment.  Additional stickers are not available.   Please do not apply patch if you will be having a Nuclear Stress Test, Echocardiogram, Cardiac CT, MRI, or Chest Xray during the time frame you would be wearing the monitor. The patch cannot be worn during these tests.  You cannot remove and re-apply the ZIO XT patch monitor.   Your ZIO patch monitor will be sent USPS Priority mail from [facility] directly to your home address. The monitor may also be mailed to a PO BOX if home delivery is not available.   It may take 3-5 days to receive your monitor after you have been enrolled.   Once you have received you monitor, please review enclosed instructions.  Your monitor has already been registered assigning a specific monitor serial # to you.   Applying the monitor   Shave hair from upper left chest.   Hold abrader disc by orange tab.  Rub abrader in 40 strokes over left upper chest as indicated in  your monitor instructions.   Clean area with 4 enclosed alcohol pads .  Use all pads to assure are is cleaned thoroughly.  Let dry.   Apply patch as indicated in monitor instructions.  Patch will be place under collarbone on left side of chest with arrow pointing upward.   Rub patch adhesive wings for 2 minutes.Remove white label marked "1".  Remove white label marked "2".  Rub patch adhesive wings for 2 additional minutes.   While looking in a mirror, press and release button in center of patch.  A small green light will flash 3-4 times .  This will be your only indicator the monitor has been turned on.     Do not shower for the first 24 hours.  You may shower after the first 24 hours.   Press button if you feel a symptom. You will hear a small click.  Record Date, Time and Symptom in the Patient Log Book.   When you are ready to remove patch, follow instructions on last 2 pages of Patient Log Book.  Stick patch monitor onto last page of Patient Log Book.   Place Patient Log Book in [place] box.  Use locking tab on box and tape box closed securely.  The [employer] has [employer] on it.  Please place in mailbox as soon as possible.  Your physician should have your test results approximately 7 days after the monitor  has been mailed back to [place].   Call [place] at [phone] if you have questions regarding your ZIO XT patch monitor.  Call them immediately if you see an orange light blinking on your monitor.   If your monitor falls off in less than 4 days contact our Monitor department at [phone].  If your monitor becomes loose or falls off after 4 days call [employer] at [phone] for suggestions on securing your monitor.

## 2019-12-30 NOTE — Progress Notes (Signed)
Patient ID: Raymond Christensen, male   DOB: 06-21-1940, 79 y.o.   MRN: [id number] Patient enrolled for [employer] to ship a 3 day ZIO XT long term holter monitor to his home.

## 2019-12-30 NOTE — Addendum Note (Signed)
Addended by: Eileen Stanford on: 12/30/2019 02:23 PM   Modules accepted: Level of Service

## 2019-12-30 NOTE — Progress Notes (Signed)
[facility] [place]                                       Cardiology Office Note    Date:  12/30/2019   ID:  Raymond Christensen, DOB 07/04/1940, MRN [id number]  PCP:  [name], DO  Primary Cardiologist: Sinclair Grooms, MD  Primary Electrophysiologist:  [name], MD   CC: [facility] s/p Mitraclip  History of Present Illness:  Raymond Christensen is a 79 y.o. male with CAD with MI 1994 c/b cardiac arrest and lytrics/PTCA, CABG in 2001, recent NSTEMI with PCI 08/2019, ischemic cardiomyopathy/chronic systolic CHF, HTN, OSA, cerebrovascular disease with previous CEA, right subclavian stenosis, persistent atrial fibrillation that has failed cardioversion on Eliquis, DMT2,CKD III per labs, hypothyroidism, obesity, and severe symptomatic mitral regurgitation s/p TEER (12/23/19) who presents to clinic for follow up.  Earlier this year in 08/2019 he had presented to the hospital with NSTEMI and underwent cardiac cath and PCI. He underwent vein graft PCI of the sequential SVG-OM and PDA, treated with drug eluting stents. He was also noted to be in atrial fibrillation. He was started on anticoagulation. He underwent TEE which demonstrated moderately severe MR with A2 partial flail segment with ruptured chordae tendinae. LAA thrombus was demonstrated so he did not undergo DCCV at that time. He was subsequently loaded with oral amiodarone and ultimately underwent cardioversion but reverted back to atrial fibrillation at follow-up. He was referred for structural heart consultation for treatment options related to mitral regurgitation and ultimately felt to be a candidate for Mitra-Clip.  He underwent successful transcatheter edge-to-edge mitral valve repair using a MitraClip XTW device, positioned A2/P2, reducing mitral regurgitation from 4+ to 1+ on 12/25/19. Procedure was complicated by bradycardic arrest which promptly resolved with resuscitation.  Admission also notable for hematuria. He was seen by urology who planned outpatient follow up. Post op echo showed only mild MR.   He was seen by urology PA on Monday 12/27/19 and an ultrasound of his kidneys and bladder was reportedly normal. Ua also negative for bacteria. Plans were made for a CT scan and cystoscopy in Feb. He called our office yesterday with more hematuria and passing clots and we asked him to hold his Eliquis temporarily. Last dose 12/15 AM.   Today he presents to clinic for follow up. Here with wife. Feeling okay. He has not had any appreciable change in symptoms since having clip. No chest pain. No LE edema, orthopnea or PND. Still having mild blood in urine and clots but it has improved. They are disappointed with their care at urology office. Pt said during admission he has several episodes of nocturnal bradycardia and he was told a 48 hour holter monitor would be ordered.   Past Medical History:  Diagnosis Date  . Arthritis   . CAD (coronary artery disease)   . Carotid artery disease ([place])    s/p left CEA 01/17/14  . Chronic systolic CHF (congestive heart failure) ([place])   . Concussion Summer 2012  . Essential hypertension 02/26/2017  . Heart disease   . Hyperlipidemia   . Hypothyroidism 10/15/2017  . Major depressive disorder   . Mitral valve regurgitation   . Myocardial infarction ([place]) 1996, 2021  . Obstructive sleep apnea on CPAP   . Persistent atrial fibrillation ([place])   . Poor flexibility of tendon 12/01/2018  .  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 Burt Knack  . Subungual hematoma of digit of hand 12/01/2018  . Subungual hematoma of right foot 12/01/2018  . Thyroid disease   . Type 2 diabetes mellitus without complication, without long-term current use of insulin ([place]) 02/26/2017    Past Surgical History:  Procedure Laterality Date  . BYPASS GRAFT  2001  . CARDIAC CATHETERIZATION    . CARDIOVERSION N/A  11/08/2019   Procedure: CARDIOVERSION;  Surgeon: [name], MD;  Location: [facility] ENDOSCOPY;  Service: Cardiovascular;  Laterality: N/A;  . CAROTID ENDARTERECTOMY Left 01/17/2014  . CORONARY ANGIOPLASTY    . CORONARY ARTERY BYPASS GRAFT    . MITRAL VALVE REPAIR N/A 12/23/2019   Procedure: MITRAL VALVE REPAIR;  Surgeon: [name], MD;  Location: [place] CV LAB;  Service: Cardiovascular;  Laterality: N/A;  . RIGHT/LEFT HEART CATH AND CORONARY/GRAFT ANGIOGRAPHY N/A 09/09/2019   Procedure: RIGHT/LEFT HEART CATH AND CORONARY/GRAFT ANGIOGRAPHY;  Surgeon: [name], MD;  Location: [place] CV LAB;  Service: Cardiovascular;  Laterality: N/A;  . TEE WITHOUT CARDIOVERSION N/A 09/13/2019   Procedure: TRANSESOPHAGEAL ECHOCARDIOGRAM (TEE);  Surgeon: [name], MD;  Location[facility] ENDOSCOPY;  Service: Cardiovascular;  Laterality: N/A;  . TEE WITHOUT CARDIOVERSION N/A 12/23/2019   Procedure: TRANSESOPHAGEAL ECHOCARDIOGRAM (TEE);  Surgeon: [name], MD;  Location: [place] CV LAB;  Service: Cardiovascular;  Laterality: N/A;  . TOTAL HIP ARTHROPLASTY  1994, 1995    Current Medications: Outpatient Medications Prior to Visit  Medication Sig Dispense Refill  . acetaminophen (TYLENOL) 500 MG tablet Take 1,000 mg by mouth every 6 (six) hours as needed for moderate pain or headache.    Marland Kitchen amiodarone (PACERONE) 200 MG tablet Take 1 tablet (200 mg total) by mouth daily.    Marland Kitchen apixaban (ELIQUIS) 5 MG TABS tablet Take 1 tablet (5 mg total) by mouth 2 (two) times daily. 180 tablet 3  . atorvastatin (LIPITOR) 80 MG tablet Take 1 tablet (80 mg total) by mouth daily. 90 tablet 1  . carvedilol (COREG) 6.25 MG tablet Take 1 tablet (6.25 mg total) by mouth 2 (two) times daily with a meal. 180 tablet 3  . Cholecalciferol (VITAMIN D3) 250 MCG (10000 UT) capsule Take 10,000 Units by mouth daily.     . clopidogrel (PLAVIX) 75 MG tablet Take 1 tablet (75 mg total) by mouth daily with  breakfast. 30 tablet 3  . ezetimibe (ZETIA) 10 MG tablet Take 1 tablet (10 mg total) by mouth daily. 90 tablet 1  . famotidine (PEPCID) 20 MG tablet Take 1 tablet (20 mg total) by mouth daily. 30 tablet 3  . finasteride (PROSCAR) 5 MG tablet TAKE 1 TABLET DAILY (Patient taking differently: Take 5 mg by mouth daily.) 90 tablet 1  . furosemide (LASIX) 40 MG tablet Take 1 tablet (40 mg total) by mouth daily. 90 tablet 3  . levocetirizine (XYZAL) 5 MG tablet TAKE 1 TABLET EVERY EVENING (Patient taking differently: Take 5 mg by mouth every evening.) 90 tablet 1  . levothyroxine (SYNTHROID) 75 MCG tablet Take 1 tablet (75 mcg total) by mouth daily. 90 tablet 1  . liothyronine (CYTOMEL) 5 MCG tablet TAKE 2 TABLETS DAILY (Patient taking differently: Take 10 mcg by mouth daily.) 180 tablet 3  . Multiple Vitamins-Minerals (MULTIVITAMIN ADULT EXTRA C PO) Take 1 tablet by mouth daily.     . nitroGLYCERIN (NITROSTAT) 0.4 MG SL tablet Place 1 tablet (0.4  mg total) under the tongue every 5 (five) minutes as needed for chest pain. 30 tablet 12  . omega-3 acid ethyl esters (LOVAZA) 1 g capsule TAKE 2 CAPSULES TWICE DAILY 360 capsule 2  . potassium chloride (KLOR-CON 10) 10 MEQ tablet Take 2 tablets (20 mEq total) by mouth daily as needed (with every 40mg  dose of Lasix). (Patient taking differently: Take 20 mEq by mouth daily.) 90 tablet 1  . sacubitril-valsartan (ENTRESTO) 24-26 MG Take 1 tablet by mouth 2 (two) times daily. 180 tablet 3  . spironolactone (ALDACTONE) 25 MG tablet Take 0.5 tablets (12.5 mg total) by mouth daily. 135 tablet 3  . tamsulosin (FLOMAX) 0.4 MG CAPS capsule TAKE 1 CAPSULE DAILY (Patient taking differently: Take 0.4 mg by mouth daily.) 90 capsule 3  . vitamin B-12 (CYANOCOBALAMIN) 1000 MCG tablet Take 1,000 mcg by mouth daily.     No facility-administered medications prior to visit.     Allergies:   Sulfa antibiotics   Social History   Socioeconomic History  . Marital status: Married     Spouse name: Not on file  . Number of children: Not on file  . Years of education: 25  . Highest education level: Doctorate  Occupational History  . Not on file  Tobacco Use  . Smoking status: Former Smoker    Types: Cigarettes  . Smokeless tobacco: Never Used  Vaping Use  . Vaping Use: Never used  Substance and Sexual Activity  . Alcohol use: Yes    Alcohol/week: 3.0 standard drinks    Types: 3 Standard drinks or equivalent per week    Comment: 1 drink 3 times per week  . Drug use: No  . Sexual activity: Yes  Other Topics Concern  . 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 Difficulty of Paying Living Expenses: Not hard at all  Food Insecurity: Not on file  Transportation Needs: Not on file  Physical Activity: Not on file  Stress: Not on file  Social Connections: Not on file     Family History:  The patient's family history includes Dementia in his father.     ROS:   Please see the history of present illness.    ROS All other systems reviewed and are negative.   PHYSICAL EXAM:   VS:  BP 110/60   Pulse 60   Ht 5\' 8"  (1.727 m)   Wt 233 lb 12.8 oz (106.1 kg)   SpO2 98%   BMI 35.55 kg/m    GEN: Well nourished, well developed, in no acute distress, overweight HEENT: normal Neck: no JVD or masses Cardiac: irreg irreg; no murmurs, rubs, or gallops,no edema  Respiratory:  clear to auscultation bilaterally, normal work of breathing GI: soft, nontender, nondistended, + BS MS: no deformity or atrophy Skin: warm and dry, no rash.  Groin site clear without hematoma or ecchymosis  Neuro:  Alert and Oriented x 3, Strength and sensation are intact Psych: euthymic mood, full affect   Wt Readings from Last 3 Encounters:  12/30/19 233 lb  12.8 oz (106.1 kg)  12/23/19 241 lb 6.4 oz (109.5 kg)  12/21/19 241 lb 6.4 oz (109.5 kg)  Studies/Labs Reviewed:   EKG:  EKG is NOT ordered today.   Recent Labs: 09/11/2019: Magnesium 1.8 09/30/2019: TSH 2.990 12/21/2019: ALT 43; B Natriuretic Peptide 151.2 12/24/2019: BUN 20; Creatinine, Ser 1.28; Hemoglobin 12.6; Platelets 138; Potassium 4.0; Sodium 135   Lipid Panel    Component Value Date/Time   CHOL 135 09/08/2019 1254   TRIG 85 09/08/2019 1254   HDL 42 09/08/2019 1254   CHOLHDL 3.2 09/08/2019 1254   VLDL 17 09/08/2019 1254   LDLCALC 76 09/08/2019 1254   LDLDIRECT 76.0 06/22/2019 1503    Additional studies/ records that were reviewed today include:  MV Clip 12/23/19 Successful transcatheter edge-to-edge mitral valve repair using a MitraClip XTW device, positioned A2/P2, reducing mitral regurgitation from 4+ to 1+.  Procedure complicated by transient bradycardic arrest likely precipitated by coronary air embolism, with prompt resuscitation and return to baseline hemodynamics/cardiac function  Recommendations: Continue clopidogrel, resume apixaban tomorrow morning, assess recovery in the post cardiac catheterization recovery area to determine bed placement in either a cardiac progressive care bed versus ICU  TEE 12/23/19 1. Native valve: with primary mitral regurgutation, A2 flail with  posteriorly directed eccentric jet wrapping around the left atrium, flail  gap 3 mm, PISA radius: 1.2 cm, ERO 0.7 cm2, Reg Vol 101 ml consistent with  severe mitral regurgitation. MVA 8  cm2. A XTW MitraClip was successfully placed in the A2-P2 position with  improvement of mitral regurgitation to mild. Mean transmitral gradient  increased from 2 to 5 mmHg. Mitral regurgitation improved from severe to  mild. LVEF remained the same post  procedu 40-45%. Mild RV systolic dysfunction. There wa trivial pericardial  effusion prior and post procedure. Post procedure there was a small   iatrogenic ASD with left to right flow only.  2. Left ventricular ejection fraction, by estimation, is 40 to 45%. The  left ventricle has mildly decreased function. The left ventricle  demonstrates global hypokinesis. Left ventricular diastolic function could  not be evaluated.  3. Right ventricular systolic function is mildly reduced. The right  ventricular size is mildly enlarged.  4. Left atrial size was severely dilated. No left atrial/left atrial  appendage thrombus was detected.  5. Right atrial size was moderately dilated.  6. The mitral valve is normal in structure. Severe mitral valve  regurgitation. No evidence of mitral stenosis. There is severe  holosystolic prolapse of the middle segment of the anterior leaflet of the  mitral valve.  7. Tricuspid valve regurgitation is mild to moderate.  8. The aortic valve is tricuspid. There is mild calcification of the  aortic valve. There is moderate thickening of the aortic valve. Aortic  valve regurgitation is mild. No aortic stenosis is present.  9. The inferior vena cava is normal in size with greater than 50%  respiratory variability, suggesting right atrial pressure of 3 mmHg.   Conclusion(s)/Recommendation(s): Normal biventricular function without  evidence of hemodynamically significant valvular heart disease.   F/u echo 12/24/19 1. Day 1 post Mitraclip placement, LVEF 40-45%, mitral regurgitation  mild, mean transmitral gradient 2 mmHg.  2. Left ventricular ejection fraction, by estimation, is 40 to 45%. The  left ventricle has mildly decreased function. The left ventricle  demonstrates global hypokinesis. Left ventricular diastolic function could  not be evaluated.  3. Right ventricular systolic function is mildly reduced. The right  ventricular size is normal.  4. Left atrial size was severely dilated.  5. Right atrial size was moderately dilated.  6. The mitral valve is normal  in structure. Mild mitral  valve  regurgitation. No evidence of mitral stenosis. The mean mitral valve  gradient is 2.0 mmHg with average heart rate of 61 bpm. There is a XTW  MitralClip present in the mitral position.  Procedure Date: 12/23/2019.  7. The aortic valve is normal in structure. Aortic valve regurgitation is  not visualized. No aortic stenosis is present.  8. The inferior vena cava is normal in size with greater than 50%  respiratory variability, suggesting right atrial pressure of 3 mmHg.    _____________    ASSESSMENT & PLAN:   Severe MR s/p TEER: doing well. Continue on plavix and eliquis currently held given hematuria. He will resume this Monday. SBE prophylaxis discussed; I have RX'd amoxicillin. He will be seen back in Jan for echo and follow up with Dr. Tamala Julian as previously scheduled (he preferred to keep apt with Dr. Tamala Julian vs cancelling and coming into see me).   Bradycardic arrest: procedure complicated by transient bradycardic arrest likely precipitated by coronary air embolism, with prompt resuscitation and return to baseline hemodynamics/cardiac function. Thankfully, he has fully recovered.The pt noted that his HR's kept dipping into the 30s and sounding alarms at night and was told by Dr. Burt Knack that a holter monitor would be placed. I do not see any documentation of this but will order a 3 day Zio XT to follow this.   CAD s/p CABG and recent PCI in the setting of NSTEMI: will need to continue on Plavix, for at least a year with recent ACS and PCI 08/2019. No aspirin given concurrent anticoagulation  Persistent afib/flutter: rate well controlled. Was seen by EP as outpatient prior to this admission and started on amiodarone with possible eye towards ablation in the near future, set up for Feb 01, 2020. Eliquis currently on hold with persistent hematuria. I have asked him to resume this on Monday 12/20  New hematuria: possibly from foley trauma. He wants it noted in his chart that he DOES NOT  [place] IF AT ALL POSSIBLE. He was seen by urology PA on Monday 12/27/19 and an ultrasound of his kidneys and bladder was reportedly normal. Ua also negative for bacteria. Plans were made for a CT scan and cystoscopy in Feb. Will check CBC with frank hematuria.     Medication Adjustments/Labs and Tests Ordered: Current medicines are reviewed at length with the patient today.  Concerns regarding medicines are outlined above.  Medication changes, Labs and Tests ordered today are listed in the Patient Instructions below. There are no Patient Instructions on file for this visit.   Signed, Angelena Form, PA-C  12/30/2019 1:13 PM    [facility] [address], [place], [place]  [address] Phone: [phone]; Fax: [phone]

## 2019-12-31 NOTE — Telephone Encounter (Signed)
Notified wife (pt at appt).  She will inform pt.

## 2020-01-03 DIAGNOSIS — I4819 Other persistent atrial fibrillation: Secondary | ICD-10-CM

## 2020-01-03 DIAGNOSIS — R001 Bradycardia, unspecified: Secondary | ICD-10-CM | POA: Diagnosis not present

## 2020-01-06 ENCOUNTER — Other Ambulatory Visit: Payer: Self-pay | Admitting: Family Medicine

## 2020-01-10 ENCOUNTER — Encounter: Payer: Self-pay | Admitting: Family Medicine

## 2020-01-10 ENCOUNTER — Ambulatory Visit: Payer: Medicare (Managed Care) | Admitting: Family Medicine

## 2020-01-10 ENCOUNTER — Other Ambulatory Visit: Payer: Self-pay

## 2020-01-10 VITALS — BP 124/62 | HR 72 | Temp 97.7°F | Resp 12 | Ht 68.0 in | Wt 239.4 lb

## 2020-01-10 DIAGNOSIS — M6208 Separation of muscle (nontraumatic), other site: Secondary | ICD-10-CM

## 2020-01-10 NOTE — Progress Notes (Signed)
No chief complaint on file.   Subjective: Patient is a 79 y.o. male here for lump in abd. Here w spouse.   1 week of a lump in central abd region. No N/V/D/pain. No inj.   Past Medical History:  Diagnosis Date  . Arthritis   . CAD (coronary artery disease)   . Carotid artery disease ([place])    s/p left CEA 01/17/14  . Chronic systolic CHF (congestive heart failure) ([place])   . Concussion Summer 2012  . Essential hypertension 02/26/2017  . Heart disease   . Hyperlipidemia   . Hypothyroidism 10/15/2017  . Major depressive disorder   . Mitral valve regurgitation   . Myocardial infarction ([place]) 1996, 2021  . Obstructive sleep apnea on CPAP   . Persistent atrial fibrillation ([place])   . Poor flexibility of tendon 12/01/2018  . 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 Burt Knack  . Subungual hematoma of digit of hand 12/01/2018  . Subungual hematoma of right foot 12/01/2018  . Thyroid disease   . Type 2 diabetes mellitus without complication, without long-term current use of insulin ([facility]) 02/26/2017    Objective: BP 124/62 (BP Location: Left Arm, Patient Position: Sitting, Cuff Size: Normal)   Pulse 72   Temp 97.7 F (36.5 C)   Resp 12   Ht 5\' 8"  (1.727 m)   Wt 239 lb 6.4 oz (108.6 kg)   BMI 36.40 kg/m  General: Awake, appears stated age Abd: BS+, central mass more apparent w valsalva.  Heart: RRR Lungs: No accessory muscle use Psych: Age appropriate judgment and insight, normal affect and mood  Assessment and Plan: Diastasis recti  Reassurance. Info written down.  The patient voiced understanding and agreement to the plan.  [place], DO 01/10/20  3:56 PM

## 2020-01-10 NOTE — Patient Instructions (Signed)
Diastasis Recti  Diastasis recti is when the muscles of the abdomen (rectus abdominis muscles) become thin and separate. The result is a wider space between the right and left abdomen (abdominal) muscles. This wider space between the muscles may cause a bulge in the middle of your abdomen. You may notice this bulge when you are straining or when you sit up from a lying down position. Diastasis recti can affect men and women. It is most common among pregnant women, infants, people who are obese, and people who have had abdominal surgery. Exercise or surgical treatment may help correct it. What are the causes? Common causes of this condition include:  Pregnancy. The growing uterus puts pressure on the abdominal muscles, which causes the muscles to separate.  Obesity. Excess fat puts pressure on abdominal muscles.  Weightlifting.  Some abdomen exercises.  Advanced age.  Genetics.  Prior abdominal surgery. What increases the risk? This condition is more likely to develop in:  Women.  Newborns, especially newborns who are born early (prematurely). What are the signs or symptoms? Common symptoms of this condition include:  A bulge in the middle of the abdomen. You will notice it most when you sit up or strain.  Pain in the low back, pelvis, or hips.  Constipation.  Inability to control when you urinate (urinary incontinence).  Bloating.  Poor posture. How is this diagnosed? This condition is diagnosed with a physical exam. Your health care provider will ask you to lie flat on your back and do a crunch or half sit-up. If you have diastasis recti, a vertical bulge will appear between your abdominal muscles in the center of your abdomen. Your health care provider will measure the gap between your muscles with one of the following:  A medical device used to measure the space between two objects (caliper).  A tape measure.  CT scan.  Ultrasound.  Finger spaces. Your health  care provider will measure the space using their fingers. How is this treated? If your muscle separation is not too large, you may not need treatment. However, if you are a woman who plans to become pregnant again, you should treat this condition before your next pregnancy. Treatment may include:  Physical therapy to strengthen and tighten your abdominal muscles.  Lifestyle changes such as weight loss and exercise.  Over-the-counter pain medicines as needed.  Surgery to correct the separation. Follow these instructions at home: Activity  Return to your normal activities as told by your health care provider. Ask your health care provider what activities are safe for you.  When lifting weights or doing exercises using your abdominal muscles or the muscles in the center of your body that give stability (core muscles), make sure you are doing your exercises and movements correctly. Proper form can help to prevent the condition from happening again. General instructions  If you are overweight, ask your health care provider for help with weight loss. Losing even a small amount of weight can help to improve your diastasis recti.  Take over-the-counter or prescription medicines only as told by your health care provider.  Do not strain. Straining can make the separation worse. Examples of straining include: ? Pushing hard to have a bowel movement, such as due to constipation. ? Lifting heavy objects, including children. ? Standing up and sitting down.  Take steps to prevent constipation: ? Drink enough fluid to keep your urine clear or pale yellow. ? Take over-the-counter or prescription medicines only as directed. ? Eat foods   that are high in fiber, such as fresh fruits and vegetables, whole grains, and beans. ? Limit foods that are high in fat and processed sugars, such as fried and sweet foods. Contact a health care provider if:  You notice a new bulge in your abdomen. Get help right  away if:  You experience severe discomfort in your abdomen.  You develop severe abdominal pain along with nausea, vomiting, or fever. Summary  Diastasis recti is when the abdomen (abdominal) muscles become thin and separate. Your abdomen will stick out because the space between your right and left abdomen muscles has widened.  The most common symptom is a bulge in your abdomen. You will notice it most when you sit up or are straining.  This condition is diagnosed during a physical exam.  If the abdomen separation is not too big, you may choose not to have treatment. Otherwise, you may need to undergo physical therapy or surgery. This information is not intended to replace advice given to you by your health care provider. Make sure you discuss any questions you have with your health care provider. Document Revised: 12/13/2016 Document Reviewed: 02/26/2016 Elsevier Patient Education  2020 [employer]  

## 2020-01-13 ENCOUNTER — Encounter: Payer: Self-pay | Admitting: Cardiology

## 2020-01-13 ENCOUNTER — Other Ambulatory Visit: Payer: Self-pay

## 2020-01-13 ENCOUNTER — Ambulatory Visit: Payer: Medicare (Managed Care) | Admitting: Cardiology

## 2020-01-13 VITALS — BP 108/60 | HR 56 | Ht 68.0 in | Wt 239.2 lb

## 2020-01-13 DIAGNOSIS — Z01812 Encounter for preprocedural laboratory examination: Secondary | ICD-10-CM | POA: Diagnosis not present

## 2020-01-13 DIAGNOSIS — I4819 Other persistent atrial fibrillation: Secondary | ICD-10-CM

## 2020-01-13 NOTE — Progress Notes (Signed)
Electrophysiology Office Note   Date:  01/13/2020   ID:  Raymond Christensen, DOB Sep 17, 1940, MRN [id number]  PCP:  [name], DO  Cardiologist:  Tamala Julian Primary Electrophysiologist:  Kieu Quiggle Meredith [name], MD    Chief Complaint: AF   History of Present Illness: Raymond Christensen is a 79 y.o. male who is being seen today for the evaluation of AF at the request of [name]*. Presenting today for electrophysiology evaluation.  He has a history significant for atrial fibrillation, moderate systolic heart failure, severe mitral regurgitation, coronary artery disease status post CABG.  In 1994 he had an MI complicated by cardiac arrest and received lytics.  He had a CABG in 2001x4 with postoperative atrial fibrillation.  He has carotid stenosis and is status post left CEA.  He also has diabetes, hypertension, obesity, OSA on CPAP.  He had a cardioversion 11/08/2019.  After cardioversion he had a dramatic improvement in his exercise tolerance.  He is now status post mitral clip implant 12/24/2019.  He has been loaded on amiodarone, but unfortunately did not return to atrial fibrillation.  He has symptoms of weakness and fatigue.  Today, denies symptoms of palpitations, chest pain, shortness of breath, orthopnea, PND, lower extremity edema, claudication, dizziness, presyncope, syncope, bleeding, or neurologic sequela. The patient is tolerating medications without difficulties.  Since his mitral valve clip, he has continued to have episodes of fatigue and weakness.  Unfortunately during his procedure, he had an air embolism to the coronary artery which resulted in a bradycardic arrest.  He has fully recovered.  He also had hematuria post procedure and wants to avoid a Foley catheter if at all possible.   Past Medical History:  Diagnosis Date  . Arthritis   . CAD (coronary artery disease)   . Carotid artery disease ([place])    s/p left CEA 01/17/14  . Chronic systolic CHF  (congestive heart failure) ([place])   . Concussion Summer 2012  . Essential hypertension 02/26/2017  . Heart disease   . Hyperlipidemia   . Hypothyroidism 10/15/2017  . Major depressive disorder   . Mitral valve regurgitation   . Myocardial infarction ([place]) 1996, 2021  . Obstructive sleep apnea on CPAP   . Persistent atrial fibrillation ([place])   . Poor flexibility of tendon 12/01/2018  . 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 Burt Knack  . Subungual hematoma of digit of hand 12/01/2018  . Subungual hematoma of right foot 12/01/2018  . Thyroid disease   . Type 2 diabetes mellitus without complication, without long-term current use of insulin ([place]) 02/26/2017   Past Surgical History:  Procedure Laterality Date  . BYPASS GRAFT  2001  . CARDIAC CATHETERIZATION    . CARDIOVERSION N/A 11/08/2019   Procedure: CARDIOVERSION;  Surgeon: [name], MD;  Location: [facility] ENDOSCOPY;  Service: Cardiovascular;  Laterality: N/A;  . CAROTID ENDARTERECTOMY Left 01/17/2014  . CORONARY ANGIOPLASTY    . CORONARY ARTERY BYPASS GRAFT    . MITRAL VALVE REPAIR N/A 12/23/2019   Procedure: MITRAL VALVE REPAIR;  Surgeon: [name], MD;  Location: [place] CV LAB;  Service: Cardiovascular;  Laterality: N/A;  . RIGHT/LEFT HEART CATH AND CORONARY/GRAFT ANGIOGRAPHY N/A 09/09/2019   Procedure: RIGHT/LEFT HEART CATH AND CORONARY/GRAFT ANGIOGRAPHY;  Surgeon: [name], MD;  Location: [place] CV LAB;  Service: Cardiovascular;  Laterality: N/A;  . TEE WITHOUT CARDIOVERSION N/A 09/13/2019   Procedure: TRANSESOPHAGEAL ECHOCARDIOGRAM (TEE);  Surgeon:  [name], MD;  Location[facility] ENDOSCOPY;  Service: Cardiovascular;  Laterality: N/A;  . TEE WITHOUT CARDIOVERSION N/A 12/23/2019   Procedure: TRANSESOPHAGEAL ECHOCARDIOGRAM (TEE);  Surgeon: [name], MD;  Location: [place] CV LAB;  Service: Cardiovascular;  Laterality: N/A;  . TOTAL HIP  ARTHROPLASTY  1994, 1995     Current Outpatient Medications  Medication Sig Dispense Refill  . acetaminophen (TYLENOL) 500 MG tablet Take 1,000 mg by mouth every 6 (six) hours as needed for moderate pain or headache.    Marland Kitchen amiodarone (PACERONE) 200 MG tablet Take 1 tablet (200 mg total) by mouth daily.    Marland Kitchen amoxicillin (AMOXIL) 500 MG tablet Take 4 tablets (2,000 mg total) one hour prior to all dental visits. 8 tablet 11  . apixaban (ELIQUIS) 5 MG TABS tablet Take 1 tablet (5 mg total) by mouth 2 (two) times daily. 180 tablet 3  . atorvastatin (LIPITOR) 80 MG tablet Take 1 tablet (80 mg total) by mouth daily. 90 tablet 1  . carvedilol (COREG) 6.25 MG tablet Take 1 tablet (6.25 mg total) by mouth 2 (two) times daily with a meal. 180 tablet 3  . Cholecalciferol (VITAMIN D3) 250 MCG (10000 UT) capsule Take 10,000 Units by mouth daily.     . clopidogrel (PLAVIX) 75 MG tablet Take 1 tablet (75 mg total) by mouth daily with breakfast. 30 tablet 3  . ezetimibe (ZETIA) 10 MG tablet Take 1 tablet (10 mg total) by mouth daily. 90 tablet 1  . famotidine (PEPCID) 20 MG tablet Take 1 tablet (20 mg total) by mouth daily. 30 tablet 3  . finasteride (PROSCAR) 5 MG tablet TAKE 1 TABLET DAILY 90 tablet 1  . furosemide (LASIX) 40 MG tablet Take 1 tablet (40 mg total) by mouth daily. 90 tablet 3  . levocetirizine (XYZAL) 5 MG tablet TAKE 1 TABLET EVERY EVENING 90 tablet 1  . levothyroxine (SYNTHROID) 75 MCG tablet Take 1 tablet (75 mcg total) by mouth daily. 90 tablet 1  . liothyronine (CYTOMEL) 5 MCG tablet TAKE 2 TABLETS DAILY 180 tablet 3  . Multiple Vitamins-Minerals (MULTIVITAMIN ADULT EXTRA C PO) Take 1 tablet by mouth daily.     . Multiple Vitamins-Minerals (ZINC PO) Take 1 tablet by mouth daily at 12 noon.    . nitroGLYCERIN (NITROSTAT) 0.4 MG SL tablet Place 1 tablet (0.4 mg total) under the tongue every 5 (five) minutes as needed for chest pain. 30 tablet 12  . omega-3 acid ethyl esters (LOVAZA) 1 g  capsule TAKE 2 CAPSULES TWICE DAILY 360 capsule 2  . potassium chloride (KLOR-CON 10) 10 MEQ tablet Take 2 tablets (20 mEq total) by mouth daily as needed (with every 40mg  dose of Lasix). 90 tablet 1  . sacubitril-valsartan (ENTRESTO) 24-26 MG Take 1 tablet by mouth 2 (two) times daily. 180 tablet 3  . spironolactone (ALDACTONE) 25 MG tablet Take 0.5 tablets (12.5 mg total) by mouth daily. 135 tablet 3  . tamsulosin (FLOMAX) 0.4 MG CAPS capsule TAKE 1 CAPSULE DAILY 90 capsule 3  . vitamin B-12 (CYANOCOBALAMIN) 1000 MCG tablet Take 1,000 mcg by mouth daily.     No current facility-administered medications for this visit.    Allergies:   Sulfa antibiotics   Social History:  The patient  reports that he has quit smoking. His smoking use included cigarettes. He has never used smokeless tobacco. He reports current alcohol use of about 3.0 standard drinks of alcohol per week. He reports that he does not use drugs.  Family History:  The patient's family history includes Dementia in his father.   ROS:  Please see the history of present illness.   Otherwise, review of systems is positive for none.   All other systems are reviewed and negative.   PHYSICAL EXAM: VS:  BP 108/60   Pulse (!) 56   Ht 5\' 8"  (1.727 m)   Wt 239 lb 3.2 oz (108.5 kg)   SpO2 98%   BMI 36.37 kg/m  , BMI Body mass index is 36.37 kg/m. GEN: Well nourished, well developed, in no acute distress  HEENT: normal  Neck: no JVD, carotid bruits, or masses Cardiac: RRR; no murmurs, rubs, or gallops,no edema  Respiratory:  clear to auscultation bilaterally, normal work of breathing GI: soft, nontender, nondistended, + BS MS: no deformity or atrophy  Skin: warm and dry Neuro:  Strength and sensation are intact Psych: euthymic mood, full affect  EKG:  EKG is ordered today. Personal review of the ekg ordered shows atrial fibrillation, rate 56  Recent Labs: 09/11/2019: Magnesium 1.8 09/30/2019: TSH 2.990 12/21/2019: ALT 43; B  Natriuretic Peptide 151.2 12/24/2019: BUN 20; Creatinine, Ser 1.28; Potassium 4.0; Sodium 135 12/30/2019: Hemoglobin 13.7; Platelets 162    Lipid Panel     Component Value Date/Time   CHOL 135 09/08/2019 1254   TRIG 85 09/08/2019 1254   HDL 42 09/08/2019 1254   CHOLHDL 3.2 09/08/2019 1254   VLDL 17 09/08/2019 1254   LDLCALC 76 09/08/2019 1254   LDLDIRECT 76.0 06/22/2019 1503     Wt Readings from Last 3 Encounters:  01/13/20 239 lb 3.2 oz (108.5 kg)  01/10/20 239 lb 6.4 oz (108.6 kg)  12/30/19 233 lb 12.8 oz (106.1 kg)      Other studies Reviewed: Additional studies/ records that were reviewed today include: TEE 12/25/2019 Review of the above records today demonstrates:  1. Day 1 post Mitraclip placement, LVEF 40-45%, mitral regurgitation  mild, mean transmitral gradient 2 mmHg.  2. Left ventricular ejection fraction, by estimation, is 40 to 45%. The  left ventricle has mildly decreased function. The left ventricle  demonstrates global hypokinesis. Left ventricular diastolic function could  not be evaluated.  3. Right ventricular systolic function is mildly reduced. The right  ventricular size is normal.  4. Left atrial size was severely dilated.  5. Right atrial size was moderately dilated.  6. The mitral valve is normal in structure. Mild mitral valve  regurgitation. No evidence of mitral stenosis. The mean mitral valve  gradient is 2.0 mmHg with average heart rate of 61 bpm. There is a XTW  MitralClip present in the mitral position.  Procedure Date: 12/23/2019.  7. The aortic valve is normal in structure. Aortic valve regurgitation is  not visualized. No aortic stenosis is present.  8. The inferior vena cava is normal in size with greater than 50%  respiratory variability, suggesting right atrial pressure of 3 mmHg.    RHC/LHC 09/10/19  Mid LM to Dist LM lesion is 70% stenosed.  1st Mrg lesion is 75% stenosed.  2nd Mrg-1 lesion is 90% stenosed.  2nd  Mrg-2 lesion is 95% stenosed.  Mid RCA to Dist RCA lesion is 100% stenosed.  Prox RCA to Mid RCA lesion is 90% stenosed.  2nd Diag lesion is 75% stenosed.  Origin to Prox Graft lesion between 2nd Mrg and RPDA is 99% stenosed.  Prox Graft to Mid Graft lesion between Ramus and 2nd Mrg is 75% stenosed.  A drug-eluting stent was successfully placed  using a STENT RESOLUTE ONYX 3.0X18.  Post intervention, there is a 0% residual stenosis.  A drug-eluting stent was successfully placed.  Post intervention, there is a 0% residual stenosis.  Hemodynamic findings consistent with mitral valve regurgitation.   ASSESSMENT AND PLAN:  1.  Persistent atrial fibrillation: Currently on amiodarone, high risk medication monitoring, Eliquis, Coreg.  Is status post mitral valve clip on 12/23/2019 for mitral regurgitation.  He remains in atrial fibrillation and has symptoms of weakness and fatigue.  Due to that, we Ramonica Grigg plan for ablation.  Risks and benefits of been discussed and include bleeding, tamponade, heart block, stroke, damage to chest organs.  He understands these risks and has agreed to the procedure.  Of note, he had significant hematuria after his Foley catheterization during his mitral valve clip.  He would like to avoid catheterization if at all possible.  2.  Coronary artery disease status post CABG: No current chest pain.  3.  Hypertension: Currently well controlled  4.  Chronic systolic heart failure due to ischemic cardiomyopathy: Currently on carvedilol, Aldactone, Entresto.  No obvious volume overload.  Plan per primary cardiology.  5.  Obstructive sleep apnea: CPAP compliance encouraged  6.  Severe mitral regurgitation: Status post mitral valve clip 12/23/2019.  Plan per primary cardiology.  Case discussed with primary cardiology  Current medicines are reviewed at length with the patient today.   The patient does not have concerns regarding his medicines.  The following changes  were made today:  none  Labs/ tests ordered today include:  Orders Placed This Encounter  Procedures  . CBC  . Basic metabolic panel  . EKG 12-Lead     Disposition:   FU with Robertta Halfhill 3 months  Signed, Nakira Litzau Meredith [name], MD  01/13/2020 10:06 AM     [facility] [address] [place] [address] [phone] (office) [phone] (fax)

## 2020-01-13 NOTE — Patient Instructions (Signed)
Medication Instructions:  Your physician recommends that you continue on your current medications as directed. Please refer to the Current Medication list given to you today.  *If you need a refill on your cardiac medications before your next appointment, please call your pharmacy*   Lab Work: Pre procedure labs today: BMET & CBC If you have labs (blood work) drawn today and your tests are completely normal, you will receive your results only by:  [place] (if you have MyChart) OR  A paper copy in the mail If you have any lab test that is abnormal or we need to change your treatment, we will call you to review the results.   Testing/Procedures: None ordered   Follow-Up: At [facility], you and your health needs are our priority.  As part of our continuing mission to provide you with exceptional heart care, we have created designated Provider Care Teams.  These Care Teams include your primary Cardiologist (physician) and Advanced Practice Providers (APPs -  Physician Assistants and Nurse Practitioners) who all work together to provide you with the care you need, when you need it.  Your next appointment:   4 week(s) after your ablation  The format for your next appointment:   In Person  Provider:   You will follow up in the [facility] located at [facility]. Your provider will be: Roderic Palau, NP or Clint R. Fenton, PA-C    Thank you for choosing [facility]!!   Trinidad Curet, RN [phone]   Other Instructions  CT INSTRUCTIONS Your cardiac CT will be scheduled at:  [facility] [address] [place], [place] [address] [phone]  Please arrive at the [facility] main entrance of [facility] 30 minutes prior to test start time. Proceed to the [facility] Radiology Department (first floor) to check-in and test prep.  Please follow these instructions carefully (unless otherwise directed):  Hold all  erectile dysfunction medications at least 3 days (72 hrs) prior to test.  On the Night Before the Test:  Be sure to Drink plenty of water.  Do not consume any caffeinated/decaffeinated beverages or chocolate 12 hours prior to your test.  Do not take any antihistamines 12 hours prior to your test.  On the Day of the Test:  Drink plenty of water. Do not drink any water within one hour of the test.  Do not eat any food 4 hours prior to the test.  You may take your regular medications prior to the test.   HOLD Furosemide/Hydrochlorothiazide morning of the test.      After the Test:  Drink plenty of water.  After receiving IV contrast, you may experience a mild flushed feeling. This is normal.  On occasion, you may experience a mild rash up to 24 hours after the test. This is not dangerous. If this occurs, you can take Benadryl 25 mg and increase your fluid intake.  If you experience trouble breathing, this can be serious. If it is severe call 911 IMMEDIATELY. If it is mild, please call our office.  If you take any of these medications: Glipizide/Metformin, Avandament, Glucavance, please do not take 48 hours after completing test unless otherwise instructed.  For non-scheduling related questions, please contact the cardiac imaging nurse navigator should you have any questions/concerns: Marchia Bond, Cardiac Imaging Nurse Navigator Burley Saver, Interim Cardiac Imaging Nurse [place] and V[facility] Services Direct Office Dial: [phone]   For scheduling needs, including cancellations and  rescheduling, please call [place], [phone].     Electrophysiology/Ablation Procedure Instructions   You are scheduled for a(n)  ablation on 02/01/20 with Dr. Allegra Lai.   1.   Pre procedure testing-             A.  LAB WORK --- On 01/13/20 for your pre procedure blood work.  You do NOT need to be fasting.               B. COVID TEST-- On 01/29/20 @ 10:00 am - This is  a Drive Up Visit at [address] [place], [place] [address].  Someone will direct you to the appropriate testing line. Stay in your car and someone will be with you shortly.   After you are tested please go home and self quarantine until the day of your procedure.     2. On the day of your procedure 02/01/20 you will go to [facility] hospital ([address]) at 5:30 am.  Dennis Bast will go to the main entrance A [employer]) and enter where the [employer] are.  Your driver will drop you off and you will head down the hallway to ADMITTING.  You may have one support person come in to the hospital with you.  They will be asked to wait in the waiting room.   3.   Do not eat or drink after midnight prior to your procedure.   4.   Do not miss any doses of your blood thinner prior to the morning of your procedure or your procedure will need to be rescheduled.       Do NOT take any medications the morning of your procedure.   5.  Plan for an overnight stay, but you may be discharged home after your procedure.    If you use your phone frequently bring your phone charger, in case you have to stay.  If you are discharged after your procedure you will need someone to drive you home and be with your for 24 hours after your procedure.   6. You will follow up with the AFIB clinic 4 weeks after your procedure.  You will follow up with Dr. Curt Bears  3 months after your procedure.  These appointments will be made for you.   * If you have ANY questions please call the office [phone] and ask for Nasir Bright RN or send me a MyChart message   * Occasionally, EP Studies and ablations can become lengthy.  Please make your family aware of this before your procedure starts.  Average time ranges from 2-8 hours for EP studies/ablations.  Your physician will call your family after the procedure with the results.

## 2020-01-13 NOTE — H&P (View-Only) (Signed)
Electrophysiology Office Note   Date:  01/13/2020   ID:  Raymond Christensen, DOB 12/05/1940, MRN [id number]  PCP:  [name], DO  Cardiologist:  Tamala Julian Primary Electrophysiologist:  Tyann Niehaus Meredith [name], MD    Chief Complaint: AF   History of Present Illness: Raymond Christensen is a 79 y.o. male who is being seen today for the evaluation of AF at the request of [name]*. Presenting today for electrophysiology evaluation.  He has a history significant for atrial fibrillation, moderate systolic heart failure, severe mitral regurgitation, coronary artery disease status post CABG.  In 1994 he had an MI complicated by cardiac arrest and received lytics.  He had a CABG in 2001x4 with postoperative atrial fibrillation.  He has carotid stenosis and is status post left CEA.  He also has diabetes, hypertension, obesity, OSA on CPAP.  He had a cardioversion 11/08/2019.  After cardioversion he had a dramatic improvement in his exercise tolerance.  He is now status post mitral clip implant 12/24/2019.  He has been loaded on amiodarone, but unfortunately did not return to atrial fibrillation.  He has symptoms of weakness and fatigue.  Today, denies symptoms of palpitations, chest pain, shortness of breath, orthopnea, PND, lower extremity edema, claudication, dizziness, presyncope, syncope, bleeding, or neurologic sequela. The patient is tolerating medications without difficulties.  Since his mitral valve clip, he has continued to have episodes of fatigue and weakness.  Unfortunately during his procedure, he had an air embolism to the coronary artery which resulted in a bradycardic arrest.  He has fully recovered.  He also had hematuria post procedure and wants to avoid a Foley catheter if at all possible.   Past Medical History:  Diagnosis Date  . Arthritis   . CAD (coronary artery disease)   . Carotid artery disease ([place])    s/p left CEA 01/17/14  . Chronic systolic CHF  (congestive heart failure) ([place])   . Concussion Summer 2012  . Essential hypertension 02/26/2017  . Heart disease   . Hyperlipidemia   . Hypothyroidism 10/15/2017  . Major depressive disorder   . Mitral valve regurgitation   . Myocardial infarction ([place]) 1996, 2021  . Obstructive sleep apnea on CPAP   . Persistent atrial fibrillation ([place])   . Poor flexibility of tendon 12/01/2018  . 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 Burt Knack  . Subungual hematoma of digit of hand 12/01/2018  . Subungual hematoma of right foot 12/01/2018  . Thyroid disease   . Type 2 diabetes mellitus without complication, without long-term current use of insulin ([place]) 02/26/2017   Past Surgical History:  Procedure Laterality Date  . BYPASS GRAFT  2001  . CARDIAC CATHETERIZATION    . CARDIOVERSION N/A 11/08/2019   Procedure: CARDIOVERSION;  Surgeon: [name], MD;  Location: [facility] ENDOSCOPY;  Service: Cardiovascular;  Laterality: N/A;  . CAROTID ENDARTERECTOMY Left 01/17/2014  . CORONARY ANGIOPLASTY    . CORONARY ARTERY BYPASS GRAFT    . MITRAL VALVE REPAIR N/A 12/23/2019   Procedure: MITRAL VALVE REPAIR;  Surgeon: [name], MD;  Location: [place] CV LAB;  Service: Cardiovascular;  Laterality: N/A;  . RIGHT/LEFT HEART CATH AND CORONARY/GRAFT ANGIOGRAPHY N/A 09/09/2019   Procedure: RIGHT/LEFT HEART CATH AND CORONARY/GRAFT ANGIOGRAPHY;  Surgeon: [name], MD;  Location: [place] CV LAB;  Service: Cardiovascular;  Laterality: N/A;  . TEE WITHOUT CARDIOVERSION N/A 09/13/2019   Procedure: TRANSESOPHAGEAL ECHOCARDIOGRAM (TEE);  Surgeon:  [name], MD;  Location: [facility] ENDOSCOPY;  Service: Cardiovascular;  Laterality: N/A;  . TEE WITHOUT CARDIOVERSION N/A 12/23/2019   Procedure: TRANSESOPHAGEAL ECHOCARDIOGRAM (TEE);  Surgeon: [name], MD;  Location: [place] CV LAB;  Service: Cardiovascular;  Laterality: N/A;  . TOTAL HIP  ARTHROPLASTY  1994, 1995     Current Outpatient Medications  Medication Sig Dispense Refill  . acetaminophen (TYLENOL) 500 MG tablet Take 1,000 mg by mouth every 6 (six) hours as needed for moderate pain or headache.    Marland Kitchen amiodarone (PACERONE) 200 MG tablet Take 1 tablet (200 mg total) by mouth daily.    Marland Kitchen amoxicillin (AMOXIL) 500 MG tablet Take 4 tablets (2,000 mg total) one hour prior to all dental visits. 8 tablet 11  . apixaban (ELIQUIS) 5 MG TABS tablet Take 1 tablet (5 mg total) by mouth 2 (two) times daily. 180 tablet 3  . atorvastatin (LIPITOR) 80 MG tablet Take 1 tablet (80 mg total) by mouth daily. 90 tablet 1  . carvedilol (COREG) 6.25 MG tablet Take 1 tablet (6.25 mg total) by mouth 2 (two) times daily with a meal. 180 tablet 3  . Cholecalciferol (VITAMIN D3) 250 MCG (10000 UT) capsule Take 10,000 Units by mouth daily.     . clopidogrel (PLAVIX) 75 MG tablet Take 1 tablet (75 mg total) by mouth daily with breakfast. 30 tablet 3  . ezetimibe (ZETIA) 10 MG tablet Take 1 tablet (10 mg total) by mouth daily. 90 tablet 1  . famotidine (PEPCID) 20 MG tablet Take 1 tablet (20 mg total) by mouth daily. 30 tablet 3  . finasteride (PROSCAR) 5 MG tablet TAKE 1 TABLET DAILY 90 tablet 1  . furosemide (LASIX) 40 MG tablet Take 1 tablet (40 mg total) by mouth daily. 90 tablet 3  . levocetirizine (XYZAL) 5 MG tablet TAKE 1 TABLET EVERY EVENING 90 tablet 1  . levothyroxine (SYNTHROID) 75 MCG tablet Take 1 tablet (75 mcg total) by mouth daily. 90 tablet 1  . liothyronine (CYTOMEL) 5 MCG tablet TAKE 2 TABLETS DAILY 180 tablet 3  . Multiple Vitamins-Minerals (MULTIVITAMIN ADULT EXTRA C PO) Take 1 tablet by mouth daily.     . Multiple Vitamins-Minerals (ZINC PO) Take 1 tablet by mouth daily at 12 noon.    . nitroGLYCERIN (NITROSTAT) 0.4 MG SL tablet Place 1 tablet (0.4 mg total) under the tongue every 5 (five) minutes as needed for chest pain. 30 tablet 12  . omega-3 acid ethyl esters (LOVAZA) 1 g  capsule TAKE 2 CAPSULES TWICE DAILY 360 capsule 2  . potassium chloride (KLOR-CON 10) 10 MEQ tablet Take 2 tablets (20 mEq total) by mouth daily as needed (with every 40mg  dose of Lasix). 90 tablet 1  . sacubitril-valsartan (ENTRESTO) 24-26 MG Take 1 tablet by mouth 2 (two) times daily. 180 tablet 3  . spironolactone (ALDACTONE) 25 MG tablet Take 0.5 tablets (12.5 mg total) by mouth daily. 135 tablet 3  . tamsulosin (FLOMAX) 0.4 MG CAPS capsule TAKE 1 CAPSULE DAILY 90 capsule 3  . vitamin B-12 (CYANOCOBALAMIN) 1000 MCG tablet Take 1,000 mcg by mouth daily.     No current facility-administered medications for this visit.    Allergies:   Sulfa antibiotics   Social History:  The patient  reports that he has quit smoking. His smoking use included cigarettes. He has never used smokeless tobacco. He reports current alcohol use of about 3.0 standard drinks of alcohol per week. He reports that he does not use drugs.  Family History:  The patient's family history includes Dementia in his father.   ROS:  Please see the history of present illness.   Otherwise, review of systems is positive for none.   All other systems are reviewed and negative.   PHYSICAL EXAM: VS:  BP 108/60   Pulse (!) 56   Ht 5\' 8"  (1.727 m)   Wt 239 lb 3.2 oz (108.5 kg)   SpO2 98%   BMI 36.37 kg/m  , BMI Body mass index is 36.37 kg/m. GEN: Well nourished, well developed, in no acute distress  HEENT: normal  Neck: no JVD, carotid bruits, or masses Cardiac: RRR; no murmurs, rubs, or gallops,no edema  Respiratory:  clear to auscultation bilaterally, normal work of breathing GI: soft, nontender, nondistended, + BS MS: no deformity or atrophy  Skin: warm and dry Neuro:  Strength and sensation are intact Psych: euthymic mood, full affect  EKG:  EKG is ordered today. Personal review of the ekg ordered shows atrial fibrillation, rate 56  Recent Labs: 09/11/2019: Magnesium 1.8 09/30/2019: TSH 2.990 12/21/2019: ALT 43; B  Natriuretic Peptide 151.2 12/24/2019: BUN 20; Creatinine, Ser 1.28; Potassium 4.0; Sodium 135 12/30/2019: Hemoglobin 13.7; Platelets 162    Lipid Panel     Component Value Date/Time   CHOL 135 09/08/2019 1254   TRIG 85 09/08/2019 1254   HDL 42 09/08/2019 1254   CHOLHDL 3.2 09/08/2019 1254   VLDL 17 09/08/2019 1254   LDLCALC 76 09/08/2019 1254   LDLDIRECT 76.0 06/22/2019 1503     Wt Readings from Last 3 Encounters:  01/13/20 239 lb 3.2 oz (108.5 kg)  01/10/20 239 lb 6.4 oz (108.6 kg)  12/30/19 233 lb 12.8 oz (106.1 kg)      Other studies Reviewed: Additional studies/ records that were reviewed today include: TEE 12/25/2019 Review of the above records today demonstrates:  1. Day 1 post Mitraclip placement, LVEF 40-45%, mitral regurgitation  mild, mean transmitral gradient 2 mmHg.  2. Left ventricular ejection fraction, by estimation, is 40 to 45%. The  left ventricle has mildly decreased function. The left ventricle  demonstrates global hypokinesis. Left ventricular diastolic function could  not be evaluated.  3. Right ventricular systolic function is mildly reduced. The right  ventricular size is normal.  4. Left atrial size was severely dilated.  5. Right atrial size was moderately dilated.  6. The mitral valve is normal in structure. Mild mitral valve  regurgitation. No evidence of mitral stenosis. The mean mitral valve  gradient is 2.0 mmHg with average heart rate of 61 bpm. There is a XTW  MitralClip present in the mitral position.  Procedure Date: 12/23/2019.  7. The aortic valve is normal in structure. Aortic valve regurgitation is  not visualized. No aortic stenosis is present.  8. The inferior vena cava is normal in size with greater than 50%  respiratory variability, suggesting right atrial pressure of 3 mmHg.    RHC/LHC 09/10/19  Mid LM to Dist LM lesion is 70% stenosed.  1st Mrg lesion is 75% stenosed.  2nd Mrg-1 lesion is 90% stenosed.  2nd  Mrg-2 lesion is 95% stenosed.  Mid RCA to Dist RCA lesion is 100% stenosed.  Prox RCA to Mid RCA lesion is 90% stenosed.  2nd Diag lesion is 75% stenosed.  Origin to Prox Graft lesion between 2nd Mrg and RPDA is 99% stenosed.  Prox Graft to Mid Graft lesion between Ramus and 2nd Mrg is 75% stenosed.  A drug-eluting stent was successfully placed  using a STENT RESOLUTE ONYX 3.0X18.  Post intervention, there is a 0% residual stenosis.  A drug-eluting stent was successfully placed.  Post intervention, there is a 0% residual stenosis.  Hemodynamic findings consistent with mitral valve regurgitation.   ASSESSMENT AND PLAN:  1.  Persistent atrial fibrillation: Currently on amiodarone, high risk medication monitoring, Eliquis, Coreg.  Is status post mitral valve clip on 12/23/2019 for mitral regurgitation.  He remains in atrial fibrillation and has symptoms of weakness and fatigue.  Due to that, we Lamont Tant plan for ablation.  Risks and benefits of been discussed and include bleeding, tamponade, heart block, stroke, damage to chest organs.  He understands these risks and has agreed to the procedure.  Of note, he had significant hematuria after his Foley catheterization during his mitral valve clip.  He would like to avoid catheterization if at all possible.  2.  Coronary artery disease status post CABG: No current chest pain.  3.  Hypertension: Currently well controlled  4.  Chronic systolic heart failure due to ischemic cardiomyopathy: Currently on carvedilol, Aldactone, Entresto.  No obvious volume overload.  Plan per primary cardiology.  5.  Obstructive sleep apnea: CPAP compliance encouraged  6.  Severe mitral regurgitation: Status post mitral valve clip 12/23/2019.  Plan per primary cardiology.  Case discussed with primary cardiology  Current medicines are reviewed at length with the patient today.   The patient does not have concerns regarding his medicines.  The following changes  were made today:  none  Labs/ tests ordered today include:  Orders Placed This Encounter  Procedures  . CBC  . Basic metabolic panel  . EKG 12-Lead     Disposition:   FU with Levin Dagostino 3 months  Signed, Nesta Kimple Meredith [name], MD  01/13/2020 10:06 AM     [facility] [address] [place] [address] [phone] (office) [phone] (fax)

## 2020-01-14 LAB — BASIC METABOLIC PANEL
BUN/Creatinine Ratio: 15 (ref 10–24)
BUN: 23 mg/dL (ref 8–27)
CO2: 26 mmol/L (ref 20–29)
Calcium: 9.6 mg/dL (ref 8.6–10.2)
Chloride: 103 mmol/L (ref 96–106)
Creatinine, Ser: 1.55 mg/dL — ABNORMAL HIGH (ref 0.76–1.27)
GFR calc Af Amer: 49 mL/min/{1.73_m2} — ABNORMAL LOW (ref >59)
GFR calc non Af Amer: 42 mL/min/{1.73_m2} — ABNORMAL LOW (ref >59)
Glucose: 102 mg/dL — ABNORMAL HIGH (ref 65–99)
Potassium: 5 mmol/L (ref 3.5–5.2)
Sodium: 142 mmol/L (ref 134–144)

## 2020-01-14 LAB — CBC
Hematocrit: 42.4 % (ref 37.5–51.0)
Hemoglobin: 13.8 g/dL (ref 13.0–17.7)
MCH: 28.9 pg (ref 26.6–33.0)
MCHC: 32.5 g/dL (ref 31.5–35.7)
MCV: 89 fL (ref 79–97)
Platelets: 148 10*3/uL — ABNORMAL LOW (ref 150–450)
RBC: 4.78 x10E6/uL (ref 4.14–5.80)
RDW: 13.9 % (ref 11.6–15.4)
WBC: 7.9 10*3/uL (ref 3.4–10.8)

## 2020-01-17 ENCOUNTER — Ambulatory Visit: Payer: Medicare (Managed Care) | Admitting: Family Medicine

## 2020-01-17 ENCOUNTER — Other Ambulatory Visit: Payer: Self-pay

## 2020-01-17 ENCOUNTER — Encounter: Payer: Self-pay | Admitting: Family Medicine

## 2020-01-17 ENCOUNTER — Ambulatory Visit (HOSPITAL_BASED_OUTPATIENT_CLINIC_OR_DEPARTMENT_OTHER)
Admission: RE | Admit: 2020-01-17 | Discharge: 2020-01-17 | Disposition: A | Discharge status: Home / Self Care | Payer: Medicare (Managed Care) | Source: Ambulatory Visit | Attending: Family Medicine | Admitting: Family Medicine

## 2020-01-17 VITALS — BP 100/70 | HR 68 | Temp 97.8°F | Resp 16 | Ht 68.0 in | Wt 238.0 lb

## 2020-01-17 DIAGNOSIS — M79675 Pain in left toe(s): Secondary | ICD-10-CM | POA: Diagnosis not present

## 2020-01-17 DIAGNOSIS — S91109A Unspecified open wound of unspecified toe(s) without damage to nail, initial encounter: Secondary | ICD-10-CM

## 2020-01-17 MED ORDER — TRAMADOL HCL 50 MG PO TABS: 25.0000 mg | ORAL_TABLET | Freq: Two times a day (BID) | ORAL | 0 refills | Status: AC | PRN

## 2020-01-17 NOTE — Progress Notes (Signed)
Musculoskeletal Exam  Patient: Raymond Christensen DOB: 09/07/1940  DOS: 01/17/2020  SUBJECTIVE:  Chief Complaint:   Chief Complaint  Patient presents with  . Toe Injury    Pt states not sure how  injury occurred. Pt states it has been a least 2 weeks, Pt states having a lot of pain. No swelling.     Raymond Christensen is a 80 y.o.  male for evaluation and treatment of R 5th toe pain. Here w his spouse Okey Regal.   Onset:  2 weeks ago. No inj or change in activity.  Location:  Character:  aching  Progression of issue:  is unchanged Associated symptoms: looks like dried blood on the inside; no redness, swelling, bruising Treatment: to date has been TAO ointment daily and bandaging.   Neurovascular symptoms: no He is a diabetic.   Past Medical History:  Diagnosis Date  . Arthritis   . CAD (coronary artery disease)   . Carotid artery disease ([facility])    s/p left CEA 01/17/14  . Chronic systolic CHF (congestive heart failure) ([facility])   . Concussion Summer 2012  . Essential hypertension 02/26/2017  . Heart disease   . Hyperlipidemia   . Hypothyroidism 10/15/2017  . Major depressive disorder   . Mitral valve regurgitation   . Myocardial infarction ([facility]) 1996, 2021  . Obstructive sleep apnea on CPAP   . Persistent atrial fibrillation ([facility])   . Poor flexibility of tendon 12/01/2018  . 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 Excell Seltzer  . Subungual hematoma of digit of hand 12/01/2018  . Subungual hematoma of right foot 12/01/2018  . Thyroid disease   . Type 2 diabetes mellitus without complication, without long-term current use of insulin ([facility]) 02/26/2017    Objective: VITAL SIGNS: BP 100/70 (BP Location: Left Arm, Patient Position: Sitting, Cuff Size: Large)   Pulse 68   Temp 97.8 F (36.6 C) (Oral)   Resp 16   Ht 5\' 8"  (1.727 m)   Wt 238 lb (108 kg)   SpO2 98%   BMI 36.19 kg/m  Constitutional: Well formed, well developed. No acute  distress. Thorax & Lungs: No accessory muscle use Musculoskeletal: L 5th digit.   Tenderness to palpation: yes, around DP and IP.  Deformity: no Ecchymosis: no There is a central scab/eschar with ttp and  without fluctuance, excessive warmth, or drainage.  Neurologic: Normal sensory function.  Psychiatric: Normal mood. Age appropriate judgment and insight. Alert & oriented x 3.    Assessment:  Pain of toe of left foot - Plan: DG Toe 5th Left, traMADol (ULTRAM) 50 MG tablet  Open wound of toe, initial encounter - Plan: traMADol (ULTRAM) 50 MG tablet  Plan: TAO bid until resolution, ice, Tylenol. Wound referral if no improvement by next week. XR shows no signs of fx, dislocation or osteo. Await official read. Will treat as wound.  F/u prn. The patient and his spouse voiced understanding and agreement to the plan.  Greater than 30 minutes were spent with the patient in addition to reviewing their chart information on the same day of the visit.   [place], DO 01/18/20  7:19 AM

## 2020-01-17 NOTE — Patient Instructions (Addendum)
Continue triple antibiotic ointment and bandaging the area, but do it twice daily.   OK to take Tylenol 1000 mg (2 extra strength tabs) or 975 mg (3 regular strength tabs) every 6 hours as needed.  Ice/cold pack over area for 10-15 min twice daily.  We will be in touch regarding the official X-ray read from the radiologist. I do not see anything sinister.   Keep it dressed when walking. OK to let it air out if you are relaxing.  Send me a message at the end of next week if not improving.   Let us know if you need anything or if anything changes.

## 2020-01-18 ENCOUNTER — Encounter: Payer: Self-pay | Admitting: Family Medicine

## 2020-01-20 ENCOUNTER — Other Ambulatory Visit: Payer: Self-pay | Admitting: *Deleted

## 2020-01-20 NOTE — Progress Notes (Signed)
Cardiology Office Note:    Date:  01/24/2020   ID:  Raymond Christensen, DOB [birth year], MRN [id number]  PCP:  Raymond Pal, DO  Cardiologist:  Raymond Grooms, MD   Referring MD: Raymond Christensen*   No chief complaint on file.   History of Present Illness:    Raymond Christensen is a 80 y.o. male with a hx of new AFib, elevated troponin and new reduced Ef 35-40%08/2019,and mod to severe MR, LA thrombus on TEE. Recent PCI with stent SVG to Cfx. Past history of MI in 1994 c/b cardiac arrest s/p lytics and PTCA, CABG in 2001 (LIMA to LAD, SVG to OM-2, SVG to right PDA, SVG to D-2) with post-op AF, carotid stenosis s/p L CEA and right subclavian stenosis, diet-controlled DM, HTN, obesity, OSA on CPAP, mild cognitive impairment,anddepression/mood disorder.  Says he feels tired all the time.  Despite this he is able to carry out all his duties at home.  He walked in from the parking lot.  No excessive shortness of breath.  He is not exercising because of a left toe injury.  He can walk a flight of stairs.  He is able to vacuum the floor.  He denies orthopnea and PND.  He is perfectly able to carry out all the activities of his daily life.  He does not do any heavy physical activity.  He denies angina.  He cannot tell that he has had any dysrhythmia.    Past Medical History:  Diagnosis Date  . Arthritis   . CAD (coronary artery disease)   . Carotid artery disease ([place])    s/p left CEA 01/17/14  . Chronic systolic CHF (congestive heart failure) ([place])   . Concussion Summer 2012  . Essential hypertension 02/26/2017  . Heart disease   . Hyperlipidemia   . Hypothyroidism 10/15/2017  . Major depressive disorder   . Mitral valve regurgitation   . Myocardial infarction ([place]) 1996, 2021  . Obstructive sleep apnea on CPAP   . Persistent atrial fibrillation ([place])   . Poor flexibility of tendon 12/01/2018  . S/P mitral valve clip implantation 12/23/2019   s/p TEER with MitraClip  with one XTW by Dr. Burt Knack  . Subungual hematoma of digit of hand 12/01/2018  . Subungual hematoma of right foot 12/01/2018  . Thyroid disease   . Type 2 diabetes mellitus without complication, without long-term current use of insulin ([place]) 02/26/2017    Past Surgical History:  Procedure Laterality Date  . BYPASS GRAFT  2001  . CARDIAC CATHETERIZATION    . CARDIOVERSION N/A 11/08/2019   Procedure: CARDIOVERSION;  Surgeon: [name], MD;  Location: [facility] ENDOSCOPY;  Service: Cardiovascular;  Laterality: N/A;  . CAROTID ENDARTERECTOMY Left 01/17/2014  . CORONARY ANGIOPLASTY    . CORONARY ARTERY BYPASS GRAFT    . MITRAL VALVE REPAIR N/A 12/23/2019   Procedure: MITRAL VALVE REPAIR;  Surgeon: [name], MD;  Location: [place] CV LAB;  Service: Cardiovascular;  Laterality: N/A;  . RIGHT/LEFT HEART CATH AND CORONARY/GRAFT ANGIOGRAPHY N/A 09/09/2019   Procedure: RIGHT/LEFT HEART CATH AND CORONARY/GRAFT ANGIOGRAPHY;  Surgeon: [name], MD;  Location: [place] CV LAB;  Service: Cardiovascular;  Laterality: N/A;  . TEE WITHOUT CARDIOVERSION N/A 09/13/2019   Procedure: TRANSESOPHAGEAL ECHOCARDIOGRAM (TEE);  Surgeon: [name], MD;  Location: [facility] ENDOSCOPY;  Service: Cardiovascular;  Laterality: N/A;  . TEE WITHOUT CARDIOVERSION N/A 12/23/2019   Procedure: TRANSESOPHAGEAL ECHOCARDIOGRAM (TEE);  Surgeon: [name], MD;  Location: [facility]  INVASIVE CV LAB;  Service: Cardiovascular;  Laterality: N/A;  . TOTAL HIP ARTHROPLASTY  1994, 1995    Current Medications: Current Meds  Medication Sig  . acetaminophen (TYLENOL) 500 MG tablet Take 1,000 mg by mouth every 6 (six) hours as needed for moderate pain or headache.  Marland Kitchen amiodarone (PACERONE) 200 MG tablet Take 1 tablet (200 mg total) by mouth daily.  Marland Kitchen amoxicillin (AMOXIL) 500 MG tablet Take 4 tablets (2,000 mg total) one hour prior to all dental visits.  Marland Kitchen apixaban (ELIQUIS) 5 MG TABS tablet Take 1 tablet (5 mg total) by  mouth 2 (two) times daily.  Marland Kitchen atorvastatin (LIPITOR) 80 MG tablet Take 1 tablet (80 mg total) by mouth daily.  . carvedilol (COREG) 6.25 MG tablet Take 1 tablet (6.25 mg total) by mouth 2 (two) times daily with a meal.  . Cholecalciferol (VITAMIN D3) 250 MCG (10000 UT) capsule Take 10,000 Units by mouth daily.   . clopidogrel (PLAVIX) 75 MG tablet Take 1 tablet (75 mg total) by mouth daily with breakfast.  . ezetimibe (ZETIA) 10 MG tablet Take 1 tablet (10 mg total) by mouth daily.  . famotidine (PEPCID) 20 MG tablet Take 1 tablet (20 mg total) by mouth daily.  . finasteride (PROSCAR) 5 MG tablet TAKE 1 TABLET DAILY  . furosemide (LASIX) 40 MG tablet Take 1 tablet (40 mg total) by mouth daily.  Marland Kitchen levocetirizine (XYZAL) 5 MG tablet TAKE 1 TABLET EVERY EVENING  . levothyroxine (SYNTHROID) 75 MCG tablet Take 1 tablet (75 mcg total) by mouth daily.  Marland Kitchen liothyronine (CYTOMEL) 5 MCG tablet TAKE 2 TABLETS DAILY  . Multiple Vitamins-Minerals (MULTIVITAMIN ADULT EXTRA C PO) Take 1 tablet by mouth daily.   . Multiple Vitamins-Minerals (ZINC PO) Take 1 tablet by mouth daily at 12 noon.  . nitroGLYCERIN (NITROSTAT) 0.4 MG SL tablet Place 1 tablet (0.4 mg total) under the tongue every 5 (five) minutes as needed for chest pain.  Marland Kitchen omega-3 acid ethyl esters (LOVAZA) 1 g capsule TAKE 2 CAPSULES TWICE DAILY  . potassium chloride (KLOR-CON 10) 10 MEQ tablet Take 2 tablets (20 mEq total) by mouth daily as needed (with every 40mg  dose of Lasix).  . sacubitril-valsartan (ENTRESTO) 24-26 MG Take 1 tablet by mouth 2 (two) times daily.  Marland Kitchen spironolactone (ALDACTONE) 25 MG tablet Take 0.5 tablets (12.5 mg total) by mouth daily.  . tamsulosin (FLOMAX) 0.4 MG CAPS capsule TAKE 1 CAPSULE DAILY  . vitamin B-12 (CYANOCOBALAMIN) 1000 MCG tablet Take 1,000 mcg by mouth daily.     Allergies:   Sulfa antibiotics   Social History   Socioeconomic History  . Marital status: Married    Spouse name: Not on file  . Number of  children: Not on file  . Years of education: 39  . Highest education level: Doctorate  Occupational History  . Not on file  Tobacco Use  . Smoking status: Former Smoker    Types: Cigarettes  . Smokeless tobacco: Never Used  Vaping Use  . Vaping Use: Never used  Substance and Sexual Activity  . Alcohol use: Yes    Alcohol/week: 3.0 standard drinks    Types: 3 Standard drinks or equivalent per week    Comment: 1 drink 3 times per week  . Drug use: No  . Sexual activity: Yes  Other Topics Concern  . 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 Difficulty of Paying Living Expenses: Not hard at all  Food Insecurity: Not on file  Transportation Needs: Not on file  Physical Activity: Not on file  Stress: Not on file  Social Connections: Not on file     Family History: The patient's family history includes Dementia in his father. There is no history of Cancer.  ROS:   Please see the history of present illness.    Angry, at times agitated, and perhaps depression.  His wife mentioned this as a significant change in personality.  He states he is somewhat angry because of the heart issues that have been going on.  All other systems reviewed and are negative.  EKGs/Labs/Other Studies Reviewed:    The following studies were reviewed today:  ECHOCARDIOGRAM 12/24/2019: IMPRESSIONS    1. Day 1 post Mitraclip placement, LVEF 40-45%, mitral regurgitation  mild, mean transmitral gradient 2 mmHg.  2. Left ventricular ejection fraction, by estimation, is 40 to 45%. The  left ventricle has mildly decreased function. The left ventricle  demonstrates global hypokinesis. Left ventricular diastolic function could  not be evaluated.  3. Right ventricular  systolic function is mildly reduced. The right  ventricular size is normal.  4. Left atrial size was severely dilated.  5. Right atrial size was moderately dilated.  6. The mitral valve is normal in structure. Mild mitral valve  regurgitation. No evidence of mitral stenosis. The mean mitral valve  gradient is 2.0 mmHg with average heart rate of 61 bpm. There is a XTW  MitralClip present in the mitral position.  Procedure Date: 12/23/2019.  7. The aortic valve is normal in structure. Aortic valve regurgitation is  not visualized. No aortic stenosis is present.  8. The inferior vena cava is normal in size with greater than 50%  respiratory variability, suggesting right atrial pressure of 3 mmHg.   EKG:  EKG not repeated  Recent Labs: 09/11/2019: Magnesium 1.8 09/30/2019: TSH 2.990 12/21/2019: ALT 43; B Natriuretic Peptide 151.2 01/13/2020: BUN 23; Creatinine, Ser 1.55; Hemoglobin 13.8; Platelets 148; Potassium 5.0; Sodium 142  Recent Lipid Panel    Component Value Date/Time   CHOL 135 09/08/2019 1254   TRIG 85 09/08/2019 1254   HDL 42 09/08/2019 1254   CHOLHDL 3.2 09/08/2019 1254   VLDL 17 09/08/2019 1254   LDLCALC 76 09/08/2019 1254   LDLDIRECT 76.0 06/22/2019 1503    Physical Exam:    VS:  BP 110/60   Pulse 62   Ht 5\' 8"  (1.727 m)   Wt 238 lb 9.6 oz (108.2 kg)   SpO2 97%   BMI 36.28 kg/m     Wt Readings from Last 3 Encounters:  01/24/20 238 lb 9.6 oz (108.2 kg)  01/17/20 238 lb (108 kg)  01/13/20 239 lb 3.2 oz (108.5 kg)     GEN: Obese. No acute distress HEENT: Normal NECK: No JVD. LYMPHATICS: No lymphadenopathy CARDIAC: No murmur.  Irregular RR no gallop, or edema. VASCULAR:  Normal Pulses. No bruits. RESPIRATORY:  Clear to auscultation without rales, wheezing or rhonchi  ABDOMEN: Soft, non-tender, non-distended, No pulsatile mass, MUSCULOSKELETAL: No deformity  SKIN: Warm and dry NEUROLOGIC:  Alert and oriented x 3 PSYCHIATRIC:  Normal affect    ASSESSMENT:    1. Chronic systolic heart failure (HCC)   2.  Persistent atrial fibrillation ([place])   3. S/P mitral valve clip implantation   4. Coronary artery disease involving coronary bypass graft of native heart with unstable angina pectoris ([place])   5. Essential hypertension   6. Type 2 diabetes mellitus without complication, without long-term current use of insulin (HCC)   7. Obstructive sleep apnea on CPAP   8. Other depression   9. Educated about COVID-19 virus infection    PLAN:    In order of problems listed above:  1. Echo done today is pending.  He is on guideline directed therapy with the only management strategy missing being SGLT2 therapy.  Depending upon the official interpretation today, may consider adding dapagliflozin.  Currently, New [place] Heart Association class II. 2. Appears to be in sinus rhythm today based on exam.  He will follow-up with Dr. Curt Bears in 3 to 4 months. 3. No evidence of mitral regurgitation on exam.  Echo interpretation pending. 4. Secondary prevention discussed.  Not having angina. 5. Excellent blood pressure control on current regimen. 6. Dapagliflozin would be a perfect choice to help with sugar and provide cardioprotection 7. He is compliant with CPAP 8. There is a probable component of situational depression related to his multiple recent vascular procedures.  He needs a trial of antidepressant therapy to see if this improves his overall psychological state.  Will depend upon his primary care physician to help with initiating. 9. Vaccinated and boosted.  Overall education and awareness concerning primary/secondary risk prevention was discussed in detail: LDL less than 70, hemoglobin A1c less than 7, blood pressure target less than 130/80 mmHg, >150 minutes of moderate aerobic activity per week, avoidance of smoking, weight control (via diet and exercise), and continued surveillance/management of/for obstructive sleep apnea.    Medication  Adjustments/Labs and Tests Ordered: Current medicines are reviewed at length with the patient today.  Concerns regarding medicines are outlined above.  No orders of the defined types were placed in this encounter.  No orders of the defined types were placed in this encounter.   Patient Instructions  Medication Instructions:  Your physician recommends that you continue on your current medications as directed. Please refer to the Current Medication list given to you today.  *If you need a refill on your cardiac medications before your next appointment, please call your pharmacy*   Lab Work: None If you have labs (blood work) drawn today and your tests are completely normal, you will receive your results only by: Marland Kitchen MyChart Message (if you have MyChart) OR . A paper copy in the mail If you have any lab test that is abnormal or we need to change your treatment, we will call you to review the results.   Testing/Procedures: None   Follow-Up: At [facility], you and your health needs are our priority.  As part of our continuing mission to provide you with exceptional heart care, we have created designated Provider Care Teams.  These Care Teams include your primary Cardiologist (physician) and Advanced Practice Providers (APPs -  Physician Assistants and Nurse Practitioners) who all work together to provide you with the care you need, when you need it.  We recommend signing up for the patient portal called "MyChart".  Sign up information is provided on this After Visit Summary.  MyChart is used to connect with patients for Virtual Visits (Telemedicine).  Patients are able to view lab/test results, encounter notes, upcoming appointments, etc.  Non-urgent messages can be sent to your provider as well.  To learn more about what you can do with MyChart, go to [email or web address].    Your next appointment:   4 month(s)  The format for your next appointment:   In Person  Provider:    You may see Raymond Grooms, MD or one of the following Advanced Practice Providers on your designated Care Team:    Truitt Merle, NP  Cecilie Kicks, NP  Kathyrn Drown, NP    Other Instructions      Signed, Raymond Grooms, MD  01/24/2020 11:09 AM    [place]

## 2020-01-24 ENCOUNTER — Other Ambulatory Visit: Payer: Self-pay

## 2020-01-24 ENCOUNTER — Ambulatory Visit: Payer: Medicare (Managed Care) | Admitting: Family Medicine

## 2020-01-24 ENCOUNTER — Encounter: Payer: Self-pay | Admitting: Family Medicine

## 2020-01-24 ENCOUNTER — Ambulatory Visit (HOSPITAL_COMMUNITY): Payer: Medicare (Managed Care) | Attending: Internal Medicine

## 2020-01-24 ENCOUNTER — Encounter: Payer: Self-pay | Admitting: Interventional Cardiology

## 2020-01-24 ENCOUNTER — Ambulatory Visit: Payer: Medicare (Managed Care) | Admitting: Interventional Cardiology

## 2020-01-24 ENCOUNTER — Telehealth (HOSPITAL_COMMUNITY): Payer: Self-pay | Admitting: Emergency Medicine

## 2020-01-24 VITALS — BP 108/68 | HR 74 | Temp 98.0°F | Ht 68.0 in | Wt 239.0 lb

## 2020-01-24 VITALS — BP 110/60 | HR 62 | Ht 68.0 in | Wt 238.6 lb

## 2020-01-24 DIAGNOSIS — E119 Type 2 diabetes mellitus without complications: Secondary | ICD-10-CM

## 2020-01-24 DIAGNOSIS — G4733 Obstructive sleep apnea (adult) (pediatric): Secondary | ICD-10-CM

## 2020-01-24 DIAGNOSIS — I25700 Atherosclerosis of coronary artery bypass graft(s), unspecified, with unstable angina pectoris: Secondary | ICD-10-CM

## 2020-01-24 DIAGNOSIS — I5022 Chronic systolic (congestive) heart failure: Secondary | ICD-10-CM | POA: Diagnosis not present

## 2020-01-24 DIAGNOSIS — I10 Essential (primary) hypertension: Secondary | ICD-10-CM

## 2020-01-24 DIAGNOSIS — L989 Disorder of the skin and subcutaneous tissue, unspecified: Secondary | ICD-10-CM

## 2020-01-24 DIAGNOSIS — I340 Nonrheumatic mitral (valve) insufficiency: Secondary | ICD-10-CM | POA: Diagnosis not present

## 2020-01-24 DIAGNOSIS — F321 Major depressive disorder, single episode, moderate: Secondary | ICD-10-CM | POA: Diagnosis not present

## 2020-01-24 DIAGNOSIS — Z9889 Other specified postprocedural states: Secondary | ICD-10-CM

## 2020-01-24 DIAGNOSIS — Z7189 Other specified counseling: Secondary | ICD-10-CM

## 2020-01-24 DIAGNOSIS — I4819 Other persistent atrial fibrillation: Secondary | ICD-10-CM

## 2020-01-24 DIAGNOSIS — Z95818 Presence of other cardiac implants and grafts: Secondary | ICD-10-CM | POA: Diagnosis not present

## 2020-01-24 DIAGNOSIS — F3289 Other specified depressive episodes: Secondary | ICD-10-CM

## 2020-01-24 DIAGNOSIS — R454 Irritability and anger: Secondary | ICD-10-CM

## 2020-01-24 DIAGNOSIS — Z9989 Dependence on other enabling machines and devices: Secondary | ICD-10-CM

## 2020-01-24 LAB — ECHOCARDIOGRAM COMPLETE
Area-P 1/2: 5.16 cm2
S' Lateral: 3.1 cm

## 2020-01-24 MED ORDER — PERFLUTREN LIPID MICROSPHERE
1.0000 mL | INTRAVENOUS | Status: AC | PRN
  Administered 2020-01-24: 1 mL via INTRAVENOUS
  Administered 2020-01-24: 3 mL via INTRAVENOUS

## 2020-01-24 MED ORDER — BUPROPION HCL ER (XL) 150 MG PO TB24: 150.0000 mg | ORAL_TABLET | Freq: Every day | ORAL | 3 refills | Status: DC

## 2020-01-24 NOTE — Patient Instructions (Signed)
Medication Instructions:  Your physician recommends that you continue on your current medications as directed. Please refer to the Current Medication list given to you today.  *If you need a refill on your cardiac medications before your next appointment, please call your pharmacy*   Lab Work: None If you have labs (blood work) drawn today and your tests are completely normal, you will receive your results only by: . MyChart Message (if you have MyChart) OR . A paper copy in the mail If you have any lab test that is abnormal or we need to change your treatment, we will call you to review the results.   Testing/Procedures: None   Follow-Up: At [facility], you and your health needs are our priority.  As part of our continuing mission to provide you with exceptional heart care, we have created designated Provider Care Teams.  These Care Teams include your primary Cardiologist (physician) and Advanced Practice Providers (APPs -  Physician Assistants and Nurse Practitioners) who all work together to provide you with the care you need, when you need it.  We recommend signing up for the patient portal called "MyChart".  Sign up information is provided on this After Visit Summary.  MyChart is used to connect with patients for Virtual Visits (Telemedicine).  Patients are able to view lab/test results, encounter notes, upcoming appointments, etc.  Non-urgent messages can be sent to your provider as well.   To learn more about what you can do with MyChart, go to [email or web address].    Your next appointment:   4 month(s)  The format for your next appointment:   In Person  Provider:   You may see Henry W Smith III, MD or one of the following Advanced Practice Providers on your designated Care Team:    Lori Gerhardt, NP  Laura Ingold, NP  Jill McDaniel, NP    Other Instructions   

## 2020-01-24 NOTE — Progress Notes (Signed)
Chief Complaint  Patient presents with  . Foot Pain    Left foot pain     Subjective: Patient is a 80 y.o. male here for f/u. Here w his wife.   His foot pain has improved since wearing the flat soled shoe.  The lesion on his inner left pinky toe appears the same.  They have been putting triple antibiotic ointment without improvement.  There is no drainage, spreading redness, itching or fevers.  The patient describes his mood as "crappy".  He has been on Effexor, Zoloft, BuSpar, Remeron, Cymbalta, and Lexapro.  He was following with a counselor but did not feel that was helpful.  No homicidal or suicidal ideation.  He does have elements of both depression and irritability/anger.  He has never followed with a psychiatrist.  Past Medical History:  Diagnosis Date  . Arthritis   . CAD (coronary artery disease)   . Carotid artery disease ([place])    s/p left CEA 01/17/14  . Chronic systolic CHF (congestive heart failure) ([place])   . Concussion Summer 2012  . Essential hypertension 02/26/2017  . Heart disease   . Hyperlipidemia   . Hypothyroidism 10/15/2017  . Major depressive disorder   . Mitral valve regurgitation   . Myocardial infarction ([place]) 1996, 2021  . Obstructive sleep apnea on CPAP   . Persistent atrial fibrillation ([place])   . Poor flexibility of tendon 12/01/2018  . 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 Burt Knack  . Subungual hematoma of digit of hand 12/01/2018  . Subungual hematoma of right foot 12/01/2018  . Thyroid disease   . Type 2 diabetes mellitus without complication, without long-term current use of insulin (HCC) 02/26/2017    Objective: BP 108/68 (BP Location: Left Arm, Patient Position: Sitting, Cuff Size: Normal)   Pulse 74   Temp 98 F (36.7 C) (Oral)   Ht 5\' 8"  (1.727 m)   Wt 239 lb (108.4 kg)   SpO2 95%   BMI 36.34 kg/m  General: Awake, appears stated age Skin: Medially over the left fifth digit of the  foot, there appears to be some sort of dried blood and thickened tissue Heart: Brisk capillary refill Lungs: No accessory muscle use Psych: Age appropriate judgment and insight, normal affect and mood  Assessment and Plan: Skin lesion  Depression, major, single episode, moderate (HCC) - Plan: buPROPion (WELLBUTRIN XL) 150 MG 24 hr tablet  Irritability and anger - Plan: buPROPion (WELLBUTRIN XL) 150 MG 24 hr tablet  1.  I was able to pare down the lesion so now there appears to be an ecchymotic area of mildly thickened skin.  There is no fluctuance or fluid-filled area.  No erythema or excessive warmth.  If he does not start to have improvement in his symptoms, I will refer him to the podiatry team. 2/3.  He does have elements of both depression and irritability/anger not manifesting as anxiety.  We will start Wellbutrin to see if he can help with symptoms.  I would consider a low-dose of nortriptyline or possibly Abilify if no improvement.  I would like to see him back in another month to recheck this. The patient and his wife voiced understanding and agreement to the plan.  [place], DO 01/24/20  3:35 PM

## 2020-01-24 NOTE — Telephone Encounter (Signed)
Attempted to call patient regarding upcoming cardiac CT appointment. Left message on voicemail with name and callback number Marchia Bond RN Navigator Cardiac Imaging [facility] [phone] Office [phone] Cell  Need to inform patient of added appt for IVF in the infusion clinic pre-/post-scan Clarise Cruz

## 2020-01-24 NOTE — Patient Instructions (Addendum)
Continue wearing the shoe as needed.  If we are still having issues by the end of the week, send me a message. We will then place a referral to the podiatry team.  Let us know if you need anything.  [facility] [address] [place], [place] [address] [phone]  [facility] [address] [place], [place] [address] [phone]  [facility] [place], [place] [address] [phone]  [facility] [address], [place], [place], #[phone] [address], [place], [place], [place]  [facility] [place], [place] and [place], [place], [place]  [facility] [place], [place]  Call one of these offices sooner than later as it can take 2-3 months to get a new patient appointment.

## 2020-01-25 ENCOUNTER — Telehealth (HOSPITAL_COMMUNITY): Payer: Self-pay | Admitting: Emergency Medicine

## 2020-01-25 NOTE — Telephone Encounter (Signed)
Pt returning phone call regarding upcoming cardiac imaging study; pt verbalizes understanding of appt date/time, parking situation and where to check in, pre-test NPO status and medications ordered, and verified current allergies; name and call back number provided for further questions should they arise Marchia Bond RN Navigator Cardiac Imaging [facility] [phone] office [phone] cell   Informed patient of need for IVF for renal insufficiency. Pt verbalized understanding.

## 2020-01-26 ENCOUNTER — Other Ambulatory Visit: Payer: Self-pay

## 2020-01-26 ENCOUNTER — Ambulatory Visit (HOSPITAL_COMMUNITY)
Admission: RE | Admit: 2020-01-26 | Discharge: 2020-01-26 | Disposition: A | Discharge status: Home / Self Care | Payer: Medicare (Managed Care) | Source: Ambulatory Visit | Attending: Cardiology | Admitting: Cardiology

## 2020-01-26 ENCOUNTER — Other Ambulatory Visit (HOSPITAL_COMMUNITY): Payer: Medicare (Managed Care)

## 2020-01-26 ENCOUNTER — Ambulatory Visit: Payer: Medicare (Managed Care) | Admitting: Physician Assistant

## 2020-01-26 DIAGNOSIS — I4819 Other persistent atrial fibrillation: Secondary | ICD-10-CM | POA: Diagnosis not present

## 2020-01-26 LAB — BASIC METABOLIC PANEL
Anion gap: 8 (ref 5–15)
BUN: 28 mg/dL — ABNORMAL HIGH (ref 8–23)
CO2: 27 mmol/L (ref 22–32)
Calcium: 9.1 mg/dL (ref 8.9–10.3)
Chloride: 106 mmol/L (ref 98–111)
Creatinine, Ser: 1.32 mg/dL — ABNORMAL HIGH (ref 0.61–1.24)
GFR, Estimated: 55 mL/min — ABNORMAL LOW (ref >60)
Glucose, Bld: 149 mg/dL — ABNORMAL HIGH (ref 70–99)
Potassium: 4.7 mmol/L (ref 3.5–5.1)
Sodium: 141 mmol/L (ref 135–145)

## 2020-01-26 MED ORDER — IOHEXOL 350 MG/ML SOLN
80.0000 mL | Freq: Once | INTRAVENOUS | Status: AC | PRN
  Administered 2020-01-26: 80 mL via INTRAVENOUS

## 2020-01-26 MED ORDER — SODIUM CHLORIDE 0.9 % WEIGHT BASED INFUSION: 1.0000 mL/kg/h | INTRAVENOUS | Status: DC

## 2020-01-26 MED ORDER — SODIUM CHLORIDE 0.9 % WEIGHT BASED INFUSION
3.0000 mL/kg/h | INTRAVENOUS | Status: AC
  Administered 2020-01-26: 3 mL/kg/h via INTRAVENOUS

## 2020-01-29 ENCOUNTER — Other Ambulatory Visit (HOSPITAL_COMMUNITY)
Admission: RE | Admit: 2020-01-29 | Discharge: 2020-01-29 | Disposition: A | Discharge status: Home / Self Care | Payer: Medicare (Managed Care) | Source: Ambulatory Visit | Attending: Cardiology | Admitting: Cardiology

## 2020-01-29 DIAGNOSIS — Z01812 Encounter for preprocedural laboratory examination: Secondary | ICD-10-CM | POA: Diagnosis present

## 2020-01-29 DIAGNOSIS — Z20822 Contact with and (suspected) exposure to covid-19: Secondary | ICD-10-CM | POA: Diagnosis not present

## 2020-01-29 LAB — SARS CORONAVIRUS 2 (TAT 6-24 HRS): SARS Coronavirus 2: NEGATIVE

## 2020-01-31 NOTE — Anesthesia Preprocedure Evaluation (Addendum)
Anesthesia Evaluation  Patient identified by MRN, date of birth, ID band Patient awake    Reviewed: Allergy & Precautions, NPO status , Patient's Chart, lab work & pertinent test results, reviewed documented beta blocker date and time   Airway Mallampati: III  TM Distance: >3 FB Neck ROM: Full    Dental  (+) Teeth Intact, Dental Advisory Given   Pulmonary sleep apnea and Continuous Positive Airway Pressure Ventilation , former smoker,    Pulmonary exam normal breath sounds clear to auscultation       Cardiovascular hypertension, Pt. on home beta blockers and Pt. on medications + CAD, + Past MI, + Cardiac Stents, + Peripheral Vascular Disease (s/p L CEA) and +CHF  + dysrhythmias Atrial Fibrillation + Valvular Problems/Murmurs (s/p mitra-clip)  Rhythm:Irregular Rate:Abnormal  Echo 01/24/20: IMPRESSIONS    1. Left ventricular ejection fraction, by estimation, is 40 to 45%. The  left ventricle has mildly decreased function. The left ventricle  demonstrates global hypokinesis. Left ventricular diastolic parameters are  indeterminate.  2. Right ventricular systolic function is mildly reduced. The right  ventricular size is normal.  3. Left atrial size was mildly dilated.  4. Right atrial size was mildly dilated.  5. There is a XTW MitralClip present in the mitral position at A2-P2 with  mild residual regurgitation. The mitral valve has been repaired/replaced.  Mild mitral valve regurgitation. The mean mitral valve gradient is 1.4  mmHg with average heart rate of 66  bpm.  6. The aortic valve is grossly normal. There is mild calcification of the  aortic valve. There is mild thickening of the aortic valve. Aortic valve  regurgitation is not visualized. No aortic stenosis is present.  7. The inferior vena cava is normal in size with greater than 50%  respiratory variability, suggesting right atrial pressure of 3 mmHg.     Neuro/Psych PSYCHIATRIC DISORDERS Depression negative neurological ROS     GI/Hepatic Neg liver ROS, GERD  Medicated,  Endo/Other  diabetes, Type 2Hypothyroidism Obesity   Renal/GU negative Renal ROS     Musculoskeletal  (+) Arthritis ,   Abdominal   Peds  Hematology  (+) Blood dyscrasia (Eliquis), ,   Anesthesia Other Findings   Reproductive/Obstetrics                            Anesthesia Physical Anesthesia Plan  ASA: IV  Anesthesia Plan: General   Post-op Pain Management:    Induction: Intravenous  PONV Risk Score and Plan: 2 and Ondansetron and Dexamethasone  Airway Management Planned: Oral ETT  Additional Equipment:   Intra-op Plan:   Post-operative Plan: Extubation in OR  Informed Consent: I have reviewed the patients History and Physical, chart, labs and discussed the procedure including the risks, benefits and alternatives for the proposed anesthesia with the patient or authorized representative who has indicated his/her understanding and acceptance.     Dental advisory given  Plan Discussed with: CRNA  Anesthesia Plan Comments: (Patient requesting no foley)       Anesthesia Quick Evaluation

## 2020-01-31 NOTE — Progress Notes (Signed)
Instructed patient on the following items: Arrival time 0530 Nothing to eat or drink after midnight No meds AM of procedure Responsible person to drive you home and stay with you for 24 hrs  Have you missed any doses of anti-coagulant Eliquis- hasn't missed any doses    

## 2020-02-01 ENCOUNTER — Other Ambulatory Visit: Payer: Self-pay

## 2020-02-01 ENCOUNTER — Ambulatory Visit (HOSPITAL_COMMUNITY)
Admission: RE | Admit: 2020-02-01 | Discharge: 2020-02-01 | Disposition: A | Discharge status: Home / Self Care | Payer: Medicare (Managed Care) | Attending: Cardiology | Admitting: Cardiology

## 2020-02-01 ENCOUNTER — Ambulatory Visit (HOSPITAL_COMMUNITY)
Admission: RE | Disposition: A | Discharge status: Home / Self Care | Payer: Medicare (Managed Care) | Source: Home / Self Care | Attending: Cardiology

## 2020-02-01 ENCOUNTER — Ambulatory Visit (HOSPITAL_COMMUNITY): Payer: Medicare (Managed Care) | Admitting: Anesthesiology

## 2020-02-01 ENCOUNTER — Encounter (HOSPITAL_COMMUNITY): Payer: Self-pay | Admitting: Cardiology

## 2020-02-01 ENCOUNTER — Ambulatory Visit (HOSPITAL_BASED_OUTPATIENT_CLINIC_OR_DEPARTMENT_OTHER): Payer: Medicare (Managed Care)

## 2020-02-01 DIAGNOSIS — I5022 Chronic systolic (congestive) heart failure: Secondary | ICD-10-CM | POA: Diagnosis not present

## 2020-02-01 DIAGNOSIS — Z7902 Long term (current) use of antithrombotics/antiplatelets: Secondary | ICD-10-CM | POA: Insufficient documentation

## 2020-02-01 DIAGNOSIS — I351 Nonrheumatic aortic (valve) insufficiency: Secondary | ICD-10-CM | POA: Diagnosis not present

## 2020-02-01 DIAGNOSIS — I4891 Unspecified atrial fibrillation: Secondary | ICD-10-CM | POA: Diagnosis not present

## 2020-02-01 DIAGNOSIS — I340 Nonrheumatic mitral (valve) insufficiency: Secondary | ICD-10-CM | POA: Insufficient documentation

## 2020-02-01 DIAGNOSIS — Z79899 Other long term (current) drug therapy: Secondary | ICD-10-CM | POA: Insufficient documentation

## 2020-02-01 DIAGNOSIS — G4733 Obstructive sleep apnea (adult) (pediatric): Secondary | ICD-10-CM | POA: Insufficient documentation

## 2020-02-01 DIAGNOSIS — I255 Ischemic cardiomyopathy: Secondary | ICD-10-CM | POA: Insufficient documentation

## 2020-02-01 DIAGNOSIS — Z79890 Hormone replacement therapy (postmenopausal): Secondary | ICD-10-CM | POA: Diagnosis not present

## 2020-02-01 DIAGNOSIS — Z87891 Personal history of nicotine dependence: Secondary | ICD-10-CM | POA: Insufficient documentation

## 2020-02-01 DIAGNOSIS — Z7901 Long term (current) use of anticoagulants: Secondary | ICD-10-CM | POA: Diagnosis not present

## 2020-02-01 DIAGNOSIS — I110 Hypertensive heart disease with heart failure: Secondary | ICD-10-CM | POA: Insufficient documentation

## 2020-02-01 DIAGNOSIS — Z951 Presence of aortocoronary bypass graft: Secondary | ICD-10-CM | POA: Insufficient documentation

## 2020-02-01 DIAGNOSIS — Z882 Allergy status to sulfonamides status: Secondary | ICD-10-CM | POA: Diagnosis not present

## 2020-02-01 DIAGNOSIS — I361 Nonrheumatic tricuspid (valve) insufficiency: Secondary | ICD-10-CM

## 2020-02-01 DIAGNOSIS — I4819 Other persistent atrial fibrillation: Secondary | ICD-10-CM | POA: Insufficient documentation

## 2020-02-01 DIAGNOSIS — I2510 Atherosclerotic heart disease of native coronary artery without angina pectoris: Secondary | ICD-10-CM | POA: Insufficient documentation

## 2020-02-01 HISTORY — PX: TEE WITHOUT CARDIOVERSION: SHX5443

## 2020-02-01 HISTORY — PX: ATRIAL FIBRILLATION ABLATION: EP1191

## 2020-02-01 LAB — ECHO TEE
Height: 68 in
Weight: 3712 oz

## 2020-02-01 LAB — GLUCOSE, CAPILLARY
Glucose-Capillary: 121 mg/dL — ABNORMAL HIGH (ref 70–99)
Glucose-Capillary: 122 mg/dL — ABNORMAL HIGH (ref 70–99)

## 2020-02-01 SURGERY — ATRIAL FIBRILLATION ABLATION
Anesthesia: General

## 2020-02-01 MED ORDER — SODIUM CHLORIDE 0.9% FLUSH: 3.0000 mL | INTRAVENOUS | Status: DC | PRN

## 2020-02-01 MED ORDER — DOBUTAMINE IN D5W 4-5 MG/ML-% IV SOLN
INTRAVENOUS | Status: DC | PRN
  Administered 2020-02-01: 20 ug/kg/min via INTRAVENOUS

## 2020-02-01 MED ORDER — LIDOCAINE-EPINEPHRINE 1 %-1:100000 IJ SOLN
INTRAMUSCULAR | Status: AC
  Filled 2020-02-01: qty 1

## 2020-02-01 MED ORDER — ONDANSETRON HCL 4 MG/2ML IJ SOLN: 4.0000 mg | Freq: Four times a day (QID) | INTRAMUSCULAR | Status: DC | PRN

## 2020-02-01 MED ORDER — PROTAMINE SULFATE 10 MG/ML IV SOLN
INTRAVENOUS | Status: DC | PRN
  Administered 2020-02-01: 40 mg via INTRAVENOUS

## 2020-02-01 MED ORDER — HEPARIN SODIUM (PORCINE) 1000 UNIT/ML IJ SOLN
INTRAMUSCULAR | Status: DC | PRN
  Administered 2020-02-01: 1000 [IU] via INTRAVENOUS

## 2020-02-01 MED ORDER — SODIUM CHLORIDE 0.9 % IV SOLN: INTRAVENOUS | Status: DC

## 2020-02-01 MED ORDER — DEXAMETHASONE SODIUM PHOSPHATE 10 MG/ML IJ SOLN
INTRAMUSCULAR | Status: DC | PRN
  Administered 2020-02-01: 5 mg via INTRAVENOUS

## 2020-02-01 MED ORDER — LIDOCAINE 2% (20 MG/ML) 5 ML SYRINGE
INTRAMUSCULAR | Status: DC | PRN
  Administered 2020-02-01: 80 mg via INTRAVENOUS

## 2020-02-01 MED ORDER — FENTANYL CITRATE (PF) 250 MCG/5ML IJ SOLN
INTRAMUSCULAR | Status: DC | PRN
  Administered 2020-02-01: 100 ug via INTRAVENOUS

## 2020-02-01 MED ORDER — ACETAMINOPHEN 500 MG PO TABS
1000.0000 mg | ORAL_TABLET | Freq: Once | ORAL | Status: AC
  Administered 2020-02-01: 1000 mg via ORAL

## 2020-02-01 MED ORDER — DOBUTAMINE IN D5W 4-5 MG/ML-% IV SOLN
INTRAVENOUS | Status: AC
  Filled 2020-02-01: qty 250

## 2020-02-01 MED ORDER — HEPARIN SODIUM (PORCINE) 1000 UNIT/ML IJ SOLN
INTRAMUSCULAR | Status: DC | PRN
  Administered 2020-02-01: 15000 [IU] via INTRAVENOUS

## 2020-02-01 MED ORDER — PHENYLEPHRINE 40 MCG/ML (10ML) SYRINGE FOR IV PUSH (FOR BLOOD PRESSURE SUPPORT)
PREFILLED_SYRINGE | INTRAVENOUS | Status: DC | PRN
  Administered 2020-02-01: 160 ug via INTRAVENOUS
  Administered 2020-02-01: 80 ug via INTRAVENOUS

## 2020-02-01 MED ORDER — ACETAMINOPHEN 500 MG PO TABS
ORAL_TABLET | ORAL | Status: AC
  Filled 2020-02-01: qty 2

## 2020-02-01 MED ORDER — ACETAMINOPHEN 325 MG PO TABS
650.0000 mg | ORAL_TABLET | ORAL | Status: DC | PRN
  Filled 2020-02-01: qty 2

## 2020-02-01 MED ORDER — ROCURONIUM BROMIDE 10 MG/ML (PF) SYRINGE
PREFILLED_SYRINGE | INTRAVENOUS | Status: DC | PRN
  Administered 2020-02-01: 70 mg via INTRAVENOUS

## 2020-02-01 MED ORDER — SODIUM CHLORIDE 0.9 % IV SOLN: 250.0000 mL | INTRAVENOUS | Status: DC | PRN

## 2020-02-01 MED ORDER — PROPOFOL 10 MG/ML IV BOLUS
INTRAVENOUS | Status: DC | PRN
  Administered 2020-02-01: 140 mg via INTRAVENOUS

## 2020-02-01 MED ORDER — FENTANYL CITRATE (PF) 250 MCG/5ML IJ SOLN
INTRAMUSCULAR | Status: AC
  Filled 2020-02-01: qty 5

## 2020-02-01 MED ORDER — PHENYLEPHRINE HCL-NACL 10-0.9 MG/250ML-% IV SOLN
INTRAVENOUS | Status: DC | PRN
  Administered 2020-02-01: 30 ug/min via INTRAVENOUS

## 2020-02-01 MED ORDER — APIXABAN 5 MG PO TABS
5.0000 mg | ORAL_TABLET | Freq: Once | ORAL | Status: AC
  Administered 2020-02-01: 5 mg via ORAL
  Filled 2020-02-01: qty 1

## 2020-02-01 MED ORDER — ONDANSETRON HCL 4 MG/2ML IJ SOLN
INTRAMUSCULAR | Status: DC | PRN
  Administered 2020-02-01: 4 mg via INTRAVENOUS

## 2020-02-01 MED ORDER — EPHEDRINE SULFATE-NACL 50-0.9 MG/10ML-% IV SOSY
PREFILLED_SYRINGE | INTRAVENOUS | Status: DC | PRN
  Administered 2020-02-01 (×2): 5 mg via INTRAVENOUS

## 2020-02-01 MED ORDER — HEPARIN (PORCINE) IN NACL 1000-0.9 UT/500ML-% IV SOLN
INTRAVENOUS | Status: DC | PRN
  Administered 2020-02-01 (×5): 500 mL

## 2020-02-01 MED ORDER — SUGAMMADEX SODIUM 200 MG/2ML IV SOLN
INTRAVENOUS | Status: DC | PRN
  Administered 2020-02-01: 150 mg via INTRAVENOUS

## 2020-02-01 SURGICAL SUPPLY — 21 items
BLANKET WARM UNDERBOD FULL ACC (MISCELLANEOUS) ×4 IMPLANT
CATH 8FR REPROCESSED SOUNDSTAR (CATHETERS) ×4 IMPLANT
CATH MAPPNG PENTARAY F 2-6-2MM (CATHETERS) ×2 IMPLANT
CATH S CIRCA THERM PROBE 10F (CATHETERS) ×4 IMPLANT
CATH SMTCH THERMOCOOL SF DF (CATHETERS) ×4 IMPLANT
CATH WEB BI DIR CSDF CRV REPRO (CATHETERS) ×4 IMPLANT
CLOSURE PERCLOSE PROSTYLE (VASCULAR PRODUCTS) ×16 IMPLANT
COVER SWIFTLINK CONNECTOR (BAG) ×4 IMPLANT
KIT VERSACROSS STEERABLE D1 (CATHETERS) ×4 IMPLANT
MAT PREVALON FULL STRYKER (MISCELLANEOUS) ×4 IMPLANT
PACK EP LATEX FREE (CUSTOM PROCEDURE TRAY) ×4
PACK EP LF (CUSTOM PROCEDURE TRAY) ×2 IMPLANT
PAD PRO RADIOLUCENT 2001M-C (PAD) ×4 IMPLANT
PATCH CARTO3 (PAD) ×4 IMPLANT
PENTARAY F 2-6-2MM (CATHETERS) ×4
SHEATH CARTO VIZIGO SM CVD (SHEATH) ×4 IMPLANT
SHEATH PINNACLE 7F 10CM (SHEATH) ×4 IMPLANT
SHEATH PINNACLE 8F 10CM (SHEATH) ×8 IMPLANT
SHEATH PINNACLE 9F 10CM (SHEATH) ×4 IMPLANT
SHEATH PROBE COVER 6X72 (BAG) ×4 IMPLANT
TUBING SMART ABLATE COOLFLOW (TUBING) ×4 IMPLANT

## 2020-02-01 NOTE — Transfer of Care (Signed)
Immediate Anesthesia Transfer of Care Note  Patient: Raymond Christensen  Procedure(s) Performed: ATRIAL FIBRILLATION ABLATION (N/A ) TRANSESOPHAGEAL ECHOCARDIOGRAM (TEE)  Patient Location: Cath Lab  Anesthesia Type:General  Level of Consciousness: drowsy and patient cooperative  Airway & Oxygen Therapy: Patient Spontanous Breathing  Post-op Assessment: Report given to RN, Post -op Vital signs reviewed and stable and Patient moving all extremities X 4  Post vital signs: Reviewed and stable  Last Vitals:  Vitals Value Taken Time  BP 93/46 02/01/20 1022  Temp 36.4 C 02/01/20 1021  Pulse 53 02/01/20 1024  Resp 13 02/01/20 1024  SpO2 98 % 02/01/20 1024  Vitals shown include unvalidated device data.  Last Pain:  Vitals:   02/01/20 1021  TempSrc: Temporal  PainSc: 0-No pain      Patients Stated Pain Goal: 3 (94/85/46 2703)  Complications: No complications documented.

## 2020-02-01 NOTE — Anesthesia Postprocedure Evaluation (Signed)
Anesthesia Post Note  Patient: Gregoire Bennis  Procedure(s) Performed: ATRIAL FIBRILLATION ABLATION (N/A ) TRANSESOPHAGEAL ECHOCARDIOGRAM (TEE)     Patient location during evaluation: Cath Lab Anesthesia Type: General Level of consciousness: awake and alert Pain management: pain level controlled Vital Signs Assessment: post-procedure vital signs reviewed and stable Respiratory status: spontaneous breathing, nonlabored ventilation, respiratory function stable and patient connected to nasal cannula oxygen Cardiovascular status: blood pressure returned to baseline and stable Postop Assessment: no apparent nausea or vomiting Anesthetic complications: no   No complications documented.  Last Vitals:  Vitals:   02/01/20 1120 02/01/20 1123  BP: (!) 107/52 (!) 107/52  Pulse: 61 61  Resp: 13 14  Temp:    SpO2: 96% 97%    Last Pain:  Vitals:   02/01/20 1120  TempSrc:   PainSc: 0-No pain                 Catalina Gravel

## 2020-02-01 NOTE — CV Procedure (Signed)
    TRANSESOPHAGEAL ECHOCARDIOGRAM   NAME:  Raymond Christensen    MRN: [id number] DOB:  July 01, 1940    ADMIT DATE: 02/01/2020  INDICATIONS: Evaluation of thrombus  PROCEDURE:   Informed consent was obtained prior to the procedure. The risks, benefits and alternatives for the procedure were discussed and the patient comprehended these risks.  Risks include, but are not limited to, cough, sore throat, vomiting, nausea, somnolence, esophageal and stomach trauma or perforation, bleeding, low blood pressure, aspiration, pneumonia, infection, trauma to the teeth and death.    Procedural time out performed. The oropharynx was anesthetized with topical 1% benzocaine.    Anesthesia was administered by anesthesia team (please see separate documenation).  The patient's heart rate, blood pressure, and oxygen saturation are monitored continuously during the procedure. The period of conscious sedation is 20 minutes, of which I was present face-to-face 100% of this time.   The transesophageal probe was inserted in the esophagus and stomach without difficulty and multiple views were obtained.   COMPLICATIONS:    There were no immediate complications.  KEY FINDINGS:  1. No left atrial appendage thrombus 2. Mitral-Clip is well seated  3. Iatrogenic left to right shunt still remains 4. Full report to follow. 5. Further management per primary team.   Rudean Haskell, MD [place]  7:59 AM

## 2020-02-01 NOTE — Progress Notes (Signed)
  Echocardiogram Echocardiogram Transesophageal has been performed.  Raymond Christensen 02/01/2020, 8:41 AM

## 2020-02-01 NOTE — Progress Notes (Signed)
Pt has had slight oozing right femoral vein, no hematoma no pain, pressure has been held x 2 last time was 30 minutes with slight oozing continuing. Tyler Aas PA /EP informed Dr Curt Bears, pt to stay laryng down till seen.

## 2020-02-01 NOTE — Progress Notes (Signed)
Pt not sure if he takes Eliquis twice a day or not. He states he took last dose yesterday at 1000, and only took once. He states he is not sure but he thinks he just takes once a day.

## 2020-02-01 NOTE — Discharge Instructions (Signed)
Post procedure care instructions No driving for 4 days. No lifting over 5 lbs for 1 week. No vigorous or sexual activity for 1 week. You may return to work/your usual activities on 02/08/20. Keep procedure site clean & dry. If you notice increased pain, swelling, bleeding or pus, call/return!  You may shower after 24 hours, but no soaking in baths/hot tubs/pools for 1 week.       Cardiac Ablation, Care After  This sheet gives you information about how to care for yourself after your procedure. Your health care provider may also give you more specific instructions. If you have problems or questions, contact your health care provider. What can I expect after the procedure? After the procedure, it is common to have:  Bruising around your puncture site.  Tenderness around your puncture site.  Skipped heartbeats.  Tiredness (fatigue).  Follow these instructions at home: Puncture site care   Follow instructions from your health care provider about how to take care of your puncture site. Make sure you: ? If present, leave stitches (sutures), skin glue, or adhesive strips in place. These skin closures may need to stay in place for up to 2 weeks. If adhesive strip edges start to loosen and curl up, you may trim the loose edges. Do not remove adhesive strips completely unless your health care provider tells you to do that. ? If a large square bandage is present, this may be removed 24 hours after surgery.   Check your puncture site every day for signs of infection. Check for: ? Redness, swelling, or pain. ? Fluid or blood. If your puncture site starts to bleed, lie down on your back, apply firm pressure to the area, and contact your health care provider. ? Warmth. ? Pus or a bad smell. Driving  Do not drive for at least 4 days after your procedure or however long your health care provider recommends. (Do not resume driving if you have previously been instructed not to drive for other health  reasons.)  Do not drive or use heavy machinery while taking prescription pain medicine. Activity  Avoid activities that take a lot of effort for at least 7 days after your procedure.  Do not lift anything that is heavier than 5 lb (4.5 kg) for one week.   No sexual activity for 1 week.   Return to your normal activities as told by your health care provider. Ask your health care provider what activities are safe for you. General instructions  Take over-the-counter and prescription medicines only as told by your health care provider.  Do not use any products that contain nicotine or tobacco, such as cigarettes and e-cigarettes. If you need help quitting, ask your health care provider.  You may shower after 24 hours, but Do not take baths, swim, or use a hot tub for 1 week.   Do not drink alcohol for 24 hours after your procedure.  Keep all follow-up visits as told by your health care provider. This is important. Contact a health care provider if:  You have redness, mild swelling, or pain around your puncture site.  You have fluid or blood coming from your puncture site that stops after applying firm pressure to the area.  Your puncture site feels warm to the touch.  You have pus or a bad smell coming from your puncture site.  You have a fever.  You have chest pain or discomfort that spreads to your neck, jaw, or arm.  You are sweating a  lot.  You feel nauseous.  You have a fast or irregular heartbeat.  You have shortness of breath.  You are dizzy or light-headed and feel the need to lie down.  You have pain or numbness in the arm or leg closest to your puncture site. Get help right away if:  Your puncture site suddenly swells.  Your puncture site is bleeding and the bleeding does not stop after applying firm pressure to the area. These symptoms may represent a serious problem that is an emergency. Do not wait to see if the symptoms will go away. Get medical help  right away. Call your local emergency services (911 in the [place] Do not drive yourself to the hospital. Summary  After the procedure, it is normal to have bruising and tenderness at the puncture site in your groin, neck, or forearm.  Check your puncture site every day for signs of infection.  Get help right away if your puncture site is bleeding and the bleeding does not stop after applying firm pressure to the area. This is a medical emergency. This information is not intended to replace advice given to you by your health care provider. Make sure you discuss any questions you have with your health care provider.    You have an appointment set up with the [facility].  Multiple studies have shown that being followed by a dedicated atrial fibrillation clinic in addition to the standard care you receive from your other physicians improves health. We believe that enrollment in the atrial fibrillation clinic will allow [place] to better care for you.   The phone number to the [facility] is [phone]. The clinic is staffed Monday through Friday from 8:30am to 5pm.  Parking Directions: The clinic is located in the Heart and Vascular Building connected to [facility]. 1)From [address] turn on to [address] and go to the 3rd entrance  (Heart and Vascular entrance) on the right. 2)Look to the right for Heart &Vascular Parking Garage. 3)A code for the entrance is required, for February is 1212.   4)Take the elevators to the 1st floor. Registration is in the room with the glass walls at the end of the hallway.  If you have any trouble parking or locating the clinic, please don't hesitate to call [phone].

## 2020-02-01 NOTE — Interval H&P Note (Signed)
History and Physical Interval Note:  02/01/2020 7:28 AM  Raymond Christensen  has presented today for surgery, with the diagnosis of Atrial fibrillation.  The various methods of treatment have been discussed with the patient and family. After consideration of risks, benefits and other options for treatment, the patient has consented to  Procedure(s): ATRIAL FIBRILLATION ABLATION (N/A) TRANSESOPHAGEAL ECHOCARDIOGRAM (TEE) as a surgical intervention.  The patient's history has been reviewed, patient examined, no change in status, stable for surgery.  I have reviewed the patient's chart and labs.  Questions were answered to the patient's satisfaction.     Charnise Lovan A Tavaris Eudy

## 2020-02-01 NOTE — Interval H&P Note (Signed)
History and Physical Interval Note:  02/01/2020 7:03 AM  Raymond Christensen  has presented today for surgery, with the diagnosis of Atrial fibrillation.  The various methods of treatment have been discussed with the patient and family. After consideration of risks, benefits and other options for treatment, the patient has consented to  Procedure(s): ATRIAL FIBRILLATION ABLATION (N/A) as a surgical intervention.  The patient's history has been reviewed, patient examined, no change in status, stable for surgery.  I have reviewed the patient's chart and labs.  Questions were answered to the patient's satisfaction.     Absalom Aro [employer]

## 2020-02-01 NOTE — Anesthesia Procedure Notes (Signed)
Procedure Name: Intubation Date/Time: 02/01/2020 7:46 AM Performed by: Darletta Moll, CRNA Pre-anesthesia Checklist: Patient identified, Emergency Drugs available, Suction available and Patient being monitored Patient Re-evaluated:Patient Re-evaluated prior to induction Oxygen Delivery Method: Circle system utilized Preoxygenation: Pre-oxygenation with 100% oxygen Induction Type: IV induction Ventilation: Two handed mask ventilation required and Oral airway inserted - appropriate to patient size Laryngoscope Size: Mac and 4 Grade View: Grade II Tube type: Oral Tube size: 7.5 mm Number of attempts: 1 Airway Equipment and Method: Stylet Placement Confirmation: ETT inserted through vocal cords under direct vision,  positive ETCO2 and breath sounds checked- equal and bilateral Secured at: 23 cm Tube secured with: Tape Dental Injury: Teeth and Oropharynx as per pre-operative assessment

## 2020-02-02 LAB — POCT ACTIVATED CLOTTING TIME: Activated Clotting Time: 416 seconds

## 2020-02-02 MED FILL — Lidocaine Inj 1% w/ Epinephrine-1:100000: INTRAMUSCULAR | Qty: 20 | Status: AC

## 2020-02-02 MED FILL — Dobutamine in Dextrose 5% Inj 4 MG/ML: INTRAVENOUS | Qty: 250 | Status: AC

## 2020-02-03 ENCOUNTER — Other Ambulatory Visit: Payer: Self-pay | Admitting: Cardiology

## 2020-02-03 DIAGNOSIS — R600 Localized edema: Secondary | ICD-10-CM

## 2020-02-04 LAB — POCT ACTIVATED CLOTTING TIME: Activated Clotting Time: 362 seconds

## 2020-02-10 ENCOUNTER — Other Ambulatory Visit: Payer: Self-pay | Admitting: Family Medicine

## 2020-02-10 ENCOUNTER — Telehealth: Payer: Self-pay | Admitting: Interventional Cardiology

## 2020-02-10 MED ORDER — METHYLPREDNISOLONE 4 MG PO TBPK: ORAL_TABLET | ORAL | 0 refills | Status: DC

## 2020-02-10 NOTE — Telephone Encounter (Signed)
Is Colchicine contraindicated with any of his meds?

## 2020-02-10 NOTE — Telephone Encounter (Signed)
TO provide an alternative, we need to know reason for colchicine use. Usually is gout, but colchicine has multiple indications.   Will recommend patient to contact prescribed for alternative therapy.

## 2020-02-10 NOTE — Telephone Encounter (Signed)
Pt c/o medication issue:  1. Name of Medication:  Colchicine 0.6 MG   2. How are you currently taking this medication (dosage and times per day)? As needed.  3. Are you having a reaction (difficulty breathing--STAT)? No.  4. What is your medication issue? Patient states that this medication is on a list of medications that he should not take and he wants to know if there is an alternative. Please advise.

## 2020-02-10 NOTE — Telephone Encounter (Signed)
Spoke with pt and made him aware of information from Pharmacist.  He will reach out to PCP for alternative medication.

## 2020-02-10 NOTE — Telephone Encounter (Signed)
Patient to see PCP for re-assessment and gout treatment. Will recommend to avoid colchicine if possible. May be able to use at lower dose, but needs assessment for gout management

## 2020-02-10 NOTE — Telephone Encounter (Signed)
The Colchicine is for gout in his toe.  There is a MyChart message in the chart that mentions he is wanting to know if it is ok to take Colchicine with his other medications.

## 2020-02-19 ENCOUNTER — Other Ambulatory Visit: Payer: Self-pay | Admitting: Family Medicine

## 2020-02-28 ENCOUNTER — Ambulatory Visit: Payer: Medicare (Managed Care) | Admitting: Family Medicine

## 2020-02-28 ENCOUNTER — Encounter: Payer: Self-pay | Admitting: Family Medicine

## 2020-02-28 ENCOUNTER — Other Ambulatory Visit: Payer: Self-pay

## 2020-02-28 VITALS — BP 110/68 | HR 85 | Temp 98.3°F | Ht 68.0 in | Wt 240.4 lb

## 2020-02-28 DIAGNOSIS — M79675 Pain in left toe(s): Secondary | ICD-10-CM

## 2020-02-28 DIAGNOSIS — F321 Major depressive disorder, single episode, moderate: Secondary | ICD-10-CM | POA: Diagnosis not present

## 2020-02-28 NOTE — Progress Notes (Signed)
Chief Complaint  Patient presents with  . Follow-up    Subjective Allah Reason presents for f/u anxiety/depression.  Pt is currently being treated with Wellbutrin XL 150 mg/d.  Reports around 60% improvement since treatment. No thoughts of harming self or others. No self-medication with alcohol, prescription drugs or illicit drugs. Pt is not following with a counselor/psychologist.  5-6 mo of L pinky toe pain. No inj or change in activity. We pared it down last time he was in the office which did help but it returned. XR did not show anything sinister.   Past Medical History:  Diagnosis Date  . Arthritis   . CAD (coronary artery disease)   . Carotid artery disease ([place])    s/p left CEA 01/17/14  . Chronic systolic CHF (congestive heart failure) ([place])   . Concussion Summer 2012  . Essential hypertension 02/26/2017  . Heart disease   . Hyperlipidemia   . Hypothyroidism 10/15/2017  . Major depressive disorder   . Mitral valve regurgitation   . Myocardial infarction ([place]) 1996, 2021  . Obstructive sleep apnea on CPAP   . Persistent atrial fibrillation ([place])   . Poor flexibility of tendon 12/01/2018  . 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 Burt Knack  . Subungual hematoma of digit of hand 12/01/2018  . Subungual hematoma of right foot 12/01/2018  . Thyroid disease   . Type 2 diabetes mellitus without complication, without long-term current use of insulin (HCC) 02/26/2017   Exam BP 110/68 (BP Location: Left Arm, Patient Position: Sitting, Cuff Size: Normal)   Pulse 85   Temp 98.3 F (36.8 C) (Oral)   Ht 5\' 8"  (1.727 m)   Wt 240 lb 6 oz (109 kg)   SpO2 100%   BMI 36.55 kg/m  General:  well developed, well nourished, in no apparent distress Heart: RRR, no LE edema Skin: L 5th digit medially there is a portion of hyperkeratinization with central ecchymosis. +TTP; no erythema, fluctuance, drainage, crepitus Lungs:  CTAB. No  respiratory distress Psych: well oriented with normal range of affect and age-appropriate judgement/insight, alert and oriented x4.  Assessment and Plan  Depression, major, single episode, moderate (HCC)  Pain of toe of left foot - Plan: Ambulatory referral to Podiatry  1. Cont Wellbutrin XL 150 mg/d. He will send message if he feels like he needs more. 2. Refer podiatry. F/u in 6 mo for med ck or prn. The patient voiced understanding and agreement to the plan.  [place], DO 02/28/20 8:52 AM

## 2020-02-28 NOTE — Patient Instructions (Addendum)
Try to drink 55-60 oz of water daily outside of exercise.  Get up cautiously.   If you do not hear anything about your referral in the next 1-2 weeks, call our office and ask for an update.  Let us know if you need anything.

## 2020-02-29 ENCOUNTER — Telehealth: Payer: Self-pay | Admitting: Interventional Cardiology

## 2020-02-29 ENCOUNTER — Ambulatory Visit (HOSPITAL_COMMUNITY)
Admission: RE | Admit: 2020-02-29 | Discharge: 2020-02-29 | Disposition: A | Discharge status: Home / Self Care | Payer: Medicare (Managed Care) | Source: Ambulatory Visit | Attending: Physician Assistant | Admitting: Physician Assistant

## 2020-02-29 ENCOUNTER — Encounter (HOSPITAL_COMMUNITY): Payer: Self-pay | Admitting: Physician Assistant

## 2020-02-29 VITALS — BP 114/64 | HR 55 | Ht 68.0 in | Wt 237.8 lb

## 2020-02-29 DIAGNOSIS — Z951 Presence of aortocoronary bypass graft: Secondary | ICD-10-CM | POA: Diagnosis not present

## 2020-02-29 DIAGNOSIS — Z87891 Personal history of nicotine dependence: Secondary | ICD-10-CM | POA: Insufficient documentation

## 2020-02-29 DIAGNOSIS — Z713 Dietary counseling and surveillance: Secondary | ICD-10-CM | POA: Diagnosis not present

## 2020-02-29 DIAGNOSIS — E669 Obesity, unspecified: Secondary | ICD-10-CM | POA: Diagnosis not present

## 2020-02-29 DIAGNOSIS — I110 Hypertensive heart disease with heart failure: Secondary | ICD-10-CM | POA: Diagnosis not present

## 2020-02-29 DIAGNOSIS — Z79899 Other long term (current) drug therapy: Secondary | ICD-10-CM | POA: Diagnosis not present

## 2020-02-29 DIAGNOSIS — I5022 Chronic systolic (congestive) heart failure: Secondary | ICD-10-CM | POA: Diagnosis not present

## 2020-02-29 DIAGNOSIS — D6869 Other thrombophilia: Secondary | ICD-10-CM | POA: Diagnosis not present

## 2020-02-29 DIAGNOSIS — I4819 Other persistent atrial fibrillation: Secondary | ICD-10-CM | POA: Diagnosis not present

## 2020-02-29 DIAGNOSIS — G4733 Obstructive sleep apnea (adult) (pediatric): Secondary | ICD-10-CM | POA: Insufficient documentation

## 2020-02-29 DIAGNOSIS — I2510 Atherosclerotic heart disease of native coronary artery without angina pectoris: Secondary | ICD-10-CM | POA: Diagnosis not present

## 2020-02-29 DIAGNOSIS — Z7901 Long term (current) use of anticoagulants: Secondary | ICD-10-CM | POA: Diagnosis not present

## 2020-02-29 DIAGNOSIS — Z6836 Body mass index (BMI) 36.0-36.9, adult: Secondary | ICD-10-CM | POA: Insufficient documentation

## 2020-02-29 NOTE — Telephone Encounter (Signed)
Called pt.  No answer and no VM.  Was going to advise that I am sending this message to Dr. Tamala Julian and Dr. Curt Bears (since pt had recent ablation) to make sure he is ok to go to cardiac rehab at this point.

## 2020-02-29 NOTE — Telephone Encounter (Signed)
  Mr Careaga would like to know if he can get back into Cardiac Rehab now. Please advise.

## 2020-02-29 NOTE — Progress Notes (Signed)
Primary Care Physician: [name], DO Primary Cardiologist: Dr Tamala Julian Primary Electrophysiologist: Dr Curt Bears Referring Physician: Dr Tarri Abernethy Raymond Christensen is a 80 y.o. male with a history of MI in 1994 c/b cardiac arrest s/p lytics and PTCA, CABG in 2001 (LIMA to LAD, SVG to OM-2, SVG to right PDA, SVG to D-2) with post-op AF, carotid stenosis s/p L CEA and right subclavian stenosis, diet-controlled DM, HTN, obesity, OSA on CPAP, chronic systolic CHF, severe MR s/p mitral clip, and persistent atrial fibrillation who presents for follow up in the [facility]. Patient underwent DCCV on 11/08/19 and was loaded on amiodarone but failed to maintain SR. Patient is on Eliquis for a CHADS2VASC score of 6. He is s/p afib ablation with Dr Curt Bears on 02/01/20. He reports that he has done well since the procedure. He denies any CP, swallowing pain, or groin issues.   Today, he denies symptoms of palpitations, chest pain, shortness of breath, orthopnea, PND, lower extremity edema, dizziness, presyncope, syncope, snoring, daytime somnolence, bleeding, or neurologic sequela. The patient is tolerating medications without difficulties and is otherwise without complaint today.    Atrial Fibrillation Risk Factors:  he does have symptoms or diagnosis of sleep apnea. he is compliant with CPAP therapy. he does not have a history of rheumatic fever.   he has a BMI of Body mass index is 36.16 kg/m.Marland Kitchen Filed Weights   02/29/20 1110  Weight: 107.9 kg    Family History  Problem Relation Age of Onset  . Dementia Father        Concerns surrounding Lewy body dementia, but never officially diagnosed  . Cancer Neg Hx      Atrial Fibrillation Management history:  Previous antiarrhythmic drugs: amiodarone Previous cardioversions: 11/08/19 Previous ablations: 02/01/20 CHADS2VASC score: 6 Anticoagulation history: Eliquis   Past Medical History:  Diagnosis Date  .  Arthritis   . CAD (coronary artery disease)   . Carotid artery disease ([place])    s/p left CEA 01/17/14  . Chronic systolic CHF (congestive heart failure) ([place])   . Concussion Summer 2012  . Essential hypertension 02/26/2017  . Heart disease   . Hyperlipidemia   . Hypothyroidism 10/15/2017  . Major depressive disorder   . Mitral valve regurgitation   . Myocardial infarction ([place]) 1996, 2021  . Obstructive sleep apnea on CPAP   . Persistent atrial fibrillation ([place])   . Poor flexibility of tendon 12/01/2018  . 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 Burt Knack  . Subungual hematoma of digit of hand 12/01/2018  . Subungual hematoma of right foot 12/01/2018  . Thyroid disease   . Type 2 diabetes mellitus without complication, without long-term current use of insulin ([place]) 02/26/2017   Past Surgical History:  Procedure Laterality Date  . ATRIAL FIBRILLATION ABLATION N/A 02/01/2020   Procedure: ATRIAL FIBRILLATION ABLATION;  Surgeon: [name], MD;  Location: [place] [facility];  Service: Cardiovascular;  Laterality: N/A;  . BYPASS GRAFT  2001  . CARDIAC CATHETERIZATION    . CARDIOVERSION N/A 11/08/2019   Procedure: CARDIOVERSION;  Surgeon: [name], MD;  Location: [facility] ENDOSCOPY;  Service: Cardiovascular;  Laterality: N/A;  . CAROTID ENDARTERECTOMY Left 01/17/2014  . CORONARY ANGIOPLASTY    . CORONARY ARTERY BYPASS GRAFT    . MITRAL VALVE REPAIR N/A 12/23/2019   Procedure: MITRAL VALVE REPAIR;  Surgeon: [name], MD;  Location: [place] [facility];  Service: Cardiovascular;  Laterality: N/A;  . RIGHT/LEFT HEART CATH AND CORONARY/GRAFT ANGIOGRAPHY N/A 09/09/2019   Procedure: RIGHT/LEFT HEART CATH AND CORONARY/GRAFT ANGIOGRAPHY;  Surgeon: [name], MD;  Location: [place] [facility];  Service: Cardiovascular;  Laterality: N/A;  . TEE WITHOUT CARDIOVERSION N/A 09/13/2019   Procedure: TRANSESOPHAGEAL ECHOCARDIOGRAM  (TEE);  Surgeon: [name], MD;  Location: [facility] ENDOSCOPY;  Service: Cardiovascular;  Laterality: N/A;  . TEE WITHOUT CARDIOVERSION N/A 12/23/2019   Procedure: TRANSESOPHAGEAL ECHOCARDIOGRAM (TEE);  Surgeon: [name], MD;  Location: [place] [facility];  Service: Cardiovascular;  Laterality: N/A;  . TEE WITHOUT CARDIOVERSION  02/01/2020   Procedure: TRANSESOPHAGEAL ECHOCARDIOGRAM (TEE);  Surgeon: [name], MD;  Location: [place] [facility];  Service: Cardiovascular;;  . TOTAL HIP ARTHROPLASTY  1994, 1995    Current Outpatient Medications  Medication Sig Dispense Refill  . acetaminophen (TYLENOL) 500 MG tablet Take 1,000 mg by mouth every 6 (six) hours as needed for moderate pain or headache.    Marland Kitchen amiodarone (PACERONE) 200 MG tablet Take 1 tablet (200 mg total) by mouth daily.    Marland Kitchen amoxicillin (AMOXIL) 500 MG tablet Take 4 tablets (2,000 mg total) one hour prior to all dental visits. 8 tablet 11  . apixaban (ELIQUIS) 5 MG TABS tablet Take 1 tablet (5 mg total) by mouth 2 (two) times daily. 180 tablet 3  . atorvastatin (LIPITOR) 80 MG tablet Take 1 tablet (80 mg total) by mouth daily. 90 tablet 1  . buPROPion (WELLBUTRIN XL) 150 MG 24 hr tablet Take 1 tablet (150 mg total) by mouth daily. 30 tablet 3  . carvedilol (COREG) 6.25 MG tablet Take 1 tablet (6.25 mg total) by mouth 2 (two) times daily with a meal. 180 tablet 3  . Cholecalciferol (VITAMIN D3) 250 MCG (10000 UT) capsule Take 10,000 Units by mouth daily.     . clopidogrel (PLAVIX) 75 MG tablet Take 1 tablet (75 mg total) by mouth daily with breakfast. 30 tablet 3  . ezetimibe (ZETIA) 10 MG tablet Take 1 tablet (10 mg total) by mouth daily. 90 tablet 1  . famotidine (PEPCID) 20 MG tablet Take 1 tablet (20 mg total) by mouth daily. 30 tablet 3  . finasteride (PROSCAR) 5 MG tablet TAKE 1 TABLET DAILY 90 tablet 1  . furosemide (LASIX) 40 MG tablet Take 1 tablet (40 mg total) by mouth daily. 90 tablet 3  .  levocetirizine (XYZAL) 5 MG tablet TAKE 1 TABLET EVERY EVENING 90 tablet 1  . levothyroxine (SYNTHROID) 75 MCG tablet Take 1 tablet (75 mcg total) by mouth daily. 90 tablet 1  . liothyronine (CYTOMEL) 5 MCG tablet TAKE 2 TABLETS DAILY 180 tablet 3  . Multiple Vitamins-Minerals (MULTIVITAMIN ADULT EXTRA C PO) Take 1 tablet by mouth daily.     . nitroGLYCERIN (NITROSTAT) 0.4 MG SL tablet Place 1 tablet (0.4 mg total) under the tongue every 5 (five) minutes as needed for chest pain. 30 tablet 12  . omega-3 acid ethyl esters (LOVAZA) 1 g capsule TAKE 2 CAPSULES TWICE DAILY 360 capsule 2  . potassium chloride (KLOR-CON) 10 MEQ tablet TAKE 2 TABLETS DAILY AS    NEEDED WITH EVERY 40MG  DOSEOF LASIX 90 tablet 1  . sacubitril-valsartan (ENTRESTO) 24-26 MG Take 1 tablet by mouth 2 (two) times daily. 180 tablet 3  . spironolactone (ALDACTONE) 25 MG tablet Take 0.5 tablets (12.5 mg total) by mouth daily. 135 tablet 3  . tamsulosin (FLOMAX) 0.4 MG CAPS capsule  TAKE 1 CAPSULE DAILY 90 capsule 3  . vitamin B-12 (CYANOCOBALAMIN) 1000 MCG tablet Take 1,000 mcg by mouth daily.    Marland Kitchen zinc gluconate 50 MG tablet Take 50 mg by mouth daily.     No current facility-administered medications for this encounter.    Allergies  Allergen Reactions  . Sulfa Antibiotics Rash    Questionable, he developed a diffuse rash 2 days after stopping Bactrim    Social History   Socioeconomic History  . Marital status: Married    Spouse name: Not on file  . Number of children: Not on file  . Years of education: 41  . Highest education level: Doctorate  Occupational History  . Not on file  Tobacco Use  . Smoking status: Former Smoker    Types: Cigarettes  . Smokeless tobacco: Never Used  Vaping Use  . Vaping Use: Never used  Substance and Sexual Activity  . Alcohol use: Yes    Alcohol/week: 4.0 standard drinks    Types: 3 Standard drinks or equivalent, 1 Cans of beer per week    Comment: 1 drink 3 times per week  .  Drug use: No  . Sexual activity: Yes  Other Topics Concern  . 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  Physical Exam: Vitals:   02/29/20 1110  BP: 114/64  Pulse: (!) 55  Weight: 107.9 kg  Height: 5\' 8"  (1.727 m)    GEN- The patient is well appearing elderly obese male, alert and oriented x 3 today.   Head- normocephalic, atraumatic Eyes-  Sclera clear, conjunctiva pink Ears- hearing intact Oropharynx- clear Neck- supple  Lungs- Clear to ausculation bilaterally, normal work of breathing Heart- Regular rate and rhythm, no murmurs, rubs or gallops  GI- soft, NT, ND, + BS Extremities- no clubbing, cyanosis, or edema MS- no significant deformity or atrophy Skin- no rash or lesion Psych- euthymic mood, full affect Neuro- strength and sensation are intact  Wt Readings from Last 3 Encounters:  02/29/20 107.9 kg  02/28/20 109 kg  02/01/20 105.2 kg    EKG today demonstrates  SB, 1st degree AV block Vent. rate 55 BPM PR interval 222 ms QRS duration 90 ms QT/QTc 440/420 ms  Echo 01/24/20 demonstrated  1. Left ventricular ejection fraction, by estimation, is 40 to 45%. The  left ventricle has mildly decreased function. The left ventricle  demonstrates global hypokinesis. Left ventricular diastolic parameters are  indeterminate.  2. Right ventricular systolic function is mildly reduced. The right  ventricular size is normal.  3. Left atrial size was mildly dilated.  4.  Right atrial size was mildly dilated.  5. There is a XTW MitralClip present in the mitral position at A2-P2 with  mild residual regurgitation. The mitral valve has been repaired/replaced.  Mild mitral valve regurgitation. The mean mitral valve gradient is 1.4  mmHg with average heart rate of  66  bpm.  6. The aortic valve is grossly normal. There is mild calcification of the  aortic valve. There is mild thickening of the aortic valve. Aortic valve  regurgitation is not visualized. No aortic stenosis is present.  7. The inferior vena cava is normal in size with greater than 50%  respiratory variability, suggesting right atrial pressure of 3 mmHg.   Comparison(s): A prior study was performed on 12/24/2019. Stable mitral  regurgitation with resolution of post procedural atrial shunt by Color  Doppler.  Epic records are reviewed at length today  CHA2DS2-VASc Score = 5  The patient's score is based upon: CHF History: Yes HTN History: Yes Diabetes History: Yes Stroke History: No Vascular Disease History: Yes      ASSESSMENT AND PLAN: 1. Persistent Atrial Fibrillation (ICD10:  I48.19) The patient's CHA2DS2-VASc score is 5, indicating a 7.2% annual risk of stroke.   S/p afib ablation with Dr Curt Bears on 02/01/20 Patient appears to be maintaining SR. Continue amiodarone 200 mg daily Continue Eliquis 5 mg BID with no missed doses for at least 3 months post ablation.  Continue Coreg 6.25 mg BID  2. Secondary Hypercoagulable State (ICD10:  D68.69) The patient is at significant risk for stroke/thromboembolism based upon his CHA2DS2-VASc Score of 5.  Continue Apixaban (Eliquis).   3. Obesity Body mass index is 36.16 kg/m. Lifestyle modification was discussed at length including regular exercise and weight reduction.  4. CAD S/p CABG No anginal symptoms.  5. HTN Stable, no changes today.  6. OSA The importance of adequate treatment of sleep apnea was discussed today in order to  improve our ability to maintain sinus rhythm long term. Patient reports compliance with CPAP therapy.  7. Chronic systolic CHF No signs or symptoms of fluid overload.   Follow up with Dr Curt Bears as scheduled.    [facility] [address] [place], [place] [address] [phone] 02/29/2020 11:17 AM

## 2020-03-01 NOTE — Telephone Encounter (Signed)
The patient may resume cardiac rehab.

## 2020-03-01 NOTE — Telephone Encounter (Signed)
From an EP standpoint (post ablation) pt is cleared to return to all normal activities, with no restrictions, one week post ablation.  So from EP standpoint pt able to return to cardiac rehab. Will await Dr. Tamala Julian recommendation and allow his nurse to call pt to advise once Dr. Tamala Julian gives general cardiology recommendation.Marland KitchenMarland KitchenMarland Kitchen

## 2020-03-02 ENCOUNTER — Other Ambulatory Visit: Payer: Self-pay

## 2020-03-02 ENCOUNTER — Ambulatory Visit (INDEPENDENT_AMBULATORY_CARE_PROVIDER_SITE_OTHER): Payer: Medicare (Managed Care)

## 2020-03-02 ENCOUNTER — Ambulatory Visit (INDEPENDENT_AMBULATORY_CARE_PROVIDER_SITE_OTHER): Payer: Medicare (Managed Care) | Admitting: Podiatry

## 2020-03-02 ENCOUNTER — Encounter: Payer: Self-pay | Admitting: Podiatry

## 2020-03-02 DIAGNOSIS — M79672 Pain in left foot: Secondary | ICD-10-CM

## 2020-03-02 DIAGNOSIS — D169 Benign neoplasm of bone and articular cartilage, unspecified: Secondary | ICD-10-CM

## 2020-03-02 DIAGNOSIS — L84 Corns and callosities: Secondary | ICD-10-CM | POA: Diagnosis not present

## 2020-03-02 DIAGNOSIS — D689 Coagulation defect, unspecified: Secondary | ICD-10-CM

## 2020-03-02 DIAGNOSIS — M79671 Pain in right foot: Secondary | ICD-10-CM

## 2020-03-02 DIAGNOSIS — M779 Enthesopathy, unspecified: Secondary | ICD-10-CM | POA: Diagnosis not present

## 2020-03-02 DIAGNOSIS — M109 Gout, unspecified: Secondary | ICD-10-CM | POA: Diagnosis not present

## 2020-03-02 MED ORDER — TRIAMCINOLONE ACETONIDE 10 MG/ML IJ SUSP
10.0000 mg | Freq: Once | INTRAMUSCULAR | Status: AC
  Administered 2020-03-02: 10 mg

## 2020-03-02 NOTE — Telephone Encounter (Signed)
Spoke with pt and made him aware of information from Dr. Tamala Julian and EP team.  Advised I will send message to Cardiac Rehab.  Pt very eager to get started back.

## 2020-03-02 NOTE — Progress Notes (Signed)
Subjective:   Patient ID: Raymond Christensen, male   DOB: 80 y.o.   MRN: [id number]   HPI Patient presents with numerous different problems with a painful lesion left fifth toe with keratotic tissue and on the right foot swelling of the fifth MPJ with history of gout that was tentatively diagnosed.  Patient is dealt with gout in the past but not for a long time and does not smoke   ROS      Objective:  Physical Exam  Neurovascular status found to be intact with patient found to have inflammation redness around the fifth MPJ right and exostotic lesion digit 5 left with pain and keratotic tissue formation.  Patient does not have a very high education level on gout current time     Assessment:  Probability for gout which has occurred right with several other possible attacks with inflammatory capsulitis fifth MPJ and lesion fifth digit left keratotic tissue formation     Plan:  H&P reviewed both conditions.  For the right I did do sterile prep and injected the fifth MPJ 3 mg Dexasone Kenalog 5 mg Xylocaine and for the left I debrided the lesion applied padding iatrogenic bleeding reappoint routine care and I discussed both conditions educating him on gout and what to do for this including diet and possible meds for the future  X-rays are negative for signs of fracture indicated exostotic lesion digit 5 left inner side

## 2020-03-02 NOTE — Patient Instructions (Signed)
Gout  Gout is painful swelling of your joints. Gout is a type of arthritis. It is caused by having too much uric acid in your body. Uric acid is a chemical that is made when your body breaks down substances called purines. If your body has too much uric acid, sharp crystals can form and build up in your joints. This causes pain and swelling. Gout attacks can happen quickly and be very painful (acute gout). Over time, the attacks can affect more joints and happen more often (chronic gout). What are the causes?  Too much uric acid in your blood. This can happen because: ? Your kidneys do not remove enough uric acid from your blood. ? Your body makes too much uric acid. ? You eat too many foods that are high in purines. These foods include organ meats, some seafood, and beer.  Trauma or stress. What increases the risk?  Having a family history of gout.  Being male and middle-aged.  Being male and having gone through menopause.  Being very overweight (obese).  Drinking alcohol, especially beer.  Not having enough water in the body (being dehydrated).  Losing weight too quickly.  Having an organ transplant.  Having lead poisoning.  Taking certain medicines.  Having kidney disease.  Having a skin condition called psoriasis. What are the signs or symptoms? An attack of acute gout usually happens in just one joint. The most common place is the big toe. Attacks often start at night. Other joints that may be affected include joints of the feet, ankle, knee, fingers, wrist, or elbow. Symptoms of an attack may include:  Very bad pain.  Warmth.  Swelling.  Stiffness.  Shiny, red, or purple skin.  Tenderness. The affected joint may be very painful to touch.  Chills and fever. Chronic gout may cause symptoms more often. More joints may be involved. You may also have white or yellow lumps (tophi) on your hands or feet or in other areas near your joints.   How is this  treated?  Treatment for this condition has two phases: treating an acute attack and preventing future attacks.  Acute gout treatment may include: ? NSAIDs. ? Steroids. These are taken by mouth or injected into a joint. ? Colchicine. This medicine relieves pain and swelling. It can be given by mouth or through an IV tube.  Preventive treatment may include: ? Taking small doses of NSAIDs or colchicine daily. ? Using a medicine that reduces uric acid levels in your blood. ? Making changes to your diet. You may need to see a food expert (dietitian) about what to eat and drink to prevent gout. Follow these instructions at home: During a gout attack  If told, put ice on the painful area: ? Put ice in a plastic bag. ? Place a towel between your skin and the bag. ? Leave the ice on for 20 minutes, 2-3 times a day.  Raise (elevate) the painful joint above the level of your heart as often as you can.  Rest the joint as much as possible. If the joint is in your leg, you may be given crutches.  Follow instructions from your doctor about what you cannot eat or drink.   Avoiding future gout attacks  Eat a low-purine diet. Avoid foods and drinks such as: ? Liver. ? Kidney. ? Anchovies. ? Asparagus. ? Herring. ? Mushrooms. ? Mussels. ? Beer.  Stay at a healthy weight. If you want to lose weight, talk with your doctor. Do   not lose weight too fast.  Start or continue an exercise plan as told by your doctor. Eating and drinking  Drink enough fluids to keep your pee (urine) pale yellow.  If you drink alcohol: ? Limit how much you use to:  0-1 drink a day for women.  0-2 drinks a day for men. ? Be aware of how much alcohol is in your drink. In the [place] one drink equals one 12 oz bottle of beer (355 mL), one 5 oz glass of wine (148 mL), or one 1 oz glass of hard liquor (44 mL). General instructions  Take over-the-counter and prescription medicines only as told by your doctor.  Do  not drive or use heavy machinery while taking prescription pain medicine.  Return to your normal activities as told by your doctor. Ask your doctor what activities are safe for you.  Keep all follow-up visits as told by your doctor. This is important. Contact a doctor if:  You have another gout attack.  You still have symptoms of a gout attack after 10 days of treatment.  You have problems (side effects) because of your medicines.  You have chills or a fever.  You have burning pain when you pee (urinate).  You have pain in your lower back or belly. Get help right away if:  You have very bad pain.  Your pain cannot be controlled.  You cannot pee. Summary  Gout is painful swelling of the joints.  The most common site of pain is the big toe, but it can affect other joints.  Medicines and avoiding some foods can help to prevent and treat gout attacks. This information is not intended to replace advice given to you by your health care provider. Make sure you discuss any questions you have with your health care provider. Document Revised: 07/23/2017 Document Reviewed: 07/23/2017 Elsevier Patient Education  2021 Elsevier Inc.  

## 2020-03-03 ENCOUNTER — Ambulatory Visit: Payer: Medicare (Managed Care) | Admitting: Family Medicine

## 2020-03-03 ENCOUNTER — Other Ambulatory Visit: Payer: Self-pay

## 2020-03-03 ENCOUNTER — Telehealth: Payer: Self-pay

## 2020-03-03 ENCOUNTER — Encounter: Payer: Self-pay | Admitting: Family Medicine

## 2020-03-03 VITALS — BP 124/66 | HR 56 | Temp 97.8°F | Ht 68.0 in | Wt 236.2 lb

## 2020-03-03 DIAGNOSIS — M79671 Pain in right foot: Secondary | ICD-10-CM

## 2020-03-03 LAB — URIC ACID: Uric Acid, Serum: 6.5 mg/dL (ref 4.0–7.8)

## 2020-03-03 MED ORDER — PREDNISONE 20 MG PO TABS
40.0000 mg | ORAL_TABLET | Freq: Every day | ORAL | 0 refills | Status: AC
Start: 2020-03-03 — End: 2020-03-08

## 2020-03-03 NOTE — Progress Notes (Signed)
Musculoskeletal Exam  Patient: Raymond Christensen DOB: 07-13-1940  DOS: 03/03/2020  SUBJECTIVE:  Chief Complaint:   Chief Complaint  Patient presents with  . Foot Pain    Right foot pain     Raymond Christensen is a 80 y.o.  male for evaluation and treatment of R foot pain.   Onset:  1 day ago. Received steroid injection for gout flare. Was walking more 3 d ago. No injury,  Location: R 5th MTP on dorsum Character:  aching  Progression of issue:  is unchanged Associated symptoms: swelling, redness Treatment: to date has been none.   Neurovascular symptoms: no  Past Medical History:  Diagnosis Date  . Arthritis   . CAD (coronary artery disease)   . Carotid artery disease ([place])    s/p left CEA 01/17/14  . Chronic systolic CHF (congestive heart failure) ([place])   . Concussion Summer 2012  . Essential hypertension 02/26/2017  . Heart disease   . Hyperlipidemia   . Hypothyroidism 10/15/2017  . Major depressive disorder   . Mitral valve regurgitation   . Myocardial infarction ([place]) 1996, 2021  . Obstructive sleep apnea on CPAP   . Persistent atrial fibrillation ([place])   . Poor flexibility of tendon 12/01/2018  . 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 Burt Knack  . Subungual hematoma of digit of hand 12/01/2018  . Subungual hematoma of right foot 12/01/2018  . Thyroid disease   . Type 2 diabetes mellitus without complication, without long-term current use of insulin ([place]) 02/26/2017    Objective: VITAL SIGNS: BP 124/66 (BP Location: Left Arm, Patient Position: Sitting, Cuff Size: Normal)   Pulse (!) 56   Temp 97.8 F (36.6 C) (Oral)   Ht 5\' 8"  (1.727 m)   Wt 236 lb 4 oz (107.2 kg)   SpO2 97%   BMI 35.92 kg/m  Constitutional: Well formed, well developed. No acute distress. Thorax & Lungs: No accessory muscle use Musculoskeletal: R foot.   Tenderness to palpation: 5th MTP ttp Deformity: no Ecchymosis: no +erythema and  warmth Neurologic: Normal sensory function. Antalgic gait.  Psychiatric: Normal mood. Age appropriate judgment and insight. Alert & oriented x 3.    Assessment:  Right foot pain - Plan: Uric acid  Plan:  Could be overuse from walking, post steroid flare, gout flare. Ck urate. Pred burst 5 d for 40 mg/d. Ice.  F/u as originally scheduled. The patient voiced understanding and agreement to the plan.   [place], DO 03/03/20  1:34 PM

## 2020-03-03 NOTE — Telephone Encounter (Signed)
This pt was in the off yesterday and received an injection for gout. Pt states his foot is hurting today, very swollen and red. Please advise

## 2020-03-03 NOTE — Patient Instructions (Signed)
Ice/cold pack over area for 10-15 min twice daily.  OK to take Tylenol 1000 mg (2 extra strength tabs) or 975 mg (3 regular strength tabs) every 6 hours as needed.  Activity as tolerated.  Let us know if you need anything.

## 2020-03-04 ENCOUNTER — Other Ambulatory Visit: Payer: Self-pay | Admitting: Cardiology

## 2020-03-06 ENCOUNTER — Other Ambulatory Visit: Payer: Self-pay | Admitting: Podiatry

## 2020-03-06 MED ORDER — COLCHICINE 0.6 MG PO TABS: 0.6000 mg | ORAL_TABLET | Freq: Two times a day (BID) | ORAL | 1 refills | Status: DC

## 2020-03-06 NOTE — Telephone Encounter (Signed)
Sent in a prescription

## 2020-03-06 NOTE — Telephone Encounter (Signed)
Thank you :)

## 2020-03-09 ENCOUNTER — Telehealth (HOSPITAL_COMMUNITY): Payer: Self-pay

## 2020-03-09 ENCOUNTER — Encounter (HOSPITAL_COMMUNITY): Payer: Self-pay

## 2020-03-09 NOTE — Telephone Encounter (Signed)
Pt returned CR phone and stated he is interested in CR. Asked pt about issues with L foot, he stated his doctor think it is gout and he is on medication for it now. And that he can walk and apply pressure to his L foot.  Patient will come in for orientation on 04/18/20 @ 1:15PM and will attend the 8:45AM exercise class. Went over insurance, patient verbalized understanding.  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

## 2020-03-09 NOTE — Telephone Encounter (Signed)
Pt insurance is active and benefits verified through [employer]. Co-pay $15.00, DED $0.00/$0.00 met, out of pocket $4,000.00/$105.00 met, co-insurance 0%. No pre-authorization required. Carolynn K./[employer], 03/09/20 @ 2:32PM, REF#[id number]

## 2020-03-09 NOTE — Telephone Encounter (Signed)
Attempted to call patient in regards to Cardiac Rehab - LM on VM Mailed letter 

## 2020-03-12 DIAGNOSIS — I25700 Atherosclerosis of coronary artery bypass graft(s), unspecified, with unstable angina pectoris: Secondary | ICD-10-CM

## 2020-03-13 ENCOUNTER — Telehealth (HOSPITAL_COMMUNITY): Payer: Self-pay | Admitting: Family Medicine

## 2020-03-15 ENCOUNTER — Encounter: Payer: Self-pay | Admitting: Podiatry

## 2020-03-15 ENCOUNTER — Ambulatory Visit (INDEPENDENT_AMBULATORY_CARE_PROVIDER_SITE_OTHER): Payer: Medicare (Managed Care) | Admitting: Podiatry

## 2020-03-15 ENCOUNTER — Other Ambulatory Visit: Payer: Self-pay

## 2020-03-15 DIAGNOSIS — L84 Corns and callosities: Secondary | ICD-10-CM | POA: Diagnosis not present

## 2020-03-15 DIAGNOSIS — D169 Benign neoplasm of bone and articular cartilage, unspecified: Secondary | ICD-10-CM | POA: Diagnosis not present

## 2020-03-15 DIAGNOSIS — D689 Coagulation defect, unspecified: Secondary | ICD-10-CM

## 2020-03-15 NOTE — Progress Notes (Signed)
Subjective:   Patient ID: Raymond Christensen, male   DOB: 80 y.o.   MRN: [id number]   HPI Patient presents stating this little corn is killing me between the toes I need it fixed   ROS      Objective:  Physical Exam  Neurovascular status intact with keratotic lesion inner side fifth digit left painful when pressed pressing against the fourth toe with patient also noted to have no drainage or other pathology     Assessment:  Acute exostosis left fifth digit with patient who is on Eliquis and Plavix and has a lesion associated with this     Plan:  H&P reviewed condition I do think exostectomy would be in his best interest and explained procedure risk patient wants surgery he signed consent form after review understanding is no long-term guarantees scheduled for surgery be done in the office.  Today I debrided the lesion and applied padding until were able to do the surgery and encouraged him to call questions concerns prior to procedure

## 2020-03-16 ENCOUNTER — Telehealth: Payer: Self-pay | Admitting: Podiatry

## 2020-03-16 ENCOUNTER — Telehealth: Payer: Self-pay | Admitting: Interventional Cardiology

## 2020-03-16 NOTE — Telephone Encounter (Signed)
Patient called and stated  Dr. Tamala Julian wanted Dr. Paulla Dolly to fax [phone]) what sx procedure is he going to preform and what anesthesia he is going to use to determine if Eliquis could be discontinued before sx is done.

## 2020-03-16 NOTE — Telephone Encounter (Signed)
   [facility] Pre-operative Risk Assessment    HEARTCARE STAFF: - Please ensure there is not already an duplicate clearance open for this procedure. - Under Visit Info/Reason for Call, type in Other and utilize the format Clearance MM/DD/YY or Clearance TBD. Do not use dashes or single digits. - If request is for dental extraction, please clarify the # of teeth to be extracted.  Request for surgical clearance:  1. What type of surgery is being performed?  exostectomy on fifth toe left foot, 10 minute in office procedure  2. When is this surgery scheduled? TBD  3. What type of clearance is required (medical clearance vs. Pharmacy clearance to hold med vs. Both)? Pharmacy   4. Are there any medications that need to be held prior to surgery and how long? Eliquis, leaving up to cardiology  5. Practice name and name of physician performing surgery? [facility], Dr. Ila Mcgill   6. What is the office phone number? [phone]   7.   What is the office fax number? [phone]  8.   Anesthesia type (None, local, MAC, general) ? local   Raymond Christensen 03/16/2020, 3:32 PM  _________________________________________________________________   (provider comments below)

## 2020-03-17 NOTE — Telephone Encounter (Signed)
   Primary Cardiologist: Sinclair Grooms, MD  Chart reviewed as part of pre-operative protocol coverage. Pharmacy clearance only requested. Per pharmacy review, Mr.  Plez Belton may hold Eliquis1 day prior to planned procedure.   I will route this recommendation to the requesting party via Epic fax function and remove from pre-op pool.  Please call with questions.  Loel Dubonnet, NP 03/17/2020, 1:08 PM

## 2020-03-17 NOTE — Telephone Encounter (Signed)
Patient with diagnosis of afib on Eliquis for anticoagulation.    Procedure: exostectomy on fifth toe left foot (10 minute in office procedure) Date of procedure: TBD  CHA2DS2-VASc Score = 6  This indicates a 9.7% annual risk of stroke. The patient's score is based upon: CHF History: Yes HTN History: Yes Diabetes History: Yes Stroke History: No Vascular Disease History: Yes Age Score: 2 Gender Score: 0   CrCl 55 using adjusted body weight Platelet count 148K  Per office protocol, patient can hold Eliquis for 1 day prior to procedure.    Patient should restart Eliquis on the evening of procedure or day after, at discretion of procedure MD

## 2020-03-21 ENCOUNTER — Telehealth: Payer: Self-pay | Admitting: Urology

## 2020-03-21 NOTE — Telephone Encounter (Signed)
DOS: 03/27/20  EXOSTECTOMY 5TH LEFT-- 28108   SPOKE WITH AMANDA WITH [employer] AND SHE STATED THAT NO PRIOR AUTH WAS NEEDED FOR CPT CODE 87215.  REF# [id number]

## 2020-03-27 ENCOUNTER — Other Ambulatory Visit: Payer: Self-pay

## 2020-03-27 ENCOUNTER — Encounter: Payer: Self-pay | Admitting: Podiatry

## 2020-03-27 ENCOUNTER — Ambulatory Visit: Payer: Medicare (Managed Care) | Admitting: Podiatry

## 2020-03-27 VITALS — BP 122/61 | HR 60 | Temp 97.2°F | Resp 16

## 2020-03-27 DIAGNOSIS — D169 Benign neoplasm of bone and articular cartilage, unspecified: Secondary | ICD-10-CM | POA: Diagnosis not present

## 2020-03-27 NOTE — Progress Notes (Signed)
Subjective:   Patient ID: Raymond Christensen, male   DOB: 80 y.o.   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  Following procedure was performed.  Exostectomy digit 5 left answers directed to the medial digit 5 left were some elliptical incision was made over the keratotic tissue formation.  The incision was deepened down through capsule and down to bone and the intervening skin wedge was removed in toto.  I exposed the bone and utilizing a sidecutting bur I created a pulp of the area to remove all bone and created a bone paste which I then cleared out flushed and found to be satisfactory.  I sutured with 5-0 nylon and sterile dressing applied tourniquet released capillary fill noted immediate and patient left the OR in satisfactory condition with surgical shoe

## 2020-03-28 LAB — HM DIABETES EYE EXAM

## 2020-04-10 ENCOUNTER — Other Ambulatory Visit: Payer: Self-pay

## 2020-04-10 ENCOUNTER — Ambulatory Visit (INDEPENDENT_AMBULATORY_CARE_PROVIDER_SITE_OTHER): Payer: Medicare (Managed Care) | Admitting: Podiatry

## 2020-04-10 ENCOUNTER — Ambulatory Visit (INDEPENDENT_AMBULATORY_CARE_PROVIDER_SITE_OTHER): Payer: Medicare (Managed Care)

## 2020-04-10 ENCOUNTER — Encounter: Payer: Self-pay | Admitting: Podiatry

## 2020-04-10 DIAGNOSIS — D169 Benign neoplasm of bone and articular cartilage, unspecified: Secondary | ICD-10-CM

## 2020-04-12 NOTE — Progress Notes (Signed)
Subjective:   Patient ID: Raymond Christensen, male   DOB: 80 y.o.   MRN: [id number]   HPI Patient states doing well with surgery very pleased with minimal discomfort   ROS      Objective:  Physical Exam  Neurovascular status intact stitches intact fifth toe wound edges well coapted no drainage     Assessment:  Doing well post exostectomy fifth digit left     Plan:  X-ray taken H&P done stitches removed dressing applied continue open toed shoes for the next several weeks and reappoint if any issues were to occur  X-rays indicate that there is satisfactory resection of bone of the fifth digit left good alignment noted

## 2020-04-13 ENCOUNTER — Telehealth (HOSPITAL_COMMUNITY): Payer: Self-pay

## 2020-04-14 ENCOUNTER — Telehealth (HOSPITAL_COMMUNITY): Payer: Self-pay | Admitting: Pharmacist

## 2020-04-14 NOTE — Telephone Encounter (Signed)
Cardiac Rehab Medication Review by a Pharmacist  Does the patient  feel that his/her medications are working for him/her?  yes  Has the patient been experiencing any side effects to the medications prescribed?  no  Does the patient measure his/her own blood pressure or blood glucose at home?  yes   Does the patient have any problems obtaining medications due to transportation or finances?   no  Understanding of regimen: good Understanding of indications: good Potential of compliance: good    Pharmacist Intervention: Patient indicated he check his BP rarely. It said it has been running approximately 115/60. Patient also endorsed taking tamsulosin twice daily which was started about a week ago by his physician. Offered to answer any medication questions and the patient indicated he had none.     [name], PharmD, [facility] Resident [phone] 04/14/2020 4:14 PM

## 2020-04-17 ENCOUNTER — Telehealth (HOSPITAL_COMMUNITY): Payer: Self-pay

## 2020-04-18 ENCOUNTER — Other Ambulatory Visit: Payer: Self-pay

## 2020-04-18 ENCOUNTER — Encounter (HOSPITAL_COMMUNITY): Payer: Self-pay

## 2020-04-18 ENCOUNTER — Encounter (HOSPITAL_COMMUNITY)
Admission: RE | Admit: 2020-04-18 | Discharge: 2020-04-18 | Disposition: A | Discharge status: Home / Self Care | Payer: Medicare (Managed Care) | Source: Ambulatory Visit | Attending: Cardiovascular Disease | Admitting: Cardiovascular Disease

## 2020-04-18 VITALS — BP 98/65 | HR 61 | Ht 66.5 in | Wt 238.1 lb

## 2020-04-18 DIAGNOSIS — Z9889 Other specified postprocedural states: Secondary | ICD-10-CM

## 2020-04-18 DIAGNOSIS — I214 Non-ST elevation (NSTEMI) myocardial infarction: Secondary | ICD-10-CM | POA: Insufficient documentation

## 2020-04-18 DIAGNOSIS — Z955 Presence of coronary angioplasty implant and graft: Secondary | ICD-10-CM | POA: Insufficient documentation

## 2020-04-18 NOTE — Progress Notes (Signed)
Cardiac Individual Treatment Plan  Patient Details  Name: Raymond Christensen MRN: [id number] Date of Birth: 08-06-1940 Referring Provider:   Flowsheet Row CARDIAC REHAB PHASE II ORIENTATION from 04/18/2020 in [place]  Referring Provider [name], MD      Initial Encounter Date:  [place] PHASE II ORIENTATION from 04/18/2020 in [place]  Date 04/18/20      Visit Diagnosis: 12/23/19 S/P Mitraclip, S/P Ablation 02/01/20  Patient's Home Medications on Admission:  Current Outpatient Medications:  .  acetaminophen (TYLENOL) 500 MG tablet, Take 1,000 mg by mouth every 6 (six) hours as needed for moderate pain or headache., Disp: , Rfl:  .  amiodarone (PACERONE) 200 MG tablet, Take 1 tablet (200 mg total) by mouth daily., Disp: , Rfl:  .  amoxicillin (AMOXIL) 500 MG tablet, Take 4 tablets (2,000 mg total) one hour prior to all dental visits. (Patient not taking: Reported on 04/14/2020), Disp: 8 tablet, Rfl: 11 .  apixaban (ELIQUIS) 5 MG TABS tablet, Take 1 tablet (5 mg total) by mouth 2 (two) times daily., Disp: 180 tablet, Rfl: 3 .  atorvastatin (LIPITOR) 80 MG tablet, Take 1 tablet (80 mg total) by mouth daily., Disp: 90 tablet, Rfl: 1 .  buPROPion (WELLBUTRIN XL) 150 MG 24 hr tablet, Take 1 tablet (150 mg total) by mouth daily., Disp: 30 tablet, Rfl: 3 .  carvedilol (COREG) 6.25 MG tablet, Take 1 tablet (6.25 mg total) by mouth 2 (two) times daily with a meal., Disp: 180 tablet, Rfl: 3 .  Cholecalciferol (VITAMIN D3) 250 MCG (10000 UT) capsule, Take 10,000 Units by mouth daily. , Disp: , Rfl:  .  clopidogrel (PLAVIX) 75 MG tablet, TAKE 1 TABLET DAILY WITH   BREAKFAST, Disp: 30 tablet, Rfl: 3 .  colchicine 0.6 MG tablet, Take 1 tablet (0.6 mg total) by mouth 2 (two) times daily. (Patient not taking: Reported on 04/14/2020), Disp: 30 tablet, Rfl: 1 .  ezetimibe (ZETIA) 10 MG tablet, Take 1 tablet (10 mg total) by  mouth daily., Disp: 90 tablet, Rfl: 1 .  famotidine (PEPCID) 20 MG tablet, Take 1 tablet (20 mg total) by mouth daily., Disp: 30 tablet, Rfl: 3 .  finasteride (PROSCAR) 5 MG tablet, TAKE 1 TABLET DAILY, Disp: 90 tablet, Rfl: 1 .  furosemide (LASIX) 40 MG tablet, Take 1 tablet (40 mg total) by mouth daily., Disp: 90 tablet, Rfl: 3 .  levocetirizine (XYZAL) 5 MG tablet, TAKE 1 TABLET EVERY EVENING, Disp: 90 tablet, Rfl: 1 .  levothyroxine (SYNTHROID) 75 MCG tablet, Take 1 tablet (75 mcg total) by mouth daily., Disp: 90 tablet, Rfl: 1 .  liothyronine (CYTOMEL) 5 MCG tablet, TAKE 2 TABLETS DAILY, Disp: 180 tablet, Rfl: 3 .  Multiple Vitamins-Minerals (MULTIVITAMIN ADULT EXTRA C PO), Take 1 tablet by mouth daily. , Disp: , Rfl:  .  nitroGLYCERIN (NITROSTAT) 0.4 MG SL tablet, Place 1 tablet (0.4 mg total) under the tongue every 5 (five) minutes as needed for chest pain., Disp: 30 tablet, Rfl: 12 .  omega-3 acid ethyl esters (LOVAZA) 1 g capsule, TAKE 2 CAPSULES TWICE DAILY, Disp: 360 capsule, Rfl: 2 .  potassium chloride (KLOR-CON) 10 MEQ tablet, TAKE 2 TABLETS DAILY AS    NEEDED WITH EVERY 40MG  DOSEOF LASIX, Disp: 90 tablet, Rfl: 1 .  sacubitril-valsartan (ENTRESTO) 24-26 MG, Take 1 tablet by mouth 2 (two) times daily., Disp: 180 tablet, Rfl: 3 .  spironolactone (ALDACTONE) 25 MG tablet, Take  0.5 tablets (12.5 mg total) by mouth daily., Disp: 135 tablet, Rfl: 3 .  tamsulosin (FLOMAX) 0.4 MG CAPS capsule, TAKE 1 CAPSULE DAILY, Disp: 90 capsule, Rfl: 3 .  vitamin B-12 (CYANOCOBALAMIN) 1000 MCG tablet, Take 1,000 mcg by mouth daily., Disp: , Rfl:  .  zinc gluconate 50 MG tablet, Take 50 mg by mouth daily., Disp: , Rfl:   Past Medical History: Past Medical History:  Diagnosis Date  . Arthritis   . CAD (coronary artery disease)   . Carotid artery disease ([place])    s/p left CEA 01/17/14  . Chronic systolic CHF (congestive heart failure) ([place])   . Concussion Summer 2012  . Essential hypertension 02/26/2017   . Heart disease   . Hyperlipidemia   . Hypothyroidism 10/15/2017  . Major depressive disorder   . Mitral valve regurgitation   . Myocardial infarction ([place]) 1996, 2021  . Obstructive sleep apnea on CPAP   . Persistent atrial fibrillation ([place])   . Poor flexibility of tendon 12/01/2018  . 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 Burt Knack  . Subungual hematoma of digit of hand 12/01/2018  . Subungual hematoma of right foot 12/01/2018  . Thyroid disease   . Type 2 diabetes mellitus without complication, without long-term current use of insulin ([place]) 02/26/2017    Tobacco Use: Social History   Tobacco Use  Smoking Status Former Smoker  . Types: Cigarettes  . Quit date: 38  . Years since quitting: 42.2  Smokeless Tobacco Never Used    Labs: Recent Chemical engineer    Labs for ITP Cardiac and Pulmonary Rehab Latest Ref Rng & Units 09/08/2019 09/09/2019 09/09/2019 09/09/2019 12/21/2019   Cholestrol 0 - 200 mg/dL 135 - - - -   LDLCALC 0 - 99 mg/dL 76 - - - -   LDLDIRECT mg/dL - - - - -   HDL >40 mg/dL 42 - - - -   Trlycerides <150 mg/dL 85 - - - -   Hemoglobin A1c 4.8 - 5.6 % - - - - 6.7(H)   PHART 7.350 - 7.450 - - - 7.386 7.437   PCO2ART 32.0 - 48.0 mmHg - - - 49.2(H) 36.9   HCO3 20.0 - 28.0 mmol/L - 30.1(H) 29.9(H) 29.5(H) 24.4   TCO2 22 - 32 mmol/L - 32 31 31 -   O2SAT % - 70.0 71.0 100.0 97.2      Capillary Blood Glucose: Lab Results  Component Value Date   GLUCAP 122 (H) 02/01/2020   GLUCAP 121 (H) 02/01/2020   GLUCAP 184 (H) 12/23/2019   GLUCAP 182 (H) 12/23/2019   GLUCAP 123 (H) 12/23/2019     Exercise Target Goals: Exercise Program Goal: Individual exercise prescription set using results from initial 6 min walk test and THRR while considering  patient's activity barriers and safety.   Exercise Prescription Goal: Starting with aerobic activity 30 plus minutes a day, 3 days per week for initial exercise  prescription. Provide home exercise prescription and guidelines that participant acknowledges understanding prior to discharge.  Activity Barriers & Risk Stratification:  Activity Barriers & Cardiac Risk Stratification - 04/18/20 1426      Activity Barriers & Cardiac Risk Stratification   Activity Barriers Balance Concerns    Cardiac Risk Stratification High           6 Minute Walk:  6 Minute Walk    Row Name 04/18/20 1425  6 Minute Walk   Phase Initial     Distance 1443 feet     Walk Time 6 minutes     # of Rest Breaks 0     MPH 2.73     METS 2.21     RPE 10     Perceived Dyspnea  0     VO2 Peak 7.73     Symptoms No     Resting HR 57 bpm     Resting BP 98/65     Resting Oxygen Saturation  97 %     Exercise Oxygen Saturation  during 6 min walk 98 %     Max Ex. HR 96 bpm     Max Ex. BP 150/70     2 Minute Post BP 127/67            Oxygen Initial Assessment:   Oxygen Re-Evaluation:   Oxygen Discharge (Final Oxygen Re-Evaluation):   Initial Exercise Prescription:  Initial Exercise Prescription - 04/18/20 1400      Date of Initial Exercise RX and Referring Provider   Date 04/18/20    Referring Provider [name], MD    Expected Discharge Date 06/16/20      NuStep   Level 2    SPM 75    Minutes 15    METs 2      Arm Ergometer   Level 1.8    Minutes 15    METs 2      Prescription Details   Frequency (times per week) 3    Duration Progress to 30 minutes of continuous aerobic without signs/symptoms of physical distress      Intensity   THRR 40-80% of Max Heartrate 57-114    Ratings of Perceived Exertion 11-13    Perceived Dyspnea 0-4      Progression   Progression Continue progressive overload as per policy without signs/symptoms or physical distress.      Resistance Training   Training Prescription Yes    Weight 3 lbs    Reps 10-15           Perform Capillary Blood Glucose checks as needed.  Exercise Prescription  Changes:   Exercise Comments:   Exercise Goals and Review:  Exercise Goals    Row Name 04/18/20 1431             Exercise Goals   Increase Physical Activity Yes       Intervention Provide advice, education, support and counseling about physical activity/exercise needs.;Develop an individualized exercise prescription for aerobic and resistive training based on initial evaluation findings, risk stratification, comorbidities and participant's personal goals.       Expected Outcomes Short Term: Attend rehab on a regular basis to increase amount of physical activity.;Long Term: Add in home exercise to make exercise part of routine and to increase amount of physical activity.;Long Term: Exercising regularly at least 3-5 days a week.       Increase Strength and Stamina Yes       Intervention Provide advice, education, support and counseling about physical activity/exercise needs.;Develop an individualized exercise prescription for aerobic and resistive training based on initial evaluation findings, risk stratification, comorbidities and participant's personal goals.       Expected Outcomes Short Term: Increase workloads from initial exercise prescription for resistance, speed, and METs.;Short Term: Perform resistance training exercises routinely during rehab and add in resistance training at home;Long Term: Improve cardiorespiratory fitness, muscular endurance and strength as measured by increased METs and functional  capacity (6MWT)       Able to understand and use rate of perceived exertion (RPE) scale Yes       Intervention Provide education and explanation on how to use RPE scale       Expected Outcomes Short Term: Able to use RPE daily in rehab to express subjective intensity level;Long Term:  Able to use RPE to guide intensity level when exercising independently       Knowledge and understanding of Target Heart Rate Range (THRR) Yes       Intervention Provide education and explanation of THRR  including how the numbers were predicted and where they are located for reference       Expected Outcomes Short Term: Able to state/look up THRR;Short Term: Able to use daily as guideline for intensity in rehab;Long Term: Able to use THRR to govern intensity when exercising independently       Understanding of Exercise Prescription Yes       Intervention Provide education, explanation, and written materials on patient's individual exercise prescription       Expected Outcomes Short Term: Able to explain program exercise prescription;Long Term: Able to explain home exercise prescription to exercise independently              Exercise Goals Re-Evaluation :    Discharge Exercise Prescription (Final Exercise Prescription Changes):   Nutrition:  Target Goals: Understanding of nutrition guidelines, daily intake of sodium 1500mg , cholesterol 200mg , calories 30% from fat and 7% or less from saturated fats, daily to have 5 or more servings of fruits and vegetables.  Biometrics:  Pre Biometrics - 04/18/20 1300      Pre Biometrics   Waist Circumference 47.5 inches    Hip Circumference 49 inches    Waist to Hip Ratio 0.97 %    Triceps Skinfold 15 mm    % Body Fat 35.5 %    Grip Strength 42 kg    Flexibility 0 in   Pt cannot reach   Single Leg Stand 2.68 seconds            Nutrition Therapy Plan and Nutrition Goals:   Nutrition Assessments:  MEDIFICTS Score Key:  ?70 Need to make dietary changes   40-70 Heart Healthy Diet  ? 40 Therapeutic Level Cholesterol Diet   Picture Your Plate Scores:  <40 Unhealthy dietary pattern with much room for improvement.  41-50 Dietary pattern unlikely to meet recommendations for good health and room for improvement.  51-60 More healthful dietary pattern, with some room for improvement.   >60 Healthy dietary pattern, although there may be some specific behaviors that could be improved.    Nutrition Goals Re-Evaluation:   Nutrition  Goals Discharge (Final Nutrition Goals Re-Evaluation):   Psychosocial: Target Goals: Acknowledge presence or absence of significant depression and/or stress, maximize coping skills, provide positive support system. Participant is able to verbalize types and ability to use techniques and skills needed for reducing stress and depression.  Initial Review & Psychosocial Screening:  Initial Psych Review & Screening - 04/18/20 1510      Initial Review   Current issues with None Identified;History of Depression      Family Dynamics   Good Support System? Yes   Siddharth has his wife for support   Comments Bruce denies being depressed. Jasim takes Wellbutrin      Barriers   Psychosocial barriers to participate in program There are no identifiable barriers or psychosocial needs.      Screening  Interventions   Interventions Encouraged to exercise    Expected Outcomes Long Term Goal: Stressors or current issues are controlled or eliminated.           Quality of Life Scores:  Quality of Life - 04/18/20 1424      Quality of Life   Select Quality of Life      Quality of Life Scores   Health/Function Pre 21.5 %    Socioeconomic Pre 27.92 %    Psych/Spiritual Pre 21.14 %    Family Pre 28.5 %    GLOBAL Pre 23.72 %          Scores of 19 and below usually indicate a poorer quality of life in these areas.  A difference of  2-3 points is a clinically meaningful difference.  A difference of 2-3 points in the total score of the Quality of Life Index has been associated with significant improvement in overall quality of life, self-image, physical symptoms, and general health in studies assessing change in quality of life.  PHQ-9: Recent Review Flowsheet Data    Depression screen [facility] 2/9 04/18/2020 04/18/2020 12/17/2019 10/21/2019 06/02/2019   Decreased Interest 0 0 0 0 1   Down, Depressed, Hopeless 0 0 0 1 1   PHQ - 2 Score 0 0 0 1 2   Altered sleeping - - - - 0   Tired, decreased energy - - -  - 1   Change in appetite - - - - 0   Feeling bad or failure about yourself  - - - - 0   Trouble concentrating - - - - 0   Moving slowly or fidgety/restless - - - - 0   Suicidal thoughts - - - - 0   PHQ-9 Score - - - - 3   Difficult doing work/chores - - - - Somewhat difficult     Interpretation of Total Score  Total Score Depression Severity:  1-4 = Minimal depression, 5-9 = Mild depression, 10-14 = Moderate depression, 15-19 = Moderately severe depression, 20-27 = Severe depression   Psychosocial Evaluation and Intervention:   Psychosocial Re-Evaluation:   Psychosocial Discharge (Final Psychosocial Re-Evaluation):   Vocational Rehabilitation: Provide vocational rehab assistance to qualifying candidates.   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 Participant will state understanding/return demonstration of topics presented.  Learning Barriers/Preferences:  Learning Barriers/Preferences - 04/18/20 1416      Learning Barriers/Preferences   Learning Barriers Hearing;Sight   Wears hearing aids and glasses   Learning Preferences Written Material;Video;Pictoral;Computer/Internet           Education Topics: Hypertension, Hypertension Reduction -Define heart disease and high blood pressure. Discus how high blood pressure affects the body and ways to reduce high blood pressure.   Exercise and Your Heart -Discuss why it is important to exercise, the FITT principles of exercise, normal and abnormal responses to exercise, and how to exercise safely.   Angina -Discuss definition of angina, causes of angina, treatment of angina,  and how to decrease risk of having angina.   Cardiac Medications -Review what the following cardiac medications are used for, how they  affect the body, and side effects that may occur when taking the medications.  Medications include Aspirin, Beta blockers, calcium channel blockers, ACE Inhibitors, angiotensin receptor blockers, diuretics, digoxin, and antihyperlipidemics.   Congestive Heart Failure -Discuss the definition of CHF, how to live with CHF, the signs and symptoms of CHF, and how keep track of weight and sodium intake.   Heart Disease and Intimacy -Discus the effect sexual activity has on the heart, how changes occur during intimacy as we age, and safety during sexual activity.   Smoking Cessation / COPD -Discuss different methods to quit smoking, the health benefits of quitting smoking, and the definition of COPD.   Nutrition I: Fats -Discuss the types of cholesterol, what cholesterol does to the heart, and how cholesterol levels can be controlled.   Nutrition II: Labels -Discuss the different components of food labels and how to read food label   Heart Parts/Heart Disease and PAD -Discuss the anatomy of the heart, the pathway of blood circulation through the heart, and these are affected by heart disease.   Stress I: Signs and Symptoms -Discuss the causes of stress, how stress may lead to anxiety and depression, and ways to limit stress.   Stress II: Relaxation -Discuss different types of relaxation techniques to limit stress.   Warning Signs of Stroke / TIA -Discuss definition of a stroke, what the signs and symptoms are of a stroke, and how to identify when someone is having stroke.   Knowledge Questionnaire Score:  Knowledge Questionnaire Score - 04/18/20 1416      Knowledge Questionnaire Score   Pre Score 21/24           Core Components/Risk Factors/Patient Goals at Admission:  Personal Goals and Risk Factors at Admission - 04/18/20 1420       Core Components/Risk Factors/Patient Goals on Admission    Weight Management Yes;Obesity;Weight Loss    Intervention Weight Management: Develop a combined nutrition and exercise program designed to reach desired caloric intake, while maintaining appropriate intake of nutrient and fiber, sodium and fats, and appropriate energy expenditure required for the weight goal.;Weight Management: Provide education and appropriate resources to help participant work on and attain dietary goals.;Weight Management/Obesity: Establish reasonable short term and long term weight goals.;Obesity: Provide education and appropriate resources to help participant work on and attain dietary goals.    Admit Weight 238 lb 1.6 oz (108 kg)    Expected Outcomes Short Term: Continue to assess and modify interventions until short term weight is achieved;Long Term: Adherence to nutrition and physical activity/exercise program aimed toward attainment of established weight goal;Weight Maintenance: Understanding of the daily nutrition guidelines, which includes 25-35% calories from fat, 7% or less cal from saturated fats, less than 200mg  cholesterol, less than 1.5gm of sodium, & 5 or more servings of fruits and vegetables daily;Weight Loss: Understanding of general recommendations for a balanced deficit meal plan, which promotes 1-2 lb weight loss per week and includes a negative energy balance of [phone] kcal/d;Understanding recommendations for meals to include 15-35% energy as protein, 25-35% energy from fat, 35-60% energy from carbohydrates, less than 200mg  of dietary cholesterol, 20-35 gm of total fiber daily;Understanding of distribution of calorie intake throughout the day with the consumption of 4-5 meals/snacks    Diabetes Yes    Intervention Provide education about signs/symptoms and action to take for hypo/hyperglycemia.;Provide education about proper nutrition, including hydration, and aerobic/resistive exercise prescription along  with prescribed medications to achieve blood glucose in normal ranges: Fasting glucose 65-99 mg/dL  Expected Outcomes Short Term: Participant verbalizes understanding of the signs/symptoms and immediate care of hyper/hypoglycemia, proper foot care and importance of medication, aerobic/resistive exercise and nutrition plan for blood glucose control.;Long Term: Attainment of HbA1C < 7%.    Heart Failure Yes    Intervention Provide a combined exercise and nutrition program that is supplemented with education, support and counseling about heart failure. Directed toward relieving symptoms such as shortness of breath, decreased exercise tolerance, and extremity edema.    Hypertension Yes    Intervention Monitor prescription use compliance.;Provide education on lifestyle modifcations including regular physical activity/exercise, weight management, moderate sodium restriction and increased consumption of fresh fruit, vegetables, and low fat dairy, alcohol moderation, and smoking cessation.    Expected Outcomes Short Term: Continued assessment and intervention until BP is < 140/39mm HG in hypertensive participants. < 130/36mm HG in hypertensive participants with diabetes, heart failure or chronic kidney disease.;Long Term: Maintenance of blood pressure at goal levels.    Lipids Yes    Intervention Provide education and support for participant on nutrition & aerobic/resistive exercise along with prescribed medications to achieve LDL 70mg , HDL >40mg .    Expected Outcomes Short Term: Participant states understanding of desired cholesterol values and is compliant with medications prescribed. Participant is following exercise prescription and nutrition guidelines.;Long Term: Cholesterol controlled with medications as prescribed, with individualized exercise RX and with personalized nutrition plan. Value goals: LDL < 70mg , HDL > 40 mg.           Core Components/Risk Factors/Patient Goals Review:    Core  Components/Risk Factors/Patient Goals at Discharge (Final Review):    ITP Comments:  ITP Comments    Row Name 04/18/20 1509           ITP Comments Dr Fransico Him MD, Medical Director              Comments: Raymond Christensen attended orientation on 04/18/2020 to review rules and guidelines for program.  Completed 6 minute walk test, Intitial ITP, and exercise prescription.  VSS. Telemetry-Sinus Rhythm first degree heart block occasional PAc.  Asymptomatic. Safety measures and social distancing in place per CDC guidelines.Barnet Pall, RN,BSN 04/18/2020 3:51 PM

## 2020-04-24 ENCOUNTER — Encounter (HOSPITAL_COMMUNITY)
Admission: RE | Admit: 2020-04-24 | Discharge: 2020-04-24 | Disposition: A | Discharge status: Home / Self Care | Payer: Medicare (Managed Care) | Source: Ambulatory Visit | Attending: Cardiovascular Disease | Admitting: Cardiovascular Disease

## 2020-04-24 ENCOUNTER — Other Ambulatory Visit: Payer: Medicare (Managed Care) | Admitting: *Deleted

## 2020-04-24 ENCOUNTER — Other Ambulatory Visit: Payer: Self-pay

## 2020-04-24 DIAGNOSIS — Z9889 Other specified postprocedural states: Secondary | ICD-10-CM | POA: Diagnosis not present

## 2020-04-24 DIAGNOSIS — I214 Non-ST elevation (NSTEMI) myocardial infarction: Secondary | ICD-10-CM | POA: Diagnosis present

## 2020-04-24 DIAGNOSIS — Z955 Presence of coronary angioplasty implant and graft: Secondary | ICD-10-CM | POA: Diagnosis present

## 2020-04-24 DIAGNOSIS — I25700 Atherosclerosis of coronary artery bypass graft(s), unspecified, with unstable angina pectoris: Secondary | ICD-10-CM

## 2020-04-24 LAB — LIPID PANEL
Chol/HDL Ratio: 2.6 ratio (ref 0.0–5.0)
Cholesterol, Total: 110 mg/dL (ref 100–199)
HDL: 42 mg/dL (ref >39)
LDL Chol Calc (NIH): 37 mg/dL (ref 0–99)
Triglycerides: 190 mg/dL — ABNORMAL HIGH (ref 0–149)
VLDL Cholesterol Cal: 31 mg/dL (ref 5–40)

## 2020-04-24 NOTE — Progress Notes (Signed)
Daily Session Note  Patient Details  Name: Raymond Christensen MRN: [id number] Date of Birth: June 29, 1940 Referring Provider:   Flowsheet Row CARDIAC REHAB PHASE II ORIENTATION from 04/18/2020 in [facility]  Referring Provider [name], MD      Encounter Date: 04/24/2020  Check In:  Session Check In - 04/24/20 0851      Check-In   Supervising physician immediately available to respond to emergencies [facility] immediately available    Physician(s) Dr Tawanna Solo    Location [facility]    Staff Present Barnet Pall, RN, Milus Glazier, MS, EP-C, CCRP;Carlette Wilber Oliphant, RN, Deland Pretty, MS, ACSM CEP, Exercise Physiologist;Other   Esmeralda Links, EP   Virtual Visit No    Medication changes reported     No    Fall or balance concerns reported    No    Tobacco Cessation No Change    Current number of cigarettes/nicotine per day     0    Warm-up and Cool-down Performed on first and last piece of equipment    Resistance Training Performed Yes    VAD Patient? No    PAD/SET Patient? No      Pain Assessment   Currently in Pain? No/denies    Pain Score 0-No pain    Multiple Pain Sites No           Capillary Blood Glucose: No results found for this or any previous visit (from the past 24 hour(s)).   Exercise Prescription Changes - 04/24/20 1000      Response to Exercise   Blood Pressure (Admit) 107/52    Blood Pressure (Exercise) 124/72    Blood Pressure (Exit) 102/58    Heart Rate (Admit) 72 bpm    Heart Rate (Exercise) 88 bpm    Heart Rate (Exit) 70 bpm    Rating of Perceived Exertion (Exercise) 11    Symptoms None    Comments Pt's first day of exercise in the CRP2 program    Duration Progress to 30 minutes of  aerobic without signs/symptoms of physical distress    Intensity THRR unchanged      Progression   Progression Continue to progress workloads to maintain intensity without signs/symptoms of physical  distress.    Average METs 2.1      Resistance Training   Training Prescription Yes    Weight 3 lbs    Reps 10-15    Time 10 Minutes      Interval Training   Interval Training No      NuStep   Level 2    SPM 75    Minutes 15    METs 2.3      Arm Ergometer   Level 1.5    Minutes 15    METs 1.8           Social History   Tobacco Use  Smoking Status Former Smoker  . Types: Cigarettes  . Quit date: 60  . Years since quitting: 42.3  Smokeless Tobacco Never Used    Goals Met:  Exercise tolerated well No report of cardiac concerns or symptoms Strength training completed today  Goals Unmet:  Not Applicable  Comments: Raymond Christensen started cardiac rehab today.  Pt tolerated light exercise without difficulty. VSS, telemetry-sinus rhythm, asymptomatic.  Medication list reconciled. Pt denies barriers to medicaiton compliance.  PSYCHOSOCIAL ASSESSMENT:  PHQ-0. Pt exhibits positive coping skills, hopeful outlook with supportive family. No psychosocial needs identified at this time, no psychosocial  interventions necessary.    Pt enjoys reading and exercise.   Pt oriented to exercise equipment and routine.    Understanding verbalized.Barnet Pall, RN,BSN 04/24/2020 12:19 PM   Dr. Fransico Him is Medical Director for Cardiac Rehab at [facility].

## 2020-04-26 ENCOUNTER — Encounter (HOSPITAL_COMMUNITY)
Admission: RE | Admit: 2020-04-26 | Discharge: 2020-04-26 | Disposition: A | Discharge status: Home / Self Care | Payer: Medicare (Managed Care) | Source: Ambulatory Visit | Attending: Cardiovascular Disease | Admitting: Cardiovascular Disease

## 2020-04-26 ENCOUNTER — Other Ambulatory Visit: Payer: Self-pay

## 2020-04-26 DIAGNOSIS — Z955 Presence of coronary angioplasty implant and graft: Secondary | ICD-10-CM

## 2020-04-26 DIAGNOSIS — Z9889 Other specified postprocedural states: Secondary | ICD-10-CM

## 2020-04-26 DIAGNOSIS — I214 Non-ST elevation (NSTEMI) myocardial infarction: Secondary | ICD-10-CM

## 2020-04-28 ENCOUNTER — Encounter (HOSPITAL_COMMUNITY)
Admission: RE | Admit: 2020-04-28 | Discharge: 2020-04-28 | Disposition: A | Discharge status: Home / Self Care | Payer: Medicare (Managed Care) | Source: Ambulatory Visit | Attending: Cardiovascular Disease | Admitting: Cardiovascular Disease

## 2020-04-28 ENCOUNTER — Other Ambulatory Visit: Payer: Self-pay

## 2020-04-28 DIAGNOSIS — Z9889 Other specified postprocedural states: Secondary | ICD-10-CM

## 2020-05-01 ENCOUNTER — Encounter (HOSPITAL_COMMUNITY)
Admission: RE | Admit: 2020-05-01 | Discharge: 2020-05-01 | Disposition: A | Discharge status: Home / Self Care | Payer: Medicare (Managed Care) | Source: Ambulatory Visit | Attending: Cardiovascular Disease | Admitting: Cardiovascular Disease

## 2020-05-01 ENCOUNTER — Other Ambulatory Visit: Payer: Self-pay

## 2020-05-01 VITALS — Wt 238.0 lb

## 2020-05-01 DIAGNOSIS — Z9889 Other specified postprocedural states: Secondary | ICD-10-CM

## 2020-05-01 NOTE — Progress Notes (Signed)
Raymond Christensen 80 y.o. male Nutrition Note  Diagnosis:12/23/19 S/P Mitraclip, S/P Ablation 02/01/20  Past Medical History:  Diagnosis Date  . Arthritis   . CAD (coronary artery disease)   . Carotid artery disease ([place])    s/p left CEA 01/17/14  . Chronic systolic CHF (congestive heart failure) ([place])   . Concussion Summer 2012  . Essential hypertension 02/26/2017  . Heart disease   . Hyperlipidemia   . Hypothyroidism 10/15/2017  . Major depressive disorder   . Mitral valve regurgitation   . Myocardial infarction ([place]) 1996, 2021  . Obstructive sleep apnea on CPAP   . Persistent atrial fibrillation ([place])   . Poor flexibility of tendon 12/01/2018  . 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 Burt Knack  . Subungual hematoma of digit of hand 12/01/2018  . Subungual hematoma of right foot 12/01/2018  . Thyroid disease   . Type 2 diabetes mellitus without complication, without long-term current use of insulin ([place]) 02/26/2017     Medications reviewed.   Current Outpatient Medications:  .  acetaminophen (TYLENOL) 500 MG tablet, Take 1,000 mg by mouth every 6 (six) hours as needed for moderate pain or headache., Disp: , Rfl:  .  amiodarone (PACERONE) 200 MG tablet, Take 1 tablet (200 mg total) by mouth daily., Disp: , Rfl:  .  amoxicillin (AMOXIL) 500 MG tablet, Take 4 tablets (2,000 mg total) one hour prior to all dental visits. (Patient not taking: Reported on 04/14/2020), Disp: 8 tablet, Rfl: 11 .  apixaban (ELIQUIS) 5 MG TABS tablet, Take 1 tablet (5 mg total) by mouth 2 (two) times daily., Disp: 180 tablet, Rfl: 3 .  atorvastatin (LIPITOR) 80 MG tablet, Take 1 tablet (80 mg total) by mouth daily., Disp: 90 tablet, Rfl: 1 .  buPROPion (WELLBUTRIN XL) 150 MG 24 hr tablet, Take 1 tablet (150 mg total) by mouth daily., Disp: 30 tablet, Rfl: 3 .  carvedilol (COREG) 6.25 MG tablet, Take 1 tablet (6.25 mg total) by mouth 2 (two) times daily with a  meal., Disp: 180 tablet, Rfl: 3 .  Cholecalciferol (VITAMIN D3) 250 MCG (10000 UT) capsule, Take 10,000 Units by mouth daily. , Disp: , Rfl:  .  clopidogrel (PLAVIX) 75 MG tablet, TAKE 1 TABLET DAILY WITH   BREAKFAST, Disp: 30 tablet, Rfl: 3 .  colchicine 0.6 MG tablet, Take 1 tablet (0.6 mg total) by mouth 2 (two) times daily. (Patient not taking: Reported on 04/14/2020), Disp: 30 tablet, Rfl: 1 .  ezetimibe (ZETIA) 10 MG tablet, Take 1 tablet (10 mg total) by mouth daily., Disp: 90 tablet, Rfl: 1 .  famotidine (PEPCID) 20 MG tablet, Take 1 tablet (20 mg total) by mouth daily., Disp: 30 tablet, Rfl: 3 .  finasteride (PROSCAR) 5 MG tablet, TAKE 1 TABLET DAILY, Disp: 90 tablet, Rfl: 1 .  furosemide (LASIX) 40 MG tablet, Take 1 tablet (40 mg total) by mouth daily., Disp: 90 tablet, Rfl: 3 .  levocetirizine (XYZAL) 5 MG tablet, TAKE 1 TABLET EVERY EVENING, Disp: 90 tablet, Rfl: 1 .  levothyroxine (SYNTHROID) 75 MCG tablet, Take 1 tablet (75 mcg total) by mouth daily., Disp: 90 tablet, Rfl: 1 .  liothyronine (CYTOMEL) 5 MCG tablet, TAKE 2 TABLETS DAILY, Disp: 180 tablet, Rfl: 3 .  Multiple Vitamins-Minerals (MULTIVITAMIN ADULT EXTRA C PO), Take 1 tablet by mouth daily. , Disp: , Rfl:  .  nitroGLYCERIN (NITROSTAT) 0.4 MG SL tablet, Place 1 tablet (0.4  mg total) under the tongue every 5 (five) minutes as needed for chest pain., Disp: 30 tablet, Rfl: 12 .  omega-3 acid ethyl esters (LOVAZA) 1 g capsule, TAKE 2 CAPSULES TWICE DAILY, Disp: 360 capsule, Rfl: 2 .  potassium chloride (KLOR-CON) 10 MEQ tablet, TAKE 2 TABLETS DAILY AS    NEEDED WITH EVERY 40MG  DOSEOF LASIX, Disp: 90 tablet, Rfl: 1 .  sacubitril-valsartan (ENTRESTO) 24-26 MG, Take 1 tablet by mouth 2 (two) times daily., Disp: 180 tablet, Rfl: 3 .  spironolactone (ALDACTONE) 25 MG tablet, Take 0.5 tablets (12.5 mg total) by mouth daily., Disp: 135 tablet, Rfl: 3 .  tamsulosin (FLOMAX) 0.4 MG CAPS capsule, TAKE 1 CAPSULE DAILY, Disp: 90 capsule, Rfl:  3 .  vitamin B-12 (CYANOCOBALAMIN) 1000 MCG tablet, Take 1,000 mcg by mouth daily., Disp: , Rfl:  .  zinc gluconate 50 MG tablet, Take 50 mg by mouth daily., Disp: , Rfl:    Ht Readings from Last 1 Encounters:  04/18/20 5' 6.5" (1.689 m)     Wt Readings from Last 3 Encounters:  04/18/20 238 lb 1.6 oz (108 kg)  03/03/20 236 lb 4 oz (107.2 kg)  02/29/20 237 lb 12.8 oz (107.9 kg)     There is no height or weight on file to calculate BMI.   Social History   Tobacco Use  Smoking Status Former Smoker  . Types: Cigarettes  . Quit date: 68  . Years since quitting: 42.3  Smokeless Tobacco Never Used     Lab Results  Component Value Date   CHOL 110 04/24/2020   Lab Results  Component Value Date   HDL 42 04/24/2020   Lab Results  Component Value Date   LDLCALC 37 04/24/2020   Lab Results  Component Value Date   TRIG 190 (H) 04/24/2020     Lab Results  Component Value Date   HGBA1C 6.7 (H) 12/21/2019     CBG (last 3)  No results for input(s): GLUCAP in the last 72 hours.   Nutrition Note  Spoke with pt. Nutrition Plan and Nutrition Survey goals reviewed with pt. Pt is following a Heart Healthy diet. Pt wants to lose wt. Pt reports never going on a diet. He has loosely tried to lose weight in the past. Wt loss tips reviewed (label reading, how to build a healthy plate, portion sizes, eating frequently across the day). Pt is not interested in reading labels. His wife does read labels.  Pt has Type 2 Diabetes. Last A1c indicates blood glucose well-controlled. Pt is diet controlled.  Pt with dx of CHF. Per discussion, pt does use canned/convenience foods often. Pt does not add salt to food. Pt does not eat out frequently.  Noted elevated triglycerides. Discussed diet and lifestyle changes to help reduce. Pt drinks alcohol occasionally, always less than 2 drinks per day. He drinks 1/2 cup juice morning and evening. No other sugary beverages. Diet recall: Breakfast:  egg, toast, english muffin, coffeex2 Lunch: ham/turkey/tuna sandwich on rye bread with mayo, sometimes chips Dinner: 4-6 oz steak, non starchy veggies, 1/4 plate carbohydrates Snack: fruit (orange and apple) OR sherbet OR cookies   Pt expressed understanding of the information reviewed.   Nutrition Diagnosis ? Obese  II = 35-39.9 related to excessive energy intake as evidenced by a BMI 37.84 kg/m2  Nutrition Intervention ? Pt's individual nutrition plan reviewed with pt. ? Benefits of adopting Heart Healthy diet discussed when Picture Your Plate reviewed. ? Continue client-centered nutrition education by RD,  as part of interdisciplinary care.  Goal(s) ? Pt to identify food quantities necessary to achieve weight loss of 6-24 lb at graduation from cardiac rehab.  ? Pt to build a healthy plate including vegetables, fruits, whole grains, and low-fat dairy products in a heart healthy meal plan. ? Pt to reduce added sugars to 38 g per day and limit refined carbohydrates  Plan:   Will provide client-centered nutrition education as part of interdisciplinary care  Monitor and evaluate progress toward nutrition goal with team.   Michaele Offer, MS, RDN, LDN

## 2020-05-03 ENCOUNTER — Encounter (HOSPITAL_COMMUNITY)
Admission: RE | Admit: 2020-05-03 | Discharge: 2020-05-03 | Disposition: A | Discharge status: Home / Self Care | Payer: Medicare (Managed Care) | Source: Ambulatory Visit | Attending: Cardiovascular Disease | Admitting: Cardiovascular Disease

## 2020-05-03 ENCOUNTER — Other Ambulatory Visit: Payer: Self-pay

## 2020-05-03 ENCOUNTER — Ambulatory Visit: Payer: Medicare (Managed Care) | Admitting: Cardiology

## 2020-05-03 ENCOUNTER — Encounter: Payer: Self-pay | Admitting: Cardiology

## 2020-05-03 VITALS — BP 100/60 | HR 57 | Ht 66.5 in | Wt 236.0 lb

## 2020-05-03 DIAGNOSIS — I4819 Other persistent atrial fibrillation: Secondary | ICD-10-CM | POA: Diagnosis not present

## 2020-05-03 DIAGNOSIS — Z9889 Other specified postprocedural states: Secondary | ICD-10-CM | POA: Diagnosis not present

## 2020-05-03 NOTE — Progress Notes (Signed)
Electrophysiology Office Note   Date:  05/03/2020   ID:  Raymond Christensen, DOB March 28, 1940, MRN [id number]  PCP:  [name], DO  Cardiologist:  Tamala Julian Primary Electrophysiologist:  [name] Meredith [name], MD    Chief Complaint: AF   History of Present Illness: Raymond Christensen is a 80 y.o. male who is being seen today for the evaluation of AF at the request of [name]*. Presenting today for electrophysiology evaluation.  He has a history significant for atrial fibrillation, moderate systolic heart failure, severe mitral regurgitation status post mitral valve clip, coronary artery disease status post CABG.  1994, he had an MI complicated by cardiac arrest and received lytics.  He had CABG x4 in 2001 with postoperative atrial fibrillation.  He is status post left CEA.  He also has diabetes, hypertension, obesity, OSA on CPAP.  Mitral valve clip was 12/24/2019.  He is now status post AF ablation 02/01/2020.  Today, denies symptoms of palpitations, chest pain, shortness of breath, orthopnea, PND, lower extremity edema, claudication, dizziness, presyncope, syncope, bleeding, or neurologic sequela. The patient is tolerating medications without difficulties.  Since his ablation he has done well.  He has no chest pain or shortness of breath.  He is able do all of his daily activities and is without restriction.  He has been going to cardiac rehab.  He is noted no further episodes of atrial fibrillation.   Past Medical History:  Diagnosis Date  . Arthritis   . CAD (coronary artery disease)   . Carotid artery disease ([place])    s/p left CEA 01/17/14  . Chronic systolic CHF (congestive heart failure) ([place])   . Concussion Summer 2012  . Essential hypertension 02/26/2017  . Heart disease   . Hyperlipidemia   . Hypothyroidism 10/15/2017  . Major depressive disorder   . Mitral valve regurgitation   . Myocardial infarction ([place]) 1996, 2021  . Obstructive sleep apnea on CPAP   .  Persistent atrial fibrillation ([place])   . Poor flexibility of tendon 12/01/2018  . 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 Burt Knack  . Subungual hematoma of digit of hand 12/01/2018  . Subungual hematoma of right foot 12/01/2018  . Thyroid disease   . Type 2 diabetes mellitus without complication, without long-term current use of insulin ([place]) 02/26/2017   Past Surgical History:  Procedure Laterality Date  . ATRIAL FIBRILLATION ABLATION N/A 02/01/2020   Procedure: ATRIAL FIBRILLATION ABLATION;  Surgeon: [name], MD;  Location: [facility];  Service: Cardiovascular;  Laterality: N/A;  . BYPASS GRAFT  2001  . CARDIAC CATHETERIZATION    . CARDIOVERSION N/A 11/08/2019   Procedure: CARDIOVERSION;  Surgeon: [name], MD;  Location: [facility] ENDOSCOPY;  Service: Cardiovascular;  Laterality: N/A;  . CAROTID ENDARTERECTOMY Left 01/17/2014  . CORONARY ANGIOPLASTY    . CORONARY ARTERY BYPASS GRAFT    . MITRAL VALVE REPAIR N/A 12/23/2019   Procedure: MITRAL VALVE REPAIR;  Surgeon: [name], MD;  Location: [facility];  Service: Cardiovascular;  Laterality: N/A;  . RIGHT/LEFT HEART CATH AND CORONARY/GRAFT ANGIOGRAPHY N/A 09/09/2019   Procedure: RIGHT/LEFT HEART CATH AND CORONARY/GRAFT ANGIOGRAPHY;  Surgeon: [name], MD;  Location: [place];  Service: Cardiovascular;  Laterality: N/A;  . TEE WITHOUT CARDIOVERSION N/A 09/13/2019   Procedure: TRANSESOPHAGEAL ECHOCARDIOGRAM (TEE);  Surgeon: [name], MD;  Location: [facility];  Service: Cardiovascular;  Laterality: N/A;  .  TEE WITHOUT CARDIOVERSION N/A 12/23/2019   Procedure: TRANSESOPHAGEAL ECHOCARDIOGRAM (TEE);  Surgeon: [name], MD;  Location: [facility];  Service: Cardiovascular;  Laterality: N/A;  . TEE WITHOUT CARDIOVERSION  02/01/2020   Procedure: TRANSESOPHAGEAL ECHOCARDIOGRAM (TEE);  Surgeon: [name], MD;   Location: [facility];  Service: Cardiovascular;;  . TOTAL HIP ARTHROPLASTY  1994, 1995     Current Outpatient Medications  Medication Sig Dispense Refill  . acetaminophen (TYLENOL) 500 MG tablet Take 1,000 mg by mouth every 6 (six) hours as needed for moderate pain or headache.    Marland Kitchen amoxicillin (AMOXIL) 500 MG tablet Take 4 tablets (2,000 mg total) one hour prior to all dental visits. 8 tablet 11  . apixaban (ELIQUIS) 5 MG TABS tablet Take 1 tablet (5 mg total) by mouth 2 (two) times daily. 180 tablet 3  . atorvastatin (LIPITOR) 80 MG tablet Take 1 tablet (80 mg total) by mouth daily. 90 tablet 1  . buPROPion (WELLBUTRIN XL) 150 MG 24 hr tablet Take 1 tablet (150 mg total) by mouth daily. 30 tablet 3  . carvedilol (COREG) 6.25 MG tablet Take 1 tablet (6.25 mg total) by mouth 2 (two) times daily with a meal. 180 tablet 3  . Cholecalciferol (VITAMIN D3) 250 MCG (10000 UT) capsule Take 10,000 Units by mouth daily.     . clopidogrel (PLAVIX) 75 MG tablet TAKE 1 TABLET DAILY WITH   BREAKFAST 30 tablet 3  . ezetimibe (ZETIA) 10 MG tablet Take 1 tablet (10 mg total) by mouth daily. 90 tablet 1  . famotidine (PEPCID) 20 MG tablet Take 1 tablet (20 mg total) by mouth daily. 30 tablet 3  . finasteride (PROSCAR) 5 MG tablet TAKE 1 TABLET DAILY 90 tablet 1  . furosemide (LASIX) 40 MG tablet Take 1 tablet (40 mg total) by mouth daily. 90 tablet 3  . levocetirizine (XYZAL) 5 MG tablet TAKE 1 TABLET EVERY EVENING 90 tablet 1  . levothyroxine (SYNTHROID) 75 MCG tablet Take 1 tablet (75 mcg total) by mouth daily. 90 tablet 1  . liothyronine (CYTOMEL) 5 MCG tablet TAKE 2 TABLETS DAILY 180 tablet 3  . Multiple Vitamins-Minerals (MULTIVITAMIN ADULT EXTRA C PO) Take 1 tablet by mouth daily.     . nitroGLYCERIN (NITROSTAT) 0.4 MG SL tablet Place 1 tablet (0.4 mg total) under the tongue every 5 (five) minutes as needed for chest pain. 30 tablet 12  . omega-3 acid ethyl esters (LOVAZA) 1 g capsule TAKE 2  CAPSULES TWICE DAILY 360 capsule 2  . potassium chloride (KLOR-CON) 10 MEQ tablet TAKE 2 TABLETS DAILY AS    NEEDED WITH EVERY 40MG  DOSEOF LASIX 90 tablet 1  . sacubitril-valsartan (ENTRESTO) 24-26 MG Take 1 tablet by mouth 2 (two) times daily. 180 tablet 3  . spironolactone (ALDACTONE) 25 MG tablet Take 0.5 tablets (12.5 mg total) by mouth daily. 135 tablet 3  . tamsulosin (FLOMAX) 0.4 MG CAPS capsule TAKE 1 CAPSULE DAILY (Patient taking differently: Take 0.4 mg by mouth in the morning and at bedtime.) 90 capsule 3  . vitamin B-12 (CYANOCOBALAMIN) 1000 MCG tablet Take 1,000 mcg by mouth daily.    Marland Kitchen zinc gluconate 50 MG tablet Take 50 mg by mouth daily.     No current facility-administered medications for this visit.    Allergies:   Sulfa antibiotics   Social History:  The patient  reports that he quit smoking about 42 years ago. His smoking use included cigarettes. He has never  used smokeless tobacco. He reports current alcohol use of about 4.0 standard drinks of alcohol per week. He reports that he does not use drugs.   Family History:  The patient's family history includes Dementia in his father.   ROS:  Please see the history of present illness.   Otherwise, review of systems is positive for none.   All other systems are reviewed and negative.   PHYSICAL EXAM: VS:  BP 100/60 (BP Location: Left Arm, Patient Position: Sitting, Cuff Size: Normal)   Pulse (!) 57   Ht 5' 6.5" (1.689 m)   Wt 236 lb (107 kg)   BMI 37.52 kg/m  , BMI Body mass index is 37.52 kg/m. GEN: Well nourished, well developed, in no acute distress  HEENT: normal  Neck: no JVD, carotid bruits, or masses Cardiac: RRR; no murmurs, rubs, or gallops,no edema  Respiratory:  clear to auscultation bilaterally, normal work of breathing GI: soft, nontender, nondistended, + BS MS: no deformity or atrophy  Skin: warm and dry Neuro:  Strength and sensation are intact Psych: euthymic mood, full affect  EKG:  EKG is ordered  today. Personal review of the ekg ordered shows sinus rhythm, rate 57  Recent Labs: 09/11/2019: Magnesium 1.8 09/30/2019: TSH 2.990 12/21/2019: ALT 43; B Natriuretic Peptide 151.2 01/13/2020: Hemoglobin 13.8; Platelets 148 01/26/2020: BUN 28; Creatinine, Ser 1.32; Potassium 4.7; Sodium 141    Lipid Panel     Component Value Date/Time   CHOL 110 04/24/2020 1006   TRIG 190 (H) 04/24/2020 1006   HDL 42 04/24/2020 1006   CHOLHDL 2.6 04/24/2020 1006   CHOLHDL 3.2 09/08/2019 1254   VLDL 17 09/08/2019 1254   LDLCALC 37 04/24/2020 1006   LDLDIRECT 76.0 06/22/2019 1503     Wt Readings from Last 3 Encounters:  05/03/20 236 lb (107 kg)  05/01/20 238 lb (108 kg)  04/18/20 238 lb 1.6 oz (108 kg)      Other studies Reviewed: Additional studies/ records that were reviewed today include: TEE 12/25/2019 Review of the above records today demonstrates:  1. Day 1 post Mitraclip placement, LVEF 40-45%, mitral regurgitation  mild, mean transmitral gradient 2 mmHg.  2. Left ventricular ejection fraction, by estimation, is 40 to 45%. The  left ventricle has mildly decreased function. The left ventricle  demonstrates global hypokinesis. Left ventricular diastolic function could  not be evaluated.  3. Right ventricular systolic function is mildly reduced. The right  ventricular size is normal.  4. Left atrial size was severely dilated.  5. Right atrial size was moderately dilated.  6. The mitral valve is normal in structure. Mild mitral valve  regurgitation. No evidence of mitral stenosis. The mean mitral valve  gradient is 2.0 mmHg with average heart rate of 61 bpm. There is a XTW  MitralClip present in the mitral position.  Procedure Date: 12/23/2019.  7. The aortic valve is normal in structure. Aortic valve regurgitation is  not visualized. No aortic stenosis is present.  8. The inferior vena cava is normal in size with greater than 50%  respiratory variability, suggesting right  atrial pressure of 3 mmHg.    RHC/LHC 09/10/19  Mid LM to Dist LM lesion is 70% stenosed.  1st Mrg lesion is 75% stenosed.  2nd Mrg-1 lesion is 90% stenosed.  2nd Mrg-2 lesion is 95% stenosed.  Mid RCA to Dist RCA lesion is 100% stenosed.  Prox RCA to Mid RCA lesion is 90% stenosed.  2nd Diag lesion is 75% stenosed.  Origin  to Prox Graft lesion between 2nd Mrg and RPDA is 99% stenosed.  Prox Graft to Mid Graft lesion between Ramus and 2nd Mrg is 75% stenosed.  A drug-eluting stent was successfully placed using a STENT RESOLUTE ONYX 3.0X18.  Post intervention, there is a 0% residual stenosis.  A drug-eluting stent was successfully placed.  Post intervention, there is a 0% residual stenosis.  Hemodynamic findings consistent with mitral valve regurgitation.   ASSESSMENT AND PLAN:  1.  Persistent atrial fibrillation: Currently on amiodarone, Eliquis, Coreg, high risk medication monitoring.  Status post mitral valve clip for mitral regurgitation.  Is now status post atrial fibrillation ablation 02/01/2020.  CHA2DS2-VASc of 6.  He remains in sinus rhythm.  Due to that, we Eknoor Novack stop amiodarone.  2.  Coronary artery disease status post CABG: No current chest pain.  Emojean Gertz discuss with his primary cardiologist whether or not Plavix can be stopped.  3.  Hypertension: Currently well controlled  4.  Chronic systolic heart failure secondary to ischemic cardiomyopathy: Currently on Coreg, Aldactone, Entresto.  No signs of volume overload.  Plan per primary cardiology.   5.  Obstructive sleep apnea: CPAP compliance encouraged  6.  Severe mitral regurgitation: Status post mitral valve clip 12/23/2019.  Plan per primary cardiology.     Current medicines are reviewed at length with the patient today.   The patient does not have concerns regarding his medicines.  The following changes were made today: Stop amiodarone  Labs/ tests ordered today include:  Orders Placed This Encounter   Procedures  . EKG 12-Lead     Disposition:   FU with Qasim Diveley 3 months  Signed, Veralyn Lopp Meredith [name], MD  05/03/2020 10:54 AM     [facility] [address] [place] [address] [phone] (office) [phone] (fax)

## 2020-05-03 NOTE — Patient Instructions (Signed)
Medication Instructions:  Your physician has recommended you make the following change in your medication:  1. STOP Amiodarone  *If you need a refill on your cardiac medications before your next appointment, please call your pharmacy*   Lab Work: None ordered   Testing/Procedures: None ordered   Follow-Up: At [facility], you and your health needs are our priority.  As part of our continuing mission to provide you with exceptional heart care, we have created designated Provider Care Teams.  These Care Teams include your primary Cardiologist (physician) and Advanced Practice Providers (APPs -  Physician Assistants and Nurse Practitioners) who all work together to provide you with the care you need, when you need it.   Your next appointment:   3 month(s)  The format for your next appointment:   In Person  Provider:   Will Camnitz, MD    Thank you for choosing [facility]!!   Clarity Ciszek, RN [phone]    

## 2020-05-05 ENCOUNTER — Encounter (HOSPITAL_COMMUNITY)
Admission: RE | Admit: 2020-05-05 | Discharge: 2020-05-05 | Disposition: A | Discharge status: Home / Self Care | Payer: Medicare (Managed Care) | Source: Ambulatory Visit | Attending: Cardiovascular Disease | Admitting: Cardiovascular Disease

## 2020-05-05 ENCOUNTER — Other Ambulatory Visit: Payer: Self-pay

## 2020-05-05 DIAGNOSIS — Z9889 Other specified postprocedural states: Secondary | ICD-10-CM

## 2020-05-07 ENCOUNTER — Other Ambulatory Visit: Payer: Self-pay | Admitting: Cardiovascular Disease

## 2020-05-07 ENCOUNTER — Other Ambulatory Visit: Payer: Self-pay | Admitting: Cardiology

## 2020-05-07 DIAGNOSIS — R600 Localized edema: Secondary | ICD-10-CM

## 2020-05-08 ENCOUNTER — Other Ambulatory Visit: Payer: Self-pay

## 2020-05-08 ENCOUNTER — Encounter (HOSPITAL_COMMUNITY)
Admission: RE | Admit: 2020-05-08 | Discharge: 2020-05-08 | Disposition: A | Discharge status: Home / Self Care | Payer: Medicare (Managed Care) | Source: Ambulatory Visit | Attending: Cardiovascular Disease | Admitting: Cardiovascular Disease

## 2020-05-08 DIAGNOSIS — Z9889 Other specified postprocedural states: Secondary | ICD-10-CM | POA: Diagnosis not present

## 2020-05-09 NOTE — Progress Notes (Signed)
Cardiac Individual Treatment Plan  Patient Details  Name: Raymond Christensen MRN: [id number] Date of Birth: 1940/03/29 Referring Provider:   Flowsheet Row CARDIAC REHAB PHASE II ORIENTATION from 04/18/2020 in [facility]  Referring Provider [name], MD      Initial Encounter Date:  [place] PHASE II ORIENTATION from 04/18/2020 in [facility]  Date 04/18/20      Visit Diagnosis: 12/23/19 S/P Mitraclip, S/P Ablation 02/01/20  Patient's Home Medications on Admission:  Current Outpatient Medications:  .  acetaminophen (TYLENOL) 500 MG tablet, Take 1,000 mg by mouth every 6 (six) hours as needed for moderate pain or headache., Disp: , Rfl:  .  amoxicillin (AMOXIL) 500 MG tablet, Take 4 tablets (2,000 mg total) one hour prior to all dental visits., Disp: 8 tablet, Rfl: 11 .  apixaban (ELIQUIS) 5 MG TABS tablet, Take 1 tablet (5 mg total) by mouth 2 (two) times daily., Disp: 180 tablet, Rfl: 3 .  atorvastatin (LIPITOR) 80 MG tablet, Take 1 tablet (80 mg total) by mouth daily., Disp: 90 tablet, Rfl: 1 .  buPROPion (WELLBUTRIN XL) 150 MG 24 hr tablet, Take 1 tablet (150 mg total) by mouth daily., Disp: 30 tablet, Rfl: 3 .  carvedilol (COREG) 6.25 MG tablet, Take 1 tablet (6.25 mg total) by mouth 2 (two) times daily with a meal., Disp: 180 tablet, Rfl: 3 .  Cholecalciferol (VITAMIN D3) 250 MCG (10000 UT) capsule, Take 10,000 Units by mouth daily. , Disp: , Rfl:  .  clopidogrel (PLAVIX) 75 MG tablet, TAKE 1 TABLET DAILY WITH   BREAKFAST, Disp: 30 tablet, Rfl: 3 .  ezetimibe (ZETIA) 10 MG tablet, TAKE 1 TABLET DAILY, Disp: 90 tablet, Rfl: 3 .  famotidine (PEPCID) 20 MG tablet, Take 1 tablet (20 mg total) by mouth daily., Disp: 30 tablet, Rfl: 3 .  finasteride (PROSCAR) 5 MG tablet, TAKE 1 TABLET DAILY, Disp: 90 tablet, Rfl: 1 .  furosemide (LASIX) 40 MG tablet, Take 1 tablet (40 mg total) by mouth daily., Disp: 90  tablet, Rfl: 3 .  levocetirizine (XYZAL) 5 MG tablet, TAKE 1 TABLET EVERY EVENING, Disp: 90 tablet, Rfl: 1 .  levothyroxine (SYNTHROID) 75 MCG tablet, Take 1 tablet (75 mcg total) by mouth daily., Disp: 90 tablet, Rfl: 1 .  liothyronine (CYTOMEL) 5 MCG tablet, TAKE 2 TABLETS DAILY, Disp: 180 tablet, Rfl: 3 .  Multiple Vitamins-Minerals (MULTIVITAMIN ADULT EXTRA C PO), Take 1 tablet by mouth daily. , Disp: , Rfl:  .  nitroGLYCERIN (NITROSTAT) 0.4 MG SL tablet, Place 1 tablet (0.4 mg total) under the tongue every 5 (five) minutes as needed for chest pain., Disp: 30 tablet, Rfl: 12 .  omega-3 acid ethyl esters (LOVAZA) 1 g capsule, TAKE 2 CAPSULES TWICE DAILY, Disp: 360 capsule, Rfl: 2 .  potassium chloride (KLOR-CON) 10 MEQ tablet, TAKE 2 TABLETS DAILY AS    NEEDED WITH EVERY 40MG  DOSEOF LASIX, Disp: 90 tablet, Rfl: 0 .  sacubitril-valsartan (ENTRESTO) 24-26 MG, Take 1 tablet by mouth 2 (two) times daily., Disp: 180 tablet, Rfl: 3 .  spironolactone (ALDACTONE) 25 MG tablet, Take 0.5 tablets (12.5 mg total) by mouth daily., Disp: 135 tablet, Rfl: 3 .  tamsulosin (FLOMAX) 0.4 MG CAPS capsule, TAKE 1 CAPSULE DAILY (Patient taking differently: Take 0.4 mg by mouth in the morning and at bedtime.), Disp: 90 capsule, Rfl: 3 .  vitamin B-12 (CYANOCOBALAMIN) 1000 MCG tablet, Take 1,000 mcg by mouth daily., Disp: ,  Rfl:  .  zinc gluconate 50 MG tablet, Take 50 mg by mouth daily., Disp: , Rfl:   Past Medical History: Past Medical History:  Diagnosis Date  . Arthritis   . CAD (coronary artery disease)   . Carotid artery disease ([place])    s/p left CEA 01/17/14  . Chronic systolic CHF (congestive heart failure) ([place])   . Concussion Summer 2012  . Essential hypertension 02/26/2017  . Heart disease   . Hyperlipidemia   . Hypothyroidism 10/15/2017  . Major depressive disorder   . Mitral valve regurgitation   . Myocardial infarction ([place]) 1996, 2021  . Obstructive sleep apnea on CPAP   . Persistent atrial  fibrillation ([place])   . Poor flexibility of tendon 12/01/2018  . 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 Burt Knack  . Subungual hematoma of digit of hand 12/01/2018  . Subungual hematoma of right foot 12/01/2018  . Thyroid disease   . Type 2 diabetes mellitus without complication, without long-term current use of insulin ([place]) 02/26/2017    Tobacco Use: Social History   Tobacco Use  Smoking Status Former Smoker  . Types: Cigarettes  . Quit date: 28  . Years since quitting: 42.3  Smokeless Tobacco Never Used    Labs: Recent Chemical engineer    Labs for ITP Cardiac and Pulmonary Rehab Latest Ref Rng & Units 09/09/2019 09/09/2019 09/09/2019 12/21/2019 04/24/2020   Cholestrol 100 - 199 mg/dL - - - - 110   LDLCALC 0 - 99 mg/dL - - - - 37   LDLDIRECT mg/dL - - - - -   HDL >39 mg/dL - - - - 42   Trlycerides 0 - 149 mg/dL - - - - 190(H)   Hemoglobin A1c 4.8 - 5.6 % - - - 6.7(H) -   PHART 7.350 - 7.450 - - 7.386 7.437 -   PCO2ART 32.0 - 48.0 mmHg - - 49.2(H) 36.9 -   HCO3 20.0 - 28.0 mmol/L 30.1(H) 29.9(H) 29.5(H) 24.4 -   TCO2 22 - 32 mmol/L 32 31 31 - -   O2SAT % 70.0 71.0 100.0 97.2 -      Capillary Blood Glucose: Lab Results  Component Value Date   GLUCAP 122 (H) 02/01/2020   GLUCAP 121 (H) 02/01/2020   GLUCAP 184 (H) 12/23/2019   GLUCAP 182 (H) 12/23/2019   GLUCAP 123 (H) 12/23/2019     Exercise Target Goals: Exercise Program Goal: Individual exercise prescription set using results from initial 6 min walk test and THRR while considering  patient's activity barriers and safety.   Exercise Prescription Goal: Starting with aerobic activity 30 plus minutes a day, 3 days per week for initial exercise prescription. Provide home exercise prescription and guidelines that participant acknowledges understanding prior to discharge.  Activity Barriers & Risk Stratification:  Activity Barriers & Cardiac Risk Stratification -  04/18/20 1426      Activity Barriers & Cardiac Risk Stratification   Activity Barriers Balance Concerns    Cardiac Risk Stratification High           6 Minute Walk:  6 Minute Walk    Row Name 04/18/20 1425         6 Minute Walk   Phase Initial     Distance 1443 feet     Walk Time 6 minutes     # of Rest Breaks 0     MPH 2.73     METS  2.21     RPE 10     Perceived Dyspnea  0     VO2 Peak 7.73     Symptoms No     Resting HR 57 bpm     Resting BP 98/65     Resting Oxygen Saturation  97 %     Exercise Oxygen Saturation  during 6 min walk 98 %     Max Ex. HR 96 bpm     Max Ex. BP 150/70     2 Minute Post BP 127/67            Oxygen Initial Assessment:   Oxygen Re-Evaluation:   Oxygen Discharge (Final Oxygen Re-Evaluation):   Initial Exercise Prescription:  Initial Exercise Prescription - 04/18/20 1400      Date of Initial Exercise RX and Referring Provider   Date 04/18/20    Referring Provider [name], MD    Expected Discharge Date 06/16/20      NuStep   Level 2    SPM 75    Minutes 15    METs 2      Arm Ergometer   Level 1.8    Minutes 15    METs 2      Prescription Details   Frequency (times per week) 3    Duration Progress to 30 minutes of continuous aerobic without signs/symptoms of physical distress      Intensity   THRR 40-80% of Max Heartrate 57-114    Ratings of Perceived Exertion 11-13    Perceived Dyspnea 0-4      Progression   Progression Continue progressive overload as per policy without signs/symptoms or physical distress.      Resistance Training   Training Prescription Yes    Weight 3 lbs    Reps 10-15           Perform Capillary Blood Glucose checks as needed.  Exercise Prescription Changes:  Exercise Prescription Changes    Row Name 04/24/20 1000 05/03/20 1000           Response to Exercise   Blood Pressure (Admit) 107/52 138/70      Blood Pressure (Exercise) 124/72 134/68      Blood Pressure  (Exit) 102/58 120/60      Heart Rate (Admit) 72 bpm 64 bpm      Heart Rate (Exercise) 88 bpm 85 bpm      Heart Rate (Exit) 70 bpm 67 bpm      Rating of Perceived Exertion (Exercise) 11 12      Symptoms None None      Comments Pt's first day of exercise in the CRP2 program Reviewed METs      Duration Progress to 30 minutes of  aerobic without signs/symptoms of physical distress Continue with 30 min of aerobic exercise without signs/symptoms of physical distress.      Intensity THRR unchanged THRR unchanged             Progression   Progression Continue to progress workloads to maintain intensity without signs/symptoms of physical distress. Continue to progress workloads to maintain intensity without signs/symptoms of physical distress.      Average METs 2.1 2.2             Resistance Training   Training Prescription Yes No      Weight 3 lbs No weights on Wednesdays      Reps 10-15 --      Time 10 Minutes --  Interval Training   Interval Training No No             NuStep   Level 2 3      SPM 75 85      Minutes 15 15      METs 2.3 2.2             Arm Ergometer   Level 1.5 15.5      Minutes 15 15      METs 1.8 2             Exercise Comments:  Exercise Comments    Row Name 04/24/20 1011 05/03/20 1014         Exercise Comments Pt's first day of exericse in the CRP2 program. Pt had no complaints with todays session and is off to a good start. Reviewed METs today. Pt increased to level 3 on nustep today.             Exercise Goals and Review:  Exercise Goals    Row Name 04/18/20 1431             Exercise Goals   Increase Physical Activity Yes       Intervention Provide advice, education, support and counseling about physical activity/exercise needs.;Develop an individualized exercise prescription for aerobic and resistive training based on initial evaluation findings, risk stratification, comorbidities and participant's personal goals.        Expected Outcomes Short Term: Attend rehab on a regular basis to increase amount of physical activity.;Long Term: Add in home exercise to make exercise part of routine and to increase amount of physical activity.;Long Term: Exercising regularly at least 3-5 days a week.       Increase Strength and Stamina Yes       Intervention Provide advice, education, support and counseling about physical activity/exercise needs.;Develop an individualized exercise prescription for aerobic and resistive training based on initial evaluation findings, risk stratification, comorbidities and participant's personal goals.       Expected Outcomes Short Term: Increase workloads from initial exercise prescription for resistance, speed, and METs.;Short Term: Perform resistance training exercises routinely during rehab and add in resistance training at home;Long Term: Improve cardiorespiratory fitness, muscular endurance and strength as measured by increased METs and functional capacity (6MWT)       Able to understand and use rate of perceived exertion (RPE) scale Yes       Intervention Provide education and explanation on how to use RPE scale       Expected Outcomes Short Term: Able to use RPE daily in rehab to express subjective intensity level;Long Term:  Able to use RPE to guide intensity level when exercising independently       Knowledge and understanding of Target Heart Rate Range (THRR) Yes       Intervention Provide education and explanation of THRR including how the numbers were predicted and where they are located for reference       Expected Outcomes Short Term: Able to state/look up THRR;Short Term: Able to use daily as guideline for intensity in rehab;Long Term: Able to use THRR to govern intensity when exercising independently       Understanding of Exercise Prescription Yes       Intervention Provide education, explanation, and written materials on patient's individual exercise prescription       Expected  Outcomes Short Term: Able to explain program exercise prescription;Long Term: Able to explain home exercise prescription to exercise independently  Exercise Goals Re-Evaluation :  Exercise Goals Re-Evaluation    [place] Name 04/24/20 1009             Exercise Goal Re-Evaluation   Exercise Goals Review Increase Physical Activity;Increase Strength and Stamina;Able to understand and use rate of perceived exertion (RPE) scale;Knowledge and understanding of Target Heart Rate Range (THRR);Understanding of Exercise Prescription       Comments Pt's first day of exercise in the CRP2 program. Pt understands the exercise Rx, RPE scale, and THRR.       Expected Outcomes Will continue to monitor the patient and progress exercise workloads as tolerated.               Discharge Exercise Prescription (Final Exercise Prescription Changes):  Exercise Prescription Changes - 05/03/20 1000      Response to Exercise   Blood Pressure (Admit) 138/70    Blood Pressure (Exercise) 134/68    Blood Pressure (Exit) 120/60    Heart Rate (Admit) 64 bpm    Heart Rate (Exercise) 85 bpm    Heart Rate (Exit) 67 bpm    Rating of Perceived Exertion (Exercise) 12    Symptoms None    Comments Reviewed METs    Duration Continue with 30 min of aerobic exercise without signs/symptoms of physical distress.    Intensity THRR unchanged      Progression   Progression Continue to progress workloads to maintain intensity without signs/symptoms of physical distress.    Average METs 2.2      Resistance Training   Training Prescription No    Weight No weights on Wednesdays      Interval Training   Interval Training No      NuStep   Level 3    SPM 85    Minutes 15    METs 2.2      Arm Ergometer   Level 15.5    Minutes 15    METs 2           Nutrition:  Target Goals: Understanding of nutrition guidelines, daily intake of sodium 1500mg , cholesterol 200mg , calories 30% from fat and 7% or less  from saturated fats, daily to have 5 or more servings of fruits and vegetables.  Biometrics:  Pre Biometrics - 04/18/20 1300      Pre Biometrics   Waist Circumference 47.5 inches    Hip Circumference 49 inches    Waist to Hip Ratio 0.97 %    Triceps Skinfold 15 mm    % Body Fat 35.5 %    Grip Strength 42 kg    Flexibility 0 in   Pt cannot reach   Single Leg Stand 2.68 seconds            Nutrition Therapy Plan and Nutrition Goals:  Nutrition Therapy & Goals - 05/01/20 1027      Nutrition Therapy   Diet Heart healthy/carb modified    Drug/Food Interactions Statins/Certain Fruits      Personal Nutrition Goals   Nutrition Goal Pt to identify food quantities necessary to achieve weight loss of 6-24 lb at graduation from cardiac rehab.    Personal Goal #2 Pt to build a healthy plate including vegetables, fruits, whole grains, and low-fat dairy products in a heart healthy meal plan.    Personal Goal #3 Pt to reduce added sugars to 38 g per day and limit refined carbohydrates      Intervention Plan   Intervention Prescribe, educate and counsel regarding individualized specific dietary modifications  aiming towards targeted core components such as weight, hypertension, lipid management, diabetes, heart failure and other comorbidities.;Nutrition handout(s) given to patient.    Expected Outcomes Short Term Goal: Understand basic principles of dietary content, such as calories, fat, sodium, cholesterol and nutrients.;Long Term Goal: Adherence to prescribed nutrition plan.           Nutrition Assessments:  MEDIFICTS Score Key:  ?70 Need to make dietary changes   40-70 Heart Healthy Diet  ? 40 Therapeutic Level Cholesterol Diet  Flowsheet Row CARDIAC REHAB PHASE II EXERCISE from 05/05/2020 in [place]  Picture Your Plate Total Score on Admission 63     Picture Your Plate Scores:  [id number] Unhealthy dietary pattern with much room for  improvement.  41-50 Dietary pattern unlikely to meet recommendations for good health and room for improvement.  51-60 More healthful dietary pattern, with some room for improvement.   >60 Healthy dietary pattern, although there may be some specific behaviors that could be improved.    Nutrition Goals Re-Evaluation:  Nutrition Goals Re-Evaluation    [place] Name 05/01/20 1028 05/05/20 1201           Goals   Current Weight 238 lb 4.8 oz (108.1 kg) 238 lb 5.1 oz (108.1 kg)      Nutrition Goal Pt to identify food quantities necessary to achieve weight loss of 6-24 lb at graduation from cardiac rehab. Pt to identify food quantities necessary to achieve weight loss of 6-24 lb at graduation from cardiac rehab.             Personal Goal #2 Re-Evaluation   Personal Goal #2 Pt to build a healthy plate including vegetables, fruits, whole grains, and low-fat dairy products in a heart healthy meal plan. Pt to build a healthy plate including vegetables, fruits, whole grains, and low-fat dairy products in a heart healthy meal plan.             Personal Goal #3 Re-Evaluation   Personal Goal #3 Pt to reduce added sugars to 38 g per day and limit refined carbohydrates Pt to reduce added sugars to 38 g per day and limit refined carbohydrates             Nutrition Goals Discharge (Final Nutrition Goals Re-Evaluation):  Nutrition Goals Re-Evaluation - 05/05/20 1201      Goals   Current Weight 238 lb 5.1 oz (108.1 kg)    Nutrition Goal Pt to identify food quantities necessary to achieve weight loss of 6-24 lb at graduation from cardiac rehab.      Personal Goal #2 Re-Evaluation   Personal Goal #2 Pt to build a healthy plate including vegetables, fruits, whole grains, and low-fat dairy products in a heart healthy meal plan.      Personal Goal #3 Re-Evaluation   Personal Goal #3 Pt to reduce added sugars to 38 g per day and limit refined carbohydrates           Psychosocial: Target Goals:  Acknowledge presence or absence of significant depression and/or stress, maximize coping skills, provide positive support system. Participant is able to verbalize types and ability to use techniques and skills needed for reducing stress and depression.  Initial Review & Psychosocial Screening:  Initial Psych Review & Screening - 04/18/20 1510      Initial Review   Current issues with None Identified;History of Depression      Family Dynamics   Good Support System? Yes   Bobbie has  his wife for support   Comments Toliver denies being depressed. Ahzir takes Wellbutrin      Barriers   Psychosocial barriers to participate in program There are no identifiable barriers or psychosocial needs.      Screening Interventions   Interventions Encouraged to exercise    Expected Outcomes Long Term Goal: Stressors or current issues are controlled or eliminated.           Quality of Life Scores:  Quality of Life - 04/18/20 1424      Quality of Life   Select Quality of Life      Quality of Life Scores   Health/Function Pre 21.5 %    Socioeconomic Pre 27.92 %    Psych/Spiritual Pre 21.14 %    Family Pre 28.5 %    GLOBAL Pre 23.72 %          Scores of 19 and below usually indicate a poorer quality of life in these areas.  A difference of  2-3 points is a clinically meaningful difference.  A difference of 2-3 points in the total score of the Quality of Life Index has been associated with significant improvement in overall quality of life, self-image, physical symptoms, and general health in studies assessing change in quality of life.  PHQ-9: Recent Review Flowsheet Data    Depression screen [facility] 2/9 04/18/2020 04/18/2020 12/17/2019 10/21/2019 06/02/2019   Decreased Interest 0 0 0 0 1   Down, Depressed, Hopeless 0 0 0 1 1   PHQ - 2 Score 0 0 0 1 2   Altered sleeping - - - - 0   Tired, decreased energy - - - - 1   Change in appetite - - - - 0   Feeling bad or failure about yourself  - - - - 0    Trouble concentrating - - - - 0   Moving slowly or fidgety/restless - - - - 0   Suicidal thoughts - - - - 0   PHQ-9 Score - - - - 3   Difficult doing work/chores - - - - Somewhat difficult     Interpretation of Total Score  Total Score Depression Severity:  1-4 = Minimal depression, 5-9 = Mild depression, 10-14 = Moderate depression, 15-19 = Moderately severe depression, 20-27 = Severe depression   Psychosocial Evaluation and Intervention:   Psychosocial Re-Evaluation:  Psychosocial Re-Evaluation    Row Name 05/09/20 1219             Psychosocial Re-Evaluation   Current issues with History of Depression       Comments Merdith has some health stressors and has not voiced any increased stressors or concern       Expected Outcomes Will continue to monitor and offer suppport as needed       Interventions Stress management education;Encouraged to attend Cardiac Rehabilitation for the exercise       Continue Psychosocial Services  No Follow up required              Psychosocial Discharge (Final Psychosocial Re-Evaluation):  Psychosocial Re-Evaluation - 05/09/20 1219      Psychosocial Re-Evaluation   Current issues with History of Depression    Comments [place] has some health stressors and has not voiced any increased stressors or concern    Expected Outcomes Will continue to monitor and offer suppport as needed    Interventions Stress management education;Encouraged to attend Cardiac Rehabilitation for the exercise    Continue Psychosocial Services  No  Follow up required           Vocational Rehabilitation: Provide vocational rehab assistance to qualifying candidates.   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 Participant will state understanding/return demonstration of topics presented.  Learning Barriers/Preferences:  Learning Barriers/Preferences - 04/18/20 1416      Learning Barriers/Preferences   Learning Barriers Hearing;Sight   Wears hearing aids and glasses   Learning Preferences Written Material;Video;Pictoral;Computer/Internet           Education Topics: Hypertension, Hypertension Reduction -Define heart disease and high blood pressure. Discus how high blood pressure affects the body and ways to reduce high blood pressure.   Exercise and Your Heart -Discuss why it is important to exercise, the FITT principles of exercise, normal and abnormal responses to exercise, and how to exercise safely.   Angina -Discuss definition of angina, causes of angina, treatment of angina, and how to decrease risk of having angina.   Cardiac Medications -Review what the following cardiac medications are used for, how they affect the body, and side effects that may occur when taking the medications.  Medications include Aspirin, Beta blockers, calcium channel blockers, ACE Inhibitors, angiotensin receptor blockers, diuretics, digoxin, and antihyperlipidemics.   Congestive Heart Failure -Discuss the definition of CHF, how to live with CHF, the signs and symptoms of CHF, and how keep track of weight and sodium intake.   Heart Disease and Intimacy -Discus the effect sexual activity has on the heart, how changes occur during intimacy as we age, and safety during sexual activity.   Smoking Cessation / COPD -Discuss different methods to quit smoking, the health benefits of quitting smoking, and the definition of COPD.   Nutrition I: Fats -Discuss the types of cholesterol, what cholesterol does to the heart, and how cholesterol levels can be controlled.   Nutrition II:  Labels -Discuss the different components of food labels and how to read food label   Heart Parts/Heart Disease and PAD -Discuss the anatomy of the heart, the pathway of blood circulation through the heart, and these are affected by heart disease.   Stress I: Signs and Symptoms -Discuss the causes of stress, how stress may lead to anxiety and depression, and ways to limit stress.   Stress II: Relaxation -Discuss different types of relaxation techniques to limit stress.   Warning Signs of Stroke / TIA -Discuss definition of a stroke, what the signs and symptoms are of a stroke, and how to identify when someone is having stroke.   Knowledge Questionnaire Score:  Knowledge Questionnaire Score - 04/18/20 1416      Knowledge Questionnaire Score   Pre Score 21/24           Core Components/Risk Factors/Patient Goals at Admission:  Personal Goals and Risk Factors at Admission - 04/18/20 1420      Core Components/Risk Factors/Patient Goals on Admission    Weight Management Yes;Obesity;Weight Loss    Intervention Weight Management: Develop a combined nutrition and exercise program designed to reach desired caloric intake, while maintaining appropriate intake of nutrient and fiber, sodium and fats, and appropriate energy expenditure required for the weight  goal.;Weight Management: Provide education and appropriate resources to help participant work on and attain dietary goals.;Weight Management/Obesity: Establish reasonable short term and long term weight goals.;Obesity: Provide education and appropriate resources to help participant work on and attain dietary goals.    Admit Weight 238 lb 1.6 oz (108 kg)    Expected Outcomes Short Term: Continue to assess and modify interventions until short term weight is achieved;Long Term: Adherence to nutrition and physical activity/exercise program aimed toward attainment of established weight goal;Weight Maintenance: Understanding of the daily  nutrition guidelines, which includes 25-35% calories from fat, 7% or less cal from saturated fats, less than 200mg  cholesterol, less than 1.5gm of sodium, & 5 or more servings of fruits and vegetables daily;Weight Loss: Understanding of general recommendations for a balanced deficit meal plan, which promotes 1-2 lb weight loss per week and includes a negative energy balance of [phone] kcal/d;Understanding recommendations for meals to include 15-35% energy as protein, 25-35% energy from fat, 35-60% energy from carbohydrates, less than 200mg  of dietary cholesterol, 20-35 gm of total fiber daily;Understanding of distribution of calorie intake throughout the day with the consumption of 4-5 meals/snacks    Diabetes Yes    Intervention Provide education about signs/symptoms and action to take for hypo/hyperglycemia.;Provide education about proper nutrition, including hydration, and aerobic/resistive exercise prescription along with prescribed medications to achieve blood glucose in normal ranges: Fasting glucose 65-99 mg/dL    Expected Outcomes Short Term: Participant verbalizes understanding of the signs/symptoms and immediate care of hyper/hypoglycemia, proper foot care and importance of medication, aerobic/resistive exercise and nutrition plan for blood glucose control.;Long Term: Attainment of HbA1C < 7%.    Heart Failure Yes    Intervention Provide a combined exercise and nutrition program that is supplemented with education, support and counseling about heart failure. Directed toward relieving symptoms such as shortness of breath, decreased exercise tolerance, and extremity edema.    Hypertension Yes    Intervention Monitor prescription use compliance.;Provide education on lifestyle modifcations including regular physical activity/exercise, weight management, moderate sodium restriction and increased consumption of fresh fruit, vegetables, and low fat dairy, alcohol moderation, and smoking cessation.     Expected Outcomes Short Term: Continued assessment and intervention until BP is < 140/43mm HG in hypertensive participants. < 130/96mm HG in hypertensive participants with diabetes, heart failure or chronic kidney disease.;Long Term: Maintenance of blood pressure at goal levels.    Lipids Yes    Intervention Provide education and support for participant on nutrition & aerobic/resistive exercise along with prescribed medications to achieve LDL 70mg , HDL >40mg .    Expected Outcomes Short Term: Participant states understanding of desired cholesterol values and is compliant with medications prescribed. Participant is following exercise prescription and nutrition guidelines.;Long Term: Cholesterol controlled with medications as prescribed, with individualized exercise RX and with personalized nutrition plan. Value goals: LDL < 70mg , HDL > 40 mg.           Core Components/Risk Factors/Patient Goals Review:   Goals and Risk Factor Review    Row Name 05/09/20 1221             Core Components/Risk Factors/Patient Goals Review   Personal Goals Review Weight Management/Obesity;Stress;Hypertension;Diabetes;Heart Failure       Review Fernand has been doing well with exercise. [place]'s vital signs have been. 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 Perry's vital signs have been. 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 Inderjit has good attendance and participation in phase 2 cardiac rehab.             Comments: See ITP Comments.Barnet Pall, RN,BSN 05/09/2020 12:27 PM

## 2020-05-10 ENCOUNTER — Encounter (HOSPITAL_COMMUNITY)
Admission: RE | Admit: 2020-05-10 | Discharge: 2020-05-10 | Disposition: A | Discharge status: Home / Self Care | Payer: Medicare (Managed Care) | Source: Ambulatory Visit | Attending: Cardiovascular Disease | Admitting: Cardiovascular Disease

## 2020-05-10 ENCOUNTER — Other Ambulatory Visit: Payer: Self-pay

## 2020-05-10 DIAGNOSIS — Z9889 Other specified postprocedural states: Secondary | ICD-10-CM

## 2020-05-10 NOTE — Progress Notes (Signed)
Nutrition Note  Reviewed goals with pt. He altered his nighttime snack. He chose smaller portions and avoided desserts.  He feels successful with this goal. He thinks he ate excess sodium last week due to increased processed meat intake. Discussed plan for making better food choices this week. His weight was up 1 lb.   Pt verbalized understanding.  Nutrition Diagnosis   Obese  II = 35-39.9 related to excessive energy intake as evidenced by a BMI 37.84 kg/m2  Nutrition Intervention   Pt's individual nutrition plan reviewed with pt.  Benefits of adopting Heart Healthy diet discussed when Picture Your Plate reviewed.  Continue client-centered nutrition education by RD, as part of interdisciplinary care.  Goal(s)  Pt to identify food quantities necessary to achieve weight loss of 6-24 lb at graduation from cardiac rehab.   Pt to build a healthy plate including vegetables, fruits, whole grains, and low-fat dairy products in a heart healthy meal plan.  Pt to reduce added sugars to 38 g per day and limit refined carbohydrates  Plan:  Will provide client-centered nutrition education as part of interdisciplinary care  Monitor and evaluate progress toward nutrition goal with team.   Michaele Offer, MS, RDN, LDN

## 2020-05-12 ENCOUNTER — Other Ambulatory Visit: Payer: Self-pay

## 2020-05-12 ENCOUNTER — Encounter (HOSPITAL_COMMUNITY)
Admission: RE | Admit: 2020-05-12 | Discharge: 2020-05-12 | Disposition: A | Discharge status: Home / Self Care | Payer: Medicare (Managed Care) | Source: Ambulatory Visit | Attending: Cardiovascular Disease | Admitting: Cardiovascular Disease

## 2020-05-12 DIAGNOSIS — Z9889 Other specified postprocedural states: Secondary | ICD-10-CM | POA: Diagnosis not present

## 2020-05-15 ENCOUNTER — Encounter (HOSPITAL_COMMUNITY)
Admission: RE | Admit: 2020-05-15 | Discharge: 2020-05-15 | Disposition: A | Discharge status: Home / Self Care | Payer: Medicare (Managed Care) | Source: Ambulatory Visit | Attending: Cardiovascular Disease | Admitting: Cardiovascular Disease

## 2020-05-15 ENCOUNTER — Other Ambulatory Visit: Payer: Self-pay

## 2020-05-15 DIAGNOSIS — Z955 Presence of coronary angioplasty implant and graft: Secondary | ICD-10-CM | POA: Insufficient documentation

## 2020-05-15 DIAGNOSIS — Z9889 Other specified postprocedural states: Secondary | ICD-10-CM

## 2020-05-15 DIAGNOSIS — I214 Non-ST elevation (NSTEMI) myocardial infarction: Secondary | ICD-10-CM | POA: Insufficient documentation

## 2020-05-17 ENCOUNTER — Other Ambulatory Visit: Payer: Self-pay

## 2020-05-17 ENCOUNTER — Encounter (HOSPITAL_COMMUNITY)
Admission: RE | Admit: 2020-05-17 | Discharge: 2020-05-17 | Disposition: A | Discharge status: Home / Self Care | Payer: Medicare (Managed Care) | Source: Ambulatory Visit | Attending: Cardiovascular Disease | Admitting: Cardiovascular Disease

## 2020-05-17 DIAGNOSIS — Z955 Presence of coronary angioplasty implant and graft: Secondary | ICD-10-CM

## 2020-05-17 DIAGNOSIS — Z9889 Other specified postprocedural states: Secondary | ICD-10-CM

## 2020-05-17 DIAGNOSIS — I214 Non-ST elevation (NSTEMI) myocardial infarction: Secondary | ICD-10-CM

## 2020-05-17 NOTE — Progress Notes (Signed)
Reviewed home exercise Rx with patient today. Pt encouraged to walk 2-3 x/week for 30 minutes. Encouraged warm-up, cool-down, and stretching. Reviewed THRR of 57-114 and keeping RPE between 11-13. Hydration encouraged. Reviewed weather parameters for temperature and humidity for safe exercise outdoors. Reviewed S/S to terminate exercise and when to call MD vs 911. Pt verbalized understanding of the home exercise Rx and was provided a copy.   Lesly Rubenstein MS, ACSM-CEP, CCRP

## 2020-05-19 ENCOUNTER — Encounter (HOSPITAL_COMMUNITY)
Admission: RE | Admit: 2020-05-19 | Discharge: 2020-05-19 | Disposition: A | Discharge status: Home / Self Care | Payer: Medicare (Managed Care) | Source: Ambulatory Visit | Attending: Cardiovascular Disease | Admitting: Cardiovascular Disease

## 2020-05-19 ENCOUNTER — Other Ambulatory Visit: Payer: Self-pay

## 2020-05-19 DIAGNOSIS — I214 Non-ST elevation (NSTEMI) myocardial infarction: Secondary | ICD-10-CM

## 2020-05-19 DIAGNOSIS — Z9889 Other specified postprocedural states: Secondary | ICD-10-CM | POA: Diagnosis not present

## 2020-05-19 DIAGNOSIS — Z955 Presence of coronary angioplasty implant and graft: Secondary | ICD-10-CM

## 2020-05-22 ENCOUNTER — Encounter (HOSPITAL_COMMUNITY)
Admission: RE | Admit: 2020-05-22 | Discharge: 2020-05-22 | Disposition: A | Discharge status: Home / Self Care | Payer: Medicare (Managed Care) | Source: Ambulatory Visit | Attending: Cardiovascular Disease | Admitting: Cardiovascular Disease

## 2020-05-22 ENCOUNTER — Other Ambulatory Visit: Payer: Self-pay

## 2020-05-22 DIAGNOSIS — Z9889 Other specified postprocedural states: Secondary | ICD-10-CM

## 2020-05-23 ENCOUNTER — Other Ambulatory Visit: Payer: Self-pay | Admitting: Family Medicine

## 2020-05-23 ENCOUNTER — Other Ambulatory Visit: Payer: Self-pay | Admitting: Interventional Cardiology

## 2020-05-23 DIAGNOSIS — F321 Major depressive disorder, single episode, moderate: Secondary | ICD-10-CM

## 2020-05-23 DIAGNOSIS — R454 Irritability and anger: Secondary | ICD-10-CM

## 2020-05-24 ENCOUNTER — Encounter (HOSPITAL_COMMUNITY)
Admission: RE | Admit: 2020-05-24 | Discharge: 2020-05-24 | Disposition: A | Discharge status: Home / Self Care | Payer: Medicare (Managed Care) | Source: Ambulatory Visit | Attending: Cardiovascular Disease | Admitting: Cardiovascular Disease

## 2020-05-24 ENCOUNTER — Other Ambulatory Visit: Payer: Self-pay

## 2020-05-24 DIAGNOSIS — Z955 Presence of coronary angioplasty implant and graft: Secondary | ICD-10-CM

## 2020-05-24 DIAGNOSIS — Z9889 Other specified postprocedural states: Secondary | ICD-10-CM

## 2020-05-24 DIAGNOSIS — I214 Non-ST elevation (NSTEMI) myocardial infarction: Secondary | ICD-10-CM

## 2020-05-26 ENCOUNTER — Encounter (HOSPITAL_COMMUNITY)
Admission: RE | Admit: 2020-05-26 | Discharge: 2020-05-26 | Disposition: A | Discharge status: Home / Self Care | Payer: Medicare (Managed Care) | Source: Ambulatory Visit | Attending: Cardiovascular Disease | Admitting: Cardiovascular Disease

## 2020-05-26 ENCOUNTER — Other Ambulatory Visit: Payer: Self-pay

## 2020-05-26 DIAGNOSIS — Z9889 Other specified postprocedural states: Secondary | ICD-10-CM | POA: Diagnosis not present

## 2020-05-29 ENCOUNTER — Other Ambulatory Visit: Payer: Self-pay

## 2020-05-29 ENCOUNTER — Encounter (HOSPITAL_COMMUNITY)
Admission: RE | Admit: 2020-05-29 | Discharge: 2020-05-29 | Disposition: A | Discharge status: Home / Self Care | Payer: Medicare (Managed Care) | Source: Ambulatory Visit | Attending: Cardiovascular Disease | Admitting: Cardiovascular Disease

## 2020-05-29 DIAGNOSIS — Z9889 Other specified postprocedural states: Secondary | ICD-10-CM | POA: Diagnosis not present

## 2020-05-31 ENCOUNTER — Encounter (HOSPITAL_COMMUNITY)
Admission: RE | Admit: 2020-05-31 | Discharge: 2020-05-31 | Disposition: A | Discharge status: Home / Self Care | Payer: Medicare (Managed Care) | Source: Ambulatory Visit | Attending: Cardiovascular Disease | Admitting: Cardiovascular Disease

## 2020-05-31 ENCOUNTER — Other Ambulatory Visit: Payer: Self-pay

## 2020-05-31 DIAGNOSIS — Z9889 Other specified postprocedural states: Secondary | ICD-10-CM | POA: Diagnosis not present

## 2020-06-02 ENCOUNTER — Encounter (HOSPITAL_COMMUNITY)
Admission: RE | Admit: 2020-06-02 | Discharge: 2020-06-02 | Disposition: A | Discharge status: Home / Self Care | Payer: Medicare (Managed Care) | Source: Ambulatory Visit | Attending: Cardiovascular Disease | Admitting: Cardiovascular Disease

## 2020-06-02 ENCOUNTER — Telehealth: Payer: Self-pay | Admitting: Interventional Cardiology

## 2020-06-02 ENCOUNTER — Other Ambulatory Visit: Payer: Self-pay

## 2020-06-02 DIAGNOSIS — Z9889 Other specified postprocedural states: Secondary | ICD-10-CM

## 2020-06-02 NOTE — Telephone Encounter (Signed)
See MyChart message

## 2020-06-02 NOTE — Telephone Encounter (Signed)
New message    Patient is scheduled to see Dr Tamala Julian on 08-21-20.  He thinks he is also due for an echo.  If so, please put in order and we will call him and schedule it so that Dr Tamala Julian will have the results at his appointment.  Thank you.

## 2020-06-05 ENCOUNTER — Encounter (HOSPITAL_COMMUNITY)
Admission: RE | Admit: 2020-06-05 | Discharge: 2020-06-05 | Disposition: A | Discharge status: Home / Self Care | Payer: Medicare (Managed Care) | Source: Ambulatory Visit | Attending: Cardiovascular Disease | Admitting: Cardiovascular Disease

## 2020-06-05 ENCOUNTER — Other Ambulatory Visit: Payer: Self-pay

## 2020-06-05 DIAGNOSIS — Z9889 Other specified postprocedural states: Secondary | ICD-10-CM

## 2020-06-06 NOTE — Progress Notes (Signed)
Nutrition Note Spoke with pt. Weight has been maintained. Pt unsure of what else to do. He is consistently increasing exercise by walking at home and attending pulmonary rehab.  We reviewed diet recall.  Breakfast and lunch are small. Usually a small sandwich.  Dinner is [employer]. Pt thinks he goes back for seconds even when he isn't hungry. He states it being more psychological than physiological. Discussed strategies for mindful eating at dinner.  Nutrition Diagnosis   ObeseII = 35-39.9related to excessive energy intake as evidenced by a BMI 37.84 kg/m2  Nutrition Intervention   Pt's individual nutrition plan reviewed with pt.  Benefits of adopting Heart Healthy diet discussed when Picture Your Plate reviewed.  Continue client-centered nutrition education by RD, as part of interdisciplinary care.  Goal(s)  Pt to identify food quantities necessary to achieve weight loss of 6-24 lb at graduation from cardiac rehab.   Pt to build a healthy plate including vegetables, fruits, whole grains, and low-fat dairy products in a heart healthy meal plan.  Pt to reduce added sugars to 38 g per day and limit refined carbohydrates  Plan:  Will provide client-centered nutrition education as part of interdisciplinary care  Monitor and evaluate progress toward nutrition goal with team.   Michaele Offer, MS, RDN, LDN

## 2020-06-06 NOTE — Progress Notes (Signed)
Cardiac Individual Treatment Plan  Patient Details  Name: Raymond Christensen MRN: [id number] Date of Birth: 08/14/1940 Referring Provider:   Flowsheet Row CARDIAC REHAB PHASE II ORIENTATION from 04/18/2020 in [facility]  Referring Provider [name], MD      Initial Encounter Date:  [place] PHASE II ORIENTATION from 04/18/2020 in [facility]  Date 04/18/20      Visit Diagnosis: 12/23/19 S/P Mitraclip, S/P Ablation 02/01/20  Patient's Home Medications on Admission:  Current Outpatient Medications:  .  acetaminophen (TYLENOL) 500 MG tablet, Take 1,000 mg by mouth every 6 (six) hours as needed for moderate pain or headache., Disp: , Rfl:  .  amoxicillin (AMOXIL) 500 MG tablet, Take 4 tablets (2,000 mg total) one hour prior to all dental visits., Disp: 8 tablet, Rfl: 11 .  apixaban (ELIQUIS) 5 MG TABS tablet, Take 1 tablet (5 mg total) by mouth 2 (two) times daily., Disp: 180 tablet, Rfl: 3 .  atorvastatin (LIPITOR) 80 MG tablet, TAKE 1 TABLET DAILY, Disp: 90 tablet, Rfl: 2 .  buPROPion (WELLBUTRIN XL) 150 MG 24 hr tablet, TAKE ONE TABLET BY MOUTH DAILY, Disp: 30 tablet, Rfl: 3 .  carvedilol (COREG) 6.25 MG tablet, Take 1 tablet (6.25 mg total) by mouth 2 (two) times daily with a meal., Disp: 180 tablet, Rfl: 3 .  Cholecalciferol (VITAMIN D3) 250 MCG (10000 UT) capsule, Take 10,000 Units by mouth daily. , Disp: , Rfl:  .  clopidogrel (PLAVIX) 75 MG tablet, TAKE 1 TABLET DAILY WITH   BREAKFAST, Disp: 30 tablet, Rfl: 3 .  ezetimibe (ZETIA) 10 MG tablet, TAKE 1 TABLET DAILY, Disp: 90 tablet, Rfl: 3 .  famotidine (PEPCID) 20 MG tablet, Take 1 tablet (20 mg total) by mouth daily., Disp: 30 tablet, Rfl: 3 .  finasteride (PROSCAR) 5 MG tablet, TAKE 1 TABLET DAILY, Disp: 90 tablet, Rfl: 1 .  furosemide (LASIX) 40 MG tablet, Take 1 tablet (40 mg total) by mouth daily., Disp: 90 tablet, Rfl: 3 .  levocetirizine (XYZAL) 5  MG tablet, TAKE 1 TABLET EVERY EVENING, Disp: 90 tablet, Rfl: 1 .  levothyroxine (SYNTHROID) 75 MCG tablet, Take 1 tablet (75 mcg total) by mouth daily., Disp: 90 tablet, Rfl: 1 .  liothyronine (CYTOMEL) 5 MCG tablet, TAKE 2 TABLETS DAILY, Disp: 180 tablet, Rfl: 3 .  Multiple Vitamins-Minerals (MULTIVITAMIN ADULT EXTRA C PO), Take 1 tablet by mouth daily. , Disp: , Rfl:  .  nitroGLYCERIN (NITROSTAT) 0.4 MG SL tablet, Place 1 tablet (0.4 mg total) under the tongue every 5 (five) minutes as needed for chest pain., Disp: 30 tablet, Rfl: 12 .  omega-3 acid ethyl esters (LOVAZA) 1 g capsule, TAKE 2 CAPSULES TWICE DAILY, Disp: 360 capsule, Rfl: 2 .  potassium chloride (KLOR-CON) 10 MEQ tablet, TAKE 2 TABLETS DAILY AS    NEEDED WITH EVERY 40MG  DOSEOF LASIX, Disp: 90 tablet, Rfl: 0 .  sacubitril-valsartan (ENTRESTO) 24-26 MG, Take 1 tablet by mouth 2 (two) times daily., Disp: 180 tablet, Rfl: 3 .  spironolactone (ALDACTONE) 25 MG tablet, Take 0.5 tablets (12.5 mg total) by mouth daily., Disp: 135 tablet, Rfl: 3 .  tamsulosin (FLOMAX) 0.4 MG CAPS capsule, TAKE 1 CAPSULE DAILY (Patient taking differently: Take 0.4 mg by mouth in the morning and at bedtime.), Disp: 90 capsule, Rfl: 3 .  vitamin B-12 (CYANOCOBALAMIN) 1000 MCG tablet, Take 1,000 mcg by mouth daily., Disp: , Rfl:  .  zinc gluconate 50 MG  tablet, Take 50 mg by mouth daily., Disp: , Rfl:   Past Medical History: Past Medical History:  Diagnosis Date  . Arthritis   . CAD (coronary artery disease)   . Carotid artery disease ([place])    s/p left CEA 01/17/14  . Chronic systolic CHF (congestive heart failure) ([place])   . Concussion Summer 2012  . Essential hypertension 02/26/2017  . Heart disease   . Hyperlipidemia   . Hypothyroidism 10/15/2017  . Major depressive disorder   . Mitral valve regurgitation   . Myocardial infarction ([place]) 1996, 2021  . Obstructive sleep apnea on CPAP   . Persistent atrial fibrillation ([place])   . Poor flexibility of  tendon 12/01/2018  . 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 Burt Knack  . Subungual hematoma of digit of hand 12/01/2018  . Subungual hematoma of right foot 12/01/2018  . Thyroid disease   . Type 2 diabetes mellitus without complication, without long-term current use of insulin ([place]) 02/26/2017    Tobacco Use: Social History   Tobacco Use  Smoking Status Former Smoker  . Types: Cigarettes  . Quit date: 78  . Years since quitting: 42.4  Smokeless Tobacco Never Used    Labs: Recent Chemical engineer    Labs for ITP Cardiac and Pulmonary Rehab Latest Ref Rng & Units 09/09/2019 09/09/2019 09/09/2019 12/21/2019 04/24/2020   Cholestrol 100 - 199 mg/dL - - - - 110   LDLCALC 0 - 99 mg/dL - - - - 37   LDLDIRECT mg/dL - - - - -   HDL >39 mg/dL - - - - 42   Trlycerides 0 - 149 mg/dL - - - - 190(H)   Hemoglobin A1c 4.8 - 5.6 % - - - 6.7(H) -   PHART 7.350 - 7.450 - - 7.386 7.437 -   PCO2ART 32.0 - 48.0 mmHg - - 49.2(H) 36.9 -   HCO3 20.0 - 28.0 mmol/L 30.1(H) 29.9(H) 29.5(H) 24.4 -   TCO2 22 - 32 mmol/L 32 31 31 - -   O2SAT % 70.0 71.0 100.0 97.2 -      Capillary Blood Glucose: Lab Results  Component Value Date   GLUCAP 122 (H) 02/01/2020   GLUCAP 121 (H) 02/01/2020   GLUCAP 184 (H) 12/23/2019   GLUCAP 182 (H) 12/23/2019   GLUCAP 123 (H) 12/23/2019     Exercise Target Goals: Exercise Program Goal: Individual exercise prescription set using results from initial 6 min walk test and THRR while considering  patient's activity barriers and safety.   Exercise Prescription Goal: Starting with aerobic activity 30 plus minutes a day, 3 days per week for initial exercise prescription. Provide home exercise prescription and guidelines that participant acknowledges understanding prior to discharge.  Activity Barriers & Risk Stratification:  Activity Barriers & Cardiac Risk Stratification - 04/18/20 1426      Activity Barriers &  Cardiac Risk Stratification   Activity Barriers Balance Concerns    Cardiac Risk Stratification High           6 Minute Walk:  6 Minute Walk    Row Name 04/18/20 1425         6 Minute Walk   Phase Initial     Distance 1443 feet     Walk Time 6 minutes     # of Rest Breaks 0     MPH 2.73     METS 2.21     RPE 10  Perceived Dyspnea  0     VO2 Peak 7.73     Symptoms No     Resting HR 57 bpm     Resting BP 98/65     Resting Oxygen Saturation  97 %     Exercise Oxygen Saturation  during 6 min walk 98 %     Max Ex. HR 96 bpm     Max Ex. BP 150/70     2 Minute Post BP 127/67            Oxygen Initial Assessment:   Oxygen Re-Evaluation:   Oxygen Discharge (Final Oxygen Re-Evaluation):   Initial Exercise Prescription:  Initial Exercise Prescription - 04/18/20 1400      Date of Initial Exercise RX and Referring Provider   Date 04/18/20    Referring Provider [name], MD    Expected Discharge Date 06/16/20      NuStep   Level 2    SPM 75    Minutes 15    METs 2      Arm Ergometer   Level 1.8    Minutes 15    METs 2      Prescription Details   Frequency (times per week) 3    Duration Progress to 30 minutes of continuous aerobic without signs/symptoms of physical distress      Intensity   THRR 40-80% of Max Heartrate 57-114    Ratings of Perceived Exertion 11-13    Perceived Dyspnea 0-4      Progression   Progression Continue progressive overload as per policy without signs/symptoms or physical distress.      Resistance Training   Training Prescription Yes    Weight 3 lbs    Reps 10-15           Perform Capillary Blood Glucose checks as needed.  Exercise Prescription Changes:  Exercise Prescription Changes    Row Name 04/24/20 1000 05/03/20 1000 05/17/20 1000 05/24/20 1000       Response to Exercise   Blood Pressure (Admit) 107/52 138/70 102/58 98/52    Blood Pressure (Exercise) 124/72 134/68 122/78 124/60    Blood Pressure  (Exit) 102/58 120/60 104/78 110/60    Heart Rate (Admit) 72 bpm 64 bpm 61 bpm 61 bpm    Heart Rate (Exercise) 88 bpm 85 bpm 81 bpm 82 bpm    Heart Rate (Exit) 70 bpm 67 bpm 61 bpm 59 bpm    Rating of Perceived Exertion (Exercise) 11 12 12 11     Symptoms None None None None    Comments Pt's first day of exercise in the CRP2 program Reviewed METs Reviewed Home exercise Rx Reviewed METs and Goals    Duration Progress to 30 minutes of  aerobic without signs/symptoms of physical distress Continue with 30 min of aerobic exercise without signs/symptoms of physical distress. Continue with 30 min of aerobic exercise without signs/symptoms of physical distress. Continue with 30 min of aerobic exercise without signs/symptoms of physical distress.    Intensity THRR unchanged THRR unchanged THRR unchanged THRR unchanged         Progression   Progression Continue to progress workloads to maintain intensity without signs/symptoms of physical distress. Continue to progress workloads to maintain intensity without signs/symptoms of physical distress. Continue to progress workloads to maintain intensity without signs/symptoms of physical distress. Continue to progress workloads to maintain intensity without signs/symptoms of physical distress.    Average METs 2.1 2.2 2.3 2.5  Resistance Training   Training Prescription Yes No No No    Weight 3 lbs No weights on Wednesdays No weights on Wednesdays No weights on Wednesdays    Reps 10-15 -- -- --    Time 10 Minutes -- -- --         Interval Training   Interval Training No No No No         NuStep   Level 2 3 -- 4    SPM 75 85 85 85    Minutes 15 15 15 15     METs 2.3 2.2 2.5 2.5         Arm Ergometer   Level 1.5 15.5 2.5 2.5    Minutes 15 15 15 15     METs 1.8 2 2.1 2.2         Home Exercise Plan   Plans to continue exercise at -- -- Home (comment) Home (comment)    Frequency -- -- Add 2 additional days to program exercise sessions. Add 2  additional days to program exercise sessions.    Initial Home Exercises Provided -- -- 05/17/20 05/17/20           Exercise Comments:  Exercise Comments    Row Name 04/24/20 1011 05/03/20 1014 05/17/20 1044 05/24/20 1207     Exercise Comments Pt's first day of exericse in the CRP2 program. Pt had no complaints with todays session and is off to a good start. Reviewed METs today. Pt increased to level 3 on nustep today. Reviewed Home exercise Rx today. Pt verbalized understanding of the home exercise Rx and was provided a copy. Reviewed METs and Goals. Pt has not started walking at home as he has been doing yard fro his spouse and feels to tired to walk after these episodes of yard work. Encouraged to start by walking for 5-10 minutes and to build on that. Will follow porgress.           Exercise Goals and Review:  Exercise Goals    Row Name 04/18/20 1431             Exercise Goals   Increase Physical Activity Yes       Intervention Provide advice, education, support and counseling about physical activity/exercise needs.;Develop an individualized exercise prescription for aerobic and resistive training based on initial evaluation findings, risk stratification, comorbidities and participant's personal goals.       Expected Outcomes Short Term: Attend rehab on a regular basis to increase amount of physical activity.;Long Term: Add in home exercise to make exercise part of routine and to increase amount of physical activity.;Long Term: Exercising regularly at least 3-5 days a week.       Increase Strength and Stamina Yes       Intervention Provide advice, education, support and counseling about physical activity/exercise needs.;Develop an individualized exercise prescription for aerobic and resistive training based on initial evaluation findings, risk stratification, comorbidities and participant's personal goals.       Expected Outcomes Short Term: Increase workloads from initial exercise  prescription for resistance, speed, and METs.;Short Term: Perform resistance training exercises routinely during rehab and add in resistance training at home;Long Term: Improve cardiorespiratory fitness, muscular endurance and strength as measured by increased METs and functional capacity (6MWT)       Able to understand and use rate of perceived exertion (RPE) scale Yes       Intervention Provide education and explanation on how to use RPE scale  Expected Outcomes Short Term: Able to use RPE daily in rehab to express subjective intensity level;Long Term:  Able to use RPE to guide intensity level when exercising independently       Knowledge and understanding of Target Heart Rate Range (THRR) Yes       Intervention Provide education and explanation of THRR including how the numbers were predicted and where they are located for reference       Expected Outcomes Short Term: Able to state/look up THRR;Short Term: Able to use daily as guideline for intensity in rehab;Long Term: Able to use THRR to govern intensity when exercising independently       Understanding of Exercise Prescription Yes       Intervention Provide education, explanation, and written materials on patient's individual exercise prescription       Expected Outcomes Short Term: Able to explain program exercise prescription;Long Term: Able to explain home exercise prescription to exercise independently              Exercise Goals Re-Evaluation :  Exercise Goals Re-Evaluation    Row Name 04/24/20 1009 05/17/20 1043 05/24/20 1205         Exercise Goal Re-Evaluation   Exercise Goals Review Increase Physical Activity;Increase Strength and Stamina;Able to understand and use rate of perceived exertion (RPE) scale;Knowledge and understanding of Target Heart Rate Range (THRR);Understanding of Exercise Prescription Increase Physical Activity;Increase Strength and Stamina;Able to understand and use rate of perceived exertion (RPE)  scale;Knowledge and understanding of Target Heart Rate Range (THRR);Understanding of Exercise Prescription Increase Physical Activity;Increase Strength and Stamina;Able to understand and use rate of perceived exertion (RPE) scale;Knowledge and understanding of Target Heart Rate Range (THRR);Able to check pulse independently;Understanding of Exercise Prescription     Comments Pt's first day of exercise in the CRP2 program. Pt understands the exercise Rx, RPE scale, and THRR. Reviewed Home exercise Rx with patient today. Pt encouraged to walk 2-3x/week for 30 minutes. Reviewed METs and Goals. Pt feels stronger and has been doing yard work at home.     Expected Outcomes Will continue to monitor the patient and progress exercise workloads as tolerated. Pt will walk at home. Will continue to montior and progress as tolerated.             Discharge Exercise Prescription (Final Exercise Prescription Changes):  Exercise Prescription Changes - 05/24/20 1000      Response to Exercise   Blood Pressure (Admit) 98/52    Blood Pressure (Exercise) 124/60    Blood Pressure (Exit) 110/60    Heart Rate (Admit) 61 bpm    Heart Rate (Exercise) 82 bpm    Heart Rate (Exit) 59 bpm    Rating of Perceived Exertion (Exercise) 11    Symptoms None    Comments Reviewed METs and Goals    Duration Continue with 30 min of aerobic exercise without signs/symptoms of physical distress.    Intensity THRR unchanged      Progression   Progression Continue to progress workloads to maintain intensity without signs/symptoms of physical distress.    Average METs 2.5      Resistance Training   Training Prescription No    Weight No weights on Wednesdays      Interval Training   Interval Training No      NuStep   Level 4    SPM 85    Minutes 15    METs 2.5      Arm Ergometer   Level 2.5  Minutes 15    METs 2.2      Home Exercise Plan   Plans to continue exercise at Home (comment)    Frequency Add 2 additional  days to program exercise sessions.    Initial Home Exercises Provided 05/17/20           Nutrition:  Target Goals: Understanding of nutrition guidelines, daily intake of sodium 1500mg , cholesterol 200mg , calories 30% from fat and 7% or less from saturated fats, daily to have 5 or more servings of fruits and vegetables.  Biometrics:  Pre Biometrics - 04/18/20 1300      Pre Biometrics   Waist Circumference 47.5 inches    Hip Circumference 49 inches    Waist to Hip Ratio 0.97 %    Triceps Skinfold 15 mm    % Body Fat 35.5 %    Grip Strength 42 kg    Flexibility 0 in   Pt cannot reach   Single Leg Stand 2.68 seconds            Nutrition Therapy Plan and Nutrition Goals:  Nutrition Therapy & Goals - 05/01/20 1027      Nutrition Therapy   Diet Heart healthy/carb modified    Drug/Food Interactions Statins/Certain Fruits      Personal Nutrition Goals   Nutrition Goal Pt to identify food quantities necessary to achieve weight loss of 6-24 lb at graduation from cardiac rehab.    Personal Goal #2 Pt to build a healthy plate including vegetables, fruits, whole grains, and low-fat dairy products in a heart healthy meal plan.    Personal Goal #3 Pt to reduce added sugars to 38 g per day and limit refined carbohydrates      Intervention Plan   Intervention Prescribe, educate and counsel regarding individualized specific dietary modifications aiming towards targeted core components such as weight, hypertension, lipid management, diabetes, heart failure and other comorbidities.;Nutrition handout(s) given to patient.    Expected Outcomes Short Term Goal: Understand basic principles of dietary content, such as calories, fat, sodium, cholesterol and nutrients.;Long Term Goal: Adherence to prescribed nutrition plan.           Nutrition Assessments:  MEDIFICTS Score Key:  ?70 Need to make dietary changes   40-70 Heart Healthy Diet  ? 40 Therapeutic Level Cholesterol  Diet  Flowsheet Row CARDIAC REHAB PHASE II EXERCISE from 05/05/2020 in [facility]  Picture Your Plate Total Score on Admission 63     Picture Your Plate Scores:  <94 Unhealthy dietary pattern with much room for improvement.  41-50 Dietary pattern unlikely to meet recommendations for good health and room for improvement.  51-60 More healthful dietary pattern, with some room for improvement.   >60 Healthy dietary pattern, although there may be some specific behaviors that could be improved.    Nutrition Goals Re-Evaluation:  Nutrition Goals Re-Evaluation    [place] Name 05/01/20 1028 05/05/20 1201 06/05/20 1432         Goals   Current Weight 238 lb 4.8 oz (108.1 kg) 238 lb 5.1 oz (108.1 kg) 236 lb 1.8 oz (107.1 kg)     Nutrition Goal Pt to identify food quantities necessary to achieve weight loss of 6-24 lb at graduation from cardiac rehab. Pt to identify food quantities necessary to achieve weight loss of 6-24 lb at graduation from cardiac rehab. Pt to identify food quantities necessary to achieve weight loss of 6-24 lb at graduation from cardiac rehab.  Personal Goal #2 Re-Evaluation   Personal Goal #2 Pt to build a healthy plate including vegetables, fruits, whole grains, and low-fat dairy products in a heart healthy meal plan. Pt to build a healthy plate including vegetables, fruits, whole grains, and low-fat dairy products in a heart healthy meal plan. Pt to build a healthy plate including vegetables, fruits, whole grains, and low-fat dairy products in a heart healthy meal plan.           Personal Goal #3 Re-Evaluation   Personal Goal #3 Pt to reduce added sugars to 38 g per day and limit refined carbohydrates Pt to reduce added sugars to 38 g per day and limit refined carbohydrates Pt to reduce added sugars to 38 g per day and limit refined carbohydrates            Nutrition Goals Discharge (Final Nutrition Goals Re-Evaluation):   Nutrition Goals Re-Evaluation - 06/05/20 1432      Goals   Current Weight 236 lb 1.8 oz (107.1 kg)    Nutrition Goal Pt to identify food quantities necessary to achieve weight loss of 6-24 lb at graduation from cardiac rehab.      Personal Goal #2 Re-Evaluation   Personal Goal #2 Pt to build a healthy plate including vegetables, fruits, whole grains, and low-fat dairy products in a heart healthy meal plan.      Personal Goal #3 Re-Evaluation   Personal Goal #3 Pt to reduce added sugars to 38 g per day and limit refined carbohydrates           Psychosocial: Target Goals: Acknowledge presence or absence of significant depression and/or stress, maximize coping skills, provide positive support system. Participant is able to verbalize types and ability to use techniques and skills needed for reducing stress and depression.  Initial Review & Psychosocial Screening:  Initial Psych Review & Screening - 04/18/20 1510      Initial Review   Current issues with None Identified;History of Depression      Family Dynamics   Good Support System? Yes   Osmany has his wife for support   Comments Casimiro denies being depressed. Mohamud takes Wellbutrin      Barriers   Psychosocial barriers to participate in program There are no identifiable barriers or psychosocial needs.      Screening Interventions   Interventions Encouraged to exercise    Expected Outcomes Long Term Goal: Stressors or current issues are controlled or eliminated.           Quality of Life Scores:  Quality of Life - 04/18/20 1424      Quality of Life   Select Quality of Life      Quality of Life Scores   Health/Function Pre 21.5 %    Socioeconomic Pre 27.92 %    Psych/Spiritual Pre 21.14 %    Family Pre 28.5 %    GLOBAL Pre 23.72 %          Scores of 19 and below usually indicate a poorer quality of life in these areas.  A difference of  2-3 points is a clinically meaningful difference.  A difference of 2-3  points in the total score of the Quality of Life Index has been associated with significant improvement in overall quality of life, self-image, physical symptoms, and general health in studies assessing change in quality of life.  PHQ-9: Recent Review Flowsheet Data    Depression screen [facility] 2/9 04/18/2020 04/18/2020 12/17/2019 10/21/2019 06/02/2019   Decreased Interest  0 0 0 0 1   Down, Depressed, Hopeless 0 0 0 1 1   PHQ - 2 Score 0 0 0 1 2   Altered sleeping - - - - 0   Tired, decreased energy - - - - 1   Change in appetite - - - - 0   Feeling bad or failure about yourself  - - - - 0   Trouble concentrating - - - - 0   Moving slowly or fidgety/restless - - - - 0   Suicidal thoughts - - - - 0   PHQ-9 Score - - - - 3   Difficult doing work/chores - - - - Somewhat difficult     Interpretation of Total Score  Total Score Depression Severity:  1-4 = Minimal depression, 5-9 = Mild depression, 10-14 = Moderate depression, 15-19 = Moderately severe depression, 20-27 = Severe depression   Psychosocial Evaluation and Intervention:   Psychosocial Re-Evaluation:  Psychosocial Re-Evaluation    Row Name 05/09/20 1219 06/06/20 1332           Psychosocial Re-Evaluation   Current issues with History of Depression History of Depression      Comments [place] has some health stressors and has not voiced any increased stressors or concern [place]  has not voiced any increased stressors or concern      Expected Outcomes Will continue to monitor and offer suppport as needed Will continue to monitor and offer suppport as needed      Interventions Stress management education;Encouraged to attend Cardiac Rehabilitation for the exercise Stress management education;Encouraged to attend Cardiac Rehabilitation for the exercise      Continue Psychosocial Services  No Follow up required No Follow up required             Psychosocial Discharge (Final Psychosocial Re-Evaluation):  Psychosocial Re-Evaluation -  06/06/20 1332      Psychosocial Re-Evaluation   Current issues with History of Depression    Comments [place]  has not voiced any increased stressors or concern    Expected Outcomes Will continue to monitor and offer suppport as needed    Interventions Stress management education;Encouraged to attend Cardiac Rehabilitation for the exercise    Continue Psychosocial Services  No Follow up required           Vocational Rehabilitation: Provide vocational rehab assistance to qualifying candidates.   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 Participant will state understanding/return demonstration of topics presented.  Learning Barriers/Preferences:  Learning Barriers/Preferences - 04/18/20 1416      Learning Barriers/Preferences   Learning Barriers Hearing;Sight   Wears hearing aids and glasses   Learning Preferences Written Material;Video;Pictoral;Computer/Internet           Education Topics: Hypertension, Hypertension Reduction -Define heart disease and high blood pressure. Discus how high blood pressure affects the body and ways to reduce high blood pressure.   Exercise and Your Heart -Discuss why it is important to exercise, the FITT principles of exercise, normal and abnormal responses to exercise, and how to exercise safely.   Angina -Discuss definition of angina, causes of angina, treatment of  angina, and how to decrease risk of having angina.   Cardiac Medications -Review what the following cardiac medications are used for, how they affect the body, and side effects that may occur when taking the medications.   Medications include Aspirin, Beta blockers, calcium channel blockers, ACE Inhibitors, angiotensin receptor blockers, diuretics, digoxin, and antihyperlipidemics.   Congestive Heart Failure -Discuss the definition of CHF, how to live with CHF, the signs and symptoms of CHF, and how keep track of weight and sodium intake.   Heart Disease and Intimacy -Discus the effect sexual activity has on the heart, how changes occur during intimacy as we age, and safety during sexual activity.   Smoking Cessation / COPD -Discuss different methods to quit smoking, the health benefits of quitting smoking, and the definition of COPD.   Nutrition I: Fats -Discuss the types of cholesterol, what cholesterol does to the heart, and how cholesterol levels can be controlled.   Nutrition II: Labels -Discuss the different components of food labels and how to read food label   Heart Parts/Heart Disease and PAD -Discuss the anatomy of the heart, the pathway of blood circulation through the heart, and these are affected by heart disease.   Stress I: Signs and Symptoms -Discuss the causes of stress, how stress may lead to anxiety and depression, and ways to limit stress.   Stress II: Relaxation -Discuss different types of relaxation techniques to limit stress.   Warning Signs of Stroke / TIA -Discuss definition of a stroke, what the signs and symptoms are of a stroke, and how to identify when someone is having stroke.   Knowledge Questionnaire Score:  Knowledge Questionnaire Score - 04/18/20 1416      Knowledge Questionnaire Score   Pre Score 21/24           Core Components/Risk Factors/Patient Goals at Admission:  Personal Goals and Risk Factors at Admission - 04/18/20 1420      Core Components/Risk Factors/Patient Goals on Admission    Weight Management Yes;Obesity;Weight Loss    Intervention Weight Management: Develop a combined nutrition and exercise program designed to reach desired  caloric intake, while maintaining appropriate intake of nutrient and fiber, sodium and fats, and appropriate energy expenditure required for the weight goal.;Weight Management: Provide education and appropriate resources to help participant work on and attain dietary goals.;Weight Management/Obesity: Establish reasonable short term and long term weight goals.;Obesity: Provide education and appropriate resources to help participant work on and attain dietary goals.    Admit Weight 238 lb 1.6 oz (108 kg)    Expected Outcomes Short Term: Continue to assess and modify interventions until short term weight is achieved;Long Term: Adherence to nutrition and physical activity/exercise program aimed toward attainment of established weight goal;Weight Maintenance: Understanding of the daily nutrition guidelines, which includes 25-35% calories from fat, 7% or less cal from saturated fats, less than 200mg  cholesterol, less than 1.5gm of sodium, & 5 or more servings of fruits and vegetables daily;Weight Loss: Understanding of general recommendations for a balanced deficit meal plan, which promotes 1-2 lb weight loss per week and includes a negative energy balance of [phone] kcal/d;Understanding recommendations for meals to include 15-35% energy as protein, 25-35% energy from fat, 35-60% energy from carbohydrates, less than 200mg  of dietary cholesterol, 20-35 gm of total fiber daily;Understanding of distribution of calorie intake throughout the day with the consumption of 4-5 meals/snacks    Diabetes Yes    Intervention Provide education about signs/symptoms and action to take for hypo/hyperglycemia.;Provide education  about proper nutrition, including hydration, and aerobic/resistive exercise prescription along with prescribed medications to achieve blood glucose in normal ranges: Fasting glucose 65-99 mg/dL    Expected Outcomes Short Term: Participant verbalizes understanding of the signs/symptoms and immediate care of  hyper/hypoglycemia, proper foot care and importance of medication, aerobic/resistive exercise and nutrition plan for blood glucose control.;Long Term: Attainment of HbA1C < 7%.    Heart Failure Yes    Intervention Provide a combined exercise and nutrition program that is supplemented with education, support and counseling about heart failure. Directed toward relieving symptoms such as shortness of breath, decreased exercise tolerance, and extremity edema.    Hypertension Yes    Intervention Monitor prescription use compliance.;Provide education on lifestyle modifcations including regular physical activity/exercise, weight management, moderate sodium restriction and increased consumption of fresh fruit, vegetables, and low fat dairy, alcohol moderation, and smoking cessation.    Expected Outcomes Short Term: Continued assessment and intervention until BP is < 140/66mm HG in hypertensive participants. < 130/38mm HG in hypertensive participants with diabetes, heart failure or chronic kidney disease.;Long Term: Maintenance of blood pressure at goal levels.    Lipids Yes    Intervention Provide education and support for participant on nutrition & aerobic/resistive exercise along with prescribed medications to achieve LDL 70mg , HDL >40mg .    Expected Outcomes Short Term: Participant states understanding of desired cholesterol values and is compliant with medications prescribed. Participant is following exercise prescription and nutrition guidelines.;Long Term: Cholesterol controlled with medications as prescribed, with individualized exercise RX and with personalized nutrition plan. Value goals: LDL < 70mg , HDL > 40 mg.           Core Components/Risk Factors/Patient Goals Review:   Goals and Risk Factor Review    Row Name 05/09/20 1221 06/06/20 1332           Core Components/Risk Factors/Patient Goals Review   Personal Goals Review Weight Management/Obesity;Stress;Hypertension;Diabetes;Heart Failure  Weight Management/Obesity;Stress;Hypertension;Diabetes;Heart Failure      Review Treyveon has been doing well with exercise. Cezar's vital signs have been. 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 Demetri's vital signs have been. Nour continues to feel well and has more energy      Expected Outcomes Gracin will continue to participate in phase 2 cardiac rehab for exercise, nutrition and lifestyle modifications Reginal will continue to participate in phase 2 cardiac rehab for exercise, nutrition and lifestyle modifications             Core Components/Risk Factors/Patient Goals at Discharge (Final Review):   Goals and Risk Factor Review - 06/06/20 1332      Core Components/Risk Factors/Patient Goals Review   Personal Goals Review Weight Management/Obesity;Stress;Hypertension;Diabetes;Heart Failure    Review Artyom has been doing well with exercise. Lamontae's vital signs have been. Sirius continues to feel well and has more energy    Expected Outcomes Toris will continue to participate in phase 2 cardiac rehab for exercise, nutrition and lifestyle modifications           ITP Comments:  ITP Comments    Row Name 04/18/20 1509 05/09/20 1218 06/06/20 1331       ITP Comments Dr Fransico Him MD, Medical Director 30 Day ITP Review. Lou has good attendance and participation in phase 2 cardiac rehab. 30 Day ITP Review. Irbin continues to have  good attendance and participation in phase 2 cardiac rehab.  Comments: See ITP comments.Barnet Pall, RN,BSN 06/06/2020 1:35 PM

## 2020-06-07 ENCOUNTER — Other Ambulatory Visit: Payer: Self-pay

## 2020-06-07 ENCOUNTER — Encounter (HOSPITAL_COMMUNITY)
Admission: RE | Admit: 2020-06-07 | Discharge: 2020-06-07 | Disposition: A | Discharge status: Home / Self Care | Payer: Medicare (Managed Care) | Source: Ambulatory Visit | Attending: Cardiovascular Disease | Admitting: Cardiovascular Disease

## 2020-06-07 DIAGNOSIS — Z9889 Other specified postprocedural states: Secondary | ICD-10-CM

## 2020-06-07 DIAGNOSIS — Z955 Presence of coronary angioplasty implant and graft: Secondary | ICD-10-CM

## 2020-06-07 DIAGNOSIS — I214 Non-ST elevation (NSTEMI) myocardial infarction: Secondary | ICD-10-CM

## 2020-06-09 ENCOUNTER — Other Ambulatory Visit: Payer: Self-pay | Admitting: Family Medicine

## 2020-06-09 ENCOUNTER — Encounter (HOSPITAL_COMMUNITY): Payer: Medicare (Managed Care)

## 2020-06-14 ENCOUNTER — Other Ambulatory Visit: Payer: Self-pay

## 2020-06-14 ENCOUNTER — Encounter (HOSPITAL_COMMUNITY)
Admission: RE | Admit: 2020-06-14 | Discharge: 2020-06-14 | Disposition: A | Discharge status: Home / Self Care | Payer: Medicare (Managed Care) | Source: Ambulatory Visit | Attending: Cardiovascular Disease | Admitting: Cardiovascular Disease

## 2020-06-14 VITALS — Ht 66.5 in | Wt 236.3 lb

## 2020-06-14 DIAGNOSIS — Z9889 Other specified postprocedural states: Secondary | ICD-10-CM | POA: Insufficient documentation

## 2020-06-14 DIAGNOSIS — I214 Non-ST elevation (NSTEMI) myocardial infarction: Secondary | ICD-10-CM | POA: Diagnosis present

## 2020-06-14 DIAGNOSIS — Z955 Presence of coronary angioplasty implant and graft: Secondary | ICD-10-CM | POA: Insufficient documentation

## 2020-06-16 ENCOUNTER — Encounter (HOSPITAL_COMMUNITY)
Admission: RE | Admit: 2020-06-16 | Discharge: 2020-06-16 | Disposition: A | Discharge status: Home / Self Care | Payer: Medicare (Managed Care) | Source: Ambulatory Visit | Attending: Cardiovascular Disease | Admitting: Cardiovascular Disease

## 2020-06-16 ENCOUNTER — Other Ambulatory Visit: Payer: Self-pay

## 2020-06-16 DIAGNOSIS — I214 Non-ST elevation (NSTEMI) myocardial infarction: Secondary | ICD-10-CM

## 2020-06-16 DIAGNOSIS — Z955 Presence of coronary angioplasty implant and graft: Secondary | ICD-10-CM

## 2020-06-16 DIAGNOSIS — Z9889 Other specified postprocedural states: Secondary | ICD-10-CM | POA: Diagnosis not present

## 2020-06-19 ENCOUNTER — Other Ambulatory Visit: Payer: Self-pay

## 2020-06-19 ENCOUNTER — Encounter (HOSPITAL_COMMUNITY)
Admission: RE | Admit: 2020-06-19 | Discharge: 2020-06-19 | Disposition: A | Discharge status: Home / Self Care | Payer: Medicare (Managed Care) | Source: Ambulatory Visit | Attending: Cardiovascular Disease | Admitting: Cardiovascular Disease

## 2020-06-19 DIAGNOSIS — Z9889 Other specified postprocedural states: Secondary | ICD-10-CM

## 2020-06-19 DIAGNOSIS — Z955 Presence of coronary angioplasty implant and graft: Secondary | ICD-10-CM

## 2020-06-19 DIAGNOSIS — I5022 Chronic systolic (congestive) heart failure: Secondary | ICD-10-CM

## 2020-06-19 DIAGNOSIS — I214 Non-ST elevation (NSTEMI) myocardial infarction: Secondary | ICD-10-CM

## 2020-06-19 NOTE — Progress Notes (Signed)
Nutrition Note: Follow Up  Spoke with pt.  Weight has been maintained. He thinks he is eating too much sodium. He weighs at home almost daily. Reviewed high sodium foods he eats consistently.  Pt does not read labels. Discussed how this could help him reach his goals of weight loss and sodium reduction. We reviewed label reading and calories in meals and snacks.  Pt will keep a food log with serving sizes and sodium content for self monitoring.  He has been successful at reducing added sugar intake. He needs to incorporate more healthy fat sources. Discussed ways to do this. Pt verbalizes understanding.  Nutrition Diagnosis   ObeseII = 35-39.9related to excessive energy intake as evidenced by a BMI 37.84 kg/m2  Nutrition Intervention   Pt's individual nutrition plan reviewed with pt.  Benefits of adopting Heart Healthy diet discussed when Picture Your Plate reviewed.  Continue client-centered nutrition education by RD, as part of interdisciplinary care.  Goal(s)  Pt to identify food quantities necessary to achieve weight loss of 6-24 lb at graduation from cardiac rehab.   Pt to build a healthy plate including vegetables, fruits, whole grains, and low-fat dairy products in a heart healthy meal plan.  Pt to reduce added sugars to 38 g per day and limit refined carbohydrates  Plan:  Will provide client-centered nutrition education as part of interdisciplinary care  Monitor and evaluate progress toward nutrition goal with team.   Michaele Offer, MS, RDN, LDN

## 2020-06-21 ENCOUNTER — Encounter (HOSPITAL_COMMUNITY)
Admission: RE | Admit: 2020-06-21 | Discharge: 2020-06-21 | Disposition: A | Discharge status: Home / Self Care | Payer: Medicare (Managed Care) | Source: Ambulatory Visit | Attending: Cardiovascular Disease | Admitting: Cardiovascular Disease

## 2020-06-21 ENCOUNTER — Other Ambulatory Visit: Payer: Self-pay

## 2020-06-21 DIAGNOSIS — Z9889 Other specified postprocedural states: Secondary | ICD-10-CM | POA: Diagnosis not present

## 2020-06-21 DIAGNOSIS — I214 Non-ST elevation (NSTEMI) myocardial infarction: Secondary | ICD-10-CM

## 2020-06-21 DIAGNOSIS — Z955 Presence of coronary angioplasty implant and graft: Secondary | ICD-10-CM

## 2020-06-23 ENCOUNTER — Encounter (HOSPITAL_COMMUNITY)
Admission: RE | Admit: 2020-06-23 | Discharge: 2020-06-23 | Disposition: A | Discharge status: Home / Self Care | Payer: Medicare (Managed Care) | Source: Ambulatory Visit | Attending: Cardiovascular Disease | Admitting: Cardiovascular Disease

## 2020-06-23 ENCOUNTER — Other Ambulatory Visit: Payer: Self-pay

## 2020-06-23 DIAGNOSIS — I214 Non-ST elevation (NSTEMI) myocardial infarction: Secondary | ICD-10-CM

## 2020-06-23 DIAGNOSIS — Z9889 Other specified postprocedural states: Secondary | ICD-10-CM

## 2020-06-23 DIAGNOSIS — Z955 Presence of coronary angioplasty implant and graft: Secondary | ICD-10-CM

## 2020-06-23 NOTE — Progress Notes (Signed)
Discharge Progress Report  Patient Details  Name: Raymond Christensen MRN: [id number] Date of Birth: 1940-04-11 Referring Provider:   Flowsheet Row CARDIAC REHAB PHASE II ORIENTATION from 04/18/2020 in [place]  Referring Provider [name], MD        Number of Visits: 25  Reason for Discharge:  Patient reached a stable level of exercise. Patient independent in their exercise. Patient has met program and personal goals.  Smoking History:  Social History   Tobacco Use  Smoking Status Former   Pack years: 0.00   Types: Cigarettes   Quit date: 1980   Years since quitting: 42.4  Smokeless Tobacco Never    Diagnosis:  12/23/19 S/P Mitraclip, S/P Ablation 02/01/20  NSTEMI (non-ST elevated myocardial infarction) ([place]) 09/09/19  S/P DES SVG to OM2 and PDA 09/09/19  Status post coronary artery stent placement  ADL [facility]:   Initial Exercise Prescription:  Initial Exercise Prescription - 04/18/20 1400       Date of Initial Exercise RX and Referring Provider   Date 04/18/20    Referring Provider [name], MD    Expected Discharge Date 06/16/20      NuStep   Level 2    SPM 75    Minutes 15    METs 2      Arm Ergometer   Level 1.8    Minutes 15    METs 2      Prescription Details   Frequency (times per week) 3    Duration Progress to 30 minutes of continuous aerobic without signs/symptoms of physical distress      Intensity   THRR 40-80% of Max Heartrate 57-114    Ratings of Perceived Exertion 11-13    Perceived Dyspnea 0-4      Progression   Progression Continue progressive overload as per policy without signs/symptoms or physical distress.      Resistance Training   Training Prescription Yes    Weight 3 lbs    Reps 10-15             Discharge Exercise Prescription (Final Exercise Prescription Changes):  Exercise Prescription Changes - 06/23/20 1200       Response to Exercise   Blood Pressure (Admit) 110/50     Blood Pressure (Exercise) 144/68    Blood Pressure (Exit) 108/74    Heart Rate (Admit) 72 bpm    Heart Rate (Exercise) 81 bpm    Heart Rate (Exit) 71 bpm    Rating of Perceived Exertion (Exercise) 11    Symptoms None    Comments Pt graduated from the CRP2 program    Duration Continue with 30 min of aerobic exercise without signs/symptoms of physical distress.    Intensity THRR unchanged      Progression   Progression Continue to progress workloads to maintain intensity without signs/symptoms of physical distress.    Average METs 2.75      Resistance Training   Training Prescription Yes    Weight 5 lbs    Reps 10-15    Time 10 Minutes      Interval Training   Interval Training No      NuStep   Level 5    SPM 85    Minutes 15    METs 2.6      Arm Ergometer   Level 3    Minutes 15    METs 2.9      Home Exercise Plan   Plans to continue exercise at  Home (comment)    Frequency Add 2 additional days to program exercise sessions.    Initial Home Exercises Provided 05/17/20             Functional Capacity:  6 Minute Walk     Row Name 04/18/20 1425 06/14/20 0850       6 Minute Walk   Phase Initial Discharge    Distance 1443 feet 1641 feet    Distance % Change -- 13.72 %    Distance Feet Change -- 198 ft    Walk Time 6 minutes 6 minutes    # of Rest Breaks 0 0    MPH 2.73 3.11    METS 2.21 2.63    RPE 10 11    Perceived Dyspnea  0 0    VO2 Peak 7.73 9.22    Symptoms No No    Resting HR 57 bpm 68 bpm    Resting BP 98/65 100/50    Resting Oxygen Saturation  97 % 98 %    Exercise Oxygen Saturation  during 6 min walk 98 % 98 %    Max Ex. HR 96 bpm 99 bpm    Max Ex. BP 150/70 154/64    2 Minute Post BP 127/67 --             Psychological, QOL, Others - Outcomes: PHQ 2/9: Depression screen [facility] 2/9 06/23/2020 04/18/2020 04/18/2020 12/17/2019 10/21/2019  Decreased Interest 0 0 0 0 0  Down, Depressed, Hopeless 0 0 0 0 1  PHQ - 2 Score 0 0 0 0 1  Altered  sleeping - - - - -  Tired, decreased energy - - - - -  Change in appetite - - - - -  Feeling bad or failure about yourself  - - - - -  Trouble concentrating - - - - -  Moving slowly or fidgety/restless - - - - -  Suicidal thoughts - - - - -  PHQ-9 Score - - - - -  Difficult doing work/chores - - - - -  Some recent data might be hidden    Quality of Life:  Quality of Life - 06/19/20 1033       Quality of Life Scores   Health/Function Post 21.97 %    Socioeconomic Post 26.29 %    Psych/Spiritual Post 20.21 %    Family Post 26.4 %    GLOBAL Post 23.15 %             Personal Goals: Goals established at orientation with interventions provided to work toward goal.  Personal Goals and Risk Factors at Admission - 04/18/20 1420       Core Components/Risk Factors/Patient Goals on Admission    Weight Management Yes;Obesity;Weight Loss    Intervention Weight Management: Develop a combined nutrition and exercise program designed to reach desired caloric intake, while maintaining appropriate intake of nutrient and fiber, sodium and fats, and appropriate energy expenditure required for the weight goal.;Weight Management: Provide education and appropriate resources to help participant work on and attain dietary goals.;Weight Management/Obesity: Establish reasonable short term and long term weight goals.;Obesity: Provide education and appropriate resources to help participant work on and attain dietary goals.    Admit Weight 238 lb 1.6 oz (108 kg)    Expected Outcomes Short Term: Continue to assess and modify interventions until short term weight is achieved;Long Term: Adherence to nutrition and physical activity/exercise program aimed toward attainment of established weight goal;Weight Maintenance: Understanding of the  daily nutrition guidelines, which includes 25-35% calories from fat, 7% or less cal from saturated fats, less than $RemoveB'200mg'awkPMSfc$  cholesterol, less than 1.5gm of sodium, & 5 or more  servings of fruits and vegetables daily;Weight Loss: Understanding of general recommendations for a balanced deficit meal plan, which promotes 1-2 lb weight loss per week and includes a negative energy balance of [phone] kcal/d;Understanding recommendations for meals to include 15-35% energy as protein, 25-35% energy from fat, 35-60% energy from carbohydrates, less than $RemoveB'200mg'oYfZpjUX$  of dietary cholesterol, 20-35 gm of total fiber daily;Understanding of distribution of calorie intake throughout the day with the consumption of 4-5 meals/snacks    Diabetes Yes    Intervention Provide education about signs/symptoms and action to take for hypo/hyperglycemia.;Provide education about proper nutrition, including hydration, and aerobic/resistive exercise prescription along with prescribed medications to achieve blood glucose in normal ranges: Fasting glucose 65-99 mg/dL    Expected Outcomes Short Term: Participant verbalizes understanding of the signs/symptoms and immediate care of hyper/hypoglycemia, proper foot care and importance of medication, aerobic/resistive exercise and nutrition plan for blood glucose control.;Long Term: Attainment of HbA1C < 7%.    Heart Failure Yes    Intervention Provide a combined exercise and nutrition program that is supplemented with education, support and counseling about heart failure. Directed toward relieving symptoms such as shortness of breath, decreased exercise tolerance, and extremity edema.    Hypertension Yes    Intervention Monitor prescription use compliance.;Provide education on lifestyle modifcations including regular physical activity/exercise, weight management, moderate sodium restriction and increased consumption of fresh fruit, vegetables, and low fat dairy, alcohol moderation, and smoking cessation.    Expected Outcomes Short Term: Continued assessment and intervention until BP is < 140/16mm HG in hypertensive participants. < 130/39mm HG in hypertensive participants  with diabetes, heart failure or chronic kidney disease.;Long Term: Maintenance of blood pressure at goal levels.    Lipids Yes    Intervention Provide education and support for participant on nutrition & aerobic/resistive exercise along with prescribed medications to achieve LDL '70mg'$ , HDL >$Remo'40mg'HJiij$ .    Expected Outcomes Short Term: Participant states understanding of desired cholesterol values and is compliant with medications prescribed. Participant is following exercise prescription and nutrition guidelines.;Long Term: Cholesterol controlled with medications as prescribed, with individualized exercise RX and with personalized nutrition plan. Value goals: LDL < $Rem'70mg'xrBv$ , HDL > 40 mg.              Personal Goals Discharge:  Goals and Risk Factor Review     Row Name 05/09/20 1221 06/06/20 1332 06/23/20 1348         Core Components/Risk Factors/Patient Goals Review   Personal Goals Review Weight Management/Obesity;Stress;Hypertension;Diabetes;Heart Failure Weight Management/Obesity;Stress;Hypertension;Diabetes;Heart Failure Weight Management/Obesity;Stress;Hypertension;Diabetes;Heart Failure     Review Shomari has been doing well with exercise. Azarion's vital signs have been. Roper says that he feels stronger since his Mitral valve surgery Susie has been doing well with exercise. Rebel's vital signs have been. Khaleem continues to feel well and has more energy Hollice Espy did well with exercise. Jaiveer's vital signs have been. Sanjuan continues to feel well and has more energy. Len completed exercise at cardiac rehab on 06/23/20     Expected Outcomes Gloyd will continue to participate in phase 2 cardiac rehab for exercise, nutrition and lifestyle modifications Billyjack will continue to participate in phase 2 cardiac rehab for exercise, nutrition and lifestyle modifications Kerman will continue to  exercise, follow nutrition and lifestyle modifications upon completion of phase 2 cardiac rehab  Exercise Goals and Review:  Exercise Goals     Row Name 04/18/20 1431             Exercise Goals   Increase Physical Activity Yes       Intervention Provide advice, education, support and counseling about physical activity/exercise needs.;Develop an individualized exercise prescription for aerobic and resistive training based on initial evaluation findings, risk stratification, comorbidities and participant's personal goals.       Expected Outcomes Short Term: Attend rehab on a regular basis to increase amount of physical activity.;Long Term: Add in home exercise to make exercise part of routine and to increase amount of physical activity.;Long Term: Exercising regularly at least 3-5 days a week.       Increase Strength and Stamina Yes       Intervention Provide advice, education, support and counseling about physical activity/exercise needs.;Develop an individualized exercise prescription for aerobic and resistive training based on initial evaluation findings, risk stratification, comorbidities and participant's personal goals.       Expected Outcomes Short Term: Increase workloads from initial exercise prescription for resistance, speed, and METs.;Short Term: Perform resistance training exercises routinely during rehab and add in resistance training at home;Long Term: Improve cardiorespiratory fitness, muscular endurance and strength as measured by increased METs and functional capacity (6MWT)       Able to understand and use rate of perceived exertion (RPE) scale Yes       Intervention Provide education and explanation on how to use RPE scale       Expected Outcomes Short Term: Able to use RPE daily in rehab to express subjective intensity level;Long Term:  Able to use RPE to guide intensity level when exercising independently       Knowledge and understanding of Target Heart Rate Range (THRR) Yes       Intervention Provide education and explanation of THRR including how the  numbers were predicted and where they are located for reference       Expected Outcomes Short Term: Able to state/look up THRR;Short Term: Able to use daily as guideline for intensity in rehab;Long Term: Able to use THRR to govern intensity when exercising independently       Understanding of Exercise Prescription Yes       Intervention Provide education, explanation, and written materials on patient's individual exercise prescription       Expected Outcomes Short Term: Able to explain program exercise prescription;Long Term: Able to explain home exercise prescription to exercise independently                Exercise Goals Re-Evaluation:  Exercise Goals Re-Evaluation     Row Name 04/24/20 1009 05/17/20 1043 05/24/20 1205 06/23/20 1151       Exercise Goal Re-Evaluation   Exercise Goals Review Increase Physical Activity;Increase Strength and Stamina;Able to understand and use rate of perceived exertion (RPE) scale;Knowledge and understanding of Target Heart Rate Range (THRR);Understanding of Exercise Prescription Increase Physical Activity;Increase Strength and Stamina;Able to understand and use rate of perceived exertion (RPE) scale;Knowledge and understanding of Target Heart Rate Range (THRR);Understanding of Exercise Prescription Increase Physical Activity;Increase Strength and Stamina;Able to understand and use rate of perceived exertion (RPE) scale;Knowledge and understanding of Target Heart Rate Range (THRR);Able to check pulse independently;Understanding of Exercise Prescription Increase Physical Activity;Increase Strength and Stamina;Able to understand and use rate of perceived exertion (RPE) scale;Knowledge and understanding of Target Heart Rate Range (THRR);Able to check pulse independently;Understanding of Exercise Prescription    Comments Pt's  first day of exercise in the CRP2 program. Pt understands the exercise Rx, RPE scale, and THRR. Reviewed Home exercise Rx with patient today. Pt  encouraged to walk 2-3x/week for 30 minutes. Reviewed METs and Goals. Pt feels stronger and has been doing yard work at home. Pt graduated from the [facility] program today. Pt made good progress and had an avewrage MET level of 2.7. Pt will walk and swim at home.    Expected Outcomes Will continue to monitor the patient and progress exercise workloads as tolerated. Pt will walk at home. Will continue to montior and progress as tolerated. Pt will continue to exercise at home on his own 3-5x/week for 30 minuteds.             Nutrition & Weight - Outcomes:  Pre Biometrics - 04/18/20 1300       Pre Biometrics   Waist Circumference 47.5 inches    Hip Circumference 49 inches    Waist to Hip Ratio 0.97 %    Triceps Skinfold 15 mm    % Body Fat 35.5 %    Grip Strength 42 kg    Flexibility 0 in   Pt cannot reach   Single Leg Stand 2.68 seconds             Post Biometrics - 06/14/20 0940        Post  Biometrics   Height 5' 6.5" (1.689 m)    Weight 107.2 kg    Waist Circumference 47 inches    Hip Circumference 49 inches    Waist to Hip Ratio 0.96 %    BMI (Calculated) 37.58    Triceps Skinfold 16 mm    % Body Fat 35.4 %    Grip Strength 42 kg    Flexibility 0 in   unable to reach   Single Leg Stand 3.28 seconds             Nutrition:  Nutrition Therapy & Goals - 05/01/20 1027       Nutrition Therapy   Diet Heart healthy/carb modified    Drug/Food Interactions Statins/Certain Fruits      Personal Nutrition Goals   Nutrition Goal Pt to identify food quantities necessary to achieve weight loss of 6-24 lb at graduation from cardiac rehab.    Personal Goal #2 Pt to build a healthy plate including vegetables, fruits, whole grains, and low-fat dairy products in a heart healthy meal plan.    Personal Goal #3 Pt to reduce added sugars to 38 g per day and limit refined carbohydrates      Intervention Plan   Intervention Prescribe, educate and counsel regarding individualized  specific dietary modifications aiming towards targeted core components such as weight, hypertension, lipid management, diabetes, heart failure and other comorbidities.;Nutrition handout(s) given to patient.    Expected Outcomes Short Term Goal: Understand basic principles of dietary content, such as calories, fat, sodium, cholesterol and nutrients.;Long Term Goal: Adherence to prescribed nutrition plan.             Nutrition Discharge:   Education Questionnaire Score:  Knowledge Questionnaire Score - 06/19/20 1027       Knowledge Questionnaire Score   Post Score 24/24             Goals reviewed with patient; copy given to patient.Pt graduated from cardiac rehab program on 06/23/20 with completion of  exercise sessions in Phase II. Pt maintained good attendance and progressed nicely during his participation in rehab as evidenced by  increased MET level.   Medication list reconciled. Repeat  PHQ score-  0.  Pt has made significant lifestyle changes and should be commended for his success. Pt feels he has achieved his goals during cardiac rehab.   Pt plans to continue exercise by walking and may consider swimming in the future.Trever increased his distance on his post exercise walk test by 198 feet. We are proud of Kinley's progress. Tiago reported feeling stronger and has more energy. Barnet Pall, RN,BSN 06/29/2020 11:22 AM

## 2020-06-23 NOTE — Progress Notes (Signed)
Nutrition Note Reviewed food log/sodium intake. Pt eats 2 meals and 1 snack daily. Occasionally eats out.  Most days sodium intake is below 2000 mg per food log report. His meals are higher carb and lower protein. Discussed choosing higher protein meals and snacks and incorporating adequate exercise to reach weight loss goals..  Pt verbalized understanding.  Nutrition Diagnosis   Obese  II = 35-39.9 related to excessive energy intake as evidenced by a BMI 37.84 kg/m2   Nutrition Intervention   Pt's individual nutrition plan reviewed with pt. Benefits of adopting Heart Healthy diet discussed when Picture Your Plate reviewed. Continue client-centered nutrition education by RD, as part of interdisciplinary care.   Goal(s) Pt to identify food quantities necessary to achieve weight loss of 6-24 lb at graduation from cardiac rehab. Pt to build a healthy plate including vegetables, fruits, whole grains, and low-fat dairy products in a heart healthy meal plan. Pt to reduce added sugars to 38 g per day and limit refined carbohydrates   Plan:  Will provide client-centered nutrition education as part of interdisciplinary care Monitor and evaluate progress toward nutrition goal with team.     Michaele Offer, MS, RDN, LDN

## 2020-06-24 ENCOUNTER — Other Ambulatory Visit: Payer: Self-pay | Admitting: Cardiovascular Disease

## 2020-06-24 DIAGNOSIS — R600 Localized edema: Secondary | ICD-10-CM

## 2020-06-24 NOTE — Telephone Encounter (Signed)
Please check BMET and BNP. Will help to determine if diuretics are causing dehydration.  Bruising will get better when/if we stop Plavix in August. However, if we stop plavix there will be a slight increase in the risk of stents clotting. The Eliquis cant be stopped due to atrial fib.

## 2020-06-26 ENCOUNTER — Other Ambulatory Visit: Payer: Self-pay

## 2020-06-26 ENCOUNTER — Other Ambulatory Visit: Payer: Medicare (Managed Care)

## 2020-06-26 DIAGNOSIS — I5022 Chronic systolic (congestive) heart failure: Secondary | ICD-10-CM

## 2020-06-26 MED ORDER — POTASSIUM CHLORIDE ER 10 MEQ PO TBCR: EXTENDED_RELEASE_TABLET | ORAL | 2 refills | Status: DC

## 2020-06-26 NOTE — Telephone Encounter (Signed)
Refill request

## 2020-06-27 LAB — BASIC METABOLIC PANEL
BUN/Creatinine Ratio: 16 (ref 10–24)
BUN: 20 mg/dL (ref 8–27)
CO2: 25 mmol/L (ref 20–29)
Calcium: 9.3 mg/dL (ref 8.6–10.2)
Chloride: 104 mmol/L (ref 96–106)
Creatinine, Ser: 1.23 mg/dL (ref 0.76–1.27)
Glucose: 96 mg/dL (ref 65–99)
Potassium: 5.1 mmol/L (ref 3.5–5.2)
Sodium: 141 mmol/L (ref 134–144)
eGFR: 60 mL/min/{1.73_m2} (ref >59)

## 2020-06-27 LAB — PRO B NATRIURETIC PEPTIDE: NT-Pro BNP: 137 pg/mL (ref 0–486)

## 2020-07-11 ENCOUNTER — Other Ambulatory Visit: Payer: Self-pay | Admitting: Interventional Cardiology

## 2020-07-11 ENCOUNTER — Other Ambulatory Visit: Payer: Self-pay | Admitting: Family Medicine

## 2020-07-18 ENCOUNTER — Ambulatory Visit: Payer: Medicare (Managed Care)

## 2020-07-20 ENCOUNTER — Ambulatory Visit: Payer: Medicare (Managed Care)

## 2020-07-24 ENCOUNTER — Other Ambulatory Visit: Payer: Self-pay

## 2020-07-24 ENCOUNTER — Ambulatory Visit (INDEPENDENT_AMBULATORY_CARE_PROVIDER_SITE_OTHER): Payer: Medicare (Managed Care)

## 2020-07-24 VITALS — BP 128/68 | HR 63 | Temp 97.3°F | Resp 16 | Ht 68.0 in | Wt 238.6 lb

## 2020-07-24 DIAGNOSIS — Z0000 Encounter for general adult medical examination without abnormal findings: Secondary | ICD-10-CM | POA: Diagnosis not present

## 2020-07-24 NOTE — Progress Notes (Signed)
Subjective:   Raymond Christensen is a 80 y.o. male who presents for Medicare Annual/Subsequent preventive examination.  Review of Systems     Cardiac Risk Factors include: advanced age (>2men, >58 women);male gender;diabetes mellitus;dyslipidemia;hypertension;obesity (BMI >30kg/m2)     Objective:    Today's Vitals   07/24/20 1456  BP: 128/68  Pulse: 63  Resp: 16  Temp: (!) 97.3 F (36.3 C)  TempSrc: Temporal  SpO2: 97%  Weight: 238 lb 9.6 oz (108.2 kg)  Height: 5\' 8"  (1.727 m)   Body mass index is 36.28 kg/m.  Advanced Directives 07/24/2020 02/01/2020 12/21/2019 12/21/2019 11/08/2019 09/10/2019 09/07/2019  Does Patient Have a Medical Advance Directive? Yes Yes Yes Yes Yes No No  Type of Paramedic of [place];Living will [place];Living will [place];Living will [place];Living will [place] - -  Does patient want to make changes to medical advance directive? - No - Patient declined - No - Patient declined - - -  Copy of [place] in Chart? Yes - validated most recent copy scanned in chart (See row information) No - copy requested Yes - validated most recent copy scanned in chart (See row information) Yes - validated most recent copy scanned in chart (See row information) No - copy requested - -  Would patient like information on creating a medical advance directive? - - - - - No - Patient declined -    Current Medications (verified) Outpatient Encounter Medications as of 07/24/2020  Medication Sig   acetaminophen (TYLENOL) 500 MG tablet Take 1,000 mg by mouth every 6 (six) hours as needed for moderate pain or headache.   amoxicillin (AMOXIL) 500 MG tablet Take 4 tablets (2,000 mg total) one hour prior to all dental visits.   apixaban (ELIQUIS) 5 MG TABS tablet Take 1 tablet (5 mg total) by mouth 2 (two) times daily.   atorvastatin (LIPITOR) 80 MG tablet TAKE 1  TABLET DAILY   buPROPion (WELLBUTRIN XL) 150 MG 24 hr tablet TAKE ONE TABLET BY MOUTH DAILY   carvedilol (COREG) 6.25 MG tablet Take 1 tablet (6.25 mg total) by mouth 2 (two) times daily with a meal.   Cholecalciferol (VITAMIN D3) 250 MCG (10000 UT) capsule Take 10,000 Units by mouth daily.    clopidogrel (PLAVIX) 75 MG tablet TAKE 1 TABLET DAILY WITH   BREAKFAST   doxycycline (VIBRAMYCIN) 100 MG capsule Take 100 mg by mouth 2 (two) times daily.   ezetimibe (ZETIA) 10 MG tablet TAKE 1 TABLET DAILY   famotidine (PEPCID) 20 MG tablet Take 1 tablet (20 mg total) by mouth daily.   finasteride (PROSCAR) 5 MG tablet TAKE 1 TABLET DAILY   furosemide (LASIX) 40 MG tablet Take 1 tablet (40 mg total) by mouth daily.   levocetirizine (XYZAL) 5 MG tablet TAKE 1 TABLET EVERY EVENING   levothyroxine (SYNTHROID) 75 MCG tablet TAKE 1 TABLET DAILY   liothyronine (CYTOMEL) 5 MCG tablet TAKE 2 TABLETS DAILY   Multiple Vitamins-Minerals (MULTIVITAMIN ADULT EXTRA C PO) Take 1 tablet by mouth daily.    nitroGLYCERIN (NITROSTAT) 0.4 MG SL tablet Place 1 tablet (0.4 mg total) under the tongue every 5 (five) minutes as needed for chest pain.   potassium chloride (KLOR-CON) 10 MEQ tablet TAKE 2 TABLETS DAILY AS    NEEDED WITH EVERY 40MG  DOSEOF LASIX   sacubitril-valsartan (ENTRESTO) 24-26 MG Take 1 tablet by mouth 2 (two) times daily.   spironolactone (ALDACTONE)  25 MG tablet Take 0.5 tablets (12.5 mg total) by mouth daily.   tamsulosin (FLOMAX) 0.4 MG CAPS capsule TAKE 1 CAPSULE DAILY (Patient taking differently: Take 0.4 mg by mouth in the morning and at bedtime.)   vitamin B-12 (CYANOCOBALAMIN) 1000 MCG tablet Take 1,000 mcg by mouth daily.   zinc gluconate 50 MG tablet Take 50 mg by mouth daily.   omega-3 acid ethyl esters (LOVAZA) 1 g capsule TAKE 2 CAPSULES TWICE DAILY (Patient not taking: No sig reported)   No facility-administered encounter medications on file as of 07/24/2020.    Allergies (verified) Sulfa  antibiotics   History: Past Medical History:  Diagnosis Date   Arthritis    CAD (coronary artery disease)    Carotid artery disease ([place])    s/p left CEA 07/19/20   Chronic systolic CHF (congestive heart failure) ([place])    Concussion Summer 2012   Essential hypertension 02/26/2017   Heart disease    Hyperlipidemia    Hypothyroidism 10/15/2017   Major depressive disorder    Mitral valve regurgitation    Myocardial infarction ([place]) 1996, 2021   Obstructive sleep apnea on CPAP    Persistent atrial fibrillation ([facility])    Poor flexibility of tendon 12/01/2018   S/P mitral valve clip implantation 12/23/2019   s/p TEER with MitraClip with one XTW by Dr. Burt Knack   Subungual hematoma of digit of hand 12/01/2018   Subungual hematoma of right foot 12/01/2018   Thyroid disease    Type 2 diabetes mellitus without complication, without long-term current use of insulin ([place]) 02/26/2017   Past Surgical History:  Procedure Laterality Date   ATRIAL FIBRILLATION ABLATION N/A 02/01/2020   Procedure: ATRIAL FIBRILLATION ABLATION;  Surgeon: [name], MD;  Location: [facility];  Service: Cardiovascular;  Laterality: N/A;   BYPASS GRAFT  2001   CARDIAC CATHETERIZATION     CARDIOVERSION N/A 11/08/2019   Procedure: CARDIOVERSION;  Surgeon: [name], MD;  Location: [facility];  Service: Cardiovascular;  Laterality: N/A;   CAROTID ENDARTERECTOMY Left 01/17/2014   CORONARY ANGIOPLASTY     CORONARY ARTERY BYPASS GRAFT     MITRAL VALVE REPAIR N/A 12/23/2019   Procedure: MITRAL VALVE REPAIR;  Surgeon: [name], MD;  Location: [facility];  Service: Cardiovascular;  Laterality: N/A;   RIGHT/LEFT HEART CATH AND CORONARY/GRAFT ANGIOGRAPHY N/A 09/09/2019   Procedure: RIGHT/LEFT HEART CATH AND CORONARY/GRAFT ANGIOGRAPHY;  Surgeon: [name], MD;  Location: [facility];  Service: Cardiovascular;  Laterality: N/A;   TEE WITHOUT CARDIOVERSION N/A 09/13/2019    Procedure: TRANSESOPHAGEAL ECHOCARDIOGRAM (TEE);  Surgeon: [name], MD;  Location[facility];  Service: Cardiovascular;  Laterality: N/A;   TEE WITHOUT CARDIOVERSION N/A 12/23/2019   Procedure: TRANSESOPHAGEAL ECHOCARDIOGRAM (TEE);  Surgeon: [name], MD;  Location: [place] [facility];  Service: Cardiovascular;  Laterality: N/A;   TEE WITHOUT CARDIOVERSION  02/01/2020   Procedure: TRANSESOPHAGEAL ECHOCARDIOGRAM (TEE);  Surgeon: [name], MD;  Location: [facility];  Service: Cardiovascular;;   TOTAL HIP ARTHROPLASTY  1994, 1995   Family History  Problem Relation Age of Onset   Dementia Father        Concerns surrounding Lewy body dementia, but never officially diagnosed   Cancer Neg Hx    Social History   Socioeconomic History   Marital status: Married    Spouse name: Not on file   Number of children: 2   Years of education: 17  Highest education level: Doctorate  Occupational History   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  Tobacco Use   Smoking status: Former    Pack years: 0.00    Types: Cigarettes    Quit date: 1980    Years since quitting: 42.5   Smokeless tobacco: Never  Vaping Use   Vaping Use: Never used  Substance and Sexual Activity   Alcohol use: Yes    Alcohol/week: 4.0 standard drinks    Types: 1 Cans of beer, 3 Standard drinks or equivalent per week    Comment: 1 drink 3 times per week   Drug use: No   Sexual activity: Yes  Other Topics Concern   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 bring in CBG monitor from home?: No  How often do you need to have someone help you when you read instructions, pamphlets, or other written materials from your doctor or pharmacy?: 1 - Never  Diabetes:  Is the patient diabetic?  Yes  If diabetic, was a CBG obtained today?  No  Did the patient bring in their glucometer from home?  No  How often do you monitor your CBG's? never.   Financial Strains and Diabetes Management:  Are you having any financial strains with the device, your supplies or your medication? No .  Does the patient want to be seen by Chronic Care Management for management of their diabetes?  No  Would the patient like to be referred to a Nutritionist or for Diabetic Management?  Yes   Diabetic Exams:  Diabetic Eye Exam: Completed 03/28/2020.   Diabetic Foot Exam: Pt has been advised about the importance in completing this exam. To be completed by PCP.    Interpreter Needed?: No  Information entered by :: Caroleen Hamman LPN   Activities of Daily Living In your present state of health, do you have any difficulty performing the following activities: 07/24/2020  Hearing? N  Vision? N  Difficulty concentrating or making decisions? N  Walking or climbing stairs? N  Dressing or bathing? N  Doing errands, shopping? N  Preparing Food and eating ? N  Using the Toilet? N  In the past six months, have you accidently leaked urine? N  Do you have problems with loss of bowel control? N  Managing your Medications? N  Managing your Finances? N  Housekeeping or managing your Housekeeping? N  Some recent data might be hidden    Patient Care Team: [name], DO as PCP - General (Family Medicine) Belva Crome, MD as PCP - Cardiology (Cardiology) [name], MD as PCP - Electrophysiology (Cardiology) Cameron Sprang, MD as Consulting Physician (Neurology)  Indicate any recent Medical Services you may have received from other than [facility] providers in the past year (  date may be approximate).     Assessment:   This is a routine wellness examination for [place].  Hearing/Vision screen Hearing Screening - Comments:: No issues Vision Screening - Comments:: Last eye exam-05/2020-Dr. Bowen  Dietary issues and exercise activities discussed: Current Exercise Habits: Home exercise routine, Type of exercise: walking, Time (Minutes): 20, Frequency (Times/Week): 3, Weekly Exercise (Minutes/Week): 60, Intensity: Mild, Exercise limited by: None identified   Goals Addressed             This Visit's Progress    Increase physical activity   On track    Patient Stated       Drink more water        Depression Screen PHQ 2/9 Scores 07/24/2020 06/23/2020 04/18/2020 04/18/2020 12/17/2019 10/21/2019 06/02/2019  PHQ - 2 Score 0 0 0 0 0 1 2  PHQ- 9 Score - - - - - - 3    Fall Risk Fall Risk  07/24/2020 04/18/2020 10/21/2019 06/02/2019 02/04/2019  Falls in the past year? 1 0 0 0 0  Number falls in past yr: 1 0 0 0 0  Injury with Fall? 0 0 0 0 0  Risk for fall due to : History of fall(s) Impaired balance/gait Other (Comment) - -  Risk for fall due to: Comment - - Pt obtained 2.37 sconds on balance test - -  Follow up Falls prevention discussed Falls evaluation completed Falls evaluation completed - Education provided;Falls prevention discussed    FALL RISK PREVENTION PERTAINING TO THE HOME:  Any stairs in or around the home? No  Home free of loose throw rugs in walkways, pet beds, electrical cords, etc? Yes  Adequate lighting in your home to reduce risk of falls? Yes   ASSISTIVE DEVICES UTILIZED TO PREVENT FALLS:  Life alert? No  Use of a cane, walker or w/c? No  Grab bars in the bathroom? No  Shower chair or bench in shower? No  Elevated toilet seat  or a handicapped toilet? No   TIMED UP AND GO:  Was the test performed? Yes .  Length of time to ambulate 10 feet: 10 sec.   Gait steady and fast without use of assistive device  Cognitive Function:Normal cognitive status assessed by direct observation by this Nurse Health Advisor. No abnormalities found.   MMSE - Mini Mental State Exam 01/12/2018  Orientation to time 5  Orientation to Place 5  Registration 3  Attention/ Calculation 5  Recall 2  Language- name 2 objects 2  Language- repeat 1  Language- follow 3 step command 3  Language- read & follow direction 1  Write a sentence 1  Copy design 1  Total score 29   Montreal Cognitive Assessment  03/19/2018  Visuospatial/ Executive (0/5) 5  Naming (0/3) 3  Attention: Read list of digits (0/2) 2  Attention: Read list of letters (0/1) 0  Attention: Serial 7 subtraction starting at 100 (0/3) 2  Language: Repeat phrase (0/2) 2  Language : Fluency (0/1) 1  Abstraction (0/2) 2  Delayed Recall (0/5) 0  Orientation (0/6) 6  Total 23      Immunizations Immunization History  Administered Date(s) Administered   Influenza, High Dose Seasonal PF 10/04/2019   Influenza-Unspecified 10/26/2017, 09/15/2018   PFIZER(Purple Top)SARS-COV-2 Vaccination 01/24/2019, 02/14/2019, 04/25/2020   Pneumococcal Conjugate-13 03/28/2009   Pneumococcal Polysaccharide-23 11/22/2002, 03/28/2009, 01/02/2015   Tdap 02/09/2018   Zoster Recombinat (Shingrix) 06/12/2016, 06/12/2016, 08/26/2016   Zoster, Live 11/29/2008    TDAP status: Up to date  Flu Vaccine status: Up to date  Pneumococcal vaccine status: Up to date  Covid-19 vaccine status: Completed vaccines  Qualifies for Shingles Vaccine? No   Zostavax completed Yes   Shingrix Completed?: Yes  Screening Tests Health Maintenance  Topic Date Due   Hepatitis C Screening  Never done   HEMOGLOBIN A1C  06/20/2020   FOOT EXAM  06/21/2020   COVID-19 Vaccine (4 - Booster for Pfizer series)  07/25/2020   INFLUENZA VACCINE  08/14/2020   OPHTHALMOLOGY EXAM  03/28/2021   PNA vac Low Risk Adult  Completed   Zoster Vaccines- Shingrix  Completed   HPV VACCINES  Aged Out   TETANUS/TDAP  Discontinued    Health Maintenance  Health Maintenance Due  Topic Date Due   Hepatitis C Screening  Never done   HEMOGLOBIN A1C  06/20/2020   FOOT EXAM  06/21/2020   COVID-19 Vaccine (4 - Booster for [place] series) 07/25/2020    Colorectal cancer screening: No longer required.   Lung Cancer Screening: (Low Dose CT Chest recommended if Age 31-80 years, 30 pack-year currently smoking OR have quit w/in 15years.) does not qualify.     Additional Screening:  Hepatitis C Screening: does not qualify  Vision Screening: Recommended annual ophthalmology exams for early detection of glaucoma and other disorders of the eye. Is the patient up to date with their annual eye exam?  Yes  Who is the provider or what is the name of the office in which the patient attends annual eye exams? Dr. Valetta Close   Dental Screening: Recommended annual dental exams for proper oral hygiene  Community Resource Referral / Chronic Care Management: CRR required this visit?  No   CCM required this visit?  No      Plan:     I have personally reviewed and noted the following in the patient's chart:   Medical and social history Use of alcohol, tobacco or illicit drugs  Current medications and supplements including opioid prescriptions. Patient is not currently taking opioid prescriptions. Functional ability and status Nutritional status Physical activity Advanced directives List of other physicians Hospitalizations, surgeries, and ER visits in previous 12 months Vitals Screenings to include cognitive, depression, and falls Referrals and appointments  In addition, I have reviewed and discussed with patient certain preventive protocols, quality metrics, and best practice recommendations. A written personalized  care plan for preventive services as well as general preventive health recommendations were provided to patient.     Marta Antu, LPN   7/32/2025  Nurse Health Advisor  Nurse Notes: None

## 2020-07-24 NOTE — Patient Instructions (Signed)
Raymond Christensen , Thank you for taking time to come for your Medicare Wellness Visit. I appreciate your ongoing commitment to your health goals. Please review the following plan we discussed and let me know if I can assist you in the future.   Screening recommendations/referrals: Colonoscopy: No longer required Recommended yearly ophthalmology/optometry visit for glaucoma screening and checkup Recommended yearly dental visit for hygiene and checkup  Vaccinations: Influenza vaccine: Up to date Pneumococcal vaccine: Up to date Tdap vaccine: Up to date-Due 07/2030 Shingles vaccine: Completed vaccines   Covid-19: Up to date  Advanced directives: Copy in chart  Conditions/risks identified: See problem list  Next appointment: Follow up in one year for your annual wellness visit. 07/26/2021 @ 10:20  Preventive Care 79 Years and Older, Male Preventive care refers to lifestyle choices and visits with your health care provider that can promote health and wellness. What does preventive care include? A yearly physical exam. This is also called an annual well check. Dental exams once or twice a year. Routine eye exams. Ask your health care provider how often you should have your eyes checked. Personal lifestyle choices, including: Daily care of your teeth and gums. Regular physical activity. Eating a healthy diet. Avoiding tobacco and drug use. Limiting alcohol use. Practicing safe sex. Taking low doses of aspirin every day. Taking vitamin and mineral supplements as recommended by your health care provider. What happens during an annual well check? The services and screenings done by your health care provider during your annual well check will depend on your age, overall health, lifestyle risk factors, and family history of disease. Counseling  Your health care provider may ask you questions about your: Alcohol use. Tobacco use. Drug use. Emotional well-being. Home and relationship  well-being. Sexual activity. Eating habits. History of falls. Memory and ability to understand (cognition). Work and work Statistician. Screening  You may have the following tests or measurements: Height, weight, and BMI. Blood pressure. Lipid and cholesterol levels. These may be checked every 5 years, or more frequently if you are over 18 years old. Skin check. Lung cancer screening. You may have this screening every year starting at age 59 if you have a 30-pack-year history of smoking and currently smoke or have quit within the past 15 years. Fecal occult blood test (FOBT) of the stool. You may have this test every year starting at age 54. Flexible sigmoidoscopy or colonoscopy. You may have a sigmoidoscopy every 5 years or a colonoscopy every 10 years starting at age 28. Prostate cancer screening. Recommendations will vary depending on your family history and other risks. Hepatitis C blood test. Hepatitis B blood test. Sexually transmitted disease (STD) testing. Diabetes screening. This is done by checking your blood sugar (glucose) after you have not eaten for a while (fasting). You may have this done every 1-3 years. Abdominal aortic aneurysm (AAA) screening. You may need this if you are a current or former smoker. Osteoporosis. You may be screened starting at age 34 if you are at high risk. Talk with your health care provider about your test results, treatment options, and if necessary, the need for more tests. Vaccines  Your health care provider may recommend certain vaccines, such as: Influenza vaccine. This is recommended every year. Tetanus, diphtheria, and acellular pertussis (Tdap, Td) vaccine. You may need a Td booster every 10 years. Zoster vaccine. You may need this after age 70. Pneumococcal 13-valent conjugate (PCV13) vaccine. One dose is recommended after age 75. Pneumococcal polysaccharide (PPSV23) vaccine. One  dose is recommended after age 28. Talk to your health care  provider about which screenings and vaccines you need and how often you need them. This information is not intended to replace advice given to you by your health care provider. Make sure you discuss any questions you have with your health care provider. Document Released: 01/27/2015 Document Revised: 09/20/2015 Document Reviewed: 11/01/2014 Elsevier Interactive Patient Education  2017 [place] Prevention in the Home Falls can cause injuries. They can happen to people of all ages. There are many things you can do to make your home safe and to help prevent falls. What can I do on the outside of my home? Regularly fix the edges of walkways and driveways and fix any cracks. Remove anything that might make you trip as you walk through a door, such as a raised step or threshold. Trim any bushes or trees on the path to your home. Use bright outdoor lighting. Clear any walking paths of anything that might make someone trip, such as rocks or tools. Regularly check to see if handrails are loose or broken. Make sure that both sides of any steps have handrails. Any raised decks and porches should have guardrails on the edges. Have any leaves, snow, or ice cleared regularly. Use sand or salt on walking paths during winter. Clean up any spills in your garage right away. This includes oil or grease spills. What can I do in the bathroom? Use night lights. Install grab bars by the toilet and in the tub and shower. Do not use towel bars as grab bars. Use non-skid mats or decals in the tub or shower. If you need to sit down in the shower, use a plastic, non-slip stool. Keep the floor dry. Clean up any water that spills on the floor as soon as it happens. Remove soap buildup in the tub or shower regularly. Attach bath mats securely with double-sided non-slip rug tape. Do not have throw rugs and other things on the floor that can make you trip. What can I do in the bedroom? Use night lights. Make  sure that you have a light by your bed that is easy to reach. Do not use any sheets or blankets that are too big for your bed. They should not hang down onto the floor. Have a firm chair that has side arms. You can use this for support while you get dressed. Do not have throw rugs and other things on the floor that can make you trip. What can I do in the kitchen? Clean up any spills right away. Avoid walking on wet floors. Keep items that you use a lot in easy-to-reach places. If you need to reach something above you, use a strong step stool that has a grab bar. Keep electrical cords out of the way. Do not use floor polish or wax that makes floors slippery. If you must use wax, use non-skid floor wax. Do not have throw rugs and other things on the floor that can make you trip. What can I do with my stairs? Do not leave any items on the stairs. Make sure that there are handrails on both sides of the stairs and use them. Fix handrails that are broken or loose. Make sure that handrails are as long as the stairways. Check any carpeting to make sure that it is firmly attached to the stairs. Fix any carpet that is loose or worn. Avoid having throw rugs at the top or bottom of the  stairs. If you do have throw rugs, attach them to the floor with carpet tape. Make sure that you have a light switch at the top of the stairs and the bottom of the stairs. If you do not have them, ask someone to add them for you. What else can I do to help prevent falls? Wear shoes that: Do not have high heels. Have rubber bottoms. Are comfortable and fit you well. Are closed at the toe. Do not wear sandals. If you use a stepladder: Make sure that it is fully opened. Do not climb a closed stepladder. Make sure that both sides of the stepladder are locked into place. Ask someone to hold it for you, if possible. Clearly mark and make sure that you can see: Any grab bars or handrails. First and last steps. Where the  edge of each step is. Use tools that help you move around (mobility aids) if they are needed. These include: Canes. Walkers. Scooters. Crutches. Turn on the lights when you go into a dark area. Replace any light bulbs as soon as they burn out. Set up your furniture so you have a clear path. Avoid moving your furniture around. If any of your floors are uneven, fix them. If there are any pets around you, be aware of where they are. Review your medicines with your doctor. Some medicines can make you feel dizzy. This can increase your chance of falling. Ask your doctor what other things that you can do to help prevent falls. This information is not intended to replace advice given to you by your health care provider. Make sure you discuss any questions you have with your health care provider. Document Released: 10/27/2008 Document Revised: 06/08/2015 Document Reviewed: 02/04/2014 Elsevier Interactive Patient Education  2017 [employer].

## 2020-08-14 ENCOUNTER — Other Ambulatory Visit: Payer: Self-pay

## 2020-08-14 ENCOUNTER — Encounter: Payer: Self-pay | Admitting: Cardiology

## 2020-08-14 ENCOUNTER — Ambulatory Visit: Payer: Medicare (Managed Care) | Admitting: Cardiology

## 2020-08-14 VITALS — BP 112/70 | HR 62 | Ht 67.0 in | Wt 227.0 lb

## 2020-08-14 DIAGNOSIS — I4819 Other persistent atrial fibrillation: Secondary | ICD-10-CM

## 2020-08-14 MED ORDER — METOPROLOL SUCCINATE ER 50 MG PO TB24: 50.0000 mg | ORAL_TABLET | Freq: Every day | ORAL | 6 refills | Status: DC

## 2020-08-14 NOTE — Progress Notes (Signed)
Electrophysiology Office Note   Date:  08/14/2020   ID:  Raymond Christensen, DOB April 14, 1940, MRN [id number]  PCP:  [name], DO  Cardiologist:  Tamala Julian Primary Electrophysiologist:  Keegan Bensch Meredith [name], MD    Chief Complaint: AF   History of Present Illness: Raymond Christensen is a 80 y.o. male who is being seen today for the evaluation of AF at the request of [name]*. Presenting today for electrophysiology evaluation.  He has a history significant for atrial fibrillation, moderate systolic heart failure, mitral valve regurgitation status post mitral clip, coronary artery disease status post CABG.  1994, he had an MI complicated by cardiac arrest and received lytics.  He had CABG x4 in 2001 with postoperative atrial fibrillation.  He is status post left CEA.  He also has diabetes, hypertension, obesity, OSA on CPAP.  His mitral valve clip was 12/24/2019.  He is status post A. fib ablation 02/01/2020.  Today, denies symptoms of palpitations, chest pain, shortness of breath, orthopnea, PND, lower extremity edema, claudication, dizziness, presyncope, syncope, bleeding, or neurologic sequela. The patient is tolerating medications without difficulties.  He has had no further episodes of atrial fibrillation.  He is overall feeling well without major complaint.  Unfortunately, he does have some fatigue.  He is unclear as to why he has been having his fatigue.  He is curious as to whether or not it is due to medications.  He has also been having some bleeding.  He had a fall and had significant bleeding from his arm.   Past Medical History:  Diagnosis Date   Arthritis    CAD (coronary artery disease)    Carotid artery disease ([place])    s/p left CEA 123456   Chronic systolic CHF (congestive heart failure) ([place])    Concussion Summer 2012   Essential hypertension 02/26/2017   Heart disease    Hyperlipidemia    Hypothyroidism 10/15/2017   Major depressive disorder    Mitral  valve regurgitation    Myocardial infarction ([place]) 1996, 2021   Obstructive sleep apnea on CPAP    Persistent atrial fibrillation ([facility])    Poor flexibility of tendon 12/01/2018   S/P mitral valve clip implantation 12/23/2019   s/p TEER with MitraClip with one XTW by Dr. Burt Knack   Subungual hematoma of digit of hand 12/01/2018   Subungual hematoma of right foot 12/01/2018   Thyroid disease    Type 2 diabetes mellitus without complication, without long-term current use of insulin ([place]) 02/26/2017   Past Surgical History:  Procedure Laterality Date   ATRIAL FIBRILLATION ABLATION N/A 02/01/2020   Procedure: ATRIAL FIBRILLATION ABLATION;  Surgeon: [name], MD;  Location: [facility];  Service: Cardiovascular;  Laterality: N/A;   BYPASS GRAFT  2001   CARDIAC CATHETERIZATION     CARDIOVERSION N/A 11/08/2019   Procedure: CARDIOVERSION;  Surgeon: [name], MD;  Location: [facility];  Service: Cardiovascular;  Laterality: N/A;   CAROTID ENDARTERECTOMY Left 01/17/2014   CORONARY ANGIOPLASTY     CORONARY ARTERY BYPASS GRAFT     MITRAL VALVE REPAIR N/A 12/23/2019   Procedure: MITRAL VALVE REPAIR;  Surgeon: [name], MD;  Location: [facility];  Service: Cardiovascular;  Laterality: N/A;   RIGHT/LEFT HEART CATH AND CORONARY/GRAFT ANGIOGRAPHY N/A 09/09/2019   Procedure: RIGHT/LEFT HEART CATH AND CORONARY/GRAFT ANGIOGRAPHY;  Surgeon: [name], MD;  Location: [facility];  Service: Cardiovascular;  Laterality: N/A;   TEE WITHOUT CARDIOVERSION N/A  09/13/2019   Procedure: TRANSESOPHAGEAL ECHOCARDIOGRAM (TEE);  Surgeon: [name], MD;  Location: [facility];  Service: Cardiovascular;  Laterality: N/A;   TEE WITHOUT CARDIOVERSION N/A 12/23/2019   Procedure: TRANSESOPHAGEAL ECHOCARDIOGRAM (TEE);  Surgeon: [name], MD;  Location: [facility];  Service: Cardiovascular;  Laterality: N/A;   TEE WITHOUT CARDIOVERSION  02/01/2020    Procedure: TRANSESOPHAGEAL ECHOCARDIOGRAM (TEE);  Surgeon: [name], MD;  Location: [facility];  Service: Cardiovascular;;   TOTAL HIP ARTHROPLASTY  1994, 1995     Current Outpatient Medications  Medication Sig Dispense Refill   acetaminophen (TYLENOL) 500 MG tablet Take 1,000 mg by mouth every 6 (six) hours as needed for moderate pain or headache.     amoxicillin (AMOXIL) 500 MG tablet Take 4 tablets (2,000 mg total) one hour prior to all dental visits. 8 tablet 11   apixaban (ELIQUIS) 5 MG TABS tablet Take 1 tablet (5 mg total) by mouth 2 (two) times daily. 180 tablet 3   atorvastatin (LIPITOR) 80 MG tablet TAKE 1 TABLET DAILY 90 tablet 2   buPROPion (WELLBUTRIN XL) 150 MG 24 hr tablet TAKE ONE TABLET BY MOUTH DAILY 30 tablet 3   Cholecalciferol (VITAMIN D3) 250 MCG (10000 UT) capsule Take 10,000 Units by mouth daily.      clopidogrel (PLAVIX) 75 MG tablet TAKE 1 TABLET DAILY WITH   BREAKFAST 90 tablet 2   doxycycline (VIBRAMYCIN) 100 MG capsule Take 100 mg by mouth daily.     ezetimibe (ZETIA) 10 MG tablet TAKE 1 TABLET DAILY 90 tablet 3   famotidine (PEPCID) 20 MG tablet Take 1 tablet (20 mg total) by mouth daily. 30 tablet 3   finasteride (PROSCAR) 5 MG tablet TAKE 1 TABLET DAILY 90 tablet 1   furosemide (LASIX) 40 MG tablet Take 1 tablet (40 mg total) by mouth daily. 90 tablet 3   levocetirizine (XYZAL) 5 MG tablet TAKE 1 TABLET EVERY EVENING 90 tablet 1   levothyroxine (SYNTHROID) 75 MCG tablet TAKE 1 TABLET DAILY 90 tablet 1   liothyronine (CYTOMEL) 5 MCG tablet TAKE 2 TABLETS DAILY 180 tablet 3   metoprolol succinate (TOPROL-XL) 50 MG 24 hr tablet Take 1 tablet (50 mg total) by mouth daily. Take with or immediately following a meal. 30 tablet 6   Multiple Vitamins-Minerals (MULTIVITAMIN ADULT EXTRA C PO) Take 1 tablet by mouth daily.      nitroGLYCERIN (NITROSTAT) 0.4 MG SL tablet Place 1 tablet (0.4 mg total) under the tongue every 5 (five) minutes as needed for  chest pain. 30 tablet 12   omega-3 acid ethyl esters (LOVAZA) 1 g capsule TAKE 2 CAPSULES TWICE DAILY 360 capsule 2   potassium chloride (KLOR-CON) 10 MEQ tablet TAKE 2 TABLETS DAILY AS    NEEDED WITH EVERY '40MG'$  DOSEOF LASIX 180 tablet 2   sacubitril-valsartan (ENTRESTO) 24-26 MG Take 1 tablet by mouth 2 (two) times daily. 180 tablet 3   spironolactone (ALDACTONE) 25 MG tablet Take 0.5 tablets (12.5 mg total) by mouth daily. 135 tablet 3   tamsulosin (FLOMAX) 0.4 MG CAPS capsule TAKE 1 CAPSULE DAILY (Patient taking differently: Take 0.4 mg by mouth in the morning and at bedtime.) 90 capsule 3   vitamin B-12 (CYANOCOBALAMIN) 1000 MCG tablet Take 1,000 mcg by mouth daily.     zinc gluconate 50 MG tablet Take 50 mg by mouth daily.     No current facility-administered medications for this visit.    Allergies:   Sulfa antibiotics  Social History:  The patient  reports that he quit smoking about 42 years ago. His smoking use included cigarettes. He has never used smokeless tobacco. He reports current alcohol use of about 4.0 standard drinks of alcohol per week. He reports that he does not use drugs.   Family History:  The patient's family history includes Dementia in his father.   ROS:  Please see the history of present illness.   Otherwise, review of systems is positive for none.   All other systems are reviewed and negative.   PHYSICAL EXAM: VS:  BP 112/70   Pulse 62   Ht '5\' 7"'$  (1.702 m)   Wt 227 lb (103 kg)   BMI 35.55 kg/m  , BMI Body mass index is 35.55 kg/m. GEN: Well nourished, well developed, in no acute distress  HEENT: normal  Neck: no JVD, carotid bruits, or masses Cardiac: RRR; no murmurs, rubs, or gallops,no edema  Respiratory:  clear to auscultation bilaterally, normal work of breathing GI: soft, nontender, nondistended, + BS MS: no deformity or atrophy  Skin: warm and dry Neuro:  Strength and sensation are intact Psych: euthymic mood, full affect  EKG:  EKG is ordered  today. Personal review of the ekg ordered shows sinus rhythm, rate 62  Recent Labs: 09/11/2019: Magnesium 1.8 09/30/2019: TSH 2.990 12/21/2019: ALT 43; B Natriuretic Peptide 151.2 01/13/2020: Hemoglobin 13.8; Platelets 148 06/26/2020: BUN 20; Creatinine, Ser 1.23; NT-Pro BNP 137; Potassium 5.1; Sodium 141    Lipid Panel     Component Value Date/Time   CHOL 110 04/24/2020 1006   TRIG 190 (H) 04/24/2020 1006   HDL 42 04/24/2020 1006   CHOLHDL 2.6 04/24/2020 1006   CHOLHDL 3.2 09/08/2019 1254   VLDL 17 09/08/2019 1254   LDLCALC 37 04/24/2020 1006   LDLDIRECT 76.0 06/22/2019 1503     Wt Readings from Last 3 Encounters:  08/14/20 227 lb (103 kg)  07/24/20 238 lb 9.6 oz (108.2 kg)  06/14/20 236 lb 5.3 oz (107.2 kg)      Other studies Reviewed: Additional studies/ records that were reviewed today include: TEE 12/25/2019 Review of the above records today demonstrates:   1. Day 1 post Mitraclip placement, LVEF 40-45%, mitral regurgitation  mild, mean transmitral gradient 2 mmHg.   2. Left ventricular ejection fraction, by estimation, is 40 to 45%. The  left ventricle has mildly decreased function. The left ventricle  demonstrates global hypokinesis. Left ventricular diastolic function could  not be evaluated.   3. Right ventricular systolic function is mildly reduced. The right  ventricular size is normal.   4. Left atrial size was severely dilated.   5. Right atrial size was moderately dilated.   6. The mitral valve is normal in structure. Mild mitral valve  regurgitation. No evidence of mitral stenosis. The mean mitral valve  gradient is 2.0 mmHg with average heart rate of 61 bpm. There is a XTW  MitralClip present in the mitral position.  Procedure Date: 12/23/2019.   7. The aortic valve is normal in structure. Aortic valve regurgitation is  not visualized. No aortic stenosis is present.   8. The inferior vena cava is normal in size with greater than 50%  respiratory  variability, suggesting right atrial pressure of 3 mmHg.    RHC/LHC 09/10/19 Mid LM to Dist LM lesion is 70% stenosed. 1st Mrg lesion is 75% stenosed. 2nd Mrg-1 lesion is 90% stenosed. 2nd Mrg-2 lesion is 95% stenosed. Mid RCA to Dist RCA lesion is 100%  stenosed. Prox RCA to Mid RCA lesion is 90% stenosed. 2nd Diag lesion is 75% stenosed. Origin to Prox Graft lesion between 2nd Mrg and RPDA is 99% stenosed. Prox Graft to Mid Graft lesion between Ramus and 2nd Mrg is 75% stenosed. A drug-eluting stent was successfully placed using a STENT RESOLUTE ONYX 3.0X18. Post intervention, there is a 0% residual stenosis. A drug-eluting stent was successfully placed. Post intervention, there is a 0% residual stenosis. Hemodynamic findings consistent with mitral valve regurgitation.   ASSESSMENT AND PLAN:  1.  Persistent atrial fibrillation: Currently on Eliquis and Coreg.  CHA2DS2-VASc of 6.  Is status post ablation 02/01/2020.  Amiodarone has since been stopped.  He remains in sinus rhythm.  He has been having some bleeding on his Eliquis.  He is also on Plavix.  He may be a candidate for watchman.  He Emmerson Shuffield discuss this further with his primary cardiologist.  2.  Coronary artery disease status post CABG: No current chest pain.  3.  Hypertension: Currently well controlled.  4.  Chronic systolic heart failure due to ischemic cardiomyopathy: Currently on Coreg, Aldactone, Entresto.  No signs of volume overload.  He has been having some fatigue.  His fatigue could be related to his carvedilol.  We Cleston Lautner stop carvedilol and start 50 mg of Toprol-XL.  5.  Obstructive sleep apnea: CPAP compliance encouraged  6.  Severe mitral regurgitation: Status post mitral valve clip 12/23/2019.  Plan per primary cardiology.  Case discussed with primary cardiology  Current medicines are reviewed at length with the patient today.   The patient does not have concerns regarding his medicines.  The following changes  were made today: Stop carvedilol, start Toprol-XL  Labs/ tests ordered today include:  No orders of the defined types were placed in this encounter.    Disposition:   FU with Nygel Prokop 6 months  Signed, Trixie Maclaren Meredith [name], MD  08/14/2020 12:18 PM     [address] [place]  [address] [phone] (office) [phone] (fax)

## 2020-08-14 NOTE — Patient Instructions (Signed)
Medication Instructions:  Your physician has recommended you make the following change in your medication: STOP Carvedilol START Metoprolol Succinate (Toprol) 50 mg once daily  *If you need a refill on your cardiac medications before your next appointment, please call your pharmacy*   Lab Work: None ordered   Testing/Procedures: None ordered   Follow-Up: At [facility], you and your health needs are our priority.  As part of our continuing mission to provide you with exceptional heart care, we have created designated Provider Care Teams.  These Care Teams include your primary Cardiologist (physician) and Advanced Practice Providers (APPs -  Physician Assistants and Nurse Practitioners) who all work together to provide you with the care you need, when you need it.  Your next appointment:   6 month(s)  The format for your next appointment:   In Person  Provider:   Allegra Lai, MD    Thank you for choosing [facility]!!   Trinidad Curet, RN [phone]   Other Instructions

## 2020-08-16 ENCOUNTER — Telehealth: Payer: Self-pay | Admitting: *Deleted

## 2020-08-16 ENCOUNTER — Other Ambulatory Visit: Payer: Self-pay | Admitting: *Deleted

## 2020-08-16 NOTE — Telephone Encounter (Signed)
-----   Message from Belva Crome, MD sent at 08/16/2020 11:06 AM EDT ----- Regarding: Bleeding on Plavix/Eliquis. Let the patient know it is okay to stop Plavix. If still having bleeding on Eliquis, may need to discuss Watchman.  ----- Message ----- From: [name], MD Sent: 08/14/2020  12:15 PM EDT To: Belva Crome, MD  I saw [name] today.  He is feeling well.  Unfortunately he has been having a little bit of bleeding and falls.  As you know he is on both Eliquis and Plavix.  He would potentially be a candidate for watchman.  If you feel that he does need the Plavix, could you refer him either to Ronalee Belts or Lysbeth Galas for [employer].  Thanks.

## 2020-08-16 NOTE — Telephone Encounter (Signed)
Spoke with pt and made him aware of recommendations.  Pt agreeable to plan.

## 2020-08-18 NOTE — Addendum Note (Signed)
Addended by: Claude Manges on: 08/18/2020 12:50 PM   Modules accepted: Orders

## 2020-08-18 NOTE — Progress Notes (Signed)
Cardiology Office Note:    Date:  08/21/2020   ID:  Raymond Christensen, DOB 12/11/1940, MRN [id number]  PCP:  [name], DO  Cardiologist:  Sinclair Grooms, MD   Referring MD: [name]*   Chief Complaint  Patient presents with   Atrial Fibrillation   Coronary Artery Disease   Cardiac Valve Problem    MitraClip    History of Present Illness:    Raymond Christensen is a 80 y.o. male with a hx of new AFib, elevated troponin and new reduced Ef 35-40% 08/2019, and mod to severe MR, LA thrombus on TEE. Recent PCI with stent SVG to Cfx. Past history of MI in 1994 c/b cardiac arrest s/p lytics and PTCA, CABG in 2001 (LIMA to LAD, SVG to OM-2, SVG to right PDA, SVG to D-2) with post-op AF, carotid stenosis s/p L CEA and right subclavian stenosis, diet-controlled DM, HTN, obesity, OSA on CPAP, mild cognitive impairment, and depression/mood disorder. H/O MitraClip 12/2019 and AF ablation 01/2020. Recent nuisance bleeding on Eliquis/Plavix with Plavix DC 08/2020.   Frequently feels fatigued.  When he stops moving around and doing things, he will fall asleep.  He does have sleep apnea and is compliant.  Equipment is outdated and there is a replacement supply shortage.  This likely accounts for the patient's sleepiness.    His energy level has been good.  He denies dyspnea and angina.  He was having bleeding related to the combination of Plavix and Eliquis.  I have discontinued Plavix and bleeding seems to be somewhat better.  There was discussion of watchman if bleeding continues to be an issue on Eliquis alone.  He did not have any systemic bleeding.  This is only skin bruising.  [name] is not having angina.  He denies orthopnea and PND.  Carvedilol was switched to Toprol-XL recently by Dr. Curt Bears.  Blood pressure is a little lower today.  Has angina pectoris.  He is not using nitroglycerin.  He and his wife traveled to [place] for her grandsons graduation and had no  difficulty.  Past Medical History:  Diagnosis Date   Arthritis    CAD (coronary artery disease)    Carotid artery disease ([place])    s/p left CEA 123456   Chronic systolic CHF (congestive heart failure) ([place])    Concussion Summer 2012   Essential hypertension 02/26/2017   Heart disease    Hyperlipidemia    Hypothyroidism 10/15/2017   Major depressive disorder    Mitral valve regurgitation    Myocardial infarction ([place]) 1996, 2021   Obstructive sleep apnea on CPAP    Persistent atrial fibrillation (HCC)    Poor flexibility of tendon 12/01/2018   S/P mitral valve clip implantation 12/23/2019   s/p TEER with MitraClip with one XTW by Dr. Burt Knack   Subungual hematoma of digit of hand 12/01/2018   Subungual hematoma of right foot 12/01/2018   Thyroid disease    Type 2 diabetes mellitus without complication, without long-term current use of insulin ([place]) 02/26/2017    Past Surgical History:  Procedure Laterality Date   ATRIAL FIBRILLATION ABLATION N/A 02/01/2020   Procedure: ATRIAL FIBRILLATION ABLATION;  Surgeon: [name], MD;  Location: [facility];  Service: Cardiovascular;  Laterality: N/A;   BYPASS GRAFT  2001   CARDIAC CATHETERIZATION     CARDIOVERSION N/A 11/08/2019   Procedure: CARDIOVERSION;  Surgeon: [name], MD;  Location: [facility];  Service: Cardiovascular;  Laterality: N/A;  CAROTID ENDARTERECTOMY Left 01/17/2014   CORONARY ANGIOPLASTY     CORONARY ARTERY BYPASS GRAFT     MITRAL VALVE REPAIR N/A 12/23/2019   Procedure: MITRAL VALVE REPAIR;  Surgeon: [name], MD;  Location: [facility];  Service: Cardiovascular;  Laterality: N/A;   RIGHT/LEFT HEART CATH AND CORONARY/GRAFT ANGIOGRAPHY N/A 09/09/2019   Procedure: RIGHT/LEFT HEART CATH AND CORONARY/GRAFT ANGIOGRAPHY;  Surgeon: [name], MD;  Location: [facility];  Service: Cardiovascular;  Laterality: N/A;   TEE WITHOUT CARDIOVERSION N/A 09/13/2019   Procedure:  TRANSESOPHAGEAL ECHOCARDIOGRAM (TEE);  Surgeon: [name], MD;  Location: [facility];  Service: Cardiovascular;  Laterality: N/A;   TEE WITHOUT CARDIOVERSION N/A 12/23/2019   Procedure: TRANSESOPHAGEAL ECHOCARDIOGRAM (TEE);  Surgeon: [name], MD;  Location: [facility];  Service: Cardiovascular;  Laterality: N/A;   TEE WITHOUT CARDIOVERSION  02/01/2020   Procedure: TRANSESOPHAGEAL ECHOCARDIOGRAM (TEE);  Surgeon: [name], MD;  Location: [facility];  Service: Cardiovascular;;   TOTAL HIP ARTHROPLASTY  1994, 1995    Current Medications: Current Meds  Medication Sig   acetaminophen (TYLENOL) 500 MG tablet Take 1,000 mg by mouth every 6 (six) hours as needed for moderate pain or headache.   amoxicillin (AMOXIL) 500 MG tablet Take 4 tablets (2,000 mg total) one hour prior to all dental visits.   apixaban (ELIQUIS) 5 MG TABS tablet Take 1 tablet (5 mg total) by mouth 2 (two) times daily.   atorvastatin (LIPITOR) 80 MG tablet TAKE 1 TABLET DAILY   buPROPion (WELLBUTRIN XL) 150 MG 24 hr tablet TAKE ONE TABLET BY MOUTH DAILY   Cholecalciferol (VITAMIN D3) 250 MCG (10000 UT) capsule Take 10,000 Units by mouth daily.    doxycycline (VIBRAMYCIN) 100 MG capsule Take 100 mg by mouth daily.   ezetimibe (ZETIA) 10 MG tablet TAKE 1 TABLET DAILY   famotidine (PEPCID) 20 MG tablet Take 1 tablet (20 mg total) by mouth daily.   finasteride (PROSCAR) 5 MG tablet TAKE 1 TABLET DAILY   furosemide (LASIX) 40 MG tablet Take 1 tablet (40 mg total) by mouth daily.   levocetirizine (XYZAL) 5 MG tablet TAKE 1 TABLET EVERY EVENING   levothyroxine (SYNTHROID) 75 MCG tablet TAKE 1 TABLET DAILY   liothyronine (CYTOMEL) 5 MCG tablet TAKE 2 TABLETS DAILY   metoprolol succinate (TOPROL-XL) 50 MG 24 hr tablet Take 1 tablet (50 mg total) by mouth daily. Take with or immediately following a meal.   Multiple Vitamins-Minerals (MULTIVITAMIN ADULT EXTRA C PO) Take 1 tablet by mouth  daily.    nitroGLYCERIN (NITROSTAT) 0.4 MG SL tablet Place 1 tablet (0.4 mg total) under the tongue every 5 (five) minutes as needed for chest pain.   omega-3 acid ethyl esters (LOVAZA) 1 g capsule TAKE 2 CAPSULES TWICE DAILY   potassium chloride (KLOR-CON) 10 MEQ tablet TAKE 2 TABLETS DAILY AS    NEEDED WITH EVERY '40MG'$  DOSEOF LASIX   sacubitril-valsartan (ENTRESTO) 24-26 MG Take 1 tablet by mouth 2 (two) times daily.   spironolactone (ALDACTONE) 25 MG tablet Take 0.5 tablets (12.5 mg total) by mouth daily.   tamsulosin (FLOMAX) 0.4 MG CAPS capsule TAKE 1 CAPSULE DAILY (Patient taking differently: Take 0.4 mg by mouth in the morning and at bedtime.)   vitamin B-12 (CYANOCOBALAMIN) 1000 MCG tablet Take 1,000 mcg by mouth daily.   zinc gluconate 50 MG tablet Take 50 mg by mouth daily.     Allergies:   Sulfa antibiotics   Social  History   Socioeconomic History   Marital status: Married    Spouse name: Not on file   Number of children: 2   Years of education: 35   Highest education level: Doctorate  Occupational History   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  Tobacco Use   Smoking status: Former    Types: Cigarettes    Quit date: 1980    Years since quitting: 42.6   Smokeless tobacco: Never  Vaping Use   Vaping Use: Never used  Substance and Sexual Activity   Alcohol use: Yes    Alcohol/week: 4.0 standard drinks    Types: 1 Cans of beer, 3 Standard drinks or equivalent per week    Comment: 1 drink 3 times per week   Drug use: No   Sexual activity: Yes  Other Topics Concern   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 There is no history of Cancer.  ROS:   Please see the history of present illness.    He has had several falls associated with bleeding.  All other systems reviewed and are negative.  EKGs/Labs/Other Studies Reviewed:    The following studies were reviewed today: No new imaging data.  EKG:  EKG not repeated.  When he saw Dr. Curt Bears in April, EKG demonstrated sinus rhythm.  Recent Labs: 09/11/2019: Magnesium 1.8 09/30/2019: TSH 2.990 12/21/2019: ALT 43; B Natriuretic Peptide 151.2 01/13/2020: Hemoglobin 13.8; Platelets 148 06/26/2020: BUN 20; Creatinine, Ser 1.23; NT-Pro BNP 137; Potassium 5.1; Sodium 141  Recent Lipid Panel    Component Value Date/Time   CHOL 110 04/24/2020 1006   TRIG 190 (H) 04/24/2020 1006   HDL 42 04/24/2020 1006   CHOLHDL 2.6 04/24/2020 1006   CHOLHDL 3.2 09/08/2019 1254   VLDL 17 09/08/2019 1254   LDLCALC 37 04/24/2020 1006   LDLDIRECT 76.0 06/22/2019 1503    Physical Exam:    VS:  BP (!) 94/56   Pulse (!) 52   Ht '5\' 7"'$  (1.702 m)   Wt 240 lb 3.2 oz (109 kg)   SpO2 97%   BMI 37.62 kg/m     Wt Readings from Last 3 Encounters:  08/21/20 240 lb 3.2 oz (109 kg)  08/14/20 227 lb (103 kg)  07/24/20 238 lb 9.6 oz (108.2 kg)     GEN: Overweight. No acute distress HEENT: Normal NECK: No JVD. LYMPHATICS: No lymphadenopathy CARDIAC: No murmur. RRR no gallop, or edema. VASCULAR:  Normal Pulses. No bruits. RESPIRATORY:  Clear to auscultation without rales, wheezing or rhonchi  ABDOMEN: Soft, non-tender, non-distended, No pulsatile mass, MUSCULOSKELETAL: No deformity  SKIN: Warm and dry NEUROLOGIC:  Alert and oriented x 3 PSYCHIATRIC:  Normal affect   ASSESSMENT:    1. Persistent atrial fibrillation ([place])   2. Chronic systolic heart failure ([place])   3. Coronary artery disease involving coronary bypass graft of native heart with unstable angina pectoris ([place])    4. S/P mitral valve clip implantation  5. Secondary hypercoagulable state ([place])   6. Essential hypertension   7. Obstructive sleep apnea on CPAP   8. Type 2 diabetes mellitus without complication, without long-term current use of insulin (HCC)    PLAN:    In order of problems listed above:  Clinically stable today without increased heart rate on Toprol-XL 50 mg/day.  Relatively low blood pressure compared to prior. Continue heart failure therapy which includes beta-blocker, Arni, MRA, and we should consider SGLT2 therapy.  We will make decision based upon LV function with work-up that will be done in the second part of this year by the structural heart clinic. Now off of antiplatelet therapy.  He should notify us if angina. I do not hear mitral regurgitation on today's clinical exam Continue apixaban Blood pressure is relatively low.  May be able to tolerate only 25 mg of Toprol-XL.  Despite today's blood pressure he feels great and therefore I have not decreased the dose from 50 mg. Excessive daytime sleepiness likely related to sleep apnea and the need for new equipment is on backorder.   Medication Adjustments/Labs and Tests Ordered: Current medicines are reviewed at length with the patient today.  Concerns regarding medicines are outlined above.  No orders of the defined types were placed in this encounter.  No orders of the defined types were placed in this encounter.   Patient Instructions  Medication Instructions:  Your physician recommends that you continue on your current medications as directed. Please refer to the Current Medication list given to you today.  *If you need a refill on your cardiac medications before your next appointment, please call your pharmacy*   Lab Work: None If you have labs (blood work) drawn today and your tests are completely normal, you will receive your results only by: [place] (if you have MyChart) OR A paper copy in the mail If  you have any lab test that is abnormal or we need to change your treatment, we will call you to review the results.   Testing/Procedures: None   Follow-Up: At [facility], you and your health needs are our priority.  As part of our continuing mission to provide you with exceptional heart care, we have created designated Provider Care Teams.  These Care Teams include your primary Cardiologist (physician) and Advanced Practice Providers (APPs -  Physician Assistants and Nurse Practitioners) who all work together to provide you with the care you need, when you need it.  We recommend signing up for the patient portal called "MyChart".  Sign up information is provided on this After Visit Summary.  MyChart is used to connect with patients for Virtual Visits (Telemedicine).  Patients are able to view lab/test results, encounter notes, upcoming appointments, etc.  Non-urgent messages can be sent to your provider as well.   To learn more about what you can do with MyChart, go to [email or web address].    Your next appointment:   9 month(s)  The format for your next appointment:   In Person  Provider:   You may see Sinclair Grooms, MD or one of the following Advanced Practice Providers on your designated Care Team:   Cecilie Kicks, NP   Other Instructions     Signed, Sinclair Grooms, MD  08/21/2020 11:40 AM    [place]

## 2020-08-21 ENCOUNTER — Ambulatory Visit: Payer: Medicare (Managed Care) | Admitting: Interventional Cardiology

## 2020-08-21 ENCOUNTER — Other Ambulatory Visit: Payer: Self-pay

## 2020-08-21 ENCOUNTER — Encounter: Payer: Self-pay | Admitting: Interventional Cardiology

## 2020-08-21 VITALS — BP 94/56 | HR 52 | Ht 67.0 in | Wt 240.2 lb

## 2020-08-21 DIAGNOSIS — I10 Essential (primary) hypertension: Secondary | ICD-10-CM

## 2020-08-21 DIAGNOSIS — G4733 Obstructive sleep apnea (adult) (pediatric): Secondary | ICD-10-CM

## 2020-08-21 DIAGNOSIS — Z9889 Other specified postprocedural states: Secondary | ICD-10-CM | POA: Diagnosis not present

## 2020-08-21 DIAGNOSIS — D6869 Other thrombophilia: Secondary | ICD-10-CM

## 2020-08-21 DIAGNOSIS — I5022 Chronic systolic (congestive) heart failure: Secondary | ICD-10-CM | POA: Diagnosis not present

## 2020-08-21 DIAGNOSIS — I25700 Atherosclerosis of coronary artery bypass graft(s), unspecified, with unstable angina pectoris: Secondary | ICD-10-CM

## 2020-08-21 DIAGNOSIS — I4819 Other persistent atrial fibrillation: Secondary | ICD-10-CM

## 2020-08-21 DIAGNOSIS — Z9989 Dependence on other enabling machines and devices: Secondary | ICD-10-CM

## 2020-08-21 DIAGNOSIS — E119 Type 2 diabetes mellitus without complications: Secondary | ICD-10-CM

## 2020-08-21 DIAGNOSIS — Z95818 Presence of other cardiac implants and grafts: Secondary | ICD-10-CM

## 2020-08-21 NOTE — Patient Instructions (Signed)
Medication Instructions:  Your physician recommends that you continue on your current medications as directed. Please refer to the Current Medication list given to you today.  *If you need a refill on your cardiac medications before your next appointment, please call your pharmacy*   Lab Work: None If you have labs (blood work) drawn today and your tests are completely normal, you will receive your results only by: [place] (if you have MyChart) OR A paper copy in the mail If you have any lab test that is abnormal or we need to change your treatment, we will call you to review the results.   Testing/Procedures: None   Follow-Up: At [facility], you and your health needs are our priority.  As part of our continuing mission to provide you with exceptional heart care, we have created designated Provider Care Teams.  These Care Teams include your primary Cardiologist (physician) and Advanced Practice Providers (APPs -  Physician Assistants and Nurse Practitioners) who all work together to provide you with the care you need, when you need it.  We recommend signing up for the patient portal called "MyChart".  Sign up information is provided on this After Visit Summary.  MyChart is used to connect with patients for Virtual Visits (Telemedicine).  Patients are able to view lab/test results, encounter notes, upcoming appointments, etc.  Non-urgent messages can be sent to your provider as well.   To learn more about what you can do with MyChart, go to [email or web address].    Your next appointment:   9 month(s)  The format for your next appointment:   In Person  Provider:   You may see Sinclair Grooms, MD or one of the following Advanced Practice Providers on your designated Care Team:   Cecilie Kicks, NP   Other Instructions

## 2020-08-28 ENCOUNTER — Ambulatory Visit: Payer: Medicare (Managed Care) | Admitting: Family Medicine

## 2020-08-28 ENCOUNTER — Other Ambulatory Visit: Payer: Self-pay

## 2020-08-28 ENCOUNTER — Encounter: Payer: Self-pay | Admitting: Family Medicine

## 2020-08-28 ENCOUNTER — Other Ambulatory Visit: Payer: Self-pay | Admitting: Cardiology

## 2020-08-28 VITALS — BP 112/73 | HR 67 | Temp 97.5°F | Ht 68.0 in | Wt 240.4 lb

## 2020-08-28 DIAGNOSIS — F411 Generalized anxiety disorder: Secondary | ICD-10-CM

## 2020-08-28 DIAGNOSIS — F321 Major depressive disorder, single episode, moderate: Secondary | ICD-10-CM | POA: Diagnosis not present

## 2020-08-28 DIAGNOSIS — E119 Type 2 diabetes mellitus without complications: Secondary | ICD-10-CM | POA: Diagnosis not present

## 2020-08-28 DIAGNOSIS — I10 Essential (primary) hypertension: Secondary | ICD-10-CM | POA: Diagnosis not present

## 2020-08-28 DIAGNOSIS — R5383 Other fatigue: Secondary | ICD-10-CM

## 2020-08-28 LAB — MICROALBUMIN / CREATININE URINE RATIO
Creatinine,U: 101.6 mg/dL
Microalb Creat Ratio: 0.7 mg/g (ref 0.0–30.0)
Microalb, Ur: <0.7 mg/dL (ref 0.0–1.9)

## 2020-08-28 LAB — CBC
HCT: 39.4 % (ref 39.0–52.0)
Hemoglobin: 13 g/dL (ref 13.0–17.0)
MCHC: 32.9 g/dL (ref 30.0–36.0)
MCV: 89.6 fl (ref 78.0–100.0)
Platelets: 131 10*3/uL — ABNORMAL LOW (ref 150.0–400.0)
RBC: 4.4 Mil/uL (ref 4.22–5.81)
RDW: 13.8 % (ref 11.5–15.5)
WBC: 6.9 10*3/uL (ref 4.0–10.5)

## 2020-08-28 LAB — LIPID PANEL
Cholesterol: 113 mg/dL (ref 0–200)
HDL: 37 mg/dL — ABNORMAL LOW (ref >39.00)
LDL Cholesterol: 46 mg/dL (ref 0–99)
NonHDL: 76.06
Total CHOL/HDL Ratio: 3
Triglycerides: 150 mg/dL — ABNORMAL HIGH (ref 0.0–149.0)
VLDL: 30 mg/dL (ref 0.0–40.0)

## 2020-08-28 LAB — COMPREHENSIVE METABOLIC PANEL
ALT: 28 U/L (ref 0–53)
AST: 24 U/L (ref 0–37)
Albumin: 4.2 g/dL (ref 3.5–5.2)
Alkaline Phosphatase: 57 U/L (ref 39–117)
BUN: 26 mg/dL — ABNORMAL HIGH (ref 6–23)
CO2: 28 mEq/L (ref 19–32)
Calcium: 9.1 mg/dL (ref 8.4–10.5)
Chloride: 105 mEq/L (ref 96–112)
Creatinine, Ser: 1.23 mg/dL (ref 0.40–1.50)
GFR: 56.07 mL/min — ABNORMAL LOW (ref >60.00)
Glucose, Bld: 144 mg/dL — ABNORMAL HIGH (ref 70–99)
Potassium: 4.7 mEq/L (ref 3.5–5.1)
Sodium: 140 mEq/L (ref 135–145)
Total Bilirubin: 0.5 mg/dL (ref 0.2–1.2)
Total Protein: 6.3 g/dL (ref 6.0–8.3)

## 2020-08-28 LAB — VITAMIN D 25 HYDROXY (VIT D DEFICIENCY, FRACTURES): VITD: 71.69 ng/mL (ref 30.00–100.00)

## 2020-08-28 LAB — HEMOGLOBIN A1C: Hgb A1c MFr Bld: 6.4 % (ref 4.6–6.5)

## 2020-08-28 LAB — TSH: TSH: 4.38 u[IU]/mL (ref 0.35–5.50)

## 2020-08-28 MED ORDER — ARIPIPRAZOLE 2 MG PO TABS: 2.0000 mg | ORAL_TABLET | Freq: Every day | ORAL | 2 refills | Status: DC

## 2020-08-28 NOTE — Patient Instructions (Addendum)
If you do not hear anything about your referral in the next 1-2 weeks, call our office and ask for an update.  Keep moving.  Give [place] 2-3 business days to get the results of your labs back.   Let us know if you need anything.  [facility] [address] [place], [place] [address] [phone]  [facility] [address] [place], [place] [address] [phone]  [facility] [place], [place] [address] [phone]  [facility] [address], [place], [place], [phone] [address], [place], [place], [place]  [facility] [place], [place] and [place], [place], [place]  [facility] [place], [place]  Call one of these offices sooner than later as it can take 2-3 months to get a new patient appointment.

## 2020-08-28 NOTE — Progress Notes (Signed)
Subjective:   Chief Complaint  Patient presents with   Follow-up    Raymond Christensen is a 80 y.o. male here for follow-up of diabetes.   J[place] does not check his sugars routinely.  Patient does not require insulin.   Medications include: diet controlled Diet is healthy overall.  Exercise: cycling, wt resistance exercising  Hypothyroidism Patient presents for follow-up of hypothyroidism.  Reports compliance with medication- levothyroxine 75 mcg/d, Cytomel 10 mcg/d. Current symptoms include: fatigue Denies: denies weight changes, heat/cold intolerance, bowel/skin changes or CVS symptoms He believes his dose should be unchanged  Depression/anxiety Pt dealing with this over the past 2 years. He went to therapy for around 6 mo and found it minimally helpful. Failed Zoloft, Effexor, Wellbutrin, Remeron, Cymbalta, BuSpar, Lexapro. No SI or HI. No self medication. Never saw psychiatry.   Past Medical History:  Diagnosis Date   Arthritis    CAD (coronary artery disease)    Carotid artery disease ([place])    s/p left CEA 123456   Chronic systolic CHF (congestive heart failure) ([employer])    Concussion Summer 2012   Essential hypertension 02/26/2017   Heart disease    Hyperlipidemia    Hypothyroidism 10/15/2017   Major depressive disorder    Mitral valve regurgitation    Myocardial infarction [facility]) 1996, 2021   Obstructive sleep apnea on CPAP    Persistent atrial fibrillation (HCC)    Poor flexibility of tendon 12/01/2018   S/P mitral valve clip implantation 12/23/2019   s/p TEER with MitraClip with one XTW by Dr. Burt Knack   Subungual hematoma of digit of hand 12/01/2018   Subungual hematoma of right foot 12/01/2018   Thyroid disease    Type 2 diabetes mellitus without complication, without long-term current use of insulin ([place]) 02/26/2017     Related testing: Retinal exam: Done Pneumovax: done  Objective:  BP 112/73   Pulse 67   Temp (!) 97.5 F (36.4 C) (Oral)   Ht '5\' 8"'$  (1.727  m)   Wt 240 lb 6 oz (109 kg)   SpO2 99%   BMI 36.55 kg/m  General:  Well developed, well nourished, in no apparent distress Skin:  Warm, no pallor or diaphoresis Head:  Normocephalic, atraumatic Eyes:  Pupils equal and round, sclera anicteric without injection  Lungs:  CTAB, no access msc use Cardio:  RRR, no bruits, no LE edema Musculoskeletal:  Symmetrical muscle groups noted without atrophy or deformity Neuro:  Sensation intact to pinprick on feet Psych: Age appropriate judgment and insight  Assessment:   Type 2 diabetes mellitus without complication, without long-term current use of insulin (HCC) - Plan: CBC, Comprehensive metabolic panel, Lipid panel, Hemoglobin A1c, Microalbumin / creatinine urine ratio  Essential hypertension  Severe obesity (BMI 35.0-39.9) with comorbidity (HCC)  Depression, major, single episode, moderate ([place]) - Plan: Ambulatory referral to Psychiatry  GAD (generalized anxiety disorder) - Plan: Ambulatory referral to Psychiatry  Fatigue, unspecified type - Plan: VITAMIN D 25 Hydroxy (Vit-D Deficiency, Fractures), TSH   Plan:   Chronic, stable. Diet controlled. Ft exam today. Ck labs. Counseled on diet and exercise. Chronic, stable. Refer psychiatry, stop Wellbutrin, start Abilify.  As above.  Ck labs. Could be from 3/4.  F/u in 1 mo. The patient voiced understanding and agreement to the plan.  [place], DO 08/28/20 9:56 AM

## 2020-09-29 ENCOUNTER — Other Ambulatory Visit: Payer: Self-pay

## 2020-09-29 ENCOUNTER — Other Ambulatory Visit: Payer: Self-pay | Admitting: Interventional Cardiology

## 2020-09-29 ENCOUNTER — Ambulatory Visit: Payer: Medicare (Managed Care) | Admitting: Family Medicine

## 2020-09-29 ENCOUNTER — Other Ambulatory Visit: Payer: Self-pay | Admitting: Cardiology

## 2020-09-29 ENCOUNTER — Encounter: Payer: Self-pay | Admitting: Family Medicine

## 2020-09-29 VITALS — BP 124/68 | HR 67 | Temp 98.1°F | Ht 68.0 in | Wt 243.2 lb

## 2020-09-29 DIAGNOSIS — F321 Major depressive disorder, single episode, moderate: Secondary | ICD-10-CM | POA: Diagnosis not present

## 2020-09-29 DIAGNOSIS — F411 Generalized anxiety disorder: Secondary | ICD-10-CM

## 2020-09-29 DIAGNOSIS — M65352 Trigger finger, left little finger: Secondary | ICD-10-CM

## 2020-09-29 MED ORDER — NORTRIPTYLINE HCL 25 MG PO CAPS: 25.0000 mg | ORAL_CAPSULE | Freq: Every day | ORAL | 2 refills | Status: DC

## 2020-09-29 MED ORDER — APIXABAN 5 MG PO TABS: 5.0000 mg | ORAL_TABLET | Freq: Two times a day (BID) | ORAL | 3 refills | Status: DC

## 2020-09-29 NOTE — Patient Instructions (Addendum)
Let's stop the Abilify, we are starting a new medication, nortriptyline.  Failed Lexapro, Zoloft, Effexor, Cymbalta, Remeron, Wellbutrin, BuSpar, and now Abilify.   If your finger bothers you enough for a shot, let me know.  If the shaking bothers you enough to take a medication, let me know.   Let us know if you need anything.

## 2020-09-29 NOTE — Telephone Encounter (Signed)
Prescription refill request for Eliquis received. Indication:atrial fib Last office visit:8/22 Scr:1.2 Age: 80 Weight:110.3 kg  Prescription refilled

## 2020-09-29 NOTE — Progress Notes (Signed)
Chief Complaint  Patient presents with   Follow-up    Subjective Raymond Christensen presents for f/u anxiety/depression.  Pt is currently being treated with Abilify 2 mg/d.  Reports no change since treatment. No thoughts of harming self or others. No self-medication with alcohol, prescription drugs or illicit drugs. Pt is not following with a counselor/psychologist. Failed Lexapro, Zoloft, Effexor, Cymbalta, Remeron, Wellbutrin, BuSpar, and now Abilify.  Has appt w psychiatry next week.  L pinky finger Triggering and stiffness over past 3 mo. +pain over the palmar portion. No redness, bruising, weakness. It is slowly worsening. No neuro s/s's.   Past Medical History:  Diagnosis Date   Arthritis    CAD (coronary artery disease)    Carotid artery disease ([place])    s/p left CEA 123456   Chronic systolic CHF (congestive heart failure) ([place])    Concussion Summer 2012   Essential hypertension 02/26/2017   Heart disease    Hyperlipidemia    Hypothyroidism 10/15/2017   Major depressive disorder    Mitral valve regurgitation    Myocardial infarction ([place]) 1996, 2021   Obstructive sleep apnea on CPAP    Persistent atrial fibrillation (HCC)    Poor flexibility of tendon 12/01/2018   S/P mitral valve clip implantation 12/23/2019   s/p TEER with MitraClip with one XTW by Dr. Burt Knack   Subungual hematoma of digit of hand 12/01/2018   Subungual hematoma of right foot 12/01/2018   Thyroid disease    Type 2 diabetes mellitus without complication, without long-term current use of insulin ([place]) 02/26/2017   Allergies as of 09/29/2020       Reactions   Sulfa Antibiotics Rash   Questionable, he developed a diffuse rash 2 days after stopping Bactrim        Medication List        Accurate as of September 29, 2020 10:21 AM. If you have any questions, ask your nurse or doctor.          STOP taking these medications    ARIPiprazole 2 MG tablet Commonly known as: ABILIFY Stopped by:  [name], DO       TAKE these medications    acetaminophen 500 MG tablet Commonly known as: TYLENOL Take 1,000 mg by mouth every 6 (six) hours as needed for moderate pain or headache.   amoxicillin 500 MG tablet Commonly known as: AMOXIL Take 4 tablets (2,000 mg total) one hour prior to all dental visits.   apixaban 5 MG Tabs tablet Commonly known as: ELIQUIS Take 1 tablet (5 mg total) by mouth 2 (two) times daily.   atorvastatin 80 MG tablet Commonly known as: LIPITOR TAKE 1 TABLET DAILY   Entresto 24-26 MG Generic drug: sacubitril-valsartan TAKE 1 TABLET TWICE A DAY   ezetimibe 10 MG tablet Commonly known as: ZETIA TAKE 1 TABLET DAILY   famotidine 20 MG tablet Commonly known as: PEPCID Take 1 tablet (20 mg total) by mouth daily.   finasteride 5 MG tablet Commonly known as: PROSCAR TAKE 1 TABLET DAILY   furosemide 40 MG tablet Commonly known as: LASIX Take 1 tablet (40 mg total) by mouth daily.   levocetirizine 5 MG tablet Commonly known as: XYZAL TAKE 1 TABLET EVERY EVENING   levothyroxine 75 MCG tablet Commonly known as: SYNTHROID TAKE 1 TABLET DAILY   liothyronine 5 MCG tablet Commonly known as: CYTOMEL TAKE 2 TABLETS DAILY   metoprolol succinate 50 MG 24 hr tablet Commonly known as: TOPROL-XL Take 1 tablet (50 mg  total) by mouth daily. Take with or immediately following a meal.   MULTIVITAMIN ADULT EXTRA C PO Take 1 tablet by mouth daily.   nitroGLYCERIN 0.4 MG SL tablet Commonly known as: NITROSTAT Place 1 tablet (0.4 mg total) under the tongue every 5 (five) minutes as needed for chest pain.   nortriptyline 25 MG capsule Commonly known as: Pamelor Take 1 capsule (25 mg total) by mouth at bedtime. Started by: [name], DO   omega-3 acid ethyl esters 1 g capsule Commonly known as: LOVAZA TAKE 2 CAPSULES TWICE DAILY   potassium chloride 10 MEQ tablet Commonly known as: KLOR-CON TAKE 2 TABLETS DAILY AS     NEEDED WITH EVERY '40MG'$  DOSEOF LASIX   spironolactone 25 MG tablet Commonly known as: ALDACTONE Take 0.5 tablets (12.5 mg total) by mouth daily.   tamsulosin 0.4 MG Caps capsule Commonly known as: FLOMAX TAKE 1 CAPSULE DAILY What changed: when to take this   vitamin B-12 1000 MCG tablet Commonly known as: CYANOCOBALAMIN Take 1,000 mcg by mouth daily.   Vitamin D3 250 MCG (10000 UT) capsule Take 10,000 Units by mouth daily.   zinc gluconate 50 MG tablet Take 50 mg by mouth daily.        Exam BP 124/68   Pulse 67   Temp 98.1 F (36.7 C) (Oral)   Ht '5\' 8"'$  (1.727 m)   Wt 243 lb 4 oz (110.3 kg)   SpO2 98%   BMI 36.99 kg/m  General:  well developed, well nourished, in no apparent distress MSK: L hand- no gross deformity, erythema, excessive warmth, fluctuance; mild ttp over pulley of 4/5th flexor tendon. Small nodule noted. No triggering currently.  Lungs:  No respiratory distress Psych: well oriented with normal range of affect and age-appropriate judgement/insight, alert and oriented x4.  Assessment and Plan  Depression, major, single episode, moderate (HCC) - Plan: nortriptyline (PAMELOR) 25 MG capsule  GAD (generalized anxiety disorder) - Plan: nortriptyline (PAMELOR) 25 MG capsule  Trigger little finger of left hand  1/2. Chronic, unstable. Stop Abilify, trial Pamelor 25 mg/d. Has appt w psychiatry next week. Cont exercising.  3. Offered injection, politely declined at this time. He will let me know if things change.  F/u in 5 mo for DM visit or prn. The patient voiced understanding and agreement to the plan.  [place], DO 09/29/20 10:21 AM

## 2020-10-03 ENCOUNTER — Other Ambulatory Visit: Payer: Self-pay | Admitting: *Deleted

## 2020-10-03 MED ORDER — APIXABAN 5 MG PO TABS: 5.0000 mg | ORAL_TABLET | Freq: Two times a day (BID) | ORAL | 3 refills | Status: DC

## 2020-10-03 NOTE — Telephone Encounter (Signed)
Prescription refill request for Eliquis received. Indication:Afib  Last office visit: 08/21/20 Tamala Julian)  Scr:1.23 (08/28/20) Age: 80 Weight: 110.3kg  Appropriate dose and refill sent to requested pharmacy.

## 2020-10-06 ENCOUNTER — Emergency Department (HOSPITAL_BASED_OUTPATIENT_CLINIC_OR_DEPARTMENT_OTHER): Payer: Medicare (Managed Care)

## 2020-10-06 ENCOUNTER — Encounter (HOSPITAL_BASED_OUTPATIENT_CLINIC_OR_DEPARTMENT_OTHER): Payer: Self-pay | Admitting: *Deleted

## 2020-10-06 ENCOUNTER — Other Ambulatory Visit: Payer: Self-pay

## 2020-10-06 ENCOUNTER — Emergency Department (HOSPITAL_BASED_OUTPATIENT_CLINIC_OR_DEPARTMENT_OTHER)
Admission: EM | Admit: 2020-10-06 | Discharge: 2020-10-06 | Disposition: A | Discharge status: Home / Self Care | Payer: Medicare (Managed Care) | Attending: Emergency Medicine | Admitting: Emergency Medicine

## 2020-10-06 ENCOUNTER — Other Ambulatory Visit (HOSPITAL_BASED_OUTPATIENT_CLINIC_OR_DEPARTMENT_OTHER): Payer: Self-pay

## 2020-10-06 DIAGNOSIS — I110 Hypertensive heart disease with heart failure: Secondary | ICD-10-CM | POA: Diagnosis not present

## 2020-10-06 DIAGNOSIS — I5022 Chronic systolic (congestive) heart failure: Secondary | ICD-10-CM | POA: Diagnosis not present

## 2020-10-06 DIAGNOSIS — I2510 Atherosclerotic heart disease of native coronary artery without angina pectoris: Secondary | ICD-10-CM | POA: Insufficient documentation

## 2020-10-06 DIAGNOSIS — Z87891 Personal history of nicotine dependence: Secondary | ICD-10-CM | POA: Diagnosis not present

## 2020-10-06 DIAGNOSIS — Z7901 Long term (current) use of anticoagulants: Secondary | ICD-10-CM | POA: Insufficient documentation

## 2020-10-06 DIAGNOSIS — I4819 Other persistent atrial fibrillation: Secondary | ICD-10-CM | POA: Insufficient documentation

## 2020-10-06 DIAGNOSIS — Z96649 Presence of unspecified artificial hip joint: Secondary | ICD-10-CM | POA: Insufficient documentation

## 2020-10-06 DIAGNOSIS — M546 Pain in thoracic spine: Secondary | ICD-10-CM | POA: Diagnosis not present

## 2020-10-06 DIAGNOSIS — Z951 Presence of aortocoronary bypass graft: Secondary | ICD-10-CM | POA: Diagnosis not present

## 2020-10-06 DIAGNOSIS — E119 Type 2 diabetes mellitus without complications: Secondary | ICD-10-CM | POA: Diagnosis not present

## 2020-10-06 DIAGNOSIS — Z79899 Other long term (current) drug therapy: Secondary | ICD-10-CM | POA: Insufficient documentation

## 2020-10-06 DIAGNOSIS — E039 Hypothyroidism, unspecified: Secondary | ICD-10-CM | POA: Insufficient documentation

## 2020-10-06 DIAGNOSIS — M25512 Pain in left shoulder: Secondary | ICD-10-CM | POA: Insufficient documentation

## 2020-10-06 LAB — CBC WITH DIFFERENTIAL/PLATELET
Abs Immature Granulocytes: 0.02 10*3/uL (ref 0.00–0.07)
Basophils Absolute: 0 10*3/uL (ref 0.0–0.1)
Basophils Relative: 0 %
Eosinophils Absolute: 0 10*3/uL (ref 0.0–0.5)
Eosinophils Relative: 0 %
HCT: 43.3 % (ref 39.0–52.0)
Hemoglobin: 14.3 g/dL (ref 13.0–17.0)
Immature Granulocytes: 0 %
Lymphocytes Relative: 13 %
Lymphs Abs: 1.4 10*3/uL (ref 0.7–4.0)
MCH: 29.4 pg (ref 26.0–34.0)
MCHC: 33 g/dL (ref 30.0–36.0)
MCV: 89.1 fL (ref 80.0–100.0)
Monocytes Absolute: 0.6 10*3/uL (ref 0.1–1.0)
Monocytes Relative: 6 %
Neutro Abs: 9.2 10*3/uL — ABNORMAL HIGH (ref 1.7–7.7)
Neutrophils Relative %: 81 %
Platelets: 143 10*3/uL — ABNORMAL LOW (ref 150–400)
RBC: 4.86 MIL/uL (ref 4.22–5.81)
RDW: 13.4 % (ref 11.5–15.5)
WBC: 11.3 10*3/uL — ABNORMAL HIGH (ref 4.0–10.5)
nRBC: 0 % (ref 0.0–0.2)

## 2020-10-06 LAB — COMPREHENSIVE METABOLIC PANEL
ALT: 38 U/L (ref 0–44)
AST: 31 U/L (ref 15–41)
Albumin: 4.5 g/dL (ref 3.5–5.0)
Alkaline Phosphatase: 62 U/L (ref 38–126)
Anion gap: 7 (ref 5–15)
BUN: 19 mg/dL (ref 8–23)
CO2: 27 mmol/L (ref 22–32)
Calcium: 9.4 mg/dL (ref 8.9–10.3)
Chloride: 102 mmol/L (ref 98–111)
Creatinine, Ser: 1.13 mg/dL (ref 0.61–1.24)
GFR, Estimated: >60 mL/min (ref >60)
Glucose, Bld: 133 mg/dL — ABNORMAL HIGH (ref 70–99)
Potassium: 4.5 mmol/L (ref 3.5–5.1)
Sodium: 136 mmol/L (ref 135–145)
Total Bilirubin: 0.5 mg/dL (ref 0.3–1.2)
Total Protein: 7.4 g/dL (ref 6.5–8.1)

## 2020-10-06 LAB — TROPONIN I (HIGH SENSITIVITY): Troponin I (High Sensitivity): 4 ng/L (ref <18)

## 2020-10-06 LAB — LIPASE, BLOOD: Lipase: 24 U/L (ref 11–51)

## 2020-10-06 MED ORDER — ACETAMINOPHEN 500 MG PO TABS
1000.0000 mg | ORAL_TABLET | Freq: Once | ORAL | Status: AC
  Administered 2020-10-06: 1000 mg via ORAL
  Filled 2020-10-06: qty 2

## 2020-10-06 MED ORDER — HYDROCODONE-ACETAMINOPHEN 5-325 MG PO TABS
1.0000 | ORAL_TABLET | Freq: Four times a day (QID) | ORAL | 0 refills | Status: DC | PRN
  Filled 2020-10-06: qty 20, 5d supply, fill #0

## 2020-10-06 MED ORDER — METHOCARBAMOL 500 MG PO TABS
500.0000 mg | ORAL_TABLET | Freq: Once | ORAL | Status: AC
  Administered 2020-10-06: 500 mg via ORAL
  Filled 2020-10-06: qty 1

## 2020-10-06 NOTE — Discharge Instructions (Signed)
1.  At this time your pain appears most likely to be musculoskeletal chest wall pain.  You may take 2 extra strength Tylenol every 6 hours for pain.  If this is not adequate, take 1 extra strength Tylenol and 1 Vicodin tablet instead of 2 extra strength Tylenol tablets.  You may also buy over-the-counter Lidoderm patches and also try warm moist heat compresses. 2.  Return to the emergency department immediately if you develop shortness of breath, fever, cough or other concerning symptoms.  Also watch for any sign of a rash with small blisters.  Shingles sometimes starts with pain and no rash.  Shingles should be treated as soon as possible.

## 2020-10-06 NOTE — ED Provider Notes (Signed)
[facility] EMERGENCY DEPARTMENT Provider Note   CSN: [id number] Arrival date & time: 10/06/20  2376     History Chief Complaint  Patient presents with   Shoulder Pain    Raymond Christensen is a 80 y.o. male.  HPI Patient reports he started developing pain behind his left scapula about 3 or 4 days ago he has not done anything that he knows of to create injury.  He reports is just a aching sometimes slightly sharp pain.  Is been constant.  Slightly worse sometimes with certain position changes but not specifically so, no fever, no cough, no shortness of breath.  No associated chest pain.  No radiation.  Patient is chronically anticoagulated on Eliquis.    Past Medical History:  Diagnosis Date   Arthritis    CAD (coronary artery disease)    Carotid artery disease ([place])    s/p left CEA 02/21/29   Chronic systolic CHF (congestive heart failure) ([place])    Concussion Summer 2012   Essential hypertension 02/26/2017   Heart disease    Hyperlipidemia    Hypothyroidism 10/15/2017   Major depressive disorder    Mitral valve regurgitation    Myocardial infarction ([place]) 1996, 2021   Obstructive sleep apnea on CPAP    Persistent atrial fibrillation (HCC)    Poor flexibility of tendon 12/01/2018   S/P mitral valve clip implantation 12/23/2019   s/p TEER with MitraClip with one XTW by Dr. Burt Knack   Subungual hematoma of digit of hand 12/01/2018   Subungual hematoma of right foot 12/01/2018   Thyroid disease    Type 2 diabetes mellitus without complication, without long-term current use of insulin ([place]) 02/26/2017    Patient Active Problem List   Diagnosis Date Noted   Trigger little finger of left hand 09/29/2020   Secondary hypercoagulable state ([place]) 02/29/2020   Depression, major, single episode, moderate ([place]) 02/28/2020   Nonrheumatic tricuspid valve regurgitation    S/P mitral valve clip implantation 12/23/2019   Mitral valve disease 12/23/2019   Nonrheumatic mitral valve  regurgitation    Persistent atrial fibrillation (HCC)    Chronic systolic CHF (congestive heart failure) ([place])    Acute idiopathic gout involving toe of left foot    Thrombocytopenia ([place]) 09/08/2019   CAD (coronary artery disease) of artery bypass graft 09/08/2019   Diabetes mellitus type 2 in obese ([place]) 06/22/2019   Major depressive disorder    Obstructive sleep apnea on CPAP    Hypothyroidism 10/15/2017   Subclavian artery stenosis, right ([place]) 06/15/2017   Type 2 diabetes mellitus without complication, without long-term current use of insulin ([place]) 02/26/2017   Severe obesity (BMI 35.0-39.9) with comorbidity ([place]) 02/26/2017   Essential hypertension 02/26/2017   Memory change 01/10/2017   Urge incontinence of urine 01/10/2017   Lightheaded 06/25/2016   AK (actinic keratosis) 01/03/2016   H/O syncope 03/06/2014   Occlusion and stenosis of left carotid artery 01/11/2014   Routine history and physical examination of adult 11/08/2013   History of repair of hip joint 04/20/2013   Hyperlipidemia 04/20/2013   Presence of artificial hip joint 04/20/2013   Vitamin D deficiency 06/18/2010   Concussion 01/24/2010   Elevated prostate specific antigen (PSA) 01/01/2007   Benign prostatic hyperplasia without urinary obstruction 04/22/2006   Osteoarthrosis 04/22/2006   Dyslipidemia 04/02/2006    Past Surgical History:  Procedure Laterality Date   ATRIAL FIBRILLATION ABLATION N/A 02/01/2020   Procedure: ATRIAL FIBRILLATION ABLATION;  Surgeon: [name], MD;  Location: [facility]  INVASIVE CV LAB;  Service: Cardiovascular;  Laterality: N/A;   BYPASS GRAFT  2001   CARDIAC CATHETERIZATION     CARDIOVERSION N/A 11/08/2019   Procedure: CARDIOVERSION;  Surgeon: [name], MD;  Location: [facility];  Service: Cardiovascular;  Laterality: N/A;   CAROTID ENDARTERECTOMY Left 01/17/2014   CORONARY ANGIOPLASTY     CORONARY ARTERY BYPASS GRAFT     MITRAL VALVE REPAIR N/A 12/23/2019    Procedure: MITRAL VALVE REPAIR;  Surgeon: [name], MD;  Location: [facility] CV LAB;  Service: Cardiovascular;  Laterality: N/A;   RIGHT/LEFT HEART CATH AND CORONARY/GRAFT ANGIOGRAPHY N/A 09/09/2019   Procedure: RIGHT/LEFT HEART CATH AND CORONARY/GRAFT ANGIOGRAPHY;  Surgeon: [name], MD;  Location: [facility] CV LAB;  Service: Cardiovascular;  Laterality: N/A;   TEE WITHOUT CARDIOVERSION N/A 09/13/2019   Procedure: TRANSESOPHAGEAL ECHOCARDIOGRAM (TEE);  Surgeon: [name], MD;  Location: [facility] ENDOSCOPY;  Service: Cardiovascular;  Laterality: N/A;   TEE WITHOUT CARDIOVERSION N/A 12/23/2019   Procedure: TRANSESOPHAGEAL ECHOCARDIOGRAM (TEE);  Surgeon: [name], MD;  Location: [facility] CV LAB;  Service: Cardiovascular;  Laterality: N/A;   TEE WITHOUT CARDIOVERSION  02/01/2020   Procedure: TRANSESOPHAGEAL ECHOCARDIOGRAM (TEE);  Surgeon: [name], MD;  Location: [facility] CV LAB;  Service: Cardiovascular;;   TOTAL HIP ARTHROPLASTY  1994, 1995       Family History  Problem Relation Age of Onset   Dementia Father        Concerns surrounding Lewy body dementia, but never officially diagnosed   Cancer Neg Hx     Social History   Tobacco Use   Smoking status: Former    Types: Cigarettes    Quit date: 1980    Years since quitting: 42.7   Smokeless tobacco: Never  Vaping Use   Vaping Use: Never used  Substance Use Topics   Alcohol use: Yes    Alcohol/week: 4.0 standard drinks    Types: 1 Cans of beer, 3 Standard drinks or equivalent per week    Comment: 1 drink 3 times per week   Drug use: No    Home Medications Prior to Admission medications   Medication Sig Start Date End Date Taking? Authorizing Provider  HYDROcodone-acetaminophen (NORCO/VICODIN) 5-325 MG tablet Take 1 tablet by mouth every 6 (six) hours as needed for moderate pain or severe pain. 10/06/20  Yes Charlesetta Shanks, MD  acetaminophen (TYLENOL) 500 MG tablet Take 1,000 mg  by mouth every 6 (six) hours as needed for moderate pain or headache.    [provider]  amoxicillin (AMOXIL) 500 MG tablet Take 4 tablets (2,000 mg total) one hour prior to all dental visits. 12/30/19   Eileen Stanford, PA-C  apixaban (ELIQUIS) 5 MG TABS tablet Take 1 tablet (5 mg total) by mouth 2 (two) times daily. 10/03/20   Belva Crome, MD  atorvastatin (LIPITOR) 80 MG tablet TAKE 1 TABLET DAILY 05/23/20   Belva Crome, MD  Cholecalciferol (VITAMIN D3) 250 MCG (10000 UT) capsule Take 10,000 Units by mouth daily.     [provider]  ENTRESTO 24-26 MG TAKE 1 TABLET TWICE A DAY 08/29/20   Isaiah Serge, NP  ezetimibe (ZETIA) 10 MG tablet TAKE 1 TABLET DAILY 05/08/20   Belva Crome, MD  famotidine (PEPCID) 20 MG tablet Take 1 tablet (20 mg total) by mouth daily. 09/07/19   Saguier, Percell Miller, PA-C  finasteride (PROSCAR) 5 MG tablet TAKE 1 TABLET DAILY 02/21/20   [name],  DO  furosemide (LASIX) 40 MG tablet Take 1 tablet (40 mg total) by mouth daily. 09/30/19   Isaiah Serge, NP  levocetirizine (XYZAL) 5 MG tablet TAKE 1 TABLET EVERY EVENING 07/11/20   [name], DO  levothyroxine (SYNTHROID) 75 MCG tablet TAKE 1 TABLET DAILY 06/09/20   [name], DO  liothyronine (CYTOMEL) 5 MCG tablet TAKE 2 TABLETS DAILY 10/13/19   Nani Ravens, Crosby Oyster, DO  metoprolol succinate (TOPROL-XL) 50 MG 24 hr tablet Take 1 tablet (50 mg total) by mouth daily. Take with or immediately following a meal. 08/14/20   Camnitz, Ocie Doyne, MD  Multiple Vitamins-Minerals (MULTIVITAMIN ADULT EXTRA C PO) Take 1 tablet by mouth daily.     [provider]  nitroGLYCERIN (NITROSTAT) 0.4 MG SL tablet Place 1 tablet (0.4 mg total) under the tongue every 5 (five) minutes as needed for chest pain. 09/14/19   Charlynne Cousins, MD  nortriptyline (PAMELOR) 25 MG capsule Take 1 capsule (25 mg total) by mouth at bedtime. 09/29/20   [name], DO   omega-3 acid ethyl esters (LOVAZA) 1 g capsule TAKE 2 CAPSULES TWICE DAILY 12/17/19   Wendling, Crosby Oyster, DO  potassium chloride (KLOR-CON) 10 MEQ tablet TAKE 2 TABLETS DAILY AS    NEEDED WITH EVERY 40MG  DOSEOF LASIX 06/26/20   Belva Crome, MD  spironolactone (ALDACTONE) 25 MG tablet TAKE 1/2 TABLET DAILY 09/29/20   Belva Crome, MD  tamsulosin (FLOMAX) 0.4 MG CAPS capsule TAKE 1 CAPSULE DAILY Patient taking differently: Take 0.4 mg by mouth in the morning and at bedtime. 09/06/19   [name], DO  vitamin B-12 (CYANOCOBALAMIN) 1000 MCG tablet Take 1,000 mcg by mouth daily.    [provider]  zinc gluconate 50 MG tablet Take 50 mg by mouth daily.    [provider]    Allergies    Sulfa antibiotics  Review of Systems   Review of Systems 10 systems reviewed and negative except as per HPI Physical Exam Updated Vital Signs BP (!) 151/71 (BP Location: Right Arm)   Pulse 64   Temp 97.6 F (36.4 C) (Oral)   Resp 16   Ht 5\' 8"  (1.727 m)   Wt 108.4 kg   SpO2 97%   BMI 36.34 kg/m   Physical Exam Constitutional:      Comments: Alert nontoxic no respiratory distress  HENT:     Head: Normocephalic and atraumatic.     Mouth/Throat:     Pharynx: Oropharynx is clear.  Cardiovascular:     Rate and Rhythm: Normal rate and regular rhythm.  Pulmonary:     Effort: Pulmonary effort is normal.     Breath sounds: Normal breath sounds.     Comments: With examination no rash.  Patient's focus of pain is just medial to the top of the scapula between spine and scapula.  Slightly reproducible.  No soft tissue abnormalities. Abdominal:     General: There is no distension.     Palpations: Abdomen is soft.     Tenderness: There is no abdominal tenderness. There is no guarding.  Musculoskeletal:        General: No tenderness. Normal range of motion.  Skin:    General: Skin is warm.  Neurological:     General: No focal deficit present.     Mental Status: He  is oriented to person, place, and time.     Coordination: Coordination normal.    ED Results / Procedures /  Treatments   Labs (all labs ordered are listed, but only abnormal results are displayed) Labs Reviewed  COMPREHENSIVE METABOLIC PANEL - Abnormal; Notable for the following components:      Result Value   Glucose, Bld 133 (*)    All other components within normal limits  CBC WITH DIFFERENTIAL/PLATELET - Abnormal; Notable for the following components:   WBC 11.3 (*)    Platelets 143 (*)    Neutro Abs 9.2 (*)    All other components within normal limits  LIPASE, BLOOD  TROPONIN I (HIGH SENSITIVITY)    EKG EKG Interpretation  Date/Time:  Friday October 06 2020 10:29:49 EDT Ventricular Rate:  73 PR Interval:  214 QRS Duration: 110 QT Interval:  411 QTC Calculation: 453 R Axis:   68 Text Interpretation: Sinus rhythm Borderline prolonged PR interval Borderline low voltage, extremity leads no sig change from previous Confirmed by Charlesetta Shanks [phone]) on 10/06/2020 2:08:59 PM  Radiology DG Chest 2 View  Result Date: 10/06/2020 CLINICAL DATA:  Left subscapular pain for 3-4 days, no known injury EXAM: CHEST - 2 VIEW COMPARISON:  12/20/2000 FINDINGS: Cardiomegaly status post median sternotomy with fractured sternotomy wires. Both lungs are clear. Disc degenerative disease of the thoracic spine. IMPRESSION: Cardiomegaly without acute abnormality of the lungs. Electronically Signed   By: Eddie Candle M.D.   On: 10/06/2020 12:54    Procedures Procedures   Medications Ordered in ED Medications  acetaminophen (TYLENOL) tablet 1,000 mg (1,000 mg Oral Given 10/06/20 1143)  methocarbamol (ROBAXIN) tablet 500 mg (500 mg Oral Given 10/06/20 1143)    ED Course  I have reviewed the triage vital signs and the nursing notes.  Pertinent labs & imaging results that were available during my care of the patient were reviewed by me and considered in my medical decision making (see chart  for details).    MDM Rules/Calculators/A&P                           Patient presents with a focus of pain just about slightly medial to the edge of the scapula on the left.  No associated symptoms.  Patient is chronically anticoagulated on Eliquis.  No shortness of breath.  Low suspicion for PE.  Troponin is norma, pain has been constant for several days and EKG does not show any ischemic changes.  Much lower suspicion for cardiac ischemic etiology.  At this time will treat as musculoskeletal pain.  Patient is also counseled to watch for rash for possible early zoster careful return precautions reviewed. Final Clinical Impression(s) / ED Diagnoses Final diagnoses:  Acute left-sided thoracic back pain    Rx / DC Orders ED Discharge Orders          Ordered    HYDROcodone-acetaminophen (NORCO/VICODIN) 5-325 MG tablet  Every 6 hours PRN        10/06/20 1525             Charlesetta Shanks, MD 10/06/20 1532

## 2020-10-06 NOTE — ED Triage Notes (Signed)
Left sub scapula pain x 3-4 days  denies inj

## 2020-10-10 ENCOUNTER — Other Ambulatory Visit: Payer: Self-pay

## 2020-10-10 ENCOUNTER — Ambulatory Visit: Payer: Medicare (Managed Care) | Attending: Internal Medicine

## 2020-10-10 ENCOUNTER — Ambulatory Visit: Payer: Medicare (Managed Care) | Admitting: Family Medicine

## 2020-10-10 ENCOUNTER — Encounter: Payer: Self-pay | Admitting: Family Medicine

## 2020-10-10 VITALS — BP 128/74 | HR 67 | Temp 98.2°F | Ht 68.0 in | Wt 240.4 lb

## 2020-10-10 DIAGNOSIS — Z23 Encounter for immunization: Secondary | ICD-10-CM

## 2020-10-10 DIAGNOSIS — M546 Pain in thoracic spine: Secondary | ICD-10-CM | POA: Diagnosis not present

## 2020-10-10 MED ORDER — TIZANIDINE HCL 4 MG PO TABS: 4.0000 mg | ORAL_TABLET | Freq: Four times a day (QID) | ORAL | 0 refills | Status: DC | PRN

## 2020-10-10 NOTE — Patient Instructions (Signed)
Heat (pad or rice pillow in microwave) over affected area, 10-15 minutes twice daily.   Ice/cold pack over area for 10-15 min twice daily.  OK to take Tylenol 1000 mg (2 extra strength tabs) or 975 mg (3 regular strength tabs) every 6 hours as needed.  Let us know if you need anything.   Mid-Back Strain Rehab It is normal to feel mild stretching, pulling, tightness, or discomfort as you do these exercises, but you should stop right away if you feel sudden pain or your pain gets worse.   Stretching and range of motion exercises This exercise warms up your muscles and joints and improves the movement and flexibility of your back and shoulders. This exercise also help to relieve pain. Exercise A: Chest and spine stretch    Lie down on your back on a firm surface. Roll a towel or a small blanket so it is about 4 inches (10 cm) in diameter. Put the towel lengthwise under the middle of your back so it is under your spine, but not under your shoulder blades. To increase the stretch, you may put your hands behind your head and let your elbows fall to your sides. Hold for 30 seconds. Repeat exercise 2 times. Complete this exercise 3 times per week.  Strengthening exercises These exercises build strength and endurance in your back and your shoulder blade muscles. Endurance is the ability to use your muscles for a long time, even after they get tired. Exercise B: Alternating arm and leg raises    Get on your hands and knees on a firm surface. If you are on a hard floor, you may want to use padding to cushion your knees, such as an exercise mat. Line up your arms and legs. Your hands should be below your shoulders, and your knees should be below your hips. Lift your left leg behind you. At the same time, raise your right arm and straighten it in front of you. Do not lift your leg higher than your hip. Do not lift your arm higher than your shoulder. Keep your abdominal and back muscles  tight. Keep your hips facing the ground. Do not arch your back. Keep your balance carefully, and do not hold your breath. Hold for 3 seconds. Slowly return to the starting position and repeat with your right leg and your left arm. Repeat 2 times. Complete this exercise 3 times per week. Exercise C: Straight arm rows (shoulder extension)     Stand with your feet shoulder width apart. Secure an exercise band to a stable object in front of you so the band is at or above shoulder height. Hold one end of the exercise band in each hand. Straighten your elbows and lift your hands up to shoulder height. Step back, away from the secured end of the exercise band, until the band stretches. Squeeze your shoulder blades together and pull your hands down to the sides of your thighs. Stop when your hands are straight down by your sides. Do not let your hands go behind your body. Hold for 3 seconds. Slowly return to the starting position. Repeat 2 times. Complete this exercise 3 times per week. Exercise D: Shoulder external rotation, prone Lie on your abdomen on a firm bed so your left / right forearm hangs over the edge of the bed and your upper arm is on the bed, straight out from your body. Your elbow should be bent. Your palm should be facing your feet. If instructed, hold a  5 lb weight in your hand. Squeeze your shoulder blade toward the middle of your back. Do not let your shoulder lift toward your ear. Keep your elbow bent in an "L" shape (90 degrees) while you slowly move your forearm up toward the ceiling. Move your forearm up to the height of the bed, toward your head. Your upper arm should not move. At the top of the movement, your palm should face the floor. Hold for 3 seconds. Slowly return to the starting position and relax your muscles. Repeat 3 times. Complete this exercise 3 times per week. Exercise E: Scapular retraction and external rotation, rowing    Sit in a stable chair  without armrests, or stand. Secure an exercise band to a stable object in front of you so it is at shoulder height. Hold one end of the exercise band in each hand. Bring your arms out straight in front of you. Step back, away from the secured end of the exercise band, until the band stretches. Pull the band backward. As you do this, bend your elbows and squeeze your shoulder blades together, but avoid letting the rest of your body move. Do not let your shoulders lift up toward your ears. Stop when your elbows are at your sides or slightly behind your body. Hold for 5 seconds. Slowly straighten your arms to return to the starting position. Repeat 2 times. Complete this exercise 3 times per week. Posture and body mechanics    Body mechanics refers to the movements and positions of your body while you do your daily activities. Posture is part of body mechanics. Good posture and healthy body mechanics can help to relieve stress in your body's tissues and joints. Good posture means that your spine is in its natural S-curve position (your spine is neutral), your shoulders are pulled back slightly, and your head is not tipped forward. The following are general guidelines for applying improved posture and body mechanics to your everyday activities. Standing    When standing, keep your spine neutral and your feet about hip-width apart. Keep a slight bend in your knees. Your ears, shoulders, and hips should line up. When you do a task in which you lean forward while standing in one place for a long time, place one foot up on a stable object that is 2-4 inches (5-10 cm) high, such as a footstool. This helps keep your spine neutral. Sitting    When sitting, keep your spine neutral and keep your feet flat on the floor. Use a footrest, if necessary, and keep your thighs parallel to the floor. Avoid rounding your shoulders, and avoid tilting your head forward. When working at a desk or a computer, keep your  desk at a height where your hands are slightly lower than your elbows. Slide your chair under your desk so you are close enough to maintain good posture. When working at a computer, place your monitor at a height where you are looking straight ahead and you do not have to tilt your head forward or downward to look at the screen. Resting    When lying down and resting, avoid positions that are most painful for you. If you have pain with activities such as sitting, bending, stooping, or squatting (flexion-based activities), lie in a position in which your body does not bend very much. For example, avoid curling up on your side with your arms and knees near your chest (fetal position). If you have pain with activities such as standing for  a long time or reaching with your arms (extension-based activities), lie with your spine in a neutral position and bend your knees slightly. Try the following positions: Lying on your side with a pillow between your knees. Lying on your back with a pillow under your knees.   Lifting    When lifting objects, keep your feet at least shoulder-width apart and tighten your abdominal muscles. Bend your knees and hips and keep your spine neutral. It is important to lift using the strength of your legs, not your back. Do not lock your knees straight out. Always ask for help to lift heavy or awkward objects. Make sure you discuss any questions you have with your health care provider.

## 2020-10-10 NOTE — Progress Notes (Signed)
   Covid-19 Vaccination [facility]  Name:  Amalio Loe    MRN: [id number] DOB: [birth year]  10/10/2020  Mr. Schurman was observed post Covid-19 immunization for 15 minutes without incident. He was provided with Vaccine Information Sheet and instruction to access the V-Safe system.   Mr. Antenucci was instructed to call 911 with any severe reactions post vaccine: Difficulty breathing  Swelling of face and throat  A fast heartbeat  A bad rash all over body  Dizziness and weakness

## 2020-10-10 NOTE — Progress Notes (Signed)
Musculoskeletal Exam  Patient: Raymond Christensen DOB: 1940-03-06  DOS: 10/10/2020  SUBJECTIVE:  Chief Complaint:   Chief Complaint  Patient presents with   Hospitalization Follow-up    Raymond Christensen is a 80 y.o.  male for evaluation and treatment of thoracic back pain. Here w his wife Arbie Cookey.   Went to ED on our office's recommendation due to concern for ACS which was ruled out by ED team.  Had an episode of turning pale and not responding to wife. Pt reports he was in pain and gathering himself. Wife was concerned for TIA. No neuro deficits since then.  Onset:  5 days ago. No inj or change in activity.  Location: L upper back pain near shoulder blader Character:  aching  Progression of issue:  has slightly improved Associated symptoms: none Denies decreased weakness, bruising, swelling, redness.  Treatment: to date has been ice, acetaminophen, oxycodone and heat.   Neurovascular symptoms: no  Past Medical History:  Diagnosis Date   Arthritis    CAD (coronary artery disease)    Carotid artery disease ([place])    s/p left CEA 05/23/91   Chronic systolic CHF (congestive heart failure) ([place])    Concussion Summer 2012   Essential hypertension 02/26/2017   Heart disease    Hyperlipidemia    Hypothyroidism 10/15/2017   Major depressive disorder    Mitral valve regurgitation    Myocardial infarction ([place]) 1996, 2021   Obstructive sleep apnea on CPAP    Persistent atrial fibrillation ([facility])    Poor flexibility of tendon 12/01/2018   S/P mitral valve clip implantation 12/23/2019   s/p TEER with MitraClip with one XTW by Dr. Burt Knack   Subungual hematoma of digit of hand 12/01/2018   Subungual hematoma of right foot 12/01/2018   Thyroid disease    Type 2 diabetes mellitus without complication, without long-term current use of insulin ([facility]) 02/26/2017    Objective: VITAL SIGNS: BP 128/74   Pulse 67   Temp 98.2 F (36.8 C) (Oral)   Ht 5\' 8"  (1.727 m)   Wt 240 lb 6 oz (109 kg)   SpO2  95%   BMI 36.55 kg/m  Constitutional: Well formed, well developed. No acute distress. Heart: RRR Thorax & Lungs: CTAB. No accessory muscle use Musculoskeletal: upper back.   Normal active range of motion: yes- of L shoulder Normal passive range of motion: yes- of L shoulder Tenderness to palpation: yes over L rhomboid at T5-6 Deformity: no Ecchymosis: no Tests positive: none Tests negative: Neer's, Spurling's Neurologic: Normal sensory function. No focal deficits noted. DTR's equal and symmetric in UE's. No clonus. Psychiatric: Normal mood. Age appropriate judgment and insight. Alert & oriented x 3.    Assessment:  Acute left-sided thoracic back pain - Plan: tiZANidine (ZANAFLEX) 4 MG tablet  Plan: Stretches/exercises, heat, ice, Tylenol. Send message in 1 mo if no improvement, will set up w PT.  I don't think he had a TIA, but even if he did, he is not having any residual neuro deficits warranting imaging. He has a hx of CAD and is on Lipitor 80 mg/d and ASA. We would not change management likely for a TIA.   F/u as originally scheduled. The patient and his spouse voiced understanding and agreement to the plan.  Total time: 33 min spent discussing the above in add'n to reviewing ER visit on same day of visit.   Kane, DO 10/10/20  3:32 PM

## 2020-10-15 ENCOUNTER — Other Ambulatory Visit: Payer: Self-pay | Admitting: Family Medicine

## 2020-10-20 ENCOUNTER — Other Ambulatory Visit (HOSPITAL_BASED_OUTPATIENT_CLINIC_OR_DEPARTMENT_OTHER): Payer: Self-pay

## 2020-10-20 MED ORDER — COVID-19MRNA BIVAL VACC PFIZER 30 MCG/0.3ML IM SUSP
INTRAMUSCULAR | 0 refills | Status: DC
  Filled 2020-10-20: qty 0.3, 1d supply, fill #0

## 2020-10-24 ENCOUNTER — Other Ambulatory Visit (HOSPITAL_BASED_OUTPATIENT_CLINIC_OR_DEPARTMENT_OTHER): Payer: Self-pay

## 2020-10-24 MED ORDER — INFLUENZA VAC A&B SA ADJ QUAD 0.5 ML IM PRSY
PREFILLED_SYRINGE | INTRAMUSCULAR | 0 refills | Status: DC
  Filled 2020-10-24: qty 0.5, 1d supply, fill #0

## 2020-10-31 ENCOUNTER — Other Ambulatory Visit: Payer: Self-pay | Admitting: Physician Assistant

## 2020-10-31 DIAGNOSIS — Z9889 Other specified postprocedural states: Secondary | ICD-10-CM

## 2020-10-31 DIAGNOSIS — Z95818 Presence of other cardiac implants and grafts: Secondary | ICD-10-CM

## 2020-10-31 DIAGNOSIS — I340 Nonrheumatic mitral (valve) insufficiency: Secondary | ICD-10-CM

## 2020-11-03 ENCOUNTER — Other Ambulatory Visit: Payer: Self-pay | Admitting: Cardiology

## 2020-11-03 DIAGNOSIS — R600 Localized edema: Secondary | ICD-10-CM

## 2020-11-17 ENCOUNTER — Telehealth: Payer: Self-pay | Admitting: Family Medicine

## 2020-11-17 NOTE — Telephone Encounter (Signed)
Janett Billow a representative calling from [employer] is calling for the patient in regard of a lab done here on 08/15 for Vit. D. She states that she has been trying to figure out who to speak to and has even spoken to billing about it. The patient was billed $95 for this lab and should have not been billed because a wrong order code was put on our end. She stated that the patient should not be billed for it, and the right code needs to be submitted for that lab. She can be reached at [phone] to explain the matter better, and she stated she has a secured voicemail so she would be able to call back if a number is left.

## 2020-11-17 NOTE — Telephone Encounter (Signed)
I called Raymond Christensen back and advised the coding had previously been reviewed and it stands as is as pt was seen on 08/28/20 for fatigue.  She asked for the medical records phone and fax number so they could appeal the claim.  I provided that information to her.

## 2020-12-03 ENCOUNTER — Other Ambulatory Visit: Payer: Self-pay | Admitting: Family Medicine

## 2021-01-02 NOTE — Progress Notes (Signed)
[facility]                                       Cardiology Office Note    Date:  01/04/2021   ID:  Raymond Christensen, DOB 1940/09/27, MRN [id number]  PCP:  [name], DO  Primary Cardiologist: Sinclair Grooms, MD  Primary Electrophysiologist:  [name], MD   CC: 1 year s/p Mitraclip  History of Present Illness:  Raymond Christensen is a 80 y.o. male with CAD with MI 1994 c/b cardiac arrest and lytrics/PTCA, CABG in 2001, recent NSTEMI with PCI 08/2019, ischemic cardiomyopathy/chronic systolic CHF, HTN, OSA, cerebrovascular disease with previous CEA, right subclavian stenosis, persistent atrial fibrillation that has failed cardioversion on Eliquis, DMT2, CKD III per labs, hypothyroidism, obesity, and severe symptomatic mitral regurgitation s/p TEER (12/23/19) who presents to clinic for follow up.  In 08/2019 he had presented to the hospital with NSTEMI and underwent cardiac cath and PCI. He underwent vein graft PCI of the sequential SVG-OM and PDA, treated with drug eluting stents. He was also noted to be in atrial fibrillation. He was started on anticoagulation. He underwent TEE which demonstrated moderately severe MR with A2 partial flail segment with ruptured chordae tendinae. LAA thrombus was demonstrated so he did not undergo DCCV at that time. He was subsequently loaded with oral amiodarone and ultimately underwent cardioversion but reverted back to atrial fibrillation at follow-up. He was referred for structural heart consultation for treatment options related to mitral regurgitation and ultimately felt to be a candidate for Mitra-Clip.   He underwent successful transcatheter edge-to-edge mitral valve repair using a MitraClip XTW device, positioned A2/P2, reducing mitral regurgitation from 4+ to 1+ on 12/25/19. Procedure was complicated by bradycardic arrest which promptly resolved with resuscitation. Admission also  notable for hematuria. He was seen by urology who planned outpatient follow up. Post op echo showed only mild MR. 1 month s/p mitraclip echo showed EF 40-45%, mild residual MR with resolution of atrial shunt  He underwent afib ablation on 02/01/20 with Dr. Curt Bears. He had been having issues with bleeding with Plavix and Eliquis. Plavix was discontinued. There was some discussion about [facility]. He has been maintaining sinus since ablation.   Today he presents to clinic for follow up. Does have some morning fatigue that seems to lift after a few hours. No CP or SOB. No LE edema, orthopnea or PND. No dizziness or syncope. No blood in stool or urine. No palpitations. He was exercising regularly but stopped recently and doesn't know why. [facility] Has an intentional tremor that comes and goes in his hand. Golden yellow urine noted we as well. No issues with bleeding since being taken off plavix.   Past Medical History:  Diagnosis Date   Arthritis    CAD (coronary artery disease)    Carotid artery disease ([place])    s/p left CEA 01/16/06   Chronic systolic CHF (congestive heart failure) ([place])    Concussion Summer 2012   Essential hypertension 02/26/2017   Heart disease    Hyperlipidemia    Hypothyroidism 10/15/2017   Major depressive disorder    Mitral valve regurgitation    Myocardial infarction ([place]) 1996, 2021   Obstructive sleep apnea on CPAP    Persistent atrial fibrillation ([facility])    Poor flexibility of tendon  12/01/2018   S/P mitral valve clip implantation 12/23/2019   s/p TEER with MitraClip with one XTW by Dr. Burt Knack   Subungual hematoma of digit of hand 12/01/2018   Subungual hematoma of right foot 12/01/2018   Thyroid disease    Type 2 diabetes mellitus without complication, without long-term current use of insulin ([place]) 02/26/2017    Past Surgical History:  Procedure Laterality Date   ATRIAL FIBRILLATION ABLATION N/A 02/01/2020   Procedure: ATRIAL FIBRILLATION  ABLATION;  Surgeon: [name], MD;  Location: [facility];  Service: Cardiovascular;  Laterality: N/A;   BYPASS GRAFT  2001   CARDIAC CATHETERIZATION     CARDIOVERSION N/A 11/08/2019   Procedure: CARDIOVERSION;  Surgeon: [name], MD;  Location: [facility];  Service: Cardiovascular;  Laterality: N/A;   CAROTID ENDARTERECTOMY Left 01/17/2014   CORONARY ANGIOPLASTY     CORONARY ARTERY BYPASS GRAFT     MITRAL VALVE REPAIR N/A 12/23/2019   Procedure: MITRAL VALVE REPAIR;  Surgeon: [name], MD;  Location: [facility];  Service: Cardiovascular;  Laterality: N/A;   RIGHT/LEFT HEART CATH AND CORONARY/GRAFT ANGIOGRAPHY N/A 09/09/2019   Procedure: RIGHT/LEFT HEART CATH AND CORONARY/GRAFT ANGIOGRAPHY;  Surgeon: [name], MD;  Location: [facility];  Service: Cardiovascular;  Laterality: N/A;   TEE WITHOUT CARDIOVERSION N/A 09/13/2019   Procedure: TRANSESOPHAGEAL ECHOCARDIOGRAM (TEE);  Surgeon: [name], MD;  Location: [facility];  Service: Cardiovascular;  Laterality: N/A;   TEE WITHOUT CARDIOVERSION N/A 12/23/2019   Procedure: TRANSESOPHAGEAL ECHOCARDIOGRAM (TEE);  Surgeon: [name], MD;  Location: [facility];  Service: Cardiovascular;  Laterality: N/A;   TEE WITHOUT CARDIOVERSION  02/01/2020   Procedure: TRANSESOPHAGEAL ECHOCARDIOGRAM (TEE);  Surgeon: [name], MD;  Location: [facility];  Service: Cardiovascular;;   TOTAL HIP ARTHROPLASTY  1994, 1995    Current Medications: Outpatient Medications Prior to Visit  Medication Sig Dispense Refill   acetaminophen (TYLENOL) 500 MG tablet Take 1,000 mg by mouth every 6 (six) hours as needed for moderate pain or headache.     amoxicillin (AMOXIL) 500 MG tablet Take 4 tablets (2,000 mg total) one hour prior to all dental visits. 8 tablet 11   apixaban (ELIQUIS) 5 MG TABS tablet Take 1 tablet (5 mg total) by mouth 2 (two) times daily. 180 tablet 3    atorvastatin (LIPITOR) 80 MG tablet TAKE 1 TABLET DAILY 90 tablet 2   Cholecalciferol (VITAMIN D3) 250 MCG (10000 UT) capsule Take 10,000 Units by mouth daily.      COVID-19 mRNA bivalent vaccine, Pfizer, injection Inject into the muscle. 0.3 mL 0   ENTRESTO 24-26 MG TAKE 1 TABLET TWICE A DAY 180 tablet 3   ezetimibe (ZETIA) 10 MG tablet TAKE 1 TABLET DAILY 90 tablet 3   famotidine (PEPCID) 20 MG tablet Take 1 tablet (20 mg total) by mouth daily. 30 tablet 3   finasteride (PROSCAR) 5 MG tablet TAKE 1 TABLET DAILY 90 tablet 1   furosemide (LASIX) 40 MG tablet TAKE 1 TABLET DAILY 90 tablet 3   HYDROcodone-acetaminophen (NORCO/VICODIN) 5-325 MG tablet Take 1 tablet by mouth every 6 (six) hours as needed for moderate pain or severe pain. 20 tablet 0   levocetirizine (XYZAL) 5 MG tablet TAKE 1 TABLET EVERY EVENING 90 tablet 1   levothyroxine (SYNTHROID) 75 MCG tablet TAKE 1 TABLET DAILY 90 tablet 1   liothyronine (CYTOMEL) 5 MCG tablet TAKE 2 TABLETS DAILY 180 tablet 3  metoprolol succinate (TOPROL-XL) 50 MG 24 hr tablet Take 1 tablet (50 mg total) by mouth daily. Take with or immediately following a meal. 30 tablet 6   Multiple Vitamins-Minerals (MULTIVITAMIN ADULT EXTRA C PO) Take 1 tablet by mouth daily.      nitroGLYCERIN (NITROSTAT) 0.4 MG SL tablet Place 1 tablet (0.4 mg total) under the tongue every 5 (five) minutes as needed for chest pain. 30 tablet 12   potassium chloride (KLOR-CON) 10 MEQ tablet TAKE 2 TABLETS DAILY AS    NEEDED WITH EVERY 40MG  DOSEOF LASIX 180 tablet 2   spironolactone (ALDACTONE) 25 MG tablet TAKE 1/2 TABLET DAILY 90 tablet 1   tamsulosin (FLOMAX) 0.4 MG CAPS capsule TAKE 1 CAPSULE DAILY (Patient taking differently: Take 0.4 mg by mouth in the morning and at bedtime.) 90 capsule 3   tiZANidine (ZANAFLEX) 4 MG tablet Take 1 tablet (4 mg total) by mouth every 6 (six) hours as needed for muscle spasms. 30 tablet 0   vitamin B-12 (CYANOCOBALAMIN) 1000 MCG tablet Take 1,000  mcg by mouth daily.     zinc gluconate 50 MG tablet Take 50 mg by mouth daily.     influenza vaccine adjuvanted (FLUAD) 0.5 ML injection Inject into the muscle. (Patient not taking: Reported on 01/03/2021) 0.5 mL 0   nortriptyline (PAMELOR) 25 MG capsule Take 1 capsule (25 mg total) by mouth at bedtime. (Patient not taking: Reported on 01/03/2021) 30 capsule 2   omega-3 acid ethyl esters (LOVAZA) 1 g capsule TAKE 2 CAPSULES TWICE DAILY (Patient not taking: Reported on 01/03/2021) 360 capsule 2   OXcarbazepine (TRILEPTAL) 150 MG tablet Take 150 mg by mouth 2 (two) times daily.     sertraline (ZOLOFT) 100 MG tablet Take 100 mg by mouth daily.     No facility-administered medications prior to visit.     Allergies:   Sulfa antibiotics   Social History   Socioeconomic History   Marital status: Married    Spouse name: Not on file   Number of children: 2   Years of education: 80   Highest education level: Doctorate  Occupational History   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  Tobacco Use   Smoking status: Former    Types: Cigarettes    Quit date: 1980    Years since quitting: 43.0   Smokeless tobacco: Never  Vaping Use   Vaping Use: Never used  Substance and Sexual Activity   Alcohol use: Yes    Alcohol/week: 4.0 standard drinks    Types: 1 Cans of beer, 3 Standard drinks or equivalent per week    Comment: 1 drink 3 times per week   Drug use: No   Sexual activity: Yes  Other Topics Concern   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     ROS:   Please see the history of present illness.    ROS All other systems reviewed and are negative.   PHYSICAL  EXAM:   VS:  BP (!) 106/58    Pulse 69    Ht 5\' 8"  (1.727 m)    Wt 246 lb (111.6 kg)    SpO2 97%    BMI 37.40 kg/m    GEN: Well nourished, well developed, in no acute distress, overweight HEENT: normal Neck: no JVD or masses Cardiac: irreg irreg; no murmurs, rubs, or gallops,no edema  Respiratory:  clear to auscultation bilaterally, normal work of breathing GI: soft, nontender, nondistended, + BS MS: no deformity or atrophy Skin: warm and dry, no rash.  Groin site clear without hematoma or ecchymosis  Neuro:  Alert and Oriented x 3, Strength and sensation are intact Psych: euthymic mood, full affect   Wt Readings from Last 3 Encounters:  01/03/21 246 lb (111.6 kg)  10/10/20 240 lb 6 oz (109 kg)  10/06/20 239 lb (108.4 kg)      Studies/Labs Reviewed:   EKG:  EKG is NOT ordered today.   Recent Labs: 06/26/2020: NT-Pro BNP 137 08/28/2020: TSH 4.38 10/06/2020: ALT 38; BUN 19; Creatinine, Ser 1.13; Hemoglobin 14.3; Platelets 143; Potassium 4.5; Sodium 136   Lipid Panel    Component Value Date/Time   CHOL 113 08/28/2020 0955   CHOL 110 04/24/2020 1006   TRIG 150.0 (H) 08/28/2020 0955   HDL 37.00 (L) 08/28/2020 0955   HDL 42 04/24/2020 1006   CHOLHDL 3 08/28/2020 0955   VLDL 30.0 08/28/2020 0955   LDLCALC 46 08/28/2020 0955   LDLCALC 37 04/24/2020 1006   LDLDIRECT 76.0 06/22/2019 1503    Additional studies/ records that were reviewed today include:  MV Clip 12/23/19 Successful transcatheter edge-to-edge mitral valve repair using a MitraClip XTW device, positioned A2/P2, reducing mitral regurgitation from 4+ to 1+.   Procedure complicated by transient bradycardic arrest likely precipitated by coronary air embolism, with prompt resuscitation and return to baseline hemodynamics/cardiac function   Recommendations: Continue clopidogrel, resume  apixaban tomorrow morning, assess recovery in the post cardiac catheterization recovery area to determine bed placement in either a cardiac progressive care bed versus ICU   TEE 12/23/19  1. Native valve: with primary mitral regurgutation, A2 flail with  posteriorly directed eccentric jet wrapping around the left atrium, flail  gap 3 mm, PISA radius: 1.2 cm, ERO 0.7 cm2, Reg Vol 101 ml consistent with  severe mitral regurgitation. MVA 8  cm2. A XTW MitraClip was successfully placed in the A2-P2 position with  improvement of mitral regurgitation to mild. Mean transmitral gradient  increased from 2 to 5 mmHg. Mitral regurgitation improved from severe to  mild. LVEF remained the same post  procedu 40-45%. Mild RV systolic dysfunction. There wa trivial pericardial  effusion prior and post procedure. Post procedure there was a small  iatrogenic ASD with left to right flow only.   2. Left ventricular ejection fraction, by estimation, is 40 to 45%. The  left ventricle has mildly decreased function. The left ventricle  demonstrates global hypokinesis. Left ventricular diastolic function could  not be evaluated.   3. Right ventricular systolic function is mildly reduced. The right  ventricular size is mildly enlarged.   4. Left atrial size was severely dilated. No left atrial/left atrial  appendage thrombus was detected.   5. Right atrial size was moderately dilated.   6. The mitral valve is normal in structure. Severe mitral valve  regurgitation. No evidence of mitral stenosis. There is severe  holosystolic prolapse of the middle segment of the anterior leaflet of the  mitral  valve.   7. Tricuspid valve regurgitation is mild to moderate.   8. The aortic valve is tricuspid. There is mild calcification of the  aortic valve. There is moderate thickening of the aortic valve. Aortic  valve regurgitation is mild. No aortic stenosis is present.   9. The inferior vena cava is normal in size with  greater than 50%  respiratory variability, suggesting right atrial pressure of 3 mmHg.   Conclusion(s)/Recommendation(s): Normal biventricular function without  evidence of hemodynamically significant valvular heart disease.    ______________________  F/u echo 12/24/19  1. Day 1 post Mitraclip placement, LVEF 40-45%, mitral regurgitation  mild, mean transmitral gradient 2 mmHg.   2. Left ventricular ejection fraction, by estimation, is 40 to 45%. The  left ventricle has mildly decreased function. The left ventricle  demonstrates global hypokinesis. Left ventricular diastolic function could  not be evaluated.   3. Right ventricular systolic function is mildly reduced. The right  ventricular size is normal.   4. Left atrial size was severely dilated.   5. Right atrial size was moderately dilated.   6. The mitral valve is normal in structure. Mild mitral valve  regurgitation. No evidence of mitral stenosis. The mean mitral valve  gradient is 2.0 mmHg with average heart rate of 61 bpm. There is a XTW  MitralClip present in the mitral position.  Procedure Date: 12/23/2019.   7. The aortic valve is normal in structure. Aortic valve regurgitation is  not visualized. No aortic stenosis is present.   8. The inferior vena cava is normal in size with greater than 50%  respiratory variability, suggesting right atrial pressure of 3 mmHg.     _____________  Echo 01/03/21 IMPRESSIONS   1. Left ventricular ejection fraction, by estimation, is 50 to 55%. The left ventricle has low normal function. The left ventricle has no regional wall motion abnormalities. Left ventricular diastolic parameters are indeterminate.  2. Right ventricular systolic function is normal. The right ventricular size is mildly enlarged. There is normal pulmonary artery systolic pressure.  3. Left atrial size was mild to moderately dilated.  4. The mitral valve has been repaired/replaced. Mild mitral valve regurgitation. The  mean mitral valve gradient is 2.0 mmHg. There is a Mitra-Clip with one XTW present in the mitral position. Procedure Date: 12/23/2019.  5. The aortic valve is grossly normal. There is mild calcification of the aortic valve. There is mild thickening of the aortic valve. Aortic valve regurgitation is trivial. Aortic valve sclerosis is present, with no evidence of aortic valve stenosis.  6. The inferior vena cava is normal in size with <50% respiratory variability, suggesting right atrial pressure of 8 mmHg.   Comparison(s): Prior images reviewed side by side. Changes from prior study are noted. EF slightly improved on current study.  ASSESSMENT & PLAN:   Severe MR s/p TEER: doing excellent 1 year out from TEER. Echo today shows EF 50-55%, normally functioning Clip with mild residual MR and a mean gradient of 2 mm hg. He has NYHA class I symptoms and doing quite well. He has amoxicillin for SBE prophylaxis. Continue on Eliquis alone.   CAD s/p CABG and PCI in the setting of NSTEMI in 08/2019: doing well with no angina. Plavix discontinued by Dr. Tamala Julian due to excessive bleeding in combination with Eliquis. Continue medical therapy.   Chronic combined S/D CHF:  EF continues to improve. 50-55% by echo today. Continue GDMT with Entresto, Toprol XL and spiro. He appears euvolemic without additional diuretics.  Persistent afib/flutter: maintaining sinus since afib ablation in 01/2020. Continue Eliquis.   Hx of hematuria and bruising: this has resolved with the discontinuation of plavix.   Morbid obesity: Body mass index is 37.4 kg/m. He is very motivated to get back to the gym or start exercising again. I encouraged walking 3-5x a week.    Medication Adjustments/Labs and Tests Ordered: Current medicines are reviewed at length with the patient today.  Concerns regarding medicines are outlined above.  Medication changes, Labs and Tests ordered today are listed in the Patient Instructions below. Patient  Instructions  Medication Instructions:  Your physician recommends that you continue on your current medications as directed. Please refer to the Current Medication list given to you today.  *If you need a refill on your cardiac medications before your next appointment, please call your pharmacy*   Lab Work: None ordered   If you have labs (blood work) drawn today and your tests are completely normal, you will receive your results only by: [place] (if you have MyChart) OR A paper copy in the mail If you have any lab test that is abnormal or we need to change your treatment, we will call you to review the results.   Testing/Procedures: None ordered    Follow-Up: Follow up as scheduled    Other Instructions None     Signed, Angelena Form, PA-C  01/04/2021 10:31 AM    [facility] [place], [place], [place]  [address] Phone: [phone]; Fax: [phone]

## 2021-01-03 ENCOUNTER — Other Ambulatory Visit: Payer: Self-pay

## 2021-01-03 ENCOUNTER — Ambulatory Visit: Payer: Medicare (Managed Care) | Admitting: Physician Assistant

## 2021-01-03 ENCOUNTER — Ambulatory Visit (HOSPITAL_COMMUNITY): Payer: Medicare (Managed Care) | Attending: Cardiology

## 2021-01-03 ENCOUNTER — Encounter: Payer: Self-pay | Admitting: Physician Assistant

## 2021-01-03 VITALS — BP 106/58 | HR 69 | Ht 68.0 in | Wt 246.0 lb

## 2021-01-03 DIAGNOSIS — Z954 Presence of other heart-valve replacement: Secondary | ICD-10-CM | POA: Diagnosis not present

## 2021-01-03 DIAGNOSIS — R319 Hematuria, unspecified: Secondary | ICD-10-CM

## 2021-01-03 DIAGNOSIS — Z95818 Presence of other cardiac implants and grafts: Secondary | ICD-10-CM | POA: Insufficient documentation

## 2021-01-03 DIAGNOSIS — I25700 Atherosclerosis of coronary artery bypass graft(s), unspecified, with unstable angina pectoris: Secondary | ICD-10-CM

## 2021-01-03 DIAGNOSIS — I4819 Other persistent atrial fibrillation: Secondary | ICD-10-CM

## 2021-01-03 DIAGNOSIS — Z9889 Other specified postprocedural states: Secondary | ICD-10-CM | POA: Diagnosis present

## 2021-01-03 DIAGNOSIS — I340 Nonrheumatic mitral (valve) insufficiency: Secondary | ICD-10-CM

## 2021-01-03 DIAGNOSIS — I5022 Chronic systolic (congestive) heart failure: Secondary | ICD-10-CM

## 2021-01-03 NOTE — Patient Instructions (Signed)

## 2021-01-04 ENCOUNTER — Other Ambulatory Visit: Payer: Self-pay | Admitting: Family Medicine

## 2021-01-04 LAB — ECHOCARDIOGRAM COMPLETE
Area-P 1/2: 2.75 cm2
MV VTI: 1.98 cm2
P 1/2 time: 398 msec
S' Lateral: 3.9 cm

## 2021-02-05 ENCOUNTER — Encounter: Payer: Self-pay | Admitting: Family Medicine

## 2021-02-17 NOTE — Progress Notes (Signed)
Cardiology Office Note Date:  02/17/2021  Patient ID:  Raymond Christensen, Raymond Christensen Jul 22, 1940, MRN [id number] PCP:  [name], DO  Cardiologist:  Dr. Tamala Julian AND Dr. Sharlet Salina ([place], sees him annually) Electrophysiologist: Dr. Curt Bears Structural: Dr. Burt Knack    Chief Complaint:  6 mo  History of Present Illness: Raymond Christensen is a 81 y.o. male with history of CAD (CABG 2001), AFib, PVD (01/17/2014 L. CEA (Dr. Tobie Poet)),  R subclavian stenosis, hypothyroidism, HTN, HLD, obesity, OSA (w/CPAP), VHD (s/p Mitra clip 12/24/19), DM, chronic CHF (systolic/diastolic)  He comes in today to be seen for Dr. Curt Bears, last seen by him Aug 2022, c/o fatigue, had a fall with significant bleeding from arm wound. Introduced idea of perhaps watchman consult, he was in [place] with no symptoms of his AFib His coreg changed to Toprol to try and improve his fatigue  He saw Dr. Tamala Julian 08/21/20, suspected hi fatigue 2/2 outdated CPAP equipment, with replacement supply shortage. BP lower side, but feeling well, not change No plans yet for watchman without systemic bleeding   Saw K. Grandville Silos, PA-C 01/03/21, was doing pretty well, reported some fatigue that improved through the day, had been exercising though for no particular treason, this had fallen off with plans to resume. LVEF 50-55%, normally functioning clip, class I symptoms No bleeding of Plavix  TODAY He is accompanied by his wife. He is doing well. Still not exercising, says she has just gotten lazy, denies any symptoms or particular reason for not getting back to the gym. He got his new CPAP equipment and reports compliance, has had some small nose bleeds he thinks 2/2 the mask now is nasal prongs and irritative  He denies any CP, palpitations or cardiac awareness. He was previously unaware of his AFib and is not sur if he would know if he had any, though he does have a watch that has the capacity to alert him to AFib  and it has not  No syncope or near  syncope.  He has some edema today, reports that this waxes/wanes  AFib Hx Diagnosed post op (CABG) 2001 Mitra Clip 12/24/19 PVI ablation 02/01/20 Amiodarone stopped post ablation  Past Medical History:  Diagnosis Date   Arthritis    CAD (coronary artery disease)    Carotid artery disease ([place])    s/p left CEA 07/22/27   Chronic systolic CHF (congestive heart failure) ([place])    Concussion Summer 2012   Essential hypertension 02/26/2017   Heart disease    Hyperlipidemia    Hypothyroidism 10/15/2017   Major depressive disorder    Mitral valve regurgitation    Myocardial infarction ([place]) 1996, 2021   Obstructive sleep apnea on CPAP    Persistent atrial fibrillation (HCC)    Poor flexibility of tendon 12/01/2018   S/P mitral valve clip implantation 12/23/2019   s/p TEER with MitraClip with one XTW by Dr. Burt Knack   Subungual hematoma of digit of hand 12/01/2018   Subungual hematoma of right foot 12/01/2018   Thyroid disease    Type 2 diabetes mellitus without complication, without long-term current use of insulin ([place]) 02/26/2017    Past Surgical History:  Procedure Laterality Date   ATRIAL FIBRILLATION ABLATION N/A 02/01/2020   Procedure: ATRIAL FIBRILLATION ABLATION;  Surgeon: [name], MD;  Location: [facility];  Service: Cardiovascular;  Laterality: N/A;   BYPASS GRAFT  2001   CARDIAC CATHETERIZATION     CARDIOVERSION N/A 11/08/2019   Procedure: CARDIOVERSION;  Surgeon: Jenkins Rouge  C, MD;  Location: [facility];  Service: Cardiovascular;  Laterality: N/A;   CAROTID ENDARTERECTOMY Left 01/17/2014   CORONARY ANGIOPLASTY     CORONARY ARTERY BYPASS GRAFT     MITRAL VALVE REPAIR N/A 12/23/2019   Procedure: MITRAL VALVE REPAIR;  Surgeon: [name], MD;  Location: [facility];  Service: Cardiovascular;  Laterality: N/A;   RIGHT/LEFT HEART CATH AND CORONARY/GRAFT ANGIOGRAPHY N/A 09/09/2019   Procedure: RIGHT/LEFT HEART CATH AND CORONARY/GRAFT  ANGIOGRAPHY;  Surgeon: [name], MD;  Location: [facility];  Service: Cardiovascular;  Laterality: N/A;   TEE WITHOUT CARDIOVERSION N/A 09/13/2019   Procedure: TRANSESOPHAGEAL ECHOCARDIOGRAM (TEE);  Surgeon: [name], MD;  Location: [facility];  Service: Cardiovascular;  Laterality: N/A;   TEE WITHOUT CARDIOVERSION N/A 12/23/2019   Procedure: TRANSESOPHAGEAL ECHOCARDIOGRAM (TEE);  Surgeon: [name], MD;  Location: [facility];  Service: Cardiovascular;  Laterality: N/A;   TEE WITHOUT CARDIOVERSION  02/01/2020   Procedure: TRANSESOPHAGEAL ECHOCARDIOGRAM (TEE);  Surgeon: [name], MD;  Location: [facility];  Service: Cardiovascular;;   TOTAL HIP ARTHROPLASTY  1994, 1995    Current Outpatient Medications  Medication Sig Dispense Refill   acetaminophen (TYLENOL) 500 MG tablet Take 1,000 mg by mouth every 6 (six) hours as needed for moderate pain or headache.     amoxicillin (AMOXIL) 500 MG tablet Take 4 tablets (2,000 mg total) one hour prior to all dental visits. 8 tablet 11   apixaban (ELIQUIS) 5 MG TABS tablet Take 1 tablet (5 mg total) by mouth 2 (two) times daily. 180 tablet 3   atorvastatin (LIPITOR) 80 MG tablet TAKE 1 TABLET DAILY 90 tablet 2   Cholecalciferol (VITAMIN D3) 250 MCG (10000 UT) capsule Take 10,000 Units by mouth daily.      COVID-19 mRNA bivalent vaccine, Pfizer, injection Inject into the muscle. 0.3 mL 0   ENTRESTO 24-26 MG TAKE 1 TABLET TWICE A DAY 180 tablet 3   ezetimibe (ZETIA) 10 MG tablet TAKE 1 TABLET DAILY 90 tablet 3   famotidine (PEPCID) 20 MG tablet Take 1 tablet (20 mg total) by mouth daily. 30 tablet 3   finasteride (PROSCAR) 5 MG tablet TAKE 1 TABLET DAILY 90 tablet 1   furosemide (LASIX) 40 MG tablet TAKE 1 TABLET DAILY 90 tablet 3   HYDROcodone-acetaminophen (NORCO/VICODIN) 5-325 MG tablet Take 1 tablet by mouth every 6 (six) hours as needed for moderate pain or severe pain. 20 tablet 0    influenza vaccine adjuvanted (FLUAD) 0.5 ML injection Inject into the muscle. (Patient not taking: Reported on 01/03/2021) 0.5 mL 0   levocetirizine (XYZAL) 5 MG tablet TAKE 1 TABLET EVERY EVENING 90 tablet 1   levothyroxine (SYNTHROID) 75 MCG tablet TAKE 1 TABLET DAILY 90 tablet 1   liothyronine (CYTOMEL) 5 MCG tablet TAKE 2 TABLETS DAILY 180 tablet 3   metoprolol succinate (TOPROL-XL) 50 MG 24 hr tablet Take 1 tablet (50 mg total) by mouth daily. Take with or immediately following a meal. 30 tablet 6   Multiple Vitamins-Minerals (MULTIVITAMIN ADULT EXTRA C PO) Take 1 tablet by mouth daily.      nitroGLYCERIN (NITROSTAT) 0.4 MG SL tablet Place 1 tablet (0.4 mg total) under the tongue every 5 (five) minutes as needed for chest pain. 30 tablet 12   nortriptyline (PAMELOR) 25 MG capsule Take 1 capsule (25 mg total) by mouth at bedtime. (Patient not taking: Reported on 01/03/2021) 30 capsule 2   omega-3 acid ethyl esters (  LOVAZA) 1 g capsule TAKE 2 CAPSULES TWICE DAILY (Patient not taking: Reported on 01/03/2021) 360 capsule 2   OXcarbazepine (TRILEPTAL) 150 MG tablet Take 150 mg by mouth 2 (two) times daily.     potassium chloride (KLOR-CON) 10 MEQ tablet TAKE 2 TABLETS DAILY AS    NEEDED WITH EVERY 40MG  DOSEOF LASIX 180 tablet 2   sertraline (ZOLOFT) 100 MG tablet Take 100 mg by mouth daily.     spironolactone (ALDACTONE) 25 MG tablet TAKE 1/2 TABLET DAILY 90 tablet 1   tamsulosin (FLOMAX) 0.4 MG CAPS capsule TAKE 1 CAPSULE DAILY (Patient taking differently: Take 0.4 mg by mouth in the morning and at bedtime.) 90 capsule 3   tiZANidine (ZANAFLEX) 4 MG tablet Take 1 tablet (4 mg total) by mouth every 6 (six) hours as needed for muscle spasms. 30 tablet 0   vitamin B-12 (CYANOCOBALAMIN) 1000 MCG tablet Take 1,000 mcg by mouth daily.     zinc gluconate 50 MG tablet Take 50 mg by mouth daily.     No current facility-administered medications for this visit.    Allergies:   Sulfa antibiotics   Social  History:  The patient  reports that he quit smoking about 43 years ago. His smoking use included cigarettes. He has never used smokeless tobacco. He reports current alcohol use of about 4.0 standard drinks per week. He reports that he does not use drugs.   Family History:  The patient's family history includes Dementia in his father.  ROS:  Please see the history of present illness.    All other systems are reviewed and otherwise negative.   PHYSICAL EXAM:  VS:  There were no vitals taken for this visit. BMI: There is no height or weight on file to calculate BMI. Well nourished, well developed, in no acute distress HEENT: normocephalic, atraumatic Neck: no JVD, carotid bruits or masses Cardiac:  RRR; no significant murmurs, no rubs, or gallops Lungs:  CTA b/l, no wheezing, rhonchi or rales Abd: soft, nontender MS: no deformity or atrophy Ext: 1++ edema Skin: warm and dry, no rash Neuro:  No gross deficits appreciated Psych: euthymic mood, full affect   EKG:  not done today  Echo 01/03/21 IMPRESSIONS   1. Left ventricular ejection fraction, by estimation, is 50 to 55%. The left ventricle has low normal function. The left ventricle has no regional wall motion abnormalities. Left ventricular diastolic parameters are indeterminate.  2. Right ventricular systolic function is normal. The right ventricular size is mildly enlarged. There is normal pulmonary artery systolic pressure.  3. Left atrial size was mild to moderately dilated.  4. The mitral valve has been repaired/replaced. Mild mitral valve regurgitation. The mean mitral valve gradient is 2.0 mmHg. There is a Mitra-Clip with one XTW present in the mitral position. Procedure Date: 12/23/2019.  5. The aortic valve is grossly normal. There is mild calcification of the aortic valve. There is mild thickening of the aortic valve. Aortic valve regurgitation is trivial. Aortic valve sclerosis is present, with no evidence of aortic valve  stenosis.  6. The inferior vena cava is normal in size with <50% respiratory variability, suggesting right atrial pressure of 8 mmHg.   Comparison(s): Prior images reviewed side by side. Changes from prior study are noted. EF slightly improved on current study.  02/01/20: EPS/ablation CONCLUSIONS: 1. Atrial fibrillation upon presentation.   2. Successful electrical isolation and anatomical encircling of all four pulmonary veins with radiofrequency current.  A WACA approach was used 3.  Additional left atrial ablation was performed with a standard box lesion created along the posterior wall of the left atrium 4. No early apparent complications   TEE 62/56/3893  1. Day 1 post Mitraclip placement, LVEF 40-45%, mitral regurgitation  mild, mean transmitral gradient 2 mmHg.   2. Left ventricular ejection fraction, by estimation, is 40 to 45%. The  left ventricle has mildly decreased function. The left ventricle  demonstrates global hypokinesis. Left ventricular diastolic function could  not be evaluated.   3. Right ventricular systolic function is mildly reduced. The right  ventricular size is normal.   4. Left atrial size was severely dilated.   5. Right atrial size was moderately dilated.   6. The mitral valve is normal in structure. Mild mitral valve  regurgitation. No evidence of mitral stenosis. The mean mitral valve  gradient is 2.0 mmHg with average heart rate of 61 bpm. There is a XTW  MitralClip present in the mitral position.  Procedure Date: 12/23/2019.   7. The aortic valve is normal in structure. Aortic valve regurgitation is  not visualized. No aortic stenosis is present.   8. The inferior vena cava is normal in size with greater than 50%  respiratory variability, suggesting right atrial pressure of 3 mmHg.      RHC/LHC 09/10/19 Mid LM to Dist LM lesion is 70% stenosed. 1st Mrg lesion is 75% stenosed. 2nd Mrg-1 lesion is 90% stenosed. 2nd Mrg-2 lesion is 95% stenosed. Mid  RCA to Dist RCA lesion is 100% stenosed. Prox RCA to Mid RCA lesion is 90% stenosed. 2nd Diag lesion is 75% stenosed. Origin to Prox Graft lesion between 2nd Mrg and RPDA is 99% stenosed. Prox Graft to Mid Graft lesion between Ramus and 2nd Mrg is 75% stenosed. A drug-eluting stent was successfully placed using a STENT RESOLUTE ONYX 3.0X18. Post intervention, there is a 0% residual stenosis. A drug-eluting stent was successfully placed. Post intervention, there is a 0% residual stenosis. Hemodynamic findings consistent with mitral valve regurgitation.  Recent Labs: 06/26/2020: NT-Pro BNP 137 08/28/2020: TSH 4.38 10/06/2020: ALT 38; BUN 19; Creatinine, Ser 1.13; Hemoglobin 14.3; Platelets 143; Potassium 4.5; Sodium 136  08/28/2020: Cholesterol 113; HDL 37.00; LDL Cholesterol 46; Total CHOL/HDL Ratio 3; Triglycerides 150.0; VLDL 30.0   CrCl cannot be calculated (Patient's most recent lab result is older than the maximum 21 days allowed.).   Wt Readings from Last 3 Encounters:  01/03/21 246 lb (111.6 kg)  10/10/20 240 lb 6 oz (109 kg)  10/06/20 239 lb (108.4 kg)     Other studies reviewed: Additional studies/records reviewed today include: summarized above  ASSESSMENT AND PLAN:  Persistent AFib CHA2DS2Vasc is 7, on Eliquis, appropriately dosed no burden by symptoms  CAD No symptoms of angina On statin, zetia, BB, no ASA w.Eliquis Strongly urged to get back to the gym/exercise, start slow and advance as able  Chronic CHF (combined) Improved LVEF by most recent echo He has some edema today, no SOB, lungs are clear Discussed minimizing sodium and using an extra 1/2 tab lasix for 2-3 days prn for edema, weightgain On BB, Entresto, diuretics  VHD S/p MitraClip Functioning well by recent echo C/w structural heart team  Disposition: labs today, F/u with Dr. Tamala Julian as scheduled, and EP In a year, sooner if needed  Current medicines are reviewed at length with the patient today.   The patient did not have any concerns regarding medicines.  Venetia Night, PA-C 02/17/2021 6:00 PM     [facility]  [place] [address] [place] [address] [phone] (office)  [phone] (fax)

## 2021-02-20 ENCOUNTER — Encounter: Payer: Self-pay | Admitting: Physician Assistant

## 2021-02-20 ENCOUNTER — Ambulatory Visit: Payer: Medicare (Managed Care) | Admitting: Physician Assistant

## 2021-02-20 ENCOUNTER — Ambulatory Visit: Payer: Medicare (Managed Care) | Admitting: Cardiology

## 2021-02-20 ENCOUNTER — Other Ambulatory Visit: Payer: Self-pay

## 2021-02-20 VITALS — BP 118/64 | HR 75 | Ht 68.0 in | Wt 244.4 lb

## 2021-02-20 DIAGNOSIS — I2510 Atherosclerotic heart disease of native coronary artery without angina pectoris: Secondary | ICD-10-CM | POA: Diagnosis not present

## 2021-02-20 DIAGNOSIS — I5043 Acute on chronic combined systolic (congestive) and diastolic (congestive) heart failure: Secondary | ICD-10-CM

## 2021-02-20 DIAGNOSIS — I4819 Other persistent atrial fibrillation: Secondary | ICD-10-CM

## 2021-02-20 DIAGNOSIS — Z79899 Other long term (current) drug therapy: Secondary | ICD-10-CM | POA: Diagnosis not present

## 2021-02-20 LAB — CBC
Hematocrit: 39.4 % (ref 37.5–51.0)
Hemoglobin: 13.4 g/dL (ref 13.0–17.7)
MCH: 29 pg (ref 26.6–33.0)
MCHC: 34 g/dL (ref 31.5–35.7)
MCV: 85 fL (ref 79–97)
Platelets: 163 10*3/uL (ref 150–450)
RBC: 4.62 x10E6/uL (ref 4.14–5.80)
RDW: 13.1 % (ref 11.6–15.4)
WBC: 8 10*3/uL (ref 3.4–10.8)

## 2021-02-20 LAB — BASIC METABOLIC PANEL
BUN/Creatinine Ratio: 19 (ref 10–24)
BUN: 20 mg/dL (ref 8–27)
CO2: 28 mmol/L (ref 20–29)
Calcium: 9.2 mg/dL (ref 8.6–10.2)
Chloride: 104 mmol/L (ref 96–106)
Creatinine, Ser: 1.08 mg/dL (ref 0.76–1.27)
Glucose: 103 mg/dL — ABNORMAL HIGH (ref 70–99)
Potassium: 4.5 mmol/L (ref 3.5–5.2)
Sodium: 142 mmol/L (ref 134–144)
eGFR: 70 mL/min/{1.73_m2} (ref >59)

## 2021-02-20 NOTE — Patient Instructions (Signed)
Medication Instructions:   Your physician recommends that you continue on your current medications as directed. Please refer to the Current Medication list given to you today.  *If you need a refill on your cardiac medications before your next appointment, please call your pharmacy*   Lab Work:  BMET  CBC TODAY    If you have labs (blood work) drawn today and your tests are completely normal, you will receive your results only by: [place] (if you have MyChart) OR A paper copy in the mail If you have any lab test that is abnormal or we need to change your treatment, we will call you to review the results.   Testing/Procedures: NONE ORDERED  TODAY    Follow-Up: At [facility], you and your health needs are our priority.  As part of our continuing mission to provide you with exceptional heart care, we have created designated Provider Care Teams.  These Care Teams include your primary Cardiologist (physician) and Advanced Practice Providers (APPs -  Physician Assistants and Nurse Practitioners) who all work together to provide you with the care you need, when you need it.  We recommend signing up for the patient portal called "MyChart".  Sign up information is provided on this After Visit Summary.  MyChart is used to connect with patients for Virtual Visits (Telemedicine).  Patients are able to view lab/test results, encounter notes, upcoming appointments, etc.  Non-urgent messages can be sent to your provider as well.   To learn more about what you can do with MyChart, go to [email or web address].    Your next appointment:   1 year(s)  The format for your next appointment:   In Person  Provider:   You may see Will Meredith [name], MD or one of the following Advanced Practice Providers on your designated Care Team:   Tommye Standard, [place] Legrand Como "Jonni Sanger" Chalmers Cater, [place]   Other Instructions

## 2021-02-21 ENCOUNTER — Other Ambulatory Visit: Payer: Self-pay | Admitting: Interventional Cardiology

## 2021-03-02 ENCOUNTER — Encounter: Payer: Self-pay | Admitting: Family Medicine

## 2021-03-02 ENCOUNTER — Ambulatory Visit: Payer: Medicare (Managed Care) | Admitting: Family Medicine

## 2021-03-02 VITALS — BP 128/60 | HR 74 | Temp 97.9°F | Resp 16 | Ht 68.0 in | Wt 246.2 lb

## 2021-03-02 DIAGNOSIS — D6869 Other thrombophilia: Secondary | ICD-10-CM

## 2021-03-02 DIAGNOSIS — D1721 Benign lipomatous neoplasm of skin and subcutaneous tissue of right arm: Secondary | ICD-10-CM

## 2021-03-02 DIAGNOSIS — F321 Major depressive disorder, single episode, moderate: Secondary | ICD-10-CM

## 2021-03-02 DIAGNOSIS — N62 Hypertrophy of breast: Secondary | ICD-10-CM | POA: Diagnosis not present

## 2021-03-02 DIAGNOSIS — E669 Obesity, unspecified: Secondary | ICD-10-CM

## 2021-03-02 DIAGNOSIS — I25709 Atherosclerosis of coronary artery bypass graft(s), unspecified, with unspecified angina pectoris: Secondary | ICD-10-CM

## 2021-03-02 DIAGNOSIS — E1169 Type 2 diabetes mellitus with other specified complication: Secondary | ICD-10-CM | POA: Diagnosis not present

## 2021-03-02 DIAGNOSIS — I771 Stricture of artery: Secondary | ICD-10-CM

## 2021-03-02 DIAGNOSIS — E039 Hypothyroidism, unspecified: Secondary | ICD-10-CM | POA: Diagnosis not present

## 2021-03-02 LAB — HEPATIC FUNCTION PANEL
ALT: 27 U/L (ref 0–53)
AST: 23 U/L (ref 0–37)
Albumin: 4.4 g/dL (ref 3.5–5.2)
Alkaline Phosphatase: 62 U/L (ref 39–117)
Bilirubin, Direct: 0.1 mg/dL (ref 0.0–0.3)
Total Bilirubin: 0.4 mg/dL (ref 0.2–1.2)
Total Protein: 6.5 g/dL (ref 6.0–8.3)

## 2021-03-02 LAB — LIPID PANEL
Cholesterol: 124 mg/dL (ref 0–200)
HDL: 40 mg/dL (ref >39.00)
NonHDL: 83.88
Total CHOL/HDL Ratio: 3
Triglycerides: 201 mg/dL — ABNORMAL HIGH (ref 0.0–149.0)
VLDL: 40.2 mg/dL — ABNORMAL HIGH (ref 0.0–40.0)

## 2021-03-02 LAB — LDL CHOLESTEROL, DIRECT: Direct LDL: 58 mg/dL

## 2021-03-02 LAB — HEMOGLOBIN A1C: Hgb A1c MFr Bld: 6.5 % (ref 4.6–6.5)

## 2021-03-02 NOTE — Patient Instructions (Addendum)
Give [place] 2-3 business days to get the results of your labs back.   Keep the diet clean and stay active.  Stay hydrated.   This could be due to the spironolactone causing breast pain/irritation. If anything changes let me know.   Let us know if you need anything.

## 2021-03-02 NOTE — Progress Notes (Signed)
Subjective:   Chief Complaint  Patient presents with   Follow-up    Here for follow up diabetes    Diabetes    Raymond Christensen is a 81 y.o. male here for follow-up of diabetes.  He is here with his wife. Cassie does not monitor his sugars at home currently.  Patient does not require insulin.   Medications include: diet controlled Diet is could be better.  Exercise: cycling, wt lifting No Cp or SOB.   Hypothyroidism Patient presents for follow-up of hypothyroidism.  Reports compliance with medication- Synthroid 75 mcg/d, Cytomel 5 mcg/d. Current symptoms include: denies fatigue, weight changes, heat/cold intolerance, bowel/skin changes or CVS symptoms Denies: denies fatigue, weight changes, heat/cold intolerance, bowel/skin changes or CVS symptoms He believes his dose should be unchanged  The patient has a history of a lipoma in his right upper extremity.  His wife is concerned that it is growing.  He does not think it is.  It is not causing him any pain of any pain and he denies any weakness/poor circulation in his upper extremity.  He does not wish to have a procedure done at this time.  He has had bilateral nipple pain over the past few weeks.  No injury or change in activity.  He has not noticed any discharge from the nipple, redness, cracking of the skin, rashes, or breast masses.  No personal or known family history of breast cancer.  Of note, he is on spironolactone.  Irritability The patient reports he is still very irritable.  He is following with a psychiatrist who has him on oxcarbazepine and Zoloft.  He states that he voices concern about irritability and the psychiatrist did nothing.  There is a question of memory issues with the patient, so his wife plans on going with him to his next appointment.  Past Medical History:  Diagnosis Date   Arthritis    CAD (coronary artery disease)    Carotid artery disease ([place])    s/p left CEA 07/18/08   Chronic systolic CHF  (congestive heart failure) ([place])    Concussion Summer 2012   Essential hypertension 02/26/2017   Heart disease    Hyperlipidemia    Hypothyroidism 10/15/2017   Major depressive disorder    Mitral valve regurgitation    Myocardial infarction ([place]) 1996, 2021   Obstructive sleep apnea on CPAP    Persistent atrial fibrillation (HCC)    Poor flexibility of tendon 12/01/2018   S/P mitral valve clip implantation 12/23/2019   s/p TEER with MitraClip with one XTW by Dr. Burt Knack   Subungual hematoma of digit of hand 12/01/2018   Subungual hematoma of right foot 12/01/2018   Thyroid disease    Type 2 diabetes mellitus without complication, without long-term current use of insulin ([place]) 02/26/2017     Related testing: Retinal exam: Done Pneumovax: done  Objective:  BP 128/60 (BP Location: Right Arm, Patient Position: Sitting, Cuff Size: Small)    Pulse 74    Temp 97.9 F (36.6 C) (Oral)    Resp 16    Ht 5\' 8"  (1.727 m)    Wt 246 lb 3.2 oz (111.7 kg)    SpO2 94%    BMI 37.43 kg/m  General:  Well developed, well nourished, in no apparent distress Skin:  Mild tenderness over the area around the nipples and over it.  There is no fluctuance, erythema, or excessive warmth compared to the surrounding area.  Nothing is able to be expressed from this area.  No nodules or masses appreciated. There is a large lipoma in the right upper extremity medially and proximally.  There is no tenderness to palpation or overlying skin changes. Head:  Normocephalic, atraumatic Eyes:  Pupils equal and round, sclera anicteric without injection  Lungs:  CTAB, no access msc use Cardio:  RRR, no bruits, no LE edema Musculoskeletal:  Symmetrical muscle groups noted without atrophy or deformity Neuro:  Sensation intact to pinprick on feet Psych: Age appropriate judgment and insight  Assessment:   Diabetes mellitus type 2 in obese ([place]) - Plan: Hepatic function panel, Lipid panel, Hemoglobin A1c  Hypothyroidism,  unspecified type  Lipoma of right upper extremity  Gynecomastia, male  Depression, major, single episode, moderate (HCC), Chronic  Subclavian artery stenosis, right (HCC), Chronic  Morbid obesity, unspecified obesity type ([place]), Chronic  Secondary hypercoagulable state ([place]), Chronic  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  Continue diet control.  Continue statin.  Check above.  Counseled on diet and exercise. Chronic, stable.  Continue levothyroxine 75 mcg daily, Cytomel 5 mcg daily. Chronic, stable.  Reassurance given.  Offered referral to the general surgery team to have it removed if he desires.  There is no medical reason to do this at this time. New issue.  Could reach out to the cardiology team to discuss possibility of gynecomastia causing this issue.  Offered mammogram/ultrasound with the patient that he strongly refused.  He could see how things go with supportive care such as ice, Tylenol, avoiding trauma. I would like his wife to attend his next appointment with him.  I want one of them to directly bring up concerns with continued agitation/irritability and see if there is an appropriate response.  If it does not, we may need to find him a new psychiatrist. F/u in 6 mo. The patient and his spouse voiced understanding and agreement to the plan.  I spent 45 minutes with the patient and his spouse discussing the plan detailed above in addition to reviewing his chart on the same day of the visit.  [place], DO 03/02/21 10:11 AM

## 2021-03-03 ENCOUNTER — Encounter: Payer: Self-pay | Admitting: Family Medicine

## 2021-03-05 ENCOUNTER — Other Ambulatory Visit: Payer: Self-pay | Admitting: Family Medicine

## 2021-03-05 DIAGNOSIS — E785 Hyperlipidemia, unspecified: Secondary | ICD-10-CM

## 2021-03-05 MED ORDER — FENOFIBRATE 48 MG PO TABS: 48.0000 mg | ORAL_TABLET | Freq: Every day | ORAL | 3 refills | Status: DC

## 2021-03-19 ENCOUNTER — Telehealth: Payer: Self-pay | Admitting: Family Medicine

## 2021-03-19 NOTE — Telephone Encounter (Signed)
Opened in error

## 2021-03-25 ENCOUNTER — Other Ambulatory Visit: Payer: Self-pay | Admitting: Interventional Cardiology

## 2021-03-25 DIAGNOSIS — R600 Localized edema: Secondary | ICD-10-CM

## 2021-03-28 ENCOUNTER — Other Ambulatory Visit: Payer: Self-pay

## 2021-03-28 DIAGNOSIS — R600 Localized edema: Secondary | ICD-10-CM

## 2021-03-28 MED ORDER — POTASSIUM CHLORIDE ER 10 MEQ PO TBCR
EXTENDED_RELEASE_TABLET | ORAL | 0 refills | Status: DC
Start: 2021-03-28 — End: 2021-04-17

## 2021-03-28 NOTE — Progress Notes (Signed)
Refill sent in for klor-con ?

## 2021-03-30 ENCOUNTER — Encounter: Payer: Self-pay | Admitting: Family Medicine

## 2021-03-30 LAB — HM DIABETES EYE EXAM

## 2021-04-10 ENCOUNTER — Telehealth: Payer: Self-pay | Admitting: Interventional Cardiology

## 2021-04-10 NOTE — Telephone Encounter (Signed)
? ?  Pre-operative Risk Assessment  ?  ?Patient Name: Raymond Christensen  ?DOB: 05/30/1926 ?MRN: [id number]  ? ?  ? ?Request for Surgical Clearance   ? ?Procedure:  Rezum  ?Date of Surgery:  Clearance TBD                              ?   ?Surgeon:  Dr. Junious Silk ?Surgeon's Group or Practice Name:  [facility] ?Phone number:  [phone] ?Fax number:  [phone] ?  ?Type of Clearance Requested:   ?- Medical  ?- Pharmacy:  Hold Apixaban (Eliquis) 3 days  ?  ?Type of Anesthesia:  Nitrous Oxide  ?  ?Additional requests/questions:   ? ?Signed, ?Johnna Acosta   ?04/10/2021, 10:21 AM   ?

## 2021-04-10 NOTE — Telephone Encounter (Signed)
Will route to pharm for anticoag then patient will need tele visit as medical clearance requested. Last OV 02/2021 with EP, 12/2020 with structural, and 08/2020 with general cardiologist with our team Dr. Tamala Julian. Also has a separate [facility] visit in from 10/2020. ?

## 2021-04-11 NOTE — Telephone Encounter (Signed)
Patient with diagnosis of afib on Eliquis for anticoagulation.   ? ?Procedure: rezum ?Date of procedure: TBD ? ?CHA2DS2-VASc Score = 6  ?This indicates a 9.7% annual risk of stroke. ?The patient's score is based upon: ?CHF History: 1 ?HTN History: 1 ?Diabetes History: 1 ?Stroke History: 0 ?Vascular Disease History: 1 ?Age Score: 2 ?Gender Score: 0 ?  ?CrCl 74m/min using adjusted body weight due to obesity ?Platelet count 163K ? ?Per office protocol, patient can hold Eliquis for 3 days prior to procedure as requested.   ?

## 2021-04-11 NOTE — Telephone Encounter (Signed)
Patient will need virtual televisit for clearance ?

## 2021-04-12 ENCOUNTER — Telehealth: Payer: Self-pay | Admitting: *Deleted

## 2021-04-12 ENCOUNTER — Ambulatory Visit (INDEPENDENT_AMBULATORY_CARE_PROVIDER_SITE_OTHER): Payer: Medicare (Managed Care) | Admitting: General Practice

## 2021-04-12 DIAGNOSIS — Z01810 Encounter for preprocedural cardiovascular examination: Secondary | ICD-10-CM

## 2021-04-12 NOTE — Telephone Encounter (Signed)
Pt would like to keep his appt in June with Dr. Tamala Julian as well ?

## 2021-04-12 NOTE — Telephone Encounter (Signed)
PT AGREEABLE TO PLAN OF CARE FOR TELE PRE OP APPT TODAY @ 4 PM. MED REC AND CONSENT ARE DONE.  ? ?  ?Patient Consent for Virtual Visit  ? ? ?   ? ?[name] has provided verbal consent on 04/12/2021 for a virtual visit (video or telephone). ? ? ?CONSENT FOR VIRTUAL VISIT FOR:  Raymond Christensen  ?By participating in this virtual visit I agree to the following: ? ?I hereby voluntarily request, consent and authorize [facility] and its employed or contracted physicians, physician assistants, nurse practitioners or other licensed health care professionals (the Practitioner), to provide me with telemedicine health care services (the ?Services") as deemed necessary by the treating Practitioner. I acknowledge and consent to receive the Services by the Practitioner via telemedicine. I understand that the telemedicine visit will involve communicating with the Practitioner through live audiovisual communication technology and the disclosure of certain medical information by electronic transmission. I acknowledge that I have been given the opportunity to request an in-person assessment or other available alternative prior to the telemedicine visit and am voluntarily participating in the telemedicine visit. ? ?I understand that I have the right to withhold or withdraw my consent to the use of telemedicine in the course of my care at any time, without affecting my right to future care or treatment, and that the Practitioner or I may terminate the telemedicine visit at any time. I understand that I have the right to inspect all information obtained and/or recorded in the course of the telemedicine visit and may receive copies of available information for a reasonable fee.  I understand that some of the potential risks of receiving the Services via telemedicine include:  ?Delay or interruption in medical evaluation due to technological equipment failure or disruption; ?Information transmitted may not be sufficient (e.g. poor  resolution of images) to allow for appropriate medical decision making by the Practitioner; and/or  ?In rare instances, security protocols could fail, causing a breach of personal health information. ? ?Furthermore, I acknowledge that it is my responsibility to provide information about my medical history, conditions and care that is complete and accurate to the best of my ability. I acknowledge that Practitioner's advice, recommendations, and/or decision may be based on factors not within their control, such as incomplete or inaccurate data provided by me or distortions of diagnostic images or specimens that may result from electronic transmissions. I understand that the practice of medicine is not an exact science and that Practitioner makes no warranties or guarantees regarding treatment outcomes. I acknowledge that a copy of this consent can be made available to me via my patient portal ([facility]), or I can request a printed copy by calling the office of [facility].   ? ?I understand that my insurance will be billed for this visit.  ? ?I have read or had this consent read to me. ?I understand the contents of this consent, which adequately explains the benefits and risks of the Services being provided via telemedicine.  ?I have been provided ample opportunity to ask questions regarding this consent and the Services and have had my questions answered to my satisfaction. ?I give my informed consent for the services to be provided through the use of telemedicine in my medical care ? ? ? ?

## 2021-04-12 NOTE — Progress Notes (Signed)
? ?Virtual Visit via Telephone Note  ? ?This visit type was conducted due to national recommendations for restrictions regarding the COVID-19 Pandemic (e.g. social distancing) in an effort to limit this patient's exposure and mitigate transmission in our community.  Due to his co-morbid illnesses, this patient is at least at moderate risk for complications without adequate follow up.  This format is felt to be most appropriate for this patient at this time.  The patient did not have access to video technology/had technical difficulties with video requiring transitioning to audio format only (telephone).  All issues noted in this document were discussed and addressed.  No physical exam could be performed with this format.  Please refer to the patient's chart for his  consent to telehealth for [facility]. ? ?Evaluation Performed:  Preoperative cardiovascular risk assessment ?_____________  ? ?Date:  04/12/2021  ? ?Patient ID:  Raymond Christensen, DOB [birth year], MRN [id number] ?Patient Location:  ?Home ?Provider location:   ?Office ? ?Primary Care Provider:  [name], DO ?Primary Cardiologist:  Sinclair Grooms, MD ? ?Chief Complaint  ?  ?81 y.o. y/o male with a h/o persistent atrial fibrillation, coronary artery disease, acute on chronic combined diastolic and systolic CHF, who is pending Rezum urology procedure, and presents today for telephonic preoperative cardiovascular risk assessment. ? ?Past Medical History  ?  ?Past Medical History:  ?Diagnosis Date  ? Arthritis   ? CAD (coronary artery disease)   ? Carotid artery disease ([place])   ? s/p left CEA 01/17/14  ? Chronic systolic CHF (congestive heart failure) ([place])   ? Concussion Summer 2012  ? Essential hypertension 02/26/2017  ? Heart disease   ? Hyperlipidemia   ? Hypothyroidism 10/15/2017  ? Major depressive disorder   ? Mitral valve regurgitation   ? Myocardial infarction [facility]) 1996, 2021  ? Obstructive sleep apnea on CPAP   ? Persistent atrial  fibrillation ([place])   ? Poor flexibility of tendon 12/01/2018  ? S/P mitral valve clip implantation 12/23/2019  ? s/p TEER with MitraClip with one XTW by Dr. Burt Knack  ? Subungual hematoma of digit of hand 12/01/2018  ? Subungual hematoma of right foot 12/01/2018  ? Thyroid disease   ? Type 2 diabetes mellitus without complication, without long-term current use of insulin ([place]) 02/26/2017  ? ?Past Surgical History:  ?Procedure Laterality Date  ? ATRIAL FIBRILLATION ABLATION N/A 02/01/2020  ? Procedure: ATRIAL FIBRILLATION ABLATION;  Surgeon: [name], MD;  Location: [place] CV LAB;  Service: Cardiovascular;  Laterality: N/A;  ? BYPASS GRAFT  2001  ? CARDIAC CATHETERIZATION    ? CARDIOVERSION N/A 11/08/2019  ? Procedure: CARDIOVERSION;  Surgeon: [name], MD;  Location: [facility]OSCOPY;  Service: Cardiovascular;  Laterality: N/A;  ? CAROTID ENDARTERECTOMY Left 01/17/2014  ? CORONARY ANGIOPLASTY    ? CORONARY ARTERY BYPASS GRAFT    ? MITRAL VALVE REPAIR N/A 12/23/2019  ? Procedure: MITRAL VALVE REPAIR;  Surgeon: [name], MD;  Location: [place] CV LAB;  Service: Cardiovascular;  Laterality: N/A;  ? RIGHT/LEFT HEART CATH AND CORONARY/GRAFT ANGIOGRAPHY N/A 09/09/2019  ? Procedure: RIGHT/LEFT HEART CATH AND CORONARY/GRAFT ANGIOGRAPHY;  Surgeon: [name], MD;  Location: [place] CV LAB;  Service: Cardiovascular;  Laterality: N/A;  ? TEE WITHOUT CARDIOVERSION N/A 09/13/2019  ? Procedure: TRANSESOPHAGEAL ECHOCARDIOGRAM (TEE);  Surgeon: [name], MD;  Location: [place];  Service: Cardiovascular;  Laterality: N/A;  ? TEE WITHOUT CARDIOVERSION N/A 12/23/2019  ? Procedure: TRANSESOPHAGEAL ECHOCARDIOGRAM (  TEE);  Surgeon: [name], MD;  Location: [place] CV LAB;  Service: Cardiovascular;  Laterality: N/A;  ? TEE WITHOUT CARDIOVERSION  02/01/2020  ? Procedure: TRANSESOPHAGEAL ECHOCARDIOGRAM (TEE);  Surgeon: [name], MD;  Location: [place] CV LAB;   Service: Cardiovascular;;  ? [place]  ? ? ?Allergies ? ?Allergies  ?Allergen Reactions  ? Sulfa Antibiotics Rash  ?  Questionable, he developed a diffuse rash 2 days after stopping Bactrim  ? ? ?History of Present Illness  ?  ?Raymond Christensen is a 81 y.o. male who presents via audio/video conferencing for a telehealth visit today.  Pt was last seen in cardiology clinic on 02/20/2021 by Tommye Standard PA-C.  At that time Raymond Christensen was doing well .  The patient is now pending Rezum urology procedure.  Since his last visit, he he remains stable from a cardiac standpoint. ? ?Today he denies chest pain, shortness of breath, lower extremity edema, fatigue, palpitations, melena, hematuria, hemoptysis, diaphoresis, weakness, presyncope, syncope, orthopnea, and PND. ? ?Home Medications  ?  ?Prior to Admission medications   ?Medication Sig Start Date End Date Taking? Authorizing Provider  ?acetaminophen (TYLENOL) 500 MG tablet Take 1,000 mg by mouth every 6 (six) hours as needed for moderate pain or headache.    [provider]  ?amoxicillin (AMOXIL) 500 MG tablet Take 4 tablets (2,000 mg total) one hour prior to all dental visits. 12/30/19   Eileen Stanford, PA-C  ?apixaban (ELIQUIS) 5 MG TABS tablet Take 1 tablet (5 mg total) by mouth 2 (two) times daily. 10/03/20   Belva Crome, MD  ?atorvastatin (LIPITOR) 80 MG tablet TAKE 1 TABLET DAILY 02/21/21   Belva Crome, MD  ?Cholecalciferol (VITAMIN D3) 250 MCG (10000 UT) capsule Take 10,000 Units by mouth daily.     [provider]  ?COVID-19 mRNA bivalent vaccine, Pfizer, injection Inject into the muscle. 10/10/20   Carlyle Basques, MD  ?ENTRESTO 24-26 MG TAKE 1 TABLET TWICE A DAY 08/29/20   Isaiah Serge, NP  ?ezetimibe (ZETIA) 10 MG tablet TAKE 1 TABLET DAILY 05/08/20   Belva Crome, MD  ?famotidine (PEPCID) 20 MG tablet Take 1 tablet (20 mg total) by mouth daily. 09/07/19   Saguier, Percell Miller, PA-C  ?fenofibrate (TRICOR) 48 MG  tablet Take 1 tablet (48 mg total) by mouth daily. 03/05/21   [name], DO  ?finasteride (PROSCAR) 5 MG tablet TAKE 1 TABLET DAILY 02/21/20   [name], DO  ?furosemide (LASIX) 40 MG tablet TAKE 1 TABLET DAILY 11/06/20   Isaiah Serge, NP  ?HYDROcodone-acetaminophen (NORCO/VICODIN) 5-325 MG tablet Take 1 tablet by mouth every 6 (six) hours as needed for moderate pain or severe pain. 10/06/20   Charlesetta Shanks, MD  ?influenza vaccine adjuvanted (FLUAD) 0.5 ML injection Inject into the muscle. 10/24/20   Carlyle Basques, MD  ?levocetirizine Harlow Ohms) 5 MG tablet TAKE 1 TABLET EVERY EVENING 01/05/21   [name], DO  ?levothyroxine (SYNTHROID) 75 MCG tablet TAKE 1 TABLET DAILY 12/04/20   [name], DO  ?liothyronine (CYTOMEL) 5 MCG tablet TAKE 2 TABLETS DAILY 10/16/20   [name], DO  ?metoprolol succinate (TOPROL-XL) 50 MG 24 hr tablet Take 1 tablet (50 mg total) by mouth daily. Take with or immediately following a meal. 08/14/20   Camnitz, Ocie Doyne, MD  ?Multiple Vitamins-Minerals (MULTIVITAMIN ADULT EXTRA C PO) Take 1 tablet by mouth daily.     [provider]  ?nitroGLYCERIN (NITROSTAT) 0.4 MG SL tablet Place 1 tablet (0.4 mg total) under the tongue every 5 (five) minutes as needed for chest pain. 09/14/19   Charlynne Cousins, MD  ?nortriptyline (PAMELOR) 25 MG capsule Take 1 capsule (25 mg total) by mouth at bedtime. 09/29/20   [name], DO  ?omega-3 acid ethyl esters (LOVAZA) 1 g capsule TAKE 2 CAPSULES TWICE DAILY ?Patient not taking: Reported on 04/12/2021 12/17/19   [name], DO  ?OXcarbazepine (TRILEPTAL) 150 MG tablet Take 150 mg by mouth 2 (two) times daily. 11/19/20   [provider]  ?potassium chloride (KLOR-CON) 10 MEQ tablet TAKE 2 TABLETS DAILY AS    NEEDED WITH EVERY '40MG'$  DOSEOF LASIX ?Patient taking differently: daily. TAKE 2 TABLETS DAILY 03/28/21   Belva Crome, MD  ?potassium chloride  (KLOR-CON) 10 MEQ tablet TAKE 2 TABLETS DAILY AS    NEEDED WITH EVERY '40MG'$  DOSEOF LASIX ?Patient not taking: Reported on 04/12/2021 03/28/21   Belva Crome, MD  ?sertraline (ZOLOFT) 100 MG tablet Take 100 mg by

## 2021-04-12 NOTE — Telephone Encounter (Signed)
PT AGREEABLE TO PLAN OF CARE FOR TELE PRE OP APPT TODAY @ 4 PM. MED REC AND CONSENT ARE DONE.  ?  ?

## 2021-04-16 ENCOUNTER — Other Ambulatory Visit (INDEPENDENT_AMBULATORY_CARE_PROVIDER_SITE_OTHER): Payer: Medicare (Managed Care)

## 2021-04-16 ENCOUNTER — Encounter: Payer: Self-pay | Admitting: Family Medicine

## 2021-04-16 DIAGNOSIS — E785 Hyperlipidemia, unspecified: Secondary | ICD-10-CM

## 2021-04-16 LAB — HEPATIC FUNCTION PANEL
ALT: 24 U/L (ref 0–53)
AST: 23 U/L (ref 0–37)
Albumin: 4.4 g/dL (ref 3.5–5.2)
Alkaline Phosphatase: 59 U/L (ref 39–117)
Bilirubin, Direct: 0.1 mg/dL (ref 0.0–0.3)
Total Bilirubin: 0.6 mg/dL (ref 0.2–1.2)
Total Protein: 6.5 g/dL (ref 6.0–8.3)

## 2021-04-16 LAB — LIPID PANEL
Cholesterol: 139 mg/dL (ref 0–200)
HDL: 42.2 mg/dL (ref >39.00)
NonHDL: 96.99
Total CHOL/HDL Ratio: 3
Triglycerides: 219 mg/dL — ABNORMAL HIGH (ref 0.0–149.0)
VLDL: 43.8 mg/dL — ABNORMAL HIGH (ref 0.0–40.0)

## 2021-04-16 LAB — LDL CHOLESTEROL, DIRECT: Direct LDL: 75 mg/dL

## 2021-04-17 ENCOUNTER — Other Ambulatory Visit: Payer: Self-pay | Admitting: Family Medicine

## 2021-04-17 DIAGNOSIS — E785 Hyperlipidemia, unspecified: Secondary | ICD-10-CM

## 2021-04-17 MED ORDER — FENOFIBRATE 145 MG PO TABS: 145.0000 mg | ORAL_TABLET | Freq: Every day | ORAL | 3 refills | Status: DC

## 2021-05-13 ENCOUNTER — Other Ambulatory Visit: Payer: Self-pay | Admitting: Interventional Cardiology

## 2021-06-01 ENCOUNTER — Other Ambulatory Visit (INDEPENDENT_AMBULATORY_CARE_PROVIDER_SITE_OTHER): Payer: Medicare (Managed Care)

## 2021-06-01 DIAGNOSIS — E785 Hyperlipidemia, unspecified: Secondary | ICD-10-CM

## 2021-06-01 LAB — LIPID PANEL
Cholesterol: 118 mg/dL (ref 0–200)
HDL: 39.8 mg/dL (ref >39.00)
LDL Cholesterol: 51 mg/dL (ref 0–99)
NonHDL: 78.16
Total CHOL/HDL Ratio: 3
Triglycerides: 138 mg/dL (ref 0.0–149.0)
VLDL: 27.6 mg/dL (ref 0.0–40.0)

## 2021-06-05 ENCOUNTER — Other Ambulatory Visit: Payer: Self-pay | Admitting: Family Medicine

## 2021-06-07 ENCOUNTER — Encounter: Payer: Self-pay | Admitting: Family Medicine

## 2021-06-14 ENCOUNTER — Ambulatory Visit: Payer: Medicare (Managed Care) | Attending: Internal Medicine

## 2021-06-14 DIAGNOSIS — Z23 Encounter for immunization: Secondary | ICD-10-CM

## 2021-06-15 NOTE — Progress Notes (Signed)
   Covid-19 Vaccination Clinic  Name:  Raymond Christensen    MRN: [id number] DOB: Sep 16, 1940  06/15/2021  Raymond Christensen was observed post Covid-19 immunization for 15 minutes without incident. He was provided with Vaccine Information Sheet and instruction to access the V-Safe system.   Raymond Christensen was instructed to call 911 with any severe reactions post vaccine: Difficulty breathing  Swelling of face and throat  A fast heartbeat  A bad rash all over body  Dizziness and weakness   Immunizations Administered     Name Date Dose VIS Date Route   Pfizer Covid-19 Vaccine Bivalent Booster 06/14/2021  1:44 PM 0.3 mL 09/13/2020 Intramuscular   Manufacturer: [place]   Lot: [id number]   [place]: [id number]

## 2021-06-18 ENCOUNTER — Other Ambulatory Visit (HOSPITAL_BASED_OUTPATIENT_CLINIC_OR_DEPARTMENT_OTHER): Payer: Self-pay

## 2021-06-18 MED ORDER — PFIZER COVID-19 VAC BIVALENT 30 MCG/0.3ML IM SUSP
INTRAMUSCULAR | 0 refills | Status: DC
  Filled 2021-06-18: qty 0.3, 1d supply, fill #0

## 2021-06-24 NOTE — Progress Notes (Unsigned)
Cardiology Office Note:    Date:  06/25/2021   ID:  Zahi Plaskett, DOB 1940/06/04, MRN [id number]  PCP:  [name], DO  Cardiologist:  Sinclair Grooms, MD   Referring MD: [name]*   Chief Complaint  Patient presents with   Coronary Artery Disease   Congestive Heart Failure   Hypertension   Hyperlipidemia    History of Present Illness:    Raymond Christensen is a 81 y.o. male with a hx of  new AFib, elevated troponin and new reduced Ef 35-40% 08/2019, and mod to severe MR, LA thrombus on TEE. Recent PCI with stent SVG to Cfx. Past history of MI in 1994 c/b cardiac arrest s/p lytics and PTCA, CABG in 2001 (LIMA to LAD, SVG to OM-2, SVG to right PDA, SVG to D-2) with post-op AF, carotid stenosis s/p L CEA and right subclavian stenosis, diet-controlled DM, HTN, obesity, OSA on CPAP, mild cognitive impairment, and depression/mood disorder. H/O MitraClip 12/2019 and AF ablation 01/2020. Recent nuisance bleeding on Eliquis/Plavix with Plavix DC 08/2020.   There is confusion about whether he is taking metoprolol succinate.  Angina.  He denies orthopnea and PND.  He has not had syncope.  No lower extremity edema.  He has not needed sublingual nitroglycerin.  As usual, he wants to know which medications he can discontinue.  We discussed that LV is now up in the lower limit of normal range for male.  If we alter metoprolol/spironolactone/Entresto we could lose heart failure compensation.  He understands this.  He needs to be on anticoagulation therapy until or unless Dr. Curt Bears feels that it can be discontinued.    Past Medical History:  Diagnosis Date   Arthritis    CAD (coronary artery disease)    Carotid artery disease ([place])    s/p left CEA 09/20/26   Chronic systolic CHF (congestive heart failure) ([place])    Concussion Summer 2012   Essential hypertension 02/26/2017   Heart disease    Hyperlipidemia    Hypothyroidism 10/15/2017   Major depressive disorder     Mitral valve regurgitation    Myocardial infarction ([place]) 1996, 2021   Obstructive sleep apnea on CPAP    Persistent atrial fibrillation ([facility])    Poor flexibility of tendon 12/01/2018   S/P mitral valve clip implantation 12/23/2019   s/p TEER with MitraClip with one XTW by Dr. Burt Knack   Subungual hematoma of digit of hand 12/01/2018   Subungual hematoma of right foot 12/01/2018   Thyroid disease    Type 2 diabetes mellitus without complication, without long-term current use of insulin ([place]) 02/26/2017    Past Surgical History:  Procedure Laterality Date   ATRIAL FIBRILLATION ABLATION N/A 02/01/2020   Procedure: ATRIAL FIBRILLATION ABLATION;  Surgeon: [name], MD;  Location: [facility];  Service: Cardiovascular;  Laterality: N/A;   BYPASS GRAFT  2001   CARDIAC CATHETERIZATION     CARDIOVERSION N/A 11/08/2019   Procedure: CARDIOVERSION;  Surgeon: [name], MD;  Location: [facility];  Service: Cardiovascular;  Laterality: N/A;   CAROTID ENDARTERECTOMY Left 01/17/2014   CORONARY ANGIOPLASTY     CORONARY ARTERY BYPASS GRAFT     MITRAL VALVE REPAIR N/A 12/23/2019   Procedure: MITRAL VALVE REPAIR;  Surgeon: [name], MD;  Location: [facility];  Service: Cardiovascular;  Laterality: N/A;   RIGHT/LEFT HEART CATH AND CORONARY/GRAFT ANGIOGRAPHY N/A 09/09/2019   Procedure: RIGHT/LEFT HEART CATH AND CORONARY/GRAFT ANGIOGRAPHY;  Surgeon: Gwenlyn Found,  Pearletha Forge, MD;  Location: [facility];  Service: Cardiovascular;  Laterality: N/A;   TEE WITHOUT CARDIOVERSION N/A 09/13/2019   Procedure: TRANSESOPHAGEAL ECHOCARDIOGRAM (TEE);  Surgeon: [name], MD;  Location: [facility];  Service: Cardiovascular;  Laterality: N/A;   TEE WITHOUT CARDIOVERSION N/A 12/23/2019   Procedure: TRANSESOPHAGEAL ECHOCARDIOGRAM (TEE);  Surgeon: [name], MD;  Location: [facility];  Service: Cardiovascular;  Laterality: N/A;   TEE WITHOUT CARDIOVERSION   02/01/2020   Procedure: TRANSESOPHAGEAL ECHOCARDIOGRAM (TEE);  Surgeon: [name], MD;  Location: [facility];  Service: Cardiovascular;;   TOTAL HIP ARTHROPLASTY  1994, 1995    Current Medications: Current Meds  Medication Sig   acetaminophen (TYLENOL) 500 MG tablet Take 1,000 mg by mouth every 6 (six) hours as needed for moderate pain or headache.   amoxicillin (AMOXIL) 500 MG tablet Take 4 tablets (2,000 mg total) one hour prior to all dental visits.   apixaban (ELIQUIS) 5 MG TABS tablet Take 1 tablet (5 mg total) by mouth 2 (two) times daily.   ARIPiprazole (ABILIFY) 5 MG tablet Take 5 mg by mouth daily.   atorvastatin (LIPITOR) 80 MG tablet TAKE 1 TABLET DAILY   Cholecalciferol (VITAMIN D3) 250 MCG (10000 UT) capsule Take 10,000 Units by mouth daily.    COVID-19 mRNA bivalent vaccine, Pfizer, (PFIZER COVID-19 VAC BIVALENT) injection Inject into the muscle.   COVID-19 mRNA bivalent vaccine, Pfizer, injection Inject into the muscle.   ENTRESTO 24-26 MG TAKE 1 TABLET TWICE A DAY   ezetimibe (ZETIA) 10 MG tablet TAKE 1 TABLET DAILY   famotidine (PEPCID) 20 MG tablet Take 1 tablet (20 mg total) by mouth daily.   fenofibrate (TRICOR) 145 MG tablet Take 1 tablet (145 mg total) by mouth daily.   furosemide (LASIX) 40 MG tablet TAKE 1 TABLET DAILY   HYDROcodone-acetaminophen (NORCO/VICODIN) 5-325 MG tablet Take 1 tablet by mouth every 6 (six) hours as needed for moderate pain or severe pain.   influenza vaccine adjuvanted (FLUAD) 0.5 ML injection Inject into the muscle.   levocetirizine (XYZAL) 5 MG tablet TAKE 1 TABLET EVERY EVENING   levothyroxine (SYNTHROID) 75 MCG tablet TAKE 1 TABLET DAILY   liothyronine (CYTOMEL) 5 MCG tablet TAKE 2 TABLETS DAILY   metoprolol succinate (TOPROL-XL) 50 MG 24 hr tablet Take 1 tablet (50 mg total) by mouth daily. Take with or immediately following a meal.   Multiple Vitamins-Minerals (MULTIVITAMIN ADULT EXTRA C PO) Take 1 tablet by mouth  daily.    nitroGLYCERIN (NITROSTAT) 0.4 MG SL tablet Place 1 tablet (0.4 mg total) under the tongue every 5 (five) minutes as needed for chest pain.   OXcarbazepine (TRILEPTAL) 150 MG tablet Take 150 mg by mouth 2 (two) times daily.   potassium chloride (KLOR-CON) 10 MEQ tablet TAKE 2 TABLETS DAILY AS    NEEDED WITH EVERY '40MG'$  DOSEOF LASIX (Patient taking differently: daily. TAKE 2 TABLETS DAILY)   sertraline (ZOLOFT) 100 MG tablet Take 100 mg by mouth daily.   spironolactone (ALDACTONE) 25 MG tablet TAKE 1/2 TABLET DAILY   tamsulosin (FLOMAX) 0.4 MG CAPS capsule TAKE 1 CAPSULE DAILY (Patient taking differently: Take 0.8 mg by mouth daily.)   vitamin B-12 (CYANOCOBALAMIN) 1000 MCG tablet Take 1,000 mcg by mouth daily.   zinc gluconate 50 MG tablet Take 50 mg by mouth daily.     Allergies:   Sulfa antibiotics   Social History   Socioeconomic History   Marital status: Married  Spouse name: Not on file   Number of children: 2   Years of education: 26   Highest education level: Doctorate  Occupational History   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  Tobacco Use   Smoking status: Former    Types: Cigarettes    Quit date: 1980    Years since quitting: 43.4   Smokeless tobacco: Never  Vaping Use   Vaping Use: Never used  Substance and Sexual Activity   Alcohol use: Yes    Alcohol/week: 4.0 standard drinks of alcohol    Types: 1 Cans of beer, 3 Standard drinks or equivalent per week    Comment: 1 drink 3 times per week   Drug use: No   Sexual activity: Yes  Other Topics Concern   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 There is no history of Cancer.  ROS:   Please see the history of present illness.    Denies orthopnea.  Denies neurological symptoms.  Urological procedure to improve urine flow.  All other systems reviewed and are negative.  EKGs/Labs/Other Studies Reviewed:    The following studies were reviewed today: 2 D Doppler ECHOCARDIOGRAM 01/03/2021: IMPRESSIONS     1. Left ventricular ejection fraction, by estimation, is 50 to 55%. The  left ventricle has low normal function. The left ventricle has no regional  wall motion abnormalities. Left ventricular diastolic parameters are  indeterminate.   2. Right ventricular systolic function is normal. The right ventricular  size is mildly enlarged. There is normal pulmonary artery systolic  pressure.   3. Left atrial size was mild to moderately dilated.   4. The mitral valve has been repaired/replaced. Mild mitral valve  regurgitation. The mean mitral valve gradient is 2.0 mmHg. There is a  Mitra-Clip with one XTW present in the mitral position. Procedure Date:  12/23/2019.   5. The aortic valve is grossly normal. There is mild calcification of the  aortic valve. There is mild thickening of the aortic valve. Aortic valve  regurgitation is trivial. Aortic valve sclerosis is present, with no  evidence of aortic valve stenosis.   6. The inferior vena cava is normal in size with <50% respiratory  variability, suggesting right atrial pressure of 8 mmHg.   Comparison(s): Prior images reviewed side by side. Changes from prior  study are noted. EF slightly improved on current study.   EKG:  EKG the most recent electrocardiogram performed October 10, 2020 demonstrates this rhythm with first-degree AV block.  Recent Labs: 06/26/2020: NT-Pro  BNP 137 08/28/2020: TSH 4.38 02/20/2021: BUN 20; Creatinine, Ser 1.08; Hemoglobin 13.4; Platelets 163; Potassium 4.5; Sodium 142 04/16/2021: ALT 24  Recent Lipid Panel    Component Value Date/Time   CHOL 118 06/01/2021 0919   CHOL 110 04/24/2020 1006   TRIG 138.0 06/01/2021 0919   HDL 39.80 06/01/2021  0919   HDL 42 04/24/2020 1006   CHOLHDL 3 06/01/2021 0919   VLDL 27.6 06/01/2021 0919   LDLCALC 51 06/01/2021 0919   LDLCALC 37 04/24/2020 1006   LDLDIRECT 75.0 04/16/2021 0907    Physical Exam:    VS:  BP (!) 98/56   Pulse 76   Ht '5\' 8"'$  (1.727 m)   Wt 241 lb 6.4 oz (109.5 kg)   SpO2 98%   BMI 36.70 kg/m     Wt Readings from Last 3 Encounters:  06/25/21 241 lb 6.4 oz (109.5 kg)  03/02/21 246 lb 3.2 oz (111.7 kg)  02/20/21 244 lb 6.4 oz (110.9 kg)     GEN: Obese. No acute distress HEENT: Normal NECK: No JVD. LYMPHATICS: No lymphadenopathy CARDIAC: Faint 1/6 apical systolic murmur. RRR no gallop, or edema. VASCULAR:  Normal Pulses. No bruits. RESPIRATORY:  Clear to auscultation without rales, wheezing or rhonchi  ABDOMEN: Soft, non-tender, non-distended, No pulsatile mass, MUSCULOSKELETAL: No deformity  SKIN: Warm and dry NEUROLOGIC:  Alert and oriented x 3 PSYCHIATRIC:  Normal affect   ASSESSMENT:    1. Chronic systolic CHF (congestive heart failure) ([place])   2. Coronary artery disease involving coronary bypass graft of native heart with unstable angina pectoris ([place])   3. S/P mitral valve clip implantation   4. Persistent atrial fibrillation ([place])   5. Morbid obesity, unspecified obesity type ([place])   6. Essential hypertension    PLAN:    In order of problems listed above:  The patient is not taking metoprolol.Marland Kitchen  He will call us and let us know.  Continue spironolactone, and Entresto. Continue aggressive lipid management. No significant clinical mitral regurgitation. Status post ablation with most recent EKG demonstrating sinus rhythm. Continues to be a  significant risk factor. Due to heart failure therapy, blood pressure is low normal.  Overall education and awareness concerning secondary risk prevention was discussed in detail: LDL less than 70, hemoglobin A1c less than 7, blood pressure target less than 130/80 mmHg, >150 minutes of moderate aerobic activity per week, avoidance of smoking, weight control (via diet and exercise), and continued surveillance/management of/for obstructive sleep apnea.  Clinic follow-up with me in 1 year.  He is seeing Dr. Sharlet Salina at [facility] and myself for cardiology follow-up.  EP physician here in [place] is Dr. Curt Bears.  Structural heart is Dr. Burt Knack.    Medication Adjustments/Labs and Tests Ordered: Current medicines are reviewed at length with the patient today.  Concerns regarding medicines are outlined above.  No orders of the defined types were placed in this encounter.  No orders of the defined types were placed in this encounter.   Patient Instructions  Medication Instructions:  Your physician recommends that you continue on your current medications as directed. Please refer to the Current Medication list given to you today.  *If you need a refill on your cardiac medications before your next appointment, please call your pharmacy*  **Please call our office at [phone] to let us know if you are taking Metoprolol succinate.**  Lab Work: NONE  Testing/Procedures: NONE  Follow-Up: At [facility], you and your health needs are our priority.  As part of our continuing mission to provide you with exceptional heart care, we have created designated Provider Care Teams.  These Care Teams include your primary Cardiologist (physician) and Advanced Practice Providers (APPs -  Physician Assistants and Nurse Practitioners) who all work together to provide you with the care you need, when you need  it.  Your next appointment:   1 year(s)  The format for your next  appointment:   In Person  Provider:   Sinclair Grooms, MD {  Important Information About Sugar         Signed, Sinclair Grooms, MD  06/25/2021 9:30 AM    [place]

## 2021-06-25 ENCOUNTER — Encounter: Payer: Self-pay | Admitting: Interventional Cardiology

## 2021-06-25 ENCOUNTER — Telehealth: Payer: Self-pay | Admitting: Interventional Cardiology

## 2021-06-25 ENCOUNTER — Ambulatory Visit: Payer: Medicare (Managed Care) | Admitting: Interventional Cardiology

## 2021-06-25 VITALS — BP 98/56 | HR 76 | Ht 68.0 in | Wt 241.4 lb

## 2021-06-25 DIAGNOSIS — I4819 Other persistent atrial fibrillation: Secondary | ICD-10-CM

## 2021-06-25 DIAGNOSIS — I5022 Chronic systolic (congestive) heart failure: Secondary | ICD-10-CM | POA: Diagnosis not present

## 2021-06-25 DIAGNOSIS — Z95818 Presence of other cardiac implants and grafts: Secondary | ICD-10-CM

## 2021-06-25 DIAGNOSIS — Z9889 Other specified postprocedural states: Secondary | ICD-10-CM

## 2021-06-25 DIAGNOSIS — I25700 Atherosclerosis of coronary artery bypass graft(s), unspecified, with unstable angina pectoris: Secondary | ICD-10-CM

## 2021-06-25 DIAGNOSIS — I10 Essential (primary) hypertension: Secondary | ICD-10-CM

## 2021-06-25 NOTE — Telephone Encounter (Signed)
Pt c/o medication issue:  1. Name of Medication: metoprolol succinate (TOPROL-XL) 50 MG 24 hr tablet  2. How are you currently taking this medication (dosage and times per day)? Take 1 tablet (50 mg total) by mouth daily. Take with or immediately following a meal.  3. Are you having a reaction (difficulty breathing--STAT)? no  4. What is your medication issue? Patient states he is not taking the medication

## 2021-06-25 NOTE — Patient Instructions (Signed)
Medication Instructions:  Your physician recommends that you continue on your current medications as directed. Please refer to the Current Medication list given to you today.  *If you need a refill on your cardiac medications before your next appointment, please call your pharmacy*  **Please call our office at [phone] to let us know if you are taking Metoprolol succinate.**  Lab Work: NONE  Testing/Procedures: NONE  Follow-Up: At [facility], you and your health needs are our priority.  As part of our continuing mission to provide you with exceptional heart care, we have created designated Provider Care Teams.  These Care Teams include your primary Cardiologist (physician) and Advanced Practice Providers (APPs -  Physician Assistants and Nurse Practitioners) who all work together to provide you with the care you need, when you need it.  Your next appointment:   1 year(s)  The format for your next appointment:   In Person  Provider:   Sinclair Grooms, MD {  Important Information About Sugar

## 2021-06-25 NOTE — Telephone Encounter (Signed)
See MyChart message

## 2021-07-11 ENCOUNTER — Other Ambulatory Visit: Payer: Self-pay | Admitting: Family Medicine

## 2021-07-14 ENCOUNTER — Other Ambulatory Visit: Payer: Self-pay | Admitting: Family Medicine

## 2021-07-26 ENCOUNTER — Ambulatory Visit: Payer: Medicare (Managed Care)

## 2021-07-27 ENCOUNTER — Ambulatory Visit (INDEPENDENT_AMBULATORY_CARE_PROVIDER_SITE_OTHER): Payer: Medicare (Managed Care)

## 2021-07-27 VITALS — BP 121/74 | HR 66 | Temp 98.3°F | Resp 16 | Ht 68.0 in | Wt 245.2 lb

## 2021-07-27 DIAGNOSIS — Z0000 Encounter for general adult medical examination without abnormal findings: Secondary | ICD-10-CM

## 2021-07-27 NOTE — Progress Notes (Signed)
Subjective:   Randen Kauth is a 81 y.o. male who presents for Medicare Annual/Subsequent preventive examination.  Review of Systems     Cardiac Risk Factors include: advanced age (>57mn, >>35women);obesity (BMI >30kg/m2);hypertension;dyslipidemia;male gender     Objective:    Today's Vitals   07/27/21 1025  BP: 121/74  Pulse: 66  Resp: 16  Temp: 98.3 F (36.8 C)  SpO2: 98%  Weight: 245 lb 3.2 oz (111.2 kg)  Height: '5\' 8"'$  (1.727 m)   Body mass index is 37.28 kg/m.     07/27/2021   10:22 AM 10/06/2020    9:50 AM 07/24/2020    3:05 PM 02/01/2020    5:40 AM 12/21/2019    3:03 PM 12/21/2019    1:34 PM 11/08/2019   10:29 AM  Advanced Directives  Does Patient Have a Medical Advance Directive? Yes No Yes Yes Yes Yes Yes  Type of AParamedicof [place] will  HSav[place]Living will [place]ananLiving will [place]CallimontLiving will HCrosslakeLiving will H[place] Does patient want to make changes to medical advance directive? No - Patient declined   No - Patient declined  No - Patient declined   Copy of H[place] Chart? Yes - validated most recent copy scanned in chart (See row information)  Yes - validated most recent copy scanned in chart (See row information) No - copy requested Yes - validated most recent copy scanned in chart (See row information) Yes - validated most recent copy scanned in chart (See row information) No - copy requested  Would patient like information on creating a medical advance directive?  No - Patient declined         Current Medications (verified) Outpatient Encounter Medications as of 07/27/2021  Medication Sig   acetaminophen (TYLENOL) 500 MG tablet Take 1,000 mg by mouth every 6 (six) hours as needed for moderate pain or headache.   amoxicillin (AMOXIL) 500 MG tablet Take 4 tablets (2,000 mg total) one hour prior  to all dental visits.   apixaban (ELIQUIS) 5 MG TABS tablet Take 1 tablet (5 mg total) by mouth 2 (two) times daily.   ARIPiprazole (ABILIFY) 5 MG tablet Take 5 mg by mouth daily.   atorvastatin (LIPITOR) 80 MG tablet TAKE 1 TABLET DAILY   Cholecalciferol (VITAMIN D3) 250 MCG (10000 UT) capsule Take 10,000 Units by mouth daily.    ENTRESTO 24-26 MG TAKE 1 TABLET TWICE A DAY   ezetimibe (ZETIA) 10 MG tablet TAKE 1 TABLET DAILY   famotidine (PEPCID) 20 MG tablet Take 1 tablet (20 mg total) by mouth daily.   fenofibrate (TRICOR) 145 MG tablet TAKE ONE TABLET BY MOUTH DAILY   furosemide (LASIX) 40 MG tablet TAKE 1 TABLET DAILY   HYDROcodone-acetaminophen (NORCO/VICODIN) 5-325 MG tablet Take 1 tablet by mouth every 6 (six) hours as needed for moderate pain or severe pain.   levocetirizine (XYZAL) 5 MG tablet TAKE 1 TABLET EVERY EVENING   levothyroxine (SYNTHROID) 75 MCG tablet TAKE 1 TABLET DAILY   liothyronine (CYTOMEL) 5 MCG tablet TAKE 2 TABLETS DAILY   Multiple Vitamins-Minerals (MULTIVITAMIN ADULT EXTRA C PO) Take 1 tablet by mouth daily.    nitroGLYCERIN (NITROSTAT) 0.4 MG SL tablet Place 1 tablet (0.4 mg total) under the tongue every 5 (five) minutes as needed for chest pain.   OXcarbazepine (TRILEPTAL) 150 MG tablet Take 150 mg by mouth 2 (two) times  daily.   potassium chloride (KLOR-CON) 10 MEQ tablet TAKE 2 TABLETS DAILY AS    NEEDED WITH EVERY '40MG'$  DOSEOF LASIX (Patient taking differently: daily. TAKE 2 TABLETS DAILY)   sertraline (ZOLOFT) 100 MG tablet Take 100 mg by mouth daily.   spironolactone (ALDACTONE) 25 MG tablet TAKE 1/2 TABLET DAILY   vitamin B-12 (CYANOCOBALAMIN) 1000 MCG tablet Take 1,000 mcg by mouth daily.   zinc gluconate 50 MG tablet Take 50 mg by mouth daily.   [DISCONTINUED] COVID-19 mRNA bivalent vaccine, Pfizer, (PFIZER COVID-19 VAC BIVALENT) injection Inject into the muscle.   [DISCONTINUED] COVID-19 mRNA bivalent vaccine, Pfizer, injection Inject into the muscle.    [DISCONTINUED] influenza vaccine adjuvanted (FLUAD) 0.5 ML injection Inject into the muscle.   [DISCONTINUED] tamsulosin (FLOMAX) 0.4 MG CAPS capsule TAKE 1 CAPSULE DAILY (Patient taking differently: Take 0.8 mg by mouth daily.)   No facility-administered encounter medications on file as of 07/27/2021.    Allergies (verified) Sulfa antibiotics   History: Past Medical History:  Diagnosis Date   Arthritis    CAD (coronary artery disease)    Carotid artery disease ([place])    s/p left CEA 03/18/57   Chronic systolic CHF (congestive heart failure) ([place])    Concussion Summer 2012   Essential hypertension 02/26/2017   Heart disease    Hyperlipidemia    Hypothyroidism 10/15/2017   Major depressive disorder    Mitral valve regurgitation    Myocardial infarction ([place]) 1996, 2021   Obstructive sleep apnea on CPAP    Persistent atrial fibrillation ([facility])    Poor flexibility of tendon 12/01/2018   S/P mitral valve clip implantation 12/23/2019   s/p TEER with MitraClip with one XTW by Dr. Burt Knack   Subungual hematoma of digit of hand 12/01/2018   Subungual hematoma of right foot 12/01/2018   Thyroid disease    Type 2 diabetes mellitus without complication, without long-term current use of insulin ([place]) 02/26/2017   Past Surgical History:  Procedure Laterality Date   ATRIAL FIBRILLATION ABLATION N/A 02/01/2020   Procedure: ATRIAL FIBRILLATION ABLATION;  Surgeon: [name], MD;  Location: [facility];  Service: Cardiovascular;  Laterality: N/A;   BYPASS GRAFT  2001   CARDIAC CATHETERIZATION     CARDIOVERSION N/A 11/08/2019   Procedure: CARDIOVERSION;  Surgeon: [name], MD;  Location: [facility];  Service: Cardiovascular;  Laterality: N/A;   CAROTID ENDARTERECTOMY Left 01/17/2014   CORONARY ANGIOPLASTY     CORONARY ARTERY BYPASS GRAFT     MITRAL VALVE REPAIR N/A 12/23/2019   Procedure: MITRAL VALVE REPAIR;  Surgeon: [name], MD;  Location: [facility];   Service: Cardiovascular;  Laterality: N/A;   RIGHT/LEFT HEART CATH AND CORONARY/GRAFT ANGIOGRAPHY N/A 09/09/2019   Procedure: RIGHT/LEFT HEART CATH AND CORONARY/GRAFT ANGIOGRAPHY;  Surgeon: [name], MD;  Location: [facility];  Service: Cardiovascular;  Laterality: N/A;   TEE WITHOUT CARDIOVERSION N/A 09/13/2019   Procedure: TRANSESOPHAGEAL ECHOCARDIOGRAM (TEE);  Surgeon: [name], MD;  Location: [facility];  Service: Cardiovascular;  Laterality: N/A;   TEE WITHOUT CARDIOVERSION N/A 12/23/2019   Procedure: TRANSESOPHAGEAL ECHOCARDIOGRAM (TEE);  Surgeon: [name], MD;  Location: [facility];  Service: Cardiovascular;  Laterality: N/A;   TEE WITHOUT CARDIOVERSION  02/01/2020   Procedure: TRANSESOPHAGEAL ECHOCARDIOGRAM (TEE);  Surgeon: [name], MD;  Location: [facility];  Service: Cardiovascular;;   TOTAL HIP ARTHROPLASTY  1994, 1995   Family History  Problem Relation Age of  Onset   Dementia Father        Concerns surrounding Lewy body dementia, but never officially diagnosed   Cancer Neg Hx    Social History   Socioeconomic History   Marital status: Married    Spouse name: Not on file   Number of children: 2   Years of education: 1   Highest education level: Doctorate  Occupational History   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  Tobacco Use   Smoking status: Former    Types: Cigarettes    Quit date: 1980    Years since quitting: 43.5   Smokeless tobacco: Never  Vaping Use   Vaping Use: Never used  Substance and Sexual Activity   Alcohol use: Yes    Alcohol/week: 4.0 standard drinks of alcohol    Types: 1 Cans of beer, 3 Standard drinks or equivalent per week    Comment: 1 drink 3 times per week   Drug use: No   Sexual activity: Yes  Other Topics Concern   Not on file  Social History Narrative   Pt is R handed   Lives in single story home with his wife, Arbie Cookey   Has 2 adult children   PhD in Biology   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 [employer]    Social Determinants of Health   Financial Resource Strain: [place]  (07/27/2021)   Overall Financial Resource Strain (CARDIA)    Difficulty of Paying Living Expenses: Not hard at all  Food Insecurity: No Food Insecurity (07/27/2021)   Hunger Vital Sign    Worried About Running Out of Food in the Last Year: Never true    [place] in the Last Year: Never true  Transportation Needs: No Transportation Needs (07/27/2021)   PRAPARE - Hydrologist (Medical): No    Lack of Transportation (Non-Medical): No  Physical Activity: Sufficiently Active (07/27/2021)   Exercise Vital Sign    Days of Exercise per Week: 7 days    Minutes of Exercise per Session: 30 min  Stress: No Stress Concern Present (07/27/2021)   [place]    Feeling of Stress : Not at all  Social Connections: [place] (07/27/2021)   Social Connection and Isolation Panel [NHANES]    Frequency of Communication with Friends and Family: More than three times a week    Frequency of Social Gatherings with Friends and Family: More than three times a week    Attends Religious Services: More than 4 times per year    Active Member of [employer] or Organizations: Yes    Attends Music therapist: More than 4 times per year    Marital Status: Married    Tobacco Counseling Counseling given: Not Answered   Clinical Intake:  Pre-visit preparation completed: Yes  Pain : No/denies pain     BMI - recorded: 37.28 Nutritional Status: BMI > 30  Obese Nutritional Risks: None Diabetes: No CBG done?: No Did pt. bring in CBG monitor from home?: No  How often do you need to have someone help you when you read instructions, pamphlets, or other written materials from your doctor or pharmacy?: 1 - Never  Diabetic?no pre-diabetic Nutrition Risk Assessment:  Has the patient had any N/V/D within  the last 2 months?  No  Does the patient have any non-healing wounds?  No  Has the patient had any unintentional weight loss or weight gain?  No   Diabetes:  Is the patient diabetic?  Yes  If diabetic, was a CBG obtained today?  No  Did the patient bring in their glucometer from home?  No  How often do you monitor your CBG's? No.   Financial Strains and Diabetes Management:  Are you having any financial strains with the device, your supplies or your medication? No .  Does the patient want to be seen by Chronic Care Management for management of their diabetes?  No  Would the patient like to be referred to a Nutritionist or for Diabetic Management?  No   Diabetic Exams:  Diabetic Eye Exam: Completed 03/30/21 Diabetic Foot Exam: Completed 08/28/20    Interpreter Needed?: No  Information entered by :: [place] of Daily Living    07/27/2021   10:29 AM  In your present state of health, do you have any difficulty performing the following activities:  Hearing? 1  Comment hearing aid  Vision? 0  Difficulty concentrating or making decisions? 0  Walking or climbing stairs? 0  Dressing or bathing? 0  Doing errands, shopping? 0  Preparing Food and eating ? N  Using the Toilet? N  In the past six months, have you accidently leaked urine? Y  Do you have problems with loss of bowel control? N  Managing your Medications? N  Managing your Finances? N  Housekeeping or managing your Housekeeping? N    Patient Care Team: [name], DO as PCP - General (Family Medicine) Belva Crome, MD as PCP - Cardiology (Cardiology) [name], MD as PCP - Electrophysiology (Cardiology) Cameron Sprang, MD as Consulting Physician (Neurology)  Indicate any recent Medical Services you may have received from other than [facility] providers in the past year (date may be approximate).     Assessment:   This is a routine wellness examination for  [place].  Hearing/Vision screen No results found.  Dietary issues and exercise activities discussed: Current Exercise Habits: Home exercise routine, Type of exercise: walking;stretching, Time (Minutes): 30, Frequency (Times/Week): 7, Weekly Exercise (Minutes/Week): 210, Intensity: Mild, Exercise limited by: None identified   Goals Addressed             This Visit's Progress    Increase physical activity   On track    Patient Stated   On track    Drink more water       Depression Screen    07/27/2021   10:23 AM 03/02/2021    9:18 AM 07/24/2020    3:07 PM 06/23/2020    1:47 PM 04/18/2020    3:47 PM 04/18/2020    3:18 PM 12/17/2019   12:42 PM  PHQ 2/9 Scores  PHQ - 2 Score 1 2 0 0 0 0 0    Fall Risk    07/27/2021   10:23 AM 03/02/2021    9:18 AM 07/24/2020    3:06 PM 04/18/2020    2:14 PM 10/21/2019    1:05 PM  [place] in the past year? 0 1 1 0 0  Number falls in past yr: 0 0 1 0 0  Injury with Fall? 0 0 0 0 0  Risk for fall due to : No Fall Risks  History of fall(s) Impaired balance/gait Other (Comment)  Risk for fall due to: Comment     Pt obtained 2.37 sconds on balance test  Follow up Falls evaluation completed  Falls prevention discussed Falls evaluation completed Falls evaluation completed    FALL  RISK PREVENTION PERTAINING TO THE HOME:  Any stairs in or around the home? No  If so, are there any without handrails?  N/a Home free of loose throw rugs in walkways, pet beds, electrical cords, etc? Yes  Adequate lighting in your home to reduce risk of falls? Yes   ASSISTIVE DEVICES UTILIZED TO PREVENT FALLS:  Life alert? Yes  Use of a cane, walker or w/c? No  Grab bars in the bathroom? No  Shower chair or bench in shower? Yes  Elevated toilet seat or a handicapped toilet? Yes   TIMED UP AND GO:  Was the test performed? Yes .  Length of time to ambulate 10 feet: 10 sec.   Gait steady and fast without use of assistive device  Cognitive Function:     01/12/2018    3:20 PM  MMSE - Mini Mental State Exam  Orientation to time 5  Orientation to Place 5  Registration 3  Attention/ Calculation 5  Recall 2  Language- name 2 objects 2  Language- repeat 1  Language- follow 3 step command 3  Language- read & follow direction 1  Write a sentence 1  Copy design 1  Total score 29      03/19/2018   10:00 AM  Montreal Cognitive Assessment   Visuospatial/ Executive (0/5) 5  Naming (0/3) 3  Attention: Read list of digits (0/2) 2  Attention: Read list of letters (0/1) 0  Attention: Serial 7 subtraction starting at 100 (0/3) 2  Language: Repeat phrase (0/2) 2  Language : Fluency (0/1) 1  Abstraction (0/2) 2  Delayed Recall (0/5) 0  Orientation (0/6) 6  Total 23      07/27/2021   10:33 AM  6CIT Screen  What Year? 0 points  What month? 0 points  What time? 0 points  Count back from 20 0 points  Months in reverse 0 points  Repeat phrase 2 points  Total Score 2 points    Immunizations Immunization History  Administered Date(s) Administered   Fluad Quad(high Dose 65+) 10/24/2020   Influenza, High Dose Seasonal PF 10/04/2019   Influenza-Unspecified 10/26/2017, 09/15/2018   PFIZER(Purple Top)SARS-COV-2 Vaccination 01/24/2019, 02/14/2019, 10/14/2019, 04/25/2020   Pfizer Covid-19 Vaccine Bivalent Booster 36yr & up 10/10/2020, 06/14/2021   Pneumococcal Conjugate-13 03/28/2009   Pneumococcal Polysaccharide-23 11/22/2002, 03/28/2009, 01/02/2015   Tdap 02/09/2018, 07/14/2020   Zoster Recombinat (Shingrix) 06/12/2016, 08/26/2016   Zoster, Live 11/29/2008    TDAP status: Up to date  Flu Vaccine status: Up to date  Pneumococcal vaccine status: Up to date  Covid-19 vaccine status: Completed vaccines  Qualifies for Shingles Vaccine? Yes   Zostavax completed No   Shingrix Completed?: Yes  Screening Tests Health Maintenance  Topic Date Due   Hepatitis C Screening  08/30/2021 (Originally 10/02/1958)   INFLUENZA VACCINE   08/14/2021   FOOT EXAM  08/28/2021   HEMOGLOBIN A1C  08/30/2021   OPHTHALMOLOGY EXAM  03/31/2022   Pneumonia Vaccine 80 Years old  Completed   COVID-19 Vaccine  Completed   Zoster Vaccines- Shingrix  Completed   HPV VACCINES  Aged Out   TETANUS/TDAP  Discontinued    Health Maintenance  There are no preventive care reminders to display for this patient.  Colorectal cancer screening: No longer required.   Lung Cancer Screening: (Low Dose CT Chest recommended if Age 746-80years, 30 pack-year currently smoking OR have quit w/in 15years.) does not qualify.   Lung Cancer Screening Referral: N/A  Additional Screening:  Hepatitis C  Screening: does not qualify; Completed aged out  Vision Screening: Recommended annual ophthalmology exams for early detection of glaucoma and other disorders of the eye. Is the patient up to date with their annual eye exam?  Yes  Who is the provider or what is the name of the office in which the patient attends annual eye exams? [facility]. If pt is not established with a provider, would they like to be referred to a provider to establish care? No .   Dental Screening: Recommended annual dental exams for proper oral hygiene  Community Resource Referral / Chronic Care Management: CRR required this visit?  No   CCM required this visit?  No      Plan:     I have personally reviewed and noted the following in the patient's chart:   Medical and social history Use of alcohol, tobacco or illicit drugs  Current medications and supplements including opioid prescriptions. Patient is currently taking opioid prescriptions. Information provided to patient regarding non-opioid alternatives. Patient advised to discuss non-opioid treatment plan with their provider. Functional ability and status Nutritional status Physical activity Advanced directives List of other physicians Hospitalizations, surgeries, and ER visits in previous 12  months Vitals Screenings to include cognitive, depression, and falls Referrals and appointments  In addition, I have reviewed and discussed with patient certain preventive protocols, quality metrics, and best practice recommendations. A written personalized care plan for preventive services as well as general preventive health recommendations were provided to patient.     Duard Brady Jamecia Lerman, Spr[place]   07/27/2021   Nurse Notes: none

## 2021-07-27 NOTE — Patient Instructions (Signed)
[name] , Thank you for taking time to come for your Medicare Wellness Visit. I appreciate your ongoing commitment to your health goals. Please review the following plan we discussed and let me know if I can assist you in the future.   Screening recommendations/referrals: Colonoscopy: no longer needed Recommended yearly ophthalmology/optometry visit for glaucoma screening and checkup Recommended yearly dental visit for hygiene and checkup  Vaccinations: Influenza vaccine: up to date Pneumococcal vaccine: up to date Tdap vaccine: up to date Shingles vaccine: up to date   Covid-19: completed  Advanced directives: yes, on file  Conditions/risks identified: see problem list   Next appointment: Follow up in one year for your annual wellness visit. 07/31/22  Preventive Care 80 Years and Older, Male Preventive care refers to lifestyle choices and visits with your health care provider that can promote health and wellness. What does preventive care include? A yearly physical exam. This is also called an annual well check. Dental exams once or twice a year. Routine eye exams. Ask your health care provider how often you should have your eyes checked. Personal lifestyle choices, including: Daily care of your teeth and gums. Regular physical activity. Eating a healthy diet. Avoiding tobacco and drug use. Limiting alcohol use. Practicing safe sex. Taking low doses of aspirin every day. Taking vitamin and mineral supplements as recommended by your health care provider. What happens during an annual well check? The services and screenings done by your health care provider during your annual well check will depend on your age, overall health, lifestyle risk factors, and family history of disease. Counseling  Your health care provider may ask you questions about your: Alcohol use. Tobacco use. Drug use. Emotional well-being. Home and relationship well-being. Sexual activity. Eating  habits. History of falls. Memory and ability to understand (cognition). Work and work Statistician. Screening  You may have the following tests or measurements: Height, weight, and BMI. Blood pressure. Lipid and cholesterol levels. These may be checked every 5 years, or more frequently if you are over 17 years old. Skin check. Lung cancer screening. You may have this screening every year starting at age 29 if you have a 30-pack-year history of smoking and currently smoke or have quit within the past 15 years. Fecal occult blood test (FOBT) of the stool. You may have this test every year starting at age 34. Flexible sigmoidoscopy or colonoscopy. You may have a sigmoidoscopy every 5 years or a colonoscopy every 10 years starting at age 43. Prostate cancer screening. Recommendations will vary depending on your family history and other risks. Hepatitis C blood test. Hepatitis B blood test. Sexually transmitted disease (STD) testing. Diabetes screening. This is done by checking your blood sugar (glucose) after you have not eaten for a while (fasting). You may have this done every 1-3 years. Abdominal aortic aneurysm (AAA) screening. You may need this if you are a current or former smoker. Osteoporosis. You may be screened starting at age 42 if you are at high risk. Talk with your health care provider about your test results, treatment options, and if necessary, the need for more tests. Vaccines  Your health care provider may recommend certain vaccines, such as: Influenza vaccine. This is recommended every year. Tetanus, diphtheria, and acellular pertussis (Tdap, Td) vaccine. You may need a Td booster every 10 years. Zoster vaccine. You may need this after age 51. Pneumococcal 13-valent conjugate (PCV13) vaccine. One dose is recommended after age 20. Pneumococcal polysaccharide (PPSV23) vaccine. One dose is recommended  after age 66. Talk to your health care provider about which screenings and  vaccines you need and how often you need them. This information is not intended to replace advice given to you by your health care provider. Make sure you discuss any questions you have with your health care provider. Document Released: 01/27/2015 Document Revised: 09/20/2015 Document Reviewed: 11/01/2014 Elsevier Interactive Patient Education  2017 [place] Prevention in the Home Falls can cause injuries. They can happen to people of all ages. There are many things you can do to make your home safe and to help prevent falls. What can I do on the outside of my home? Regularly fix the edges of walkways and driveways and fix any cracks. Remove anything that might make you trip as you walk through a door, such as a raised step or threshold. Trim any bushes or trees on the path to your home. Use bright outdoor lighting. Clear any walking paths of anything that might make someone trip, such as rocks or tools. Regularly check to see if handrails are loose or broken. Make sure that both sides of any steps have handrails. Any raised decks and porches should have guardrails on the edges. Have any leaves, snow, or ice cleared regularly. Use sand or salt on walking paths during winter. Clean up any spills in your garage right away. This includes oil or grease spills. What can I do in the bathroom? Use night lights. Install grab bars by the toilet and in the tub and shower. Do not use towel bars as grab bars. Use non-skid mats or decals in the tub or shower. If you need to sit down in the shower, use a plastic, non-slip stool. Keep the floor dry. Clean up any water that spills on the floor as soon as it happens. Remove soap buildup in the tub or shower regularly. Attach bath mats securely with double-sided non-slip rug tape. Do not have throw rugs and other things on the floor that can make you trip. What can I do in the bedroom? Use night lights. Make sure that you have a light by your  bed that is easy to reach. Do not use any sheets or blankets that are too big for your bed. They should not hang down onto the floor. Have a firm chair that has side arms. You can use this for support while you get dressed. Do not have throw rugs and other things on the floor that can make you trip. What can I do in the kitchen? Clean up any spills right away. Avoid walking on wet floors. Keep items that you use a lot in easy-to-reach places. If you need to reach something above you, use a strong step stool that has a grab bar. Keep electrical cords out of the way. Do not use floor polish or wax that makes floors slippery. If you must use wax, use non-skid floor wax. Do not have throw rugs and other things on the floor that can make you trip. What can I do with my stairs? Do not leave any items on the stairs. Make sure that there are handrails on both sides of the stairs and use them. Fix handrails that are broken or loose. Make sure that handrails are as long as the stairways. Check any carpeting to make sure that it is firmly attached to the stairs. Fix any carpet that is loose or worn. Avoid having throw rugs at the top or bottom of the stairs. If you  do have throw rugs, attach them to the floor with carpet tape. Make sure that you have a light switch at the top of the stairs and the bottom of the stairs. If you do not have them, ask someone to add them for you. What else can I do to help prevent falls? Wear shoes that: Do not have high heels. Have rubber bottoms. Are comfortable and fit you well. Are closed at the toe. Do not wear sandals. If you use a stepladder: Make sure that it is fully opened. Do not climb a closed stepladder. Make sure that both sides of the stepladder are locked into place. Ask someone to hold it for you, if possible. Clearly mark and make sure that you can see: Any grab bars or handrails. First and last steps. Where the edge of each step is. Use tools that  help you move around (mobility aids) if they are needed. These include: Canes. Walkers. Scooters. Crutches. Turn on the lights when you go into a dark area. Replace any light bulbs as soon as they burn out. Set up your furniture so you have a clear path. Avoid moving your furniture around. If any of your floors are uneven, fix them. If there are any pets around you, be aware of where they are. Review your medicines with your doctor. Some medicines can make you feel dizzy. This can increase your chance of falling. Ask your doctor what other things that you can do to help prevent falls. This information is not intended to replace advice given to you by your health care provider. Make sure you discuss any questions you have with your health care provider. Document Released: 10/27/2008 Document Revised: 06/08/2015 Document Reviewed: 02/04/2014 Elsevier Interactive Patient Education  2017 [employer].

## 2021-08-31 ENCOUNTER — Encounter: Payer: Self-pay | Admitting: Family Medicine

## 2021-08-31 ENCOUNTER — Ambulatory Visit: Payer: Medicare (Managed Care) | Admitting: Family Medicine

## 2021-08-31 VITALS — BP 120/62 | HR 76 | Temp 98.2°F | Resp 18 | Ht 68.0 in | Wt 247.8 lb

## 2021-08-31 DIAGNOSIS — E669 Obesity, unspecified: Secondary | ICD-10-CM | POA: Diagnosis not present

## 2021-08-31 DIAGNOSIS — E785 Hyperlipidemia, unspecified: Secondary | ICD-10-CM | POA: Diagnosis not present

## 2021-08-31 DIAGNOSIS — E1169 Type 2 diabetes mellitus with other specified complication: Secondary | ICD-10-CM | POA: Diagnosis not present

## 2021-08-31 DIAGNOSIS — E039 Hypothyroidism, unspecified: Secondary | ICD-10-CM | POA: Diagnosis not present

## 2021-08-31 LAB — COMPREHENSIVE METABOLIC PANEL
ALT: 31 U/L (ref 0–53)
AST: 26 U/L (ref 0–37)
Albumin: 4.3 g/dL (ref 3.5–5.2)
Alkaline Phosphatase: 63 U/L (ref 39–117)
BUN: 26 mg/dL — ABNORMAL HIGH (ref 6–23)
CO2: 28 mEq/L (ref 19–32)
Calcium: 9.2 mg/dL (ref 8.4–10.5)
Chloride: 103 mEq/L (ref 96–112)
Creatinine, Ser: 1.1 mg/dL (ref 0.40–1.50)
GFR: 63.67 mL/min (ref >60.00)
Glucose, Bld: 100 mg/dL — ABNORMAL HIGH (ref 70–99)
Potassium: 4.6 mEq/L (ref 3.5–5.1)
Sodium: 141 mEq/L (ref 135–145)
Total Bilirubin: 0.6 mg/dL (ref 0.2–1.2)
Total Protein: 6.5 g/dL (ref 6.0–8.3)

## 2021-08-31 LAB — LIPID PANEL
Cholesterol: 128 mg/dL (ref 0–200)
HDL: 35.9 mg/dL — ABNORMAL LOW (ref >39.00)
NonHDL: 92.44
Total CHOL/HDL Ratio: 4
Triglycerides: 221 mg/dL — ABNORMAL HIGH (ref 0.0–149.0)
VLDL: 44.2 mg/dL — ABNORMAL HIGH (ref 0.0–40.0)

## 2021-08-31 LAB — LDL CHOLESTEROL, DIRECT: Direct LDL: 65 mg/dL

## 2021-08-31 LAB — MICROALBUMIN / CREATININE URINE RATIO
Creatinine,U: 182.5 mg/dL
Microalb Creat Ratio: 2.8 mg/g (ref 0.0–30.0)
Microalb, Ur: 5 mg/dL — ABNORMAL HIGH (ref 0.0–1.9)

## 2021-08-31 LAB — TSH: TSH: 1.89 u[IU]/mL (ref 0.35–5.50)

## 2021-08-31 LAB — HEMOGLOBIN A1C: Hgb A1c MFr Bld: 6.4 % (ref 4.6–6.5)

## 2021-08-31 MED ORDER — OZEMPIC (0.25 OR 0.5 MG/DOSE) 2 MG/1.5ML ~~LOC~~ SOPN: PEN_INJECTOR | SUBCUTANEOUS | 1 refills | Status: DC

## 2021-08-31 MED ORDER — NITROGLYCERIN 0.4 MG SL SUBL
0.4000 mg | SUBLINGUAL_TABLET | SUBLINGUAL | 1 refills | Status: DC | PRN
Start: 2021-08-31 — End: 2022-02-25

## 2021-08-31 NOTE — Progress Notes (Signed)
Subjective:   Chief Complaint  Patient presents with   Follow-up    6 month    Raymond Christensen is a 81 y.o. male here for follow-up of diabetes.  He is here with his wife. Rob does not check his sugars routinely.  Patient does not require insulin.   Medications include: diet controlled Diet is fair.  Exercise: none He and his wife are inquiring about a weekly shot to help her lose weight.  He has never been on any medication for his diabetes before.  Mixed Hyperlipidemia Patient presents for mixed hyperlipidemia follow up. Currently being treated with Tricor 145 mg/d, Lipitor 80 mg/d and compliance with treatment thus far has been good. He denies myalgias. Diet/exercise as above The patient is known to have coexisting coronary artery disease. No CP or SOB.  Hypothyroidism Patient presents for follow-up of hypothyroidism.  Reports compliance with medications- levothyroxine 75 mcg/d, Cytomel 10 mcg daily. Current symptoms include: denies fatigue, weight changes, heat/cold intolerance, bowel/skin changes or CVS symptoms He believes his dose should be not significantly changed  He is also wondering about any vaccinations he may need including RSV vaccine and any updated COVID vaccines.  Past Medical History:  Diagnosis Date   Arthritis    CAD (coronary artery disease)    Carotid artery disease ([place])    s/p left CEA 07/20/14   Chronic systolic CHF (congestive heart failure) ([place])    Concussion Summer 2012   Essential hypertension 02/26/2017   Heart disease    Hyperlipidemia    Hypothyroidism 10/15/2017   Major depressive disorder    Mitral valve regurgitation    Myocardial infarction ([place]) 1996, 2021   Obstructive sleep apnea on CPAP    Persistent atrial fibrillation ([facility])    Poor flexibility of tendon 12/01/2018   S/P mitral valve clip implantation 12/23/2019   s/p TEER with MitraClip with one XTW by Dr. Burt Knack   Subungual hematoma of digit of hand 12/01/2018    Subungual hematoma of right foot 12/01/2018   Thyroid disease    Type 2 diabetes mellitus without complication, without long-term current use of insulin ([name]) 02/26/2017     Related testing: Retinal exam: Done Pneumovax: done  Objective:  BP 120/62 (BP Location: Left Arm, Cuff Size: Large)   Pulse 76   Temp 98.2 F (36.8 C)   Resp 18   Ht '5\' 8"'$  (1.727 m)   Wt 247 lb 12.8 oz (112.4 kg)   SpO2 98%   BMI 37.68 kg/m  General:  Well developed, well nourished, in no apparent distress Skin: No external lesions noted on the feet.  There is callus formation medially over the forefoot and great toe bilaterally; warm, no pallor or diaphoresis Lungs:  CTAB, no access msc use Cardio:  RRR, no bruits, no LE edema Musculoskeletal:  Symmetrical muscle groups noted without atrophy or deformity Neuro:  Sensation intact to pinprick on feet bilaterally Psych: Age appropriate judgment and insight  Assessment:   Diabetes mellitus type 2 in obese ([name]) - Plan: Hemoglobin A1c, Lipid panel, Comprehensive metabolic panel, Microalbumin / creatinine urine ratio, Semaglutide,0.25 or 0.'5MG'$ /DOS, (OZEMPIC, 0.25 OR 0.5 MG/DOSE,) 2 MG/1.5ML SOPN  Hyperlipidemia, unspecified hyperlipidemia type  Hypothyroidism, unspecified type - Plan: TSH   Plan:   Chronic, presumably stable.  Add Ozempic 0.25 mg weekly for 4 weeks and then increase to 0.5 mg weekly.  Counseled on diet and exercise. Chronic, hopefully stable.  Continue Lipitor 80 mg daily, Tricor 145 mg daily. Chronic, stable.  Continue Cytomel 10 mcg daily, levothyroxine 75 mcg daily. I do recommend RSV vaccine and told him to hold off on the [place] vaccination until the next option becomes available. Flu shot recommended for mid October. He would prefer to avoid hep C screening and ages out next month anyways. F/u in 6 mo. The patient and his spouse voiced understanding and agreement to the plan.  I spent 45 minutes with the patient discussing the above  plans in addition to reviewing his chart on the same day of the visit.  [place], DO 08/31/21 10:10 AM

## 2021-08-31 NOTE — Patient Instructions (Signed)
Give [place] 2-3 business days to get the results of your labs back.   Keep the diet clean and stay active.  Let me know if there are cost issues with the

## 2021-09-03 ENCOUNTER — Other Ambulatory Visit: Payer: Self-pay | Admitting: Family Medicine

## 2021-09-03 ENCOUNTER — Telehealth: Payer: Self-pay | Admitting: Family Medicine

## 2021-09-03 ENCOUNTER — Encounter: Payer: Self-pay | Admitting: Family Medicine

## 2021-09-03 ENCOUNTER — Telehealth (INDEPENDENT_AMBULATORY_CARE_PROVIDER_SITE_OTHER): Payer: Medicare (Managed Care) | Admitting: Family Medicine

## 2021-09-03 DIAGNOSIS — J209 Acute bronchitis, unspecified: Secondary | ICD-10-CM | POA: Diagnosis not present

## 2021-09-03 DIAGNOSIS — E785 Hyperlipidemia, unspecified: Secondary | ICD-10-CM

## 2021-09-03 MED ORDER — PREDNISONE 20 MG PO TABS: 40.0000 mg | ORAL_TABLET | Freq: Every day | ORAL | 0 refills | Status: AC

## 2021-09-03 MED ORDER — BENZONATATE 100 MG PO CAPS: 100.0000 mg | ORAL_CAPSULE | Freq: Three times a day (TID) | ORAL | 0 refills | Status: DC | PRN

## 2021-09-03 NOTE — Progress Notes (Signed)
CC: URI complaints  Raymond Christensen here for URI complaints. Due to COVID-19 pandemic, we are interacting via telephone. I verified patient's ID using 2 identifiers. Patient agreed to proceed with visit via this method. Patient is at home, I am at office. Patient and I are present for visit.   Duration: 2 days  Associated symptoms: sinus congestion, rhinorrhea, wheezing, and coughing Denies: sinus pain, itchy watery eyes, ear pain, ear drainage, sore throat, shortness of breath, myalgia, N/V, loss of taste/smell and fevers Treatment to date: none Sick contacts: No Tested - for covid today  Past Medical History:  Diagnosis Date   Arthritis    CAD (coronary artery disease)    Carotid artery disease ([place])    s/p left CEA 05/18/63   Chronic systolic CHF (congestive heart failure) ([place])    Concussion Summer 2012   Essential hypertension 02/26/2017   Heart disease    Hyperlipidemia    Hypothyroidism 10/15/2017   Major depressive disorder    Mitral valve regurgitation    Myocardial infarction ([place]) 1996, 2021   Obstructive sleep apnea on CPAP    Persistent atrial fibrillation ([facility])    Poor flexibility of tendon 12/01/2018   S/P mitral valve clip implantation 12/23/2019   s/p TEER with MitraClip with one XTW by Dr. Burt Knack   Subungual hematoma of digit of hand 12/01/2018   Subungual hematoma of right foot 12/01/2018   Thyroid disease    Type 2 diabetes mellitus without complication, without long-term current use of insulin ([place]) 02/26/2017    Objective No conversational dyspnea Age appropriate judgment and insight Nml affect and mood  Acute wheezy bronchitis - Plan: benzonatate (TESSALON) 100 MG capsule, predniSONE (DELTASONE) 20 MG tablet  Well controlled DM, will do 5 d pred burst 40 mg/d. Tessalon Perles prn. Send message if no improvement.  Continue to push fluids, practice good hand hygiene, cover mouth when coughing. F/u prn. If starting to experience fevers, shaking, or  shortness of breath, seek immediate care. Total time: 11 min Pt voiced understanding and agreement to the plan.  [place], DO 09/03/21 9:44 AM

## 2021-09-03 NOTE — Telephone Encounter (Signed)
What time today

## 2021-09-03 NOTE — Telephone Encounter (Signed)
Patient called to request appt because he has a cold. Patient took covid test and it was negative. Offered patient appt with Dr. Nani Ravens tomorrow morning, but patient said he wants virtual visit today. Checked all providers in the office and advised that we will not have anything until tomorrow. Patient requested call from Dr. Irene Limbo nurse.

## 2021-09-04 ENCOUNTER — Encounter: Payer: Self-pay | Admitting: Family Medicine

## 2021-09-04 ENCOUNTER — Telehealth: Payer: Self-pay

## 2021-09-04 MED ORDER — MOLNUPIRAVIR EUA 200MG CAPSULE: 4.0000 | ORAL_CAPSULE | Freq: Two times a day (BID) | ORAL | 0 refills | Status: AC

## 2021-09-04 NOTE — Telephone Encounter (Signed)
Sent telephone message to PCP regarding patient requesting anti viral.

## 2021-09-04 NOTE — Telephone Encounter (Signed)
Patient called back to request a antiviral for him and his wife as a preventative measure since she is likely to catch covid as well. They both use:    [facility] [id number]  [place], [place] [address]  Phone:  [phone]  Fax:  [phone]   Please call patient back to advise.

## 2021-09-04 NOTE — Telephone Encounter (Signed)
Caller Name [place] Phone Number [phone] Patient Name Raymond Christensen Patient DOB 07/16/1940 Call Type Message Only Information Provided Reason for Call Request for General Office Information Initial Comment Caller states he is requesting to speak with Robin. Additional Comment Office hours provided. Disp. Time Disposition Final User 09/04/2021 8:04:45 AM General Information Provided Yes Uvaldo Rising Call Closed By: Uvaldo Rising Transaction Date/Time: 09/04/2021 8:03:07 AM (ET)

## 2021-09-04 NOTE — Telephone Encounter (Signed)
Pt's wife called, Arbie Cookey- states they have been trying to call the office since 8am (nothing documented other than after hours call), wife concerned because they have normally heard back from Dr. Nani Ravens by now. I informed her that I see where we received his mychart message at 10:54am this morning but no other messages. Informed I'd make PCP aware that Pt has tested positive this morning for Covid.

## 2021-09-13 ENCOUNTER — Other Ambulatory Visit: Payer: Self-pay | Admitting: Interventional Cardiology

## 2021-09-13 ENCOUNTER — Encounter: Payer: Self-pay | Admitting: Family Medicine

## 2021-09-13 ENCOUNTER — Other Ambulatory Visit: Payer: Self-pay | Admitting: Cardiology

## 2021-09-14 ENCOUNTER — Other Ambulatory Visit: Payer: Self-pay

## 2021-09-14 MED ORDER — SPIRONOLACTONE 25 MG PO TABS: 12.5000 mg | ORAL_TABLET | Freq: Every day | ORAL | 2 refills | Status: DC

## 2021-09-14 NOTE — Telephone Encounter (Signed)
Prescription refill request for Eliquis received. Indication:Afib Last office visit:6/23 Scr:1.1 Age: 81 Weight:112.4 kg  Prescription refilled

## 2021-09-18 ENCOUNTER — Other Ambulatory Visit: Payer: Self-pay | Admitting: Family Medicine

## 2021-09-18 DIAGNOSIS — J209 Acute bronchitis, unspecified: Secondary | ICD-10-CM

## 2021-09-19 ENCOUNTER — Encounter: Payer: Self-pay | Admitting: Family Medicine

## 2021-09-19 MED ORDER — BENZONATATE 100 MG PO CAPS: 100.0000 mg | ORAL_CAPSULE | Freq: Three times a day (TID) | ORAL | 0 refills | Status: DC | PRN

## 2021-09-28 ENCOUNTER — Ambulatory Visit: Payer: Medicare (Managed Care) | Admitting: Podiatry

## 2021-09-28 ENCOUNTER — Encounter: Payer: Self-pay | Admitting: Podiatry

## 2021-09-28 DIAGNOSIS — E1149 Type 2 diabetes mellitus with other diabetic neurological complication: Secondary | ICD-10-CM

## 2021-09-28 DIAGNOSIS — M79675 Pain in left toe(s): Secondary | ICD-10-CM

## 2021-09-28 DIAGNOSIS — E1140 Type 2 diabetes mellitus with diabetic neuropathy, unspecified: Secondary | ICD-10-CM

## 2021-09-28 DIAGNOSIS — M79674 Pain in right toe(s): Secondary | ICD-10-CM | POA: Diagnosis not present

## 2021-09-28 DIAGNOSIS — D689 Coagulation defect, unspecified: Secondary | ICD-10-CM

## 2021-09-28 DIAGNOSIS — B351 Tinea unguium: Secondary | ICD-10-CM | POA: Diagnosis not present

## 2021-09-28 DIAGNOSIS — L84 Corns and callosities: Secondary | ICD-10-CM

## 2021-09-30 ENCOUNTER — Emergency Department (HOSPITAL_COMMUNITY): Payer: Medicare (Managed Care)

## 2021-09-30 ENCOUNTER — Encounter (HOSPITAL_COMMUNITY): Payer: Self-pay | Admitting: Emergency Medicine

## 2021-09-30 ENCOUNTER — Other Ambulatory Visit: Payer: Self-pay

## 2021-09-30 ENCOUNTER — Emergency Department (HOSPITAL_COMMUNITY)
Admission: EM | Admit: 2021-09-30 | Discharge: 2021-09-30 | Disposition: A | Discharge status: Home / Self Care | Payer: Medicare (Managed Care) | Attending: Emergency Medicine | Admitting: Emergency Medicine

## 2021-09-30 DIAGNOSIS — Z7901 Long term (current) use of anticoagulants: Secondary | ICD-10-CM | POA: Diagnosis not present

## 2021-09-30 DIAGNOSIS — I2510 Atherosclerotic heart disease of native coronary artery without angina pectoris: Secondary | ICD-10-CM | POA: Insufficient documentation

## 2021-09-30 DIAGNOSIS — S0990XA Unspecified injury of head, initial encounter: Secondary | ICD-10-CM | POA: Diagnosis present

## 2021-09-30 DIAGNOSIS — Z79899 Other long term (current) drug therapy: Secondary | ICD-10-CM | POA: Insufficient documentation

## 2021-09-30 DIAGNOSIS — I509 Heart failure, unspecified: Secondary | ICD-10-CM | POA: Diagnosis not present

## 2021-09-30 DIAGNOSIS — W01198A Fall on same level from slipping, tripping and stumbling with subsequent striking against other object, initial encounter: Secondary | ICD-10-CM | POA: Diagnosis not present

## 2021-09-30 DIAGNOSIS — W19XXXA Unspecified fall, initial encounter: Secondary | ICD-10-CM

## 2021-09-30 DIAGNOSIS — S01112A Laceration without foreign body of left eyelid and periocular area, initial encounter: Secondary | ICD-10-CM | POA: Diagnosis not present

## 2021-09-30 LAB — COMPREHENSIVE METABOLIC PANEL
ALT: 26 U/L (ref 0–44)
AST: 36 U/L (ref 15–41)
Albumin: 4.1 g/dL (ref 3.5–5.0)
Alkaline Phosphatase: 61 U/L (ref 38–126)
Anion gap: 9 (ref 5–15)
BUN: 18 mg/dL (ref 8–23)
CO2: 26 mmol/L (ref 22–32)
Calcium: 9.5 mg/dL (ref 8.9–10.3)
Chloride: 106 mmol/L (ref 98–111)
Creatinine, Ser: 1.21 mg/dL (ref 0.61–1.24)
GFR, Estimated: >60 mL/min (ref >60)
Glucose, Bld: 104 mg/dL — ABNORMAL HIGH (ref 70–99)
Potassium: 4.6 mmol/L (ref 3.5–5.1)
Sodium: 141 mmol/L (ref 135–145)
Total Bilirubin: 0.8 mg/dL (ref 0.3–1.2)
Total Protein: 7.1 g/dL (ref 6.5–8.1)

## 2021-09-30 LAB — I-STAT CHEM 8, ED
BUN: 18 mg/dL (ref 8–23)
Calcium, Ion: 1.2 mmol/L (ref 1.15–1.40)
Chloride: 105 mmol/L (ref 98–111)
Creatinine, Ser: 1.2 mg/dL (ref 0.61–1.24)
Glucose, Bld: 97 mg/dL (ref 70–99)
HCT: 44 % (ref 39.0–52.0)
Hemoglobin: 15 g/dL (ref 13.0–17.0)
Potassium: 4.2 mmol/L (ref 3.5–5.1)
Sodium: 142 mmol/L (ref 135–145)
TCO2: 26 mmol/L (ref 22–32)

## 2021-09-30 LAB — CBC WITH DIFFERENTIAL/PLATELET
Abs Immature Granulocytes: 0.02 10*3/uL (ref 0.00–0.07)
Basophils Absolute: 0 10*3/uL (ref 0.0–0.1)
Basophils Relative: 0 %
Eosinophils Absolute: 0.1 10*3/uL (ref 0.0–0.5)
Eosinophils Relative: 1 %
HCT: 42 % (ref 39.0–52.0)
Hemoglobin: 13.8 g/dL (ref 13.0–17.0)
Immature Granulocytes: 0 %
Lymphocytes Relative: 25 %
Lymphs Abs: 2.2 10*3/uL (ref 0.7–4.0)
MCH: 28.9 pg (ref 26.0–34.0)
MCHC: 32.9 g/dL (ref 30.0–36.0)
MCV: 88.1 fL (ref 80.0–100.0)
Monocytes Absolute: 0.8 10*3/uL (ref 0.1–1.0)
Monocytes Relative: 10 %
Neutro Abs: 5.7 10*3/uL (ref 1.7–7.7)
Neutrophils Relative %: 64 %
Platelets: 178 10*3/uL (ref 150–400)
RBC: 4.77 MIL/uL (ref 4.22–5.81)
RDW: 14.3 % (ref 11.5–15.5)
WBC: 8.9 10*3/uL (ref 4.0–10.5)
nRBC: 0 % (ref 0.0–0.2)

## 2021-09-30 MED ORDER — LIDOCAINE HCL (PF) 1 % IJ SOLN
5.0000 mL | Freq: Once | INTRAMUSCULAR | Status: AC
  Administered 2021-09-30: 5 mL
  Filled 2021-09-30: qty 5

## 2021-09-30 NOTE — ED Notes (Signed)
Pt ambulatory to RR without difficulty and without incident.

## 2021-09-30 NOTE — Progress Notes (Addendum)
Orthopedic Tech Progress Note Patient Details:  Raymond Christensen 05-Apr-1940 [id number]   Level 2 trauma, ortho tech services not needed at this time.  Carin Primrose 09/30/2021, 2:57 PM

## 2021-09-30 NOTE — ED Triage Notes (Signed)
Pt BIB [facility] as a level 2 fall on thinners. Pt takes eliquis. Pt arrives from grocery shopping, pt was walking and tripped, fell forward, hit the corner of a metal food display. 1.5-2 inch lac to forehead, bandage applied by fire. L elbow skin tear. A/ox4. EMS VS- 122 palp, HR 74, 99% RA. Denies LOC.

## 2021-09-30 NOTE — ED Provider Notes (Signed)
[facility] EMERGENCY DEPARTMENT Provider Note   CSN: [id number] Arrival date & time: 09/30/21  1452     History  Chief Complaint  Patient presents with   Fall    Level 2    Raymond Christensen is a 81 y.o. male. Presenting after a ground level fall. Reports tripping over his shoe at [employer] and hitting his head against a food display. Has been able to ambulate sense. No LOC. Take eliquis.   Fall Pertinent negatives include no chest pain, no abdominal pain and no shortness of breath.       Home Medications Prior to Admission medications   Medication Sig Start Date End Date Taking? Authorizing Provider  benzonatate (TESSALON) 100 MG capsule Take 1 capsule (100 mg total) by mouth 3 (three) times daily as needed. 09/19/21   [name], DO  acetaminophen (TYLENOL) 500 MG tablet Take 1,000 mg by mouth every 6 (six) hours as needed for moderate pain or headache.    [provider]  ARIPiprazole (ABILIFY) 5 MG tablet Take 5 mg by mouth daily. 06/13/21   [provider]  atorvastatin (LIPITOR) 80 MG tablet TAKE 1 TABLET DAILY 02/21/21   Belva Crome, MD  Cholecalciferol (VITAMIN D3) 250 MCG (10000 UT) capsule Take 10,000 Units by mouth daily.     [provider]  ELIQUIS 5 MG TABS tablet TAKE 1 TABLET TWICE A DAY 09/14/21   Belva Crome, MD  ENTRESTO 24-26 MG TAKE 1 TABLET TWICE A DAY 09/14/21   Belva Crome, MD  ezetimibe (ZETIA) 10 MG tablet TAKE 1 TABLET DAILY 05/14/21   Belva Crome, MD  famotidine (PEPCID) 20 MG tablet Take 1 tablet (20 mg total) by mouth daily. 09/07/19   Saguier, Percell Miller, PA-C  fenofibrate (TRICOR) 145 MG tablet TAKE ONE TABLET BY MOUTH DAILY 07/16/21   [name], DO  furosemide (LASIX) 40 MG tablet TAKE 1 TABLET DAILY 11/06/20   Isaiah Serge, NP  levocetirizine (XYZAL) 5 MG tablet TAKE 1 TABLET EVERY EVENING 07/12/21   [name], DO  levothyroxine (SYNTHROID) 75 MCG tablet TAKE 1  TABLET DAILY 06/05/21   [name], DO  liothyronine (CYTOMEL) 5 MCG tablet TAKE 2 TABLETS DAILY 10/16/20   Wendling, Crosby Oyster, DO  Multiple Vitamins-Minerals (MULTIVITAMIN ADULT EXTRA C PO) Take 1 tablet by mouth daily.     [provider]  nitroGLYCERIN (NITROSTAT) 0.4 MG SL tablet Place 1 tablet (0.4 mg total) under the tongue every 5 (five) minutes as needed for chest pain. 08/31/21   [name], DO  OXcarbazepine (TRILEPTAL) 150 MG tablet Take 150 mg by mouth 2 (two) times daily. 11/19/20   [provider]  potassium chloride (KLOR-CON) 10 MEQ tablet TAKE 2 TABLETS DAILY AS    NEEDED WITH EVERY '40MG'$  DOSEOF LASIX Patient taking differently: daily. TAKE 2 TABLETS DAILY 03/28/21   Belva Crome, MD  Semaglutide,0.25 or 0.'5MG'$ /DOS, (OZEMPIC, 0.25 OR 0.5 MG/DOSE,) 2 MG/1.5ML SOPN Inject 0.25 mg into the skin once a week for 28 days, THEN 0.5 mg once a week for 28 days. 08/31/21 10/26/21  [name], DO  sertraline (ZOLOFT) 100 MG tablet Take 100 mg by mouth daily. 11/19/20   [provider]  spironolactone (ALDACTONE) 25 MG tablet Take 0.5 tablets (12.5 mg total) by mouth daily. 09/14/21   Belva Crome, MD  vitamin B-12 (CYANOCOBALAMIN) 1000 MCG tablet Take 1,000 mcg by mouth daily.  [provider]  zinc gluconate 50 MG tablet Take 50 mg by mouth daily.    [provider]      Allergies    Sulfa antibiotics    Review of Systems   Review of Systems  Constitutional:  Negative for chills and fever.  HENT:  Negative for ear pain and sore throat.   Eyes:  Negative for pain and visual disturbance.  Respiratory:  Negative for cough and shortness of breath.   Cardiovascular:  Negative for chest pain and palpitations.  Gastrointestinal:  Negative for abdominal pain and vomiting.  Genitourinary:  Negative for dysuria and hematuria.  Musculoskeletal:  Negative for arthralgias and back pain.  Skin:  Positive for  wound. Negative for color change and rash.  Neurological:  Negative for seizures and syncope.  All other systems reviewed and are negative.   Physical Exam Updated Vital Signs BP 132/68   Pulse 71   Temp 97.7 F (36.5 C)   Resp (!) 23   Ht '5\' 8"'$  (1.727 m)   Wt 105.2 kg   SpO2 98%   BMI 35.28 kg/m  Physical Exam Vitals and nursing note reviewed.  Constitutional:      General: He is not in acute distress.    Appearance: He is well-developed.  HENT:     Head: Normocephalic and atraumatic.  Eyes:     Conjunctiva/sclera: Conjunctivae normal.  Cardiovascular:     Rate and Rhythm: Normal rate and regular rhythm.     Heart sounds: No murmur heard. Pulmonary:     Effort: Pulmonary effort is normal. No respiratory distress.     Breath sounds: Normal breath sounds.  Abdominal:     Palpations: Abdomen is soft.     Tenderness: There is no abdominal tenderness.  Musculoskeletal:        General: No swelling.     Cervical back: Neck supple.  Skin:    General: Skin is warm and dry.     Capillary Refill: Capillary refill takes less than 2 seconds.     Comments: Superficial laceration above left eyebrow approximately 3cm, bleeding controlled  Abrasion and ecchymosis to left cheek  Superficial abrasion to left elbow, bleeding controlled, nontender, ROM and strength intact LUE  Neurological:     Mental Status: He is alert.  Psychiatric:        Mood and Affect: Mood normal.     ED Results / Procedures / Treatments   Labs (all labs ordered are listed, but only abnormal results are displayed) Labs Reviewed  COMPREHENSIVE METABOLIC PANEL - Abnormal; Notable for the following components:      Result Value   Glucose, Bld 104 (*)    All other components within normal limits  CBC WITH DIFFERENTIAL/PLATELET  I-STAT CHEM 8, ED    EKG None  Radiology CT Head Wo Contrast  Result Date: 09/30/2021 CLINICAL DATA:  Fall.  Patient is on blood thinners. EXAM: CT HEAD WITHOUT CONTRAST  TECHNIQUE: Contiguous axial images were obtained from the base of the skull through the vertex without intravenous contrast. RADIATION DOSE REDUCTION: This exam was performed according to the departmental dose-optimization program which includes automated exposure control, adjustment of the mA and/or kV according to patient size and/or use of iterative reconstruction technique. COMPARISON:  MRI brain 04/01/2018 FINDINGS: Brain: No evidence of acute infarction, hemorrhage, hydrocephalus, extra-axial collection or mass lesion/mass effect. Chronic bilateral low attenuation subdural fluid collections are noted overlying the cerebral hemispheres. These appear unchanged when compared with the  previous exam. This measures 9 mm on the left and 7 mm on the right. Low-density CSF hygromas are also noted extending over the cerebellar hemispheres bilaterally. Atrophy is present as noted previously. Chronic left periventricular infarct. Vascular: No hyperdense vessel or unexpected calcification. Skull: Normal. Negative for fracture or focal lesion. Sinuses/Orbits: Mild mucosal thickening is noted involving the inferior maxillary sinuses. Other: No significant scalp hematoma identified. Asymmetric soft tissue thickening is noted overlying the left supraorbital ridge, image 15/3. IMPRESSION: 1. No acute intracranial abnormalities. 2. Stable appearance of chronic bilateral subdural fluid collections. 3. Chronic small vessel ischemic disease and brain atrophy. 4. Asymmetric soft tissue thickening overlying the left supraorbital ridge. Electronically Signed   By: Kerby Moors M.D.   On: 09/30/2021 16:02    Procedures .Marland KitchenLaceration Repair  Date/Time: 09/30/2021 5:01 PM  Performed by: Rosine Abe, MD Authorized by: Elnora Morrison, MD   Consent:    Consent obtained:  Verbal   Consent given by:  Patient   Risks discussed:  Poor cosmetic result, poor wound healing, infection, need for additional repair, pain and retained  foreign body   Alternatives discussed:  No treatment, delayed treatment and observation Universal protocol:    Procedure explained and questions answered to patient or proxy's satisfaction: yes     Patient identity confirmed:  Verbally with patient Anesthesia:    Anesthesia method:  Local infiltration   Local anesthetic:  Lidocaine 1% WITH epi Laceration details:    Location:  Scalp   Scalp location:  Frontal   Length (cm):  3 Pre-procedure details:    Preparation:  Patient was prepped and draped in usual sterile fashion and imaging obtained to evaluate for foreign bodies Exploration:    Wound exploration: entire depth of wound visualized     Contaminated: no   Treatment:    Area cleansed with:  Saline   Amount of cleaning:  Extensive   Irrigation solution:  Sterile saline   Irrigation method:  Syringe   Debridement:  None   Undermining:  None   Scar revision: no   Skin repair:    Repair method:  Sutures   Suture size:  5-0   Suture material:  Fast-absorbing gut   Suture technique:  Simple interrupted   Number of sutures:  4 Approximation:    Approximation:  Loose Repair type:    Repair type:  Intermediate Post-procedure details:    Dressing:  Antibiotic ointment and sterile dressing   Procedure completion:  Tolerated     Medications Ordered in ED Medications  lidocaine (PF) (XYLOCAINE) 1 % injection 5 mL (5 mLs Infiltration Given by Other 09/30/21 1731)    ED Course/ Medical Decision Making/ A&P                           Medical Decision Making Amount and/or Complexity of Data Reviewed Radiology: ordered.  Risk Prescription drug management.   81 year old male with past medical history of CAD, OSA, T2DM, and CHF presenting after ground level fall. He is on eliquis. No LOC. GCS 15, neuro exam nonfocal. Superficial wounds to left eyebrow and elbow.   Differential diagnosis abrasion, laceration, hematoma, concussion, intracranial hemorrhage.   Labs reassuring.   No AKI, anemia, leukocytosis, or electrolyte abnormalities.  CT head reassuring, stable appearance of chronic subdurals, no new intracranial hemorrhage.  No foreign body noted over soft tissue swelling in the left frontal region.  Wounds irrigated. Laceration to left eyebrow  repaired, see procedure note.   Discussed wound care and strict return precautions.  Discussed above results with the patient.  All questions answered.  I discussed the above results with the patient and family at bedside.  Discussed warning signs and symptoms of concussion and reasons to return.  All questions answered.  Patient and family felt comfortable discharge at this time.  Patient was able to ambulate in the ED.        Final Clinical Impression(s) / ED Diagnoses Final diagnoses:  Fall, initial encounter    Rx / DC Orders ED Discharge Orders     None         Rosine Abe, MD 09/30/21 2028    Elnora Morrison, MD 09/30/21 2336

## 2021-09-30 NOTE — ED Notes (Signed)
Patient transported to CT with this RN 

## 2021-09-30 NOTE — ED Notes (Signed)
Pt verbalizes understanding of discharge instructions. Opportunity for questions and answers were provided. Pt discharged from the ED.   ?

## 2021-09-30 NOTE — Discharge Instructions (Signed)
Return to the ED for any new concerning symptoms, as discussed.  Follow with your primary doctor.  Stay well-hydrated.  Use Tylenol as needed for discomfort.

## 2021-10-01 NOTE — Progress Notes (Signed)
Subjective:   Patient ID: Raymond Christensen, male   DOB: 81 y.o.   MRN: [id number]   HPI Patient presents with chronic nail disease and lesion formation bilateral that become painful.  He does have risk is on blood thinner and has diabetes   ROS      Objective:  Physical Exam  Neurovascular status intact with chronic keratotic lesions of the fifth metatarsal bilateral painful when pressed and has thick yellow brittle nailbeds 1-5 both feet that he cannot cut and they are painful when pressed     Assessment:  Mycotic nail infection with pain 1-5 both feet with lesions bilateral with at risk blood clotting disorder and is on blood thinner     Plan:  Reviewed condition also diabetes and debrided nailbeds 1-5 both feet no angiogenic bleeding debrided lesions bilateral no angiogenic bleeding reappoint routine care

## 2021-10-04 ENCOUNTER — Encounter: Payer: Self-pay | Admitting: Family Medicine

## 2021-10-04 ENCOUNTER — Ambulatory Visit: Payer: Medicare (Managed Care) | Admitting: Family Medicine

## 2021-10-04 ENCOUNTER — Other Ambulatory Visit (HOSPITAL_BASED_OUTPATIENT_CLINIC_OR_DEPARTMENT_OTHER): Payer: Self-pay

## 2021-10-04 VITALS — BP 118/60 | HR 78 | Temp 97.7°F | Resp 18 | Ht 68.0 in | Wt 241.2 lb

## 2021-10-04 DIAGNOSIS — E669 Obesity, unspecified: Secondary | ICD-10-CM | POA: Diagnosis not present

## 2021-10-04 DIAGNOSIS — E1169 Type 2 diabetes mellitus with other specified complication: Secondary | ICD-10-CM

## 2021-10-04 DIAGNOSIS — W19XXXA Unspecified fall, initial encounter: Secondary | ICD-10-CM

## 2021-10-04 MED ORDER — SEMAGLUTIDE (1 MG/DOSE) 4 MG/3ML ~~LOC~~ SOPN: 1.0000 mg | PEN_INJECTOR | SUBCUTANEOUS | 2 refills | Status: DC

## 2021-10-04 MED ORDER — AREXVY 120 MCG/0.5ML IM SUSR
INTRAMUSCULAR | 0 refills | Status: DC
  Filled 2021-10-04: qty 1, 1d supply, fill #0

## 2021-10-04 NOTE — Patient Instructions (Addendum)
When you do wash, use only soap and water. Do not vigorously scrub. Apply triple antibiotic ointment (like Neosporin) twice daily. Keep the area clean and dry.   Things to look out for: increasing pain not relieved by ibuprofen/acetaminophen, fevers, spreading redness, drainage of pus, or foul odor.  Ice/cold pack over area for 10-15 min three times daily.  OK to take Tylenol 1000 mg (2 extra strength tabs) or 975 mg (3 regular strength tabs) every 6 hours as needed.  Let us know if you need anything.

## 2021-10-04 NOTE — Progress Notes (Addendum)
Chief Complaint  Patient presents with   Fall   Follow-up    Subjective: Patient is a 81 y.o. male here for ED f/u after a fall. Here w wife.   Pt fell/tripped while at [employer] on 9/17. He hit his head and fell on both knees. No LOC. Went to ED. Neg CT head. Received 5 stitches above eye. Saw ophtho yesterday, no issues with eyes. Currently no pain. Improving swelling around L eye and bruising that is starting to turn yellow. Various abrasions, he has been using TAO over them twice daily. No fevers, difficulty swallowing, balance issues, vision changes, weakness, paresthesias. Taking Tylenol for pain, having swelling over knees. Walking without issues. Was not having balance issues prior to falling, shoes got caught on hard surface of the grocery store.   Taking Ozempic 0.5 mg/week for DM. Compliant, no AE's. No wt loss. Diet is healthy, not much exercise lately.   Past Medical History:  Diagnosis Date   Arthritis    CAD (coronary artery disease)    Carotid artery disease ([place])    s/p left CEA 01/20/08   Chronic systolic CHF (congestive heart failure) ([place])    Concussion Summer 2012   Essential hypertension 02/26/2017   Heart disease    Hyperlipidemia    Hypothyroidism 10/15/2017   Major depressive disorder    Mitral valve regurgitation    Myocardial infarction ([place]) 1996, 2021   Obstructive sleep apnea on CPAP    Persistent atrial fibrillation (HCC)    Poor flexibility of tendon 12/01/2018   S/P mitral valve clip implantation 12/23/2019   s/p TEER with MitraClip with one XTW by Dr. Burt Knack   Subungual hematoma of digit of hand 12/01/2018   Subungual hematoma of right foot 12/01/2018   Thyroid disease    Type 2 diabetes mellitus without complication, without long-term current use of insulin (HCC) 02/26/2017    Objective: BP 118/60 (BP Location: Right Arm, Patient Position: Sitting, Cuff Size: Large)   Pulse 78   Temp 97.7 F (36.5 C) (Oral)   Resp 18   Ht '5\' 8"'$  (1.727 m)    Wt 241 lb 3.2 oz (109.4 kg)   SpO2 97%   BMI 36.67 kg/m  General: Awake, appears stated age Heart: RRR Lungs: CTAB, no rales, wheezes or rhonchi. No accessory muscle use Ear: L canal patent, TM neg Skin: Various areas of ecchymosis around L orbit, L knee and L prox forearm, skin tear noted on L knee and L prox forearm that appear superficial without gapping. No surrounding erythema. No drainage.  Psych: Age appropriate judgment and insight, normal affect and mood  Assessment and Plan: Fall, initial encounter  Diabetes mellitus type 2 in obese ([place]) - Plan: Semaglutide, 1 MG/DOSE, 4 MG/3ML SOPN  Appears to be doing better. TAO bid for the abrasions. Warning signs and symptoms verbalized and written down in AVS. Ice, Tylenol Chronic, stable. Increase Ozempic to 1 mg/week.  The patient and his spouse voiced understanding and agreement to the plan.  I spent 35 min w the pt discussing the above plans in addition to review his chart/ED visit on the same day of the visit.   [place], DO 10/04/21  1:55 PM

## 2021-10-08 ENCOUNTER — Other Ambulatory Visit (INDEPENDENT_AMBULATORY_CARE_PROVIDER_SITE_OTHER): Payer: Medicare (Managed Care)

## 2021-10-08 DIAGNOSIS — E785 Hyperlipidemia, unspecified: Secondary | ICD-10-CM

## 2021-10-08 LAB — LIPID PANEL
Cholesterol: 113 mg/dL (ref 0–200)
HDL: 35.1 mg/dL — ABNORMAL LOW (ref >39.00)
LDL Cholesterol: 45 mg/dL (ref 0–99)
NonHDL: 78.18
Total CHOL/HDL Ratio: 3
Triglycerides: 164 mg/dL — ABNORMAL HIGH (ref 0.0–149.0)
VLDL: 32.8 mg/dL (ref 0.0–40.0)

## 2021-10-12 ENCOUNTER — Encounter: Payer: Self-pay | Admitting: Family Medicine

## 2021-10-12 ENCOUNTER — Ambulatory Visit: Payer: Medicare (Managed Care) | Admitting: Family Medicine

## 2021-10-12 VITALS — BP 120/82 | HR 73 | Temp 97.8°F | Ht 68.0 in | Wt 237.1 lb

## 2021-10-12 DIAGNOSIS — S51002D Unspecified open wound of left elbow, subsequent encounter: Secondary | ICD-10-CM | POA: Diagnosis not present

## 2021-10-12 DIAGNOSIS — L03116 Cellulitis of left lower limb: Secondary | ICD-10-CM | POA: Diagnosis not present

## 2021-10-12 MED ORDER — DOXYCYCLINE HYCLATE 100 MG PO TABS: 100.0000 mg | ORAL_TABLET | Freq: Two times a day (BID) | ORAL | 0 refills | Status: AC

## 2021-10-12 NOTE — Progress Notes (Signed)
Chief Complaint  Patient presents with   Follow-up    Left Knee pain and inflammation Left elbow pain    Raymond Christensen is a 81 y.o. male here for a skin complaint.  Duration: 10 days Location: elbow and R knee Pruritic? No Painful? Yes on R knee Drainage? No Fell at store Other associated symptoms: swelling, spreading redness on knee Therapies tried thus far: TAO  Past Medical History:  Diagnosis Date   Arthritis    CAD (coronary artery disease)    Carotid artery disease ([place])    s/p left CEA 06/21/10   Chronic systolic CHF (congestive heart failure) ([place])    Concussion Summer 2012   Essential hypertension 02/26/2017   Heart disease    Hyperlipidemia    Hypothyroidism 10/15/2017   Major depressive disorder    Mitral valve regurgitation    Myocardial infarction ([place]) 1996, 2021   Obstructive sleep apnea on CPAP    Persistent atrial fibrillation (HCC)    Poor flexibility of tendon 12/01/2018   S/P mitral valve clip implantation 12/23/2019   s/p TEER with MitraClip with one XTW by Dr. Burt Knack   Subungual hematoma of digit of hand 12/01/2018   Subungual hematoma of right foot 12/01/2018   Thyroid disease    Type 2 diabetes mellitus without complication, without long-term current use of insulin (HCC) 02/26/2017    BP 120/82 (BP Location: Left Arm, Patient Position: Sitting, Cuff Size: Normal)   Pulse 73   Temp 97.8 F (36.6 C) (Oral)   Ht '5\' 8"'$  (1.727 m)   Wt 237 lb 2 oz (107.6 kg)   SpO2 98%   BMI 36.05 kg/m  Gen: awake, alert, appearing stated age Lungs: No accessory muscle use Skin: See below. L knee w soft tissue edema and ttp/warmth; L elbow without drainage, erythema, TTP, fluctuance, excoriation Psych: Age appropriate judgment and insight   L knee   L elbow  Cellulitis of left lower extremity - Plan: doxycycline (VIBRA-TABS) 100 MG tablet  Open wound of left elbow, subsequent encounter  7 d doxy bid. Warning signs and symptoms verbalized and written  down in AVS.  TAO bid. Keep c/d. Send message in 1.5 weeks if no better and will set up with wound care team.  F/u prn. The patient voiced understanding and agreement to the plan.  [place], DO 10/12/21 12:14 PM

## 2021-10-12 NOTE — Patient Instructions (Signed)
Elbow- Do not shower for the rest of the day. When you do wash it, use only soap and water. Do not vigorously scrub. Apply triple antibiotic ointment (like Neosporin) twice daily. Keep the area clean and dry.   Things to look out for: increasing pain not relieved by acetaminophen, fevers, spreading redness, drainage of pus, or foul odor.  Knee- we are starting 7 days of an antibiotic. Monitor for spreading redness or pus formation. Fevers should warrant seeking care as well.   Ice/cold pack over area for 10-15 min twice daily.  OK to take Tylenol 1000 mg (2 extra strength tabs) or 975 mg (3 regular strength tabs) every 6 hours as needed.  Let us know if you need anything.

## 2021-10-14 ENCOUNTER — Other Ambulatory Visit: Payer: Self-pay | Admitting: Family Medicine

## 2021-11-15 ENCOUNTER — Other Ambulatory Visit: Payer: Self-pay | Admitting: Interventional Cardiology

## 2021-12-13 ENCOUNTER — Other Ambulatory Visit: Payer: Self-pay

## 2021-12-13 DIAGNOSIS — R600 Localized edema: Secondary | ICD-10-CM

## 2021-12-13 MED ORDER — FUROSEMIDE 40 MG PO TABS: 40.0000 mg | ORAL_TABLET | Freq: Every day | ORAL | 1 refills | Status: DC

## 2021-12-18 ENCOUNTER — Ambulatory Visit: Payer: Medicare (Managed Care) | Admitting: Family Medicine

## 2021-12-18 ENCOUNTER — Encounter: Payer: Self-pay | Admitting: Family Medicine

## 2021-12-18 VITALS — BP 110/64 | HR 72 | Temp 97.6°F | Ht 68.0 in | Wt 230.0 lb

## 2021-12-18 DIAGNOSIS — M533 Sacrococcygeal disorders, not elsewhere classified: Secondary | ICD-10-CM | POA: Diagnosis not present

## 2021-12-18 MED ORDER — PREDNISONE 20 MG PO TABS: 40.0000 mg | ORAL_TABLET | Freq: Every day | ORAL | 0 refills | Status: AC

## 2021-12-18 NOTE — Patient Instructions (Addendum)

## 2021-12-18 NOTE — Progress Notes (Signed)
Musculoskeletal Exam  Patient: Raymond Christensen DOB: July 13, 1940  DOS: 12/18/2021  SUBJECTIVE:  Chief Complaint:   Chief Complaint  Patient presents with   Hip Pain    Left     Raymond Christensen is a 81 y.o.  male for evaluation and treatment of L hip pain. Here w spouse.   Onset:  1 week ago. [place] airport.  Location: tailbone region on L Character:  aching and sharp  Progression of issue:  has worsened slightly Associated symptoms: pain w walking/bending over No bruising, redness, swelling Treatment: to date has been acetaminophen.   Neurovascular symptoms: no  Past Medical History:  Diagnosis Date   Arthritis    CAD (coronary artery disease)    Carotid artery disease ([place])    s/p left CEA 0/1/02   Chronic systolic CHF (congestive heart failure) ([place])    Concussion Summer 2012   Essential hypertension 02/26/2017   Heart disease    Hyperlipidemia    Hypothyroidism 10/15/2017   Major depressive disorder    Mitral valve regurgitation    Myocardial infarction ([place]) 1996, 2021   Obstructive sleep apnea on CPAP    Persistent atrial fibrillation (HCC)    Poor flexibility of tendon 12/01/2018   S/P mitral valve clip implantation 12/23/2019   s/p TEER with MitraClip with one XTW by Dr. Burt Knack   Subungual hematoma of digit of hand 12/01/2018   Subungual hematoma of right foot 12/01/2018   Thyroid disease    Type 2 diabetes mellitus without complication, without long-term current use of insulin ([place]) 02/26/2017    Objective: VITAL SIGNS: BP 110/64 (BP Location: Left Arm, Patient Position: Sitting, Cuff Size: Normal)   Pulse 72   Temp 97.6 F (36.4 C) (Oral)   Ht '5\' 8"'$  (1.727 m)   Wt 230 lb (104.3 kg)   SpO2 94%   BMI 34.97 kg/m  Constitutional: Well formed, well developed. No acute distress. Thorax & Lungs: No accessory muscle use Musculoskeletal: L hip.   Normal active range of motion: yes.   Normal passive range of motion: yes Tenderness to  palpation: yes over L SI jt Deformity: no Ecchymosis: no Tests positive: Ober's Tests negative: straight leg (very tight hamstrings), Stinchfield, log roll, FABER, FADDIR Neurologic: Normal sensory function. Gait slightly antalgic. No clonus. Psychiatric: Normal mood. Age appropriate judgment and insight. Alert & oriented x 3.    Assessment:  Sacroiliac dysfunction - Plan: predniSONE (DELTASONE) 20 MG tablet  Plan: 5 d pred burst 40 mg/d. Heat, ice, Tylenol. If no improvement, would consider injection vs PT. Consider the stretch zone has he has some work to do with his flexibility.  F/u prn. The patient and his spouse voiced understanding and agreement to the plan.   [place], DO 12/18/21  2:23 PM

## 2021-12-25 ENCOUNTER — Telehealth: Payer: Self-pay | Admitting: Family Medicine

## 2021-12-25 NOTE — Telephone Encounter (Signed)
We can do physical therapy or an injection. Place referral if he wants PT, schedule with me if he wants an injection. Ty.

## 2021-12-25 NOTE — Telephone Encounter (Signed)
Patient called to advise that his back/hip pain (sacroiliac region) has not gone away. Please call patient with what should be done next.

## 2021-12-25 NOTE — Telephone Encounter (Signed)
He preferred an injection Scheduled for 2:15 12/26/21.

## 2021-12-26 ENCOUNTER — Encounter: Payer: Self-pay | Admitting: Family Medicine

## 2021-12-26 ENCOUNTER — Ambulatory Visit: Payer: Medicare (Managed Care) | Admitting: Family Medicine

## 2021-12-26 VITALS — BP 120/70 | HR 46 | Temp 97.6°F | Ht 68.0 in | Wt 230.5 lb

## 2021-12-26 DIAGNOSIS — M533 Sacrococcygeal disorders, not elsewhere classified: Secondary | ICD-10-CM

## 2021-12-26 MED ORDER — METHYLPREDNISOLONE ACETATE 40 MG/ML IJ SUSP
40.0000 mg | Freq: Once | INTRAMUSCULAR | Status: AC
  Administered 2021-12-26: 40 mg via INTRA_ARTICULAR

## 2021-12-26 NOTE — Progress Notes (Signed)
Chief Complaint  Patient presents with   Hip Pain    Left     Subjective: Patient is a 81 y.o. male here for f/u SI jt pain.  He was started on stretches/exercises without relief. 5 d pred burst, heat, ice, Tylenol without relief. No recent injury. Does not stretch. No redness, bruising.   Past Medical History:  Diagnosis Date   Arthritis    CAD (coronary artery disease)    Carotid artery disease ([place])    s/p left CEA 0/2/72   Chronic systolic CHF (congestive heart failure) ([place])    Concussion Summer 2012   Essential hypertension 02/26/2017   Heart disease    Hyperlipidemia    Hypothyroidism 10/15/2017   Major depressive disorder    Mitral valve regurgitation    Myocardial infarction ([place]) 1996, 2021   Obstructive sleep apnea on CPAP    Persistent atrial fibrillation (HCC)    Poor flexibility of tendon 12/01/2018   S/P mitral valve clip implantation 12/23/2019   s/p TEER with MitraClip with one XTW by Dr. Burt Knack   Subungual hematoma of digit of hand 12/01/2018   Subungual hematoma of right foot 12/01/2018   Thyroid disease    Type 2 diabetes mellitus without complication, without long-term current use of insulin ([place]) 02/26/2017    Objective: BP 120/70 (BP Location: Left Arm, Patient Position: Sitting, Cuff Size: Normal)   Pulse (!) 46   Temp 97.6 F (36.4 C) (Oral)   Ht '5\' 8"'$  (1.727 m)   Wt 230 lb 8 oz (104.6 kg)   SpO2 99%   BMI 35.05 kg/m  General: Awake, appears stated age MSK: TTP over left SI joint Lungs: No accessory muscle use Psych: Age appropriate judgment and insight, normal affect and mood  Procedure note; SI joint injection Verbal consent obtained. The PSIS's were palpated and demarcated with an otoscope speculum tip just medial to the side of interest. The area was cleaned with alcohol. The joint was entered and 40 mg Kenalog with 2 mL of 1% lidocaine was injected. The area was then bandaged. There were no complications noted.  The patient  tolerated the procedure well.  Assessment and Plan: Sacroiliac dysfunction - Plan: Ambulatory referral to Physical Therapy, methylPREDNISolone acetate (DEPO-MEDROL) injection 40 mg, PR INJECT TRIGGER POINT, 1 OR 2  Refer to physical therapy, ice, heat, Tylenol, injection today. The patient voiced understanding and agreement to the plan.  [place], DO 12/26/21  4:01 PM

## 2021-12-26 NOTE — Patient Instructions (Signed)
If you do not hear anything about your referral in the next 1-2 weeks, call our office and ask for an update.  Ice/cold pack over area for 10-15 min twice daily.  OK to take Tylenol 1000 mg (2 extra strength tabs) or 975 mg (3 regular strength tabs) every 6 hours as needed.  Let us know if you need anything.

## 2021-12-27 ENCOUNTER — Telehealth: Payer: Self-pay | Admitting: Family Medicine

## 2021-12-27 MED ORDER — LEVOTHYROXINE SODIUM 75 MCG PO TABS: 75.0000 ug | ORAL_TABLET | Freq: Every day | ORAL | 1 refills | Status: DC

## 2021-12-27 NOTE — Telephone Encounter (Signed)
Rx sent to [facility]

## 2021-12-27 NOTE — Telephone Encounter (Signed)
Medication: levothyroxine (SYNTHROID) 75 MCG tablet   Has the patient contacted their pharmacy? No. Out of refills   Preferred Pharmacy (with phone number or street name):  Please send 1 week supply to [facility] [address] remaining refills to [place], [place] to [facility] [address] [place], [place] [address] Phone: [phone]  Fax: [phone]   Agent: Please be advised that RX refills may take up to 3 business days. We ask that you follow-up with your pharmacy.

## 2022-01-11 ENCOUNTER — Other Ambulatory Visit: Payer: Self-pay | Admitting: Family Medicine

## 2022-01-21 NOTE — Therapy (Incomplete)
OUTPATIENT PHYSICAL THERAPY EVALUATION   Patient Name: Raymond Christensen MRN: [id number] DOB:Nov 25, 1940, 82 y.o., male Today's Date: 01/21/2022  END OF SESSION:   Past Medical History:  Diagnosis Date   Arthritis    CAD (coronary artery disease)    Carotid artery disease ([place])    s/p left CEA 07/21/44   Chronic systolic CHF (congestive heart failure) ([place])    Concussion Summer 2012   Essential hypertension 02/26/2017   Heart disease    Hyperlipidemia    Hypothyroidism 10/15/2017   Major depressive disorder    Mitral valve regurgitation    Myocardial infarction ([place]) 1996, 2021   Obstructive sleep apnea on CPAP    Persistent atrial fibrillation (HCC)    Poor flexibility of tendon 12/01/2018   S/P mitral valve clip implantation 12/23/2019   s/p TEER with MitraClip with one XTW by Dr. Burt Knack   Subungual hematoma of digit of hand 12/01/2018   Subungual hematoma of right foot 12/01/2018   Thyroid disease    Type 2 diabetes mellitus without complication, without long-term current use of insulin ([place]) 02/26/2017   Past Surgical History:  Procedure Laterality Date   ATRIAL FIBRILLATION ABLATION N/A 02/01/2020   Procedure: ATRIAL FIBRILLATION ABLATION;  Surgeon: [name], MD;  Location: [facility];  Service: Cardiovascular;  Laterality: N/A;   BYPASS GRAFT  2001   CARDIAC CATHETERIZATION     CARDIOVERSION N/A 11/08/2019   Procedure: CARDIOVERSION;  Surgeon: [name], MD;  Location: [facility];  Service: Cardiovascular;  Laterality: N/A;   CAROTID ENDARTERECTOMY Left 01/17/2014   CORONARY ANGIOPLASTY     CORONARY ARTERY BYPASS GRAFT     MITRAL VALVE REPAIR N/A 12/23/2019   Procedure: MITRAL VALVE REPAIR;  Surgeon: [name], MD;  Location: [facility];  Service: Cardiovascular;  Laterality: N/A;   RIGHT/LEFT HEART CATH AND CORONARY/GRAFT ANGIOGRAPHY N/A 09/09/2019   Procedure: RIGHT/LEFT HEART CATH AND CORONARY/GRAFT ANGIOGRAPHY;  Surgeon: [name], MD;  Location: [facility];  Service: Cardiovascular;  Laterality: N/A;   TEE WITHOUT CARDIOVERSION N/A 09/13/2019   Procedure: TRANSESOPHAGEAL ECHOCARDIOGRAM (TEE);  Surgeon: [name], MD;  Location: [facility];  Service: Cardiovascular;  Laterality: N/A;   TEE WITHOUT CARDIOVERSION N/A 12/23/2019   Procedure: TRANSESOPHAGEAL ECHOCARDIOGRAM (TEE);  Surgeon: [name], MD;  Location: [facility];  Service: Cardiovascular;  Laterality: N/A;   TEE WITHOUT CARDIOVERSION  02/01/2020   Procedure: TRANSESOPHAGEAL ECHOCARDIOGRAM (TEE);  Surgeon: [name], MD;  Location: [facility];  Service: Cardiovascular;;   TOTAL HIP ARTHROPLASTY  1994, 1995   Patient Active Problem List   Diagnosis Date Noted   Lipoma of right upper extremity 03/02/2021   Trigger little finger of left hand 09/29/2020   Secondary hypercoagulable state ([place]) 02/29/2020   Depression, major, single episode, moderate ([place]) 02/28/2020   Nonrheumatic tricuspid valve regurgitation    S/P mitral valve clip implantation 12/23/2019   Mitral valve disease 12/23/2019   Nonrheumatic mitral valve regurgitation    Persistent atrial fibrillation (HCC)    Chronic systolic CHF (congestive heart failure) (HCC)    Acute idiopathic gout involving toe of left foot    Thrombocytopenia ([place]) 09/08/2019   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 ([place])    Diabetes mellitus type 2 in obese ([place]) 06/22/2019   Major depressive disorder    Obstructive sleep apnea on CPAP    Hypothyroidism 10/15/2017   Subclavian artery stenosis,  right ([place]) 06/15/2017   Type 2 diabetes mellitus without complication, without long-term current use of insulin ([place]) 02/26/2017   Severe obesity (BMI 35.0-39.9) with comorbidity ([place]) 02/26/2017   Essential hypertension 02/26/2017   Memory change 01/10/2017   Urge incontinence of urine 01/10/2017   Lightheaded  06/25/2016   AK (actinic keratosis) 01/03/2016   H/O syncope 03/06/2014   Occlusion and stenosis of left carotid artery 01/11/2014   Routine history and physical examination of adult 11/08/2013   History of repair of hip joint 04/20/2013   Hyperlipidemia 04/20/2013   Presence of artificial hip joint 04/20/2013   Vitamin D deficiency 06/18/2010   Concussion 01/24/2010   Elevated prostate specific antigen (PSA) 01/01/2007   Benign prostatic hyperplasia without urinary obstruction 04/22/2006   Osteoarthrosis 04/22/2006   Dyslipidemia 04/02/2006    PCP: Riki Sheer  REFERRING PROVIDER: Riki Sheer  REFERRING DIAG: M53.5- SIJ dysfunction  Rationale for Evaluation and Treatment: Rehabilitation  THERAPY DIAG:  No diagnosis found.  ONSET DATE: 12/26/21  SUBJECTIVE:                                                                                                                                                                                           SUBJECTIVE STATEMENT: ***  PERTINENT HISTORY:  ***  PAIN:  Are you having pain? {OPRCPAIN:27236}  PRECAUTIONS: {Therapy precautions:24002}  WEIGHT BEARING RESTRICTIONS: {Yes ***/No:24003}  FALLS:  Has patient fallen in last 6 months? {fallsyesno:27318}  LIVING ENVIRONMENT: Lives with: {OPRC lives with:25569::"lives with their family"} Lives in: {Lives in:25570} Stairs: {opstairs:27293} Has following equipment at home: {Assistive devices:23999}  OCCUPATION: ***  PLOF: {PLOF:24004}  PATIENT GOALS: ***  NEXT MD VISIT:   OBJECTIVE:   DIAGNOSTIC FINDINGS:  ***  PATIENT SURVEYS:  {rehab surveys:24030}  SCREENING FOR RED FLAGS: Bowel or bladder incontinence: {Yes/No:304960894} Spinal tumors: {Yes/No:304960894} Cauda equina syndrome: {Yes/No:304960894} Compression fracture: {Yes/No:304960894} Abdominal aneurysm: {Yes/No:304960894}  COGNITION: Overall cognitive status:  {cognition:24006}     SENSATION: {sensation:27233}  MUSCLE LENGTH: Hamstrings: Right *** deg; Left *** deg Thomas test: Right *** deg; Left *** deg  POSTURE: {posture:25561}  PALPATION: ***  LUMBAR ROM:   AROM eval  Flexion   Extension   Right lateral flexion   Left lateral flexion   Right rotation   Left rotation    (Blank rows = not tested)  LOWER EXTREMITY ROM:     {AROM/PROM:27142}  Right eval Left eval  Hip flexion    Hip extension    Hip abduction    Hip adduction    Hip internal rotation    Hip external rotation    Knee flexion  Knee extension    Ankle dorsiflexion    Ankle plantarflexion    Ankle inversion    Ankle eversion     (Blank rows = not tested)  LOWER EXTREMITY MMT:    MMT Right eval Left eval  Hip flexion    Hip extension    Hip abduction    Hip adduction    Hip internal rotation    Hip external rotation    Knee flexion    Knee extension    Ankle dorsiflexion    Ankle plantarflexion    Ankle inversion    Ankle eversion     (Blank rows = not tested)  LUMBAR SPECIAL TESTS:  {lumbar special test:25242}  FUNCTIONAL TESTS:  {Functional tests:24029}  GAIT: Distance walked: *** Assistive device utilized: {Assistive devices:23999} Level of assistance: {Levels of assistance:24026} Comments: ***  TODAY'S TREATMENT:                                                                                                                              DATE: ***    PATIENT EDUCATION:  Education details: *** Person educated: {Person educated:25204} Education method: {Education Method:25205} Education comprehension: {Education Comprehension:25206}  HOME EXERCISE PROGRAM: ***  ASSESSMENT:  CLINICAL IMPRESSION: Patient is a *** y.o. *** who was seen today for physical therapy evaluation and treatment for ***.   OBJECTIVE IMPAIRMENTS: {opptimpairments:25111}.   ACTIVITY LIMITATIONS: {activitylimitations:27494}  PARTICIPATION  LIMITATIONS: {participationrestrictions:25113}  PERSONAL FACTORS: {Personal factors:25162} are also affecting patient's functional outcome.   REHAB POTENTIAL: {rehabpotential:25112}  CLINICAL DECISION MAKING: {clinical decision making:25114}  EVALUATION COMPLEXITY: {Evaluation complexity:25115}   GOALS: Goals reviewed with patient? {yes/no:20286}  SHORT TERM GOALS: Target date: ***  *** Baseline: Goal status: {GOALSTATUS:25110}  2.  *** Baseline:  Goal status: {GOALSTATUS:25110}  3.  *** Baseline:  Goal status: {GOALSTATUS:25110}  4.  *** Baseline:  Goal status: {GOALSTATUS:25110}  5.  *** Baseline:  Goal status: {GOALSTATUS:25110}  6.  *** Baseline:  Goal status: {GOALSTATUS:25110}  LONG TERM GOALS: Target date: ***  *** Baseline:  Goal status: {GOALSTATUS:25110}  2.  *** Baseline:  Goal status: {GOALSTATUS:25110}  3.  *** Baseline:  Goal status: {GOALSTATUS:25110}  4.  *** Baseline:  Goal status: {GOALSTATUS:25110}  5.  *** Baseline:  Goal status: {GOALSTATUS:25110}  6.  *** Baseline:  Goal status: {GOALSTATUS:25110}  PLAN:  PT FREQUENCY: {rehab frequency:25116}  PT DURATION: {rehab duration:25117}  PLANNED INTERVENTIONS: {rehab planned interventions:25118::"Therapeutic exercises","Therapeutic activity","Neuromuscular re-education","Balance training","Gait training","Patient/Family education","Self Care","Joint mobilization"}.  PLAN FOR NEXT SESSION: ***   Andris Baumann, PT 01/21/2022, 1:45 PM

## 2022-01-22 ENCOUNTER — Ambulatory Visit: Payer: Medicare (Managed Care)

## 2022-01-23 ENCOUNTER — Ambulatory Visit: Payer: Medicare (Managed Care) | Admitting: Family

## 2022-01-27 ENCOUNTER — Other Ambulatory Visit: Payer: Self-pay | Admitting: Interventional Cardiology

## 2022-01-27 DIAGNOSIS — R600 Localized edema: Secondary | ICD-10-CM

## 2022-02-01 ENCOUNTER — Emergency Department (HOSPITAL_BASED_OUTPATIENT_CLINIC_OR_DEPARTMENT_OTHER): Payer: Medicare (Managed Care)

## 2022-02-01 ENCOUNTER — Telehealth: Payer: Self-pay | Admitting: Family Medicine

## 2022-02-01 ENCOUNTER — Other Ambulatory Visit: Payer: Self-pay

## 2022-02-01 ENCOUNTER — Emergency Department (HOSPITAL_BASED_OUTPATIENT_CLINIC_OR_DEPARTMENT_OTHER)
Admission: EM | Admit: 2022-02-01 | Discharge: 2022-02-01 | Disposition: A | Discharge status: Home / Self Care | Payer: Medicare (Managed Care) | Attending: Emergency Medicine | Admitting: Emergency Medicine

## 2022-02-01 DIAGNOSIS — Z7901 Long term (current) use of anticoagulants: Secondary | ICD-10-CM | POA: Insufficient documentation

## 2022-02-01 DIAGNOSIS — E119 Type 2 diabetes mellitus without complications: Secondary | ICD-10-CM | POA: Insufficient documentation

## 2022-02-01 DIAGNOSIS — W01198A Fall on same level from slipping, tripping and stumbling with subsequent striking against other object, initial encounter: Secondary | ICD-10-CM | POA: Insufficient documentation

## 2022-02-01 DIAGNOSIS — I2510 Atherosclerotic heart disease of native coronary artery without angina pectoris: Secondary | ICD-10-CM | POA: Insufficient documentation

## 2022-02-01 DIAGNOSIS — I5022 Chronic systolic (congestive) heart failure: Secondary | ICD-10-CM | POA: Diagnosis not present

## 2022-02-01 DIAGNOSIS — S51811A Laceration without foreign body of right forearm, initial encounter: Secondary | ICD-10-CM | POA: Insufficient documentation

## 2022-02-01 DIAGNOSIS — S59911A Unspecified injury of right forearm, initial encounter: Secondary | ICD-10-CM | POA: Diagnosis present

## 2022-02-01 DIAGNOSIS — S0083XA Contusion of other part of head, initial encounter: Secondary | ICD-10-CM | POA: Diagnosis not present

## 2022-02-01 DIAGNOSIS — M25511 Pain in right shoulder: Secondary | ICD-10-CM | POA: Diagnosis not present

## 2022-02-01 DIAGNOSIS — Y92512 Supermarket, store or market as the place of occurrence of the external cause: Secondary | ICD-10-CM | POA: Diagnosis not present

## 2022-02-01 DIAGNOSIS — S51819A Laceration without foreign body of unspecified forearm, initial encounter: Secondary | ICD-10-CM

## 2022-02-01 DIAGNOSIS — W19XXXA Unspecified fall, initial encounter: Secondary | ICD-10-CM

## 2022-02-01 NOTE — Discharge Instructions (Addendum)
You were evaluated today after a fall.  Your CT scans were reassuring for no signs of acute intracranial abnormalities or cervical spine fractures or dislocations.  Your x-rays were also reassuring for no signs of a fracture of the right shoulder.  Please keep the skin tear on the right arm clean and dry.  This will heal on its own over time.  The hematoma on your forehead will reabsorb over time.  If you develop any life-threatening symptoms such as altered level of consciousness, chest pain, or shortness of breath, please return to the emergency department.  Otherwise please follow-up with your primary care provider as needed  Based on the events which brought you to the ER today, it is possible that you may have a concussion. A concussion occurs when there is a blow to the head or body, with enough force to shake the brain and disrupt how the brain functions. You may experience symptoms such as headaches, sensitivity to light/noise, dizziness, cognitive slowing, difficulty concentrating / remembering, trouble sleeping and drowsiness. These symptoms may last anywhere from hours/days to potentially weeks/months. While these symptoms are very frustrating and perhaps debilitating, it is important that you remember that they will improve over time. Everyone has a different rate of recovery; it is difficult to predict when your symptoms will resolve. In order to allow for your brain to heal after the injury, we recommend that you see your primary physician or a physician knowledgeable in concussion management. We also advise you to let your body and brain rest: avoid physical activities (sports, gym, and exercise) and reduce cognitive demands (reading, texting, TV watching, computer use, video games, etc). School attendance, after-school activities and work may need to be modified to avoid increasing symptoms. We recommend against driving until until all symptoms have resolved. Come back to the ER right away if you  are having repeated episodes of vomiting, severe/worsening headache/dizziness or any other symptom that alarms you. We recommended that someone stay with you for the next 24 hours to monitor for these worrisome symptoms.  It was a pleasure caring for you today in the emergency department.  Please return to the emergency department for any worsening or worrisome symptoms.

## 2022-02-01 NOTE — ED Provider Notes (Signed)
[facility] Provider Note   CSN: [id number] Arrival date & time: 02/01/22  1516     History  Chief Complaint  Patient presents with   Fall on thinners    Raymond Christensen is a 82 y.o. male.  Patient presents the emergency room secondary to a fall.  Patient was at a local grocery store when he tripped, falling forward and hitting his head on the floor.  The patient denies losing consciousness.  He does take Eliquis due to history of persistent atrial fibrillation.  Patient also complains of right shoulder pain.  Past medical history significant for coronary artery disease, type 2 diabetes mellitus, previous MI, chronic systolic CHF, persistent A-fib  HPI     Home Medications Prior to Admission medications   Medication Sig Start Date End Date Taking? Authorizing Provider  acetaminophen (TYLENOL) 500 MG tablet Take 1,000 mg by mouth every 6 (six) hours as needed for moderate pain or headache.    [provider]  ARIPiprazole (ABILIFY) 5 MG tablet Take 5 mg by mouth daily. 06/13/21   [provider]  atorvastatin (LIPITOR) 80 MG tablet TAKE 1 TABLET DAILY 11/15/21   Belva Crome, MD  Cholecalciferol (VITAMIN D3) 250 MCG (10000 UT) capsule Take 10,000 Units by mouth daily.     [provider]  ELIQUIS 5 MG TABS tablet TAKE 1 TABLET TWICE A DAY 09/14/21   Belva Crome, MD  ENTRESTO 24-26 MG TAKE 1 TABLET TWICE A DAY 09/14/21   Belva Crome, MD  ezetimibe (ZETIA) 10 MG tablet TAKE 1 TABLET DAILY 05/14/21   Belva Crome, MD  famotidine (PEPCID) 20 MG tablet Take 1 tablet (20 mg total) by mouth daily. 09/07/19   Saguier, Percell Miller, PA-C  fenofibrate (TRICOR) 145 MG tablet TAKE ONE TABLET BY MOUTH DAILY 07/16/21   [name], DO  furosemide (LASIX) 40 MG tablet Take 1 tablet (40 mg total) by mouth daily. 12/13/21   Belva Crome, MD  levocetirizine (XYZAL) 5 MG tablet TAKE 1 TABLET EVERY EVENING 01/11/22    [name], DO  levothyroxine (SYNTHROID) 75 MCG tablet Take 1 tablet (75 mcg total) by mouth daily. 12/27/21   [name], DO  liothyronine (CYTOMEL) 5 MCG tablet TAKE 2 TABLETS DAILY 10/15/21   Wendling, Crosby Oyster, DO  Multiple Vitamins-Minerals (MULTIVITAMIN ADULT EXTRA C PO) Take 1 tablet by mouth daily.     [provider]  nitroGLYCERIN (NITROSTAT) 0.4 MG SL tablet Place 1 tablet (0.4 mg total) under the tongue every 5 (five) minutes as needed for chest pain. 08/31/21   [name], DO  OXcarbazepine (TRILEPTAL) 150 MG tablet Take 150 mg by mouth 2 (two) times daily. 11/19/20   [provider]  potassium chloride (KLOR-CON) 10 MEQ tablet TAKE 2 TABLETS DAILY AS    NEEDED WITH EVERY '40MG'$      DOSE OF LASIX. 01/28/22   Belva Crome, MD  RSV vaccine recomb adjuvanted (AREXVY) 120 MCG/0.5ML injection Inject into the muscle. 10/04/21   Carlyle Basques, MD  Semaglutide, 1 MG/DOSE, 4 MG/3ML SOPN Inject 1 mg as directed once a week. 10/04/21   [name], DO  sertraline (ZOLOFT) 100 MG tablet Take 100 mg by mouth daily. 11/19/20   [provider]  spironolactone (ALDACTONE) 25 MG tablet Take 0.5 tablets (12.5 mg total) by mouth daily. 09/14/21   Belva Crome, MD  vitamin B-12 (CYANOCOBALAMIN) 1000 MCG tablet  Take 1,000 mcg by mouth daily.    [provider]  zinc gluconate 50 MG tablet Take 50 mg by mouth daily.    [provider]      Allergies    Sulfa antibiotics    Review of Systems   Review of Systems  Musculoskeletal:  Positive for arthralgias.  Skin:  Positive for wound.    Physical Exam Updated Vital Signs BP 116/69   Pulse 70   Temp 97.9 F (36.6 C) (Oral)   Resp 18   SpO2 93%  Physical Exam Vitals and nursing note reviewed.  Constitutional:      General: He is not in acute distress.    Appearance: He is well-developed.  HENT:     Head: Normocephalic and atraumatic.  Eyes:      Conjunctiva/sclera: Conjunctivae normal.  Cardiovascular:     Rate and Rhythm: Normal rate and regular rhythm.     Heart sounds: No murmur heard. Pulmonary:     Effort: Pulmonary effort is normal. No respiratory distress.     Breath sounds: Normal breath sounds.  Abdominal:     Palpations: Abdomen is soft.     Tenderness: There is no abdominal tenderness.  Musculoskeletal:        General: No swelling.     Cervical back: Neck supple.  Skin:    General: Skin is warm and dry.     Capillary Refill: Capillary refill takes less than 2 seconds.     Comments: Hematoma noted on right forehead.  Skin tear noted to dorsal surface of right forearm.  Neurological:     Mental Status: He is alert.  Psychiatric:        Mood and Affect: Mood normal.     ED Results / Procedures / Treatments   Labs (all labs ordered are listed, but only abnormal results are displayed) Labs Reviewed - No data to display  EKG None  Radiology DG Shoulder Right  Result Date: 02/01/2022 CLINICAL DATA:  Right shoulder pain EXAM: RIGHT SHOULDER - 3 VIEW COMPARISON:  None Available. FINDINGS: No evidence of fracture or dislocation. Mild degenerative changes of the acromioclavicular joint. Soft tissues are unremarkable. IMPRESSION: Negative. Electronically Signed   By: Yetta Glassman M.D.   On: 02/01/2022 16:46   CT Head Wo Contrast  Result Date: 02/01/2022 CLINICAL DATA:  Head trauma, moderate-severe; Neck trauma (Age >= 65y) EXAM: CT HEAD WITHOUT CONTRAST CT CERVICAL SPINE WITHOUT CONTRAST TECHNIQUE: Multidetector CT imaging of the head and cervical spine was performed following the standard protocol without intravenous contrast. Multiplanar CT image reconstructions of the cervical spine were also generated. RADIATION DOSE REDUCTION: This exam was performed according to the departmental dose-optimization program which includes automated exposure control, adjustment of the mA and/or kV according to patient size and/or  use of iterative reconstruction technique. COMPARISON:  CT head 09/30/2021. FINDINGS: CT HEAD FINDINGS Brain: Stable chronic hypodense bilateral subdural collections. No evidence of acute hemorrhage, acute large vascular territory infarct, mass lesion, midline shift or hydrocephalus. Vascular: No hyperdense vessel identified. Skull: No acute fracture.  Right forehead contusion. Sinuses/Orbits: Bilateral maxillary sinus air-fluid levels, partially imaged. No acute orbital findings. Other: No mastoid effusions. CT CERVICAL SPINE FINDINGS Alignment: Mild stepwise degenerative anterolisthesis of C3 on C4 and C4 on C5. Otherwise, no substantial sagittal subluxation. Skull base and vertebrae: Vertebral heights are maintained. No evidence of acute fracture. Soft tissues and spinal canal: No prevertebral fluid or swelling. No visible canal hematoma. Disc levels: Moderate to  severe multilevel degenerative change including facet and uncovertebral hypertrophy with varying degrees of neural foraminal stenosis. Upper chest: Visualized lung apices are clear. IMPRESSION: CT head: 1. No evidence of acute intracranial abnormality. 2. Stable chronic hypodense bilateral subdural collections. 3. Right forehead contusion. CT cervical spine: No evidence of acute fracture or traumatic malalignment. Electronically Signed   By: Margaretha Sheffield M.D.   On: 02/01/2022 15:52   CT Cervical Spine Wo Contrast  Result Date: 02/01/2022 CLINICAL DATA:  Head trauma, moderate-severe; Neck trauma (Age >= 65y) EXAM: CT HEAD WITHOUT CONTRAST CT CERVICAL SPINE WITHOUT CONTRAST TECHNIQUE: Multidetector CT imaging of the head and cervical spine was performed following the standard protocol without intravenous contrast. Multiplanar CT image reconstructions of the cervical spine were also generated. RADIATION DOSE REDUCTION: This exam was performed according to the departmental dose-optimization program which includes automated exposure control,  adjustment of the mA and/or kV according to patient size and/or use of iterative reconstruction technique. COMPARISON:  CT head 09/30/2021. FINDINGS: CT HEAD FINDINGS Brain: Stable chronic hypodense bilateral subdural collections. No evidence of acute hemorrhage, acute large vascular territory infarct, mass lesion, midline shift or hydrocephalus. Vascular: No hyperdense vessel identified. Skull: No acute fracture.  Right forehead contusion. Sinuses/Orbits: Bilateral maxillary sinus air-fluid levels, partially imaged. No acute orbital findings. Other: No mastoid effusions. CT CERVICAL SPINE FINDINGS Alignment: Mild stepwise degenerative anterolisthesis of C3 on C4 and C4 on C5. Otherwise, no substantial sagittal subluxation. Skull base and vertebrae: Vertebral heights are maintained. No evidence of acute fracture. Soft tissues and spinal canal: No prevertebral fluid or swelling. No visible canal hematoma. Disc levels: Moderate to severe multilevel degenerative change including facet and uncovertebral hypertrophy with varying degrees of neural foraminal stenosis. Upper chest: Visualized lung apices are clear. IMPRESSION: CT head: 1. No evidence of acute intracranial abnormality. 2. Stable chronic hypodense bilateral subdural collections. 3. Right forehead contusion. CT cervical spine: No evidence of acute fracture or traumatic malalignment. Electronically Signed   By: Margaretha Sheffield M.D.   On: 02/01/2022 15:52    Procedures Procedures    Medications Ordered in ED Medications - No data to display  ED Course/ Medical Decision Making/ A&P                             Medical Decision Making Amount and/or Complexity of Data Reviewed Radiology: ordered.   This patient presents to the ED for concern of injuries secondary to a fall, this involves an extensive number of treatment options, and is a complaint that carries with it a high risk of complications and morbidity.  The differential diagnosis  includes intracranial abnormalities, fracture, dislocation, soft tissue injuries, and others   Co morbidities that complicate the patient evaluation  Chronic anticoagulation   Additional history obtained:  Additional history obtained from family at bedside    Imaging Studies ordered:  I ordered imaging studies including plain films of the right shoulder I independently visualized and interpreted imaging which showed no fracture or dislocation of the right shoulder. I also ordered CTs scans of the head and cervical spine without contrast. 1. No evidence of acute intracranial abnormality.  2. Stable chronic hypodense bilateral subdural collections.  3. Right forehead contusion.  No evidence of acute fracture or traumatic malalignment of the cervical spine I agree with the radiologist interpretation   Test / Admission - Considered:  No acute intracranial abnormality or cervical spine fracture or malalignment was noted on imaging.  The patient's hematoma will reabsorb over time.  The skin tear noted to the right forearm is unable to be repaired.  This was bandaged.  Patient will be provided instructions on keeping the wound clean.        Final Clinical Impression(s) / ED Diagnoses Final diagnoses:  Fall, initial encounter  Traumatic hematoma of forehead, initial encounter  Skin tear of forearm without complication, initial encounter    Rx / DC Orders ED Discharge Orders     None         Ronny Bacon 02/01/22 [place], DO 02/02/22 2225

## 2022-02-01 NOTE — Telephone Encounter (Signed)
Patient's wife called and said he fell in [employer] and has a lump on his head but is not bleeding. Patient's wife wants him to be evaluated. Advised we did not have any openings in the office but I would check with clinical staff about what he should do. Phone call was disconnected while waiting for response.    Called patient back and lvm to advise them that DOD and a NP advised that he should go to ED due to lump on his head in case imaging is needed.

## 2022-02-01 NOTE — ED Notes (Signed)
Pt A&OX4 ambulatory at d/c with independent steady gait but was wheeled out of the ED via wheelchair. Pt verbalized understanding of d/c instructions and follow up care.

## 2022-02-01 NOTE — ED Triage Notes (Signed)
Pt had a trip and fall about an hour ago striking right forehead.  Hematoma noted.  Pt takes eliquis.  Ordered CT head and c spine

## 2022-02-01 NOTE — ED Notes (Signed)
ED Provider at bedside. 

## 2022-02-04 ENCOUNTER — Ambulatory Visit: Payer: Medicare (Managed Care) | Admitting: Family Medicine

## 2022-02-04 ENCOUNTER — Encounter: Payer: Self-pay | Admitting: Family Medicine

## 2022-02-04 ENCOUNTER — Telehealth: Payer: Self-pay | Admitting: Family Medicine

## 2022-02-04 VITALS — BP 122/70 | HR 90 | Temp 97.9°F

## 2022-02-04 DIAGNOSIS — M25511 Pain in right shoulder: Secondary | ICD-10-CM | POA: Diagnosis not present

## 2022-02-04 DIAGNOSIS — G252 Other specified forms of tremor: Secondary | ICD-10-CM | POA: Diagnosis not present

## 2022-02-04 DIAGNOSIS — S060X0A Concussion without loss of consciousness, initial encounter: Secondary | ICD-10-CM | POA: Diagnosis not present

## 2022-02-04 DIAGNOSIS — R296 Repeated falls: Secondary | ICD-10-CM | POA: Diagnosis not present

## 2022-02-04 MED ORDER — TIZANIDINE HCL 4 MG PO TABS: 4.0000 mg | ORAL_TABLET | Freq: Four times a day (QID) | ORAL | 0 refills | Status: DC | PRN

## 2022-02-04 NOTE — Telephone Encounter (Signed)
Pt's wife advised after being seen he threw up. She stated he has not eaten anything or taken and medication yet.

## 2022-02-04 NOTE — Patient Instructions (Signed)
Ice/cold pack over area for 10-15 min twice daily.  Ice/cold pack over area for 10-15 min twice daily.  OK to take Tylenol 1000 mg (2 extra strength tabs) or 975 mg (3 regular strength tabs) every 6 hours as needed.  Avoid aggravating activities.   Fish oil 3 grams daily for 10 days then 2 grams daily  Vitamin D 4000 IU daily  CoQ10 '200mg'$  daily for headaches  Tart cherry extract any dose at night  To help improve COGNITIVE function: Using fish oil/omega 3 that is 1000 mg (or roughly 600 mg EPA/DHA), starting as soon as possible after concussion, take: 3 tabs THREE TIMES a day  for the first 3 days, then (you will smell a little, sorry) 3 tabs TWICE DAILY  for the next 3 days, then 3 tabs ONCE DAILY  for the next 10 days    To help reduce HEADACHES: Coenzyme Q10 '160mg'$  ONCE DAILY Riboflavin/Vitamin B2 '400mg'$  ONCE DAILY Magnesium oxide 400 mg ONE-TWO TIMES DAILY May stop after headaches are resolved.                                                                                               To help with INSOMNIA: Melatonin 3-'5mg'$  AT BEDTIME Tart cherry extract, any dose at night    Other medicines to help decrease inflammation Alpha Lipoic Acid '100mg'$  TWICE DAILY Turmeric '500mg'$  twice daily Iron '65mg'$  elemental daily Vitamin D 4000 IU daily for 2 weeks then 2000 IU daily thereafter.  EXERCISES  RANGE OF MOTION (ROM) AND STRETCHING EXERCISES These exercises may help you when beginning to rehabilitate your injury. While completing these exercises, remember:  Restoring tissue flexibility helps normal motion to return to the joints. This allows healthier, less painful movement and activity. An effective stretch should be held for at least 30 seconds. A stretch should never be painful. You should only feel a gentle lengthening or release in the stretched tissue.  ROM - Pendulum Bend at the waist so that your right / left arm falls away from your body. Support yourself with your  opposite hand on a solid surface, such as a table or a countertop. Your right / left arm should be perpendicular to the ground. If it is not perpendicular, you need to lean over farther. Relax the muscles in your right / left arm and shoulder as much as possible. Gently sway your hips and trunk so they move your right / left arm without any use of your right / left shoulder muscles. Progress your movements so that your right / left arm moves side to side, then forward and backward, and finally, both clockwise and counterclockwise. Complete 10-15 repetitions in each direction. Many people use this exercise to relieve discomfort in their shoulder as well as to gain range of motion. Repeat 2 times. Complete this exercise 3 times per week.  STRETCH - Flexion, Standing Stand with good posture. With an underhand grip on your right / left hand and an overhand grip on the opposite hand, grasp a broomstick or cane so that your hands are a little more than shoulder-width apart. Keeping  your right / left elbow straight and shoulder muscles relaxed, push the stick with your opposite hand to raise your right / left arm in front of your body and then overhead. Raise your arm until you feel a stretch in your right / left shoulder, but before you have increased shoulder pain. Try to avoid shrugging your right / left shoulder as your arm rises by keeping your shoulder blade tucked down and toward your mid-back spine. Hold 30 seconds. Slowly return to the starting position. Repeat 2 times. Complete this exercise 3 times per week.  STRETCH - Internal Rotation Place your right / left hand behind your back, palm-up. Throw a towel or belt over your opposite shoulder. Grasp the towel/belt with your right / left hand. While keeping an upright posture, gently pull up on the towel/belt until you feel a stretch in the front of your right / left shoulder. Avoid shrugging your right / left shoulder as your arm rises by keeping  your shoulder blade tucked down and toward your mid-back spine. Hold 30. Release the stretch by lowering your opposite hand. Repeat 2 times. Complete this exercise 3 times per week.  STRETCH - External Rotation and Abduction Stagger your stance through a doorframe. It does not matter which foot is forward. As instructed by your physician, physical therapist or athletic trainer, place your hands: And forearms above your head and on the door frame. And forearms at head-height and on the door frame. At elbow-height and on the door frame. Keeping your head and chest upright and your stomach muscles tight to prevent over-extending your low-back, slowly shift your weight onto your front foot until you feel a stretch across your chest and/or in the front of your shoulders. Hold 30 seconds. Shift your weight to your back foot to release the stretch. Repeat 2 times. Complete this stretch 3 times per week.   STRENGTHENING EXERCISES  These exercises may help you when beginning to rehabilitate your injury. They may resolve your symptoms with or without further involvement from your physician, physical therapist or athletic trainer. While completing these exercises, remember:  Muscles can gain both the endurance and the strength needed for everyday activities through controlled exercises. Complete these exercises as instructed by your physician, physical therapist or athletic trainer. Progress the resistance and repetitions only as guided. You may experience muscle soreness or fatigue, but the pain or discomfort you are trying to eliminate should never worsen during these exercises. If this pain does worsen, stop and make certain you are following the directions exactly. If the pain is still present after adjustments, discontinue the exercise until you can discuss the trouble with your clinician. If advised by your physician, during your recovery, avoid activity or exercises which involve actions that place  your right / left hand or elbow above your head or behind your back or head. These positions stress the tissues which are trying to heal.  STRENGTH - Scapular Depression and Adduction With good posture, sit on a firm chair. Supported your arms in front of you with pillows, arm rests or a table top. Have your elbows in line with the sides of your body. Gently draw your shoulder blades down and toward your mid-back spine. Gradually increase the tension without tensing the muscles along the top of your shoulders and the back of your neck. Hold for 3 seconds. Slowly release the tension and relax your muscles completely before completing the next repetition. After you have practiced this exercise, remove the arm  support and complete it in standing as well as sitting. Repeat 2 times. Complete this exercise 3 times per week.   STRENGTH - External Rotators Secure a rubber exercise band/tubing to a fixed object so that it is at the same height as your right / left elbow when you are standing or sitting on a firm surface. Stand or sit so that the secured exercise band/tubing is at your side that is not injured. Bend your elbow 90 degrees. Place a folded towel or small pillow under your right / left arm so that your elbow is a few inches away from your side. Keeping the tension on the exercise band/tubing, pull it away from your body, as if pivoting on your elbow. Be sure to keep your body steady so that the movement is only coming from your shoulder rotating. Hold 3 seconds. Release the tension in a controlled manner as you return to the starting position. Repeat 2 times. Complete this exercise 3 times per week.   STRENGTH - Supraspinatus Stand or sit with good posture. Grasp a 2-3 lb weight or an exercise band/tubing so that your hand is "thumbs-up," like when you shake hands. Slowly lift your right / left hand from your thigh into the air, traveling about 30 degrees from straight out at your side. Lift  your hand to shoulder height or as far as you can without increasing any shoulder pain. Initially, many people do not lift their hands above shoulder height. Avoid shrugging your right / left shoulder as your arm rises by keeping your shoulder blade tucked down and toward your mid-back spine. Hold for 3 seconds. Control the descent of your hand as you slowly return to your starting position. Repeat 2 times. Complete this exercise 3 times per week.   STRENGTH - Shoulder Extensors Secure a rubber exercise band/tubing so that it is at the height of your shoulders when you are either standing or sitting on a firm arm-less chair. With a thumbs-up grip, grasp an end of the band/tubing in each hand. Straighten your elbows and lift your hands straight in front of you at shoulder height. Step back away from the secured end of band/tubing until it becomes tense. Squeezing your shoulder blades together, pull your hands down to the sides of your thighs. Do not allow your hands to go behind you. Hold for 3 seconds. Slowly ease the tension on the band/tubing as you reverse the directions and return to the starting position. Repeat 2 times. Complete this exercise 3 times per week.   STRENGTH - Scapular Retractors Secure a rubber exercise band/tubing so that it is at the height of your shoulders when you are either standing or sitting on a firm arm-less chair. With a palm-down grip, grasp an end of the band/tubing in each hand. Straighten your elbows and lift your hands straight in front of you at shoulder height. Step back away from the secured end of band/tubing until it becomes tense. Squeezing your shoulder blades together, draw your elbows back as you bend them. Keep your upper arm lifted away from your body throughout the exercise. Hold 3 seconds. Slowly ease the tension on the band/tubing as you reverse the directions and return to the starting position. Repeat 2 times. Complete this exercise 3 times per  week.  STRENGTH - Scapular Depressors Find a sturdy chair without wheels, such as a from a dining room table. Keeping your feet on the floor, lift your bottom from the seat and lock your elbows.  Keeping your elbows straight, allow gravity to pull your body weight down. Your shoulders will rise toward your ears. Raise your body against gravity by drawing your shoulder blades down your back, shortening the distance between your shoulders and ears. Although your feet should always maintain contact with the floor, your feet should progressively support less body weight as you get stronger. Hold 3 seconds. In a controlled and slow manner, lower your body weight to begin the next repetition. Repeat 2 times. Complete this exercise 3 times per week.    This information is not intended to replace advice given to you by your health care provider. Make sure you discuss any questions you have with your health care provider.   Document Released: 11/14/2004 Document Revised: 01/21/2014 Document Reviewed: 04/14/2008 Elsevier Interactive Patient Education [employer].

## 2022-02-04 NOTE — Telephone Encounter (Signed)
Called the patients wife informed of PCP instructions. She said he is sleeping more today/and the vomiting/sleeping are new?

## 2022-02-04 NOTE — Progress Notes (Signed)
Chief Complaint  Patient presents with   Fall   Hospitalization Follow-up    Subjective: Patient is a 82 y.o. male here for ED f/u. He is here with his wife.  Patient went to the emergency department on 02/01/2022 after falling and hitting his head.  The patient was at the grocery store and tripped causing him to fall forward.  He did not lose consciousness.  CT of the head was unremarkable. R shoulder has lost ROM and is tender on top. He has been having a tremor in both of his hands when he does things.   Past Medical History:  Diagnosis Date   Arthritis    CAD (coronary artery disease)    Carotid artery disease ([place])    s/p left CEA 01/16/06   Chronic systolic CHF (congestive heart failure) ([place])    Concussion Summer 2012   Essential hypertension 02/26/2017   Heart disease    Hyperlipidemia    Hypothyroidism 10/15/2017   Major depressive disorder    Mitral valve regurgitation    Myocardial infarction ([place]) 1996, 2021   Obstructive sleep apnea on CPAP    Persistent atrial fibrillation ([facility])    Poor flexibility of tendon 12/01/2018   S/P mitral valve clip implantation 12/23/2019   s/p TEER with MitraClip with one XTW by Dr. Burt Knack   Subungual hematoma of digit of hand 12/01/2018   Subungual hematoma of right foot 12/01/2018   Thyroid disease    Type 2 diabetes mellitus without complication, without long-term current use of insulin ([place]) 02/26/2017    Objective: BP 122/70 (BP Location: Left Arm, Patient Position: Sitting, Cuff Size: Large)   Pulse 90   Temp 97.9 F (36.6 C) (Oral)   SpO2 96%  General: Awake, appears stated age Skin: Raised hematoma noted on the anterior right forehead, ecchymosis noted around the right orbit; no external lesions noted over the right shoulder region MSK: Decreased active and passive range of motion with forward flexion.  No deformity, edema, or ecchymosis.  Positive Neer's and Hawkins, negative empty can, crossover, liftoff, speeds Neuro: Gait  is slow and cautious, and intention tremors noted in bilateral upper extremities, mild resting tremor, DTRs equal and symmetric throughout, no clonus, no cerebellar signs, 5/5 strength throughout Heart: RRR Lungs: CTAB, no rales, wheezes or rhonchi. No accessory muscle use Psych: Age appropriate judgment and insight, normal affect and mood  Assessment and Plan: Concussion without loss of consciousness, initial encounter  Acute pain of right shoulder  Intention tremor  Supplements for improvement of symptoms provided.  Because of this diagnosis and his balance, I would like to avoid opiates.  He understands this. Heat, ice, Tylenol, nonsedating muscle relaxer, stretches and exercises.  I do not think he tore his supraspinatus fortunately.  He has decent strength on the empty can test.  Could consider physical therapy if no improvement. Could be related to his recent head injury.  If it does not resolve in the next 4 to 6 weeks, will consider treatment for this. He has an appointment with the physical therapy team next week.r The patient voiced understanding and agreement to the plan.  I spent 32 min w the pt and his spouse discussing the above plans in addition to reviewing his chart, mainly his recent ED visit, on the same day of the visit.   [place], DO 02/04/22  12:21 PM

## 2022-02-04 NOTE — Telephone Encounter (Signed)
Patients wife informed of PCP instructions. 

## 2022-02-05 NOTE — Telephone Encounter (Signed)
If not improving with his other symptoms, more likely a stomach bug given the timing of things.

## 2022-02-05 NOTE — Telephone Encounter (Signed)
Called the patients wife and informed of advice from yesterday that if any symptoms worsen would need to return to the ED. She stated he is just throwing up everything she gives him. She wants to know is this from the concussion/or possible picked up a stomach virus? The wife wanted to hear back from PCP response,

## 2022-02-05 NOTE — Telephone Encounter (Signed)
Patients wife informed of PCP response

## 2022-02-05 NOTE — Telephone Encounter (Signed)
Patient's wife called to advise vomiting is still continuing. She's been giving him electrolytes but he is throwing that up as well. He hasn't eaten in last 2 days. Patient's wife wants further advice on what to do for this. Patient is not stumbling or anything like but he is weak. Please call to discuss.

## 2022-02-09 ENCOUNTER — Other Ambulatory Visit: Payer: Self-pay | Admitting: Family Medicine

## 2022-02-09 MED ORDER — COLCHICINE 0.6 MG PO TABS: ORAL_TABLET | ORAL | 0 refills | Status: DC

## 2022-02-11 ENCOUNTER — Telehealth: Payer: Self-pay

## 2022-02-11 ENCOUNTER — Encounter: Payer: Self-pay | Admitting: Family Medicine

## 2022-02-11 ENCOUNTER — Ambulatory Visit: Payer: Medicare (Managed Care) | Admitting: Family Medicine

## 2022-02-11 VITALS — BP 110/82 | HR 101 | Temp 97.6°F | Ht 68.0 in | Wt 219.0 lb

## 2022-02-11 DIAGNOSIS — M109 Gout, unspecified: Secondary | ICD-10-CM

## 2022-02-11 MED ORDER — METHYLPREDNISOLONE ACETATE 80 MG/ML IJ SUSP
80.0000 mg | Freq: Once | INTRAMUSCULAR | Status: AC
  Administered 2022-02-11: 80 mg via INTRAMUSCULAR

## 2022-02-11 NOTE — Telephone Encounter (Signed)
Appt scheduled w/ PCP.

## 2022-02-11 NOTE — Telephone Encounter (Signed)
Nurse Assessment Nurse: Larene Pickett RN, Marcie Bal Date/Time Eilene Ghazi Time): 02/09/2022 9:46:30 AM Confirm and document reason for call. If symptomatic, describe symptoms. ---Caller states her husband has gout. She states he woke up this morning with left great toe pain. Pain started yesterday. Pain is currently a 8/10 on great toe. Great toe is not red but is swollen. Has taken gout medication in the past for flare ups but not currently taking any gout medication. Patient had a fall a week ago yesterday. Was diagnosed with a possible concussion and has been drinking gatorade and has had a decreased appetite. Does the patient have any new or worsening symptoms? ---Yes Will a triage be completed? ---Yes Related visit to physician within the last 2 weeks? ---No Does the PT have any chronic conditions? (i.e. diabetes, asthma, this includes High risk factors for pregnancy, etc.) ---Yes List chronic conditions. ---Gout HTN Hyperlipidemia Hypothyroidism Is this a behavioral health or substance abuse call? ---No Nurse: Larene Pickett, RN, Marcie Bal Date/Time (Eastern Time): 02/09/2022 9:56:08 AM Please select the assessment type ---Verbal order / New medication order Does the client directives allow for assistance with medications after hours? ---Yes Other current medications? ---Yes PLEASE NOTE: All timestamps contained within this report are represented as [place] Standard Time. CONFIDENTIALTY NOTICE: This fax transmission is intended only for the addressee. It contains information that is legally privileged, confidential or otherwise protected from use or disclosure. If you are not the intended recipient, you are strictly prohibited from reviewing, disclosing, copying using or disseminating any of this information or taking any action in reliance on or regarding this information. If you have received this fax in error, please notify us immediately by telephone so that we can arrange for its return to [place]. Phone:  [phone], Toll-Free: [phone], Fax: [phone] Page: 2 of 3 Call Id: [id number] Nurse Assessment List current medications. ---Eliquis Abilify Atoravastatin Entresto Zetia Pepcid Tricor Xizal Synthroid Cytomel Toprol K+ Zoloft Adalactone Medication allergies? ---Yes List medication allergies. ---Sulfa antibiotics Pharmacy name and phone number. ---[facility] [phone] Does the client directive allow for RN to call in the medication order to the pharmacy? ---No Guidelines Guideline Title Affirmed Question Affirmed Notes Nurse Date/Time Eilene Ghazi Time) Toe Pain [1] SEVERE pain (e.g., excruciating, unable to do any normal activities) AND [2] not improved after 2 hours of pain medicine Bobby Rumpf 02/09/2022 9:51:55 AM Disp. Time Eilene Ghazi Time) Disposition Final User 02/09/2022 10:06:16 AM Paged On Call back to [facility], RNMarcie Bal 02/09/2022 10:07:46 AM See HCP within 4 Hours (or PCP triage) Yes Larene Pickett, RN, Marcie Bal 02/09/2022 10:08:38 AM Paged On Call back to [facility], RNMarcie Bal 02/09/2022 10:27:06 AM Paged On Call back to Call [place], RN, Marcie Bal Final Disposition 02/09/2022 10:07:46 AM See HCP within 4 Hours (or PCP triage) Yes Larene Pickett, RN, Lenon Oms Disagree/Comply Comply Caller Understands Yes PreDisposition [place] Advice Given Per Guideline SEE HCP (OR PCP TRIAGE) WITHIN 4 HOURS: * IF OFFICE WILL BE CLOSED AND NO PCP (PRIMARY CARE PROVIDER) SECOND-LEVEL TRIAGE: You need to be seen within the next 3 or 4 hours. A nearby Urgent Care Center [facility]) is often a good source of care. Another choice is to go to the ED. Go sooner if you become worse. PAIN MEDICINES: * For pain relief, you can take either acetaminophen, ibuprofen, or naproxen. PLEASE NOTE: All timestamps contained within this report are represented as [place] Standard Time. CONFIDENTIALTY NOTICE: This fax transmission is intended only for the addressee. It contains  information that  is legally privileged, confidential or otherwise protected from use or disclosure. If you are not the intended recipient, you are strictly prohibited from reviewing, disclosing, copying using or disseminating any of this information or taking any action in reliance on or regarding this information. If you have received this fax in error, please notify us immediately by telephone so that we can arrange for its return to [place]. Phone: [phone], Toll-Free: [phone], Fax: [phone] Page: 3 of 3 Call Id: [id number] Comments User: Lynnell Catalan, RN Date/Time Eilene Ghazi Time): 02/09/2022 11:04:47 AM Returned call to patient's wife and notified her that [facility] was called into patient's pharmacy. Also instructed her to follow up with PCP in 24-48 hours if symptoms are not significantly improved. Caller verbalized understanding. Referrals GO TO FACILITY REFUSED Paging DoctorName Phone DateTime Result/ Outcome Message Type Notes Eliezer Lofts - MD [phone] 02/09/2022 10:06:16 AM Paged On Call Back to Call Center Doctor Paged [facility]. Triaged a patient of Dr. Nani Ravens. Please return my call to [phone] please. Sabino Niemann - MD [phone] 02/09/2022 10:27:06 AM Called on Call provider - No message left Doctor Paged Eliezer Lofts - MD 02/09/2022 11:03:41 AM Spoke with On Call - General Message Result OCP calling in [place] to patient's pharmacy. Ensure that caller knows if symptoms are not significantly improved in 24-48 hours, follow up with PCP would be needed

## 2022-02-11 NOTE — Addendum Note (Signed)
Addended by: Sharon Seller B on: 02/11/2022 02:34 PM   Modules accepted: Orders

## 2022-02-11 NOTE — Patient Instructions (Addendum)
Give [place] 2-3 business days to get the results of your labs back.   Continue the colchicine   Foods to AVOID: Red meat, organ meat (liver), lunch meat, seafood (mussels, scallops, anchovies, etc) Alcohol Sugary foods/beverages (diet soft drinks have no link to flares)  Foods to migrate to: Dairy Vegetables Cherries have limited data to suggest they help lower uric acid levels (and prevent flares) Vit C (500 mg daily) may have a modest effect with preventing flares Poultry If you are going to eat red meat, beef and pork may give you less problems than lamb.  Let us know if you need anything.

## 2022-02-11 NOTE — Progress Notes (Signed)
Musculoskeletal Exam  Patient: Raymond Christensen DOB: [birth year]  DOS: 02/11/2022  SUBJECTIVE:  Chief Complaint:   Chief Complaint  Patient presents with   Foot Pain    Big toe on left foot     Raymond Christensen is a 82 y.o.  male for evaluation and treatment of toe pain.   Onset:  3 days ago. No inj or change in activity.  Location: L great toe Character:  aching and sharp  Progression of issue:  is unchanged Associated symptoms: swelling, redness No bruising Treatment: to date has been colchicine Neurovascular symptoms: no +hx of gout.   Past Medical History:  Diagnosis Date   Arthritis    CAD (coronary artery disease)    Carotid artery disease ([place])    s/p left CEA 08/21/54   Chronic systolic CHF (congestive heart failure) ([place])    Concussion Summer 2012   Essential hypertension 02/26/2017   Heart disease    Hyperlipidemia    Hypothyroidism 10/15/2017   Major depressive disorder    Mitral valve regurgitation    Myocardial infarction ([place]) 1996, 2021   Obstructive sleep apnea on CPAP    Persistent atrial fibrillation (HCC)    Poor flexibility of tendon 12/01/2018   S/P mitral valve clip implantation 12/23/2019   s/p TEER with MitraClip with one XTW by Dr. Burt Knack   Subungual hematoma of digit of hand 12/01/2018   Subungual hematoma of right foot 12/01/2018   Thyroid disease    Type 2 diabetes mellitus without complication, without long-term current use of insulin ([place]) 02/26/2017    Objective: VITAL SIGNS: BP 110/82 (BP Location: Right Arm, Patient Position: Sitting, Cuff Size: Normal)   Pulse (!) 101   Temp 97.6 F (36.4 C) (Oral)   Ht '5\' 8"'$  (1.727 m)   Wt 219 lb (99.3 kg)   SpO2 97%   BMI 33.30 kg/m  Constitutional: Well formed, well developed. No acute distress. Thorax & Lungs: No accessory muscle use Musculoskeletal: L great toe.   Tenderness to palpation: yes; over 1st MTP +redness and warmth, slight edema Deformity: no Ecchymosis: no Neurologic:  Normal sensory function. Antalgic gait Psychiatric: Normal mood. Age appropriate judgment and insight. Alert & oriented x 3.    Assessment:  Acute gout involving toe of left foot, unspecified cause - Plan: Uric acid  Plan: Exacerbation of chronic issue. Cont colchicine until finished. Add IM Depomedrol 80 mg right now. Ck Urate levels. Ice, Tylenol.  F/u as originally scheduled. The patient voiced understanding and agreement to the plan.   [place], DO 02/11/22  2:24 PM

## 2022-02-12 LAB — URIC ACID: Uric Acid, Serum: 4.7 mg/dL (ref 4.0–7.8)

## 2022-02-13 NOTE — Therapy (Signed)
OUTPATIENT PHYSICAL THERAPY LOWER EXTREMITY EVALUATION   Patient Name: Raymond Christensen MRN: [id number] DOB:Jul 02, 1940, 82 y.o., male Today's Date: 02/14/2022  END OF SESSION:  PT End of Session - 02/14/22 1148     Visit Number 1    Date for PT Re-Evaluation 05/09/22    Authorization Type Medicare    PT Start Time 1145    PT Stop Time 1230    PT Time Calculation (min) 45 min    Activity Tolerance Patient tolerated treatment well    Behavior During Therapy [facility] for tasks assessed/performed             Past Medical History:  Diagnosis Date   Arthritis    CAD (coronary artery disease)    Carotid artery disease ([place])    s/p left CEA 02/21/72   Chronic systolic CHF (congestive heart failure) ([place])    Concussion Summer 2012   Essential hypertension 02/26/2017   Heart disease    Hyperlipidemia    Hypothyroidism 10/15/2017   Major depressive disorder    Mitral valve regurgitation    Myocardial infarction ([place]) 1996, 2021   Obstructive sleep apnea on CPAP    Persistent atrial fibrillation (HCC)    Poor flexibility of tendon 12/01/2018   S/P mitral valve clip implantation 12/23/2019   s/p TEER with MitraClip with one XTW by Dr. Burt Knack   Subungual hematoma of digit of hand 12/01/2018   Subungual hematoma of right foot 12/01/2018   Thyroid disease    Type 2 diabetes mellitus without complication, without long-term current use of insulin ([place]) 02/26/2017   Past Surgical History:  Procedure Laterality Date   ATRIAL FIBRILLATION ABLATION N/A 02/01/2020   Procedure: ATRIAL FIBRILLATION ABLATION;  Surgeon: [name], MD;  Location: [place] [facility];  Service: Cardiovascular;  Laterality: N/A;   BYPASS GRAFT  2001   CARDIAC CATHETERIZATION     CARDIOVERSION N/A 11/08/2019   Procedure: CARDIOVERSION;  Surgeon: [name], MD;  Location: [place];  Service: Cardiovascular;  Laterality: N/A;   CAROTID ENDARTERECTOMY Left 01/17/2014   CORONARY ANGIOPLASTY      CORONARY ARTERY BYPASS GRAFT     MITRAL VALVE REPAIR N/A 12/23/2019   Procedure: MITRAL VALVE REPAIR;  Surgeon: [name], MD;  Location: [place] [facility];  Service: Cardiovascular;  Laterality: N/A;   RIGHT/LEFT HEART CATH AND CORONARY/GRAFT ANGIOGRAPHY N/A 09/09/2019   Procedure: RIGHT/LEFT HEART CATH AND CORONARY/GRAFT ANGIOGRAPHY;  Surgeon: [name], MD;  Location: [place] [facility];  Service: Cardiovascular;  Laterality: N/A;   TEE WITHOUT CARDIOVERSION N/A 09/13/2019   Procedure: TRANSESOPHAGEAL ECHOCARDIOGRAM (TEE);  Surgeon: [name], MD;  Location[facility];  Service: Cardiovascular;  Laterality: N/A;   TEE WITHOUT CARDIOVERSION N/A 12/23/2019   Procedure: TRANSESOPHAGEAL ECHOCARDIOGRAM (TEE);  Surgeon: [name], MD;  Location: [place] [facility];  Service: Cardiovascular;  Laterality: N/A;   TEE WITHOUT CARDIOVERSION  02/01/2020   Procedure: TRANSESOPHAGEAL ECHOCARDIOGRAM (TEE);  Surgeon: [name], MD;  Location: [place] [facility];  Service: Cardiovascular;;   TOTAL HIP ARTHROPLASTY  1994, 1995   Patient Active Problem List   Diagnosis Date Noted   Lipoma of right upper extremity 03/02/2021   Trigger little finger of left hand 09/29/2020   Secondary hypercoagulable state ([place]) 02/29/2020   Depression, major, single episode, moderate (HCC) 02/28/2020   Nonrheumatic tricuspid valve regurgitation    S/P mitral valve clip implantation 12/23/2019   Mitral valve disease 12/23/2019   Nonrheumatic mitral valve regurgitation  Persistent atrial fibrillation (HCC)    Chronic systolic CHF (congestive heart failure) (HCC)    Acute idiopathic gout involving toe of left foot    Thrombocytopenia (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 ([place])    Diabetes mellitus type 2 in obese ([place]) 06/22/2019   Major depressive disorder    Obstructive sleep apnea on CPAP    Hypothyroidism  10/15/2017   Subclavian artery stenosis, right ([place]) 06/15/2017   Type 2 diabetes mellitus without complication, without long-term current use of insulin ([place]) 02/26/2017   Severe obesity (BMI 35.0-39.9) with comorbidity ([place]) 02/26/2017   Essential hypertension 02/26/2017   Memory change 01/10/2017   Urge incontinence of urine 01/10/2017   Lightheaded 06/25/2016   AK (actinic keratosis) 01/03/2016   H/O syncope 03/06/2014   Occlusion and stenosis of left carotid artery 01/11/2014   Routine history and physical examination of adult 11/08/2013   History of repair of hip joint 04/20/2013   Hyperlipidemia 04/20/2013   Presence of artificial hip joint 04/20/2013   Vitamin D deficiency 06/18/2010   Concussion 01/24/2010   Elevated prostate specific antigen (PSA) 01/01/2007   Benign prostatic hyperplasia without urinary obstruction 04/22/2006   Osteoarthrosis 04/22/2006   Dyslipidemia 04/02/2006    PCP: Riki Sheer  REFERRING PROVIDER: Riki Sheer  REFERRING DIAG: M53.3- Sacroiliac dysfunction  THERAPY DIAG:  Other abnormalities of gait and mobility  Difficulty in walking, not elsewhere classified  Repeated falls  History of falling  Muscle weakness (generalized)  Other lack of coordination  Rationale for Evaluation and Treatment: Rehabilitation  ONSET DATE: 12/26/21  SUBJECTIVE:   SUBJECTIVE STATEMENT: Had a fall in the grocery store and hit his head. Comes in with a big knot and bruising on R side of head. I am having pain on my right side from my head down to my thigh. I am using the walker for now. I also have Gout on my L toe.  PERTINENT HISTORY: Past medical history significant for coronary artery disease, type 2 diabetes mellitus, previous MI, chronic systolic CHF, persistent A-fib  Had a fall on 02/01/22- tripped in a grocery store  PAIN:  Are you having pain? Yes: NPRS scale: 6/10 Pain location: head, shoulder, hip Pain description: sharp if I  press on it other than that it is just dull Aggravating factors: nothing specifically Relieving factors: medicine  PRECAUTIONS: None  WEIGHT BEARING RESTRICTIONS: No  FALLS:  Has patient fallen in last 6 months? Yes. Number of falls 2  LIVING ENVIRONMENT: Lives with: lives with their spouse Lives in: House/apartment Stairs: No Has following equipment at home: Environmental consultant - 2 wheeled  OCCUPATION: Retired  PLOF: Independent  PATIENT GOALS: I want to make sure I can become ambulatory again and get around without the walker  NEXT MD VISIT:   OBJECTIVE:   DIAGNOSTIC FINDINGS:  CT cervical spine:   No evidence of acute fracture or traumatic malalignment.  CT head:   1. No evidence of acute intracranial abnormality. 2. Stable chronic hypodense bilateral subdural collections. 3. Right forehead contusion.  IMPRESSION: Negative.  COGNITION: Overall cognitive status: Within functional limits for tasks assessed, some delay in response and wife reports some memory deficits    SENSATION: WFL   MUSCLE LENGTH: Hamstrings:bilateral tightness, more in LLE  POSTURE: rounded shoulders, forward head, and flexed trunk   PALPATION: Tightness in bilateral UT, more in R side LOWER EXTREMITY ROM: grossly WFL some pain with R side hip flexion  LOWER EXTREMITY MMT: grossly 4/5    FUNCTIONAL TESTS:  5 times sit to stand: 21.31s with UE use Timed up and go (TUG): 22.27s  GAIT: Distance walked: in clinic distances Assistive device utilized: Environmental consultant - 2 wheeled Level of assistance: Modified independence Comments: shuffling feet, decreased stance time bilaterally, decrease stride length, poor foot clearance   TODAY'S TREATMENT:                                                                                                                              DATE: 02/14/22- EVAL    PATIENT EDUCATION:  Education details: POC and HEP Person educated: Patient and Spouse Education method:  Explanation Education comprehension: verbalized understanding  HOME EXERCISE PROGRAM: Access Code: [email or web address] URL: [email or web address] Date: 02/14/2022 Prepared by: Andris Baumann  Exercises - Sit to Stand with Arms Crossed  - 1 x daily - 7 x weekly - 2 sets - 10 reps - Standing March with Counter Support  - 1 x daily - 7 x weekly - 2 sets - 10 reps - Standing Hip Abduction with Unilateral Counter Support  - 1 x daily - 7 x weekly - 2 sets - 10 reps  ASSESSMENT:  CLINICAL IMPRESSION: Patient is a 82 y.o. male who was seen today for physical therapy evaluation and treatment for a fall and post concussion. His order states SIJ dysfunction but he presents as a fall risk and is using a RW to ambulate after his fall on 02/01/22. He has had 2 falls in the last 6 months, both at [employer] and in the same exact spot. This time he fell on his R side and did not break his fall. He presents with increased tremors in both hands and is slow with gait, walks with a shuffling gait and decreased stride length pattern. Patient will benefit from skilled PT to address his functional impairments to meet his goals of being able to walk without a device and be more steady on his feet.  OBJECTIVE IMPAIRMENTS: Abnormal gait, decreased balance, decreased mobility, difficulty walking, decreased strength, and pain.   ACTIVITY LIMITATIONS: bending, stairs, transfers, bed mobility, and locomotion level  PARTICIPATION LIMITATIONS: driving, shopping, and yard work  [employer] POTENTIAL: Good  CLINICAL DECISION MAKING: Stable/uncomplicated  EVALUATION COMPLEXITY: Low   GOALS: Goals reviewed with patient? Yes  SHORT TERM GOALS: Target date: 03/28/22  Patient will be independent with initial HEP. Goal status: INITIAL  2.  Patient will demonstrate decreased fall risk by scoring < 15 sec on TUG. Baseline: 22.27s Goal status: INITIAL  3.  Patient will be educated on strategies to decrease risk of  falls.  Goal status: INITIAL   LONG TERM GOALS: Target date: 05/09/22  Patient will be independent with advanced/ongoing HEP to improve outcomes and carryover.  Goal status: INITIAL  2.  Patient will be able to ambulate 600' with LRAD with good safety to access community.  Baseline: using RW after fall Goal status:  INITIAL  3.  Patient will demonstrate improved functional LE strength as demonstrated by decreased 5xSTS to <14s. Baseline: 21.21s Goal status: INITIAL  4.  Patient will score 46 on Berg Balance test to demonstrate lower risk of falls. (MCID= 8 points) .  Baseline: 38 Goal status: INITIAL   PLAN:  PT FREQUENCY: 1-2x/week  PT DURATION: 12 weeks  PLANNED INTERVENTIONS: Therapeutic exercises, Therapeutic activity, Neuromuscular re-education, Balance training, Gait training, Patient/Family education, Self Care, Joint mobilization, Stair training, Dry Needling, Electrical stimulation, Cryotherapy, Moist heat, Vasopneumatic device, Ionotophoresis '4mg'$ /ml Dexamethasone, and Manual therapy  PLAN FOR NEXT SESSION: start gym activities, work on strength and [employer], PT 02/14/2022, 12:42 PM

## 2022-02-14 ENCOUNTER — Ambulatory Visit: Payer: Medicare (Managed Care) | Attending: Family Medicine

## 2022-02-14 DIAGNOSIS — R296 Repeated falls: Secondary | ICD-10-CM | POA: Insufficient documentation

## 2022-02-14 DIAGNOSIS — R278 Other lack of coordination: Secondary | ICD-10-CM | POA: Diagnosis present

## 2022-02-14 DIAGNOSIS — Z9181 History of falling: Secondary | ICD-10-CM | POA: Diagnosis present

## 2022-02-14 DIAGNOSIS — M533 Sacrococcygeal disorders, not elsewhere classified: Secondary | ICD-10-CM | POA: Insufficient documentation

## 2022-02-14 DIAGNOSIS — M6281 Muscle weakness (generalized): Secondary | ICD-10-CM | POA: Insufficient documentation

## 2022-02-14 DIAGNOSIS — R262 Difficulty in walking, not elsewhere classified: Secondary | ICD-10-CM | POA: Insufficient documentation

## 2022-02-14 DIAGNOSIS — R41844 Frontal lobe and executive function deficit: Secondary | ICD-10-CM | POA: Insufficient documentation

## 2022-02-14 DIAGNOSIS — R41840 Attention and concentration deficit: Secondary | ICD-10-CM | POA: Insufficient documentation

## 2022-02-14 DIAGNOSIS — R2689 Other abnormalities of gait and mobility: Secondary | ICD-10-CM | POA: Diagnosis present

## 2022-02-20 ENCOUNTER — Other Ambulatory Visit: Payer: Self-pay | Admitting: Family Medicine

## 2022-02-20 ENCOUNTER — Telehealth: Payer: Self-pay | Admitting: Family Medicine

## 2022-02-20 DIAGNOSIS — S060X0A Concussion without loss of consciousness, initial encounter: Secondary | ICD-10-CM

## 2022-02-20 NOTE — Telephone Encounter (Signed)
Pt stated he would like a referral to neurology to make sure everything is okay given his recent concussion.

## 2022-02-20 NOTE — Telephone Encounter (Signed)
Referral done

## 2022-02-25 ENCOUNTER — Ambulatory Visit
Admission: RE | Admit: 2022-02-25 | Discharge: 2022-02-25 | Disposition: A | Discharge status: Home / Self Care | Payer: Medicare (Managed Care) | Source: Ambulatory Visit | Attending: Neurology | Admitting: Neurology

## 2022-02-25 ENCOUNTER — Telehealth: Payer: Self-pay | Admitting: Neurology

## 2022-02-25 ENCOUNTER — Ambulatory Visit: Payer: Medicare (Managed Care) | Admitting: Neurology

## 2022-02-25 ENCOUNTER — Encounter: Payer: Self-pay | Admitting: Neurology

## 2022-02-25 VITALS — BP 110/66 | HR 63 | Ht 68.0 in | Wt 213.0 lb

## 2022-02-25 DIAGNOSIS — I6200 Nontraumatic subdural hemorrhage, unspecified: Secondary | ICD-10-CM

## 2022-02-25 DIAGNOSIS — W19XXXD Unspecified fall, subsequent encounter: Secondary | ICD-10-CM

## 2022-02-25 DIAGNOSIS — G935 Compression of brain: Secondary | ICD-10-CM | POA: Diagnosis not present

## 2022-02-25 DIAGNOSIS — D181 Lymphangioma, any site: Secondary | ICD-10-CM

## 2022-02-25 NOTE — Patient Instructions (Addendum)
Continue current medications  Referral to Neurosurgery to further evaluation and management of increase size fo Hygromas and mass effect  Continue to follow up with Dr. Nani Ravens  Return in 6 months or sooner if worse

## 2022-02-25 NOTE — Progress Notes (Signed)
[facility]  PATIENT: Raymond Christensen DOB: March 04, 1940  REQUESTING CLINICIAN: [name]* HISTORY FROM: Patient and spouse  REASON FOR VISIT: Recent fall, c/o brain fog, generalized weakness    HISTORICAL  CHIEF COMPLAINT:  Chief Complaint  Patient presents with   New Patient (Initial Visit)    Rm 12 NP internal referral for Concussion without loss of consciousness, initial encounter C/o daily headaches and brain fog    HISTORY OF PRESENT ILLNESS:  This is an 82 year old gentleman with past medical history of atrial fibrillation on Eliquis, hypertension, hypothyroidism, hyperlipidemia depression who is presenting after 2 falls.  Patient had his first fall in September 17 and the last one on January 19.  He reports that on both falls, he was at the grocery store, [employer], felt like that he tripped and fell.  On his last fall on the 19 he was taken to the hospital and his head CT was negative for any worsening bleed but showed the bilateral hygromas.  He was discharged home but reported 2 days later he started having vomiting and headache.  He did not present to the hospital after experiencing the vomiting.  The vomiting lasted for 2 days.  He did follow-up with his PCP who referred him to neurology for further management of his symptoms concussion.  Currently his main complaint is generalized weakness, brain fog difficulty with concentration.  Denies any focal weakness.  He does use a walker for ambulation.   OTHER MEDICAL CONDITIONS: Atrial fibrillation on Eliquis, Hypothyroidism, hypertension, hyperlipidemia, depression    REVIEW OF SYSTEMS: Full 14 system review of systems performed and negative with exception of: As noted in the HPI   ALLERGIES: Allergies  Allergen Reactions   Sulfa Antibiotics Rash    Questionable, he developed a diffuse rash 2 days after stopping Bactrim    HOME MEDICATIONS: Outpatient Medications Prior to Visit   Medication Sig Dispense Refill   acetaminophen (TYLENOL) 500 MG tablet Take 1,000 mg by mouth every 6 (six) hours as needed for moderate pain or headache.     ARIPiprazole (ABILIFY) 5 MG tablet Take 5 mg by mouth daily.     atorvastatin (LIPITOR) 80 MG tablet TAKE 1 TABLET DAILY 90 tablet 2   Cholecalciferol (VITAMIN D3) 250 MCG (10000 UT) capsule Take 10,000 Units by mouth daily.      ELIQUIS 5 MG TABS tablet TAKE 1 TABLET TWICE A DAY 180 tablet 3   ENTRESTO 24-26 MG TAKE 1 TABLET TWICE A DAY 180 tablet 3   ezetimibe (ZETIA) 10 MG tablet TAKE 1 TABLET DAILY 90 tablet 3   famotidine (PEPCID) 20 MG tablet Take 1 tablet (20 mg total) by mouth daily. 30 tablet 3   fenofibrate (TRICOR) 145 MG tablet TAKE ONE TABLET BY MOUTH DAILY 90 tablet 3   furosemide (LASIX) 40 MG tablet Take 1 tablet (40 mg total) by mouth daily. 90 tablet 1   levocetirizine (XYZAL) 5 MG tablet TAKE 1 TABLET EVERY EVENING 90 tablet 1   levothyroxine (SYNTHROID) 75 MCG tablet Take 1 tablet (75 mcg total) by mouth daily. 90 tablet 1   liothyronine (CYTOMEL) 5 MCG tablet TAKE 2 TABLETS DAILY 180 tablet 3   Multiple Vitamins-Minerals (MULTIVITAMIN ADULT EXTRA C PO) Take 1 tablet by mouth daily.      OXcarbazepine (TRILEPTAL) 150 MG tablet Take 150 mg by mouth 2 (two) times daily.     potassium chloride (KLOR-CON) 10 MEQ tablet TAKE 2 TABLETS DAILY AS  NEEDED WITH EVERY 40MG     DOSE OF LASIX. 180 tablet 1   RSV vaccine recomb adjuvanted (AREXVY) 120 MCG/0.5ML injection Inject into the muscle. 1 each 0   Semaglutide, 1 MG/DOSE, 4 MG/3ML SOPN Inject 1 mg as directed once a week. 9 mL 2   sertraline (ZOLOFT) 100 MG tablet Take 100 mg by mouth daily.     spironolactone (ALDACTONE) 25 MG tablet Take 0.5 tablets (12.5 mg total) by mouth daily. 45 tablet 2   tiZANidine (ZANAFLEX) 4 MG tablet Take 1 tablet (4 mg total) by mouth every 6 (six) hours as needed for muscle spasms. 30 tablet 0   zinc gluconate 50 MG tablet Take 50 mg by  mouth daily.     colchicine 0.6 MG tablet Take 1 tablet x 1 , may repeat in 1 hour, then take 1 tablet daily until symptoms resolved. 10 tablet 0   nitroGLYCERIN (NITROSTAT) 0.4 MG SL tablet Place 1 tablet (0.4 mg total) under the tongue every 5 (five) minutes as needed for chest pain. 30 tablet 1   vitamin B-12 (CYANOCOBALAMIN) 1000 MCG tablet Take 1,000 mcg by mouth daily.     No facility-administered medications prior to visit.    PAST MEDICAL HISTORY: Past Medical History:  Diagnosis Date   Arthritis    CAD (coronary artery disease)    Carotid artery disease ([place])    s/p left CEA [id number]   Chronic systolic CHF (congestive heart failure) ([place])    Concussion Summer 2012   Essential hypertension 02/26/2017   Heart disease    Hyperlipidemia    Hypothyroidism 10/15/2017   Major depressive disorder    Mitral valve regurgitation    Myocardial infarction ([place]) 1996, 2021   Obstructive sleep apnea on CPAP    Persistent atrial fibrillation ([facility])    Poor flexibility of tendon 12/01/2018   S/P mitral valve clip implantation 12/23/2019   s/p TEER with MitraClip with one XTW by Dr. Burt Knack   Subungual hematoma of digit of hand 12/01/2018   Subungual hematoma of right foot 12/01/2018   Thyroid disease    Type 2 diabetes mellitus without complication, without long-term current use of insulin ([place]) 02/26/2017    PAST SURGICAL HISTORY: Past Surgical History:  Procedure Laterality Date   ATRIAL FIBRILLATION ABLATION N/A 02/01/2020   Procedure: ATRIAL FIBRILLATION ABLATION;  Surgeon: [name], MD;  Location: [facility];  Service: Cardiovascular;  Laterality: N/A;   BYPASS GRAFT  2001   CARDIAC CATHETERIZATION     CARDIOVERSION N/A 11/08/2019   Procedure: CARDIOVERSION;  Surgeon: [name], MD;  Location: [facility];  Service: Cardiovascular;  Laterality: N/A;   CAROTID ENDARTERECTOMY Left 01/17/2014   CORONARY ANGIOPLASTY     CORONARY ARTERY BYPASS GRAFT      MITRAL VALVE REPAIR N/A 12/23/2019   Procedure: MITRAL VALVE REPAIR;  Surgeon: [name], MD;  Location: [facility];  Service: Cardiovascular;  Laterality: N/A;   RIGHT/LEFT HEART CATH AND CORONARY/GRAFT ANGIOGRAPHY N/A 09/09/2019   Procedure: RIGHT/LEFT HEART CATH AND CORONARY/GRAFT ANGIOGRAPHY;  Surgeon: [name], MD;  Location: [place] [facility];  Service: Cardiovascular;  Laterality: N/A;   TEE WITHOUT CARDIOVERSION N/A 09/13/2019   Procedure: TRANSESOPHAGEAL ECHOCARDIOGRAM (TEE);  Surgeon: [name], MD;  Location: [facility];  Service: Cardiovascular;  Laterality: N/A;   TEE WITHOUT CARDIOVERSION N/A 12/23/2019   Procedure: TRANSESOPHAGEAL ECHOCARDIOGRAM (TEE);  Surgeon: [name], MD;  Location: [facility];  Service: Cardiovascular;  Laterality: N/A;   TEE WITHOUT CARDIOVERSION  02/01/2020   Procedure: TRANSESOPHAGEAL ECHOCARDIOGRAM (TEE);  Surgeon: [name], MD;  Location: [facility];  Service: Cardiovascular;;   TOTAL HIP ARTHROPLASTY  1994, 1995    FAMILY HISTORY: Family History  Problem Relation Age of Onset   Dementia Father        Concerns surrounding Lewy body dementia, but never officially diagnosed   Cancer Neg Hx     SOCIAL HISTORY: Social History   Socioeconomic History   Marital status: Married    Spouse name: Not on file   Number of children: 2   Years of education: 98   Highest education level: Doctorate  Occupational History   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  Tobacco Use   Smoking status: Former    Types: Cigarettes    Quit date: 1980    Years since quitting: 44.1   Smokeless tobacco: Never  Vaping Use   Vaping Use: Never used  Substance and Sexual Activity   Alcohol use: Yes    Alcohol/week: 4.0 standard drinks of alcohol    Types: 1 Cans of beer, 3 Standard drinks or equivalent per week    Comment: 1 drink 3 times per week   Drug use: No   Sexual activity: Yes  Other Topics Concern   Not on  file  Social History Narrative   Pt is R handed   Lives in single story home with his wife, Arbie Cookey   Has 2 adult children   PhD in Biology   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 [employer]    Social Determinants of Health   Financial Resource Strain: [place]  (07/27/2021)   Overall Financial Resource Strain (CARDIA)    Difficulty of Paying Living Expenses: Not hard at all  Food Insecurity: No Food Insecurity (07/27/2021)   Hunger Vital Sign    Worried About Running Out of Food in the Last Year: Never true    [address] in the Last Year: Never true  Transportation Needs: No Transportation Needs (07/27/2021)   PRAPARE - Hydrologist (Medical): No    Lack of Transportation (Non-Medical): No  Physical Activity: Sufficiently Active (07/27/2021)   Exercise Vital Sign    Days of Exercise per Week: 7 days    Minutes of Exercise per Session: 30 min  Stress: No Stress Concern Present (07/27/2021)   [place]    Feeling of Stress : Not at all  Social Connections: [place] (07/27/2021)   Social Connection and Isolation Panel [NHANES]    Frequency of Communication with Friends and Family: More than three times a week    Frequency of Social Gatherings with Friends and Family: More than three times a week    Attends Religious Services: More than 4 times per year    Active Member of [employer] or Organizations: Yes    Attends Archivist Meetings: More than 4 times per year    Marital Status: Married  Human resources officer Violence: Not At Risk (07/27/2021)   Humiliation, Afraid, Rape, and Kick questionnaire    Fear of Current or Ex-Partner: No    Emotionally Abused: No    Physically Abused: No    Sexually Abused: No    PHYSICAL EXAM  GENERAL EXAM/CONSTITUTIONAL: Vitals:  Vitals:   02/25/22 1024  BP: 110/66  Pulse: 63  Weight: 213 lb (96.6 kg)  Height: 5' 8"$  (1.727 m)  Body  mass index is 32.39 kg/m. Wt Readings from Last 3 Encounters:  02/25/22 213 lb (96.6 kg)  02/11/22 219 lb (99.3 kg)  12/26/21 230 lb 8 oz (104.6 kg)   Patient is in no distress; well developed, nourished and groomed; neck is supple  EYES: Visual fields full to confrontation, Extraocular movements intacts,   MUSCULOSKELETAL: Gait, strength, tone, movements noted in Neurologic exam below  NEUROLOGIC: MENTAL STATUS:     01/12/2018    3:20 PM  MMSE - Mini Mental State Exam  Orientation to time 5  Orientation to Place 5  Registration 3  Attention/ Calculation 5  Recall 2  Language- name 2 objects 2  Language- repeat 1  Language- follow 3 step command 3  Language- read & follow direction 1  Write a sentence 1  Copy design 1  Total score 29   awake, alert, oriented to person, place and time recent and remote memory intact normal attention and concentration language fluent, comprehension intact, naming intact fund of knowledge appropriate  CRANIAL NERVE:  2nd, 3rd, 4th, 6th - Visual fields full to confrontation, extraocular muscles intact, no nystagmus 5th - facial sensation symmetric 7th - facial strength symmetric 8th - hearing intact 9th - palate elevates symmetrically, uvula midline 11th - shoulder shrug symmetric 12th - tongue protrusion midline  MOTOR:  normal bulk and tone, full strength in the BUE, BLE  SENSORY:  normal and symmetric to light touch  COORDINATION:  finger-nose-finger, fine finger movements normal  REFLEXES:  deep tendon reflexes present and symmetric  GAIT/STATION:  Uses a walker    DIAGNOSTIC DATA (LABS, IMAGING, TESTING) - I reviewed patient records, labs, notes, testing and imaging myself where available.  Lab Results  Component Value Date   WBC 8.9 09/30/2021   HGB 15.0 09/30/2021   HCT 44.0 09/30/2021   MCV 88.1 09/30/2021   PLT 178 09/30/2021      Component Value Date/Time   NA 142 09/30/2021 1512   NA 142 02/20/2021  1145   K 4.2 09/30/2021 1512   CL 105 09/30/2021 1512   CO2 26 09/30/2021 1500   GLUCOSE 97 09/30/2021 1512   BUN 18 09/30/2021 1512   BUN 20 02/20/2021 1145   CREATININE 1.20 09/30/2021 1512   CALCIUM 9.5 09/30/2021 1500   PROT 7.1 09/30/2021 1500   PROT 6.7 09/30/2019 1247   ALBUMIN 4.1 09/30/2021 1500   ALBUMIN 4.4 09/30/2019 1247   AST 36 09/30/2021 1500   ALT 26 09/30/2021 1500   ALKPHOS 61 09/30/2021 1500   BILITOT 0.8 09/30/2021 1500   BILITOT 0.6 09/30/2019 1247   GFRNONAA >60 09/30/2021 1500   GFRAA 49 (L) 01/13/2020 1016   Lab Results  Component Value Date   CHOL 113 10/08/2021   HDL 35.10 (L) 10/08/2021   LDLCALC 45 10/08/2021   LDLDIRECT 65.0 08/31/2021   TRIG 164.0 (H) 10/08/2021   CHOLHDL 3 10/08/2021   Lab Results  Component Value Date   HGBA1C 6.4 08/31/2021   Lab Results  Component Value Date   VITAMINB12 1,659 (H) 09/07/2019   Lab Results  Component Value Date   TSH 1.89 08/31/2021    CT head 02/01/22: 1. No evidence of acute intracranial abnormality. 2. Stable chronic hypodense bilateral subdural collections. 3. Right forehead contusion.    CT cervical spine 02/01/22: No evidence of acute fracture or traumatic malalignment   Head CT 02/25/2022 Bilateral hemispheric hygromas predominantly over the frontal convexities measuring 14 mm in maximum diameter on  the right and 11 mm on the left.  The size of the fluid collection on the right has increased in size compared to the 02/01/2022 and especially compared to the 09/30/2021 CT scan.  Frontal lobe sulci are flattened, a little more on the right consistent with some mass effect.  This has also progressed compared to the January CT scan Right forehead scalp hematoma.   ASSESSMENT AND PLAN  82 y.o. year old male with atrial fibrillation on Eliquis, hypertension, hyperlipidemia, hypothyroidism who is presenting after his last fall in January 19.  Two days after the fall, he experienced vomiting for 2  days.  He is coming in today with complaint of brain fog and generalized weakness.  On exam there was no focal weakness.  His repeat CT scan of the head completed today showed increased size of the hygroma on the right when compared to the CT scan done on January 19.  There is also evidence of mass effect.  He remains on Eliquis.  I will refer him for a urgent surgical consultation.  I will see him in 6 months for follow-up.  Continue to follow with Dr. Nani Ravens.   1. Fall, subsequent encounter   2. Subdural hemorrhage ([place])   3. Hygroma   4. Brain compression [facility])      Patient Instructions  Continue current medications  Referral to Neurosurgery to further evaluation and management of increase size fo Hygromas and mass effect  Continue to follow up with Dr. Nani Ravens  Return in 6 months or sooner if worse   Orders Placed This Encounter  Procedures   CT HEAD WO CONTRAST (5MM)   Ambulatory referral to Neurosurgery    No orders of the defined types were placed in this encounter.   Return in 6 months (on 08/26/2022).    Alric Ran, MD 02/25/2022, 5:15 PM  [facility] [address], [place], [place] [address] [phone]

## 2022-02-25 NOTE — Telephone Encounter (Signed)
[employer] NPR case [id number] sent to GI for stat walk in [phone]

## 2022-02-26 ENCOUNTER — Telehealth: Payer: Self-pay | Admitting: Neurology

## 2022-02-26 ENCOUNTER — Encounter: Payer: Self-pay | Admitting: Neurology

## 2022-02-26 ENCOUNTER — Encounter: Payer: Self-pay | Admitting: Family Medicine

## 2022-02-26 ENCOUNTER — Ambulatory Visit: Payer: Medicare (Managed Care) | Admitting: Physical Therapy

## 2022-02-26 ENCOUNTER — Encounter: Payer: Self-pay | Admitting: Physical Therapy

## 2022-02-26 DIAGNOSIS — M6281 Muscle weakness (generalized): Secondary | ICD-10-CM

## 2022-02-26 DIAGNOSIS — R296 Repeated falls: Secondary | ICD-10-CM

## 2022-02-26 DIAGNOSIS — R278 Other lack of coordination: Secondary | ICD-10-CM

## 2022-02-26 DIAGNOSIS — Z9181 History of falling: Secondary | ICD-10-CM

## 2022-02-26 DIAGNOSIS — R2689 Other abnormalities of gait and mobility: Secondary | ICD-10-CM

## 2022-02-26 DIAGNOSIS — R262 Difficulty in walking, not elsewhere classified: Secondary | ICD-10-CM

## 2022-02-26 NOTE — Telephone Encounter (Signed)
Urgent referral sent to [facility], phone # [phone].

## 2022-02-26 NOTE — Therapy (Signed)
OUTPATIENT PHYSICAL THERAPY LOWER EXTREMITY EVALUATION   Patient Name: Raymond Christensen MRN: [id number] DOB:Dec 20, 1940, 82 y.o., male Today's Date: 02/26/2022  END OF SESSION:  PT End of Session - 02/26/22 1322     Visit Number 2    Date for PT Re-Evaluation 05/09/22    PT Start Time [id number]    PT Stop Time [id number]    PT Time Calculation (min) 41 min    Activity Tolerance Patient tolerated treatment well    Behavior During Therapy [facility] for tasks assessed/performed              Past Medical History:  Diagnosis Date   Arthritis    CAD (coronary artery disease)    Carotid artery disease ([place])    s/p left CEA [id number]   Chronic systolic CHF (congestive heart failure) ([place])    Concussion Summer 2012   Essential hypertension 02/26/2017   Heart disease    Hyperlipidemia    Hypothyroidism 10/15/2017   Major depressive disorder    Mitral valve regurgitation    Myocardial infarction ([place]) 1996, 2021   Obstructive sleep apnea on CPAP    Persistent atrial fibrillation (HCC)    Poor flexibility of tendon 12/01/2018   S/P mitral valve clip implantation 12/23/2019   s/p TEER with MitraClip with one XTW by Dr. Burt Knack   Subungual hematoma of digit of hand 12/01/2018   Subungual hematoma of right foot 12/01/2018   Thyroid disease    Type 2 diabetes mellitus without complication, without long-term current use of insulin ([place]) 02/26/2017   Past Surgical History:  Procedure Laterality Date   ATRIAL FIBRILLATION ABLATION N/A 02/01/2020   Procedure: ATRIAL FIBRILLATION ABLATION;  Surgeon: [name], MD;  Location: [place] [facility];  Service: Cardiovascular;  Laterality: N/A;   BYPASS GRAFT  2001   CARDIAC CATHETERIZATION     CARDIOVERSION N/A 11/08/2019   Procedure: CARDIOVERSION;  Surgeon: [name], MD;  Location: [place];  Service: Cardiovascular;  Laterality: N/A;   CAROTID ENDARTERECTOMY Left 01/17/2014   CORONARY ANGIOPLASTY     CORONARY ARTERY BYPASS GRAFT      MITRAL VALVE REPAIR N/A 12/23/2019   Procedure: MITRAL VALVE REPAIR;  Surgeon: [name], MD;  Location: [place] [facility];  Service: Cardiovascular;  Laterality: N/A;   RIGHT/LEFT HEART CATH AND CORONARY/GRAFT ANGIOGRAPHY N/A 09/09/2019   Procedure: RIGHT/LEFT HEART CATH AND CORONARY/GRAFT ANGIOGRAPHY;  Surgeon: [name], MD;  Location: [place] [facility];  Service: Cardiovascular;  Laterality: N/A;   TEE WITHOUT CARDIOVERSION N/A 09/13/2019   Procedure: TRANSESOPHAGEAL ECHOCARDIOGRAM (TEE);  Surgeon: [name], MD;  Location: [facility];  Service: Cardiovascular;  Laterality: N/A;   TEE WITHOUT CARDIOVERSION N/A 12/23/2019   Procedure: TRANSESOPHAGEAL ECHOCARDIOGRAM (TEE);  Surgeon: [name], MD;  Location: [place] [facility];  Service: Cardiovascular;  Laterality: N/A;   TEE WITHOUT CARDIOVERSION  02/01/2020   Procedure: TRANSESOPHAGEAL ECHOCARDIOGRAM (TEE);  Surgeon: [name], MD;  Location: [place] [facility];  Service: Cardiovascular;;   TOTAL HIP ARTHROPLASTY  1994, 1995   Patient Active Problem List   Diagnosis Date Noted   Lipoma of right upper extremity 03/02/2021   Trigger little finger of left hand 09/29/2020   Secondary hypercoagulable state ([place]) 02/29/2020   Depression, major, single episode, moderate (HCC) 02/28/2020   Nonrheumatic tricuspid valve regurgitation    S/P mitral valve clip implantation 12/23/2019   Mitral valve disease 12/23/2019   Nonrheumatic mitral valve regurgitation    Persistent atrial  fibrillation (HCC)    Chronic systolic CHF (congestive heart failure) (HCC)    Acute idiopathic gout involving toe of left foot    Thrombocytopenia ([place]) 09/08/2019   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 ([place])    Diabetes mellitus type 2 in obese ([place]) 06/22/2019   Major depressive disorder    Obstructive sleep apnea on CPAP    Hypothyroidism 10/15/2017   Subclavian artery  stenosis, right ([place]) 06/15/2017   Type 2 diabetes mellitus without complication, without long-term current use of insulin ([place]) 02/26/2017   Severe obesity (BMI 35.0-39.9) with comorbidity ([place]) 02/26/2017   Essential hypertension 02/26/2017   Memory change 01/10/2017   Urge incontinence of urine 01/10/2017   Lightheaded 06/25/2016   AK (actinic keratosis) 01/03/2016   H/O syncope 03/06/2014   Occlusion and stenosis of left carotid artery 01/11/2014   Routine history and physical examination of adult 11/08/2013   History of repair of hip joint 04/20/2013   Hyperlipidemia 04/20/2013   Presence of artificial hip joint 04/20/2013   Vitamin D deficiency 06/18/2010   Concussion 01/24/2010   Elevated prostate specific antigen (PSA) 01/01/2007   Benign prostatic hyperplasia without urinary obstruction 04/22/2006   Osteoarthrosis 04/22/2006   Dyslipidemia 04/02/2006    PCP: Riki Sheer  REFERRING PROVIDER: Riki Sheer  REFERRING DIAG: M53.3- Sacroiliac dysfunction  THERAPY DIAG:  Other abnormalities of gait and mobility  Difficulty in walking, not elsewhere classified  Repeated falls  Other lack of coordination  Muscle weakness (generalized)  History of falling  Rationale for Evaluation and Treatment: Rehabilitation  ONSET DATE: 12/26/21  SUBJECTIVE:   SUBJECTIVE STATEMENT: Patient reports no new issues. He is performing HEP, finds it appropriately challenging. His Gout feels improved.  PERTINENT HISTORY: Past medical history significant for coronary artery disease, type 2 diabetes mellitus, previous MI, chronic systolic CHF, persistent A-fib  Had a fall on 02/01/22- tripped in a grocery store  PAIN:  Are you having pain? Yes: NPRS scale: 6/10 Pain location: head, shoulder, hip Pain description: sharp if I press on it other than that it is just dull Aggravating factors: nothing specifically Relieving factors: medicine  PRECAUTIONS: None  WEIGHT  BEARING RESTRICTIONS: No  FALLS:  Has patient fallen in last 6 months? Yes. Number of falls 2  LIVING ENVIRONMENT: Lives with: lives with their spouse Lives in: House/apartment Stairs: No Has following equipment at home: Environmental consultant - 2 wheeled  OCCUPATION: Retired  PLOF: Independent  PATIENT GOALS: I want to make sure I can become ambulatory again and get around without the walker  NEXT MD VISIT:   OBJECTIVE:   DIAGNOSTIC FINDINGS:  CT cervical spine:   No evidence of acute fracture or traumatic malalignment.  CT head:   1. No evidence of acute intracranial abnormality. 2. Stable chronic hypodense bilateral subdural collections. 3. Right forehead contusion.  IMPRESSION: Negative.  COGNITION: Overall cognitive status: Within functional limits for tasks assessed, some delay in response and wife reports some memory deficits    SENSATION: WFL   MUSCLE LENGTH: Hamstrings:bilateral tightness, more in LLE  POSTURE: rounded shoulders, forward head, and flexed trunk   PALPATION: Tightness in bilateral UT, more in R side LOWER EXTREMITY ROM: grossly WFL some pain with R side hip flexion    LOWER EXTREMITY MMT: grossly 4/5    FUNCTIONAL TESTS:  5 times sit to stand: 21.31s with UE use Timed up and go (TUG): 22.27s  GAIT: Distance walked: in clinic distances Assistive device utilized:  Walker - 2 wheeled Level of assistance: Modified independence Comments: shuffling feet, decreased stance time bilaterally, decrease stride length, poor foot clearance   TODAY'S TREATMENT:                                                                                                                              DATE:  02/26/22 Bike L2 x 5 minutes Supine DKTC over physioball x 5, hold 5 sec Supine bridge over physioball, repeat with B hip IR, 10 each Seated clamshells and active add for lateral hip strengthening and stretch x 10, G tband. Sit to stqnd on Air ex pad, holding 2# ball  in BUE with OHP at top. Min TC and VC to lean forward sufficiently to balance. 10 reps. B side to side stepping against G Tband resistance, B HHA, 2 x 10 reps each direction. B side stepping over dowels on the floor. Heel raises on black bar, 20 reps, BUE support for balance. Ambulation, walking x approximately 200', including multiple turns, avoiding obstacles, backwards stepping. No AD, no unsteadiness noted.  02/14/22- EVAL    PATIENT EDUCATION:  Education details: POC and HEP Person educated: Patient and Spouse Education method: Explanation Education comprehension: verbalized understanding  HOME EXERCISE PROGRAM: Access Code: [email or web address] URL: [email or web address] Date: 02/14/2022 Prepared by: Andris Baumann  Exercises - Sit to Stand with Arms Crossed  - 1 x daily - 7 x weekly - 2 sets - 10 reps - Standing March with Counter Support  - 1 x daily - 7 x weekly - 2 sets - 10 reps - Standing Hip Abduction with Unilateral Counter Support  - 1 x daily - 7 x weekly - 2 sets - 10 reps  ASSESSMENT:  CLINICAL IMPRESSION: Patient reports no issues today, performing HEP. Treatment opened with strengthening and stabilization to trunk and BLE, then progressed into functional strengthening and balance training with speed of movement, changing directions. He tolerated well, required HHA and occasional min A for unsteadiness.  OBJECTIVE IMPAIRMENTS: Abnormal gait, decreased balance, decreased mobility, difficulty walking, decreased strength, and pain.   ACTIVITY LIMITATIONS: bending, stairs, transfers, bed mobility, and locomotion level  PARTICIPATION LIMITATIONS: driving, shopping, and yard work  [employer] POTENTIAL: Good  CLINICAL DECISION MAKING: Stable/uncomplicated  EVALUATION COMPLEXITY: Low   GOALS: Goals reviewed with patient? Yes  SHORT TERM GOALS: Target date: 03/28/22  Patient will be independent with initial HEP. Goal status: INITIAL  2.  Patient will demonstrate  decreased fall risk by scoring < 15 sec on TUG. Baseline: 22.27s Goal status: INITIAL  3.  Patient will be educated on strategies to decrease risk of falls.  Goal status: INITIAL   LONG TERM GOALS: Target date: 05/09/22  Patient will be independent with advanced/ongoing HEP to improve outcomes and carryover.  Goal status: INITIAL  2.  Patient will be able to ambulate 600' with LRAD with good safety to access community.  Baseline: using RW after fall Goal status: INITIAL  3.  Patient will demonstrate improved functional LE strength as demonstrated by decreased 5xSTS to <14s. Baseline: 21.21s Goal status: INITIAL  4.  Patient will score 46 on Berg Balance test to demonstrate lower risk of falls. (MCID= 8 points) .  Baseline: 38 Goal status: INITIAL   PLAN:  PT FREQUENCY: 1-2x/week  PT DURATION: 12 weeks  PLANNED INTERVENTIONS: Therapeutic exercises, Therapeutic activity, Neuromuscular re-education, Balance training, Gait training, Patient/Family education, Self Care, Joint mobilization, Stair training, Dry Needling, Electrical stimulation, Cryotherapy, Moist heat, Vasopneumatic device, Ionotophoresis 33m/ml Dexamethasone, and Manual therapy  PLAN FOR NEXT SESSION: start gym activities, work on strength and balance   SMarcelina Morel DPT 02/26/2022, 2:00 PM

## 2022-02-26 NOTE — Telephone Encounter (Signed)
I was on the phone with the pt when this message was sent to our pod. pt and I spoke about his CT Scan and the reasoning for the neurosurgery consult. He is agreeable to this plan but has been advised by neurosurgery office his insurance is out of network.  Pt is going to call his insurance and see if a local neurosurgery department is within his insurance net work. He will call back as soon as he knows more.

## 2022-02-26 NOTE — Telephone Encounter (Signed)
At 9:00 this morning pt left a vm asking to be called with results to his CT scan

## 2022-02-26 NOTE — Telephone Encounter (Signed)
At 12:59 pt left a vm stating that he received a call from the office re: setting an appointment , he also mentioned it not being covered by his insurance, please call pt to discuss this type appointment

## 2022-02-26 NOTE — Telephone Encounter (Signed)
Raymond Christensen from [facility] called me, they do not accept pt's insurance. He has a [employer] out of [place]. She stated she told the patient and he was going to be calling insurance to find out where we can send him. Pt should be calling us with info.

## 2022-02-26 NOTE — Telephone Encounter (Signed)
I called pt and spoke with him about his CT Scan and the reasoning for the neurosurgery consult. He is agreeable to this plan but has been advised by neurosurgery office his insurance is out of network.  Pt is going to call his insurance and see if a local neurosurgery department is within his insurance net work. He will call back as soon as he knows more.

## 2022-02-26 NOTE — Telephone Encounter (Signed)
He called his insurance and was told that they will allow  one time consultation. He will call our referral department tomorrow.

## 2022-02-26 NOTE — Telephone Encounter (Signed)
Pt is calling. Wanting to know why a neurosurgeon is calling him to make an appointment. Pt is requesting a call back from nurse.

## 2022-02-27 NOTE — Telephone Encounter (Signed)
Pt called in, I asked phone staff to give him [facility]'s # so he could call them to discuss scheduling appt.

## 2022-02-27 NOTE — Telephone Encounter (Signed)
I reached out to the pt via mychart. I advised to schedule with the next available appt.

## 2022-02-27 NOTE — Telephone Encounter (Signed)
Pt has called back asking for a call to discuss just how soon/urgent Dr April Manson feels pt needs to see the Neurosurgeon, please call.

## 2022-02-28 NOTE — Therapy (Signed)
OUTPATIENT PHYSICAL THERAPY LOWER EXTREMITY TREATMENT   Patient Name: Raymond Christensen MRN: [id number] DOB:12-04-1940, 82 y.o., male Today's Date: 03/01/2022  END OF SESSION:  PT End of Session - 03/01/22 1013     Visit Number 3    Date for PT Re-Evaluation 05/09/22    PT Start Time 1015    PT Stop Time 1100    PT Time Calculation (min) 45 min    Activity Tolerance Patient tolerated treatment well    Behavior During Therapy [facility] for tasks assessed/performed              Past Medical History:  Diagnosis Date   Arthritis    CAD (coronary artery disease)    Carotid artery disease ([place])    s/p left CEA 123456   Chronic systolic CHF (congestive heart failure) ([place])    Concussion Summer 2012   Essential hypertension 02/26/2017   Heart disease    Hyperlipidemia    Hypothyroidism 10/15/2017   Major depressive disorder    Mitral valve regurgitation    Myocardial infarction ([place]) 1996, 2021   Obstructive sleep apnea on CPAP    Persistent atrial fibrillation (HCC)    Poor flexibility of tendon 12/01/2018   S/P mitral valve clip implantation 12/23/2019   s/p TEER with MitraClip with one XTW by Dr. Burt Knack   Subungual hematoma of digit of hand 12/01/2018   Subungual hematoma of right foot 12/01/2018   Thyroid disease    Type 2 diabetes mellitus without complication, without long-term current use of insulin ([place]) 02/26/2017   Past Surgical History:  Procedure Laterality Date   ATRIAL FIBRILLATION ABLATION N/A 02/01/2020   Procedure: ATRIAL FIBRILLATION ABLATION;  Surgeon: [name], MD;  Location: [place] [facility];  Service: Cardiovascular;  Laterality: N/A;   BYPASS GRAFT  2001   CARDIAC CATHETERIZATION     CARDIOVERSION N/A 11/08/2019   Procedure: CARDIOVERSION;  Surgeon: [name], MD;  Location: [place];  Service: Cardiovascular;  Laterality: N/A;   CAROTID ENDARTERECTOMY Left 01/17/2014   CORONARY ANGIOPLASTY     CORONARY ARTERY BYPASS GRAFT      MITRAL VALVE REPAIR N/A 12/23/2019   Procedure: MITRAL VALVE REPAIR;  Surgeon: [name], MD;  Location: [place] [facility];  Service: Cardiovascular;  Laterality: N/A;   RIGHT/LEFT HEART CATH AND CORONARY/GRAFT ANGIOGRAPHY N/A 09/09/2019   Procedure: RIGHT/LEFT HEART CATH AND CORONARY/GRAFT ANGIOGRAPHY;  Surgeon: [name], MD;  Location: [place] [facility];  Service: Cardiovascular;  Laterality: N/A;   TEE WITHOUT CARDIOVERSION N/A 09/13/2019   Procedure: TRANSESOPHAGEAL ECHOCARDIOGRAM (TEE);  Surgeon: [name], MD;  Location: [facility] ENDOSCOPY;  Service: Cardiovascular;  Laterality: N/A;   TEE WITHOUT CARDIOVERSION N/A 12/23/2019   Procedure: TRANSESOPHAGEAL ECHOCARDIOGRAM (TEE);  Surgeon: [name], MD;  Location: [place] [facility];  Service: Cardiovascular;  Laterality: N/A;   TEE WITHOUT CARDIOVERSION  02/01/2020   Procedure: TRANSESOPHAGEAL ECHOCARDIOGRAM (TEE);  Surgeon: [name], MD;  Location: [place] [facility];  Service: Cardiovascular;;   TOTAL HIP ARTHROPLASTY  1994, 1995   Patient Active Problem List   Diagnosis Date Noted   Lipoma of right upper extremity 03/02/2021   Trigger little finger of left hand 09/29/2020   Secondary hypercoagulable state ([place]) 02/29/2020   Depression, major, single episode, moderate (HCC) 02/28/2020   Nonrheumatic tricuspid valve regurgitation    S/P mitral valve clip implantation 12/23/2019   Mitral valve disease 12/23/2019   Nonrheumatic mitral valve regurgitation    Persistent atrial  fibrillation (HCC)    Chronic systolic CHF (congestive heart failure) (HCC)    Acute idiopathic gout involving toe of left foot    Thrombocytopenia ([place]) 09/08/2019   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 ([place])    Diabetes mellitus type 2 in obese ([place]) 06/22/2019   Major depressive disorder    Obstructive sleep apnea on CPAP    Hypothyroidism 10/15/2017   Subclavian artery  stenosis, right ([place]) 06/15/2017   Type 2 diabetes mellitus without complication, without long-term current use of insulin ([place]) 02/26/2017   Severe obesity (BMI 35.0-39.9) with comorbidity ([place]) 02/26/2017   Essential hypertension 02/26/2017   Memory change 01/10/2017   Urge incontinence of urine 01/10/2017   Lightheaded 06/25/2016   AK (actinic keratosis) 01/03/2016   H/O syncope 03/06/2014   Occlusion and stenosis of left carotid artery 01/11/2014   Routine history and physical examination of adult 11/08/2013   History of repair of hip joint 04/20/2013   Hyperlipidemia 04/20/2013   Presence of artificial hip joint 04/20/2013   Vitamin D deficiency 06/18/2010   Concussion 01/24/2010   Elevated prostate specific antigen (PSA) 01/01/2007   Benign prostatic hyperplasia without urinary obstruction 04/22/2006   Osteoarthrosis 04/22/2006   Dyslipidemia 04/02/2006    PCP: Riki Sheer  REFERRING PROVIDER: Riki Sheer  REFERRING DIAG: M53.3- Sacroiliac dysfunction  THERAPY DIAG:  Difficulty in walking, not elsewhere classified  Repeated falls  Muscle weakness (generalized)  Other abnormalities of gait and mobility  Other lack of coordination  History of falling  Rationale for Evaluation and Treatment: Rehabilitation  ONSET DATE: 12/26/21  SUBJECTIVE:   SUBJECTIVE STATEMENT: Patient reports no new issues, no falls. He is walking without the walker around the house.   PERTINENT HISTORY: Past medical history significant for coronary artery disease, type 2 diabetes mellitus, previous MI, chronic systolic CHF, persistent A-fib  Had a fall on 02/01/22- tripped in a grocery store  PAIN:  Are you having pain? Yes: NPRS scale: 0/10 Pain location: head, shoulder, hip Pain description: sharp if I press on it other than that it is just dull Aggravating factors: nothing specifically Relieving factors: medicine  PRECAUTIONS: None  WEIGHT BEARING RESTRICTIONS:  No  FALLS:  Has patient fallen in last 6 months? Yes. Number of falls 2  LIVING ENVIRONMENT: Lives with: lives with their spouse Lives in: House/apartment Stairs: No Has following equipment at home: Environmental consultant - 2 wheeled  OCCUPATION: Retired  PLOF: Independent  PATIENT GOALS: I want to make sure I can become ambulatory again and get around without the walker  NEXT MD VISIT:   OBJECTIVE:   DIAGNOSTIC FINDINGS:  CT cervical spine:   No evidence of acute fracture or traumatic malalignment.  CT head:   1. No evidence of acute intracranial abnormality. 2. Stable chronic hypodense bilateral subdural collections. 3. Right forehead contusion.  IMPRESSION: Negative.  COGNITION: Overall cognitive status: Within functional limits for tasks assessed, some delay in response and wife reports some memory deficits    SENSATION: WFL   MUSCLE LENGTH: Hamstrings:bilateral tightness, more in LLE  POSTURE: rounded shoulders, forward head, and flexed trunk   PALPATION: Tightness in bilateral UT, more in R side LOWER EXTREMITY ROM: grossly WFL some pain with R side hip flexion    LOWER EXTREMITY MMT: grossly 4/5    FUNCTIONAL TESTS:  5 times sit to stand: 21.31s with UE use Timed up and go (TUG): 22.27s  GAIT: Distance walked: in clinic distances Assistive device utilized:  Walker - 2 wheeled Level of assistance: Modified independence Comments: shuffling feet, decreased stance time bilaterally, decrease stride length, poor foot clearance   TODAY'S TREATMENT:                                                                                                                              DATE:  03/01/22 NuStep L5 x4mns Feet on pball trunk rotations, knees to chest x10 Bridges 2x10  STS on airex 2x10 Box taps 6" Side steps over obstacles Step ups on airex- minA   02/26/22 Bike L2 x 5 minutes Supine DKTC over physioball x 5, hold 5 sec Supine bridge over physioball, repeat  with B hip IR, 10 each Seated clamshells and active add for lateral hip strengthening and stretch x 10, G tband. Sit to stqnd on Air ex pad, holding 2# ball in BUE with OHP at top. Min TC and VC to lean forward sufficiently to balance. 10 reps. B side to side stepping against G Tband resistance, B HHA, 2 x 10 reps each direction. B side stepping over dowels on the floor. Heel raises on black bar, 20 reps, BUE support for balance. Ambulation, walking x approximately 200', including multiple turns, avoiding obstacles, backwards stepping. No AD, no unsteadiness noted.  02/14/22- EVAL    PATIENT EDUCATION:  Education details: POC and HEP Person educated: Patient and Spouse Education method: Explanation Education comprehension: verbalized understanding  HOME EXERCISE PROGRAM: Access Code: [email or web address] URL: [email or web address] Date: 02/14/2022 Prepared by: MAndris Baumann Exercises - Sit to Stand with Arms Crossed  - 1 x daily - 7 x weekly - 2 sets - 10 reps - Standing March with Counter Support  - 1 x daily - 7 x weekly - 2 sets - 10 reps - Standing Hip Abduction with Unilateral Counter Support  - 1 x daily - 7 x weekly - 2 sets - 10 reps  ASSESSMENT:  CLINICAL IMPRESSION: Patient reports no issues today. Progressed into functional strengthening and balance training with step overs and box taps. Does well with step over obstacles, some light LOB but able to regain with small stepping strategies. Min A needed with step ups on airex, difficulty clearing toes and keeps tripping over edge.   OBJECTIVE IMPAIRMENTS: Abnormal gait, decreased balance, decreased mobility, difficulty walking, decreased strength, and pain.   ACTIVITY LIMITATIONS: bending, stairs, transfers, bed mobility, and locomotion level  PARTICIPATION LIMITATIONS: driving, shopping, and yard work  [employer]OTENTIAL: Good  CLINICAL DECISION MAKING: Stable/uncomplicated  EVALUATION COMPLEXITY:  Low   GOALS: Goals reviewed with patient? Yes  SHORT TERM GOALS: Target date: 03/28/22  Patient will be independent with initial HEP. Goal status: INITIAL  2.  Patient will demonstrate decreased fall risk by scoring < 15 sec on TUG. Baseline: 22.27s Goal status: INITIAL  3.  Patient will be educated on strategies to decrease risk of falls.  Goal status: INITIAL   LONG TERM GOALS: Target date: 05/09/22  Patient  will be independent with advanced/ongoing HEP to improve outcomes and carryover.  Goal status: INITIAL  2.  Patient will be able to ambulate 600' with LRAD with good safety to access community.  Baseline: using RW after fall Goal status: INITIAL  3.  Patient will demonstrate improved functional LE strength as demonstrated by decreased 5xSTS to <14s. Baseline: 21.21s Goal status: INITIAL  4.  Patient will score 46 on Berg Balance test to demonstrate lower risk of falls. (MCID= 8 points) .  Baseline: 38 Goal status: INITIAL   PLAN:  PT FREQUENCY: 1-2x/week  PT DURATION: 12 weeks  PLANNED INTERVENTIONS: Therapeutic exercises, Therapeutic activity, Neuromuscular re-education, Balance training, Gait training, Patient/Family education, Self Care, Joint mobilization, Stair training, Dry Needling, Electrical stimulation, Cryotherapy, Moist heat, Vasopneumatic device, Ionotophoresis 15m/ml Dexamethasone, and Manual therapy  PLAN FOR NEXT SESSION: start gym activities, work on strength and balance   SMarcelina Morel DPT 03/01/2022, 11:00 AM

## 2022-03-01 ENCOUNTER — Ambulatory Visit: Payer: Medicare (Managed Care)

## 2022-03-01 DIAGNOSIS — R278 Other lack of coordination: Secondary | ICD-10-CM

## 2022-03-01 DIAGNOSIS — R262 Difficulty in walking, not elsewhere classified: Secondary | ICD-10-CM

## 2022-03-01 DIAGNOSIS — R2689 Other abnormalities of gait and mobility: Secondary | ICD-10-CM

## 2022-03-01 DIAGNOSIS — R296 Repeated falls: Secondary | ICD-10-CM

## 2022-03-01 DIAGNOSIS — Z9181 History of falling: Secondary | ICD-10-CM

## 2022-03-01 DIAGNOSIS — M6281 Muscle weakness (generalized): Secondary | ICD-10-CM

## 2022-03-04 ENCOUNTER — Ambulatory Visit: Payer: Medicare (Managed Care) | Admitting: Family Medicine

## 2022-03-05 ENCOUNTER — Other Ambulatory Visit: Payer: Self-pay | Admitting: Family Medicine

## 2022-03-05 ENCOUNTER — Encounter: Payer: Self-pay | Admitting: Physical Therapy

## 2022-03-05 ENCOUNTER — Ambulatory Visit: Payer: Medicare (Managed Care) | Admitting: Physical Therapy

## 2022-03-05 DIAGNOSIS — R278 Other lack of coordination: Secondary | ICD-10-CM

## 2022-03-05 DIAGNOSIS — R2689 Other abnormalities of gait and mobility: Secondary | ICD-10-CM | POA: Diagnosis not present

## 2022-03-05 DIAGNOSIS — R296 Repeated falls: Secondary | ICD-10-CM

## 2022-03-05 DIAGNOSIS — R262 Difficulty in walking, not elsewhere classified: Secondary | ICD-10-CM

## 2022-03-05 DIAGNOSIS — M6281 Muscle weakness (generalized): Secondary | ICD-10-CM

## 2022-03-05 DIAGNOSIS — S060X0A Concussion without loss of consciousness, initial encounter: Secondary | ICD-10-CM

## 2022-03-05 NOTE — Therapy (Signed)
OUTPATIENT PHYSICAL THERAPY LOWER EXTREMITY TREATMENT   Patient Name: Raymond Christensen MRN: [id number] DOB:Jan 29, 1940, 82 y.o., male Today's Date: 03/05/2022  END OF SESSION:  PT End of Session - 03/05/22 0934     Visit Number 4    Date for PT Re-Evaluation 05/09/22    Authorization Type Medicare    PT Start Time 0930    PT Stop Time [id number]    PT Time Calculation (min) 44 min    Activity Tolerance Patient tolerated treatment well    Behavior During Therapy [facility] for tasks assessed/performed              Past Medical History:  Diagnosis Date   Arthritis    CAD (coronary artery disease)    Carotid artery disease ([place])    s/p left CEA 123456   Chronic systolic CHF (congestive heart failure) ([place])    Concussion Summer 2012   Essential hypertension 02/26/2017   Heart disease    Hyperlipidemia    Hypothyroidism 10/15/2017   Major depressive disorder    Mitral valve regurgitation    Myocardial infarction ([place]) 1996, 2021   Obstructive sleep apnea on CPAP    Persistent atrial fibrillation (HCC)    Poor flexibility of tendon 12/01/2018   S/P mitral valve clip implantation 12/23/2019   s/p TEER with MitraClip with one XTW by Dr. Burt Knack   Subungual hematoma of digit of hand 12/01/2018   Subungual hematoma of right foot 12/01/2018   Thyroid disease    Type 2 diabetes mellitus without complication, without long-term current use of insulin ([place]) 02/26/2017   Past Surgical History:  Procedure Laterality Date   ATRIAL FIBRILLATION ABLATION N/A 02/01/2020   Procedure: ATRIAL FIBRILLATION ABLATION;  Surgeon: [name], MD;  Location: [place] [facility];  Service: Cardiovascular;  Laterality: N/A;   BYPASS GRAFT  2001   CARDIAC CATHETERIZATION     CARDIOVERSION N/A 11/08/2019   Procedure: CARDIOVERSION;  Surgeon: [name], MD;  Location: [place];  Service: Cardiovascular;  Laterality: N/A;   CAROTID ENDARTERECTOMY Left 01/17/2014   CORONARY ANGIOPLASTY      CORONARY ARTERY BYPASS GRAFT     MITRAL VALVE REPAIR N/A 12/23/2019   Procedure: MITRAL VALVE REPAIR;  Surgeon: [name], MD;  Location: [place] [facility];  Service: Cardiovascular;  Laterality: N/A;   RIGHT/LEFT HEART CATH AND CORONARY/GRAFT ANGIOGRAPHY N/A 09/09/2019   Procedure: RIGHT/LEFT HEART CATH AND CORONARY/GRAFT ANGIOGRAPHY;  Surgeon: [name], MD;  Location: [place] [facility];  Service: Cardiovascular;  Laterality: N/A;   TEE WITHOUT CARDIOVERSION N/A 09/13/2019   Procedure: TRANSESOPHAGEAL ECHOCARDIOGRAM (TEE);  Surgeon: [name], MD;  Location: [facility] ENDOSCOPY;  Service: Cardiovascular;  Laterality: N/A;   TEE WITHOUT CARDIOVERSION N/A 12/23/2019   Procedure: TRANSESOPHAGEAL ECHOCARDIOGRAM (TEE);  Surgeon: [name], MD;  Location: [place] [facility];  Service: Cardiovascular;  Laterality: N/A;   TEE WITHOUT CARDIOVERSION  02/01/2020   Procedure: TRANSESOPHAGEAL ECHOCARDIOGRAM (TEE);  Surgeon: [name], MD;  Location: [place] [facility];  Service: Cardiovascular;;   TOTAL HIP ARTHROPLASTY  1994, 1995   Patient Active Problem List   Diagnosis Date Noted   Lipoma of right upper extremity 03/02/2021   Trigger little finger of left hand 09/29/2020   Secondary hypercoagulable state ([place]) 02/29/2020   Depression, major, single episode, moderate (HCC) 02/28/2020   Nonrheumatic tricuspid valve regurgitation    S/P mitral valve clip implantation 12/23/2019   Mitral valve disease 12/23/2019   Nonrheumatic mitral valve  regurgitation    Persistent atrial fibrillation (HCC)    Chronic systolic CHF (congestive heart failure) (HCC)    Acute idiopathic gout involving toe of left foot    Thrombocytopenia ([place]) 09/08/2019   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 ([place])    Diabetes mellitus type 2 in obese ([place]) 06/22/2019   Major depressive disorder    Obstructive sleep apnea on CPAP    Hypothyroidism  10/15/2017   Subclavian artery stenosis, right ([place]) 06/15/2017   Type 2 diabetes mellitus without complication, without long-term current use of insulin ([place]) 02/26/2017   Severe obesity (BMI 35.0-39.9) with comorbidity ([place]) 02/26/2017   Essential hypertension 02/26/2017   Memory change 01/10/2017   Urge incontinence of urine 01/10/2017   Lightheaded 06/25/2016   AK (actinic keratosis) 01/03/2016   H/O syncope 03/06/2014   Occlusion and stenosis of left carotid artery 01/11/2014   Routine history and physical examination of adult 11/08/2013   History of repair of hip joint 04/20/2013   Hyperlipidemia 04/20/2013   Presence of artificial hip joint 04/20/2013   Vitamin D deficiency 06/18/2010   Concussion 01/24/2010   Elevated prostate specific antigen (PSA) 01/01/2007   Benign prostatic hyperplasia without urinary obstruction 04/22/2006   Osteoarthrosis 04/22/2006   Dyslipidemia 04/02/2006    PCP: Riki Sheer  REFERRING PROVIDER: Riki Sheer  REFERRING DIAG: M53.3- Sacroiliac dysfunction  THERAPY DIAG:  Difficulty in walking, not elsewhere classified  Repeated falls  Muscle weakness (generalized)  Other lack of coordination  Rationale for Evaluation and Treatment: Rehabilitation  ONSET DATE: 12/26/21  SUBJECTIVE:   SUBJECTIVE STATEMENT: Patient comes in with questions about his neck stiffness, OT referral and decreasing falls.  I sent a note to his PCP about OT referral as he is having difficulty with dressing  PERTINENT HISTORY: Past medical history significant for coronary artery disease, type 2 diabetes mellitus, previous MI, chronic systolic CHF, persistent A-fib  Had a fall on 02/01/22- tripped in a grocery store  PAIN:  Are you having pain? Yes: NPRS scale: 0/10 Pain location: head, shoulder, hip Pain description: sharp if I press on it other than that it is just dull Aggravating factors: nothing specifically Relieving factors:  medicine  PRECAUTIONS: None  WEIGHT BEARING RESTRICTIONS: No  FALLS:  Has patient fallen in last 6 months? Yes. Number of falls 2  LIVING ENVIRONMENT: Lives with: lives with their spouse Lives in: House/apartment Stairs: No Has following equipment at home: Environmental consultant - 2 wheeled  OCCUPATION: Retired  PLOF: Independent  PATIENT GOALS: I want to make sure I can become ambulatory again and get around without the walker  NEXT MD VISIT:   OBJECTIVE:   DIAGNOSTIC FINDINGS:  CT cervical spine:   No evidence of acute fracture or traumatic malalignment.  CT head:   1. No evidence of acute intracranial abnormality. 2. Stable chronic hypodense bilateral subdural collections. 3. Right forehead contusion.  IMPRESSION: Negative.  COGNITION: Overall cognitive status: Within functional limits for tasks assessed, some delay in response and wife reports some memory deficits    SENSATION: WFL   MUSCLE LENGTH: Hamstrings:bilateral tightness, more in LLE  POSTURE: rounded shoulders, forward head, and flexed trunk   PALPATION: Tightness in bilateral UT, more in R side LOWER EXTREMITY ROM: grossly WFL some pain with R side hip flexion    LOWER EXTREMITY MMT: grossly 4/5    FUNCTIONAL TESTS:  5 times sit to stand: 21.31s with UE use Timed up and go (TUG): 22.27s  GAIT: Distance walked: in clinic distances Assistive device utilized: Environmental consultant - 2 wheeled Level of assistance: Modified independence Comments: shuffling feet, decreased stance time bilaterally, decrease stride length, poor foot clearance   TODAY'S TREATMENT:                                                                                                                              DATE:  03/05/22 Spoke with him and answered their questions STS with weighted yellow ball 2x5 Cone toe touches with light HHA Nustep Level 5 x 6 minutes Seated pball roll outs for back stretches Side step over objects, side step on  and off airex In pbars, marching, airex balance beam side stepping and tandem walking Fast gait 250 feet Ball toss, volleyball Direction changes   03/01/22 NuStep L5 x23mns Feet on pball trunk rotations, knees to chest x10 Bridges 2x10  STS on airex 2x10 Box taps 6" Side steps over obstacles Step ups on airex- minA   02/26/22 Bike L2 x 5 minutes Supine DKTC over physioball x 5, hold 5 sec Supine bridge over physioball, repeat with B hip IR, 10 each Seated clamshells and active add for lateral hip strengthening and stretch x 10, G tband. Sit to stqnd on Air ex pad, holding 2# ball in BUE with OHP at top. Min TC and VC to lean forward sufficiently to balance. 10 reps. B side to side stepping against G Tband resistance, B HHA, 2 x 10 reps each direction. B side stepping over dowels on the floor. Heel raises on black bar, 20 reps, BUE support for balance. Ambulation, walking x approximately 200', including multiple turns, avoiding obstacles, backwards stepping. No AD, no unsteadiness noted.  02/14/22- EVAL    PATIENT EDUCATION:  Education details: POC and HEP Person educated: Patient and Spouse Education method: Explanation Education comprehension: verbalized understanding  HOME EXERCISE PROGRAM: Access Code: [email or web address] URL: [email or web address] Date: 03/05/2022 Prepared by: MLum Babe Exercises - Standing with Head Rotation  - 1 x daily - 7 x weekly - 2 sets - 5 reps - 20 hold - Romberg Stance with Head Nods  - 1 x daily - 7 x weekly - 2 sets - 5 reps - 20 hold   Access Code: [email or web address] URL: [email or web address] Date: 02/14/2022 Prepared by: MAndris Baumann Exercises - Sit to Stand with Arms Crossed  - 1 x daily - 7 x weekly - 2 sets - 10 reps - Standing March with Counter Support  - 1 x daily - 7 x weekly - 2 sets - 10 reps - Standing Hip Abduction with Unilateral Counter Support  - 1 x daily - 7 x weekly - 2 sets - 10  reps  ASSESSMENT:  CLINICAL IMPRESSION: Patient and wife with some questions from their daughter about HEP and OT referral, I put in a note to the PCP about the OT referral, we added to the HEP and did a little more balance also did  not use the walker or a cane during todays treatment  OBJECTIVE IMPAIRMENTS: Abnormal gait, decreased balance, decreased mobility, difficulty walking, decreased strength, and pain.   ACTIVITY LIMITATIONS: bending, stairs, transfers, bed mobility, and locomotion level  PARTICIPATION LIMITATIONS: driving, shopping, and yard work  [employer] POTENTIAL: Good  CLINICAL DECISION MAKING: Stable/uncomplicated  EVALUATION COMPLEXITY: Low   GOALS: Goals reviewed with patient? Yes  SHORT TERM GOALS: Target date: 03/28/22  Patient will be independent with initial HEP. Goal status: met  2.  Patient will demonstrate decreased fall risk by scoring < 15 sec on TUG. Baseline: 22.27s Goal status: ongoing  3.  Patient will be educated on strategies to decrease risk of falls.  Goal status: INITIAL   LONG TERM GOALS: Target date: 05/09/22  Patient will be independent with advanced/ongoing HEP to improve outcomes and carryover.  Goal status: INITIAL  2.  Patient will be able to ambulate 600' with LRAD with good safety to access community.  Baseline: using RW after fall Goal status: INITIAL  3.  Patient will demonstrate improved functional LE strength as demonstrated by decreased 5xSTS to <14s. Baseline: 21.21s Goal status: INITIAL  4.  Patient will score 46 on Berg Balance test to demonstrate lower risk of falls. (MCID= 8 points) .  Baseline: 38 Goal status: INITIAL   PLAN:  PT FREQUENCY: 1-2x/week  PT DURATION: 12 weeks  PLANNED INTERVENTIONS: Therapeutic exercises, Therapeutic activity, Neuromuscular re-education, Balance training, Gait training, Patient/Family education, Self Care, Joint mobilization, Stair training, Dry Needling, Electrical stimulation,  Cryotherapy, Moist heat, Vasopneumatic device, Ionotophoresis 64m/ml Dexamethasone, and Manual therapy  PLAN FOR NEXT SESSION: start gym activities, work on strength and balance   MLum Babe PT 03/05/2022, 9:37 AM

## 2022-03-06 NOTE — Therapy (Signed)
OUTPATIENT PHYSICAL THERAPY LOWER EXTREMITY TREATMENT   Patient Name: Raymond Christensen MRN: [id number] DOB:[birth year], 82 y.o., male Today's Date: 03/07/2022  END OF SESSION:  PT End of Session - 03/07/22 1103     Visit Number 5    Date for PT Re-Evaluation 05/09/22    Authorization Type Medicare    PT Start Time 1100    PT Stop Time 1145    PT Time Calculation (min) 45 min    Activity Tolerance Patient tolerated treatment well    Behavior During Therapy [facility] for tasks assessed/performed              Past Medical History:  Diagnosis Date   Arthritis    CAD (coronary artery disease)    Carotid artery disease ([place])    s/p left CEA 123456   Chronic systolic CHF (congestive heart failure) ([place])    Concussion Summer 2012   Essential hypertension 02/26/2017   Heart disease    Hyperlipidemia    Hypothyroidism 10/15/2017   Major depressive disorder    Mitral valve regurgitation    Myocardial infarction ([place]) 1996, 2021   Obstructive sleep apnea on CPAP    Persistent atrial fibrillation (HCC)    Poor flexibility of tendon 12/01/2018   S/P mitral valve clip implantation 12/23/2019   s/p TEER with MitraClip with one XTW by Dr. Burt Knack   Subungual hematoma of digit of hand 12/01/2018   Subungual hematoma of right foot 12/01/2018   Thyroid disease    Type 2 diabetes mellitus without complication, without long-term current use of insulin ([place]) 02/26/2017   Past Surgical History:  Procedure Laterality Date   ATRIAL FIBRILLATION ABLATION N/A 02/01/2020   Procedure: ATRIAL FIBRILLATION ABLATION;  Surgeon: [name], MD;  Location: [place] [facility];  Service: Cardiovascular;  Laterality: N/A;   BYPASS GRAFT  2001   CARDIAC CATHETERIZATION     CARDIOVERSION N/A 11/08/2019   Procedure: CARDIOVERSION;  Surgeon: [name], MD;  Location: [place];  Service: Cardiovascular;  Laterality: N/A;   CAROTID ENDARTERECTOMY Left 01/17/2014   CORONARY ANGIOPLASTY      CORONARY ARTERY BYPASS GRAFT     MITRAL VALVE REPAIR N/A 12/23/2019   Procedure: MITRAL VALVE REPAIR;  Surgeon: [name], MD;  Location: [place] [facility];  Service: Cardiovascular;  Laterality: N/A;   RIGHT/LEFT HEART CATH AND CORONARY/GRAFT ANGIOGRAPHY N/A 09/09/2019   Procedure: RIGHT/LEFT HEART CATH AND CORONARY/GRAFT ANGIOGRAPHY;  Surgeon: [name], MD;  Location: [place] [facility];  Service: Cardiovascular;  Laterality: N/A;   TEE WITHOUT CARDIOVERSION N/A 09/13/2019   Procedure: TRANSESOPHAGEAL ECHOCARDIOGRAM (TEE);  Surgeon: [name], MD;  Location: [facility];  Service: Cardiovascular;  Laterality: N/A;   TEE WITHOUT CARDIOVERSION N/A 12/23/2019   Procedure: TRANSESOPHAGEAL ECHOCARDIOGRAM (TEE);  Surgeon: [name], MD;  Location: [place] [facility];  Service: Cardiovascular;  Laterality: N/A;   TEE WITHOUT CARDIOVERSION  02/01/2020   Procedure: TRANSESOPHAGEAL ECHOCARDIOGRAM (TEE);  Surgeon: [name], MD;  Location: [place] [facility];  Service: Cardiovascular;;   TOTAL HIP ARTHROPLASTY  1994, 1995   Patient Active Problem List   Diagnosis Date Noted   Lipoma of right upper extremity 03/02/2021   Trigger little finger of left hand 09/29/2020   Secondary hypercoagulable state ([place]) 02/29/2020   Depression, major, single episode, moderate (HCC) 02/28/2020   Nonrheumatic tricuspid valve regurgitation    S/P mitral valve clip implantation 12/23/2019   Mitral valve disease 12/23/2019   Nonrheumatic mitral valve  regurgitation    Persistent atrial fibrillation (HCC)    Chronic systolic CHF (congestive heart failure) (HCC)    Acute idiopathic gout involving toe of left foot    Thrombocytopenia ([place]) 09/08/2019   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 ([place])    Diabetes mellitus type 2 in obese ([place]) 06/22/2019   Major depressive disorder    Obstructive sleep apnea on CPAP    Hypothyroidism  10/15/2017   Subclavian artery stenosis, right ([place]) 06/15/2017   Type 2 diabetes mellitus without complication, without long-term current use of insulin ([place]) 02/26/2017   Severe obesity (BMI 35.0-39.9) with comorbidity ([place]) 02/26/2017   Essential hypertension 02/26/2017   Memory change 01/10/2017   Urge incontinence of urine 01/10/2017   Lightheaded 06/25/2016   AK (actinic keratosis) 01/03/2016   H/O syncope 03/06/2014   Occlusion and stenosis of left carotid artery 01/11/2014   Routine history and physical examination of adult 11/08/2013   History of repair of hip joint 04/20/2013   Hyperlipidemia 04/20/2013   Presence of artificial hip joint 04/20/2013   Vitamin D deficiency 06/18/2010   Concussion 01/24/2010   Elevated prostate specific antigen (PSA) 01/01/2007   Benign prostatic hyperplasia without urinary obstruction 04/22/2006   Osteoarthrosis 04/22/2006   Dyslipidemia 04/02/2006    PCP: Riki Sheer  REFERRING PROVIDER: Riki Sheer  REFERRING DIAG: M53.3- Sacroiliac dysfunction  THERAPY DIAG:  Other abnormalities of gait and mobility  Difficulty in walking, not elsewhere classified  Muscle weakness (generalized)  Repeated falls  Rationale for Evaluation and Treatment: Rehabilitation  ONSET DATE: 12/26/21  SUBJECTIVE:   SUBJECTIVE STATEMENT: I haven't not been using the walker since the weekend, no falls.   PERTINENT HISTORY: Past medical history significant for coronary artery disease, type 2 diabetes mellitus, previous MI, chronic systolic CHF, persistent A-fib  Had a fall on 02/01/22- tripped in a grocery store  PAIN:  Are you having pain? Yes: NPRS scale: 0/10 Pain location: head, shoulder, hip Pain description: sharp if I press on it other than that it is just dull Aggravating factors: nothing specifically Relieving factors: medicine  PRECAUTIONS: None  WEIGHT BEARING RESTRICTIONS: No  FALLS:  Has patient fallen in last 6 months?  Yes. Number of falls 2  LIVING ENVIRONMENT: Lives with: lives with their spouse Lives in: House/apartment Stairs: No Has following equipment at home: Environmental consultant - 2 wheeled  OCCUPATION: Retired  PLOF: Independent  PATIENT GOALS: I want to make sure I can become ambulatory again and get around without the walker  NEXT MD VISIT:   OBJECTIVE:   DIAGNOSTIC FINDINGS:  CT cervical spine:   No evidence of acute fracture or traumatic malalignment.  CT head:   1. No evidence of acute intracranial abnormality. 2. Stable chronic hypodense bilateral subdural collections. 3. Right forehead contusion.  IMPRESSION: Negative.  COGNITION: Overall cognitive status: Within functional limits for tasks assessed, some delay in response and wife reports some memory deficits    SENSATION: WFL   MUSCLE LENGTH: Hamstrings:bilateral tightness, more in LLE  POSTURE: rounded shoulders, forward head, and flexed trunk   PALPATION: Tightness in bilateral UT, more in R side LOWER EXTREMITY ROM: grossly WFL some pain with R side hip flexion    LOWER EXTREMITY MMT: grossly 4/5    FUNCTIONAL TESTS:  5 times sit to stand: 21.31s with UE use Timed up and go (TUG): 22.27s  GAIT: Distance walked: in clinic distances Assistive device utilized: Walker - 2 wheeled Level of assistance:  Modified independence Comments: shuffling feet, decreased stance time bilaterally, decrease stride length, poor foot clearance   TODAY'S TREATMENT:                                                                                                                              DATE:  03/07/22 Bike L3 x35mns  2 way hip 3# 2x10 Marching on airex in // bars  Side steps on beam in // bars Narrow walking on beam in // bars Shoulder ext 10# 2x10 STS with OHP yellow 2x10 Leg press 40# 2x10 Calf raises 2x10   03/05/22 Spoke with him and answered their questions STS with weighted yellow ball 2x5 Cone toe touches with  light HHA Nustep Level 5 x 6 minutes Seated pball roll outs for back stretches Side step over objects, side step on and off airex In pbars, marching, airex balance beam side stepping and tandem walking Fast gait 250 feet Ball toss, volleyball Direction changes   03/01/22 NuStep L5 x649ms Feet on pball trunk rotations, knees to chest x10 Bridges 2x10  STS on airex 2x10 Box taps 6" Side steps over obstacles Step ups on airex- minA   02/26/22 Bike L2 x 5 minutes Supine DKTC over physioball x 5, hold 5 sec Supine bridge over physioball, repeat with B hip IR, 10 each Seated clamshells and active add for lateral hip strengthening and stretch x 10, G tband. Sit to stqnd on Air ex pad, holding 2# ball in BUE with OHP at top. Min TC and VC to lean forward sufficiently to balance. 10 reps. B side to side stepping against G Tband resistance, B HHA, 2 x 10 reps each direction. B side stepping over dowels on the floor. Heel raises on black bar, 20 reps, BUE support for balance. Ambulation, walking x approximately 200', including multiple turns, avoiding obstacles, backwards stepping. No AD, no unsteadiness noted.  02/14/22- EVAL    PATIENT EDUCATION:  Education details: POC and HEP Person educated: Patient and Spouse Education method: Explanation Education comprehension: verbalized understanding  HOME EXERCISE PROGRAM: Access Code: [email or web address] URL: [email or web address] Date: 03/05/2022 Prepared by: MiLum BabeExercises - Standing with Head Rotation  - 1 x daily - 7 x weekly - 2 sets - 5 reps - 20 hold - Romberg Stance with Head Nods  - 1 x daily - 7 x weekly - 2 sets - 5 reps - 20 hold   Access Code: [email or web address] URL: [email or web address] Date: 02/14/2022 Prepared by: MoAndris BaumannExercises - Sit to Stand with Arms Crossed  - 1 x daily - 7 x weekly - 2 sets - 10 reps - Standing March with Counter Support  - 1 x daily - 7 x weekly - 2 sets - 10  reps - Standing Hip Abduction with Unilateral Counter Support  - 1 x daily - 7 x weekly - 2 sets - 10 reps  ASSESSMENT:  CLINICAL IMPRESSION: Patient ambulating without device. Unsteady on  uneven surfaces, requires HHA assistance in parallel bars. Does well with strengthening exercises.   OBJECTIVE IMPAIRMENTS: Abnormal gait, decreased balance, decreased mobility, difficulty walking, decreased strength, and pain.   ACTIVITY LIMITATIONS: bending, stairs, transfers, bed mobility, and locomotion level  PARTICIPATION LIMITATIONS: driving, shopping, and yard work  [employer] POTENTIAL: Good  CLINICAL DECISION MAKING: Stable/uncomplicated  EVALUATION COMPLEXITY: Low   GOALS: Goals reviewed with patient? Yes  SHORT TERM GOALS: Target date: 03/28/22  Patient will be independent with initial HEP. Goal status: met  2.  Patient will demonstrate decreased fall risk by scoring < 15 sec on TUG. Baseline: 22.27s Goal status: ongoing  3.  Patient will be educated on strategies to decrease risk of falls.  Goal status: INITIAL   LONG TERM GOALS: Target date: 05/09/22  Patient will be independent with advanced/ongoing HEP to improve outcomes and carryover.  Goal status: INITIAL  2.  Patient will be able to ambulate 600' with LRAD with good safety to access community.  Baseline: using RW after fall Goal status: INITIAL  3.  Patient will demonstrate improved functional LE strength as demonstrated by decreased 5xSTS to <14s. Baseline: 21.21s Goal status: INITIAL  4.  Patient will score 46 on Berg Balance test to demonstrate lower risk of falls. (MCID= 8 points) .  Baseline: 38 Goal status: INITIAL   PLAN:  PT FREQUENCY: 1-2x/week  PT DURATION: 12 weeks  PLANNED INTERVENTIONS: Therapeutic exercises, Therapeutic activity, Neuromuscular re-education, Balance training, Gait training, Patient/Family education, Self Care, Joint mobilization, Stair training, Dry Needling, Electrical  stimulation, Cryotherapy, Moist heat, Vasopneumatic device, Ionotophoresis 55m/ml Dexamethasone, and Manual therapy  PLAN FOR NEXT SESSION: start gym activities, work on strength and balance   MLum Babe PT 03/07/2022, 11:43 AM

## 2022-03-07 ENCOUNTER — Ambulatory Visit: Payer: Medicare (Managed Care)

## 2022-03-07 DIAGNOSIS — R262 Difficulty in walking, not elsewhere classified: Secondary | ICD-10-CM

## 2022-03-07 DIAGNOSIS — M6281 Muscle weakness (generalized): Secondary | ICD-10-CM

## 2022-03-07 DIAGNOSIS — R296 Repeated falls: Secondary | ICD-10-CM

## 2022-03-07 DIAGNOSIS — R2689 Other abnormalities of gait and mobility: Secondary | ICD-10-CM | POA: Diagnosis not present

## 2022-03-11 ENCOUNTER — Encounter: Payer: Self-pay | Admitting: Occupational Therapy

## 2022-03-11 ENCOUNTER — Encounter: Payer: Self-pay | Admitting: Neurology

## 2022-03-11 ENCOUNTER — Ambulatory Visit: Payer: Medicare (Managed Care) | Admitting: Occupational Therapy

## 2022-03-11 ENCOUNTER — Ambulatory Visit: Payer: Medicare (Managed Care) | Admitting: Physical Therapy

## 2022-03-11 ENCOUNTER — Encounter: Payer: Self-pay | Admitting: Physical Therapy

## 2022-03-11 DIAGNOSIS — R262 Difficulty in walking, not elsewhere classified: Secondary | ICD-10-CM

## 2022-03-11 DIAGNOSIS — M6281 Muscle weakness (generalized): Secondary | ICD-10-CM

## 2022-03-11 DIAGNOSIS — R2689 Other abnormalities of gait and mobility: Secondary | ICD-10-CM

## 2022-03-11 DIAGNOSIS — R278 Other lack of coordination: Secondary | ICD-10-CM

## 2022-03-11 DIAGNOSIS — R41844 Frontal lobe and executive function deficit: Secondary | ICD-10-CM

## 2022-03-11 DIAGNOSIS — R296 Repeated falls: Secondary | ICD-10-CM

## 2022-03-11 DIAGNOSIS — R41840 Attention and concentration deficit: Secondary | ICD-10-CM

## 2022-03-11 DIAGNOSIS — Z9181 History of falling: Secondary | ICD-10-CM

## 2022-03-11 NOTE — Therapy (Signed)
OUTPATIENT PHYSICAL THERAPY LOWER EXTREMITY TREATMENT   Patient Name: Raymond Christensen MRN: [id number] DOB:02-Jan-1941, 82 y.o., male Today's Date: 03/11/2022  END OF SESSION:  PT End of Session - 03/11/22 0932     Visit Number 6    Date for PT Re-Evaluation 05/09/22    PT Start Time 0929    PT Stop Time 1010    PT Time Calculation (min) 41 min    Activity Tolerance Patient tolerated treatment well    Behavior During Therapy [facility] for tasks assessed/performed              Past Medical History:  Diagnosis Date   Arthritis    CAD (coronary artery disease)    Carotid artery disease ([place])    s/p left CEA 123456   Chronic systolic CHF (congestive heart failure) ([place])    Concussion Summer 2012   Essential hypertension 02/26/2017   Heart disease    Hyperlipidemia    Hypothyroidism 10/15/2017   Major depressive disorder    Mitral valve regurgitation    Myocardial infarction ([place]) 1996, 2021   Obstructive sleep apnea on CPAP    Persistent atrial fibrillation (HCC)    Poor flexibility of tendon 12/01/2018   S/P mitral valve clip implantation 12/23/2019   s/p TEER with MitraClip with one XTW by Dr. Burt Knack   Subungual hematoma of digit of hand 12/01/2018   Subungual hematoma of right foot 12/01/2018   Thyroid disease    Type 2 diabetes mellitus without complication, without long-term current use of insulin ([place]) 02/26/2017   Past Surgical History:  Procedure Laterality Date   ATRIAL FIBRILLATION ABLATION N/A 02/01/2020   Procedure: ATRIAL FIBRILLATION ABLATION;  Surgeon: [name], MD;  Location: [place] [facility];  Service: Cardiovascular;  Laterality: N/A;   BYPASS GRAFT  2001   CARDIAC CATHETERIZATION     CARDIOVERSION N/A 11/08/2019   Procedure: CARDIOVERSION;  Surgeon: [name], MD;  Location: [place];  Service: Cardiovascular;  Laterality: N/A;   CAROTID ENDARTERECTOMY Left 01/17/2014   CORONARY ANGIOPLASTY     CORONARY ARTERY BYPASS GRAFT      MITRAL VALVE REPAIR N/A 12/23/2019   Procedure: MITRAL VALVE REPAIR;  Surgeon: [name], MD;  Location: [place] [facility];  Service: Cardiovascular;  Laterality: N/A;   RIGHT/LEFT HEART CATH AND CORONARY/GRAFT ANGIOGRAPHY N/A 09/09/2019   Procedure: RIGHT/LEFT HEART CATH AND CORONARY/GRAFT ANGIOGRAPHY;  Surgeon: [name], MD;  Location: [place] [facility];  Service: Cardiovascular;  Laterality: N/A;   TEE WITHOUT CARDIOVERSION N/A 09/13/2019   Procedure: TRANSESOPHAGEAL ECHOCARDIOGRAM (TEE);  Surgeon: [name], MD;  Location: [facility]OSCOPY;  Service: Cardiovascular;  Laterality: N/A;   TEE WITHOUT CARDIOVERSION N/A 12/23/2019   Procedure: TRANSESOPHAGEAL ECHOCARDIOGRAM (TEE);  Surgeon: [name], MD;  Location: [place] [facility];  Service: Cardiovascular;  Laterality: N/A;   TEE WITHOUT CARDIOVERSION  02/01/2020   Procedure: TRANSESOPHAGEAL ECHOCARDIOGRAM (TEE);  Surgeon: [name], MD;  Location: [place] [facility];  Service: Cardiovascular;;   TOTAL HIP ARTHROPLASTY  1994, 1995   Patient Active Problem List   Diagnosis Date Noted   Lipoma of right upper extremity 03/02/2021   Trigger little finger of left hand 09/29/2020   Secondary hypercoagulable state ([place]) 02/29/2020   Depression, major, single episode, moderate (HCC) 02/28/2020   Nonrheumatic tricuspid valve regurgitation    S/P mitral valve clip implantation 12/23/2019   Mitral valve disease 12/23/2019   Nonrheumatic mitral valve regurgitation    Persistent atrial  fibrillation (HCC)    Chronic systolic CHF (congestive heart failure) (HCC)    Acute idiopathic gout involving toe of left foot    Thrombocytopenia ([place]) 09/08/2019   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 ([place])    Diabetes mellitus type 2 in obese ([place]) 06/22/2019   Major depressive disorder    Obstructive sleep apnea on CPAP    Hypothyroidism 10/15/2017   Subclavian artery  stenosis, right ([place]) 06/15/2017   Type 2 diabetes mellitus without complication, without long-term current use of insulin ([place]) 02/26/2017   Severe obesity (BMI 35.0-39.9) with comorbidity ([place]) 02/26/2017   Essential hypertension 02/26/2017   Memory change 01/10/2017   Urge incontinence of urine 01/10/2017   Lightheaded 06/25/2016   AK (actinic keratosis) 01/03/2016   H/O syncope 03/06/2014   Occlusion and stenosis of left carotid artery 01/11/2014   Routine history and physical examination of adult 11/08/2013   History of repair of hip joint 04/20/2013   Hyperlipidemia 04/20/2013   Presence of artificial hip joint 04/20/2013   Vitamin D deficiency 06/18/2010   Concussion 01/24/2010   Elevated prostate specific antigen (PSA) 01/01/2007   Benign prostatic hyperplasia without urinary obstruction 04/22/2006   Osteoarthrosis 04/22/2006   Dyslipidemia 04/02/2006    PCP: Riki Sheer  REFERRING PROVIDER: Riki Sheer  REFERRING DIAG: M53.3- Sacroiliac dysfunction  THERAPY DIAG:  Other abnormalities of gait and mobility  Difficulty in walking, not elsewhere classified  Muscle weakness (generalized)  Repeated falls  History of falling  Other lack of coordination  Rationale for Evaluation and Treatment: Rehabilitation  ONSET DATE: 12/26/21  SUBJECTIVE:   SUBJECTIVE STATEMENT: Patient reports that he can stand up better. His flexibility and balance still need work.   PERTINENT HISTORY: Past medical history significant for coronary artery disease, type 2 diabetes mellitus, previous MI, chronic systolic CHF, persistent A-fib  Had a fall on 02/01/22- tripped in a grocery store  PAIN:  Are you having pain? Yes: NPRS scale: 0/10 Pain location: head, shoulder, hip Pain description: sharp if I press on it other than that it is just dull Aggravating factors: nothing specifically Relieving factors: medicine  PRECAUTIONS: None  WEIGHT BEARING RESTRICTIONS:  No  FALLS:  Has patient fallen in last 6 months? Yes. Number of falls 2  LIVING ENVIRONMENT: Lives with: lives with their spouse Lives in: House/apartment Stairs: No Has following equipment at home: Environmental consultant - 2 wheeled  OCCUPATION: Retired  PLOF: Independent  PATIENT GOALS: I want to make sure I can become ambulatory again and get around without the walker  NEXT MD VISIT:   OBJECTIVE:   DIAGNOSTIC FINDINGS:  CT cervical spine:   No evidence of acute fracture or traumatic malalignment.  CT head:   1. No evidence of acute intracranial abnormality. 2. Stable chronic hypodense bilateral subdural collections. 3. Right forehead contusion.  IMPRESSION: Negative.  COGNITION: Overall cognitive status: Within functional limits for tasks assessed, some delay in response and wife reports some memory deficits    SENSATION: WFL   MUSCLE LENGTH: Hamstrings:bilateral tightness, more in LLE  POSTURE: rounded shoulders, forward head, and flexed trunk   PALPATION: Tightness in bilateral UT, more in R side LOWER EXTREMITY ROM: grossly WFL some pain with R side hip flexion    LOWER EXTREMITY MMT: grossly 4/5    FUNCTIONAL TESTS:  5 times sit to stand: 21.31s with UE use Timed up and go (TUG): 22.27s  GAIT: Distance walked: in clinic distances Assistive device utilized: Environmental consultant -  2 wheeled Level of assistance: Modified independence Comments: shuffling feet, decreased stance time bilaterally, decrease stride length, poor foot clearance   TODAY'S TREATMENT:                                                                                                                              DATE:  03/11/22 NuStep L5 x 6 minutes. Trunk mobilization-seated, rotate to L, walk hands out and back x 4, repeat to R Seated HS stretch, 3 x 15 sec each leg Supine stretch with strap, Abd, Add, figure 4, ITB, LTR, 3 x 15 sec each. Balance/mobility training in the parallel bars-crossover and  back with trunk rotation, minimize UE support, but required at least UUE. Quick steps side to side x 30 seconds Ambulation with quick changes of direction, forward, back, side to side, increased speed, approximately 200'   03/07/22 Bike L3 x35mns  2 way hip 3# 2x10 Marching on airex in // bars  Side steps on beam in // bars Narrow walking on beam in // bars Shoulder ext 10# 2x10 STS with OHP yellow 2x10 Leg press 40# 2x10 Calf raises 2x10   03/05/22 Spoke with him and answered their questions STS with weighted yellow ball 2x5 Cone toe touches with light HHA Nustep Level 5 x 6 minutes Seated pball roll outs for back stretches Side step over objects, side step on and off airex In pbars, marching, airex balance beam side stepping and tandem walking Fast gait 250 feet Ball toss, volleyball Direction changes   03/01/22 NuStep L5 x663ms Feet on pball trunk rotations, knees to chest x10 Bridges 2x10  STS on airex 2x10 Box taps 6" Side steps over obstacles Step ups on airex- minA   02/26/22 Bike L2 x 5 minutes Supine DKTC over physioball x 5, hold 5 sec Supine bridge over physioball, repeat with B hip IR, 10 each Seated clamshells and active add for lateral hip strengthening and stretch x 10, G tband. Sit to stqnd on Air ex pad, holding 2# ball in BUE with OHP at top. Min TC and VC to lean forward sufficiently to balance. 10 reps. B side to side stepping against G Tband resistance, B HHA, 2 x 10 reps each direction. B side stepping over dowels on the floor. Heel raises on black bar, 20 reps, BUE support for balance. Ambulation, walking x approximately 200', including multiple turns, avoiding obstacles, backwards stepping. No AD, no unsteadiness noted.  02/14/22- EVAL    PATIENT EDUCATION:  Education details: POC and HEP Person educated: Patient and Spouse Education method: Explanation Education comprehension: verbalized understanding  HOME EXERCISE PROGRAM:  Access  Code: [email or web address] URL: [email or web address] Date: 02/14/2022 Prepared by: MoAndris BaumannExercises - Sit to Stand with Arms Crossed  - 1 x daily - 7 x weekly - 2 sets - 10 reps - Standing March with Counter Support  - 1 x daily - 7 x weekly - 2 sets - 10 reps -  Standing Hip Abduction with Unilateral Counter Support  - 1 x daily - 7 x weekly - 2 sets - 10 reps - Standing with Head Rotation  - 1 x daily - 7 x weekly - 2 sets - 5 reps - 20 hold - Romberg Stance with Head Nods  - 1 x daily - 7 x weekly - 2 sets - 5 reps - 20 hold  ASSESSMENT:  CLINICAL IMPRESSION: Patient ambulating without device.  Reports no falls. Treatment progressed with stretching and balance training. He tolerated all with slightly improved speed of movement.  OBJECTIVE IMPAIRMENTS: Abnormal gait, decreased balance, decreased mobility, difficulty walking, decreased strength, and pain.   ACTIVITY LIMITATIONS: bending, stairs, transfers, bed mobility, and locomotion level  PARTICIPATION LIMITATIONS: driving, shopping, and yard work  [employer] POTENTIAL: Good  CLINICAL DECISION MAKING: Stable/uncomplicated  EVALUATION COMPLEXITY: Low   GOALS: Goals reviewed with patient? Yes  SHORT TERM GOALS: Target date: 03/28/22  Patient will be independent with initial HEP. Goal status: met  2.  Patient will demonstrate decreased fall risk by scoring < 15 sec on TUG. Baseline: 22.27s Goal status: ongoing  3.  Patient will be educated on strategies to decrease risk of falls.  Goal status: INITIAL   LONG TERM GOALS: Target date: 05/09/22  Patient will be independent with advanced/ongoing HEP to improve outcomes and carryover.  Goal status: INITIAL  2.  Patient will be able to ambulate 600' with LRAD with good safety to access community.  Baseline: using RW after fall Goal status: INITIAL  3.  Patient will demonstrate improved functional LE strength as demonstrated by decreased 5xSTS to <14s. Baseline:  21.21s Goal status: INITIAL  4.  Patient will score 46 on Berg Balance test to demonstrate lower risk of falls. (MCID= 8 points) .  Baseline: 38 Goal status: INITIAL   PLAN:  PT FREQUENCY: 1-2x/week  PT DURATION: 12 weeks  PLANNED INTERVENTIONS: Therapeutic exercises, Therapeutic activity, Neuromuscular re-education, Balance training, Gait training, Patient/Family education, Self Care, Joint mobilization, Stair training, Dry Needling, Electrical stimulation, Cryotherapy, Moist heat, Vasopneumatic device, Ionotophoresis '4mg'$ /ml Dexamethasone, and Manual therapy  PLAN FOR NEXT SESSION: start gym activities, work on strength and balance  Ethel Rana DPT 03/11/22 10:21 AM

## 2022-03-11 NOTE — Therapy (Signed)
OUTPATIENT OCCUPATIONAL THERAPY NEURO EVALUATION  Patient Name: Raymond Christensen MRN: [id number] DOB:12-06-1940, 82 y.o., male Today's Date: 03/11/2022  PCP:Dr. Nani Ravens REFERRING PROVIDER: Dr. Nani Ravens  END OF SESSION:  OT End of Session - 03/11/22 1029     Visit Number 1    Number of Visits 17    Date for OT Re-Evaluation 05/06/22    Authorization Type generic Medicare    Authorization - Visit Number 1    Progress Note Due on Visit 10    OT Start Time 1015    OT Stop Time [id number]    OT Time Calculation (min) 36 min    Behavior During Therapy [facility] for tasks assessed/performed             Past Medical History:  Diagnosis Date   Arthritis    CAD (coronary artery disease)    Carotid artery disease ([place])    s/p left CEA [id number]   Chronic systolic CHF (congestive heart failure) ([place])    Concussion Summer 2012   Essential hypertension 02/26/2017   Heart disease    Hyperlipidemia    Hypothyroidism 10/15/2017   Major depressive disorder    Mitral valve regurgitation    Myocardial infarction ([place]) 1996, 2021   Obstructive sleep apnea on CPAP    Persistent atrial fibrillation (HCC)    Poor flexibility of tendon 12/01/2018   S/P mitral valve clip implantation 12/23/2019   s/p TEER with MitraClip with one XTW by Dr. Burt Knack   Subungual hematoma of digit of hand 12/01/2018   Subungual hematoma of right foot 12/01/2018   Thyroid disease    Type 2 diabetes mellitus without complication, without long-term current use of insulin ([place]) 02/26/2017   Past Surgical History:  Procedure Laterality Date   ATRIAL FIBRILLATION ABLATION N/A 02/01/2020   Procedure: ATRIAL FIBRILLATION ABLATION;  Surgeon: [name], MD;  Location: [place] [facility];  Service: Cardiovascular;  Laterality: N/A;   BYPASS GRAFT  2001   CARDIAC CATHETERIZATION     CARDIOVERSION N/A 11/08/2019   Procedure: CARDIOVERSION;  Surgeon: [name], MD;  Location: [place];  Service: Cardiovascular;   Laterality: N/A;   CAROTID ENDARTERECTOMY Left 01/17/2014   CORONARY ANGIOPLASTY     CORONARY ARTERY BYPASS GRAFT     MITRAL VALVE REPAIR N/A 12/23/2019   Procedure: MITRAL VALVE REPAIR;  Surgeon: [name], MD;  Location: [place] [facility];  Service: Cardiovascular;  Laterality: N/A;   RIGHT/LEFT HEART CATH AND CORONARY/GRAFT ANGIOGRAPHY N/A 09/09/2019   Procedure: RIGHT/LEFT HEART CATH AND CORONARY/GRAFT ANGIOGRAPHY;  Surgeon: [name], MD;  Location: [place] [facility];  Service: Cardiovascular;  Laterality: N/A;   TEE WITHOUT CARDIOVERSION N/A 09/13/2019   Procedure: TRANSESOPHAGEAL ECHOCARDIOGRAM (TEE);  Surgeon: [name], MD;  Location: [facility];  Service: Cardiovascular;  Laterality: N/A;   TEE WITHOUT CARDIOVERSION N/A 12/23/2019   Procedure: TRANSESOPHAGEAL ECHOCARDIOGRAM (TEE);  Surgeon: [name], MD;  Location: [place] [facility];  Service: Cardiovascular;  Laterality: N/A;   TEE WITHOUT CARDIOVERSION  02/01/2020   Procedure: TRANSESOPHAGEAL ECHOCARDIOGRAM (TEE);  Surgeon: [name], MD;  Location: [place] [facility];  Service: Cardiovascular;;   TOTAL HIP ARTHROPLASTY  1994, 1995   Patient Active Problem List   Diagnosis Date Noted   Lipoma of right upper extremity 03/02/2021   Trigger little finger of left hand 09/29/2020   Secondary hypercoagulable state ([place]) 02/29/2020   Depression, major, single episode, moderate (HCC) 02/28/2020   Nonrheumatic tricuspid valve regurgitation  S/P mitral valve clip implantation 12/23/2019   Mitral valve disease 12/23/2019   Nonrheumatic mitral valve regurgitation    Persistent atrial fibrillation (HCC)    Chronic systolic CHF (congestive heart failure) (HCC)    Acute idiopathic gout involving toe of left foot    Thrombocytopenia ([place]) 09/08/2019   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)    Diabetes mellitus type 2 in obese ([place]) 06/22/2019    Major depressive disorder    Obstructive sleep apnea on CPAP    Hypothyroidism 10/15/2017   Subclavian artery stenosis, right ([place]) 06/15/2017   Type 2 diabetes mellitus without complication, without long-term current use of insulin ([place]) 02/26/2017   Severe obesity (BMI 35.0-39.9) with comorbidity ([place]) 02/26/2017   Essential hypertension 02/26/2017   Memory change 01/10/2017   Urge incontinence of urine 01/10/2017   Lightheaded 06/25/2016   AK (actinic keratosis) 01/03/2016   H/O syncope 03/06/2014   Occlusion and stenosis of left carotid artery 01/11/2014   Routine history and physical examination of adult 11/08/2013   History of repair of hip joint 04/20/2013   Hyperlipidemia 04/20/2013   Presence of artificial hip joint 04/20/2013   Vitamin D deficiency 06/18/2010   Concussion 01/24/2010   Elevated prostate specific antigen (PSA) 01/01/2007   Benign prostatic hyperplasia without urinary obstruction 04/22/2006   Osteoarthrosis 04/22/2006   Dyslipidemia 04/02/2006    ONSET DATE: 02/01/22  REFERRING DIAG:  R29.6 (ICD-10-CM) - Recurrent falls  S06.0X0A (ICD-10-CM) - Concussion without loss of consciousness, initial encounter    THERAPY DIAG:  Other lack of coordination - Plan: Ot plan of care cert/re-cert  Muscle weakness (generalized) - Plan: Ot plan of care cert/re-cert  Other abnormalities of gait and mobility - Plan: Ot plan of care cert/re-cert  Frontal lobe and executive function deficit - Plan: Ot plan of care cert/re-cert  Attention and concentration deficit - Plan: Ot plan of care cert/re-cert  Rationale for Evaluation and Treatment: Rehabilitation  SUBJECTIVE:   SUBJECTIVE STATEMENT: Pt reports difficulty donning socks Pt accompanied by: self  PERTINENT HISTORY: Past medical history significant for coronary artery disease, type 2 diabetes mellitus, previous MI, chronic systolic CHF, persistent A-fib  Had a fall on 02/01/22- tripped in a grocery  store   DIAGNOSTIC FINDINGS:  CT cervical spine:   No evidence of acute fracture or traumatic malalignment.   CT head:   1. No evidence of acute intracranial abnormality. 2. Stable chronic hypodense bilateral subdural collections. 3. Right forehead contusion.     PAIN:   PRECAUTIONS: Fall  WEIGHT BEARING RESTRICTIONS: No  PAIN:  Are you having pain? No  FALLS: Has patient fallen in last 6 months? Yes. Number of falls 2  LIVING ENVIRONMENT: Lives with: lives with their spouse Lives in: House/apartment Stairs: No Has following equipment at home:  built in shower bench  PLOF: Needs assistance with ADLs  PATIENT GOALS: increased ease with LB dressing  OBJECTIVE:   HAND DOMINANCE: Right  ADLs: Overall ADLs: needs assist with LB Transfers/ambulation related to ADLs:falls in grocery store Eating: mod I Grooming: mod I UB Dressing: mod I LB Dressing: needs help with socks Toileting: mod I Bathing: mod I Tub Shower transfers: mod I   IADLs: Shopping: needs assist, fell in grocery store x 2 Light housekeeping: pt's wife is doing more currently Meal Prep: cooks meals mod I Community mobility: falls in walking in grocery Medication management: keeps up with own meds Financial management: pt reports he handles  finances Handwriting: 90% legible  MOBILITY STATUS: Hx of falls      UPPER EXTREMITY ROM:  WFLS    UPPER EXTREMITY MMT:     MMT Right eval Left eval  Shoulder flexion 4/5 4+/5  Shoulder abduction    Shoulder adduction    Shoulder extension    Shoulder internal rotation    Shoulder external rotation    Middle trapezius    Lower trapezius    Elbow flexion 4+/5 4+/5  Elbow extension 4+/5 4+/5  Wrist flexion    Wrist extension    Wrist ulnar deviation    Wrist radial deviation    Wrist pronation    Wrist supination    (Blank rows = not tested)  HAND FUNCTION: Grip strength: Right: 53 lbs; Left: 73 lbs  COORDINATION: 9 Hole Peg  test: Right: 41.74 sec; Left: 33.53 sec Action tremor present SENSATION: WFL    COGNITION: Overall cognitive status: Impaired recall 2/3 items following short delay, may have delayed processing , cognition to be further addressed within a functional context   VISION: Subjective report: denies visual changes  Baseline vision: Wears glasses all the time   VISION ASSESSMENT: Not tested     OBSERVATIONS: action tremor bilateral UE's   TODAY'S TREATMENT:                                                                                                                              DATE: 03/11/22   PATIENT EDUCATION: Education details: role of OT, potential OT goals Person educated: Patient Education method: Explanation Education comprehension: verbalized understanding  HOME EXERCISE PROGRAM: N/A   GOALS: Goals reviewed with patient? Yes  SHORT TERM GOALS: Target date: 04/08/22  I with HEP for coordination and grip strength Baseline: Goal status: INITIAL  2.  Pt will verbalize understanding of compensatory strategies for short term memory deficits. Baseline: recalls 2/3 items following short delay Goal status: INITIAL  3.  Pt will increase RUE grip strength by 5 lbs for increased functional use Baseline: Grip strength: Right: 53 lbs; Left: 73 lbs Goal status: INITIAL   LONG TERM GOALS: Target date: 05/06/22  I with HEP for proximal strength Baseline:  Goal status: INITIAL  2.  Pt will demonstrate improved fine motor coordination for ADLS as evidenced by decreasing 9 hole peg test by 3 secs for RUE. Baseline: 9 Hole Peg test: Right: 41.74 sec; Left: 33.53 sec Goal status: INITIAL  3.  Pt will donn socks mod I. Baseline: needs assist Goal status: INITIAL  4.  Pt will resume prior level of home management activities demonstrating good safety awareness. Baseline: pt's wife is currently performing. Goal status: INITIAL    ASSESSMENT:  CLINICAL  IMPRESSION: Patient is a 82 y.o. male who was seen today for occupational therapy evaluation for  R29.6 (ICD-10-CM) - Recurrent falls  S06.0X0A (ICD-10-CM) - Concussion without loss of consciousness, initial encounter  .   Past medical history significant for  coronary artery disease, type 2 diabetes mellitus, previous MI, chronic systolic CHF, persistent A-fib  Had a fall on 02/01/22 and  tripped in a grocery store. Pt sustained a concussion. PERFORMANCE DEFICITS: in functional skills including ADLs, IADLs, coordination, dexterity, tone, ROM, strength, pain, flexibility, Fine motor control, Gross motor control, mobility, balance, endurance, decreased knowledge of precautions, decreased knowledge of use of DME, vision, and UE functional use, cognitive skills including attention, memory, problem solving, and thought, and psychosocial skills including coping strategies, environmental adaptation, habits, interpersonal interactions, and routines and behaviors.   IMPAIRMENTS: are limiting patient from ADLs, IADLs, play, leisure, and social participation.   CO-MORBIDITIES: may have co-morbidities  that affects occupational performance. Patient will benefit from skilled OT to address above impairments and improve overall function.  MODIFICATION OR ASSISTANCE TO COMPLETE EVALUATION: No modification of tasks or assist necessary to complete an evaluation.  OT OCCUPATIONAL PROFILE AND HISTORY: Problem focused assessment: Including review of records relating to presenting problem.  CLINICAL DECISION MAKING: LOW - limited treatment options, no task modification necessary  REHAB POTENTIAL: Good  EVALUATION COMPLEXITY: Low    PLAN:  OT FREQUENCY: 1x/week  OT DURATION: 8 weeks plus eval  PLANNED INTERVENTIONS: self care/ADL training, therapeutic exercise, therapeutic activity, neuromuscular re-education, manual therapy, passive range of motion, balance training, functional mobility training, moist heat,  patient/family education, cognitive remediation/compensation, energy conservation, coping strategies training, and DME and/or AE instructions  RECOMMENDED OTHER SERVICES: PT  CONSULTED AND AGREED WITH PLAN OF CARE: Patient  PLAN FOR NEXT SESSION: add more visits?, coordination/ grip strength HEP   Zhyon Antenucci, OT 03/11/2022, 11:26 AM

## 2022-03-13 NOTE — Therapy (Incomplete)
OUTPATIENT OCCUPATIONAL THERAPY NEURO Treatment  Patient Name: Raymond Christensen MRN: [id number] DOB:[birth year], 82 y.o., male Today's Date: 03/14/2022  PCP:Dr. Nani Ravens REFERRING PROVIDER: Dr. Nani Ravens  END OF SESSION:  OT End of Session - 03/14/22 0944     Visit Number 2    Number of Visits 17    Date for OT Re-Evaluation 05/06/22    Authorization Type generic Medicare    Authorization - Visit Number 2    Progress Note Due on Visit 10    OT Start Time [phone]    OT Stop Time 0930    OT Time Calculation (min) 49 min    Activity Tolerance Patient tolerated treatment well    Behavior During Therapy [facility] for tasks assessed/performed              Past Medical History:  Diagnosis Date   Arthritis    CAD (coronary artery disease)    Carotid artery disease ([place])    s/p left CEA [id number]   Chronic systolic CHF (congestive heart failure) ([place])    Concussion Summer 2012   Essential hypertension 02/26/2017   Heart disease    Hyperlipidemia    Hypothyroidism 10/15/2017   Major depressive disorder    Mitral valve regurgitation    Myocardial infarction ([place]) 1996, 2021   Obstructive sleep apnea on CPAP    Persistent atrial fibrillation ([facility])    Poor flexibility of tendon 12/01/2018   S/P mitral valve clip implantation 12/23/2019   s/p TEER with MitraClip with one XTW by Dr. Burt Knack   Subungual hematoma of digit of hand 12/01/2018   Subungual hematoma of right foot 12/01/2018   Thyroid disease    Type 2 diabetes mellitus without complication, without long-term current use of insulin ([place]) 02/26/2017   Past Surgical History:  Procedure Laterality Date   ATRIAL FIBRILLATION ABLATION N/A 02/01/2020   Procedure: ATRIAL FIBRILLATION ABLATION;  Surgeon: [name], MD;  Location: [place] [facility];  Service: Cardiovascular;  Laterality: N/A;   BYPASS GRAFT  2001   CARDIAC CATHETERIZATION     CARDIOVERSION N/A 11/08/2019   Procedure: CARDIOVERSION;  Surgeon: [name],  MD;  Location: [place];  Service: Cardiovascular;  Laterality: N/A;   CAROTID ENDARTERECTOMY Left 01/17/2014   CORONARY ANGIOPLASTY     CORONARY ARTERY BYPASS GRAFT     MITRAL VALVE REPAIR N/A 12/23/2019   Procedure: MITRAL VALVE REPAIR;  Surgeon: [name], MD;  Location: [place] [facility];  Service: Cardiovascular;  Laterality: N/A;   RIGHT/LEFT HEART CATH AND CORONARY/GRAFT ANGIOGRAPHY N/A 09/09/2019   Procedure: RIGHT/LEFT HEART CATH AND CORONARY/GRAFT ANGIOGRAPHY;  Surgeon: [name], MD;  Location: [place] [facility];  Service: Cardiovascular;  Laterality: N/A;   TEE WITHOUT CARDIOVERSION N/A 09/13/2019   Procedure: TRANSESOPHAGEAL ECHOCARDIOGRAM (TEE);  Surgeon: [name], MD;  [facility];  Service: Cardiovascular;  Laterality: N/A;   TEE WITHOUT CARDIOVERSION N/A 12/23/2019   Procedure: TRANSESOPHAGEAL ECHOCARDIOGRAM (TEE);  Surgeon: [name], MD;  Location: [place] [facility];  Service: Cardiovascular;  Laterality: N/A;   TEE WITHOUT CARDIOVERSION  02/01/2020   Procedure: TRANSESOPHAGEAL ECHOCARDIOGRAM (TEE);  Surgeon: [name], MD;  Location: [place] [facility];  Service: Cardiovascular;;   TOTAL HIP ARTHROPLASTY  1994, 1995   Patient Active Problem List   Diagnosis Date Noted   Lipoma of right upper extremity 03/02/2021   Trigger little finger of left hand 09/29/2020   Secondary hypercoagulable state ([place]) 02/29/2020   Depression, major, single  episode, moderate ([place]) 02/28/2020   Nonrheumatic tricuspid valve regurgitation    S/P mitral valve clip implantation 12/23/2019   Mitral valve disease 12/23/2019   Nonrheumatic mitral valve regurgitation    Persistent atrial fibrillation ([facility])    Chronic systolic CHF (congestive heart failure) ([facility])    Acute idiopathic gout involving toe of left foot    Thrombocytopenia ([place]) 09/08/2019   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  ([place])    Diabetes mellitus type 2 in obese ([place]) 06/22/2019   Major depressive disorder    Obstructive sleep apnea on CPAP    Hypothyroidism 10/15/2017   Subclavian artery stenosis, right ([place]) 06/15/2017   Type 2 diabetes mellitus without complication, without long-term current use of insulin ([place]) 02/26/2017   Severe obesity (BMI 35.0-39.9) with comorbidity ([place]) 02/26/2017   Essential hypertension 02/26/2017   Memory change 01/10/2017   Urge incontinence of urine 01/10/2017   Lightheaded 06/25/2016   AK (actinic keratosis) 01/03/2016   H/O syncope 03/06/2014   Occlusion and stenosis of left carotid artery 01/11/2014   Routine history and physical examination of adult 11/08/2013   History of repair of hip joint 04/20/2013   Hyperlipidemia 04/20/2013   Presence of artificial hip joint 04/20/2013   Vitamin D deficiency 06/18/2010   Concussion 01/24/2010   Elevated prostate specific antigen (PSA) 01/01/2007   Benign prostatic hyperplasia without urinary obstruction 04/22/2006   Osteoarthrosis 04/22/2006   Dyslipidemia 04/02/2006    ONSET DATE: 02/01/22  REFERRING DIAG:  R29.6 (ICD-10-CM) - Recurrent falls  S06.0X0A (ICD-10-CM) - Concussion without loss of consciousness, initial encounter    THERAPY DIAG:  Muscle weakness (generalized) - Plan: Ot plan of care cert/re-cert  Other lack of coordination - Plan: Ot plan of care cert/re-cert  Other abnormalities of gait and mobility - Plan: Ot plan of care cert/re-cert  Frontal lobe and executive function deficit - Plan: Ot plan of care cert/re-cert  Attention and concentration deficit - Plan: Ot plan of care cert/re-cert  Rationale for Evaluation and Treatment: Rehabilitation  SUBJECTIVE:   SUBJECTIVE STATEMENT: Pt denies pain Pt accompanied by: self  PERTINENT HISTORY: Past medical history significant for coronary artery disease, type 2 diabetes mellitus, previous MI, chronic systolic CHF, persistent A-fib  Had a fall  on 02/01/22- tripped in a grocery store   DIAGNOSTIC FINDINGS:  CT cervical spine:   No evidence of acute fracture or traumatic malalignment.   CT head:   1. No evidence of acute intracranial abnormality. 2. Stable chronic hypodense bilateral subdural collections. 3. Right forehead contusion.     PAIN:   PRECAUTIONS: Fall  WEIGHT BEARING RESTRICTIONS: No  PAIN:  Are you having pain? No  FALLS: Has patient fallen in last 6 months? Yes. Number of falls 2  LIVING ENVIRONMENT: Lives with: lives with their spouse Lives in: House/apartment Stairs: No Has following equipment at home:  built in shower bench  PLOF: Needs assistance with ADLs  PATIENT GOALS: increased ease with LB dressing  OBJECTIVE:   HAND DOMINANCE: Right  ADLs: Overall ADLs: needs assist with LB Transfers/ambulation related to ADLs:falls in grocery store Eating: mod I Grooming: mod I UB Dressing: mod I LB Dressing: needs help with socks Toileting: mod I Bathing: mod I Tub Shower transfers: mod I   IADLs: Shopping: needs assist, fell in grocery store x 2 Light housekeeping: pt's wife is doing more currently Meal Prep: cooks meals mod I Community mobility: falls in walking in grocery Medication management:  keeps up with own meds Financial management: pt reports he handles finances Handwriting: 90% legible  MOBILITY STATUS: Hx of falls      UPPER EXTREMITY ROM:  WFLS    UPPER EXTREMITY MMT:     MMT Right eval Left eval  Shoulder flexion 4/5 4+/5  Shoulder abduction    Shoulder adduction    Shoulder extension    Shoulder internal rotation    Shoulder external rotation    Middle trapezius    Lower trapezius    Elbow flexion 4+/5 4+/5  Elbow extension 4+/5 4+/5  Wrist flexion    Wrist extension    Wrist ulnar deviation    Wrist radial deviation    Wrist pronation    Wrist supination    (Blank rows = not tested)  HAND FUNCTION: Grip strength: Right: 53 lbs; Left: 73  lbs  COORDINATION: 9 Hole Peg test: Right: 41.74 sec; Left: 33.53 sec Action tremor present SENSATION: WFL    COGNITION: Overall cognitive status: Impaired recall 2/3 items following short delay, may have delayed processing , cognition to be further addressed within a functional context   VISION: Subjective report: denies visual changes  Baseline vision: Wears glasses all the time   VISION ASSESSMENT: Not tested     OBSERVATIONS: action tremor bilateral UE's   TODAY'S TREATMENT:                                                                                                                              DATE: 03/14/22 - Pt was instructed in HEP for fine motor coordination and green putty for grip strength. Pt returned demonstration with min v.c . Pt was cautioned to toss ball over a table and not to chase after the ball if he drops it due to fall risk. Handwriting activity practicing with and without  using foam grip on pen and cues to support elbow on tabletop. Pt with improved handwriting using foam grip and cues to slow down and deliberately form each letter.  Gripper set at level 4 block spring for sustained grip to pick up 1 inch blocks, 2-3 rest breaks required due to fatigue. Arm bike x 5 min s level 3 for conditioning.  PATIENT EDUCATION:Education details: theraputty HEP Person educated: Patient Education method: Explanation, Demonstration, Verbal cues, and Handouts Education comprehension: verbalized understanding, returned demonstration, and verbal cues required  HOME EXERCISE PROGRAM: N/A   GOALS: Goals reviewed with patient? Yes  SHORT TERM GOALS: Target date: 04/08/22  I with HEP for coordination and grip strength Baseline: Goal status: ongoing- HEP issued 03/14/22  2.  Pt will verbalize understanding of compensatory strategies for short term memory deficits. Baseline: recalls 2/3 items following short delay Goal status: ongoing  3.  Pt will increase RUE  grip strength by 5 lbs for increased functional use Baseline: Grip strength: Right: 53 lbs; Left: 73 lbs Goal status:ongoing- HEP issued 03/14/22   LONG TERM GOALS: Target date: 05/06/22  I with HEP for proximal strength Baseline:  Goal status: INITIAL  2.  Pt will demonstrate improved fine motor coordination for ADLS as evidenced by decreasing 9 hole peg test by 3 secs for RUE. Baseline: 9 Hole Peg test: Right: 41.74 sec; Left: 33.53 sec Goal status: INITIAL  3.  Pt will donn socks mod I. Baseline: needs assist Goal status: INITIAL  4.  Pt will resume prior level of home management activities demonstrating good safety awareness. Baseline: pt's wife is currently performing. Goal status: INITIAL    ASSESSMENT:  CLINICAL IMPRESSION: Pt is progressing towards goals. He demonstrates understanding of initial HEP for coordination and grip strength. PERFORMANCE DEFICITS: in functional skills including ADLs, IADLs, coordination, dexterity, tone, ROM, strength, pain, flexibility, Fine motor control, Gross motor control, mobility, balance, endurance, decreased knowledge of precautions, decreased knowledge of use of DME, vision, and UE functional use, cognitive skills including attention, memory, problem solving, and thought, and psychosocial skills including coping strategies, environmental adaptation, habits, interpersonal interactions, and routines and behaviors.   IMPAIRMENTS: are limiting patient from ADLs, IADLs, play, leisure, and social participation.   CO-MORBIDITIES: may have co-morbidities  that affects occupational performance. Patient will benefit from skilled OT to address above impairments and improve overall function.  MODIFICATION OR ASSISTANCE TO COMPLETE EVALUATION: No modification of tasks or assist necessary to complete an evaluation.  OT OCCUPATIONAL PROFILE AND HISTORY: Problem focused assessment: Including review of records relating to presenting problem.  CLINICAL  DECISION MAKING: LOW - limited treatment options, no task modification necessary  REHAB POTENTIAL: Good  EVALUATION COMPLEXITY: Low    PLAN:  OT FREQUENCY:2x/ week - resent to update frequency  OT DURATION: 8 weeks plus eval  PLANNED INTERVENTIONS: self care/ADL training, therapeutic exercise, therapeutic activity, neuromuscular re-education, manual therapy, passive range of motion, balance training, functional mobility training, moist heat, patient/family education, cognitive remediation/compensation, energy conservation, coping strategies training, and DME and/or AE instructions  RECOMMENDED OTHER SERVICES: PT  CONSULTED AND AGREED WITH PLAN OF CARE: Patient  PLAN FOR NEXT SESSION: practice with sock aide, memory compensations, UE strength and endurance, Elliyah Liszewski, OT 03/14/2022, 10:04 AM

## 2022-03-14 ENCOUNTER — Ambulatory Visit: Payer: Medicare (Managed Care) | Admitting: Occupational Therapy

## 2022-03-14 ENCOUNTER — Ambulatory Visit: Payer: Medicare (Managed Care) | Admitting: Physical Therapy

## 2022-03-14 DIAGNOSIS — R278 Other lack of coordination: Secondary | ICD-10-CM

## 2022-03-14 DIAGNOSIS — R2689 Other abnormalities of gait and mobility: Secondary | ICD-10-CM

## 2022-03-14 DIAGNOSIS — R262 Difficulty in walking, not elsewhere classified: Secondary | ICD-10-CM

## 2022-03-14 DIAGNOSIS — M6281 Muscle weakness (generalized): Secondary | ICD-10-CM

## 2022-03-14 DIAGNOSIS — R41844 Frontal lobe and executive function deficit: Secondary | ICD-10-CM

## 2022-03-14 DIAGNOSIS — R41840 Attention and concentration deficit: Secondary | ICD-10-CM

## 2022-03-14 NOTE — Therapy (Signed)
OUTPATIENT PHYSICAL THERAPY LOWER EXTREMITY TREATMENT   Patient Name: Raymond Christensen MRN: [id number] DOB:1940/04/24, 82 y.o., male Today's Date: 03/14/2022  END OF SESSION:  PT End of Session - 03/14/22 0934     Visit Number 7    Date for PT Re-Evaluation 05/09/22    PT Start Time 0930    PT Stop Time 1010    PT Time Calculation (min) 40 min    Activity Tolerance Patient tolerated treatment well    Behavior During Therapy [facility] for tasks assessed/performed               Past Medical History:  Diagnosis Date   Arthritis    CAD (coronary artery disease)    Carotid artery disease ([place])    s/p left CEA 123456   Chronic systolic CHF (congestive heart failure) ([place])    Concussion Summer 2012   Essential hypertension 02/26/2017   Heart disease    Hyperlipidemia    Hypothyroidism 10/15/2017   Major depressive disorder    Mitral valve regurgitation    Myocardial infarction ([place]) 1996, 2021   Obstructive sleep apnea on CPAP    Persistent atrial fibrillation (HCC)    Poor flexibility of tendon 12/01/2018   S/P mitral valve clip implantation 12/23/2019   s/p TEER with MitraClip with one XTW by Dr. Burt Knack   Subungual hematoma of digit of hand 12/01/2018   Subungual hematoma of right foot 12/01/2018   Thyroid disease    Type 2 diabetes mellitus without complication, without long-term current use of insulin ([place]) 02/26/2017   Past Surgical History:  Procedure Laterality Date   ATRIAL FIBRILLATION ABLATION N/A 02/01/2020   Procedure: ATRIAL FIBRILLATION ABLATION;  Surgeon: [name], MD;  Location: [facility];  Service: Cardiovascular;  Laterality: N/A;   BYPASS GRAFT  2001   CARDIAC CATHETERIZATION     CARDIOVERSION N/A 11/08/2019   Procedure: CARDIOVERSION;  Surgeon: [name], MD;  Location: [facility];  Service: Cardiovascular;  Laterality: N/A;   CAROTID ENDARTERECTOMY Left 01/17/2014   CORONARY ANGIOPLASTY     CORONARY ARTERY BYPASS GRAFT      MITRAL VALVE REPAIR N/A 12/23/2019   Procedure: MITRAL VALVE REPAIR;  Surgeon: [name], MD;  Location: [facility];  Service: Cardiovascular;  Laterality: N/A;   RIGHT/LEFT HEART CATH AND CORONARY/GRAFT ANGIOGRAPHY N/A 09/09/2019   Procedure: RIGHT/LEFT HEART CATH AND CORONARY/GRAFT ANGIOGRAPHY;  Surgeon: [name], MD;  Location: [facility];  Service: Cardiovascular;  Laterality: N/A;   TEE WITHOUT CARDIOVERSION N/A 09/13/2019   Procedure: TRANSESOPHAGEAL ECHOCARDIOGRAM (TEE);  Surgeon: [name], MD;  Location: [facility];  Service: Cardiovascular;  Laterality: N/A;   TEE WITHOUT CARDIOVERSION N/A 12/23/2019   Procedure: TRANSESOPHAGEAL ECHOCARDIOGRAM (TEE);  Surgeon: [name], MD;  Location: [facility];  Service: Cardiovascular;  Laterality: N/A;   TEE WITHOUT CARDIOVERSION  02/01/2020   Procedure: TRANSESOPHAGEAL ECHOCARDIOGRAM (TEE);  Surgeon: [name], MD;  Location: [facility];  Service: Cardiovascular;;   TOTAL HIP ARTHROPLASTY  1994, 1995   Patient Active Problem List   Diagnosis Date Noted   Lipoma of right upper extremity 03/02/2021   Trigger little finger of left hand 09/29/2020   Secondary hypercoagulable state ([place]) 02/29/2020   Depression, major, single episode, moderate (HCC) 02/28/2020   Nonrheumatic tricuspid valve regurgitation    S/P mitral valve clip implantation 12/23/2019   Mitral valve disease 12/23/2019   Nonrheumatic mitral valve regurgitation    Persistent  atrial fibrillation (HCC)    Chronic systolic CHF (congestive heart failure) (HCC)    Acute idiopathic gout involving toe of left foot    Thrombocytopenia ([place]) 09/08/2019   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 ([place])    Diabetes mellitus type 2 in obese ([place]) 06/22/2019   Major depressive disorder    Obstructive sleep apnea on CPAP    Hypothyroidism 10/15/2017   Subclavian artery  stenosis, right ([place]) 06/15/2017   Type 2 diabetes mellitus without complication, without long-term current use of insulin ([place]) 02/26/2017   Severe obesity (BMI 35.0-39.9) with comorbidity ([place]) 02/26/2017   Essential hypertension 02/26/2017   Memory change 01/10/2017   Urge incontinence of urine 01/10/2017   Lightheaded 06/25/2016   AK (actinic keratosis) 01/03/2016   H/O syncope 03/06/2014   Occlusion and stenosis of left carotid artery 01/11/2014   Routine history and physical examination of adult 11/08/2013   History of repair of hip joint 04/20/2013   Hyperlipidemia 04/20/2013   Presence of artificial hip joint 04/20/2013   Vitamin D deficiency 06/18/2010   Concussion 01/24/2010   Elevated prostate specific antigen (PSA) 01/01/2007   Benign prostatic hyperplasia without urinary obstruction 04/22/2006   Osteoarthrosis 04/22/2006   Dyslipidemia 04/02/2006    PCP: Riki Sheer  REFERRING PROVIDER: Riki Sheer  REFERRING DIAG: M53.3- Sacroiliac dysfunction  THERAPY DIAG:  Muscle weakness (generalized)  Other lack of coordination  Other abnormalities of gait and mobility  Difficulty in walking, not elsewhere classified  Rationale for Evaluation and Treatment: Rehabilitation  ONSET DATE: 12/26/21  SUBJECTIVE:   SUBJECTIVE STATEMENT: Patient reports some soreness in his hips after last treatment.  PERTINENT HISTORY: Past medical history significant for coronary artery disease, type 2 diabetes mellitus, previous MI, chronic systolic CHF, persistent A-fib  Had a fall on 02/01/22- tripped in a grocery store  PAIN:  Are you having pain? Yes: NPRS scale: 0/10 Pain location: head, shoulder, hip Pain description: sharp if I press on it other than that it is just dull Aggravating factors: nothing specifically Relieving factors: medicine  PRECAUTIONS: None  WEIGHT BEARING RESTRICTIONS: No  FALLS:  Has patient fallen in last 6 months? Yes. Number of falls  2  LIVING ENVIRONMENT: Lives with: lives with their spouse Lives in: House/apartment Stairs: No Has following equipment at home: Environmental consultant - 2 wheeled  OCCUPATION: Retired  PLOF: Independent  PATIENT GOALS: I want to make sure I can become ambulatory again and get around without the walker  NEXT MD VISIT:   OBJECTIVE:   DIAGNOSTIC FINDINGS:  CT cervical spine:   No evidence of acute fracture or traumatic malalignment.  CT head:   1. No evidence of acute intracranial abnormality. 2. Stable chronic hypodense bilateral subdural collections. 3. Right forehead contusion.  IMPRESSION: Negative.  COGNITION: Overall cognitive status: Within functional limits for tasks assessed, some delay in response and wife reports some memory deficits    SENSATION: WFL   MUSCLE LENGTH: Hamstrings:bilateral tightness, more in LLE  POSTURE: rounded shoulders, forward head, and flexed trunk   PALPATION: Tightness in bilateral UT, more in R side LOWER EXTREMITY ROM: grossly WFL some pain with R side hip flexion    LOWER EXTREMITY MMT: grossly 4/5    FUNCTIONAL TESTS:  5 times sit to stand: 21.31s with UE use Timed up and go (TUG): 22.27s  GAIT: Distance walked: in clinic distances Assistive device utilized: Walker - 2 wheeled Level of assistance: Modified independence Comments: shuffling feet, decreased  stance time bilaterally, decrease stride length, poor foot clearance   TODAY'S TREATMENT:                                                                                                                              DATE:  03/14/22 NuStep L5 x 4 minutes B side stepping onto and off Air Ex pad x 10 each direction. No UE support, S for balance. Step back with RLE, rotating to R to place object on mat behind, then return forward. % x to each side, no difficulty with balance. Alternate taps on cone on the floor x 10, mildly unsteady.  Progressed to multiple taps with each foot, mod  difficulty occasional light min A for balance. Floor ladder- walking with one foot in each square x 4, side stepping into each square x 2, changing direction upon therapist command x 1 Walking on floor mat, forward, back, side stepping, turning, slow walking, CGA, mildly unsteady, but no LOB.   03/11/22 NuStep L5 x 6 minutes. Trunk mobilization-seated, rotate to L, walk hands out and back x 4, repeat to R Seated HS stretch, 3 x 15 sec each leg Supine stretch with strap, Abd, Add, figure 4, ITB, LTR, 3 x 15 sec each. Balance/mobility training in the parallel bars-crossover and back with trunk rotation, minimize UE support, but required at least UUE. Quick steps side to side x 30 seconds Ambulation with quick changes of direction, forward, back, side to side, increased speed, approximately 200'   03/07/22 Bike L3 x42mns  2 way hip 3# 2x10 Marching on airex in // bars  Side steps on beam in // bars Narrow walking on beam in // bars Shoulder ext 10# 2x10 STS with OHP yellow 2x10 Leg press 40# 2x10 Calf raises 2x10   03/05/22 Spoke with him and answered their questions STS with weighted yellow ball 2x5 Cone toe touches with light HHA Nustep Level 5 x 6 minutes Seated pball roll outs for back stretches Side step over objects, side step on and off airex In pbars, marching, airex balance beam side stepping and tandem walking Fast gait 250 feet Ball toss, volleyball Direction changes   03/01/22 NuStep L5 x669ms Feet on pball trunk rotations, knees to chest x10 Bridges 2x10  STS on airex 2x10 Box taps 6" Side steps over obstacles Step ups on airex- minA   02/26/22 Bike L2 x 5 minutes Supine DKTC over physioball x 5, hold 5 sec Supine bridge over physioball, repeat with B hip IR, 10 each Seated clamshells and active add for lateral hip strengthening and stretch x 10, G tband. Sit to stqnd on Air ex pad, holding 2# ball in BUE with OHP at top. Min TC and VC to lean forward  sufficiently to balance. 10 reps. B side to side stepping against G Tband resistance, B HHA, 2 x 10 reps each direction. B side stepping over dowels on the floor. Heel raises on black bar, 20  reps, BUE support for balance. Ambulation, walking x approximately 200', including multiple turns, avoiding obstacles, backwards stepping. No AD, no unsteadiness noted.  02/14/22- EVAL    PATIENT EDUCATION:  Education details: POC and HEP Person educated: Patient and Spouse Education method: Explanation Education comprehension: verbalized understanding  HOME EXERCISE PROGRAM:  Access Code: [email or web address] URL: [email or web address] Date: 02/14/2022 Prepared by: Andris Baumann  Exercises - Sit to Stand with Arms Crossed  - 1 x daily - 7 x weekly - 2 sets - 10 reps - Standing March with Counter Support  - 1 x daily - 7 x weekly - 2 sets - 10 reps - Standing Hip Abduction with Unilateral Counter Support  - 1 x daily - 7 x weekly - 2 sets - 10 reps - Standing with Head Rotation  - 1 x daily - 7 x weekly - 2 sets - 5 reps - 20 hold - Romberg Stance with Head Nods  - 1 x daily - 7 x weekly - 2 sets - 5 reps - 20 hold  ASSESSMENT:  CLINICAL IMPRESSION: Patient reports no new issues. He was still sore from last treatment, so focused on balance today with SLS, unstable surfaces, direction changes. He tolerated all with mild unsteadiness, but improved balance.  OBJECTIVE IMPAIRMENTS: Abnormal gait, decreased balance, decreased mobility, difficulty walking, decreased strength, and pain.   ACTIVITY LIMITATIONS: bending, stairs, transfers, bed mobility, and locomotion level  PARTICIPATION LIMITATIONS: driving, shopping, and yard work  [employer] POTENTIAL: Good  CLINICAL DECISION MAKING: Stable/uncomplicated  EVALUATION COMPLEXITY: Low   GOALS: Goals reviewed with patient? Yes  SHORT TERM GOALS: Target date: 03/28/22  Patient will be independent with initial HEP. Goal status: met  2.   Patient will demonstrate decreased fall risk by scoring < 15 sec on TUG. Baseline: 22.27s Goal status: ongoing  3.  Patient will be educated on strategies to decrease risk of falls.  Goal status: INITIAL   LONG TERM GOALS: Target date: 05/09/22  Patient will be independent with advanced/ongoing HEP to improve outcomes and carryover.  Goal status: INITIAL  2.  Patient will be able to ambulate 600' with LRAD with good safety to access community.  Baseline: using RW after fall Goal status: 03/14/22 ongoing  3.  Patient will demonstrate improved functional LE strength as demonstrated by decreased 5xSTS to <14s. Baseline: 21.21s Goal status: 03/14/22 ongoing  4.  Patient will score 46 on Berg Balance test to demonstrate lower risk of falls. (MCID= 8 points) .  Baseline: 38 Goal status: INITIAL   PLAN:  PT FREQUENCY: 1-2x/week  PT DURATION: 12 weeks  PLANNED INTERVENTIONS: Therapeutic exercises, Therapeutic activity, Neuromuscular re-education, Balance training, Gait training, Patient/Family education, Self Care, Joint mobilization, Stair training, Dry Needling, Electrical stimulation, Cryotherapy, Moist heat, Vasopneumatic device, Ionotophoresis '4mg'$ /ml Dexamethasone, and Manual therapy  PLAN FOR NEXT SESSION: start gym activities, work on strength and balance  Ethel Rana DPT 03/14/22 10:10 AM

## 2022-03-14 NOTE — Patient Instructions (Signed)
  Coordination Activities  Perform the following activities for 10-20 minutes 1 times per day with right hand(s).  Rotate ball in fingertips (clockwise and counter-clockwise). Toss ball between hands. Toss ball in air and catch with the same hand. Flip cards 1 at a time as fast as you can. Deal cards with your thumb (Hold deck in hand and push card off top with thumb). Twirl pen between fingers. Practice writing and/or typing.     1. Grip Strengthening (Resistive Putty)   Squeeze putty using thumb and all fingers. Repeat _20___ times. Do __2__ sessions per day.   2. Roll putty into tube on table and pinch between first two fingers and thumb x 10 reps. Do 2 sessions per day     Copyright  VHI. All rights reserved.

## 2022-03-15 ENCOUNTER — Ambulatory Visit: Payer: Medicare (Managed Care) | Admitting: Family Medicine

## 2022-03-15 ENCOUNTER — Encounter: Payer: Self-pay | Admitting: Family Medicine

## 2022-03-15 VITALS — BP 110/74 | HR 53 | Temp 97.8°F | Ht 68.0 in | Wt 215.1 lb

## 2022-03-15 DIAGNOSIS — I10 Essential (primary) hypertension: Secondary | ICD-10-CM

## 2022-03-15 NOTE — Patient Instructions (Addendum)
Keep an eye on your blood pressure at home. I think fatigue will improve with a bit higher levels.   Keep the diet clean and stay active.  Strong work with your weight loss.  Do not stop your Lasix for now.   Let us know if you need anything.

## 2022-03-15 NOTE — Progress Notes (Signed)
Chief Complaint  Patient presents with   Follow-up    Blood pressure     Subjective Raymond Christensen is a 82 y.o. male who presents for hypertension follow up. Here w his spouse.  He does monitor home blood pressures. Blood pressures ranging from 90-100's/60's on average. He is compliant with medication- Aldactone 12.5 mg/d, Entresto. Patient has these side effects of medication: fatigue He is  adhering to a healthy diet overall. Current exercise: HEP No CP or SOB.    Past Medical History:  Diagnosis Date   Arthritis    CAD (coronary artery disease)    Carotid artery disease ([place])    s/p left CEA 123456   Chronic systolic CHF (congestive heart failure) ([place])    Concussion Summer 2012   Essential hypertension 02/26/2017   Heart disease    Hyperlipidemia    Hypothyroidism 10/15/2017   Major depressive disorder    Mitral valve regurgitation    Myocardial infarction [facility]) 1996, 2021   Obstructive sleep apnea on CPAP    Persistent atrial fibrillation (HCC)    Poor flexibility of tendon 12/01/2018   S/P mitral valve clip implantation 12/23/2019   s/p TEER with MitraClip with one XTW by Dr. Burt Knack   Subungual hematoma of digit of hand 12/01/2018   Subungual hematoma of right foot 12/01/2018   Thyroid disease    Type 2 diabetes mellitus without complication, without long-term current use of insulin (HCC) 02/26/2017    Exam BP 110/74 (BP Location: Left Arm, Patient Position: Sitting, Cuff Size: Normal)   Pulse (!) 53   Temp 97.8 F (36.6 C) (Oral)   Ht '5\' 8"'$  (1.727 m)   Wt 215 lb 2 oz (97.6 kg)   SpO2 95%   BMI 32.71 kg/m  General:  well developed, well nourished, in no apparent distress Heart: reg rhythm, bradycardic, no bruits, no LE edema Lungs: clear to auscultation, no accessory muscle use Psych: well oriented with normal range of affect and appropriate judgment/insight  Essential hypertension - Plan: Basic metabolic panel  Adverse effect of med. Stop Aldactone.  Cont Entresto. Counseled on diet and exercise. F/u in 1 week for BMP. The patient and his spouse voiced understanding and agreement to the plan.  Harris, DO 03/15/22  8:55 AM

## 2022-03-19 ENCOUNTER — Ambulatory Visit: Payer: Medicare (Managed Care) | Attending: Family Medicine | Admitting: Physical Therapy

## 2022-03-19 ENCOUNTER — Encounter: Payer: Self-pay | Admitting: Physical Therapy

## 2022-03-19 ENCOUNTER — Ambulatory Visit: Payer: Medicare (Managed Care) | Admitting: Occupational Therapy

## 2022-03-19 DIAGNOSIS — R41844 Frontal lobe and executive function deficit: Secondary | ICD-10-CM | POA: Diagnosis present

## 2022-03-19 DIAGNOSIS — R278 Other lack of coordination: Secondary | ICD-10-CM | POA: Insufficient documentation

## 2022-03-19 DIAGNOSIS — M6281 Muscle weakness (generalized): Secondary | ICD-10-CM | POA: Diagnosis present

## 2022-03-19 DIAGNOSIS — R296 Repeated falls: Secondary | ICD-10-CM | POA: Diagnosis present

## 2022-03-19 DIAGNOSIS — R262 Difficulty in walking, not elsewhere classified: Secondary | ICD-10-CM | POA: Diagnosis present

## 2022-03-19 DIAGNOSIS — R41840 Attention and concentration deficit: Secondary | ICD-10-CM

## 2022-03-19 DIAGNOSIS — R2689 Other abnormalities of gait and mobility: Secondary | ICD-10-CM

## 2022-03-19 DIAGNOSIS — Z9181 History of falling: Secondary | ICD-10-CM | POA: Insufficient documentation

## 2022-03-19 NOTE — Therapy (Signed)
OUTPATIENT PHYSICAL THERAPY LOWER EXTREMITY TREATMENT   Patient Name: Raymond Christensen MRN: [id number] DOB:05-10-1940, 82 y.o., male Today's Date: 03/19/2022  END OF SESSION:  PT End of Session - 03/19/22 1139     Visit Number 8    Date for PT Re-Evaluation 05/09/22    PT Start Time 1139    PT Stop Time 1220    PT Time Calculation (min) 41 min    Activity Tolerance Patient tolerated treatment well    Behavior During Therapy [facility] for tasks assessed/performed               Past Medical History:  Diagnosis Date   Arthritis    CAD (coronary artery disease)    Carotid artery disease ([place])    s/p left CEA [id number]   Chronic systolic CHF (congestive heart failure) ([place])    Concussion Summer 2012   Essential hypertension 02/26/2017   Heart disease    Hyperlipidemia    Hypothyroidism 10/15/2017   Major depressive disorder    Mitral valve regurgitation    Myocardial infarction ([place]) 1996, 2021   Obstructive sleep apnea on CPAP    Persistent atrial fibrillation (HCC)    Poor flexibility of tendon 12/01/2018   S/P mitral valve clip implantation 12/23/2019   s/p TEER with MitraClip with one XTW by Dr. Burt Knack   Subungual hematoma of digit of hand 12/01/2018   Subungual hematoma of right foot 12/01/2018   Thyroid disease    Type 2 diabetes mellitus without complication, without long-term current use of insulin ([place]) 02/26/2017   Past Surgical History:  Procedure Laterality Date   ATRIAL FIBRILLATION ABLATION N/A 02/01/2020   Procedure: ATRIAL FIBRILLATION ABLATION;  Surgeon: [name], MD;  Location: [place] [facility];  Service: Cardiovascular;  Laterality: N/A;   BYPASS GRAFT  2001   CARDIAC CATHETERIZATION     CARDIOVERSION N/A 11/08/2019   Procedure: CARDIOVERSION;  Surgeon: [name], MD;  Location: [place];  Service: Cardiovascular;  Laterality: N/A;   CAROTID ENDARTERECTOMY Left 01/17/2014   CORONARY ANGIOPLASTY     CORONARY ARTERY BYPASS GRAFT      MITRAL VALVE REPAIR N/A 12/23/2019   Procedure: MITRAL VALVE REPAIR;  Surgeon: [name], MD;  Location: [place] [facility];  Service: Cardiovascular;  Laterality: N/A;   RIGHT/LEFT HEART CATH AND CORONARY/GRAFT ANGIOGRAPHY N/A 09/09/2019   Procedure: RIGHT/LEFT HEART CATH AND CORONARY/GRAFT ANGIOGRAPHY;  Surgeon: [name], MD;  Location: [place] [facility];  Service: Cardiovascular;  Laterality: N/A;   TEE WITHOUT CARDIOVERSION N/A 09/13/2019   Procedure: TRANSESOPHAGEAL ECHOCARDIOGRAM (TEE);  Surgeon: [name], MD;  Location: [facility];  Service: Cardiovascular;  Laterality: N/A;   TEE WITHOUT CARDIOVERSION N/A 12/23/2019   Procedure: TRANSESOPHAGEAL ECHOCARDIOGRAM (TEE);  Surgeon: [name], MD;  Location: [place] [facility];  Service: Cardiovascular;  Laterality: N/A;   TEE WITHOUT CARDIOVERSION  02/01/2020   Procedure: TRANSESOPHAGEAL ECHOCARDIOGRAM (TEE);  Surgeon: [name], MD;  Location: [place] [facility];  Service: Cardiovascular;;   TOTAL HIP ARTHROPLASTY  1994, 1995   Patient Active Problem List   Diagnosis Date Noted   Lipoma of right upper extremity 03/02/2021   Trigger little finger of left hand 09/29/2020   Secondary hypercoagulable state ([place]) 02/29/2020   Depression, major, single episode, moderate (HCC) 02/28/2020   Nonrheumatic tricuspid valve regurgitation    S/P mitral valve clip implantation 12/23/2019   Mitral valve disease 12/23/2019   Nonrheumatic mitral valve regurgitation    Persistent  atrial fibrillation (HCC)    Chronic systolic CHF (congestive heart failure) (HCC)    Acute idiopathic gout involving toe of left foot    Thrombocytopenia ([place]) 09/08/2019   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 ([place])    Diabetes mellitus type 2 in obese ([place]) 06/22/2019   Major depressive disorder    Obstructive sleep apnea on CPAP    Hypothyroidism 10/15/2017   Subclavian artery  stenosis, right ([place]) 06/15/2017   Type 2 diabetes mellitus without complication, without long-term current use of insulin ([place]) 02/26/2017   Severe obesity (BMI 35.0-39.9) with comorbidity ([place]) 02/26/2017   Essential hypertension 02/26/2017   Memory change 01/10/2017   Urge incontinence of urine 01/10/2017   Lightheaded 06/25/2016   AK (actinic keratosis) 01/03/2016   H/O syncope 03/06/2014   Occlusion and stenosis of left carotid artery 01/11/2014   Routine history and physical examination of adult 11/08/2013   History of repair of hip joint 04/20/2013   Hyperlipidemia 04/20/2013   Presence of artificial hip joint 04/20/2013   Vitamin D deficiency 06/18/2010   Concussion 01/24/2010   Elevated prostate specific antigen (PSA) 01/01/2007   Benign prostatic hyperplasia without urinary obstruction 04/22/2006   Osteoarthrosis 04/22/2006   Dyslipidemia 04/02/2006    PCP: Riki Sheer  REFERRING PROVIDER: Riki Sheer  REFERRING DIAG: M53.3- Sacroiliac dysfunction  THERAPY DIAG:  Muscle weakness (generalized)  Other lack of coordination  Other abnormalities of gait and mobility  Difficulty in walking, not elsewhere classified  Repeated falls  History of falling  Rationale for Evaluation and Treatment: Rehabilitation  ONSET DATE: 12/26/21  SUBJECTIVE:   SUBJECTIVE STATEMENT: "Pretty good" Some soreness in he back and shoulders  PERTINENT HISTORY: Past medical history significant for coronary artery disease, type 2 diabetes mellitus, previous MI, chronic systolic CHF, persistent A-fib  Had a fall on 02/01/22- tripped in a grocery store  PAIN:  Are you having pain? Yes: NPRS scale: 0/10 Pain location: head, shoulder, hip Pain description: sharp if I press on it other than that it is just dull Aggravating factors: nothing specifically Relieving factors: medicine  PRECAUTIONS: None  WEIGHT BEARING RESTRICTIONS: No  FALLS:  Has patient fallen in last 6  months? Yes. Number of falls 2  LIVING ENVIRONMENT: Lives with: lives with their spouse Lives in: House/apartment Stairs: No Has following equipment at home: Environmental consultant - 2 wheeled  OCCUPATION: Retired  PLOF: Independent  PATIENT GOALS: I want to make sure I can become ambulatory again and get around without the walker  NEXT MD VISIT:   OBJECTIVE:   DIAGNOSTIC FINDINGS:  CT cervical spine:   No evidence of acute fracture or traumatic malalignment.  CT head:   1. No evidence of acute intracranial abnormality. 2. Stable chronic hypodense bilateral subdural collections. 3. Right forehead contusion.  IMPRESSION: Negative.  COGNITION: Overall cognitive status: Within functional limits for tasks assessed, some delay in response and wife reports some memory deficits    SENSATION: WFL   MUSCLE LENGTH: Hamstrings:bilateral tightness, more in LLE  POSTURE: rounded shoulders, forward head, and flexed trunk   PALPATION: Tightness in bilateral UT, more in R side LOWER EXTREMITY ROM: grossly WFL some pain with R side hip flexion    LOWER EXTREMITY MMT: grossly 4/5    FUNCTIONAL TESTS:  5 times sit to stand: 21.31s with UE use Timed up and go (TUG): 22.27s  GAIT: Distance walked: in clinic distances Assistive device utilized: Walker - 2 wheeled Level of assistance:  Modified independence Comments: shuffling feet, decreased stance time bilaterally, decrease stride length, poor foot clearance   TODAY'S TREATMENT:                                                                                                                              DATE:  03/19/22 NuStep L5 x 6 min Resisted gait 30lb 4 way x 4 each Shoulder Ext for core 10lb 2x10  On airex alt 6in box taps x10 S2S on airex 2x10 Alternate taps on cone on the floor x 10, mildly unsteady.  Side steps and tandem waling on balance beam in // bars Leg press 40# 3x12 HS self stretch   03/14/22 NuStep L5 x 4  minutes B side stepping onto and off Air Ex pad x 10 each direction. No UE support, S for balance. Step back with RLE, rotating to R to place object on mat behind, then return forward. % x to each side, no difficulty with balance. Alternate taps on cone on the floor x 10, mildly unsteady.  Progressed to multiple taps with each foot, mod difficulty occasional light min A for balance. Floor ladder- walking with one foot in each square x 4, side stepping into each square x 2, changing direction upon therapist command x 1 Walking on floor mat, forward, back, side stepping, turning, slow walking, CGA, mildly unsteady, but no LOB.   03/11/22 NuStep L5 x 6 minutes. Trunk mobilization-seated, rotate to L, walk hands out and back x 4, repeat to R Seated HS stretch, 3 x 15 sec each leg Supine stretch with strap, Abd, Add, figure 4, ITB, LTR, 3 x 15 sec each. Balance/mobility training in the parallel bars-crossover and back with trunk rotation, minimize UE support, but required at least UUE. Quick steps side to side x 30 seconds Ambulation with quick changes of direction, forward, back, side to side, increased speed, approximately 200'   03/07/22 Bike L3 x55mns  2 way hip 3# 2x10 Marching on airex in // bars  Side steps on beam in // bars Narrow walking on beam in // bars Shoulder ext 10# 2x10 STS with OHP yellow 2x10 Leg press 40# 2x10 Calf raises 2x10   03/05/22 Spoke with him and answered their questions STS with weighted yellow ball 2x5 Cone toe touches with light HHA Nustep Level 5 x 6 minutes Seated pball roll outs for back stretches Side step over objects, side step on and off airex In pbars, marching, airex balance beam side stepping and tandem walking Fast gait 250 feet Ball toss, volleyball Direction changes   03/01/22 NuStep L5 x645ms Feet on pball trunk rotations, knees to chest x10 Bridges 2x10  STS on airex 2x10 Box taps 6" Side steps over obstacles Step ups on  airex- minA   02/26/22 Bike L2 x 5 minutes Supine DKTC over physioball x 5, hold 5 sec Supine bridge over physioball, repeat with B hip IR, 10 each Seated clamshells and active  add for lateral hip strengthening and stretch x 10, G tband. Sit to stqnd on Air ex pad, holding 2# ball in BUE with OHP at top. Min TC and VC to lean forward sufficiently to balance. 10 reps. B side to side stepping against G Tband resistance, B HHA, 2 x 10 reps each direction. B side stepping over dowels on the floor. Heel raises on black bar, 20 reps, BUE support for balance. Ambulation, walking x approximately 200', including multiple turns, avoiding obstacles, backwards stepping. No AD, no unsteadiness noted.  02/14/22- EVAL    PATIENT EDUCATION:  Education details: POC and HEP Person educated: Patient and Spouse Education method: Explanation Education comprehension: verbalized understanding  HOME EXERCISE PROGRAM:  Access Code: [email or web address] URL: [email or web address] Date: 02/14/2022 Prepared by: Andris Baumann  Exercises - Sit to Stand with Arms Crossed  - 1 x daily - 7 x weekly - 2 sets - 10 reps - Standing March with Counter Support  - 1 x daily - 7 x weekly - 2 sets - 10 reps - Standing Hip Abduction with Unilateral Counter Support  - 1 x daily - 7 x weekly - 2 sets - 10 reps - Standing with Head Rotation  - 1 x daily - 7 x weekly - 2 sets - 5 reps - 20 hold - Romberg Stance with Head Nods  - 1 x daily - 7 x weekly - 2 sets - 5 reps - 20 hold  ASSESSMENT:  CLINICAL IMPRESSION: Patient reports no new issues. Continued to focused on balance today with on unstable surfaces,  He tolerated all with mild unsteadiness requirng CGA at times. Cue for LE placement needed with S2S and while on Leg press. Pt has very tight HS.   OBJECTIVE IMPAIRMENTS: Abnormal gait, decreased balance, decreased mobility, difficulty walking, decreased strength, and pain.   ACTIVITY LIMITATIONS: bending, stairs,  transfers, bed mobility, and locomotion level  PARTICIPATION LIMITATIONS: driving, shopping, and yard work  [employer] POTENTIAL: Good  CLINICAL DECISION MAKING: Stable/uncomplicated  EVALUATION COMPLEXITY: Low   GOALS: Goals reviewed with patient? Yes  SHORT TERM GOALS: Target date: 03/28/22  Patient will be independent with initial HEP. Goal status: met  2.  Patient will demonstrate decreased fall risk by scoring < 15 sec on TUG. Baseline: 22.27s Goal status: ongoing  3.  Patient will be educated on strategies to decrease risk of falls.  Goal status: INITIAL   LONG TERM GOALS: Target date: 05/09/22  Patient will be independent with advanced/ongoing HEP to improve outcomes and carryover.  Goal status: INITIAL  2.  Patient will be able to ambulate 600' with LRAD with good safety to access community.  Baseline: using RW after fall Goal status: 03/14/22 ongoing  3.  Patient will demonstrate improved functional LE strength as demonstrated by decreased 5xSTS to <14s. Baseline: 21.21s Goal status: 03/14/22 ongoing  4.  Patient will score 46 on Berg Balance test to demonstrate lower risk of falls. (MCID= 8 points) .  Baseline: 38 Goal status: INITIAL   PLAN:  PT FREQUENCY: 1-2x/week  PT DURATION: 12 weeks  PLANNED INTERVENTIONS: Therapeutic exercises, Therapeutic activity, Neuromuscular re-education, Balance training, Gait training, Patient/Family education, Self Care, Joint mobilization, Stair training, Dry Needling, Electrical stimulation, Cryotherapy, Moist heat, Vasopneumatic device, Ionotophoresis '4mg'$ /ml Dexamethasone, and Manual therapy  PLAN FOR NEXT SESSION: start gym activities, work on strength and balance  Ethel Rana DPT 03/19/22 11:40 AM

## 2022-03-19 NOTE — Therapy (Signed)
OUTPATIENT OCCUPATIONAL THERAPY NEURO Treatment  Patient Name: Raymond Christensen MRN: [id number] DOB:Sep 04, 1940, 82 y.o., male Today's Date: 03/19/2022  PCP:Dr. Nani Ravens REFERRING PROVIDER: Dr. Nani Ravens  END OF SESSION:  OT End of Session - 03/19/22 1324     Visit Number 3    Number of Visits 17    Date for OT Re-Evaluation 05/06/22    Authorization Type generic Medicare    Authorization - Visit Number 3    Progress Note Due on Visit 10    OT Start Time 1234    OT Stop Time 1316    OT Time Calculation (min) 42 min    Activity Tolerance Patient tolerated treatment well    Behavior During Therapy [facility] for tasks assessed/performed               Past Medical History:  Diagnosis Date   Arthritis    CAD (coronary artery disease)    Carotid artery disease ([place])    s/p left CEA [id number]   Chronic systolic CHF (congestive heart failure) ([place])    Concussion Summer 2012   Essential hypertension 02/26/2017   Heart disease    Hyperlipidemia    Hypothyroidism 10/15/2017   Major depressive disorder    Mitral valve regurgitation    Myocardial infarction ([place]) 1996, 2021   Obstructive sleep apnea on CPAP    Persistent atrial fibrillation (HCC)    Poor flexibility of tendon 12/01/2018   S/P mitral valve clip implantation 12/23/2019   s/p TEER with MitraClip with one XTW by Dr. Burt Knack   Subungual hematoma of digit of hand 12/01/2018   Subungual hematoma of right foot 12/01/2018   Thyroid disease    Type 2 diabetes mellitus without complication, without long-term current use of insulin ([place]) 02/26/2017   Past Surgical History:  Procedure Laterality Date   ATRIAL FIBRILLATION ABLATION N/A 02/01/2020   Procedure: ATRIAL FIBRILLATION ABLATION;  Surgeon: [name], MD;  Location: [place] [facility];  Service: Cardiovascular;  Laterality: N/A;   BYPASS GRAFT  2001   CARDIAC CATHETERIZATION     CARDIOVERSION N/A 11/08/2019   Procedure: CARDIOVERSION;  Surgeon: [name],  MD;  Location: [place];  Service: Cardiovascular;  Laterality: N/A;   CAROTID ENDARTERECTOMY Left 01/17/2014   CORONARY ANGIOPLASTY     CORONARY ARTERY BYPASS GRAFT     MITRAL VALVE REPAIR N/A 12/23/2019   Procedure: MITRAL VALVE REPAIR;  Surgeon: [name], MD;  Location: [place] [facility];  Service: Cardiovascular;  Laterality: N/A;   RIGHT/LEFT HEART CATH AND CORONARY/GRAFT ANGIOGRAPHY N/A 09/09/2019   Procedure: RIGHT/LEFT HEART CATH AND CORONARY/GRAFT ANGIOGRAPHY;  Surgeon: [name], MD;  Location: [place] [facility];  Service: Cardiovascular;  Laterality: N/A;   TEE WITHOUT CARDIOVERSION N/A 09/13/2019   Procedure: TRANSESOPHAGEAL ECHOCARDIOGRAM (TEE);  Surgeon: [name], MD;  Location: [facility] ENDOS[facility];  Service: Cardiovascular;  Laterality: N/A;   TEE WITHOUT CARDIOVERSION N/A 12/23/2019   Procedure: TRANSESOPHAGEAL ECHOCARDIOGRAM (TEE);  Surgeon: [name], MD;  Location: [place] [facility];  Service: Cardiovascular;  Laterality: N/A;   TEE WITHOUT CARDIOVERSION  02/01/2020   Procedure: TRANSESOPHAGEAL ECHOCARDIOGRAM (TEE);  Surgeon: [name], MD;  Location: [place] [facility];  Service: Cardiovascular;;   TOTAL HIP ARTHROPLASTY  1994, 1995   Patient Active Problem List   Diagnosis Date Noted   Lipoma of right upper extremity 03/02/2021   Trigger little finger of left hand 09/29/2020   Secondary hypercoagulable state ([place]) 02/29/2020   Depression, major,  single episode, moderate ([place]) 02/28/2020   Nonrheumatic tricuspid valve regurgitation    S/P mitral valve clip implantation 12/23/2019   Mitral valve disease 12/23/2019   Nonrheumatic mitral valve regurgitation    Persistent atrial fibrillation (HCC)    Chronic systolic CHF (congestive heart failure) (HCC)    Acute idiopathic gout involving toe of left foot    Thrombocytopenia ([place]) 09/08/2019   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  ([place])    Diabetes mellitus type 2 in obese ([place]) 06/22/2019   Major depressive disorder    Obstructive sleep apnea on CPAP    Hypothyroidism 10/15/2017   Subclavian artery stenosis, right ([place]) 06/15/2017   Type 2 diabetes mellitus without complication, without long-term current use of insulin ([place]) 02/26/2017   Severe obesity (BMI 35.0-39.9) with comorbidity ([place]) 02/26/2017   Essential hypertension 02/26/2017   Memory change 01/10/2017   Urge incontinence of urine 01/10/2017   Lightheaded 06/25/2016   AK (actinic keratosis) 01/03/2016   H/O syncope 03/06/2014   Occlusion and stenosis of left carotid artery 01/11/2014   Routine history and physical examination of adult 11/08/2013   History of repair of hip joint 04/20/2013   Hyperlipidemia 04/20/2013   Presence of artificial hip joint 04/20/2013   Vitamin D deficiency 06/18/2010   Concussion 01/24/2010   Elevated prostate specific antigen (PSA) 01/01/2007   Benign prostatic hyperplasia without urinary obstruction 04/22/2006   Osteoarthrosis 04/22/2006   Dyslipidemia 04/02/2006    ONSET DATE: 02/01/22  REFERRING DIAG:  R29.6 (ICD-10-CM) - Recurrent falls  S06.0X0A (ICD-10-CM) - Concussion without loss of consciousness, initial encounter    THERAPY DIAG:  Muscle weakness (generalized)  Other lack of coordination  Other abnormalities of gait and mobility  Attention and concentration deficit  Frontal lobe and executive function deficit  Rationale for Evaluation and Treatment: Rehabilitation  SUBJECTIVE:   SUBJECTIVE STATEMENT: Pt denies pain Pt accompanied by: self  PERTINENT HISTORY: Past medical history significant for coronary artery disease, type 2 diabetes mellitus, previous MI, chronic systolic CHF, persistent A-fib  Had a fall on 02/01/22- tripped in a grocery store   DIAGNOSTIC FINDINGS:  CT cervical spine:   No evidence of acute fracture or traumatic malalignment.   CT head:   1. No evidence of  acute intracranial abnormality. 2. Stable chronic hypodense bilateral subdural collections. 3. Right forehead contusion.     PAIN:   PRECAUTIONS: Fall  WEIGHT BEARING RESTRICTIONS: No  PAIN:  Are you having pain? No  FALLS: Has patient fallen in last 6 months? Yes. Number of falls 2  LIVING ENVIRONMENT: Lives with: lives with their spouse Lives in: House/apartment Stairs: No Has following equipment at home:  built in shower bench  PLOF: Needs assistance with ADLs  PATIENT GOALS: increased ease with LB dressing  OBJECTIVE:   HAND DOMINANCE: Right  ADLs: Overall ADLs: needs assist with LB Transfers/ambulation related to ADLs:falls in grocery store Eating: mod I Grooming: mod I UB Dressing: mod I LB Dressing: needs help with socks Toileting: mod I Bathing: mod I Tub Shower transfers: mod I   IADLs: Shopping: needs assist, fell in grocery store x 2 Light housekeeping: pt's wife is doing more currently Meal Prep: cooks meals mod I Community mobility: falls in walking in grocery Medication management: keeps up with own meds Financial management: pt reports he handles finances Handwriting: 90% legible  MOBILITY STATUS: Hx of falls      UPPER EXTREMITY ROM:  WFLS  UPPER EXTREMITY MMT:     MMT Right eval Left eval  Shoulder flexion 4/5 4+/5  Shoulder abduction    Shoulder adduction    Shoulder extension    Shoulder internal rotation    Shoulder external rotation    Middle trapezius    Lower trapezius    Elbow flexion 4+/5 4+/5  Elbow extension 4+/5 4+/5  Wrist flexion    Wrist extension    Wrist ulnar deviation    Wrist radial deviation    Wrist pronation    Wrist supination    (Blank rows = not tested)  HAND FUNCTION: Grip strength: Right: 53 lbs; Left: 73 lbs  COORDINATION: 9 Hole Peg test: Right: 41.74 sec; Left: 33.53 sec Action tremor present SENSATION: WFL    COGNITION: Overall cognitive status: Impaired recall 2/3 items  following short delay, may have delayed processing , cognition to be further addressed within a functional context   VISION: Subjective report: denies visual changes  Baseline vision: Wears glasses all the time   VISION ASSESSMENT: Not tested     OBSERVATIONS: action tremor bilateral UE's   TODAY'S TREATMENT:                                                                                                                              DATE: 03/19/22- Pt practiced doffing then donning socks using reach, sock aide, min difficulty and increased time and min v.c Pt was provided with information for purchase.   Purdue pegboard for increased RUE fine motor coordination and in hand  manipulation, min difficulty/ v.c  Green theraband exercises 10-15 reps each, min v.c for proximal strengthening.  Memory compensation strategies, reviewed and handout issued    03/14/22 - Pt was instructed in HEP for fine motor coordination and green putty for grip strength. Pt returned demonstration with min v.c . Pt was cautioned to toss ball over a table and not to chase after the ball if he drops it due to fall risk. Handwriting activity practicing with and without  using foam grip on pen and cues to support elbow on tabletop. Pt with improved handwriting using foam grip and cues to slow down and deliberately form each letter.  Gripper set at level 4 block spring for sustained grip to pick up 1 inch blocks, 2-3 rest breaks required due to fatigue. Arm bike x 5 min s level 3 for conditioning.  PATIENT EDUCATION:Education details:green theraband  HEP, memory compensations, use of sock aid and where  to purchase Person educated: Patient Education method: Explanation, Demonstration, Verbal cues, and Handouts Education comprehension: verbalized understanding, returned demonstration, and verbal cues required  HOME EXERCISE PROGRAM: N/A   GOALS: Goals reviewed with patient? Yes  SHORT TERM GOALS: Target date:  04/08/22  I with HEP for coordination and grip strength Baseline: Goal status: ongoing- HEP issued 03/14/22  2.  Pt will verbalize understanding of compensatory strategies for short term memory deficits. Baseline: recalls 2/3 items  following short delay Goal status: ongoing  3.  Pt will increase RUE grip strength by 5 lbs for increased functional use Baseline: Grip strength: Right: 53 lbs; Left: 73 lbs Goal status:ongoing- HEP issued 03/14/22   LONG TERM GOALS: Target date: 05/06/22  I with HEP for proximal strength Baseline:  Goal status: INITIAL  2.  Pt will demonstrate improved fine motor coordination for ADLS as evidenced by decreasing 9 hole peg test by 3 secs for RUE. Baseline: 9 Hole Peg test: Right: 41.74 sec; Left: 33.53 sec Goal status: INITIAL  3.  Pt will donn socks mod I. Baseline: needs assist Goal status: INITIAL  4.  Pt will resume prior level of home management activities demonstrating good safety awareness. Baseline: pt's wife is currently performing. Goal status: INITIAL    ASSESSMENT:  CLINICAL IMPRESSION: Pt is progressing towards goals. He demonstrates understanding of green theraband HEP for proximal  strength. PERFORMANCE DEFICITS: in functional skills including ADLs, IADLs, coordination, dexterity, tone, ROM, strength, pain, flexibility, Fine motor control, Gross motor control, mobility, balance, endurance, decreased knowledge of precautions, decreased knowledge of use of DME, vision, and UE functional use, cognitive skills including attention, memory, problem solving, and thought, and psychosocial skills including coping strategies, environmental adaptation, habits, interpersonal interactions, and routines and behaviors.   IMPAIRMENTS: are limiting patient from ADLs, IADLs, play, leisure, and social participation.   CO-MORBIDITIES: may have co-morbidities  that affects occupational performance. Patient will benefit from skilled OT to address above  impairments and improve overall function.  MODIFICATION OR ASSISTANCE TO COMPLETE EVALUATION: No modification of tasks or assist necessary to complete an evaluation.  OT OCCUPATIONAL PROFILE AND HISTORY: Problem focused assessment: Including review of records relating to presenting problem.  CLINICAL DECISION MAKING: LOW - limited treatment options, no task modification necessary  REHAB POTENTIAL: Good  EVALUATION COMPLEXITY: Low    PLAN:  OT FREQUENCY:2x/ week - resent to update frequency  OT DURATION: 8 weeks plus eval  PLANNED INTERVENTIONS: self care/ADL training, therapeutic exercise, therapeutic activity, neuromuscular re-education, manual therapy, passive range of motion, balance training, functional mobility training, moist heat, patient/family education, cognitive remediation/compensation, energy conservation, coping strategies training, and DME and/or AE instructions  RECOMMENDED OTHER SERVICES: PT  CONSULTED AND AGREED WITH PLAN OF CARE: Patient  PLAN FOR NEXT SESSION: UE strength and endurance, Ainsley Deakins, OT 03/19/2022, 1:24 PM

## 2022-03-19 NOTE — Patient Instructions (Addendum)
         Memory Compensation Strategies  Use "WARM" strategy.  W= write it down  A= associate it  R= repeat it  M= make a mental note  2.   You can keep  Notebook.  Use a notebook with sections for the following: calendar, important names and phone numbers, medications, doctors' names/phone numbers, lists/reminders, and a section to journal what you did each day.   3.    Use a calendar to write appointments down.  4.    Write yourself a schedule for the day.  This can be placed on the calendar or in a separate section of the Memory Notebook.  Keeping a regular schedule can help memory.  5.    Use medication organizer with sections for each day or morning/evening pills.  You may need help loading it  6.    Keep a basket, or pegboard by the door.  Place items that you need to take out with you in the basket or on the pegboard.  You may also want to  include a message board for reminders.  7.    Use sticky notes.  Place sticky notes with reminders in a place where the task is performed.  For example: " turn off the  stove" placed by the stove, "lock the door" placed on the door at eye level, " take your medications" on the bathroom mirror or by the place where you normally take your medications.  8.    Use alarms/timers.  Use while cooking to remind yourself to check on food or as a reminder to take your medicine, or as a reminder to make a call, or as a reminder to perform another task, etc.                                    Light green band Strengthening: Resisted Flexion   Hold tubing with __one___ arm(s) at side. Pull forward and up. Move shoulder through pain-free range of motion. Repeat __10__ times per set.  Do _1-2_ sessions per day , every other day   Strengthening: Resisted Extension   Hold tubing in ___one__ hand(s), arm forward. Pull arm back, elbow straight. Repeat _10___ times per set. Do _1-2___ sessions per day, every  other day.   Resisted Horizontal Abduction: Bilateral   Sit or stand, tubing in both hands, arms out in front. Keeping arms straight, pinch shoulder blades together and stretch arms out. Repeat _10___ times per set. Do _1-2___ sessions per day, every other day.   Elbow Flexion: Resisted   With tubing held in __one____ hand(s) and other end secured under foot, curl arm up as far as possible. Repeat _10___ times per set. Do _1-2___ sessions per day, every other day.    Elbow Extension: Resisted   Sit in chair with resistive band secured at armrest (or hold with other hand) and one__ elbow bent. Straighten elbow. Repeat _10___ times per set.  Do _1-2___ sessions per day, every other day.   Copyright  [employer]. All rights reserved.

## 2022-03-20 ENCOUNTER — Telehealth: Payer: Self-pay

## 2022-03-20 ENCOUNTER — Encounter: Payer: Self-pay | Admitting: Family Medicine

## 2022-03-20 NOTE — Telephone Encounter (Signed)
PA initiated via Covermymeds; KEY: [id number]. Awaiting determination.

## 2022-03-21 ENCOUNTER — Ambulatory Visit: Payer: Medicare (Managed Care) | Admitting: Physical Therapy

## 2022-03-21 ENCOUNTER — Ambulatory Visit: Payer: Medicare (Managed Care) | Admitting: Occupational Therapy

## 2022-03-21 ENCOUNTER — Encounter: Payer: Self-pay | Admitting: Physical Therapy

## 2022-03-21 DIAGNOSIS — M6281 Muscle weakness (generalized): Secondary | ICD-10-CM

## 2022-03-21 DIAGNOSIS — R278 Other lack of coordination: Secondary | ICD-10-CM

## 2022-03-21 DIAGNOSIS — R262 Difficulty in walking, not elsewhere classified: Secondary | ICD-10-CM

## 2022-03-21 DIAGNOSIS — R296 Repeated falls: Secondary | ICD-10-CM

## 2022-03-21 DIAGNOSIS — Z9181 History of falling: Secondary | ICD-10-CM

## 2022-03-21 NOTE — Therapy (Signed)
OUTPATIENT PHYSICAL THERAPY LOWER EXTREMITY TREATMENT   Patient Name: Raymond Christensen MRN: [id number] DOB:Mar 29, 1940, 82 y.o., male Today's Date: 03/21/2022  END OF SESSION:  PT End of Session - 03/21/22 1142     Visit Number 9    Date for PT Re-Evaluation 05/09/22    Authorization Type Medicare    PT Start Time 1142    PT Stop Time 1228    PT Time Calculation (min) 46 min    Activity Tolerance Patient tolerated treatment well    Behavior During Therapy [facility] for tasks assessed/performed               Past Medical History:  Diagnosis Date   Arthritis    CAD (coronary artery disease)    Carotid artery disease ([place])    s/p left CEA [id number]   Chronic systolic CHF (congestive heart failure) ([place])    Concussion Summer 2012   Essential hypertension 02/26/2017   Heart disease    Hyperlipidemia    Hypothyroidism 10/15/2017   Major depressive disorder    Mitral valve regurgitation    Myocardial infarction ([place]) 1996, 2021   Obstructive sleep apnea on CPAP    Persistent atrial fibrillation (HCC)    Poor flexibility of tendon 12/01/2018   S/P mitral valve clip implantation 12/23/2019   s/p TEER with MitraClip with one XTW by Dr. Burt Knack   Subungual hematoma of digit of hand 12/01/2018   Subungual hematoma of right foot 12/01/2018   Thyroid disease    Type 2 diabetes mellitus without complication, without long-term current use of insulin ([place]) 02/26/2017   Past Surgical History:  Procedure Laterality Date   ATRIAL FIBRILLATION ABLATION N/A 02/01/2020   Procedure: ATRIAL FIBRILLATION ABLATION;  Surgeon: [name], MD;  Location: [facility];  Service: Cardiovascular;  Laterality: N/A;   BYPASS GRAFT  2001   CARDIAC CATHETERIZATION     CARDIOVERSION N/A 11/08/2019   Procedure: CARDIOVERSION;  Surgeon: [name], MD;  Location: [facility];  Service: Cardiovascular;  Laterality: N/A;   CAROTID ENDARTERECTOMY Left 01/17/2014   CORONARY ANGIOPLASTY      CORONARY ARTERY BYPASS GRAFT     MITRAL VALVE REPAIR N/A 12/23/2019   Procedure: MITRAL VALVE REPAIR;  Surgeon: [name], MD;  Location: [facility];  Service: Cardiovascular;  Laterality: N/A;   RIGHT/LEFT HEART CATH AND CORONARY/GRAFT ANGIOGRAPHY N/A 09/09/2019   Procedure: RIGHT/LEFT HEART CATH AND CORONARY/GRAFT ANGIOGRAPHY;  Surgeon: [name], MD;  Location: [facility];  Service: Cardiovascular;  Laterality: N/A;   TEE WITHOUT CARDIOVERSION N/A 09/13/2019   Procedure: TRANSESOPHAGEAL ECHOCARDIOGRAM (TEE);  Surgeon: [name], MD;  Location: [facility];  Service: Cardiovascular;  Laterality: N/A;   TEE WITHOUT CARDIOVERSION N/A 12/23/2019   Procedure: TRANSESOPHAGEAL ECHOCARDIOGRAM (TEE);  Surgeon: [name], MD;  Location: [facility];  Service: Cardiovascular;  Laterality: N/A;   TEE WITHOUT CARDIOVERSION  02/01/2020   Procedure: TRANSESOPHAGEAL ECHOCARDIOGRAM (TEE);  Surgeon: [name], MD;  Location: [facility];  Service: Cardiovascular;;   TOTAL HIP ARTHROPLASTY  1994, 1995   Patient Active Problem List   Diagnosis Date Noted   Lipoma of right upper extremity 03/02/2021   Trigger little finger of left hand 09/29/2020   Secondary hypercoagulable state ([place]) 02/29/2020   Depression, major, single episode, moderate (HCC) 02/28/2020   Nonrheumatic tricuspid valve regurgitation    S/P mitral valve clip implantation 12/23/2019   Mitral valve disease 12/23/2019   Nonrheumatic mitral  valve regurgitation    Persistent atrial fibrillation (HCC)    Chronic systolic CHF (congestive heart failure) (HCC)    Acute idiopathic gout involving toe of left foot    Thrombocytopenia ([place]) 09/08/2019   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 ([place])    Diabetes mellitus type 2 in obese ([place]) 06/22/2019   Major depressive disorder    Obstructive sleep apnea on CPAP    Hypothyroidism  10/15/2017   Subclavian artery stenosis, right ([place]) 06/15/2017   Type 2 diabetes mellitus without complication, without long-term current use of insulin ([place]) 02/26/2017   Severe obesity (BMI 35.0-39.9) with comorbidity ([place]) 02/26/2017   Essential hypertension 02/26/2017   Memory change 01/10/2017   Urge incontinence of urine 01/10/2017   Lightheaded 06/25/2016   AK (actinic keratosis) 01/03/2016   H/O syncope 03/06/2014   Occlusion and stenosis of left carotid artery 01/11/2014   Routine history and physical examination of adult 11/08/2013   History of repair of hip joint 04/20/2013   Hyperlipidemia 04/20/2013   Presence of artificial hip joint 04/20/2013   Vitamin D deficiency 06/18/2010   Concussion 01/24/2010   Elevated prostate specific antigen (PSA) 01/01/2007   Benign prostatic hyperplasia without urinary obstruction 04/22/2006   Osteoarthrosis 04/22/2006   Dyslipidemia 04/02/2006    PCP: Riki Sheer  REFERRING PROVIDER: Riki Sheer  REFERRING DIAG: M53.3- Sacroiliac dysfunction  THERAPY DIAG:  Muscle weakness (generalized)  Other lack of coordination  Difficulty in walking, not elsewhere classified  Repeated falls  History of falling  Rationale for Evaluation and Treatment: Rehabilitation  ONSET DATE: 12/26/21  SUBJECTIVE:   SUBJECTIVE STATEMENT: "Pretty good"  PERTINENT HISTORY: Past medical history significant for coronary artery disease, type 2 diabetes mellitus, previous MI, chronic systolic CHF, persistent A-fib  Had a fall on 02/01/22- tripped in a grocery store  PAIN:  Are you having pain? Yes: NPRS scale: 0/10 Pain location: head, shoulder, hip Pain description: sharp if I press on it other than that it is just dull Aggravating factors: nothing specifically Relieving factors: medicine  PRECAUTIONS: None  WEIGHT BEARING RESTRICTIONS: No  FALLS:  Has patient fallen in last 6 months? Yes. Number of falls 2  LIVING  ENVIRONMENT: Lives with: lives with their spouse Lives in: House/apartment Stairs: No Has following equipment at home: Environmental consultant - 2 wheeled  OCCUPATION: Retired  PLOF: Independent  PATIENT GOALS: I want to make sure I can become ambulatory again and get around without the walker  NEXT MD VISIT:   OBJECTIVE:   DIAGNOSTIC FINDINGS:  CT cervical spine:   No evidence of acute fracture or traumatic malalignment.  CT head:   1. No evidence of acute intracranial abnormality. 2. Stable chronic hypodense bilateral subdural collections. 3. Right forehead contusion.  IMPRESSION: Negative.  COGNITION: Overall cognitive status: Within functional limits for tasks assessed, some delay in response and wife reports some memory deficits    SENSATION: WFL   MUSCLE LENGTH: Hamstrings:bilateral tightness, more in LLE  POSTURE: rounded shoulders, forward head, and flexed trunk   PALPATION: Tightness in bilateral UT, more in R side LOWER EXTREMITY ROM: grossly WFL some pain with R side hip flexion    LOWER EXTREMITY MMT: grossly 4/5    FUNCTIONAL TESTS:  5 times sit to stand: 21.31s with UE use Timed up and go (TUG): 22.27s  GAIT: Distance walked: in clinic distances Assistive device utilized: Walker - 2 wheeled Level of assistance: Modified independence Comments: shuffling feet, decreased stance time  bilaterally, decrease stride length, poor foot clearance   TODAY'S TREATMENT:                                                                                                                              DATE:  03/21/22 NuStep L5 x 6 min Resisted side step over objets 20lb x5 each On airex ball toss  S2S OHP on airex 2x10 6in step ups from airex 2x5 each Side step on an off airex  Calf stretch with black bar  35lb hamstring curls 2x10 Leg Ext 10lb 2x10 Passive Hs and piriformis stretch.    03/19/22 NuStep L5 x 6 min Resisted gait 30lb 4 way x 4 each Shoulder Ext for core  10lb 2x10  On airex alt 6in box taps x10 S2S on airex 2x10 Alternate taps on cone on the floor x 10, mildly unsteady.  Side steps and tandem waling on balance beam in // bars Leg press 40# 3x12 HS self stretch   03/14/22 NuStep L5 x 4 minutes B side stepping onto and off Air Ex pad x 10 each direction. No UE support, S for balance. Step back with RLE, rotating to R to place object on mat behind, then return forward. % x to each side, no difficulty with balance. Alternate taps on cone on the floor x 10, mildly unsteady.  Progressed to multiple taps with each foot, mod difficulty occasional light min A for balance. Floor ladder- walking with one foot in each square x 4, side stepping into each square x 2, changing direction upon therapist command x 1 Walking on floor mat, forward, back, side stepping, turning, slow walking, CGA, mildly unsteady, but no LOB.   03/11/22 NuStep L5 x 6 minutes. Trunk mobilization-seated, rotate to L, walk hands out and back x 4, repeat to R Seated HS stretch, 3 x 15 sec each leg Supine stretch with strap, Abd, Add, figure 4, ITB, LTR, 3 x 15 sec each. Balance/mobility training in the parallel bars-crossover and back with trunk rotation, minimize UE support, but required at least UUE. Quick steps side to side x 30 seconds Ambulation with quick changes of direction, forward, back, side to side, increased speed, approximately 200'   03/07/22 Bike L3 x84mns  2 way hip 3# 2x10 Marching on airex in // bars  Side steps on beam in // bars Narrow walking on beam in // bars Shoulder ext 10# 2x10 STS with OHP yellow 2x10 Leg press 40# 2x10 Calf raises 2x10   03/05/22 Spoke with him and answered their questions STS with weighted yellow ball 2x5 Cone toe touches with light HHA Nustep Level 5 x 6 minutes Seated pball roll outs for back stretches Side step over objects, side step on and off airex In pbars, marching, airex balance beam side stepping and tandem  walking Fast gait 250 feet Ball toss, volleyball Direction changes   03/01/22 NuStep L5 x656ms Feet on pball trunk rotations, knees to chest  x10 Bridges 2x10  STS on airex 2x10 Box taps 6" Side steps over obstacles Step ups on airex- minA   02/26/22 Bike L2 x 5 minutes Supine DKTC over physioball x 5, hold 5 sec Supine bridge over physioball, repeat with B hip IR, 10 each Seated clamshells and active add for lateral hip strengthening and stretch x 10, G tband. Sit to stqnd on Air ex pad, holding 2# ball in BUE with OHP at top. Min TC and VC to lean forward sufficiently to balance. 10 reps. B side to side stepping against G Tband resistance, B HHA, 2 x 10 reps each direction. B side stepping over dowels on the floor. Heel raises on black bar, 20 reps, BUE support for balance. Ambulation, walking x approximately 200', including multiple turns, avoiding obstacles, backwards stepping. No AD, no unsteadiness noted.  02/14/22- EVAL    PATIENT EDUCATION:  Education details: POC and HEP Person educated: Patient and Spouse Education method: Explanation Education comprehension: verbalized understanding  HOME EXERCISE PROGRAM:  Access Code: [email or web address] URL: [email or web address] Date: 02/14/2022 Prepared by: Andris Baumann  Exercises - Sit to Stand with Arms Crossed  - 1 x daily - 7 x weekly - 2 sets - 10 reps - Standing March with Counter Support  - 1 x daily - 7 x weekly - 2 sets - 10 reps - Standing Hip Abduction with Unilateral Counter Support  - 1 x daily - 7 x weekly - 2 sets - 10 reps - Standing with Head Rotation  - 1 x daily - 7 x weekly - 2 sets - 5 reps - 20 hold - Romberg Stance with Head Nods  - 1 x daily - 7 x weekly - 2 sets - 5 reps - 20 hold  ASSESSMENT:  CLINICAL IMPRESSION: Patient reports no new issues. Continued to focused on balance today with use of unstable surfaces,  He tolerated all with mild unsteadiness requirng CGA at times. Cue for LE  placement needed with S2S. Cue needed to take bigger step with resisted side stp and side step on and off airex. Pt has very tight HS.   OBJECTIVE IMPAIRMENTS: Abnormal gait, decreased balance, decreased mobility, difficulty walking, decreased strength, and pain.   ACTIVITY LIMITATIONS: bending, stairs, transfers, bed mobility, and locomotion level  PARTICIPATION LIMITATIONS: driving, shopping, and yard work  [employer] POTENTIAL: Good  CLINICAL DECISION MAKING: Stable/uncomplicated  EVALUATION COMPLEXITY: Low   GOALS: Goals reviewed with patient? Yes  SHORT TERM GOALS: Target date: 03/28/22  Patient will be independent with initial HEP. Goal status: met  2.  Patient will demonstrate decreased fall risk by scoring < 15 sec on TUG. Baseline: 22.27s Goal status: ongoing  3.  Patient will be educated on strategies to decrease risk of falls.  Goal status: INITIAL   LONG TERM GOALS: Target date: 05/09/22  Patient will be independent with advanced/ongoing HEP to improve outcomes and carryover.  Goal status: INITIAL  2.  Patient will be able to ambulate 600' with LRAD with good safety to access community.  Baseline: using RW after fall Goal status: 03/14/22 ongoing  3.  Patient will demonstrate improved functional LE strength as demonstrated by decreased 5xSTS to <14s. Baseline: 21.21s Goal status: 03/14/22 ongoing  4.  Patient will score 46 on Berg Balance test to demonstrate lower risk of falls. (MCID= 8 points) .  Baseline: 38 Goal status: INITIAL   PLAN:  PT FREQUENCY: 1-2x/week  PT DURATION: 12 weeks  PLANNED INTERVENTIONS: Therapeutic exercises, Therapeutic activity, Neuromuscular  re-education, Balance training, Gait training, Patient/Family education, Self Care, Joint mobilization, Stair training, Dry Needling, Electrical stimulation, Cryotherapy, Moist heat, Vasopneumatic device, Ionotophoresis '4mg'$ /ml Dexamethasone, and Manual therapy  PLAN FOR NEXT SESSION: start  gym activities, work on strength and balance  Cheri Fowler PTA 03/21/22 11:43 AM

## 2022-03-22 ENCOUNTER — Other Ambulatory Visit (INDEPENDENT_AMBULATORY_CARE_PROVIDER_SITE_OTHER): Payer: Medicare (Managed Care)

## 2022-03-22 ENCOUNTER — Other Ambulatory Visit: Payer: Medicare (Managed Care)

## 2022-03-22 DIAGNOSIS — I10 Essential (primary) hypertension: Secondary | ICD-10-CM

## 2022-03-22 LAB — BASIC METABOLIC PANEL
BUN: 15 mg/dL (ref 6–23)
CO2: 30 mEq/L (ref 19–32)
Calcium: 9.7 mg/dL (ref 8.4–10.5)
Chloride: 105 mEq/L (ref 96–112)
Creatinine, Ser: 0.96 mg/dL (ref 0.40–1.50)
GFR: 74.67 mL/min (ref >60.00)
Glucose, Bld: 117 mg/dL — ABNORMAL HIGH (ref 70–99)
Potassium: 4.3 mEq/L (ref 3.5–5.1)
Sodium: 142 mEq/L (ref 135–145)

## 2022-03-22 NOTE — Telephone Encounter (Signed)
PA approved.   Your PA request has been approved. Additional information will be provided in the approval communication. Ozempic therapy through 01/14/2023 as your Medicare Part D pharmacy benefit allows.

## 2022-03-22 NOTE — Addendum Note (Signed)
Addended by: Sharon Seller B on: 03/22/2022 09:59 AM   Modules accepted: Orders

## 2022-03-22 NOTE — Addendum Note (Signed)
Addended by: Sharon Seller B on: 03/22/2022 10:01 AM   Modules accepted: Orders

## 2022-03-25 NOTE — Therapy (Signed)
OUTPATIENT PHYSICAL THERAPY LOWER EXTREMITY TREATMENT Progress Note Reporting Period 02/14/22 to 03/26/22  See note below for Objective Data and Assessment of Progress/Goals.      Patient Name: Raymond Christensen MRN: [id number] DOB:January 15, 1940, 82 y.o., male Today's Date: 03/26/2022  END OF SESSION:  PT End of Session - 03/26/22 1231     Visit Number 10    Date for PT Re-Evaluation 05/09/22    Authorization Type Medicare    PT Start Time 41    PT Stop Time 1315    PT Time Calculation (min) 45 min    Activity Tolerance Patient tolerated treatment well    Behavior During Therapy [facility] for tasks assessed/performed                Past Medical History:  Diagnosis Date   Arthritis    CAD (coronary artery disease)    Carotid artery disease ([place])    s/p left CEA [id number]   Chronic systolic CHF (congestive heart failure) ([place])    Concussion Summer 2012   Essential hypertension 02/26/2017   Heart disease    Hyperlipidemia    Hypothyroidism 10/15/2017   Major depressive disorder    Mitral valve regurgitation    Myocardial infarction ([place]) 1996, 2021   Obstructive sleep apnea on CPAP    Persistent atrial fibrillation ([facility])    Poor flexibility of tendon 12/01/2018   S/P mitral valve clip implantation 12/23/2019   s/p TEER with MitraClip with one XTW by Dr. Burt Knack   Subungual hematoma of digit of hand 12/01/2018   Subungual hematoma of right foot 12/01/2018   Thyroid disease    Type 2 diabetes mellitus without complication, without long-term current use of insulin ([place]) 02/26/2017   Past Surgical History:  Procedure Laterality Date   ATRIAL FIBRILLATION ABLATION N/A 02/01/2020   Procedure: ATRIAL FIBRILLATION ABLATION;  Surgeon: [name], MD;  Location: [place] [facility];  Service: Cardiovascular;  Laterality: N/A;   BYPASS GRAFT  2001   CARDIAC CATHETERIZATION     CARDIOVERSION N/A 11/08/2019   Procedure: CARDIOVERSION;  Surgeon: [name], MD;  Location: [place];  Service: Cardiovascular;  Laterality: N/A;   CAROTID ENDARTERECTOMY Left 01/17/2014   CORONARY ANGIOPLASTY     CORONARY ARTERY BYPASS GRAFT     MITRAL VALVE REPAIR N/A 12/23/2019   Procedure: MITRAL VALVE REPAIR;  Surgeon: [name], MD;  Location: [place] [facility];  Service: Cardiovascular;  Laterality: N/A;   RIGHT/LEFT HEART CATH AND CORONARY/GRAFT ANGIOGRAPHY N/A 09/09/2019   Procedure: RIGHT/LEFT HEART CATH AND CORONARY/GRAFT ANGIOGRAPHY;  Surgeon: [name], MD;  Location: [place] [facility];  Service: Cardiovascular;  Laterality: N/A;   TEE WITHOUT CARDIOVERSION N/A 09/13/2019   Procedure: TRANSESOPHAGEAL ECHOCARDIOGRAM (TEE);  Surgeon: [name], MD;  Location: [facility] ENDOS[facility];  Service: Cardiovascular;  Laterality: N/A;   TEE WITHOUT CARDIOVERSION N/A 12/23/2019   Procedure: TRANSESOPHAGEAL ECHOCARDIOGRAM (TEE);  Surgeon: [name], MD;  Location: [place] [facility];  Service: Cardiovascular;  Laterality: N/A;   TEE WITHOUT CARDIOVERSION  02/01/2020   Procedure: TRANSESOPHAGEAL ECHOCARDIOGRAM (TEE);  Surgeon: [name], MD;  Location: [place] [facility];  Service: Cardiovascular;;   TOTAL HIP ARTHROPLASTY  1994, 1995   Patient Active Problem List   Diagnosis Date Noted   Lipoma of right upper extremity 03/02/2021   Trigger little finger of left hand 09/29/2020   Secondary hypercoagulable state ([place]) 02/29/2020   Depression, major, single episode, moderate ([place]) 02/28/2020   Nonrheumatic  tricuspid valve regurgitation    S/P mitral valve clip implantation 12/23/2019   Mitral valve disease 12/23/2019   Nonrheumatic mitral valve regurgitation    Persistent atrial fibrillation (HCC)    Chronic systolic CHF (congestive heart failure) (H[facility])    Acute idiopathic gout involving toe of left foot    Thrombocytopenia ([place]) 09/08/2019   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 ([place])    Diabetes  mellitus type 2 in obese ([place]) 06/22/2019   Major depressive disorder    Obstructive sleep apnea on CPAP    Hypothyroidism 10/15/2017   Subclavian artery stenosis, right ([place]) 06/15/2017   Type 2 diabetes mellitus without complication, without long-term current use of insulin ([place]) 02/26/2017   Severe obesity (BMI 35.0-39.9) with comorbidity ([place]) 02/26/2017   Essential hypertension 02/26/2017   Memory change 01/10/2017   Urge incontinence of urine 01/10/2017   Lightheaded 06/25/2016   AK (actinic keratosis) 01/03/2016   H/O syncope 03/06/2014   Occlusion and stenosis of left carotid artery 01/11/2014   Routine history and physical examination of adult 11/08/2013   History of repair of hip joint 04/20/2013   Hyperlipidemia 04/20/2013   Presence of artificial hip joint 04/20/2013   Vitamin D deficiency 06/18/2010   Concussion 01/24/2010   Elevated prostate specific antigen (PSA) 01/01/2007   Benign prostatic hyperplasia without urinary obstruction 04/22/2006   Osteoarthrosis 04/22/2006   Dyslipidemia 04/02/2006    PCP: Riki Sheer  REFERRING PROVIDER: Riki Sheer  REFERRING DIAG: M53.3- Sacroiliac dysfunction  THERAPY DIAG:  Muscle weakness (generalized)  Other lack of coordination  Difficulty in walking, not elsewhere classified  Rationale for Evaluation and Treatment: Rehabilitation  ONSET DATE: 12/26/21  SUBJECTIVE:   SUBJECTIVE STATEMENT: "Pretty good"  PERTINENT HISTORY: Past medical history significant for coronary artery disease, type 2 diabetes mellitus, previous MI, chronic systolic CHF, persistent A-fib  Had a fall on 02/01/22- tripped in a grocery store  PAIN:  Are you having pain? Yes: NPRS scale: 0/10 Pain location: head, shoulder, hip Pain description: sharp if I press on it other than that it is just dull Aggravating factors: nothing specifically Relieving factors: medicine  PRECAUTIONS: None  WEIGHT BEARING RESTRICTIONS:  No  FALLS:  Has patient fallen in last 6 months? Yes. Number of falls 2  LIVING ENVIRONMENT: Lives with: lives with their spouse Lives in: House/apartment Stairs: No Has following equipment at home: Environmental consultant - 2 wheeled  OCCUPATION: Retired  PLOF: Independent  PATIENT GOALS: I want to make sure I can become ambulatory again and get around without the walker  NEXT MD VISIT:   OBJECTIVE:   DIAGNOSTIC FINDINGS:  CT cervical spine:   No evidence of acute fracture or traumatic malalignment.  CT head:   1. No evidence of acute intracranial abnormality. 2. Stable chronic hypodense bilateral subdural collections. 3. Right forehead contusion.  IMPRESSION: Negative.  COGNITION: Overall cognitive status: Within functional limits for tasks assessed, some delay in response and wife reports some memory deficits    SENSATION: WFL   MUSCLE LENGTH: Hamstrings:bilateral tightness, more in LLE  POSTURE: rounded shoulders, forward head, and flexed trunk   PALPATION: Tightness in bilateral UT, more in R side LOWER EXTREMITY ROM: grossly WFL some pain with R side hip flexion    LOWER EXTREMITY MMT: grossly 4/5    FUNCTIONAL TESTS:  5 times sit to stand: 21.31s with UE use Timed up and go (TUG): 22.27s  GAIT: Distance walked: in clinic distances Assistive device utilized:  Walker - 2 wheeled Level of assistance: Modified independence Comments: shuffling feet, decreased stance time bilaterally, decrease stride length, poor foot clearance   TODAY'S TREATMENT:                                                                                                                              DATE:  03/26/22 Reassess goals- TUG and 5xSTS BERG 46/56 Walking outdoors around [employer] NuStep L5 x64mns  HS stretch seated 30s  Knees to chest and piriformis stretch supine 30s Marching on airex Side steps on airex Walking on beam   03/21/22 NuStep L5 x 6 min Resisted side step over  objets 20lb x5 each On airex ball toss  S2S OHP on airex 2x10 6in step ups from airex 2x5 each Side step on an off airex  Calf stretch with black bar  35lb hamstring curls 2x10 Leg Ext 10lb 2x10 Passive Hs and piriformis stretch.    03/19/22 NuStep L5 x 6 min Resisted gait 30lb 4 way x 4 each Shoulder Ext for core 10lb 2x10  On airex alt 6in box taps x10 S2S on airex 2x10 Alternate taps on cone on the floor x 10, mildly unsteady.  Side steps and tandem waling on balance beam in // bars Leg press 40# 3x12 HS self stretch   03/14/22 NuStep L5 x 4 minutes B side stepping onto and off Air Ex pad x 10 each direction. No UE support, S for balance. Step back with RLE, rotating to R to place object on mat behind, then return forward. % x to each side, no difficulty with balance. Alternate taps on cone on the floor x 10, mildly unsteady.  Progressed to multiple taps with each foot, mod difficulty occasional light min A for balance. Floor ladder- walking with one foot in each square x 4, side stepping into each square x 2, changing direction upon therapist command x 1 Walking on floor mat, forward, back, side stepping, turning, slow walking, CGA, mildly unsteady, but no LOB.   03/11/22 NuStep L5 x 6 minutes. Trunk mobilization-seated, rotate to L, walk hands out and back x 4, repeat to R Seated HS stretch, 3 x 15 sec each leg Supine stretch with strap, Abd, Add, figure 4, ITB, LTR, 3 x 15 sec each. Balance/mobility training in the parallel bars-crossover and back with trunk rotation, minimize UE support, but required at least UUE. Quick steps side to side x 30 seconds Ambulation with quick changes of direction, forward, back, side to side, increased speed, approximately 200'   03/07/22 Bike L3 x530ms  2 way hip 3# 2x10 Marching on airex in // bars  Side steps on beam in // bars Narrow walking on beam in // bars Shoulder ext 10# 2x10 STS with OHP yellow 2x10 Leg press 40#  2x10 Calf raises 2x10   03/05/22 Spoke with him and answered their questions STS with weighted yellow ball 2x5 Cone toe touches with light HHA Nustep  Level 5 x 6 minutes Seated pball roll outs for back stretches Side step over objects, side step on and off airex In pbars, marching, airex balance beam side stepping and tandem walking Fast gait 250 feet Ball toss, volleyball Direction changes   03/01/22 NuStep L5 x2mns Feet on pball trunk rotations, knees to chest x10 Bridges 2x10  STS on airex 2x10 Box taps 6" Side steps over obstacles Step ups on airex- minA   02/26/22 Bike L2 x 5 minutes Supine DKTC over physioball x 5, hold 5 sec Supine bridge over physioball, repeat with B hip IR, 10 each Seated clamshells and active add for lateral hip strengthening and stretch x 10, G tband. Sit to stqnd on Air ex pad, holding 2# ball in BUE with OHP at top. Min TC and VC to lean forward sufficiently to balance. 10 reps. B side to side stepping against G Tband resistance, B HHA, 2 x 10 reps each direction. B side stepping over dowels on the floor. Heel raises on black bar, 20 reps, BUE support for balance. Ambulation, walking x approximately 200', including multiple turns, avoiding obstacles, backwards stepping. No AD, no unsteadiness noted.  02/14/22- EVAL    PATIENT EDUCATION:  Education details: POC and HEP Person educated: Patient and Spouse Education method: Explanation Education comprehension: verbalized understanding  HOME EXERCISE PROGRAM:  Access Code: [email or web address] URL: [email or web address] Date: 02/14/2022 Prepared by: MAndris Baumann Exercises - Sit to Stand with Arms Crossed  - 1 x daily - 7 x weekly - 2 sets - 10 reps - Standing March with Counter Support  - 1 x daily - 7 x weekly - 2 sets - 10 reps - Standing Hip Abduction with Unilateral Counter Support  - 1 x daily - 7 x weekly - 2 sets - 10 reps - Standing with Head Rotation  - 1 x daily - 7 x weekly  - 2 sets - 5 reps - 20 hold - Romberg Stance with Head Nods  - 1 x daily - 7 x weekly - 2 sets - 5 reps - 20 hold  ASSESSMENT:  CLINICAL IMPRESSION: Patient reports no new issues and has met most of his goals for PT. He states he feels good about meeting his goals but is concerned about his ability to move and bend. His hamstrings are still very tight. He would also like to continue working on his balance. Added goals for flexibility and balance   OBJECTIVE IMPAIRMENTS: Abnormal gait, decreased balance, decreased mobility, difficulty walking, decreased strength, and pain.   ACTIVITY LIMITATIONS: bending, stairs, transfers, bed mobility, and locomotion level  PARTICIPATION LIMITATIONS: driving, shopping, and yard work  RBrink's CompanyPOTENTIAL: Good  CLINICAL DECISION MAKING: Stable/uncomplicated  EVALUATION COMPLEXITY: Low   GOALS: Goals reviewed with patient? Yes  SHORT TERM GOALS: Target date: 03/28/22  Patient will be independent with initial HEP. Goal status: met  2.  Patient will demonstrate decreased fall risk by scoring < 15 sec on TUG. Baseline: 22.27s, 9.78s Goal status: MET  3.  Patient will be educated on strategies to decrease risk of falls.  Goal status: MET   LONG TERM GOALS: Target date: 05/09/22  Patient will be independent with advanced/ongoing HEP to improve outcomes and carryover.  Goal status: IN PROGRESS  2.  Patient will be able to ambulate 600' with LRAD with good safety to access community.  Baseline: using RW after fall Goal status: MET   3.  Patient will demonstrate improved functional LE strength as demonstrated by  decreased 5xSTS to <14s. Baseline: 21.21s, 11.80s Goal status: MET   4.  Patient will score 46 on Berg Balance test to demonstrate lower risk of falls. (MCID= 8 points) .  Baseline: 38, 46 Goal status: MET  5. Patient will demonstrate increased flexibility by being able to touch ankles   Baseline: mid shin  Goal status: INITIAL  6.  Patient will demonstrate SLS at least 3 seconds or more bilaterally   Baseline: unable to do  Goal status: INITIAL   PLAN:  PT FREQUENCY: 1-2x/week  PT DURATION: 12 weeks  PLANNED INTERVENTIONS: Therapeutic exercises, Therapeutic activity, Neuromuscular re-education, Balance training, Gait training, Patient/Family education, Self Care, Joint mobilization, Stair training, Dry Needling, Electrical stimulation, Cryotherapy, Moist heat, Vasopneumatic device, Ionotophoresis '4mg'$ /ml Dexamethasone, and Manual therapy  PLAN FOR NEXT SESSION: work on strength and balance and stretch!!  Andris Baumann, DPT 03/26/22 1:11 PM

## 2022-03-26 ENCOUNTER — Ambulatory Visit: Payer: Medicare (Managed Care) | Admitting: Occupational Therapy

## 2022-03-26 ENCOUNTER — Encounter: Payer: Self-pay | Admitting: Occupational Therapy

## 2022-03-26 ENCOUNTER — Ambulatory Visit: Payer: Medicare (Managed Care)

## 2022-03-26 DIAGNOSIS — M6281 Muscle weakness (generalized): Secondary | ICD-10-CM

## 2022-03-26 DIAGNOSIS — R278 Other lack of coordination: Secondary | ICD-10-CM

## 2022-03-26 DIAGNOSIS — R41840 Attention and concentration deficit: Secondary | ICD-10-CM

## 2022-03-26 DIAGNOSIS — R41844 Frontal lobe and executive function deficit: Secondary | ICD-10-CM

## 2022-03-26 DIAGNOSIS — R262 Difficulty in walking, not elsewhere classified: Secondary | ICD-10-CM

## 2022-03-26 NOTE — Therapy (Signed)
OUTPATIENT PHYSICAL THERAPY LOWER EXTREMITY TREATMENT  Patient Name: Raymond Christensen MRN: [id number] DOB:17-Feb-1940, 82 y.o., male Today's Date: 03/28/2022  END OF SESSION:  PT End of Session - 03/28/22 1146     Visit Number 11    Date for PT Re-Evaluation 05/09/22    Authorization Type Medicare    PT Start Time 1145    PT Stop Time 1230    PT Time Calculation (min) 45 min    Activity Tolerance Patient tolerated treatment well    Behavior During Therapy [facility] for tasks assessed/performed                 Past Medical History:  Diagnosis Date   Arthritis    CAD (coronary artery disease)    Carotid artery disease ([place])    s/p left CEA 123456   Chronic systolic CHF (congestive heart failure) ([place])    Concussion Summer 2012   Essential hypertension 02/26/2017   Heart disease    Hyperlipidemia    Hypothyroidism 10/15/2017   Major depressive disorder    Mitral valve regurgitation    Myocardial infarction ([place]) 1996, 2021   Obstructive sleep apnea on CPAP    Persistent atrial fibrillation (HCC)    Poor flexibility of tendon 12/01/2018   S/P mitral valve clip implantation 12/23/2019   s/p TEER with MitraClip with one XTW by Dr. Burt Knack   Subungual hematoma of digit of hand 12/01/2018   Subungual hematoma of right foot 12/01/2018   Thyroid disease    Type 2 diabetes mellitus without complication, without long-term current use of insulin ([place]) 02/26/2017   Past Surgical History:  Procedure Laterality Date   ATRIAL FIBRILLATION ABLATION N/A 02/01/2020   Procedure: ATRIAL FIBRILLATION ABLATION;  Surgeon: [name], MD;  Location: [place];  Service: Cardiovascular;  Laterality: N/A;   BYPASS GRAFT  2001   CARDIAC CATHETERIZATION     CARDIOVERSION N/A 11/08/2019   Procedure: CARDIOVERSION;  Surgeon: [name], MD;  Location: [facility];  Service: Cardiovascular;  Laterality: N/A;   CAROTID ENDARTERECTOMY Left 01/17/2014   CORONARY ANGIOPLASTY      CORONARY ARTERY BYPASS GRAFT     MITRAL VALVE REPAIR N/A 12/23/2019   Procedure: MITRAL VALVE REPAIR;  Surgeon: [name], MD;  Location: [place] [facility];  Service: Cardiovascular;  Laterality: N/A;   RIGHT/LEFT HEART CATH AND CORONARY/GRAFT ANGIOGRAPHY N/A 09/09/2019   Procedure: RIGHT/LEFT HEART CATH AND CORONARY/GRAFT ANGIOGRAPHY;  Surgeon: [name], MD;  Location: [place] [facility];  Service: Cardiovascular;  Laterality: N/A;   TEE WITHOUT CARDIOVERSION N/A 09/13/2019   Procedure: TRANSESOPHAGEAL ECHOCARDIOGRAM (TEE);  Surgeon: [name], MD;  Location: [facility];  Service: Cardiovascular;  Laterality: N/A;   TEE WITHOUT CARDIOVERSION N/A 12/23/2019   Procedure: TRANSESOPHAGEAL ECHOCARDIOGRAM (TEE);  Surgeon: [name], MD;  Location: [place] [facility];  Service: Cardiovascular;  Laterality: N/A;   TEE WITHOUT CARDIOVERSION  02/01/2020   Procedure: TRANSESOPHAGEAL ECHOCARDIOGRAM (TEE);  Surgeon: [name], MD;  Location: [place] [facility];  Service: Cardiovascular;;   TOTAL HIP ARTHROPLASTY  1994, 1995   Patient Active Problem List   Diagnosis Date Noted   Lipoma of right upper extremity 03/02/2021   Trigger little finger of left hand 09/29/2020   Secondary hypercoagulable state ([place]) 02/29/2020   Depression, major, single episode, moderate (HCC) 02/28/2020   Nonrheumatic tricuspid valve regurgitation    S/P mitral valve clip implantation 12/23/2019   Mitral valve disease 12/23/2019   Nonrheumatic  mitral valve regurgitation    Persistent atrial fibrillation (HCC)    Chronic systolic CHF (congestive heart failure) (HCC)    Acute idiopathic gout involving toe of left foot    Thrombocytopenia ([place]) 09/08/2019   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 ([place])    Diabetes mellitus type 2 in obese ([place]) 06/22/2019   Major depressive disorder    Obstructive sleep apnea on CPAP    Hypothyroidism  10/15/2017   Subclavian artery stenosis, right ([place]) 06/15/2017   Type 2 diabetes mellitus without complication, without long-term current use of insulin ([place]) 02/26/2017   Severe obesity (BMI 35.0-39.9) with comorbidity ([place]) 02/26/2017   Essential hypertension 02/26/2017   Memory change 01/10/2017   Urge incontinence of urine 01/10/2017   Lightheaded 06/25/2016   AK (actinic keratosis) 01/03/2016   H/O syncope 03/06/2014   Occlusion and stenosis of left carotid artery 01/11/2014   Routine history and physical examination of adult 11/08/2013   History of repair of hip joint 04/20/2013   Hyperlipidemia 04/20/2013   Presence of artificial hip joint 04/20/2013   Vitamin D deficiency 06/18/2010   Concussion 01/24/2010   Elevated prostate specific antigen (PSA) 01/01/2007   Benign prostatic hyperplasia without urinary obstruction 04/22/2006   Osteoarthrosis 04/22/2006   Dyslipidemia 04/02/2006    PCP: Riki Sheer  REFERRING PROVIDER: Riki Sheer  REFERRING DIAG: M53.3- Sacroiliac dysfunction  THERAPY DIAG:  Muscle weakness (generalized)  Other lack of coordination  Difficulty in walking, not elsewhere classified  Repeated falls  Rationale for Evaluation and Treatment: Rehabilitation  ONSET DATE: 12/26/21  SUBJECTIVE:   SUBJECTIVE STATEMENT: Feeling just peachy  PERTINENT HISTORY: Past medical history significant for coronary artery disease, type 2 diabetes mellitus, previous MI, chronic systolic CHF, persistent A-fib  Had a fall on 02/01/22- tripped in a grocery store  PAIN:  Are you having pain? Yes: NPRS scale: 0/10 Pain location: head, shoulder, hip Pain description: sharp if I press on it other than that it is just dull Aggravating factors: nothing specifically Relieving factors: medicine  PRECAUTIONS: None  WEIGHT BEARING RESTRICTIONS: No  FALLS:  Has patient fallen in last 6 months? Yes. Number of falls 2  LIVING ENVIRONMENT: Lives with:  lives with their spouse Lives in: House/apartment Stairs: No Has following equipment at home: Environmental consultant - 2 wheeled  OCCUPATION: Retired  PLOF: Independent  PATIENT GOALS: I want to make sure I can become ambulatory again and get around without the walker  NEXT MD VISIT:   OBJECTIVE:   DIAGNOSTIC FINDINGS:  CT cervical spine:   No evidence of acute fracture or traumatic malalignment.  CT head:   1. No evidence of acute intracranial abnormality. 2. Stable chronic hypodense bilateral subdural collections. 3. Right forehead contusion.  IMPRESSION: Negative.  COGNITION: Overall cognitive status: Within functional limits for tasks assessed, some delay in response and wife reports some memory deficits    SENSATION: WFL   MUSCLE LENGTH: Hamstrings:bilateral tightness, more in LLE  POSTURE: rounded shoulders, forward head, and flexed trunk   PALPATION: Tightness in bilateral UT, more in R side LOWER EXTREMITY ROM: grossly WFL some pain with R side hip flexion    LOWER EXTREMITY MMT: grossly 4/5    FUNCTIONAL TESTS:  5 times sit to stand: 21.31s with UE use Timed up and go (TUG): 22.27s  GAIT: Distance walked: in clinic distances Assistive device utilized: Walker - 2 wheeled Level of assistance: Modified independence Comments: shuffling feet, decreased stance time bilaterally, decrease  stride length, poor foot clearance   TODAY'S TREATMENT:                                                                                                                              DATE:  03/28/22 NuStep L5 x74mns Passive stretching of HS, piriformis, glutes, IT band  Knees to chest 30s  Trunk rotations 2x10 Bridges 2x10  SLS at counter 20s  Tandem standing at counter STS on airex Standing on airex cone taps 20 reps CGA req Standing on airex shoulder ext 10# 2x10    03/26/22 Reassess goals- TUG and 5xSTS BERG 46/56 Walking outdoors around iCable[employer] SystemsNuStep L5 x630ms  HS  stretch seated 30s  Knees to chest and piriformis stretch supine 30s Marching on airex Side steps on airex Walking on beam   03/21/22 NuStep L5 x 6 min Resisted side step over objets 20lb x5 each On airex ball toss  S2S OHP on airex 2x10 6in step ups from airex 2x5 each Side step on an off airex  Calf stretch with black bar  35lb hamstring curls 2x10 Leg Ext 10lb 2x10 Passive Hs and piriformis stretch.    03/19/22 NuStep L5 x 6 min Resisted gait 30lb 4 way x 4 each Shoulder Ext for core 10lb 2x10  On airex alt 6in box taps x10 S2S on airex 2x10 Alternate taps on cone on the floor x 10, mildly unsteady.  Side steps and tandem waling on balance beam in // bars Leg press 40# 3x12 HS self stretch   03/14/22 NuStep L5 x 4 minutes B side stepping onto and off Air Ex pad x 10 each direction. No UE support, S for balance. Step back with RLE, rotating to R to place object on mat behind, then return forward. % x to each side, no difficulty with balance. Alternate taps on cone on the floor x 10, mildly unsteady.  Progressed to multiple taps with each foot, mod difficulty occasional light min A for balance. Floor ladder- walking with one foot in each square x 4, side stepping into each square x 2, changing direction upon therapist command x 1 Walking on floor mat, forward, back, side stepping, turning, slow walking, CGA, mildly unsteady, but no LOB.   03/11/22 NuStep L5 x 6 minutes. Trunk mobilization-seated, rotate to L, walk hands out and back x 4, repeat to R Seated HS stretch, 3 x 15 sec each leg Supine stretch with strap, Abd, Add, figure 4, ITB, LTR, 3 x 15 sec each. Balance/mobility training in the parallel bars-crossover and back with trunk rotation, minimize UE support, but required at least UUE. Quick steps side to side x 30 seconds Ambulation with quick changes of direction, forward, back, side to side, increased speed, approximately 200'   03/07/22 Bike L3 x5m44m  2 way  hip 3# 2x10 Marching on airex in // bars  Side steps on beam in // bars Narrow walking on beam in // bars Shoulder  ext 10# 2x10 STS with OHP yellow 2x10 Leg press 40# 2x10 Calf raises 2x10   03/05/22 Spoke with him and answered their questions STS with weighted yellow ball 2x5 Cone toe touches with light HHA Nustep Level 5 x 6 minutes Seated pball roll outs for back stretches Side step over objects, side step on and off airex In pbars, marching, airex balance beam side stepping and tandem walking Fast gait 250 feet Ball toss, volleyball Direction changes   03/01/22 NuStep L5 x94mns Feet on pball trunk rotations, knees to chest x10 Bridges 2x10  STS on airex 2x10 Box taps 6" Side steps over obstacles Step ups on airex- minA   02/26/22 Bike L2 x 5 minutes Supine DKTC over physioball x 5, hold 5 sec Supine bridge over physioball, repeat with B hip IR, 10 each Seated clamshells and active add for lateral hip strengthening and stretch x 10, G tband. Sit to stqnd on Air ex pad, holding 2# ball in BUE with OHP at top. Min TC and VC to lean forward sufficiently to balance. 10 reps. B side to side stepping against G Tband resistance, B HHA, 2 x 10 reps each direction. B side stepping over dowels on the floor. Heel raises on black bar, 20 reps, BUE support for balance. Ambulation, walking x approximately 200', including multiple turns, avoiding obstacles, backwards stepping. No AD, no unsteadiness noted.  02/14/22- EVAL    PATIENT EDUCATION:  Education details: POC and HEP Person educated: Patient and Spouse Education method: Explanation Education comprehension: verbalized understanding  HOME EXERCISE PROGRAM:  Access Code: [email or web address] URL: [email or web address] Date: 02/14/2022 Prepared by: MAndris Baumann Exercises - Sit to Stand with Arms Crossed  - 1 x daily - 7 x weekly - 2 sets - 10 reps - Standing March with Counter Support  - 1 x daily - 7 x weekly - 2  sets - 10 reps - Standing Hip Abduction with Unilateral Counter Support  - 1 x daily - 7 x weekly - 2 sets - 10 reps - Standing with Head Rotation  - 1 x daily - 7 x weekly - 2 sets - 5 reps - 20 hold - Romberg Stance with Head Nods  - 1 x daily - 7 x weekly - 2 sets - 5 reps - 20 hold  ASSESSMENT:  CLINICAL IMPRESSION: Patient doing well overall, no reports of falls or stumbles. He demonstrates good balance with SL and tandem stance today which is a big improvement from last week. Continued to work on stretching as he is still very tight especially in hips and hamstrings.   OBJECTIVE IMPAIRMENTS: Abnormal gait, decreased balance, decreased mobility, difficulty walking, decreased strength, and pain.   ACTIVITY LIMITATIONS: bending, stairs, transfers, bed mobility, and locomotion level  PARTICIPATION LIMITATIONS: driving, shopping, and yard work  RBrink's CompanyPOTENTIAL: Good  CLINICAL DECISION MAKING: Stable/uncomplicated  EVALUATION COMPLEXITY: Low   GOALS: Goals reviewed with patient? Yes  SHORT TERM GOALS: Target date: 03/28/22  Patient will be independent with initial HEP. Goal status: met  2.  Patient will demonstrate decreased fall risk by scoring < 15 sec on TUG. Baseline: 22.27s, 9.78s Goal status: MET  3.  Patient will be educated on strategies to decrease risk of falls.  Goal status: MET   LONG TERM GOALS: Target date: 05/09/22  Patient will be independent with advanced/ongoing HEP to improve outcomes and carryover.  Goal status: IN PROGRESS  2.  Patient will be able to ambulate 600' with LRAD with good  safety to access community.  Baseline: using RW after fall Goal status: MET   3.  Patient will demonstrate improved functional LE strength as demonstrated by decreased 5xSTS to <14s. Baseline: 21.21s, 11.80s Goal status: MET   4.  Patient will score 46 on Berg Balance test to demonstrate lower risk of falls. (MCID= 8 points) .  Baseline: 38, 46 Goal status:  MET  5. Patient will demonstrate increased flexibility by being able to touch ankles   Baseline: mid shin  Goal status: INITIAL  6. Patient will demonstrate SLS at least 3 seconds or more bilaterally   Baseline: unable to do 03/26/22 >10s BLE 03/28/22  Goal status: MET   PLAN:  PT FREQUENCY: 1-2x/week  PT DURATION: 12 weeks  PLANNED INTERVENTIONS: Therapeutic exercises, Therapeutic activity, Neuromuscular re-education, Balance training, Gait training, Patient/Family education, Self Care, Joint mobilization, Stair training, Dry Needling, Electrical stimulation, Cryotherapy, Moist heat, Vasopneumatic device, Ionotophoresis '4mg'$ /ml Dexamethasone, and Manual therapy  PLAN FOR NEXT SESSION: work on strength and balance and stretch!!  Andris Baumann, DPT 03/28/22 12:31 PM

## 2022-03-26 NOTE — Therapy (Addendum)
OUTPATIENT OCCUPATIONAL THERAPY NEURO Treatment  Patient Name: Raymond Christensen MRN: [id number] DOB:14-Oct-1940, 82 y.o., male Today's Date: 03/26/2022  PCP:Dr. Nani Ravens REFERRING PROVIDER: Dr. Nani Ravens  END OF SESSION:  OT End of Session - 03/26/22 1504     Visit Number 4    Number of Visits 17    Date for OT Re-Evaluation 05/06/22    Authorization Type generic Medicare    Authorization - Visit Number 4    Progress Note Due on Visit 10    OT Start Time 1317    OT Stop Time 1400    OT Time Calculation (min) 43 min    Activity Tolerance Patient tolerated treatment well    Behavior During Therapy [facility] for tasks assessed/performed                Past Medical History:  Diagnosis Date   Arthritis    CAD (coronary artery disease)    Carotid artery disease ([place])    s/p left CEA [id number]   Chronic systolic CHF (congestive heart failure) ([place])    Concussion Summer 2012   Essential hypertension 02/26/2017   Heart disease    Hyperlipidemia    Hypothyroidism 10/15/2017   Major depressive disorder    Mitral valve regurgitation    Myocardial infarction ([place]) 1996, 2021   Obstructive sleep apnea on CPAP    Persistent atrial fibrillation ([facility])    Poor flexibility of tendon 12/01/2018   S/P mitral valve clip implantation 12/23/2019   s/p TEER with MitraClip with one XTW by Dr. Burt Knack   Subungual hematoma of digit of hand 12/01/2018   Subungual hematoma of right foot 12/01/2018   Thyroid disease    Type 2 diabetes mellitus without complication, without long-term current use of insulin ([place]) 02/26/2017   Past Surgical History:  Procedure Laterality Date   ATRIAL FIBRILLATION ABLATION N/A 02/01/2020   Procedure: ATRIAL FIBRILLATION ABLATION;  Surgeon: [name], MD;  Location: [place] [facility];  Service: Cardiovascular;  Laterality: N/A;   BYPASS GRAFT  2001   CARDIAC CATHETERIZATION     CARDIOVERSION N/A 11/08/2019   Procedure: CARDIOVERSION;  Surgeon: [name], MD;  Location: [place];  Service: Cardiovascular;  Laterality: N/A;   CAROTID ENDARTERECTOMY Left 01/17/2014   CORONARY ANGIOPLASTY     CORONARY ARTERY BYPASS GRAFT     MITRAL VALVE REPAIR N/A 12/23/2019   Procedure: MITRAL VALVE REPAIR;  Surgeon: [name], MD;  Location: [place] [facility];  Service: Cardiovascular;  Laterality: N/A;   RIGHT/LEFT HEART CATH AND CORONARY/GRAFT ANGIOGRAPHY N/A 09/09/2019   Procedure: RIGHT/LEFT HEART CATH AND CORONARY/GRAFT ANGIOGRAPHY;  Surgeon: [name], MD;  Location: [place] [facility];  Service: Cardiovascular;  Laterality: N/A;   TEE WITHOUT CARDIOVERSION N/A 09/13/2019   Procedure: TRANSESOPHAGEAL ECHOCARDIOGRAM (TEE);  Surgeon: [name], MD;  [facility]: [facility];  Service: Cardiovascular;  Laterality: N/A;   TEE WITHOUT CARDIOVERSION N/A 12/23/2019   Procedure: TRANSESOPHAGEAL ECHOCARDIOGRAM (TEE);  Surgeon: [name], MD;  Location: [place] [facility];  Service: Cardiovascular;  Laterality: N/A;   TEE WITHOUT CARDIOVERSION  02/01/2020   Procedure: TRANSESOPHAGEAL ECHOCARDIOGRAM (TEE);  Surgeon: [name], MD;  Location: [place] [facility];  Service: Cardiovascular;;   TOTAL HIP ARTHROPLASTY  1994, 1995   Patient Active Problem List   Diagnosis Date Noted   Lipoma of right upper extremity 03/02/2021   Trigger little finger of left hand 09/29/2020   Secondary hypercoagulable state ([place]) 02/29/2020   Depression,  major, single episode, moderate ([place]) 02/28/2020   Nonrheumatic tricuspid valve regurgitation    S/P mitral valve clip implantation 12/23/2019   Mitral valve disease 12/23/2019   Nonrheumatic mitral valve regurgitation    Persistent atrial fibrillation (HCC)    Chronic systolic CHF (congestive heart failure) (H[facility])    Acute idiopathic gout involving toe of left foot    Thrombocytopenia ([place]) 09/08/2019   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  ([place])    Diabetes mellitus type 2 in obese ([place]) 06/22/2019   Major depressive disorder    Obstructive sleep apnea on CPAP    Hypothyroidism 10/15/2017   Subclavian artery stenosis, right ([place]) 06/15/2017   Type 2 diabetes mellitus without complication, without long-term current use of insulin ([place]) 02/26/2017   Severe obesity (BMI 35.0-39.9) with comorbidity ([place]) 02/26/2017   Essential hypertension 02/26/2017   Memory change 01/10/2017   Urge incontinence of urine 01/10/2017   Lightheaded 06/25/2016   AK (actinic keratosis) 01/03/2016   H/O syncope 03/06/2014   Occlusion and stenosis of left carotid artery 01/11/2014   Routine history and physical examination of adult 11/08/2013   History of repair of hip joint 04/20/2013   Hyperlipidemia 04/20/2013   Presence of artificial hip joint 04/20/2013   Vitamin D deficiency 06/18/2010   Concussion 01/24/2010   Elevated prostate specific antigen (PSA) 01/01/2007   Benign prostatic hyperplasia without urinary obstruction 04/22/2006   Osteoarthrosis 04/22/2006   Dyslipidemia 04/02/2006    ONSET DATE: 02/01/22  REFERRING DIAG:  R29.6 (ICD-10-CM) - Recurrent falls  S06.0X0A (ICD-10-CM) - Concussion without loss of consciousness, initial encounter    THERAPY DIAG:  Muscle weakness (generalized)  Other lack of coordination  Attention and concentration deficit  Frontal lobe and executive function deficit  Rationale for Evaluation and Treatment: Rehabilitation  SUBJECTIVE:   SUBJECTIVE STATEMENT: Pt denies pain Pt accompanied by: self  PERTINENT HISTORY: Past medical history significant for coronary artery disease, type 2 diabetes mellitus, previous MI, chronic systolic CHF, persistent A-fib  Had a fall on 02/01/22- tripped in a grocery store   DIAGNOSTIC FINDINGS:  CT cervical spine:   No evidence of acute fracture or traumatic malalignment.   CT head:   1. No evidence of acute intracranial abnormality. 2. Stable  chronic hypodense bilateral subdural collections. 3. Right forehead contusion.     PAIN:   PRECAUTIONS: Fall  WEIGHT BEARING RESTRICTIONS: No  PAIN:  Are you having pain? No  FALLS: Has patient fallen in last 6 months? Yes. Number of falls 2  LIVING ENVIRONMENT: Lives with: lives with their spouse Lives in: House/apartment Stairs: No Has following equipment at home:  built in shower bench  PLOF: Needs assistance with ADLs  PATIENT GOALS: increased ease with LB dressing  OBJECTIVE:   HAND DOMINANCE: Right  ADLs: Overall ADLs: needs assist with LB Transfers/ambulation related to ADLs:falls in grocery store Eating: mod I Grooming: mod I UB Dressing: mod I LB Dressing: needs help with socks Toileting: mod I Bathing: mod I Tub Shower transfers: mod I   IADLs: Shopping: needs assist, fell in grocery store x 2 Light housekeeping: pt's wife is doing more currently Meal Prep: cooks meals mod I Community mobility: falls in walking in grocery Medication management: keeps up with own meds Financial management: pt reports he handles finances Handwriting: 90% legible  MOBILITY STATUS: Hx of falls      UPPER EXTREMITY ROM:  [place] MMT:  MMT Right eval Left eval  Shoulder flexion 4/5 4+/5  Shoulder abduction    Shoulder adduction    Shoulder extension    Shoulder internal rotation    Shoulder external rotation    Middle trapezius    Lower trapezius    Elbow flexion 4+/5 4+/5  Elbow extension 4+/5 4+/5  Wrist flexion    Wrist extension    Wrist ulnar deviation    Wrist radial deviation    Wrist pronation    Wrist supination    (Blank rows = not tested)  HAND FUNCTION: Grip strength: Right: 53 lbs; Left: 73 lbs  COORDINATION: 9 Hole Peg test: Right: 41.74 sec; Left: 33.53 sec Action tremor present SENSATION: WFL    COGNITION: Overall cognitive status: Impaired recall 2/3 items following short delay, may have delayed  processing , cognition to be further addressed within a functional context   VISION: Subjective report: denies visual changes  Baseline vision: Wears glasses all the time   VISION ASSESSMENT: Not tested     OBSERVATIONS: action tremor bilateral UE's   TODAY'S TREATMENT:                                                                                                                              DATE:3/12- Reviewed green theraband HEP 10 reps each, bilateral UE's today,  min v.c initially then pt returned demonstration.  Arm bike x 6 min level 3 for conditioning.  Gripper set at level 3 to pick up 1 inch blocks, resistance increased to level 4 to pick up second 1/2 of blocks, min difficulty/ drops, 1 rest break.  Grooved pegboard for increased fine motor coordination and in hand manipulation, min difficulty for in hand manipulation.  Handwriting activity using foam grip, pt printed the alphabet with v.c for positioning, and cues for letter size then pt printed several sentences with good legibility and letter size.      03/19/22- Pt practiced doffing then donning socks using reach, sock aide, min difficulty and increased time and min v.c Pt was provided with information for purchase.   Purdue pegboard for increased RUE fine motor coordination and in hand  manipulation, min difficulty/ v.c  Green theraband exercises 10-15 reps each, min v.c for proximal strengthening.  Memory compensation strategies, reviewed and handout issued    03/14/22 - Pt was instructed in HEP for fine motor coordination and green putty for grip strength. Pt returned demonstration with min v.c . Pt was cautioned to toss ball over a table and not to chase after the ball if he drops it due to fall risk. Handwriting activity practicing with and without  using foam grip on pen and cues to support elbow on tabletop. Pt with improved handwriting using foam grip and cues to slow down and deliberately form each  letter.  Gripper set at level 4 block spring for sustained grip to pick up 1 inch blocks, 2-3 rest breaks required due to fatigue.  Arm bike x 5 min s level 3 for conditioning.  PATIENT EDUCATION Person educated: Patient Education method: Explanation, Demonstration, Verbal cues Education comprehension: verbalized understanding, returned demonstration, and verbal cues required   HOME EXERCISE PROGRAM: N/A   GOALS: Goals reviewed with patient? Yes  SHORT TERM GOALS: Target date: 04/08/22  I with HEP for coordination and grip strength Baseline: Goal status met HEP issued 03/14/22  2.  Pt will verbalize understanding of compensatory strategies for short term memory deficits. Baseline: recalls 2/3 items following short delay Goal status: met, pt verbalizes understanding. 03/26/22  3.  Pt will increase RUE grip strength by 5 lbs for increased functional use Baseline: Grip strength: Right: 53 lbs; Left: 73 lbs Goal status:ongoing- HEP issued 03/14/22   LONG TERM GOALS: Target date: 05/06/22  I with HEP for proximal strength Baseline:  Goal status: ongoing, issued, may benefit from updates 03/26/22  2.  Pt will demonstrate improved fine motor coordination for ADLS as evidenced by decreasing 9 hole peg test by 3 secs for RUE. Baseline: 9 Hole Peg test: Right: 41.74 sec; Left: 33.53 sec Goal status: INITIAL  3.  Pt will donn socks mod I. Baseline: needs assist Goal status: met, pt purchased a sock aide and he is now donning his own socks, 03/26/22 4.  Pt will resume prior level of home management activities demonstrating good safety awareness. Baseline: pt's wife is currently performing. Goal status: INITIAL    ASSESSMENT:  CLINICAL IMPRESSION: Pt is progressing towards goals with improving UE strength and coordination.   PERFORMANCE DEFICITS: in functional skills including ADLs, IADLs, coordination, dexterity, tone, ROM, strength, pain, flexibility, Fine motor control, Gross  motor control, mobility, balance, endurance, decreased knowledge of precautions, decreased knowledge of use of DME, vision, and UE functional use, cognitive skills including attention, memory, problem solving, and thought, and psychosocial skills including coping strategies, environmental adaptation, habits, interpersonal interactions, and routines and behaviors.   IMPAIRMENTS: are limiting patient from ADLs, IADLs, play, leisure, and social participation.   CO-MORBIDITIES: may have co-morbidities  that affects occupational performance. Patient will benefit from skilled OT to address above impairments and improve overall function.  MODIFICATION OR ASSISTANCE TO COMPLETE EVALUATION: No modification of tasks or assist necessary to complete an evaluation.  OT OCCUPATIONAL PROFILE AND HISTORY: Problem focused assessment: Including review of records relating to presenting problem.  CLINICAL DECISION MAKING: LOW - limited treatment options, no task modification necessary  REHAB POTENTIAL: Good  EVALUATION COMPLEXITY: Low    PLAN:  OT FREQUENCY:2x/ week - resent to update frequency  OT DURATION: 8 weeks plus eval  PLANNED INTERVENTIONS: self care/ADL training, therapeutic exercise, therapeutic activity, neuromuscular re-education, manual therapy, passive range of motion, balance training, functional mobility training, moist heat, patient/family education, cognitive remediation/compensation, energy conservation, coping strategies training, and DME and/or AE instructions  RECOMMENDED OTHER SERVICES: PT  CONSULTED AND AGREED WITH PLAN OF CARE: Patient  PLAN FOR NEXT SESSION: continue to work toward goals, UE strength and endurance, Jermie Hippe, OT 03/26/2022, 3:05 PM

## 2022-03-28 ENCOUNTER — Ambulatory Visit: Payer: Medicare (Managed Care)

## 2022-03-28 ENCOUNTER — Ambulatory Visit: Payer: Medicare (Managed Care) | Admitting: Occupational Therapy

## 2022-03-28 DIAGNOSIS — M6281 Muscle weakness (generalized): Secondary | ICD-10-CM

## 2022-03-28 DIAGNOSIS — R278 Other lack of coordination: Secondary | ICD-10-CM

## 2022-03-28 DIAGNOSIS — R296 Repeated falls: Secondary | ICD-10-CM

## 2022-03-28 DIAGNOSIS — R41840 Attention and concentration deficit: Secondary | ICD-10-CM

## 2022-03-28 DIAGNOSIS — R262 Difficulty in walking, not elsewhere classified: Secondary | ICD-10-CM

## 2022-03-28 DIAGNOSIS — R41844 Frontal lobe and executive function deficit: Secondary | ICD-10-CM

## 2022-03-28 NOTE — Patient Instructions (Signed)
Rotate ball in fingertips (clockwise and counter-clockwise). Toss ball between hands. Over a table do not chase if you drop Toss ball in air and catch with the same hand. Over a table do not chase if you drop Flip cards 1 at a time  Deal cards with your thumb (Hold deck in hand and push card off top with thumb). Twirl pen between fingers. Practice writing and/or typing. Open your hand wide by spreading fingers, pick up pennies 1 at a time to stack( 5 per stack),  Pick up your stack of pennies, push them out of your hand 1 at a time into a container Rotate 2 golf balls in your right hand both directions

## 2022-03-28 NOTE — Therapy (Signed)
OUTPATIENT OCCUPATIONAL THERAPY NEURO Treatment  Patient Name: Raymond Christensen MRN: [id number] DOB:[birth year], 82 y.o., male Today's Date: 03/28/2022  PCP:Dr. Nani Ravens REFERRING PROVIDER: Dr. Nani Ravens  END OF SESSION:  OT End of Session - 03/28/22 1115     Visit Number 5    Number of Visits 17    Date for OT Re-Evaluation 05/06/22    Authorization Type generic Medicare    Authorization - Visit Number 5    Progress Note Due on Visit 10    OT Start Time 1100    OT Stop Time [id number]    OT Time Calculation (min) 44 min                 Past Medical History:  Diagnosis Date   Arthritis    CAD (coronary artery disease)    Carotid artery disease ([place])    s/p left [id number]   Chronic systolic CHF (congestive heart failure) ([place])    Concussion Summer 2012   Essential hypertension 02/26/2017   Heart disease    Hyperlipidemia    Hypothyroidism 10/15/2017   Major depressive disorder    Mitral valve regurgitation    Myocardial infarction ([place]) 1996, 2021   Obstructive sleep apnea on CPAP    Persistent atrial fibrillation (HCC)    Poor flexibility of tendon 12/01/2018   S/P mitral valve clip implantation 12/23/2019   s/p TEER with MitraClip with one XTW by Dr. Burt Knack   Subungual hematoma of digit of hand 12/01/2018   Subungual hematoma of right foot 12/01/2018   Thyroid disease    Type 2 diabetes mellitus without complication, without long-term current use of insulin ([place]) 02/26/2017   Past Surgical History:  Procedure Laterality Date   ATRIAL FIBRILLATION ABLATION N/A 02/01/2020   Procedure: ATRIAL FIBRILLATION ABLATION;  Surgeon: [name], MD;  Location: [facility];  Service: Cardiovascular;  Laterality: N/A;   BYPASS GRAFT  2001   CARDIAC CATHETERIZATION     CARDIOVERSION N/A 11/08/2019   Procedure: CARDIOVERSION;  Surgeon: [name], MD;  Location: [facility];  Service: Cardiovascular;  Laterality: N/A;   CAROTID ENDARTERECTOMY Left  01/17/2014   CORONARY ANGIOPLASTY     CORONARY ARTERY BYPASS GRAFT     MITRAL VALVE REPAIR N/A 12/23/2019   Procedure: MITRAL VALVE REPAIR;  Surgeon: [name], MD;  Location: [facility];  Service: Cardiovascular;  Laterality: N/A;   RIGHT/LEFT HEART CATH AND CORONARY/GRAFT ANGIOGRAPHY N/A 09/09/2019   Procedure: RIGHT/LEFT HEART CATH AND CORONARY/GRAFT ANGIOGRAPHY;  Surgeon: [name], MD;  Location: [facility];  Service: Cardiovascular;  Laterality: N/A;   TEE WITHOUT CARDIOVERSION N/A 09/13/2019   Procedure: TRANSESOPHAGEAL ECHOCARDIOGRAM (TEE);  Surgeon: [name], MD;  Location: [facility];  Service: Cardiovascular;  Laterality: N/A;   TEE WITHOUT CARDIOVERSION N/A 12/23/2019   Procedure: TRANSESOPHAGEAL ECHOCARDIOGRAM (TEE);  Surgeon: [name], MD;  Location: [facility];  Service: Cardiovascular;  Laterality: N/A;   TEE WITHOUT CARDIOVERSION  02/01/2020   Procedure: TRANSESOPHAGEAL ECHOCARDIOGRAM (TEE);  Surgeon: [name], MD;  Location: [facility];  Service: Cardiovascular;;   TOTAL HIP ARTHROPLASTY  1994, 1995   Patient Active Problem List   Diagnosis Date Noted   Lipoma of right upper extremity 03/02/2021   Trigger little finger of left hand 09/29/2020   Secondary hypercoagulable state ([place]) 02/29/2020   Depression, major, single episode, moderate (HCC) 02/28/2020   Nonrheumatic tricuspid valve regurgitation    S/P mitral valve  clip implantation 12/23/2019   Mitral valve disease 12/23/2019   Nonrheumatic mitral valve regurgitation    Persistent atrial fibrillation (HCC)    Chronic systolic CHF (congestive heart failure) (HCC)    Acute idiopathic gout involving toe of left foot    Thrombocytopenia ([place]) 09/08/2019   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 ([place])    Diabetes mellitus type 2 in obese ([place]) 06/22/2019   Major depressive disorder    Obstructive  sleep apnea on CPAP    Hypothyroidism 10/15/2017   Subclavian artery stenosis, right ([place]) 06/15/2017   Type 2 diabetes mellitus without complication, without long-term current use of insulin ([place]) 02/26/2017   Severe obesity (BMI 35.0-39.9) with comorbidity ([place]) 02/26/2017   Essential hypertension 02/26/2017   Memory change 01/10/2017   Urge incontinence of urine 01/10/2017   Lightheaded 06/25/2016   AK (actinic keratosis) 01/03/2016   H/O syncope 03/06/2014   Occlusion and stenosis of left carotid artery 01/11/2014   Routine history and physical examination of adult 11/08/2013   History of repair of hip joint 04/20/2013   Hyperlipidemia 04/20/2013   Presence of artificial hip joint 04/20/2013   Vitamin D deficiency 06/18/2010   Concussion 01/24/2010   Elevated prostate specific antigen (PSA) 01/01/2007   Benign prostatic hyperplasia without urinary obstruction 04/22/2006   Osteoarthrosis 04/22/2006   Dyslipidemia 04/02/2006    ONSET DATE: 02/01/22  REFERRING DIAG:  R29.6 (ICD-10-CM) - Recurrent falls  S06.0X0A (ICD-10-CM) - Concussion without loss of consciousness, initial encounter    THERAPY DIAG:  Muscle weakness (generalized)  Other lack of coordination  Attention and concentration deficit  Frontal lobe and executive function deficit  Rationale for Evaluation and Treatment: Rehabilitation  SUBJECTIVE:   SUBJECTIVE STATEMENT: Pt reports no pain Pt accompanied by: self  PERTINENT HISTORY: Past medical history significant for coronary artery disease, type 2 diabetes mellitus, previous MI, chronic systolic CHF, persistent A-fib  Had a fall on 02/01/22- tripped in a grocery store   DIAGNOSTIC FINDINGS:  CT cervical spine:   No evidence of acute fracture or traumatic malalignment.   CT head:   1. No evidence of acute intracranial abnormality. 2. Stable chronic hypodense bilateral subdural collections. 3. Right forehead contusion.     PAIN:    PRECAUTIONS: Fall  WEIGHT BEARING RESTRICTIONS: No  PAIN:  Are you having pain? No  FALLS: Has patient fallen in last 6 months? Yes. Number of falls 2  LIVING ENVIRONMENT: Lives with: lives with their spouse Lives in: House/apartment Stairs: No Has following equipment at home:  built in shower bench  PLOF: Needs assistance with ADLs  PATIENT GOALS: increased ease with LB dressing  OBJECTIVE:   HAND DOMINANCE: Right  ADLs: Overall ADLs: needs assist with LB Transfers/ambulation related to ADLs:falls in grocery store Eating: mod I Grooming: mod I UB Dressing: mod I LB Dressing: needs help with socks Toileting: mod I Bathing: mod I Tub Shower transfers: mod I   IADLs: Shopping: needs assist, fell in grocery store x 2 Light housekeeping: pt's wife is doing more currently Meal Prep: cooks meals mod I Community mobility: falls in walking in grocery Medication management: keeps up with own meds Financial management: pt reports he handles finances Handwriting: 90% legible  MOBILITY STATUS: Hx of falls      UPPER EXTREMITY ROM:  WFLS    UPPER EXTREMITY MMT:     MMT Right eval Left eval  Shoulder flexion 4/5 4+/5  Shoulder abduction  Shoulder adduction    Shoulder extension    Shoulder internal rotation    Shoulder external rotation    Middle trapezius    Lower trapezius    Elbow flexion 4+/5 4+/5  Elbow extension 4+/5 4+/5  Wrist flexion    Wrist extension    Wrist ulnar deviation    Wrist radial deviation    Wrist pronation    Wrist supination    (Blank rows = not tested)  HAND FUNCTION: Grip strength: Right: 53 lbs; Left: 73 lbs  COORDINATION: 9 Hole Peg test: Right: 41.74 sec; Left: 33.53 sec Action tremor present SENSATION: WFL    COGNITION: Overall cognitive status: Impaired recall 2/3 items following short delay, may have delayed processing , cognition to be further addressed within a functional context    VISION: Subjective report: denies visual changes  Baseline vision: Wears glasses all the time   VISION ASSESSMENT: Not tested     OBSERVATIONS: action tremor bilateral UE's   TODAY'S TREATMENT:                                                                                                                              DATE:03/28/22- Arm bike x 8 mins level 3 for conditioning. Therapist upgraded previously issued HEP to blue, 15 reps each bilateral UE's. Pt returned demonstration with min v.c  Therapist reviewed coordination HEP with patient and added stacking coins, manipulating coins and rotating 2 small balls in hand min v.c and demonstration for techniques and managing tremor.       3/12- Reviewed green theraband HEP 10 reps each, bilateral UE's today,  min v.c initially then pt returned demonstration.  Arm bike x 6 min level 3 for conditioning.  Gripper set at level 3 to pick up 1 inch blocks, resistance increased to level 4 to pick up second 1/2 of blocks, min difficulty/ drops, 1 rest break.  Grooved pegboard for increased fine motor coordination and in hand manipulation, min difficulty for in hand manipulation.  Handwriting activity using foam grip, pt printed the alphabet with v.c for positioning, and cues for letter size then pt printed several sentences with good legibility and letter size.      03/19/22- Pt practiced doffing then donning socks using reach, sock aide, min difficulty and increased time and min v.c Pt was provided with information for purchase.   Purdue pegboard for increased RUE fine motor coordination and in hand  manipulation, min difficulty/ v.c  Green theraband exercises 10-15 reps each, min v.c for proximal strengthening.  Memory compensation strategies, reviewed and handout issued     PATIENT EDUCATION Blue theraband HEP, coordination HEP with updates, see pt instructions. Person educated: Patient Education method: Explanation,  Demonstration, Verbal cues, handout Education comprehension: verbalized understanding, returned demonstration, and verbal cues required   HOME EXERCISE PROGRAM: N/A   GOALS: Goals reviewed with patient? Yes  SHORT TERM GOALS: Target date: 04/08/22  I with HEP for coordination and  grip strength Baseline: Goal status met HEP issued 03/14/22  2.  Pt will verbalize understanding of compensatory strategies for short term memory deficits. Baseline: recalls 2/3 items following short delay Goal status: met, pt verbalizes understanding. 03/26/22  3.  Pt will increase RUE grip strength by 5 lbs for increased functional use Baseline: Grip strength: Right: 53 lbs; Left: 73 lbs Goal status:ongoing- HEP issued 03/14/22   LONG TERM GOALS: Target date: 05/06/22  I with HEP for proximal strength Baseline:  Goal status: ongoing, issued, may benefit from updates 03/26/22  2.  Pt will demonstrate improved fine motor coordination for ADLS as evidenced by decreasing 9 hole peg test by 3 secs for RUE. Baseline: 9 Hole Peg test: Right: 41.74 sec; Left: 33.53 sec Goal status:  ongoing 39.35,33.73  3.  Pt will donn socks mod I. Baseline: needs assist Goal status: met, pt purchased a sock aide and he is now donning his own socks, 03/26/22 4.  Pt will resume prior level of home management activities demonstrating good safety awareness. Baseline: pt's wife is currently performing. Goal status: INITIAL    ASSESSMENT:  CLINICAL IMPRESSION: Pt is progressing towards goals with improving UE strength and coordination.  After review of coordination HEP, pt reports concerns regarding RUE coordination and pt plans to schedule a few more OT visits.  PERFORMANCE DEFICITS: in functional skills including ADLs, IADLs, coordination, dexterity, tone, ROM, strength, pain, flexibility, Fine motor control, Gross motor control, mobility, balance, endurance, decreased knowledge of precautions, decreased knowledge of use of  DME, vision, and UE functional use, cognitive skills including attention, memory, problem solving, and thought, and psychosocial skills including coping strategies, environmental adaptation, habits, interpersonal interactions, and routines and behaviors.   IMPAIRMENTS: are limiting patient from ADLs, IADLs, play, leisure, and social participation.   CO-MORBIDITIES: may have co-morbidities  that affects occupational performance. Patient will benefit from skilled OT to address above impairments and improve overall function.  MODIFICATION OR ASSISTANCE TO COMPLETE EVALUATION: No modification of tasks or assist necessary to complete an evaluation.  OT OCCUPATIONAL PROFILE AND HISTORY: Problem focused assessment: Including review of records relating to presenting problem.  CLINICAL DECISION MAKING: LOW - limited treatment options, no task modification necessary  REHAB POTENTIAL: Good  EVALUATION COMPLEXITY: Low    PLAN:  OT FREQUENCY:2x/ week - resent to update frequency  OT DURATION: 8 weeks plus eval  PLANNED INTERVENTIONS: self care/ADL training, therapeutic exercise, therapeutic activity, neuromuscular re-education, manual therapy, passive range of motion, balance training, functional mobility training, moist heat, patient/family education, cognitive remediation/compensation, energy conservation, coping strategies training, and DME and/or AE instructions  RECOMMENDED OTHER SERVICES: PT  CONSULTED AND AGREED WITH PLAN OF CARE: Patient  PLAN FOR NEXT SESSION: continue to work toward goals, fine motor coordination, UE strength and endurance, Bryauna Byrum, OT 03/28/2022, 11:21 AM

## 2022-03-31 ENCOUNTER — Other Ambulatory Visit: Payer: Self-pay | Admitting: Family Medicine

## 2022-03-31 DIAGNOSIS — E1169 Type 2 diabetes mellitus with other specified complication: Secondary | ICD-10-CM

## 2022-04-02 ENCOUNTER — Encounter: Payer: Self-pay | Admitting: Physical Therapy

## 2022-04-02 ENCOUNTER — Encounter: Payer: Self-pay | Admitting: Occupational Therapy

## 2022-04-02 ENCOUNTER — Ambulatory Visit: Payer: Medicare (Managed Care) | Admitting: Occupational Therapy

## 2022-04-02 ENCOUNTER — Ambulatory Visit: Payer: Medicare (Managed Care) | Admitting: Physical Therapy

## 2022-04-02 DIAGNOSIS — R41844 Frontal lobe and executive function deficit: Secondary | ICD-10-CM

## 2022-04-02 DIAGNOSIS — R278 Other lack of coordination: Secondary | ICD-10-CM

## 2022-04-02 DIAGNOSIS — M6281 Muscle weakness (generalized): Secondary | ICD-10-CM

## 2022-04-02 DIAGNOSIS — Z9181 History of falling: Secondary | ICD-10-CM

## 2022-04-02 DIAGNOSIS — R41840 Attention and concentration deficit: Secondary | ICD-10-CM

## 2022-04-02 DIAGNOSIS — R296 Repeated falls: Secondary | ICD-10-CM

## 2022-04-02 NOTE — Therapy (Signed)
OUTPATIENT PHYSICAL THERAPY LOWER EXTREMITY TREATMENT  Patient Name: Raymond Christensen MRN: [id number] DOB:1941/01/10, 82 y.o., male Today's Date: 04/02/2022  END OF SESSION:  PT End of Session - 04/02/22 1059     Visit Number 12    Date for PT Re-Evaluation 05/09/22    PT Start Time 1015    PT Stop Time 1100    PT Time Calculation (min) 45 min    Activity Tolerance Patient tolerated treatment well    Behavior During Therapy [facility] for tasks assessed/performed                 Past Medical History:  Diagnosis Date   Arthritis    CAD (coronary artery disease)    Carotid artery disease ([place])    s/p left CEA [id number]   Chronic systolic CHF (congestive heart failure) ([place])    Concussion Summer 2012   Essential hypertension 02/26/2017   Heart disease    Hyperlipidemia    Hypothyroidism 10/15/2017   Major depressive disorder    Mitral valve regurgitation    Myocardial infarction ([place]) 1996, 2021   Obstructive sleep apnea on CPAP    Persistent atrial fibrillation (HCC)    Poor flexibility of tendon 12/01/2018   S/P mitral valve clip implantation 12/23/2019   s/p TEER with MitraClip with one XTW by Dr. Burt Knack   Subungual hematoma of digit of hand 12/01/2018   Subungual hematoma of right foot 12/01/2018   Thyroid disease    Type 2 diabetes mellitus without complication, without long-term current use of insulin ([place]) 02/26/2017   Past Surgical History:  Procedure Laterality Date   ATRIAL FIBRILLATION ABLATION N/A 02/01/2020   Procedure: ATRIAL FIBRILLATION ABLATION;  Surgeon: [name], MD;  Location: [place] [facility];  Service: Cardiovascular;  Laterality: N/A;   BYPASS GRAFT  2001   CARDIAC CATHETERIZATION     CARDIOVERSION N/A 11/08/2019   Procedure: CARDIOVERSION;  Surgeon: [name], MD;  Location: [place];  Service: Cardiovascular;  Laterality: N/A;   CAROTID ENDARTERECTOMY Left 01/17/2014   CORONARY ANGIOPLASTY     CORONARY ARTERY BYPASS GRAFT      MITRAL VALVE REPAIR N/A 12/23/2019   Procedure: MITRAL VALVE REPAIR;  Surgeon: [name], MD;  Location: [place] [facility];  Service: Cardiovascular;  Laterality: N/A;   RIGHT/LEFT HEART CATH AND CORONARY/GRAFT ANGIOGRAPHY N/A 09/09/2019   Procedure: RIGHT/LEFT HEART CATH AND CORONARY/GRAFT ANGIOGRAPHY;  Surgeon: [name], MD;  Location: [place] [facility];  Service: Cardiovascular;  Laterality: N/A;   TEE WITHOUT CARDIOVERSION N/A 09/13/2019   Procedure: TRANSESOPHAGEAL ECHOCARDIOGRAM (TEE);  Surgeon: [name], MD;  Location: [facility] ENDOS[facility];  Service: Cardiovascular;  Laterality: N/A;   TEE WITHOUT CARDIOVERSION N/A 12/23/2019   Procedure: TRANSESOPHAGEAL ECHOCARDIOGRAM (TEE);  Surgeon: [name], MD;  Location: [place] [facility];  Service: Cardiovascular;  Laterality: N/A;   TEE WITHOUT CARDIOVERSION  02/01/2020   Procedure: TRANSESOPHAGEAL ECHOCARDIOGRAM (TEE);  Surgeon: [name], MD;  Location: [place] [facility];  Service: Cardiovascular;;   TOTAL HIP ARTHROPLASTY  1994, 1995   Patient Active Problem List   Diagnosis Date Noted   Lipoma of right upper extremity 03/02/2021   Trigger little finger of left hand 09/29/2020   Secondary hypercoagulable state ([place]) 02/29/2020   Depression, major, single episode, moderate (HCC) 02/28/2020   Nonrheumatic tricuspid valve regurgitation    S/P mitral valve clip implantation 12/23/2019   Mitral valve disease 12/23/2019   Nonrheumatic mitral valve regurgitation  Persistent atrial fibrillation (HCC)    Chronic systolic CHF (congestive heart failure) (HCC)    Acute idiopathic gout involving toe of left foot    Thrombocytopenia ([place]) 09/08/2019   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 ([place])    Diabetes mellitus type 2 in obese ([place]) 06/22/2019   Major depressive disorder    Obstructive sleep apnea on CPAP    Hypothyroidism 10/15/2017   Subclavian artery  stenosis, right ([place]) 06/15/2017   Type 2 diabetes mellitus without complication, without long-term current use of insulin ([place]) 02/26/2017   Severe obesity (BMI 35.0-39.9) with comorbidity ([place]) 02/26/2017   Essential hypertension 02/26/2017   Memory change 01/10/2017   Urge incontinence of urine 01/10/2017   Lightheaded 06/25/2016   AK (actinic keratosis) 01/03/2016   H/O syncope 03/06/2014   Occlusion and stenosis of left carotid artery 01/11/2014   Routine history and physical examination of adult 11/08/2013   History of repair of hip joint 04/20/2013   Hyperlipidemia 04/20/2013   Presence of artificial hip joint 04/20/2013   Vitamin D deficiency 06/18/2010   Concussion 01/24/2010   Elevated prostate specific antigen (PSA) 01/01/2007   Benign prostatic hyperplasia without urinary obstruction 04/22/2006   Osteoarthrosis 04/22/2006   Dyslipidemia 04/02/2006    PCP: Riki Sheer  REFERRING PROVIDER: Riki Sheer  REFERRING DIAG: M53.3- Sacroiliac dysfunction  THERAPY DIAG:  Muscle weakness (generalized)  Repeated falls  Other lack of coordination  History of falling  Rationale for Evaluation and Treatment: Rehabilitation  ONSET DATE: 12/26/21  SUBJECTIVE:   SUBJECTIVE STATEMENT: "Fairly well"  PERTINENT HISTORY: Past medical history significant for coronary artery disease, type 2 diabetes mellitus, previous MI, chronic systolic CHF, persistent A-fib  Had a fall on 02/01/22- tripped in a grocery store  PAIN:  Are you having pain? Yes: NPRS scale: 0/10 Pain location: head, shoulder, hip Pain description: sharp if I press on it other than that it is just dull Aggravating factors: nothing specifically Relieving factors: medicine  PRECAUTIONS: None  WEIGHT BEARING RESTRICTIONS: No  FALLS:  Has patient fallen in last 6 months? Yes. Number of falls 2  LIVING ENVIRONMENT: Lives with: lives with their spouse Lives in: House/apartment Stairs: No Has  following equipment at home: Environmental consultant - 2 wheeled  OCCUPATION: Retired  PLOF: Independent  PATIENT GOALS: I want to make sure I can become ambulatory again and get around without the walker  NEXT MD VISIT:   OBJECTIVE:   DIAGNOSTIC FINDINGS:  CT cervical spine:   No evidence of acute fracture or traumatic malalignment.  CT head:   1. No evidence of acute intracranial abnormality. 2. Stable chronic hypodense bilateral subdural collections. 3. Right forehead contusion.  IMPRESSION: Negative.  COGNITION: Overall cognitive status: Within functional limits for tasks assessed, some delay in response and wife reports some memory deficits    SENSATION: WFL   MUSCLE LENGTH: Hamstrings:bilateral tightness, more in LLE  POSTURE: rounded shoulders, forward head, and flexed trunk   PALPATION: Tightness in bilateral UT, more in R side LOWER EXTREMITY ROM: grossly WFL some pain with R side hip flexion    LOWER EXTREMITY MMT: grossly 4/5    FUNCTIONAL TESTS:  5 times sit to stand: 21.31s with UE use Timed up and go (TUG): 22.27s  GAIT: Distance walked: in clinic distances Assistive device utilized: Environmental consultant - 2 wheeled Level of assistance: Modified independence Comments: shuffling feet, decreased stance time bilaterally, decrease stride length, poor foot clearance   TODAY'S TREATMENT:  DATE:  04/02/22 Bike L3 x 6 min NuStep L4 x 4 min Side step and tandem walking on balance bean in // bars Side step on balance beam over object in // bars  Side step on and off airex  Passive progressive HS stetch  03/28/22 NuStep L5 x77mins Passive stretching of HS, piriformis, glutes, IT band  Knees to chest 30s  Trunk rotations 2x10 Bridges 2x10  SLS at counter 20s  Tandem standing at counter STS on airex Standing on airex cone taps 20 reps CGA req Standing  on airex shoulder ext 10# 2x10    03/26/22 Reassess goals- TUG and 5xSTS BERG 46/56 Walking outdoors around [employer] NuStep L5 x28mins  HS stretch seated 30s  Knees to chest and piriformis stretch supine 30s Marching on airex Side steps on airex Walking on beam   03/21/22 NuStep L5 x 6 min Resisted side step over objets 20lb x5 each On airex ball toss  S2S OHP on airex 2x10 6in step ups from airex 2x5 each Side step on an off airex  Calf stretch with black bar  35lb hamstring curls 2x10 Leg Ext 10lb 2x10 Passive Hs and piriformis stretch.    03/19/22 NuStep L5 x 6 min Resisted gait 30lb 4 way x 4 each Shoulder Ext for core 10lb 2x10  On airex alt 6in box taps x10 S2S on airex 2x10 Alternate taps on cone on the floor x 10, mildly unsteady.  Side steps and tandem waling on balance beam in // bars Leg press 40# 3x12 HS self stretch   03/14/22 NuStep L5 x 4 minutes B side stepping onto and off Air Ex pad x 10 each direction. No UE support, S for balance. Step back with RLE, rotating to R to place object on mat behind, then return forward. % x to each side, no difficulty with balance. Alternate taps on cone on the floor x 10, mildly unsteady.  Progressed to multiple taps with each foot, mod difficulty occasional light min A for balance. Floor ladder- walking with one foot in each square x 4, side stepping into each square x 2, changing direction upon therapist command x 1 Walking on floor mat, forward, back, side stepping, turning, slow walking, CGA, mildly unsteady, but no LOB.   03/11/22 NuStep L5 x 6 minutes. Trunk mobilization-seated, rotate to L, walk hands out and back x 4, repeat to R Seated HS stretch, 3 x 15 sec each leg Supine stretch with strap, Abd, Add, figure 4, ITB, LTR, 3 x 15 sec each. Balance/mobility training in the parallel bars-crossover and back with trunk rotation, minimize UE support, but required at least UUE. Quick steps side to side x 30  seconds Ambulation with quick changes of direction, forward, back, side to side, increased speed, approximately 200'   03/07/22 Bike L3 x23mins  2 way hip 3# 2x10 Marching on airex in // bars  Side steps on beam in // bars Narrow walking on beam in // bars Shoulder ext 10# 2x10 STS with OHP yellow 2x10 Leg press 40# 2x10 Calf raises 2x10   PATIENT EDUCATION:  Education details: POC and HEP Person educated: Patient and Spouse Education method: Explanation Education comprehension: verbalized understanding  HOME EXERCISE PROGRAM:  Access Code: [email or web address] URL: [email or web address] Date: 02/14/2022 Prepared by: Andris Baumann  Exercises - Sit to Stand with Arms Crossed  - 1 x daily - 7 x weekly - 2 sets - 10 reps - Standing March with Counter Support  - 1 x daily -  7 x weekly - 2 sets - 10 reps - Standing Hip Abduction with Unilateral Counter Support  - 1 x daily - 7 x weekly - 2 sets - 10 reps - Standing with Head Rotation  - 1 x daily - 7 x weekly - 2 sets - 5 reps - 20 hold - Romberg Stance with Head Nods  - 1 x daily - 7 x weekly - 2 sets - 5 reps - 20 hold  ASSESSMENT:  CLINICAL IMPRESSION: Patient doing well overall, no reports of falls or stumbles. He demonstrates some instability with tandem walking ans side step over object. CG needed with side step on and off airex. Continued to work on stretching as he is still very tight especially in hips and hamstrings.   OBJECTIVE IMPAIRMENTS: Abnormal gait, decreased balance, decreased mobility, difficulty walking, decreased strength, and pain.   ACTIVITY LIMITATIONS: bending, stairs, transfers, bed mobility, and locomotion level  PARTICIPATION LIMITATIONS: driving, shopping, and yard work  [employer] POTENTIAL: Good  CLINICAL DECISION MAKING: Stable/uncomplicated  EVALUATION COMPLEXITY: Low   GOALS: Goals reviewed with patient? Yes  SHORT TERM GOALS: Target date: 03/28/22  Patient will be independent with  initial HEP. Goal status: met  2.  Patient will demonstrate decreased fall risk by scoring < 15 sec on TUG. Baseline: 22.27s, 9.78s Goal status: MET  3.  Patient will be educated on strategies to decrease risk of falls.  Goal status: MET   LONG TERM GOALS: Target date: 05/09/22  Patient will be independent with advanced/ongoing HEP to improve outcomes and carryover.  Goal status: IN PROGRESS  2.  Patient will be able to ambulate 600' with LRAD with good safety to access community.  Baseline: using RW after fall Goal status: MET   3.  Patient will demonstrate improved functional LE strength as demonstrated by decreased 5xSTS to <14s. Baseline: 21.21s, 11.80s Goal status: MET   4.  Patient will score 46 on Berg Balance test to demonstrate lower risk of falls. (MCID= 8 points) .  Baseline: 38, 46 Goal status: MET  5. Patient will demonstrate increased flexibility by being able to touch ankles   Baseline: mid shin  Goal status: INITIAL  6. Patient will demonstrate SLS at least 3 seconds or more bilaterally   Baseline: unable to do 03/26/22 >10s BLE 03/28/22  Goal status: MET   PLAN:  PT FREQUENCY: 1-2x/week  PT DURATION: 12 weeks  PLANNED INTERVENTIONS: Therapeutic exercises, Therapeutic activity, Neuromuscular re-education, Balance training, Gait training, Patient/Family education, Self Care, Joint mobilization, Stair training, Dry Needling, Electrical stimulation, Cryotherapy, Moist heat, Vasopneumatic device, Ionotophoresis 4mg /ml Dexamethasone, and Manual therapy  PLAN FOR NEXT SESSION: work on strength and balance and stretch!!  Andris Baumann, DPT 04/02/22 11:03 AM

## 2022-04-02 NOTE — Therapy (Signed)
OUTPATIENT OCCUPATIONAL THERAPY NEURO Treatment  Patient Name: Raymond Christensen MRN: [id number] DOB:April 05, 1940, 82 y.o., male Today's Date: 04/02/2022  PCP:Dr. Nani Ravens REFERRING PROVIDER: Dr. Nani Ravens  END OF SESSION:  OT End of Session - 04/02/22 1012     Visit Number 6    Number of Visits 17    Date for OT Re-Evaluation 05/06/22    Authorization Type generic Medicare    Authorization - Visit Number 6    Progress Note Due on Visit 10    OT Start Time 1015    OT Stop Time 1100    OT Time Calculation (min) 45 min                  Past Medical History:  Diagnosis Date   Arthritis    CAD (coronary artery disease)    Carotid artery disease ([place])    s/p left CEA [id number]   Chronic systolic CHF (congestive heart failure) ([place])    Concussion Summer 2012   Essential hypertension 02/26/2017   Heart disease    Hyperlipidemia    Hypothyroidism 10/15/2017   Major depressive disorder    Mitral valve regurgitation    Myocardial infarction ([place]) 1996, 2021   Obstructive sleep apnea on CPAP    Persistent atrial fibrillation (HCC)    Poor flexibility of tendon 12/01/2018   S/P mitral valve clip implantation 12/23/2019   s/p TEER with MitraClip with one XTW by Dr. Burt Knack   Subungual hematoma of digit of hand 12/01/2018   Subungual hematoma of right foot 12/01/2018   Thyroid disease    Type 2 diabetes mellitus without complication, without long-term current use of insulin ([place]) 02/26/2017   Past Surgical History:  Procedure Laterality Date   ATRIAL FIBRILLATION ABLATION N/A 02/01/2020   Procedure: ATRIAL FIBRILLATION ABLATION;  Surgeon: [name], MD;  Location: [facility];  Service: Cardiovascular;  Laterality: N/A;   BYPASS GRAFT  2001   CARDIAC CATHETERIZATION     CARDIOVERSION N/A 11/08/2019   Procedure: CARDIOVERSION;  Surgeon: [name], MD;  Location: [facility];  Service: Cardiovascular;  Laterality: N/A;   CAROTID ENDARTERECTOMY Left  01/17/2014   CORONARY ANGIOPLASTY     CORONARY ARTERY BYPASS GRAFT     MITRAL VALVE REPAIR N/A 12/23/2019   Procedure: MITRAL VALVE REPAIR;  Surgeon: [name], MD;  Location: [facility];  Service: Cardiovascular;  Laterality: N/A;   RIGHT/LEFT HEART CATH AND CORONARY/GRAFT ANGIOGRAPHY N/A 09/09/2019   Procedure: RIGHT/LEFT HEART CATH AND CORONARY/GRAFT ANGIOGRAPHY;  Surgeon: [name], MD;  Location: [facility];  Service: Cardiovascular;  Laterality: N/A;   TEE WITHOUT CARDIOVERSION N/A 09/13/2019   Procedure: TRANSESOPHAGEAL ECHOCARDIOGRAM (TEE);  Surgeon: [name], MD;  Location: [facility];  Service: Cardiovascular;  Laterality: N/A;   TEE WITHOUT CARDIOVERSION N/A 12/23/2019   Procedure: TRANSESOPHAGEAL ECHOCARDIOGRAM (TEE);  Surgeon: [name], MD;  Location: [facility];  Service: Cardiovascular;  Laterality: N/A;   TEE WITHOUT CARDIOVERSION  02/01/2020   Procedure: TRANSESOPHAGEAL ECHOCARDIOGRAM (TEE);  Surgeon: [name], MD;  Location: [facility];  Service: Cardiovascular;;   TOTAL HIP ARTHROPLASTY  1994, 1995   Patient Active Problem List   Diagnosis Date Noted   Lipoma of right upper extremity 03/02/2021   Trigger little finger of left hand 09/29/2020   Secondary hypercoagulable state ([place]) 02/29/2020   Depression, major, single episode, moderate (HCC) 02/28/2020   Nonrheumatic tricuspid valve regurgitation    S/P mitral  valve clip implantation 12/23/2019   Mitral valve disease 12/23/2019   Nonrheumatic mitral valve regurgitation    Persistent atrial fibrillation (HCC)    Chronic systolic CHF (congestive heart failure) (HCC)    Acute idiopathic gout involving toe of left foot    Thrombocytopenia ([place]) 09/08/2019   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 ([place])    Diabetes mellitus type 2 in obese ([place]) 06/22/2019   Major depressive disorder    Obstructive  sleep apnea on CPAP    Hypothyroidism 10/15/2017   Subclavian artery stenosis, right ([place]) 06/15/2017   Type 2 diabetes mellitus without complication, without long-term current use of insulin ([place]) 02/26/2017   Severe obesity (BMI 35.0-39.9) with comorbidity ([place]) 02/26/2017   Essential hypertension 02/26/2017   Memory change 01/10/2017   Urge incontinence of urine 01/10/2017   Lightheaded 06/25/2016   AK (actinic keratosis) 01/03/2016   H/O syncope 03/06/2014   Occlusion and stenosis of left carotid artery 01/11/2014   Routine history and physical examination of adult 11/08/2013   History of repair of hip joint 04/20/2013   Hyperlipidemia 04/20/2013   Presence of artificial hip joint 04/20/2013   Vitamin D deficiency 06/18/2010   Concussion 01/24/2010   Elevated prostate specific antigen (PSA) 01/01/2007   Benign prostatic hyperplasia without urinary obstruction 04/22/2006   Osteoarthrosis 04/22/2006   Dyslipidemia 04/02/2006    ONSET DATE: 02/01/22  REFERRING DIAG:  R29.6 (ICD-10-CM) - Recurrent falls  S06.0X0A (ICD-10-CM) - Concussion without loss of consciousness, initial encounter    THERAPY DIAG:  Muscle weakness (generalized)  Other lack of coordination  Attention and concentration deficit  Frontal lobe and executive function deficit  Rationale for Evaluation and Treatment: Rehabilitation  SUBJECTIVE:   SUBJECTIVE STATEMENT:    Pt accompanied by: self  PERTINENT HISTORY: Past medical history significant for coronary artery disease, type 2 diabetes mellitus, previous MI, chronic systolic CHF, persistent A-fib  Had a fall on 02/01/22- tripped in a grocery store   DIAGNOSTIC FINDINGS:  CT cervical spine:   No evidence of acute fracture or traumatic malalignment.   CT head:   1. No evidence of acute intracranial abnormality. 2. Stable chronic hypodense bilateral subdural collections. 3. Right forehead contusion.     PAIN: PAIN:  Are you having pain?  Yes: NPRS scale: 3/10 Pain location: right shoulder  Pain description: sharp Aggravating factors: malpositioning Relieving factors: rest     PRECAUTIONS: Fall  WEIGHT BEARING RESTRICTIONS: No  PAIN:  Are you having pain? No  FALLS: Has patient fallen in last 6 months? Yes. Number of falls 2  LIVING ENVIRONMENT: Lives with: lives with their spouse Lives in: House/apartment Stairs: No Has following equipment at home:  built in shower bench  PLOF: Needs assistance with ADLs  PATIENT GOALS: increased ease with LB dressing  OBJECTIVE:   HAND DOMINANCE: Right  ADLs: Overall ADLs: needs assist with LB Transfers/ambulation related to ADLs:falls in grocery store Eating: mod I Grooming: mod I UB Dressing: mod I LB Dressing: needs help with socks Toileting: mod I Bathing: mod I Tub Shower transfers: mod I   IADLs: Shopping: needs assist, fell in grocery store x 2 Light housekeeping: pt's wife is doing more currently Meal Prep: cooks meals mod I Community mobility: falls in walking in grocery Medication management: keeps up with own meds Financial management: pt reports he handles finances Handwriting: 90% legible  MOBILITY STATUS: Hx of falls      UPPER EXTREMITY ROM:  WFLS  UPPER EXTREMITY MMT:     MMT Right eval Left eval  Shoulder flexion 4/5 4+/5  Shoulder abduction    Shoulder adduction    Shoulder extension    Shoulder internal rotation    Shoulder external rotation    Middle trapezius    Lower trapezius    Elbow flexion 4+/5 4+/5  Elbow extension 4+/5 4+/5  Wrist flexion    Wrist extension    Wrist ulnar deviation    Wrist radial deviation    Wrist pronation    Wrist supination    (Blank rows = not tested)  HAND FUNCTION: Grip strength: Right: 53 lbs; Left: 73 lbs  COORDINATION: 9 Hole Peg test: Right: 41.74 sec; Left: 33.53 sec Action tremor present SENSATION: WFL    COGNITION: Overall cognitive status: Impaired recall 2/3  items following short delay, may have delayed processing , cognition to be further addressed within a functional context   VISION: Subjective report: denies visual changes  Baseline vision: Wears glasses all the time   VISION ASSESSMENT: Not tested     OBSERVATIONS: action tremor bilateral UE's   TODAY'S TREATMENT:                                                                                                                              DATE:04/02/22 Arm bike x 8 mins level 4 for conditioning. Pt reports L shoulder pain today. Therapist downgraded pt's theraband to green. Pt reviewed all exercises, shoulder flexion and abduction with green theraband were discontinued for the time being as they increase pt discomfort. Pt performed gentle closed chain shoulder flexion and with no increased pain.  Fine motor coordination activities: Copying small peg design with RUE, then removing with in hand manipulation, min v.c Rotating a single ball in hand then tossing between hands and then in each hand, min difficulty/ v.c  Gripper set at level 4 to pick up inch blocks for sustained grip, min difficulty/ drops, 1 rest break for 1/2 blocks.  Handwriting activity using foam grip, pt demonstrates improved legibility and letter size.     03/28/22- Arm bike x 8 mins level 3 for conditioning. Therapist upgraded previously issued HEP to blue, 15 reps each bilateral UE's. Pt returned demonstration with min v.c  Therapist reviewed coordination HEP with patient and added stacking coins, manipulating coins and rotating 2 small balls in hand min v.c and demonstration for techniques and managing tremor.  3/12- Reviewed green theraband HEP 10 reps each, bilateral UE's today,  min v.c initially then pt returned demonstration.  Arm bike x 6 min level 3 for conditioning.  Gripper set at level 3 to pick up 1 inch blocks, resistance increased to level 4 to pick up second 1/2 of blocks, min difficulty/  drops, 1 rest break.  Grooved pegboard for increased fine motor coordination and in hand manipulation, min difficulty for in hand manipulation.  Handwriting activity using foam grip, pt printed the alphabet with v.c  for positioning, and cues for letter size then pt printed several sentences with good legibility and letter size.      03/19/22- Pt practiced doffing then donning socks using reach, sock aide, min difficulty and increased time and min v.c Pt was provided with information for purchase.   Purdue pegboard for increased RUE fine motor coordination and in hand  manipulation, min difficulty/ v.c  Green theraband exercises 10-15 reps each, min v.c for proximal strengthening.  Memory compensation strategies, reviewed and handout issued     PATIENT EDUCATION 04/02/22- green  theraband HEP, shoulder abduction and flexion temporarily d/c due to pain. Person educated: Patient Education method: Explanation, Demonstration, Verbal cues, handout Education comprehension: verbalized understanding, returned demonstration, and verbal cues required   HOME EXERCISE PROGRAM: N/A   GOALS: Goals reviewed with patient? Yes  SHORT TERM GOALS: Target date: 04/08/22  I with HEP for coordination and grip strength Baseline: Goal status met HEP issued 03/14/22  2.  Pt will verbalize understanding of compensatory strategies for short term memory deficits. Baseline: recalls 2/3 items following short delay Goal status: met, pt verbalizes understanding. 03/26/22  3.  Pt will increase RUE grip strength by 5 lbs for increased functional use Baseline: Grip strength: Right: 53 lbs; Left: 73 lbs Goal status:ongoing- HEP issued 03/14/22   LONG TERM GOALS: Target date: 05/06/22  I with HEP for proximal strength Baseline:  Goal status: ongoing, issued, may benefit from updates 03/26/22  2.  Pt will demonstrate improved fine motor coordination for ADLS as evidenced by decreasing 9 hole peg test by 3 secs  for RUE. Baseline: 9 Hole Peg test: Right: 41.74 sec; Left: 33.53 sec Goal status:  ongoing 39.35,33.73  3.  Pt will donn socks mod I. Baseline: needs assist Goal status: met, pt purchased a sock aide and he is now donning his own socks, 03/26/22 4.  Pt will resume prior level of home management activities demonstrating good safety awareness. Baseline: pt's wife is currently performing. Goal status: INITIAL    ASSESSMENT:  CLINICAL IMPRESSION: Pt is progressing towards goals with improving fine motor coordination. Pt reports new shoulder pain today. Therefore pt's theraband exercises were modified to green and several were temporarily d/c due to pain.  PERFORMANCE DEFICITS: in functional skills including ADLs, IADLs, coordination, dexterity, tone, ROM, strength, pain, flexibility, Fine motor control, Gross motor control, mobility, balance, endurance, decreased knowledge of precautions, decreased knowledge of use of DME, vision, and UE functional use, cognitive skills including attention, memory, problem solving, and thought, and psychosocial skills including coping strategies, environmental adaptation, habits, interpersonal interactions, and routines and behaviors.   IMPAIRMENTS: are limiting patient from ADLs, IADLs, play, leisure, and social participation.   CO-MORBIDITIES: may have co-morbidities  that affects occupational performance. Patient will benefit from skilled OT to address above impairments and improve overall function.  MODIFICATION OR ASSISTANCE TO COMPLETE EVALUATION: No modification of tasks or assist necessary to complete an evaluation.  OT OCCUPATIONAL PROFILE AND HISTORY: Problem focused assessment: Including review of records relating to presenting problem.  CLINICAL DECISION MAKING: LOW - limited treatment options, no task modification necessary  REHAB POTENTIAL: Good  EVALUATION COMPLEXITY: Low    PLAN:  OT FREQUENCY:2x/ week - resent to update  frequency  OT DURATION: 8 weeks plus eval  PLANNED INTERVENTIONS: self care/ADL training, therapeutic exercise, therapeutic activity, neuromuscular re-education, manual therapy, passive range of motion, balance training, functional mobility training, moist heat, patient/family education, cognitive remediation/compensation, energy conservation, coping strategies training, and DME and/or  AE instructions  RECOMMENDED OTHER SERVICES: PT  CONSULTED AND AGREED WITH PLAN OF CARE: Patient  PLAN FOR NEXT SESSION: monitor shoulder pain, continue to work toward goals, fine motor coordination, UE strength and endurance, Kahlea Cobert, OT 04/02/2022, 10:12 AM

## 2022-04-04 ENCOUNTER — Ambulatory Visit: Payer: Medicare (Managed Care) | Admitting: Occupational Therapy

## 2022-04-04 ENCOUNTER — Encounter: Payer: Self-pay | Admitting: Physical Therapy

## 2022-04-04 ENCOUNTER — Encounter: Payer: Self-pay | Admitting: Occupational Therapy

## 2022-04-04 ENCOUNTER — Ambulatory Visit: Payer: Medicare (Managed Care) | Admitting: Physical Therapy

## 2022-04-04 DIAGNOSIS — R278 Other lack of coordination: Secondary | ICD-10-CM

## 2022-04-04 DIAGNOSIS — R2689 Other abnormalities of gait and mobility: Secondary | ICD-10-CM

## 2022-04-04 DIAGNOSIS — R41840 Attention and concentration deficit: Secondary | ICD-10-CM

## 2022-04-04 DIAGNOSIS — M6281 Muscle weakness (generalized): Secondary | ICD-10-CM | POA: Diagnosis not present

## 2022-04-04 DIAGNOSIS — Z9181 History of falling: Secondary | ICD-10-CM

## 2022-04-04 DIAGNOSIS — R296 Repeated falls: Secondary | ICD-10-CM

## 2022-04-04 DIAGNOSIS — R41844 Frontal lobe and executive function deficit: Secondary | ICD-10-CM

## 2022-04-04 NOTE — Therapy (Signed)
OUTPATIENT OCCUPATIONAL THERAPY NEURO Treatment  Patient Name: Raymond Christensen MRN: [id number] DOB:1940/04/26, 82 y.o., male Today's Date: 04/04/2022  PCP:Dr. Nani Ravens REFERRING PROVIDER: Dr. Nani Ravens  END OF SESSION:  OT End of Session - 04/04/22 1053     Visit Number 7    Number of Visits 17    Date for OT Re-Evaluation 05/06/22    Authorization - Visit Number 7    Progress Note Due on Visit 10    OT Start Time 1055    OT Stop Time 1140    OT Time Calculation (min) 45 min    Activity Tolerance Patient tolerated treatment well    Behavior During Therapy [facility] for tasks assessed/performed                  Past Medical History:  Diagnosis Date   Arthritis    CAD (coronary artery disease)    Carotid artery disease ([place])    s/p left CEA [id number]   Chronic systolic CHF (congestive heart failure) ([place])    Concussion Summer 2012   Essential hypertension 02/26/2017   Heart disease    Hyperlipidemia    Hypothyroidism 10/15/2017   Major depressive disorder    Mitral valve regurgitation    Myocardial infarction ([place]) 1996, 2021   Obstructive sleep apnea on CPAP    Persistent atrial fibrillation (H[facility])    Poor flexibility of tendon 12/01/2018   S/P mitral valve clip implantation 12/23/2019   s/p TEER with MitraClip with one XTW by Dr. Burt Knack   Subungual hematoma of digit of hand 12/01/2018   Subungual hematoma of right foot 12/01/2018   Thyroid disease    Type 2 diabetes mellitus without complication, without long-term current use of insulin ([place]) 02/26/2017   Past Surgical History:  Procedure Laterality Date   ATRIAL FIBRILLATION ABLATION N/A 02/01/2020   Procedure: ATRIAL FIBRILLATION ABLATION;  Surgeon: [name], MD;  Location: [place] [facility];  Service: Cardiovascular;  Laterality: N/A;   BYPASS GRAFT  2001   CARDIAC CATHETERIZATION     CARDIOVERSION N/A 11/08/2019   Procedure: CARDIOVERSION;  Surgeon: [name], MD;  Location: [place];   Service: Cardiovascular;  Laterality: N/A;   CAROTID ENDARTERECTOMY Left 01/17/2014   CORONARY ANGIOPLASTY     CORONARY ARTERY BYPASS GRAFT     MITRAL VALVE REPAIR N/A 12/23/2019   Procedure: MITRAL VALVE REPAIR;  Surgeon: [name], MD;  Location: [place] [facility];  Service: Cardiovascular;  Laterality: N/A;   RIGHT/LEFT HEART CATH AND CORONARY/GRAFT ANGIOGRAPHY N/A 09/09/2019   Procedure: RIGHT/LEFT HEART CATH AND CORONARY/GRAFT ANGIOGRAPHY;  Surgeon: [name], MD;  Location: [place] [facility];  Service: Cardiovascular;  Laterality: N/A;   TEE WITHOUT CARDIOVERSION N/A 09/13/2019   Procedure: TRANSESOPHAGEAL ECHOCARDIOGRAM (TEE);  Surgeon: [name], MD;  Location[facility];  Service: Cardiovascular;  Laterality: N/A;   TEE WITHOUT CARDIOVERSION N/A 12/23/2019   Procedure: TRANSESOPHAGEAL ECHOCARDIOGRAM (TEE);  Surgeon: [name], MD;  Location: [place] [facility];  Service: Cardiovascular;  Laterality: N/A;   TEE WITHOUT CARDIOVERSION  02/01/2020   Procedure: TRANSESOPHAGEAL ECHOCARDIOGRAM (TEE);  Surgeon: [name], MD;  Location: [place] [facility];  Service: Cardiovascular;;   TOTAL HIP ARTHROPLASTY  1994, 1995   Patient Active Problem List   Diagnosis Date Noted   Lipoma of right upper extremity 03/02/2021   Trigger little finger of left hand 09/29/2020   Secondary hypercoagulable state ([place]) 02/29/2020   Depression, major, single episode, moderate ([employer])  02/28/2020   Nonrheumatic tricuspid valve regurgitation    S/P mitral valve clip implantation 12/23/2019   Mitral valve disease 12/23/2019   Nonrheumatic mitral valve regurgitation    Persistent atrial fibrillation (HCC)    Chronic systolic CHF (congestive heart failure) (H[facility])    Acute idiopathic gout involving toe of left foot    Thrombocytopenia ([place]) 09/08/2019   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 ([place])    Diabetes mellitus type 2  in obese ([place]) 06/22/2019   Major depressive disorder    Obstructive sleep apnea on CPAP    Hypothyroidism 10/15/2017   Subclavian artery stenosis, right ([place]) 06/15/2017   Type 2 diabetes mellitus without complication, without long-term current use of insulin ([place]) 02/26/2017   Severe obesity (BMI 35.0-39.9) with comorbidity ([place]) 02/26/2017   Essential hypertension 02/26/2017   Memory change 01/10/2017   Urge incontinence of urine 01/10/2017   Lightheaded 06/25/2016   AK (actinic keratosis) 01/03/2016   H/O syncope 03/06/2014   Occlusion and stenosis of left carotid artery 01/11/2014   Routine history and physical examination of adult 11/08/2013   History of repair of hip joint 04/20/2013   Hyperlipidemia 04/20/2013   Presence of artificial hip joint 04/20/2013   Vitamin D deficiency 06/18/2010   Concussion 01/24/2010   Elevated prostate specific antigen (PSA) 01/01/2007   Benign prostatic hyperplasia without urinary obstruction 04/22/2006   Osteoarthrosis 04/22/2006   Dyslipidemia 04/02/2006    ONSET DATE: 02/01/22  REFERRING DIAG:  R29.6 (ICD-10-CM) - Recurrent falls  S06.0X0A (ICD-10-CM) - Concussion without loss of consciousness, initial encounter    THERAPY DIAG:  Muscle weakness (generalized)  Other lack of coordination  Attention and concentration deficit  Frontal lobe and executive function deficit  Other abnormalities of gait and mobility  Rationale for Evaluation and Treatment: Rehabilitation  SUBJECTIVE:   SUBJECTIVE STATEMENT: Pt reports that his shoulder is a little better    Pt accompanied by: self  PERTINENT HISTORY: Past medical history significant for coronary artery disease, type 2 diabetes mellitus, previous MI, chronic systolic CHF, persistent A-fib  Had a fall on 02/01/22- tripped in a grocery store   DIAGNOSTIC FINDINGS:  CT cervical spine:   No evidence of acute fracture or traumatic malalignment.   CT head:   1. No evidence of  acute intracranial abnormality. 2. Stable chronic hypodense bilateral subdural collections. 3. Right forehead contusion.     PAIN: PAIN:  Are you having pain? None at rest however with certain positions Yes: NPRS scale: 3/10 Pain location: right shoulder  Pain description: sharp Aggravating factors: malpositioning Relieving factors: rest     PRECAUTIONS: Fall  WEIGHT BEARING RESTRICTIONS: No  PAIN:  Are you having pain? No  FALLS: Has patient fallen in last 6 months? Yes. Number of falls 2  LIVING ENVIRONMENT: Lives with: lives with their spouse Lives in: House/apartment Stairs: No Has following equipment at home:  built in shower bench  PLOF: Needs assistance with ADLs  PATIENT GOALS: increased ease with LB dressing  OBJECTIVE:   HAND DOMINANCE: Right  ADLs: Overall ADLs: needs assist with LB Transfers/ambulation related to ADLs:falls in grocery store Eating: mod I Grooming: mod I UB Dressing: mod I LB Dressing: needs help with socks Toileting: mod I Bathing: mod I Tub Shower transfers: mod I   IADLs: Shopping: needs assist, fell in grocery store x 2 Light housekeeping: pt's wife is doing more currently Meal Prep: cooks meals mod I Community mobility: falls in walking  in grocery Medication management: keeps up with own meds Financial management: pt reports he handles finances Handwriting: 90% legible  MOBILITY STATUS: Hx of falls      UPPER EXTREMITY ROM:  WFLS    UPPER EXTREMITY MMT:     MMT Right eval Left eval  Shoulder flexion 4/5 4+/5  Shoulder abduction    Shoulder adduction    Shoulder extension    Shoulder internal rotation    Shoulder external rotation    Middle trapezius    Lower trapezius    Elbow flexion 4+/5 4+/5  Elbow extension 4+/5 4+/5  Wrist flexion    Wrist extension    Wrist ulnar deviation    Wrist radial deviation    Wrist pronation    Wrist supination    (Blank rows = not tested)  HAND FUNCTION: Grip  strength: Right: 53 lbs; Left: 73 lbs  COORDINATION: 9 Hole Peg test: Right: 41.74 sec; Left: 33.53 sec Action tremor present SENSATION: WFL    COGNITION: Overall cognitive status: Impaired recall 2/3 items following short delay, may have delayed processing , cognition to be further addressed within a functional context   VISION: Subjective report: denies visual changes  Baseline vision: Wears glasses all the time   VISION ASSESSMENT: Not tested     OBSERVATIONS: action tremor bilateral UE's   TODAY'S TREATMENT:                                                                                                                              DATE:04/04/22-hot pack applied to right shoulder x 10 mins while pt performed the coordination activities below, no adverse reactions to heat . Holding coins in hand to stack then picking up stacks and manipulating in hand to place in container.  Flipping and dealing playing cards with right and LUE's, rotating card in hand, then twirling a pen  between fingers of R hand .  Supine closed chain chest press and shoulder flexion with PVC pipe frame, min v.c for shoulder/ scapular positioning, at least 15 reps each. Standing to perform bilateral shoulder extension/ scapular retraction with bilateral UE's green therabnad 10-15 reps then rowing, 10-15 reps min v.c Arm bike x 8 mins level 4 for conditioning.   04/02/22 Arm bike x 8 mins level 4 for conditioning. Pt reports L shoulder pain today. Therapist downgraded pt's theraband to green. Pt reviewed all exercises, shoulder flexion and abduction with green theraband were discontinued for the time being as they increase pt discomfort. Pt performed gentle closed chain shoulder flexion and with no increased pain.  Fine motor coordination activities: Copying small peg design with RUE, then removing with in hand manipulation, min v.c Rotating a single ball in hand then tossing between hands and then in  each hand, min difficulty/ v.c  Gripper set at level 4 to pick up inch blocks for sustained grip, min difficulty/ drops, 1 rest break for 1/2 blocks.  Handwriting activity using foam  grip, pt demonstrates improved legibility and letter size.     03/28/22- Arm bike x 8 mins level 3 for conditioning. Therapist upgraded previously issued HEP to blue, 15 reps each bilateral UE's. Pt returned demonstration with min v.c  Therapist reviewed coordination HEP with patient and added stacking coins, manipulating coins and rotating 2 small balls in hand min v.c and demonstration for techniques and managing tremor.  3/12- Reviewed green theraband HEP 10 reps each, bilateral UE's today,  min v.c initially then pt returned demonstration.  Arm bike x 6 min level 3 for conditioning.  Gripper set at level 3 to pick up 1 inch blocks, resistance increased to level 4 to pick up second 1/2 of blocks, min difficulty/ drops, 1 rest break.  Grooved pegboard for increased fine motor coordination and in hand manipulation, min difficulty for in hand manipulation.  Handwriting activity using foam grip, pt printed the alphabet with v.c for positioning, and cues for letter size then pt printed several sentences with good legibility and letter size.      03/19/22- Pt practiced doffing then donning socks using reach, sock aide, min difficulty and increased time and min v.c Pt was provided with information for purchase.   Purdue pegboard for increased RUE fine motor coordination and in hand  manipulation, min difficulty/ v.c  Green theraband exercises 10-15 reps each, min v.c for proximal strengthening.  Memory compensation strategies, reviewed and handout issued     PATIENT EDUCATION 04/02/22- green  theraband HEP, shoulder abduction and flexion temporarily d/c due to pain. Person educated: Patient Education method: Explanation, Demonstration, Verbal cues, handout Education comprehension: verbalized  understanding, returned demonstration, and verbal cues required   HOME EXERCISE PROGRAM: N/A   GOALS: Goals reviewed with patient? Yes  SHORT TERM GOALS: Target date: 04/08/22  I with HEP for coordination and grip strength Baseline: Goal status met HEP issued 03/14/22  2.  Pt will verbalize understanding of compensatory strategies for short term memory deficits. Baseline: recalls 2/3 items following short delay Goal status: met, pt verbalizes understanding. 03/26/22  3.  Pt will increase RUE grip strength by 5 lbs for increased functional use Baseline: Grip strength: Right: 53 lbs; Left: 73 lbs Goal status:ongoing- HEP issued 03/14/22   LONG TERM GOALS: Target date: 05/06/22  I with HEP for proximal strength Baseline:  Goal status: ongoing, issued, may benefit from updates 03/26/22  2.  Pt will demonstrate improved fine motor coordination for ADLS as evidenced by decreasing 9 hole peg test by 3 secs for RUE. Baseline: 9 Hole Peg test: Right: 41.74 sec; Left: 33.53 sec Goal status:  ongoing 39.35,33.73  3.  Pt will donn socks mod I. Baseline: needs assist Goal status: met, pt purchased a sock aide and he is now donning his own socks, 03/26/22 4.  Pt will resume prior level of home management activities demonstrating good safety awareness. Baseline: pt's wife is currently performing. Goal status: INITIAL    ASSESSMENT:  CLINICAL IMPRESSION: Pt is progressing towards goals. Pt's shoulder pain was improved at end of session today. PERFORMANCE DEFICITS: in functional skills including ADLs, IADLs, coordination, dexterity, tone, ROM, strength, pain, flexibility, Fine motor control, Gross motor control, mobility, balance, endurance, decreased knowledge of precautions, decreased knowledge of use of DME, vision, and UE functional use, cognitive skills including attention, memory, problem solving, and thought, and psychosocial skills including coping strategies, environmental adaptation,  habits, interpersonal interactions, and routines and behaviors.   IMPAIRMENTS: are limiting patient from ADLs, IADLs, play, leisure,  and social participation.   CO-MORBIDITIES: may have co-morbidities  that affects occupational performance. Patient will benefit from skilled OT to address above impairments and improve overall function.  MODIFICATION OR ASSISTANCE TO COMPLETE EVALUATION: No modification of tasks or assist necessary to complete an evaluation.  OT OCCUPATIONAL PROFILE AND HISTORY: Problem focused assessment: Including review of records relating to presenting problem.  CLINICAL DECISION MAKING: LOW - limited treatment options, no task modification necessary  REHAB POTENTIAL: Good  EVALUATION COMPLEXITY: Low    PLAN:  OT FREQUENCY:2x/ week - resent to update frequency  OT DURATION: 8 weeks plus eval  PLANNED INTERVENTIONS: self care/ADL training, therapeutic exercise, therapeutic activity, neuromuscular re-education, manual therapy, passive range of motion, balance training, functional mobility training, moist heat, patient/family education, cognitive remediation/compensation, energy conservation, coping strategies training, and DME and/or AE instructions  RECOMMENDED OTHER SERVICES: PT  CONSULTED AND AGREED WITH PLAN OF CARE: Patient  PLAN FOR NEXT SESSION: monitor shoulder pain, continue to work toward goals, fine motor coordination, UE strength and endurance, Irvin Bastin, OT 04/04/2022, 10:54 AM

## 2022-04-04 NOTE — Therapy (Signed)
OUTPATIENT PHYSICAL THERAPY LOWER EXTREMITY TREATMENT  Patient Name: Raymond Christensen MRN: [id number] DOB:1940/07/24, 82 y.o., male Today's Date: 04/04/2022  END OF SESSION:  PT End of Session - 04/04/22 1146     Visit Number 13    Date for PT Re-Evaluation 05/09/22    PT Start Time 1145    PT Stop Time 1230    PT Time Calculation (min) 45 min    Activity Tolerance Patient tolerated treatment well    Behavior During Therapy [facility] for tasks assessed/performed                 Past Medical History:  Diagnosis Date   Arthritis    CAD (coronary artery disease)    Carotid artery disease ([place])    s/p left CEA [id number]   Chronic systolic CHF (congestive heart failure) ([place])    Concussion Summer 2012   Essential hypertension 02/26/2017   Heart disease    Hyperlipidemia    Hypothyroidism 10/15/2017   Major depressive disorder    Mitral valve regurgitation    Myocardial infarction ([place]) 1996, 2021   Obstructive sleep apnea on CPAP    Persistent atrial fibrillation (HCC)    Poor flexibility of tendon 12/01/2018   S/P mitral valve clip implantation 12/23/2019   s/p TEER with MitraClip with one XTW by Dr. Burt Knack   Subungual hematoma of digit of hand 12/01/2018   Subungual hematoma of right foot 12/01/2018   Thyroid disease    Type 2 diabetes mellitus without complication, without long-term current use of insulin ([place]) 02/26/2017   Past Surgical History:  Procedure Laterality Date   ATRIAL FIBRILLATION ABLATION N/A 02/01/2020   Procedure: ATRIAL FIBRILLATION ABLATION;  Surgeon: [name], MD;  Location: [place] [facility];  Service: Cardiovascular;  Laterality: N/A;   BYPASS GRAFT  2001   CARDIAC CATHETERIZATION     CARDIOVERSION N/A 11/08/2019   Procedure: CARDIOVERSION;  Surgeon: [name], MD;  Location: [place];  Service: Cardiovascular;  Laterality: N/A;   CAROTID ENDARTERECTOMY Left 01/17/2014   CORONARY ANGIOPLASTY     CORONARY ARTERY BYPASS GRAFT      MITRAL VALVE REPAIR N/A 12/23/2019   Procedure: MITRAL VALVE REPAIR;  Surgeon: [name], MD;  Location: [place] [facility];  Service: Cardiovascular;  Laterality: N/A;   RIGHT/LEFT HEART CATH AND CORONARY/GRAFT ANGIOGRAPHY N/A 09/09/2019   Procedure: RIGHT/LEFT HEART CATH AND CORONARY/GRAFT ANGIOGRAPHY;  Surgeon: [name], MD;  Location: [place] [facility];  Service: Cardiovascular;  Laterality: N/A;   TEE WITHOUT CARDIOVERSION N/A 09/13/2019   Procedure: TRANSESOPHAGEAL ECHOCARDIOGRAM (TEE);  Surgeon: [name], MD;  [facility];  Service: Cardiovascular;  Laterality: N/A;   TEE WITHOUT CARDIOVERSION N/A 12/23/2019   Procedure: TRANSESOPHAGEAL ECHOCARDIOGRAM (TEE);  Surgeon: [name], MD;  Location: [place] [facility];  Service: Cardiovascular;  Laterality: N/A;   TEE WITHOUT CARDIOVERSION  02/01/2020   Procedure: TRANSESOPHAGEAL ECHOCARDIOGRAM (TEE);  Surgeon: [name], MD;  Location: [place] [facility];  Service: Cardiovascular;;   TOTAL HIP ARTHROPLASTY  1994, 1995   Patient Active Problem List   Diagnosis Date Noted   Lipoma of right upper extremity 03/02/2021   Trigger little finger of left hand 09/29/2020   Secondary hypercoagulable state ([place]) 02/29/2020   Depression, major, single episode, moderate (HCC) 02/28/2020   Nonrheumatic tricuspid valve regurgitation    S/P mitral valve clip implantation 12/23/2019   Mitral valve disease 12/23/2019   Nonrheumatic mitral valve regurgitation  Persistent atrial fibrillation (HCC)    Chronic systolic CHF (congestive heart failure) (HCC)    Acute idiopathic gout involving toe of left foot    Thrombocytopenia ([place]) 09/08/2019   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 ([place])    Diabetes mellitus type 2 in obese ([place]) 06/22/2019   Major depressive disorder    Obstructive sleep apnea on CPAP    Hypothyroidism 10/15/2017   Subclavian artery  stenosis, right ([place]) 06/15/2017   Type 2 diabetes mellitus without complication, without long-term current use of insulin ([place]) 02/26/2017   Severe obesity (BMI 35.0-39.9) with comorbidity ([place]) 02/26/2017   Essential hypertension 02/26/2017   Memory change 01/10/2017   Urge incontinence of urine 01/10/2017   Lightheaded 06/25/2016   AK (actinic keratosis) 01/03/2016   H/O syncope 03/06/2014   Occlusion and stenosis of left carotid artery 01/11/2014   Routine history and physical examination of adult 11/08/2013   History of repair of hip joint 04/20/2013   Hyperlipidemia 04/20/2013   Presence of artificial hip joint 04/20/2013   Vitamin D deficiency 06/18/2010   Concussion 01/24/2010   Elevated prostate specific antigen (PSA) 01/01/2007   Benign prostatic hyperplasia without urinary obstruction 04/22/2006   Osteoarthrosis 04/22/2006   Dyslipidemia 04/02/2006    PCP: Riki Sheer  REFERRING PROVIDER: Riki Sheer  REFERRING DIAG: M53.3- Sacroiliac dysfunction  THERAPY DIAG:  Other lack of coordination  Muscle weakness (generalized)  Repeated falls  History of falling  Rationale for Evaluation and Treatment: Rehabilitation  ONSET DATE: 12/26/21  SUBJECTIVE:   SUBJECTIVE STATEMENT: Doing ok,no pain in the legs  PERTINENT HISTORY: Past medical history significant for coronary artery disease, type 2 diabetes mellitus, previous MI, chronic systolic CHF, persistent A-fib  Had a fall on 02/01/22- tripped in a grocery store  PAIN:  Are you having pain? Yes: NPRS scale: 0/10 Pain location: head, shoulder, hip Pain description: sharp if I press on it other than that it is just dull Aggravating factors: nothing specifically Relieving factors: medicine  PRECAUTIONS: None  WEIGHT BEARING RESTRICTIONS: No  FALLS:  Has patient fallen in last 6 months? Yes. Number of falls 2  LIVING ENVIRONMENT: Lives with: lives with their spouse Lives in:  House/apartment Stairs: No Has following equipment at home: Environmental consultant - 2 wheeled  OCCUPATION: Retired  PLOF: Independent  PATIENT GOALS: I want to make sure I can become ambulatory again and get around without the walker  NEXT MD VISIT:   OBJECTIVE:   DIAGNOSTIC FINDINGS:  CT cervical spine:   No evidence of acute fracture or traumatic malalignment.  CT head:   1. No evidence of acute intracranial abnormality. 2. Stable chronic hypodense bilateral subdural collections. 3. Right forehead contusion.  IMPRESSION: Negative.  COGNITION: Overall cognitive status: Within functional limits for tasks assessed, some delay in response and wife reports some memory deficits    SENSATION: WFL   MUSCLE LENGTH: Hamstrings:bilateral tightness, more in LLE  POSTURE: rounded shoulders, forward head, and flexed trunk   PALPATION: Tightness in bilateral UT, more in R side LOWER EXTREMITY ROM: grossly WFL some pain with R side hip flexion    LOWER EXTREMITY MMT: grossly 4/5    FUNCTIONAL TESTS:  5 times sit to stand: 21.31s with UE use Timed up and go (TUG): 22.27s  GAIT: Distance walked: in clinic distances Assistive device utilized: Environmental consultant - 2 wheeled Level of assistance: Modified independence Comments: shuffling feet, decreased stance time bilaterally, decrease stride length, poor foot clearance  TODAY'S TREATMENT:                                                                                                                              DATE:  04/04/22 NuStep L5 x 6 min Bike L2.5  x 4 min S2S OHP on airex 2x10 On airex with ball toss Side stepping inside 6 in box, then with airex on one side of box HS curls 25lb 2x15 Leg Ext 10lb 2x10 Passive progressive HS stetch   04/02/22 Bike L3 x 6 min NuStep L4 x 4 min Side step and tandem walking on balance bean in // bars Side step on balance beam over object in // bars  Side step on and off airex  Passive progressive  HS stetch  03/28/22 NuStep L5 x87mins Passive stretching of HS, piriformis, glutes, IT band  Knees to chest 30s  Trunk rotations 2x10 Bridges 2x10  SLS at counter 20s  Tandem standing at counter STS on airex Standing on airex cone taps 20 reps CGA req Standing on airex shoulder ext 10# 2x10    03/26/22 Reassess goals- TUG and 5xSTS BERG 46/56 Walking outdoors around [employer] NuStep L5 x46mins  HS stretch seated 30s  Knees to chest and piriformis stretch supine 30s Marching on airex Side steps on airex Walking on beam   03/21/22 NuStep L5 x 6 min Resisted side step over objets 20lb x5 each On airex ball toss  S2S OHP on airex 2x10 6in step ups from airex 2x5 each Side step on an off airex  Calf stretch with black bar  35lb hamstring curls 2x10 Leg Ext 10lb 2x10 Passive Hs and piriformis stretch.    03/19/22 NuStep L5 x 6 min Resisted gait 30lb 4 way x 4 each Shoulder Ext for core 10lb 2x10  On airex alt 6in box taps x10 S2S on airex 2x10 Alternate taps on cone on the floor x 10, mildly unsteady.  Side steps and tandem waling on balance beam in // bars Leg press 40# 3x12 HS self stretch   PATIENT EDUCATION:  Education details: POC and HEP Person educated: Patient and Spouse Education method: Explanation Education comprehension: verbalized understanding  HOME EXERCISE PROGRAM:  Access Code: [email or web address] URL: [email or web address] Date: 02/14/2022 Prepared by: Andris Baumann  Exercises - Sit to Stand with Arms Crossed  - 1 x daily - 7 x weekly - 2 sets - 10 reps - Standing March with Counter Support  - 1 x daily - 7 x weekly - 2 sets - 10 reps - Standing Hip Abduction with Unilateral Counter Support  - 1 x daily - 7 x weekly - 2 sets - 10 reps - Standing with Head Rotation  - 1 x daily - 7 x weekly - 2 sets - 5 reps - 20 hold - Romberg Stance with Head Nods  - 1 x daily - 7 x weekly - 2 sets - 5 reps - 20 hold  ASSESSMENT:  CLINICAL  IMPRESSION: Patient doing well overall, no reports of falls or stumbles. He demonstrates some instability side stepping inside and outside of box.  CGG needed with side step on and off box. Pt tends to put more weight in heels when standing on airex. Continued to work on stretching as he is still very tight especially in hips and hamstrings.   OBJECTIVE IMPAIRMENTS: Abnormal gait, decreased balance, decreased mobility, difficulty walking, decreased strength, and pain.   ACTIVITY LIMITATIONS: bending, stairs, transfers, bed mobility, and locomotion level  PARTICIPATION LIMITATIONS: driving, shopping, and yard work  [employer] POTENTIAL: Good  CLINICAL DECISION MAKING: Stable/uncomplicated  EVALUATION COMPLEXITY: Low   GOALS: Goals reviewed with patient? Yes  SHORT TERM GOALS: Target date: 03/28/22  Patient will be independent with initial HEP. Goal status: met  2.  Patient will demonstrate decreased fall risk by scoring < 15 sec on TUG. Baseline: 22.27s, 9.78s Goal status: MET  3.  Patient will be educated on strategies to decrease risk of falls.  Goal status: MET   LONG TERM GOALS: Target date: 05/09/22  Patient will be independent with advanced/ongoing HEP to improve outcomes and carryover.  Goal status: IN PROGRESS  2.  Patient will be able to ambulate 600' with LRAD with good safety to access community.  Baseline: using RW after fall Goal status: MET   3.  Patient will demonstrate improved functional LE strength as demonstrated by decreased 5xSTS to <14s. Baseline: 21.21s, 11.80s Goal status: MET   4.  Patient will score 46 on Berg Balance test to demonstrate lower risk of falls. (MCID= 8 points) .  Baseline: 38, 46 Goal status: MET  5. Patient will demonstrate increased flexibility by being able to touch ankles   Baseline: mid shin  Goal status: INITIAL  6. Patient will demonstrate SLS at least 3 seconds or more bilaterally   Baseline: unable to do 03/26/22 >10s BLE  03/28/22  Goal status: MET   PLAN:  PT FREQUENCY: 1-2x/week  PT DURATION: 12 weeks  PLANNED INTERVENTIONS: Therapeutic exercises, Therapeutic activity, Neuromuscular re-education, Balance training, Gait training, Patient/Family education, Self Care, Joint mobilization, Stair training, Dry Needling, Electrical stimulation, Cryotherapy, Moist heat, Vasopneumatic device, Ionotophoresis 4mg /ml Dexamethasone, and Manual therapy  PLAN FOR NEXT SESSION: work on strength and balance and stretch!!  Andris Baumann, DPT 04/04/22 11:46 AM

## 2022-04-08 ENCOUNTER — Ambulatory Visit: Payer: Medicare (Managed Care) | Admitting: Physical Therapy

## 2022-04-08 ENCOUNTER — Ambulatory Visit: Payer: Medicare (Managed Care) | Admitting: Occupational Therapy

## 2022-04-08 ENCOUNTER — Encounter: Payer: Self-pay | Admitting: Physical Therapy

## 2022-04-08 DIAGNOSIS — Z9181 History of falling: Secondary | ICD-10-CM

## 2022-04-08 DIAGNOSIS — M6281 Muscle weakness (generalized): Secondary | ICD-10-CM

## 2022-04-08 DIAGNOSIS — R278 Other lack of coordination: Secondary | ICD-10-CM

## 2022-04-08 DIAGNOSIS — R41840 Attention and concentration deficit: Secondary | ICD-10-CM

## 2022-04-08 DIAGNOSIS — R262 Difficulty in walking, not elsewhere classified: Secondary | ICD-10-CM

## 2022-04-08 DIAGNOSIS — R2689 Other abnormalities of gait and mobility: Secondary | ICD-10-CM

## 2022-04-08 DIAGNOSIS — R41844 Frontal lobe and executive function deficit: Secondary | ICD-10-CM

## 2022-04-08 NOTE — Therapy (Signed)
OUTPATIENT OCCUPATIONAL THERAPY NEURO Treatment  Patient Name: Raymond Christensen MRN: [id number] DOB:[birth year], 82 y.o., male Today's Date: 04/08/2022  PCP:Dr. Nani Ravens REFERRING PROVIDER: Dr. Nani Ravens  END OF SESSION:  OT End of Session - 04/08/22 0813     Visit Number 8    Number of Visits 17    Date for OT Re-Evaluation 05/06/22    Authorization Type generic Medicare    Authorization - Visit Number 8    Progress Note Due on Visit 10    OT Start Time 0843    OT Stop Time 0928    OT Time Calculation (min) 45 min    Activity Tolerance Patient tolerated treatment well    Behavior During Therapy [facility] for tasks assessed/performed                  Past Medical History:  Diagnosis Date   Arthritis    CAD (coronary artery disease)    Carotid artery disease ([place])    s/p left CEA [id number]   Chronic systolic CHF (congestive heart failure) ([place])    Concussion Summer 2012   Essential hypertension 02/26/2017   Heart disease    Hyperlipidemia    Hypothyroidism 10/15/2017   Major depressive disorder    Mitral valve regurgitation    Myocardial infarction ([place]) 1996, 2021   Obstructive sleep apnea on CPAP    Persistent atrial fibrillation ([facility])    Poor flexibility of tendon 12/01/2018   S/P mitral valve clip implantation 12/23/2019   s/p TEER with MitraClip with one XTW by Dr. Burt Knack   Subungual hematoma of digit of hand 12/01/2018   Subungual hematoma of right foot 12/01/2018   Thyroid disease    Type 2 diabetes mellitus without complication, without long-term current use of insulin ([place]) 02/26/2017   Past Surgical History:  Procedure Laterality Date   ATRIAL FIBRILLATION ABLATION N/A 02/01/2020   Procedure: ATRIAL FIBRILLATION ABLATION;  Surgeon: [name], MD;  Location: [place] [facility];  Service: Cardiovascular;  Laterality: N/A;   BYPASS GRAFT  2001   CARDIAC CATHETERIZATION     CARDIOVERSION N/A 11/08/2019   Procedure: CARDIOVERSION;  Surgeon: [name], MD;  Location: [place];  Service: Cardiovascular;  Laterality: N/A;   CAROTID ENDARTERECTOMY Left 01/17/2014   CORONARY ANGIOPLASTY     CORONARY ARTERY BYPASS GRAFT     MITRAL VALVE REPAIR N/A 12/23/2019   Procedure: MITRAL VALVE REPAIR;  Surgeon: [name], MD;  Location: [place] [facility];  Service: Cardiovascular;  Laterality: N/A;   RIGHT/LEFT HEART CATH AND CORONARY/GRAFT ANGIOGRAPHY N/A 09/09/2019   Procedure: RIGHT/LEFT HEART CATH AND CORONARY/GRAFT ANGIOGRAPHY;  Surgeon: [name], MD;  Location: [place] [facility];  Service: Cardiovascular;  Laterality: N/A;   TEE WITHOUT CARDIOVERSION N/A 09/13/2019   Procedure: TRANSESOPHAGEAL ECHOCARDIOGRAM (TEE);  Surgeon: [name], MD;  [facility];  Service: Cardiovascular;  Laterality: N/A;   TEE WITHOUT CARDIOVERSION N/A 12/23/2019   Procedure: TRANSESOPHAGEAL ECHOCARDIOGRAM (TEE);  Surgeon: [name], MD;  Location: [place] [facility];  Service: Cardiovascular;  Laterality: N/A;   TEE WITHOUT CARDIOVERSION  02/01/2020   Procedure: TRANSESOPHAGEAL ECHOCARDIOGRAM (TEE);  Surgeon: [name], MD;  Location: [place] [facility];  Service: Cardiovascular;;   TOTAL HIP ARTHROPLASTY  1994, 1995   Patient Active Problem List   Diagnosis Date Noted   Lipoma of right upper extremity 03/02/2021   Trigger little finger of left hand 09/29/2020   Secondary hypercoagulable state ([place]) 02/29/2020  Depression, major, single episode, moderate ([place]) 02/28/2020   Nonrheumatic tricuspid valve regurgitation    S/P mitral valve clip implantation 12/23/2019   Mitral valve disease 12/23/2019   Nonrheumatic mitral valve regurgitation    Persistent atrial fibrillation (HCC)    Chronic systolic CHF (congestive heart failure) (H[facility])    Acute idiopathic gout involving toe of left foot    Thrombocytopenia ([place]) 09/08/2019   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 ([place])    Diabetes mellitus type 2 in obese ([place]) 06/22/2019   Major depressive disorder    Obstructive sleep apnea on CPAP    Hypothyroidism 10/15/2017   Subclavian artery stenosis, right ([place]) 06/15/2017   Type 2 diabetes mellitus without complication, without long-term current use of insulin ([place]) 02/26/2017   Severe obesity (BMI 35.0-39.9) with comorbidity ([place]) 02/26/2017   Essential hypertension 02/26/2017   Memory change 01/10/2017   Urge incontinence of urine 01/10/2017   Lightheaded 06/25/2016   AK (actinic keratosis) 01/03/2016   H/O syncope 03/06/2014   Occlusion and stenosis of left carotid artery 01/11/2014   Routine history and physical examination of adult 11/08/2013   History of repair of hip joint 04/20/2013   Hyperlipidemia 04/20/2013   Presence of artificial hip joint 04/20/2013   Vitamin D deficiency 06/18/2010   Concussion 01/24/2010   Elevated prostate specific antigen (PSA) 01/01/2007   Benign prostatic hyperplasia without urinary obstruction 04/22/2006   Osteoarthrosis 04/22/2006   Dyslipidemia 04/02/2006    ONSET DATE: 02/01/22  REFERRING DIAG:  R29.6 (ICD-10-CM) - Recurrent falls  S06.0X0A (ICD-10-CM) - Concussion without loss of consciousness, initial encounter    THERAPY DIAG:  Muscle weakness (generalized)  Other lack of coordination  Attention and concentration deficit  Frontal lobe and executive function deficit  Other abnormalities of gait and mobility  Rationale for Evaluation and Treatment: Rehabilitation  SUBJECTIVE:   SUBJECTIVE STATEMENT: Pt reports  he slept on his shoulder wrong   Pt accompanied by: self  PERTINENT HISTORY: Past medical history significant for coronary artery disease, type 2 diabetes mellitus, previous MI, chronic systolic CHF, persistent A-fib  Had a fall on 02/01/22- tripped in a grocery store   DIAGNOSTIC FINDINGS:  CT cervical spine:   No evidence of acute fracture or traumatic  malalignment.   CT head:   1. No evidence of acute intracranial abnormality. 2. Stable chronic hypodense bilateral subdural collections. 3. Right forehead contusion.     PAIN: PAIN:  Are you having pain? None at rest however with certain positions Yes: NPRS scale: 3/10 Pain location: right shoulder  Pain description: sharp Aggravating factors: malpositioning, slept wrong  Relieving factors: rest     PRECAUTIONS: Fall  WEIGHT BEARING RESTRICTIONS: No  PAIN:  Are you having pain? No  FALLS: Has patient fallen in last 6 months? Yes. Number of falls 2  LIVING ENVIRONMENT: Lives with: lives with their spouse Lives in: House/apartment Stairs: No Has following equipment at home:  built in shower bench  PLOF: Needs assistance with ADLs  PATIENT GOALS: increased ease with LB dressing  OBJECTIVE:   HAND DOMINANCE: Right  ADLs: Overall ADLs: needs assist with LB Transfers/ambulation related to ADLs:falls in grocery store Eating: mod I Grooming: mod I UB Dressing: mod I LB Dressing: needs help with socks Toileting: mod I Bathing: mod I Tub Shower transfers: mod I   IADLs: Shopping: needs assist, fell in grocery store x 2 Light housekeeping: pt's wife is doing more currently Meal Prep: cooks  meals mod I Community mobility: falls in walking in grocery Medication management: keeps up with own meds Financial management: pt reports he handles finances Handwriting: 90% legible  MOBILITY STATUS: Hx of falls      UPPER EXTREMITY ROM:  WFLS    UPPER EXTREMITY MMT:     MMT Right eval Left eval  Shoulder flexion 4/5 4+/5  Shoulder abduction    Shoulder adduction    Shoulder extension    Shoulder internal rotation    Shoulder external rotation    Middle trapezius    Lower trapezius    Elbow flexion 4+/5 4+/5  Elbow extension 4+/5 4+/5  Wrist flexion    Wrist extension    Wrist ulnar deviation    Wrist radial deviation    Wrist pronation    Wrist  supination    (Blank rows = not tested)  HAND FUNCTION: Grip strength: Right: 53 lbs; Left: 73 lbs  COORDINATION: 9 Hole Peg test: Right: 41.74 sec; Left: 33.53 sec Action tremor present SENSATION: WFL    COGNITION: Overall cognitive status: Impaired recall 2/3 items following short delay, may have delayed processing , cognition to be further addressed within a functional context   VISION: Subjective report: denies visual changes  Baseline vision: Wears glasses all the time   VISION ASSESSMENT: Not tested     OBSERVATIONS: action tremor bilateral UE's   TODAY'S TREATMENT:                                                                                                                              DATE:04/08/22-hot pack applied to right shoulder x 10 mins while pt performed the coordination activities below, no adverse reactions to heat . Copying small peg design with RUE while holding several pegs in R hand for in hand manipulation, min difficulty/ v.c. then removing with right and left hands alternately with in hand manipulation.  Seated closed chain shoulder flexion, then diagonals min v.c 10 reps each.  Activities for core stability and UE strength: Quadruped for cat and cow, then rocking forwards and back wads, then bird dog lifting alternate UE and LE's, min facilitation/ v.c Rolling medium ball up the wall with bilateral UE's. Pt attempted wall pushups however it aggravates his shoulder so discontinued. Arm bike x 8 mins level 4 for conditioning. Therapist discussed with pt bed positioning with RUE supported on pillow if sleeping on left side to minimize pain.  Therapist discussed potential d/c next visit.     04/04/22-hot pack applied to right shoulder x 10 mins while pt performed the coordination activities below, no adverse reactions to heat . Holding coins in hand to stack then picking up stacks and manipulating in hand to place in container.  Flipping and  dealing playing cards with right and LUE's, rotating card in hand, then twirling a pen  between fingers of R hand .  Supine closed chain chest press and shoulder flexion with PVC pipe frame,  min v.c for shoulder/ scapular positioning, at least 15 reps each. Standing to perform bilateral shoulder extension/ scapular retraction with bilateral UE's green therabnad 10-15 reps then rowing, 10-15 reps min v.c Arm bike x 8 mins level 4 for conditioning.   04/02/22 Arm bike x 8 mins level 4 for conditioning. Pt reports L shoulder pain today. Therapist downgraded pt's theraband to green. Pt reviewed all exercises, shoulder flexion and abduction with green theraband were discontinued for the time being as they increase pt discomfort. Pt performed gentle closed chain shoulder flexion and with no increased pain.  Fine motor coordination activities: Copying small peg design with RUE, then removing with in hand manipulation, min v.c Rotating a single ball in hand then tossing between hands and then in each hand, min difficulty/ v.c  Gripper set at level 4 to pick up inch blocks for sustained grip, min difficulty/ drops, 1 rest break for 1/2 blocks.  Handwriting activity using foam grip, pt demonstrates improved legibility and letter size.     03/28/22- Arm bike x 8 mins level 3 for conditioning. Therapist upgraded previously issued HEP to blue, 15 reps each bilateral UE's. Pt returned demonstration with min v.c  Therapist reviewed coordination HEP with patient and added stacking coins, manipulating coins and rotating 2 small balls in hand min v.c and demonstration for techniques and managing tremor.  3/12- Reviewed green theraband HEP 10 reps each, bilateral UE's today,  min v.c initially then pt returned demonstration.  Arm bike x 6 min level 3 for conditioning.  Gripper set at level 3 to pick up 1 inch blocks, resistance increased to level 4 to pick up second 1/2 of blocks, min difficulty/ drops, 1  rest break.  Grooved pegboard for increased fine motor coordination and in hand manipulation, min difficulty for in hand manipulation.  Handwriting activity using foam grip, pt printed the alphabet with v.c for positioning, and cues for letter size then pt printed several sentences with good legibility and letter size.      03/19/22- Pt practiced doffing then donning socks using reach, sock aide, min difficulty and increased time and min v.c Pt was provided with information for purchase.   Purdue pegboard for increased RUE fine motor coordination and in hand  manipulation, min difficulty/ v.c  Green theraband exercises 10-15 reps each, min v.c for proximal strengthening.  Memory compensation strategies, reviewed and handout issued     PATIENT EDUCATION 04/02/22- green  theraband HEP, shoulder abduction and flexion temporarily d/c due to pain. Person educated: Patient Education method: Explanation, Demonstration, Verbal cues, handout Education comprehension: verbalized understanding, returned demonstration, and verbal cues required   HOME EXERCISE PROGRAM: N/A   GOALS: Goals reviewed with patient? Yes  SHORT TERM GOALS: Target date: 04/08/22  I with HEP for coordination and grip strength Baseline: Goal status met HEP issued 03/14/22  2.  Pt will verbalize understanding of compensatory strategies for short term memory deficits. Baseline: recalls 2/3 items following short delay Goal status: met, pt verbalizes understanding. 03/26/22  3.  Pt will increase RUE grip strength by 5 lbs for increased functional use Baseline: Grip strength: Right: 53 lbs; Left: 73 lbs Goal status:ongoing- HEP issued 03/14/22   LONG TERM GOALS: Target date: 05/06/22  I with HEP for proximal strength Baseline:  Goal status: ongoing, issued, may benefit from updates 03/26/22  2.  Pt will demonstrate improved fine motor coordination for ADLS as evidenced by decreasing 9 hole peg test by 3 secs for  RUE. Baseline:  9 Hole Peg test: Right: 41.74 sec; Left: 33.53 sec Goal status:  ongoing 39.35,33.73  3.  Pt will donn socks mod I. Baseline: needs assist Goal status: met, pt purchased a sock aide and he is now donning his own socks, 03/26/22 4.  Pt will resume prior level of home management activities demonstrating good safety awareness. Baseline: pt's wife is currently performing. Goal status: ongoing    ASSESSMENT:  CLINICAL IMPRESSION: Pt is progressing towards goals. Pt demonstrates improved strength and coordination. Therapist discussed potential d/c next visit with patient.PERFORMANCE DEFICITS: in functional skills including ADLs, IADLs, coordination, dexterity, tone, ROM, strength, pain, flexibility, Fine motor control, Gross motor control, mobility, balance, endurance, decreased knowledge of precautions, decreased knowledge of use of DME, vision, and UE functional use, cognitive skills including attention, memory, problem solving, and thought, and psychosocial skills including coping strategies, environmental adaptation, habits, interpersonal interactions, and routines and behaviors.   IMPAIRMENTS: are limiting patient from ADLs, IADLs, play, leisure, and social participation.   CO-MORBIDITIES: may have co-morbidities  that affects occupational performance. Patient will benefit from skilled OT to address above impairments and improve overall function.  MODIFICATION OR ASSISTANCE TO COMPLETE EVALUATION: No modification of tasks or assist necessary to complete an evaluation.  OT OCCUPATIONAL PROFILE AND HISTORY: Problem focused assessment: Including review of records relating to presenting problem.  CLINICAL DECISION MAKING: LOW - limited treatment options, no task modification necessary  REHAB POTENTIAL: Good  EVALUATION COMPLEXITY: Low    PLAN:  OT FREQUENCY:2x/ week - resent to update frequency  OT DURATION: 8 weeks plus eval  PLANNED INTERVENTIONS: self care/ADL  training, therapeutic exercise, therapeutic activity, neuromuscular re-education, manual therapy, passive range of motion, balance training, functional mobility training, moist heat, patient/family education, cognitive remediation/compensation, energy conservation, coping strategies training, and DME and/or AE instructions  RECOMMENDED OTHER SERVICES: PT  CONSULTED AND AGREED WITH PLAN OF CARE: Patient  PLAN FOR NEXT SESSION: potential d/c, check goals  Adalena Abdulla, OT 04/08/2022, 8:49 AM

## 2022-04-08 NOTE — Therapy (Signed)
OUTPATIENT PHYSICAL THERAPY LOWER EXTREMITY TREATMENT  Patient Name: Raymond Christensen MRN: [id number] DOB:19-Jun-1940, 82 y.o., male Today's Date: 04/08/2022  END OF SESSION:  PT End of Session - 04/08/22 0759     Visit Number 14    Date for PT Re-Evaluation 05/09/22    PT Start Time 0800    PT Stop Time 0845    PT Time Calculation (min) 45 min    Activity Tolerance Patient tolerated treatment well    Behavior During Therapy [facility] for tasks assessed/performed                 Past Medical History:  Diagnosis Date   Arthritis    CAD (coronary artery disease)    Carotid artery disease ([place])    s/p left CEA [id number]   Chronic systolic CHF (congestive heart failure) ([place])    Concussion Summer 2012   Essential hypertension 02/26/2017   Heart disease    Hyperlipidemia    Hypothyroidism 10/15/2017   Major depressive disorder    Mitral valve regurgitation    Myocardial infarction ([place]) 1996, 2021   Obstructive sleep apnea on CPAP    Persistent atrial fibrillation (HCC)    Poor flexibility of tendon 12/01/2018   S/P mitral valve clip implantation 12/23/2019   s/p TEER with MitraClip with one XTW by Dr. Burt Knack   Subungual hematoma of digit of hand 12/01/2018   Subungual hematoma of right foot 12/01/2018   Thyroid disease    Type 2 diabetes mellitus without complication, without long-term current use of insulin ([place]) 02/26/2017   Past Surgical History:  Procedure Laterality Date   ATRIAL FIBRILLATION ABLATION N/A 02/01/2020   Procedure: ATRIAL FIBRILLATION ABLATION;  Surgeon: [name], MD;  Location: [place] [facility];  Service: Cardiovascular;  Laterality: N/A;   BYPASS GRAFT  2001   CARDIAC CATHETERIZATION     CARDIOVERSION N/A 11/08/2019   Procedure: CARDIOVERSION;  Surgeon: [name], MD;  Location: [place];  Service: Cardiovascular;  Laterality: N/A;   CAROTID ENDARTERECTOMY Left 01/17/2014   CORONARY ANGIOPLASTY     CORONARY ARTERY BYPASS GRAFT      MITRAL VALVE REPAIR N/A 12/23/2019   Procedure: MITRAL VALVE REPAIR;  Surgeon: [name], MD;  Location: [place] [facility];  Service: Cardiovascular;  Laterality: N/A;   RIGHT/LEFT HEART CATH AND CORONARY/GRAFT ANGIOGRAPHY N/A 09/09/2019   Procedure: RIGHT/LEFT HEART CATH AND CORONARY/GRAFT ANGIOGRAPHY;  Surgeon: [name], MD;  Location: [place] [facility];  Service: Cardiovascular;  Laterality: N/A;   TEE WITHOUT CARDIOVERSION N/A 09/13/2019   Procedure: TRANSESOPHAGEAL ECHOCARDIOGRAM (TEE);  Surgeon: [name], MD;  Location: [facility];  Service: Cardiovascular;  Laterality: N/A;   TEE WITHOUT CARDIOVERSION N/A 12/23/2019   Procedure: TRANSESOPHAGEAL ECHOCARDIOGRAM (TEE);  Surgeon: [name], MD;  Location: [place] [facility];  Service: Cardiovascular;  Laterality: N/A;   TEE WITHOUT CARDIOVERSION  02/01/2020   Procedure: TRANSESOPHAGEAL ECHOCARDIOGRAM (TEE);  Surgeon: [name], MD;  Location: [place] [facility];  Service: Cardiovascular;;   TOTAL HIP ARTHROPLASTY  1994, 1995   Patient Active Problem List   Diagnosis Date Noted   Lipoma of right upper extremity 03/02/2021   Trigger little finger of left hand 09/29/2020   Secondary hypercoagulable state ([place]) 02/29/2020   Depression, major, single episode, moderate (HCC) 02/28/2020   Nonrheumatic tricuspid valve regurgitation    S/P mitral valve clip implantation 12/23/2019   Mitral valve disease 12/23/2019   Nonrheumatic mitral valve regurgitation  Persistent atrial fibrillation (HCC)    Chronic systolic CHF (congestive heart failure) (HCC)    Acute idiopathic gout involving toe of left foot    Thrombocytopenia ([place]) 09/08/2019   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 ([place])    Diabetes mellitus type 2 in obese ([place]) 06/22/2019   Major depressive disorder    Obstructive sleep apnea on CPAP    Hypothyroidism 10/15/2017   Subclavian artery  stenosis, right ([place]) 06/15/2017   Type 2 diabetes mellitus without complication, without long-term current use of insulin ([place]) 02/26/2017   Severe obesity (BMI 35.0-39.9) with comorbidity ([place]) 02/26/2017   Essential hypertension 02/26/2017   Memory change 01/10/2017   Urge incontinence of urine 01/10/2017   Lightheaded 06/25/2016   AK (actinic keratosis) 01/03/2016   H/O syncope 03/06/2014   Occlusion and stenosis of left carotid artery 01/11/2014   Routine history and physical examination of adult 11/08/2013   History of repair of hip joint 04/20/2013   Hyperlipidemia 04/20/2013   Presence of artificial hip joint 04/20/2013   Vitamin D deficiency 06/18/2010   Concussion 01/24/2010   Elevated prostate specific antigen (PSA) 01/01/2007   Benign prostatic hyperplasia without urinary obstruction 04/22/2006   Osteoarthrosis 04/22/2006   Dyslipidemia 04/02/2006    PCP: Riki Sheer  REFERRING PROVIDER: Riki Sheer  REFERRING DIAG: M53.3- Sacroiliac dysfunction  THERAPY DIAG:  Muscle weakness (generalized)  History of falling  Difficulty in walking, not elsewhere classified  Rationale for Evaluation and Treatment: Rehabilitation  ONSET DATE: 12/26/21  SUBJECTIVE:   SUBJECTIVE STATEMENT: "Im doing all right"  PERTINENT HISTORY: Past medical history significant for coronary artery disease, type 2 diabetes mellitus, previous MI, chronic systolic CHF, persistent A-fib  Had a fall on 02/01/22- tripped in a grocery store  PAIN:  Are you having pain? Yes: NPRS scale: 0/10 Pain location: head, shoulder, hip Pain description: sharp if I press on it other than that it is just dull Aggravating factors: nothing specifically Relieving factors: medicine  PRECAUTIONS: None  WEIGHT BEARING RESTRICTIONS: No  FALLS:  Has patient fallen in last 6 months? Yes. Number of falls 2  LIVING ENVIRONMENT: Lives with: lives with their spouse Lives in:  House/apartment Stairs: No Has following equipment at home: Environmental consultant - 2 wheeled  OCCUPATION: Retired  PLOF: [facility]  PATIENT GOALS: I want to make sure I can become ambulatory again and get around without the walker  NEXT MD VISIT:   OBJECTIVE:   DIAGNOSTIC FINDINGS:  CT cervical spine:   No evidence of acute fracture or traumatic malalignment.  CT head:   1. No evidence of acute intracranial abnormality. 2. Stable chronic hypodense bilateral subdural collections. 3. Right forehead contusion.  IMPRESSION: Negative.  COGNITION: Overall cognitive status: Within functional limits for tasks assessed, some delay in response and wife reports some memory deficits    SENSATION: WFL   MUSCLE LENGTH: Hamstrings:bilateral tightness, more in LLE  POSTURE: rounded shoulders, forward head, and flexed trunk   PALPATION: Tightness in bilateral UT, more in R side LOWER EXTREMITY ROM: grossly WFL some pain with R side hip flexion    LOWER EXTREMITY MMT: grossly 4/5    FUNCTIONAL TESTS:  5 times sit to stand: 21.31s with UE use Timed up and go (TUG): 22.27s  GAIT: Distance walked: in clinic distances Assistive device utilized: Environmental consultant - 2 wheeled Level of assistance: Modified independence Comments: shuffling feet, decreased stance time bilaterally, decrease stride length, poor foot clearance   TODAY'S TREATMENT:  DATE:   04/08/22 NuStep L 5 x 6 min 40lb Resisted gait all directions x 4 Alt cone taps 2x10 S2S OHP on airex 2x10 Standing march on airex 2x10 Side stepping on an off airex  Leg press 50lb 2x15  Passive progressive HS stetch   04/04/22 NuStep L5 x 6 min Bike L2.5  x 4 min S2S OHP on airex 2x10 On airex with ball toss Side stepping inside 6 in box, then with airex on one side of box HS curls 25lb 2x15 Leg Ext 10lb  2x10 Passive progressive HS stetch   04/02/22 Bike L3 x 6 min NuStep L4 x 4 min Side step and tandem walking on balance bean in // bars Side step on balance beam over object in // bars  Side step on and off airex  Passive progressive HS stetch  03/28/22 NuStep L5 x51mins Passive stretching of HS, piriformis, glutes, IT band  Knees to chest 30s  Trunk rotations 2x10 Bridges 2x10  SLS at counter 20s  Tandem standing at counter STS on airex Standing on airex cone taps 20 reps CGA req Standing on airex shoulder ext 10# 2x10    03/26/22 Reassess goals- TUG and 5xSTS BERG 46/56 Walking outdoors around [employer] NuStep L5 x76mins  HS stretch seated 30s  Knees to chest and piriformis stretch supine 30s Marching on airex Side steps on airex Walking on beam   03/21/22 NuStep L5 x 6 min Resisted side step over objets 20lb x5 each On airex ball toss  S2S OHP on airex 2x10 6in step ups from airex 2x5 each Side step on an off airex  Calf stretch with black bar  35lb hamstring curls 2x10 Leg Ext 10lb 2x10 Passive Hs and piriformis stretch.    03/19/22 NuStep L5 x 6 min Resisted gait 30lb 4 way x 4 each Shoulder Ext for core 10lb 2x10  On airex alt 6in box taps x10 S2S on airex 2x10 Alternate taps on cone on the floor x 10, mildly unsteady.  Side steps and tandem waling on balance beam in // bars Leg press 40# 3x12 HS self stretch   PATIENT EDUCATION:  Education details: POC and HEP Person educated: Patient and Spouse Education method: Explanation Education comprehension: verbalized understanding  HOME EXERCISE PROGRAM:  Access Code: [email or web address] URL: [email or web address] Date: 02/14/2022 Prepared by: Andris Baumann  Exercises - Sit to Stand with Arms Crossed  - 1 x daily - 7 x weekly - 2 sets - 10 reps - Standing March with Counter Support  - 1 x daily - 7 x weekly - 2 sets - 10 reps - Standing Hip Abduction with Unilateral Counter Support  - 1 x daily - 7 x  weekly - 2 sets - 10 reps - Standing with Head Rotation  - 1 x daily - 7 x weekly - 2 sets - 5 reps - 20 hold - Romberg Stance with Head Nods  - 1 x daily - 7 x weekly - 2 sets - 5 reps - 20 hold  ASSESSMENT:  CLINICAL IMPRESSION: Patient doing well overall, no reports of falls or stumbles over weekend. He demonstrates some instability side stepping on and off airex.  CGG needed with alt box taps. Pt tends to put more weight in heels when standing on airex. Continued to work on stretching as he is still very tight especially in hips and hamstrings.   OBJECTIVE IMPAIRMENTS: Abnormal gait, decreased balance, decreased mobility, difficulty walking, decreased strength, and pain.   ACTIVITY LIMITATIONS: bending,  stairs, transfers, bed mobility, and locomotion level  PARTICIPATION LIMITATIONS: driving, shopping, and yard work  [employer] POTENTIAL: Good  CLINICAL DECISION MAKING: Stable/uncomplicated  EVALUATION COMPLEXITY: Low   GOALS: Goals reviewed with patient? Yes  SHORT TERM GOALS: Target date: 03/28/22  Patient will be independent with initial HEP. Goal status: met  2.  Patient will demonstrate decreased fall risk by scoring < 15 sec on TUG. Baseline: 22.27s, 9.78s Goal status: MET  3.  Patient will be educated on strategies to decrease risk of falls.  Goal status: MET   LONG TERM GOALS: Target date: 05/09/22  Patient will be independent with advanced/ongoing HEP to improve outcomes and carryover.  Goal status: IN PROGRESS  2.  Patient will be able to ambulate 600' with LRAD with good safety to access community.  Baseline: using RW after fall Goal status: MET   3.  Patient will demonstrate improved functional LE strength as demonstrated by decreased 5xSTS to <14s. Baseline: 21.21s, 11.80s Goal status: MET   4.  Patient will score 46 on Berg Balance test to demonstrate lower risk of falls. (MCID= 8 points) .  Baseline: 38, 46 Goal status: MET  5. Patient will  demonstrate increased flexibility by being able to touch ankles   Baseline: mid shin  Goal status: INITIAL  6. Patient will demonstrate SLS at least 3 seconds or more bilaterally   Baseline: unable to do 03/26/22 >10s BLE 03/28/22  Goal status: MET   PLAN:  PT FREQUENCY: 1-2x/week  PT DURATION: 12 weeks  PLANNED INTERVENTIONS: Therapeutic exercises, Therapeutic activity, Neuromuscular re-education, Balance training, Gait training, Patient/Family education, Self Care, Joint mobilization, Stair training, Dry Needling, Electrical stimulation, Cryotherapy, Moist heat, Vasopneumatic device, Ionotophoresis 4mg /ml Dexamethasone, and Manual therapy  PLAN FOR NEXT SESSION: work on strength and balance and stretch!!  Andris Baumann, DPT 04/08/22 7:59 AM

## 2022-04-10 ENCOUNTER — Ambulatory Visit: Payer: Medicare (Managed Care)

## 2022-04-10 ENCOUNTER — Encounter: Payer: Self-pay | Admitting: Occupational Therapy

## 2022-04-10 ENCOUNTER — Ambulatory Visit: Payer: Medicare (Managed Care) | Admitting: Occupational Therapy

## 2022-04-10 DIAGNOSIS — R278 Other lack of coordination: Secondary | ICD-10-CM

## 2022-04-10 DIAGNOSIS — M6281 Muscle weakness (generalized): Secondary | ICD-10-CM | POA: Diagnosis not present

## 2022-04-10 DIAGNOSIS — R2689 Other abnormalities of gait and mobility: Secondary | ICD-10-CM

## 2022-04-10 DIAGNOSIS — R41844 Frontal lobe and executive function deficit: Secondary | ICD-10-CM

## 2022-04-10 DIAGNOSIS — R41840 Attention and concentration deficit: Secondary | ICD-10-CM

## 2022-04-10 NOTE — Therapy (Signed)
OUTPATIENT OCCUPATIONAL THERAPY NEURO Treatment  Patient Name: Raymond Christensen MRN: [id number] DOB:[birth year], 82 y.o., male Today's Date: 04/10/2022  PCP:Dr. Nani Ravens REFERRING PROVIDER: Dr. Nani Ravens  END OF SESSION:  OT End of Session - 04/10/22 1412     Visit Number 9    Number of Visits 17    Date for OT Re-Evaluation 05/06/22    Authorization Type generic Medicare    Authorization - Visit Number 9    Progress Note Due on Visit 10    OT Start Time 1407    OT Stop Time [id number]    OT Time Calculation (min) 38 min    Activity Tolerance Patient tolerated treatment well    Behavior During Therapy [facility] for tasks assessed/performed                  Past Medical History:  Diagnosis Date   Arthritis    CAD (coronary artery disease)    Carotid artery disease ([place])    s/p left CEA [id number]   Chronic systolic CHF (congestive heart failure) ([place])    Concussion Summer 2012   Essential hypertension 02/26/2017   Heart disease    Hyperlipidemia    Hypothyroidism 10/15/2017   Major depressive disorder    Mitral valve regurgitation    Myocardial infarction ([place]) 1996, 2021   Obstructive sleep apnea on CPAP    Persistent atrial fibrillation (HCC)    Poor flexibility of tendon 12/01/2018   S/P mitral valve clip implantation 12/23/2019   s/p TEER with MitraClip with one XTW by Dr. Burt Knack   Subungual hematoma of digit of hand 12/01/2018   Subungual hematoma of right foot 12/01/2018   Thyroid disease    Type 2 diabetes mellitus without complication, without long-term current use of insulin ([place]) 02/26/2017   Past Surgical History:  Procedure Laterality Date   ATRIAL FIBRILLATION ABLATION N/A 02/01/2020   Procedure: ATRIAL FIBRILLATION ABLATION;  Surgeon: [name], MD;  Location: [place] [facility];  Service: Cardiovascular;  Laterality: N/A;   BYPASS GRAFT  2001   CARDIAC CATHETERIZATION     CARDIOVERSION N/A 11/08/2019   Procedure: CARDIOVERSION;  Surgeon: [name], MD;  Location: [place];  Service: Cardiovascular;  Laterality: N/A;   CAROTID ENDARTERECTOMY Left 01/17/2014   CORONARY ANGIOPLASTY     CORONARY ARTERY BYPASS GRAFT     MITRAL VALVE REPAIR N/A 12/23/2019   Procedure: MITRAL VALVE REPAIR;  Surgeon: [name], MD;  Location: [place] [facility];  Service: Cardiovascular;  Laterality: N/A;   RIGHT/LEFT HEART CATH AND CORONARY/GRAFT ANGIOGRAPHY N/A 09/09/2019   Procedure: RIGHT/LEFT HEART CATH AND CORONARY/GRAFT ANGIOGRAPHY;  Surgeon: [name], MD;  Location: [place] [facility];  Service: Cardiovascular;  Laterality: N/A;   TEE WITHOUT CARDIOVERSION N/A 09/13/2019   Procedure: TRANSESOPHAGEAL ECHOCARDIOGRAM (TEE);  Surgeon: [name], MD;  Location: [facility];  Service: Cardiovascular;  Laterality: N/A;   TEE WITHOUT CARDIOVERSION N/A 12/23/2019   Procedure: TRANSESOPHAGEAL ECHOCARDIOGRAM (TEE);  Surgeon: [name], MD;  Location: [place] [facility];  Service: Cardiovascular;  Laterality: N/A;   TEE WITHOUT CARDIOVERSION  02/01/2020   Procedure: TRANSESOPHAGEAL ECHOCARDIOGRAM (TEE);  Surgeon: [name], MD;  Location: [place] [facility];  Service: Cardiovascular;;   TOTAL HIP ARTHROPLASTY  1994, 1995   Patient Active Problem List   Diagnosis Date Noted   Lipoma of right upper extremity 03/02/2021   Trigger little finger of left hand 09/29/2020   Secondary hypercoagulable state ([place]) 02/29/2020  Depression, major, single episode, moderate ([place]) 02/28/2020   Nonrheumatic tricuspid valve regurgitation    S/P mitral valve clip implantation 12/23/2019   Mitral valve disease 12/23/2019   Nonrheumatic mitral valve regurgitation    Persistent atrial fibrillation (HCC)    Chronic systolic CHF (congestive heart failure) (HCC)    Acute idiopathic gout involving toe of left foot    Thrombocytopenia ([place]) 09/08/2019   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 ([place])    Diabetes mellitus type 2 in obese ([place]) 06/22/2019   Major depressive disorder    Obstructive sleep apnea on CPAP    Hypothyroidism 10/15/2017   Subclavian artery stenosis, right ([place]) 06/15/2017   Type 2 diabetes mellitus without complication, without long-term current use of insulin ([place]) 02/26/2017   Severe obesity (BMI 35.0-39.9) with comorbidity ([place]) 02/26/2017   Essential hypertension 02/26/2017   Memory change 01/10/2017   Urge incontinence of urine 01/10/2017   Lightheaded 06/25/2016   AK (actinic keratosis) 01/03/2016   H/O syncope 03/06/2014   Occlusion and stenosis of left carotid artery 01/11/2014   Routine history and physical examination of adult 11/08/2013   History of repair of hip joint 04/20/2013   Hyperlipidemia 04/20/2013   Presence of artificial hip joint 04/20/2013   Vitamin D deficiency 06/18/2010   Concussion 01/24/2010   Elevated prostate specific antigen (PSA) 01/01/2007   Benign prostatic hyperplasia without urinary obstruction 04/22/2006   Osteoarthrosis 04/22/2006   Dyslipidemia 04/02/2006    ONSET DATE: 02/01/22  REFERRING DIAG:  R29.6 (ICD-10-CM) - Recurrent falls  S06.0X0A (ICD-10-CM) - Concussion without loss of consciousness, initial encounter    THERAPY DIAG:  Muscle weakness (generalized)  Other lack of coordination  Attention and concentration deficit  Frontal lobe and executive function deficit  Other abnormalities of gait and mobility  Rationale for Evaluation and Treatment: Rehabilitation  SUBJECTIVE:   SUBJECTIVE STATEMENT: Toe pain from injury  Pt accompanied by: self  PERTINENT HISTORY: Past medical history significant for coronary artery disease, type 2 diabetes mellitus, previous MI, chronic systolic CHF, persistent A-fib  Had a fall on 02/01/22- tripped in a grocery store   DIAGNOSTIC FINDINGS:  CT cervical spine:   No evidence of acute fracture or traumatic malalignment.   CT head:   1. No  evidence of acute intracranial abnormality. 2. Stable chronic hypodense bilateral subdural collections. 3. Right forehead contusion.     PAIN: PAIN:  Are you having pain? None at rest however with certain positions Yes: NPRS scale: 3/10 Pain location: right shoulder  Pain description: sharp Aggravating factors: malpositioning, slept wrong  Relieving factors: rest     PRECAUTIONS: Fall  WEIGHT BEARING RESTRICTIONS: No  PAIN:  Are you having pain? Yes: NPRS scale: 4/10 Pain location: toe Pain description: aching Aggravating factors: injury Relieving factors: sitting down     FALLS: Has patient fallen in last 6 months? Yes. Number of falls 2  LIVING ENVIRONMENT: Lives with: lives with their spouse Lives in: House/apartment Stairs: No Has following equipment at home:  built in shower bench  PLOF: Needs assistance with ADLs  PATIENT GOALS: increased ease with LB dressing  OBJECTIVE:   HAND DOMINANCE: Right  ADLs: Overall ADLs: needs assist with LB Transfers/ambulation related to ADLs:falls in grocery store Eating: mod I Grooming: mod I UB Dressing: mod I LB Dressing: needs help with socks Toileting: mod I Bathing: mod I Tub Shower transfers: mod I   IADLs: Shopping: needs assist, fell in grocery store  x 2 Light housekeeping: pt's wife is doing more currently Meal Prep: cooks meals mod I Community mobility: falls in walking in grocery Medication management: keeps up with own meds Financial management: pt reports he handles finances Handwriting: 90% legible  MOBILITY STATUS: Hx of falls      UPPER EXTREMITY ROM:  WFLS    UPPER EXTREMITY MMT:     MMT Right eval Left eval  Shoulder flexion 4/5 4+/5  Shoulder abduction    Shoulder adduction    Shoulder extension    Shoulder internal rotation    Shoulder external rotation    Middle trapezius    Lower trapezius    Elbow flexion 4+/5 4+/5  Elbow extension 4+/5 4+/5  Wrist flexion    Wrist  extension    Wrist ulnar deviation    Wrist radial deviation    Wrist pronation    Wrist supination    (Blank rows = not tested)  HAND FUNCTION: Grip strength: Right: 53 lbs; Left: 73 lbs  COORDINATION: 9 Hole Peg test: Right: 41.74 sec; Left: 33.53 sec Action tremor present SENSATION: WFL    COGNITION: Overall cognitive status: Impaired recall 2/3 items following short delay, may have delayed processing , cognition to be further addressed within a functional context   VISION: Subjective report: denies visual changes  Baseline vision: Wears glasses all the time   VISION ASSESSMENT: Not tested     OBSERVATIONS: action tremor bilateral UE's   TODAY'S TREATMENT:                                                                                                                              DATE:04/10/22- Arm bike x 8 mins level 4 for conditioning Therapist started checking progress towards goals. Pt would like to continue through next week.   Reviewed green theraband exercises for shoulder flexion, extension, biceps curls and triceps extension, 15 reps each, no shoulder pain. Grooved pegs for increased RUE fine motor coordination, min difficulty and increased time, removing with in hand manipulation. Gripper set at level 4 for sustained grip to pick up 1 inch blocks, min difficulty/ drops.     04/08/22-hot pack applied to right shoulder x 10 mins while pt performed the coordination activities below, no adverse reactions to heat . Copying small peg design with RUE while holding several pegs in R hand for in hand manipulation, min difficulty/ v.c. then removing with right and left hands alternately with in hand manipulation.  Seated closed chain shoulder flexion, then diagonals min v.c 10 reps each.  Activities for core stability and UE strength: Quadruped for cat and cow, then rocking forwards and back wads, then bird dog lifting alternate UE and LE's, min facilitation/  v.c Rolling medium ball up the wall with bilateral UE's. Pt attempted wall pushups however it aggravates his shoulder so discontinued. Arm bike x 8 mins level 4 for conditioning. Therapist discussed with pt bed positioning with RUE supported on pillow  if sleeping on left side to minimize pain.  Therapist discussed potential d/c next visit.     04/04/22-hot pack applied to right shoulder x 10 mins while pt performed the coordination activities below, no adverse reactions to heat . Holding coins in hand to stack then picking up stacks and manipulating in hand to place in container.  Flipping and dealing playing cards with right and LUE's, rotating card in hand, then twirling a pen  between fingers of R hand .  Supine closed chain chest press and shoulder flexion with PVC pipe frame, min v.c for shoulder/ scapular positioning, at least 15 reps each. Standing to perform bilateral shoulder extension/ scapular retraction with bilateral UE's green therabnad 10-15 reps then rowing, 10-15 reps min v.c Arm bike x 8 mins level 4 for conditioning.   04/02/22 Arm bike x 8 mins level 4 for conditioning. Pt reports L shoulder pain today. Therapist downgraded pt's theraband to green. Pt reviewed all exercises, shoulder flexion and abduction with green theraband were discontinued for the time being as they increase pt discomfort. Pt performed gentle closed chain shoulder flexion and with no increased pain.  Fine motor coordination activities: Copying small peg design with RUE, then removing with in hand manipulation, min v.c Rotating a single ball in hand then tossing between hands and then in each hand, min difficulty/ v.c  Gripper set at level 4 to pick up inch blocks for sustained grip, min difficulty/ drops, 1 rest break for 1/2 blocks.  Handwriting activity using foam grip, pt demonstrates improved legibility and letter size.     03/28/22- Arm bike x 8 mins level 3 for conditioning. Therapist  upgraded previously issued HEP to blue, 15 reps each bilateral UE's. Pt returned demonstration with min v.c  Therapist reviewed coordination HEP with patient and added stacking coins, manipulating coins and rotating 2 small balls in hand min v.c and demonstration for techniques and managing tremor.  3/12- Reviewed green theraband HEP 10 reps each, bilateral UE's today,  min v.c initially then pt returned demonstration.  Arm bike x 6 min level 3 for conditioning.  Gripper set at level 3 to pick up 1 inch blocks, resistance increased to level 4 to pick up second 1/2 of blocks, min difficulty/ drops, 1 rest break.  Grooved pegboard for increased fine motor coordination and in hand manipulation, min difficulty for in hand manipulation.  Handwriting activity using foam grip, pt printed the alphabet with v.c for positioning, and cues for letter size then pt printed several sentences with good legibility and letter size.      03/19/22- Pt practiced doffing then donning socks using reach, sock aide, min difficulty and increased time and min v.c Pt was provided with information for purchase.   Purdue pegboard for increased RUE fine motor coordination and in hand  manipulation, min difficulty/ v.c  Green theraband exercises 10-15 reps each, min v.c for proximal strengthening.  Memory compensation strategies, reviewed and handout issued     PATIENT EDUCATION 04/02/22- green  theraband HEP, shoulder abduction and flexion temporarily d/c due to pain. Person educated: Patient Education method: Explanation, Demonstration, Verbal cues, handout Education comprehension: verbalized understanding, returned demonstration, and verbal cues required   HOME EXERCISE PROGRAM: N/A   GOALS: Goals reviewed with patient? Yes  SHORT TERM GOALS: Target date: 04/08/22  I with HEP for coordination and grip strength Baseline: Goal status met HEP issued 03/14/22  2.  Pt will verbalize understanding of  compensatory strategies for short term memory  deficits. Baseline: recalls 2/3 items following short delay Goal status: met, pt verbalizes understanding. 03/26/22  3.  Pt will increase RUE grip strength by 5 lbs for increased functional use Baseline: Grip strength: Right: 53 lbs; Left: 73 lbs Goal status:met 83 lbs 04/10/22   LONG TERM GOALS: Target date: 05/06/22   2.  Pt will demonstrate improved fine motor coordination for ADLS as evidenced by decreasing 9 hole peg test by 3 secs for RUE. Baseline: 9 Hole Peg test: Right: 41.74 sec; Left: 33.53 sec Goal status:  ongoing- not consistent  43 secs, 39 secs  3.  Pt will donn socks mod I. Baseline: needs assist Goal status: met, pt purchased a sock aide and he is now donning his own socks, 03/26/22 4.  Pt will resume prior level of home management activities demonstrating good safety awareness. Baseline: pt's wife is currently performing. Goal status: progressing towards goals, pt simulated vacuming in clinic    ASSESSMENT:  CLINICAL IMPRESSION: Pt is progressing towards goals. Pt demonstrates improved strength and coordination. Therapist discussed potential d/c next visit with patient.PERFORMANCE DEFICITS: in functional skills including ADLs, IADLs, coordination, dexterity, tone, ROM, strength, pain, flexibility, Fine motor control, Gross motor control, mobility, balance, endurance, decreased knowledge of precautions, decreased knowledge of use of DME, vision, and UE functional use, cognitive skills including attention, memory, problem solving, and thought, and psychosocial skills including coping strategies, environmental adaptation, habits, interpersonal interactions, and routines and behaviors.   IMPAIRMENTS: are limiting patient from ADLs, IADLs, play, leisure, and social participation.   CO-MORBIDITIES: may have co-morbidities  that affects occupational performance. Patient will benefit from skilled OT to address above impairments and  improve overall function.  MODIFICATION OR ASSISTANCE TO COMPLETE EVALUATION: No modification of tasks or assist necessary to complete an evaluation.  OT OCCUPATIONAL PROFILE AND HISTORY: Problem focused assessment: Including review of records relating to presenting problem.  CLINICAL DECISION MAKING: LOW - limited treatment options, no task modification necessary  REHAB POTENTIAL: Good  EVALUATION COMPLEXITY: Low    PLAN:  OT FREQUENCY:2x/ week - resent to update frequency  OT DURATION: 8 weeks plus eval  PLANNED INTERVENTIONS: self care/ADL training, therapeutic exercise, therapeutic activity, neuromuscular re-education, manual therapy, passive range of motion, balance training, functional mobility training, moist heat, patient/family education, cognitive remediation/compensation, energy conservation, coping strategies training, and DME and/or AE instructions  RECOMMENDED OTHER SERVICES: PT  CONSULTED AND AGREED WITH PLAN OF CARE: Patient  PLAN FOR NEXT SESSION: fine motor coordination, UE strength, d/c next week  Mahina Salatino, OT 04/10/2022, 2:13 PM

## 2022-04-10 NOTE — Therapy (Signed)
OUTPATIENT PHYSICAL THERAPY LOWER EXTREMITY TREATMENT  Patient Name: Raymond Christensen MRN: [id number] DOB:21-Feb-1940, 82 y.o., male Today's Date: 04/10/2022  END OF SESSION:  PT End of Session - 04/10/22 1448     Visit Number 15    Date for PT Re-Evaluation 05/09/22    PT Start Time 1445    PT Stop Time [id number]    PT Time Calculation (min) 45 min    Activity Tolerance Patient tolerated treatment well    Behavior During Therapy [facility] for tasks assessed/performed                  Past Medical History:  Diagnosis Date   Arthritis    CAD (coronary artery disease)    Carotid artery disease ([place])    s/p left CEA [id number]   Chronic systolic CHF (congestive heart failure) ([place])    Concussion Summer 2012   Essential hypertension 02/26/2017   Heart disease    Hyperlipidemia    Hypothyroidism 10/15/2017   Major depressive disorder    Mitral valve regurgitation    Myocardial infarction ([place]) 1996, 2021   Obstructive sleep apnea on CPAP    Persistent atrial fibrillation (HCC)    Poor flexibility of tendon 12/01/2018   S/P mitral valve clip implantation 12/23/2019   s/p TEER with MitraClip with one XTW by Dr. Burt Knack   Subungual hematoma of digit of hand 12/01/2018   Subungual hematoma of right foot 12/01/2018   Thyroid disease    Type 2 diabetes mellitus without complication, without long-term current use of insulin ([place]) 02/26/2017   Past Surgical History:  Procedure Laterality Date   ATRIAL FIBRILLATION ABLATION N/A 02/01/2020   Procedure: ATRIAL FIBRILLATION ABLATION;  Surgeon: [name], MD;  Location: [place] [facility];  Service: Cardiovascular;  Laterality: N/A;   BYPASS GRAFT  2001   CARDIAC CATHETERIZATION     CARDIOVERSION N/A 11/08/2019   Procedure: CARDIOVERSION;  Surgeon: [name], MD;  Location: [place];  Service: Cardiovascular;  Laterality: N/A;   CAROTID ENDARTERECTOMY Left 01/17/2014   CORONARY ANGIOPLASTY     CORONARY ARTERY BYPASS GRAFT      MITRAL VALVE REPAIR N/A 12/23/2019   Procedure: MITRAL VALVE REPAIR;  Surgeon: [name], MD;  Location: [place] [facility];  Service: Cardiovascular;  Laterality: N/A;   RIGHT/LEFT HEART CATH AND CORONARY/GRAFT ANGIOGRAPHY N/A 09/09/2019   Procedure: RIGHT/LEFT HEART CATH AND CORONARY/GRAFT ANGIOGRAPHY;  Surgeon: [name], MD;  Location: [place] [facility];  Service: Cardiovascular;  Laterality: N/A;   TEE WITHOUT CARDIOVERSION N/A 09/13/2019   Procedure: TRANSESOPHAGEAL ECHOCARDIOGRAM (TEE);  Surgeon: [name], MD;  Location: [facility];  Service: Cardiovascular;  Laterality: N/A;   TEE WITHOUT CARDIOVERSION N/A 12/23/2019   Procedure: TRANSESOPHAGEAL ECHOCARDIOGRAM (TEE);  Surgeon: [name], MD;  Location: [place] [facility];  Service: Cardiovascular;  Laterality: N/A;   TEE WITHOUT CARDIOVERSION  02/01/2020   Procedure: TRANSESOPHAGEAL ECHOCARDIOGRAM (TEE);  Surgeon: [name], MD;  Location: [place] [facility];  Service: Cardiovascular;;   TOTAL HIP ARTHROPLASTY  1994, 1995   Patient Active Problem List   Diagnosis Date Noted   Lipoma of right upper extremity 03/02/2021   Trigger little finger of left hand 09/29/2020   Secondary hypercoagulable state ([place]) 02/29/2020   Depression, major, single episode, moderate (HCC) 02/28/2020   Nonrheumatic tricuspid valve regurgitation    S/P mitral valve clip implantation 12/23/2019   Mitral valve disease 12/23/2019   Nonrheumatic mitral valve regurgitation  Persistent atrial fibrillation (HCC)    Chronic systolic CHF (congestive heart failure) (HCC)    Acute idiopathic gout involving toe of left foot    Thrombocytopenia ([place]) 09/08/2019   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 ([place])    Diabetes mellitus type 2 in obese ([place]) 06/22/2019   Major depressive disorder    Obstructive sleep apnea on CPAP    Hypothyroidism 10/15/2017   Subclavian artery  stenosis, right ([place]) 06/15/2017   Type 2 diabetes mellitus without complication, without long-term current use of insulin ([place]) 02/26/2017   Severe obesity (BMI 35.0-39.9) with comorbidity ([place]) 02/26/2017   Essential hypertension 02/26/2017   Memory change 01/10/2017   Urge incontinence of urine 01/10/2017   Lightheaded 06/25/2016   AK (actinic keratosis) 01/03/2016   H/O syncope 03/06/2014   Occlusion and stenosis of left carotid artery 01/11/2014   Routine history and physical examination of adult 11/08/2013   History of repair of hip joint 04/20/2013   Hyperlipidemia 04/20/2013   Presence of artificial hip joint 04/20/2013   Vitamin D deficiency 06/18/2010   Concussion 01/24/2010   Elevated prostate specific antigen (PSA) 01/01/2007   Benign prostatic hyperplasia without urinary obstruction 04/22/2006   Osteoarthrosis 04/22/2006   Dyslipidemia 04/02/2006    PCP: Riki Sheer  REFERRING PROVIDER: Riki Sheer  REFERRING DIAG: M53.3- Sacroiliac dysfunction  THERAPY DIAG:  Muscle weakness (generalized)  Other lack of coordination  Other abnormalities of gait and mobility  Rationale for Evaluation and Treatment: Rehabilitation  ONSET DATE: 12/26/21  SUBJECTIVE:   SUBJECTIVE STATEMENT: Doing alright   PERTINENT HISTORY: Past medical history significant for coronary artery disease, type 2 diabetes mellitus, previous MI, chronic systolic CHF, persistent A-fib  Had a fall on 02/01/22- tripped in a grocery store  PAIN:  Are you having pain? Yes: NPRS scale: 0/10 Pain location: head, shoulder, hip Pain description: sharp if I press on it other than that it is just dull Aggravating factors: nothing specifically Relieving factors: medicine  PRECAUTIONS: None  WEIGHT BEARING RESTRICTIONS: No  FALLS:  Has patient fallen in last 6 months? Yes. Number of falls 2  LIVING ENVIRONMENT: Lives with: lives with their spouse Lives in: House/apartment Stairs:  No Has following equipment at home: Environmental consultant - 2 wheeled  OCCUPATION: Retired  PLOF: Independent  PATIENT GOALS: I want to make sure I can become ambulatory again and get around without the walker  NEXT MD VISIT:   OBJECTIVE:   DIAGNOSTIC FINDINGS:  CT cervical spine:   No evidence of acute fracture or traumatic malalignment.  CT head:   1. No evidence of acute intracranial abnormality. 2. Stable chronic hypodense bilateral subdural collections. 3. Right forehead contusion.  IMPRESSION: Negative.  COGNITION: Overall cognitive status: Within functional limits for tasks assessed, some delay in response and wife reports some memory deficits    SENSATION: WFL   MUSCLE LENGTH: Hamstrings:bilateral tightness, more in LLE  POSTURE: rounded shoulders, forward head, and flexed trunk   PALPATION: Tightness in bilateral UT, more in R side LOWER EXTREMITY ROM: grossly WFL some pain with R side hip flexion    LOWER EXTREMITY MMT: grossly 4/5    FUNCTIONAL TESTS:  5 times sit to stand: 21.31s with UE use Timed up and go (TUG): 22.27s  GAIT: Distance walked: in clinic distances Assistive device utilized: Environmental consultant - 2 wheeled Level of assistance: Modified independence Comments: shuffling feet, decreased stance time bilaterally, decrease stride length, poor foot clearance   TODAY'S TREATMENT:  DATE:  04/10/22 NuStep L5 x9mins Passive HS stretch 30s x2 each side  Bridges 2x10 S2S on airex 2x10 Walking beam Tandem standing on foam Cone taps standing on airex  Side steps on airex  Volleyball standing and then on airex    04/08/22 NuStep L 5 x 6 min 40lb Resisted gait all directions x 4 Alt cone taps 2x10 S2S OHP on airex 2x10 Standing march on airex 2x10 Side stepping on an off airex  Leg press 50lb 2x15  Passive progressive HS  stetch   04/04/22 NuStep L5 x 6 min Bike L2.5  x 4 min S2S OHP on airex 2x10 On airex with ball toss Side stepping inside 6 in box, then with airex on one side of box HS curls 25lb 2x15 Leg Ext 10lb 2x10 Passive progressive HS stetch   04/02/22 Bike L3 x 6 min NuStep L4 x 4 min Side step and tandem walking on balance bean in // bars Side step on balance beam over object in // bars  Side step on and off airex  Passive progressive HS stetch  03/28/22 NuStep L5 x53mins Passive stretching of HS, piriformis, glutes, IT band  Knees to chest 30s  Trunk rotations 2x10 Bridges 2x10  SLS at counter 20s  Tandem standing at counter STS on airex Standing on airex cone taps 20 reps CGA req Standing on airex shoulder ext 10# 2x10    03/26/22 Reassess goals- TUG and 5xSTS BERG 46/56 Walking outdoors around [employer] NuStep L5 x27mins  HS stretch seated 30s  Knees to chest and piriformis stretch supine 30s Marching on airex Side steps on airex Walking on beam   03/21/22 NuStep L5 x 6 min Resisted side step over objets 20lb x5 each On airex ball toss  S2S OHP on airex 2x10 6in step ups from airex 2x5 each Side step on an off airex  Calf stretch with black bar  35lb hamstring curls 2x10 Leg Ext 10lb 2x10 Passive Hs and piriformis stretch.    03/19/22 NuStep L5 x 6 min Resisted gait 30lb 4 way x 4 each Shoulder Ext for core 10lb 2x10  On airex alt 6in box taps x10 S2S on airex 2x10 Alternate taps on cone on the floor x 10, mildly unsteady.  Side steps and tandem waling on balance beam in // bars Leg press 40# 3x12 HS self stretch   PATIENT EDUCATION:  Education details: POC and HEP Person educated: Patient and Spouse Education method: Explanation Education comprehension: verbalized understanding  HOME EXERCISE PROGRAM:  Access Code: [email or web address] URL: [email or web address] Date: 02/14/2022 Prepared by: Andris Baumann  Exercises - Sit to Stand with Arms  Crossed  - 1 x daily - 7 x weekly - 2 sets - 10 reps - Standing March with Counter Support  - 1 x daily - 7 x weekly - 2 sets - 10 reps - Standing Hip Abduction with Unilateral Counter Support  - 1 x daily - 7 x weekly - 2 sets - 10 reps - Standing with Head Rotation  - 1 x daily - 7 x weekly - 2 sets - 5 reps - 20 hold - Romberg Stance with Head Nods  - 1 x daily - 7 x weekly - 2 sets - 5 reps - 20 hold  ASSESSMENT:  CLINICAL IMPRESSION: Patient doing well overall, no reports of falls or trips. He demonstrates some instability side stepping on and off airex.  Continued to work on stretching as he is still very tight especially in hips  and hamstrings. Pt tends to put more weight in heels when standing on airex, some difficulty with S2S from lower surface.    OBJECTIVE IMPAIRMENTS: Abnormal gait, decreased balance, decreased mobility, difficulty walking, decreased strength, and pain.   ACTIVITY LIMITATIONS: bending, stairs, transfers, bed mobility, and locomotion level  PARTICIPATION LIMITATIONS: driving, shopping, and yard work  [employer] POTENTIAL: Good  CLINICAL DECISION MAKING: Stable/uncomplicated  EVALUATION COMPLEXITY: Low   GOALS: Goals reviewed with patient? Yes  SHORT TERM GOALS: Target date: 03/28/22  Patient will be independent with initial HEP. Goal status: met  2.  Patient will demonstrate decreased fall risk by scoring < 15 sec on TUG. Baseline: 22.27s, 9.78s Goal status: MET  3.  Patient will be educated on strategies to decrease risk of falls.  Goal status: MET   LONG TERM GOALS: Target date: 05/09/22  Patient will be independent with advanced/ongoing HEP to improve outcomes and carryover.  Goal status: IN PROGRESS  2.  Patient will be able to ambulate 600' with LRAD with good safety to access community.  Baseline: using RW after fall Goal status: MET   3.  Patient will demonstrate improved functional LE strength as demonstrated by decreased 5xSTS to  <14s. Baseline: 21.21s, 11.80s Goal status: MET   4.  Patient will score 46 on Berg Balance test to demonstrate lower risk of falls. (MCID= 8 points) .  Baseline: 38, 46 Goal status: MET  5. Patient will demonstrate increased flexibility by being able to touch ankles   Baseline: mid shin  Goal status: INITIAL  6. Patient will demonstrate SLS at least 3 seconds or more bilaterally   Baseline: unable to do 03/26/22 >10s BLE 03/28/22  Goal status: MET   PLAN:  PT FREQUENCY: 1-2x/week  PT DURATION: 12 weeks  PLANNED INTERVENTIONS: Therapeutic exercises, Therapeutic activity, Neuromuscular re-education, Balance training, Gait training, Patient/Family education, Self Care, Joint mobilization, Stair training, Dry Needling, Electrical stimulation, Cryotherapy, Moist heat, Vasopneumatic device, Ionotophoresis 4mg /ml Dexamethasone, and Manual therapy  PLAN FOR NEXT SESSION: work on strength and balance and stretch!!  Andris Baumann, DPT 04/10/22 3:26 PM

## 2022-04-15 ENCOUNTER — Other Ambulatory Visit: Payer: Self-pay | Admitting: Family Medicine

## 2022-04-15 DIAGNOSIS — E669 Obesity, unspecified: Secondary | ICD-10-CM

## 2022-04-15 MED ORDER — OZEMPIC (1 MG/DOSE) 4 MG/3ML ~~LOC~~ SOPN
PEN_INJECTOR | SUBCUTANEOUS | 2 refills | Status: DC
Start: 2022-04-15 — End: 2022-05-03

## 2022-04-15 NOTE — Therapy (Unsigned)
OUTPATIENT OCCUPATIONAL THERAPY NEURO Treatment  Patient Name: Raymond Christensen MRN: [id number] DOB:1940/09/04, 82 y.o., male Today's Date: 04/16/2022  PCP:Dr. Nani Ravens REFERRING PROVIDER: Dr. Nani Ravens  END OF SESSION:  OT End of Session - 04/16/22 1349     Visit Number 10    Number of Visits 17    Date for OT Re-Evaluation 05/06/22    Authorization Type generic Medicare    Authorization - Visit Number 10    Progress Note Due on Visit 10    OT Start Time [id number]    OT Stop Time [id number]    OT Time Calculation (min) 40 min                   Past Medical History:  Diagnosis Date   Arthritis    CAD (coronary artery disease)    Carotid artery disease    s/p left CEA [id number]   Chronic systolic CHF (congestive heart failure)    Concussion Summer 2012   Essential hypertension 02/26/2017   Heart disease    Hyperlipidemia    Hypothyroidism 10/15/2017   Major depressive disorder    Mitral valve regurgitation    Myocardial infarction 1996, 2021   Obstructive sleep apnea on CPAP    Persistent atrial fibrillation    Poor flexibility of tendon 12/01/2018   S/P mitral valve clip implantation 12/23/2019   s/p TEER with MitraClip with one XTW by Dr. Burt Knack   Subungual hematoma of digit of hand 12/01/2018   Subungual hematoma of right foot 12/01/2018   Thyroid disease    Type 2 diabetes mellitus without complication, without long-term current use of insulin 02/26/2017   Past Surgical History:  Procedure Laterality Date   ATRIAL FIBRILLATION ABLATION N/A 02/01/2020   Procedure: ATRIAL FIBRILLATION ABLATION;  Surgeon: [name], MD;  Location: [place] [facility];  Service: Cardiovascular;  Laterality: N/A;   BYPASS GRAFT  2001   CARDIAC CATHETERIZATION     CARDIOVERSION N/A 11/08/2019   Procedure: CARDIOVERSION;  Surgeon: [name], MD;  Location: [place];  Service: Cardiovascular;  Laterality: N/A;   CAROTID ENDARTERECTOMY Left 01/17/2014   CORONARY ANGIOPLASTY      CORONARY ARTERY BYPASS GRAFT     MITRAL VALVE REPAIR N/A 12/23/2019   Procedure: MITRAL VALVE REPAIR;  Surgeon: [name], MD;  Location: [place] [facility];  Service: Cardiovascular;  Laterality: N/A;   RIGHT/LEFT HEART CATH AND CORONARY/GRAFT ANGIOGRAPHY N/A 09/09/2019   Procedure: RIGHT/LEFT HEART CATH AND CORONARY/GRAFT ANGIOGRAPHY;  Surgeon: [name], MD;  Location: [place] [facility];  Service: Cardiovascular;  Laterality: N/A;   TEE WITHOUT CARDIOVERSION N/A 09/13/2019   Procedure: TRANSESOPHAGEAL ECHOCARDIOGRAM (TEE);  Surgeon: [name], MD;  Location: [facility];  Service: Cardiovascular;  Laterality: N/A;   TEE WITHOUT CARDIOVERSION N/A 12/23/2019   Procedure: TRANSESOPHAGEAL ECHOCARDIOGRAM (TEE);  Surgeon: [name], MD;  Location: [place] [facility];  Service: Cardiovascular;  Laterality: N/A;   TEE WITHOUT CARDIOVERSION  02/01/2020   Procedure: TRANSESOPHAGEAL ECHOCARDIOGRAM (TEE);  Surgeon: [name], MD;  Location: [place] [facility];  Service: Cardiovascular;;   TOTAL HIP ARTHROPLASTY  1994, 1995   Patient Active Problem List   Diagnosis Date Noted   Lipoma of right upper extremity 03/02/2021   Trigger little finger of left hand 09/29/2020   Secondary hypercoagulable state 02/29/2020   Depression, major, single episode, moderate 02/28/2020   Nonrheumatic tricuspid valve regurgitation    S/P mitral valve clip implantation 12/23/2019  Mitral valve disease 12/23/2019   Nonrheumatic mitral valve regurgitation    Persistent atrial fibrillation    Chronic systolic CHF (congestive heart failure)    Acute idiopathic gout involving toe of left foot    Thrombocytopenia 09/08/2019   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    Diabetes mellitus type 2 in obese 06/22/2019   Major depressive disorder    Obstructive sleep apnea on CPAP    Hypothyroidism 10/15/2017   Subclavian artery  stenosis, right 06/15/2017   Type 2 diabetes mellitus without complication, without long-term current use of insulin 02/26/2017   Severe obesity (BMI 35.0-39.9) with comorbidity 02/26/2017   Essential hypertension 02/26/2017   Memory change 01/10/2017   Urge incontinence of urine 01/10/2017   Lightheaded 06/25/2016   AK (actinic keratosis) 01/03/2016   H/O syncope 03/06/2014   Occlusion and stenosis of left carotid artery 01/11/2014   Routine history and physical examination of adult 11/08/2013   History of repair of hip joint 04/20/2013   Hyperlipidemia 04/20/2013   Presence of artificial hip joint 04/20/2013   Vitamin D deficiency 06/18/2010   Concussion 01/24/2010   Elevated prostate specific antigen (PSA) 01/01/2007   Benign prostatic hyperplasia without urinary obstruction 04/22/2006   Osteoarthrosis 04/22/2006   Dyslipidemia 04/02/2006    ONSET DATE: 02/01/22  REFERRING DIAG:  R29.6 (ICD-10-CM) - Recurrent falls  S06.0X0A (ICD-10-CM) - Concussion without loss of consciousness, initial encounter    THERAPY DIAG:  Muscle weakness (generalized)  Other lack of coordination  Other abnormalities of gait and mobility  Attention and concentration deficit  Frontal lobe and executive function deficit  Rationale for Evaluation and Treatment: Rehabilitation  SUBJECTIVE:   SUBJECTIVE STATEMENT: Pt reports he is doing well  Pt accompanied by: self  PERTINENT HISTORY: Past medical history significant for coronary artery disease, type 2 diabetes mellitus, previous MI, chronic systolic CHF, persistent A-fib  Had a fall on 02/01/22- tripped in a grocery store   DIAGNOSTIC FINDINGS:  CT cervical spine:   No evidence of acute fracture or traumatic malalignment.   CT head:   1. No evidence of acute intracranial abnormality. 2. Stable chronic hypodense bilateral subdural collections. 3. Right forehead contusion.     PAIN: PAIN:  Are you having pain? None at rest  however with certain positions Yes: NPRS scale: 3/10 Pain location: right shoulder  Pain description: sharp Aggravating factors: malpositioning, slept wrong  Relieving factors: rest     PRECAUTIONS: Fall  WEIGHT BEARING RESTRICTIONS: No  PAIN: no     FALLS: Has patient fallen in last 6 months? Yes. Number of falls 2  LIVING ENVIRONMENT: Lives with: lives with their spouse Lives in: House/apartment Stairs: No Has following equipment at home:  built in shower bench  PLOF: Needs assistance with ADLs  PATIENT GOALS: increased ease with LB dressing  OBJECTIVE:   HAND DOMINANCE: Right  ADLs: Overall ADLs: needs assist with LB Transfers/ambulation related to ADLs:falls in grocery store Eating: mod I Grooming: mod I UB Dressing: mod I LB Dressing: needs help with socks Toileting: mod I Bathing: mod I Tub Shower transfers: mod I   IADLs: Shopping: needs assist, fell in grocery store x 2 Light housekeeping: pt's wife is doing more currently Meal Prep: cooks meals mod I Community mobility: falls in walking in grocery Medication management: keeps up with own meds Financial management: pt reports he handles finances Handwriting: 90% legible  MOBILITY STATUS: Hx of falls  UPPER EXTREMITY ROM:  WFLS    UPPER EXTREMITY MMT:     MMT Right eval Left eval  Shoulder flexion 4/5 4+/5  Shoulder abduction    Shoulder adduction    Shoulder extension    Shoulder internal rotation    Shoulder external rotation    Middle trapezius    Lower trapezius    Elbow flexion 4+/5 4+/5  Elbow extension 4+/5 4+/5  Wrist flexion    Wrist extension    Wrist ulnar deviation    Wrist radial deviation    Wrist pronation    Wrist supination    (Blank rows = not tested)  HAND FUNCTION: Grip strength: Right: 53 lbs; Left: 73 lbs  COORDINATION: 9 Hole Peg test: Right: 41.74 sec; Left: 33.53 sec Action tremor present SENSATION: WFL    COGNITION: Overall cognitive  status: Impaired recall 2/3 items following short delay, may have delayed processing , cognition to be further addressed within a functional context   VISION: Subjective report: denies visual changes  Baseline vision: Wears glasses all the time   VISION ASSESSMENT: Not tested     OBSERVATIONS: action tremor bilateral UE's   TODAY'S TREATMENT:                                                                                                                              DATE:04/16/22- Arm bike x 8 mins level 4 for conditioning. Holding several grooved pegs in right hand to place into pegboard 1 at a time., min difficulty/ v.c  Removing pegs from peg board using pliers for sustained pinch with good accuracy Gripper set at level 4 for sustained grip , good performance today  Flipping and dealing playing cards, stacking and manipulating coins in hand, rotating ball then 2 stress balls in hand for increased fine motor coordination, min difficulty/ v.c except for mod difficult rotating stress balls.  04/10/22- Arm bike x 8 mins level 4 for conditioning Therapist started checking progress towards goals. Pt would like to continue through next week.   Reviewed green theraband exercises for shoulder flexion, extension, biceps curls and triceps extension, 15 reps each, no shoulder pain. Grooved pegs for increased RUE fine motor coordination, min difficulty and increased time, removing with in hand manipulation. Gripper set at level 4 for sustained grip to pick up 1 inch blocks, min difficulty/ drops.     04/08/22-hot pack applied to right shoulder x 10 mins while pt performed the coordination activities below, no adverse reactions to heat . Copying small peg design with RUE while holding several pegs in R hand for in hand manipulation, min difficulty/ v.c. then removing with right and left hands alternately with in hand manipulation.  Seated closed chain shoulder flexion, then diagonals min v.c 10  reps each.  Activities for core stability and UE strength: Quadruped for cat and cow, then rocking forwards and back wads, then bird dog lifting alternate UE and LE's, min facilitation/ v.c Rolling  medium ball up the wall with bilateral UE's. Pt attempted wall pushups however it aggravates his shoulder so discontinued. Arm bike x 8 mins level 4 for conditioning. Therapist discussed with pt bed positioning with RUE supported on pillow if sleeping on left side to minimize pain.  Therapist discussed potential d/c next visit.     04/04/22-hot pack applied to right shoulder x 10 mins while pt performed the coordination activities below, no adverse reactions to heat . Holding coins in hand to stack then picking up stacks and manipulating in hand to place in container.  Flipping and dealing playing cards with right and LUE's, rotating card in hand, then twirling a pen  between fingers of R hand .  Supine closed chain chest press and shoulder flexion with PVC pipe frame, min v.c for shoulder/ scapular positioning, at least 15 reps each. Standing to perform bilateral shoulder extension/ scapular retraction with bilateral UE's green therabnad 10-15 reps then rowing, 10-15 reps min v.c Arm bike x 8 mins level 4 for conditioning.        PATIENT EDUCATION See above Person educated: Patient Education method: Explanation, Demonstration, Verbal cues, handout Education comprehension: verbalized understanding, returned demonstration, and verbal cues required   HOME EXERCISE PROGRAM: N/A   GOALS: Goals reviewed with patient? Yes  SHORT TERM GOALS: Target date: 04/08/22  I with HEP for coordination and grip strength Baseline: Goal status met HEP issued 03/14/22  2.  Pt will verbalize understanding of compensatory strategies for short term memory deficits. Baseline: recalls 2/3 items following short delay Goal status: met, pt verbalizes understanding. 03/26/22  3.  Pt will increase RUE grip  strength by 5 lbs for increased functional use Baseline: Grip strength: Right: 53 lbs; Left: 73 lbs Goal status:met 83 lbs 04/10/22   LONG TERM GOALS: Target date: 05/06/22 I with HEP for coordination and grip strength Baseline: Goal status met HEP issued 03/14/22  2.  Pt will demonstrate improved fine motor coordination for ADLS as evidenced by decreasing 9 hole peg test by 3 secs for RUE. Baseline: 9 Hole Peg test: Right: 41.74 sec; Left: 33.53 sec Goal status:  ongoing- not consistent  43 secs, 39 secs  3.  Pt will donn socks mod I. Baseline: needs assist Goal status: met, pt purchased a sock aide and he is now donning his own socks, 03/26/22 4.  Pt will resume prior level of home management activities demonstrating good safety awareness. Baseline: pt's wife is currently performing. Goal status: progressing towards goals, pt simulated vacuming in clinic    ASSESSMENT:  CLINICAL IMPRESSION: For the reporting period of 03/11/22-04/16/22 pt has made good overall progress. He has achieved all short term goals and 2/4 long term goals. Pt can benefit from continued skilled occupational therapy to address coordination, strength and endurance to maximize pt's safety  and I with ADLs/ IADLs.Pt is progressing towards remaining goals and can benefit form 1 additional visit to reinforce HEP and unmet goals. PERFORMANCE DEFICITS: in functional skills including ADLs, IADLs, coordination, dexterity, tone, ROM, strength, pain, flexibility, Fine motor control, Gross motor control, mobility, balance, endurance, decreased knowledge of precautions, decreased knowledge of use of DME, vision, and UE functional use, cognitive skills including attention, memory, problem solving, and thought, and psychosocial skills including coping strategies, environmental adaptation, habits, interpersonal interactions, and routines and behaviors.   IMPAIRMENTS: are limiting patient from ADLs, IADLs, play, leisure, and social  participation.   CO-MORBIDITIES: may have co-morbidities  that affects occupational performance. Patient will benefit  from skilled OT to address above impairments and improve overall function.  MODIFICATION OR ASSISTANCE TO COMPLETE EVALUATION: No modification of tasks or assist necessary to complete an evaluation.  OT OCCUPATIONAL PROFILE AND HISTORY: Problem focused assessment: Including review of records relating to presenting problem.  CLINICAL DECISION MAKING: LOW - limited treatment options, no task modification necessary  REHAB POTENTIAL: Good  EVALUATION COMPLEXITY: Low    PLAN:  OT FREQUENCY:2x/ week - resent to update frequency  OT DURATION: 8 weeks plus eval  PLANNED INTERVENTIONS: self care/ADL training, therapeutic exercise, therapeutic activity, neuromuscular re-education, manual therapy, passive range of motion, balance training, functional mobility training, moist heat, patient/family education, cognitive remediation/compensation, energy conservation, coping strategies training, and DME and/or AE instructions  RECOMMENDED OTHER SERVICES: PT  CONSULTED AND AGREED WITH PLAN OF CARE: Patient  PLAN FOR NEXT SESSION:  check goals, d/c next visit Coltin Casher, OT 04/16/2022, 4:34 PM

## 2022-04-16 ENCOUNTER — Ambulatory Visit: Payer: Medicare (Managed Care) | Attending: Family Medicine | Admitting: Physical Therapy

## 2022-04-16 ENCOUNTER — Ambulatory Visit: Payer: Medicare (Managed Care) | Admitting: Occupational Therapy

## 2022-04-16 ENCOUNTER — Encounter: Payer: Self-pay | Admitting: Physical Therapy

## 2022-04-16 DIAGNOSIS — R2689 Other abnormalities of gait and mobility: Secondary | ICD-10-CM

## 2022-04-16 DIAGNOSIS — R296 Repeated falls: Secondary | ICD-10-CM | POA: Diagnosis present

## 2022-04-16 DIAGNOSIS — Z9181 History of falling: Secondary | ICD-10-CM

## 2022-04-16 DIAGNOSIS — R41844 Frontal lobe and executive function deficit: Secondary | ICD-10-CM | POA: Diagnosis present

## 2022-04-16 DIAGNOSIS — R41840 Attention and concentration deficit: Secondary | ICD-10-CM | POA: Diagnosis present

## 2022-04-16 DIAGNOSIS — R278 Other lack of coordination: Secondary | ICD-10-CM

## 2022-04-16 DIAGNOSIS — M6281 Muscle weakness (generalized): Secondary | ICD-10-CM

## 2022-04-16 DIAGNOSIS — R262 Difficulty in walking, not elsewhere classified: Secondary | ICD-10-CM | POA: Diagnosis present

## 2022-04-16 NOTE — Therapy (Signed)
OUTPATIENT PHYSICAL THERAPY LOWER EXTREMITY TREATMENT  Patient Name: Raymond Christensen MRN: [id number] DOB:05/15/1940, 82 y.o., male Today's Date: 04/16/2022  END OF SESSION:  PT End of Session - 04/16/22 1344     Visit Number 16    Date for PT Re-Evaluation 05/09/22    PT Start Time 1345    PT Stop Time 1430    PT Time Calculation (min) 45 min    Activity Tolerance Patient tolerated treatment well    Behavior During Therapy [facility] for tasks assessed/performed                  Past Medical History:  Diagnosis Date   Arthritis    CAD (coronary artery disease)    Carotid artery disease    s/p left CEA [id number]   Chronic systolic CHF (congestive heart failure)    Concussion Summer 2012   Essential hypertension 02/26/2017   Heart disease    Hyperlipidemia    Hypothyroidism 10/15/2017   Major depressive disorder    Mitral valve regurgitation    Myocardial infarction 1996, 2021   Obstructive sleep apnea on CPAP    Persistent atrial fibrillation    Poor flexibility of tendon 12/01/2018   S/P mitral valve clip implantation 12/23/2019   s/p TEER with MitraClip with one XTW by Dr. Burt Knack   Subungual hematoma of digit of hand 12/01/2018   Subungual hematoma of right foot 12/01/2018   Thyroid disease    Type 2 diabetes mellitus without complication, without long-term current use of insulin 02/26/2017   Past Surgical History:  Procedure Laterality Date   ATRIAL FIBRILLATION ABLATION N/A 02/01/2020   Procedure: ATRIAL FIBRILLATION ABLATION;  Surgeon: [name], MD;  Location: [place] [facility];  Service: Cardiovascular;  Laterality: N/A;   BYPASS GRAFT  2001   CARDIAC CATHETERIZATION     CARDIOVERSION N/A 11/08/2019   Procedure: CARDIOVERSION;  Surgeon: [name], MD;  Location: [place];  Service: Cardiovascular;  Laterality: N/A;   CAROTID ENDARTERECTOMY Left 01/17/2014   CORONARY ANGIOPLASTY     CORONARY ARTERY BYPASS GRAFT     MITRAL VALVE REPAIR N/A  12/23/2019   Procedure: MITRAL VALVE REPAIR;  Surgeon: [name], MD;  Location: [place] [facility];  Service: Cardiovascular;  Laterality: N/A;   RIGHT/LEFT HEART CATH AND CORONARY/GRAFT ANGIOGRAPHY N/A 09/09/2019   Procedure: RIGHT/LEFT HEART CATH AND CORONARY/GRAFT ANGIOGRAPHY;  Surgeon: [name], MD;  Location: [place] [facility];  Service: Cardiovascular;  Laterality: N/A;   TEE WITHOUT CARDIOVERSION N/A 09/13/2019   Procedure: TRANSESOPHAGEAL ECHOCARDIOGRAM (TEE);  Surgeon: [name], MD;  Location: [facility];  Service: Cardiovascular;  Laterality: N/A;   TEE WITHOUT CARDIOVERSION N/A 12/23/2019   Procedure: TRANSESOPHAGEAL ECHOCARDIOGRAM (TEE);  Surgeon: [name], MD;  Location: [place] [facility];  Service: Cardiovascular;  Laterality: N/A;   TEE WITHOUT CARDIOVERSION  02/01/2020   Procedure: TRANSESOPHAGEAL ECHOCARDIOGRAM (TEE);  Surgeon: [name], MD;  Location: [place] [facility];  Service: Cardiovascular;;   TOTAL HIP ARTHROPLASTY  1994, 1995   Patient Active Problem List   Diagnosis Date Noted   Lipoma of right upper extremity 03/02/2021   Trigger little finger of left hand 09/29/2020   Secondary hypercoagulable state 02/29/2020   Depression, major, single episode, moderate 02/28/2020   Nonrheumatic tricuspid valve regurgitation    S/P mitral valve clip implantation 12/23/2019   Mitral valve disease 12/23/2019   Nonrheumatic mitral valve regurgitation    Persistent atrial fibrillation  Chronic systolic CHF (congestive heart failure)    Acute idiopathic gout involving toe of left foot    Thrombocytopenia 09/08/2019   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    Diabetes mellitus type 2 in obese 06/22/2019   Major depressive disorder    Obstructive sleep apnea on CPAP    Hypothyroidism 10/15/2017   Subclavian artery stenosis, right 06/15/2017   Type 2 diabetes mellitus without  complication, without long-term current use of insulin 02/26/2017   Severe obesity (BMI 35.0-39.9) with comorbidity 02/26/2017   Essential hypertension 02/26/2017   Memory change 01/10/2017   Urge incontinence of urine 01/10/2017   Lightheaded 06/25/2016   AK (actinic keratosis) 01/03/2016   H/O syncope 03/06/2014   Occlusion and stenosis of left carotid artery 01/11/2014   Routine history and physical examination of adult 11/08/2013   History of repair of hip joint 04/20/2013   Hyperlipidemia 04/20/2013   Presence of artificial hip joint 04/20/2013   Vitamin D deficiency 06/18/2010   Concussion 01/24/2010   Elevated prostate specific antigen (PSA) 01/01/2007   Benign prostatic hyperplasia without urinary obstruction 04/22/2006   Osteoarthrosis 04/22/2006   Dyslipidemia 04/02/2006    PCP: Riki Sheer  REFERRING PROVIDER: Riki Sheer  REFERRING DIAG: M53.3- Sacroiliac dysfunction  THERAPY DIAG:  Muscle weakness (generalized)  Other lack of coordination  Other abnormalities of gait and mobility  History of falling  Difficulty in walking, not elsewhere classified  Rationale for Evaluation and Treatment: Rehabilitation  ONSET DATE: 12/26/21  SUBJECTIVE:   SUBJECTIVE STATEMENT: "Im just real stiiff"  PERTINENT HISTORY: Past medical history significant for coronary artery disease, type 2 diabetes mellitus, previous MI, chronic systolic CHF, persistent A-fib  Had a fall on 02/01/22- tripped in a grocery store  PAIN:  Are you having pain? Yes: NPRS scale: 3/10 Pain location: L hip Pain description: sharp if I press on it other than that it is just dull Aggravating factors: nothing specifically Relieving factors: medicine  PRECAUTIONS: None  WEIGHT BEARING RESTRICTIONS: No  FALLS:  Has patient fallen in last 6 months? Yes. Number of falls 2  LIVING ENVIRONMENT: Lives with: lives with their spouse Lives in: House/apartment Stairs: No Has following  equipment at home: Environmental consultant - 2 wheeled  OCCUPATION: Retired  PLOF: Independent  PATIENT GOALS: I want to make sure I can become ambulatory again and get around without the walker  NEXT MD VISIT:   OBJECTIVE:   DIAGNOSTIC FINDINGS:  CT cervical spine:   No evidence of acute fracture or traumatic malalignment.  CT head:   1. No evidence of acute intracranial abnormality. 2. Stable chronic hypodense bilateral subdural collections. 3. Right forehead contusion.  IMPRESSION: Negative.  COGNITION: Overall cognitive status: Within functional limits for tasks assessed, some delay in response and wife reports some memory deficits    SENSATION: WFL   MUSCLE LENGTH: Hamstrings:bilateral tightness, more in LLE  POSTURE: rounded shoulders, forward head, and flexed trunk   PALPATION: Tightness in bilateral UT, more in R side LOWER EXTREMITY ROM: grossly WFL some pain with R side hip flexion    LOWER EXTREMITY MMT: grossly 4/5    FUNCTIONAL TESTS:  5 times sit to stand: 21.31s with UE use Timed up and go (TUG): 22.27s  GAIT: Distance walked: in clinic distances Assistive device utilized: Environmental consultant - 2 wheeled Level of assistance: Modified independence Comments: shuffling feet, decreased stance time bilaterally, decrease stride length, poor foot clearance   TODAY'S TREATMENT:  DATE:  04/16/22 NuStep L 5 x 6 min  Gait outside around two islands on L side of back parking lot up ane down slope Floor transfer x2  Ball toss standing on black mat Alt box taps on black mat Side steps on black mat   Figure eights around comes on black mat   HS curls 35lb 2x10 Leg Ext 10lb 2x15   04/10/22 NuStep L5 x81mins Passive HS stretch 30s x2 each side  Bridges 2x10 S2S on airex 2x10 Walking beam Tandem standing on foam Cone taps standing on airex  Side steps  on airex  Volleyball standing and then on airex    04/08/22 NuStep L 5 x 6 min 40lb Resisted gait all directions x 4 Alt cone taps 2x10 S2S OHP on airex 2x10 Standing march on airex 2x10 Side stepping on an off airex  Leg press 50lb 2x15  Passive progressive HS stetch   04/04/22 NuStep L5 x 6 min Bike L2.5  x 4 min S2S OHP on airex 2x10 On airex with ball toss Side stepping inside 6 in box, then with airex on one side of box HS curls 25lb 2x15 Leg Ext 10lb 2x10 Passive progressive HS stetch   04/02/22 Bike L3 x 6 min NuStep L4 x 4 min Side step and tandem walking on balance bean in // bars Side step on balance beam over object in // bars  Side step on and off airex  Passive progressive HS stetch  03/28/22 NuStep L5 x10mins Passive stretching of HS, piriformis, glutes, IT band  Knees to chest 30s  Trunk rotations 2x10 Bridges 2x10  SLS at counter 20s  Tandem standing at counter STS on airex Standing on airex cone taps 20 reps CGA req Standing on airex shoulder ext 10# 2x10    03/26/22 Reassess goals- TUG and 5xSTS BERG 46/56 Walking outdoors around [employer] NuStep L5 x24mins  HS stretch seated 30s  Knees to chest and piriformis stretch supine 30s Marching on airex Side steps on airex Walking on beam   03/21/22 NuStep L5 x 6 min Resisted side step over objets 20lb x5 each On airex ball toss  S2S OHP on airex 2x10 6in step ups from airex 2x5 each Side step on an off airex  Calf stretch with black bar  35lb hamstring curls 2x10 Leg Ext 10lb 2x10 Passive Hs and piriformis stretch.    03/19/22 NuStep L5 x 6 min Resisted gait 30lb 4 way x 4 each Shoulder Ext for core 10lb 2x10  On airex alt 6in box taps x10 S2S on airex 2x10 Alternate taps on cone on the floor x 10, mildly unsteady.  Side steps and tandem waling on balance beam in // bars Leg press 40# 3x12 HS self stretch   PATIENT EDUCATION:  Education details: POC and HEP Person educated: Patient  and Spouse Education method: Explanation Education comprehension: verbalized understanding  HOME EXERCISE PROGRAM:  Access Code: [email or web address] URL: [email or web address] Date: 02/14/2022 Prepared by: Andris Baumann  Exercises - Sit to Stand with Arms Crossed  - 1 x daily - 7 x weekly - 2 sets - 10 reps - Standing March with Counter Support  - 1 x daily - 7 x weekly - 2 sets - 10 reps - Standing Hip Abduction with Unilateral Counter Support  - 1 x daily - 7 x weekly - 2 sets - 10 reps - Standing with Head Rotation  - 1 x daily - 7 x weekly - 2 sets - 5 reps -  20 hold - Romberg Stance with Head Nods  - 1 x daily - 7 x weekly - 2 sets - 5 reps - 20 hold  ASSESSMENT:  CLINICAL IMPRESSION: Patient doing well overall, no reports of falls or trips. He demonstrates some instability while standing on black mat doing interventions. Cue needed to control gait speed ambulation downhill. Increase ressitane tolerated with HA Curls. Pt able to complete floor transfer but was taxing on Pt. Pt tends to put more weight in heels when standing on mat.  OBJECTIVE IMPAIRMENTS: Abnormal gait, decreased balance, decreased mobility, difficulty walking, decreased strength, and pain.   ACTIVITY LIMITATIONS: bending, stairs, transfers, bed mobility, and locomotion level  PARTICIPATION LIMITATIONS: driving, shopping, and yard work  [employer] POTENTIAL: Good  CLINICAL DECISION MAKING: Stable/uncomplicated  EVALUATION COMPLEXITY: Low   GOALS: Goals reviewed with patient? Yes  SHORT TERM GOALS: Target date: 03/28/22  Patient will be independent with initial HEP. Goal status: met  2.  Patient will demonstrate decreased fall risk by scoring < 15 sec on TUG. Baseline: 22.27s, 9.78s Goal status: MET  3.  Patient will be educated on strategies to decrease risk of falls.  Goal status: MET   LONG TERM GOALS: Target date: 05/09/22  Patient will be independent with advanced/ongoing HEP to improve  outcomes and carryover.  Goal status: IN PROGRESS  2.  Patient will be able to ambulate 600' with LRAD with good safety to access community.  Baseline: using RW after fall Goal status: MET   3.  Patient will demonstrate improved functional LE strength as demonstrated by decreased 5xSTS to <14s. Baseline: 21.21s, 11.80s Goal status: MET   4.  Patient will score 46 on Berg Balance test to demonstrate lower risk of falls. (MCID= 8 points) .  Baseline: 38, 46 Goal status: MET  5. Patient will demonstrate increased flexibility by being able to touch ankles   Baseline: mid shin  Goal status: INITIAL  6. Patient will demonstrate SLS at least 3 seconds or more bilaterally   Baseline: unable to do 03/26/22 >10s BLE 03/28/22  Goal status: MET   PLAN:  PT FREQUENCY: 1-2x/week  PT DURATION: 12 weeks  PLANNED INTERVENTIONS: Therapeutic exercises, Therapeutic activity, Neuromuscular re-education, Balance training, Gait training, Patient/Family education, Self Care, Joint mobilization, Stair training, Dry Needling, Electrical stimulation, Cryotherapy, Moist heat, Vasopneumatic device, Ionotophoresis 4mg /ml Dexamethasone, and Manual therapy  PLAN FOR NEXT SESSION: work on strength and balance and stretch!!  Andris Baumann, DPT 04/16/22 1:44 PM

## 2022-04-18 ENCOUNTER — Ambulatory Visit: Payer: Medicare (Managed Care) | Admitting: Occupational Therapy

## 2022-04-18 ENCOUNTER — Encounter: Payer: Self-pay | Admitting: Physical Therapy

## 2022-04-18 ENCOUNTER — Ambulatory Visit: Payer: Medicare (Managed Care) | Admitting: Physical Therapy

## 2022-04-18 DIAGNOSIS — R41844 Frontal lobe and executive function deficit: Secondary | ICD-10-CM

## 2022-04-18 DIAGNOSIS — R2689 Other abnormalities of gait and mobility: Secondary | ICD-10-CM

## 2022-04-18 DIAGNOSIS — R278 Other lack of coordination: Secondary | ICD-10-CM

## 2022-04-18 DIAGNOSIS — R296 Repeated falls: Secondary | ICD-10-CM

## 2022-04-18 DIAGNOSIS — Z9181 History of falling: Secondary | ICD-10-CM

## 2022-04-18 DIAGNOSIS — M6281 Muscle weakness (generalized): Secondary | ICD-10-CM

## 2022-04-18 DIAGNOSIS — R262 Difficulty in walking, not elsewhere classified: Secondary | ICD-10-CM

## 2022-04-18 DIAGNOSIS — R41840 Attention and concentration deficit: Secondary | ICD-10-CM

## 2022-04-18 NOTE — Therapy (Signed)
OUTPATIENT OCCUPATIONAL THERAPY NEURO Treatment  Patient Name: Raymond Christensen MRN: [id number] DOB:August 23, 1940, 82 y.o., male Today's Date: 04/18/2022  PCP:Dr. Nani Ravens REFERRING PROVIDER: Dr. Nani Ravens OCCUPATIONAL THERAPY DISCHARGE SUMMARY    Current functional level related to goals / functional outcomes: Pt met all goals and demonstrates excellent overall progress.   Remaining deficits: Mildly decreased strength and coordination,    Education / Equipment: Pt was educated in HEP. He demonstrates understanding.   Patient agrees to discharge. Patient goals were met. Patient is being discharged due to meeting the stated rehab goals..    END OF SESSION:  OT End of Session - 04/18/22 1121     Visit Number 11    Number of Visits 17    Date for OT Re-Evaluation 05/06/22    Authorization Type generic Medicare    Authorization - Visit Number 11    Progress Note Due on Visit 10    OT Start Time 66    OT Stop Time 1125    OT Time Calculation (min) 23 min    Activity Tolerance Patient tolerated treatment well    Behavior During Therapy [facility] for tasks assessed/performed                    Past Medical History:  Diagnosis Date   Arthritis    CAD (coronary artery disease)    Carotid artery disease    s/p left CEA [id number]   Chronic systolic CHF (congestive heart failure)    Concussion Summer 2012   Essential hypertension 02/26/2017   Heart disease    Hyperlipidemia    Hypothyroidism 10/15/2017   Major depressive disorder    Mitral valve regurgitation    Myocardial infarction 1996, 2021   Obstructive sleep apnea on CPAP    Persistent atrial fibrillation    Poor flexibility of tendon 12/01/2018   S/P mitral valve clip implantation 12/23/2019   s/p TEER with MitraClip with one XTW by Dr. Burt Knack   Subungual hematoma of digit of hand 12/01/2018   Subungual hematoma of right foot 12/01/2018   Thyroid disease    Type 2 diabetes mellitus without complication, without  long-term current use of insulin 02/26/2017   Past Surgical History:  Procedure Laterality Date   ATRIAL FIBRILLATION ABLATION N/A 02/01/2020   Procedure: ATRIAL FIBRILLATION ABLATION;  Surgeon: [name], MD;  Location: [place] [facility];  Service: Cardiovascular;  Laterality: N/A;   BYPASS GRAFT  2001   CARDIAC CATHETERIZATION     CARDIOVERSION N/A 11/08/2019   Procedure: CARDIOVERSION;  Surgeon: [name], MD;  Location: [place];  Service: Cardiovascular;  Laterality: N/A;   CAROTID ENDARTERECTOMY Left 01/17/2014   CORONARY ANGIOPLASTY     CORONARY ARTERY BYPASS GRAFT     MITRAL VALVE REPAIR N/A 12/23/2019   Procedure: MITRAL VALVE REPAIR;  Surgeon: [name], MD;  Location: [place] [facility];  Service: Cardiovascular;  Laterality: N/A;   RIGHT/LEFT HEART CATH AND CORONARY/GRAFT ANGIOGRAPHY N/A 09/09/2019   Procedure: RIGHT/LEFT HEART CATH AND CORONARY/GRAFT ANGIOGRAPHY;  Surgeon: [name], MD;  Location: [place] [facility];  Service: Cardiovascular;  Laterality: N/A;   TEE WITHOUT CARDIOVERSION N/A 09/13/2019   Procedure: TRANSESOPHAGEAL ECHOCARDIOGRAM (TEE);  Surgeon: [name], MD;  Location: [facility];  Service: Cardiovascular;  Laterality: N/A;   TEE WITHOUT CARDIOVERSION N/A 12/23/2019   Procedure: TRANSESOPHAGEAL ECHOCARDIOGRAM (TEE);  Surgeon: [name], MD;  Location: [place] [facility];  Service: Cardiovascular;  Laterality: N/A;   TEE WITHOUT  CARDIOVERSION  02/01/2020   Procedure: TRANSESOPHAGEAL ECHOCARDIOGRAM (TEE);  Surgeon: [name], MD;  Location: [place] [facility];  Service: Cardiovascular;;   TOTAL HIP ARTHROPLASTY  1994, 1995   Patient Active Problem List   Diagnosis Date Noted   Lipoma of right upper extremity 03/02/2021   Trigger little finger of left hand 09/29/2020   Secondary hypercoagulable state 02/29/2020   Depression, major, single episode, moderate 02/28/2020   Nonrheumatic tricuspid  valve regurgitation    S/P mitral valve clip implantation 12/23/2019   Mitral valve disease 12/23/2019   Nonrheumatic mitral valve regurgitation    Persistent atrial fibrillation    Chronic systolic CHF (congestive heart failure)    Acute idiopathic gout involving toe of left foot    Thrombocytopenia 09/08/2019   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    Diabetes mellitus type 2 in obese 06/22/2019   Major depressive disorder    Obstructive sleep apnea on CPAP    Hypothyroidism 10/15/2017   Subclavian artery stenosis, right 06/15/2017   Type 2 diabetes mellitus without complication, without long-term current use of insulin 02/26/2017   Severe obesity (BMI 35.0-39.9) with comorbidity 02/26/2017   Essential hypertension 02/26/2017   Memory change 01/10/2017   Urge incontinence of urine 01/10/2017   Lightheaded 06/25/2016   AK (actinic keratosis) 01/03/2016   H/O syncope 03/06/2014   Occlusion and stenosis of left carotid artery 01/11/2014   Routine history and physical examination of adult 11/08/2013   History of repair of hip joint 04/20/2013   Hyperlipidemia 04/20/2013   Presence of artificial hip joint 04/20/2013   Vitamin D deficiency 06/18/2010   Concussion 01/24/2010   Elevated prostate specific antigen (PSA) 01/01/2007   Benign prostatic hyperplasia without urinary obstruction 04/22/2006   Osteoarthrosis 04/22/2006   Dyslipidemia 04/02/2006    ONSET DATE: 02/01/22  REFERRING DIAG:  R29.6 (ICD-10-CM) - Recurrent falls  S06.0X0A (ICD-10-CM) - Concussion without loss of consciousness, initial encounter    THERAPY DIAG:  Muscle weakness (generalized)  Other lack of coordination  Other abnormalities of gait and mobility  Attention and concentration deficit  Frontal lobe and executive function deficit  Rationale for Evaluation and Treatment: Rehabilitation  SUBJECTIVE:   SUBJECTIVE STATEMENT: Pt agrees with  d/c  Pt accompanied by: self  PERTINENT HISTORY: Past medical history significant for coronary artery disease, type 2 diabetes mellitus, previous MI, chronic systolic CHF, persistent A-fib  Had a fall on 02/01/22- tripped in a grocery store   DIAGNOSTIC FINDINGS:  CT cervical spine:   No evidence of acute fracture or traumatic malalignment.   CT head:   1. No evidence of acute intracranial abnormality. 2. Stable chronic hypodense bilateral subdural collections. 3. Right forehead contusion.     PAIN: PAIN:  Are you having pain? None at rest however with certain positions Yes: NPRS scale: 3/10 Pain location: right shoulder  Pain description: sharp Aggravating factors: malpositioning, slept wrong  Relieving factors: rest     PRECAUTIONS: Fall  WEIGHT BEARING RESTRICTIONS: No  PAIN: no     FALLS: Has patient fallen in last 6 months? Yes. Number of falls 2  LIVING ENVIRONMENT: Lives with: lives with their spouse Lives in: House/apartment Stairs: No Has following equipment at home:  built in shower bench  PLOF: Needs assistance with ADLs  PATIENT GOALS: increased ease with LB dressing  OBJECTIVE:   HAND DOMINANCE: Right  ADLs: Overall ADLs: needs assist with LB Transfers/ambulation related to [place] in grocery  store Eating: mod I Grooming: mod I UB Dressing: mod I LB Dressing: needs help with socks Toileting: mod I Bathing: mod I Tub Shower transfers: mod I   IADLs: Shopping: needs assist, fell in grocery store x 2 Light housekeeping: pt's wife is doing more currently Meal Prep: cooks meals mod I Community mobility: falls in walking in grocery Medication management: keeps up with own meds Financial management: pt reports he handles finances Handwriting: 90% legible  MOBILITY STATUS: Hx of falls      UPPER EXTREMITY ROM:  WFLS    UPPER EXTREMITY MMT:     MMT Right eval Left eval  Shoulder flexion 4/5 4+/5  Shoulder abduction     Shoulder adduction    Shoulder extension    Shoulder internal rotation    Shoulder external rotation    Middle trapezius    Lower trapezius    Elbow flexion 4+/5 4+/5  Elbow extension 4+/5 4+/5  Wrist flexion    Wrist extension    Wrist ulnar deviation    Wrist radial deviation    Wrist pronation    Wrist supination    (Blank rows = not tested)  HAND FUNCTION: Grip strength: Right: 53 lbs; Left: 73 lbs  COORDINATION: 9 Hole Peg test: Right: 41.74 sec; Left: 33.53 sec Action tremor present SENSATION: WFL    COGNITION: Overall cognitive status: Impaired recall 2/3 items following short delay, may have delayed processing , cognition to be further addressed within a functional context   VISION: Subjective report: denies visual changes  Baseline vision: Wears glasses all the time   VISION ASSESSMENT: Not tested     OBSERVATIONS: action tremor bilateral UE's   TODAY'S TREATMENT:                                                                                                                              DATE:4/4/24Arm bike x 8 mins level 4 for conditioning. Reviewed theraband exercises with green band 10 reps each, no reports of pain. Therapist checked progress towards goals. Pt agrees with d/c   04/16/22- Arm bike x 8 mins level 4 for conditioning. Holding several grooved pegs in right hand to place into pegboard 1 at a time., min difficulty/ v.c  Removing pegs from peg board using pliers for sustained pinch with good accuracy Gripper set at level 4 for sustained grip , good performance today  Flipping and dealing playing cards, stacking and manipulating coins in hand, rotating ball then 2 stress balls in hand for increased fine motor coordination, min difficulty/ v.c except for mod difficult rotating stress balls.  04/10/22- Arm bike x 8 mins level 4 for conditioning Therapist started checking progress towards goals. Pt would like to continue through next week.    Reviewed green theraband exercises for shoulder flexion, extension, biceps curls and triceps extension, 15 reps each, no shoulder pain. Grooved pegs for increased RUE fine motor coordination, min difficulty and increased time, removing with  in hand manipulation. Gripper set at level 4 for sustained grip to pick up 1 inch blocks, min difficulty/ drops.     04/08/22-hot pack applied to right shoulder x 10 mins while pt performed the coordination activities below, no adverse reactions to heat . Copying small peg design with RUE while holding several pegs in R hand for in hand manipulation, min difficulty/ v.c. then removing with right and left hands alternately with in hand manipulation.  Seated closed chain shoulder flexion, then diagonals min v.c 10 reps each.  Activities for core stability and UE strength: Quadruped for cat and cow, then rocking forwards and back wads, then bird dog lifting alternate UE and LE's, min facilitation/ v.c Rolling medium ball up the wall with bilateral UE's. Pt attempted wall pushups however it aggravates his shoulder so discontinued. Arm bike x 8 mins level 4 for conditioning. Therapist discussed with pt bed positioning with RUE supported on pillow if sleeping on left side to minimize pain.  Therapist discussed potential d/c next visit.     04/04/22-hot pack applied to right shoulder x 10 mins while pt performed the coordination activities below, no adverse reactions to heat . Holding coins in hand to stack then picking up stacks and manipulating in hand to place in container.  Flipping and dealing playing cards with right and LUE's, rotating card in hand, then twirling a pen  between fingers of R hand .  Supine closed chain chest press and shoulder flexion with PVC pipe frame, min v.c for shoulder/ scapular positioning, at least 15 reps each. Standing to perform bilateral shoulder extension/ scapular retraction with bilateral UE's green therabnad 10-15 reps  then rowing, 10-15 reps min v.c Arm bike x 8 mins level 4 for conditioning.        PATIENT EDUCATION See above Person educated: Patient Education method: Explanation, Demonstration, Verbal cues,  Education comprehension: verbalized understanding, returned demonstration, and verbal cues required   HOME EXERCISE PROGRAM: N/A   GOALS: Goals reviewed with patient? Yes  SHORT TERM GOALS: Target date: 04/08/22  I with HEP for coordination and grip strength Baseline: Goal status met HEP issued 03/14/22  2.  Pt will verbalize understanding of compensatory strategies for short term memory deficits. Baseline: recalls 2/3 items following short delay Goal status: met, pt verbalizes understanding. 03/26/22  3.  Pt will increase RUE grip strength by 5 lbs for increased functional use Baseline: Grip strength: Right: 53 lbs; Left: 73 lbs Goal status:met 83 lbs 04/10/22   LONG TERM GOALS: Target date: 05/06/22 I with HEP for coordination and grip strength Baseline: Goal status met HEP issued 03/14/22  2.  Pt will demonstrate improved fine motor coordination for ADLS as evidenced by decreasing 9 hole peg test by 3 secs for RUE. Baseline: 9 Hole Peg test: Right: 41.74 sec; Left: 33.53 sec Goal status:  met, 37.58 secs  3.  Pt will donn socks mod I. Baseline: needs assist Goal status: met, pt purchased a sock aide and he is now donning his own socks, 03/26/22 4.  Pt will resume prior level of home management activities demonstrating good safety awareness. Baseline: pt's wife is currently performing. Goal status:met, per pt report 04/18/22 ASSESSMENT:  CLINICAL IMPRESSION: Pt made good overall progress. He agrees with plans for d/c. PERFORMANCE DEFICITS: in functional skills including ADLs, IADLs, coordination, dexterity, tone, ROM, strength, pain, flexibility, Fine motor control, Gross motor control, mobility, balance, endurance, decreased knowledge of precautions, decreased knowledge of  use of DME, vision, and  UE functional use, cognitive skills including attention, memory, problem solving, and thought, and psychosocial skills including coping strategies, environmental adaptation, habits, interpersonal interactions, and routines and behaviors.   IMPAIRMENTS: are limiting patient from ADLs, IADLs, play, leisure, and social participation.   CO-MORBIDITIES: may have co-morbidities  that affects occupational performance. Patient will benefit from skilled OT to address above impairments and improve overall function.  MODIFICATION OR ASSISTANCE TO COMPLETE EVALUATION: No modification of tasks or assist necessary to complete an evaluation.  OT OCCUPATIONAL PROFILE AND HISTORY: Problem focused assessment: Including review of records relating to presenting problem.  CLINICAL DECISION MAKING: LOW - limited treatment options, no task modification necessary  REHAB POTENTIAL: Good  EVALUATION COMPLEXITY: Low    PLAN:  OT FREQUENCY:2x/ week - resent to update frequency  OT DURATION: 8 weeks plus eval  PLANNED INTERVENTIONS: self care/ADL training, therapeutic exercise, therapeutic activity, neuromuscular re-education, manual therapy, passive range of motion, balance training, functional mobility training, moist heat, patient/family education, cognitive remediation/compensation, energy conservation, coping strategies training, and DME and/or AE instructions  RECOMMENDED OTHER SERVICES: PT  CONSULTED AND AGREED WITH PLAN OF CARE: Patient  PLAN FOR NEXT SESSION:   discharge OT Ronie Barnhart, OT 04/18/2022, 11:22 AM

## 2022-04-18 NOTE — Therapy (Signed)
OUTPATIENT PHYSICAL THERAPY LOWER EXTREMITY TREATMENT  Patient Name: Raymond Christensen MRN: [id number] DOB:Jul 17, 1940, 82 y.o., male Today's Date: 04/18/2022  END OF SESSION:  PT End of Session - 04/18/22 1129     Visit Number 17    Date for PT Re-Evaluation 05/09/22    PT Start Time 1127    PT Stop Time 1210    PT Time Calculation (min) 43 min    Activity Tolerance Patient tolerated treatment well    Behavior During Therapy [facility] for tasks assessed/performed                  Past Medical History:  Diagnosis Date   Arthritis    CAD (coronary artery disease)    Carotid artery disease    s/p left CEA [id number]   Chronic systolic CHF (congestive heart failure)    Concussion Summer 2012   Essential hypertension 02/26/2017   Heart disease    Hyperlipidemia    Hypothyroidism 10/15/2017   Major depressive disorder    Mitral valve regurgitation    Myocardial infarction 1996, 2021   Obstructive sleep apnea on CPAP    Persistent atrial fibrillation    Poor flexibility of tendon 12/01/2018   S/P mitral valve clip implantation 12/23/2019   s/p TEER with MitraClip with one XTW by Dr. Burt Knack   Subungual hematoma of digit of hand 12/01/2018   Subungual hematoma of right foot 12/01/2018   Thyroid disease    Type 2 diabetes mellitus without complication, without long-term current use of insulin 02/26/2017   Past Surgical History:  Procedure Laterality Date   ATRIAL FIBRILLATION ABLATION N/A 02/01/2020   Procedure: ATRIAL FIBRILLATION ABLATION;  Surgeon: [name], MD;  Location: [place] [facility];  Service: Cardiovascular;  Laterality: N/A;   BYPASS GRAFT  2001   CARDIAC CATHETERIZATION     CARDIOVERSION N/A 11/08/2019   Procedure: CARDIOVERSION;  Surgeon: [name], MD;  Location: [place];  Service: Cardiovascular;  Laterality: N/A;   CAROTID ENDARTERECTOMY Left 01/17/2014   CORONARY ANGIOPLASTY     CORONARY ARTERY BYPASS GRAFT     MITRAL VALVE REPAIR N/A  12/23/2019   Procedure: MITRAL VALVE REPAIR;  Surgeon: [name], MD;  Location: [place] [facility];  Service: Cardiovascular;  Laterality: N/A;   RIGHT/LEFT HEART CATH AND CORONARY/GRAFT ANGIOGRAPHY N/A 09/09/2019   Procedure: RIGHT/LEFT HEART CATH AND CORONARY/GRAFT ANGIOGRAPHY;  Surgeon: [name], MD;  Location: [place] [facility];  Service: Cardiovascular;  Laterality: N/A;   TEE WITHOUT CARDIOVERSION N/A 09/13/2019   Procedure: TRANSESOPHAGEAL ECHOCARDIOGRAM (TEE);  Surgeon: [name], MD;  Location: [facility];  Service: Cardiovascular;  Laterality: N/A;   TEE WITHOUT CARDIOVERSION N/A 12/23/2019   Procedure: TRANSESOPHAGEAL ECHOCARDIOGRAM (TEE);  Surgeon: [name], MD;  Location: [place] [facility];  Service: Cardiovascular;  Laterality: N/A;   TEE WITHOUT CARDIOVERSION  02/01/2020   Procedure: TRANSESOPHAGEAL ECHOCARDIOGRAM (TEE);  Surgeon: [name], MD;  Location: [place] [facility];  Service: Cardiovascular;;   TOTAL HIP ARTHROPLASTY  1994, 1995   Patient Active Problem List   Diagnosis Date Noted   Lipoma of right upper extremity 03/02/2021   Trigger little finger of left hand 09/29/2020   Secondary hypercoagulable state 02/29/2020   Depression, major, single episode, moderate 02/28/2020   Nonrheumatic tricuspid valve regurgitation    S/P mitral valve clip implantation 12/23/2019   Mitral valve disease 12/23/2019   Nonrheumatic mitral valve regurgitation    Persistent atrial fibrillation  Chronic systolic CHF (congestive heart failure)    Acute idiopathic gout involving toe of left foot    Thrombocytopenia 09/08/2019   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    Diabetes mellitus type 2 in obese 06/22/2019   Major depressive disorder    Obstructive sleep apnea on CPAP    Hypothyroidism 10/15/2017   Subclavian artery stenosis, right 06/15/2017   Type 2 diabetes mellitus without  complication, without long-term current use of insulin 02/26/2017   Severe obesity (BMI 35.0-39.9) with comorbidity 02/26/2017   Essential hypertension 02/26/2017   Memory change 01/10/2017   Urge incontinence of urine 01/10/2017   Lightheaded 06/25/2016   AK (actinic keratosis) 01/03/2016   H/O syncope 03/06/2014   Occlusion and stenosis of left carotid artery 01/11/2014   Routine history and physical examination of adult 11/08/2013   History of repair of hip joint 04/20/2013   Hyperlipidemia 04/20/2013   Presence of artificial hip joint 04/20/2013   Vitamin D deficiency 06/18/2010   Concussion 01/24/2010   Elevated prostate specific antigen (PSA) 01/01/2007   Benign prostatic hyperplasia without urinary obstruction 04/22/2006   Osteoarthrosis 04/22/2006   Dyslipidemia 04/02/2006    PCP: Riki Sheer  REFERRING PROVIDER: Riki Sheer  REFERRING DIAG: M53.3- Sacroiliac dysfunction  THERAPY DIAG:  Muscle weakness (generalized)  Other lack of coordination  Other abnormalities of gait and mobility  Difficulty in walking, not elsewhere classified  History of falling  Repeated falls  Rationale for Evaluation and Treatment: Rehabilitation  ONSET DATE: 12/26/21  SUBJECTIVE:   SUBJECTIVE STATEMENT: "Pretty goog"  PERTINENT HISTORY: Past medical history significant for coronary artery disease, type 2 diabetes mellitus, previous MI, chronic systolic CHF, persistent A-fib  Had a fall on 02/01/22- tripped in a grocery store  PAIN:  Are you having pain? Yes: NPRS scale: 0/10 Pain location: L hip Pain description: sharp if I press on it other than that it is just dull Aggravating factors: nothing specifically Relieving factors: medicine  PRECAUTIONS: None  WEIGHT BEARING RESTRICTIONS: No  FALLS:  Has patient fallen in last 6 months? Yes. Number of falls 2  LIVING ENVIRONMENT: Lives with: lives with their spouse Lives in: House/apartment Stairs: No Has  following equipment at home: Environmental consultant - 2 wheeled  OCCUPATION: Retired  PLOF: Independent  PATIENT GOALS: I want to make sure I can become ambulatory again and get around without the walker  NEXT MD VISIT:   OBJECTIVE:   DIAGNOSTIC FINDINGS:  CT cervical spine:   No evidence of acute fracture or traumatic malalignment.  CT head:   1. No evidence of acute intracranial abnormality. 2. Stable chronic hypodense bilateral subdural collections. 3. Right forehead contusion.  IMPRESSION: Negative.  COGNITION: Overall cognitive status: Within functional limits for tasks assessed, some delay in response and wife reports some memory deficits    SENSATION: WFL   MUSCLE LENGTH: Hamstrings:bilateral tightness, more in LLE  POSTURE: rounded shoulders, forward head, and flexed trunk   PALPATION: Tightness in bilateral UT, more in R side LOWER EXTREMITY ROM: grossly WFL some pain with R side hip flexion    LOWER EXTREMITY MMT: grossly 4/5    FUNCTIONAL TESTS:  5 times sit to stand: 21.31s with UE use Timed up and go (TUG): 22.27s  GAIT: Distance walked: in clinic distances Assistive device utilized: Environmental consultant - 2 wheeled Level of assistance: Modified independence Comments: shuffling feet, decreased stance time bilaterally, decrease stride length, poor foot clearance   TODAY'S TREATMENT:  DATE:  04/18/22 NuStep L 5 x 6 min  6 in step ups form airex x10 each HHA x1 Alt 6 in step ups from airex x10 each Leg press 60lb 2x15 Shoulder Ext 10lb Rows 15lb 2x15 standing on BOSU Step ups on airex x10 HS Curls 35lb 2x15 Leg Ext 10lb 2x15 Passive HS stretch     04/16/22 NuStep L 5 x 6 min  Gait outside around two islands on L side of back parking lot up ane down slope Floor transfer x2  Diona Foley toss standing on black mat Alt box taps on black mat Side steps  on black mat   Figure eights around comes on black mat   HS curls 35lb 2x10 Leg Ext 10lb 2x15   04/10/22 NuStep L5 x68mins Passive HS stretch 30s x2 each side  Bridges 2x10 S2S on airex 2x10 Walking beam Tandem standing on foam Cone taps standing on airex  Side steps on airex  Volleyball standing and then on airex    04/08/22 NuStep L 5 x 6 min 40lb Resisted gait all directions x 4 Alt cone taps 2x10 S2S OHP on airex 2x10 Standing march on airex 2x10 Side stepping on an off airex  Leg press 50lb 2x15  Passive progressive HS stetch   04/04/22 NuStep L5 x 6 min Bike L2.5  x 4 min S2S OHP on airex 2x10 On airex with ball toss Side stepping inside 6 in box, then with airex on one side of box HS curls 25lb 2x15 Leg Ext 10lb 2x10 Passive progressive HS stetch   04/02/22 Bike L3 x 6 min NuStep L4 x 4 min Side step and tandem walking on balance bean in // bars Side step on balance beam over object in // bars  Side step on and off airex  Passive progressive HS stetch  03/28/22 NuStep L5 x76mins Passive stretching of HS, piriformis, glutes, IT band  Knees to chest 30s  Trunk rotations 2x10 Bridges 2x10  SLS at counter 20s  Tandem standing at counter STS on airex Standing on airex cone taps 20 reps CGA req Standing on airex shoulder ext 10# 2x10    03/26/22 Reassess goals- TUG and 5xSTS BERG 46/56 Walking outdoors around [employer] NuStep L5 x74mins  HS stretch seated 30s  Knees to chest and piriformis stretch supine 30s Marching on airex Side steps on airex Walking on beam   PATIENT EDUCATION:  Education details: POC and HEP Person educated: Patient and Spouse Education method: Explanation Education comprehension: verbalized understanding  HOME EXERCISE PROGRAM:  Access Code: [email or web address] URL: [email or web address] Date: 02/14/2022 Prepared by: Andris Baumann  Exercises - Sit to Stand with Arms Crossed  - 1 x daily - 7 x weekly - 2 sets - 10  reps - Standing March with Counter Support  - 1 x daily - 7 x weekly - 2 sets - 10 reps - Standing Hip Abduction with Unilateral Counter Support  - 1 x daily - 7 x weekly - 2 sets - 10 reps - Standing with Head Rotation  - 1 x daily - 7 x weekly - 2 sets - 5 reps - 20 hold - Romberg Stance with Head Nods  - 1 x daily - 7 x weekly - 2 sets - 5 reps - 20 hold  ASSESSMENT:  CLINICAL IMPRESSION: Patient doing well overall, HS still remain very tight, he can only reach to mid shin when bending over. HHA required with step ups from airex. Pt was hesitant to try step ups  without HHA. Postural cues required with standing shoulder Ext and rows. Decrease ROM with seated leg extensions.   OBJECTIVE IMPAIRMENTS: Abnormal gait, decreased balance, decreased mobility, difficulty walking, decreased strength, and pain.   ACTIVITY LIMITATIONS: bending, stairs, transfers, bed mobility, and locomotion level  PARTICIPATION LIMITATIONS: driving, shopping, and yard work  [employer] POTENTIAL: Good  CLINICAL DECISION MAKING: Stable/uncomplicated  EVALUATION COMPLEXITY: Low   GOALS: Goals reviewed with patient? Yes  SHORT TERM GOALS: Target date: 03/28/22  Patient will be independent with initial HEP. Goal status: met  2.  Patient will demonstrate decreased fall risk by scoring < 15 sec on TUG. Baseline: 22.27s, 9.78s Goal status: MET  3.  Patient will be educated on strategies to decrease risk of falls.  Goal status: MET   LONG TERM GOALS: Target date: 05/09/22  Patient will be independent with advanced/ongoing HEP to improve outcomes and carryover.  Goal status: IN PROGRESS  2.  Patient will be able to ambulate 600' with LRAD with good safety to access community.  Baseline: using RW after fall Goal status: MET   3.  Patient will demonstrate improved functional LE strength as demonstrated by decreased 5xSTS to <14s. Baseline: 21.21s, 11.80s Goal status: MET   4.  Patient will score 46 on Berg  Balance test to demonstrate lower risk of falls. (MCID= 8 points) .  Baseline: 38, 46 Goal status: MET  5. Patient will demonstrate increased flexibility by being able to touch ankles   Baseline: mid shin  Goal status: On going 04/18/22  6. Patient will demonstrate SLS at least 3 seconds or more bilaterally   Baseline: unable to do 03/26/22 >10s BLE 03/28/22  Goal status: MET   PLAN:  PT FREQUENCY: 1-2x/week  PT DURATION: 12 weeks  PLANNED INTERVENTIONS: Therapeutic exercises, Therapeutic activity, Neuromuscular re-education, Balance training, Gait training, Patient/Family education, Self Care, Joint mobilization, Stair training, Dry Needling, Electrical stimulation, Cryotherapy, Moist heat, Vasopneumatic device, Ionotophoresis 4mg /ml Dexamethasone, and Manual therapy  PLAN FOR NEXT SESSION: work on strength and balance and stretch!!  Andris Baumann, DPT 04/18/22 11:29 AM

## 2022-05-03 ENCOUNTER — Encounter: Payer: Self-pay | Admitting: Family Medicine

## 2022-05-03 ENCOUNTER — Ambulatory Visit: Payer: Medicare (Managed Care) | Admitting: Family Medicine

## 2022-05-03 VITALS — BP 108/70 | HR 62 | Temp 98.0°F | Ht 68.0 in | Wt 218.5 lb

## 2022-05-03 DIAGNOSIS — E1169 Type 2 diabetes mellitus with other specified complication: Secondary | ICD-10-CM | POA: Diagnosis not present

## 2022-05-03 DIAGNOSIS — Z7984 Long term (current) use of oral hypoglycemic drugs: Secondary | ICD-10-CM | POA: Diagnosis not present

## 2022-05-03 DIAGNOSIS — E669 Obesity, unspecified: Secondary | ICD-10-CM

## 2022-05-03 DIAGNOSIS — R5383 Other fatigue: Secondary | ICD-10-CM | POA: Diagnosis not present

## 2022-05-03 MED ORDER — SEMAGLUTIDE (2 MG/DOSE) 8 MG/3ML ~~LOC~~ SOPN
2.0000 mg | PEN_INJECTOR | SUBCUTANEOUS | 2 refills | Status: DC
Start: 2022-05-03 — End: 2022-12-17

## 2022-05-03 NOTE — Patient Instructions (Signed)
Give [place] 2-3 business days to get the results of your labs back.   Take 1 tab every other day for 5 pills and then stop the Abilify.  Keep the diet clean and stay active.  Let's stop the Lasix and potassium. You do not need to taper off of this one.  Let us know if you need anything.

## 2022-05-03 NOTE — Progress Notes (Signed)
Chief Complaint  Patient presents with   Fatigue    Subjective: Patient is a 82 y.o. male here for fatigue.  Over the past mo, has had more fatigue. Water intake is decent. Diet is improved/good. Limited exercise. Sleep is normally good, does not feel well rested. Sleeps throughout the day because of that- sleeps 9-12 hrs per day including naps. Started Abilify a few days prior to onset of s/s's. Saw psych who told him to continue Zoloft and this. He also notes it has not improved his mood.   Past Medical History:  Diagnosis Date   Arthritis    CAD (coronary artery disease)    Carotid artery disease    s/p left CEA 01/17/14   Chronic systolic CHF (congestive heart failure)    Concussion Summer 2012   Essential hypertension 02/26/2017   Heart disease    Hyperlipidemia    Hypothyroidism 10/15/2017   Major depressive disorder    Mitral valve regurgitation    Myocardial infarction 1996, 2021   Obstructive sleep apnea on CPAP    Persistent atrial fibrillation    Poor flexibility of tendon 12/01/2018   S/P mitral valve clip implantation 12/23/2019   s/p TEER with MitraClip with one XTW by Dr. Excell Seltzer   Subungual hematoma of digit of hand 12/01/2018   Subungual hematoma of right foot 12/01/2018   Thyroid disease    Type 2 diabetes mellitus without complication, without long-term current use of insulin 02/26/2017    Objective: BP 108/70 (BP Location: Right Arm, Patient Position: Sitting, Cuff Size: Normal)   Pulse 62   Temp 98 F (36.7 C) (Oral)   Ht  (1.727 m)   Wt 218 lb 8 oz (99.1 kg)   SpO2 99%   BMI 33.22 kg/m  General: Awake, appears stated age Heart: RRR, no LE edema Lungs: CTAB, no rales, wheezes or rhonchi. No accessory muscle use Psych: Age appropriate judgment and insight, normal affect and mood  Assessment and Plan: Fatigue, unspecified type - Plan: Comprehensive metabolic panel, CBC, TSH, T4, free  Type 2 diabetes mellitus with obesity - Plan: Hemoglobin A1c,  Semaglutide, 2 MG/DOSE, 8 MG/3ML SOPN  Adverse effect of medication. Will stop Abilify by having him take 1 tab every other day for 5 doses and then stop. Counseled on diet/exercise. Ck labs. Compliant w CPAP.  The patient voiced understanding and agreement to the plan.  Jilda Roche [place], DO 05/03/22  3:55 PM

## 2022-05-04 LAB — CBC
HCT: 38.6 % (ref 38.5–50.0)
Hemoglobin: 12.3 g/dL — ABNORMAL LOW (ref 13.2–17.1)
MCH: 27.7 pg (ref 27.0–33.0)
MCHC: 31.9 g/dL — ABNORMAL LOW (ref 32.0–36.0)
MCV: 86.9 fL (ref 80.0–100.0)
MPV: 12 fL (ref 7.5–12.5)
Platelets: 182 10*3/uL (ref 140–400)
RBC: 4.44 10*6/uL (ref 4.20–5.80)
RDW: 13.9 % (ref 11.0–15.0)
WBC: 9.9 10*3/uL (ref 3.8–10.8)

## 2022-05-04 LAB — TSH: TSH: 2.36 mIU/L (ref 0.40–4.50)

## 2022-05-04 LAB — COMPREHENSIVE METABOLIC PANEL
AG Ratio: 2 (calc) (ref 1.0–2.5)
ALT: 16 U/L (ref 9–46)
AST: 18 U/L (ref 10–35)
Albumin: 4.2 g/dL (ref 3.6–5.1)
Alkaline phosphatase (APISO): 66 U/L (ref 35–144)
BUN: 20 mg/dL (ref 7–25)
CO2: 25 mmol/L (ref 20–32)
Calcium: 9.4 mg/dL (ref 8.6–10.3)
Chloride: 105 mmol/L (ref 98–110)
Creat: 1.17 mg/dL (ref 0.70–1.22)
Globulin: 2.1 g/dL (calc) (ref 1.9–3.7)
Glucose, Bld: 74 mg/dL (ref 65–99)
Potassium: 4.3 mmol/L (ref 3.5–5.3)
Sodium: 141 mmol/L (ref 135–146)
Total Bilirubin: 0.4 mg/dL (ref 0.2–1.2)
Total Protein: 6.3 g/dL (ref 6.1–8.1)

## 2022-05-04 LAB — HEMOGLOBIN A1C
Hgb A1c MFr Bld: 5.8 % of total Hgb — ABNORMAL HIGH (ref <5.7)
Mean Plasma Glucose: 120 mg/dL
eAG (mmol/L): 6.6 mmol/L

## 2022-05-04 LAB — T4, FREE: Free T4: 0.9 ng/dL (ref 0.8–1.8)

## 2022-05-06 ENCOUNTER — Other Ambulatory Visit: Payer: Self-pay | Admitting: Family Medicine

## 2022-05-06 DIAGNOSIS — D649 Anemia, unspecified: Secondary | ICD-10-CM

## 2022-05-10 ENCOUNTER — Other Ambulatory Visit (INDEPENDENT_AMBULATORY_CARE_PROVIDER_SITE_OTHER): Payer: Medicare (Managed Care)

## 2022-05-10 DIAGNOSIS — D649 Anemia, unspecified: Secondary | ICD-10-CM

## 2022-05-10 LAB — CBC
HCT: 38.6 % — ABNORMAL LOW (ref 39.0–52.0)
Hemoglobin: 12.5 g/dL — ABNORMAL LOW (ref 13.0–17.0)
MCHC: 32.3 g/dL (ref 30.0–36.0)
MCV: 85.3 fl (ref 78.0–100.0)
Platelets: 164 10*3/uL (ref 150.0–400.0)
RBC: 4.52 Mil/uL (ref 4.22–5.81)
RDW: 15.3 % (ref 11.5–15.5)
WBC: 7.7 10*3/uL (ref 4.0–10.5)

## 2022-05-10 LAB — B12 AND FOLATE PANEL
Folate: >23.5 ng/mL (ref >5.9)
Vitamin B-12: 484 pg/mL (ref 211–911)

## 2022-05-11 LAB — IRON,TIBC AND FERRITIN PANEL
%SAT: 17 % (calc) — ABNORMAL LOW (ref 20–48)
Ferritin: 90 ng/mL (ref 24–380)
Iron: 49 ug/dL — ABNORMAL LOW (ref 50–180)
TIBC: 291 mcg/dL (calc) (ref 250–425)

## 2022-05-13 ENCOUNTER — Other Ambulatory Visit: Payer: Self-pay | Admitting: Family Medicine

## 2022-05-13 ENCOUNTER — Other Ambulatory Visit (INDEPENDENT_AMBULATORY_CARE_PROVIDER_SITE_OTHER): Payer: Medicare (Managed Care)

## 2022-05-13 DIAGNOSIS — D649 Anemia, unspecified: Secondary | ICD-10-CM

## 2022-05-14 ENCOUNTER — Other Ambulatory Visit: Payer: Self-pay | Admitting: Family Medicine

## 2022-05-14 DIAGNOSIS — D509 Iron deficiency anemia, unspecified: Secondary | ICD-10-CM

## 2022-05-14 LAB — URINALYSIS, MICROSCOPIC ONLY

## 2022-05-20 ENCOUNTER — Other Ambulatory Visit: Payer: Self-pay

## 2022-05-20 MED ORDER — EZETIMIBE 10 MG PO TABS: 10.0000 mg | ORAL_TABLET | Freq: Every day | ORAL | 0 refills | Status: DC

## 2022-05-21 ENCOUNTER — Encounter: Payer: Self-pay | Admitting: Family Medicine

## 2022-05-21 MED ORDER — SERTRALINE HCL 100 MG PO TABS: 100.0000 mg | ORAL_TABLET | Freq: Every day | ORAL | 0 refills | Status: DC

## 2022-05-29 ENCOUNTER — Ambulatory Visit: Payer: Medicare (Managed Care) | Admitting: Family Medicine

## 2022-05-29 ENCOUNTER — Encounter: Payer: Self-pay | Admitting: Family Medicine

## 2022-05-29 VITALS — BP 110/68 | HR 62 | Temp 97.9°F | Ht 68.0 in | Wt 218.4 lb

## 2022-05-29 DIAGNOSIS — D509 Iron deficiency anemia, unspecified: Secondary | ICD-10-CM

## 2022-05-29 DIAGNOSIS — R5383 Other fatigue: Secondary | ICD-10-CM

## 2022-05-29 DIAGNOSIS — H00012 Hordeolum externum right lower eyelid: Secondary | ICD-10-CM | POA: Diagnosis not present

## 2022-05-29 DIAGNOSIS — G4733 Obstructive sleep apnea (adult) (pediatric): Secondary | ICD-10-CM | POA: Diagnosis not present

## 2022-05-29 LAB — IBC + FERRITIN
Ferritin: 93.4 ng/mL (ref 22.0–322.0)
Iron: 63 ug/dL (ref 42–165)
Saturation Ratios: 19.4 % — ABNORMAL LOW (ref 20.0–50.0)
TIBC: 324.8 ug/dL (ref 250.0–450.0)
Transferrin: 232 mg/dL (ref 212.0–360.0)

## 2022-05-29 LAB — CBC
HCT: 40.6 % (ref 39.0–52.0)
Hemoglobin: 13 g/dL (ref 13.0–17.0)
MCHC: 32.2 g/dL (ref 30.0–36.0)
MCV: 85.9 fl (ref 78.0–100.0)
Platelets: 173 10*3/uL (ref 150.0–400.0)
RBC: 4.72 Mil/uL (ref 4.22–5.81)
RDW: 15.4 % (ref 11.5–15.5)
WBC: 8.3 10*3/uL (ref 4.0–10.5)

## 2022-05-29 MED ORDER — SERTRALINE HCL 100 MG PO TABS: 50.0000 mg | ORAL_TABLET | Freq: Every day | ORAL | 0 refills | Status: DC

## 2022-05-29 NOTE — Progress Notes (Signed)
Chief Complaint  Patient presents with   Fatigue    Subjective: Patient is a 82 y.o. male here for follow-up fatigue.  Patient was seen about a month ago for fatigue.  Labs were largely unremarkable with exception of iron deficiency anemia.  Follow-up urine testing did not show any bleeding and he was referred to gastroenterology.  He has not heard anything so far.  His last colonoscopy was normal and done in [place].  He has been taking iron supplements twice daily without any benefit.  Denies nausea, vomiting, or diarrhea.  The patient has a history of heart disease and has a follow-up with his heart doctor this Friday in 2 days.  He is wondering if there are any heart medications he is on that are contributing to his fatigue.  He has a history of anxiety.  He was stopped on his Abilify and neither he nor his wife have not noticed anything with his mood worsening.  Unfortunately, it did not help with his symptoms either.  He is currently on Zoloft 100 mg daily and is wondering if this is helpful.  He has a history of OSA on CPAP.  He has not been using it routinely.  He reports being tired even when he was using it.  His last sleep study was 5 years ago with the [facility] sleep team.  He has not seen them since then.  He has never had a CPAP titrated.  He does stay well-hydrated.  Diet is okay.  He does not exercise routinely.  He notes he has a stye of his right lower eyelid.  This started a week ago and is steadily improving.  No eye pain, drainage, or redness.  Past Medical History:  Diagnosis Date   Arthritis    CAD (coronary artery disease)    Carotid artery disease (HCC)    s/p left CEA 01/17/14   Chronic systolic CHF (congestive heart failure) (HCC)    Concussion Summer 2012   Essential hypertension 02/26/2017   Heart disease    Hyperlipidemia    Hypothyroidism 10/15/2017   Major depressive disorder    Mitral valve regurgitation    Myocardial infarction (HCC) 1996, 2021    Obstructive sleep apnea on CPAP    Persistent atrial fibrillation (HCC)    Poor flexibility of tendon 12/01/2018   S/P mitral valve clip implantation 12/23/2019   s/p TEER with MitraClip with one XTW by Dr. Excell Seltzer   Subungual hematoma of digit of hand 12/01/2018   Subungual hematoma of right foot 12/01/2018   Thyroid disease    Type 2 diabetes mellitus without complication, without long-term current use of insulin (HCC) 02/26/2017    Objective: BP 110/68 (BP Location: Left Arm, Patient Position: Sitting, Cuff Size: Normal)   Pulse 62   Temp 97.9 F (36.6 C) (Oral)   Ht 5\' 8"  (1.727 m)   Wt 218 lb 6 oz (99.1 kg)   SpO2 97%   BMI 33.20 kg/m  General: Awake, appears stated age Eyes: PERRLA, sclera white, small area of erythema and swelling on the right lower lid.  There is no TTP.  Nodularity noted over the conjunctive of the lower lid. Heart: RRR, 2+ pitting bilateral LE edema tapering at the proximal third of the tibia Lungs: CTAB, no rales, wheezes or rhonchi. No accessory muscle use Psych: Age appropriate judgment and insight, normal affect and mood  Assessment and Plan: Iron deficiency anemia, unspecified iron deficiency anemia type - Plan: CBC, IBC + Ferritin  Obstructive sleep apnea on CPAP - Plan: Ambulatory referral to Pulmonology  Fatigue, unspecified type  Hordeolum externum of right lower eyelid  Has been taking supplementation.  GI information provided for him to reach out and call.  Follow-up on labs. Recommended he wear his CPAP daily and we will place referral to the [facility] sleep team. We will wean down on his Zoloft from 100 mg daily to 50 mg daily.  He will send [place] a message in 1 month to let us know how he is doing.  Counseled on exercise. Artificial tears, warm compresses, massage towards the eyelash.  Send message if not improving. The patient voiced understanding and agreement to the plan.  I spent 40 minutes with the patient discussing the above plans in  addition to reviewing his chart on the same day of the visit.  Jilda Roche [place], DO 05/29/22  11:43 AM

## 2022-05-29 NOTE — Patient Instructions (Addendum)
Give [place] 2-3 business days to get the results of your labs back.   Aim to do some physical exertion for 150 minutes per week. This is typically divided into 5 days per week, 30 minutes per day. The activity should be enough to get your heart rate up. Anything is better than nothing if you have time constraints.  Please contact the GI team at: [phone]  Cut the Zoloft tab in half and take 1/2 tab daily for a month. Send me a message with how things are going at that point and we will go from there.   Wear your CPAP nightly.   If you do not hear anything about your referral in the next 1-2 weeks, call our office and ask for an update.  Artificial tears like Refresh and Systane may be used for comfort. OK to get generic version. Generally people use them every 2-4 hours, but you can use them as much as you want because there is no medication in it.  Warm compresses, massage towards the eyelashes.   Let us know if you need anything.

## 2022-05-31 ENCOUNTER — Ambulatory Visit: Payer: Medicare (Managed Care) | Attending: Internal Medicine | Admitting: Internal Medicine

## 2022-05-31 ENCOUNTER — Encounter: Payer: Self-pay | Admitting: Internal Medicine

## 2022-05-31 ENCOUNTER — Ambulatory Visit (INDEPENDENT_AMBULATORY_CARE_PROVIDER_SITE_OTHER): Payer: Medicare (Managed Care)

## 2022-05-31 VITALS — BP 118/68 | HR 64 | Ht 60.0 in | Wt 218.0 lb

## 2022-05-31 DIAGNOSIS — I10 Essential (primary) hypertension: Secondary | ICD-10-CM

## 2022-05-31 DIAGNOSIS — Z95818 Presence of other cardiac implants and grafts: Secondary | ICD-10-CM

## 2022-05-31 DIAGNOSIS — I25709 Atherosclerosis of coronary artery bypass graft(s), unspecified, with unspecified angina pectoris: Secondary | ICD-10-CM | POA: Diagnosis not present

## 2022-05-31 DIAGNOSIS — I5022 Chronic systolic (congestive) heart failure: Secondary | ICD-10-CM

## 2022-05-31 DIAGNOSIS — G4733 Obstructive sleep apnea (adult) (pediatric): Secondary | ICD-10-CM

## 2022-05-31 DIAGNOSIS — E039 Hypothyroidism, unspecified: Secondary | ICD-10-CM

## 2022-05-31 DIAGNOSIS — I771 Stricture of artery: Secondary | ICD-10-CM | POA: Diagnosis not present

## 2022-05-31 DIAGNOSIS — Z9889 Other specified postprocedural states: Secondary | ICD-10-CM

## 2022-05-31 DIAGNOSIS — I493 Ventricular premature depolarization: Secondary | ICD-10-CM | POA: Diagnosis not present

## 2022-05-31 DIAGNOSIS — I6522 Occlusion and stenosis of left carotid artery: Secondary | ICD-10-CM

## 2022-05-31 DIAGNOSIS — I340 Nonrheumatic mitral (valve) insufficiency: Secondary | ICD-10-CM

## 2022-05-31 NOTE — Progress Notes (Unsigned)
Applied a 7 day Zio XT monitor to patient in the office 

## 2022-05-31 NOTE — Patient Instructions (Addendum)
Medication Instructions:  Your physician has recommended you make the following change in your medication:   1) STOP fenofibrate (Tricor)  *If you need a refill on your cardiac medications before your next appointment, please call your pharmacy*  Lab Work: None ordered today.  Testing/Procedures: Your physician has requested that you have an echocardiogram. Echocardiography is a painless test that uses sound waves to create images of your heart. It provides your doctor with information about the size and shape of your heart and how well your heart's chambers and valves are working. This procedure takes approximately one hour. There are no restrictions for this procedure. Please do NOT wear cologne, perfume, aftershave, or lotions (deodorant is allowed). Please arrive 15 minutes prior to your appointment time.  Your physician has requested that you wear a Zio heart monitor for 7 days. Please allow 2 weeks after returning the heart monitor before our office calls you with the results.    Follow-Up: At [facility], you and your health needs are our priority.  As part of our continuing mission to provide you with exceptional heart care, we have created designated Provider Care Teams.  These Care Teams include your primary Cardiologist (physician) and Advanced Practice Providers (APPs -  Physician Assistants and Nurse Practitioners) who all work together to provide you with the care you need, when you need it.  Your next appointment:   3 month(s) with APP 1 year with Dr. Izora Ribas  The format for your next appointment:   In Person  Provider:   Jari Favre, PA-C, Robin Searing, NP, Eligha Bridegroom, NP, or Tereso Newcomer, PA-C     Then, Christell Constant, MD will plan to see you again in 1 year(s).{  Other Instructions ZIO XT- Long Term Monitor Instructions     Your physician has requested you wear a ZIO patch monitor for 7 days.  This is a single patch monitor. [employer] supplies  one patch monitor per enrollment. Additional  stickers are not available. Please do not apply patch if you will be having a Nuclear Stress Test,  Echocardiogram, Cardiac CT, MRI, or Chest Xray during the period you would be wearing the  monitor. The patch cannot be worn during these tests. You cannot remove and re-apply the  ZIO XT patch monitor.  Your ZIO patch monitor will be mailed 3 day USPS to your address on file. It may take 3-5 days  to receive your monitor after you have been enrolled.  Once you have received your monitor, please review the enclosed instructions. Your monitor  has already been registered assigning a specific monitor serial # to you.     Billing and Patient Assistance Program Information     We have supplied [employer] with any of your insurance information on file for billing purposes.  [employer] offers a sliding scale Patient Assistance Program for patients that do not have  insurance, or whose insurance does not completely cover the cost of the ZIO monitor.  You must apply for the Patient Assistance Program to qualify for this discounted rate.  To apply, please call [employer] at [phone], select option 4, select option 2, ask to apply for  Patient Assistance Program. Meredeth Ide will ask your household income, and how many people  are in your household. They will quote your out-of-pocket cost based on that information.  [employer] will also be able to set up a 51-month, interest-free payment plan if needed.     Applying the monitor     Shave  hair from upper left chest.  Hold abrader disc by orange tab. Rub abrader in 40 strokes over the upper left chest as  indicated in your monitor instructions.  Clean area with 4 enclosed alcohol pads. Let dry.  Apply patch as indicated in monitor instructions. Patch will be placed under collarbone on left  side of chest with arrow pointing upward.  Rub patch adhesive wings for 2 minutes. Remove white label marked "1". Remove the  white  label marked "2". Rub patch adhesive wings for 2 additional minutes.  While looking in a mirror, press and release button in center of patch. A small green light will  flash 3-4 times. This will be your only indicator that the monitor has been turned on.  Do not shower for the first 24 hours. You may shower after the first 24 hours.  Press the button if you feel a symptom. You will hear a small click. Record Date, Time and  Symptom in the Patient Logbook.  When you are ready to remove the patch, follow instructions on the last 2 pages of Patient  Logbook. Stick patch monitor onto the last page of Patient Logbook.  Place Patient Logbook in the blue and white box. Use locking tab on box and tape box closed  securely. The blue and white box has prepaid postage on it. Please place it in the mailbox as  soon as possible. Your physician should have your test results approximately 7 days after the  monitor has been mailed back to [facility].  Call [facility] Customer Care at [phone] if you have questions regarding  your ZIO XT patch monitor. Call them immediately if you see an orange light blinking on your  monitor.  If your monitor falls off in less than 4 days, contact our Monitor department at [phone].  If your monitor becomes loose or falls off after 4 days call [employer] at [phone] for  suggestions on securing your monitor.

## 2022-05-31 NOTE — Progress Notes (Signed)
Cardiology Office Note:    Date:  05/31/2022   ID:  Raymond Christensen, DOB December 11, 1940, MRN [id number]  PCP:  Raymond Dory, DO  Cardiologist:  Raymond Constant, MD   Referring MD: Raymond Christensen*   CC: Transition to new cardiologist   History of Present Illness:    Raymond Christensen is a 82 y.o. male with a hx of  AFib, elevated troponin and new reduced Ef 35-40% 08/2019, and mod to severe MR, LA thrombus on TEE. Recent PCI with stent SVG to Cfx. Past history of MI in 1994 c/b cardiac arrest s/p lytics and PTCA, CABG in 2001 (LIMA to LAD, SVG to OM-2, SVG to right PDA, SVG to D-2) with post-op AF, carotid stenosis s/p L CEA and right subclavian stenosis, diet-controlled DM, HTN, obesity, OSA on CPAP, mild cognitive impairment, and depression/mood disorder.  2021: H/O MitraClip 12/2019 2022: AF ablation 01/2020. Recent nuisance bleeding on Eliquis/Plavix with Plavix DC 08/2020.  Patient notes that he is doing a fair amount of fatigue, other wise is well.   Since last visit notes that he is relatively sedentary. Wife notes he naps more. There are no interval hospital/ED visit.   Hasn't been going to the gym, has been going to physical therapy (graduated two weeks ago).  Saw PCP and they are cutting his Zolft.  No chest pain or pressure .  No SOB/DOE and no PND/Orthopnea.   No palpitations or syncope (he has PACs and PVCs  .  PCP stopped lasix and potassium one month prior by PCP for lethargy slight swelling in the legs after.  No weight gain    Past Medical History:  Diagnosis Date   Arthritis    CAD (coronary artery disease)    Carotid artery disease (HCC)    s/p left CEA 01/17/14   Chronic systolic CHF (congestive heart failure) (HCC)    Concussion Summer 2012   Essential hypertension 02/26/2017   Heart disease    Hyperlipidemia    Hypothyroidism 10/15/2017   Major depressive disorder    Mitral valve regurgitation    Myocardial infarction (HCC) 1996, 2021    Obstructive sleep apnea on CPAP    Persistent atrial fibrillation (HCC)    Poor flexibility of tendon 12/01/2018   S/P mitral valve clip implantation 12/23/2019   s/p TEER with MitraClip with one XTW by Dr. Excell Christensen   Subungual hematoma of digit of hand 12/01/2018   Subungual hematoma of right foot 12/01/2018   Thyroid disease    Type 2 diabetes mellitus without complication, without long-term current use of insulin (HCC) 02/26/2017    Past Surgical History:  Procedure Laterality Date   ATRIAL FIBRILLATION ABLATION N/A 02/01/2020   Procedure: ATRIAL FIBRILLATION ABLATION;  Surgeon: Raymond Lemming, MD;  Location: [facility] INVASIVE CV LAB;  Service: Cardiovascular;  Laterality: N/A;   BYPASS GRAFT  2001   CARDIAC CATHETERIZATION     CARDIOVERSION N/A 11/08/2019   Procedure: CARDIOVERSION;  Surgeon: Raymond Stade, MD;  Location: [facility] ENDOSCOPY;  Service: Cardiovascular;  Laterality: N/A;   CAROTID ENDARTERECTOMY Left 01/17/2014   CORONARY ANGIOPLASTY     CORONARY ARTERY BYPASS GRAFT     MITRAL VALVE REPAIR N/A 12/23/2019   Procedure: MITRAL VALVE REPAIR;  Surgeon: Raymond Bollman, MD;  Location: [facility] INVASIVE CV LAB;  Service: Cardiovascular;  Laterality: N/A;   RIGHT/LEFT HEART CATH AND CORONARY/GRAFT ANGIOGRAPHY N/A 09/09/2019   Procedure: RIGHT/LEFT HEART CATH AND CORONARY/GRAFT ANGIOGRAPHY;  Surgeon: Raymond Gess, MD;  Location:  [facility] INVASIVE CV LAB;  Service: Cardiovascular;  Laterality: N/A;   TEE WITHOUT CARDIOVERSION N/A 09/13/2019   Procedure: TRANSESOPHAGEAL ECHOCARDIOGRAM (TEE);  Surgeon: Raymond Red, MD;  Location: [facility] ENDOSCOPY;  Service: Cardiovascular;  Laterality: N/A;   TEE WITHOUT CARDIOVERSION N/A 12/23/2019   Procedure: TRANSESOPHAGEAL ECHOCARDIOGRAM (TEE);  Surgeon: Raymond Bollman, MD;  Location: [facility] INVASIVE CV LAB;  Service: Cardiovascular;  Laterality: N/A;   TEE WITHOUT CARDIOVERSION  02/01/2020   Procedure: TRANSESOPHAGEAL ECHOCARDIOGRAM (TEE);  Surgeon:  Raymond Lemming, MD;  Location: [facility] INVASIVE CV LAB;  Service: Cardiovascular;;   TOTAL HIP ARTHROPLASTY  1994, 1995    Current Medications: Current Meds  Medication Sig   acetaminophen (TYLENOL) 500 MG tablet Take 1,000 mg by mouth every 6 (six) hours as needed for moderate pain or headache.   atorvastatin (LIPITOR) 80 MG tablet TAKE 1 TABLET DAILY   Cholecalciferol (VITAMIN D3) 250 MCG (10000 UT) capsule Take 10,000 Units by mouth daily.    ELIQUIS 5 MG TABS tablet TAKE 1 TABLET TWICE A DAY   ENTRESTO 24-26 MG TAKE 1 TABLET TWICE A DAY   ezetimibe (ZETIA) 10 MG tablet Take 1 tablet (10 mg total) by mouth daily.   famotidine (PEPCID) 20 MG tablet Take 1 tablet (20 mg total) by mouth daily.   levocetirizine (XYZAL) 5 MG tablet TAKE 1 TABLET EVERY EVENING   levothyroxine (SYNTHROID) 75 MCG tablet Take 1 tablet (75 mcg total) by mouth daily.   liothyronine (CYTOMEL) 5 MCG tablet TAKE 2 TABLETS DAILY   Multiple Vitamins-Minerals (MULTIVITAMIN ADULT EXTRA C PO) Take 1 tablet by mouth daily.    Semaglutide, 2 MG/DOSE, 8 MG/3ML SOPN Inject 2 mg as directed once a week.   sertraline (ZOLOFT) 100 MG tablet Take 0.5 tablets (50 mg total) by mouth daily.   [DISCONTINUED] fenofibrate (TRICOR) 145 MG tablet TAKE ONE TABLET BY MOUTH DAILY     Allergies:   Sulfa antibiotics   Social History   Socioeconomic History   Marital status: Married    Spouse name: Not on file   Number of children: 2   Years of education: 28   Highest education level: Doctorate  Occupational History   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  Tobacco Use   Smoking status: Former    Types: Cigarettes    Quit date: 1980    Years since quitting: 44.4   Smokeless tobacco: Never  Vaping Use   Vaping Use: Never used  Substance and Sexual Activity   Alcohol use: Yes    Alcohol/week: 4.0 standard drinks of alcohol    Types: 1 Cans of beer, 3 Standard drinks or equivalent per week    Comment: 1 drink 3 times per week   Drug use: No    Sexual activity: Yes  Other Topics Concern   Not on file  Social History Narrative   Pt is R handed   Lives in single story home with his wife, Raymond Christensen   Has 2 adult children   PhD in Biology   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 [employer]    Social Determinants of Health   Financial Resource Strain: Low Risk  (07/27/2021)   Overall Financial Resource Strain (CARDIA)    Difficulty of Paying Living Expenses: Not hard at all  Food Insecurity: No Food Insecurity (07/27/2021)   Hunger Vital Sign    Worried About Running Out of Food in the Last Year: Never true    Ran Out of Food in the Last Year: Never true  Transportation  Needs: No Transportation Needs (07/27/2021)   PRAPARE - Administrator, Civil Service (Medical): No    Lack of Transportation (Non-Medical): No  Physical Activity: Sufficiently Active (07/27/2021)   Exercise Vital Sign    Days of Exercise per Week: 7 days    Minutes of Exercise per Session: 30 min  Stress: No Stress Concern Present (07/27/2021)   [employer] of Occupational Health - Occupational Stress Questionnaire    Feeling of Stress : Not at all  Social Connections: Socially Integrated (07/27/2021)   Social Connection and Isolation Panel [NHANES]    Frequency of Communication with Friends and Family: More than three times a week    Frequency of Social Gatherings with Friends and Family: More than three times a week    Attends Religious Services: More than 4 times per year    Active Member of [employer] or Organizations: Yes    Attends Engineer, structural: More than 4 times per year    Marital Status: Married     Family History: The patient's family history includes Dementia in his father. There is no history of Cancer.  ROS:   Please see the history of present illness.    Denies orthopnea.  Denies neurological symptoms.  Urological procedure to improve urine flow.  All other systems reviewed and are negative.  EKGs/Labs/Other Studies  Reviewed:    The following studies were reviewed today:  Cardiac Studies & Procedures   CARDIAC CATHETERIZATION  CARDIAC CATHETERIZATION 12/23/2019  Narrative Successful transcatheter edge-to-edge mitral valve repair using a MitraClip XTW device, positioned A2/P2, reducing mitral regurgitation from 4+ to 1+.  Procedure complicated by transient bradycardic arrest likely precipitated by coronary air embolism, with prompt resuscitation and return to baseline hemodynamics/cardiac function  Recommendations: Continue clopidogrel, resume apixaban tomorrow morning, assess recovery in the post cardiac catheterization recovery area to determine bed placement in either a cardiac progressive care bed versus ICU   CARDIAC CATHETERIZATION  CARDIAC CATHETERIZATION 09/10/2019  Narrative Images from the original result were not included.   Mid LM to Dist LM lesion is 70% stenosed.  1st Mrg lesion is 75% stenosed.  2nd Mrg-1 lesion is 90% stenosed.  2nd Mrg-2 lesion is 95% stenosed.  Mid RCA to Dist RCA lesion is 100% stenosed.  Prox RCA to Mid RCA lesion is 90% stenosed.  2nd Diag lesion is 75% stenosed.  Origin to Prox Graft lesion between 2nd Mrg and RPDA is 99% stenosed.  Prox Graft to Mid Graft lesion between Ramus and 2nd Mrg is 75% stenosed.  A drug-eluting stent was successfully placed using a STENT RESOLUTE ONYX 3.0X18.  Post intervention, there is a 0% residual stenosis.  A drug-eluting stent was successfully placed.  Post intervention, there is a 0% residual stenosis.  Hemodynamic findings consistent with mitral valve regurgitation.  Minter Bargas is a 82 y.o. male   [id number] LOCATION:  FACILITY: [facility] PHYSICIAN: Nanetta Batty, M.D. Nov 21, 1940   DATE OF PROCEDURE:  09/09/2019  DATE OF DISCHARGE:     CARDIAC CATHETERIZATION / PCI DES SVG Om2-PDA    History obtained from chart review.Justyce Merfeld is a 82 y.o. male with a history of MI in 1994 c/b  cardiac arrest s/p lytics and PTCA, CABG in 2001 (LIMA to LAD, SVG to OM-2, SVG to right PDA, SVG to D-2) with post-op AF, carotid stenosis s/p L CEA and right subclavian stenosis, diet-controlled DM, HTN, obesity, OSA on CPAP, mild cognitive impairment, depression/mood disorder, and hypothyroidism. LV function seems to  be preserved based on available records. He is being seen today for the evaluation of 1 week of epigastric discomfort.  His troponins were elevated at 300.  His EKG showed atrial fibrillation.  2D echo revealed decline in his ejection fraction of 35% with moderate to severe MR.  Based on this was decided to proceed with right and left heart cath to further define his anatomy and physiology with anticipation of potentially needing transesophageal echocardiography to better characterize his mitral valve.   PROCEDURE DESCRIPTION:  The patient was brought to the second floor [facility] cath lab in the postabsorptive state. He was premedicated with IV Versed and fentanyl. His right groinwas prepped and shaved in usual sterile fashion. Xylocaine 1% was used for local anesthesia. A 5 French sheath was inserted into the right common femoral artery using standard Seldinger technique.  A 7 French sheath was inserted into the right common femoral vein.  5 French right left Judkins diagnostic catheters along with a a 5 French pigtail catheter were used for selective coronary angiography, selective vein graft and IMA graft angiography, obtaining left heart pressures and supravalvular aortography.  Isovue dye was used for the entirety of the case.  Retrograde aorta, ventricular and pullback pressures were recorded.  A 7 [place] balloontipped demolition Swan-Ganz catheter was advanced through the right heart chambers obtaining sequential pressures and blood samples for the determination of Fick and thermodilution cardiac outputs.  The patient received a total of 12,000 as of heparin with an ACT of  greater than 300.  He received Plavix 600 mg p.o. along with Pepcid 20 mg IV.  On the pigtail was used for the entirety of the intervention.  Retrograde or pressures monitored during the case.  Using a 6 [place] RCB guide catheter along with a 0.14 Prowater guidewire and a 2 mm x 12 mm balloon I was able to engage the ostium of the sequential vein graft and passed a wire across the tightest stenosis just beyond the first anastomosis to OM 2.  I predilated with a 2 mm x 12 mm balloon establishing antegrade flow.  I then placed a 3 mm x 18 mm long Medtronic Onyx resolute drug-eluting stent deployed at 14 atm (3.1 mm) resulting reduction of a 99% stenosis to 0% residual.  Following this I direct stented an area of an intraluminal filling defect that appeared to be calcium with a 2.75 mm x 16 mm long Synergy drug-eluting stent deployed at 14 atm (2.8 mm) resulting reduction of a 75% mid followed by 99% mid sequential SVG to OM 2 and PDA with excellent flow.  The patient tolerated procedure well.  The guidewire and catheter were removed and the sheaths were secured in place.  Impression Mr. Shiver has an occluded RCA with an occluded second diagonal branch vein graft at the aorta and high-grade disease in a sequential vein graft supplying the second obtuse marginal branch and PDA.  His filling pressures are fairly low.  I placed 2 sequential drug-eluting stents with excellent result.  It is unclear whether his MR is structural or functional but I suspect he may require a transesophageal echo to further evaluate.  The sheath will be removed once the ACT falls below 170 and pressure held.  He will be gently hydrated.  He will need to be treated with an oral anticoagulant for his A. fib as well as aspirin and Plavix for 30 days after which the aspirin can be discontinued.  He will need at least  12 months of Plavix and long-term oral anticoagulation.  Nanetta Batty. MD, [facility] 09/09/2019 11:41 AM  Findings Coronary  Findings Diagnostic  Dominance: Right  Left Main Mid LM to Dist LM lesion is 70% stenosed.  Left Anterior Descending  Second Diagonal Branch 2nd Diag lesion is 75% stenosed.  Left Circumflex  First Obtuse Marginal Branch 1st Mrg lesion is 75% stenosed.  Second Obtuse Marginal Branch 2nd Mrg-1 lesion is 90% stenosed. 2nd Mrg-2 lesion is 95% stenosed.  Right Coronary Artery Prox RCA to Mid RCA lesion is 90% stenosed. Mid RCA to Dist RCA lesion is 100% stenosed.  LIMA Graft To Dist LAD  Sequential Graft To Ramus, 2nd Mrg, RPDA Prox Graft to Mid Graft lesion between Ramus and 2nd Mrg  is 75% stenosed. Origin to Prox Graft lesion between 2nd Mrg and RPDA  is 99% stenosed.  Intervention  Prox Graft to Mid Graft lesion between Ramus and 2nd Mrg (Sequential Graft To Ramus, 2nd Mrg, RPDA) Stent Lesion crossed with guidewire. Pre-stent angioplasty was not performed. A drug-eluting stent was successfully placed. Stent does not overlap previously placed stentPost-stent angioplasty was not performed. Post-Intervention Lesion Assessment The intervention was successful. Pre-interventional TIMI flow is 3. Post-intervention TIMI flow is 3. No complications occurred at this lesion. There is a 0% residual stenosis post intervention.  Origin to Prox Graft lesion between 2nd Mrg and RPDA (Sequential Graft To Ramus, 2nd Mrg, RPDA) Stent Pre-stent angioplasty was performed. A drug-eluting stent was successfully placed using a STENT RESOLUTE ONYX 3.0X18. Stent strut is well apposed. Stent does not overlap previously placed stentPost-stent angioplasty was not performed. Post-Intervention Lesion Assessment The intervention was successful. Pre-interventional TIMI flow is 3. Post-intervention TIMI flow is 3. No complications occurred at this lesion. There is a 0% residual stenosis post intervention.     ECHOCARDIOGRAM  ECHOCARDIOGRAM COMPLETE 01/04/2021  Narrative ECHOCARDIOGRAM  REPORT    Patient Name:   SAMYAK GIELOW Date of Exam: 01/03/2021 Medical Rec #:  [id number]      Height:       68.0 in Accession #:    [id number]     Weight:       240.4 lb Date of Birth:  [birth year]      BSA:          2.210 m Patient Age:    80 years       BP:           154/77 mmHg Patient Gender: M              HR:           71 bpm. Exam Location:  [address]  Procedure: 2D Echo, Cardiac Doppler and Color Doppler  Indications:    Z98.890 s/p Mitral valve clip  History:        Patient has prior history of Echocardiogram examinations, most recent 01/24/2020. CHF, CAD, Arrythmias:Atrial Fibrillation; Risk Factors:Hypertension and Diabetes.  Mitral Valve: Mitra-Clip with one XTW valve is present in the mitral position. Procedure Date: 12/23/2019.  Sonographer:    Clearence Ped RCS Referring Phys: [id number] KATHRYN R THOMPSON  IMPRESSIONS   1. Left ventricular ejection fraction, by estimation, is 50 to 55%. The left ventricle has low normal function. The left ventricle has no regional wall motion abnormalities. Left ventricular diastolic parameters are indeterminate. 2. Right ventricular systolic function is normal. The right ventricular size is mildly enlarged. There is normal pulmonary artery systolic pressure. 3. Left atrial size was mild to moderately dilated. 4.  The mitral valve has been repaired/replaced. Mild mitral valve regurgitation. The mean mitral valve gradient is 2.0 mmHg. There is a Mitra-Clip with one XTW present in the mitral position. Procedure Date: 12/23/2019. 5. The aortic valve is grossly normal. There is mild calcification of the aortic valve. There is mild thickening of the aortic valve. Aortic valve regurgitation is trivial. Aortic valve sclerosis is present, with no evidence of aortic valve stenosis. 6. The inferior vena cava is normal in size with <50% respiratory variability, suggesting right atrial pressure of 8 mmHg.  Comparison(s): Prior images reviewed  side by side. Changes from prior study are noted. EF slightly improved on current study.  FINDINGS Left Ventricle: Left ventricular ejection fraction, by estimation, is 50 to 55%. The left ventricle has low normal function. The left ventricle has no regional wall motion abnormalities. The left ventricular internal cavity size was normal in size. There is no left ventricular hypertrophy. Left ventricular diastolic parameters are indeterminate.  Right Ventricle: The right ventricular size is mildly enlarged. Right vetricular wall thickness was not well visualized. Right ventricular systolic function is normal. There is normal pulmonary artery systolic pressure. The tricuspid regurgitant velocity is 2.05 m/s, and with an assumed right atrial pressure of 8 mmHg, the estimated right ventricular systolic pressure is 24.8 mmHg.  Left Atrium: Left atrial size was mild to moderately dilated.  Right Atrium: Right atrial size was normal in size.  Pericardium: There is no evidence of pericardial effusion.  Mitral Valve: The mitral valve has been repaired/replaced. Mild mitral valve regurgitation. There is a Mitra-Clip with one XTW present in the mitral position. Procedure Date: 12/23/2019. MV peak gradient, 5.9 mmHg. The mean mitral valve gradient is 2.0 mmHg.  Tricuspid Valve: The tricuspid valve is normal in structure. Tricuspid valve regurgitation is trivial. No evidence of tricuspid stenosis.  Aortic Valve: The aortic valve is grossly normal. There is mild calcification of the aortic valve. There is mild thickening of the aortic valve. Aortic valve regurgitation is trivial. Aortic regurgitation PHT measures 398 msec. Aortic valve sclerosis is present, with no evidence of aortic valve stenosis.  Pulmonic Valve: The pulmonic valve was not well visualized. Pulmonic valve regurgitation is trivial. No evidence of pulmonic stenosis.  Aorta: The aortic root and ascending aorta are structurally normal, with  no evidence of dilitation.  Venous: The inferior vena cava is normal in size with less than 50% respiratory variability, suggesting right atrial pressure of 8 mmHg.  IAS/Shunts: The atrial septum is grossly normal.   LEFT VENTRICLE PLAX 2D LVIDd:         5.40 cm   Diastology LVIDs:         3.90 cm   LV e' medial:    55.35 cm/s LV PW:         0.90 cm   LV E/e' medial:  2.0 LV IVS:        0.90 cm   LV e' lateral:   11.90 cm/s LVOT diam:     1.90 cm   LV E/e' lateral: 9.1 LV SV:         67 LV SV Index:   31 LVOT Area:     2.84 cm   RIGHT VENTRICLE RV Basal diam:  4.30 cm RV Mid diam:    3.30 cm RV S prime:     11.90 cm/s TAPSE (M-mode): 1.8 cm RVSP:           19.8 mmHg  LEFT ATRIUM  Index        RIGHT ATRIUM           Index LA diam:        4.70 cm 2.13 cm/m   RA Pressure: 3.00 mmHg LA Vol (A2C):   84.3 ml 38.15 ml/m  RA Area:     15.80 cm LA Vol (A4C):   52.5 ml 23.76 ml/m  RA Volume:   35.50 ml  16.06 ml/m LA Biplane Vol: 73.8 ml 33.40 ml/m AORTIC VALVE LVOT Vmax:   121.00 cm/s LVOT Vmean:  71.500 cm/s LVOT VTI:    0.238 m AI PHT:      398 msec  AORTA Ao Root diam: 3.30 cm Ao Asc diam:  3.40 cm  MITRAL VALVE                TRICUSPID VALVE MV Area (PHT): 2.75 cm     TR Peak grad:   16.8 mmHg MV Area VTI:   1.98 cm     TR Vmax:        205.00 cm/s MV Peak grad:  5.9 mmHg     Estimated RAP:  3.00 mmHg MV Mean grad:  2.0 mmHg     RVSP:           19.8 mmHg MV Vmax:       1.21 m/s MV Vmean:      63.5 cm/s    SHUNTS MV Decel Time: 276 msec     Systemic VTI:  0.24 m MV E velocity: 108.00 cm/s  Systemic Diam: 1.90 cm MV A velocity: 108.00 cm/s MV E/A ratio:  1.00  Raymond Red MD Electronically signed by Raymond Red MD Signature Date/Time: 01/04/2021/10:22:25 AM    Final   TEE  ECHO TEE 02/01/2020  Narrative TRANSESOPHOGEAL ECHO REPORT    Patient Name:   JOSYIAH LEO Date of Exam: 02/01/2020 Medical Rec #:  [id number]       Height:       68.0 in Accession #:    [id number]     Weight:       232.0 lb Date of Birth:  1940-05-25      BSA:          2.177 m Patient Age:    79 years       BP:           100/69 mmHg Patient Gender: M              HR:           81 bpm. Exam Location:  Inpatient  Procedure: Transesophageal Echo and 3D Echo  Indications:     Atrial fibrillation ablation  History:         Patient has prior history of Echocardiogram examinations, most recent 01/24/2020. CHF, CAD, Mitral Valve Disease, Arrythmias:Atrial Fibrillation; Risk Factors:Diabetes, Hypertension, Sleep Apnea and Former Smoker. S/P mitral valve clip implantation.  Sonographer:     Ross Ludwig RDCS (AE) Referring Phys:  [id number] WILL MARTIN CAMNITZ Diagnosing Phys: [name] MD  PROCEDURE: After discussion of the risks and benefits of a TEE, an informed consent was obtained from the patient. The transesophogeal probe was passed without difficulty through the esophogus of the patient. Sedation performed by different physician. The patient was monitored while under deep sedation. Anesthestetic sedation was provided intravenously by Anesthesiology: 140mg  of Propofol, 80mg  of Lidocaine. Image quality was good. The patient developed no complications during the procedure.  IMPRESSIONS   1. Left ventricular ejection fraction,  by estimation, is 40 to 45%. The left ventricle has mildly decreased function. The left ventricle demonstrates global hypokinesis. Left ventricular diastolic function could not be evaluated. 2. Right ventricular systolic function is mildly reduced. The right ventricular size is mildly enlarged. 3. Left atrial size was severely dilated. No left atrial/left atrial appendage thrombus was detected. 4. Right atrial size was moderately dilated. 5. The mitral valve has been repaired/replaced- stable MitraClip placement. Mild mitral valve regurgitation. 6. The aortic valve is tricuspid. There is mild thickening of  the aortic valve. Aortic valve regurgitation is mild. 7. There is mild (Grade II) plaque. 8. Evidence of atrial level shunting detected by color flow Doppler.  Comparison(s): A prior study was performed on 12/23/19. No LAA thrombus since last procedure; improved MR at present loading conditions.  FINDINGS Left Ventricle: Left ventricular ejection fraction, by estimation, is 40 to 45%. The left ventricle has mildly decreased function. The left ventricle demonstrates global hypokinesis. The left ventricular internal cavity size was normal in size. Left ventricular diastolic function could not be evaluated.  Right Ventricle: The right ventricular size is mildly enlarged. Right vetricular wall thickness was not assessed. Right ventricular systolic function is mildly reduced.  Left Atrium: Left atrial size was severely dilated. No left atrial/left atrial appendage thrombus was detected.  Right Atrium: Right atrial size was moderately dilated.  Pericardium: There is no evidence of pericardial effusion.  Mitral Valve: Mitra Clip stable positioning with mitral regurgitation. The mitral valve has been repaired/replaced. Mild mitral valve regurgitation. There is a Mitra-Clip present in the mitral position.  Tricuspid Valve: The tricuspid valve is normal in structure. Tricuspid valve regurgitation is mild.  Aortic Valve: The aortic valve is tricuspid. There is mild thickening of the aortic valve. Aortic valve regurgitation is mild.  Pulmonic Valve: The pulmonic valve was normal in structure. Pulmonic valve regurgitation is trivial.  Aorta: The aortic root and ascending aorta are structurally normal, with no evidence of dilitation. There is mild (Grade II) plaque.  IAS/Shunts: Evidence of atrial level shunting detected by color flow Doppler.    AORTA Ao Asc diam: 2.90 cm  [name] MD Electronically signed by [name] MD Signature Date/Time: 02/01/2020/12:33:15  PM    Final   MONITORS  LONG TERM MONITOR (3-14 DAYS) 01/25/2020  Narrative Max 127 bpm 03:54pm, 12/22 Min 38 bpm 04:54am, 12/22 Avg 68 bpm <1% PVCs 100% atrial flutter burden Triggered events associated with atrial flutter  Will Camnitz, MD            EKG:   05/31/22: SR with PVC and PACs  Recent Labs: 05/03/2022: ALT 16; BUN 20; Creat 1.17; Potassium 4.3; Sodium 141; TSH 2.36 05/29/2022: Hemoglobin 13.0; Platelets 173.0  Recent Lipid Panel    Component Value Date/Time   CHOL 113 10/08/2021 0840   CHOL 110 04/24/2020 1006   TRIG 164.0 (H) 10/08/2021 0840   HDL 35.10 (L) 10/08/2021 0840   HDL 42 04/24/2020 1006   CHOLHDL 3 10/08/2021 0840   VLDL 32.8 10/08/2021 0840   LDLCALC 45 10/08/2021 0840   LDLCALC 37 04/24/2020 1006   LDLDIRECT 65.0 08/31/2021 1012    Physical Exam:    VS:  BP 118/68   Pulse 64   Ht 5' (1.524 m)   Wt 218 lb (98.9 kg)   SpO2 97%   BMI 42.58 kg/m     Wt Readings from Last 3 Encounters:  05/31/22 218 lb (98.9 kg)  05/29/22 218 lb 6 oz (99.1 kg)  05/03/22 218 lb 8 oz (99.1 kg)     GEN: Obese. No acute distress HEENT: Normal NECK: No JVD. CARDIAC: Faint 1/6 apical systolic murmur. RRR no gallop, or edema. VASCULAR:  Normal Pulses. No bruits. RESPIRATORY:  Clear to auscultation without rales, wheezing or rhonchi  ABDOMEN: Soft, non-tender, non-distended, No pulsatile mass, MUSCULOSKELETAL: No deformity  SKIN: Warm and dry NEUROLOGIC:  Alert and oriented x 3 PSYCHIATRIC:  Normal affect   ASSESSMENT:    1. PVC's (premature ventricular contractions)   2. Essential hypertension   3. 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 Subclavian artery stenosis, right (HCC)   5. Occlusion and stenosis of left carotid artery   6. Nonrheumatic mitral valve regurgitation   7. Chronic systolic CHF (congestive heart failure) (HCC)   8. Obstructive sleep apnea on CPAP   9. Hypothyroidism,  unspecified type   10. Severe obesity (BMI 35.0-39.9) with comorbidity (HCC)   11. S/P mitral valve clip implantation     PLAN:     Frequent PVCs and PACs 1st HB - will get echo and heart monitor, may start non vasodilating BB  CAD s/p CABG - LIMA to LAD, SVG to OM-2, SVG to right PDA, SVG to D-2; prior arrest before lytics in 1994 CAE s/p L CEA R subclavian stenosis - LDL < 45 on atorvastatin 80 mg and zetia 10 mg; I do not feel strongly about fenofibrate; we could come off of this; will DC - continue statin and zetia  HFrecovered EF MR s/p Mitra-Clip HTN with DM - Entresto 24/26  - Echo and AV nodal assessment as above  OSA on CPAP Morbid Obesity  - on ozempic  Hypothyroidism - stable TSH on stable synthroid    AF s/p ablation  - CHA2DS2-VASc=6, HASBLED=2 - continue eliquis       Medication Adjustments/Labs and Tests Ordered: Current medicines are reviewed at length with the patient today.  Concerns regarding medicines are outlined above.  Orders Placed This Encounter  Procedures   LONG TERM MONITOR (3-14 DAYS)   EKG 12-Lead   ECHOCARDIOGRAM COMPLETE   No orders of the defined types were placed in this encounter.   Patient Instructions  Medication Instructions:  Your physician has recommended you make the following change in your medication:   1) STOP fenofibrate (Tricor)  *If you need a refill on your cardiac medications before your next appointment, please call your pharmacy*  Lab Work: None ordered today.  Testing/Procedures: Your physician has requested that you have an echocardiogram. Echocardiography is a painless test that uses sound waves to create images of your heart. It provides your doctor with information about the size and shape of your heart and how well your heart's chambers and valves are working. This procedure takes approximately one hour. There are no restrictions for this procedure. Please do NOT wear cologne, perfume,  aftershave, or lotions (deodorant is allowed). Please arrive 15 minutes prior to your appointment time.  Your physician has requested that you wear a Zio heart monitor for 7 days. Please allow 2 weeks after returning the heart monitor before our office calls you with the results.    Follow-Up: At [facility], you and your health needs are our priority.  As part of our continuing mission to provide you with exceptional heart care, we have created designated Provider Care Teams.  These Care Teams include your primary Cardiologist (physician) and Advanced Practice Providers (APPs -  Physician  Assistants and Nurse Practitioners) who all work together to provide you with the care you need, when you need it.  Your next appointment:   3 month(s) with APP 1 year with Dr. Izora Ribas  The format for your next appointment:   In Person  Provider:   Jari Favre, PA-C, Robin Searing, NP, Eligha Bridegroom, NP, or Tereso Newcomer, PA-C     Then, Raymond Constant, MD will plan to see you again in 1 year(s).{  Other Instructions ZIO XT- Long Term Monitor Instructions     Your physician has requested you wear a ZIO patch monitor for 7 days.  This is a single patch monitor. [employer] supplies one patch monitor per enrollment. Additional  stickers are not available. Please do not apply patch if you will be having a Nuclear Stress Test,  Echocardiogram, Cardiac CT, MRI, or Chest Xray during the period you would be wearing the  monitor. The patch cannot be worn during these tests. You cannot remove and re-apply the  ZIO XT patch monitor.  Your ZIO patch monitor will be mailed 3 day USPS to your address on file. It may take 3-5 days  to receive your monitor after you have been enrolled.  Once you have received your monitor, please review the enclosed instructions. Your monitor  has already been registered assigning a specific monitor serial # to you.     Billing and Patient Assistance Program  Information     We have supplied [employer] with any of your insurance information on file for billing purposes.  [employer] offers a sliding scale Patient Assistance Program for patients that do not have  insurance, or whose insurance does not completely cover the cost of the ZIO monitor.  You must apply for the Patient Assistance Program to qualify for this discounted rate.  To apply, please call [employer] at [phone], select option 4, select option 2, ask to apply for  Patient Assistance Program. Meredeth Ide will ask your household income, and how many people  are in your household. They will quote your out-of-pocket cost based on that information.  [employer] will also be able to set up a 51-month, interest-free payment plan if needed.     Applying the monitor     Shave hair from upper left chest.  Hold abrader disc by orange tab. Rub abrader in 40 strokes over the upper left chest as  indicated in your monitor instructions.  Clean area with 4 enclosed alcohol pads. Let dry.  Apply patch as indicated in monitor instructions. Patch will be placed under collarbone on left  side of chest with arrow pointing upward.  Rub patch adhesive wings for 2 minutes. Remove white label marked "1". Remove the white  label marked "2". Rub patch adhesive wings for 2 additional minutes.  While looking in a mirror, press and release button in center of patch. A small green light will  flash 3-4 times. This will be your only indicator that the monitor has been turned on.  Do not shower for the first 24 hours. You may shower after the first 24 hours.  Press the button if you feel a symptom. You will hear a small click. Record Date, Time and  Symptom in the Patient Logbook.  When you are ready to remove the patch, follow instructions on the last 2 pages of Patient  Logbook. Stick patch monitor onto the last page of Patient Logbook.  Place Patient Logbook in the blue and white box. Use locking tab on box  and tape box  closed  securely. The blue and white box has prepaid postage on it. Please place it in the mailbox as  soon as possible. Your physician should have your test results approximately 7 days after the  monitor has been mailed back to [facility].  Call [facility] Customer Care at [phone] if you have questions regarding  your ZIO XT patch monitor. Call them immediately if you see an orange light blinking on your  monitor.  If your monitor falls off in less than 4 days, contact our Monitor department at [phone].  If your monitor becomes loose or falls off after 4 days call [employer] at [phone] for  suggestions on securing your monitor.     Signed, Raymond Constant, MD  05/31/2022 11:37 AM    [facility]

## 2022-06-14 ENCOUNTER — Telehealth: Payer: Self-pay | Admitting: Internal Medicine

## 2022-06-14 MED ORDER — METOPROLOL TARTRATE 25 MG PO TABS: ORAL_TABLET | ORAL | 3 refills | Status: DC

## 2022-06-14 NOTE — Telephone Encounter (Signed)
Pt calling about monitor results. He states he reviewed through mychart and wants to discuss adding the medication mentioned.

## 2022-06-14 NOTE — Telephone Encounter (Signed)
-----   Message from Christell Constant, MD sent at 06/13/2022  2:06 PM EDT ----- Results: Frequent PVCs Plan: Continue with plan to get echocardiogram Low dose metoprolol (12.5 mg PO BID)  Christell Constant, MD

## 2022-06-14 NOTE — Telephone Encounter (Signed)
The patient has been notified of the result and verbalized understanding.  All questions (if any) were answered. Macie Burows, RN 06/14/2022 11:20 AM

## 2022-06-14 NOTE — Telephone Encounter (Signed)
Patient is following up. He would like to know if he can go into the office to discuss. Please advise.

## 2022-06-16 ENCOUNTER — Encounter: Payer: Self-pay | Admitting: Internal Medicine

## 2022-06-17 ENCOUNTER — Telehealth: Payer: Self-pay | Admitting: Internal Medicine

## 2022-06-17 NOTE — Telephone Encounter (Signed)
Concerned addressed through telephone encounter.  Please see encounter for more details.

## 2022-06-17 NOTE — Telephone Encounter (Signed)
Pt c/o medication issue:  1. Name of Medication:  metoprolol tartrate (LOPRESSOR) 25 MG tablet  2. How are you currently taking this medication (dosage and times per day)?  1/2 tablet twice daily  3. Are you having a reaction (difficulty breathing--STAT)?   4. What is your medication issue?   Patient states this medication is causing his BP to drop (101/54, 101/63). He states he has also been tired.

## 2022-06-17 NOTE — Telephone Encounter (Signed)
Called pt in regards to metoprolol.   Reports started medication June 1 and experienced tiredness.  Previously took metoprolol succinate that was stopped d/t fatigue.   Pt also previously took carvedilol 6.25 mg that was discontinued.   Denies lightheadedness and dizziness.  Reports could do better with PO fluid intake.  Estimates may drink 3-4 glasses per day.  Does not feel funny heart beats. Last took metoprolol tartrate this morning. Advised pt med was started d/t frequent PVC's.  Which could impact EF.  Pt is scheduled for an Echo on 6/19.  Advised MD is not in the office but I will send message to be addressed.

## 2022-06-17 NOTE — Telephone Encounter (Signed)
Called pt advised of MD response to concern: We can hold until after the echo; he he has LV dysfunction we will either start AAD therapy or have him see EP    Pt will call back if has further needs.

## 2022-06-17 NOTE — Telephone Encounter (Signed)
Patient called again to follow-up on next steps with this medication.

## 2022-06-21 ENCOUNTER — Telehealth: Payer: Self-pay | Admitting: Family Medicine

## 2022-06-21 MED ORDER — NITROGLYCERIN 0.4 MG SL SUBL: 0.4000 mg | SUBLINGUAL_TABLET | SUBLINGUAL | 12 refills | Status: AC | PRN

## 2022-06-21 NOTE — Telephone Encounter (Signed)
BP running around 83/52, checked it 30 mins. ago and yesterday got the same readings, Patient is feeling ok, no light headedness. BP just low and wanted PCP to know. Patient verbalized if have any concerning symptoms to go to the ER.

## 2022-06-21 NOTE — Telephone Encounter (Signed)
Called the patient informed of PCP instructions. Last reading was 113/60. But will follow PCP instructions. Refilled his nitro scripts because he left his bottle in his pocket and was put in the washing machine.

## 2022-06-21 NOTE — Telephone Encounter (Signed)
If SBP <90, should hold his metoprolol.

## 2022-06-21 NOTE — Telephone Encounter (Signed)
Patient called stating his blood pressure has been low. Tried to transfer to triage bu triage kept him on hold for 10+ minutes and pt did not want to wait. There are no available appointments with PCP today. Please advise.

## 2022-06-24 ENCOUNTER — Encounter: Payer: Self-pay | Admitting: Family Medicine

## 2022-06-25 ENCOUNTER — Emergency Department (HOSPITAL_COMMUNITY): Payer: Medicare (Managed Care)

## 2022-06-25 ENCOUNTER — Inpatient Hospital Stay (HOSPITAL_COMMUNITY)
Admission: EM | Admit: 2022-06-25 | Discharge: 2022-06-27 | DRG: 281 | Disposition: A | Discharge status: Home Health | Payer: Medicare (Managed Care) | Attending: Internal Medicine | Admitting: Internal Medicine

## 2022-06-25 ENCOUNTER — Other Ambulatory Visit: Payer: Self-pay

## 2022-06-25 DIAGNOSIS — I214 Non-ST elevation (NSTEMI) myocardial infarction: Principal | ICD-10-CM | POA: Diagnosis present

## 2022-06-25 DIAGNOSIS — T82867A Thrombosis of cardiac prosthetic devices, implants and grafts, initial encounter: Secondary | ICD-10-CM | POA: Diagnosis present

## 2022-06-25 DIAGNOSIS — F32A Depression, unspecified: Secondary | ICD-10-CM | POA: Diagnosis present

## 2022-06-25 DIAGNOSIS — Z79890 Hormone replacement therapy (postmenopausal): Secondary | ICD-10-CM

## 2022-06-25 DIAGNOSIS — I5022 Chronic systolic (congestive) heart failure: Secondary | ICD-10-CM | POA: Diagnosis present

## 2022-06-25 DIAGNOSIS — I10 Essential (primary) hypertension: Secondary | ICD-10-CM | POA: Diagnosis present

## 2022-06-25 DIAGNOSIS — Z951 Presence of aortocoronary bypass graft: Secondary | ICD-10-CM

## 2022-06-25 DIAGNOSIS — Z9889 Other specified postprocedural states: Secondary | ICD-10-CM

## 2022-06-25 DIAGNOSIS — E876 Hypokalemia: Secondary | ICD-10-CM | POA: Insufficient documentation

## 2022-06-25 DIAGNOSIS — Z6841 Body Mass Index (BMI) 40.0 and over, adult: Secondary | ICD-10-CM

## 2022-06-25 DIAGNOSIS — T82855A Stenosis of coronary artery stent, initial encounter: Secondary | ICD-10-CM | POA: Diagnosis present

## 2022-06-25 DIAGNOSIS — I252 Old myocardial infarction: Secondary | ICD-10-CM

## 2022-06-25 DIAGNOSIS — E669 Obesity, unspecified: Secondary | ICD-10-CM | POA: Diagnosis present

## 2022-06-25 DIAGNOSIS — K219 Gastro-esophageal reflux disease without esophagitis: Secondary | ICD-10-CM | POA: Diagnosis present

## 2022-06-25 DIAGNOSIS — Z96649 Presence of unspecified artificial hip joint: Secondary | ICD-10-CM | POA: Diagnosis present

## 2022-06-25 DIAGNOSIS — I708 Atherosclerosis of other arteries: Secondary | ICD-10-CM | POA: Diagnosis present

## 2022-06-25 DIAGNOSIS — Z87891 Personal history of nicotine dependence: Secondary | ICD-10-CM

## 2022-06-25 DIAGNOSIS — I25810 Atherosclerosis of coronary artery bypass graft(s) without angina pectoris: Secondary | ICD-10-CM | POA: Diagnosis present

## 2022-06-25 DIAGNOSIS — Z79899 Other long term (current) drug therapy: Secondary | ICD-10-CM

## 2022-06-25 DIAGNOSIS — I059 Rheumatic mitral valve disease, unspecified: Secondary | ICD-10-CM | POA: Diagnosis present

## 2022-06-25 DIAGNOSIS — I2582 Chronic total occlusion of coronary artery: Secondary | ICD-10-CM | POA: Diagnosis present

## 2022-06-25 DIAGNOSIS — I340 Nonrheumatic mitral (valve) insufficiency: Secondary | ICD-10-CM | POA: Diagnosis present

## 2022-06-25 DIAGNOSIS — E039 Hypothyroidism, unspecified: Secondary | ICD-10-CM | POA: Diagnosis present

## 2022-06-25 DIAGNOSIS — I110 Hypertensive heart disease with heart failure: Secondary | ICD-10-CM | POA: Diagnosis present

## 2022-06-25 DIAGNOSIS — Z818 Family history of other mental and behavioral disorders: Secondary | ICD-10-CM

## 2022-06-25 DIAGNOSIS — I4819 Other persistent atrial fibrillation: Secondary | ICD-10-CM | POA: Diagnosis present

## 2022-06-25 DIAGNOSIS — R079 Chest pain, unspecified: Secondary | ICD-10-CM | POA: Diagnosis not present

## 2022-06-25 DIAGNOSIS — I2510 Atherosclerotic heart disease of native coronary artery without angina pectoris: Secondary | ICD-10-CM | POA: Diagnosis present

## 2022-06-25 DIAGNOSIS — Z882 Allergy status to sulfonamides status: Secondary | ICD-10-CM

## 2022-06-25 DIAGNOSIS — E119 Type 2 diabetes mellitus without complications: Secondary | ICD-10-CM | POA: Diagnosis present

## 2022-06-25 DIAGNOSIS — I493 Ventricular premature depolarization: Secondary | ICD-10-CM | POA: Diagnosis present

## 2022-06-25 DIAGNOSIS — I259 Chronic ischemic heart disease, unspecified: Principal | ICD-10-CM

## 2022-06-25 DIAGNOSIS — I5042 Chronic combined systolic (congestive) and diastolic (congestive) heart failure: Secondary | ICD-10-CM | POA: Diagnosis present

## 2022-06-25 DIAGNOSIS — G4733 Obstructive sleep apnea (adult) (pediatric): Secondary | ICD-10-CM

## 2022-06-25 DIAGNOSIS — E785 Hyperlipidemia, unspecified: Secondary | ICD-10-CM | POA: Diagnosis present

## 2022-06-25 DIAGNOSIS — Y712 Prosthetic and other implants, materials and accessory cardiovascular devices associated with adverse incidents: Secondary | ICD-10-CM | POA: Diagnosis present

## 2022-06-25 DIAGNOSIS — E1169 Type 2 diabetes mellitus with other specified complication: Secondary | ICD-10-CM | POA: Diagnosis present

## 2022-06-25 DIAGNOSIS — Z7901 Long term (current) use of anticoagulants: Secondary | ICD-10-CM

## 2022-06-25 LAB — BASIC METABOLIC PANEL
Anion gap: 8 (ref 5–15)
BUN: 13 mg/dL (ref 8–23)
CO2: 23 mmol/L (ref 22–32)
Calcium: 8.9 mg/dL (ref 8.9–10.3)
Chloride: 104 mmol/L (ref 98–111)
Creatinine, Ser: 1.09 mg/dL (ref 0.61–1.24)
GFR, Estimated: >60 mL/min (ref >60)
Glucose, Bld: 86 mg/dL (ref 70–99)
Potassium: 3.7 mmol/L (ref 3.5–5.1)
Sodium: 135 mmol/L (ref 135–145)

## 2022-06-25 LAB — PROTIME-INR
INR: 1.1 (ref 0.8–1.2)
Prothrombin Time: 14.7 seconds (ref 11.4–15.2)

## 2022-06-25 LAB — CBC
HCT: 40.4 % (ref 39.0–52.0)
Hemoglobin: 12.9 g/dL — ABNORMAL LOW (ref 13.0–17.0)
MCH: 27.5 pg (ref 26.0–34.0)
MCHC: 31.9 g/dL (ref 30.0–36.0)
MCV: 86.1 fL (ref 80.0–100.0)
Platelets: 157 10*3/uL (ref 150–400)
RBC: 4.69 MIL/uL (ref 4.22–5.81)
RDW: 14.9 % (ref 11.5–15.5)
WBC: 11.5 10*3/uL — ABNORMAL HIGH (ref 4.0–10.5)
nRBC: 0 % (ref 0.0–0.2)

## 2022-06-25 LAB — TROPONIN I (HIGH SENSITIVITY): Troponin I (High Sensitivity): 250 ng/L (ref <18)

## 2022-06-25 NOTE — ED Triage Notes (Signed)
Patient reports pain across his chest radiating to right arm and shoulder onset yesterday with mild SOB , denies emesis or diaphoresis , history of CAD/CABG/CHF . He took 1 NTG sl prior to arrival with mild relief .

## 2022-06-26 ENCOUNTER — Encounter (HOSPITAL_COMMUNITY): Payer: Self-pay | Admitting: Internal Medicine

## 2022-06-26 ENCOUNTER — Inpatient Hospital Stay (HOSPITAL_COMMUNITY)
Admission: EM | Disposition: A | Discharge status: Home Health | Payer: Self-pay | Source: Home / Self Care | Attending: Internal Medicine

## 2022-06-26 ENCOUNTER — Other Ambulatory Visit (HOSPITAL_COMMUNITY): Payer: Medicare (Managed Care)

## 2022-06-26 ENCOUNTER — Inpatient Hospital Stay (HOSPITAL_COMMUNITY): Payer: Medicare (Managed Care)

## 2022-06-26 DIAGNOSIS — I214 Non-ST elevation (NSTEMI) myocardial infarction: Principal | ICD-10-CM

## 2022-06-26 DIAGNOSIS — I5022 Chronic systolic (congestive) heart failure: Secondary | ICD-10-CM

## 2022-06-26 DIAGNOSIS — E039 Hypothyroidism, unspecified: Secondary | ICD-10-CM | POA: Diagnosis present

## 2022-06-26 DIAGNOSIS — I4819 Other persistent atrial fibrillation: Secondary | ICD-10-CM | POA: Diagnosis present

## 2022-06-26 DIAGNOSIS — I480 Paroxysmal atrial fibrillation: Secondary | ICD-10-CM

## 2022-06-26 DIAGNOSIS — Z96649 Presence of unspecified artificial hip joint: Secondary | ICD-10-CM | POA: Diagnosis present

## 2022-06-26 DIAGNOSIS — Z95818 Presence of other cardiac implants and grafts: Secondary | ICD-10-CM

## 2022-06-26 DIAGNOSIS — I25810 Atherosclerosis of coronary artery bypass graft(s) without angina pectoris: Secondary | ICD-10-CM | POA: Diagnosis present

## 2022-06-26 DIAGNOSIS — T82855A Stenosis of coronary artery stent, initial encounter: Secondary | ICD-10-CM | POA: Diagnosis present

## 2022-06-26 DIAGNOSIS — I25110 Atherosclerotic heart disease of native coronary artery with unstable angina pectoris: Secondary | ICD-10-CM

## 2022-06-26 DIAGNOSIS — F32A Depression, unspecified: Secondary | ICD-10-CM | POA: Diagnosis present

## 2022-06-26 DIAGNOSIS — E876 Hypokalemia: Secondary | ICD-10-CM | POA: Diagnosis not present

## 2022-06-26 DIAGNOSIS — K219 Gastro-esophageal reflux disease without esophagitis: Secondary | ICD-10-CM | POA: Diagnosis present

## 2022-06-26 DIAGNOSIS — R079 Chest pain, unspecified: Secondary | ICD-10-CM | POA: Diagnosis present

## 2022-06-26 DIAGNOSIS — I2582 Chronic total occlusion of coronary artery: Secondary | ICD-10-CM | POA: Diagnosis present

## 2022-06-26 DIAGNOSIS — I2510 Atherosclerotic heart disease of native coronary artery without angina pectoris: Secondary | ICD-10-CM

## 2022-06-26 DIAGNOSIS — E785 Hyperlipidemia, unspecified: Secondary | ICD-10-CM | POA: Diagnosis present

## 2022-06-26 DIAGNOSIS — I708 Atherosclerosis of other arteries: Secondary | ICD-10-CM | POA: Diagnosis present

## 2022-06-26 DIAGNOSIS — E669 Obesity, unspecified: Secondary | ICD-10-CM | POA: Diagnosis present

## 2022-06-26 DIAGNOSIS — Z6841 Body Mass Index (BMI) 40.0 and over, adult: Secondary | ICD-10-CM | POA: Diagnosis not present

## 2022-06-26 DIAGNOSIS — Z951 Presence of aortocoronary bypass graft: Secondary | ICD-10-CM

## 2022-06-26 DIAGNOSIS — I110 Hypertensive heart disease with heart failure: Secondary | ICD-10-CM | POA: Diagnosis present

## 2022-06-26 DIAGNOSIS — Y712 Prosthetic and other implants, materials and accessory cardiovascular devices associated with adverse incidents: Secondary | ICD-10-CM | POA: Diagnosis present

## 2022-06-26 DIAGNOSIS — I340 Nonrheumatic mitral (valve) insufficiency: Secondary | ICD-10-CM | POA: Diagnosis present

## 2022-06-26 DIAGNOSIS — Z9889 Other specified postprocedural states: Secondary | ICD-10-CM

## 2022-06-26 DIAGNOSIS — T82867A Thrombosis of cardiac prosthetic devices, implants and grafts, initial encounter: Secondary | ICD-10-CM | POA: Diagnosis present

## 2022-06-26 DIAGNOSIS — I493 Ventricular premature depolarization: Secondary | ICD-10-CM | POA: Diagnosis present

## 2022-06-26 DIAGNOSIS — Z79899 Other long term (current) drug therapy: Secondary | ICD-10-CM | POA: Diagnosis not present

## 2022-06-26 DIAGNOSIS — E119 Type 2 diabetes mellitus without complications: Secondary | ICD-10-CM | POA: Diagnosis present

## 2022-06-26 DIAGNOSIS — Z79890 Hormone replacement therapy (postmenopausal): Secondary | ICD-10-CM | POA: Diagnosis not present

## 2022-06-26 DIAGNOSIS — I5042 Chronic combined systolic (congestive) and diastolic (congestive) heart failure: Secondary | ICD-10-CM | POA: Diagnosis present

## 2022-06-26 DIAGNOSIS — G4733 Obstructive sleep apnea (adult) (pediatric): Secondary | ICD-10-CM | POA: Diagnosis present

## 2022-06-26 HISTORY — PX: LEFT HEART CATH AND CORS/GRAFTS ANGIOGRAPHY: CATH118250

## 2022-06-26 LAB — BASIC METABOLIC PANEL
Anion gap: 10 (ref 5–15)
BUN: 16 mg/dL (ref 8–23)
CO2: 21 mmol/L — ABNORMAL LOW (ref 22–32)
Calcium: 8.6 mg/dL — ABNORMAL LOW (ref 8.9–10.3)
Chloride: 103 mmol/L (ref 98–111)
Creatinine, Ser: 0.85 mg/dL (ref 0.61–1.24)
GFR, Estimated: >60 mL/min (ref >60)
Glucose, Bld: 91 mg/dL (ref 70–99)
Potassium: 3.7 mmol/L (ref 3.5–5.1)
Sodium: 134 mmol/L — ABNORMAL LOW (ref 135–145)

## 2022-06-26 LAB — CBC
HCT: 39.9 % (ref 39.0–52.0)
Hemoglobin: 12.3 g/dL — ABNORMAL LOW (ref 13.0–17.0)
MCH: 27.3 pg (ref 26.0–34.0)
MCHC: 30.8 g/dL (ref 30.0–36.0)
MCV: 88.5 fL (ref 80.0–100.0)
Platelets: 120 10*3/uL — ABNORMAL LOW (ref 150–400)
RBC: 4.51 MIL/uL (ref 4.22–5.81)
RDW: 14.7 % (ref 11.5–15.5)
WBC: 9.4 10*3/uL (ref 4.0–10.5)
nRBC: 0 % (ref 0.0–0.2)

## 2022-06-26 LAB — ECHOCARDIOGRAM COMPLETE
Area-P 1/2: 3.62 cm2
Calc EF: 49.6 %
Height: 67 in
MV M vel: 4.81 m/s
MV Peak grad: 92.5 mmHg
Radius: 0.6 cm
S' Lateral: 4 cm
Single Plane A2C EF: 54.1 %
Single Plane A4C EF: 48.6 %
Weight: 3312.01 oz

## 2022-06-26 LAB — LIPID PANEL
Cholesterol: 85 mg/dL (ref 0–200)
HDL: 40 mg/dL — ABNORMAL LOW (ref >40)
LDL Cholesterol: 34 mg/dL (ref 0–99)
Total CHOL/HDL Ratio: 2.1 RATIO
Triglycerides: 53 mg/dL (ref <150)
VLDL: 11 mg/dL (ref 0–40)

## 2022-06-26 LAB — GLUCOSE, CAPILLARY
Glucose-Capillary: 94 mg/dL (ref 70–99)
Glucose-Capillary: 93 mg/dL (ref 70–99)
Glucose-Capillary: 94 mg/dL (ref 70–99)
Glucose-Capillary: 132 mg/dL — ABNORMAL HIGH (ref 70–99)

## 2022-06-26 LAB — TROPONIN I (HIGH SENSITIVITY)
Troponin I (High Sensitivity): 710 ng/L (ref <18)
Troponin I (High Sensitivity): 1478 ng/L (ref <18)

## 2022-06-26 LAB — LIPOPROTEIN A (LPA): Lipoprotein (a): <8.4 nmol/L (ref <75.0)

## 2022-06-26 LAB — TSH: TSH: 2.028 u[IU]/mL (ref 0.350–4.500)

## 2022-06-26 LAB — PROTIME-INR
INR: 1.2 (ref 0.8–1.2)
Prothrombin Time: 15.1 seconds (ref 11.4–15.2)

## 2022-06-26 LAB — HEMOGLOBIN A1C
Hgb A1c MFr Bld: 5.4 % (ref 4.8–5.6)
Mean Plasma Glucose: 108.28 mg/dL

## 2022-06-26 LAB — POCT ACTIVATED CLOTTING TIME: Activated Clotting Time: 158 seconds

## 2022-06-26 LAB — BRAIN NATRIURETIC PEPTIDE: B Natriuretic Peptide: 88.4 pg/mL (ref 0.0–100.0)

## 2022-06-26 LAB — T4, FREE: Free T4: 0.76 ng/dL (ref 0.61–1.12)

## 2022-06-26 SURGERY — LEFT HEART CATH AND CORS/GRAFTS ANGIOGRAPHY
Anesthesia: LOCAL

## 2022-06-26 MED ORDER — FENTANYL CITRATE PF 50 MCG/ML IJ SOSY
50.0000 ug | PREFILLED_SYRINGE | INTRAMUSCULAR | Status: DC | PRN
  Administered 2022-06-26: 50 ug via INTRAVENOUS
  Filled 2022-06-26: qty 1

## 2022-06-26 MED ORDER — SODIUM CHLORIDE 0.9 % IV SOLN: INTRAVENOUS | Status: AC

## 2022-06-26 MED ORDER — IOHEXOL 350 MG/ML SOLN
INTRAVENOUS | Status: DC | PRN
  Administered 2022-06-26: 90 mL

## 2022-06-26 MED ORDER — ONDANSETRON HCL 4 MG/2ML IJ SOLN: 4.0000 mg | Freq: Four times a day (QID) | INTRAMUSCULAR | Status: DC | PRN

## 2022-06-26 MED ORDER — ATORVASTATIN CALCIUM 80 MG PO TABS
80.0000 mg | ORAL_TABLET | Freq: Every day | ORAL | Status: DC
  Administered 2022-06-27: 80 mg via ORAL
  Filled 2022-06-26: qty 1

## 2022-06-26 MED ORDER — ACETAMINOPHEN 325 MG PO TABS: 650.0000 mg | ORAL_TABLET | ORAL | Status: DC | PRN

## 2022-06-26 MED ORDER — LABETALOL HCL 5 MG/ML IV SOLN: 10.0000 mg | INTRAVENOUS | Status: AC | PRN

## 2022-06-26 MED ORDER — FENTANYL CITRATE (PF) 100 MCG/2ML IJ SOLN
INTRAMUSCULAR | Status: DC | PRN
  Administered 2022-06-26: 25 ug via INTRAVENOUS

## 2022-06-26 MED ORDER — ASPIRIN 81 MG PO TBEC
81.0000 mg | DELAYED_RELEASE_TABLET | Freq: Every day | ORAL | Status: DC
  Administered 2022-06-27: 81 mg via ORAL
  Filled 2022-06-26 (×2): qty 1

## 2022-06-26 MED ORDER — HEPARIN (PORCINE) IN NACL 1000-0.9 UT/500ML-% IV SOLN
INTRAVENOUS | Status: DC | PRN
  Administered 2022-06-26 (×2): 500 mL

## 2022-06-26 MED ORDER — ASPIRIN 81 MG PO CHEW: 81.0000 mg | CHEWABLE_TABLET | Freq: Every day | ORAL | Status: DC

## 2022-06-26 MED ORDER — LEVOTHYROXINE SODIUM 75 MCG PO TABS
75.0000 ug | ORAL_TABLET | Freq: Every day | ORAL | Status: DC
  Administered 2022-06-26: 75 ug via ORAL
  Filled 2022-06-26 (×2): qty 1

## 2022-06-26 MED ORDER — NITROGLYCERIN IN D5W 200-5 MCG/ML-% IV SOLN
0.0000 ug/min | INTRAVENOUS | Status: DC
  Administered 2022-06-26: 5 ug/min via INTRAVENOUS
  Filled 2022-06-26: qty 250

## 2022-06-26 MED ORDER — MIDAZOLAM HCL 2 MG/2ML IJ SOLN
INTRAMUSCULAR | Status: AC
  Filled 2022-06-26: qty 2

## 2022-06-26 MED ORDER — SODIUM CHLORIDE 0.9 % IV SOLN: 250.0000 mL | INTRAVENOUS | Status: DC | PRN

## 2022-06-26 MED ORDER — SACUBITRIL-VALSARTAN 24-26 MG PO TABS
1.0000 | ORAL_TABLET | Freq: Two times a day (BID) | ORAL | Status: DC
  Administered 2022-06-26 – 2022-06-27 (×3): 1 via ORAL
  Filled 2022-06-26 (×3): qty 1

## 2022-06-26 MED ORDER — MIDAZOLAM HCL 2 MG/2ML IJ SOLN
INTRAMUSCULAR | Status: DC | PRN
  Administered 2022-06-26 (×2): 1 mg via INTRAVENOUS

## 2022-06-26 MED ORDER — SODIUM CHLORIDE 0.9% FLUSH: 3.0000 mL | INTRAVENOUS | Status: DC | PRN

## 2022-06-26 MED ORDER — SODIUM CHLORIDE 0.9% FLUSH
3.0000 mL | Freq: Two times a day (BID) | INTRAVENOUS | Status: DC
  Administered 2022-06-26: 3 mL via INTRAVENOUS

## 2022-06-26 MED ORDER — HEPARIN (PORCINE) 25000 UT/250ML-% IV SOLN
1200.0000 [IU]/h | INTRAVENOUS | Status: DC
  Administered 2022-06-26: 1200 [IU]/h via INTRAVENOUS
  Filled 2022-06-26: qty 250

## 2022-06-26 MED ORDER — FENTANYL CITRATE (PF) 100 MCG/2ML IJ SOLN
INTRAMUSCULAR | Status: AC
  Filled 2022-06-26: qty 2

## 2022-06-26 MED ORDER — SERTRALINE HCL 100 MG PO TABS
100.0000 mg | ORAL_TABLET | Freq: Every day | ORAL | Status: DC
  Administered 2022-06-27: 100 mg via ORAL
  Filled 2022-06-26: qty 1

## 2022-06-26 MED ORDER — LIDOCAINE HCL (PF) 1 % IJ SOLN
INTRAMUSCULAR | Status: DC | PRN
  Administered 2022-06-26: 15 mL

## 2022-06-26 MED ORDER — NITROGLYCERIN 2 % TD OINT
1.0000 [in_us] | TOPICAL_OINTMENT | Freq: Once | TRANSDERMAL | Status: AC
  Administered 2022-06-26: 1 [in_us] via TOPICAL
  Filled 2022-06-26: qty 1

## 2022-06-26 MED ORDER — NITROGLYCERIN 0.4 MG SL SUBL: 0.4000 mg | SUBLINGUAL_TABLET | SUBLINGUAL | Status: DC | PRN

## 2022-06-26 MED ORDER — VITAMIN D 25 MCG (1000 UNIT) PO TABS
10000.0000 [IU] | ORAL_TABLET | Freq: Every day | ORAL | Status: DC
  Administered 2022-06-27: 10000 [IU] via ORAL
  Filled 2022-06-26: qty 10

## 2022-06-26 MED ORDER — SODIUM CHLORIDE 0.9 % WEIGHT BASED INFUSION
3.0000 mL/kg/h | INTRAVENOUS | Status: DC
  Administered 2022-06-26: 3 mL/kg/h via INTRAVENOUS

## 2022-06-26 MED ORDER — HEPARIN SODIUM (PORCINE) 1000 UNIT/ML IJ SOLN
INTRAMUSCULAR | Status: AC
  Filled 2022-06-26: qty 10

## 2022-06-26 MED ORDER — FAMOTIDINE 20 MG PO TABS
20.0000 mg | ORAL_TABLET | Freq: Every day | ORAL | Status: DC
  Administered 2022-06-27: 20 mg via ORAL
  Filled 2022-06-26: qty 1

## 2022-06-26 MED ORDER — LIDOCAINE HCL (PF) 1 % IJ SOLN
INTRAMUSCULAR | Status: AC
  Filled 2022-06-26: qty 30

## 2022-06-26 MED ORDER — LIOTHYRONINE SODIUM 5 MCG PO TABS
10.0000 ug | ORAL_TABLET | Freq: Every day | ORAL | Status: DC
  Administered 2022-06-27: 10 ug via ORAL
  Filled 2022-06-26: qty 2

## 2022-06-26 MED ORDER — DIAZEPAM 5 MG PO TABS: 5.0000 mg | ORAL_TABLET | Freq: Four times a day (QID) | ORAL | Status: DC | PRN

## 2022-06-26 MED ORDER — SODIUM CHLORIDE 0.9 % WEIGHT BASED INFUSION: 1.0000 mL/kg/h | INTRAVENOUS | Status: DC

## 2022-06-26 MED ORDER — EZETIMIBE 10 MG PO TABS
10.0000 mg | ORAL_TABLET | Freq: Every day | ORAL | Status: DC
  Administered 2022-06-27: 10 mg via ORAL
  Filled 2022-06-26: qty 1

## 2022-06-26 MED ORDER — ASPIRIN 81 MG PO CHEW
324.0000 mg | CHEWABLE_TABLET | Freq: Once | ORAL | Status: AC
  Administered 2022-06-26: 324 mg via ORAL
  Filled 2022-06-26: qty 4

## 2022-06-26 MED ORDER — NITROGLYCERIN 1 MG/10 ML FOR IR/CATH LAB
INTRA_ARTERIAL | Status: AC
  Filled 2022-06-26: qty 10

## 2022-06-26 MED ORDER — HYDRALAZINE HCL 20 MG/ML IJ SOLN: 10.0000 mg | INTRAMUSCULAR | Status: AC | PRN

## 2022-06-26 SURGICAL SUPPLY — 13 items
CATH INFINITI 5 FR IM (CATHETERS) IMPLANT
CATH INFINITI 5 FR RCB (CATHETERS) IMPLANT
CATH INFINITI 5FR MULTPACK ANG (CATHETERS) IMPLANT
KIT HEART LEFT (KITS) ×1 IMPLANT
KIT MICROPUNCTURE NIT STIFF (SHEATH) IMPLANT
MAT PREVALON FULL STRYKER (MISCELLANEOUS) IMPLANT
PACK CARDIAC CATHETERIZATION (CUSTOM PROCEDURE TRAY) ×1 IMPLANT
SHEATH PINNACLE 5F 10CM (SHEATH) IMPLANT
SHEATH PROBE COVER 6X72 (BAG) IMPLANT
TRANSDUCER W/STOPCOCK (MISCELLANEOUS) ×1 IMPLANT
TUBING CIL FLEX 10 FLL-RA (TUBING) ×1 IMPLANT
WIRE EMERALD 3MM-J .035X150CM (WIRE) IMPLANT
WIRE EMERALD 3MM-J .035X260CM (WIRE) IMPLANT

## 2022-06-26 NOTE — ED Notes (Signed)
1 inch Nitro patch removed by this nurse from pt

## 2022-06-26 NOTE — ED Provider Notes (Signed)
[place] EMERGENCY DEPARTMENT AT [facility] Provider Note   CSN: [id number] Arrival date & time: 06/25/22  2233     History  Chief Complaint  Patient presents with   Chest Pain    Hx. CAD/CABG/CHF    Raymond Christensen is a 82 y.o. male.   Chest Pain   82 year old male presents emergency department with complaints of lower chest pain described as tightness/pressure.  Patient states that symptoms have been present since Monday initially intermittent onset before they became more constant.  Currently complaining of chest pain.  Reports some radiation to his right arm.  Denies any fever, shortness of breath, cough, congestion, abdominal pain, nausea, vomiting, diaphoresis.  States that he is on Eliquis for history of atrial fibrillation.  Past medical history significant for persistent atrial fibrillation on Eliquis, CAD, CHF, diabetes mellitus type 2, hypertension, hypothyroidism, mitral valve regurgitation, major depressive disorder, OSA, MI (1996, 2021) with stent placement, CABG  Home Medications Prior to Admission medications   Medication Sig Start Date End Date Taking? Authorizing Provider  acetaminophen (TYLENOL) 500 MG tablet Take 1,000 mg by mouth every 6 (six) hours as needed for moderate pain or headache.   Yes [provider]  atorvastatin (LIPITOR) 80 MG tablet TAKE 1 TABLET DAILY Patient taking differently: Take 80 mg by mouth daily. 11/15/21  Yes Lyn Records, MD  ELIQUIS 5 MG TABS tablet TAKE 1 TABLET TWICE A DAY 09/14/21  Yes Lyn Records, MD  ENTRESTO 24-26 MG TAKE 1 TABLET TWICE A DAY Patient taking differently: Take 1 tablet by mouth 2 (two) times daily. 09/14/21  Yes Lyn Records, MD  ezetimibe (ZETIA) 10 MG tablet Take 1 tablet (10 mg total) by mouth daily. 05/20/22  Yes Chandrasekhar, Mahesh A, MD  famotidine (PEPCID) 20 MG tablet Take 1 tablet (20 mg total) by mouth daily. 09/07/19  Yes Saguier, Ramon Dredge, PA-C  levothyroxine (SYNTHROID) 75 MCG  tablet Take 1 tablet (75 mcg total) by mouth daily. 12/27/21  Yes Wendling, Jilda Roche, DO  liothyronine (CYTOMEL) 5 MCG tablet TAKE 2 TABLETS DAILY Patient taking differently: Take 10 mcg by mouth daily. 10/15/21  Yes Wendling, Jilda Roche, DO  Multiple Vitamins-Minerals (MULTIVITAMIN ADULT EXTRA C PO) Take 1 tablet by mouth daily.    Yes [provider]  nitroGLYCERIN (NITROSTAT) 0.4 MG SL tablet Place 1 tablet (0.4 mg total) under the tongue every 5 (five) minutes as needed for chest pain. 06/21/22  Yes Sharlene Dory, DO  Semaglutide, 2 MG/DOSE, 8 MG/3ML SOPN Inject 2 mg as directed once a week. Patient taking differently: Inject 2 mg as directed once a week. Sunday 05/03/22  Yes Sharlene Dory, DO  sertraline (ZOLOFT) 100 MG tablet Take 0.5 tablets (50 mg total) by mouth daily. Patient taking differently: Take 100 mg by mouth daily. 05/29/22  Yes Sharlene Dory, DO  Cholecalciferol (VITAMIN D3) 250 MCG (10000 UT) capsule Take 10,000 Units by mouth daily.     [provider]  metoprolol tartrate (LOPRESSOR) 25 MG tablet 1/2 pill twice daily Patient not taking: Reported on 06/26/2022 06/14/22   Christell Constant, MD      Allergies    Sulfa antibiotics    Review of Systems   Review of Systems  Cardiovascular:  Positive for chest pain.    Physical Exam Updated Vital Signs BP (!) 148/64   Pulse 75   Temp 98.1 F (36.7 C) (Oral)   Resp 16   Wt 93.9 kg  SpO2 99%   BMI 40.43 kg/m  Physical Exam  ED Results / Procedures / Treatments   Labs (all labs ordered are listed, but only abnormal results are displayed) Labs Reviewed  CBC - Abnormal; Notable for the following components:      Result Value   WBC 11.5 (*)    Hemoglobin 12.9 (*)    All other components within normal limits  TROPONIN I (HIGH SENSITIVITY) - Abnormal; Notable for the following components:   Troponin I (High Sensitivity) 250 (*)    All other components within  normal limits  TROPONIN I (HIGH SENSITIVITY) - Abnormal; Notable for the following components:   Troponin I (High Sensitivity) 710 (*)    All other components within normal limits  BASIC METABOLIC PANEL  PROTIME-INR  BRAIN NATRIURETIC PEPTIDE  HEPARIN LEVEL (UNFRACTIONATED)  APTT  CBC  LIPOPROTEIN A (LPA)  BASIC METABOLIC PANEL  LIPID PANEL  PROTIME-INR  HEMOGLOBIN A1C  TSH  T4, FREE    EKG None  Radiology DG Chest 2 View  Result Date: 06/25/2022 CLINICAL DATA:  Chest pain. EXAM: CHEST - 2 VIEW COMPARISON:  10/06/2020 FINDINGS: Prior median sternotomy. Mitral valve clip implantation. The heart is stable in size. No pleural effusion, focal airspace disease, pulmonary edema or pneumothorax. No acute osseous findings. IMPRESSION: No acute chest findings. Electronically Signed   By: Narda Rutherford M.D.   On: 06/25/2022 23:12    Procedures .Critical Care  Performed by: Peter Garter, PA Authorized by: Peter Garter, PA   Critical care provider statement:    Critical care time (minutes):  42   Critical care was necessary to treat or prevent imminent or life-threatening deterioration of the following conditions:  Cardiac failure   Critical care was time spent personally by me on the following activities:  Development of treatment plan with patient or surrogate, discussions with consultants, evaluation of patient's response to treatment, examination of patient, ordering and review of laboratory studies, ordering and review of radiographic studies, ordering and performing treatments and interventions, pulse oximetry, re-evaluation of patient's condition and review of old charts   I assumed direction of critical care for this patient from another provider in my specialty: yes     Care discussed with: admitting provider       Medications Ordered in ED Medications  fentaNYL (SUBLIMAZE) injection 50 mcg (has no administration in time range)  heparin ADULT infusion 100  units/mL (25000 units/254mL) (1,200 Units/hr Intravenous New Bag/Given 06/26/22 0107)  nitroGLYCERIN 50 mg in dextrose 5 % 250 mL (0.2 mg/mL) infusion (5 mcg/min Intravenous New Bag/Given 06/26/22 0311)  atorvastatin (LIPITOR) tablet 80 mg (has no administration in time range)  sacubitril-valsartan (ENTRESTO) 24-26 mg per tablet (has no administration in time range)  ezetimibe (ZETIA) tablet 10 mg (has no administration in time range)  sertraline (ZOLOFT) tablet 100 mg (has no administration in time range)  liothyronine (CYTOMEL) tablet 10 mcg (has no administration in time range)  famotidine (PEPCID) tablet 20 mg (has no administration in time range)  cholecalciferol (VITAMIN D3) 25 MCG (1000 UNIT) tablet 10,000 Units (has no administration in time range)  levothyroxine (SYNTHROID) tablet 75 mcg (has no administration in time range)  aspirin EC tablet 81 mg (has no administration in time range)  nitroGLYCERIN (NITROSTAT) SL tablet 0.4 mg (has no administration in time range)  acetaminophen (TYLENOL) tablet 650 mg (has no administration in time range)  ondansetron (ZOFRAN) injection 4 mg (has no administration in time range)  nitroGLYCERIN (  NITROGLYN) 2 % ointment 1 inch (1 inch Topical Given 06/26/22 0020)  aspirin chewable tablet 324 mg (324 mg Oral Given 06/26/22 0019)    ED Course/ Medical Decision Making/ A&P Clinical Course as of 06/26/22 0355  Wed Jun 26, 2022  1610 Consulted cardiology Dr. Juanetta Gosling who who agreed with admission and assume further treatment/care. [CR]  0245 Reevaluation of the patient showed still with chest pain.  Will begin nitroglycerin drip.  Apparently just seen by cardiology and is awaiting consultation note for further plan. [CR]    Clinical Course User Index [CR] Peter Garter, PA                             Medical Decision Making Amount and/or Complexity of Data Reviewed Labs: ordered. Radiology: ordered.  Risk OTC drugs. Prescription drug  management. Decision regarding hospitalization.   This patient presents to the ED for concern of chest pain, this involves an extensive number of treatment options, and is a complaint that carries with it a high risk of complications and morbidity.  The differential diagnosis includes ACS, PE, pneumothorax, aortic dissection, aortic aneurysm, pneumonia, musculoskeletal, GERD   Co morbidities that complicate the patient evaluation  See HPI   Additional history obtained:  Additional history obtained from EMR External records from outside source obtained and reviewed including hospital records   Lab Tests:  I Ordered, and personally interpreted labs.  The pertinent results include: Mild leukocytosis 11.5.  Mild evidence anemia with a hemoglobin of 12.9.  No platelet abnormalities.  No Electra abnormalities.  No renal dysfunction.  Initial troponin elevated at 250 with repeat 710   Imaging Studies ordered:  I ordered imaging studies including chest x-ray I independently visualized and interpreted imaging which showed no acute cardiopulmonary abnormalities I agree with the radiologist interpretation   Cardiac Monitoring: / EKG:  The patient was maintained on a cardiac monitor.  I personally viewed and interpreted the cardiac monitored which showed an underlying rhythm of: Sinus rhythm with ventricular bigeminy.  Old inferior infarct   Consultations Obtained:  See ED course  Problem List / ED Course / Critical interventions / Medication management  NSTEMI I ordered medication including nitroglycerin, aspirin, heparin   Reevaluation of the patient after these medicines showed that the patient improved I have reviewed the patients home medicines and have made adjustments as needed   Social Determinants of Health:  Former cigarette use.  Denies illicit drug use.   Test / Admission - Considered:  Chest pain, NSTEMI Vitals signs within normal range and stable throughout  visit. Laboratory/imaging studies significant for: See above 82 year old male presents emergency department with complaints of chest pain.  Patient with evidence of elevated troponin with initial of 250 with repeat 710 without obvious acute ischemic changes on EKG.  Patient with extensive cardiac history.  Current presentation concerning for NSTEMI.  Cardiology was consulted who evaluated patient at bedside and were in agreement with presumption.  Patient begun on intravenous heparin, aspirin.  Patient's chest pain refractory to topical nitroglycerin paste so patient begun on nitroglycerin drip.  Will admit to cardiology service. Treatment plan were discussed at length with patient and they knowledge understanding was agreeable to said plan.  Appropriate consultations were made as described in the ED course.  Patient was stable upon admission to the hospital.         Final Clinical Impression(s) / ED Diagnoses Final diagnoses:  Chest pain due  to myocardial ischemia, unspecified ischemic chest pain type  NSTEMI (non-ST elevated myocardial infarction) [facility])    Rx / DC Orders ED Discharge Orders     None         Peter Garter, [place] 06/26/22 0355    Gloris Manchester, MD 06/26/22 [phone]

## 2022-06-26 NOTE — Interval H&P Note (Signed)
Cath Lab Visit (complete for each Cath Lab visit)  Clinical Evaluation Leading to the Procedure:   ACS: Yes.    Non-ACS:    Anginal Classification: CCS IV  Anti-ischemic medical therapy: No Therapy  Non-Invasive Test Results: No non-invasive testing performed  Prior CABG: Previous CABG      History and Physical Interval Note:  06/26/2022 8:42 AM  Raymond Christensen  has presented today for surgery, with the diagnosis of nstemi.  The various methods of treatment have been discussed with the patient and family. After consideration of risks, benefits and other options for treatment, the patient has consented to  Procedure(s): LEFT HEART CATH AND CORS/GRAFTS ANGIOGRAPHY (N/A) as a surgical intervention.  The patient's history has been reviewed, patient examined, no change in status, stable for surgery.  I have reviewed the patient's chart and labs.  Questions were answered to the patient's satisfaction.     Nicki Guadalajara

## 2022-06-26 NOTE — Progress Notes (Signed)
  Echocardiogram 2D Echocardiogram has been performed.  Raymond Christensen 06/26/2022, 1:50 PM

## 2022-06-26 NOTE — Plan of Care (Signed)
  Problem: Education: Goal: Understanding of cardiac disease, CV risk reduction, and recovery process will improve Outcome: Progressing Goal: Individualized Educational Video(s) Outcome: Progressing   Problem: Activity: Goal: Ability to tolerate increased activity will improve Outcome: Progressing   Problem: Cardiac: Goal: Ability to achieve and maintain adequate cardiovascular perfusion will improve Outcome: Progressing   Problem: Health Behavior/Discharge Planning: Goal: Ability to safely manage health-related needs after discharge will improve Outcome: Progressing   Problem: Education: Goal: Understanding of CV disease, CV risk reduction, and recovery process will improve Outcome: Progressing Goal: Individualized Educational Video(s) Outcome: Progressing   Problem: Activity: Goal: Ability to return to baseline activity level will improve Outcome: Progressing   Problem: Cardiovascular: Goal: Ability to achieve and maintain adequate cardiovascular perfusion will improve Outcome: Progressing Goal: Vascular access site(s) Level 0-1 will be maintained Outcome: Progressing   Problem: Health Behavior/Discharge Planning: Goal: Ability to safely manage health-related needs after discharge will improve Outcome: Progressing   Problem: Education: Goal: Knowledge of General Education information will improve Description: Including pain rating scale, medication(s)/side effects and non-pharmacologic comfort measures Outcome: Progressing   Problem: Health Behavior/Discharge Planning: Goal: Ability to manage health-related needs will improve Outcome: Progressing   Problem: Clinical Measurements: Goal: Ability to maintain clinical measurements within normal limits will improve Outcome: Progressing Goal: Will remain free from infection Outcome: Progressing Goal: Diagnostic test results will improve Outcome: Progressing Goal: Respiratory complications will improve Outcome:  Progressing Goal: Cardiovascular complication will be avoided Outcome: Progressing   Problem: Activity: Goal: Risk for activity intolerance will decrease Outcome: Progressing   Problem: Nutrition: Goal: Adequate nutrition will be maintained Outcome: Progressing   Problem: Coping: Goal: Level of anxiety will decrease Outcome: Progressing   Problem: Elimination: Goal: Will not experience complications related to bowel motility Outcome: Progressing Goal: Will not experience complications related to urinary retention Outcome: Progressing   Problem: Pain Managment: Goal: General experience of comfort will improve Outcome: Progressing   Problem: Safety: Goal: Ability to remain free from injury will improve Outcome: Progressing   Problem: Skin Integrity: Goal: Risk for impaired skin integrity will decrease Outcome: Progressing   

## 2022-06-26 NOTE — Progress Notes (Signed)
Pts son Joesph Marcy would like a call after PT evaluates pt. His number is [phone].

## 2022-06-26 NOTE — Progress Notes (Signed)
 Rounding Note    Patient Name: Raymond Christensen Date of Encounter: 06/26/2022  [facility] Cardiologist: Mahesh A Chandrasekhar, MD   Subjective   Patient still complaining of chest pain 2-3/10 on IV Ntg and heparin. No dyspnea. Denies nausea or diaphoresis. Has been having chest pain for the past 2 days but worsened last night.   Inpatient Medications    Scheduled Meds:  [START ON 06/27/2022] aspirin EC  81 mg Oral Daily   atorvastatin  80 mg Oral Daily   cholecalciferol  10,000 Units Oral Daily   ezetimibe  10 mg Oral Daily   famotidine  20 mg Oral Daily   levothyroxine  75 mcg Oral Q0600   liothyronine  10 mcg Oral Daily   sacubitril-valsartan  1 tablet Oral BID   sertraline  100 mg Oral Daily   Continuous Infusions:  heparin 1,200 Units/hr (06/26/22 0107)   nitroGLYCERIN 15 mcg/min (06/26/22 0623)   PRN Meds: acetaminophen, fentaNYL (SUBLIMAZE) injection, nitroGLYCERIN, ondansetron (ZOFRAN) IV   Vital Signs    Vitals:   06/26/22 0627 06/26/22 0630 06/26/22 0645 06/26/22 0700  BP: 123/60 137/78 120/73 122/72  Pulse: 69 62 73 72  Resp: 17 15 15 18  Temp:      TempSrc:      SpO2: 99% 99% 93% 94%  Weight:       No intake or output data in the 24 hours ending 06/26/22 0732    06/26/2022   12:06 AM 05/31/2022   10:45 AM 05/29/2022   11:13 AM  Last 3 Weights  Weight (lbs) 207 lb 218 lb 218 lb 6 oz  Weight (kg) 93.895 kg 98.884 kg 99.054 kg      Telemetry    NSR with frequent PVCs and some AIVR - Personally Reviewed  ECG    NSR rate 67. PVCs in pattern of bigeminy. Subtle ST elevation 3 and AVF compared to baseline.  - Personally Reviewed  Physical Exam   GEN: obese elderly WM in NAD Neck: No JVD Cardiac: RRR, no murmurs, rubs, or gallops.  Respiratory: Clear to auscultation bilaterally. GI: Soft, nontender, non-distended  MS: No edema; No deformity. Neuro:  Nonfocal  Psych: Normal affect   Labs    High Sensitivity Troponin:   Recent  Labs  Lab 06/25/22 2244 06/26/22 0034  TROPONINIHS 250* 710*     Chemistry Recent Labs  Lab 06/25/22 2244 06/26/22 0445  NA 135 134*  K 3.7 3.7  CL 104 103  CO2 23 21*  GLUCOSE 86 91  BUN 13 16  CREATININE 1.09 0.85  CALCIUM 8.9 8.6*  GFRNONAA >60 >60  ANIONGAP 8 10    Lipids  Recent Labs  Lab 06/26/22 0445  CHOL 85  TRIG 53  HDL 40*  LDLCALC 34  CHOLHDL 2.1    Hematology Recent Labs  Lab 06/25/22 2244 06/26/22 0445  WBC 11.5* 9.4  RBC 4.69 4.51  HGB 12.9* 12.3*  HCT 40.4 39.9  MCV 86.1 88.5  MCH 27.5 27.3  MCHC 31.9 30.8  RDW 14.9 14.7  PLT 157 120*   Thyroid  Recent Labs  Lab 06/26/22 0445  TSH 2.028  FREET4 0.76    BNP Recent Labs  Lab 06/25/22 2244  BNP 88.4    DDimer No results for input(s): "DDIMER" in the last 168 hours.   Radiology    DG Chest 2 View  Result Date: 06/25/2022 CLINICAL DATA:  Chest pain. EXAM: CHEST - 2 VIEW COMPARISON:  10/06/2020 FINDINGS:   Prior median sternotomy. Mitral valve clip implantation. The heart is stable in size. No pleural effusion, focal airspace disease, pulmonary edema or pneumothorax. No acute osseous findings. IMPRESSION: No acute chest findings. Electronically Signed   By: Melanie  Sanford M.D.   On: 06/25/2022 23:12    Cardiac Studies   Event monitor 06/13/22: Study Highlights      Patient had a minimum heart rate of 51 bpm, maximum heart rate of 169 bpm, and average heart rate of 70 bpm.   Predominant underlying rhythm was sinus rhythm.   One 12 beat run of NSVT.   Short runs of regular, relatively slow SVT, with longest lasting 16 seconds at ~ 118 bpm.   Isolated PACs were rare (<1.0%).   Isolated PVCs were frequent (10%).   No symptoms triggered.   Asymptomatic frequent PVCs.  Patient Profile     82 y.o. male with known coronary disease and prior CABG, stenting of SVG,  hyperlipidemia, A-fib s/p ablation, diabetes who is being seen 06/26/2022 for the evaluation of NSTEMI.    Assessment  & Plan    NSTEMI. Troponin elevated to 710. Ecg with subtle changes inferiorly compared to baseline. Patient with remote CABG in 2001 with LIMA to LAD and sequential SVG to OM and right PDA. S/p stenting of SVG x 2 in 2021 with DES 3.0 x 18 Onyx and 2.75 x 16 mm Synergy. Of note SVG was somewhat difficult to engage but was engaged with a RCB guide. Patient's last Eliquis dose was yesterday am. He is having ongoing chest pain despite IV heparin and Ntg. Need to proceed with invasive evaluation with cardiac cath urgently this am. Suspect the SVG is again the culprit. Discussed with patient in detail and he is agreeable to proceed. Given difficulty engaging SVG before would favor femoral approach. Echo pending to assess LV. Chronic systolic CHF. Last EF 50-55% S/p mitra clip in 2021 Afib s/p ablation. In NSR. Eliquis on hold for cath procedure. DM on SSI Hypothyroidism Carotid arterial disease s/p L CEA. Known right subclavian stenosis HLD well controlled on statin OSA on CPAP  10. Frequent PVCs.      For questions or updates, please contact [facility] Please consult [email or web address] for contact info under        Signed, Joziyah Roblero, MD  06/26/2022, 7:32 AM    

## 2022-06-26 NOTE — Progress Notes (Signed)
ANTICOAGULATION CONSULT NOTE - Initial Consult  Pharmacy Consult for heparin Indication: chest pain/ACS  Allergies  Allergen Reactions   Sulfa Antibiotics Rash    Questionable, he developed a diffuse rash 2 days after stopping Bactrim    Patient Measurements: Heparin Dosing Weight: 93.9kg  Vital Signs: Temp: 97.9 F (36.6 C) (06/11 2240) Temp Source: Oral (06/11 2240) BP: 147/75 (06/11 2240) Pulse Rate: 70 (06/11 2240)  Labs: Recent Labs    06/25/22 2244  HGB 12.9*  HCT 40.4  PLT 157  LABPROT 14.7  INR 1.1  CREATININE 1.09  TROPONINIHS 250*    CrCl cannot be calculated (Unknown ideal weight.).   Medical History: Past Medical History:  Diagnosis Date   Arthritis    CAD (coronary artery disease)    Carotid artery disease (HCC)    s/p left CEA 01/17/14   Chronic systolic CHF (congestive heart failure) (HCC)    Concussion Summer 2012   Essential hypertension 02/26/2017   Heart disease    Hyperlipidemia    Hypothyroidism 10/15/2017   Major depressive disorder    Mitral valve regurgitation    Myocardial infarction (HCC) 1996, 2021   Obstructive sleep apnea on CPAP    Persistent atrial fibrillation (HCC)    Poor flexibility of tendon 12/01/2018   S/P mitral valve clip implantation 12/23/2019   s/p TEER with MitraClip with one XTW by Dr. Excell Seltzer   Subungual hematoma of digit of hand 12/01/2018   Subungual hematoma of right foot 12/01/2018   Thyroid disease    Type 2 diabetes mellitus without complication, without long-term current use of insulin (HCC) 02/26/2017   Assessment: 81 yoM who presents with chest pain radiating to right arm and shoulder that started on 6/10 with mild SOB. Patient states he took 1 SL NTG with mild relief. Pharmacy consulted to dose heparin for ACS/STEMI. PTA elqiuis taken 6/11 AM, Hgb 12.9, plts 157, troponins 250.  Given last dose of eliquis 6/11 AM, will not bolus and monitor with aPTT  Goal of Therapy:  aPTT 66-102 seconds Monitor  platelets by anticoagulation protocol: Yes   Plan:  Start heparin infusion at 1200 units/hr Check anti-Xa + aPTT level in 8 hours and daily while on heparin until levels correlate Continue to monitor H&H and platelets Monitor for signs/symptoms of bleeding  Thanks,  Arabella Merles, PharmD. [facility] Acute Care PGY-1 06/26/2022 12:46 AM

## 2022-06-26 NOTE — H&P (Signed)
Cardiology Admission History and Physical   Patient ID: Raymond Christensen MRN: [id number]; DOB: 1941-01-02   Admission date: 06/25/2022  PCP:  Sharlene Dory, DO    [facility] Providers Cardiologist:  Christell Constant, MD  Electrophysiologist:  Will Jorja Loa, MD       Chief Complaint: Chest pain  Patient Profile:   Raymond Christensen is a 82 y.o. male with known coronary disease and prior CABG, hyperlipidemia, A-fib, diabetes who is being seen 06/26/2022 for the evaluation of NSTEMI.  History of Present Illness:   Raymond Christensen known coronary artery disease with prior CABG in 2001 (LIMA to LAD, SVG to OM2, SVG to right PDA, SVG to D2) with PCI of vein graft to OM in 2021, carotid disease status post left carotid endarterectomy and right splenius stenosis, diabetes, hypertension, A-fib who presents emergency department with 1 day of persistent chest pain.  Notes that the pain is different from last experienced as a sense of heaviness is moving across his chest.  It continued to wax and wane and get worse and thus he presented to emergency department.  Fortunately he was hemodynamically stable.  The workup in the emergency department, he was found to have NSTEMI with troponin initially 250 >> 710.  The rest of his workup was rather unremarkable.  He was started on heparin drip and nitroglycerin for chest pain.  When I saw him he was still having some chest pain but had improved some.  Admitted to cardiology for further management.   Past Medical History:  Diagnosis Date   Arthritis    CAD (coronary artery disease)    Carotid artery disease (HCC)    s/p left CEA 01/17/14   Chronic systolic CHF (congestive heart failure) (HCC)    Concussion Summer 2012   Essential hypertension 02/26/2017   Heart disease    Hyperlipidemia    Hypothyroidism 10/15/2017   Major depressive disorder    Mitral valve regurgitation    Myocardial infarction (HCC) 1996, 2021   Obstructive  sleep apnea on CPAP    Persistent atrial fibrillation (HCC)    Poor flexibility of tendon 12/01/2018   S/P mitral valve clip implantation 12/23/2019   s/p TEER with MitraClip with one XTW by Dr. Excell Seltzer   Subungual hematoma of digit of hand 12/01/2018   Subungual hematoma of right foot 12/01/2018   Thyroid disease    Type 2 diabetes mellitus without complication, without long-term current use of insulin (HCC) 02/26/2017    Past Surgical History:  Procedure Laterality Date   ATRIAL FIBRILLATION ABLATION N/A 02/01/2020   Procedure: ATRIAL FIBRILLATION ABLATION;  Surgeon: Regan Lemming, MD;  Location: [facility] INVASIVE CV LAB;  Service: Cardiovascular;  Laterality: N/A;   BYPASS GRAFT  2001   CARDIAC CATHETERIZATION     CARDIOVERSION N/A 11/08/2019   Procedure: CARDIOVERSION;  Surgeon: Wendall Stade, MD;  Location: [facility] ENDOSCOPY;  Service: Cardiovascular;  Laterality: N/A;   CAROTID ENDARTERECTOMY Left 01/17/2014   CORONARY ANGIOPLASTY     CORONARY ARTERY BYPASS GRAFT     MITRAL VALVE REPAIR N/A 12/23/2019   Procedure: MITRAL VALVE REPAIR;  Surgeon: Tonny Bollman, MD;  Location: [facility] INVASIVE CV LAB;  Service: Cardiovascular;  Laterality: N/A;   RIGHT/LEFT HEART CATH AND CORONARY/GRAFT ANGIOGRAPHY N/A 09/09/2019   Procedure: RIGHT/LEFT HEART CATH AND CORONARY/GRAFT ANGIOGRAPHY;  Surgeon: Runell Gess, MD;  Location: [facility] INVASIVE CV LAB;  Service: Cardiovascular;  Laterality: N/A;   TEE WITHOUT CARDIOVERSION N/A 09/13/2019   Procedure:  TRANSESOPHAGEAL ECHOCARDIOGRAM (TEE);  Surgeon: Jodelle Red, MD;  Location: [facility] ENDOSCOPY;  Service: Cardiovascular;  Laterality: N/A;   TEE WITHOUT CARDIOVERSION N/A 12/23/2019   Procedure: TRANSESOPHAGEAL ECHOCARDIOGRAM (TEE);  Surgeon: Tonny Bollman, MD;  Location: [facility] INVASIVE CV LAB;  Service: Cardiovascular;  Laterality: N/A;   TEE WITHOUT CARDIOVERSION  02/01/2020   Procedure: TRANSESOPHAGEAL ECHOCARDIOGRAM (TEE);  Surgeon: Regan Lemming, MD;  Location: [facility] INVASIVE CV LAB;  Service: Cardiovascular;;   TOTAL HIP ARTHROPLASTY  1994, 1995     Medications Prior to Admission: Prior to Admission medications   Medication Sig Start Date End Date Taking? Authorizing Provider  acetaminophen (TYLENOL) 500 MG tablet Take 1,000 mg by mouth every 6 (six) hours as needed for moderate pain or headache.   Yes [provider]  atorvastatin (LIPITOR) 80 MG tablet TAKE 1 TABLET DAILY Patient taking differently: Take 80 mg by mouth daily. 11/15/21  Yes Lyn Records, MD  ELIQUIS 5 MG TABS tablet TAKE 1 TABLET TWICE A DAY 09/14/21  Yes Lyn Records, MD  ENTRESTO 24-26 MG TAKE 1 TABLET TWICE A DAY Patient taking differently: Take 1 tablet by mouth 2 (two) times daily. 09/14/21  Yes Lyn Records, MD  ezetimibe (ZETIA) 10 MG tablet Take 1 tablet (10 mg total) by mouth daily. 05/20/22  Yes Chandrasekhar, Mahesh A, MD  famotidine (PEPCID) 20 MG tablet Take 1 tablet (20 mg total) by mouth daily. 09/07/19  Yes Saguier, Ramon Dredge, PA-C  levothyroxine (SYNTHROID) 75 MCG tablet Take 1 tablet (75 mcg total) by mouth daily. 12/27/21  Yes Wendling, Jilda Roche, DO  liothyronine (CYTOMEL) 5 MCG tablet TAKE 2 TABLETS DAILY Patient taking differently: Take 10 mcg by mouth daily. 10/15/21  Yes Wendling, Jilda Roche, DO  Multiple Vitamins-Minerals (MULTIVITAMIN ADULT EXTRA C PO) Take 1 tablet by mouth daily.    Yes [provider]  nitroGLYCERIN (NITROSTAT) 0.4 MG SL tablet Place 1 tablet (0.4 mg total) under the tongue every 5 (five) minutes as needed for chest pain. 06/21/22  Yes Sharlene Dory, DO  Semaglutide, 2 MG/DOSE, 8 MG/3ML SOPN Inject 2 mg as directed once a week. Patient taking differently: Inject 2 mg as directed once a week. Sunday 05/03/22  Yes Sharlene Dory, DO  sertraline (ZOLOFT) 100 MG tablet Take 0.5 tablets (50 mg total) by mouth daily. Patient taking differently: Take 100 mg by mouth daily. 05/29/22  Yes  Sharlene Dory, DO  Cholecalciferol (VITAMIN D3) 250 MCG (10000 UT) capsule Take 10,000 Units by mouth daily.     [provider]  metoprolol tartrate (LOPRESSOR) 25 MG tablet 1/2 pill twice daily Patient not taking: Reported on 06/26/2022 06/14/22   Christell Constant, MD     Allergies:    Allergies  Allergen Reactions   Sulfa Antibiotics Rash    Questionable, he developed a diffuse rash 2 days after stopping Bactrim    Social History:   Social History   Socioeconomic History   Marital status: Married    Spouse name: Not on file   Number of children: 2   Years of education: 19   Highest education level: Doctorate  Occupational History   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  Tobacco Use   Smoking status: Former    Types: Cigarettes    Quit date: 1980    Years since quitting: 44.4   Smokeless tobacco: Never  Vaping Use   Vaping Use: Never used  Substance and Sexual Activity   Alcohol use: Yes  Alcohol/week: 4.0 standard drinks of alcohol    Types: 1 Cans of beer, 3 Standard drinks or equivalent per week    Comment: 1 drink 3 times per week   Drug use: No   Sexual activity: Yes  Other Topics Concern   Not on file  Social History Narrative   Pt is R handed   Lives in single story home with his wife, Okey Regal   Has 2 adult children   PhD in Biology   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 [employer]    Social Determinants of Health   Financial Resource Strain: Low Risk  (07/27/2021)   Overall Financial Resource Strain (CARDIA)    Difficulty of Paying Living Expenses: Not hard at all  Food Insecurity: No Food Insecurity (07/27/2021)   Hunger Vital Sign    Worried About Running Out of Food in the Last Year: Never true    Ran Out of Food in the Last Year: Never true  Transportation Needs: No Transportation Needs (07/27/2021)   PRAPARE - Administrator, Civil Service (Medical): No    Lack of Transportation (Non-Medical): No  Physical Activity:  Sufficiently Active (07/27/2021)   Exercise Vital Sign    Days of Exercise per Week: 7 days    Minutes of Exercise per Session: 30 min  Stress: No Stress Concern Present (07/27/2021)   [employer] of Occupational Health - Occupational Stress Questionnaire    Feeling of Stress : Not at all  Social Connections: Socially Integrated (07/27/2021)   Social Connection and Isolation Panel [NHANES]    Frequency of Communication with Friends and Family: More than three times a week    Frequency of Social Gatherings with Friends and Family: More than three times a week    Attends Religious Services: More than 4 times per year    Active Member of [employer] or Organizations: Yes    Attends Banker Meetings: More than 4 times per year    Marital Status: Married  Catering manager Violence: Not At Risk (07/27/2021)   Humiliation, Afraid, Rape, and Kick questionnaire    Fear of Current or Ex-Partner: No    Emotionally Abused: No    Physically Abused: No    Sexually Abused: No    Family History:   The patient's family history includes Dementia in his father. There is no history of Cancer.    ROS:  Please see the history of present illness.  All other ROS reviewed and negative.     Physical Exam/Data:   Vitals:   06/26/22 0130 06/26/22 0215 06/26/22 0245 06/26/22 0300  BP: 130/77 (!) 145/73 137/75 (!) 148/64  Pulse: 66 65 68 75  Resp: 18 18 13 16   Temp:      TempSrc:      SpO2: 97% 97% 100% 99%  Weight:       No intake or output data in the 24 hours ending 06/26/22 0334    06/26/2022   12:06 AM 05/31/2022   10:45 AM 05/29/2022   11:13 AM  Last 3 Weights  Weight (lbs) 207 lb 218 lb 218 lb 6 oz  Weight (kg) 93.895 kg 98.884 kg 99.054 kg     Body mass index is 40.43 kg/m.  General:  Well nourished, well developed, in no acute distress HEENT: normal Neck: no JVD Vascular: No carotid bruits; Distal pulses 2+ bilaterally   Cardiac:  normal S1, S2; RRR; soft systolic  murmur Lungs:  clear to auscultation bilaterally, no wheezing,  rhonchi or rales  Abd: soft, nontender, no hepatomegaly  Ext: no edema Musculoskeletal:  No deformities, BUE and BLE strength normal and equal Skin: warm and dry  Neuro:  CNs 2-12 intact, no focal abnormalities noted Psych:  Normal affect    EKG:  The ECG that was done  was personally reviewed and demonstrates nonspecific T wave changes  Relevant CV Studies: None  Laboratory Data:  High Sensitivity Troponin:   Recent Labs  Lab 06/25/22 2244 06/26/22 0034  TROPONINIHS 250* 710*      Chemistry Recent Labs  Lab 06/25/22 2244  NA 135  K 3.7  CL 104  CO2 23  GLUCOSE 86  BUN 13  CREATININE 1.09  CALCIUM 8.9  GFRNONAA >60  ANIONGAP 8    No results for input(s): "PROT", "ALBUMIN", "AST", "ALT", "ALKPHOS", "BILITOT" in the last 168 hours. Lipids No results for input(s): "CHOL", "TRIG", "HDL", "LABVLDL", "LDLCALC", "CHOLHDL" in the last 168 hours. Hematology Recent Labs  Lab 06/25/22 2244  WBC 11.5*  RBC 4.69  HGB 12.9*  HCT 40.4  MCV 86.1  MCH 27.5  MCHC 31.9  RDW 14.9  PLT 157   Thyroid No results for input(s): "TSH", "FREET4" in the last 168 hours. BNP Recent Labs  Lab 06/25/22 2244  BNP 88.4    DDimer No results for input(s): "DDIMER" in the last 168 hours.   Radiology/Studies:  DG Chest 2 View  Result Date: 06/25/2022 CLINICAL DATA:  Chest pain. EXAM: CHEST - 2 VIEW COMPARISON:  10/06/2020 FINDINGS: Prior median sternotomy. Mitral valve clip implantation. The heart is stable in size. No pleural effusion, focal airspace disease, pulmonary edema or pneumothorax. No acute osseous findings. IMPRESSION: No acute chest findings. Electronically Signed   By: Narda Rutherford M.D.   On: 06/25/2022 23:12     Assessment and Plan:   # NSTEMI.  Has known coronary disease with CABG in 2001 and prior vein graft PCI.  Symptoms worse than prior with marked troponin elevation from 250>>710. Will need  coronary angiography for further evaluation.   - npo after midnight for lhc tomorrow - echo ordered for tomorrow - heparin for anticoagulation - loaded 325 mg ASA; continue 81 mg daily  - hold off on p2y12i until cath - continue high intensity statin with atorvastatin 80mg  and ezetimibe; check lipid panel and LP(a) - hold on beta blocker therapy for now  - plan to start ACEi/ARB post cath - will check CBC, CMP, INR, hemoglobin A1c, tsh/FT4 - referral for cardiac rehab - admit for cardiac tele; strict I&Os; daily weights  - nitro gtt due to ongoing pain  # HF with recovered EF Does not appear to be in clinical heart failure.  Will continue meds. -Continue Entresto  #hypothyroidism Continue synthroid and liothyronine  # Depression -Continue sertraline  # Diabetes -Monitor blood sugar  # GERD Continue famotidine    Risk Assessment/Risk Scores:    TIMI Risk Score for Unstable Angina or Non-ST Elevation MI:   The patient's TIMI risk score is 6, which indicates a 41% risk of all cause mortality, new or recurrent myocardial infarction or need for urgent revascularization in the next 14 days.    CHA2DS2-VASc Score = 6   This indicates a 9.7% annual risk of stroke. The patient's score is based upon: CHF History: 1 HTN History: 1 Diabetes History: 1 Stroke History: 0 Vascular Disease History: 1 Age Score: 2 Gender Score: 0      Severity of Illness: The appropriate patient  status for this patient is INPATIENT. Inpatient status is judged to be reasonable and necessary in order to provide the required intensity of service to ensure the patient's safety. The patient's presenting symptoms, physical exam findings, and initial radiographic and laboratory data in the context of their chronic comorbidities is felt to place them at high risk for further clinical deterioration. Furthermore, it is not anticipated that the patient will be medically stable for discharge from the  hospital within 2 midnights of admission.   * I certify that at the point of admission it is my clinical judgment that the patient will require inpatient hospital care spanning beyond 2 midnights from the point of admission due to high intensity of service, high risk for further deterioration and high frequency of surveillance required.*   For questions or updates, please contact [facility] Please consult [email or web address] for contact info under     Signed, Joellen Jersey, MD  06/26/2022 3:34 AM

## 2022-06-26 NOTE — Progress Notes (Signed)
ANTICOAGULATION CONSULT NOTE  Pharmacy Consult for heparin Indication: chest pain/ACS  Allergies  Allergen Reactions   Sulfa Antibiotics Rash    Questionable, he developed a diffuse rash 2 days after stopping Bactrim    Patient Measurements: Heparin Dosing Weight: 93.9kg  Vital Signs: Temp: 98.2 F (36.8 C) (06/12 1248) Temp Source: Oral (06/12 1248) BP: 118/71 (06/12 1248) Pulse Rate: 63 (06/12 1248)  Labs: Recent Labs    06/25/22 2244 06/26/22 0034 06/26/22 0445  HGB 12.9*  --  12.3*  HCT 40.4  --  39.9  PLT 157  --  120*  LABPROT 14.7  --  15.1  INR 1.1  --  1.2  CREATININE 1.09  --  0.85  TROPONINIHS 250* 710* 1,478*     Estimated Creatinine Clearance: 75.7 mL/min (by C-G formula based on SCr of 0.85 mg/dL).   Medical History: Past Medical History:  Diagnosis Date   Arthritis    CAD (coronary artery disease)    Carotid artery disease (HCC)    s/p left CEA 01/17/14   Chronic systolic CHF (congestive heart failure) (HCC)    Concussion Summer 2012   Essential hypertension 02/26/2017   Heart disease    Hyperlipidemia    Hypothyroidism 10/15/2017   Major depressive disorder    Mitral valve regurgitation    Myocardial infarction (HCC) 1996, 2021   Obstructive sleep apnea on CPAP    Persistent atrial fibrillation (HCC)    Poor flexibility of tendon 12/01/2018   S/P mitral valve clip implantation 12/23/2019   s/p TEER with MitraClip with one XTW by Dr. Excell Seltzer   Subungual hematoma of digit of hand 12/01/2018   Subungual hematoma of right foot 12/01/2018   Thyroid disease    Type 2 diabetes mellitus without complication, without long-term current use of insulin (HCC) 02/26/2017   Assessment: 81 yoM who presents with chest pain radiating to right arm and shoulder that started on 6/10 with mild SOB. Patient states he took 1 SL NTG with mild relief. Pharmacy consulted to dose heparin for ACS/STEMI. PTA apixaban taken 6/11 AM.  Cath today finding severe  multivessel CAD - plan for medical management. Plan to restart apixaban 8 hours after sheath removed (documented on 6/12@0957 ).   Goal of Therapy:  Heparin level: 0.3-0.7 aPTT 66-102 seconds Monitor platelets by anticoagulation protocol: Yes   Plan:  Restart heparin infusion at 1200 units/hr on 6/12@1800  Check anti-Xa + aPTT level in 8 hours and daily while on heparin until levels correlate Continue to monitor H&H and platelets Monitor for signs/symptoms of bleeding  Thank you for allowing pharmacy to participate in this patient's care,  Sherron Monday, PharmD, BCCCP Clinical Pharmacist  Phone: [phone] 06/26/2022 1:53 PM  Please check AMION for all [facility] Pharmacy phone numbers After 10:00 PM, call Main Pharmacy [phone]

## 2022-06-26 NOTE — H&P (View-Only) (Signed)
Rounding Note    Patient Name: Raymond Christensen Date of Encounter: 06/26/2022  [facility] Cardiologist: Christell Constant, MD   Subjective   Patient still complaining of chest pain 2-3/10 on IV Ntg and heparin. No dyspnea. Denies nausea or diaphoresis. Has been having chest pain for the past 2 days but worsened last night.   Inpatient Medications    Scheduled Meds:  [START ON 06/27/2022] aspirin EC  81 mg Oral Daily   atorvastatin  80 mg Oral Daily   cholecalciferol  10,000 Units Oral Daily   ezetimibe  10 mg Oral Daily   famotidine  20 mg Oral Daily   levothyroxine  75 mcg Oral Q0600   liothyronine  10 mcg Oral Daily   sacubitril-valsartan  1 tablet Oral BID   sertraline  100 mg Oral Daily   Continuous Infusions:  heparin 1,200 Units/hr (06/26/22 0107)   nitroGLYCERIN 15 mcg/min (06/26/22 0623)   PRN Meds: acetaminophen, fentaNYL (SUBLIMAZE) injection, nitroGLYCERIN, ondansetron (ZOFRAN) IV   Vital Signs    Vitals:   06/26/22 0627 06/26/22 0630 06/26/22 0645 06/26/22 0700  BP: 123/60 137/78 120/73 122/72  Pulse: 69 62 73 72  Resp: 17 15 15 18   Temp:      TempSrc:      SpO2: 99% 99% 93% 94%  Weight:       No intake or output data in the 24 hours ending 06/26/22 0732    06/26/2022   12:06 AM 05/31/2022   10:45 AM 05/29/2022   11:13 AM  Last 3 Weights  Weight (lbs) 207 lb 218 lb 218 lb 6 oz  Weight (kg) 93.895 kg 98.884 kg 99.054 kg      Telemetry    NSR with frequent PVCs and some AIVR - Personally Reviewed  ECG    NSR rate 67. PVCs in pattern of bigeminy. Subtle ST elevation 3 and AVF compared to baseline.  - Personally Reviewed  Physical Exam   GEN: obese elderly WM in NAD Neck: No JVD Cardiac: RRR, no murmurs, rubs, or gallops.  Respiratory: Clear to auscultation bilaterally. GI: Soft, nontender, non-distended  MS: No edema; No deformity. Neuro:  Nonfocal  Psych: Normal affect   Labs    High Sensitivity Troponin:   Recent  Labs  Lab 06/25/22 2244 06/26/22 0034  TROPONINIHS 250* 710*     Chemistry Recent Labs  Lab 06/25/22 2244 06/26/22 0445  NA 135 134*  K 3.7 3.7  CL 104 103  CO2 23 21*  GLUCOSE 86 91  BUN 13 16  CREATININE 1.09 0.85  CALCIUM 8.9 8.6*  GFRNONAA >60 >60  ANIONGAP 8 10    Lipids  Recent Labs  Lab 06/26/22 0445  CHOL 85  TRIG 53  HDL 40*  LDLCALC 34  CHOLHDL 2.1    Hematology Recent Labs  Lab 06/25/22 2244 06/26/22 0445  WBC 11.5* 9.4  RBC 4.69 4.51  HGB 12.9* 12.3*  HCT 40.4 39.9  MCV 86.1 88.5  MCH 27.5 27.3  MCHC 31.9 30.8  RDW 14.9 14.7  PLT 157 120*   Thyroid  Recent Labs  Lab 06/26/22 0445  TSH 2.028  FREET4 0.76    BNP Recent Labs  Lab 06/25/22 2244  BNP 88.4    DDimer No results for input(s): "DDIMER" in the last 168 hours.   Radiology    DG Chest 2 View  Result Date: 06/25/2022 CLINICAL DATA:  Chest pain. EXAM: CHEST - 2 VIEW COMPARISON:  10/06/2020 FINDINGS:  Prior median sternotomy. Mitral valve clip implantation. The heart is stable in size. No pleural effusion, focal airspace disease, pulmonary edema or pneumothorax. No acute osseous findings. IMPRESSION: No acute chest findings. Electronically Signed   By: Narda Rutherford M.D.   On: 06/25/2022 23:12    Cardiac Studies   Event monitor 06/13/22: Study Highlights      Patient had a minimum heart rate of 51 bpm, maximum heart rate of 169 bpm, and average heart rate of 70 bpm.   Predominant underlying rhythm was sinus rhythm.   One 12 beat run of NSVT.   Short runs of regular, relatively slow SVT, with longest lasting 16 seconds at ~ 118 bpm.   Isolated PACs were rare (<1.0%).   Isolated PVCs were frequent (10%).   No symptoms triggered.   Asymptomatic frequent PVCs.  Patient Profile     82 y.o. male with known coronary disease and prior CABG, stenting of SVG,  hyperlipidemia, A-fib s/p ablation, diabetes who is being seen 06/26/2022 for the evaluation of NSTEMI.    Assessment  & Plan    NSTEMI. Troponin elevated to 710. Ecg with subtle changes inferiorly compared to baseline. Patient with remote CABG in 2001 with LIMA to LAD and sequential SVG to OM and right PDA. S/p stenting of SVG x 2 in 2021 with DES 3.0 x 18 Onyx and 2.75 x 16 mm Synergy. Of note SVG was somewhat difficult to engage but was engaged with a RCB guide. Patient's last Eliquis dose was yesterday am. He is having ongoing chest pain despite IV heparin and Ntg. Need to proceed with invasive evaluation with cardiac cath urgently this am. Suspect the SVG is again the culprit. Discussed with patient in detail and he is agreeable to proceed. Given difficulty engaging SVG before would favor femoral approach. Echo pending to assess LV. Chronic systolic CHF. Last EF 50-55% S/p mitra clip in 2021 Afib s/p ablation. In NSR. Eliquis on hold for cath procedure. DM on SSI Hypothyroidism Carotid arterial disease s/p L CEA. Known right subclavian stenosis HLD well controlled on statin OSA on CPAP  10. Frequent PVCs.      For questions or updates, please contact [facility] Please consult [email or web address] for contact info under        Signed, Tobie Hellen [place], MD  06/26/2022, 7:32 AM

## 2022-06-26 NOTE — Progress Notes (Signed)
Site area: Right groin a 5 french arterial sheath was removed  Site Prior to Removal:  Level 0  Pressure Applied For 30 MINUTES    Bedrest Beginning at 1100am X 6 hours  Manual:   Yes.    Patient Status During Pull:  stable  Post Pull Groin Site:  Level 0  Post Pull Instructions Given:  Yes.    Post Pull Pulses Present:  Yes.    Dressing Applied:  Yes.    Comments:

## 2022-06-27 ENCOUNTER — Encounter (HOSPITAL_COMMUNITY): Payer: Self-pay | Admitting: Cardiovascular Disease

## 2022-06-27 ENCOUNTER — Other Ambulatory Visit (HOSPITAL_COMMUNITY): Payer: Self-pay

## 2022-06-27 DIAGNOSIS — I214 Non-ST elevation (NSTEMI) myocardial infarction: Secondary | ICD-10-CM | POA: Diagnosis not present

## 2022-06-27 DIAGNOSIS — Z951 Presence of aortocoronary bypass graft: Secondary | ICD-10-CM

## 2022-06-27 DIAGNOSIS — E876 Hypokalemia: Secondary | ICD-10-CM | POA: Insufficient documentation

## 2022-06-27 LAB — CBC
HCT: 38.3 % — ABNORMAL LOW (ref 39.0–52.0)
Hemoglobin: 12.4 g/dL — ABNORMAL LOW (ref 13.0–17.0)
MCH: 27.4 pg (ref 26.0–34.0)
MCHC: 32.4 g/dL (ref 30.0–36.0)
MCV: 84.5 fL (ref 80.0–100.0)
Platelets: 149 10*3/uL — ABNORMAL LOW (ref 150–400)
RBC: 4.53 MIL/uL (ref 4.22–5.81)
RDW: 14.9 % (ref 11.5–15.5)
WBC: 9.2 10*3/uL (ref 4.0–10.5)
nRBC: 0 % (ref 0.0–0.2)

## 2022-06-27 LAB — BASIC METABOLIC PANEL
Anion gap: 6 (ref 5–15)
BUN: 7 mg/dL — ABNORMAL LOW (ref 8–23)
CO2: 24 mmol/L (ref 22–32)
Calcium: 8.6 mg/dL — ABNORMAL LOW (ref 8.9–10.3)
Chloride: 105 mmol/L (ref 98–111)
Creatinine, Ser: 0.8 mg/dL (ref 0.61–1.24)
GFR, Estimated: >60 mL/min (ref >60)
Glucose, Bld: 107 mg/dL — ABNORMAL HIGH (ref 70–99)
Potassium: 3.3 mmol/L — ABNORMAL LOW (ref 3.5–5.1)
Sodium: 135 mmol/L (ref 135–145)

## 2022-06-27 LAB — APTT: aPTT: 68 seconds — ABNORMAL HIGH (ref 24–36)

## 2022-06-27 LAB — GLUCOSE, CAPILLARY
Glucose-Capillary: 99 mg/dL (ref 70–99)
Glucose-Capillary: 106 mg/dL — ABNORMAL HIGH (ref 70–99)

## 2022-06-27 LAB — HEPARIN LEVEL (UNFRACTIONATED): Heparin Unfractionated: 0.36 IU/mL (ref 0.30–0.70)

## 2022-06-27 MED ORDER — ISOSORBIDE MONONITRATE ER 30 MG PO TB24
15.0000 mg | ORAL_TABLET | Freq: Every day | ORAL | 2 refills | Status: DC
  Filled 2022-06-27: qty 15, 30d supply, fill #0

## 2022-06-27 MED ORDER — APIXABAN 5 MG PO TABS
5.0000 mg | ORAL_TABLET | Freq: Two times a day (BID) | ORAL | Status: DC
  Administered 2022-06-27: 5 mg via ORAL
  Filled 2022-06-27: qty 1

## 2022-06-27 MED ORDER — METOPROLOL SUCCINATE ER 25 MG PO TB24
12.5000 mg | ORAL_TABLET | Freq: Every day | ORAL | Status: DC
  Administered 2022-06-27: 12.5 mg via ORAL
  Filled 2022-06-27: qty 1

## 2022-06-27 MED ORDER — SPIRONOLACTONE 12.5 MG HALF TABLET: 12.5000 mg | ORAL_TABLET | Freq: Every day | ORAL | Status: DC

## 2022-06-27 MED ORDER — ISOSORBIDE MONONITRATE ER 30 MG PO TB24
15.0000 mg | ORAL_TABLET | Freq: Every day | ORAL | Status: DC
  Administered 2022-06-27: 15 mg via ORAL
  Filled 2022-06-27: qty 1

## 2022-06-27 MED ORDER — METOPROLOL SUCCINATE ER 25 MG PO TB24
12.5000 mg | ORAL_TABLET | Freq: Every day | ORAL | 2 refills | Status: DC
  Filled 2022-06-27: qty 15, 30d supply, fill #0

## 2022-06-27 MED ORDER — POTASSIUM CHLORIDE CRYS ER 20 MEQ PO TBCR
40.0000 meq | EXTENDED_RELEASE_TABLET | Freq: Once | ORAL | Status: AC
  Administered 2022-06-27: 40 meq via ORAL
  Filled 2022-06-27: qty 2

## 2022-06-27 MED FILL — Heparin Sodium (Porcine) Inj 1000 Unit/ML: INTRAMUSCULAR | Qty: 10 | Status: AC

## 2022-06-27 MED FILL — Nitroglycerin IV Soln 100 MCG/ML in D5W: INTRA_ARTERIAL | Qty: 10 | Status: AC

## 2022-06-27 NOTE — Progress Notes (Signed)
CARDIAC REHAB PHASE I   PRE:  Rate/Rhythm: 76 paced   BP:  Sitting: 123/76      SaO2: 96 RA  MODE:  Ambulation: 350 ft   AD:   no AD  POST:  Rate/Rhythm: 84 paced  BP:  Sitting: 115/75      SaO2: 96 RA  Pt amb with standby assistance, pt denies CP and SOB during amb and was returned to room w/o complaint.   Pt walked well, isolated PVCs seen at rest  Pt was educated on Eliquis and ASA use, wt restrictions, no baths/daily wash-ups, s/s of infection, ex guidelines, s/s to stop exercising, NTG use and calling 911, heart healthy and diabetic diet, risk factors and CRPII. Pt received MI book and materials on exercise, diet, and CRPII. Will refer to [facility].   Pt has been through CR before, wants to do it again.   Referral placed to: [facility] under cardiologist Dr. Christell Constant  Qualifying Dx: 6/11 NSTEMI  Faustino Congress  MS, ACSM-CEP 9:03 AM 06/27/2022    Service time is from 0829 to 0910.

## 2022-06-27 NOTE — Plan of Care (Signed)
Problem: Education: Goal: Understanding of cardiac disease, CV risk reduction, and recovery process will improve 06/27/2022 1448 by Herma Carson, RN Outcome: Adequate for Discharge 06/27/2022 1448 by Herma Carson, RN Outcome: Adequate for Discharge 06/27/2022 0845 by Herma Carson, RN Outcome: Progressing Goal: Individualized Educational Video(s) 06/27/2022 1448 by Herma Carson, RN Outcome: Adequate for Discharge 06/27/2022 1448 by Herma Carson, RN Outcome: Adequate for Discharge 06/27/2022 0845 by Herma Carson, RN Outcome: Progressing   Problem: Activity: Goal: Ability to tolerate increased activity will improve 06/27/2022 1448 by Herma Carson, RN Outcome: Adequate for Discharge 06/27/2022 1448 by Herma Carson, RN Outcome: Adequate for Discharge 06/27/2022 0845 by Herma Carson, RN Outcome: Progressing   Problem: Cardiac: Goal: Ability to achieve and maintain adequate cardiovascular perfusion will improve 06/27/2022 1448 by Herma Carson, RN Outcome: Adequate for Discharge 06/27/2022 1448 by Herma Carson, RN Outcome: Adequate for Discharge 06/27/2022 0845 by Herma Carson, RN Outcome: Progressing   Problem: Health Behavior/Discharge Planning: Goal: Ability to safely manage health-related needs after discharge will improve 06/27/2022 1448 by Herma Carson, RN Outcome: Adequate for Discharge 06/27/2022 1448 by Herma Carson, RN Outcome: Adequate for Discharge 06/27/2022 0845 by Herma Carson, RN Outcome: Progressing   Problem: Education: Goal: Understanding of CV disease, CV risk reduction, and recovery process will improve 06/27/2022 1448 by Herma Carson, RN Outcome: Adequate for Discharge 06/27/2022 1448 by Herma Carson, RN Outcome: Adequate for Discharge 06/27/2022 0845 by Herma Carson, RN Outcome: Progressing Goal: Individualized Educational Video(s) 06/27/2022 1448 by Herma Carson, RN Outcome: Adequate for Discharge 06/27/2022  1448 by Herma Carson, RN Outcome: Adequate for Discharge 06/27/2022 0845 by Herma Carson, RN Outcome: Progressing   Problem: Activity: Goal: Ability to return to baseline activity level will improve 06/27/2022 1448 by Herma Carson, RN Outcome: Adequate for Discharge 06/27/2022 1448 by Herma Carson, RN Outcome: Adequate for Discharge 06/27/2022 0845 by Herma Carson, RN Outcome: Progressing   Problem: Cardiovascular: Goal: Ability to achieve and maintain adequate cardiovascular perfusion will improve 06/27/2022 1448 by Herma Carson, RN Outcome: Adequate for Discharge 06/27/2022 1448 by Herma Carson, RN Outcome: Adequate for Discharge 06/27/2022 0845 by Herma Carson, RN Outcome: Progressing Goal: Vascular access site(s) Level 0-1 will be maintained 06/27/2022 1448 by Herma Carson, RN Outcome: Adequate for Discharge 06/27/2022 1448 by Herma Carson, RN Outcome: Adequate for Discharge 06/27/2022 0845 by Herma Carson, RN Outcome: Progressing   Problem: Health Behavior/Discharge Planning: Goal: Ability to safely manage health-related needs after discharge will improve 06/27/2022 1448 by Herma Carson, RN Outcome: Adequate for Discharge 06/27/2022 1448 by Herma Carson, RN Outcome: Adequate for Discharge 06/27/2022 0845 by Herma Carson, RN Outcome: Progressing   Problem: Education: Goal: Knowledge of General Education information will improve Description: Including pain rating scale, medication(s)/side effects and non-pharmacologic comfort measures 06/27/2022 1448 by Herma Carson, RN Outcome: Adequate for Discharge 06/27/2022 1448 by Herma Carson, RN Outcome: Adequate for Discharge 06/27/2022 0845 by Herma Carson, RN Outcome: Progressing   Problem: Health Behavior/Discharge Planning: Goal: Ability to manage health-related needs will improve 06/27/2022 1448 by Herma Carson, RN Outcome: Adequate for Discharge 06/27/2022 1448 by Herma Carson, RN Outcome: Adequate for Discharge 06/27/2022 0845 by Herma Carson, RN Outcome: Progressing   Problem: Clinical Measurements: Goal: Ability to maintain clinical measurements within normal limits will improve 06/27/2022 1448 by  Herma Carson, RN Outcome: Adequate for Discharge 06/27/2022 1448 by Herma Carson, RN Outcome: Adequate for Discharge 06/27/2022 0845 by Herma Carson, RN Outcome: Progressing Goal: Will remain free from infection 06/27/2022 1448 by Herma Carson, RN Outcome: Adequate for Discharge 06/27/2022 1448 by Herma Carson, RN Outcome: Adequate for Discharge 06/27/2022 0845 by Herma Carson, RN Outcome: Progressing Goal: Diagnostic test results will improve 06/27/2022 1448 by Herma Carson, RN Outcome: Adequate for Discharge 06/27/2022 1448 by Herma Carson, RN Outcome: Adequate for Discharge 06/27/2022 0845 by Herma Carson, RN Outcome: Progressing Goal: Respiratory complications will improve 06/27/2022 1448 by Herma Carson, RN Outcome: Adequate for Discharge 06/27/2022 1448 by Herma Carson, RN Outcome: Adequate for Discharge 06/27/2022 0845 by Herma Carson, RN Outcome: Progressing Goal: Cardiovascular complication will be avoided 06/27/2022 1448 by Herma Carson, RN Outcome: Adequate for Discharge 06/27/2022 1448 by Herma Carson, RN Outcome: Adequate for Discharge 06/27/2022 0845 by Herma Carson, RN Outcome: Progressing   Problem: Activity: Goal: Risk for activity intolerance will decrease 06/27/2022 1448 by Herma Carson, RN Outcome: Adequate for Discharge 06/27/2022 1448 by Herma Carson, RN Outcome: Adequate for Discharge 06/27/2022 0845 by Herma Carson, RN Outcome: Progressing   Problem: Nutrition: Goal: Adequate nutrition will be maintained 06/27/2022 1448 by Herma Carson, RN Outcome: Adequate for Discharge 06/27/2022 1448 by Herma Carson, RN Outcome: Adequate for Discharge 06/27/2022 0845 by Herma Carson, RN Outcome: Progressing   Problem: Coping: Goal: Level of anxiety will decrease 06/27/2022 1448 by Herma Carson, RN Outcome: Adequate for Discharge 06/27/2022 1448 by Herma Carson, RN Outcome: Adequate for Discharge 06/27/2022 0845 by Herma Carson, RN Outcome: Progressing   Problem: Elimination: Goal: Will not experience complications related to bowel motility 06/27/2022 1448 by Herma Carson, RN Outcome: Adequate for Discharge 06/27/2022 1448 by Herma Carson, RN Outcome: Adequate for Discharge 06/27/2022 0845 by Herma Carson, RN Outcome: Progressing Goal: Will not experience complications related to urinary retention 06/27/2022 1448 by Herma Carson, RN Outcome: Adequate for Discharge 06/27/2022 1448 by Herma Carson, RN Outcome: Adequate for Discharge 06/27/2022 0845 by Herma Carson, RN Outcome: Progressing   Problem: Pain Managment: Goal: General experience of comfort will improve 06/27/2022 1448 by Herma Carson, RN Outcome: Adequate for Discharge 06/27/2022 1448 by Herma Carson, RN Outcome: Adequate for Discharge 06/27/2022 0845 by Herma Carson, RN Outcome: Progressing   Problem: Safety: Goal: Ability to remain free from injury will improve 06/27/2022 1448 by Herma Carson, RN Outcome: Adequate for Discharge 06/27/2022 1448 by Herma Carson, RN Outcome: Adequate for Discharge 06/27/2022 0845 by Herma Carson, RN Outcome: Progressing   Problem: Skin Integrity: Goal: Risk for impaired skin integrity will decrease 06/27/2022 1448 by Herma Carson, RN Outcome: Adequate for Discharge 06/27/2022 1448 by Herma Carson, RN Outcome: Adequate for Discharge 06/27/2022 0845 by Herma Carson, RN Outcome: Progressing

## 2022-06-27 NOTE — Discharge Summary (Signed)
Discharge Summary    Patient ID: Raymond Christensen MRN: [id number]; DOB: 08/15/1940  Admit date: 06/25/2022 Discharge date: 06/27/2022  PCP:  Sharlene Dory, DO   [facility] Providers Cardiologist:  Christell Constant, MD  Electrophysiologist:  Regan Lemming, MD  {  Discharge Diagnoses    Principal Problem:   NSTEMI (non-ST elevated myocardial infarction) [facility]) Active Problems:   CAD (coronary artery disease)   S/P CABG (coronary artery bypass graft)   Persistent atrial fibrillation (HCC)   Chronic HFrEF   Essential hypertension   Hypothyroidism   Obstructive sleep apnea on CPAP   Type 2 diabetes mellitus with obesity (HCC)   S/P mitral valve clip implantation   Mitral valve disease   Hyperlipidemia   Hypokalemia    Diagnostic Studies/Procedures    Left Cardiac Catheterization 06/26/2022:   Mid LM to Dist LM lesion is 70% stenosed.   Mid RCA to Dist RCA lesion is 100% stenosed.   Prox RCA to Mid RCA lesion is 90% stenosed.   2nd Diag lesion is 75% stenosed.   1st Mrg lesion is 75% stenosed.   2nd Mrg-1 lesion is 80% stenosed.   1st Diag lesion is 40% stenosed.   Mid LAD lesion is 75% stenosed.   2nd Mrg-2 lesion is 100% stenosed.   Prox Graft to Mid Graft lesion before 2nd Mrg  is 95% stenosed.   Dist Graft to Insertion lesion before 2nd Mrg  is 85% stenosed.   Prox Graft to Mid Graft lesion between 2nd Mrg and RPDA  is 100% stenosed.   Dist Graft to Insertion lesion between 2nd Mrg and RPDA  is 100% stenosed.   There is moderate left ventricular systolic dysfunction.   LV end diastolic pressure is mildly elevated.   The left ventricular ejection fraction is 35-45% by visual estimate.   Recommend to resume Apixaban, at currently prescribed dose and frequency.   Recommend concurrent antiplatelet therapy of Aspirin 81 mg daily.   Severe multivessel CAD with 70% eccentric distal left main stenosis; 40% stenosis in the first diagonal  vessel, 75 to 80% stenosis in the LAD between the first and second diagonal vessel with diffuse 70% stenosis in the second diagonal vessel.  There is competitive filling of the mid LAD due to LIMA graft flow.   Diffuse irregularity of the left circumflex vessel with 20% proximal stenosis, 70% ostial stenosis in the first marginal branch, small second marginal branch with mid occlusion, and 50% mid AV groove circumflex stenosis.   Previously noted high-grade mid RCA stenosis with total occlusion before the acute margin.  There are faint collaterals supplying the very distal RCA via the left coronary injection.   Patent LIMA graft supplying the mid LAD.   Severely diseased questional SVG with new 95% proximal graft stenosis, extensive thrombus with 85% stenosis in the stent in the region of the OM 2 vessel, and total occlusion of the more distal stent in the vein graft with complete occlusion of the distal graft prior to anastomosing into the distal RCA.   Moderate LV dysfunction with EF estimate approximately 35 to 45%.  LVEDP 17 mm.   RECOMMENDATION: Angiograms were reviewed with Dr. [place].  Medical therapy is recommended.  Initiate anti-ischemic medication and guideline directed medical therapy for reduced LV function/ischemic cardiomyopathy.  Plan to resume Eliquis tomorrow.   Diagnostic Dominance: Right  _____________   Echocardiogram 06/26/2022: Impressions: 1. Left ventricular ejection fraction, by estimation, is 45 to 50%. The  left ventricle has mildly decreased function. The left ventricle  demonstrates global hypokinesis. Left ventricular diastolic parameters are  indeterminate.   2. Right ventricular systolic function is normal. The right ventricular  size is normal. Tricuspid regurgitation signal is inadequate for assessing  PA pressure.   3. 2D double orifice area 6.21 cm2. Stable MitraClip position with MR  medial to the clip. The mitral valve has been repaired/replaced.  Mild to  moderate mitral valve regurgitation. No evidence of mitral stenosis. There  is a Mitra-Clip present in the  mitral position. Procedure Date: 12/23/2019.   4. The aortic valve is tricuspid. Aortic valve regurgitation is trivial.  Aortic valve sclerosis/calcification is present, without any evidence of  aortic stenosis.   5. The inferior vena cava is dilated in size with >50% respiratory  variability, suggesting right atrial pressure of 8 mmHg.   History of Present Illness     Raymond Christensen is a 82 y.o. male with a history of CAD s/p remote CABG in 2001 (LIMA-LAD, SVG-OM2, SVG-RPDA, SVG-D2) and DES x2 to sequential SVG supplying the OM and PDA in 08/2019, chronic HFrEF with EF as low as 35-40% in 08/2019 but improved to 50-55% in 12/2020, paroxysmal atrial fibrillation s/p ablation in 2022, mitral regurgitation s/p MitraClip in 12/2019, carotid stenosis s/p left CEA and right subclavian stenosis, hypertension, hyperlipidemia, type 2 diabetes mellitus, hypothyroidism, and obstructive sleep apnea who was admitted on 06/25/2022 for NSTEMI after presenting with chest pain that he describes as a heaviness.  Hospital Course     Consultants: None   NSTEMI CAD s/p CABG and PCI Patient has a history of CAD s/p remote CABG in 2001 with subsequent PCI with DES x2 to sequential SVG to OM and PDA in 08/2019. He now presents with chest heaviness and ruled in for NSTEMI. High-sensitivity troponin elevated at 250 >> 710 >> 1,478. He underwent LHC on 06/26/2022 which showed severe native CAD with patient LIMA to LAD and severely diseased sequential SVG to OM and RPDA with a new 95% proximal graft stenosis, extensive thrombus with 85% in-stent restenosis of prior stent, and total occlusion of more distal stent. LVEF 35-45% and LVEDP 17 mmHg. Medical therapy was recommended. Echo showed LVEF of 45-50% with global hypokinesis. He had some mild chest heaviness overnight. Therefore, he was started on Imdur 15mg   daily and Toprol-XL 12.5mg  daily (he was previously on Lopressor 12.5mg  twice daily but states this was stopped due to low BP). Continue home Lipitor and Zetia. Home Eliquis was restarted on day of discharge.  Chronic HFrEF History of chronic HFrEF with EF of 35-40% in 2021. EF later normalized to 50-55%. Echo this admission showed LVEF of 45-50% with global hypokinesis and normal RV. LVEDP mildly elevated at 17 mmHg. Continue Entresto 24-26mg  twice daily. Will start Imdur 15mg  daily and Toprol-XL 12.5mg  daily for additional antianginal benefits. Could consider adding Spironolactone or SGLT2 inhibitor as an outpatient.  Paroxysmal Atrial Fibrillation s/p Ablation Frequent PAC/ PVCs Patient has a history of PAF s/p ablation in 01/2020. He has remained in sinus rhythm this admission but has frequent PACs/ PVCs noted on telemetry. Recent outpatient monitor showed 10% PVC burden. He was started on Lopressor but states that caused his BP to drop too low. We currently of BP and HR room to add a low dose beta-blocker; therefore, will discharge him on Toprol-XL 12.5mg  daily. Home Eliquis was initially held in anticipation of cardiac catheterization but was restarted on day of discharge.  Mitral Regurgitation S/p  MitraClip in 12/2019 Echo this admission showed stable stable MitraClip position with MR medial to the clip and mild to moderate MR. Can continue t monitor as an outpatient.  Hypertension BP mostly well controlled this admission. Continue home Entresto 24-26mg  twice daily. Will add low dose Imdur 15mg  daily and Toprol-XL 12.5mg  daily for additional antianginal affect and to help with frequent PVCs as above. Patient has a BP monitor at home. Advised him to monitor BP closely after discharge and let us know if BP is dropping to low with these new medications.  Hyperlipidemia Lipid panel this admission: Total Cholesterol 85, Triglycerides 53, HDL 40, LDL 34. Continue home Lipitor 80mg  daily and Zetia  10mg  daily.  Type 2 Diabetes Mellitus Hemoglobin A1c 5.4% this admission. Continue home Semaglutide at discharge.  Hypothyroidism Continue home Synthroid and Liothyronine.  Obstructive Sleep Apnea Continue CPAP.  Hypokalemia Potassium 3.3 on day of discharge. Repleted. Can repeat BMET at follow-up visit.   Falls Patient's son expressed concerns about patient falling at home. Therefore, Case Manager and PT were consulted and home health RN, PT, and Social Work were ordered at discharge.  Patient seen and examined by Dr. [place] today and felt to be stable for discharge. Outpatient follow-up arranged. Medications as below.     Did the patient have an acute coronary syndrome (MI, NSTEMI, STEMI, etc) this admission?:  Yes                               AHA/ACC Clinical Performance & Quality Measures: Aspirin prescribed? - No - no PCI and on Eliquis for atrial fibrillation ADP Receptor Inhibitor (Plavix/Clopidogrel, Brilinta/Ticagrelor or Effient/Prasugrel) prescribed (includes medically managed patients)? - No - no PCI and on Eliquis for atrial fibrillation Beta Blocker prescribed? - Yes High Intensity Statin (Lipitor 40-80mg  or Crestor 20-40mg ) prescribed? - Yes EF assessed during THIS hospitalization? - Yes For EF <40%, was ACEI/ARB prescribed? - Not Applicable (EF >/= 40%) For EF <40%, Aldosterone Antagonist (Spironolactone or Eplerenone) prescribed? - Not Applicable (EF >/= 40%) Cardiac Rehab Phase II ordered (including medically managed patients)? - Yes   _____________  Discharge Vitals Blood pressure 131/77, pulse 74, temperature 98.2 F (36.8 C), temperature source Oral, resp. rate 15, height 5\' 7"  (1.702 m), weight 94 kg, SpO2 98 %.  Filed Weights   06/26/22 0006 06/26/22 1248 06/27/22 0430  Weight: 93.9 kg 93.9 kg 94 kg   General: 82 y.o. Caucasian male resting comfortably in no acute distress. HEENT: Normocephalic and atraumatic. Sclera clear.  Neck: Supple.  No  JVD. Heart: RRR with frequent ectopy. Distinct S1 and S2. No murmurs, gallops, or rubs. Radial and distal pedal pulses 2+ and equal bilaterally. Lungs: No increased work of breathing. Clear to ausculation bilaterally. No wheezes, rhonchi, or rales.  Abdomen: Soft, non-distended, and non-tender to palpation. Extremities: No lower extremity edema.  Right femoral cath site is soft and stable with no signs of hematoma. Skin: Warm and dry. Neuro: Alert and oriented x3. No focal deficits. Psych: Normal affect. Responds appropriately.  Labs & Radiologic Studies    CBC Recent Labs    06/26/22 0445 06/27/22 0145  WBC 9.4 9.2  HGB 12.3* 12.4*  HCT 39.9 38.3*  MCV 88.5 84.5  PLT 120* 149*   Basic Metabolic Panel Recent Labs    16/10/96 0445 06/27/22 0145  NA 134* 135  K 3.7 3.3*  CL 103 105  CO2 21* 24  GLUCOSE 91  107*  BUN 16 7*  CREATININE 0.85 0.80  CALCIUM 8.6* 8.6*   Liver Function Tests No results for input(s): "AST", "ALT", "ALKPHOS", "BILITOT", "PROT", "ALBUMIN" in the last 72 hours. No results for input(s): "LIPASE", "AMYLASE" in the last 72 hours. High Sensitivity Troponin:   Recent Labs  Lab 06/25/22 2244 06/26/22 0034 06/26/22 0445  TROPONINIHS 250* 710* 1,478*    BNP Invalid input(s): "POCBNP" D-Dimer No results for input(s): "DDIMER" in the last 72 hours. Hemoglobin A1C Recent Labs    06/26/22 0445  HGBA1C 5.4   Fasting Lipid Panel Recent Labs    06/26/22 0445  CHOL 85  HDL 40*  LDLCALC 34  TRIG 53  CHOLHDL 2.1   Thyroid Function Tests Recent Labs    06/26/22 0445  TSH 2.028   _____________  ECHOCARDIOGRAM COMPLETE  Result Date: 06/26/2022    ECHOCARDIOGRAM REPORT   Patient Name:   GOBLE ESTELA Date of Exam: 06/26/2022 Medical Rec #:  [id number]      Height:       60.0 in Accession #:    [id number]     Weight:       207.0 lb Date of Birth:  October 20, 1940      BSA:          1.894 m Patient Age:    81 years       BP:           124/80 mmHg  Patient Gender: M              HR:           69 bpm. Exam Location:  Inpatient Procedure: 2D Echo, Cardiac Doppler and Color Doppler Indications:    I25.110 Atherosclerotic heart disease of coronary artery with                 unstable angina pectoris; I25-125.9 Ischemic heart disease  History:        Patient has prior history of Echocardiogram examinations, most                 recent 01/03/2021. CHF, CAD and Previous Myocardial Infarction,                 Prior CABG and Abnormal ECG, Mitral Valve Disease,                 Arrythmias:Atrial Fibrillation, Signs/Symptoms:Syncope; Risk                 Factors:Hypertension, Diabetes and Sleep Apnea. Mitra Clip.                  Mitral Valve: Mitra-Clip valve is present in the mitral                 position. Procedure Date: 12/23/2019.  Sonographer:    Sheralyn Boatman RDCS Referring Phys: [id number] [facility] NARCISSE JR  Sonographer Comments: Patient supine post PCI. IMPRESSIONS  1. Left ventricular ejection fraction, by estimation, is 45 to 50%. The left ventricle has mildly decreased function. The left ventricle demonstrates global hypokinesis. Left ventricular diastolic parameters are indeterminate.  2. Right ventricular systolic function is normal. The right ventricular size is normal. Tricuspid regurgitation signal is inadequate for assessing PA pressure.  3. 2D double orifice area 6.21 cm2. Stable MitraClip position with MR medial to the clip. The mitral valve has been repaired/replaced. Mild to moderate mitral valve regurgitation. No evidence of mitral stenosis. There is a Mitra-Clip present in the mitral position. Procedure  Date: 12/23/2019.  4. The aortic valve is tricuspid. Aortic valve regurgitation is trivial. Aortic valve sclerosis/calcification is present, without any evidence of aortic stenosis.  5. The inferior vena cava is dilated in size with >50% respiratory variability, suggesting right atrial pressure of 8 mmHg. FINDINGS  Left Ventricle: Left ventricular  ejection fraction, by estimation, is 45 to 50%. The left ventricle has mildly decreased function. The left ventricle demonstrates global hypokinesis. The left ventricular internal cavity size was normal in size. There is  no left ventricular hypertrophy. Left ventricular diastolic function could not be evaluated due to mitral valve repair. Left ventricular diastolic parameters are indeterminate.  LV Wall Scoring: The mid inferoseptal segment and apical septal segment are akinetic. The inferior wall, mid anteroseptal segment, mid inferolateral segment, and basal inferoseptal segment are hypokinetic. The entire anterior wall, antero-lateral wall, basal anteroseptal segment, basal inferolateral segment, apical lateral segment, and apical inferior segment are normal. Right Ventricle: The right ventricular size is normal. No increase in right ventricular wall thickness. Right ventricular systolic function is normal. Tricuspid regurgitation signal is inadequate for assessing PA pressure. Left Atrium: Left atrial size was normal in size. Right Atrium: Right atrial size was normal in size. Pericardium: There is no evidence of pericardial effusion. Mitral Valve: 2D double orifice area 6.21 cm2. Stable MitraClip position with MR medial to the clip. The mitral valve has been repaired/replaced. Mild to moderate mitral valve regurgitation. There is a Mitra-Clip XTW present in the mitral position. Procedure Date: 12/23/2019. No evidence of mitral valve stenosis. Tricuspid Valve: The tricuspid valve is not well visualized. Tricuspid valve regurgitation is not demonstrated. No evidence of tricuspid stenosis. Aortic Valve: The aortic valve is tricuspid. There is moderate aortic valve annular calcification. Aortic valve regurgitation is trivial. Aortic valve sclerosis/calcification is present, without any evidence of aortic stenosis. Pulmonic Valve: The pulmonic valve was normal in structure. Pulmonic valve regurgitation is trivial.  No evidence of pulmonic stenosis. Aorta: The aortic root is normal in size and structure. Venous: The inferior vena cava is dilated in size with greater than 50% respiratory variability, suggesting right atrial pressure of 8 mmHg. IAS/Shunts: No atrial level shunt detected by color flow Doppler.  LEFT VENTRICLE PLAX 2D LVIDd:         5.40 cm      Diastology LVIDs:         4.00 cm      LV e' medial:    5.98 cm/s LV PW:         1.10 cm      LV E/e' medial:  13.8 LV IVS:        0.90 cm      LV e' lateral:   12.30 cm/s LVOT diam:     2.40 cm      LV E/e' lateral: 6.7 LV SV:         76 LV SV Index:   40 LVOT Area:     4.52 cm  LV Volumes (MOD) LV vol d, MOD A2C: 160.0 ml LV vol d, MOD A4C: 167.0 ml LV vol s, MOD A2C: 73.5 ml LV vol s, MOD A4C: 85.9 ml LV SV MOD A2C:     86.5 ml LV SV MOD A4C:     167.0 ml LV SV MOD BP:      82.4 ml RIGHT VENTRICLE             IVC RV S prime:     10.90 cm/s  IVC diam: 2.50  cm TAPSE (M-mode): 1.9 cm LEFT ATRIUM           Index        RIGHT ATRIUM           Index LA diam:      4.40 cm 2.32 cm/m   RA Area:     19.20 cm LA Vol (A2C): 46.6 ml 24.60 ml/m  RA Volume:   49.10 ml  25.92 ml/m LA Vol (A4C): 51.6 ml 27.24 ml/m  AORTIC VALVE             PULMONIC VALVE LVOT Vmax:   78.50 cm/s  PR End Diast Vel: 1.88 msec LVOT Vmean:  49.550 cm/s LVOT VTI:    0.168 m  AORTA Ao Root diam: 3.10 cm Ao Asc diam:  3.30 cm MITRAL VALVE MV Area (PHT): 3.62 cm       SHUNTS MV Decel Time: 210 msec       Systemic VTI:  0.17 m MR Peak grad:    92.5 mmHg    Systemic Diam: 2.40 cm MR Mean grad:    67.0 mmHg MR Vmax:         481.00 cm/s MR Vmean:        399.0 cm/s MR PISA:         2.26 cm MR PISA Eff ROA: 18 mm MR PISA Radius:  0.60 cm MV E velocity: 82.80 cm/s MV A velocity: 80.45 cm/s MV E/A ratio:  1.03 [name] MD Electronically signed by [name] MD Signature Date/Time: 06/26/2022/2:51:11 PM    Final    CARDIAC CATHETERIZATION  Result Date: 06/26/2022   Mid LM to Dist LM  lesion is 70% stenosed.   Mid RCA to Dist RCA lesion is 100% stenosed.   Prox RCA to Mid RCA lesion is 90% stenosed.   2nd Diag lesion is 75% stenosed.   1st Mrg lesion is 75% stenosed.   2nd Mrg-1 lesion is 80% stenosed.   1st Diag lesion is 40% stenosed.   Mid LAD lesion is 75% stenosed.   2nd Mrg-2 lesion is 100% stenosed.   Prox Graft to Mid Graft lesion before 2nd Mrg  is 95% stenosed.   Dist Graft to Insertion lesion before 2nd Mrg  is 85% stenosed.   Prox Graft to Mid Graft lesion between 2nd Mrg and RPDA  is 100% stenosed.   Dist Graft to Insertion lesion between 2nd Mrg and RPDA  is 100% stenosed.   There is moderate left ventricular systolic dysfunction.   LV end diastolic pressure is mildly elevated.   The left ventricular ejection fraction is 35-45% by visual estimate.   Recommend to resume Apixaban, at currently prescribed dose and frequency.   Recommend concurrent antiplatelet therapy of Aspirin 81 mg daily. Severe multivessel CAD with 70% eccentric distal left main stenosis; 40% stenosis in the first diagonal vessel, 75 to 80% stenosis in the LAD between the first and second diagonal vessel with diffuse 70% stenosis in the second diagonal vessel.  There is competitive filling of the mid LAD due to LIMA graft flow. Diffuse irregularity of the left circumflex vessel with 20% proximal stenosis, 70% ostial stenosis in the first marginal branch, small second marginal branch with mid occlusion, and 50% mid AV groove circumflex stenosis. Previously noted high-grade mid RCA stenosis with total occlusion before the acute margin.  There are faint collaterals supplying the very distal RCA via the left coronary injection. Patent LIMA graft supplying the mid LAD. Severely diseased  questional SVG with new 95% proximal graft stenosis, extensive thrombus with 85% stenosis in the stent in the region of the OM 2 vessel, and total occlusion of the more distal stent in the vein graft with complete occlusion of the distal  graft prior to anastomosing into the distal RCA. Moderate LV dysfunction with EF estimate approximately 35 to 45%.  LVEDP 17 mm. RECOMMENDATION: Angiograms were reviewed with Dr. [place].  Medical therapy is recommended.  Initiate anti-ischemic medication and guideline directed medical therapy for reduced LV function/ischemic cardiomyopathy.  Plan to resume Eliquis tomorrow.   DG Chest 2 View  Result Date: 06/25/2022 CLINICAL DATA:  Chest pain. EXAM: CHEST - 2 VIEW COMPARISON:  10/06/2020 FINDINGS: Prior median sternotomy. Mitral valve clip implantation. The heart is stable in size. No pleural effusion, focal airspace disease, pulmonary edema or pneumothorax. No acute osseous findings. IMPRESSION: No acute chest findings. Electronically Signed   By: Narda Rutherford M.D.   On: 06/25/2022 23:12   LONG TERM MONITOR (3-14 DAYS)  Result Date: 06/13/2022   Patient had a minimum heart rate of 51 bpm, maximum heart rate of 169 bpm, and average heart rate of 70 bpm.   Predominant underlying rhythm was sinus rhythm.   One 12 beat run of NSVT.   Short runs of regular, relatively slow SVT, with longest lasting 16 seconds at ~ 118 bpm.   Isolated PACs were rare (<1.0%).   Isolated PVCs were frequent (10%).   No symptoms triggered. Asymptomatic frequent PVCs.  Disposition   Patient is being discharged home today in good condition.  Follow-up Plans & Appointments     Follow-up Information     Sharlene Dory, PA-C Follow up.   Specialty: Cardiology Why: Hospital follow-up with Cardiology scheduled for 07/04/2022 at 8:50am. Please arrive 15 minutes early for check-in. If this date/ time does not work for you, please call our office to reschedule. Contact information: [address] [place] [address] [phone]                Discharge Instructions     Amb Referral to Cardiac Rehabilitation   Complete by: As directed    Diagnosis: NSTEMI   After initial evaluation and assessments  completed: Virtual Based Care may be provided alone or in conjunction with Phase 2 Cardiac Rehab based on patient barriers.: Yes   Intensive Cardiac Rehabilitation (ICR) [facility] location only OR Traditional Cardiac Rehabilitation (TCR) *If criteria for ICR are not met will enroll in TCR [facility] only): Yes   Diet - low sodium heart healthy   Complete by: As directed    Increase activity slowly   Complete by: As directed         Discharge Medications   Allergies as of 06/27/2022       Reactions   Sulfa Antibiotics Rash   Questionable, he developed a diffuse rash 2 days after stopping Bactrim        Medication List     STOP taking these medications    metoprolol tartrate 25 MG tablet Commonly known as: LOPRESSOR       TAKE these medications    acetaminophen 500 MG tablet Commonly known as: TYLENOL Take 1,000 mg by mouth every 6 (six) hours as needed for moderate pain or headache.   atorvastatin 80 MG tablet Commonly known as: LIPITOR TAKE 1 TABLET DAILY   Eliquis 5 MG Tabs tablet Generic drug: apixaban TAKE 1 TABLET TWICE A DAY   Entresto 24-26  MG Generic drug: sacubitril-valsartan TAKE 1 TABLET TWICE A DAY   ezetimibe 10 MG tablet Commonly known as: ZETIA Take 1 tablet (10 mg total) by mouth daily.   famotidine 20 MG tablet Commonly known as: PEPCID Take 1 tablet (20 mg total) by mouth daily.   isosorbide mononitrate 30 MG 24 hr tablet Commonly known as: IMDUR Take 0.5 tablets (15 mg total) by mouth daily.   levothyroxine 75 MCG tablet Commonly known as: SYNTHROID Take 1 tablet (75 mcg total) by mouth daily.   liothyronine 5 MCG tablet Commonly known as: CYTOMEL TAKE 2 TABLETS DAILY   metoprolol succinate 25 MG 24 hr tablet Commonly known as: TOPROL-XL Take 0.5 tablets (12.5 mg total) by mouth daily.   MULTIVITAMIN ADULT EXTRA C PO Take 1 tablet by mouth daily.   nitroGLYCERIN 0.4 MG SL tablet Commonly known as: NITROSTAT Place 1 tablet (0.4 mg  total) under the tongue every 5 (five) minutes as needed for chest pain.   Semaglutide (2 MG/DOSE) 8 MG/3ML Sopn Inject 2 mg as directed once a week. What changed: additional instructions   sertraline 100 MG tablet Commonly known as: ZOLOFT Take 0.5 tablets (50 mg total) by mouth daily. What changed: how much to take   Vitamin D3 250 MCG (10000 UT) capsule Take 10,000 Units by mouth daily.           Outstanding Labs/Studies   Repeat BMET at follow-up visit next week.  Duration of Discharge Encounter   Greater than 30 minutes including physician time.  Signed, Corrin Parker, PA-C 06/27/2022, 11:25 AM

## 2022-06-27 NOTE — Progress Notes (Signed)
ANTICOAGULATION CONSULT NOTE Pharmacy Consult for heparin Indication: chest pain/ACS Brief A/P: Heparin level within goal range Continue Heparin at current rate   Allergies  Allergen Reactions   Sulfa Antibiotics Rash    Questionable, he developed a diffuse rash 2 days after stopping Bactrim    Patient Measurements: Heparin Dosing Weight: 93.9kg  Vital Signs: Temp: 98.2 F (36.8 C) (06/12 2330) Temp Source: Oral (06/12 2330) BP: 111/58 (06/12 2330) Pulse Rate: 65 (06/12 2330)  Labs: Recent Labs    06/25/22 2244 06/26/22 0034 06/26/22 0445 06/27/22 0145  HGB 12.9*  --  12.3* 12.4*  HCT 40.4  --  39.9 38.3*  PLT 157  --  120* 149*  APTT  --   --   --  68*  LABPROT 14.7  --  15.1  --   INR 1.1  --  1.2  --   HEPARINUNFRC  --   --   --  0.36  CREATININE 1.09  --  0.85 0.80  TROPONINIHS 250* 710* 1,478*  --      Estimated Creatinine Clearance: 80.4 mL/min (by C-G formula based on SCr of 0.8 mg/dL).  Assessment: 82 y.o. male with CAD s/p cath for heparin  Goal of Therapy:  Heparin level: 0.3-0.7 aPTT 66-102 seconds Monitor platelets by anticoagulation protocol: Yes   Plan:  Continue Heparin at current rate  F/U restart Eliquis  Geannie Risen, PharmD, BCPS

## 2022-06-27 NOTE — Discharge Instructions (Addendum)
Medication Changes: - START Imdur 15mg  daily. - START Toprol-XL 12.5mg  daily (this replaces the Lopressor that you were previously taking).  **Your Echo that was scheduled for 07/03/2022 has been cancelled since you had an Echo while here in the hospital.** _______________  Post NSTEMI: NO HEAVY LIFTING X 2 WEEKS. NO SEXUAL ACTIVITY X 2 WEEKS. NO DRIVING X 1 WEEK. NO SOAKING BATHS, HOT TUBS, POOLS, ETC., X 7 DAYS.  Groin Site Care: Refer to this sheet in the next few weeks. These instructions provide you with information on caring for yourself after your procedure. Your caregiver may also give you more specific instructions. Your treatment has been planned according to current medical practices, but problems sometimes occur. Call your caregiver if you have any problems or questions after your procedure. HOME CARE INSTRUCTIONS You may shower 24 hours after the procedure. Remove the bandage (dressing) and gently wash the site with plain soap and water. Gently pat the site dry.  Do not apply powder or lotion to the site.  Do not sit in a bathtub, swimming pool, or whirlpool for 5 to 7 days.  No bending, squatting, or lifting anything over 10 pounds (4.5 kg) as directed by your caregiver.  Inspect the site at least twice daily.  Do not drive home if you are discharged the same day of the procedure. Have someone else drive you.  What to expect: Any bruising will usually fade within 1 to 2 weeks.  Blood that collects in the tissue (hematoma) may be painful to the touch. It should usually decrease in size and tenderness within 1 to 2 weeks.  SEEK IMMEDIATE MEDICAL CARE IF: You have unusual pain at the groin site or down the affected leg.  You have redness, warmth, swelling, or pain at the groin site.  You have drainage (other than a small amount of blood on the dressing).  You have chills.  You have a fever or persistent symptoms for more than 72 hours.  You have a fever and your symptoms  suddenly get worse.  Your leg becomes pale, cool, tingly, or numb.  You have heavy bleeding from the site. Hold pressure on the site.

## 2022-06-27 NOTE — TOC Initial Note (Signed)
Transition of Care [facility]) - Initial/Assessment Note    Patient Details  Name: Raymond Christensen MRN: [id number] Date of Birth: [birth year]  [facility]) CM/SW Contact:    Gala Lewandowsky, RN Phone Number: 06/27/2022, 3:34 PM  Clinical Narrative: Patient presented for chest pain. PTA patient was from home with spouse and has support of son [name] that lives in [place], [place] [phone]. Patient will transition home today. Case Manager spoke with family and they are agreeable to [facility] (in network with the insurance). Start of care to begin on Sunday or Monday of next week per son. Iantha Fallen will offer the stop light program for fall risks and a program to decrease risk of readmissions. Patient will have disciplines RN/PT/SW and the SW to explore the insurance plan for additional services in the home. Case Manager called the [employer] insurance plan to get a direct number for the Case Management Department [phone] and provided to the family. Case Manager discussed personal care services for the patient and provided brochures. Family wants to wait to see the resources that the insurance provides before arranging personal care services out of pocket. No further home needs identified at this time.                    Expected Discharge Plan: Home w Home Health Services Barriers to Discharge: No Barriers Identified   Patient Goals and CMS Choice Patient states their goals for this hospitalization and ongoing recovery are:: to return home CMS Medicare.gov Compare Post Acute Care list provided to:: Patient   [place] ownership interest in [facility].provided to:: Adult Children    Expected Discharge Plan and Services In-house Referral: NA Discharge Planning Services: CM Consult Post Acute Care Choice: Home Health Living arrangements for the past 2 months: Single Family Home Expected Discharge Date: 06/27/22                         HH Arranged: RN, Disease  Management, PT, Social Work [employer] Agency: [facility] Date [facility] Agency Contacted: 06/27/22 Time HH Agency Contacted: 1400 Representative spoke with at [facility] Agency: Amy  Prior Living Arrangements/Services Living arrangements for the past 2 months: Single Family Home Lives with:: Spouse Patient language and need for interpreter reviewed:: Yes Do you feel safe going back to the place where you live?: Yes      Need for Family Participation in Patient Care: Yes (Comment) Care giver support system in place?: Yes (comment)   Criminal Activity/Legal Involvement Pertinent to Current Situation/Hospitalization: No - Comment as needed  Activities of Daily Living Home Assistive Devices/Equipment: None ADL Screening (condition at time of admission) Patient's cognitive ability adequate to safely complete daily activities?: Yes Is the patient deaf or have difficulty hearing?: No Does the patient have difficulty seeing, even when wearing glasses/contacts?: No Does the patient have difficulty concentrating, remembering, or making decisions?: No Patient able to express need for assistance with ADLs?: Yes Does the patient have difficulty dressing or bathing?: No Independently performs ADLs?: Yes (appropriate for developmental age) Does the patient have difficulty walking or climbing stairs?: No Weakness of Legs: None Weakness of Arms/Hands: None  Permission Sought/Granted Permission sought to share information with : Family Supports, Case Manager Permission granted to share information with : Yes, Verbal Permission Granted     Permission granted to share info w AGENCY: [facility]        Emotional Assessment Appearance:: Appears stated age Attitude/Demeanor/Rapport: Engaged Affect (  typically observed): Appropriate Orientation: : Oriented to Self, Oriented to Place Alcohol / Substance Use: Not Applicable Psych Involvement: No (comment)  Admission diagnosis:  NSTEMI (non-ST elevated myocardial  infarction) (HCC) [I21.4] Chest pain due to myocardial ischemia, unspecified ischemic chest pain type [I25.9] Patient Active Problem List   Diagnosis Date Noted   S/P CABG (coronary artery bypass graft) 06/27/2022   Hypokalemia 06/27/2022   NSTEMI (non-ST elevated myocardial infarction) (HCC) 06/26/2022   PVC's (premature ventricular contractions) 05/31/2022   Lipoma of right upper extremity 03/02/2021   Trigger little finger of left hand 09/29/2020   Secondary hypercoagulable state (HCC) 02/29/2020   Depression, major, single episode, moderate (HCC) 02/28/2020   Nonrheumatic tricuspid valve regurgitation    S/P mitral valve clip implantation 12/23/2019   Mitral valve disease 12/23/2019   Nonrheumatic mitral valve regurgitation    Persistent atrial fibrillation (HCC)    Chronic HFrEF    Acute idiopathic gout involving toe of left foot    Thrombocytopenia (HCC) 09/08/2019   CAD (coronary artery disease)    Type 2 diabetes mellitus with obesity (HCC) 06/22/2019   Major depressive disorder    Obstructive sleep apnea on CPAP    Hypothyroidism 10/15/2017   Subclavian artery stenosis, right (HCC) 06/15/2017   Severe obesity (BMI 35.0-39.9) with comorbidity (HCC) 02/26/2017   Essential hypertension 02/26/2017   Memory change 01/10/2017   Urge incontinence of urine 01/10/2017   Lightheaded 06/25/2016   AK (actinic keratosis) 01/03/2016   H/O syncope 03/06/2014   Occlusion and stenosis of left carotid artery 01/11/2014   Routine history and physical examination of adult 11/08/2013   History of repair of hip joint 04/20/2013   Hyperlipidemia 04/20/2013   Presence of artificial hip joint 04/20/2013   Vitamin D deficiency 06/18/2010   Concussion 01/24/2010   Elevated prostate specific antigen (PSA) 01/01/2007   Benign prostatic hyperplasia without urinary obstruction 04/22/2006   Osteoarthrosis 04/22/2006   Dyslipidemia 04/02/2006   PCP:  Sharlene Dory, DO Pharmacy:   [facility] [place], [place] - [address] [facility] Portal to Registered [facility] Sites [address] [place] [address] Phone: [phone] Fax: [phone]  [id number] - [place], [place] - [address] [place] [address] Phone: [phone] Fax: [phone]  [facility] [address] [place] [address] Phone: [phone] Fax: [phone]     Social Determinants of Health (SDOH) Social History: SDOH Screenings   Food Insecurity: No Food Insecurity (06/26/2022)  Housing: Patient Declined (06/26/2022)  Transportation Needs: No Transportation Needs (06/26/2022)  Utilities: Not At Risk (06/26/2022)  Alcohol Screen: Low Risk  (07/27/2021)  Depression (PHQ2-9): Low Risk  (05/29/2022)  Financial Resource Strain: Low Risk  (07/27/2021)  Physical Activity: Sufficiently Active (07/27/2021)  Social Connections: Socially Integrated (07/27/2021)  Stress: No Stress Concern Present (07/27/2021)  Tobacco Use: Medium Risk (06/27/2022)   SDOH Interventions:     Readmission Risk Interventions     No data to display

## 2022-06-27 NOTE — Plan of Care (Signed)
  Problem: Education: Goal: Understanding of cardiac disease, CV risk reduction, and recovery process will improve Outcome: Progressing Goal: Individualized Educational Video(s) Outcome: Progressing   Problem: Activity: Goal: Ability to tolerate increased activity will improve Outcome: Progressing   Problem: Cardiac: Goal: Ability to achieve and maintain adequate cardiovascular perfusion will improve Outcome: Progressing   Problem: Health Behavior/Discharge Planning: Goal: Ability to safely manage health-related needs after discharge will improve Outcome: Progressing   Problem: Education: Goal: Understanding of CV disease, CV risk reduction, and recovery process will improve Outcome: Progressing Goal: Individualized Educational Video(s) Outcome: Progressing   Problem: Activity: Goal: Ability to return to baseline activity level will improve Outcome: Progressing   Problem: Cardiovascular: Goal: Ability to achieve and maintain adequate cardiovascular perfusion will improve Outcome: Progressing Goal: Vascular access site(s) Level 0-1 will be maintained Outcome: Progressing   Problem: Health Behavior/Discharge Planning: Goal: Ability to safely manage health-related needs after discharge will improve Outcome: Progressing   Problem: Education: Goal: Knowledge of General Education information will improve Description: Including pain rating scale, medication(s)/side effects and non-pharmacologic comfort measures Outcome: Progressing   Problem: Health Behavior/Discharge Planning: Goal: Ability to manage health-related needs will improve Outcome: Progressing   Problem: Clinical Measurements: Goal: Ability to maintain clinical measurements within normal limits will improve Outcome: Progressing Goal: Will remain free from infection Outcome: Progressing Goal: Diagnostic test results will improve Outcome: Progressing Goal: Respiratory complications will improve Outcome:  Progressing Goal: Cardiovascular complication will be avoided Outcome: Progressing   Problem: Activity: Goal: Risk for activity intolerance will decrease Outcome: Progressing   Problem: Nutrition: Goal: Adequate nutrition will be maintained Outcome: Progressing   Problem: Coping: Goal: Level of anxiety will decrease Outcome: Progressing   Problem: Elimination: Goal: Will not experience complications related to bowel motility Outcome: Progressing Goal: Will not experience complications related to urinary retention Outcome: Progressing   Problem: Pain Managment: Goal: General experience of comfort will improve Outcome: Progressing   Problem: Safety: Goal: Ability to remain free from injury will improve Outcome: Progressing   Problem: Skin Integrity: Goal: Risk for impaired skin integrity will decrease Outcome: Progressing   

## 2022-06-27 NOTE — Evaluation (Signed)
Physical Therapy Evaluation Patient Details Name: Raymond Christensen MRN: [id number] DOB: [birth year] Today's Date: 06/27/2022  History of Present Illness  Pt is 82 year old presented to [facility] on  06/25/22 for chest pain. Pt with NSTEMI and had cardiac cath. Significant CAD with plan for medical treatment. PMH - CHF, mitra clip, DM, CEA, CABG, afib, arthritis, THR  Clinical Impression  Pt doing well with mobility and no further PT needed.  Ready for dc from PT standpoint. Pt is very close or at his baseline. Gave home ex program and pt is going to start cardiac rehab outpt at dc.         Recommendations for follow up therapy are one component of a multi-disciplinary discharge planning process, led by the attending physician.  Recommendations may be updated based on patient status, additional functional criteria and insurance authorization.  Follow Up Recommendations       Assistance Recommended at Discharge PRN  Patient can return home with the following       Equipment Recommendations None recommended by PT  Recommendations for Other Services       Functional Status Assessment Patient has not had a recent decline in their functional status     Precautions / Restrictions Precautions Precautions: Fall      Mobility  Bed Mobility Overal bed mobility: Modified Independent                  Transfers Overall transfer level: Modified independent Equipment used: None                    Ambulation/Gait Ambulation/Gait assistance: Modified independent (Device/Increase time) Gait Distance (Feet): 300 Feet Assistive device: None Gait Pattern/deviations: Step-through pattern, Decreased stride length Gait velocity: adequate Gait velocity interpretation: >2.62 ft/sec, indicative of community ambulatory   General Gait Details: Steady gait  Stairs            Wheelchair Mobility    Modified Rankin (Stroke Patients Only)       Balance Overall balance  assessment: Needs assistance Sitting-balance support: No upper extremity supported, Feet supported Sitting balance-Leahy Scale: Normal     Standing balance support: No upper extremity supported, During functional activity Standing balance-Leahy Scale: Good               High level balance activites: Other (comment) High Level Balance Comments: Reaching and weight shifting             Pertinent Vitals/Pain Pain Assessment Pain Assessment: No/denies pain    Home Living Family/patient expects to be discharged to:: Private residence Living Arrangements: Spouse/significant other Available Help at Discharge: Family;Available 24 hours/day Type of Home: House Home Access: Level entry       Home Layout: One level        Prior Function Prior Level of Function : Independent/Modified Independent;History of Falls (last six months)             Mobility Comments: Not using assistive device       Hand Dominance        Extremity/Trunk Assessment   Upper Extremity Assessment Upper Extremity Assessment: Overall WFL for tasks assessed    Lower Extremity Assessment Lower Extremity Assessment: Overall WFL for tasks assessed       Communication   Communication: No difficulties  Cognition Arousal/Alertness: Awake/alert Behavior During Therapy: WFL for tasks assessed/performed Overall Cognitive Status: Within Functional Limits for tasks assessed  General Comments General comments (skin integrity, edema, etc.): VSS on RA    Exercises Other Exercises Other Exercises: Repeated sit to stand x 5 without use of UE's Other Exercises: Instructed in standing and reaching activities standing by countertop at home   Assessment/Plan    PT Assessment Patient does not need any further PT services  PT Problem List         PT Treatment Interventions      PT Goals (Current goals can be found in the Care Plan  section)  Acute Rehab PT Goals PT Goal Formulation: All assessment and education complete, DC therapy    Frequency       Co-evaluation               AM-PAC PT "6 Clicks" Mobility  Outcome Measure Help needed turning from your back to your side while in a flat bed without using bedrails?: None Help needed moving from lying on your back to sitting on the side of a flat bed without using bedrails?: None Help needed moving to and from a bed to a chair (including a wheelchair)?: None Help needed standing up from a chair using your arms (e.g., wheelchair or bedside chair)?: None Help needed to walk in hospital room?: None Help needed climbing 3-5 steps with a railing? : None 6 Click Score: 24    End of Session   Activity Tolerance: Patient tolerated treatment well Patient left: in bed;with call bell/phone within reach;with family/visitor present Nurse Communication: Mobility status PT Visit Diagnosis: History of falling (Z91.81)    Time: 1610-9604 PT Time Calculation (min) (ACUTE ONLY): 15 min   Charges:   PT Evaluation $PT Eval Low Complexity: 1 Low          [facility] PT Acute Rehabilitation Services Office [phone]   Angelina Ok [facility] 06/27/2022, 12:07 PM

## 2022-06-28 ENCOUNTER — Telehealth: Payer: Self-pay | Admitting: Internal Medicine

## 2022-06-28 ENCOUNTER — Telehealth: Payer: Self-pay

## 2022-06-28 NOTE — Telephone Encounter (Signed)
New Message:     Patient was told to call hi blood pressure reading in to the office.   06-28-22-   84/42, 87/49  Pt c/o BP issue: STAT if pt c/o blurred vision, one-sided weakness or slurred speech  1. What are your last 5 BP readings? 84/42, 87,49  84/42, 87,49  2. Are you having any other symptoms (ex. Dizziness, headache, blurred vision, passed out)?  Very tired  3. What is your BP issue?  Low blood pressure

## 2022-06-28 NOTE — Telephone Encounter (Signed)
error 

## 2022-06-28 NOTE — Telephone Encounter (Signed)
Took isosorbide 15, Entresto 24-26 mg, and Toprol XL 12.5 mg  BP readings NOON are 80s/40s.  He is going to increase fluids and eat a small salty snack right now since he is feeling fatigued and "whoozie".  I asked him to check bp before meds in am, If BP >110 take entresto and IMDUR, if around 100 or less, just take Entresto and about 2 hours later take IMDUR.  At bedtime, take Entresto and Toprol XL.  I asked him to call back on Monday w how he is feeling.  Pt in agreement with this plan.

## 2022-06-28 NOTE — Transitions of Care (Post Inpatient/ED Visit) (Signed)
06/28/2022  Name: Raymond Christensen MRN: [id number] DOB: 05-16-1940  Today's TOC FU Call Status: Today's TOC FU Call Status:: Successful TOC FU Call Competed TOC FU Call Complete Date: 06/28/22  Transition Care Management Follow-up Telephone Call Date of Discharge: 06/27/22 [facility] Type of Discharge: Inpatient Admission Primary Inpatient Discharge Diagnosis:: heart disease How have you been since you were released from the hospital?: Better Any questions or concerns?: No  Items Reviewed: Did you receive and understand the discharge instructions provided?: Yes Medications obtained,verified, and reconciled?: Yes (Medications Reviewed) Any new allergies since your discharge?: No Dietary orders reviewed?: Yes Do you have support at home?: Yes People in Home: spouse  Medications Reviewed Today: Medications Reviewed Today     Reviewed by Karena Addison, LPN (Licensed Practical Nurse) on 06/28/22 at 0908  Med List Status: <None>   Medication Order Taking? Sig Documenting Provider Last Dose Status Informant  acetaminophen (TYLENOL) 500 MG tablet [id number] Yes Take 1,000 mg by mouth every 6 (six) hours as needed for moderate pain or headache. [provider] Taking Active Self  atorvastatin (LIPITOR) 80 MG tablet [id number] Yes TAKE 1 TABLET DAILY  Patient taking differently: Take 80 mg by mouth daily.   Lyn Records, MD Taking Active Self  Cholecalciferol (VITAMIN D3) 250 MCG (10000 UT) capsule [id number] Yes Take 10,000 Units by mouth daily.  [provider] Taking Active Self  ELIQUIS 5 MG TABS tablet [id number] Yes TAKE 1 TABLET TWICE A DAY Lyn Records, MD Taking Active Self  ENTRESTO 24-26 MG [id number] Yes TAKE 1 TABLET TWICE A DAY  Patient taking differently: Take 1 tablet by mouth 2 (two) times daily.   Lyn Records, MD Taking Active Self  ezetimibe (ZETIA) 10 MG tablet [id number] Yes Take 1 tablet (10 mg total) by mouth daily.  Christell Constant, MD Taking Active Self  famotidine (PEPCID) 20 MG tablet [id number] Yes Take 1 tablet (20 mg total) by mouth daily. Saguier, Ramon Dredge, PA-C Taking Active Self  isosorbide mononitrate (IMDUR) 30 MG 24 hr tablet [id number] Yes Take 0.5 tablets (15 mg total) by mouth daily. Corrin Parker, PA-C Taking Active   levothyroxine (SYNTHROID) 75 MCG tablet [id number] Yes Take 1 tablet (75 mcg total) by mouth daily. Sharlene Dory, DO Taking Active Self  liothyronine (CYTOMEL) 5 MCG tablet [id number] Yes TAKE 2 TABLETS DAILY  Patient taking differently: Take 10 mcg by mouth daily.   Sharlene Dory, DO Taking Active Self  metoprolol succinate (TOPROL-XL) 25 MG 24 hr tablet [id number] Yes Take 0.5 tablets (12.5 mg total) by mouth daily. Corrin Parker, PA-C Taking Active   Multiple Vitamins-Minerals (MULTIVITAMIN ADULT EXTRA C PO) [id number] Yes Take 1 tablet by mouth daily.  [provider] Taking Active Self  nitroGLYCERIN (NITROSTAT) 0.4 MG SL tablet [id number] Yes Place 1 tablet (0.4 mg total) under the tongue every 5 (five) minutes as needed for chest pain. Sharlene Dory, DO Taking Active Self  Semaglutide, 2 MG/DOSE, 8 MG/3ML SOPN [id number] Yes Inject 2 mg as directed once a week.  Patient taking differently: Inject 2 mg as directed once a week. Sunday   Sharlene Dory, DO Taking Active Self  sertraline (ZOLOFT) 100 MG tablet [id number] Yes Take 0.5 tablets (50 mg total) by mouth daily.  Patient taking differently: Take 100 mg by mouth daily.   Sharlene Dory, DO Taking Active Self            Home Care  and Equipment/Supplies: Were Home Health Services Ordered?: NA Any new equipment or medical supplies ordered?: NA  Functional Questionnaire: Do you need assistance with bathing/showering or dressing?: No Do you need assistance with meal preparation?: No Do you need assistance with eating?: No Do you have difficulty  maintaining continence: No Do you need assistance with getting out of bed/getting out of a chair/moving?: No Do you have difficulty managing or taking your medications?: No  Follow up appointments reviewed: PCP Follow-up appointment confirmed?: NA Specialist Hospital Follow-up appointment confirmed?: Yes Date of Specialist follow-up appointment?: 07/04/22 Follow-Up Specialty Provider:: Cardio Do you need transportation to your follow-up appointment?: No Do you understand care options if your condition(s) worsen?: Yes-patient verbalized understanding    SIGNATURE Karena Addison, LPN [facility] Nurse Health Advisor Direct Dial [phone]

## 2022-07-01 ENCOUNTER — Other Ambulatory Visit (HOSPITAL_COMMUNITY): Payer: Self-pay

## 2022-07-01 NOTE — Telephone Encounter (Signed)
Called pt to get an update on BP readings.  Reports BP and symptoms are better since changing times of medication.  BP now 90-100/50-70.    Advised this is still a little low.  Denies dizziness and lightheadedness.  Has increased fluid intake.   Advised will send this information to MD if changes are needed will call pt back.

## 2022-07-03 ENCOUNTER — Telehealth: Payer: Self-pay | Admitting: Family Medicine

## 2022-07-03 ENCOUNTER — Ambulatory Visit (HOSPITAL_COMMUNITY): Payer: Medicare (Managed Care)

## 2022-07-03 NOTE — Telephone Encounter (Signed)
Caller/Agency: [facility] ([facility])  Callback Number: [phone] (SECURE)  Requesting OT/PT/Skilled Nursing/Social Work/Speech Therapy: PT (after hospital admission) Frequency: 2x for 1 week 1x 1 week.

## 2022-07-03 NOTE — Progress Notes (Unsigned)
Cardiology Office Note:  .   Date:  07/04/2022  ID:  Raymond Christensen, DOB 1940/08/25, MRN [id number] PCP: Sharlene Dory, DO  [facility] Providers Cardiologist:  Christell Constant, MD Electrophysiologist:  Regan Lemming, MD {  History of Present Illness: .   Raymond Christensen is a 82 y.o. male with a past medical history of atrial fibrillation, elevated troponin and new reduced EF 35 to 40% 08/2019, moderate to severe MR, LV thrombus on TEE, recent PCI and stent with SVG to circumflex here for follow-up appointment.  History includes MI back in 1994 status post cardiac arrest with lytics and PTCI, CABG 2001 (LIMA to LAD, SVG to OM 2, SVG to right PDA, SVG to D2) with postop A-fib, carotid stenosis status post L CEA and right subclavian stenosis, diet-controlled DM, hypertension, obesity, OSA on CPAP, mild cognitive impairment, and depression/mood disorder.  History of MitraClip 12/2019, history of AF ablation 01/2020.  Recent new onset bleeding on Eliquis/Plavix with Plavix DC 08/2020.  Patient was seen 05/31/2022 and was doing well at that time other than some fatigue.  He lives a rather sedentary life.  Wife notes that he naps more.  No interval hospital/ED visits at that appointment. On zoloft 50mg  daily.  No chest pain/pressure.  No DOE/SOB.  Today, he had chest pain on 6/11 and was in the ER. Troponin was 1,478. Medical management was suggested. Should be on ASA per patient. His BP is very low today and I asked him to hold [facility] for a few days.  It did come up to 100/60.  He did not eat or drink much this morning.  Asked to track his blood pressure an hour after morning medications.  Will be due for an echocardiogram in September.  Reports no shortness of breath nor dyspnea on exertion. Reports no chest pain, pressure, or tightness. No edema, orthopnea, PND. Reports no palpitations.   ROS: Refer to HPI for pertinent ROS  Studies Reviewed: .       Cardiac  catheterization 06/26/2022 Left Main  Mid LM to Dist LM lesion is 70% stenosed.    Left Anterior Descending  Mid LAD lesion is 75% stenosed.    First Diagonal Branch  1st Diag lesion is 40% stenosed.    Second Diagonal Branch  2nd Diag lesion is 75% stenosed.    Left Circumflex    First Obtuse Marginal Branch  1st Mrg lesion is 75% stenosed.    Second Obtuse Marginal Branch  2nd Mrg-1 lesion is 80% stenosed.  2nd Mrg-2 lesion is 100% stenosed.    Right Coronary Artery  Prox RCA to Mid RCA lesion is 90% stenosed.  Mid RCA to Dist RCA lesion is 100% stenosed.    LIMA Graft To Dist LAD    Sequential Graft To 2nd Mrg, RPDA  Prox Graft to Mid Graft lesion before 2nd Mrg is 95% stenosed.  Dist Graft to Insertion lesion before 2nd Mrg is 85% stenosed. The lesion was previously treated .  Prox Graft to Mid Graft lesion between 2nd Mrg and RPDA is 100% stenosed. The lesion was previously treated .  Dist Graft to Insertion lesion between 2nd Mrg and RPDA is 100% stenosed.    Intervention   No interventions have been documented.   Left Heart  Left Ventricle There is moderate left ventricular systolic dysfunction. LV end diastolic pressure is mildly elevated. The left ventricular ejection fraction is 35-45% by visual estimate. There are LV function abnormalities. LVEDP 17 mmHg  Coronary Diagrams  Diagnostic Dominance: Right  Intervention      Physical Exam:   VS:  BP (!) 87/56   Pulse 79   Ht 5\' 7"  (1.702 m)   Wt 207 lb 12.8 oz (94.3 kg)   SpO2 98%   BMI 32.55 kg/m    Wt Readings from Last 3 Encounters:  07/04/22 207 lb 12.8 oz (94.3 kg)  06/27/22 207 lb 3.2 oz (94 kg)  05/31/22 218 lb (98.9 kg)    GEN: Well nourished, well developed in no acute distress NECK: No JVD; No carotid bruits CARDIAC: RRR, no murmurs, rubs, gallops RESPIRATORY:  Clear to auscultation without rales, wheezing or rhonchi  ABDOMEN: Soft, non-tender, non-distended EXTREMITIES:  No edema;  No deformity   ASSESSMENT AND PLAN: .   1.  NSTEMI/CAD status post CABG and PCI -No chest pain -Continue current medications including Eliquis 5 mg twice a day, Lipitor 80 mg daily, Zetia 10 mg daily, Imdur 15 mg daily, metoprolol succinate 12.5 mg daily, nitro as needed, Entresto 24-26 mg twice a day (medication on hold for now for hypotension) -Start back on ASA -He can be cleared for cardiac rehab since his blood pressure came up to 100/60 -If he is feeling okay he can drive in a week  2.  Chronic HFrEF -no edema, euvolemic on exam, may be a little dehydrated -Holding Entresto at the moment for hypotension -Will need follow-up echocardiogram in a few months to reassess EF -Continue heart healthy, low-sodium diet  3.  Paroxysmal atrial fibrillation status post ablation/frequent PACs/PVCs -no issues -he does not feel them (PVC) -No further workup at this time needed  4.  Mitral regurgitation status post MitraClip 12/2019 -no dizziness, no SOB -rare lightheadedness -Most recent echocardiogram reviewed with the patient today, plans for follow-up echocardiogram in September  5.  Hypertension -BP is usually higher (hypotension today) -Will hold Entresto for a few days and restart on Monday if blood pressure is stable -Maintain proper hydration, 64 ounces daily as well as proper oral intake -Patient has been on Ozempic and lost around 40 pounds over the last couple of months.  6.  Hyperlipidemia -LDL 34, at goal -Continue current medication regimen  7.  Type 2 diabetes mellitus -A1C 5.4  8.  Hypothyroidism -TSH 2.0    Cardiac Rehabilitation Eligibility Assessment  The patient is ready to start cardiac rehabilitation from a cardiac standpoint.      Dispo: He can follow-up with myself or Dr. Izora Ribas in 3 months  Signed, Sharlene Dory, PA-C

## 2022-07-03 NOTE — Telephone Encounter (Signed)
Called HH informed of verbal ok per PCP. 

## 2022-07-04 ENCOUNTER — Telehealth (HOSPITAL_COMMUNITY): Payer: Self-pay

## 2022-07-04 ENCOUNTER — Ambulatory Visit: Payer: Medicare (Managed Care) | Attending: Physician Assistant | Admitting: Physician Assistant

## 2022-07-04 ENCOUNTER — Institutional Professional Consult (permissible substitution) (HOSPITAL_BASED_OUTPATIENT_CLINIC_OR_DEPARTMENT_OTHER): Payer: Medicare (Managed Care) | Admitting: Pulmonary Disease

## 2022-07-04 ENCOUNTER — Other Ambulatory Visit: Payer: Self-pay | Admitting: Family Medicine

## 2022-07-04 ENCOUNTER — Encounter: Payer: Self-pay | Admitting: Physician Assistant

## 2022-07-04 VITALS — BP 87/56 | HR 79 | Ht 67.0 in | Wt 207.8 lb

## 2022-07-04 DIAGNOSIS — Z95818 Presence of other cardiac implants and grafts: Secondary | ICD-10-CM | POA: Diagnosis not present

## 2022-07-04 DIAGNOSIS — I340 Nonrheumatic mitral (valve) insufficiency: Secondary | ICD-10-CM

## 2022-07-04 DIAGNOSIS — I771 Stricture of artery: Secondary | ICD-10-CM

## 2022-07-04 DIAGNOSIS — E039 Hypothyroidism, unspecified: Secondary | ICD-10-CM

## 2022-07-04 DIAGNOSIS — I2510 Atherosclerotic heart disease of native coronary artery without angina pectoris: Secondary | ICD-10-CM

## 2022-07-04 DIAGNOSIS — I6522 Occlusion and stenosis of left carotid artery: Secondary | ICD-10-CM

## 2022-07-04 DIAGNOSIS — Z9889 Other specified postprocedural states: Secondary | ICD-10-CM

## 2022-07-04 DIAGNOSIS — G4733 Obstructive sleep apnea (adult) (pediatric): Secondary | ICD-10-CM | POA: Diagnosis not present

## 2022-07-04 DIAGNOSIS — I5022 Chronic systolic (congestive) heart failure: Secondary | ICD-10-CM

## 2022-07-04 DIAGNOSIS — I255 Ischemic cardiomyopathy: Secondary | ICD-10-CM

## 2022-07-04 MED ORDER — METOPROLOL SUCCINATE ER 25 MG PO TB24: 12.5000 mg | ORAL_TABLET | Freq: Every day | ORAL | 3 refills | Status: DC

## 2022-07-04 MED ORDER — ISOSORBIDE MONONITRATE ER 30 MG PO TB24: 15.0000 mg | ORAL_TABLET | Freq: Every day | ORAL | 3 refills | Status: DC

## 2022-07-04 NOTE — Telephone Encounter (Signed)
Called patient to see if he is interested in the Cardiac Rehab Program. Patient expressed interest. Explained scheduling process, patient verbalized understanding. Will contact patient for scheduling once f/u has been completed.  

## 2022-07-04 NOTE — Telephone Encounter (Signed)
Pt seen in office on 07/04/22  Jari Favre, PA please see encounter.

## 2022-07-04 NOTE — Patient Instructions (Signed)
Medication Instructions:  Hold entresto for the next few days, restart it on Monday 07/08/22 *If you need a refill on your cardiac medications before your next appointment, please call your pharmacy*   Lab Work: None ordered If you have labs (blood work) drawn today and your tests are completely normal, you will receive your results only by: MyChart Message (if you have MyChart) OR A paper copy in the mail If you have any lab test that is abnormal or we need to change your treatment, we will call you to review the results.   Testing/Procedures: Your physician has requested that you have an echocardiogram in 3 months. Echocardiography is a painless test that uses sound waves to create images of your heart. It provides your doctor with information about the size and shape of your heart and how well your heart's chambers and valves are working. This procedure takes approximately one hour. There are no restrictions for this procedure. Please do NOT wear cologne, perfume, aftershave, or lotions (deodorant is allowed). Please arrive 15 minutes prior to your appointment time.    Follow-Up: At [facility], you and your health needs are our priority.  As part of our continuing mission to provide you with exceptional heart care, we have created designated Provider Care Teams.  These Care Teams include your primary Cardiologist (physician) and Advanced Practice Providers (APPs -  Physician Assistants and Nurse Practitioners) who all work together to provide you with the care you need, when you need it.   Your next appointment:   3 month(s)  Provider:   Christell Constant, MD  or Jari Favre, PA-C     Then, Christell Constant, MD will plan to see you again in 6 month(s).   Other Instructions Check your blood pressure daily, 1 hr after morning medications for 2 weeks, keep a log and send [place] the readings through mychart at the end of the 2 weeks. Weigh every morning after using the  restroom, before breakfast and call and let us know if you have a weight gain of 2 lbs or more overnight or 5 lbs or more in a week.   Low-Sodium Eating Plan Salt (sodium) helps you keep a healthy balance of fluids in your body. Too much sodium can raise your blood pressure. It can also cause fluid and waste to be held in your body. Your health care provider or dietitian may recommend a low-sodium eating plan if you have high blood pressure (hypertension), kidney disease, liver disease, or heart failure. Eating less sodium can help lower your blood pressure and reduce swelling. It can also protect your heart, liver, and kidneys. What are tips for following this plan? Reading food labels  Check food labels for the amount of sodium per serving. If you eat more than one serving, you must multiply the listed amount by the number of servings. Choose foods with less than 140 milligrams (mg) of sodium per serving. Avoid foods with 300 mg of sodium or more per serving. Always check how much sodium is in a product, even if the label says "unsalted" or "no salt added." Shopping  Buy products labeled as "low-sodium" or "no salt added." Buy fresh foods. Avoid canned foods and pre-made or frozen meals. Avoid canned, cured, or processed meats. Buy breads that have less than 80 mg of sodium per slice. Cooking  Eat more home-cooked food. Try to eat less restaurant, buffet, and fast food. Try not to add salt when you cook. Use salt-free  seasonings or herbs instead of table salt or sea salt. Check with your provider or pharmacist before using salt substitutes. Cook with plant-based oils, such as canola, sunflower, or olive oil. Meal planning When eating at a restaurant, ask if your food can be made with less salt or no salt. Avoid dishes labeled as brined, pickled, cured, or smoked. Avoid dishes made with soy sauce, miso, or teriyaki sauce. Avoid foods that have monosodium glutamate (MSG) in them. MSG may be  added to some restaurant food, sauces, soups, bouillon, and canned foods. Make meals that can be grilled, baked, poached, roasted, or steamed. These are often made with less sodium. General information Try to limit your sodium intake to 1,500-2,300 mg each day, or the amount told by your provider. What foods should I eat? Fruits Fresh, frozen, or canned fruit. Fruit juice. Vegetables Fresh or frozen vegetables. "No salt added" canned vegetables. "No salt added" tomato sauce and paste. Low-sodium or reduced-sodium tomato and vegetable juice. Grains Low-sodium cereals, such as oats, puffed wheat and rice, and shredded wheat. Low-sodium crackers. Unsalted rice. Unsalted pasta. Low-sodium bread. Whole grain breads and whole grain pasta. Meats and other proteins Fresh or frozen meat, poultry, seafood, and fish. These should have no added salt. Low-sodium canned tuna and salmon. Unsalted nuts. Dried peas, beans, and lentils without added salt. Unsalted canned beans. Eggs. Unsalted nut butters. Dairy Milk. Soy milk. Cheese that is naturally low in sodium, such as ricotta cheese, fresh mozzarella, or Swiss cheese. Low-sodium or reduced-sodium cheese. Cream cheese. Yogurt. Seasonings and condiments Fresh and dried herbs and spices. Salt-free seasonings. Low-sodium mustard and ketchup. Sodium-free salad dressing. Sodium-free light mayonnaise. Fresh or refrigerated horseradish. Lemon juice. Vinegar. Other foods Homemade, reduced-sodium, or low-sodium soups. Unsalted popcorn and pretzels. Low-salt or salt-free chips. The items listed above may not be all the foods and drinks you can have. Talk to a dietitian to learn more. What foods should I avoid? Vegetables Sauerkraut, pickled vegetables, and relishes. Olives. [place] fries. Onion rings. Regular canned vegetables, except low-sodium or reduced-sodium items. Regular canned tomato sauce and paste. Regular tomato and vegetable juice. Frozen vegetables in  sauces. Grains Instant hot cereals. Bread stuffing, pancake, and biscuit mixes. Croutons. Seasoned rice or pasta mixes. Noodle soup cups. Boxed or frozen macaroni and cheese. Regular salted crackers. Self-rising flour. Meats and other proteins Meat or fish that is salted, canned, smoked, spiced, or pickled. Precooked or cured meat, such as sausages or meat loaves. Tomasa Blase. Ham. Pepperoni. Hot dogs. Corned beef. Chipped beef. Salt pork. Jerky. Pickled herring, anchovies, and sardines. Regular canned tuna. Salted nuts. Dairy Processed cheese and cheese spreads. Hard cheeses. Cheese curds. Blue cheese. Feta cheese. String cheese. Regular cottage cheese. Buttermilk. Canned milk. Fats and oils Salted butter. Regular margarine. Ghee. Bacon fat. Seasonings and condiments Onion salt, garlic salt, seasoned salt, table salt, and sea salt. Canned and packaged gravies. Worcestershire sauce. Tartar sauce. Barbecue sauce. Teriyaki sauce. Soy sauce, including reduced-sodium soy sauce. Steak sauce. Fish sauce. Oyster sauce. Cocktail sauce. Horseradish that you find on the shelf. Regular ketchup and mustard. Meat flavorings and tenderizers. Bouillon cubes. Hot sauce. Pre-made or packaged marinades. Pre-made or packaged taco seasonings. Relishes. Regular salad dressings. Salsa. Other foods Salted popcorn and pretzels. Corn chips and puffs. Potato and tortilla chips. Canned or dried soups. Pizza. Frozen entrees and pot pies. The items listed above may not be all the foods and drinks you should avoid. Talk to a dietitian to learn more. This information is  not intended to replace advice given to you by your health care provider. Make sure you discuss any questions you have with your health care provider. Document Revised: 01/17/2022 Document Reviewed: 01/17/2022 Elsevier Patient Education  2024 Elsevier Inc.   Heart-Healthy Eating Plan Many factors influence your heart health, including eating and exercise habits.  Heart health is also called coronary health. Coronary risk increases with abnormal blood fat (lipid) levels. A heart-healthy eating plan includes limiting unhealthy fats, increasing healthy fats, limiting salt (sodium) intake, and making other diet and lifestyle changes. What is my plan? Your health care provider may recommend that: You limit your fat intake to _________% or less of your total calories each day. You limit your saturated fat intake to _________% or less of your total calories each day. You limit the amount of cholesterol in your diet to less than _________ mg per day. You limit the amount of sodium in your diet to less than _________ mg per day. What are tips for following this plan? Cooking Cook foods using methods other than frying. Baking, boiling, grilling, and broiling are all good options. Other ways to reduce fat include: Removing the skin from poultry. Removing all visible fats from meats. Steaming vegetables in water or broth. Meal planning  At meals, imagine dividing your plate into fourths: Fill one-half of your plate with vegetables and green salads. Fill one-fourth of your plate with whole grains. Fill one-fourth of your plate with lean protein foods. Eat 2-4 cups of vegetables per day. One cup of vegetables equals 1 cup (91 g) broccoli or cauliflower florets, 2 medium carrots, 1 large bell pepper, 1 large sweet potato, 1 large tomato, 1 medium white potato, 2 cups (150 g) raw leafy greens. Eat 1-2 cups of fruit per day. One cup of fruit equals 1 small apple, 1 large banana, 1 cup (237 g) mixed fruit, 1 large orange,  cup (82 g) dried fruit, 1 cup (240 mL) 100% fruit juice. Eat more foods that contain soluble fiber. Examples include apples, broccoli, carrots, beans, peas, and barley. Aim to get 25-30 g of fiber per day. Increase your consumption of legumes, nuts, and seeds to 4-5 servings per week. One serving of dried beans or legumes equals  cup (90 g)  cooked, 1 serving of nuts is  oz (12 almonds, 24 pistachios, or 7 walnut halves), and 1 serving of seeds equals  oz (8 g). Fats Choose healthy fats more often. Choose monounsaturated and polyunsaturated fats, such as olive and canola oils, avocado oil, flaxseeds, walnuts, almonds, and seeds. Eat more omega-3 fats. Choose salmon, mackerel, sardines, tuna, flaxseed oil, and ground flaxseeds. Aim to eat fish at least 2 times each week. Check food labels carefully to identify foods with trans fats or high amounts of saturated fat. Limit saturated fats. These are found in animal products, such as meats, butter, and cream. Plant sources of saturated fats include palm oil, palm kernel oil, and coconut oil. Avoid foods with partially hydrogenated oils in them. These contain trans fats. Examples are stick margarine, some tub margarines, cookies, crackers, and other baked goods. Avoid fried foods. General information Eat more home-cooked food and less restaurant, buffet, and fast food. Limit or avoid alcohol. Limit foods that are high in added sugar and simple starches such as foods made using white refined flour (white breads, pastries, sweets). Lose weight if you are overweight. Losing just 5-10% of your body weight can help your overall health and prevent diseases such as diabetes  and heart disease. Monitor your sodium intake, especially if you have high blood pressure. Talk with your health care provider about your sodium intake. Try to incorporate more vegetarian meals weekly. What foods should I eat? Fruits All fresh, canned (in natural juice), or frozen fruits. Vegetables Fresh or frozen vegetables (raw, steamed, roasted, or grilled). Green salads. Grains Most grains. Choose whole wheat and whole grains most of the time. Rice and pasta, including brown rice and pastas made with whole wheat. Meats and other proteins Lean, well-trimmed beef, veal, pork, and lamb. Chicken and [place] without skin.  All fish and shellfish. Wild duck, rabbit, pheasant, and venison. Egg whites or low-cholesterol egg substitutes. Dried beans, peas, lentils, and tofu. Seeds and most nuts. Dairy Low-fat or nonfat cheeses, including ricotta and mozzarella. Skim or 1% milk (liquid, powdered, or evaporated). Buttermilk made with low-fat milk. Nonfat or low-fat yogurt. Fats and oils Non-hydrogenated (trans-free) margarines. Vegetable oils, including soybean, sesame, sunflower, olive, avocado, peanut, safflower, corn, canola, and cottonseed. Salad dressings or mayonnaise made with a vegetable oil. Beverages Water (mineral or sparkling). Coffee and tea. Unsweetened ice tea. Diet beverages. Sweets and desserts Sherbet, gelatin, and fruit ice. Small amounts of dark chocolate. Limit all sweets and desserts. Seasonings and condiments All seasonings and condiments. The items listed above may not be a complete list of foods and beverages you can eat. Contact a dietitian for more options. What foods should I avoid? Fruits Canned fruit in heavy syrup. Fruit in cream or butter sauce. Fried fruit. Limit coconut. Vegetables Vegetables cooked in cheese, cream, or butter sauce. Fried vegetables. Grains Breads made with saturated or trans fats, oils, or whole milk. Croissants. Sweet rolls. Donuts. High-fat crackers, such as cheese crackers and chips. Meats and other proteins Fatty meats, such as hot dogs, ribs, sausage, bacon, rib-eye roast or steak. High-fat deli meats, such as salami and bologna. Caviar. Domestic duck and goose. Organ meats, such as liver. Dairy Cream, sour cream, cream cheese, and creamed cottage cheese. Whole-milk cheeses. Whole or 2% milk (liquid, evaporated, or condensed). Whole buttermilk. Cream sauce or high-fat cheese sauce. Whole-milk yogurt. Fats and oils Meat fat, or shortening. Cocoa butter, hydrogenated oils, palm oil, coconut oil, palm kernel oil. Solid fats and shortenings, including bacon fat,  salt pork, lard, and butter. Nondairy cream substitutes. Salad dressings with cheese or sour cream. Beverages Regular sodas and any drinks with added sugar. Sweets and desserts Frosting. Pudding. Cookies. Cakes. Pies. Milk chocolate or white chocolate. Buttered syrups. Full-fat ice cream or ice cream drinks. The items listed above may not be a complete list of foods and beverages to avoid. Contact a dietitian for more information. Summary Heart-healthy meal planning includes limiting unhealthy fats, increasing healthy fats, limiting salt (sodium) intake and making other diet and lifestyle changes. Lose weight if you are overweight. Losing just 5-10% of your body weight can help your overall health and prevent diseases such as diabetes and heart disease. Focus on eating a balance of foods, including fruits and vegetables, low-fat or nonfat dairy, lean protein, nuts and legumes, whole grains, and heart-healthy oils and fats. This information is not intended to replace advice given to you by your health care provider. Make sure you discuss any questions you have with your health care provider. Document Revised: 02/05/2021 Document Reviewed: 02/05/2021 Elsevier Patient Education  2024 [employer].

## 2022-07-04 NOTE — Progress Notes (Signed)
Spoke with patient and scheduled him for nurse visit on 07/16/22 at 2:00 PM.

## 2022-07-04 NOTE — Progress Notes (Signed)
Spoke with patient and scheduled him for 07/16/22 at 2:00 PM.

## 2022-07-05 ENCOUNTER — Telehealth (HOSPITAL_COMMUNITY): Payer: Self-pay

## 2022-07-05 NOTE — Telephone Encounter (Signed)
Pt insurance is active and benefits verified through [employer]. Co-pay $15.00, DED $0.00/$0.00 met, out of pocket $4,000.00/$493.38 met, co-insurance 0%. No pre-authorization required. Jen/[employer], 07/05/22 @ 12:11PM, REF#[id number]   How many CR sessions are covered? (36 visits for TCR, 72 visits for ICR)72 Is this a lifetime maximum or an annual maximum? Annual Has the member used any of these services to date? No Is there a time limit (weeks/months) on start of program and/or program completion? No     Will contact patient to see if he is interested in the Cardiac Rehab Program.

## 2022-07-05 NOTE — Telephone Encounter (Signed)
Called patient to see if he was interested in participating in the Cardiac Rehab Program. Patient stated yes. Patient will come in for orientation on 07/09/22 @ 9:30AM and will attend the 11:45AM exercise class.   Pensions consultant.

## 2022-07-08 ENCOUNTER — Telehealth (HOSPITAL_COMMUNITY): Payer: Self-pay

## 2022-07-08 ENCOUNTER — Encounter: Payer: Self-pay | Admitting: Internal Medicine

## 2022-07-08 NOTE — Telephone Encounter (Signed)
Reviewed with patient the Cardiac Rehab Cardiac Risk [place] Nursing Assessment. Patient knows office location, and to wear comfortable clothing/closed-toed shoes. Recommended to patient to eat, take medications, and if diabetic, to check blood sugar before coming to classes. Explained orientation may take 2 hours.  

## 2022-07-09 ENCOUNTER — Encounter (HOSPITAL_COMMUNITY)
Admission: RE | Admit: 2022-07-09 | Discharge: 2022-07-09 | Disposition: A | Discharge status: Home / Self Care | Payer: Medicare (Managed Care) | Source: Ambulatory Visit | Attending: Internal Medicine | Admitting: Internal Medicine

## 2022-07-09 ENCOUNTER — Telehealth (HOSPITAL_COMMUNITY): Payer: Self-pay | Admitting: *Deleted

## 2022-07-09 ENCOUNTER — Encounter: Payer: Self-pay | Admitting: Family Medicine

## 2022-07-09 VITALS — BP 77/48 | HR 72 | Ht 67.0 in | Wt 212.1 lb

## 2022-07-09 DIAGNOSIS — I214 Non-ST elevation (NSTEMI) myocardial infarction: Secondary | ICD-10-CM | POA: Insufficient documentation

## 2022-07-09 LAB — GLUCOSE, CAPILLARY: Glucose-Capillary: 97 mg/dL (ref 70–99)

## 2022-07-09 NOTE — Progress Notes (Signed)
Patient here for cardiac rehab orientation. BP 77/48. Patient asymptomatic. Given water. Heart rate 77. Telemetry rhythm. Medications reviewed.Mr Simon's held his entresto for a few days then restarted per Jari Favre PAC. Patient was given 8 ounces of water. Recheck blood pressure 100/61 sitting. Standing BP 88/53. Spoke with onsite provider Edd Fabian NP and Helayne [place] PAC. Patient instructed to hold Imdur and entresto has an appointment for a nurse BP check on 07/16/22.  Per Doreen Beam NP. Ok, looks like he may be orthostatic. He may continue to hold his entresto and lets have him follow up in the office. Continue to maintain p.o. hydration. Change positions slowly. As long as he is asymptomatic he may participate in cardiac rehab.  Per Jari Favre PACagree with continuing to hold [place]. Please have him hold his Imdur as well. He was hypotensive in the office as well. thanks!  Patient aware of medication changes and know to hold off on exercise until appointment is made for follow up in the office. Patient and wife state understanding. Kendrell did admit to not drinking any water this morning prior to coming to cardiac rehab. Raeford did say he had a piece of toast and some coffee.Thayer Headings RN BSN

## 2022-07-09 NOTE — Progress Notes (Signed)
Cardiac Individual Treatment Plan  Patient Details  Name: Raymond Christensen MRN: [id number] Date of Birth: 21-May-1940 Referring Provider:   Flowsheet Row INTENSIVE CARDIAC REHAB ORIENT from 07/09/2022 in [facility]  Referring Provider [name], MD       Initial Encounter Date:  Flowsheet Row INTENSIVE CARDIAC REHAB ORIENT from 07/09/2022 in [facility] for Heart, Vascular, & Lung Health  Date 07/09/22       Visit Diagnosis: 06/25/22 NSTEMI (non-ST elevated myocardial infarction) [facility]  Patient's Home Medications on Admission:  Current Outpatient Medications:    acetaminophen (TYLENOL) 500 MG tablet, Take 1,000 mg by mouth every 6 (six) hours as needed for moderate pain or headache., Disp: , Rfl:    atorvastatin (LIPITOR) 80 MG tablet, TAKE 1 TABLET DAILY, Disp: 90 tablet, Rfl: 2   Cholecalciferol (VITAMIN D3) 250 MCG (10000 UT) capsule, Take 10,000 Units by mouth daily. , Disp: , Rfl:    ELIQUIS 5 MG TABS tablet, TAKE 1 TABLET TWICE A DAY, Disp: 180 tablet, Rfl: 3   ENTRESTO 24-26 MG, TAKE 1 TABLET TWICE A DAY, Disp: 180 tablet, Rfl: 3   ezetimibe (ZETIA) 10 MG tablet, Take 1 tablet (10 mg total) by mouth daily., Disp: 90 tablet, Rfl: 0   famotidine (PEPCID) 20 MG tablet, Take 1 tablet (20 mg total) by mouth daily., Disp: 30 tablet, Rfl: 3   isosorbide mononitrate (IMDUR) 30 MG 24 hr tablet, Take 0.5 tablets (15 mg total) by mouth daily., Disp: 45 tablet, Rfl: 3   levothyroxine (SYNTHROID) 75 MCG tablet, TAKE 1 TABLET DAILY, Disp: 90 tablet, Rfl: 1   liothyronine (CYTOMEL) 5 MCG tablet, TAKE 2 TABLETS DAILY (Patient taking differently: Take 10 mcg by mouth daily.), Disp: 180 tablet, Rfl: 3   metoprolol succinate (TOPROL-XL) 25 MG 24 hr tablet, Take 0.5 tablets (12.5 mg total) by mouth daily., Disp: 45 tablet, Rfl: 3   Multiple Vitamins-Minerals (MULTIVITAMIN ADULT EXTRA C PO), Take 1 tablet by  mouth daily. , Disp: , Rfl:    nitroGLYCERIN (NITROSTAT) 0.4 MG SL tablet, Place 1 tablet (0.4 mg total) under the tongue every 5 (five) minutes as needed for chest pain., Disp: 30 tablet, Rfl: 12   Semaglutide, 2 MG/DOSE, 8 MG/3ML SOPN, Inject 2 mg as directed once a week. (Patient taking differently: Inject 2 mg as directed once a week. Sunday), Disp: 9 mL, Rfl: 2   sertraline (ZOLOFT) 100 MG tablet, Take 0.5 tablets (50 mg total) by mouth daily. (Patient taking differently: Take 100 mg by mouth daily.), Disp: 90 tablet, Rfl: 0   tamsulosin (FLOMAX) 0.4 MG CAPS capsule, Take 0.4 mg by mouth daily after supper., Disp: , Rfl:   Past Medical History: Past Medical History:  Diagnosis Date   Arthritis    CAD (coronary artery disease)    Carotid artery disease (HCC)    s/p left CEA 01/17/14   Chronic systolic CHF (congestive heart failure) (HCC)    Concussion Summer 2012   Essential hypertension 02/26/2017   Heart disease    Hyperlipidemia    Hypothyroidism 10/15/2017   Major depressive disorder    Mitral valve regurgitation    Myocardial infarction (HCC) 1996, 2021   Obstructive sleep apnea on CPAP    Persistent atrial fibrillation (HCC)    Poor flexibility of tendon 12/01/2018   S/P mitral valve clip implantation 12/23/2019   s/p TEER with MitraClip with one XTW by Dr. Excell Seltzer  Subungual hematoma of digit of hand 12/01/2018   Subungual hematoma of right foot 12/01/2018   Thyroid disease    Type 2 diabetes mellitus without complication, without long-term current use of insulin (HCC) 02/26/2017    Tobacco Use: Social History   Tobacco Use  Smoking Status Former   Types: Cigarettes   Quit date: 1980   Years since quitting: 44.5  Smokeless Tobacco Never    Labs: Review Flowsheet  More data exists      Latest Ref Rng & Units 08/31/2021 09/30/2021 10/08/2021 05/03/2022 06/26/2022  Labs for ITP Cardiac and Pulmonary Rehab  Cholestrol 0 - 200 mg/dL 409  - 811  - 85   LDL (calc) 0 - 99  mg/dL - - 45  - 34   Direct LDL mg/dL 91.4  - - - -  HDL-C >78 mg/dL 29.56  - 21.30  - 40   Trlycerides <150 mg/dL 865.7  - 846.9  - 53   Hemoglobin A1c 4.8 - 5.6 % 6.4  - - 5.8  5.4   TCO2 22 - 32 mmol/L - 26  - - -    Capillary Blood Glucose: Lab Results  Component Value Date   GLUCAP 97 07/09/2022   GLUCAP 106 (H) 06/27/2022   GLUCAP 99 06/27/2022   GLUCAP 132 (H) 06/26/2022   GLUCAP 94 06/26/2022     Exercise Target Goals: Exercise Program Goal: Individual exercise prescription set using results from initial 6 min walk test and THRR while considering  patient's activity barriers and safety.   Exercise Prescription Goal: Initial exercise prescription builds to 30-45 minutes a day of aerobic activity, 2-3 days per week.  Home exercise guidelines will be given to patient during program as part of exercise prescription that the participant will acknowledge.  Activity Barriers & Risk Stratification:  Activity Barriers & Cardiac Risk Stratification - 07/09/22 1251       Activity Barriers & Cardiac Risk Stratification   Activity Barriers Balance Concerns;History of Falls;Decreased Ventricular Function;Left Hip Replacement;Right Hip Replacement;Deconditioning;Muscular Weakness    Cardiac Risk Stratification High             6 Minute Walk:  6 Minute Walk     Row Name 07/09/22 1057         6 Minute Walk   Phase Initial  Nustep used due to low blood pressure     Distance 1837 feet  Nustep used due to low BP     Walk Time 6 minutes     # of Rest Breaks 0     MPH 3.5     METS 2.9     RPE 11     Perceived Dyspnea  0     VO2 Peak 10.1     Symptoms No     Resting HR 72 bpm     Resting BP 104/61     Resting Oxygen Saturation  97 %     Exercise Oxygen Saturation  during 6 min walk 97 %     Max Ex. HR 105 bpm     Max Ex. BP 114/56     2 Minute Post BP 97/56              Oxygen Initial Assessment:   Oxygen Re-Evaluation:   Oxygen Discharge (Final Oxygen  Re-Evaluation):   Initial Exercise Prescription:  Initial Exercise Prescription - 07/09/22 1300       Date of Initial Exercise RX and Referring Provider   Date 07/09/22  Referring Provider [name], MD    Expected Discharge Date 09/20/22      NuStep   Level 1    SPM 80    Minutes 25    METs 2.9      Prescription Details   Frequency (times per week) 3    Duration Progress to 30 minutes of continuous aerobic without signs/symptoms of physical distress      Intensity   THRR 40-80% of Max Heartrate 56-111    Ratings of Perceived Exertion 11-13    Perceived Dyspnea 0-4      Progression   Progression Continue to progress workloads to maintain intensity without signs/symptoms of physical distress.      Resistance Training   Training Prescription Yes    Weight 3 lbs    Reps 10-15             Perform Capillary Blood Glucose checks as needed.  Exercise Prescription Changes:   Exercise Comments:   Exercise Goals and Review:   Exercise Goals     Row Name 07/09/22 1252             Exercise Goals   Increase Physical Activity Yes       Intervention Provide advice, education, support and counseling about physical activity/exercise needs.;Develop an individualized exercise prescription for aerobic and resistive training based on initial evaluation findings, risk stratification, comorbidities and participant's personal goals.       Expected Outcomes Short Term: Attend rehab on a regular basis to increase amount of physical activity.;Long Term: Add in home exercise to make exercise part of routine and to increase amount of physical activity.;Long Term: Exercising regularly at least 3-5 days a week.       Increase Strength and Stamina Yes       Intervention Provide advice, education, support and counseling about physical activity/exercise needs.;Develop an individualized exercise prescription for aerobic and resistive training based on initial evaluation  findings, risk stratification, comorbidities and participant's personal goals.       Expected Outcomes Short Term: Increase workloads from initial exercise prescription for resistance, speed, and METs.;Short Term: Perform resistance training exercises routinely during rehab and add in resistance training at home;Long Term: Improve cardiorespiratory fitness, muscular endurance and strength as measured by increased METs and functional capacity ( )       Able to understand and use rate of perceived exertion (RPE) scale Yes       Intervention Provide education and explanation on how to use RPE scale       Expected Outcomes Short Term: Able to use RPE daily in rehab to express subjective intensity level;Long Term:  Able to use RPE to guide intensity level when exercising independently       Knowledge and understanding of Target Heart Rate Range (THRR) Yes       Intervention Provide education and explanation of THRR including how the numbers were predicted and where they are located for reference       Expected Outcomes Short Term: Able to state/look up THRR;Short Term: Able to use daily as guideline for intensity in rehab;Long Term: Able to use THRR to govern intensity when exercising independently       Understanding of Exercise Prescription Yes       Intervention Provide education, explanation, and written materials on patient's individual exercise prescription       Expected Outcomes Short Term: Able to explain program exercise prescription;Long Term: Able to explain home exercise prescription to exercise independently  Exercise Goals Re-Evaluation :   Discharge Exercise Prescription (Final Exercise Prescription Changes):   Nutrition:  Target Goals: Understanding of nutrition guidelines, daily intake of sodium 1500mg , cholesterol 200mg , calories 30% from fat and 7% or less from saturated fats, daily to have 5 or more servings of fruits and vegetables.  Biometrics:  Pre  Biometrics - 07/09/22 1253       Pre Biometrics   Waist Circumference 44.5 inches    Hip Circumference 47 inches    Waist to Hip Ratio 0.95 %    Triceps Skinfold 14 mm    % Body Fat 32 %    Grip Strength 31 kg    Flexibility 0 in   Cannot reach   Single Leg Stand 1 seconds              Nutrition Therapy Plan and Nutrition Goals:   Nutrition Assessments:  MEDIFICTS Score Key: ?70 Need to make dietary changes  40-70 Heart Healthy Diet ? 40 Therapeutic Level Cholesterol Diet   Flowsheet Row CARDIAC REHAB PHASE II EXERCISE from 06/23/2020 in [facility]  Picture Your Plate Total Score on Discharge 64      Picture Your Plate Scores: <40 Unhealthy dietary pattern with much room for improvement. 41-50 Dietary pattern unlikely to meet recommendations for good health and room for improvement. 51-60 More healthful dietary pattern, with some room for improvement.  >60 Healthy dietary pattern, although there may be some specific behaviors that could be improved.    Nutrition Goals Re-Evaluation:   Nutrition Goals Re-Evaluation:   Nutrition Goals Discharge (Final Nutrition Goals Re-Evaluation):   Psychosocial: Target Goals: Acknowledge presence or absence of significant depression and/or stress, maximize coping skills, provide positive support system. Participant is able to verbalize types and ability to use techniques and skills needed for reducing stress and depression.  Initial Review & Psychosocial Screening:  Initial Psych Review & Screening - 07/09/22 1008       Initial Review   Current issues with None Identified;History of Depression      Family Dynamics   Good Support System? Yes   Has spouse and younger son for support   Comments Dontre denies being depressed. Asher takes Zoloft. Pt feels that his medication is working well for him.      Barriers   Psychosocial barriers to participate in  program There are no identifiable barriers or psychosocial needs.      Screening Interventions   Interventions Encouraged to exercise    Expected Outcomes Long Term Goal: Stressors or current issues are controlled or eliminated.             Quality of Life Scores:  Quality of Life - 07/09/22 1243       Quality of Life   Select Quality of Life      Quality of Life Scores   Health/Function Pre 19.23 %    Socioeconomic Pre 27.5 %    Psych/Spiritual Pre 22.86 %    Family Pre 27.6 %    GLOBAL Pre 22.91 %            Scores of 19 and below usually indicate a poorer quality of life in these areas.  A difference of  2-3 points is a clinically meaningful difference.  A difference of 2-3 points in the total score of the Quality of Life Index has been associated with significant improvement in overall quality of life, self-image, physical symptoms, and  general health in studies assessing change in quality of life.  PHQ-9: Review Flowsheet  More data exists      07/09/2022 05/29/2022 02/11/2022 07/27/2021 03/02/2021  Depression screen PHQ 2/9  Decreased Interest 0 0 0 0 1  Down, Depressed, Hopeless 0 0 2 1 1   PHQ - 2 Score 0 0 2 1 2   Altered sleeping 0 0 2 - -  Tired, decreased energy 2 0 3 - -  Change in appetite 1 0 1 - -  Feeling bad or failure about yourself  1 0 0 - -  Trouble concentrating 0 0 0 - -  Moving slowly or fidgety/restless 0 0 0 - -  Suicidal thoughts 0 0 0 - -  PHQ-9 Score 4 0 8 - -  Difficult doing work/chores Not difficult at all Not difficult at all Not difficult at all - -   Interpretation of Total Score  Total Score Depression Severity:  1-4 = Minimal depression, 5-9 = Mild depression, 10-14 = Moderate depression, 15-19 = Moderately severe depression, 20-27 = Severe depression   Psychosocial Evaluation and Intervention:   Psychosocial Re-Evaluation:   Psychosocial Discharge (Final Psychosocial Re-Evaluation):   Vocational Rehabilitation: Provide  vocational rehab assistance to qualifying candidates.   Vocational Rehab Evaluation & Intervention:  Vocational Rehab - 07/09/22 1010       Initial Vocational Rehab Evaluation & Intervention   Assessment shows need for Vocational Rehabilitation No   Pt is retired.            Education: Education Goals: Education classes will be provided on a weekly basis, covering required topics. Participant will state understanding/return demonstration of topics presented.     Core Videos: Exercise    Move It!  Clinical staff conducted group or individual video education with verbal and written material and guidebook.  Patient learns the recommended Pritikin exercise program. Exercise with the goal of living a long, healthy life. Some of the health benefits of exercise include controlled diabetes, healthier blood pressure levels, improved cholesterol levels, improved heart and lung capacity, improved sleep, and better body composition. Everyone should speak with their doctor before starting or changing an exercise routine.  Biomechanical Limitations Clinical staff conducted group or individual video education with verbal and written material and guidebook.  Patient learns how biomechanical limitations can impact exercise and how we can mitigate and possibly overcome limitations to have an impactful and balanced exercise routine.  Body Composition Clinical staff conducted group or individual video education with verbal and written material and guidebook.  Patient learns that body composition (ratio of muscle mass to fat mass) is a key component to assessing overall fitness, rather than body weight alone. Increased fat mass, especially visceral belly fat, can put [place] at increased risk for metabolic syndrome, type 2 diabetes, heart disease, and even death. It is recommended to combine diet and exercise (cardiovascular and resistance training) to improve your body composition. Seek guidance from your  physician and exercise physiologist before implementing an exercise routine.  Exercise Action Plan Clinical staff conducted group or individual video education with verbal and written material and guidebook.  Patient learns the recommended strategies to achieve and enjoy long-term exercise adherence, including variety, self-motivation, self-efficacy, and positive decision making. Benefits of exercise include fitness, good health, weight management, more energy, better sleep, less stress, and overall well-being.  Medical   Heart Disease Risk Reduction Clinical staff conducted group or individual video education with verbal and written material and guidebook.  Patient learns  our heart is our most vital organ as it circulates oxygen, nutrients, white blood cells, and hormones throughout the entire body, and carries waste away. Data supports a plant-based eating plan like the Pritikin Program for its effectiveness in slowing progression of and reversing heart disease. The video provides a number of recommendations to address heart disease.   Metabolic Syndrome and Belly Fat  Clinical staff conducted group or individual video education with verbal and written material and guidebook.  Patient learns what metabolic syndrome is, how it leads to heart disease, and how one can reverse it and keep it from coming back. You have metabolic syndrome if you have 3 of the following 5 criteria: abdominal obesity, high blood pressure, high triglycerides, low HDL cholesterol, and high blood sugar.  Hypertension and Heart Disease Clinical staff conducted group or individual video education with verbal and written material and guidebook.  Patient learns that high blood pressure, or hypertension, is very common in the [place]. Hypertension is largely due to excessive salt intake, but other important risk factors include being overweight, physical inactivity, drinking too much alcohol, smoking, and not eating enough  potassium from fruits and vegetables. High blood pressure is a leading risk factor for heart attack, stroke, congestive heart failure, dementia, kidney failure, and premature death. Long-term effects of excessive salt intake include stiffening of the arteries and thickening of heart muscle and organ damage. Recommendations include ways to reduce hypertension and the risk of heart disease.  Diseases of Our Time - Focusing on Diabetes Clinical staff conducted group or individual video education with verbal and written material and guidebook.  Patient learns why the best way to stop diseases of our time is prevention, through food and other lifestyle changes. Medicine (such as prescription pills and surgeries) is often only a Band-Aid on the problem, not a long-term solution. Most common diseases of our time include obesity, type 2 diabetes, hypertension, heart disease, and cancer. The Pritikin Program is recommended and has been proven to help reduce, reverse, and/or prevent the damaging effects of metabolic syndrome.  Nutrition   Overview of the Pritikin Eating Plan  Clinical staff conducted group or individual video education with verbal and written material and guidebook.  Patient learns about the Pritikin Eating Plan for disease risk reduction. The Pritikin Eating Plan emphasizes a wide variety of unrefined, minimally-processed carbohydrates, like fruits, vegetables, whole grains, and legumes. Go, Caution, and Stop food choices are explained. Plant-based and lean animal proteins are emphasized. Rationale provided for low sodium intake for blood pressure control, low added sugars for blood sugar stabilization, and low added fats and oils for coronary artery disease risk reduction and weight management.  Calorie Density  Clinical staff conducted group or individual video education with verbal and written material and guidebook.  Patient learns about calorie density and how it impacts the Pritikin Eating  Plan. Knowing the characteristics of the food you choose will help you decide whether those foods will lead to weight gain or weight loss, and whether you want to consume more or less of them. Weight loss is usually a side effect of the Pritikin Eating Plan because of its focus on low calorie-dense foods.  Label Reading  Clinical staff conducted group or individual video education with verbal and written material and guidebook.  Patient learns about the Pritikin recommended label reading guidelines and corresponding recommendations regarding calorie density, added sugars, sodium content, and whole grains.  Dining Out - Part 1  Clinical staff conducted group or  individual video education with verbal and written material and guidebook.  Patient learns that restaurant meals can be sabotaging because they can be so high in calories, fat, sodium, and/or sugar. Patient learns recommended strategies on how to positively address this and avoid unhealthy pitfalls.  Facts on Fats  Clinical staff conducted group or individual video education with verbal and written material and guidebook.  Patient learns that lifestyle modifications can be just as effective, if not more so, as many medications for lowering your risk of heart disease. A Pritikin lifestyle can help to reduce your risk of inflammation and atherosclerosis (cholesterol build-up, or plaque, in the artery walls). Lifestyle interventions such as dietary choices and physical activity address the cause of atherosclerosis. A review of the types of fats and their impact on blood cholesterol levels, along with dietary recommendations to reduce fat intake is also included.  Nutrition Action Plan  Clinical staff conducted group or individual video education with verbal and written material and guidebook.  Patient learns how to incorporate Pritikin recommendations into their lifestyle. Recommendations include planning and keeping personal health goals in mind  as an important part of their success.  Healthy Mind-Set    Healthy Minds, Bodies, Hearts  Clinical staff conducted group or individual video education with verbal and written material and guidebook.  Patient learns how to identify when they are stressed. Video will discuss the impact of that stress, as well as the many benefits of stress management. Patient will also be introduced to stress management techniques. The way we think, act, and feel has an impact on our hearts.  How Our Thoughts Can Heal Our Hearts  Clinical staff conducted group or individual video education with verbal and written material and guidebook.  Patient learns that negative thoughts can cause depression and anxiety. This can result in negative lifestyle behavior and serious health problems. Cognitive behavioral therapy is an effective method to help control our thoughts in order to change and improve our emotional outlook.  Additional Videos:  Exercise    Improving Performance  Clinical staff conducted group or individual video education with verbal and written material and guidebook.  Patient learns to use a non-linear approach by alternating intensity levels and lengths of time spent exercising to help burn more calories and lose more body fat. Cardiovascular exercise helps improve heart health, metabolism, hormonal balance, blood sugar control, and recovery from fatigue. Resistance training improves strength, endurance, balance, coordination, reaction time, metabolism, and muscle mass. Flexibility exercise improves circulation, posture, and balance. Seek guidance from your physician and exercise physiologist before implementing an exercise routine and learn your capabilities and proper form for all exercise.  Introduction to Yoga  Clinical staff conducted group or individual video education with verbal and written material and guidebook.  Patient learns about yoga, a discipline of the coming together of mind, breath,  and body. The benefits of yoga include improved flexibility, improved range of motion, better posture and core strength, increased lung function, weight loss, and positive self-image. Yoga's heart health benefits include lowered blood pressure, healthier heart rate, decreased cholesterol and triglyceride levels, improved immune function, and reduced stress. Seek guidance from your physician and exercise physiologist before implementing an exercise routine and learn your capabilities and proper form for all exercise.  Medical   Aging: Enhancing Your Quality of Life  Clinical staff conducted group or individual video education with verbal and written material and guidebook.  Patient learns key strategies and recommendations to stay in good physical health and  enhance quality of life, such as prevention strategies, having an advocate, securing a Health Care Proxy and Power of Attorney, and keeping a list of medications and system for tracking them. It also discusses how to avoid risk for bone loss.  Biology of Weight Control  Clinical staff conducted group or individual video education with verbal and written material and guidebook.  Patient learns that weight gain occurs because we consume more calories than we burn (eating more, moving less). Even if your body weight is normal, you may have higher ratios of fat compared to muscle mass. Too much body fat puts you at increased risk for cardiovascular disease, heart attack, stroke, type 2 diabetes, and obesity-related cancers. In addition to exercise, following the Pritikin Eating Plan can help reduce your risk.  Decoding Lab Results  Clinical staff conducted group or individual video education with verbal and written material and guidebook.  Patient learns that lab test reflects one measurement whose values change over time and are influenced by many factors, including medication, stress, sleep, exercise, food, hydration, pre-existing medical conditions,  and more. It is recommended to use the knowledge from this video to become more involved with your lab results and evaluate your numbers to speak with your doctor.   Diseases of Our Time - Overview  Clinical staff conducted group or individual video education with verbal and written material and guidebook.  Patient learns that according to the CDC, 50% to 70% of chronic diseases (such as obesity, type 2 diabetes, elevated lipids, hypertension, and heart disease) are avoidable through lifestyle improvements including healthier food choices, listening to satiety cues, and increased physical activity.  Sleep Disorders Clinical staff conducted group or individual video education with verbal and written material and guidebook.  Patient learns how good quality and duration of sleep are important to overall health and well-being. Patient also learns about sleep disorders and how they impact health along with recommendations to address them, including discussing with a physician.  Nutrition  Dining Out - Part 2 Clinical staff conducted group or individual video education with verbal and written material and guidebook.  Patient learns how to plan ahead and communicate in order to maximize their dining experience in a healthy and nutritious manner. Included are recommended food choices based on the type of restaurant the patient is visiting.   Fueling a Banker conducted group or individual video education with verbal and written material and guidebook.  There is a strong connection between our food choices and our health. Diseases like obesity and type 2 diabetes are very prevalent and are in large-part due to lifestyle choices. The Pritikin Eating Plan provides plenty of food and hunger-curbing satisfaction. It is easy to follow, affordable, and helps reduce health risks.  Menu Workshop  Clinical staff conducted group or individual video education with verbal and written material  and guidebook.  Patient learns that restaurant meals can sabotage health goals because they are often packed with calories, fat, sodium, and sugar. Recommendations include strategies to plan ahead and to communicate with the manager, chef, or server to help order a healthier meal.  Planning Your Eating Strategy  Clinical staff conducted group or individual video education with verbal and written material and guidebook.  Patient learns about the Pritikin Eating Plan and its benefit of reducing the risk of disease. The Pritikin Eating Plan does not focus on calories. Instead, it emphasizes high-quality, nutrient-rich foods. By knowing the characteristics of the foods, we choose, we can determine  their calorie density and make informed decisions.  Targeting Your Nutrition Priorities  Clinical staff conducted group or individual video education with verbal and written material and guidebook.  Patient learns that lifestyle habits have a tremendous impact on disease risk and progression. This video provides eating and physical activity recommendations based on your personal health goals, such as reducing LDL cholesterol, losing weight, preventing or controlling type 2 diabetes, and reducing high blood pressure.  Vitamins and Minerals  Clinical staff conducted group or individual video education with verbal and written material and guidebook.  Patient learns different ways to obtain key vitamins and minerals, including through a recommended healthy diet. It is important to discuss all supplements you take with your doctor.   Healthy Mind-Set    Smoking Cessation  Clinical staff conducted group or individual video education with verbal and written material and guidebook.  Patient learns that cigarette smoking and tobacco addiction pose a serious health risk which affects millions of people. Stopping smoking will significantly reduce the risk of heart disease, lung disease, and many forms of cancer.  Recommended strategies for quitting are covered, including working with your doctor to develop a successful plan.  Culinary   Becoming a Set designer conducted group or individual video education with verbal and written material and guidebook.  Patient learns that cooking at home can be healthy, cost-effective, quick, and puts them in control. Keys to cooking healthy recipes will include looking at your recipe, assessing your equipment needs, planning ahead, making it simple, choosing cost-effective seasonal ingredients, and limiting the use of added fats, salts, and sugars.  Cooking - Breakfast and Snacks  Clinical staff conducted group or individual video education with verbal and written material and guidebook.  Patient learns how important breakfast is to satiety and nutrition through the entire day. Recommendations include key foods to eat during breakfast to help stabilize blood sugar levels and to prevent overeating at meals later in the day. Planning ahead is also a key component.  Cooking - Educational psychologist conducted group or individual video education with verbal and written material and guidebook.  Patient learns eating strategies to improve overall health, including an approach to cook more at home. Recommendations include thinking of animal protein as a side on your plate rather than center stage and focusing instead on lower calorie dense options like vegetables, fruits, whole grains, and plant-based proteins, such as beans. Making sauces in large quantities to freeze for later and leaving the skin on your vegetables are also recommended to maximize your experience.  Cooking - Healthy Salads and Dressing Clinical staff conducted group or individual video education with verbal and written material and guidebook.  Patient learns that vegetables, fruits, whole grains, and legumes are the foundations of the Pritikin Eating Plan. Recommendations include how  to incorporate each of these in flavorful and healthy salads, and how to create homemade salad dressings. Proper handling of ingredients is also covered. Cooking - Soups and [employer] - Soups and Desserts Clinical staff conducted group or individual video education with verbal and written material and guidebook.  Patient learns that Pritikin soups and desserts make for easy, nutritious, and delicious snacks and meal components that are low in sodium, fat, sugar, and calorie density, while high in vitamins, minerals, and filling fiber. Recommendations include simple and healthy ideas for soups and desserts.   Overview     The Pritikin Solution Program Overview Clinical staff conducted group or individual  video education with verbal and written material and guidebook.  Patient learns that the results of the Pritikin Program have been documented in more than 100 articles published in peer-reviewed journals, and the benefits include reducing risk factors for (and, in some cases, even reversing) high cholesterol, high blood pressure, type 2 diabetes, obesity, and more! An overview of the three key pillars of the Pritikin Program will be covered: eating well, doing regular exercise, and having a healthy mind-set.  WORKSHOPS  Exercise: Exercise Basics: Building Your Action Plan Clinical staff led group instruction and group discussion with PowerPoint presentation and patient guidebook. To enhance the learning environment the use of posters, models and videos may be added. At the conclusion of this workshop, patients will comprehend the difference between physical activity and exercise, as well as the benefits of incorporating both, into their routine. Patients will understand the FITT (Frequency, Intensity, Time, and Type) principle and how to use it to build an exercise action plan. In addition, safety concerns and other considerations for exercise and cardiac rehab will be addressed by the  presenter. The purpose of this lesson is to promote a comprehensive and effective weekly exercise routine in order to improve patients' overall level of fitness.   Managing Heart Disease: Your Path to a Healthier Heart Clinical staff led group instruction and group discussion with PowerPoint presentation and patient guidebook. To enhance the learning environment the use of posters, models and videos may be added.At the conclusion of this workshop, patients will understand the anatomy and physiology of the heart. Additionally, they will understand how Pritikin's three pillars impact the risk factors, the progression, and the management of heart disease.  The purpose of this lesson is to provide a high-level overview of the heart, heart disease, and how the Pritikin lifestyle positively impacts risk factors.  Exercise Biomechanics Clinical staff led group instruction and group discussion with PowerPoint presentation and patient guidebook. To enhance the learning environment the use of posters, models and videos may be added. Patients will learn how the structural parts of their bodies function and how these functions impact their daily activities, movement, and exercise. Patients will learn how to promote a neutral spine, learn how to manage pain, and identify ways to improve their physical movement in order to promote healthy living. The purpose of this lesson is to expose patients to common physical limitations that impact physical activity. Participants will learn practical ways to adapt and manage aches and pains, and to minimize their effect on regular exercise. Patients will learn how to maintain good posture while sitting, walking, and lifting.  Balance Training and Fall Prevention  Clinical staff led group instruction and group discussion with PowerPoint presentation and patient guidebook. To enhance the learning environment the use of posters, models and videos may be added. At the  conclusion of this workshop, patients will understand the importance of their sensorimotor skills (vision, proprioception, and the vestibular system) in maintaining their ability to balance as they age. Patients will apply a variety of balancing exercises that are appropriate for their current level of function. Patients will understand the common causes for poor balance, possible solutions to these problems, and ways to modify their physical environment in order to minimize their fall risk. The purpose of this lesson is to teach patients about the importance of maintaining balance as they age and ways to minimize their risk of falling.  WORKSHOPS   Nutrition:  Fueling a Ship broker led group instruction and group discussion  with PowerPoint presentation and patient guidebook. To enhance the learning environment the use of posters, models and videos may be added. Patients will review the foundational principles of the Pritikin Eating Plan and understand what constitutes a serving size in each of the food groups. Patients will also learn Pritikin-friendly foods that are better choices when away from home and review make-ahead meal and snack options. Calorie density will be reviewed and applied to three nutrition priorities: weight maintenance, weight loss, and weight gain. The purpose of this lesson is to reinforce (in a group setting) the key concepts around what patients are recommended to eat and how to apply these guidelines when away from home by planning and selecting Pritikin-friendly options. Patients will understand how calorie density may be adjusted for different weight management goals.  Mindful Eating  Clinical staff led group instruction and group discussion with PowerPoint presentation and patient guidebook. To enhance the learning environment the use of posters, models and videos may be added. Patients will briefly review the concepts of the Pritikin Eating Plan and the  importance of low-calorie dense foods. The concept of mindful eating will be introduced as well as the importance of paying attention to internal hunger signals. Triggers for non-hunger eating and techniques for dealing with triggers will be explored. The purpose of this lesson is to provide patients with the opportunity to review the basic principles of the Pritikin Eating Plan, discuss the value of eating mindfully and how to measure internal cues of hunger and fullness using the Hunger Scale. Patients will also discuss reasons for non-hunger eating and learn strategies to use for controlling emotional eating.  Targeting Your Nutrition Priorities Clinical staff led group instruction and group discussion with PowerPoint presentation and patient guidebook. To enhance the learning environment the use of posters, models and videos may be added. Patients will learn how to determine their genetic susceptibility to disease by reviewing their family history. Patients will gain insight into the importance of diet as part of an overall healthy lifestyle in mitigating the impact of genetics and other environmental insults. The purpose of this lesson is to provide patients with the opportunity to assess their personal nutrition priorities by looking at their family history, their own health history and current risk factors. Patients will also be able to discuss ways of prioritizing and modifying the Pritikin Eating Plan for their highest risk areas  Menu  Clinical staff led group instruction and group discussion with PowerPoint presentation and patient guidebook. To enhance the learning environment the use of posters, models and videos may be added. Using menus brought in from [employer], or printed from [employer], patients will apply the Pritikin dining out guidelines that were presented in the [employer] video. Patients will also be able to practice these guidelines in a variety of  provided scenarios. The purpose of this lesson is to provide patients with the opportunity to practice hands-on learning of the Pritikin Dining Out guidelines with actual menus and practice scenarios.  Label Reading Clinical staff led group instruction and group discussion with PowerPoint presentation and patient guidebook. To enhance the learning environment the use of posters, models and videos may be added. Patients will review and discuss the Pritikin label reading guidelines presented in Pritikin's Label Reading Educational series video. Using fool labels brought in from local grocery stores and markets, patients will apply the label reading guidelines and determine if the packaged food meet the Pritikin guidelines. The purpose of this lesson is to  provide patients with the opportunity to review, discuss, and practice hands-on learning of the Pritikin Label Reading guidelines with actual packaged food labels. Cooking School  Pritikin's [employer] are designed to teach patients ways to prepare quick, simple, and affordable recipes at home. The importance of nutrition's role in chronic disease risk reduction is reflected in its emphasis in the overall Pritikin program. By learning how to prepare essential core Pritikin Eating Plan recipes, patients will increase control over what they eat; be able to customize the flavor of foods without the use of added salt, sugar, or fat; and improve the quality of the food they consume. By learning a set of core recipes which are easily assembled, quickly prepared, and affordable, patients are more likely to prepare more healthy foods at home. These workshops focus on convenient breakfasts, simple entres, side dishes, and desserts which can be prepared with minimal effort and are consistent with nutrition recommendations for cardiovascular risk reduction. Cooking [employer] are taught by a Armed forces logistics/support/administrative officer (RD) who has been trained by the  [employer]. The chef or RD has a clear understanding of the importance of minimizing - if not completely eliminating - added fat, sugar, and sodium in recipes. Throughout the series of Cooking School Workshop sessions, patients will learn about healthy ingredients and efficient methods of cooking to build confidence in their capability to prepare    Cooking School weekly topics:  Adding Flavor- Sodium-Free  Fast and Healthy Breakfasts  Powerhouse Plant-Based Proteins  Satisfying Salads and Dressings  Simple Sides and Sauces  International Cuisine-Spotlight on the [employer] Zones  Delicious Desserts  Savory Soups  [employer] - Meals in a Astronomer Appetizers and Snacks  Comforting Weekend Breakfasts  One-Pot Wonders   Fast Evening Meals  Landscape architect Your Pritikin Plate  WORKSHOPS   Healthy Mindset (Psychosocial):  Focused Goals, Sustainable Changes Clinical staff led group instruction and group discussion with PowerPoint presentation and patient guidebook. To enhance the learning environment the use of posters, models and videos may be added. Patients will be able to apply effective goal setting strategies to establish at least one personal goal, and then take consistent, meaningful action toward that goal. They will learn to identify common barriers to achieving personal goals and develop strategies to overcome them. Patients will also gain an understanding of how our mind-set can impact our ability to achieve goals and the importance of cultivating a positive and growth-oriented mind-set. The purpose of this lesson is to provide patients with a deeper understanding of how to set and achieve personal goals, as well as the tools and strategies needed to overcome common obstacles which may arise along the way.  From Head to Heart: The Power of a Healthy Outlook  Clinical staff led group instruction and group discussion with PowerPoint presentation  and patient guidebook. To enhance the learning environment the use of posters, models and videos may be added. Patients will be able to recognize and describe the impact of emotions and mood on physical health. They will discover the importance of self-care and explore self-care practices which may work for them. Patients will also learn how to utilize the 4 C's to cultivate a healthier outlook and better manage stress and challenges. The purpose of this lesson is to demonstrate to patients how a healthy outlook is an essential part of maintaining good health, especially as they continue their cardiac rehab journey.  Healthy Sleep for a Healthy Heart  Clinical staff led group instruction and group discussion with PowerPoint presentation and patient guidebook. To enhance the learning environment the use of posters, models and videos may be added. At the conclusion of this workshop, patients will be able to demonstrate knowledge of the importance of sleep to overall health, well-being, and quality of life. They will understand the symptoms of, and treatments for, common sleep disorders. Patients will also be able to identify daytime and nighttime behaviors which impact sleep, and they will be able to apply these tools to help manage sleep-related challenges. The purpose of this lesson is to provide patients with a general overview of sleep and outline the importance of quality sleep. Patients will learn about a few of the most common sleep disorders. Patients will also be introduced to the concept of "sleep hygiene," and discover ways to self-manage certain sleeping problems through simple daily behavior changes. Finally, the workshop will motivate patients by clarifying the links between quality sleep and their goals of heart-healthy living.   Recognizing and Reducing Stress Clinical staff led group instruction and group discussion with PowerPoint presentation and patient guidebook. To enhance the learning  environment the use of posters, models and videos may be added. At the conclusion of this workshop, patients will be able to understand the types of stress reactions, differentiate between acute and chronic stress, and recognize the impact that chronic stress has on their health. They will also be able to apply different coping mechanisms, such as reframing negative self-talk. Patients will have the opportunity to practice a variety of stress management techniques, such as deep abdominal breathing, progressive muscle relaxation, and/or guided imagery.  The purpose of this lesson is to educate patients on the role of stress in their lives and to provide healthy techniques for coping with it.  Learning Barriers/Preferences:  Learning Barriers/Preferences - 07/09/22 1246       Learning Barriers/Preferences   Learning Barriers Sight;Hearing   wears glasses and hearing aids   Learning Preferences Written Material;Computer/Internet             Education Topics:  Knowledge Questionnaire Score:  Knowledge Questionnaire Score - 07/09/22 1244       Knowledge Questionnaire Score   Pre Score 22/24             Core Components/Risk Factors/Patient Goals at Admission:  Personal Goals and Risk Factors at Admission - 07/09/22 1244       Core Components/Risk Factors/Patient Goals on Admission   Diabetes Yes    Intervention Provide education about signs/symptoms and action to take for hypo/hyperglycemia.;Provide education about proper nutrition, including hydration, and aerobic/resistive exercise prescription along with prescribed medications to achieve blood glucose in normal ranges: Fasting glucose 65-99 mg/dL    Expected Outcomes Short Term: Participant verbalizes understanding of the signs/symptoms and immediate care of hyper/hypoglycemia, proper foot care and importance of medication, aerobic/resistive exercise and nutrition plan for blood glucose control.;Long Term: Attainment of HbA1C < 7%.     Heart Failure Yes    Intervention Provide a combined exercise and nutrition program that is supplemented with education, support and counseling about heart failure. Directed toward relieving symptoms such as shortness of breath, decreased exercise tolerance, and extremity edema.    Expected Outcomes Improve functional capacity of life;Short term: Attendance in program 2-3 days a week with increased exercise capacity. Reported lower sodium intake. Reported increased fruit and vegetable intake. Reports medication compliance.;Short term: Daily weights obtained and reported for increase. Utilizing diuretic protocols set by physician.;Long term:  Adoption of self-care skills and reduction of barriers for early signs and symptoms recognition and intervention leading to self-care maintenance.    Hypertension Yes    Intervention Monitor prescription use compliance.;Provide education on lifestyle modifcations including regular physical activity/exercise, weight management, moderate sodium restriction and increased consumption of fresh fruit, vegetables, and low fat dairy, alcohol moderation, and smoking cessation.    Expected Outcomes Short Term: Continued assessment and intervention until BP is < 140/88mm HG in hypertensive participants. < 130/69mm HG in hypertensive participants with diabetes, heart failure or chronic kidney disease.;Long Term: Maintenance of blood pressure at goal levels.    Lipids Yes    Intervention Provide education and support for participant on nutrition & aerobic/resistive exercise along with prescribed medications to achieve LDL 70mg , HDL >40mg .    Expected Outcomes Short Term: Participant states understanding of desired cholesterol values and is compliant with medications prescribed. Participant is following exercise prescription and nutrition guidelines.;Long Term: Cholesterol controlled with medications as prescribed, with individualized exercise RX and with personalized nutrition  plan. Value goals: LDL < 70mg , HDL > 40 mg.             Core Components/Risk Factors/Patient Goals Review:    Core Components/Risk Factors/Patient Goals at Discharge (Final Review):    ITP Comments:   Comments: Participant attended orientation for the cardiac rehabilitation program on  07/09/2022  to perform initial intake and exercise walk test. Patient introduced to the [facility] education and orientation packet was reviewed. Completed 6-minute Nustep test, measurements, initial ITP, and exercise prescription. Pt's blood pressure on entry was low; 77/48. Pt was asymptomatic and H2O provided. RN note in EPIC references this issue.Telemetry-normal sinus rhythm, frequent PVC's, asymptomatic.   Service time was from 9:30 to 11:30.

## 2022-07-09 NOTE — Progress Notes (Signed)
Cardiac Rehab Medication Review   Does the patient  feel that his/her medications are working for him/her?  yes  Has the patient been experiencing any side effects to the medications prescribed?  yes  Does the patient measure his/her own blood pressure or blood glucose at home?  yes   Does the patient have any problems obtaining medications due to transportation or finances?   no  Understanding of regimen: good Understanding of indications: good Potential of compliance: good    Comments: Pt voices that he feels the fatigue that he has is from one of the medications but he not sure which one. Pt checks his blood pressure at home 1x/day; pt does not check blood sugar.      Lorin Picket 07/09/2022 9:51 AM

## 2022-07-09 NOTE — Telephone Encounter (Signed)
Spoke with Raymond Christensen told about appointment with Robin Searing NP on 07/24/22 at 0825. Will hold cardiac rehab until cleared to start exercise per Edd Fabian NP. Tentative start date 07/26/22.Thayer Headings RN BSN

## 2022-07-10 ENCOUNTER — Ambulatory Visit: Payer: Medicare (Managed Care) | Admitting: Family Medicine

## 2022-07-10 ENCOUNTER — Encounter: Payer: Self-pay | Admitting: Family Medicine

## 2022-07-10 VITALS — BP 111/58 | HR 68 | Temp 98.0°F | Ht 67.0 in | Wt 212.4 lb

## 2022-07-10 DIAGNOSIS — I959 Hypotension, unspecified: Secondary | ICD-10-CM | POA: Diagnosis not present

## 2022-07-10 DIAGNOSIS — N400 Enlarged prostate without lower urinary tract symptoms: Secondary | ICD-10-CM | POA: Diagnosis not present

## 2022-07-10 DIAGNOSIS — I214 Non-ST elevation (NSTEMI) myocardial infarction: Secondary | ICD-10-CM | POA: Diagnosis not present

## 2022-07-10 NOTE — Progress Notes (Signed)
Chief Complaint  Patient presents with   Hospitalization Follow-up    Subjective: Patient is a 82 y.o. male here for hypertension.  He is here with his wife.  The patient went to cardiac rehab yesterday and was unable to participate due to low blood pressures.  Systolic blood pressure was less than 90 and diastolic blood pressure was less than 60.  The cardiology team was contacted and stopped his Entresto and isosorbide.  He checked his blood pressure today and it was 110/62.  He never had any symptoms such as dizziness, weakness, or lightheadedness.  He has not had any chest pain or shortness of breath.  He is wondering what we need to do until his follow-up appointment.  He is still taking metoprolol XL 12.5 mg daily.  Patient has a history of BPH.  He has failed finasteride due to failure to improve symptoms.  He is currently taking tamsulosin 0.4 mg daily.  He urinates every couple hours at night.  Stream is not terribly strong.  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 Excell Seltzer   Subungual hematoma of digit of hand 12/01/2018   Subungual hematoma of right foot 12/01/2018   Thyroid disease    Type 2 diabetes mellitus without complication, without long-term current use of insulin (HCC) 02/26/2017    Objective: BP (!) 111/58 (BP Location: Left Arm, Patient Position: Sitting, Cuff Size: Normal)   Pulse  68   Temp 98 F (36.7 C) (Oral)   Ht 5\' 7"  (1.702 m)   Wt 212 lb 6 oz (96.3 kg)   SpO2 97%   BMI 33.26 kg/m  General: Awake, appears stated age Heart: RRR, no LE edema, no bruits Lungs: CTAB, no rales, wheezes or rhonchi. No accessory muscle use Psych: Age appropriate judgment and insight, normal affect and mood  Assessment and Plan: Hypotension, unspecified hypotension type  NSTEMI (non-ST elevated myocardial infarction) (HCC)  Benign prostatic hyperplasia without urinary obstruction  Will take Entresto and isosorbide off his list for now.  Continue Toprol XL 12.5 mg daily.  Continue to monitor blood pressure and let us know if it starts to rise again.  If it does, would add back Entresto first. Recently had a NSTEMI.  Feels well currently.  Following closely with cardiology. Chronic, not controlled.  Given his low blood pressure issues, I would like to stop his tamsulosin. He will let me know if things worsen off of the medication. May need to get back in w urology.  The patient voiced understanding and agreement to the plan.  Jilda Roche [place], DO 07/10/22  4:41 PM

## 2022-07-10 NOTE — Patient Instructions (Addendum)
Keep the diet clean and stay active.  Stay hydrated.  Keep an eye on your blood pressure until your cardiology follow up.   Keep an eye on the urination and let me know if the tamsulosin was doing something beneficial.   Let us know if you need anything.

## 2022-07-16 ENCOUNTER — Ambulatory Visit: Payer: Medicare (Managed Care)

## 2022-07-17 NOTE — Addendum Note (Signed)
Addended by: Rakeya Glab P on: 07/17/2022 05:09 PM   Modules accepted: Level of Service  

## 2022-07-19 ENCOUNTER — Encounter (HOSPITAL_COMMUNITY): Payer: Medicare (Managed Care)

## 2022-07-20 ENCOUNTER — Other Ambulatory Visit: Payer: Self-pay | Admitting: Family Medicine

## 2022-07-22 ENCOUNTER — Encounter (HOSPITAL_COMMUNITY): Payer: Medicare (Managed Care)

## 2022-07-23 ENCOUNTER — Other Ambulatory Visit: Payer: Self-pay | Admitting: Family Medicine

## 2022-07-23 ENCOUNTER — Encounter: Payer: Self-pay | Admitting: Family Medicine

## 2022-07-23 MED ORDER — LEVOCETIRIZINE DIHYDROCHLORIDE 5 MG PO TABS: 5.0000 mg | ORAL_TABLET | Freq: Every evening | ORAL | 3 refills | Status: DC

## 2022-07-23 NOTE — Progress Notes (Unsigned)
Office Visit    Patient Name: Raymond Christensen Date of Encounter: 07/23/2022  Primary Care Provider:  Sharlene Dory, DO Primary Cardiologist:  Christell Constant, MD Primary Electrophysiologist: Regan Lemming, MD   Past Medical History    Past Medical History:  Diagnosis Date   Arthritis    CAD (coronary artery disease)    Carotid artery disease (HCC)    s/p left CEA 01/17/14   Chronic systolic CHF (congestive heart failure) [facility])    Concussion Summer 2012   Essential hypertension 02/26/2017   Heart disease    Hyperlipidemia    Hypothyroidism 10/15/2017   Major depressive disorder    Mitral valve regurgitation    Myocardial infarction (HCC) 1996, 2021   Obstructive sleep apnea on CPAP    Persistent atrial fibrillation (HCC)    Poor flexibility of tendon 12/01/2018   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 Excell Seltzer   Subungual hematoma of digit of hand 12/01/2018   Subungual hematoma of right foot 12/01/2018   Thyroid disease    Type 2 diabetes mellitus without complication, without long-term current use of insulin (HCC) 02/26/2017   Past Surgical History:  Procedure Laterality Date   ATRIAL FIBRILLATION ABLATION N/A 02/01/2020   Procedure: ATRIAL FIBRILLATION ABLATION;  Surgeon: Regan Lemming, MD;  Location: [facility] INVASIVE CV LAB;  Service: Cardiovascular;  Laterality: N/A;   BYPASS GRAFT  2001   CARDIAC CATHETERIZATION     CARDIOVERSION N/A 11/08/2019   Procedure: CARDIOVERSION;  Surgeon: Wendall Stade, MD;  Location: [facility] ENDOSCOPY;  Service: Cardiovascular;  Laterality: N/A;   CAROTID ENDARTERECTOMY Left 01/17/2014   CORONARY ANGIOPLASTY     CORONARY ARTERY BYPASS GRAFT     LEFT HEART CATH AND CORS/GRAFTS ANGIOGRAPHY N/A 06/26/2022   Procedure: LEFT HEART CATH AND CORS/GRAFTS ANGIOGRAPHY;  Surgeon: Lennette Bihari, MD;  Location: [facility] INVASIVE CV LAB;  Service: Cardiovascular;  Laterality: N/A;    MITRAL VALVE REPAIR N/A 12/23/2019   Procedure: MITRAL VALVE REPAIR;  Surgeon: Tonny Bollman, MD;  Location: [facility] INVASIVE CV LAB;  Service: Cardiovascular;  Laterality: N/A;   RIGHT/LEFT HEART CATH AND CORONARY/GRAFT ANGIOGRAPHY N/A 09/09/2019   Procedure: RIGHT/LEFT HEART CATH AND CORONARY/GRAFT ANGIOGRAPHY;  Surgeon: Runell Gess, MD;  Location: [facility] INVASIVE CV LAB;  Service: Cardiovascular;  Laterality: N/A;   TEE WITHOUT CARDIOVERSION N/A 09/13/2019   Procedure: TRANSESOPHAGEAL ECHOCARDIOGRAM (TEE);  Surgeon: Jodelle Red, MD;  Location: [facility] ENDOSCOPY;  Service: Cardiovascular;  Laterality: N/A;   TEE WITHOUT CARDIOVERSION N/A 12/23/2019   Procedure: TRANSESOPHAGEAL ECHOCARDIOGRAM (TEE);  Surgeon: Tonny Bollman, MD;  Location: [facility] INVASIVE CV LAB;  Service: Cardiovascular;  Laterality: N/A;   TEE WITHOUT CARDIOVERSION  02/01/2020   Procedure: TRANSESOPHAGEAL ECHOCARDIOGRAM (TEE);  Surgeon: Regan Lemming, MD;  Location: [facility] INVASIVE CV LAB;  Service: Cardiovascular;;   TOTAL HIP ARTHROPLASTY  1994, 1995    Allergies  Allergies  Allergen Reactions   Sulfa Antibiotics Rash    Questionable, he developed a diffuse rash 2 days after stopping Bactrim     History of Present Illness    [name]  is a 82 year old male with a PMH of chronic combined CHF, CAD s/p MI 4 with cardiac arrest and CABG x 4 2001 (LIMA to LAD, SVG to OM-2, SVG to right PDA, SVG to D-2) with post-op AF, CVA, carotid stenosis s/p L CEA and right subclavian stenosis, severe MR s/p MitraClip  2021 DM, HTN, obesity, OSA on CPAP,  hypothyroidism.  Mr. Sharol Harness has been followed by Dr. Katrinka Blazing for years and is now followed by Dr. Raynelle Jan for management of coronary artery disease.  He suffered a MI in 1994 that was caused by cardiac arrest and underwent CABG in 2001.  He was seen in the ED on 08/2019 with NSTEMI and underwent LHC with PCI to vein graft of the SVG-OM and PDA.  Patient was also in atrial  fibrillation and underwent TEE which showed LAA thrombus and was not able to undergo DCCV.  He was started on amiodarone and ultimately had cardioversion but reverted back to AF.  He had a 2D echo completed which showed moderately severe MR with flail segment and ruptured chordae.  He was referred to structural heart and underwent MitraClip procedure 12/2019.  He was seen by Dr. Elberta Fortis for evaluation on 01/13/2020 and underwent AF ablation and TEE on 02/01/2020 with conversion to sinus rhythm.  He was seen initially by Dr. Raynelle Jan on 05/31/2022 and reported being more sedentary with frequent napping.  He was also noted to have frequent PVCs and PACs and wore an event monitor that showed frequent PVCs.  He was seen in the ED on 06/25/22 for evaluation of NSTEMI with troponin initially 250 >> 710.  He underwent LHC showing severe CAD with diseased saphenous with 95% proximal graft stenosis with extensive thrombus in 85% stenosis in the OM stent region.  LVEDP was 45-30% medical therapy was elected at that time and patient was started on Imdur and Toprol XL daily. He was seen by Jari Favre, PA on 07/04/22 for follow-up and blood pressure was very low and Entresto was held with increased proximal end of office visit.  He was then cleared for cardiac rehab and during his orientation blood pressures were 77/48.  He was instructed to hold Imdur and Entresto and have BP follow-up 07/16/2022.  Since last being seen in the office patient reports***.  Patient denies chest pain, palpitations, dyspnea, PND, orthopnea, nausea, vomiting, dizziness, syncope, edema, weight gain, or early satiety.   ***Notes:  Home Medications    Current Outpatient Medications  Medication Sig Dispense Refill   acetaminophen (TYLENOL) 500 MG tablet Take 1,000 mg by mouth every 6 (six) hours as needed for moderate pain or headache.     atorvastatin (LIPITOR) 80 MG tablet TAKE 1 TABLET DAILY 90 tablet 2   Cholecalciferol (VITAMIN D3) 250  MCG (10000 UT) capsule Take 10,000 Units by mouth daily.      ELIQUIS 5 MG TABS tablet TAKE 1 TABLET TWICE A DAY 180 tablet 3   ezetimibe (ZETIA) 10 MG tablet Take 1 tablet (10 mg total) by mouth daily. 90 tablet 0   famotidine (PEPCID) 20 MG tablet Take 1 tablet (20 mg total) by mouth daily. 30 tablet 3   levothyroxine (SYNTHROID) 75 MCG tablet TAKE 1 TABLET DAILY 90 tablet 1   liothyronine (CYTOMEL) 5 MCG tablet TAKE 2 TABLETS DAILY (Patient taking differently: Take 10 mcg by mouth daily.) 180 tablet 3   metoprolol succinate (TOPROL-XL) 25 MG 24 hr tablet Take 0.5 tablets (12.5 mg total) by mouth daily. 45 tablet 3   Multiple Vitamins-Minerals (MULTIVITAMIN ADULT EXTRA C PO) Take 1 tablet by mouth daily.      nitroGLYCERIN (NITROSTAT) 0.4 MG SL tablet Place 1 tablet (0.4 mg total) under the tongue every 5 (five) minutes as needed for chest pain. 30 tablet 12   Semaglutide, 2 MG/DOSE, 8 MG/3ML  SOPN Inject 2 mg as directed once a week. (Patient taking differently: Inject 2 mg as directed once a week. Sunday) 9 mL 2   sertraline (ZOLOFT) 100 MG tablet Take 0.5 tablets (50 mg total) by mouth daily. (Patient taking differently: Take 100 mg by mouth daily.) 90 tablet 0   tamsulosin (FLOMAX) 0.4 MG CAPS capsule Take 0.4 mg by mouth daily after supper.     No current facility-administered medications for this visit.     Review of Systems  Please see the history of present illness.    (+)*** (+)***  All other systems reviewed and are otherwise negative except as noted above.  Physical Exam    Wt Readings from Last 3 Encounters:  07/10/22 212 lb 6 oz (96.3 kg)  07/09/22 212 lb 1.3 oz (96.2 kg)  07/04/22 207 lb 12.8 oz (94.3 kg)   ZD:GUYQI were no vitals filed for this visit.,There is no height or weight on file to calculate BMI.  Constitutional:      Appearance: Healthy appearance. Not in distress.  Neck:     Vascular: JVD normal.  Pulmonary:     Effort: Pulmonary effort is normal.      Breath sounds: No wheezing. No rales. Diminished in the bases Cardiovascular:     Normal rate. Regular rhythm. Normal S1. Normal S2.      Murmurs: There is no murmur.  Edema:    Peripheral edema absent.  Abdominal:     Palpations: Abdomen is soft non tender. There is no hepatomegaly.  Skin:    General: Skin is warm and dry.  Neurological:     General: No focal deficit present.     Mental Status: Alert and oriented to person, place and time.     Cranial Nerves: Cranial nerves are intact.  EKG/LABS/ Recent Cardiac Studies    ECG personally reviewed by me today - ***   Risk Assessment/Calculations:   {Does this patient have ATRIAL FIBRILLATION?:[phone]}        Lab Results  Component Value Date   WBC 9.2 06/27/2022   HGB 12.4 (L) 06/27/2022   HCT 38.3 (L) 06/27/2022   MCV 84.5 06/27/2022   PLT 149 (L) 06/27/2022   Lab Results  Component Value Date   CREATININE 0.80 06/27/2022   BUN 7 (L) 06/27/2022   NA 135 06/27/2022   K 3.3 (L) 06/27/2022   CL 105 06/27/2022   CO2 24 06/27/2022   Lab Results  Component Value Date   ALT 16 05/03/2022   AST 18 05/03/2022   ALKPHOS 61 09/30/2021   BILITOT 0.4 05/03/2022   Lab Results  Component Value Date   CHOL 85 06/26/2022   HDL 40 (L) 06/26/2022   LDLCALC 34 06/26/2022   LDLDIRECT 65.0 08/31/2021   TRIG 53 06/26/2022   CHOLHDL 2.1 06/26/2022    Lab Results  Component Value Date   HGBA1C 5.4 06/26/2022     Assessment & Plan    1.  Coronary artery disease:  2.  Chronic combined CHF:  3.  Paroxysmal AF:  4.  Essential hypertension/hypotension:  5.  Hyperlipidemia:  6.  Nonrheumatic mitral valve disease:      Disposition: Follow-up with Christell Constant, MD or APP in *** months {Are you ordering a CV Procedure (e.g. stress test, cath, DCCV, TEE, etc)?   Press F2        :[id number]}   Medication Adjustments/Labs and Tests Ordered: Current medicines are reviewed at length with the patient today.  Concerns regarding medicines are outlined above.   Signed, Napoleon Form, Leodis Rains, NP 07/23/2022, 8:42 AM [facility]

## 2022-07-24 ENCOUNTER — Ambulatory Visit: Payer: Medicare (Managed Care) | Attending: Nurse Practitioner | Admitting: Nurse Practitioner

## 2022-07-24 ENCOUNTER — Encounter: Payer: Self-pay | Admitting: Nurse Practitioner

## 2022-07-24 ENCOUNTER — Encounter (HOSPITAL_COMMUNITY): Payer: Medicare (Managed Care)

## 2022-07-24 VITALS — BP 114/56 | HR 76 | Ht 67.0 in | Wt 207.4 lb

## 2022-07-24 DIAGNOSIS — I10 Essential (primary) hypertension: Secondary | ICD-10-CM

## 2022-07-24 DIAGNOSIS — I059 Rheumatic mitral valve disease, unspecified: Secondary | ICD-10-CM

## 2022-07-24 DIAGNOSIS — I25110 Atherosclerotic heart disease of native coronary artery with unstable angina pectoris: Secondary | ICD-10-CM | POA: Diagnosis not present

## 2022-07-24 DIAGNOSIS — I952 Hypotension due to drugs: Secondary | ICD-10-CM | POA: Diagnosis not present

## 2022-07-24 DIAGNOSIS — I5022 Chronic systolic (congestive) heart failure: Secondary | ICD-10-CM | POA: Diagnosis not present

## 2022-07-24 DIAGNOSIS — I340 Nonrheumatic mitral (valve) insufficiency: Secondary | ICD-10-CM

## 2022-07-24 MED ORDER — ISOSORBIDE MONONITRATE ER 30 MG PO TB24: 15.0000 mg | ORAL_TABLET | Freq: Every day | ORAL | 1 refills | Status: DC | PRN

## 2022-07-24 MED ORDER — METOPROLOL SUCCINATE ER 25 MG PO TB24: 12.5000 mg | ORAL_TABLET | Freq: Every day | ORAL | 3 refills | Status: DC

## 2022-07-24 NOTE — Patient Instructions (Addendum)
Medication Instructions:  CHANGE  Imdur to as needed RESTART Entresto 24/26mg  Take 1 tablet twice a day  STOP XYZAL START CLARTIN/ZYRTEC OR ALLEGRA (PLAIN NO D ADDED) ONCE A DAY THIS MEDICATION IS OVER THE COUNTER *If you need a refill on your cardiac medications before your next appointment, please call your pharmacy*   Lab Work: 2 WEEKS BMET If you have labs (blood work) drawn today and your tests are completely normal, you will receive your results only by: MyChart Message (if you have MyChart) OR A paper copy in the mail If you have any lab test that is abnormal or we need to change your treatment, we will call you to review the results.   Testing/Procedures: NONE ORDERED   Follow-Up: At [facility], you and your health needs are our priority.  As part of our continuing mission to provide you with exceptional heart care, we have created designated Provider Care Teams.  These Care Teams include your primary Cardiologist (physician) and Advanced Practice Providers (APPs -  Physician Assistants and Nurse Practitioners) who all work together to provide you with the care you need, when you need it.  We recommend signing up for the patient portal called "MyChart".  Sign up information is provided on this After Visit Summary.  MyChart is used to connect with patients for Virtual Visits (Telemedicine).  Patients are able to view lab/test results, encounter notes, upcoming appointments, etc.  Non-urgent messages can be sent to your provider as well.   To learn more about what you can do with MyChart, go to [email or web address].    Your next appointment:   3 month(s)  Provider:   Jari Favre, PA-C       Other Instructions  INCREASE YOUR WATER INTAKE

## 2022-07-25 ENCOUNTER — Telehealth (HOSPITAL_COMMUNITY): Payer: Self-pay | Admitting: *Deleted

## 2022-07-25 NOTE — Telephone Encounter (Signed)
Spoke with Darcel Bayley he plans to start exercise tomorrow. Asked the patient to hydrate before he comes to exercise. States understanding.Thayer Headings RN BSN

## 2022-07-26 ENCOUNTER — Encounter (HOSPITAL_COMMUNITY)
Admission: RE | Admit: 2022-07-26 | Discharge: 2022-07-26 | Disposition: A | Discharge status: Home / Self Care | Payer: Medicare (Managed Care) | Source: Ambulatory Visit | Attending: Internal Medicine | Admitting: Internal Medicine

## 2022-07-26 DIAGNOSIS — I214 Non-ST elevation (NSTEMI) myocardial infarction: Secondary | ICD-10-CM | POA: Insufficient documentation

## 2022-07-26 LAB — GLUCOSE, CAPILLARY
Glucose-Capillary: 101 mg/dL — ABNORMAL HIGH (ref 70–99)
Glucose-Capillary: 84 mg/dL (ref 70–99)

## 2022-07-26 NOTE — Progress Notes (Signed)
Daily Session Note  Patient Details  Name: Raymond Christensen MRN: [id number] Date of Birth: 1940/09/21 Referring Provider:   Flowsheet Row INTENSIVE CARDIAC REHAB ORIENT from 07/09/2022 in [facility]  Referring Provider [name], MD       Encounter Date: 07/26/2022  Check In:  Session Check In - 07/26/22 1224       Check-In   Supervising physician immediately available to respond to emergencies [facility] MD immediately available    Physician(s) Jari Favre, PA-C    Location [facility]    Staff Present Lorin Picket, MS, ACSM-CEP, CCRP, Exercise Physiologist;Bailey Wallace Cullens, MS, Exercise Physiologist;Rogers Ditter Gerre Scull, RN, BSN;Johnny Hale Bogus, MS, Exercise Physiologist;Jetta Walker BS, ACSM-CEP, Exercise Physiologist    Virtual Visit No    Medication changes reported     Yes    Comments Dc flomax and emdur    Fall or balance concerns reported    No    Tobacco Cessation No Change    Warm-up and Cool-down Performed as group-led instruction   CRP2 oreintation today   Resistance Training Performed Yes    VAD Patient? No    PAD/SET Patient? No      Pain Assessment   Currently in Pain? No/denies    Pain Score 0-No pain    Multiple Pain Sites No             Capillary Blood Glucose: No results found for this or any previous visit (from the past 24 hour(s)).   Exercise Prescription Changes - 07/26/22 1400       Response to Exercise   Blood Pressure (Admit) 118/52    Blood Pressure (Exercise) 120/60    Blood Pressure (Exit) 110/60    Heart Rate (Admit) 73 bpm    Heart Rate (Exercise) 89 bpm    Heart Rate (Exit) 77 bpm    Rating of Perceived Exertion (Exercise) 10    Symptoms None    Comments Pt's first day in the CRP2 program    Duration Progress to 30 minutes of  aerobic without signs/symptoms of physical distress    Intensity THRR unchanged      Progression   Progression Continue to progress  workloads to maintain intensity without signs/symptoms of physical distress.    Average METs 2.2      Resistance Training   Training Prescription Yes    Weight 3 lbs    Reps 10-15    Time 10 Minutes      Interval Training   Interval Training No      NuStep   Level 1    SPM 103    Minutes 25    METs 2.2             Social History   Tobacco Use  Smoking Status Former   Current packs/day: 0.00   Types: Cigarettes   Quit date: 1980   Years since quitting: 44.5  Smokeless Tobacco Never    Goals Met:  Exercise tolerated well No report of concerns or symptoms today Strength training completed today  Goals Unmet:  Pt did not eat prior to coming to class.   Comments: Educated pt on always eating before coming to exercise. Pt had breakfast around 8am and no lunch or snack prior to exercising at 12:30. Pt given snack of graham crackers. Pt on GLP-1 for weight loss. Diet controlled DM. Per policy, BS checked prior to exercise, BS 101mg /dL. Post BS 84mg /dL. Encouraged pt to  eat snack when he returns home. Pt acknowledges and understands without assistance.   Pt started cardiac rehab today. Pt tolerated light exercise without difficulty. VSS, telemetry - ventricular bigeminy noted for about 45sec, then NSR with occasional PVCs, all have been previously documented. Medication list reviewed, no changes from orientation. Pt denies barriers to medicaiton compliance.  PSYCHOSOCIAL ASSESSMENT:  PHQ 2-9/PHQ-9 scores 0/4. Pt has history of depression. Pt currently and compliant with psychotropic meds. Pt states his mental health is stable. Pt exhibits positive coping skills, hopeful outlook with supportive family. Pt denies any needs at this time. Pt oriented to exercise equipment and routine.    Understanding verbalized.    Dr. Armanda Magic is Medical Director for Cardiac Rehab at [facility].

## 2022-07-29 ENCOUNTER — Encounter (HOSPITAL_COMMUNITY)
Admission: RE | Admit: 2022-07-29 | Discharge: 2022-07-29 | Disposition: A | Discharge status: Home / Self Care | Payer: Medicare (Managed Care) | Source: Ambulatory Visit | Attending: Internal Medicine | Admitting: Internal Medicine

## 2022-07-29 ENCOUNTER — Telehealth: Payer: Self-pay | Admitting: Internal Medicine

## 2022-07-29 DIAGNOSIS — I214 Non-ST elevation (NSTEMI) myocardial infarction: Secondary | ICD-10-CM

## 2022-07-29 LAB — GLUCOSE, CAPILLARY: Glucose-Capillary: 145 mg/dL — ABNORMAL HIGH (ref 70–99)

## 2022-07-29 NOTE — Telephone Encounter (Signed)
Caller stated patient passed out and fell at rehab and per Oris Drone needs to be seen with DOD this week.

## 2022-07-29 NOTE — Progress Notes (Signed)
Incomplete Session Note  Patient Details  Name: Raymond Christensen MRN: [id number] Date of Birth: 02/21/1940 Referring Provider:   Flowsheet Row INTENSIVE CARDIAC REHAB ORIENT from 07/09/2022 in [facility] for [facility], V[facility], [facility]  Referring Provider [name], MD       [name] did not complete his rehab session.  [name] reported that he passed out in the bathroom  and fell last night.  Denies symptoms today. Sitting blood pressure 84/58. Standing blood pressure 102/68 heart rate 76. Telemetry rhythm Sinus with PVC's. CBG 145.  Medications reviewed. Discussed with onsite provider Edd Fabian NP. [name] said that [name] needs to be evaluated in the office. [name] did not exercise today. Spoke with [facility] triage nurse. Maryann obtained appointment for [name] to see Dr Nelly Laurence on 07/30/22 at 10:30. Patient left cardiac rehab without complaints or symptoms. Will need clearance for the patient to resume exercise at cardiac rehab.Thayer Headings RN BSN

## 2022-07-29 NOTE — Telephone Encounter (Signed)
Spoke with Byrd Hesselbach from Cardiac rehab who reports patient has been experiencing syncope and passed out at home yesterday. Today he is hypotensive. Waymon Amato, NP recommended patient be evaluated in office.  Scheduled with DOD for 07/30/22.

## 2022-07-30 ENCOUNTER — Ambulatory Visit: Payer: Medicare (Managed Care) | Attending: Cardiovascular Disease | Admitting: Cardiovascular Disease

## 2022-07-30 ENCOUNTER — Encounter: Payer: Self-pay | Admitting: Cardiovascular Disease

## 2022-07-30 VITALS — BP 100/64 | HR 67 | Ht 67.0 in | Wt 210.2 lb

## 2022-07-30 DIAGNOSIS — I4819 Other persistent atrial fibrillation: Secondary | ICD-10-CM

## 2022-07-30 DIAGNOSIS — I493 Ventricular premature depolarization: Secondary | ICD-10-CM

## 2022-07-30 NOTE — Progress Notes (Signed)
Electrophysiology Office Note:    Date:  07/30/2022   ID:  Raymond Christensen, DOB [birth year], MRN [id number]  PCP:  Sharlene Dory, DO   [facility] Providers Cardiologist:  Christell Constant, MD Electrophysiologist:  Will Jorja Loa, MD     Referring MD: Sharlene Dory*   History of Present Illness:    Raymond Christensen is a 82 y.o. male with a medical history significant for coronary disease status post post MI, obstructive sleep apnea on CPAP, mitral valve clip, diabetes, referred for evaluation of syncope.     He is seen today as a doc of the day visit after recent syncope.  He presented to cardiac rehab yesterday and noted that he had passed out in the bathroom the previous night.  His initial comment to me is, "I have a history of vasovagal syncope."  He was standing at the toilet urinating when he felt onset of nausea followed by lightheadedness and syncope.  He has had this least 3 times over the years, most recently about a year ago.  He did not injure himself in the fall.  He also notes that he has had 2 falls at [facility], but these appear to be mechanical as there was no lightheadedness or loss of consciousness or associated nausea.  He has not made an attempt to lie down in the past with onset of nausea.  He notes that his medications have been adjusted recently and Entresto was restarted not too long ago.      He has no acute complaints today.  He does feel dizzy on standing.  Orthostatic vital signs was performed.  Lying down blood pressure 100/52, after standing for 3 minutes his blood pressure was 82/40.  He felt lightheaded at this time.  EKGs/Labs/Other Studies Reviewed Today:    Echocardiogram:  TTE June 26, 2022 EF 45 to 50%.  MitraClip with mitral regurgitation medial to the clip   Monitors:  06/13/22 Zio - my interpretation Sinus rhythm 51 to 110 bpm, average 70 1 episode of nonsustained VT lasting less than 6  seconds 5.5% burden of PVCs  Stress testing:   Advanced imaging:   Cardiac catherization  July 30, 2022 Complex disease, reviewed in epic  EKG:   EKG Interpretation Date/Time:  Tuesday July 30 2022 10:39:01 EDT Ventricular Rate:  69 PR Interval:  212 QRS Duration:  92 QT Interval:  410 QTC Calculation: 439 R Axis:   6  Text Interpretation: Sinus rhythm with 1st degree A-V block with occasional Premature ventricular complexes Inferior infarct , age undetermined When compared with ECG of 26-Jun-2022 03:33, PREVIOUS ECG IS PRESENT Confirmed by York Pellant [phone]) on 07/30/2022 11:03:04 AM     Physical Exam:    VS:  BP 100/64   Pulse 67   Ht 5\' 7"  (1.702 m)   Wt 210 lb 3.2 oz (95.3 kg)   SpO2 98%   BMI 32.92 kg/m     Wt Readings from Last 3 Encounters:  07/30/22 210 lb 3.2 oz (95.3 kg)  07/24/22 207 lb 6.4 oz (94.1 kg)  07/10/22 212 lb 6 oz (96.3 kg)     GEN:  Well nourished, well developed in no acute distress CARDIAC: RRR, no murmurs, rubs, gallops RESPIRATORY:  Normal work of breathing MUSCULOSKELETAL: no edema    ASSESSMENT & PLAN:    Syncope Classic vasovagal --micturition syncope He has a prodrome of nausea I advised him to lie down if he has onset of nausea during  typical triggers such as using the bathroom If he has recurrence despite this, I would consider placing a loop recorder to see if he has substantially prolonged pauses.  Pacemaker is rarely indicated for neurocardiac syncope  Lightheadedness with standing He does not quite meet positive criteria for orthostatic signs, but his blood pressure is low and he is lightheaded with standing And when to have him discontinue Entresto again and follow-up with his primary     Signed, Maurice Small, MD  07/30/2022 11:18 AM    [facility]

## 2022-07-30 NOTE — Patient Instructions (Signed)
Medication Instructions:  STOP Entresto *If you need a refill on your cardiac medications before your next appointment, please call your pharmacy*   Follow-Up: At [facility], you and your health needs are our priority.  As part of our continuing mission to provide you with exceptional heart care, we have created designated Provider Care Teams.  These Care Teams include your primary Cardiologist (physician) and Advanced Practice Providers (APPs -  Physician Assistants and Nurse Practitioners) who all work together to provide you with the care you need, when you need it.  We recommend signing up for the patient portal called "MyChart".  Sign up information is provided on this After Visit Summary.  MyChart is used to connect with patients for Virtual Visits (Telemedicine).  Patients are able to view lab/test results, encounter notes, upcoming appointments, etc.  Non-urgent messages can be sent to your provider as well.   To learn more about what you can do with MyChart, go to [email or web address].    Your next appointment:   1 - 2 month(s)  Provider:   Christell Constant, MD

## 2022-07-31 ENCOUNTER — Ambulatory Visit: Payer: Medicare (Managed Care) | Admitting: *Deleted

## 2022-07-31 ENCOUNTER — Encounter (HOSPITAL_COMMUNITY)
Admission: RE | Admit: 2022-07-31 | Discharge: 2022-07-31 | Disposition: A | Discharge status: Home / Self Care | Payer: Medicare (Managed Care) | Source: Ambulatory Visit | Attending: Internal Medicine | Admitting: Internal Medicine

## 2022-07-31 ENCOUNTER — Telehealth (HOSPITAL_COMMUNITY): Payer: Self-pay | Admitting: *Deleted

## 2022-07-31 VITALS — BP 101/63 | HR 75 | Ht 67.0 in | Wt 208.8 lb

## 2022-07-31 DIAGNOSIS — Z0000 Encounter for general adult medical examination without abnormal findings: Secondary | ICD-10-CM

## 2022-07-31 DIAGNOSIS — I214 Non-ST elevation (NSTEMI) myocardial infarction: Secondary | ICD-10-CM

## 2022-07-31 LAB — GLUCOSE, CAPILLARY
Glucose-Capillary: 93 mg/dL (ref 70–99)
Glucose-Capillary: 82 mg/dL (ref 70–99)

## 2022-07-31 NOTE — Patient Instructions (Signed)
Raymond Christensen , Thank you for taking time to come for your Medicare Wellness Visit. I appreciate your ongoing commitment to your health goals. Please review the following plan we discussed and let me know if I can assist you in the future.     This is a list of the screening recommended for you and due dates:  Health Maintenance  Topic Date Due   COVID-19 Vaccine (8 - 2023-24 season) 11/02/2022*   Flu Shot  08/15/2022   Yearly kidney health urinalysis for diabetes  09/01/2022   Complete foot exam   09/01/2022   Hemoglobin A1C  12/26/2022   Eye exam for diabetics  04/02/2023   Yearly kidney function blood test for diabetes  06/27/2023   Medicare Annual Wellness Visit  07/31/2023   DTaP/Tdap/Td vaccine (3 - Td or Tdap) 07/15/2030   Pneumonia Vaccine  Completed   Zoster (Shingles) Vaccine  Completed   HPV Vaccine  Aged Out  *Topic was postponed. The date shown is not the original due date.    Next appointment: Follow up in one year for your annual wellness visit.   Preventive Care 86 Years and Older, Male Preventive care refers to lifestyle choices and visits with your health care provider that can promote health and wellness. What does preventive care include? A yearly physical exam. This is also called an annual well check. Dental exams once or twice a year. Routine eye exams. Ask your health care provider how often you should have your eyes checked. Personal lifestyle choices, including: Daily care of your teeth and gums. Regular physical activity. Eating a healthy diet. Avoiding tobacco and drug use. Limiting alcohol use. Practicing safe sex. Taking low doses of aspirin every day. Taking vitamin and mineral supplements as recommended by your health care provider. What happens during an annual well check? The services and screenings done by your health care provider during your annual well check will depend on your age, overall health, lifestyle risk factors, and family history  of disease. Counseling  Your health care provider may ask you questions about your: Alcohol use. Tobacco use. Drug use. Emotional well-being. Home and relationship well-being. Sexual activity. Eating habits. History of falls. Memory and ability to understand (cognition). Work and work Astronomer. Screening  You may have the following tests or measurements: Height, weight, and BMI. Blood pressure. Lipid and cholesterol levels. These may be checked every 5 years, or more frequently if you are over 65 years old. Skin check. Lung cancer screening. You may have this screening every year starting at age 4 if you have a 30-pack-year history of smoking and currently smoke or have quit within the past 15 years. Fecal occult blood test (FOBT) of the stool. You may have this test every year starting at age 54. Flexible sigmoidoscopy or colonoscopy. You may have a sigmoidoscopy every 5 years or a colonoscopy every 10 years starting at age 22. Prostate cancer screening. Recommendations will vary depending on your family history and other risks. Hepatitis C blood test. Hepatitis B blood test. Sexually transmitted disease (STD) testing. Diabetes screening. This is done by checking your blood sugar (glucose) after you have not eaten for a while (fasting). You may have this done every 1-3 years. Abdominal aortic aneurysm (AAA) screening. You may need this if you are a current or former smoker. Osteoporosis. You may be screened starting at age 71 if you are at high risk. Talk with your health care provider about your test results, treatment options, and if  necessary, the need for more tests. Vaccines  Your health care provider may recommend certain vaccines, such as: Influenza vaccine. This is recommended every year. Tetanus, diphtheria, and acellular pertussis (Tdap, Td) vaccine. You may need a Td booster every 10 years. Zoster vaccine. You may need this after age 65. Pneumococcal 13-valent  conjugate (PCV13) vaccine. One dose is recommended after age 24. Pneumococcal polysaccharide (PPSV23) vaccine. One dose is recommended after age 54. Talk to your health care provider about which screenings and vaccines you need and how often you need them. This information is not intended to replace advice given to you by your health care provider. Make sure you discuss any questions you have with your health care provider. Document Released: 01/27/2015 Document Revised: 09/20/2015 Document Reviewed: 11/01/2014 Elsevier Interactive Patient Education  2017 [employer].  Fall Prevention in the Home Falls can cause injuries. They can happen to people of all ages. There are many things you can do to make your home safe and to help prevent falls. What can I do on the outside of my home? Regularly fix the edges of walkways and driveways and fix any cracks. Remove anything that might make you trip as you walk through a door, such as a raised step or threshold. Trim any bushes or trees on the path to your home. Use bright outdoor lighting. Clear any walking paths of anything that might make someone trip, such as rocks or tools. Regularly check to see if handrails are loose or broken. Make sure that both sides of any steps have handrails. Any raised decks and porches should have guardrails on the edges. Have any leaves, snow, or ice cleared regularly. Use sand or salt on walking paths during winter. Clean up any spills in your garage right away. This includes oil or grease spills. What can I do in the bathroom? Use night lights. Install grab bars by the toilet and in the tub and shower. Do not use towel bars as grab bars. Use non-skid mats or decals in the tub or shower. If you need to sit down in the shower, use a plastic, non-slip stool. Keep the floor dry. Clean up any water that spills on the floor as soon as it happens. Remove soap buildup in the tub or shower regularly. Attach bath mats  securely with double-sided non-slip rug tape. Do not have throw rugs and other things on the floor that can make you trip. What can I do in the bedroom? Use night lights. Make sure that you have a light by your bed that is easy to reach. Do not use any sheets or blankets that are too big for your bed. They should not hang down onto the floor. Have a firm chair that has side arms. You can use this for support while you get dressed. Do not have throw rugs and other things on the floor that can make you trip. What can I do in the kitchen? Clean up any spills right away. Avoid walking on wet floors. Keep items that you use a lot in easy-to-reach places. If you need to reach something above you, use a strong step stool that has a grab bar. Keep electrical cords out of the way. Do not use floor polish or wax that makes floors slippery. If you must use wax, use non-skid floor wax. Do not have throw rugs and other things on the floor that can make you trip. What can I do with my stairs? Do not leave any items  on the stairs. Make sure that there are handrails on both sides of the stairs and use them. Fix handrails that are broken or loose. Make sure that handrails are as long as the stairways. Check any carpeting to make sure that it is firmly attached to the stairs. Fix any carpet that is loose or worn. Avoid having throw rugs at the top or bottom of the stairs. If you do have throw rugs, attach them to the floor with carpet tape. Make sure that you have a light switch at the top of the stairs and the bottom of the stairs. If you do not have them, ask someone to add them for you. What else can I do to help prevent falls? Wear shoes that: Do not have high heels. Have rubber bottoms. Are comfortable and fit you well. Are closed at the toe. Do not wear sandals. If you use a stepladder: Make sure that it is fully opened. Do not climb a closed stepladder. Make sure that both sides of the stepladder  are locked into place. Ask someone to hold it for you, if possible. Clearly mark and make sure that you can see: Any grab bars or handrails. First and last steps. Where the edge of each step is. Use tools that help you move around (mobility aids) if they are needed. These include: Canes. Walkers. Scooters. Crutches. Turn on the lights when you go into a dark area. Replace any light bulbs as soon as they burn out. Set up your furniture so you have a clear path. Avoid moving your furniture around. If any of your floors are uneven, fix them. If there are any pets around you, be aware of where they are. Review your medicines with your doctor. Some medicines can make you feel dizzy. This can increase your chance of falling. Ask your doctor what other things that you can do to help prevent falls. This information is not intended to replace advice given to you by your health care provider. Make sure you discuss any questions you have with your health care provider. Document Released: 10/27/2008 Document Revised: 06/08/2015 Document Reviewed: 02/04/2014 Elsevier Interactive Patient Education  2017 [employer].

## 2022-07-31 NOTE — Telephone Encounter (Signed)
good morning Dr Nelly Laurence. Thank you for seeing Mr Raymond Christensen at the office yesterday. Is Mr Raymond Christensen okay to resume exercise at cardiac rehab?  Now AM Maurice Small, MD Yes   Patient called and notified he plans to return to exercise today.Thayer Headings RN BSN

## 2022-07-31 NOTE — Progress Notes (Signed)
Subjective:   Raymond Christensen is a 82 y.o. male who presents for Medicare Annual/Subsequent preventive examination.  Visit Complete: In person   Review of Systems     Cardiac Risk Factors include: advanced age (>90men, >77 women);obesity (BMI >30kg/m2);diabetes mellitus;dyslipidemia;male gender;hypertension     Objective:    Today's Vitals   07/31/22 1011  BP: 101/63  Pulse: 75  Weight: 208 lb 12.8 oz (94.7 kg)  Height: 5\' 7"  (1.702 m)   Body mass index is 32.7 kg/m.     07/31/2022   10:02 AM 06/26/2022    1:51 PM 06/25/2022   10:44 PM 03/11/2022   10:28 AM 02/14/2022   11:52 AM 02/01/2022    3:25 PM 09/30/2021    3:04 PM  Advanced Directives  Does Patient Have a Medical Advance Directive? Yes  No Yes No No Yes  Type of Estate agent of [place];Living will   Healthcare Power of [place];Living will   Living will;Healthcare Power of Attorney  Does patient want to make changes to medical advance directive? No - Patient declined        Copy of Healthcare Power of Attorney in Chart? Yes - validated most recent copy scanned in chart (See row information)        Would patient like information on creating a medical advance directive?  No - Patient declined         Current Medications (verified) Outpatient Encounter Medications as of 07/31/2022  Medication Sig   acetaminophen (TYLENOL) 500 MG tablet Take 1,000 mg by mouth every 6 (six) hours as needed for moderate pain or headache.   aspirin EC (ASPIR-LOW) 81 MG tablet Take 81 mg by mouth daily.   atorvastatin (LIPITOR) 80 MG tablet TAKE 1 TABLET DAILY   Cholecalciferol (VITAMIN D3) 250 MCG (10000 UT) capsule Take 10,000 Units by mouth daily.    ELIQUIS 5 MG TABS tablet TAKE 1 TABLET TWICE A DAY   ezetimibe (ZETIA) 10 MG tablet Take 1 tablet (10 mg total) by mouth daily.   famotidine (PEPCID) 20 MG tablet Take 1 tablet (20 mg total) by mouth daily.   Iron, Ferrous Sulfate, 325 (65 Fe) MG TABS Take 1 tablet by  mouth daily at 6 (six) AM.   levothyroxine (SYNTHROID) 75 MCG tablet TAKE 1 TABLET DAILY   liothyronine (CYTOMEL) 5 MCG tablet TAKE 2 TABLETS DAILY (Patient taking differently: Take 10 mcg by mouth daily.)   metoprolol succinate (TOPROL-XL) 25 MG 24 hr tablet Take 0.5 tablets (12.5 mg total) by mouth daily.   Multiple Vitamins-Minerals (MULTIVITAMIN ADULT EXTRA C PO) Take 1 tablet by mouth daily.    nitroGLYCERIN (NITROSTAT) 0.4 MG SL tablet Place 1 tablet (0.4 mg total) under the tongue every 5 (five) minutes as needed for chest pain.   Semaglutide, 2 MG/DOSE, 8 MG/3ML SOPN Inject 2 mg as directed once a week. (Patient taking differently: Inject 2 mg as directed once a week. Sunday)   sertraline (ZOLOFT) 100 MG tablet Take 0.5 tablets (50 mg total) by mouth daily. (Patient taking differently: Take 100 mg by mouth daily.)   Vibegron (GEMTESA) 75 MG TABS Take 1 tablet by mouth every other day.   [DISCONTINUED] isosorbide mononitrate (IMDUR) 30 MG 24 hr tablet Take 0.5 tablets (15 mg total) by mouth daily as needed (chest pain or palpitations). (Patient not taking: Reported on 07/30/2022)   No facility-administered encounter medications on file as of 07/31/2022.    Allergies (verified) Sulfa antibiotics   History: Past  Medical History:  Diagnosis Date   Arthritis    CAD (coronary artery disease)    Carotid artery disease (HCC)    s/p left CEA 01/17/14   Chronic systolic CHF (congestive heart failure) (HCC)    Concussion Summer 2012   Essential hypertension 02/26/2017   Heart disease    Hyperlipidemia    Hypothyroidism 10/15/2017   Major depressive disorder    Mitral valve regurgitation    Myocardial infarction (HCC) 1996, 2021   Obstructive sleep apnea on CPAP    Persistent atrial fibrillation (HCC)    Poor flexibility of tendon 12/01/2018   S/P mitral valve clip implantation 12/23/2019   s/p TEER with MitraClip with one XTW by Dr. Excell Seltzer   Subungual hematoma of digit of hand 12/01/2018    Subungual hematoma of right foot 12/01/2018   Thyroid disease    Type 2 diabetes mellitus without complication, without long-term current use of insulin (HCC) 02/26/2017   Past Surgical History:  Procedure Laterality Date   ATRIAL FIBRILLATION ABLATION N/A 02/01/2020   Procedure: ATRIAL FIBRILLATION ABLATION;  Surgeon: Regan Lemming, MD;  Location: [facility] INVASIVE CV LAB;  Service: Cardiovascular;  Laterality: N/A;   BYPASS GRAFT  2001   CARDIAC CATHETERIZATION     CARDIOVERSION N/A 11/08/2019   Procedure: CARDIOVERSION;  Surgeon: Wendall Stade, MD;  Location: [facility] ENDOSCOPY;  Service: Cardiovascular;  Laterality: N/A;   CAROTID ENDARTERECTOMY Left 01/17/2014   CORONARY ANGIOPLASTY     CORONARY ARTERY BYPASS GRAFT     LEFT HEART CATH AND CORS/GRAFTS ANGIOGRAPHY N/A 06/26/2022   Procedure: LEFT HEART CATH AND CORS/GRAFTS ANGIOGRAPHY;  Surgeon: Lennette Bihari, MD;  Location: [facility] INVASIVE CV LAB;  Service: Cardiovascular;  Laterality: N/A;   MITRAL VALVE REPAIR N/A 12/23/2019   Procedure: MITRAL VALVE REPAIR;  Surgeon: Tonny Bollman, MD;  Location[facility] INVASIVE CV LAB;  Service: Cardiovascular;  Laterality: N/A;   RIGHT/LEFT HEART CATH AND CORONARY/GRAFT ANGIOGRAPHY N/A 09/09/2019   Procedure: RIGHT/LEFT HEART CATH AND CORONARY/GRAFT ANGIOGRAPHY;  Surgeon: Runell Gess, MD;  Location: [facility] INVASIVE CV LAB;  Service: Cardiovascular;  Laterality: N/A;   TEE WITHOUT CARDIOVERSION N/A 09/13/2019   Procedure: TRANSESOPHAGEAL ECHOCARDIOGRAM (TEE);  Surgeon: Jodelle Red, MD;  Location: [facility] ENDOSCOPY;  Service: Cardiovascular;  Laterality: N/A;   TEE WITHOUT CARDIOVERSION N/A 12/23/2019   Procedure: TRANSESOPHAGEAL ECHOCARDIOGRAM (TEE);  Surgeon: Tonny Bollman, MD;  Location: [facility] INVASIVE CV LAB;  Service: Cardiovascular;  Laterality: N/A;   TEE WITHOUT CARDIOVERSION  02/01/2020   Procedure: TRANSESOPHAGEAL ECHOCARDIOGRAM (TEE);  Surgeon: Regan Lemming, MD;  Location: [facility] INVASIVE  CV LAB;  Service: Cardiovascular;;   TOTAL HIP ARTHROPLASTY  1994, 1995   Family History  Problem Relation Age of Onset   Dementia Father        Concerns surrounding Lewy body dementia, but never officially diagnosed   Cancer Neg Hx    Social History   Socioeconomic History   Marital status: Married    Spouse name: Not on file   Number of children: 2   Years of education: 54   Highest education level: Doctorate  Occupational History   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  Tobacco Use   Smoking status: Former    Current packs/day: 0.00    Types: Cigarettes    Quit date: 1980    Years since quitting: 44.5   Smokeless tobacco: Never  Vaping Use   Vaping status: Never Used  Substance and Sexual Activity   Alcohol use: Yes    Alcohol/week:  4.0 standard drinks of alcohol    Types: 1 Cans of beer, 3 Standard drinks or equivalent per week    Comment: 1 drink 3 times per week   Drug use: No   Sexual activity: Yes  Other Topics Concern   Not on file  Social History Narrative   Pt is R handed   Lives in single story home with his wife, Okey Regal   Has 2 adult children   PhD in Biology   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 [employer]    Social Determinants of Health   Financial Resource Strain: Low Risk  (07/31/2022)   Overall Financial Resource Strain (CARDIA)    Difficulty of Paying Living Expenses: Not hard at all  Food Insecurity: No Food Insecurity (06/26/2022)   Hunger Vital Sign    Worried About Running Out of Food in the Last Year: Never true    Ran Out of Food in the Last Year: Never true  Transportation Needs: No Transportation Needs (06/26/2022)   PRAPARE - Administrator, Civil Service (Medical): No    Lack of Transportation (Non-Medical): No  Physical Activity: Inactive (07/31/2022)   Exercise Vital Sign    Days of Exercise per Week: 0 days    Minutes of Exercise per Session: 0 min  Stress: No Stress Concern Present (07/31/2022)   [employer] of Occupational  Health - Occupational Stress Questionnaire    Feeling of Stress : Not at all  Social Connections: Socially Integrated (07/31/2022)   Social Connection and Isolation Panel [NHANES]    Frequency of Communication with Friends and Family: Three times a week    Frequency of Social Gatherings with Friends and Family: Once a week    Attends Religious Services: More than 4 times per year    Active Member of [employer] or Organizations: Yes    Attends Engineer, structural: More than 4 times per year    Marital Status: Married    Tobacco Counseling Counseling given: Not Answered   Clinical Intake:  Pre-visit preparation completed: Yes  Pain : No/denies pain  BMI - recorded: 32.7 Nutritional Status: BMI > 30  Obese Nutritional Risks: None Diabetes: Yes CBG done?: No Did pt. bring in CBG monitor from home?: No  How often do you need to have someone help you when you read instructions, pamphlets, or other written materials from your doctor or pharmacy?: 1 - Never  Interpreter Needed?: No  Information entered by :: Donne Anon, CMA   Activities of Daily Living    07/31/2022   10:13 AM 06/26/2022    1:39 PM  In your present state of health, do you have any difficulty performing the following activities:  Hearing? 1 0  Comment wears hearing aids   Vision? 0 0  Difficulty concentrating or making decisions? 0 0  Walking or climbing stairs? 0 0  Dressing or bathing? 0 0  Doing errands, shopping? 0 0  Preparing Food and eating ? N   Using the Toilet? N   In the past six months, have you accidently leaked urine? N   Do you have problems with loss of bowel control? N   Managing your Medications? N   Managing your Finances? N   Housekeeping or managing your Housekeeping? N     Patient Care Team: Sharlene Dory, DO as PCP - General (Family Medicine) Regan Lemming, MD as PCP - Electrophysiology (Cardiology) Christell Constant, MD as PCP - Cardiology  (Cardiology) Karel Jarvis,  Lesle Chris, MD as Consulting Physician (Neurology)  Indicate any recent Medical Services you may have received from other than [facility] providers in the past year (date may be approximate).     Assessment:   This is a routine wellness examination for [place].  Hearing/Vision screen No results found.  Dietary issues and exercise activities discussed:     Goals Addressed   None    Depression Screen    07/31/2022   10:18 AM 07/09/2022   10:06 AM 05/29/2022   11:13 AM 02/11/2022    2:08 PM 07/27/2021   10:23 AM 03/02/2021    9:18 AM 07/24/2020    3:07 PM  PHQ 2/9 Scores  PHQ - 2 Score 0 0 0 2 1 2  0  PHQ- 9 Score  4 0 8       Fall Risk    07/31/2022   10:15 AM 07/29/2022    1:53 PM 07/26/2022   12:25 PM 07/09/2022   12:56 PM 07/27/2021   10:23 AM  Fall Risk   Falls in the past year? 1 1 1 1  0  Number falls in past yr: 0 1 1 1  0  Injury with Fall? 0 1 1 1  0  Risk for fall due to : Impaired balance/gait History of fall(s);Impaired balance/gait;Medication side effect;Impaired vision History of fall(s);Impaired balance/gait;Impaired vision;Medication side effect History of fall(s);Impaired balance/gait;Impaired vision;Medication side effect No Fall Risks  Risk for fall due to: Comment    scored 1 second on balance test   Follow up Falls evaluation completed Falls evaluation completed;Falls prevention discussed Falls evaluation completed Falls evaluation completed Falls evaluation completed    MEDICARE RISK AT HOME:  Medicare Risk at Home - 07/31/22 1014     Any stairs in or around the home? No    If so, are there any without handrails? No    Home free of loose throw rugs in walkways, pet beds, electrical cords, etc? Yes    Adequate lighting in your home to reduce risk of falls? Yes    Life alert? No    Use of a cane, walker or w/c? No    Grab bars in the bathroom? No    Shower chair or bench in shower? Yes   built in seat   Elevated toilet seat or a handicapped  toilet? Yes             TIMED UP AND GO:  Was the test performed?  Yes  Length of time to ambulate 10 feet: 6 sec Gait steady and fast without use of assistive device    Cognitive Function:    01/12/2018    3:20 PM  MMSE - Mini Mental State Exam  Orientation to time 5  Orientation to Place 5  Registration 3  Attention/ Calculation 5  Recall 2  Language- name 2 objects 2  Language- repeat 1  Language- follow 3 step command 3  Language- read & follow direction 1  Write a sentence 1  Copy design 1  Total score 29      03/19/2018   10:00 AM  Montreal Cognitive Assessment   Visuospatial/ Executive (0/5) 5  Naming (0/3) 3  Attention: Read list of digits (0/2) 2  Attention: Read list of letters (0/1) 0  Attention: Serial 7 subtraction starting at 100 (0/3) 2  Language: Repeat phrase (0/2) 2  Language : Fluency (0/1) 1  Abstraction (0/2) 2  Delayed Recall (0/5) 0  Orientation (0/6) 6  Total 23  07/31/2022   10:19 AM 07/27/2021   10:33 AM  6CIT Screen  What Year? 0 points 0 points  What month? 0 points 0 points  What time? 0 points 0 points  Count back from 20 0 points 0 points  Months in reverse 0 points 0 points  Repeat phrase 0 points 2 points  Total Score 0 points 2 points    Immunizations Immunization History  Administered Date(s) Administered   Fluad Quad(high Dose 65+) 09/23/2018, 10/24/2020   Influenza Split 09/28/2014, 10/03/2015   Influenza, High Dose Seasonal PF 11/08/2013, 09/23/2014, 10/22/2016, 10/15/2017, 10/04/2019   Influenza-Unspecified 09/30/2014, 10/03/2015, 10/26/2017, 09/15/2018   Moderna Sars-Covid-2 Vaccination 11/13/2021   PFIZER(Purple Top)SARS-COV-2 Vaccination 01/24/2019, 02/14/2019, 10/14/2019, 04/25/2020   Pfizer Covid-19 Vaccine Bivalent Booster 64yrs & up 10/10/2020, 06/14/2021   Pneumococcal Conjugate-13 03/28/2009   Pneumococcal Polysaccharide-23 11/22/2002, 03/28/2009, 01/02/2015   Respiratory Syncytial Virus  Vaccine,Recomb Aduvanted(Arexvy) 10/04/2021   Tdap 02/09/2018, 07/14/2020   Zoster Recombinant(Shingrix) 06/12/2016, 08/26/2016   Zoster, Live 11/29/2008    TDAP status: Up to date  Flu Vaccine status: Up to date  Pneumococcal vaccine status: Up to date  Covid-19 vaccine status: Information provided on how to obtain vaccines.   Qualifies for Shingles Vaccine? Yes   Zostavax completed Yes   Shingrix Completed?: Yes  Screening Tests Health Maintenance  Topic Date Due   Medicare Annual Wellness (AWV)  07/28/2022   COVID-19 Vaccine (8 - 2023-24 season) 11/02/2022 (Originally 01/08/2022)   INFLUENZA VACCINE  08/15/2022   Diabetic kidney evaluation - Urine ACR  09/01/2022   FOOT EXAM  09/01/2022   HEMOGLOBIN A1C  12/26/2022   OPHTHALMOLOGY EXAM  04/02/2023   Diabetic kidney evaluation - eGFR measurement  06/27/2023   DTaP/Tdap/Td (3 - Td or Tdap) 07/15/2030   Pneumonia Vaccine 36+ Years old  Completed   Zoster Vaccines- Shingrix  Completed   HPV VACCINES  Aged Out    Health Maintenance  Health Maintenance Due  Topic Date Due   Medicare Annual Wellness (AWV)  07/28/2022    Colorectal cancer screening: No longer required.   Lung Cancer Screening: (Low Dose CT Chest recommended if Age 30-80 years, 20 pack-year currently smoking OR have quit w/in 15years.) does not qualify.   Additional Screening:  Hepatitis C Screening: does not qualify  Vision Screening: Recommended annual ophthalmology exams for early detection of glaucoma and other disorders of the eye. Is the patient up to date with their annual eye exam?  Yes  Who is the provider or what is the name of the office in which the patient attends annual eye exams? Dr. Cathey Endow If pt is not established with a provider, would they like to be referred to a provider to establish care? No .   Dental Screening: Recommended annual dental exams for proper oral hygiene  Diabetic Foot Exam: Diabetic Foot Exam: Completed  08/31/21  Community Resource Referral / Chronic Care Management: CRR required this visit?  No   CCM required this visit?  No     Plan:     I have personally reviewed and noted the following in the patient's chart:   Medical and social history Use of alcohol, tobacco or illicit drugs  Current medications and supplements including opioid prescriptions. Patient is not currently taking opioid prescriptions. Functional ability and status Nutritional status Physical activity Advanced directives List of other physicians Hospitalizations, surgeries, and ER visits in previous 12 months Vitals Screenings to include cognitive, depression, and falls Referrals and appointments  In addition, I  have reviewed and discussed with patient certain preventive protocols, quality metrics, and best practice recommendations. A written personalized care plan for preventive services as well as general preventive health recommendations were provided to patient.     Donne Anon, CMA   07/31/2022   After Visit Summary: Sent to mychart  Nurse Notes: None

## 2022-08-02 ENCOUNTER — Encounter (HOSPITAL_COMMUNITY): Payer: Medicare (Managed Care)

## 2022-08-02 ENCOUNTER — Telehealth (HOSPITAL_COMMUNITY): Payer: Self-pay

## 2022-08-05 ENCOUNTER — Encounter (HOSPITAL_COMMUNITY)
Admission: RE | Admit: 2022-08-05 | Discharge: 2022-08-05 | Disposition: A | Discharge status: Home / Self Care | Payer: Medicare (Managed Care) | Source: Ambulatory Visit | Attending: Internal Medicine | Admitting: Internal Medicine

## 2022-08-05 ENCOUNTER — Ambulatory Visit (HOSPITAL_BASED_OUTPATIENT_CLINIC_OR_DEPARTMENT_OTHER): Payer: Medicare (Managed Care) | Admitting: Pulmonary Disease

## 2022-08-05 ENCOUNTER — Encounter (HOSPITAL_BASED_OUTPATIENT_CLINIC_OR_DEPARTMENT_OTHER): Payer: Self-pay | Admitting: Pulmonary Disease

## 2022-08-05 VITALS — BP 110/60 | HR 74 | Resp 20 | Ht 67.0 in | Wt 208.4 lb

## 2022-08-05 DIAGNOSIS — R634 Abnormal weight loss: Secondary | ICD-10-CM | POA: Diagnosis not present

## 2022-08-05 DIAGNOSIS — I214 Non-ST elevation (NSTEMI) myocardial infarction: Secondary | ICD-10-CM | POA: Diagnosis not present

## 2022-08-05 DIAGNOSIS — G4733 Obstructive sleep apnea (adult) (pediatric): Secondary | ICD-10-CM | POA: Diagnosis not present

## 2022-08-05 NOTE — Progress Notes (Signed)
Pulmonary, Critical Care, and Sleep Medicine  Chief Complaint  Patient presents with   New Patient (Initial Visit)    Having  issues with using c-pap, has a lot of noise when using it     Past Surgical History:  He  has a past surgical history that includes Total hip arthroplasty (1994, 1995); Bypass Graft (2001); RIGHT/LEFT HEART CATH AND CORONARY/GRAFT ANGIOGRAPHY (N/A, 09/09/2019); TEE without cardioversion (N/A, 09/13/2019); Cardiac catheterization; Cardioversion (N/A, 11/08/2019); Coronary angioplasty; Coronary artery bypass graft; Carotid endarterectomy (Left, 01/17/2014); MITRAL VALVE REPAIR (N/A, 12/23/2019); TEE without cardioversion (N/A, 12/23/2019); ATRIAL FIBRILLATION ABLATION (N/A, 02/01/2020); TEE without cardioversion (02/01/2020); and LEFT HEART CATH AND CORS/GRAFTS ANGIOGRAPHY (N/A, 06/26/2022).  Past Medical History:  Arthritis, CAD, HFrEF, HTN, HLD, Hypothyroidism, Depression, Mitral regurgitation, Persistent atrial fibrillation, DM type 2  Constitutional:  BP 110/60   Pulse 74   Resp 20   Ht 5\' 7"  (1.702 m)   Wt 208 lb 6.4 oz (94.5 kg)   SpO2 99%   BMI 32.64 kg/m   Brief Summary:  Raymond Christensen is a 82 y.o. male with obstructive sleep apnea.      Subjective:   He had a home sleep study at [facility] in 2016.  This showed severe sleep apnea.  He weight 260 lbs at the time.  He lost about 50 lbs since then by changing his diet and exercising.  He uses CPAP regularly, and has a Resmed 11 auto CPAP.  He doesn't recall the name of his DME.  He has a nasal mask.  His wife says his machine is making more noise and this wakes her up.  He doesn't feel like CPAP helps like it used to.  He doesn't feel like the pressure is too high or too low.  He is not having sinus congestion or dryness.  He goes to sleep between 9 and 11 pm.  He falls asleep in minutes.  He wakes up 2 to 4 times to use the bathroom.  He gets out of bed at 8 am.  He feels okay in the morning.  He denies  morning headache.  He does not use anything to help him fall sleep or stay awake.  He naps for about 30 minutes to an hour in the afternoon, and this helps.    He denies sleep walking, sleep talking, bruxism, or nightmares.  There is no history of restless legs.  He denies sleep hallucinations, sleep paralysis, or cataplexy.  The Epworth score is 6 out of 24.   Physical Exam:   Appearance - well kempt   ENMT - no sinus tenderness, no oral exudate, no LAN, Mallampati 3 airway, no stridor  Respiratory - equal breath sounds bilaterally, no wheezing or rales  CV - s1s2 regular rate and rhythm, no murmurs  Ext - no clubbing, no edema  Skin - no rashes  Psych - normal mood and affect   Sleep Tests:  HST 01/04/15 >> AHI 43.2, SpO2 low 80%  Cardiac Tests:  Echo 06/26/22 >> EF 45 to 50%, s/p MitraClip, mild/mod MR  Social History:  He  reports that he quit smoking about 44 years ago. His smoking use included cigarettes. He has never used smokeless tobacco. He reports current alcohol use of about 4.0 standard drinks of alcohol per week. He reports that he does not use drugs.  Family History:  His family history includes Dementia in his father.    Discussion:  He has a history of severe obstructive sleep  apnea.  He also has history of coronary artery disease, HFrEF, hypertension, atrial fibrillation, and depression.  He has been compliant with CPAP, but doesn't feel like it is helping the same.  His wife reports that his machine makes noises with his breathing when using CPAP.  He has lost a significant amount of weight since his previous sleep study.  It is possible with weight loss he no longer needs therapy, or he might need adjustment to his set up.  Assessment/Plan:   Obstructive sleep apnea. - reviewed his sleep study - discussed how sleep apnea can impact his health - treatment options discussed - given his significant weight loss, will arrange for a home sleep study to  determine current status of his sleep apnea and then determine whether he needs to continue with auto CPAP therapy - he will call with the name/contact information of his DME  Coronary artery disease, Valvular heart disease, Permanent atrial fibrillation. - followed by Dr. [name] and Dr. Loman Brooklyn with cardiology  Time Spent Involved in Patient Care on Day of Examination:  36 minutes  Follow up:   Patient Instructions  Will arrange for a home sleep study.  Don't use your CPAP on the night you do the home sleep study.  Will call to arrange for follow up after sleep study reviewed.   Medication List:   Allergies as of 08/05/2022       Reactions   Sulfa Antibiotics Rash   Questionable, he developed a diffuse rash 2 days after stopping Bactrim        Medication List        Accurate as of August 05, 2022 10:34 AM. If you have any questions, ask your nurse or doctor.          acetaminophen 500 MG tablet Commonly known as: TYLENOL Take 1,000 mg by mouth every 6 (six) hours as needed for moderate pain or headache.   Aspir-Low 81 MG tablet Generic drug: aspirin EC Take 81 mg by mouth daily.   atorvastatin 80 MG tablet Commonly known as: LIPITOR TAKE 1 TABLET DAILY   Eliquis 5 MG Tabs tablet Generic drug: apixaban TAKE 1 TABLET TWICE A DAY   ezetimibe 10 MG tablet Commonly known as: ZETIA Take 1 tablet (10 mg total) by mouth daily.   famotidine 20 MG tablet Commonly known as: PEPCID Take 1 tablet (20 mg total) by mouth daily.   Gemtesa 75 MG Tabs Generic drug: Vibegron Take 1 tablet by mouth every other day.   Iron (Ferrous Sulfate) 325 (65 Fe) MG Tabs Take 1 tablet by mouth daily at 6 (six) AM.   levothyroxine 75 MCG tablet Commonly known as: SYNTHROID TAKE 1 TABLET DAILY   liothyronine 5 MCG tablet Commonly known as: CYTOMEL TAKE 2 TABLETS DAILY   metoprolol succinate 25 MG 24 hr tablet Commonly known as: TOPROL-XL Take 0.5 tablets  (12.5 mg total) by mouth daily.   MULTIVITAMIN ADULT EXTRA C PO Take 1 tablet by mouth daily.   nitroGLYCERIN 0.4 MG SL tablet Commonly known as: NITROSTAT Place 1 tablet (0.4 mg total) under the tongue every 5 (five) minutes as needed for chest pain.   Semaglutide (2 MG/DOSE) 8 MG/3ML Sopn Inject 2 mg as directed once a week. What changed: additional instructions   sertraline 100 MG tablet Commonly known as: ZOLOFT Take 0.5 tablets (50 mg total) by mouth daily. What changed: how much to take   Vitamin D3 250 MCG (10000 UT) capsule Take  10,000 Units by mouth daily.        Signature:  Coralyn Helling, MD [facility] Pulmonary/Critical Care Pager - [phone] 08/05/2022, 10:34 AM

## 2022-08-05 NOTE — Patient Instructions (Signed)
Will arrange for a home sleep study.  Don't use your CPAP on the night you do the home sleep study.  Will call to arrange for follow up after sleep study reviewed.

## 2022-08-07 ENCOUNTER — Encounter (HOSPITAL_COMMUNITY): Admission: RE | Admit: 2022-08-07 | Payer: Medicare (Managed Care) | Source: Ambulatory Visit

## 2022-08-07 ENCOUNTER — Ambulatory Visit: Payer: Medicare (Managed Care)

## 2022-08-07 DIAGNOSIS — I214 Non-ST elevation (NSTEMI) myocardial infarction: Secondary | ICD-10-CM | POA: Diagnosis not present

## 2022-08-09 ENCOUNTER — Encounter (HOSPITAL_COMMUNITY)
Admission: RE | Admit: 2022-08-09 | Discharge: 2022-08-09 | Disposition: A | Discharge status: Home / Self Care | Payer: Medicare (Managed Care) | Source: Ambulatory Visit | Attending: Internal Medicine | Admitting: Internal Medicine

## 2022-08-09 DIAGNOSIS — I214 Non-ST elevation (NSTEMI) myocardial infarction: Secondary | ICD-10-CM

## 2022-08-12 ENCOUNTER — Encounter (HOSPITAL_COMMUNITY)
Admission: RE | Admit: 2022-08-12 | Discharge: 2022-08-12 | Disposition: A | Discharge status: Home / Self Care | Payer: Medicare (Managed Care) | Source: Ambulatory Visit | Attending: Internal Medicine | Admitting: Internal Medicine

## 2022-08-12 DIAGNOSIS — I214 Non-ST elevation (NSTEMI) myocardial infarction: Secondary | ICD-10-CM | POA: Diagnosis not present

## 2022-08-13 NOTE — Progress Notes (Signed)
Cardiac Individual Treatment Plan  Patient Details  Name: Raymond Christensen MRN: [id number] Date of Birth: [birth year] Referring Provider:   Flowsheet Row INTENSIVE CARDIAC REHAB ORIENT from 07/09/2022 in [facility]  Referring Provider [name], MD       Initial Encounter Date:  Flowsheet Row INTENSIVE CARDIAC REHAB ORIENT from 07/09/2022 in [facility]  Date 07/09/22       Visit Diagnosis: 06/25/22 NSTEMI (non-ST elevated myocardial infarction) [facility])  Patient's Home Medications on Admission:  Current Outpatient Medications:    acetaminophen (TYLENOL) 500 MG tablet, Take 1,000 mg by mouth every 6 (six) hours as needed for moderate pain or headache., Disp: , Rfl:    aspirin EC (ASPIR-LOW) 81 MG tablet, Take 81 mg by mouth daily., Disp: , Rfl:    atorvastatin (LIPITOR) 80 MG tablet, TAKE 1 TABLET DAILY, Disp: 90 tablet, Rfl: 2   Cholecalciferol (VITAMIN D3) 250 MCG (10000 UT) capsule, Take 10,000 Units by mouth daily. , Disp: , Rfl:    ELIQUIS 5 MG TABS tablet, TAKE 1 TABLET TWICE A DAY, Disp: 180 tablet, Rfl: 3   ezetimibe (ZETIA) 10 MG tablet, Take 1 tablet (10 mg total) by mouth daily., Disp: 90 tablet, Rfl: 0   famotidine (PEPCID) 20 MG tablet, Take 1 tablet (20 mg total) by mouth daily., Disp: 30 tablet, Rfl: 3   Iron, Ferrous Sulfate, 325 (65 Fe) MG TABS, Take 1 tablet by mouth daily at 6 (six) AM., Disp: , Rfl:    levothyroxine (SYNTHROID) 75 MCG tablet, TAKE 1 TABLET DAILY, Disp: 90 tablet, Rfl: 1   liothyronine (CYTOMEL) 5 MCG tablet, TAKE 2 TABLETS DAILY (Patient taking differently: Take 10 mcg by mouth daily.), Disp: 180 tablet, Rfl: 3   metoprolol succinate (TOPROL-XL) 25 MG 24 hr tablet, Take 0.5 tablets (12.5 mg total) by mouth daily., Disp: 45 tablet, Rfl: 3   Multiple Vitamins-Minerals (MULTIVITAMIN ADULT EXTRA C PO), Take 1 tablet by mouth daily.  , Disp: , Rfl:    nitroGLYCERIN (NITROSTAT) 0.4 MG SL tablet, Place 1 tablet (0.4 mg total) under the tongue every 5 (five) minutes as needed for chest pain. (Patient not taking: Reported on 08/05/2022), Disp: 30 tablet, Rfl: 12   Semaglutide, 2 MG/DOSE, 8 MG/3ML SOPN, Inject 2 mg as directed once a week. (Patient taking differently: Inject 2 mg as directed once a week. Sunday), Disp: 9 mL, Rfl: 2   sertraline (ZOLOFT) 100 MG tablet, Take 0.5 tablets (50 mg total) by mouth daily. (Patient taking differently: Take 100 mg by mouth daily.), Disp: 90 tablet, Rfl: 0   Vibegron (GEMTESA) 75 MG TABS, Take 1 tablet by mouth every other day., Disp: , Rfl:   Past Medical History: Past Medical History:  Diagnosis Date   Arthritis    CAD (coronary artery disease)    Carotid artery disease (HCC)    s/p left CEA 01/17/14   Chronic systolic CHF (congestive heart failure) (HCC)    Concussion Summer 2012   Essential hypertension 02/26/2017   Heart disease    Hyperlipidemia    Hypothyroidism 10/15/2017   Major depressive disorder    Mitral valve regurgitation    Myocardial infarction (HCC) 1996, 2021   Obstructive sleep apnea on CPAP    Persistent atrial fibrillation (HCC)    Poor flexibility of tendon 12/01/2018   S/P mitral valve clip implantation 12/23/2019   s/p TEER with MitraClip with  one XTW by Dr. Excell Seltzer   Subungual hematoma of digit of hand 12/01/2018   Subungual hematoma of right foot 12/01/2018   Type 2 diabetes mellitus without complication, without long-term current use of insulin (HCC) 02/26/2017    Tobacco Use: Social History   Tobacco Use  Smoking Status Former   Current packs/day: 0.00   Types: Cigarettes   Quit date: 1980   Years since quitting: 44.6  Smokeless Tobacco Never    Labs: Review Flowsheet  More data exists      Latest Ref Rng & Units 08/31/2021 09/30/2021 10/08/2021 05/03/2022 06/26/2022  Labs for ITP Cardiac and Pulmonary Rehab  Cholestrol 0 - 200 mg/dL 119  -  147  - 85   LDL (calc) 0 - 99 mg/dL - - 45  - 34   Direct LDL mg/dL 82.9  - - - -  HDL-C >56 mg/dL 21.30  - 86.57  - 40   Trlycerides <150 mg/dL 846.9  - 629.5  - 53   Hemoglobin A1c 4.8 - 5.6 % 6.4  - - 5.8  5.4   TCO2 22 - 32 mmol/L - 26  - - -    Details            Capillary Blood Glucose: Lab Results  Component Value Date   GLUCAP 82 07/31/2022   GLUCAP 93 07/31/2022   GLUCAP 145 (H) 07/29/2022   GLUCAP 84 07/26/2022   GLUCAP 101 (H) 07/26/2022     Exercise Target Goals: Exercise Program Goal: Individual exercise prescription set using results from initial 6 min walk test and THRR while considering  patient's activity barriers and safety.   Exercise Prescription Goal: Initial exercise prescription builds to 30-45 minutes a day of aerobic activity, 2-3 days per week.  Home exercise guidelines will be given to patient during program as part of exercise prescription that the participant will acknowledge.  Activity Barriers & Risk Stratification:  Activity Barriers & Cardiac Risk Stratification - 07/09/22 1251       Activity Barriers & Cardiac Risk Stratification   Activity Barriers Balance Concerns;History of Falls;Decreased Ventricular Function;Left Hip Replacement;Right Hip Replacement;Deconditioning;Muscular Weakness    Cardiac Risk Stratification High             6 Minute Walk:  6 Minute Walk     Row Name 07/09/22 1057         6 Minute Walk   Phase Initial  Nustep used due to low blood pressure     Distance 1837 feet  Nustep used due to low BP     Walk Time 6 minutes     # of Rest Breaks 0     MPH 3.5     METS 2.9     RPE 11     Perceived Dyspnea  0     VO2 Peak 10.1     Symptoms No     Resting HR 72 bpm     Resting BP 104/61     Resting Oxygen Saturation  97 %     Exercise Oxygen Saturation  during 6 min walk 97 %     Max Ex. HR 105 bpm     Max Ex. BP 114/56     2 Minute Post BP 97/56              6 Minute Walk     Row Name  07/09/22 1057         6 Minute Walk   Phase Initial  Nustep used due to low blood pressure     Distance 1837 feet  Nustep used due to low BP     Walk Time 6 minutes     # of Rest Breaks 0     MPH 3.5     METS 2.9     RPE 11     Perceived Dyspnea  0     VO2 Peak 10.1     Symptoms No     Resting HR 72 bpm     Resting BP 104/61     Resting Oxygen Saturation  97 %     Exercise Oxygen Saturation  during 6 min walk 97 %     Max Ex. HR 105 bpm     Max Ex. BP 114/56     2 Minute Post BP 97/56              Oxygen Initial Assessment:   Oxygen Re-Evaluation:   Oxygen Discharge (Final Oxygen Re-Evaluation):   Initial Exercise Prescription:  Initial Exercise Prescription - 07/09/22 1300       Date of Initial Exercise RX and Referring Provider   Date 07/09/22    Referring Provider [name], MD    Expected Discharge Date 09/20/22      NuStep   Level 1    SPM 80    Minutes 25    METs 2.9      Prescription Details   Frequency (times per week) 3    Duration Progress to 30 minutes of continuous aerobic without signs/symptoms of physical distress      Intensity   THRR 40-80% of Max Heartrate 56-111    Ratings of Perceived Exertion 11-13    Perceived Dyspnea 0-4      Progression   Progression Continue to progress workloads to maintain intensity without signs/symptoms of physical distress.      Resistance Training   Training Prescription Yes    Weight 3 lbs    Reps 10-15             Perform Capillary Blood Glucose checks as needed.  Exercise Prescription Changes:   Exercise Prescription Changes     Row Name 07/26/22 1400 08/09/22 1400           Response to Exercise   Blood Pressure (Admit) 118/52 118/78      Blood Pressure (Exercise) 120/60 124/68      Blood Pressure (Exit) 110/60 106/70      Heart Rate (Admit) 73 bpm 68 bpm      Heart Rate (Exercise) 89 bpm 97 bpm      Heart Rate (Exit) 77 bpm 75 bpm      Rating of Perceived  Exertion (Exercise) 10 11      Symptoms None None      Comments Pt's first day in the CRP2 program Reviewed METs      Duration Progress to 30 minutes of  aerobic without signs/symptoms of physical distress Continue with 30 min of aerobic exercise without signs/symptoms of physical distress.      Intensity THRR unchanged THRR unchanged        Progression   Progression Continue to progress workloads to maintain intensity without signs/symptoms of physical distress. Continue to progress workloads to maintain intensity without signs/symptoms of physical distress.      Average METs 2.2 2.5        Resistance Training   Training Prescription Yes Yes      Weight 3 lbs 3 lbs  Reps 10-15 10-15      Time 10 Minutes 10 Minutes        Interval Training   Interval Training No No        NuStep   Level 1 2      SPM 103 116      Minutes 25 30      METs 2.2 2.5               Exercise Prescription Changes     Row Name 07/26/22 1400 08/09/22 1400           Response to Exercise   Blood Pressure (Admit) 118/52 118/78      Blood Pressure (Exercise) 120/60 124/68      Blood Pressure (Exit) 110/60 106/70      Heart Rate (Admit) 73 bpm 68 bpm      Heart Rate (Exercise) 89 bpm 97 bpm      Heart Rate (Exit) 77 bpm 75 bpm      Rating of Perceived Exertion (Exercise) 10 11      Symptoms None None      Comments Pt's first day in the CRP2 program Reviewed METs      Duration Progress to 30 minutes of  aerobic without signs/symptoms of physical distress Continue with 30 min of aerobic exercise without signs/symptoms of physical distress.      Intensity THRR unchanged THRR unchanged        Progression   Progression Continue to progress workloads to maintain intensity without signs/symptoms of physical distress. Continue to progress workloads to maintain intensity without signs/symptoms of physical distress.      Average METs 2.2 2.5        Resistance Training   Training Prescription Yes Yes       Weight 3 lbs 3 lbs      Reps 10-15 10-15      Time 10 Minutes 10 Minutes        Interval Training   Interval Training No No        NuStep   Level 1 2      SPM 103 116      Minutes 25 30      METs 2.2 2.5               Exercise Comments:   Exercise Comments     Row Name 07/26/22 1416 08/09/22 1424 08/09/22 1427       Exercise Comments Pt's first day in the CRP2 program. Pt exercise with no complaints ansd is off to a good start. Reviewed METs with patient. Pt is making good progress on MET level. Will try patient on recumbent bike next session for 15 minutes. Reviewed METs with patient today, pt is making good progress.              Exercise Comments     Row Name 07/26/22 1416 08/09/22 1424 08/09/22 1427       Exercise Comments Pt's first day in the CRP2 program. Pt exercise with no complaints ansd is off to a good start. Reviewed METs with patient. Pt is making good progress on MET level. Will try patient on recumbent bike next session for 15 minutes. Reviewed METs with patient today, pt is making good progress.              Exercise Goals and Review:   Exercise Goals     Row Name 07/09/22 [phone]  Exercise Goals   Increase Physical Activity Yes       Intervention Provide advice, education, support and counseling about physical activity/exercise needs.;Develop an individualized exercise prescription for aerobic and resistive training based on initial evaluation findings, risk stratification, comorbidities and participant's personal goals.       Expected Outcomes Short Term: Attend rehab on a regular basis to increase amount of physical activity.;Long Term: Add in home exercise to make exercise part of routine and to increase amount of physical activity.;Long Term: Exercising regularly at least 3-5 days a week.       Increase Strength and Stamina Yes       Intervention Provide advice, education, support and counseling about physical  activity/exercise needs.;Develop an individualized exercise prescription for aerobic and resistive training based on initial evaluation findings, risk stratification, comorbidities and participant's personal goals.       Expected Outcomes Short Term: Increase workloads from initial exercise prescription for resistance, speed, and METs.;Short Term: Perform resistance training exercises routinely during rehab and add in resistance training at home;Long Term: Improve cardiorespiratory fitness, muscular endurance and strength as measured by increased METs and functional capacity ( )       Able to understand and use rate of perceived exertion (RPE) scale Yes       Intervention Provide education and explanation on how to use RPE scale       Expected Outcomes Short Term: Able to use RPE daily in rehab to express subjective intensity level;Long Term:  Able to use RPE to guide intensity level when exercising independently       Knowledge and understanding of Target Heart Rate Range (THRR) Yes       Intervention Provide education and explanation of THRR including how the numbers were predicted and where they are located for reference       Expected Outcomes Short Term: Able to state/look up THRR;Short Term: Able to use daily as guideline for intensity in rehab;Long Term: Able to use THRR to govern intensity when exercising independently       Understanding of Exercise Prescription Yes       Intervention Provide education, explanation, and written materials on patient's individual exercise prescription       Expected Outcomes Short Term: Able to explain program exercise prescription;Long Term: Able to explain home exercise prescription to exercise independently                Exercise Goals     Row Name 07/09/22 1252             Exercise Goals   Increase Physical Activity Yes       Intervention Provide advice, education, support and counseling about physical activity/exercise needs.;Develop an  individualized exercise prescription for aerobic and resistive training based on initial evaluation findings, risk stratification, comorbidities and participant's personal goals.       Expected Outcomes Short Term: Attend rehab on a regular basis to increase amount of physical activity.;Long Term: Add in home exercise to make exercise part of routine and to increase amount of physical activity.;Long Term: Exercising regularly at least 3-5 days a week.       Increase Strength and Stamina Yes       Intervention Provide advice, education, support and counseling about physical activity/exercise needs.;Develop an individualized exercise prescription for aerobic and resistive training based on initial evaluation findings, risk stratification, comorbidities and participant's personal goals.       Expected Outcomes Short Term: Increase workloads from initial exercise prescription  for resistance, speed, and METs.;Short Term: Perform resistance training exercises routinely during rehab and add in resistance training at home;Long Term: Improve cardiorespiratory fitness, muscular endurance and strength as measured by increased METs and functional capacity ( )       Able to understand and use rate of perceived exertion (RPE) scale Yes       Intervention Provide education and explanation on how to use RPE scale       Expected Outcomes Short Term: Able to use RPE daily in rehab to express subjective intensity level;Long Term:  Able to use RPE to guide intensity level when exercising independently       Knowledge and understanding of Target Heart Rate Range (THRR) Yes       Intervention Provide education and explanation of THRR including how the numbers were predicted and where they are located for reference       Expected Outcomes Short Term: Able to state/look up THRR;Short Term: Able to use daily as guideline for intensity in rehab;Long Term: Able to use THRR to govern intensity when exercising independently        Understanding of Exercise Prescription Yes       Intervention Provide education, explanation, and written materials on patient's individual exercise prescription       Expected Outcomes Short Term: Able to explain program exercise prescription;Long Term: Able to explain home exercise prescription to exercise independently                Exercise Goals Re-Evaluation :  Exercise Goals Re-Evaluation     Row Name 07/26/22 1412             Exercise Goal Re-Evaluation   Exercise Goals Review Increase Physical Activity;Understanding of Exercise Prescription;Increase Strength and Stamina;Knowledge and understanding of Target Heart Rate Range (THRR);Able to understand and use rate of perceived exertion (RPE) scale       Comments Pt's first day in the CRP2 program. Pt understands the exercise Rx, THRR and RPE scale.       Expected Outcomes Will continue to montior patient and progress exercise workloads as tolerated.                Exercise Goals Re-Evaluation     Row Name 07/26/22 1412             Exercise Goal Re-Evaluation   Exercise Goals Review Increase Physical Activity;Understanding of Exercise Prescription;Increase Strength and Stamina;Knowledge and understanding of Target Heart Rate Range (THRR);Able to understand and use rate of perceived exertion (RPE) scale       Comments Pt's first day in the CRP2 program. Pt understands the exercise Rx, THRR and RPE scale.       Expected Outcomes Will continue to montior patient and progress exercise workloads as tolerated.                Discharge Exercise Prescription (Final Exercise Prescription Changes):  Exercise Prescription Changes - 08/09/22 1400       Response to Exercise   Blood Pressure (Admit) 118/78    Blood Pressure (Exercise) 124/68    Blood Pressure (Exit) 106/70    Heart Rate (Admit) 68 bpm    Heart Rate (Exercise) 97 bpm    Heart Rate (Exit) 75 bpm    Rating of Perceived Exertion (Exercise) 11     Symptoms None    Comments Reviewed METs    Duration Continue with 30 min of aerobic exercise without signs/symptoms of physical distress.    Intensity  THRR unchanged      Progression   Progression Continue to progress workloads to maintain intensity without signs/symptoms of physical distress.    Average METs 2.5      Resistance Training   Training Prescription Yes    Weight 3 lbs    Reps 10-15    Time 10 Minutes      Interval Training   Interval Training No      NuStep   Level 2    SPM 116    Minutes 30    METs 2.5             Nutrition:  Target Goals: Understanding of nutrition guidelines, daily intake of sodium 1500mg , cholesterol 200mg , calories 30% from fat and 7% or less from saturated fats, daily to have 5 or more servings of fruits and vegetables.  Biometrics:  Pre Biometrics - 07/09/22 1253       Pre Biometrics   Waist Circumference 44.5 inches    Hip Circumference 47 inches    Waist to Hip Ratio 0.95 %    Triceps Skinfold 14 mm    % Body Fat 32 %    Grip Strength 31 kg    Flexibility 0 in   Cannot reach   Single Leg Stand 1 seconds              Nutrition Therapy Plan and Nutrition Goals:  Nutrition Therapy & Goals - 07/26/22 1402       Nutrition Therapy   Diet Heart Healthy Diet    Drug/Food Interactions Statins/Certain Fruits      Personal Nutrition Goals   Nutrition Goal Patient to identify strategies for reducing cardiovascular risk by attending the [employer] education and nutrition series weekly    Personal Goal #2 Patient to improve diet quality by using the plate method as a guide for meal planning to include lean protein/plant protein, fruits, vegetables, whole grains, nonfat dairy as part of a well-balanced diet.    Comments Goals in action. Bronislaus reports no nutrition questions/concerns at this time; he has previously completed cardiac rehab in 12/2019 and 06/2021. He reports eating a wide variety of foods and eating three meals  daily. His wife is a good support. He is taking Ozempic for weight loss and is down ~40# per documentation on 08/31/2021. Patient will benefit from participation in intensive cardiac rehab for nutrition, exercise, and lifestyle modification.      Intervention Plan   Intervention Prescribe, educate and counsel regarding individualized specific dietary modifications aiming towards targeted core components such as weight, hypertension, lipid management, diabetes, heart failure and other comorbidities.;Nutrition handout(s) given to patient.    Expected Outcomes Short Term Goal: Understand basic principles of dietary content, such as calories, fat, sodium, cholesterol and nutrients.;Long Term Goal: Adherence to prescribed nutrition plan.             Nutrition Assessments:  MEDIFICTS Score Key: ?70 Need to make dietary changes  40-70 Heart Healthy Diet ? 40 Therapeutic Level Cholesterol Diet   Flowsheet Row CARDIAC REHAB PHASE II EXERCISE from 06/23/2020 in [facility]  Picture Your Plate Total Score on Discharge 64      Picture Your Plate Scores: <16 Unhealthy dietary pattern with much room for improvement. 41-50 Dietary pattern unlikely to meet recommendations for good health and room for improvement. 51-60 More healthful dietary pattern, with some room for improvement.  >60 Healthy dietary pattern, although there may be some  specific behaviors that could be improved.    Nutrition Goals Re-Evaluation:  Nutrition Goals Re-Evaluation     Row Name 07/26/22 1402             Goals   Current Weight 209 lb 7 oz (95 kg)       Comment lipoproteinA WNL, A1c is well-controlled at 5.4, HDL 40, potassium 3.3       Expected Outcome Goals in action. Kyla reports no nutrition questions/concerns at this time; he has previously completed cardiac rehab in 12/2019 and 06/2021. He reports eating a wide variety of foods and eating three  meals daily. His wife is a good support. He is taking Ozempic for weight loss and is down ~40# per documentation on 08/31/2021. Patient will benefit from participation in intensive cardiac rehab for nutrition, exercise, and lifestyle modification.                Nutrition Goals Re-Evaluation:  Nutrition Goals Re-Evaluation     Row Name 07/26/22 1402             Goals   Current Weight 209 lb 7 oz (95 kg)       Comment lipoproteinA WNL, A1c is well-controlled at 5.4, HDL 40, potassium 3.3       Expected Outcome Goals in action. Sanchez reports no nutrition questions/concerns at this time; he has previously completed cardiac rehab in 12/2019 and 06/2021. He reports eating a wide variety of foods and eating three meals daily. His wife is a good support. He is taking Ozempic for weight loss and is down ~40# per documentation on 08/31/2021. Patient will benefit from participation in intensive cardiac rehab for nutrition, exercise, and lifestyle modification.                Nutrition Goals Re-Evaluation     Row Name 07/26/22 1402             Goals   Current Weight 209 lb 7 oz (95 kg)       Comment lipoproteinA WNL, A1c is well-controlled at 5.4, HDL 40, potassium 3.3       Expected Outcome Goals in action. Christop reports no nutrition questions/concerns at this time; he has previously completed cardiac rehab in 12/2019 and 06/2021. He reports eating a wide variety of foods and eating three meals daily. His wife is a good support. He is taking Ozempic for weight loss and is down ~40# per documentation on 08/31/2021. Patient will benefit from participation in intensive cardiac rehab for nutrition, exercise, and lifestyle modification.                Nutrition Goals Discharge (Final Nutrition Goals Re-Evaluation):  Nutrition Goals Re-Evaluation - 07/26/22 1402       Goals   Current Weight 209 lb 7 oz (95 kg)    Comment lipoproteinA WNL, A1c is well-controlled at 5.4, HDL 40,  potassium 3.3    Expected Outcome Goals in action. Raiyan reports no nutrition questions/concerns at this time; he has previously completed cardiac rehab in 12/2019 and 06/2021. He reports eating a wide variety of foods and eating three meals daily. His wife is a good support. He is taking Ozempic for weight loss and is down ~40# per documentation on 08/31/2021. Patient will benefit from participation in intensive cardiac rehab for nutrition, exercise, and lifestyle modification.             Psychosocial: Target Goals: Acknowledge presence or absence of significant depression and/or stress, maximize  coping skills, provide positive support system. Participant is able to verbalize types and ability to use techniques and skills needed for reducing stress and depression.  Initial Review & Psychosocial Screening:  Initial Psych Review & Screening - 07/09/22 1008       Initial Review   Current issues with None Identified;History of Depression      Family Dynamics   Good Support System? Yes   Has spouse and younger son for support   Comments Ezariah denies being depressed. Kaamil takes Zoloft. Pt feels that his medication is working well for him.      Barriers   Psychosocial barriers to participate in program There are no identifiable barriers or psychosocial needs.      Screening Interventions   Interventions Encouraged to exercise    Expected Outcomes Long Term Goal: Stressors or current issues are controlled or eliminated.             Quality of Life Scores:  Quality of Life - 07/09/22 1243       Quality of Life   Select Quality of Life      Quality of Life Scores   Health/Function Pre 19.23 %    Socioeconomic Pre 27.5 %    Psych/Spiritual Pre 22.86 %    Family Pre 27.6 %    GLOBAL Pre 22.91 %            Scores of 19 and below usually indicate a poorer quality of life in these areas.  A difference of  2-3 points is a clinically meaningful difference.  A difference  of 2-3 points in the total score of the Quality of Life Index has been associated with significant improvement in overall quality of life, self-image, physical symptoms, and general health in studies assessing change in quality of life.  PHQ-9: Review Flowsheet  More data exists      07/31/2022 07/09/2022 05/29/2022 02/11/2022 07/27/2021  Depression screen PHQ 2/9  Decreased Interest 0 0 0 0 0  Down, Depressed, Hopeless 0 0 0 2 1  PHQ - 2 Score 0 0 0 2 1  Altered sleeping - 0 0 2 -  Tired, decreased energy - 2 0 3 -  Change in appetite - 1 0 1 -  Feeling bad or failure about yourself  - 1 0 0 -  Trouble concentrating - 0 0 0 -  Moving slowly or fidgety/restless - 0 0 0 -  Suicidal thoughts - 0 0 0 -  PHQ-9 Score - 4 0 8 -  Difficult doing work/chores - Not difficult at all Not difficult at all Not difficult at all -    Details           Interpretation of Total Score  Total Score Depression Severity:  1-4 = Minimal depression, 5-9 = Mild depression, 10-14 = Moderate depression, 15-19 = Moderately severe depression, 20-27 = Severe depression   Psychosocial Evaluation and Intervention:   Psychosocial Re-Evaluation:  Psychosocial Re-Evaluation     Row Name 08/13/22 1546             Psychosocial Re-Evaluation   Current issues with None Identified       Interventions Encouraged to attend Cardiac Rehabilitation for the exercise       Continue Psychosocial Services  No Follow up required                Psychosocial Discharge (Final Psychosocial Re-Evaluation):  Psychosocial Re-Evaluation - 08/13/22 1546  Psychosocial Re-Evaluation   Current issues with None Identified    Interventions Encouraged to attend Cardiac Rehabilitation for the exercise    Continue Psychosocial Services  No Follow up required             Vocational Rehabilitation: Provide vocational rehab assistance to qualifying candidates.   Vocational Rehab Evaluation & Intervention:   Vocational Rehab - 07/09/22 1010       Initial Vocational Rehab Evaluation & Intervention   Assessment shows need for Vocational Rehabilitation No   Pt is retired.            Education: Education Goals: Education classes will be provided on a weekly basis, covering required topics. Participant will state understanding/return demonstration of topics presented.    Education     Row Name 07/26/22 1200     Education   Cardiac Education Topics Pritikin   Psychologist, forensic General Education   General Education Hypertension and Heart Disease   Instruction Review Code 1- Verbalizes Understanding   Class Start Time 1141   Class Stop Time 1217   Class Time Calculation (min) 36 min    Row Name 07/29/22 1400     Education   Cardiac Education Topics Pritikin   Psychologist, forensic Exercise Education   Exercise Education Biomechanial Limitations   Instruction Review Code 1- Verbalizes Understanding   Class Start Time 1150   Class Stop Time 1230   Class Time Calculation (min) 40 min    Row Name 07/31/22 1300     Education   Cardiac Education Topics Pritikin   Customer service manager   Weekly Topic Comforting Weekend Breakfasts   Instruction Review Code 1- Verbalizes Understanding   Class Start Time 1143   Class Stop Time 1216   Class Time Calculation (min) 33 min    Row Name 08/05/22 1600     Education   Cardiac Education Topics Pritikin   Nurse, children's   Educator Dietitian   Select Nutrition   Nutrition Facts on Fat   Instruction Review Code 1- Verbalizes Understanding   Class Start Time 1150   Class Stop Time 1222   Class Time Calculation (min) 32 min    Row Name 08/07/22 1300     Education   Cardiac Education Topics Pritikin   Orthoptist    Educator Dietitian   Weekly Topic Fast Evening Meals   Instruction Review Code 1- Verbalizes Understanding   Class Start Time 1138   Class Stop Time 1216   Class Time Calculation (min) 38 min    Row Name 08/09/22 1300     Education   Cardiac Education Topics Pritikin   Licensed conveyancer Nutrition   Nutrition Vitamins and Minerals   Instruction Review Code 1- Verbalizes Understanding   Class Start Time 1145   Class Stop Time 1230   Class Time Calculation (min) 45 min    Row Name 08/12/22 1300     Education   Cardiac Education Topics Pritikin   Geographical information systems officer Psychosocial   Psychosocial Workshop Healthy  Sleep for a Healthy Heart   Instruction Review Code 1- Verbalizes Understanding   Class Start Time 1148   Class Stop Time 1235   Class Time Calculation (min) 47 min            Education     Row Name 07/26/22 1200     Education   Cardiac Education Topics Pritikin   Psychologist, forensic General Education   General Education Hypertension and Heart Disease   Instruction Review Code 1- Verbalizes Understanding   Class Start Time 1141   Class Stop Time 1217   Class Time Calculation (min) 36 min    Row Name 07/29/22 1400     Education   Cardiac Education Topics Pritikin   Psychologist, forensic Exercise Education   Exercise Education Biomechanial Limitations   Instruction Review Code 1- Verbalizes Understanding   Class Start Time 1150   Class Stop Time 1230   Class Time Calculation (min) 40 min    Row Name 07/31/22 1300     Education   Cardiac Education Topics Pritikin   Customer service manager   Weekly Topic Comforting Weekend Breakfasts   Instruction Review Code 1- Verbalizes Understanding    Class Start Time 1143   Class Stop Time 1216   Class Time Calculation (min) 33 min    Row Name 08/05/22 1600     Education   Cardiac Education Topics Pritikin   Nurse, children's   Educator Dietitian   Select Nutrition   Nutrition Facts on Fat   Instruction Review Code 1- Verbalizes Understanding   Class Start Time 1150   Class Stop Time 1222   Class Time Calculation (min) 32 min    Row Name 08/07/22 1300     Education   Cardiac Education Topics Pritikin   Orthoptist   Educator Dietitian   Weekly Topic Fast Evening Meals   Instruction Review Code 1- Verbalizes Understanding   Class Start Time 1138   Class Stop Time 1216   Class Time Calculation (min) 38 min    Row Name 08/09/22 1300     Education   Cardiac Education Topics Pritikin   Licensed conveyancer Nutrition   Nutrition Vitamins and Minerals   Instruction Review Code 1- Verbalizes Understanding   Class Start Time 1145   Class Stop Time 1230   Class Time Calculation (min) 45 min    Row Name 08/12/22 1300     Education   Cardiac Education Topics Pritikin   Geographical information systems officer Psychosocial   Psychosocial Workshop Healthy Sleep for a Healthy Heart   Instruction Review Code 1- Verbalizes Understanding   Class Start Time 1148   Class Stop Time 1235   Class Time Calculation (min) 47 min            Education     Row Name 07/26/22 1200     Education   Cardiac Education Topics Pritikin   Hospital doctor Education  General Education Hypertension and Heart Disease   Instruction Review Code 1- Verbalizes Understanding   Class Start Time 1141   Class Stop Time 1217   Class Time Calculation (min) 36 min    Row Name 07/29/22 1400     Education   Cardiac Education Topics Pritikin    Select Core Videos     Core Videos   Educator Exercise Physiologist   Select Exercise Education   Exercise Education Biomechanial Limitations   Instruction Review Code 1- Verbalizes Understanding   Class Start Time 1150   Class Stop Time 1230   Class Time Calculation (min) 40 min    Row Name 07/31/22 1300     Education   Cardiac Education Topics Pritikin   Customer service manager   Weekly Topic Comforting Weekend Breakfasts   Instruction Review Code 1- Verbalizes Understanding   Class Start Time 1143   Class Stop Time 1216   Class Time Calculation (min) 33 min    Row Name 08/05/22 1600     Education   Cardiac Education Topics Pritikin   Nurse, children's   Educator Dietitian   Select Nutrition   Nutrition Facts on Fat   Instruction Review Code 1- Verbalizes Understanding   Class Start Time 1150   Class Stop Time 1222   Class Time Calculation (min) 32 min    Row Name 08/07/22 1300     Education   Cardiac Education Topics Pritikin   Orthoptist   Educator Dietitian   Weekly Topic Fast Evening Meals   Instruction Review Code 1- Verbalizes Understanding   Class Start Time 1138   Class Stop Time 1216   Class Time Calculation (min) 38 min    Row Name 08/09/22 1300     Education   Cardiac Education Topics Pritikin   Licensed conveyancer Nutrition   Nutrition Vitamins and Minerals   Instruction Review Code 1- Verbalizes Understanding   Class Start Time 1145   Class Stop Time 1230   Class Time Calculation (min) 45 min    Row Name 08/12/22 1300     Education   Cardiac Education Topics Pritikin   Geographical information systems officer Psychosocial   Psychosocial Workshop Healthy Sleep for a Healthy Heart   Instruction Review Code 1- Verbalizes Understanding   Class Start Time 1148    Class Stop Time 1235   Class Time Calculation (min) 47 min            Education     Row Name 07/26/22 1200     Education   Cardiac Education Topics Pritikin   Psychologist, forensic General Education   General Education Hypertension and Heart Disease   Instruction Review Code 1- Verbalizes Understanding   Class Start Time 1141   Class Stop Time 1217   Class Time Calculation (min) 36 min    Row Name 07/29/22 1400     Education   Cardiac Education Topics Pritikin   Psychologist, forensic Exercise Education   Exercise Education Biomechanial Limitations   Instruction Review Code 1-  Verbalizes Understanding   Class Start Time 1150   Class Stop Time 1230   Class Time Calculation (min) 40 min    Row Name 07/31/22 1300     Education   Cardiac Education Topics Pritikin   Customer service manager   Weekly Topic Comforting Weekend Breakfasts   Instruction Review Code 1- Verbalizes Understanding   Class Start Time 1143   Class Stop Time 1216   Class Time Calculation (min) 33 min    Row Name 08/05/22 1600     Education   Cardiac Education Topics Pritikin   Select Core Videos     Core Videos   Educator Dietitian   Select Nutrition   Nutrition Facts on Fat   Instruction Review Code 1- Verbalizes Understanding   Class Start Time 1150   Class Stop Time 1222   Class Time Calculation (min) 32 min    Row Name 08/07/22 1300     Education   Cardiac Education Topics Pritikin   Orthoptist   Educator Dietitian   Weekly Topic Fast Evening Meals   Instruction Review Code 1- Verbalizes Understanding   Class Start Time 1138   Class Stop Time 1216   Class Time Calculation (min) 38 min    Row Name 08/09/22 1300     Education   Cardiac Education Topics Pritikin   Architectural technologist Nutrition   Nutrition Vitamins and Minerals   Instruction Review Code 1- Verbalizes Understanding   Class Start Time 1145   Class Stop Time 1230   Class Time Calculation (min) 45 min    Row Name 08/12/22 1300     Education   Cardiac Education Topics Pritikin   Geographical information systems officer Psychosocial   Psychosocial Workshop Healthy Sleep for a Healthy Heart   Instruction Review Code 1- Verbalizes Understanding   Class Start Time 1148   Class Stop Time 1235   Class Time Calculation (min) 47 min            Education     Row Name 07/26/22 1200     Education   Cardiac Education Topics Pritikin   Psychologist, forensic General Education   General Education Hypertension and Heart Disease   Instruction Review Code 1- Verbalizes Understanding   Class Start Time 1141   Class Stop Time 1217   Class Time Calculation (min) 36 min    Row Name 07/29/22 1400     Education   Cardiac Education Topics Pritikin   Psychologist, forensic Exercise Education   Exercise Education Biomechanial Limitations   Instruction Review Code 1- Verbalizes Understanding   Class Start Time 1150   Class Stop Time 1230   Class Time Calculation (min) 40 min    Row Name 07/31/22 1300     Education   Cardiac Education Topics Pritikin   Customer service manager   Weekly Topic Comforting Weekend Breakfasts   Instruction Review Code 1- Verbalizes Understanding   Class Start Time 1143   Class Stop Time 1216   Class Time  Calculation (min) 33 min    Row Name 08/05/22 1600     Education   Cardiac Education Topics Pritikin   Select Core Videos     Core Videos   Educator Dietitian   Select Nutrition   Nutrition Facts on Fat   Instruction Review Code 1-  Verbalizes Understanding   Class Start Time 1150   Class Stop Time 1222   Class Time Calculation (min) 32 min    Row Name 08/07/22 1300     Education   Cardiac Education Topics Pritikin   Orthoptist   Educator Dietitian   Weekly Topic Fast Evening Meals   Instruction Review Code 1- Verbalizes Understanding   Class Start Time 1138   Class Stop Time 1216   Class Time Calculation (min) 38 min    Row Name 08/09/22 1300     Education   Cardiac Education Topics Pritikin   Licensed conveyancer Nutrition   Nutrition Vitamins and Minerals   Instruction Review Code 1- Verbalizes Understanding   Class Start Time 1145   Class Stop Time 1230   Class Time Calculation (min) 45 min    Row Name 08/12/22 1300     Education   Cardiac Education Topics Pritikin   Geographical information systems officer Psychosocial   Psychosocial Workshop Healthy Sleep for a Healthy Heart   Instruction Review Code 1- Verbalizes Understanding   Class Start Time 1148   Class Stop Time 1235   Class Time Calculation (min) 47 min            Education     Row Name 07/26/22 1200     Education   Cardiac Education Topics Pritikin   Psychologist, forensic General Education   General Education Hypertension and Heart Disease   Instruction Review Code 1- Verbalizes Understanding   Class Start Time 1141   Class Stop Time 1217   Class Time Calculation (min) 36 min    Row Name 07/29/22 1400     Education   Cardiac Education Topics Pritikin   Psychologist, forensic Exercise Education   Exercise Education Biomechanial Limitations   Instruction Review Code 1- Verbalizes Understanding   Class Start Time 1150   Class Stop Time 1230   Class Time Calculation (min) 40 min    Row  Name 07/31/22 1300     Education   Cardiac Education Topics Pritikin   Customer service manager   Weekly Topic Comforting Weekend Breakfasts   Instruction Review Code 1- Verbalizes Understanding   Class Start Time 1143   Class Stop Time 1216   Class Time Calculation (min) 33 min    Row Name 08/05/22 1600     Education   Cardiac Education Topics Pritikin   Select Core Videos     Core Videos   Educator Dietitian   Select Nutrition   Nutrition Facts on Fat   Instruction Review Code 1- Verbalizes Understanding   Class Start Time 1150   Class Stop Time 1222   Class Time Calculation (min) 32 min    Row Name 08/07/22 1300  Education   Cardiac Education Topics Pritikin   Customer service manager   Weekly Topic Fast Evening Meals   Instruction Review Code 1- Verbalizes Understanding   Class Start Time 1138   Class Stop Time 1216   Class Time Calculation (min) 38 min    Row Name 08/09/22 1300     Education   Cardiac Education Topics Pritikin   Select Core Videos     Core Videos   Educator Dietitian   Select Nutrition   Nutrition Vitamins and Minerals   Instruction Review Code 1- Verbalizes Understanding   Class Start Time 1145   Class Stop Time 1230   Class Time Calculation (min) 45 min    Row Name 08/12/22 1300     Education   Cardiac Education Topics Pritikin   Geographical information systems officer Psychosocial   Psychosocial Workshop Healthy Sleep for a Healthy Heart   Instruction Review Code 1- Verbalizes Understanding   Class Start Time 1148   Class Stop Time 1235   Class Time Calculation (min) 47 min            Education     Row Name 07/26/22 1200     Education   Cardiac Education Topics Pritikin   Psychologist, forensic General Education   General Education  Hypertension and Heart Disease   Instruction Review Code 1- Verbalizes Understanding   Class Start Time 1141   Class Stop Time 1217   Class Time Calculation (min) 36 min    Row Name 07/29/22 1400     Education   Cardiac Education Topics Pritikin   Psychologist, forensic Exercise Education   Exercise Education Biomechanial Limitations   Instruction Review Code 1- Verbalizes Understanding   Class Start Time 1150   Class Stop Time 1230   Class Time Calculation (min) 40 min    Row Name 07/31/22 1300     Education   Cardiac Education Topics Pritikin   Customer service manager   Weekly Topic Comforting Weekend Breakfasts   Instruction Review Code 1- Verbalizes Understanding   Class Start Time 1143   Class Stop Time 1216   Class Time Calculation (min) 33 min    Row Name 08/05/22 1600     Education   Cardiac Education Topics Pritikin   Nurse, children's   Educator Dietitian   Select Nutrition   Nutrition Facts on Fat   Instruction Review Code 1- Verbalizes Understanding   Class Start Time 1150   Class Stop Time 1222   Class Time Calculation (min) 32 min    Row Name 08/07/22 1300     Education   Cardiac Education Topics Pritikin   Orthoptist   Educator Dietitian   Weekly Topic Fast Evening Meals   Instruction Review Code 1- Verbalizes Understanding   Class Start Time 1138   Class Stop Time 1216   Class Time Calculation (min) 38 min    Row Name 08/09/22 1300     Education   Cardiac Education Topics Pritikin   Psychologist, sport and exercise     Core Videos  Educator Dietitian   Select Nutrition   Nutrition Vitamins and Minerals   Instruction Review Code 1- Verbalizes Understanding   Class Start Time 1145   Class Stop Time 1230   Class Time Calculation (min) 45 min    Row Name 08/12/22 1300     Education   Cardiac  Education Topics Pritikin   Geographical information systems officer Psychosocial   Psychosocial Workshop Healthy Sleep for a Healthy Heart   Instruction Review Code 1- Verbalizes Understanding   Class Start Time 1148   Class Stop Time 1235   Class Time Calculation (min) 47 min            Education     Row Name 07/26/22 1200     Education   Cardiac Education Topics Pritikin   Psychologist, forensic General Education   General Education Hypertension and Heart Disease   Instruction Review Code 1- Verbalizes Understanding   Class Start Time 1141   Class Stop Time 1217   Class Time Calculation (min) 36 min    Row Name 07/29/22 1400     Education   Cardiac Education Topics Pritikin   Psychologist, forensic Exercise Education   Exercise Education Biomechanial Limitations   Instruction Review Code 1- Verbalizes Understanding   Class Start Time 1150   Class Stop Time 1230   Class Time Calculation (min) 40 min    Row Name 07/31/22 1300     Education   Cardiac Education Topics Pritikin   Customer service manager   Weekly Topic Comforting Weekend Breakfasts   Instruction Review Code 1- Verbalizes Understanding   Class Start Time 1143   Class Stop Time 1216   Class Time Calculation (min) 33 min    Row Name 08/05/22 1600     Education   Cardiac Education Topics Pritikin   Nurse, children's   Educator Dietitian   Select Nutrition   Nutrition Facts on Fat   Instruction Review Code 1- Verbalizes Understanding   Class Start Time 1150   Class Stop Time 1222   Class Time Calculation (min) 32 min    Row Name 08/07/22 1300     Education   Cardiac Education Topics Pritikin   Orthoptist   Educator Dietitian   Weekly Topic Fast Evening  Meals   Instruction Review Code 1- Verbalizes Understanding   Class Start Time 1138   Class Stop Time 1216   Class Time Calculation (min) 38 min    Row Name 08/09/22 1300     Education   Cardiac Education Topics Pritikin   Licensed conveyancer Nutrition   Nutrition Vitamins and Minerals   Instruction Review Code 1- Verbalizes Understanding   Class Start Time 1145   Class Stop Time 1230   Class Time Calculation (min) 45 min    Row Name 08/12/22 1300     Education   Cardiac Education Topics Pritikin   Geographical information systems officer Psychosocial   Psychosocial Workshop Healthy Sleep for a Healthy  Heart   Instruction Review Code 1- Verbalizes Understanding   Class Start Time 1148   Class Stop Time 1235   Class Time Calculation (min) 47 min            Core Videos: Exercise    Move It!  Clinical staff conducted group or individual video education with verbal and written material and guidebook.  Patient learns the recommended Pritikin exercise program. Exercise with the goal of living a long, healthy life. Some of the health benefits of exercise include controlled diabetes, healthier blood pressure levels, improved cholesterol levels, improved heart and lung capacity, improved sleep, and better body composition. Everyone should speak with their doctor before starting or changing an exercise routine.  Biomechanical Limitations Clinical staff conducted group or individual video education with verbal and written material and guidebook.  Patient learns how biomechanical limitations can impact exercise and how we can mitigate and possibly overcome limitations to have an impactful and balanced exercise routine.  Body Composition Clinical staff conducted group or individual video education with verbal and written material and guidebook.  Patient learns that body composition (ratio of muscle  mass to fat mass) is a key component to assessing overall fitness, rather than body weight alone. Increased fat mass, especially visceral belly fat, can put [place] at increased risk for metabolic syndrome, type 2 diabetes, heart disease, and even death. It is recommended to combine diet and exercise (cardiovascular and resistance training) to improve your body composition. Seek guidance from your physician and exercise physiologist before implementing an exercise routine.  Exercise Action Plan Clinical staff conducted group or individual video education with verbal and written material and guidebook.  Patient learns the recommended strategies to achieve and enjoy long-term exercise adherence, including variety, self-motivation, self-efficacy, and positive decision making. Benefits of exercise include fitness, good health, weight management, more energy, better sleep, less stress, and overall well-being.  Medical   Heart Disease Risk Reduction Clinical staff conducted group or individual video education with verbal and written material and guidebook.  Patient learns our heart is our most vital organ as it circulates oxygen, nutrients, white blood cells, and hormones throughout the entire body, and carries waste away. Data supports a plant-based eating plan like the Pritikin Program for its effectiveness in slowing progression of and reversing heart disease. The video provides a number of recommendations to address heart disease.   Metabolic Syndrome and Belly Fat  Clinical staff conducted group or individual video education with verbal and written material and guidebook.  Patient learns what metabolic syndrome is, how it leads to heart disease, and how one can reverse it and keep it from coming back. You have metabolic syndrome if you have 3 of the following 5 criteria: abdominal obesity, high blood pressure, high triglycerides, low HDL cholesterol, and high blood sugar.  Hypertension and Heart  Disease Clinical staff conducted group or individual video education with verbal and written material and guidebook.  Patient learns that high blood pressure, or hypertension, is very common in the [place]. Hypertension is largely due to excessive salt intake, but other important risk factors include being overweight, physical inactivity, drinking too much alcohol, smoking, and not eating enough potassium from fruits and vegetables. High blood pressure is a leading risk factor for heart attack, stroke, congestive heart failure, dementia, kidney failure, and premature death. Long-term effects of excessive salt intake include stiffening of the arteries and thickening of heart muscle and organ damage. Recommendations include ways to reduce hypertension and the risk  of heart disease.  Diseases of Our Time - Focusing on Diabetes Clinical staff conducted group or individual video education with verbal and written material and guidebook.  Patient learns why the best way to stop diseases of our time is prevention, through food and other lifestyle changes. Medicine (such as prescription pills and surgeries) is often only a Band-Aid on the problem, not a long-term solution. Most common diseases of our time include obesity, type 2 diabetes, hypertension, heart disease, and cancer. The Pritikin Program is recommended and has been proven to help reduce, reverse, and/or prevent the damaging effects of metabolic syndrome.  Nutrition   Overview of the Pritikin Eating Plan  Clinical staff conducted group or individual video education with verbal and written material and guidebook.  Patient learns about the Pritikin Eating Plan for disease risk reduction. The Pritikin Eating Plan emphasizes a wide variety of unrefined, minimally-processed carbohydrates, like fruits, vegetables, whole grains, and legumes. Go, Caution, and Stop food choices are explained. Plant-based and lean animal proteins are emphasized. Rationale  provided for low sodium intake for blood pressure control, low added sugars for blood sugar stabilization, and low added fats and oils for coronary artery disease risk reduction and weight management.  Calorie Density  Clinical staff conducted group or individual video education with verbal and written material and guidebook.  Patient learns about calorie density and how it impacts the Pritikin Eating Plan. Knowing the characteristics of the food you choose will help you decide whether those foods will lead to weight gain or weight loss, and whether you want to consume more or less of them. Weight loss is usually a side effect of the Pritikin Eating Plan because of its focus on low calorie-dense foods.  Label Reading  Clinical staff conducted group or individual video education with verbal and written material and guidebook.  Patient learns about the Pritikin recommended label reading guidelines and corresponding recommendations regarding calorie density, added sugars, sodium content, and whole grains.  Dining Out - Part 1  Clinical staff conducted group or individual video education with verbal and written material and guidebook.  Patient learns that restaurant meals can be sabotaging because they can be so high in calories, fat, sodium, and/or sugar. Patient learns recommended strategies on how to positively address this and avoid unhealthy pitfalls.  Facts on Fats  Clinical staff conducted group or individual video education with verbal and written material and guidebook.  Patient learns that lifestyle modifications can be just as effective, if not more so, as many medications for lowering your risk of heart disease. A Pritikin lifestyle can help to reduce your risk of inflammation and atherosclerosis (cholesterol build-up, or plaque, in the artery walls). Lifestyle interventions such as dietary choices and physical activity address the cause of atherosclerosis. A review of the types of fats and  their impact on blood cholesterol levels, along with dietary recommendations to reduce fat intake is also included.  Nutrition Action Plan  Clinical staff conducted group or individual video education with verbal and written material and guidebook.  Patient learns how to incorporate Pritikin recommendations into their lifestyle. Recommendations include planning and keeping personal health goals in mind as an important part of their success.  Healthy Mind-Set    Healthy Minds, Bodies, Hearts  Clinical staff conducted group or individual video education with verbal and written material and guidebook.  Patient learns how to identify when they are stressed. Video will discuss the impact of that stress, as well as the many benefits of  stress management. Patient will also be introduced to stress management techniques. The way we think, act, and feel has an impact on our hearts.  How Our Thoughts Can Heal Our Hearts  Clinical staff conducted group or individual video education with verbal and written material and guidebook.  Patient learns that negative thoughts can cause depression and anxiety. This can result in negative lifestyle behavior and serious health problems. Cognitive behavioral therapy is an effective method to help control our thoughts in order to change and improve our emotional outlook.  Additional Videos:  Exercise    Improving Performance  Clinical staff conducted group or individual video education with verbal and written material and guidebook.  Patient learns to use a non-linear approach by alternating intensity levels and lengths of time spent exercising to help burn more calories and lose more body fat. Cardiovascular exercise helps improve heart health, metabolism, hormonal balance, blood sugar control, and recovery from fatigue. Resistance training improves strength, endurance, balance, coordination, reaction time, metabolism, and muscle mass. Flexibility exercise improves  circulation, posture, and balance. Seek guidance from your physician and exercise physiologist before implementing an exercise routine and learn your capabilities and proper form for all exercise.  Introduction to Yoga  Clinical staff conducted group or individual video education with verbal and written material and guidebook.  Patient learns about yoga, a discipline of the coming together of mind, breath, and body. The benefits of yoga include improved flexibility, improved range of motion, better posture and core strength, increased lung function, weight loss, and positive self-image. Yoga's heart health benefits include lowered blood pressure, healthier heart rate, decreased cholesterol and triglyceride levels, improved immune function, and reduced stress. Seek guidance from your physician and exercise physiologist before implementing an exercise routine and learn your capabilities and proper form for all exercise.  Medical   Aging: Enhancing Your Quality of Life  Clinical staff conducted group or individual video education with verbal and written material and guidebook.  Patient learns key strategies and recommendations to stay in good physical health and enhance quality of life, such as prevention strategies, having an advocate, securing a Health Care Proxy and Power of Attorney, and keeping a list of medications and system for tracking them. It also discusses how to avoid risk for bone loss.  Biology of Weight Control  Clinical staff conducted group or individual video education with verbal and written material and guidebook.  Patient learns that weight gain occurs because we consume more calories than we burn (eating more, moving less). Even if your body weight is normal, you may have higher ratios of fat compared to muscle mass. Too much body fat puts you at increased risk for cardiovascular disease, heart attack, stroke, type 2 diabetes, and obesity-related cancers. In addition to exercise,  following the Pritikin Eating Plan can help reduce your risk.  Decoding Lab Results  Clinical staff conducted group or individual video education with verbal and written material and guidebook.  Patient learns that lab test reflects one measurement whose values change over time and are influenced by many factors, including medication, stress, sleep, exercise, food, hydration, pre-existing medical conditions, and more. It is recommended to use the knowledge from this video to become more involved with your lab results and evaluate your numbers to speak with your doctor.   Diseases of Our Time - Overview  Clinical staff conducted group or individual video education with verbal and written material and guidebook.  Patient learns that according to the CDC, 50% to 70% of  chronic diseases (such as obesity, type 2 diabetes, elevated lipids, hypertension, and heart disease) are avoidable through lifestyle improvements including healthier food choices, listening to satiety cues, and increased physical activity.  Sleep Disorders Clinical staff conducted group or individual video education with verbal and written material and guidebook.  Patient learns how good quality and duration of sleep are important to overall health and well-being. Patient also learns about sleep disorders and how they impact health along with recommendations to address them, including discussing with a physician.  Nutrition  Dining Out - Part 2 Clinical staff conducted group or individual video education with verbal and written material and guidebook.  Patient learns how to plan ahead and communicate in order to maximize their dining experience in a healthy and nutritious manner. Included are recommended food choices based on the type of restaurant the patient is visiting.   Fueling a Banker conducted group or individual video education with verbal and written material and guidebook.  There is a strong  connection between our food choices and our health. Diseases like obesity and type 2 diabetes are very prevalent and are in large-part due to lifestyle choices. The Pritikin Eating Plan provides plenty of food and hunger-curbing satisfaction. It is easy to follow, affordable, and helps reduce health risks.  Menu Workshop  Clinical staff conducted group or individual video education with verbal and written material and guidebook.  Patient learns that restaurant meals can sabotage health goals because they are often packed with calories, fat, sodium, and sugar. Recommendations include strategies to plan ahead and to communicate with the manager, chef, or server to help order a healthier meal.  Planning Your Eating Strategy  Clinical staff conducted group or individual video education with verbal and written material and guidebook.  Patient learns about the Pritikin Eating Plan and its benefit of reducing the risk of disease. The Pritikin Eating Plan does not focus on calories. Instead, it emphasizes high-quality, nutrient-rich foods. By knowing the characteristics of the foods, we choose, we can determine their calorie density and make informed decisions.  Targeting Your Nutrition Priorities  Clinical staff conducted group or individual video education with verbal and written material and guidebook.  Patient learns that lifestyle habits have a tremendous impact on disease risk and progression. This video provides eating and physical activity recommendations based on your personal health goals, such as reducing LDL cholesterol, losing weight, preventing or controlling type 2 diabetes, and reducing high blood pressure.  Vitamins and Minerals  Clinical staff conducted group or individual video education with verbal and written material and guidebook.  Patient learns different ways to obtain key vitamins and minerals, including through a recommended healthy diet. It is important to discuss all supplements  you take with your doctor.   Healthy Mind-Set    Smoking Cessation  Clinical staff conducted group or individual video education with verbal and written material and guidebook.  Patient learns that cigarette smoking and tobacco addiction pose a serious health risk which affects millions of people. Stopping smoking will significantly reduce the risk of heart disease, lung disease, and many forms of cancer. Recommended strategies for quitting are covered, including working with your doctor to develop a successful plan.  Culinary   Becoming a Set designer conducted group or individual video education with verbal and written material and guidebook.  Patient learns that cooking at home can be healthy, cost-effective, quick, and puts them in control. Keys to cooking healthy recipes will include  looking at your recipe, assessing your equipment needs, planning ahead, making it simple, choosing cost-effective seasonal ingredients, and limiting the use of added fats, salts, and sugars.  Cooking - Breakfast and Snacks  Clinical staff conducted group or individual video education with verbal and written material and guidebook.  Patient learns how important breakfast is to satiety and nutrition through the entire day. Recommendations include key foods to eat during breakfast to help stabilize blood sugar levels and to prevent overeating at meals later in the day. Planning ahead is also a key component.  Cooking - Educational psychologist conducted group or individual video education with verbal and written material and guidebook.  Patient learns eating strategies to improve overall health, including an approach to cook more at home. Recommendations include thinking of animal protein as a side on your plate rather than center stage and focusing instead on lower calorie dense options like vegetables, fruits, whole grains, and plant-based proteins, such as beans. Making sauces in large  quantities to freeze for later and leaving the skin on your vegetables are also recommended to maximize your experience.  Cooking - Healthy Salads and Dressing Clinical staff conducted group or individual video education with verbal and written material and guidebook.  Patient learns that vegetables, fruits, whole grains, and legumes are the foundations of the Pritikin Eating Plan. Recommendations include how to incorporate each of these in flavorful and healthy salads, and how to create homemade salad dressings. Proper handling of ingredients is also covered. Cooking - Soups and [employer] - Soups and Desserts Clinical staff conducted group or individual video education with verbal and written material and guidebook.  Patient learns that Pritikin soups and desserts make for easy, nutritious, and delicious snacks and meal components that are low in sodium, fat, sugar, and calorie density, while high in vitamins, minerals, and filling fiber. Recommendations include simple and healthy ideas for soups and desserts.   Overview     The Pritikin Solution Program Overview Clinical staff conducted group or individual video education with verbal and written material and guidebook.  Patient learns that the results of the Pritikin Program have been documented in more than 100 articles published in peer-reviewed journals, and the benefits include reducing risk factors for (and, in some cases, even reversing) high cholesterol, high blood pressure, type 2 diabetes, obesity, and more! An overview of the three key pillars of the Pritikin Program will be covered: eating well, doing regular exercise, and having a healthy mind-set.  WORKSHOPS  Exercise: Exercise Basics: Building Your Action Plan Clinical staff led group instruction and group discussion with PowerPoint presentation and patient guidebook. To enhance the learning environment the use of posters, models and videos may be added. At the conclusion of  this workshop, patients will comprehend the difference between physical activity and exercise, as well as the benefits of incorporating both, into their routine. Patients will understand the FITT (Frequency, Intensity, Time, and Type) principle and how to use it to build an exercise action plan. In addition, safety concerns and other considerations for exercise and cardiac rehab will be addressed by the presenter. The purpose of this lesson is to promote a comprehensive and effective weekly exercise routine in order to improve patients' overall level of fitness.   Managing Heart Disease: Your Path to a Healthier Heart Clinical staff led group instruction and group discussion with PowerPoint presentation and patient guidebook. To enhance the learning environment the use of posters, models and videos may be  added.At the conclusion of this workshop, patients will understand the anatomy and physiology of the heart. Additionally, they will understand how Pritikin's three pillars impact the risk factors, the progression, and the management of heart disease.  The purpose of this lesson is to provide a high-level overview of the heart, heart disease, and how the Pritikin lifestyle positively impacts risk factors.  Exercise Biomechanics Clinical staff led group instruction and group discussion with PowerPoint presentation and patient guidebook. To enhance the learning environment the use of posters, models and videos may be added. Patients will learn how the structural parts of their bodies function and how these functions impact their daily activities, movement, and exercise. Patients will learn how to promote a neutral spine, learn how to manage pain, and identify ways to improve their physical movement in order to promote healthy living. The purpose of this lesson is to expose patients to common physical limitations that impact physical activity. Participants will learn practical ways to adapt and  manage aches and pains, and to minimize their effect on regular exercise. Patients will learn how to maintain good posture while sitting, walking, and lifting.  Balance Training and Fall Prevention  Clinical staff led group instruction and group discussion with PowerPoint presentation and patient guidebook. To enhance the learning environment the use of posters, models and videos may be added. At the conclusion of this workshop, patients will understand the importance of their sensorimotor skills (vision, proprioception, and the vestibular system) in maintaining their ability to balance as they age. Patients will apply a variety of balancing exercises that are appropriate for their current level of function. Patients will understand the common causes for poor balance, possible solutions to these problems, and ways to modify their physical environment in order to minimize their fall risk. The purpose of this lesson is to teach patients about the importance of maintaining balance as they age and ways to minimize their risk of falling.  WORKSHOPS   Nutrition:  Fueling a Ship broker led group instruction and group discussion with PowerPoint presentation and patient guidebook. To enhance the learning environment the use of posters, models and videos may be added. Patients will review the foundational principles of the Pritikin Eating Plan and understand what constitutes a serving size in each of the food groups. Patients will also learn Pritikin-friendly foods that are better choices when away from home and review make-ahead meal and snack options. Calorie density will be reviewed and applied to three nutrition priorities: weight maintenance, weight loss, and weight gain. The purpose of this lesson is to reinforce (in a group setting) the key concepts around what patients are recommended to eat and how to apply these guidelines when away from home by planning and selecting Pritikin-friendly  options. Patients will understand how calorie density may be adjusted for different weight management goals.  Mindful Eating  Clinical staff led group instruction and group discussion with PowerPoint presentation and patient guidebook. To enhance the learning environment the use of posters, models and videos may be added. Patients will briefly review the concepts of the Pritikin Eating Plan and the importance of low-calorie dense foods. The concept of mindful eating will be introduced as well as the importance of paying attention to internal hunger signals. Triggers for non-hunger eating and techniques for dealing with triggers will be explored. The purpose of this lesson is to provide patients with the opportunity to review the basic principles of the Pritikin Eating Plan, discuss the value of eating mindfully and  how to measure internal cues of hunger and fullness using the Hunger Scale. Patients will also discuss reasons for non-hunger eating and learn strategies to use for controlling emotional eating.  Targeting Your Nutrition Priorities Clinical staff led group instruction and group discussion with PowerPoint presentation and patient guidebook. To enhance the learning environment the use of posters, models and videos may be added. Patients will learn how to determine their genetic susceptibility to disease by reviewing their family history. Patients will gain insight into the importance of diet as part of an overall healthy lifestyle in mitigating the impact of genetics and other environmental insults. The purpose of this lesson is to provide patients with the opportunity to assess their personal nutrition priorities by looking at their family history, their own health history and current risk factors. Patients will also be able to discuss ways of prioritizing and modifying the Pritikin Eating Plan for their highest risk areas  Menu  Clinical staff led group instruction and group discussion with  PowerPoint presentation and patient guidebook. To enhance the learning environment the use of posters, models and videos may be added. Using menus brought in from [employer], or printed from [employer], patients will apply the Pritikin dining out guidelines that were presented in the [employer] video. Patients will also be able to practice these guidelines in a variety of provided scenarios. The purpose of this lesson is to provide patients with the opportunity to practice hands-on learning of the Pritikin Dining Out guidelines with actual menus and practice scenarios.  Label Reading Clinical staff led group instruction and group discussion with PowerPoint presentation and patient guidebook. To enhance the learning environment the use of posters, models and videos may be added. Patients will review and discuss the Pritikin label reading guidelines presented in Pritikin's Label Reading Educational series video. Using fool labels brought in from local grocery stores and markets, patients will apply the label reading guidelines and determine if the packaged food meet the Pritikin guidelines. The purpose of this lesson is to provide patients with the opportunity to review, discuss, and practice hands-on learning of the Pritikin Label Reading guidelines with actual packaged food labels. Cooking School  Pritikin's [employer] are designed to teach patients ways to prepare quick, simple, and affordable recipes at home. The importance of nutrition's role in chronic disease risk reduction is reflected in its emphasis in the overall Pritikin program. By learning how to prepare essential core Pritikin Eating Plan recipes, patients will increase control over what they eat; be able to customize the flavor of foods without the use of added salt, sugar, or fat; and improve the quality of the food they consume. By learning a set of core recipes which are easily assembled, quickly  prepared, and affordable, patients are more likely to prepare more healthy foods at home. These workshops focus on convenient breakfasts, simple entres, side dishes, and desserts which can be prepared with minimal effort and are consistent with nutrition recommendations for cardiovascular risk reduction. Cooking [employer] are taught by a Armed forces logistics/support/administrative officer (RD) who has been trained by the [employer]. The chef or RD has a clear understanding of the importance of minimizing - if not completely eliminating - added fat, sugar, and sodium in recipes. Throughout the series of Cooking School Workshop sessions, patients will learn about healthy ingredients and efficient methods of cooking to build confidence in their capability to prepare    [employer] weekly topics:  Adding  Flavor- Sodium-Free  Fast and Healthy Breakfasts  Powerhouse Plant-Based Proteins  Satisfying Salads and Dressings  Simple Sides and Sauces  International Cuisine-Spotlight on the Blue Zones  Delicious Desserts  Savory Soups  Efficiency Cooking - Meals in a Snap  Tasty Appetizers and Snacks  Comforting Weekend Breakfasts  One-Pot Wonders   Fast Evening Meals  Landscape architect Your Pritikin Plate  WORKSHOPS   Healthy Mindset (Psychosocial):  Focused Goals, Sustainable Changes Clinical staff led group instruction and group discussion with PowerPoint presentation and patient guidebook. To enhance the learning environment the use of posters, models and videos may be added. Patients will be able to apply effective goal setting strategies to establish at least one personal goal, and then take consistent, meaningful action toward that goal. They will learn to identify common barriers to achieving personal goals and develop strategies to overcome them. Patients will also gain an understanding of how our mind-set can impact our ability to achieve goals and the importance of  cultivating a positive and growth-oriented mind-set. The purpose of this lesson is to provide patients with a deeper understanding of how to set and achieve personal goals, as well as the tools and strategies needed to overcome common obstacles which may arise along the way.  From Head to Heart: The Power of a Healthy Outlook  Clinical staff led group instruction and group discussion with PowerPoint presentation and patient guidebook. To enhance the learning environment the use of posters, models and videos may be added. Patients will be able to recognize and describe the impact of emotions and mood on physical health. They will discover the importance of self-care and explore self-care practices which may work for them. Patients will also learn how to utilize the 4 C's to cultivate a healthier outlook and better manage stress and challenges. The purpose of this lesson is to demonstrate to patients how a healthy outlook is an essential part of maintaining good health, especially as they continue their cardiac rehab journey.  Healthy Sleep for a Healthy Heart Clinical staff led group instruction and group discussion with PowerPoint presentation and patient guidebook. To enhance the learning environment the use of posters, models and videos may be added. At the conclusion of this workshop, patients will be able to demonstrate knowledge of the importance of sleep to overall health, well-being, and quality of life. They will understand the symptoms of, and treatments for, common sleep disorders. Patients will also be able to identify daytime and nighttime behaviors which impact sleep, and they will be able to apply these tools to help manage sleep-related challenges. The purpose of this lesson is to provide patients with a general overview of sleep and outline the importance of quality sleep. Patients will learn about a few of the most common sleep disorders. Patients will also be introduced to the concept of  "sleep hygiene," and discover ways to self-manage certain sleeping problems through simple daily behavior changes. Finally, the workshop will motivate patients by clarifying the links between quality sleep and their goals of heart-healthy living.   Recognizing and Reducing Stress Clinical staff led group instruction and group discussion with PowerPoint presentation and patient guidebook. To enhance the learning environment the use of posters, models and videos may be added. At the conclusion of this workshop, patients will be able to understand the types of stress reactions, differentiate between acute and chronic stress, and recognize the impact that chronic stress has on their health. They will also be able to apply different  coping mechanisms, such as reframing negative self-talk. Patients will have the opportunity to practice a variety of stress management techniques, such as deep abdominal breathing, progressive muscle relaxation, and/or guided imagery.  The purpose of this lesson is to educate patients on the role of stress in their lives and to provide healthy techniques for coping with it.  Learning Barriers/Preferences:  Learning Barriers/Preferences - 07/09/22 1246       Learning Barriers/Preferences   Learning Barriers Sight;Hearing   wears glasses and hearing aids   Learning Preferences Written Material;Computer/Internet             Education Topics:  Knowledge Questionnaire Score:  Knowledge Questionnaire Score - 07/09/22 1244       Knowledge Questionnaire Score   Pre Score 22/24             Core Components/Risk Factors/Patient Goals at Admission:  Personal Goals and Risk Factors at Admission - 07/09/22 1244       Core Components/Risk Factors/Patient Goals on Admission   Diabetes Yes    Intervention Provide education about signs/symptoms and action to take for hypo/hyperglycemia.;Provide education about proper nutrition, including hydration, and aerobic/resistive  exercise prescription along with prescribed medications to achieve blood glucose in normal ranges: Fasting glucose 65-99 mg/dL    Expected Outcomes Short Term: Participant verbalizes understanding of the signs/symptoms and immediate care of hyper/hypoglycemia, proper foot care and importance of medication, aerobic/resistive exercise and nutrition plan for blood glucose control.;Long Term: Attainment of HbA1C < 7%.    Heart Failure Yes    Intervention Provide a combined exercise and nutrition program that is supplemented with education, support and counseling about heart failure. Directed toward relieving symptoms such as shortness of breath, decreased exercise tolerance, and extremity edema.    Expected Outcomes Improve functional capacity of life;Short term: Attendance in program 2-3 days a week with increased exercise capacity. Reported lower sodium intake. Reported increased fruit and vegetable intake. Reports medication compliance.;Short term: Daily weights obtained and reported for increase. Utilizing diuretic protocols set by physician.;Long term: Adoption of self-care skills and reduction of barriers for early signs and symptoms recognition and intervention leading to self-care maintenance.    Hypertension Yes    Intervention Monitor prescription use compliance.;Provide education on lifestyle modifcations including regular physical activity/exercise, weight management, moderate sodium restriction and increased consumption of fresh fruit, vegetables, and low fat dairy, alcohol moderation, and smoking cessation.    Expected Outcomes Short Term: Continued assessment and intervention until BP is < 140/32mm HG in hypertensive participants. < 130/87mm HG in hypertensive participants with diabetes, heart failure or chronic kidney disease.;Long Term: Maintenance of blood pressure at goal levels.    Lipids Yes    Intervention Provide education and support for participant on nutrition & aerobic/resistive  exercise along with prescribed medications to achieve LDL 70mg , HDL >40mg .    Expected Outcomes Short Term: Participant states understanding of desired cholesterol values and is compliant with medications prescribed. Participant is following exercise prescription and nutrition guidelines.;Long Term: Cholesterol controlled with medications as prescribed, with individualized exercise RX and with personalized nutrition plan. Value goals: LDL < 70mg , HDL > 40 mg.             Core Components/Risk Factors/Patient Goals Review:   Goals and Risk Factor Review     Row Name 08/13/22 1547             Core Components/Risk Factors/Patient Goals Review   Personal Goals Review Weight Management/Obesity;Lipids;Diabetes;Stress;Hypertension;Heart Failure  Review Jaivin is off to a good start to exercise at cardiac rehab. Blood pressures have improved since Dr Nelly Laurence discontinued Harlan's entresto. Aryel has lost 2.2 kg since starting cardiac rehab.       Expected Outcomes Olvin will continue to participate in cardiac rehab for exercise, nutrition and lifestyle modifications                Core Components/Risk Factors/Patient Goals at Discharge (Final Review):   Goals and Risk Factor Review - 08/13/22 1547       Core Components/Risk Factors/Patient Goals Review   Personal Goals Review Weight Management/Obesity;Lipids;Diabetes;Stress;Hypertension;Heart Failure    Review [place] is off to a good start to exercise at cardiac rehab. Blood pressures have improved since Dr Nelly Laurence discontinued Asaph's entresto. Simba has lost 2.2 kg since starting cardiac rehab.    Expected Outcomes Elester will continue to participate in cardiac rehab for exercise, nutrition and lifestyle modifications             ITP Comments:  ITP Comments     Row Name 07/09/22 0936 08/13/22 1545         ITP Comments Armanda Magic, MD: Medical Director.  Introduction to the [facility]/Intensive Cardiac Rehab.  Initial orientation packet reviewed. 30 day ITP Review. Lamoine is off to a good start to exercise at cardiac rehab               ITP Comments     Row Name 07/09/22 1610 08/13/22 1545         ITP Comments Armanda Magic, MD: Medical Director.  Introduction to the [facility]/Intensive Cardiac Rehab.  Initial orientation packet reviewed. 30 day ITP Review. Reif is off to a good start to exercise at cardiac rehab               Comments: See ITP comments.

## 2022-08-14 ENCOUNTER — Encounter (HOSPITAL_COMMUNITY)
Admission: RE | Admit: 2022-08-14 | Discharge: 2022-08-14 | Disposition: A | Discharge status: Home / Self Care | Payer: Medicare (Managed Care) | Source: Ambulatory Visit | Attending: Internal Medicine | Admitting: Internal Medicine

## 2022-08-14 DIAGNOSIS — I214 Non-ST elevation (NSTEMI) myocardial infarction: Secondary | ICD-10-CM | POA: Diagnosis not present

## 2022-08-15 ENCOUNTER — Encounter: Payer: Self-pay | Admitting: Internal Medicine

## 2022-08-15 ENCOUNTER — Encounter: Payer: Self-pay | Admitting: Family Medicine

## 2022-08-16 ENCOUNTER — Encounter (HOSPITAL_COMMUNITY)
Admission: RE | Admit: 2022-08-16 | Discharge: 2022-08-16 | Disposition: A | Discharge status: Home / Self Care | Payer: Medicare (Managed Care) | Source: Ambulatory Visit | Attending: Internal Medicine | Admitting: Internal Medicine

## 2022-08-16 DIAGNOSIS — I214 Non-ST elevation (NSTEMI) myocardial infarction: Secondary | ICD-10-CM | POA: Diagnosis present

## 2022-08-16 DIAGNOSIS — Z48812 Encounter for surgical aftercare following surgery on the circulatory system: Secondary | ICD-10-CM | POA: Insufficient documentation

## 2022-08-16 DIAGNOSIS — I252 Old myocardial infarction: Secondary | ICD-10-CM | POA: Diagnosis not present

## 2022-08-19 ENCOUNTER — Encounter (HOSPITAL_COMMUNITY): Admission: RE | Admit: 2022-08-19 | Payer: Medicare (Managed Care) | Source: Ambulatory Visit

## 2022-08-19 DIAGNOSIS — I214 Non-ST elevation (NSTEMI) myocardial infarction: Secondary | ICD-10-CM

## 2022-08-19 DIAGNOSIS — Z48812 Encounter for surgical aftercare following surgery on the circulatory system: Secondary | ICD-10-CM | POA: Diagnosis not present

## 2022-08-20 NOTE — Telephone Encounter (Signed)
Called Raymond Christensen to f/u status of heart cath video request.  Raymond Christensen reports Dr. Lawana Chambers office advised they sent request to number provided to Raymond Christensen.   I advised Raymond Christensen to send a my chart message asking about the status of request and to let me know what he is told; so that I can f/u if needed. Raymond Christensen is agreeable to plan.

## 2022-08-21 ENCOUNTER — Encounter (HOSPITAL_COMMUNITY): Admission: RE | Admit: 2022-08-21 | Payer: Medicare (Managed Care) | Source: Ambulatory Visit

## 2022-08-21 ENCOUNTER — Other Ambulatory Visit: Payer: Self-pay | Admitting: Internal Medicine

## 2022-08-21 DIAGNOSIS — I214 Non-ST elevation (NSTEMI) myocardial infarction: Secondary | ICD-10-CM

## 2022-08-21 DIAGNOSIS — Z48812 Encounter for surgical aftercare following surgery on the circulatory system: Secondary | ICD-10-CM | POA: Diagnosis not present

## 2022-08-22 ENCOUNTER — Other Ambulatory Visit: Payer: Self-pay

## 2022-08-22 MED ORDER — ATORVASTATIN CALCIUM 80 MG PO TABS: 80.0000 mg | ORAL_TABLET | Freq: Every day | ORAL | 3 refills | Status: DC

## 2022-08-23 ENCOUNTER — Encounter (HOSPITAL_COMMUNITY)
Admission: RE | Admit: 2022-08-23 | Discharge: 2022-08-23 | Disposition: A | Discharge status: Home / Self Care | Payer: Medicare (Managed Care) | Source: Ambulatory Visit | Attending: Internal Medicine | Admitting: Internal Medicine

## 2022-08-23 DIAGNOSIS — I214 Non-ST elevation (NSTEMI) myocardial infarction: Secondary | ICD-10-CM

## 2022-08-23 DIAGNOSIS — Z48812 Encounter for surgical aftercare following surgery on the circulatory system: Secondary | ICD-10-CM | POA: Diagnosis not present

## 2022-08-26 ENCOUNTER — Encounter (HOSPITAL_COMMUNITY)
Admission: RE | Admit: 2022-08-26 | Discharge: 2022-08-26 | Disposition: A | Discharge status: Home / Self Care | Payer: Medicare (Managed Care) | Source: Ambulatory Visit | Attending: Internal Medicine | Admitting: Internal Medicine

## 2022-08-26 DIAGNOSIS — I214 Non-ST elevation (NSTEMI) myocardial infarction: Secondary | ICD-10-CM

## 2022-08-27 NOTE — Progress Notes (Deleted)
Cardiology Office Note:    Date:  08/27/2022   ID:  Raymond Christensen, DOB 04-14-1940, MRN [id number]  PCP:  Sharlene Dory, DO   [facility] Providers Cardiologist:  Christell Constant, MD Electrophysiologist:  Will Jorja Loa, MD { Click to update primary MD,subspecialty MD or APP then REFRESH:1}    Referring MD: Sharlene Dory*   No chief complaint on file. ***  History of Present Illness:    Raymond Christensen is a 82 y.o. male with a hx of lacunar infarct 2013, carotid artery stenosis s/p L CEA 2016, CAD s/p CABG x 4 2001 (LIMA-LAD, SVG-OM2, SVG-PDA, SVG-D2) with subsequent PCI to SVG-OM2-PDA with 2.75 x 16 mm DES in 2021, HTN, HLD, obesity, OSA on CPAP, moderate to severe MR s/p mitraclip in 2021.    NSTEMI 08/2019 with LHC resulting in DES-SVG-OM-PDA.  Patient was also found to be in atrial fibrillation.  TEE guided cardioversion was attempted but there was a left atrial thrombus seen, no DCCV.  He was started on amiodarone and ultimately underwent DCCV but reverted back to atrial fibrillation.  TEE also showed moderately severe MR with flail segment and ruptured chordae.  He was referred to structural heart and underwent MitraClip 12/2019 with Dr. Excell Seltzer.  He was referred to Dr. Elberta Fortis and underwent atrial fibrillation ablation 01/2020.  He has been anticoagulated with Eliquis.   He was admitted June 2024 with NSTEMI and repeat heart catheterization which revealed occlusion of the SVG-OM-PDA jump graft that was not amenable to stenting -he had 95% proximal graft stenosis with extensive thrombus and 85% stenosis in the OM stent region.  Medical therapy was recommended.  He followed up at [facility] to get a second opinion about whether or not PCI was an option.    He presents today for follow-up.      CAD s/p CABG with subsequent PCI - last heart cath 06/2022 with occluded SVG-OM2-PDA - awaiting second opinion from  - continue ASA, eliquis - consider  switching to plavix and eliquis - continue 80 mg lipitor - continue 12.5 mg toprol  - ? 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 +/- spironolactone     Past Medical History:  Diagnosis Date   Arthritis    CAD (coronary artery disease)    Carotid artery disease (HCC)    s/p left CEA 01/17/14   Chronic systolic CHF (congestive heart failure) (HCC)    Concussion Summer 2012   Essential hypertension 02/26/2017   Heart disease    Hyperlipidemia    Hypothyroidism 10/15/2017   Major depressive disorder    Mitral valve regurgitation    Myocardial infarction (HCC) 1996, 2021   Obstructive sleep apnea on CPAP    Persistent atrial fibrillation (HCC)    Poor flexibility of tendon 12/01/2018   S/P mitral valve clip implantation 12/23/2019  s/p TEER with MitraClip with one XTW by Dr. Excell Seltzer   Subungual hematoma of digit of hand 12/01/2018   Subungual hematoma of right foot 12/01/2018   Type 2 diabetes mellitus without complication, without long-term current use of insulin (HCC) 02/26/2017    Past Surgical History:  Procedure Laterality Date   ATRIAL FIBRILLATION ABLATION N/A 02/01/2020   Procedure: ATRIAL FIBRILLATION ABLATION;  Surgeon: Regan Lemming, MD;  Location: [facility] INVASIVE CV LAB;  Service: Cardiovascular;  Laterality: N/A;   BYPASS GRAFT  2001   CARDIAC  CATHETERIZATION     CARDIOVERSION N/A 11/08/2019   Procedure: CARDIOVERSION;  Surgeon: Wendall Stade, MD;  Location: [facility] ENDOSCOPY;  Service: Cardiovascular;  Laterality: N/A;   CAROTID ENDARTERECTOMY Left 01/17/2014   CORONARY ANGIOPLASTY     CORONARY ARTERY BYPASS GRAFT     LEFT HEART CATH AND CORS/GRAFTS ANGIOGRAPHY N/A 06/26/2022   Procedure: LEFT HEART CATH AND CORS/GRAFTS ANGIOGRAPHY;  Surgeon: Lennette Bihari, MD;  Location: [facility] INVASIVE CV LAB;  Service: Cardiovascular;  Laterality: N/A;   MITRAL VALVE REPAIR N/A 12/23/2019   Procedure: MITRAL VALVE REPAIR;  Surgeon: Tonny Bollman, MD;  Location: [facility] INVASIVE CV LAB;  Service: Cardiovascular;  Laterality: N/A;   RIGHT/LEFT HEART CATH AND CORONARY/GRAFT ANGIOGRAPHY N/A 09/09/2019   Procedure: RIGHT/LEFT HEART CATH AND CORONARY/GRAFT ANGIOGRAPHY;  Surgeon: Runell Gess, MD;  Location: [facility] INVASIVE CV LAB;  Service: Cardiovascular;  Laterality: N/A;   TEE WITHOUT CARDIOVERSION N/A 09/13/2019   Procedure: TRANSESOPHAGEAL ECHOCARDIOGRAM (TEE);  Surgeon: Jodelle Red, MD;  Location: [facility] ENDOSCOPY;  Service: Cardiovascular;  Laterality: N/A;   TEE WITHOUT CARDIOVERSION N/A 12/23/2019   Procedure: TRANSESOPHAGEAL ECHOCARDIOGRAM (TEE);  Surgeon: Tonny Bollman, MD;  Location[facility] INVASIVE CV LAB;  Service: Cardiovascular;  Laterality: N/A;   TEE WITHOUT CARDIOVERSION  02/01/2020   Procedure: TRANSESOPHAGEAL ECHOCARDIOGRAM (TEE);  Surgeon: Regan Lemming, MD;  Location: [facility] INVASIVE CV LAB;  Service: Cardiovascular;;   TOTAL HIP ARTHROPLASTY  1994, 1995    Current Medications: No outpatient medications have been marked as taking for the 08/28/22 encounter (Appointment) with Marcelino Duster, PA.     Allergies:   Sulfa antibiotics   Social History   Socioeconomic History   Marital status: Married    Spouse name: Not on file   Number of children: 2   Years of education: 44   Highest education level: Doctorate   Occupational History   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  Tobacco Use   Smoking status: Former    Current packs/day: 0.00    Types: Cigarettes    Quit date: 1980    Years since quitting: 44.6   Smokeless tobacco: Never  Vaping Use   Vaping status: Never Used  Substance and Sexual Activity   Alcohol use: Yes    Alcohol/week: 4.0 standard drinks of alcohol    Types: 1 Cans of beer, 3 Standard drinks or equivalent per week    Comment: 1 drink 3 times per week   Drug use: No   Sexual activity: Yes  Other Topics Concern   Not on file  Social History Narrative   Pt is R handed   Lives in single story home with his wife, Okey Regal   Has 2 adult children   PhD in Biology   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 [employer]    Social Determinants of Health   Financial Resource Strain: Low Risk  (07/31/2022)   Overall Financial Resource Strain (CARDIA)    Difficulty of Paying Living Expenses:  Not hard at all  Food Insecurity: No Food Insecurity (06/26/2022)   Hunger Vital Sign    Worried About Running Out of Food in the Last Year: Never true    Ran Out of Food in the Last Year: Never true  Transportation Needs: No Transportation Needs (06/26/2022)   PRAPARE - Administrator, Civil Service (Medical): No    Lack of Transportation (Non-Medical): No  Physical Activity: Inactive (07/31/2022)   Exercise Vital Sign    Days of Exercise per Week: 0 days    Minutes of Exercise per Session: 0 min  Stress: No Stress Concern Present (07/31/2022)   [employer] of Occupational Health - Occupational Stress Questionnaire    Feeling of Stress : Not at all  Social Connections: Socially Integrated (07/31/2022)   Social Connection and Isolation Panel [NHANES]    Frequency of Communication with Friends and Family: Three times a week    Frequency of Social Gatherings with Friends and Family: Once a week    Attends Religious Services: More than 4 times per year    Active Member of [employer] or Organizations:  Yes    Attends Engineer, structural: More than 4 times per year    Marital Status: Married     Family History: The patient's ***family history includes Dementia in his father. There is no history of Cancer.  ROS:   Please see the history of present illness.    *** All other systems reviewed and are negative.  EKGs/Labs/Other Studies Reviewed:    The following studies were reviewed today: ***      Recent Labs: 05/03/2022: ALT 16 06/25/2022: B Natriuretic Peptide 88.4 06/26/2022: TSH 2.028 06/27/2022: BUN 7; Creatinine, Ser 0.80; Hemoglobin 12.4; Platelets 149; Potassium 3.3; Sodium 135  Recent Lipid Panel    Component Value Date/Time   CHOL 85 06/26/2022 0445   CHOL 110 04/24/2020 1006   TRIG 53 06/26/2022 0445   HDL 40 (L) 06/26/2022 0445   HDL 42 04/24/2020 1006   CHOLHDL 2.1 06/26/2022 0445   VLDL 11 06/26/2022 0445   LDLCALC 34 06/26/2022 0445   LDLCALC 37 04/24/2020 1006   LDLDIRECT 65.0 08/31/2021 1012     Risk Assessment/Calculations:   {Does this patient have ATRIAL FIBRILLATION?:[phone]}  No BP recorded.  {Refresh Note OR Click here to enter BP  :1}***         Physical Exam:    VS:  There were no vitals taken for this visit.    Wt Readings from Last 3 Encounters:  08/05/22 208 lb 6.4 oz (94.5 kg)  07/31/22 208 lb 12.8 oz (94.7 kg)  07/30/22 210 lb 3.2 oz (95.3 kg)     GEN: *** Well nourished, well developed in no acute distress HEENT: Normal NECK: No JVD; No carotid bruits LYMPHATICS: No lymphadenopathy CARDIAC: ***RRR, no murmurs, rubs, gallops RESPIRATORY:  Clear to auscultation without rales, wheezing or rhonchi  ABDOMEN: Soft, non-tender, non-distended MUSCULOSKELETAL:  No edema; No deformity  SKIN: Warm and dry NEUROLOGIC:  Alert and oriented x 3 PSYCHIATRIC:  Normal affect   ASSESSMENT:    No diagnosis found. PLAN:    In order of problems listed above:  ***      {Are you ordering a CV Procedure (e.g. stress test,  cath, DCCV, TEE, etc)?   Press F2        :[id number]}    Medication Adjustments/Labs and Tests Ordered: Current medicines are reviewed at length with the patient today.  Concerns  regarding medicines are outlined above.  No orders of the defined types were placed in this encounter.  No orders of the defined types were placed in this encounter.   There are no Patient Instructions on file for this visit.   Signed, Marcelino Duster, [place]  08/27/2022 7:45 PM    [facility]

## 2022-08-28 ENCOUNTER — Ambulatory Visit: Payer: Medicare (Managed Care) | Admitting: Physician Assistant

## 2022-08-28 ENCOUNTER — Encounter (HOSPITAL_COMMUNITY)
Admission: RE | Admit: 2022-08-28 | Discharge: 2022-08-28 | Disposition: A | Discharge status: Home / Self Care | Payer: Medicare (Managed Care) | Source: Ambulatory Visit | Attending: Internal Medicine | Admitting: Internal Medicine

## 2022-08-28 DIAGNOSIS — I214 Non-ST elevation (NSTEMI) myocardial infarction: Secondary | ICD-10-CM

## 2022-08-28 DIAGNOSIS — Z48812 Encounter for surgical aftercare following surgery on the circulatory system: Secondary | ICD-10-CM | POA: Diagnosis not present

## 2022-08-30 ENCOUNTER — Ambulatory Visit: Payer: Medicare (Managed Care) | Admitting: Physician Assistant

## 2022-08-30 ENCOUNTER — Encounter (HOSPITAL_COMMUNITY)
Admission: RE | Admit: 2022-08-30 | Discharge: 2022-08-30 | Disposition: A | Discharge status: Home / Self Care | Payer: Medicare (Managed Care) | Source: Ambulatory Visit | Attending: Internal Medicine | Admitting: Internal Medicine

## 2022-08-30 DIAGNOSIS — Z48812 Encounter for surgical aftercare following surgery on the circulatory system: Secondary | ICD-10-CM | POA: Diagnosis not present

## 2022-08-30 DIAGNOSIS — I214 Non-ST elevation (NSTEMI) myocardial infarction: Secondary | ICD-10-CM

## 2022-09-02 ENCOUNTER — Telehealth: Payer: Self-pay | Admitting: Pulmonary Disease

## 2022-09-02 ENCOUNTER — Encounter (HOSPITAL_COMMUNITY)
Admission: RE | Admit: 2022-09-02 | Discharge: 2022-09-02 | Disposition: A | Discharge status: Home / Self Care | Payer: Medicare (Managed Care) | Source: Ambulatory Visit | Attending: Internal Medicine | Admitting: Internal Medicine

## 2022-09-02 DIAGNOSIS — Z48812 Encounter for surgical aftercare following surgery on the circulatory system: Secondary | ICD-10-CM | POA: Diagnosis not present

## 2022-09-02 NOTE — Telephone Encounter (Signed)
Pt wants to go over his sleep study results

## 2022-09-04 ENCOUNTER — Encounter (HOSPITAL_COMMUNITY)
Admission: RE | Admit: 2022-09-04 | Discharge: 2022-09-04 | Disposition: A | Discharge status: Home / Self Care | Payer: Medicare (Managed Care) | Source: Ambulatory Visit | Attending: Internal Medicine | Admitting: Internal Medicine

## 2022-09-04 DIAGNOSIS — Z48812 Encounter for surgical aftercare following surgery on the circulatory system: Secondary | ICD-10-CM | POA: Diagnosis not present

## 2022-09-04 DIAGNOSIS — I214 Non-ST elevation (NSTEMI) myocardial infarction: Secondary | ICD-10-CM

## 2022-09-04 NOTE — Progress Notes (Signed)
Reviewed home exercise Rx with patient today.  Encouraged warm-up, cool-down, and stretching. Reviewed THRR of  56 -111 and keeping RPE between 11-13. Encouraged to hydrate with activity.  Reviewed weather parameters for temperature and humidity for safe exercise outdoors. Reviewed S/S to terminate exercise and when to call 911 vs MD. Reviewed the use of NTG and pt was encouraged to carry at all times. Pt encouraged to always carry a cell phone for safety when exercising outdoors. Pt verbalized understanding of the home exercise Rx and was provided a copy.   Lorin Picket MS, ACSM-CEP, CCRP

## 2022-09-05 ENCOUNTER — Encounter (INDEPENDENT_AMBULATORY_CARE_PROVIDER_SITE_OTHER): Payer: Medicare (Managed Care)

## 2022-09-05 ENCOUNTER — Telehealth: Payer: Self-pay | Admitting: Pulmonary Disease

## 2022-09-05 DIAGNOSIS — R634 Abnormal weight loss: Secondary | ICD-10-CM

## 2022-09-05 DIAGNOSIS — G4733 Obstructive sleep apnea (adult) (pediatric): Secondary | ICD-10-CM | POA: Diagnosis not present

## 2022-09-05 NOTE — Telephone Encounter (Signed)
HST 08/19/22 >> AHI 29.1, SpO2 low 76%.  Central/mixed index 9.9.   Please let him know his sleep study shows he still has moderate to severe complex obstructive sleep apnea.  He should continue using CPAP at night.

## 2022-09-06 ENCOUNTER — Encounter (HOSPITAL_COMMUNITY)
Admission: RE | Admit: 2022-09-06 | Discharge: 2022-09-06 | Disposition: A | Discharge status: Home / Self Care | Payer: Medicare (Managed Care) | Source: Ambulatory Visit | Attending: Internal Medicine | Admitting: Internal Medicine

## 2022-09-06 DIAGNOSIS — Z48812 Encounter for surgical aftercare following surgery on the circulatory system: Secondary | ICD-10-CM | POA: Diagnosis not present

## 2022-09-06 DIAGNOSIS — I214 Non-ST elevation (NSTEMI) myocardial infarction: Secondary | ICD-10-CM

## 2022-09-06 NOTE — Telephone Encounter (Signed)
ATC x1 phone rang a few times never went to voicemail. Will try to contact patient at a later time.

## 2022-09-06 NOTE — Telephone Encounter (Signed)
Pt calling to go over his HST

## 2022-09-09 ENCOUNTER — Encounter (HOSPITAL_COMMUNITY)
Admission: RE | Admit: 2022-09-09 | Discharge: 2022-09-09 | Disposition: A | Discharge status: Home / Self Care | Payer: Medicare (Managed Care) | Source: Ambulatory Visit | Attending: Internal Medicine | Admitting: Internal Medicine

## 2022-09-09 DIAGNOSIS — Z48812 Encounter for surgical aftercare following surgery on the circulatory system: Secondary | ICD-10-CM | POA: Diagnosis not present

## 2022-09-09 DIAGNOSIS — I214 Non-ST elevation (NSTEMI) myocardial infarction: Secondary | ICD-10-CM

## 2022-09-10 NOTE — Progress Notes (Signed)
Cardiac Individual Treatment Plan  Patient Details  Name: Raymond Christensen MRN: [id number] Date of Birth: 1940/04/04 Referring Provider:   Flowsheet Row INTENSIVE CARDIAC Christensen ORIENT from 07/09/2022 in [facility]  Referring Provider [name], MD       Initial Encounter Date:  Flowsheet Row INTENSIVE CARDIAC Christensen ORIENT from 07/09/2022 in [facility]  Date 07/09/22       Visit Diagnosis: 06/25/22 NSTEMI (non-ST elevated myocardial infarction) [facility])  Patient's Home Medications on Admission:  Current Outpatient Medications:    acetaminophen (TYLENOL) 500 MG tablet, Take 1,000 mg by mouth every 6 (six) hours as needed for moderate pain or headache., Disp: , Rfl:    aspirin EC (ASPIR-LOW) 81 MG tablet, Take 81 mg by mouth daily., Disp: , Rfl:    atorvastatin (LIPITOR) 80 MG tablet, Take 1 tablet (80 mg total) by mouth daily., Disp: 90 tablet, Rfl: 3   Cholecalciferol (VITAMIN D3) 250 MCG (10000 UT) capsule, Take 10,000 Units by mouth daily. , Disp: , Rfl:    ELIQUIS 5 MG TABS tablet, TAKE 1 TABLET TWICE A DAY, Disp: 180 tablet, Rfl: 3   ezetimibe (ZETIA) 10 MG tablet, TAKE 1 TABLET DAILY, Disp: 90 tablet, Rfl: 3   famotidine (PEPCID) 20 MG tablet, Take 1 tablet (20 mg total) by mouth daily., Disp: 30 tablet, Rfl: 3   Iron, Ferrous Sulfate, 325 (65 Fe) MG TABS, Take 1 tablet by mouth daily at 6 (six) AM., Disp: , Rfl:    levothyroxine (SYNTHROID) 75 MCG tablet, TAKE 1 TABLET DAILY, Disp: 90 tablet, Rfl: 1   liothyronine (CYTOMEL) 5 MCG tablet, TAKE 2 TABLETS DAILY (Patient taking differently: Take 10 mcg by mouth daily.), Disp: 180 tablet, Rfl: 3   metoprolol succinate (TOPROL-XL) 25 MG 24 hr tablet, Take 0.5 tablets (12.5 mg total) by mouth daily., Disp: 45 tablet, Rfl: 3   Multiple Vitamins-Minerals (MULTIVITAMIN ADULT EXTRA C PO), Take 1 tablet by mouth daily.  , Disp: , Rfl:    nitroGLYCERIN (NITROSTAT) 0.4 MG SL tablet, Place 1 tablet (0.4 mg total) under the tongue every 5 (five) minutes as needed for chest pain. (Patient not taking: Reported on 08/05/2022), Disp: 30 tablet, Rfl: 12   Semaglutide, 2 MG/DOSE, 8 MG/3ML SOPN, Inject 2 mg as directed once a week. (Patient taking differently: Inject 2 mg as directed once a week. Sunday), Disp: 9 mL, Rfl: 2   sertraline (ZOLOFT) 100 MG tablet, Take 0.5 tablets (50 mg total) by mouth daily. (Patient taking differently: Take 100 mg by mouth daily.), Disp: 90 tablet, Rfl: 0   Vibegron (GEMTESA) 75 MG TABS, Take 1 tablet by mouth every other day., Disp: , Rfl:   Past Medical History: Past Medical History:  Diagnosis Date   Arthritis    CAD (coronary artery disease)    Carotid artery disease (HCC)    s/p left CEA 01/17/14   Chronic systolic CHF (congestive heart failure) (HCC)    Concussion Summer 2012   Essential hypertension 02/26/2017   Heart disease    Hyperlipidemia    Hypothyroidism 10/15/2017   Major depressive disorder    Mitral valve regurgitation    Myocardial infarction (HCC) 1996, 2021   Obstructive sleep apnea on CPAP    Persistent atrial fibrillation (HCC)    Poor flexibility of tendon 12/01/2018   S/P mitral valve clip implantation 12/23/2019   s/p TEER with MitraClip with  one XTW by Raymond. Excell Seltzer   Subungual hematoma of digit of hand 12/01/2018   Subungual hematoma of right foot 12/01/2018   Type 2 diabetes mellitus without complication, without long-term current use of insulin (HCC) 02/26/2017    Tobacco Use: Social History   Tobacco Use  Smoking Status Former   Current packs/day: 0.00   Types: Cigarettes   Quit date: 1980   Years since quitting: 44.6  Smokeless Tobacco Never    Labs: Review Flowsheet  More data exists      Latest Ref Rng & Units 08/31/2021 09/30/2021 10/08/2021 05/03/2022 06/26/2022  Labs for ITP Cardiac and Pulmonary Christensen  Cholestrol 0 - 200 mg/dL 960  -  454  - 85   LDL (calc) 0 - 99 mg/dL - - 45  - 34   Direct LDL mg/dL 09.8  - - - -  HDL-C >11 mg/dL 91.47  - 82.95  - 40   Trlycerides <150 mg/dL 621.3  - 086.5  - 53   Hemoglobin A1c 4.8 - 5.6 % 6.4  - - 5.8  5.4   TCO2 22 - 32 mmol/L - 26  - - -    Details            Capillary Blood Glucose: Lab Results  Component Value Date   GLUCAP 82 07/31/2022   GLUCAP 93 07/31/2022   GLUCAP 145 (H) 07/29/2022   GLUCAP 84 07/26/2022   GLUCAP 101 (H) 07/26/2022     Exercise Target Goals: Exercise Program Goal: Individual exercise prescription set using results from initial 6 min walk test and THRR while considering  patient's activity barriers and safety.   Exercise Prescription Goal: Initial exercise prescription builds to 30-45 minutes a day of aerobic activity, 2-3 days per week.  Home exercise guidelines will be given to patient during program as part of exercise prescription that the participant will acknowledge.  Activity Barriers & Risk Stratification:  Activity Barriers & Cardiac Risk Stratification - 07/09/22 1251       Activity Barriers & Cardiac Risk Stratification   Activity Barriers Balance Concerns;History of Falls;Decreased Ventricular Function;Left Hip Replacement;Right Hip Replacement;Deconditioning;Muscular Weakness    Cardiac Risk Stratification High             6 Minute Walk:  6 Minute Walk     Row Name 07/09/22 1057         6 Minute Walk   Phase Initial  Nustep used due to low blood pressure     Distance 1837 feet  Nustep used due to low BP     Walk Time 6 minutes     # of Rest Breaks 0     MPH 3.5     METS 2.9     RPE 11     Perceived Dyspnea  0     VO2 Peak 10.1     Symptoms No     Resting HR 72 bpm     Resting BP 104/61     Resting Oxygen Saturation  97 %     Exercise Oxygen Saturation  during 6 min walk 97 %     Max Ex. HR 105 bpm     Max Ex. BP 114/56     2 Minute Post BP 97/56              Oxygen Initial  Assessment:   Oxygen Re-Evaluation:   Oxygen Discharge (Final Oxygen Re-Evaluation):   Initial Exercise Prescription:  Initial Exercise Prescription - 07/09/22 1300  Date of Initial Exercise RX and Referring Provider   Date 07/09/22    Referring Provider [name], MD    Expected Discharge Date 09/20/22      NuStep   Level 1    SPM 80    Minutes 25    METs 2.9      Prescription Details   Frequency (times per week) 3    Duration Progress to 30 minutes of continuous aerobic without signs/symptoms of physical distress      Intensity   THRR 40-80% of Max Heartrate 56-111    Ratings of Perceived Exertion 11-13    Perceived Dyspnea 0-4      Progression   Progression Continue to progress workloads to maintain intensity without signs/symptoms of physical distress.      Resistance Training   Training Prescription Yes    Weight 3 lbs    Reps 10-15             Perform Capillary Blood Glucose checks as needed.  Exercise Prescription Changes:   Exercise Prescription Changes     Row Name 07/26/22 1400 08/09/22 1400 08/23/22 1500 09/04/22 1600       Response to Exercise   Blood Pressure (Admit) 118/52 118/78 102/60 124/60    Blood Pressure (Exercise) 120/60 124/68 106/62 128/60    Blood Pressure (Exit) 110/60 106/70 110/70 104/62    Heart Rate (Admit) 73 bpm 68 bpm 69 bpm 54 bpm    Heart Rate (Exercise) 89 bpm 97 bpm 96 bpm 103 bpm    Heart Rate (Exit) 77 bpm 75 bpm 76 bpm 67 bpm    Rating of Perceived Exertion (Exercise) 10 11 12 10     Symptoms None None None None    Comments Pt's first day in the CRP2 program Reviewed METs Reviewed METs and goals Reviewed METs and HERX    Duration Progress to 30 minutes of  aerobic without signs/symptoms of physical distress Continue with 30 min of aerobic exercise without signs/symptoms of physical distress. Continue with 30 min of aerobic exercise without signs/symptoms of physical distress. Continue with 30 min  of aerobic exercise without signs/symptoms of physical distress.    Intensity THRR unchanged THRR unchanged THRR unchanged THRR unchanged      Progression   Progression Continue to progress workloads to maintain intensity without signs/symptoms of physical distress. Continue to progress workloads to maintain intensity without signs/symptoms of physical distress. Continue to progress workloads to maintain intensity without signs/symptoms of physical distress. Continue to progress workloads to maintain intensity without signs/symptoms of physical distress.    Average METs 2.2 2.5 2.85 3.1      Resistance Training   Training Prescription Yes Yes Yes No    Weight 3 lbs 3 lbs 3 lbs No weights on wednesdays    Reps 10-15 10-15 10-15 --    Time 10 Minutes 10 Minutes 10 Minutes --      Interval Training   Interval Training No No No No      Recumbant Bike   Level -- -- 2 3    RPM -- -- 82 69    Watts -- -- 28 73    Minutes -- -- 15 15    METs -- -- 2.8 3.2      NuStep   Level 1 2 2 3     SPM 103 116 -- 116    Minutes 25 30 15 15     METs 2.2 2.5 2.9 3      Home  Exercise Plan   Plans to continue exercise at -- -- -- Home (comment)    Frequency -- -- -- Add 2 additional days to program exercise sessions.    Initial Home Exercises Provided -- -- -- 09/04/22             Exercise Comments:   Exercise Comments     Row Name 07/26/22 1416 08/09/22 1424 08/09/22 1427 08/23/22 1500 09/04/22 1616   Exercise Comments Pt's first day in the CRP2 program. Pt exercise with no complaints ansd is off to a good start. Reviewed METs with patient. Pt is making good progress on MET level. Will try patient on recumbent bike next session for 15 minutes. Reviewed METs with patient today, pt is making good progress. Reviewed METs and goals. Pt is making progress and has no voiced complaints with the current exercise Rx. Reviewed METs and home exercise Rx. Pt continues to make good progress on his MET levels.  Pt will begin to do some walking at home. He will start with 10-15 minutes and bulid up to 30 minutes. Pt agrees to walk 2x/week as to reach the goal of 150 minutes of exercise per week.  Pt verbalized understanding of the home exercise Rx and was provided a copy.            Exercise Goals and Review:   Exercise Goals     Row Name 07/09/22 1252             Exercise Goals   Increase Physical Activity Yes       Intervention Provide advice, education, support and counseling about physical activity/exercise needs.;Develop an individualized exercise prescription for aerobic and resistive training based on initial evaluation findings, risk stratification, comorbidities and participant's personal goals.       Expected Outcomes Short Term: Attend Christensen on a regular basis to increase amount of physical activity.;Long Term: Add in home exercise to make exercise part of routine and to increase amount of physical activity.;Long Term: Exercising regularly at least 3-5 days a week.       Increase Strength and Stamina Yes       Intervention Provide advice, education, support and counseling about physical activity/exercise needs.;Develop an individualized exercise prescription for aerobic and resistive training based on initial evaluation findings, risk stratification, comorbidities and participant's personal goals.       Expected Outcomes Short Term: Increase workloads from initial exercise prescription for resistance, speed, and METs.;Short Term: Perform resistance training exercises routinely during Christensen and add in resistance training at home;Long Term: Improve cardiorespiratory fitness, muscular endurance and strength as measured by increased METs and functional capacity ( )       Able to understand and use rate of perceived exertion (RPE) scale Yes       Intervention Provide education and explanation on how to use RPE scale       Expected Outcomes Short Term: Able to use RPE daily in Christensen to  express subjective intensity level;Long Term:  Able to use RPE to guide intensity level when exercising independently       Knowledge and understanding of Target Heart Rate Range (THRR) Yes       Intervention Provide education and explanation of THRR including how the numbers were predicted and where they are located for reference       Expected Outcomes Short Term: Able to state/look up THRR;Short Term: Able to use daily as guideline for intensity in Christensen;Long Term: Able to use THRR to govern intensity  when exercising independently       Understanding of Exercise Prescription Yes       Intervention Provide education, explanation, and written materials on patient's individual exercise prescription       Expected Outcomes Short Term: Able to explain program exercise prescription;Long Term: Able to explain home exercise prescription to exercise independently                Exercise Goals Re-Evaluation :  Exercise Goals Re-Evaluation     Row Name 07/26/22 1412 08/23/22 1500           Exercise Goal Re-Evaluation   Exercise Goals Review Increase Physical Activity;Understanding of Exercise Prescription;Increase Strength and Stamina;Knowledge and understanding of Target Heart Rate Range (THRR);Able to understand and use rate of perceived exertion (RPE) scale Increase Physical Activity;Understanding of Exercise Prescription;Increase Strength and Stamina;Knowledge and understanding of Target Heart Rate Range (THRR);Able to understand and use rate of perceived exertion (RPE) scale      Comments Pt's first day in the CRP2 program. Pt understands the exercise Rx, THRR and RPE scale. Reviewed METs and goals. Pt is making good progress on MET level, peak METs = 2.9.  Pt reports making progress on his goal of increased energy, strength, and stamina.      Expected Outcomes Will continue to montior patient and progress exercise workloads as tolerated. Will continue to montior patient and progress exercise  workloads as tolerated.               Discharge Exercise Prescription (Final Exercise Prescription Changes):  Exercise Prescription Changes - 09/04/22 1600       Response to Exercise   Blood Pressure (Admit) 124/60    Blood Pressure (Exercise) 128/60    Blood Pressure (Exit) 104/62    Heart Rate (Admit) 54 bpm    Heart Rate (Exercise) 103 bpm    Heart Rate (Exit) 67 bpm    Rating of Perceived Exertion (Exercise) 10    Symptoms None    Comments Reviewed METs and HERX    Duration Continue with 30 min of aerobic exercise without signs/symptoms of physical distress.    Intensity THRR unchanged      Progression   Progression Continue to progress workloads to maintain intensity without signs/symptoms of physical distress.    Average METs 3.1      Resistance Training   Training Prescription No    Weight No weights on wednesdays      Interval Training   Interval Training No      Recumbant Bike   Level 3    RPM 69    Watts 73    Minutes 15    METs 3.2      NuStep   Level 3    SPM 116    Minutes 15    METs 3      Home Exercise Plan   Plans to continue exercise at Home (comment)    Frequency Add 2 additional days to program exercise sessions.    Initial Home Exercises Provided 09/04/22             Nutrition:  Target Goals: Understanding of nutrition guidelines, daily intake of sodium 1500mg , cholesterol 200mg , calories 30% from fat and 7% or less from saturated fats, daily to have 5 or more servings of fruits and vegetables.  Biometrics:  Pre Biometrics - 07/09/22 1253       Pre Biometrics   Waist Circumference 44.5 inches    Hip Circumference 47 inches  Waist to Hip Ratio 0.95 %    Triceps Skinfold 14 mm    % Body Fat 32 %    Grip Strength 31 kg    Flexibility 0 in   Cannot reach   Single Leg Stand 1 seconds              Nutrition Therapy Plan and Nutrition Goals:  Nutrition Therapy & Goals - 08/26/22 1312       Nutrition Therapy    Diet Heart Healthy Diet    Drug/Food Interactions Statins/Certain Fruits      Personal Nutrition Goals   Nutrition Goal Patient to identify strategies for reducing cardiovascular risk by attending the [facility] education and nutrition series weekly    Personal Goal #2 Patient to improve diet quality by using the plate method as a guide for meal planning to include lean protein/plant protein, fruits, vegetables, whole grains, nonfat dairy as part of a well-balanced diet.    Comments Goals in action. Raymond Christensen reports no nutrition questions/concerns at this time; he has previously completed cardiac Christensen in 12/2019 and 06/2021. He continues to attend the [facility] education and nutrition series regularly. He reports eating a wide variety of foods and eating three meals daily. His wife is a good support. He is taking Ozempic for weight loss and is down ~40# per documentation on 08/31/2021; he is down an additional 4.6# since starting with our program. Patient will benefit from participation in intensive cardiac Christensen for nutrition, exercise, and lifestyle modification.      Intervention Plan   Intervention Prescribe, educate and counsel regarding individualized specific dietary modifications aiming towards targeted core components such as weight, hypertension, lipid management, diabetes, heart failure and other comorbidities.;Nutrition handout(s) given to patient.    Expected Outcomes Short Term Goal: Understand basic principles of dietary content, such as calories, fat, sodium, cholesterol and nutrients.;Long Term Goal: Adherence to prescribed nutrition plan.             Nutrition Assessments:  MEDIFICTS Score Key: ?70 Need to make dietary changes  40-70 Heart Healthy Diet ? 40 Therapeutic Level Cholesterol Diet   Flowsheet Row CARDIAC Christensen PHASE II EXERCISE from 06/23/2020 in [facility]  Picture Your Plate Total Score on Discharge 64       Picture Your Plate Scores: <28 Unhealthy dietary pattern with much room for improvement. 41-50 Dietary pattern unlikely to meet recommendations for good health and room for improvement. 51-60 More healthful dietary pattern, with some room for improvement.  >60 Healthy dietary pattern, although there may be some specific behaviors that could be improved.    Nutrition Goals Re-Evaluation:  Nutrition Goals Re-Evaluation     Row Name 07/26/22 1402 08/26/22 1312           Goals   Current Weight 209 lb 7 oz (95 kg) 207 lb 7.3 oz (94.1 kg)      Comment lipoproteinA WNL, A1c is well-controlled at 5.4, HDL 40, potassium 3.3 no new labs; most recent labs  lipoproteinA WNL, A1c is well-controlled at 5.4, HDL 40, potassium 3.3      Expected Outcome Goals in action. Raymond Christensen reports no nutrition questions/concerns at this time; he has previously completed cardiac Christensen in 12/2019 and 06/2021. He reports eating a wide variety of foods and eating three meals daily. His wife is a good support. He is taking Ozempic for weight loss and is down ~40# per documentation on 08/31/2021. Patient will benefit  from participation in intensive cardiac Christensen for nutrition, exercise, and lifestyle modification. Goals in action. Raymond Christensen reports no nutrition questions/concerns at this time; he has previously completed cardiac Christensen in 12/2019 and 06/2021. He continues to attend the [facility] education and nutrition series regularly. He reports eating a wide variety of foods and eating three meals daily. His wife is a good support. He is taking Ozempic for weight loss and is down ~40# per documentation on 08/31/2021; he is down an additional 4.6# since starting with our program. Patient will benefit from participation in intensive cardiac Christensen for nutrition, exercise, and lifestyle modification.               Nutrition Goals Re-Evaluation:  Nutrition Goals Re-Evaluation     Row Name 07/26/22 1402 08/26/22 1312            Goals   Current Weight 209 lb 7 oz (95 kg) 207 lb 7.3 oz (94.1 kg)      Comment lipoproteinA WNL, A1c is well-controlled at 5.4, HDL 40, potassium 3.3 no new labs; most recent labs  lipoproteinA WNL, A1c is well-controlled at 5.4, HDL 40, potassium 3.3      Expected Outcome Goals in action. Raymond Christensen reports no nutrition questions/concerns at this time; he has previously completed cardiac Christensen in 12/2019 and 06/2021. He reports eating a wide variety of foods and eating three meals daily. His wife is a good support. He is taking Ozempic for weight loss and is down ~40# per documentation on 08/31/2021. Patient will benefit from participation in intensive cardiac Christensen for nutrition, exercise, and lifestyle modification. Goals in action. Raymond Christensen reports no nutrition questions/concerns at this time; he has previously completed cardiac Christensen in 12/2019 and 06/2021. He continues to attend the [facility] education and nutrition series regularly. He reports eating a wide variety of foods and eating three meals daily. His wife is a good support. He is taking Ozempic for weight loss and is down ~40# per documentation on 08/31/2021; he is down an additional 4.6# since starting with our program. Patient will benefit from participation in intensive cardiac Christensen for nutrition, exercise, and lifestyle modification.               Nutrition Goals Discharge (Final Nutrition Goals Re-Evaluation):  Nutrition Goals Re-Evaluation - 08/26/22 1312       Goals   Current Weight 207 lb 7.3 oz (94.1 kg)    Comment no new labs; most recent labs  lipoproteinA WNL, A1c is well-controlled at 5.4, HDL 40, potassium 3.3    Expected Outcome Goals in action. Raymond Christensen reports no nutrition questions/concerns at this time; he has previously completed cardiac Christensen in 12/2019 and 06/2021. He continues to attend the [facility] education and nutrition series regularly. He reports eating a wide variety of foods and eating three meals daily. His  wife is a good support. He is taking Ozempic for weight loss and is down ~40# per documentation on 08/31/2021; he is down an additional 4.6# since starting with our program. Patient will benefit from participation in intensive cardiac Christensen for nutrition, exercise, and lifestyle modification.             Psychosocial: Target Goals: Acknowledge presence or absence of significant depression and/or stress, maximize coping skills, provide positive support system. Participant is able to verbalize types and ability to use techniques and skills needed for reducing stress and depression.  Initial Review & Psychosocial Screening:  Initial Psych Review & Screening - 07/09/22 1008  Initial Review   Current issues with None Identified;History of Depression      Family Dynamics   Good Support System? Yes   Has spouse and younger son for support   Comments Raymond Christensen denies being depressed. Raymond Christensen takes Zoloft. Pt feels that his medication is working well for him.      Barriers   Psychosocial barriers to participate in program There are no identifiable barriers or psychosocial needs.      Screening Interventions   Interventions Encouraged to exercise    Expected Outcomes Long Term Goal: Stressors or current issues are controlled or eliminated.             Quality of Life Scores:  Quality of Life - 07/09/22 1243       Quality of Life   Select Quality of Life      Quality of Life Scores   Health/Function Pre 19.23 %    Socioeconomic Pre 27.5 %    Psych/Spiritual Pre 22.86 %    Family Pre 27.6 %    GLOBAL Pre 22.91 %            Scores of 19 and below usually indicate a poorer quality of life in these areas.  A difference of  2-3 points is a clinically meaningful difference.  A difference of 2-3 points in the total score of the Quality of Life Index has been associated with significant improvement in overall quality of life, self-image, physical symptoms, and general health in  studies assessing change in quality of life.  PHQ-9: Review Flowsheet  More data exists      07/31/2022 07/09/2022 05/29/2022 02/11/2022 07/27/2021  Depression screen PHQ 2/9  Decreased Interest 0 0 0 0 0  Down, Depressed, Hopeless 0 0 0 2 1  PHQ - 2 Score 0 0 0 2 1  Altered sleeping - 0 0 2 -  Tired, decreased energy - 2 0 3 -  Change in appetite - 1 0 1 -  Feeling bad or failure about yourself  - 1 0 0 -  Trouble concentrating - 0 0 0 -  Moving slowly or fidgety/restless - 0 0 0 -  Suicidal thoughts - 0 0 0 -  PHQ-9 Score - 4 0 8 -  Difficult doing work/chores - Not difficult at all Not difficult at all Not difficult at all -    Details           Interpretation of Total Score  Total Score Depression Severity:  1-4 = Minimal depression, 5-9 = Mild depression, 10-14 = Moderate depression, 15-19 = Moderately severe depression, 20-27 = Severe depression   Psychosocial Evaluation and Intervention:   Psychosocial Re-Evaluation:  Psychosocial Re-Evaluation     Row Name 08/13/22 1546 09/10/22 1551           Psychosocial Re-Evaluation   Current issues with None Identified None Identified      Interventions Encouraged to attend Cardiac Rehabilitation for the exercise Encouraged to attend Cardiac Rehabilitation for the exercise      Continue Psychosocial Services  No Follow up required No Follow up required               Psychosocial Discharge (Final Psychosocial Re-Evaluation):  Psychosocial Re-Evaluation - 09/10/22 1551       Psychosocial Re-Evaluation   Current issues with None Identified    Interventions Encouraged to attend Cardiac Rehabilitation for the exercise    Continue Psychosocial Services  No Follow up required  Vocational Rehabilitation: Provide vocational [facility] assistance to qualifying candidates.   Vocational [facility] Evaluation & Intervention:  Vocational [facility] - 07/09/22 1010       Initial Vocational [facility] Evaluation &  Intervention   Assessment shows need for Vocational Rehabilitation No   Pt is retired.            Education: Education Goals: Education classes will be provided on a weekly basis, covering required topics. Participant will state understanding/return demonstration of topics presented.    Education     Row Name 07/26/22 1200     Education   Cardiac Education Topics Raymond   Psychologist, forensic General Education   General Education Hypertension and Heart Disease   Instruction Review Code 1- Verbalizes Understanding   Class Start Time 1141   Class Stop Time 1217   Class Time Calculation (min) 36 min    Row Name 07/29/22 1400     Education   Cardiac Education Topics Raymond   Psychologist, forensic Exercise Education   Exercise Education Biomechanial Limitations   Instruction Review Code 1- Verbalizes Understanding   Class Start Time 1150   Class Stop Time 1230   Class Time Calculation (min) 40 min    Row Name 07/31/22 1300     Education   Cardiac Education Topics Raymond   Customer service manager   Weekly Topic Comforting Weekend Breakfasts   Instruction Review Code 1- Verbalizes Understanding   Class Start Time 1143   Class Stop Time 1216   Class Time Calculation (min) 33 min    Row Name 08/05/22 1600     Education   Cardiac Education Topics Raymond   Nurse, children's   Educator Dietitian   Select Nutrition   Nutrition Facts on Fat   Instruction Review Code 1- Verbalizes Understanding   Class Start Time 1150   Class Stop Time 1222   Class Time Calculation (min) 32 min    Row Name 08/07/22 1300     Education   Cardiac Education Topics Raymond   Orthoptist   Educator Dietitian   Weekly Topic Fast Evening Meals   Instruction Review Code 1-  Verbalizes Understanding   Class Start Time 1138   Class Stop Time 1216   Class Time Calculation (min) 38 min    Row Name 08/09/22 1300     Education   Cardiac Education Topics Raymond   Licensed conveyancer Nutrition   Nutrition Vitamins and Minerals   Instruction Review Code 1- Verbalizes Understanding   Class Start Time 1145   Class Stop Time 1230   Class Time Calculation (min) 45 min    Row Name 08/12/22 1300     Education   Cardiac Education Topics Raymond   Select Workshops     Workshops   Educator Exercise Physiologist   Select Psychosocial   Psychosocial Workshop Healthy Sleep for a Healthy Heart   Instruction Review Code 1- Verbalizes Understanding   Class Start Time 1148   Class Stop Time 1235   Class Time Calculation (min) 47 min    Row Name 08/14/22 1600  Education   Cardiac Education Topics Raymond   Customer service manager   Weekly Topic International Cuisine- Spotlight on the [facility] [employer]   Instruction Review Code 1- Verbalizes Understanding   Class Start Time 1145   Class Stop Time 1226   Class Time Calculation (min) 41 min    Row Name 08/16/22 1200     Education   Cardiac Education Topics Raymond   Psychologist, forensic Exercise Education   Exercise Education Improving Performance   Instruction Review Code 1- Verbalizes Understanding   Class Start Time 1150    Row Name 08/19/22 1300     Education   Cardiac Education Topics Raymond   Glass blower/designer Nutrition   Nutrition Workshop Fueling a Forensic psychologist   Instruction Review Code 1- TEFL teacher Understanding   Class Start Time 1145   Class Stop Time 1234   Class Time Calculation (min) 49 min    Row Name 08/21/22 1400     Education   Cardiac Education Topics Raymond   Teacher, music   Weekly Topic Simple Sides and Sauces   Instruction Review Code 1- Verbalizes Understanding   Class Start Time 1145   Class Stop Time 1226   Class Time Calculation (min) 41 min    Row Name 08/21/22 1500     Education   Cardiac Education Topics Raymond   Customer service manager   Weekly Topic Simple Sides and Sauces   Instruction Review Code 1- Verbalizes Understanding   Class Start Time 1400   Class Stop Time 1445   Class Time Calculation (min) 45 min    Row Name 08/23/22 1200     Education   Cardiac Education Topics Raymond   Nurse, children's Exercise Physiologist   Select Psychosocial   Psychosocial How Our Thoughts Can Heal Our Hearts   Instruction Review Code 1- Verbalizes Understanding   Class Start Time 1150   Class Stop Time 1230   Class Time Calculation (min) 40 min    Row Name 08/26/22 1300     Education   Cardiac Education Topics Raymond   Select Workshops     Workshops   Educator Exercise Physiologist   Select Exercise   Exercise Workshop Managing Heart Disease: Your Path to a Healthier Heart   Instruction Review Code 1- Verbalizes Understanding   Class Start Time 1147   Class Stop Time 1256   Class Time Calculation (min) 69 min    Row Name 08/28/22 1500     Education   Cardiac Education Topics Raymond   Orthoptist   Educator Dietitian   Weekly Topic Powerhouse Plant-Based Proteins   Instruction Review Code 1- Verbalizes Understanding   Class Start Time 1145   Class Stop Time 1228   Class Time Calculation (min) 43 min    Row Name 08/30/22 1200     Education   Cardiac Education Topics Raymond   Hospital doctor Education   General Education Hypertension and Heart Disease  Instruction Review Code 1- Verbalizes  Understanding   Class Start Time 1200   Class Stop Time 1239   Class Time Calculation (min) 39 min    Row Name 09/02/22 1200     Education   Cardiac Education Topics Raymond   Geographical information systems officer Psychosocial   Psychosocial Workshop From Head to Heart: The Power of a Healthy Outlook   Instruction Review Code 1- Verbalizes Understanding   Class Start Time 1155   Class Stop Time 1245   Class Time Calculation (min) 50 min    Row Name 09/04/22 1300     Education   Cardiac Education Topics Raymond   Customer service manager   Weekly Topic Adding Flavor - Sodium-Free   Instruction Review Code 1- Verbalizes Understanding   Class Start Time 1140   Class Stop Time 1215   Class Time Calculation (min) 35 min    Row Name 09/06/22 1300     Education   Cardiac Education Topics Raymond   Hospital doctor Education   General Education Heart Disease Risk Reduction   Instruction Review Code 1- Verbalizes Understanding   Class Start Time 1146   Class Stop Time 1223   Class Time Calculation (min) 37 min            Core Videos: Exercise    Move It!  Clinical staff conducted group or individual video education with verbal and written material and guidebook.  Patient learns the recommended Raymond exercise program. Exercise with the goal of living a long, healthy life. Some of the health benefits of exercise include controlled diabetes, healthier blood pressure levels, improved cholesterol levels, improved heart and lung capacity, improved sleep, and better body composition. Everyone should speak with their doctor before starting or changing an exercise routine.  Biomechanical Limitations Clinical staff conducted group or individual video education with verbal and written material and guidebook.  Patient learns how  biomechanical limitations can impact exercise and how we can mitigate and possibly overcome limitations to have an impactful and balanced exercise routine.  Body Composition Clinical staff conducted group or individual video education with verbal and written material and guidebook.  Patient learns that body composition (ratio of muscle mass to fat mass) is a key component to assessing overall fitness, rather than body weight alone. Increased fat mass, especially visceral belly fat, can put [place] at increased risk for metabolic syndrome, type 2 diabetes, heart disease, and even death. It is recommended to combine diet and exercise (cardiovascular and resistance training) to improve your body composition. Seek guidance from your physician and exercise physiologist before implementing an exercise routine.  Exercise Action Plan Clinical staff conducted group or individual video education with verbal and written material and guidebook.  Patient learns the recommended strategies to achieve and enjoy long-term exercise adherence, including variety, self-motivation, self-efficacy, and positive decision making. Benefits of exercise include fitness, good health, weight management, more energy, better sleep, less stress, and overall well-being.  Medical   Heart Disease Risk Reduction Clinical staff conducted group or individual video education with verbal and written material and guidebook.  Patient learns our heart is our most vital organ as it circulates oxygen, nutrients, white blood cells, and hormones throughout the entire body, and carries waste away. Data supports a plant-based eating  plan like the Raymond Program for its effectiveness in slowing progression of and reversing heart disease. The video provides a number of recommendations to address heart disease.   Metabolic Syndrome and Belly Fat  Clinical staff conducted group or individual video education with verbal and written material and guidebook.   Patient learns what metabolic syndrome is, how it leads to heart disease, and how one can reverse it and keep it from coming back. You have metabolic syndrome if you have 3 of the following 5 criteria: abdominal obesity, high blood pressure, high triglycerides, low HDL cholesterol, and high blood sugar.  Hypertension and Heart Disease Clinical staff conducted group or individual video education with verbal and written material and guidebook.  Patient learns that high blood pressure, or hypertension, is very common in the [place]. Hypertension is largely due to excessive salt intake, but other important risk factors include being overweight, physical inactivity, drinking too much alcohol, smoking, and not eating enough potassium from fruits and vegetables. High blood pressure is a leading risk factor for heart attack, stroke, congestive heart failure, dementia, kidney failure, and premature death. Long-term effects of excessive salt intake include stiffening of the arteries and thickening of heart muscle and organ damage. Recommendations include ways to reduce hypertension and the risk of heart disease.  Diseases of Our Time - Focusing on Diabetes Clinical staff conducted group or individual video education with verbal and written material and guidebook.  Patient learns why the best way to stop diseases of our time is prevention, through food and other lifestyle changes. Medicine (such as prescription pills and surgeries) is often only a Band-Aid on the problem, not a long-term solution. Most common diseases of our time include obesity, type 2 diabetes, hypertension, heart disease, and cancer. The Raymond Program is recommended and has been proven to help reduce, reverse, and/or prevent the damaging effects of metabolic syndrome.  Nutrition   Overview of the Raymond Eating Plan  Clinical staff conducted group or individual video education with verbal and written material and guidebook.  Patient  learns about the Raymond Eating Plan for disease risk reduction. The Raymond Eating Plan emphasizes a wide variety of unrefined, minimally-processed carbohydrates, like fruits, vegetables, whole grains, and legumes. Go, Caution, and Stop food choices are explained. Plant-based and lean animal proteins are emphasized. Rationale provided for low sodium intake for blood pressure control, low added sugars for blood sugar stabilization, and low added fats and oils for coronary artery disease risk reduction and weight management.  Calorie Density  Clinical staff conducted group or individual video education with verbal and written material and guidebook.  Patient learns about calorie density and how it impacts the Raymond Eating Plan. Knowing the characteristics of the food you choose will help you decide whether those foods will lead to weight gain or weight loss, and whether you want to consume more or less of them. Weight loss is usually a side effect of the Raymond Eating Plan because of its focus on low calorie-dense foods.  Label Reading  Clinical staff conducted group or individual video education with verbal and written material and guidebook.  Patient learns about the Raymond recommended label reading guidelines and corresponding recommendations regarding calorie density, added sugars, sodium content, and whole grains.  Dining Out - Part 1  Clinical staff conducted group or individual video education with verbal and written material and guidebook.  Patient learns that restaurant meals can be sabotaging because they can be so high in calories, fat, sodium, and/or  sugar. Patient learns recommended strategies on how to positively address this and avoid unhealthy pitfalls.  Facts on Fats  Clinical staff conducted group or individual video education with verbal and written material and guidebook.  Patient learns that lifestyle modifications can be just as effective, if not more so, as many  medications for lowering your risk of heart disease. A Raymond lifestyle can help to reduce your risk of inflammation and atherosclerosis (cholesterol build-up, or plaque, in the artery walls). Lifestyle interventions such as dietary choices and physical activity address the cause of atherosclerosis. A review of the types of fats and their impact on blood cholesterol levels, along with dietary recommendations to reduce fat intake is also included.  Nutrition Action Plan  Clinical staff conducted group or individual video education with verbal and written material and guidebook.  Patient learns how to incorporate Raymond recommendations into their lifestyle. Recommendations include planning and keeping personal health goals in mind as an important part of their success.  Healthy Mind-Set    Healthy Minds, Bodies, Hearts  Clinical staff conducted group or individual video education with verbal and written material and guidebook.  Patient learns how to identify when they are stressed. Video will discuss the impact of that stress, as well as the many benefits of stress management. Patient will also be introduced to stress management techniques. The way we think, act, and feel has an impact on our hearts.  How Our Thoughts Can Heal Our Hearts  Clinical staff conducted group or individual video education with verbal and written material and guidebook.  Patient learns that negative thoughts can cause depression and anxiety. This can result in negative lifestyle behavior and serious health problems. Cognitive behavioral therapy is an effective method to help control our thoughts in order to change and improve our emotional outlook.  Additional Videos:  Exercise    Improving Performance  Clinical staff conducted group or individual video education with verbal and written material and guidebook.  Patient learns to use a non-linear approach by alternating intensity levels and lengths of time spent  exercising to help burn more calories and lose more body fat. Cardiovascular exercise helps improve heart health, metabolism, hormonal balance, blood sugar control, and recovery from fatigue. Resistance training improves strength, endurance, balance, coordination, reaction time, metabolism, and muscle mass. Flexibility exercise improves circulation, posture, and balance. Seek guidance from your physician and exercise physiologist before implementing an exercise routine and learn your capabilities and proper form for all exercise.  Introduction to Yoga  Clinical staff conducted group or individual video education with verbal and written material and guidebook.  Patient learns about yoga, a discipline of the coming together of mind, breath, and body. The benefits of yoga include improved flexibility, improved range of motion, better posture and core strength, increased lung function, weight loss, and positive self-image. Yoga's heart health benefits include lowered blood pressure, healthier heart rate, decreased cholesterol and triglyceride levels, improved immune function, and reduced stress. Seek guidance from your physician and exercise physiologist before implementing an exercise routine and learn your capabilities and proper form for all exercise.  Medical   Aging: Enhancing Your Quality of Life  Clinical staff conducted group or individual video education with verbal and written material and guidebook.  Patient learns key strategies and recommendations to stay in good physical health and enhance quality of life, such as prevention strategies, having an advocate, securing a Health Care Proxy and Power of Attorney, and keeping a list of medications and system for tracking  them. It also discusses how to avoid risk for bone loss.  Biology of Weight Control  Clinical staff conducted group or individual video education with verbal and written material and guidebook.  Patient learns that weight gain occurs  because we consume more calories than we burn (eating more, moving less). Even if your body weight is normal, you may have higher ratios of fat compared to muscle mass. Too much body fat puts you at increased risk for cardiovascular disease, heart attack, stroke, type 2 diabetes, and obesity-related cancers. In addition to exercise, following the Raymond Eating Plan can help reduce your risk.  Decoding Lab Results  Clinical staff conducted group or individual video education with verbal and written material and guidebook.  Patient learns that lab test reflects one measurement whose values change over time and are influenced by many factors, including medication, stress, sleep, exercise, food, hydration, pre-existing medical conditions, and more. It is recommended to use the knowledge from this video to become more involved with your lab results and evaluate your numbers to speak with your doctor.   Diseases of Our Time - Overview  Clinical staff conducted group or individual video education with verbal and written material and guidebook.  Patient learns that according to the CDC, 50% to 70% of chronic diseases (such as obesity, type 2 diabetes, elevated lipids, hypertension, and heart disease) are avoidable through lifestyle improvements including healthier food choices, listening to satiety cues, and increased physical activity.  Sleep Disorders Clinical staff conducted group or individual video education with verbal and written material and guidebook.  Patient learns how good quality and duration of sleep are important to overall health and well-being. Patient also learns about sleep disorders and how they impact health along with recommendations to address them, including discussing with a physician.  Nutrition  Dining Out - Part 2 Clinical staff conducted group or individual video education with verbal and written material and guidebook.  Patient learns how to plan ahead and communicate in  order to maximize their dining experience in a healthy and nutritious manner. Included are recommended food choices based on the type of restaurant the patient is visiting.   Fueling a Banker conducted group or individual video education with verbal and written material and guidebook.  There is a strong connection between our food choices and our health. Diseases like obesity and type 2 diabetes are very prevalent and are in large-part due to lifestyle choices. The Raymond Eating Plan provides plenty of food and hunger-curbing satisfaction. It is easy to follow, affordable, and helps reduce health risks.  Menu Workshop  Clinical staff conducted group or individual video education with verbal and written material and guidebook.  Patient learns that restaurant meals can sabotage health goals because they are often packed with calories, fat, sodium, and sugar. Recommendations include strategies to plan ahead and to communicate with the manager, chef, or server to help order a healthier meal.  Planning Your Eating Strategy  Clinical staff conducted group or individual video education with verbal and written material and guidebook.  Patient learns about the Raymond Eating Plan and its benefit of reducing the risk of disease. The Raymond Eating Plan does not focus on calories. Instead, it emphasizes high-quality, nutrient-rich foods. By knowing the characteristics of the foods, we choose, we can determine their calorie density and make informed decisions.  Targeting Your Nutrition Priorities  Clinical staff conducted group or individual video education with verbal and written material and guidebook.  Patient  learns that lifestyle habits have a tremendous impact on disease risk and progression. This video provides eating and physical activity recommendations based on your personal health goals, such as reducing LDL cholesterol, losing weight, preventing or controlling type 2  diabetes, and reducing high blood pressure.  Vitamins and Minerals  Clinical staff conducted group or individual video education with verbal and written material and guidebook.  Patient learns different ways to obtain key vitamins and minerals, including through a recommended healthy diet. It is important to discuss all supplements you take with your doctor.   Healthy Mind-Set    Smoking Cessation  Clinical staff conducted group or individual video education with verbal and written material and guidebook.  Patient learns that cigarette smoking and tobacco addiction pose a serious health risk which affects millions of people. Stopping smoking will significantly reduce the risk of heart disease, lung disease, and many forms of cancer. Recommended strategies for quitting are covered, including working with your doctor to develop a successful plan.  Culinary   Becoming a Set designer conducted group or individual video education with verbal and written material and guidebook.  Patient learns that cooking at home can be healthy, cost-effective, quick, and puts them in control. Keys to cooking healthy recipes will include looking at your recipe, assessing your equipment needs, planning ahead, making it simple, choosing cost-effective seasonal ingredients, and limiting the use of added fats, salts, and sugars.  Cooking - Breakfast and Snacks  Clinical staff conducted group or individual video education with verbal and written material and guidebook.  Patient learns how important breakfast is to satiety and nutrition through the entire day. Recommendations include key foods to eat during breakfast to help stabilize blood sugar levels and to prevent overeating at meals later in the day. Planning ahead is also a key component.  Cooking - Educational psychologist conducted group or individual video education with verbal and written material and guidebook.  Patient learns eating  strategies to improve overall health, including an approach to cook more at home. Recommendations include thinking of animal protein as a side on your plate rather than center stage and focusing instead on lower calorie dense options like vegetables, fruits, whole grains, and plant-based proteins, such as beans. Making sauces in large quantities to freeze for later and leaving the skin on your vegetables are also recommended to maximize your experience.  Cooking - Healthy Salads and Dressing Clinical staff conducted group or individual video education with verbal and written material and guidebook.  Patient learns that vegetables, fruits, whole grains, and legumes are the foundations of the Raymond Eating Plan. Recommendations include how to incorporate each of these in flavorful and healthy salads, and how to create homemade salad dressings. Proper handling of ingredients is also covered. Cooking - Soups and [employer] - Soups and Desserts Clinical staff conducted group or individual video education with verbal and written material and guidebook.  Patient learns that Raymond soups and desserts make for easy, nutritious, and delicious snacks and meal components that are low in sodium, fat, sugar, and calorie density, while high in vitamins, minerals, and filling fiber. Recommendations include simple and healthy ideas for soups and desserts.   Overview     The Raymond Solution Program Overview Clinical staff conducted group or individual video education with verbal and written material and guidebook.  Patient learns that the results of the Raymond Program have been documented in more than 100 articles published in peer-reviewed  journals, and the benefits include reducing risk factors for (and, in some cases, even reversing) high cholesterol, high blood pressure, type 2 diabetes, obesity, and more! An overview of the three key pillars of the Raymond Program will be covered: eating well, doing  regular exercise, and having a healthy mind-set.  WORKSHOPS  Exercise: Exercise Basics: Building Your Action Plan Clinical staff led group instruction and group discussion with PowerPoint presentation and patient guidebook. To enhance the learning environment the use of posters, models and videos may be added. At the conclusion of this workshop, patients will comprehend the difference between physical activity and exercise, as well as the benefits of incorporating both, into their routine. Patients will understand the FITT (Frequency, Intensity, Time, and Type) principle and how to use it to build an exercise action plan. In addition, safety concerns and other considerations for exercise and cardiac Christensen will be addressed by the presenter. The purpose of this lesson is to promote a comprehensive and effective weekly exercise routine in order to improve patients' overall level of fitness.   Managing Heart Disease: Your Path to a Healthier Heart Clinical staff led group instruction and group discussion with PowerPoint presentation and patient guidebook. To enhance the learning environment the use of posters, models and videos may be added.At the conclusion of this workshop, patients will understand the anatomy and physiology of the heart. Additionally, they will understand how Raymond's three pillars impact the risk factors, the progression, and the management of heart disease.  The purpose of this lesson is to provide a high-level overview of the heart, heart disease, and how the Raymond lifestyle positively impacts risk factors.  Exercise Biomechanics Clinical staff led group instruction and group discussion with PowerPoint presentation and patient guidebook. To enhance the learning environment the use of posters, models and videos may be added. Patients will learn how the structural parts of their bodies function and how these functions impact their daily activities, movement, and exercise.  Patients will learn how to promote a neutral spine, learn how to manage pain, and identify ways to improve their physical movement in order to promote healthy living. The purpose of this lesson is to expose patients to common physical limitations that impact physical activity. Participants will learn practical ways to adapt and manage aches and pains, and to minimize their effect on regular exercise. Patients will learn how to maintain good posture while sitting, walking, and lifting.  Balance Training and Fall Prevention  Clinical staff led group instruction and group discussion with PowerPoint presentation and patient guidebook. To enhance the learning environment the use of posters, models and videos may be added. At the conclusion of this workshop, patients will understand the importance of their sensorimotor skills (vision, proprioception, and the vestibular system) in maintaining their ability to balance as they age. Patients will apply a variety of balancing exercises that are appropriate for their current level of function. Patients will understand the common causes for poor balance, possible solutions to these problems, and ways to modify their physical environment in order to minimize their fall risk. The purpose of this lesson is to teach patients about the importance of maintaining balance as they age and ways to minimize their risk of falling.  WORKSHOPS   Nutrition:  Fueling a Ship broker led group instruction and group discussion with PowerPoint presentation and patient guidebook. To enhance the learning environment the use of posters, models and videos may be added. Patients will review the foundational principles of the Raymond  Eating Plan and understand what constitutes a serving size in each of the food groups. Patients will also learn Raymond-friendly foods that are better choices when away from home and review make-ahead meal and snack options. Calorie  density will be reviewed and applied to three nutrition priorities: weight maintenance, weight loss, and weight gain. The purpose of this lesson is to reinforce (in a group setting) the key concepts around what patients are recommended to eat and how to apply these guidelines when away from home by planning and selecting Raymond-friendly options. Patients will understand how calorie density may be adjusted for different weight management goals.  Mindful Eating  Clinical staff led group instruction and group discussion with PowerPoint presentation and patient guidebook. To enhance the learning environment the use of posters, models and videos may be added. Patients will briefly review the concepts of the Raymond Eating Plan and the importance of low-calorie dense foods. The concept of mindful eating will be introduced as well as the importance of paying attention to internal hunger signals. Triggers for non-hunger eating and techniques for dealing with triggers will be explored. The purpose of this lesson is to provide patients with the opportunity to review the basic principles of the Raymond Eating Plan, discuss the value of eating mindfully and how to measure internal cues of hunger and fullness using the Hunger Scale. Patients will also discuss reasons for non-hunger eating and learn strategies to use for controlling emotional eating.  Targeting Your Nutrition Priorities Clinical staff led group instruction and group discussion with PowerPoint presentation and patient guidebook. To enhance the learning environment the use of posters, models and videos may be added. Patients will learn how to determine their genetic susceptibility to disease by reviewing their family history. Patients will gain insight into the importance of diet as part of an overall healthy lifestyle in mitigating the impact of genetics and other environmental insults. The purpose of this lesson is to provide patients with the  opportunity to assess their personal nutrition priorities by looking at their family history, their own health history and current risk factors. Patients will also be able to discuss ways of prioritizing and modifying the Raymond Eating Plan for their highest risk areas  Menu  Clinical staff led group instruction and group discussion with PowerPoint presentation and patient guidebook. To enhance the learning environment the use of posters, models and videos may be added. Using menus brought in from [employer], or printed from [employer], patients will apply the Raymond dining out guidelines that were presented in the [employer] video. Patients will also be able to practice these guidelines in a variety of provided scenarios. The purpose of this lesson is to provide patients with the opportunity to practice hands-on learning of the Raymond Dining Out guidelines with actual menus and practice scenarios.  Label Reading Clinical staff led group instruction and group discussion with PowerPoint presentation and patient guidebook. To enhance the learning environment the use of posters, models and videos may be added. Patients will review and discuss the Raymond label reading guidelines presented in Raymond's Label Reading Educational series video. Using fool labels brought in from local grocery stores and markets, patients will apply the label reading guidelines and determine if the packaged food meet the Raymond guidelines. The purpose of this lesson is to provide patients with the opportunity to review, discuss, and practice hands-on learning of the Raymond Label Reading guidelines with actual packaged food labels. Cooking School  Raymond's [employer]  are designed to teach patients ways to prepare quick, simple, and affordable recipes at home. The importance of nutrition's role in chronic disease risk reduction is reflected in its emphasis in the overall  Raymond program. By learning how to prepare essential core Raymond Eating Plan recipes, patients will increase control over what they eat; be able to customize the flavor of foods without the use of added salt, sugar, or fat; and improve the quality of the food they consume. By learning a set of core recipes which are easily assembled, quickly prepared, and affordable, patients are more likely to prepare more healthy foods at home. These workshops focus on convenient breakfasts, simple entres, side dishes, and desserts which can be prepared with minimal effort and are consistent with nutrition recommendations for cardiovascular risk reduction. Cooking [employer] are taught by a Armed forces logistics/support/administrative officer (RD) who has been trained by the [employer]. The chef or RD has a clear understanding of the importance of minimizing - if not completely eliminating - added fat, sugar, and sodium in recipes. Throughout the series of Cooking School Workshop sessions, patients will learn about healthy ingredients and efficient methods of cooking to build confidence in their capability to prepare    Cooking School weekly topics:  Adding Flavor- Sodium-Free  Fast and Healthy Breakfasts  Powerhouse Plant-Based Proteins  Satisfying Salads and Dressings  Simple Sides and Sauces  International Cuisine-Spotlight on the [employer] Zones  Delicious Desserts  Savory Soups  [employer] - Meals in a Astronomer Appetizers and Snacks  Comforting Weekend Breakfasts  One-Pot Wonders   Fast Evening Meals  Landscape architect Your Raymond Plate  WORKSHOPS   Healthy Mindset (Psychosocial):  Focused Goals, Sustainable Changes Clinical staff led group instruction and group discussion with PowerPoint presentation and patient guidebook. To enhance the learning environment the use of posters, models and videos may be added. Patients will be able to apply effective goal setting strategies  to establish at least one personal goal, and then take consistent, meaningful action toward that goal. They will learn to identify common barriers to achieving personal goals and develop strategies to overcome them. Patients will also gain an understanding of how our mind-set can impact our ability to achieve goals and the importance of cultivating a positive and growth-oriented mind-set. The purpose of this lesson is to provide patients with a deeper understanding of how to set and achieve personal goals, as well as the tools and strategies needed to overcome common obstacles which may arise along the way.  From Head to Heart: The Power of a Healthy Outlook  Clinical staff led group instruction and group discussion with PowerPoint presentation and patient guidebook. To enhance the learning environment the use of posters, models and videos may be added. Patients will be able to recognize and describe the impact of emotions and mood on physical health. They will discover the importance of self-care and explore self-care practices which may work for them. Patients will also learn how to utilize the 4 C's to cultivate a healthier outlook and better manage stress and challenges. The purpose of this lesson is to demonstrate to patients how a healthy outlook is an essential part of maintaining good health, especially as they continue their cardiac Christensen journey.  Healthy Sleep for a Healthy Heart Clinical staff led group instruction and group discussion with PowerPoint presentation and patient guidebook. To enhance the learning environment the use of posters, models and videos may be added. At  the conclusion of this workshop, patients will be able to demonstrate knowledge of the importance of sleep to overall health, well-being, and quality of life. They will understand the symptoms of, and treatments for, common sleep disorders. Patients will also be able to identify daytime and nighttime behaviors which impact  sleep, and they will be able to apply these tools to help manage sleep-related challenges. The purpose of this lesson is to provide patients with a general overview of sleep and outline the importance of quality sleep. Patients will learn about a few of the most common sleep disorders. Patients will also be introduced to the concept of "sleep hygiene," and discover ways to self-manage certain sleeping problems through simple daily behavior changes. Finally, the workshop will motivate patients by clarifying the links between quality sleep and their goals of heart-healthy living.   Recognizing and Reducing Stress Clinical staff led group instruction and group discussion with PowerPoint presentation and patient guidebook. To enhance the learning environment the use of posters, models and videos may be added. At the conclusion of this workshop, patients will be able to understand the types of stress reactions, differentiate between acute and chronic stress, and recognize the impact that chronic stress has on their health. They will also be able to apply different coping mechanisms, such as reframing negative self-talk. Patients will have the opportunity to practice a variety of stress management techniques, such as deep abdominal breathing, progressive muscle relaxation, and/or guided imagery.  The purpose of this lesson is to educate patients on the role of stress in their lives and to provide healthy techniques for coping with it.  Learning Barriers/Preferences:  Learning Barriers/Preferences - 07/09/22 1246       Learning Barriers/Preferences   Learning Barriers Sight;Hearing   wears glasses and hearing aids   Learning Preferences Written Material;Computer/Internet             Education Topics:  Knowledge Questionnaire Score:  Knowledge Questionnaire Score - 07/09/22 1244       Knowledge Questionnaire Score   Pre Score 22/24             Core Components/Risk Factors/Patient Goals at  Admission:  Personal Goals and Risk Factors at Admission - 07/09/22 1244       Core Components/Risk Factors/Patient Goals on Admission   Diabetes Yes    Intervention Provide education about signs/symptoms and action to take for hypo/hyperglycemia.;Provide education about proper nutrition, including hydration, and aerobic/resistive exercise prescription along with prescribed medications to achieve blood glucose in normal ranges: Fasting glucose 65-99 mg/dL    Expected Outcomes Short Term: Participant verbalizes understanding of the signs/symptoms and immediate care of hyper/hypoglycemia, proper foot care and importance of medication, aerobic/resistive exercise and nutrition plan for blood glucose control.;Long Term: Attainment of HbA1C < 7%.    Heart Failure Yes    Intervention Provide a combined exercise and nutrition program that is supplemented with education, support and counseling about heart failure. Directed toward relieving symptoms such as shortness of breath, decreased exercise tolerance, and extremity edema.    Expected Outcomes Improve functional capacity of life;Short term: Attendance in program 2-3 days a week with increased exercise capacity. Reported lower sodium intake. Reported increased fruit and vegetable intake. Reports medication compliance.;Short term: Daily weights obtained and reported for increase. Utilizing diuretic protocols set by physician.;Long term: Adoption of self-care skills and reduction of barriers for early signs and symptoms recognition and intervention leading to self-care maintenance.    Hypertension Yes    Intervention Monitor  prescription use compliance.;Provide education on lifestyle modifcations including regular physical activity/exercise, weight management, moderate sodium restriction and increased consumption of fresh fruit, vegetables, and low fat dairy, alcohol moderation, and smoking cessation.    Expected Outcomes Short Term: Continued assessment and  intervention until BP is < 140/75mm HG in hypertensive participants. < 130/9mm HG in hypertensive participants with diabetes, heart failure or chronic kidney disease.;Long Term: Maintenance of blood pressure at goal levels.    Lipids Yes    Intervention Provide education and support for participant on nutrition & aerobic/resistive exercise along with prescribed medications to achieve LDL 70mg , HDL >40mg .    Expected Outcomes Short Term: Participant states understanding of desired cholesterol values and is compliant with medications prescribed. Participant is following exercise prescription and nutrition guidelines.;Long Term: Cholesterol controlled with medications as prescribed, with individualized exercise RX and with personalized nutrition plan. Value goals: LDL < 70mg , HDL > 40 mg.             Core Components/Risk Factors/Patient Goals Review:   Goals and Risk Factor Review     Row Name 08/13/22 1547 09/10/22 1552           Core Components/Risk Factors/Patient Goals Review   Personal Goals Review Weight Management/Obesity;Lipids;Diabetes;Stress;Hypertension;Heart Failure Weight Management/Obesity;Lipids;Diabetes;Stress;Hypertension;Heart Failure      Review Raymond Christensen is off to a good start to exercise at cardiac Christensen. Blood pressures have improved since Raymond Christensen discontinued Raymond Christensen entresto. Raymond Christensen has lost 2.2 kg since starting cardiac Christensen. Raymond Christensen is doing well with exercise at  cardiac Christensen. Vital signs have been stable. Raymond Christensen has lost 1.2 kg since starting cardiac Christensen      Expected Outcomes Wing will continue to participate in cardiac Christensen for exercise, nutrition and lifestyle modifications Chord will continue to participate in cardiac Christensen for exercise, nutrition and lifestyle modifications               Core Components/Risk Factors/Patient Goals at Discharge (Final Review):   Goals and Risk Factor Review - 09/10/22 1552       Core Components/Risk  Factors/Patient Goals Review   Personal Goals Review Weight Management/Obesity;Lipids;Diabetes;Stress;Hypertension;Heart Failure    Review Ronav is doing well with exercise at  cardiac Christensen. Vital signs have been stable. Donley has lost 1.2 kg since starting cardiac Christensen    Expected Outcomes Truett will continue to participate in cardiac Christensen for exercise, nutrition and lifestyle modifications             ITP Comments:  ITP Comments     Row Name 07/09/22 1610 08/13/22 1545 09/10/22 1550       ITP Comments Armanda Magic, MD: Medical Director.  Introduction to the Raymond Christensen.  Initial orientation packet reviewed. 30 day ITP Review. Raymond Christensen is off to a good start to exercise at cardiac Christensen 30 day ITP Review. Sayid has good attendance and participation with exercise at cardiac Christensen              Comments: See ITP comments.Thayer Headings RN BSN

## 2022-09-11 ENCOUNTER — Encounter (HOSPITAL_COMMUNITY)
Admission: RE | Admit: 2022-09-11 | Discharge: 2022-09-11 | Disposition: A | Discharge status: Home / Self Care | Payer: Medicare (Managed Care) | Source: Ambulatory Visit | Attending: Internal Medicine | Admitting: Internal Medicine

## 2022-09-11 DIAGNOSIS — Z48812 Encounter for surgical aftercare following surgery on the circulatory system: Secondary | ICD-10-CM | POA: Diagnosis not present

## 2022-09-11 DIAGNOSIS — I214 Non-ST elevation (NSTEMI) myocardial infarction: Secondary | ICD-10-CM

## 2022-09-12 NOTE — Telephone Encounter (Signed)
Spoke with pt and notified of results per Dr. Sood. Pt verbalized understanding and denied any questions.  

## 2022-09-12 NOTE — Telephone Encounter (Signed)
Patient has received his results via mychart 09/06/22.

## 2022-09-13 ENCOUNTER — Encounter (HOSPITAL_COMMUNITY)
Admission: RE | Admit: 2022-09-13 | Discharge: 2022-09-13 | Disposition: A | Discharge status: Home / Self Care | Payer: Medicare (Managed Care) | Source: Ambulatory Visit | Attending: Internal Medicine | Admitting: Internal Medicine

## 2022-09-13 DIAGNOSIS — Z48812 Encounter for surgical aftercare following surgery on the circulatory system: Secondary | ICD-10-CM | POA: Diagnosis not present

## 2022-09-13 DIAGNOSIS — I214 Non-ST elevation (NSTEMI) myocardial infarction: Secondary | ICD-10-CM

## 2022-09-18 ENCOUNTER — Encounter (HOSPITAL_COMMUNITY)
Admission: RE | Admit: 2022-09-18 | Discharge: 2022-09-18 | Disposition: A | Discharge status: Home / Self Care | Payer: Medicare (Managed Care) | Source: Ambulatory Visit | Attending: Internal Medicine | Admitting: Internal Medicine

## 2022-09-18 DIAGNOSIS — I252 Old myocardial infarction: Secondary | ICD-10-CM | POA: Diagnosis not present

## 2022-09-18 DIAGNOSIS — E785 Hyperlipidemia, unspecified: Secondary | ICD-10-CM | POA: Insufficient documentation

## 2022-09-18 DIAGNOSIS — I214 Non-ST elevation (NSTEMI) myocardial infarction: Secondary | ICD-10-CM | POA: Diagnosis present

## 2022-09-18 DIAGNOSIS — E669 Obesity, unspecified: Secondary | ICD-10-CM | POA: Insufficient documentation

## 2022-09-18 DIAGNOSIS — I10 Essential (primary) hypertension: Secondary | ICD-10-CM | POA: Diagnosis not present

## 2022-09-18 DIAGNOSIS — E119 Type 2 diabetes mellitus without complications: Secondary | ICD-10-CM | POA: Insufficient documentation

## 2022-09-20 ENCOUNTER — Encounter (HOSPITAL_COMMUNITY)
Admission: RE | Admit: 2022-09-20 | Discharge: 2022-09-20 | Disposition: A | Discharge status: Home / Self Care | Payer: Medicare (Managed Care) | Source: Ambulatory Visit | Attending: Internal Medicine | Admitting: Internal Medicine

## 2022-09-20 DIAGNOSIS — I252 Old myocardial infarction: Secondary | ICD-10-CM | POA: Diagnosis not present

## 2022-09-20 DIAGNOSIS — I214 Non-ST elevation (NSTEMI) myocardial infarction: Secondary | ICD-10-CM

## 2022-09-20 IMAGING — CR DG CHEST 2V
2 series · 2 of 2 positions shown · non-contrast
Comparison: 09/07/2019

CLINICAL DATA: Preop for heart surgery.  Asymptomatic

EXAM:
CHEST - 2 VIEW

[w chest pa]
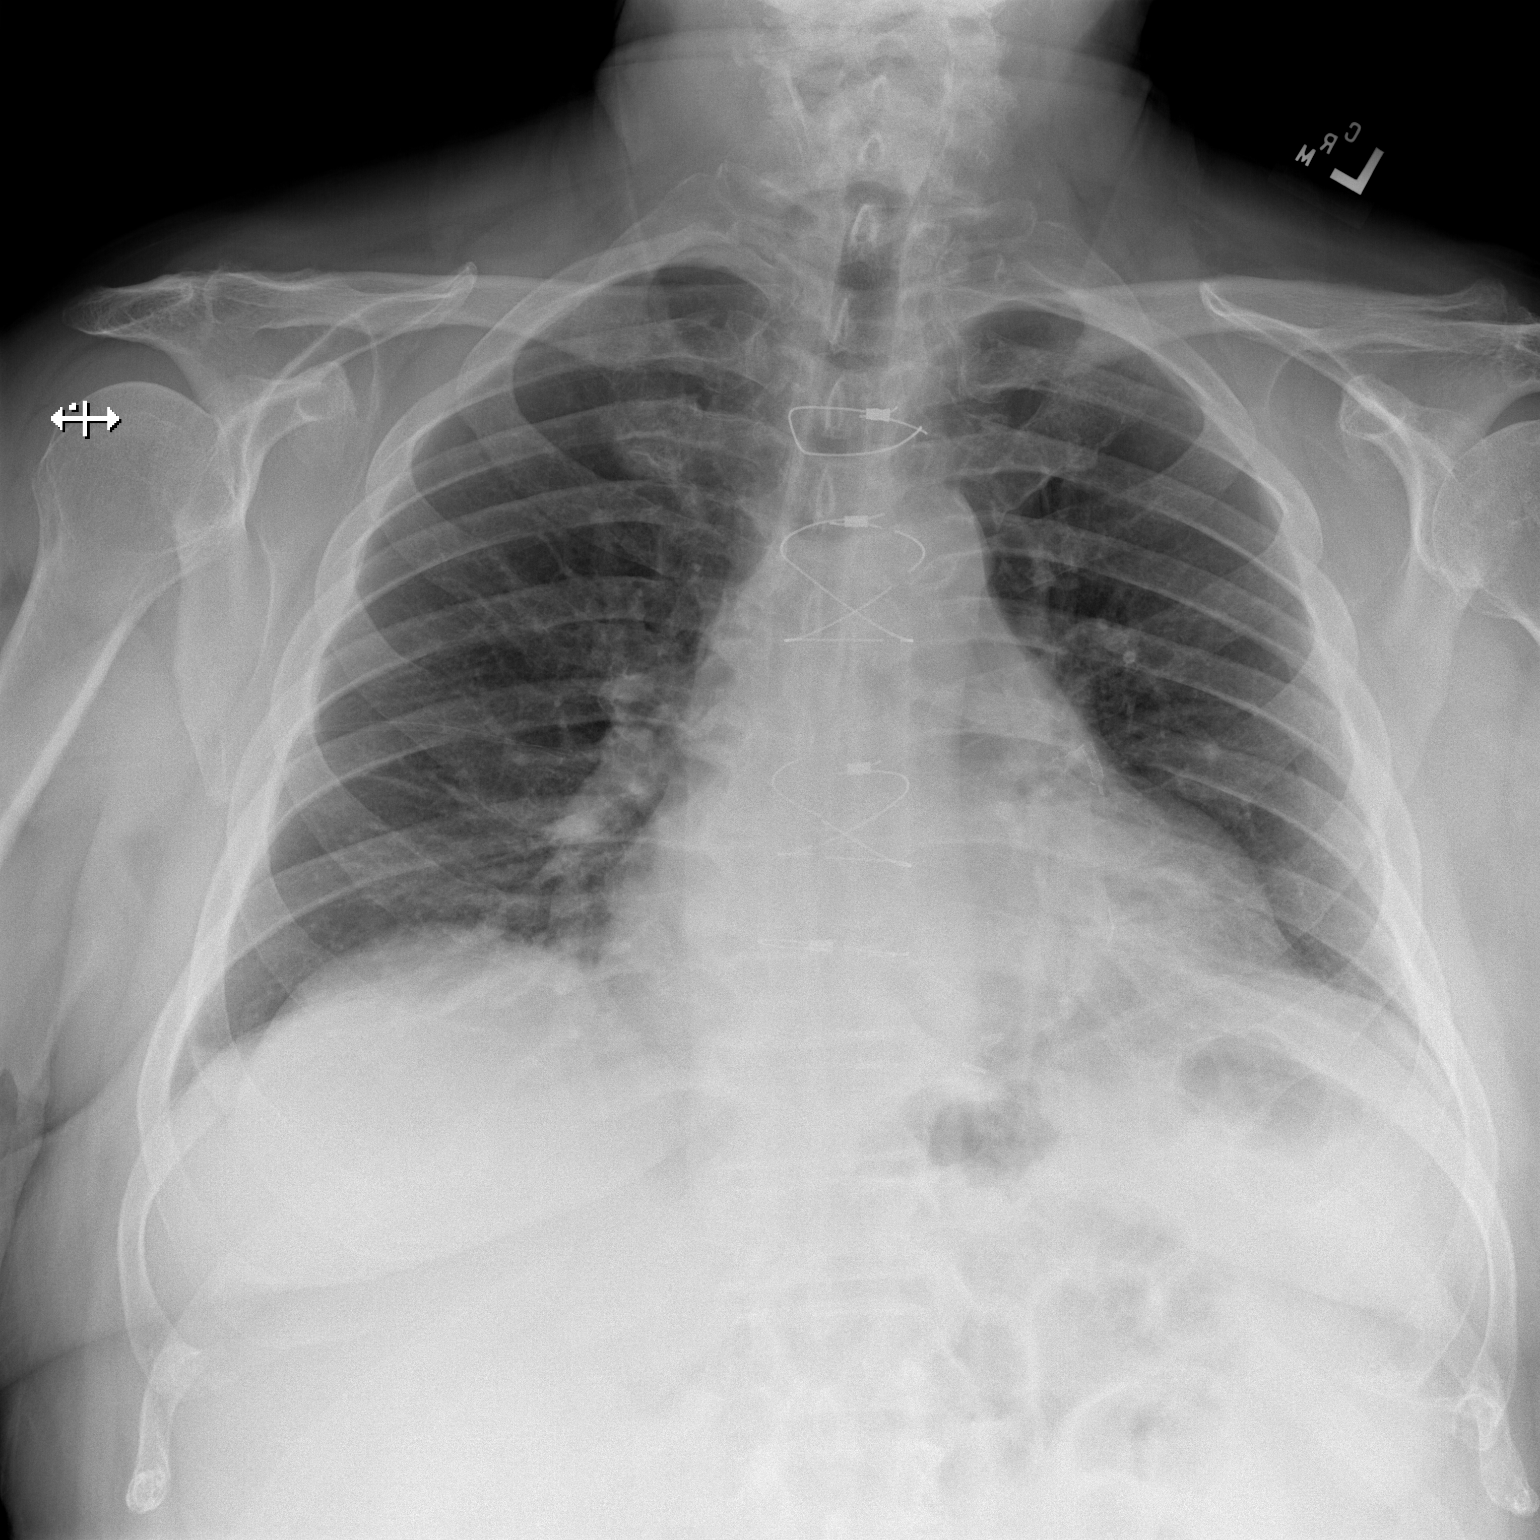

[w chest lat]
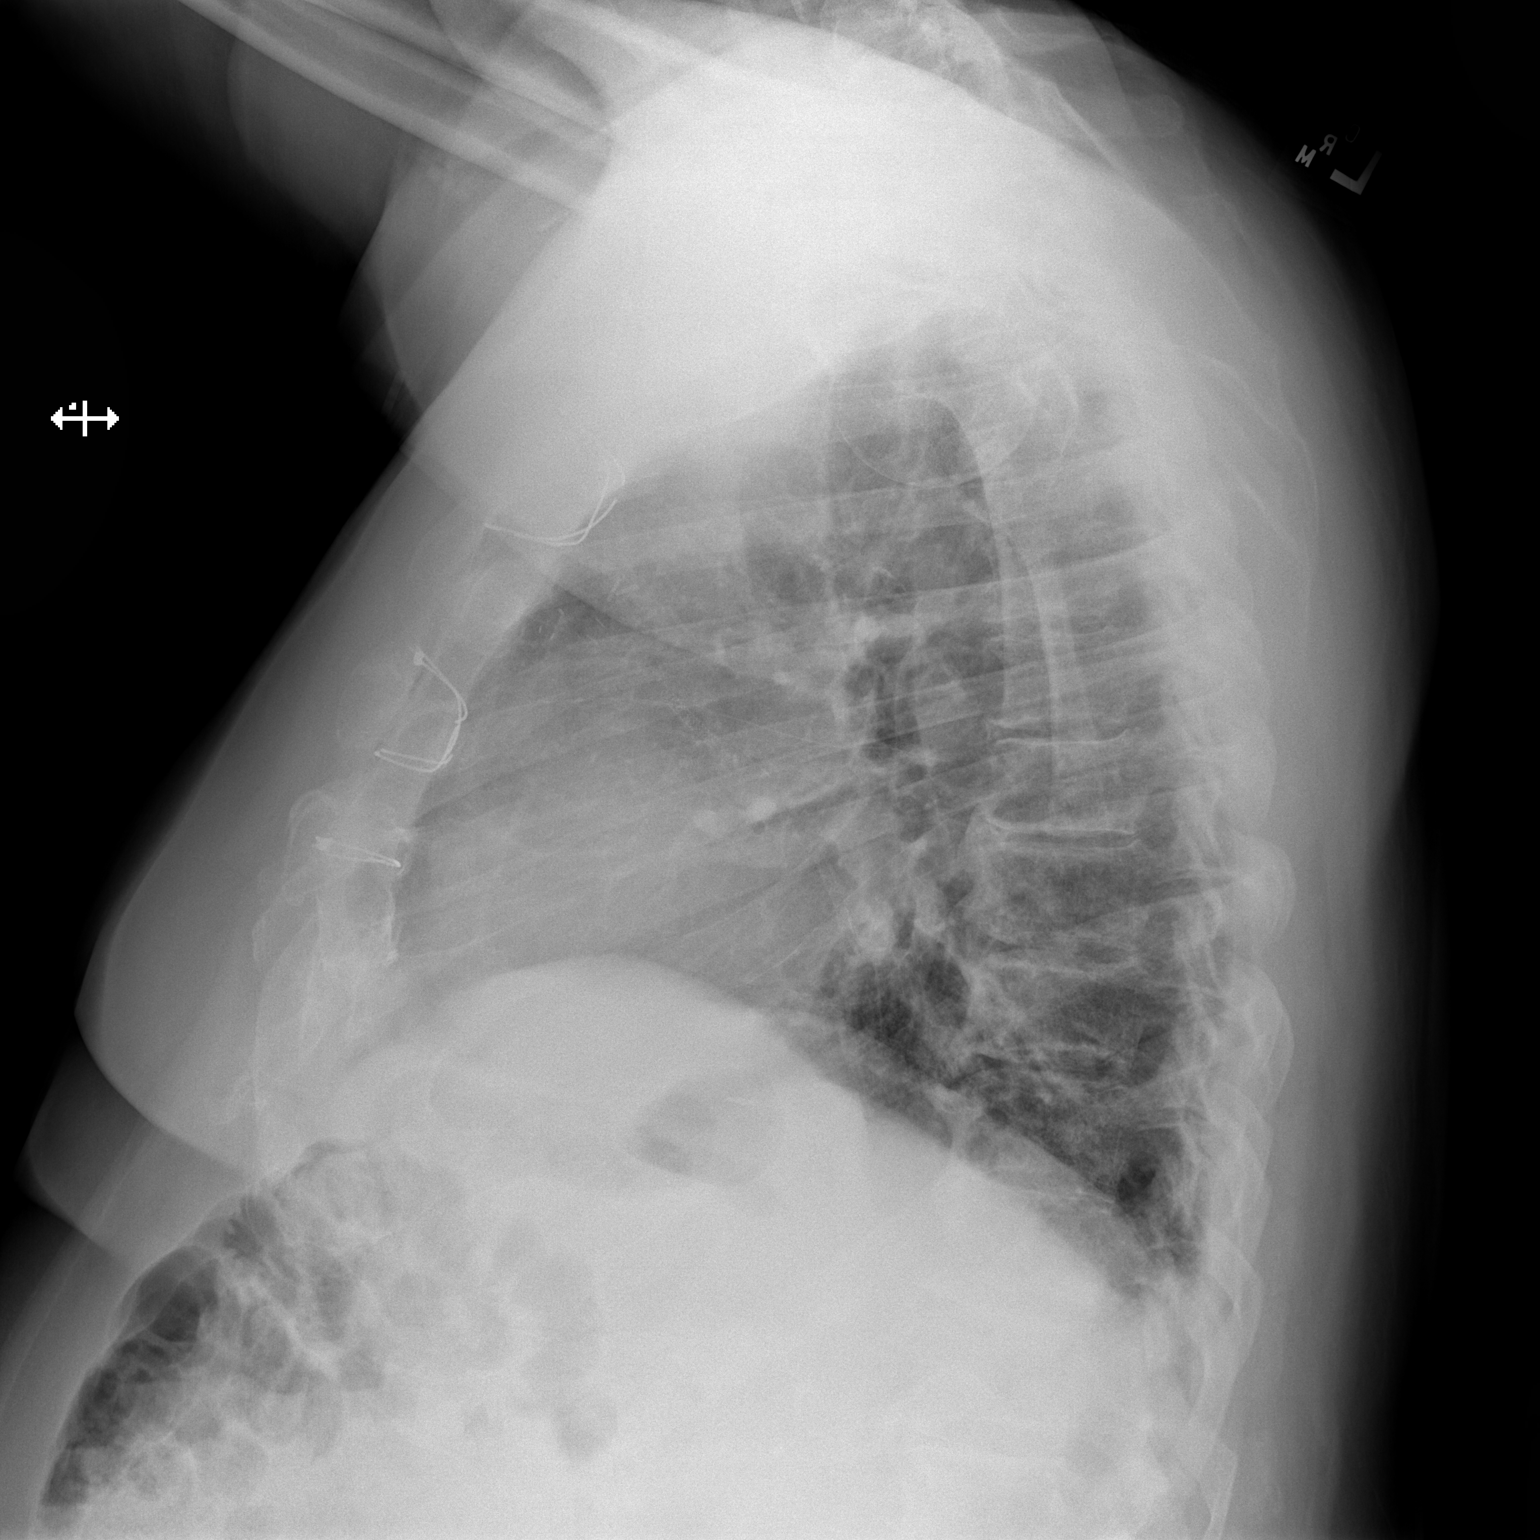

[2 of 2 positions shown; findings below may reference images not displayed]

FINDINGS: Prior median sternotomy, similar fractured wires. Midline trachea.
Borderline cardiomegaly, accentuated by low lung volumes on the
frontal radiograph. No pleural effusion or pneumothorax. No
congestive failure. Mild bibasilar atelectasis.
IMPRESSION: Borderline cardiomegaly, without congestive failure.

Low lung volumes with mild bibasilar atelectasis.

## 2022-09-23 ENCOUNTER — Ambulatory Visit (HOSPITAL_COMMUNITY): Payer: Medicare (Managed Care) | Attending: Physician Assistant

## 2022-09-23 ENCOUNTER — Encounter (HOSPITAL_COMMUNITY): Payer: Medicare (Managed Care)

## 2022-09-23 DIAGNOSIS — I255 Ischemic cardiomyopathy: Secondary | ICD-10-CM | POA: Insufficient documentation

## 2022-09-23 LAB — ECHOCARDIOGRAM COMPLETE
Area-P 1/2: 3.3 cm2
P 1/2 time: 409 ms
S' Lateral: 4.2 cm

## 2022-09-24 ENCOUNTER — Telehealth: Payer: Self-pay | Admitting: Internal Medicine

## 2022-09-24 ENCOUNTER — Telehealth: Payer: Self-pay

## 2022-09-24 NOTE — Telephone Encounter (Signed)
Called pt reports wants to be seen prior to 10/11 to discuss results.   Advised pt Swinyer, NP covering providers basket has resulted echocardiogram.  Is there something from the results that is concerning.  Pt reports wants to meet with a provider face to face to review results.   Also reports wants to have Cardiac Rehab extended.  Advised to discuss this matter with staff at Cardiac Rehab.  The staff with Cardiac rehab will be able to determine if extension is warranted.  Pt expresses understanding.  Advised will send message to provider to see if earlier appointment is warranted and if so where to schedule pt.

## 2022-09-24 NOTE — Telephone Encounter (Signed)
Spoke with patient and he is aware of echo results. He verbalized understanding. He states he will be in cardiac rehab until the end of the month. He would like to know if he should be in cardiac rehab longer?

## 2022-09-24 NOTE — Telephone Encounter (Signed)
Patient is calling because he was wanting to come in person to see Jari Favre before 10/11 to speak with her about his Echo results. Please advise.

## 2022-09-25 ENCOUNTER — Encounter (HOSPITAL_COMMUNITY): Payer: Medicare (Managed Care)

## 2022-09-25 ENCOUNTER — Telehealth: Payer: Self-pay | Admitting: Internal Medicine

## 2022-09-25 ENCOUNTER — Encounter (HOSPITAL_COMMUNITY)
Admission: RE | Admit: 2022-09-25 | Discharge: 2022-09-25 | Disposition: A | Discharge status: Home / Self Care | Payer: Medicare (Managed Care) | Source: Ambulatory Visit | Attending: Internal Medicine

## 2022-09-25 DIAGNOSIS — I4819 Other persistent atrial fibrillation: Secondary | ICD-10-CM

## 2022-09-25 DIAGNOSIS — I252 Old myocardial infarction: Secondary | ICD-10-CM | POA: Diagnosis not present

## 2022-09-25 DIAGNOSIS — I214 Non-ST elevation (NSTEMI) myocardial infarction: Secondary | ICD-10-CM

## 2022-09-25 MED ORDER — APIXABAN 5 MG PO TABS
5.0000 mg | ORAL_TABLET | Freq: Two times a day (BID) | ORAL | 1 refills | Status: DC
Start: 2022-09-25 — End: 2022-12-27

## 2022-09-25 NOTE — Telephone Encounter (Signed)
*  STAT* If patient is at the pharmacy, call can be transferred to refill team.   1. Which medications need to be refilled? (please list name of each medication and dose if known)   ELIQUIS 5 MG TABS tablet    2. Which pharmacy/location (including street and city if local pharmacy) is medication to be sent to? [facility] - [place], [place] - [address] [facility] AT Portal to Registered [facility] Sites    3. Do they need a 30 day or 90 day supply? 90 day

## 2022-09-25 NOTE — Telephone Encounter (Signed)
Refill Request.  

## 2022-09-25 NOTE — Telephone Encounter (Signed)
Prescription refill request for Eliquis received. Indication: Afib  Last office visit: 07/30/22 ([facility])  Scr: 0.80 (06/27/22)  Age: 82 Weight: 94.5kg  Appropriate dose. Refill sent.

## 2022-09-27 ENCOUNTER — Encounter (HOSPITAL_COMMUNITY)
Admission: RE | Admit: 2022-09-27 | Discharge: 2022-09-27 | Disposition: A | Discharge status: Home / Self Care | Payer: Medicare (Managed Care) | Source: Ambulatory Visit | Attending: Internal Medicine | Admitting: Internal Medicine

## 2022-09-27 DIAGNOSIS — I252 Old myocardial infarction: Secondary | ICD-10-CM | POA: Diagnosis not present

## 2022-09-27 DIAGNOSIS — I214 Non-ST elevation (NSTEMI) myocardial infarction: Secondary | ICD-10-CM

## 2022-09-30 ENCOUNTER — Encounter (HOSPITAL_COMMUNITY)
Admission: RE | Admit: 2022-09-30 | Discharge: 2022-09-30 | Disposition: A | Discharge status: Home / Self Care | Payer: Medicare (Managed Care) | Source: Ambulatory Visit | Attending: Internal Medicine | Admitting: Internal Medicine

## 2022-09-30 DIAGNOSIS — I214 Non-ST elevation (NSTEMI) myocardial infarction: Secondary | ICD-10-CM

## 2022-09-30 DIAGNOSIS — I252 Old myocardial infarction: Secondary | ICD-10-CM | POA: Diagnosis not present

## 2022-09-30 NOTE — Telephone Encounter (Signed)
Called pt scheduled OV with Conte on 10/08/22 at 2:45 pm. Pt had no questions at this time.

## 2022-10-02 ENCOUNTER — Encounter (HOSPITAL_COMMUNITY)
Admission: RE | Admit: 2022-10-02 | Discharge: 2022-10-02 | Disposition: A | Discharge status: Home / Self Care | Payer: Medicare (Managed Care) | Source: Ambulatory Visit | Attending: Internal Medicine

## 2022-10-02 DIAGNOSIS — I214 Non-ST elevation (NSTEMI) myocardial infarction: Secondary | ICD-10-CM

## 2022-10-02 DIAGNOSIS — I252 Old myocardial infarction: Secondary | ICD-10-CM | POA: Diagnosis not present

## 2022-10-04 ENCOUNTER — Encounter (HOSPITAL_COMMUNITY)
Admission: RE | Admit: 2022-10-04 | Discharge: 2022-10-04 | Disposition: A | Discharge status: Home / Self Care | Payer: Medicare (Managed Care) | Source: Ambulatory Visit | Attending: Internal Medicine

## 2022-10-04 DIAGNOSIS — I252 Old myocardial infarction: Secondary | ICD-10-CM | POA: Diagnosis not present

## 2022-10-04 DIAGNOSIS — I214 Non-ST elevation (NSTEMI) myocardial infarction: Secondary | ICD-10-CM

## 2022-10-07 ENCOUNTER — Encounter: Payer: Self-pay | Admitting: Family Medicine

## 2022-10-07 ENCOUNTER — Ambulatory Visit: Payer: Medicare (Managed Care) | Admitting: Physician Assistant

## 2022-10-07 ENCOUNTER — Ambulatory Visit: Payer: Medicare (Managed Care) | Admitting: Family Medicine

## 2022-10-07 ENCOUNTER — Encounter (HOSPITAL_COMMUNITY)
Admission: RE | Admit: 2022-10-07 | Discharge: 2022-10-07 | Disposition: A | Discharge status: Home / Self Care | Payer: Medicare (Managed Care) | Source: Ambulatory Visit | Attending: Internal Medicine | Admitting: Internal Medicine

## 2022-10-07 VITALS — BP 122/72 | HR 69 | Temp 98.0°F | Ht 67.5 in | Wt 206.4 lb

## 2022-10-07 DIAGNOSIS — R9430 Abnormal result of cardiovascular function study, unspecified: Secondary | ICD-10-CM

## 2022-10-07 DIAGNOSIS — E669 Obesity, unspecified: Secondary | ICD-10-CM

## 2022-10-07 DIAGNOSIS — E1169 Type 2 diabetes mellitus with other specified complication: Secondary | ICD-10-CM | POA: Diagnosis not present

## 2022-10-07 DIAGNOSIS — J310 Chronic rhinitis: Secondary | ICD-10-CM

## 2022-10-07 DIAGNOSIS — N3090 Cystitis, unspecified without hematuria: Secondary | ICD-10-CM

## 2022-10-07 DIAGNOSIS — I214 Non-ST elevation (NSTEMI) myocardial infarction: Secondary | ICD-10-CM

## 2022-10-07 DIAGNOSIS — I252 Old myocardial infarction: Secondary | ICD-10-CM | POA: Diagnosis not present

## 2022-10-07 MED ORDER — FLUTICASONE PROPIONATE 50 MCG/ACT NA SUSP
2.0000 | Freq: Every day | NASAL | 6 refills | Status: DC
Start: 2022-10-07 — End: 2022-12-17

## 2022-10-07 MED ORDER — LEVOFLOXACIN 500 MG PO TABS
500.0000 mg | ORAL_TABLET | Freq: Every day | ORAL | 0 refills | Status: AC
Start: 2022-10-07 — End: 2022-10-21

## 2022-10-07 NOTE — Patient Instructions (Signed)
We will be in touch with your results.   I personally don't see a whole lot with your echo that would change your plan for now, but will fully defer to your cardiology team.   Keep the diet clean and stay active.  Stay hydrated.   Let us know if you need anything.

## 2022-10-07 NOTE — Progress Notes (Signed)
Chief Complaint  Patient presents with   Results    Subjective: Patient is a 82 y.o. male here for review of echo.  Echo - Patient had an echocardiogram done on 09/23/2022 through the cardiology team for ischemic cardiomyopathy.  It did show mildly depressed ejection fraction and grade 2 diastolic dysfunction.  There is some dilated left and right atria and hypokinesia.  Clinically, he is not having any chest pain, shortness of breath, swelling, or coughing.  He is working with cardiac rehab.  UTI Possible UTI for 2-3 weeks. Burning w urination, freq, poor flow, bleeding, pain/pressure over the perennial region, urgency, retention, hesitancy. No new sexual partners, fevers, abd pain. He is circumcised.  He has had 1 UTI in his life.  Over the past 2 months, the patient has had a runny nose of clear rhinorrhea and thickening/fullness in his throat where he needs to clear it.  Slight congestion.  No sinus TTP, fevers, sore throat, wheezing, shortness of breath, coughing, ear pain/drainage, or itchy/watery eyes.  He is sneezing more.  He has been using Allegra at home with some relief.  No close contacts with similar symptoms.  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 Excell Seltzer   Subungual hematoma of digit of hand 12/01/2018   Subungual hematoma of right foot 12/01/2018   Type 2 diabetes mellitus without  complication, without long-term current use of insulin (HCC) 02/26/2017    Objective: BP 122/72 (BP Location: Left Arm, Patient Position: Sitting, Cuff Size: Normal)   Pulse 69   Temp 98 F (36.7 C) (Oral)   Ht 5' 7.5" (1.715 m)   Wt 206 lb 6 oz (93.6 kg)   SpO2 97%   BMI 31.85 kg/m  General: Awake, appears stated age Heart: RRR, no LE edema Lungs: CTAB, no rales, wheezes or rhonchi. No accessory muscle use Mouth: MMM, no pharyngeal exudate or erythema.  Clear drainage in the pharynx. Nose: There is a patent without rhinorrhea, no sinus TTP Ears: TMs negative bilaterally, canals are patent without otorrhea Abdomen: Bowel sounds present, soft, nontender, nondistended Psych: Age appropriate judgment and insight, normal affect and mood  Assessment and Plan: Cystitis - Plan: levofloxacin (LEVAQUIN) 500 MG tablet, Urine Culture, Urine Microscopic Only  Abnormal result of cardiovascular function study, unspecified  Rhinitis, unspecified type - Plan: fluticasone (FLONASE) 50 MCG/ACT nasal spray  Type 2 diabetes mellitus with obesity (HCC) - Plan: Microalbumin / creatinine urine ratio  Could be prostatitis.  14 days of Levaquin 500 mg daily.  Check culture and microscopy.  Stay hydrated. Reviewed his echo with him and his wife today.  Based off of the findings and his clinical status, I do not think there will be many changes in his cardiology plan.  I also told him that I would defer to anything the cardiology team told  him regarding his results. Could be allergic rhinitis.  Add Flonase to the [employer].  He will let me know if anything changes. Check microalbumin creatinine ratio as well. The patient and his spouse voiced understanding and agreement to the plan.  I spent 43 minutes with the patient discussing the above plans in addition to reviewing his chart on the same day of the visit.  Raymond Roche [place], DO 10/07/22  4:45 PM

## 2022-10-08 ENCOUNTER — Encounter: Payer: Self-pay | Admitting: Physician Assistant

## 2022-10-08 ENCOUNTER — Ambulatory Visit: Payer: Medicare (Managed Care) | Attending: Physician Assistant | Admitting: Physician Assistant

## 2022-10-08 VITALS — BP 122/60 | HR 72 | Ht 67.5 in | Wt 205.6 lb

## 2022-10-08 DIAGNOSIS — I5022 Chronic systolic (congestive) heart failure: Secondary | ICD-10-CM

## 2022-10-08 DIAGNOSIS — I493 Ventricular premature depolarization: Secondary | ICD-10-CM

## 2022-10-08 DIAGNOSIS — I480 Paroxysmal atrial fibrillation: Secondary | ICD-10-CM

## 2022-10-08 DIAGNOSIS — I059 Rheumatic mitral valve disease, unspecified: Secondary | ICD-10-CM

## 2022-10-08 DIAGNOSIS — I4819 Other persistent atrial fibrillation: Secondary | ICD-10-CM

## 2022-10-08 DIAGNOSIS — E039 Hypothyroidism, unspecified: Secondary | ICD-10-CM

## 2022-10-08 DIAGNOSIS — I25110 Atherosclerotic heart disease of native coronary artery with unstable angina pectoris: Secondary | ICD-10-CM

## 2022-10-08 DIAGNOSIS — I10 Essential (primary) hypertension: Secondary | ICD-10-CM

## 2022-10-08 LAB — URINALYSIS, MICROSCOPIC ONLY

## 2022-10-08 LAB — URINE CULTURE
MICRO NUMBER:: 15502316
Result:: NO GROWTH
SPECIMEN QUALITY:: ADEQUATE

## 2022-10-08 LAB — MICROALBUMIN / CREATININE URINE RATIO
Creatinine,U: 126.7 mg/dL
Microalb Creat Ratio: 2.7 mg/g (ref 0.0–30.0)
Microalb, Ur: 3.4 mg/dL — ABNORMAL HIGH (ref 0.0–1.9)

## 2022-10-08 NOTE — Progress Notes (Signed)
Cardiology Office Note:  .   Date:  10/08/2022  ID:  Raymond Christensen, DOB 05/12/1940, MRN [id number] PCP: Raymond Dory, DO  [facility] Providers Cardiologist:  Raymond Constant, MD Electrophysiologist:  Raymond Lemming, MD {  History of Present Illness: .   Raymond Christensen is a 82 y.o. male with a past medical history of atrial fibrillation, elevated troponin and new reduced EF 35 to 40% 08/2019, moderate to severe MR, LV thrombus on TEE, recent PCI and stent with SVG to circumflex here for follow-up appointment.  History includes MI back in 1994 status post cardiac arrest with lytics and PTCI, CABG 2001 (LIMA to LAD, SVG to OM 2, SVG to right PDA, SVG to D2) with postop A-fib, carotid stenosis status post L CEA and right subclavian stenosis, diet-controlled DM, hypertension, obesity, OSA on CPAP, mild cognitive impairment, and depression/mood disorder.  History of MitraClip 12/2019, history of AF ablation 01/2020.  Recent new onset bleeding on Eliquis/Plavix with Plavix DC 08/2020.  Patient was seen 05/31/2022 and was doing well at that time other than some fatigue.  He lives a rather sedentary life.  Wife notes that he naps more.  No interval hospital/ED visits at that appointment. On zoloft 50mg  daily.  No chest pain/pressure.  No DOE/SOB.  He was seen by me 07/04/2022, he had chest pain on 6/11 and was in the ER. Troponin was 1,478. Medical management was suggested. Should be on ASA per patient. His BP is very low today and I asked him to hold [facility] for a few days.  It did come up to 100/60.  He did not eat or drink much this morning.  Asked to track his blood pressure an hour after morning medications.  Will be due for an echocardiogram in September.  He was seen by Raymond Searing, NP and blood pressure was well-controlled at that time.  Compliant with medications.  He was anticipating resuming cardiac rehab and was cleared to do so.  Reported not staying hydrated only  drinking about 20 to 30 ounces of water daily.  Denied chest pain, palpitations, dyspnea, PND, orthopnea, nausea, vomiting, dizziness, syncope, edema, weight gain, early satiety.  Today, he presents to the office to review his echocardiogram.  Reviewed in great detail.  No medication changes recommended at this time.  We will need to keep a close eye on his mitral valve.  He is status post MitraClip but valve has a mild to moderate leak.  Tolerating medications.  Blood pressure is better controlled today.  Reviewed recent lab work and his lipid panel is at goal.  Reports no shortness of breath nor dyspnea on exertion. Reports no chest pain, pressure, or tightness. No edema, orthopnea, PND. Reports no palpitations.   ROS: Refer to HPI for pertinent ROS  Studies Reviewed: .       Cardiac catheterization 06/26/2022 Left Main  Mid LM to Dist LM lesion is 70% stenosed.    Left Anterior Descending  Mid LAD lesion is 75% stenosed.    First Diagonal Branch  1st Diag lesion is 40% stenosed.    Second Diagonal Branch  2nd Diag lesion is 75% stenosed.    Left Circumflex    First Obtuse Marginal Branch  1st Mrg lesion is 75% stenosed.    Second Obtuse Marginal Branch  2nd Mrg-1 lesion is 80% stenosed.  2nd Mrg-2 lesion is 100% stenosed.    Right Coronary Artery  Prox RCA to Mid RCA lesion is 90% stenosed.  Mid RCA to Dist RCA lesion is 100% stenosed.    LIMA Graft To Dist LAD    Sequential Graft To 2nd Mrg, RPDA  Prox Graft to Mid Graft lesion before 2nd Mrg is 95% stenosed.  Dist Graft to Insertion lesion before 2nd Mrg is 85% stenosed. The lesion was previously treated .  Prox Graft to Mid Graft lesion between 2nd Mrg and RPDA is 100% stenosed. The lesion was previously treated .  Dist Graft to Insertion lesion between 2nd Mrg and RPDA is 100% stenosed.    Intervention   No interventions have been documented.   Left Heart  Left Ventricle There is moderate left ventricular  systolic dysfunction. LV end diastolic pressure is mildly elevated. The left ventricular ejection fraction is 35-45% by visual estimate. There are LV function abnormalities. LVEDP 17 mmHg   Coronary Diagrams  Diagnostic Dominance: Right  Intervention      Physical Exam:   VS:  There were no vitals taken for this visit.   Wt Readings from Last 3 Encounters:  10/07/22 206 lb 6 oz (93.6 kg)  08/05/22 208 lb 6.4 oz (94.5 kg)  07/31/22 208 lb 12.8 oz (94.7 kg)    GEN: Well nourished, well developed in no acute distress NECK: No JVD; No carotid bruits CARDIAC: IRIR, no murmurs, rubs, gallops RESPIRATORY:  Clear to auscultation without rales, wheezing or rhonchi  ABDOMEN: Soft, non-tender, non-distended EXTREMITIES:  No edema; No deformity   ASSESSMENT AND PLAN: .   1.  NSTEMI/CAD status post CABG and PCI -No chest pain -Continue current medications including Eliquis 5 mg twice a day, Lipitor 80 mg daily, Zetia 10 mg daily, Imdur 15 mg daily, metoprolol succinate 12.5 mg daily, nitro as needed,  -on ASA 81mg  -BP 122/60   2.  Chronic HFrEF -no edema, euvolemic on exam -Holding Entresto at the moment for hypotension -echo reviewed with the patient without major changes -Continue heart healthy, low-sodium diet  3.  Paroxysmal atrial fibrillation status post ablation/frequent PACs/PVCs -no issues -he does not feel them (PVC) -No further workup at this time needed  4.  Mitral regurgitation status post MitraClip 12/2019 -no dizziness, no SOB -rare lightheadedness -Most recent echocardiogram reviewed with the patient today, mild to moderate MR  5.  Hypertension -Blood pressure looks better today 122/60 -Maintain proper hydration, 64 ounces daily as well as proper oral intake -Patient has been on Ozempic and lost around 40 pounds over the last couple of months.  6.  Hyperlipidemia -LDL 34, at goal -Continue current medication regimen  7.  Type 2 diabetes mellitus -A1C  5.4  8.  Hypothyroidism -TSH 2.0       Dispo: He can follow-up with myself or Dr. Izora Christensen in 3 months  Signed, Raymond Dory, PA-C

## 2022-10-08 NOTE — Progress Notes (Signed)
Cardiac Individual Treatment Plan  Patient Details  Name: Raymond Christensen MRN: [id number] Date of Birth: [birth year] Referring Provider:   Flowsheet Row INTENSIVE CARDIAC REHAB ORIENT from 07/09/2022 in [facility]  Referring Provider [name], MD       Initial Encounter Date:  Flowsheet Row INTENSIVE CARDIAC REHAB ORIENT from 07/09/2022 in [facility]  Date 07/09/22       Visit Diagnosis: 06/25/22 NSTEMI (non-ST elevated myocardial infarction) [facility])  Patient's Home Medications on Admission:  Current Outpatient Medications:    acetaminophen (TYLENOL) 500 MG tablet, Take 1,000 mg by mouth every 6 (six) hours as needed for moderate pain or headache., Disp: , Rfl:    apixaban (ELIQUIS) 5 MG TABS tablet, Take 1 tablet (5 mg total) by mouth 2 (two) times daily., Disp: 180 tablet, Rfl: 1   aspirin EC (ASPIR-LOW) 81 MG tablet, Take 81 mg by mouth daily., Disp: , Rfl:    atorvastatin (LIPITOR) 80 MG tablet, Take 1 tablet (80 mg total) by mouth daily., Disp: 90 tablet, Rfl: 3   Cholecalciferol (VITAMIN D3) 250 MCG (10000 UT) capsule, Take 10,000 Units by mouth daily. , Disp: , Rfl:    ezetimibe (ZETIA) 10 MG tablet, TAKE 1 TABLET DAILY, Disp: 90 tablet, Rfl: 3   famotidine (PEPCID) 20 MG tablet, Take 1 tablet (20 mg total) by mouth daily., Disp: 30 tablet, Rfl: 3   fluticasone (FLONASE) 50 MCG/ACT nasal spray, Place 2 sprays into both nostrils daily., Disp: 16 g, Rfl: 6   Iron, Ferrous Sulfate, 325 (65 Fe) MG TABS, Take 1 tablet by mouth daily at 6 (six) AM., Disp: , Rfl:    levofloxacin (LEVAQUIN) 500 MG tablet, Take 1 tablet (500 mg total) by mouth daily for 14 days., Disp: 14 tablet, Rfl: 0   levothyroxine (SYNTHROID) 75 MCG tablet, TAKE 1 TABLET DAILY, Disp: 90 tablet, Rfl: 1   liothyronine (CYTOMEL) 5 MCG tablet, TAKE 2 TABLETS DAILY (Patient taking differently:  Take 10 mcg by mouth daily.), Disp: 180 tablet, Rfl: 3   metoprolol succinate (TOPROL-XL) 25 MG 24 hr tablet, Take 0.5 tablets (12.5 mg total) by mouth daily., Disp: 45 tablet, Rfl: 3   Multiple Vitamins-Minerals (MULTIVITAMIN ADULT EXTRA C PO), Take 1 tablet by mouth daily. , Disp: , Rfl:    nitroGLYCERIN (NITROSTAT) 0.4 MG SL tablet, Place 1 tablet (0.4 mg total) under the tongue every 5 (five) minutes as needed for chest pain., Disp: 30 tablet, Rfl: 12   Semaglutide, 2 MG/DOSE, 8 MG/3ML SOPN, Inject 2 mg as directed once a week. (Patient taking differently: Inject 2 mg as directed once a week. Sunday), Disp: 9 mL, Rfl: 2   sertraline (ZOLOFT) 100 MG tablet, Take 0.5 tablets (50 mg total) by mouth daily. (Patient taking differently: Take 100 mg by mouth daily.), Disp: 90 tablet, Rfl: 0  Past Medical History: Past Medical History:  Diagnosis Date   Arthritis    CAD (coronary artery disease)    Carotid artery disease (HCC)    s/p left CEA 01/17/14   Chronic systolic CHF (congestive heart failure) (HCC)    Concussion Summer 2012   Essential hypertension 02/26/2017   Heart disease    Hyperlipidemia    Hypothyroidism 10/15/2017   Major depressive disorder    Mitral valve regurgitation    Myocardial infarction (HCC) 1996, 2021   Obstructive sleep apnea on CPAP    Persistent  atrial fibrillation (HCC)    Poor flexibility of tendon 12/01/2018   S/P mitral valve clip implantation 12/23/2019   s/p TEER with MitraClip with one XTW by Dr. Excell Seltzer   Subungual hematoma of digit of hand 12/01/2018   Subungual hematoma of right foot 12/01/2018   Type 2 diabetes mellitus without complication, without long-term current use of insulin (HCC) 02/26/2017    Tobacco Use: Social History   Tobacco Use  Smoking Status Former   Current packs/day: 0.00   Types: Cigarettes   Quit date: 1980   Years since quitting: 44.7  Smokeless Tobacco Never    Labs: Review Flowsheet  More data exists      Latest  Ref Rng & Units 08/31/2021 09/30/2021 10/08/2021 05/03/2022 06/26/2022  Labs for ITP Cardiac and Pulmonary Rehab  Cholestrol 0 - 200 mg/dL 409  - 811  - 85   LDL (calc) 0 - 99 mg/dL - - 45  - 34   Direct LDL mg/dL 91.4  - - - -  HDL-C >78 mg/dL 29.56  - 21.30  - 40   Trlycerides <150 mg/dL 865.7  - 846.9  - 53   Hemoglobin A1c 4.8 - 5.6 % 6.4  - - 5.8  5.4   TCO2 22 - 32 mmol/L - 26  - - -    Details            Capillary Blood Glucose: Lab Results  Component Value Date   GLUCAP 82 07/31/2022   GLUCAP 93 07/31/2022   GLUCAP 145 (H) 07/29/2022   GLUCAP 84 07/26/2022   GLUCAP 101 (H) 07/26/2022     Exercise Target Goals: Exercise Program Goal: Individual exercise prescription set using results from initial 6 min walk test and THRR while considering  patient's activity barriers and safety.   Exercise Prescription Goal: Initial exercise prescription builds to 30-45 minutes a day of aerobic activity, 2-3 days per week.  Home exercise guidelines will be given to patient during program as part of exercise prescription that the participant will acknowledge.  Activity Barriers & Risk Stratification:  Activity Barriers & Cardiac Risk Stratification - 07/09/22 1251       Activity Barriers & Cardiac Risk Stratification   Activity Barriers Balance Concerns;History of Falls;Decreased Ventricular Function;Left Hip Replacement;Right Hip Replacement;Deconditioning;Muscular Weakness    Cardiac Risk Stratification High             6 Minute Walk:  6 Minute Walk     Row Name 07/09/22 1057         6 Minute Walk   Phase Initial  Nustep used due to low blood pressure     Distance 1837 feet  Nustep used due to low BP     Walk Time 6 minutes     # of Rest Breaks 0     MPH 3.5     METS 2.9     RPE 11     Perceived Dyspnea  0     VO2 Peak 10.1     Symptoms No     Resting HR 72 bpm     Resting BP 104/61     Resting Oxygen Saturation  97 %     Exercise Oxygen Saturation  during 6  min walk 97 %     Max Ex. HR 105 bpm     Max Ex. BP 114/56     2 Minute Post BP 97/56  Oxygen Initial Assessment:   Oxygen Re-Evaluation:   Oxygen Discharge (Final Oxygen Re-Evaluation):   Initial Exercise Prescription:  Initial Exercise Prescription - 07/09/22 1300       Date of Initial Exercise RX and Referring Provider   Date 07/09/22    Referring Provider [name], MD    Expected Discharge Date 09/20/22      NuStep   Level 1    SPM 80    Minutes 25    METs 2.9      Prescription Details   Frequency (times per week) 3    Duration Progress to 30 minutes of continuous aerobic without signs/symptoms of physical distress      Intensity   THRR 40-80% of Max Heartrate 56-111    Ratings of Perceived Exertion 11-13    Perceived Dyspnea 0-4      Progression   Progression Continue to progress workloads to maintain intensity without signs/symptoms of physical distress.      Resistance Training   Training Prescription Yes    Weight 3 lbs    Reps 10-15             Perform Capillary Blood Glucose checks as needed.  Exercise Prescription Changes:   Exercise Prescription Changes     Row Name 07/26/22 1400 08/09/22 1400 08/23/22 1500 09/04/22 1600 09/19/22 1500     Response to Exercise   Blood Pressure (Admit) 118/52 118/78 102/60 124/60 120/60   Blood Pressure (Exercise) 120/60 124/68 106/62 128/60 148/72   Blood Pressure (Exit) 110/60 106/70 110/70 104/62 112/70   Heart Rate (Admit) 73 bpm 68 bpm 69 bpm 54 bpm 64 bpm   Heart Rate (Exercise) 89 bpm 97 bpm 96 bpm 103 bpm 104 bpm   Heart Rate (Exit) 77 bpm 75 bpm 76 bpm 67 bpm 73 bpm   Rating of Perceived Exertion (Exercise) 10 11 12 10 11    Symptoms None None None None None   Comments Pt's first day in the CRP2 program Reviewed METs Reviewed METs and goals Reviewed METs and HERX Reviewed METs and goals   Duration Progress to 30 minutes of  aerobic without signs/symptoms of physical  distress Continue with 30 min of aerobic exercise without signs/symptoms of physical distress. Continue with 30 min of aerobic exercise without signs/symptoms of physical distress. Continue with 30 min of aerobic exercise without signs/symptoms of physical distress. Continue with 30 min of aerobic exercise without signs/symptoms of physical distress.   Intensity THRR unchanged THRR unchanged THRR unchanged THRR unchanged THRR unchanged     Progression   Progression Continue to progress workloads to maintain intensity without signs/symptoms of physical distress. Continue to progress workloads to maintain intensity without signs/symptoms of physical distress. Continue to progress workloads to maintain intensity without signs/symptoms of physical distress. Continue to progress workloads to maintain intensity without signs/symptoms of physical distress. Continue to progress workloads to maintain intensity without signs/symptoms of physical distress.   Average METs 2.2 2.5 2.85 3.1 3.4     Resistance Training   Training Prescription Yes Yes Yes No No   Weight 3 lbs 3 lbs 3 lbs No weights on wednesdays No weights on wednesdays   Reps 10-15 10-15 10-15 -- --   Time 10 Minutes 10 Minutes 10 Minutes -- --     Interval Training   Interval Training No No No No No     Recumbant Bike   Level -- -- 2 3 4    RPM -- -- 82 69 53  Watts -- -- 28 73 52   Minutes -- -- 15 15 15    METs -- -- 2.8 3.2 3.8     NuStep   Level 1 2 2 3 3    SPM 103 116 -- 116 118   Minutes 25 30 15 15 15    METs 2.2 2.5 2.9 3 3      Home Exercise Plan   Plans to continue exercise at -- -- -- Home (comment) Home (comment)   Frequency -- -- -- Add 2 additional days to program exercise sessions. Add 2 additional days to program exercise sessions.   Initial Home Exercises Provided -- -- -- 09/04/22 09/04/22            Exercise Comments:   Exercise Comments     Row Name 07/26/22 1416 08/09/22 1424 08/09/22 1427 08/23/22  1500 09/04/22 1616   Exercise Comments Pt's first day in the CRP2 program. Pt exercise with no complaints ansd is off to a good start. Reviewed METs with patient. Pt is making good progress on MET level. Will try patient on recumbent bike next session for 15 minutes. Reviewed METs with patient today, pt is making good progress. Reviewed METs and goals. Pt is making progress and has no voiced complaints with the current exercise Rx. Reviewed METs and home exercise Rx. Pt Christensen to make good progress on his MET levels. Pt will begin to do some walking at home. He will start with 10-15 minutes and bulid up to 30 minutes. Pt agrees to walk 2x/week as to reach the goal of 150 minutes of exercise per week.  Pt verbalized understanding of the home exercise Rx and was provided a copy.    Row Name 09/18/22 1528           Exercise Comments Reviewed METs and goals. Pt progessing well with peak METs and 3.8. Increased workload on recumbent bike today and will increase workload on nustep next session.                Exercise Goals and Review:   Exercise Goals     Row Name 07/09/22 1252             Exercise Goals   Increase Physical Activity Yes       Intervention Provide advice, education, support and counseling about physical activity/exercise needs.;Develop an individualized exercise prescription for aerobic and resistive training based on initial evaluation findings, risk stratification, comorbidities and participant's personal goals.       Expected Outcomes Short Term: Attend rehab on a regular basis to increase amount of physical activity.;Long Term: Add in home exercise to make exercise part of routine and to increase amount of physical activity.;Long Term: Exercising regularly at least 3-5 days a week.       Increase Strength and Stamina Yes       Intervention Provide advice, education, support and counseling about physical activity/exercise needs.;Develop an individualized exercise  prescription for aerobic and resistive training based on initial evaluation findings, risk stratification, comorbidities and participant's personal goals.       Expected Outcomes Short Term: Increase workloads from initial exercise prescription for resistance, speed, and METs.;Short Term: Perform resistance training exercises routinely during rehab and add in resistance training at home;Long Term: Improve cardiorespiratory fitness, muscular endurance and strength as measured by increased METs and functional capacity ( )       Able to understand and use rate of perceived exertion (RPE) scale Yes       Intervention  Provide education and explanation on how to use RPE scale       Expected Outcomes Short Term: Able to use RPE daily in rehab to express subjective intensity level;Long Term:  Able to use RPE to guide intensity level when exercising independently       Knowledge and understanding of Target Heart Rate Range (THRR) Yes       Intervention Provide education and explanation of THRR including how the numbers were predicted and where they are located for reference       Expected Outcomes Short Term: Able to state/look up THRR;Short Term: Able to use daily as guideline for intensity in rehab;Long Term: Able to use THRR to govern intensity when exercising independently       Understanding of Exercise Prescription Yes       Intervention Provide education, explanation, and written materials on patient's individual exercise prescription       Expected Outcomes Short Term: Able to explain program exercise prescription;Long Term: Able to explain home exercise prescription to exercise independently                Exercise Goals Re-Evaluation :  Exercise Goals Re-Evaluation     Row Name 07/26/22 1412 08/23/22 1500 09/18/22 1500         Exercise Goal Re-Evaluation   Exercise Goals Review Increase Physical Activity;Understanding of Exercise Prescription;Increase Strength and Stamina;Knowledge and  understanding of Target Heart Rate Range (THRR);Able to understand and use rate of perceived exertion (RPE) scale Increase Physical Activity;Understanding of Exercise Prescription;Increase Strength and Stamina;Knowledge and understanding of Target Heart Rate Range (THRR);Able to understand and use rate of perceived exertion (RPE) scale Increase Physical Activity;Understanding of Exercise Prescription;Increase Strength and Stamina;Knowledge and understanding of Target Heart Rate Range (THRR);Able to understand and use rate of perceived exertion (RPE) scale     Comments Pt's first day in the CRP2 program. Pt understands the exercise Rx, THRR and RPE scale. Reviewed METs and goals. Pt is making good progress on MET level, peak METs = 2.9.  Pt reports making progress on his goal of increased energy, strength, and stamina. Reviewed METs and goals. Pt is making good progress on MET level, peak METs = 3.8.  Pt Christensen to report progress on his goal of increased energy, strength, and stamina. Pt feels he has done good things for his health and is reducung his risk of CAD.     Expected Outcomes Will continue to montior patient and progress exercise workloads as tolerated. Will continue to montior patient and progress exercise workloads as tolerated. Will continue to montior patient and progress exercise workloads as tolerated.              Discharge Exercise Prescription (Final Exercise Prescription Changes):  Exercise Prescription Changes - 09/19/22 1500       Response to Exercise   Blood Pressure (Admit) 120/60    Blood Pressure (Exercise) 148/72    Blood Pressure (Exit) 112/70    Heart Rate (Admit) 64 bpm    Heart Rate (Exercise) 104 bpm    Heart Rate (Exit) 73 bpm    Rating of Perceived Exertion (Exercise) 11    Symptoms None    Comments Reviewed METs and goals    Duration Continue with 30 min of aerobic exercise without signs/symptoms of physical distress.    Intensity THRR unchanged       Progression   Progression Continue to progress workloads to maintain intensity without signs/symptoms of physical distress.    Average  METs 3.4      Resistance Training   Training Prescription No    Weight No weights on wednesdays      Interval Training   Interval Training No      Recumbant Bike   Level 4    RPM 53    Watts 52    Minutes 15    METs 3.8      NuStep   Level 3    SPM 118    Minutes 15    METs 3      Home Exercise Plan   Plans to continue exercise at Home (comment)    Frequency Add 2 additional days to program exercise sessions.    Initial Home Exercises Provided 09/04/22             Nutrition:  Target Goals: Understanding of nutrition guidelines, daily intake of sodium 1500mg , cholesterol 200mg , calories 30% from fat and 7% or less from saturated fats, daily to have 5 or more servings of fruits and vegetables.  Biometrics:  Pre Biometrics - 07/09/22 1253       Pre Biometrics   Waist Circumference 44.5 inches    Hip Circumference 47 inches    Waist to Hip Ratio 0.95 %    Triceps Skinfold 14 mm    % Body Fat 32 %    Grip Strength 31 kg    Flexibility 0 in   Cannot reach   Single Leg Stand 1 seconds              Nutrition Therapy Plan and Nutrition Goals:  Nutrition Therapy & Goals - 09/30/22 1318       Nutrition Therapy   Diet Heart Healthy Diet    Drug/Food Interactions Statins/Certain Fruits      Personal Nutrition Goals   Nutrition Goal Patient to identify strategies for reducing cardiovascular risk by attending the [facility] education and nutrition series weekly   goal in action.   Personal Goal #2 Patient to improve diet quality by using the plate method as a guide for meal planning to include lean protein/plant protein, fruits, vegetables, whole grains, nonfat dairy as part of a well-balanced diet.   goal in action.   Comments Goals in action. Raymond Christensen reports no nutrition questions/concerns at this time; he has previously  completed cardiac rehab in 12/2019 and 06/2021. He Christensen to attend the [facility] education and nutrition series regularly. He reports eating a wide variety of foods and eating three meals daily. His wife is a good support. He is taking Ozempic for weight loss and is down ~40# per documentation on 08/31/2021; he is down an additional 4.8# since starting with our program. Patient will benefit from participation in intensive cardiac rehab for nutrition, exercise, and lifestyle modification.      Intervention Plan   Intervention Prescribe, educate and counsel regarding individualized specific dietary modifications aiming towards targeted core components such as weight, hypertension, lipid management, diabetes, heart failure and other comorbidities.;Nutrition handout(s) given to patient.    Expected Outcomes Short Term Goal: Understand basic principles of dietary content, such as calories, fat, sodium, cholesterol and nutrients.;Long Term Goal: Adherence to prescribed nutrition plan.             Nutrition Assessments:  MEDIFICTS Score Key: >=70 Need to make dietary changes  40-70 Heart Healthy Diet <= 40 Therapeutic Level Cholesterol Diet   Flowsheet Row CARDIAC REHAB PHASE II EXERCISE from 06/23/2020 in [facility]  Picture Your Plate Total Score on Discharge 64      Picture Your Plate Scores: <16 Unhealthy dietary pattern with much room for improvement. 41-50 Dietary pattern unlikely to meet recommendations for good health and room for improvement. 51-60 More healthful dietary pattern, with some room for improvement.  >60 Healthy dietary pattern, although there may be some specific behaviors that could be improved.    Nutrition Goals Re-Evaluation:  Nutrition Goals Re-Evaluation     Row Name 07/26/22 1402 08/26/22 1312 09/30/22 1318         Goals   Current Weight 209 lb 7 oz (95 kg) 207 lb 7.3 oz (94.1 kg) 207 lb 3.7 oz  (94 kg)     Comment lipoproteinA WNL, A1c is well-controlled at 5.4, HDL 40, potassium 3.3 no new labs; most recent labs  lipoproteinA WNL, A1c is well-controlled at 5.4, HDL 40, potassium 3.3 no new labs; most recent labs lipoproteinA WNL, A1c is well-controlled at 5.4, HDL 40, potassium 3.3, LDL <55 (34)     Expected Outcome Goals in action. Constant reports no nutrition questions/concerns at this time; he has previously completed cardiac rehab in 12/2019 and 06/2021. He reports eating a wide variety of foods and eating three meals daily. His wife is a good support. He is taking Ozempic for weight loss and is down ~40# per documentation on 08/31/2021. Patient will benefit from participation in intensive cardiac rehab for nutrition, exercise, and lifestyle modification. Goals in action. Raymond Christensen reports no nutrition questions/concerns at this time; he has previously completed cardiac rehab in 12/2019 and 06/2021. He Christensen to attend the [facility] education and nutrition series regularly. He reports eating a wide variety of foods and eating three meals daily. His wife is a good support. He is taking Ozempic for weight loss and is down ~40# per documentation on 08/31/2021; he is down an additional 4.6# since starting with our program. Patient will benefit from participation in intensive cardiac rehab for nutrition, exercise, and lifestyle modification. Goals in action. Raymond Christensen reports no nutrition questions/concerns at this time; he has previously completed cardiac rehab in 12/2019 and 06/2021. He Christensen to attend the [facility] education and nutrition series regularly. He reports eating a wide variety of foods and eating three meals daily. His wife is a good support. He is taking Ozempic for weight loss and is down ~40# per documentation on 08/31/2021; he is down an additional 4.8# since starting with our program. Patient will benefit from participation in intensive cardiac rehab for nutrition, exercise, and lifestyle  modification.              Nutrition Goals Re-Evaluation:  Nutrition Goals Re-Evaluation     Row Name 07/26/22 1402 08/26/22 1312 09/30/22 1318         Goals   Current Weight 209 lb 7 oz (95 kg) 207 lb 7.3 oz (94.1 kg) 207 lb 3.7 oz (94 kg)     Comment lipoproteinA WNL, A1c is well-controlled at 5.4, HDL 40, potassium 3.3 no new labs; most recent labs  lipoproteinA WNL, A1c is well-controlled at 5.4, HDL 40, potassium 3.3 no new labs; most recent labs lipoproteinA WNL, A1c is well-controlled at 5.4, HDL 40, potassium 3.3, LDL <55 (34)     Expected Outcome Goals in action. Raymond Christensen reports no nutrition questions/concerns at this time; he has previously completed cardiac rehab in 12/2019 and 06/2021. He reports eating a wide variety of foods and eating three meals daily. His wife is a good support.  He is taking Ozempic for weight loss and is down ~40# per documentation on 08/31/2021. Patient will benefit from participation in intensive cardiac rehab for nutrition, exercise, and lifestyle modification. Goals in action. Raymond Christensen reports no nutrition questions/concerns at this time; he has previously completed cardiac rehab in 12/2019 and 06/2021. He Christensen to attend the [facility] education and nutrition series regularly. He reports eating a wide variety of foods and eating three meals daily. His wife is a good support. He is taking Ozempic for weight loss and is down ~40# per documentation on 08/31/2021; he is down an additional 4.6# since starting with our program. Patient will benefit from participation in intensive cardiac rehab for nutrition, exercise, and lifestyle modification. Goals in action. Raymond Christensen reports no nutrition questions/concerns at this time; he has previously completed cardiac rehab in 12/2019 and 06/2021. He Christensen to attend the [facility] education and nutrition series regularly. He reports eating a wide variety of foods and eating three meals daily. His wife is a good support. He is  taking Ozempic for weight loss and is down ~40# per documentation on 08/31/2021; he is down an additional 4.8# since starting with our program. Patient will benefit from participation in intensive cardiac rehab for nutrition, exercise, and lifestyle modification.              Nutrition Goals Discharge (Final Nutrition Goals Re-Evaluation):  Nutrition Goals Re-Evaluation - 09/30/22 1318       Goals   Current Weight 207 lb 3.7 oz (94 kg)    Comment no new labs; most recent labs lipoproteinA WNL, A1c is well-controlled at 5.4, HDL 40, potassium 3.3, LDL <55 (34)    Expected Outcome Goals in action. Raymond Christensen reports no nutrition questions/concerns at this time; he has previously completed cardiac rehab in 12/2019 and 06/2021. He Christensen to attend the [facility] education and nutrition series regularly. He reports eating a wide variety of foods and eating three meals daily. His wife is a good support. He is taking Ozempic for weight loss and is down ~40# per documentation on 08/31/2021; he is down an additional 4.8# since starting with our program. Patient will benefit from participation in intensive cardiac rehab for nutrition, exercise, and lifestyle modification.             Psychosocial: Target Goals: Acknowledge presence or absence of significant depression and/or stress, maximize coping skills, provide positive support system. Participant is able to verbalize types and ability to use techniques and skills needed for reducing stress and depression.  Initial Review & Psychosocial Screening:  Initial Psych Review & Screening - 07/09/22 1008       Initial Review   Current issues with None Identified;History of Depression      Family Dynamics   Good Support System? Yes   Has spouse and younger son for support   Comments Raymond Christensen denies being depressed. Raymond Christensen takes Zoloft. Pt feels that his medication is working well for him.      Barriers   Psychosocial barriers to participate in  program There are no identifiable barriers or psychosocial needs.      Screening Interventions   Interventions Encouraged to exercise    Expected Outcomes Long Term Goal: Stressors or current issues are controlled or eliminated.             Quality of Life Scores:  Quality of Life - 07/09/22 1243       Quality of Life   Select Quality of Life      Quality  of Life Scores   Health/Function Pre 19.23 %    Socioeconomic Pre 27.5 %    Psych/Spiritual Pre 22.86 %    Family Pre 27.6 %    GLOBAL Pre 22.91 %            Scores of 19 and below usually indicate a poorer quality of life in these areas.  A difference of  2-3 points is a clinically meaningful difference.  A difference of 2-3 points in the total score of the Quality of Life Index has been associated with significant improvement in overall quality of life, self-image, physical symptoms, and general health in studies assessing change in quality of life.  PHQ-9: Review Flowsheet  More data exists      10/07/2022 07/31/2022 07/09/2022 05/29/2022 02/11/2022  Depression screen PHQ 2/9  Decreased Interest 0 0 0 0 0  Down, Depressed, Hopeless 0 0 0 0 2  PHQ - 2 Score 0 0 0 0 2  Altered sleeping 0 - 0 0 2  Tired, decreased energy 0 - 2 0 3  Change in appetite 0 - 1 0 1  Feeling bad or failure about yourself  0 - 1 0 0  Trouble concentrating 0 - 0 0 0  Moving slowly or fidgety/restless 0 - 0 0 0  Suicidal thoughts 0 - 0 0 0  PHQ-9 Score 0 - 4 0 8  Difficult doing work/chores Not difficult at all - Not difficult at all Not difficult at all Not difficult at all    Details           Interpretation of Total Score  Total Score Depression Severity:  1-4 = Minimal depression, 5-9 = Mild depression, 10-14 = Moderate depression, 15-19 = Moderately severe depression, 20-27 = Severe depression   Psychosocial Evaluation and Intervention:   Psychosocial Re-Evaluation:  Psychosocial Re-Evaluation     Row Name 08/13/22 1546  09/10/22 1551 10/08/22 1529         Psychosocial Re-Evaluation   Current issues with None Identified None Identified None Identified;History of Depression     Comments -- -- Seward Christensen not to voice any increased concerns or stressors during exercise at cardiac rehab     Interventions Encouraged to attend Cardiac Rehabilitation for the exercise Encouraged to attend Cardiac Rehabilitation for the exercise Encouraged to attend Cardiac Rehabilitation for the exercise     Continue Psychosocial Services  No Follow up required No Follow up required No Follow up required              Psychosocial Discharge (Final Psychosocial Re-Evaluation):  Psychosocial Re-Evaluation - 10/08/22 1529       Psychosocial Re-Evaluation   Current issues with None Identified;History of Depression    Comments Raymond Christensen not to voice any increased concerns or stressors during exercise at cardiac rehab    Interventions Encouraged to attend Cardiac Rehabilitation for the exercise    Continue Psychosocial Services  No Follow up required             Vocational Rehabilitation: Provide vocational rehab assistance to qualifying candidates.   Vocational Rehab Evaluation & Intervention:  Vocational Rehab - 07/09/22 1010       Initial Vocational Rehab Evaluation & Intervention   Assessment shows need for Vocational Rehabilitation No   Pt is retired.            Education: Education Goals: Education classes will be provided on a weekly basis, covering required topics. Participant will state understanding/return demonstration of  topics presented.    Education     Row Name 07/26/22 1200     Education   Cardiac Education Topics Pritikin   Psychologist, forensic General Education   General Education Hypertension and Heart Disease   Instruction Review Code 1- Verbalizes Understanding   Class Start Time 1141   Class Stop Time 1217    Class Time Calculation (min) 36 min    Row Name 07/29/22 1400     Education   Cardiac Education Topics Pritikin   Psychologist, forensic Exercise Education   Exercise Education Biomechanial Limitations   Instruction Review Code 1- Verbalizes Understanding   Class Start Time 1150   Class Stop Time 1230   Class Time Calculation (min) 40 min    Row Name 07/31/22 1300     Education   Cardiac Education Topics Pritikin   Customer service manager   Weekly Topic Comforting Weekend Breakfasts   Instruction Review Code 1- Verbalizes Understanding   Class Start Time 1143   Class Stop Time 1216   Class Time Calculation (min) 33 min    Row Name 08/05/22 1600     Education   Cardiac Education Topics Pritikin   Nurse, children's   Educator Dietitian   Select Nutrition   Nutrition Facts on Fat   Instruction Review Code 1- Verbalizes Understanding   Class Start Time 1150   Class Stop Time 1222   Class Time Calculation (min) 32 min    Row Name 08/07/22 1300     Education   Cardiac Education Topics Pritikin   Orthoptist   Educator Dietitian   Weekly Topic Fast Evening Meals   Instruction Review Code 1- Verbalizes Understanding   Class Start Time 1138   Class Stop Time 1216   Class Time Calculation (min) 38 min    Row Name 08/09/22 1300     Education   Cardiac Education Topics Pritikin   Licensed conveyancer Nutrition   Nutrition Vitamins and Minerals   Instruction Review Code 1- Verbalizes Understanding   Class Start Time 1145   Class Stop Time 1230   Class Time Calculation (min) 45 min    Row Name 08/12/22 1300     Education   Cardiac Education Topics Pritikin   Geographical information systems officer Psychosocial   Psychosocial Workshop  Healthy Sleep for a Healthy Heart   Instruction Review Code 1- Verbalizes Understanding   Class Start Time 1148   Class Stop Time 1235   Class Time Calculation (min) 47 min    Row Name 08/14/22 1600     Education   Cardiac Education Topics Pritikin   Customer service manager   Weekly Topic International Cuisine- Spotlight on the [employer] Zones   Instruction Review Code 1- Verbalizes Understanding   Class Start Time 1145   Class Stop Time 1226   Class Time Calculation (min) 41 min    Row Name 08/16/22 1200     Education   Cardiac Education Topics Pritikin  Select Core Videos     Core Videos   Educator Exercise Clinical cytogeneticist Exercise Education   Exercise Education Improving Performance   Instruction Review Code 1- Verbalizes Understanding   Class Start Time 1150    Row Name 08/19/22 1300     Education   Cardiac Education Topics Pritikin   Glass blower/designer Nutrition   Nutrition Workshop Fueling a Forensic psychologist   Instruction Review Code 1- TEFL teacher Understanding   Class Start Time 1145   Class Stop Time 1234   Class Time Calculation (min) 49 min    Row Name 08/21/22 1400     Education   Cardiac Education Topics Pritikin   Customer service manager   Weekly Topic Simple Sides and Sauces   Instruction Review Code 1- Verbalizes Understanding   Class Start Time 1145   Class Stop Time 1226   Class Time Calculation (min) 41 min    Row Name 08/21/22 1500     Education   Cardiac Education Topics Pritikin   Customer service manager   Weekly Topic Simple Sides and Sauces   Instruction Review Code 1- Verbalizes Understanding   Class Start Time 1400   Class Stop Time 1445   Class Time Calculation (min) 45 min    Row Name 08/23/22 1200     Education   Cardiac Education Topics Pritikin   Passenger transport manager Exercise Physiologist   Select Psychosocial   Psychosocial How Our Thoughts Can Heal Our Hearts   Instruction Review Code 1- Verbalizes Understanding   Class Start Time 1150   Class Stop Time 1230   Class Time Calculation (min) 40 min    Row Name 08/26/22 1300     Education   Cardiac Education Topics Pritikin   Select Workshops     Workshops   Educator Exercise Physiologist   Select Exercise   Exercise Workshop Managing Heart Disease: Your Path to a Healthier Heart   Instruction Review Code 1- Verbalizes Understanding   Class Start Time 1147   Class Stop Time 1256   Class Time Calculation (min) 69 min    Row Name 08/28/22 1500     Education   Cardiac Education Topics Pritikin   Orthoptist   Educator Dietitian   Weekly Topic Powerhouse Plant-Based Proteins   Instruction Review Code 1- Verbalizes Understanding   Class Start Time 1145   Class Stop Time 1228   Class Time Calculation (min) 43 min    Row Name 08/30/22 1200     Education   Cardiac Education Topics Pritikin   Hospital doctor Education   General Education Hypertension and Heart Disease   Instruction Review Code 1- Verbalizes Understanding   Class Start Time 1200   Class Stop Time 1239   Class Time Calculation (min) 39 min    Row Name 09/02/22 1200     Education   Cardiac Education Topics Pritikin   Geographical information systems officer Psychosocial   Psychosocial Workshop From Head to Heart: The Power of a Healthy Outlook   Instruction Review Code  1- Verbalizes Understanding   Class Start Time 1155   Class Stop Time 1245   Class Time Calculation (min) 50 min    Row Name 09/04/22 1300     Education   Cardiac Education Topics Pritikin   Customer service manager   Weekly Topic  Adding Flavor - Sodium-Free   Instruction Review Code 1- Verbalizes Understanding   Class Start Time 1140   Class Stop Time 1215   Class Time Calculation (min) 35 min    Row Name 09/06/22 1300     Education   Cardiac Education Topics Pritikin   Hospital doctor Education   General Education Heart Disease Risk Reduction   Instruction Review Code 1- Verbalizes Understanding   Class Start Time 1146   Class Stop Time 1223   Class Time Calculation (min) 37 min    Row Name 09/11/22 1400     Education   Cardiac Education Topics Pritikin   Customer service manager   Weekly Topic Fast and Healthy Breakfasts   Instruction Review Code 1- Verbalizes Understanding   Class Start Time 1140   Class Stop Time 1225   Class Time Calculation (min) 45 min    Row Name 09/11/22 1500     Education   Cardiac Education Topics Pritikin   Customer service manager   Weekly Topic Fast and Healthy Breakfasts   Instruction Review Code 1- Verbalizes Understanding   Class Start Time 1400   Class Stop Time 1445   Class Time Calculation (min) 45 min    Row Name 09/13/22 1400     Education   Cardiac Education Topics Pritikin   Writer Psychosocial   Psychosocial Healthy Minds, Bodies, Hearts   Instruction Review Code 1- Verbalizes Understanding   Class Start Time 1145   Class Stop Time 1222   Class Time Calculation (min) 37 min    Row Name 09/18/22 1400     Education   Cardiac Education Topics Pritikin   Customer service manager   Weekly Topic Personalizing Your Pritikin Plate   Instruction Review Code 1- Verbalizes Understanding   Class Start Time 1150   Class Stop Time 1225   Class Time Calculation (min) 35 min    Row Name 09/20/22 1300      Education   Cardiac Education Topics Pritikin   Select Workshops     Workshops   Educator Exercise Physiologist   Select Exercise   Exercise Workshop Location manager and Fall Prevention   Instruction Review Code 1- Verbalizes Understanding   Class Start Time 1151   Class Stop Time 1238   Class Time Calculation (min) 47 min    Row Name 09/25/22 1400     Education   Cardiac Education Topics Pritikin   Customer service manager   Weekly Topic Delicious Desserts   Instruction Review Code 1- Verbalizes Understanding   Class Start Time 1145   Class Stop Time 1230   Class Time Calculation (min) 45 min    Row Name 09/27/22 1500     Education  Cardiac Education Topics Pritikin   Select Core Videos     Core Videos   Educator Dietitian   Select Nutrition   Nutrition Other  label reading   Instruction Review Code 1- Verbalizes Understanding   Class Start Time 1400   Class Stop Time 1500   Class Time Calculation (min) 60 min    Row Name 09/30/22 1500     Education   Cardiac Education Topics Pritikin   Select Workshops     Workshops   Educator Exercise Physiologist   Select Psychosocial   Psychosocial Workshop Recognizing and Reducing Stress   Instruction Review Code 1- Verbalizes Understanding   Class Start Time 1148   Class Stop Time 1237   Class Time Calculation (min) 49 min    Row Name 10/02/22 1400     Education   Cardiac Education Topics Pritikin   Customer service manager   Weekly Topic Tasty Appetizers and Snacks   Instruction Review Code 1- Verbalizes Understanding   Class Start Time 1145   Class Stop Time 1225   Class Time Calculation (min) 40 min    Row Name 10/04/22 1400     Education   Cardiac Education Topics Pritikin   Nurse, children's Exercise Physiologist   Select Nutrition   Nutrition Calorie Density   Instruction Review Code 1-  Verbalizes Understanding   Class Start Time 1155   Class Stop Time 1235   Class Time Calculation (min) 40 min    Row Name 10/07/22 1300     Education   Cardiac Education Topics Pritikin   Select Workshops     Workshops   Educator Exercise Physiologist   Select Exercise   Exercise Workshop Exercise Basics: Building Your Action Plan   Instruction Review Code 1- Verbalizes Understanding   Class Start Time 1155   Class Stop Time 1237   Class Time Calculation (min) 42 min            Core Videos: Exercise    Move It!  Clinical staff conducted group or individual video education with verbal and written material and guidebook.  Patient learns the recommended Pritikin exercise program. Exercise with the goal of living a long, healthy life. Some of the health benefits of exercise include controlled diabetes, healthier blood pressure levels, improved cholesterol levels, improved heart and lung capacity, improved sleep, and better body composition. Everyone should speak with their doctor before starting or changing an exercise routine.  Biomechanical Limitations Clinical staff conducted group or individual video education with verbal and written material and guidebook.  Patient learns how biomechanical limitations can impact exercise and how we can mitigate and possibly overcome limitations to have an impactful and balanced exercise routine.  Body Composition Clinical staff conducted group or individual video education with verbal and written material and guidebook.  Patient learns that body composition (ratio of muscle mass to fat mass) is a key component to assessing overall fitness, rather than body weight alone. Increased fat mass, especially visceral belly fat, can put [place] at increased risk for metabolic syndrome, type 2 diabetes, heart disease, and even death. It is recommended to combine diet and exercise (cardiovascular and resistance training) to improve your body composition. Seek  guidance from your physician and exercise physiologist before implementing an exercise routine.  Exercise Action Plan Clinical staff conducted group or individual video education with verbal and written material and  guidebook.  Patient learns the recommended strategies to achieve and enjoy long-term exercise adherence, including variety, self-motivation, self-efficacy, and positive decision making. Benefits of exercise include fitness, good health, weight management, more energy, better sleep, less stress, and overall well-being.  Medical   Heart Disease Risk Reduction Clinical staff conducted group or individual video education with verbal and written material and guidebook.  Patient learns our heart is our most vital organ as it circulates oxygen, nutrients, white blood cells, and hormones throughout the entire body, and carries waste away. Data supports a plant-based eating plan like the Pritikin Program for its effectiveness in slowing progression of and reversing heart disease. The video provides a number of recommendations to address heart disease.   Metabolic Syndrome and Belly Fat  Clinical staff conducted group or individual video education with verbal and written material and guidebook.  Patient learns what metabolic syndrome is, how it leads to heart disease, and how one can reverse it and keep it from coming back. You have metabolic syndrome if you have 3 of the following 5 criteria: abdominal obesity, high blood pressure, high triglycerides, low HDL cholesterol, and high blood sugar.  Hypertension and Heart Disease Clinical staff conducted group or individual video education with verbal and written material and guidebook.  Patient learns that high blood pressure, or hypertension, is very common in the [place]. Hypertension is largely due to excessive salt intake, but other important risk factors include being overweight, physical inactivity, drinking too much alcohol, smoking, and  not eating enough potassium from fruits and vegetables. High blood pressure is a leading risk factor for heart attack, stroke, congestive heart failure, dementia, kidney failure, and premature death. Long-term effects of excessive salt intake include stiffening of the arteries and thickening of heart muscle and organ damage. Recommendations include ways to reduce hypertension and the risk of heart disease.  Diseases of Our Time - Focusing on Diabetes Clinical staff conducted group or individual video education with verbal and written material and guidebook.  Patient learns why the best way to stop diseases of our time is prevention, through food and other lifestyle changes. Medicine (such as prescription pills and surgeries) is often only a Band-Aid on the problem, not a long-term solution. Most common diseases of our time include obesity, type 2 diabetes, hypertension, heart disease, and cancer. The Pritikin Program is recommended and has been proven to help reduce, reverse, and/or prevent the damaging effects of metabolic syndrome.  Nutrition   Overview of the Pritikin Eating Plan  Clinical staff conducted group or individual video education with verbal and written material and guidebook.  Patient learns about the Pritikin Eating Plan for disease risk reduction. The Pritikin Eating Plan emphasizes a wide variety of unrefined, minimally-processed carbohydrates, like fruits, vegetables, whole grains, and legumes. Go, Caution, and Stop food choices are explained. Plant-based and lean animal proteins are emphasized. Rationale provided for low sodium intake for blood pressure control, low added sugars for blood sugar stabilization, and low added fats and oils for coronary artery disease risk reduction and weight management.  Calorie Density  Clinical staff conducted group or individual video education with verbal and written material and guidebook.  Patient learns about calorie density and how it impacts  the Pritikin Eating Plan. Knowing the characteristics of the food you choose will help you decide whether those foods will lead to weight gain or weight loss, and whether you want to consume more or less of them. Weight loss is usually a side effect of  the Pritikin Eating Plan because of its focus on low calorie-dense foods.  Label Reading  Clinical staff conducted group or individual video education with verbal and written material and guidebook.  Patient learns about the Pritikin recommended label reading guidelines and corresponding recommendations regarding calorie density, added sugars, sodium content, and whole grains.  Dining Out - Part 1  Clinical staff conducted group or individual video education with verbal and written material and guidebook.  Patient learns that restaurant meals can be sabotaging because they can be so high in calories, fat, sodium, and/or sugar. Patient learns recommended strategies on how to positively address this and avoid unhealthy pitfalls.  Facts on Fats  Clinical staff conducted group or individual video education with verbal and written material and guidebook.  Patient learns that lifestyle modifications can be just as effective, if not more so, as many medications for lowering your risk of heart disease. A Pritikin lifestyle can help to reduce your risk of inflammation and atherosclerosis (cholesterol build-up, or plaque, in the artery walls). Lifestyle interventions such as dietary choices and physical activity address the cause of atherosclerosis. A review of the types of fats and their impact on blood cholesterol levels, along with dietary recommendations to reduce fat intake is also included.  Nutrition Action Plan  Clinical staff conducted group or individual video education with verbal and written material and guidebook.  Patient learns how to incorporate Pritikin recommendations into their lifestyle. Recommendations include planning and keeping personal  health goals in mind as an important part of their success.  Healthy Mind-Set    Healthy Minds, Bodies, Hearts  Clinical staff conducted group or individual video education with verbal and written material and guidebook.  Patient learns how to identify when they are stressed. Video will discuss the impact of that stress, as well as the many benefits of stress management. Patient will also be introduced to stress management techniques. The way we think, act, and feel has an impact on our hearts.  How Our Thoughts Can Heal Our Hearts  Clinical staff conducted group or individual video education with verbal and written material and guidebook.  Patient learns that negative thoughts can cause depression and anxiety. This can result in negative lifestyle behavior and serious health problems. Cognitive behavioral therapy is an effective method to help control our thoughts in order to change and improve our emotional outlook.  Additional Videos:  Exercise    Improving Performance  Clinical staff conducted group or individual video education with verbal and written material and guidebook.  Patient learns to use a non-linear approach by alternating intensity levels and lengths of time spent exercising to help burn more calories and lose more body fat. Cardiovascular exercise helps improve heart health, metabolism, hormonal balance, blood sugar control, and recovery from fatigue. Resistance training improves strength, endurance, balance, coordination, reaction time, metabolism, and muscle mass. Flexibility exercise improves circulation, posture, and balance. Seek guidance from your physician and exercise physiologist before implementing an exercise routine and learn your capabilities and proper form for all exercise.  Introduction to Yoga  Clinical staff conducted group or individual video education with verbal and written material and guidebook.  Patient learns about yoga, a discipline of the coming  together of mind, breath, and body. The benefits of yoga include improved flexibility, improved range of motion, better posture and core strength, increased lung function, weight loss, and positive self-image. Yoga's heart health benefits include lowered blood pressure, healthier heart rate, decreased cholesterol and triglyceride levels, improved immune function,  and reduced stress. Seek guidance from your physician and exercise physiologist before implementing an exercise routine and learn your capabilities and proper form for all exercise.  Medical   Aging: Enhancing Your Quality of Life  Clinical staff conducted group or individual video education with verbal and written material and guidebook.  Patient learns key strategies and recommendations to stay in good physical health and enhance quality of life, such as prevention strategies, having an advocate, securing a Health Care Proxy and Power of Attorney, and keeping a list of medications and system for tracking them. It also discusses how to avoid risk for bone loss.  Biology of Weight Control  Clinical staff conducted group or individual video education with verbal and written material and guidebook.  Patient learns that weight gain occurs because we consume more calories than we burn (eating more, moving less). Even if your body weight is normal, you may have higher ratios of fat compared to muscle mass. Too much body fat puts you at increased risk for cardiovascular disease, heart attack, stroke, type 2 diabetes, and obesity-related cancers. In addition to exercise, following the Pritikin Eating Plan can help reduce your risk.  Decoding Lab Results  Clinical staff conducted group or individual video education with verbal and written material and guidebook.  Patient learns that lab test reflects one measurement whose values change over time and are influenced by many factors, including medication, stress, sleep, exercise, food, hydration,  pre-existing medical conditions, and more. It is recommended to use the knowledge from this video to become more involved with your lab results and evaluate your numbers to speak with your doctor.   Diseases of Our Time - Overview  Clinical staff conducted group or individual video education with verbal and written material and guidebook.  Patient learns that according to the CDC, 50% to 70% of chronic diseases (such as obesity, type 2 diabetes, elevated lipids, hypertension, and heart disease) are avoidable through lifestyle improvements including healthier food choices, listening to satiety cues, and increased physical activity.  Sleep Disorders Clinical staff conducted group or individual video education with verbal and written material and guidebook.  Patient learns how good quality and duration of sleep are important to overall health and well-being. Patient also learns about sleep disorders and how they impact health along with recommendations to address them, including discussing with a physician.  Nutrition  Dining Out - Part 2 Clinical staff conducted group or individual video education with verbal and written material and guidebook.  Patient learns how to plan ahead and communicate in order to maximize their dining experience in a healthy and nutritious manner. Included are recommended food choices based on the type of restaurant the patient is visiting.   Fueling a Banker conducted group or individual video education with verbal and written material and guidebook.  There is a strong connection between our food choices and our health. Diseases like obesity and type 2 diabetes are very prevalent and are in large-part due to lifestyle choices. The Pritikin Eating Plan provides plenty of food and hunger-curbing satisfaction. It is easy to follow, affordable, and helps reduce health risks.  Menu Workshop  Clinical staff conducted group or individual video education  with verbal and written material and guidebook.  Patient learns that restaurant meals can sabotage health goals because they are often packed with calories, fat, sodium, and sugar. Recommendations include strategies to plan ahead and to communicate with the manager, chef, or server to help order a healthier  meal.  Planning Your Eating Strategy  Clinical staff conducted group or individual video education with verbal and written material and guidebook.  Patient learns about the Pritikin Eating Plan and its benefit of reducing the risk of disease. The Pritikin Eating Plan does not focus on calories. Instead, it emphasizes high-quality, nutrient-rich foods. By knowing the characteristics of the foods, we choose, we can determine their calorie density and make informed decisions.  Targeting Your Nutrition Priorities  Clinical staff conducted group or individual video education with verbal and written material and guidebook.  Patient learns that lifestyle habits have a tremendous impact on disease risk and progression. This video provides eating and physical activity recommendations based on your personal health goals, such as reducing LDL cholesterol, losing weight, preventing or controlling type 2 diabetes, and reducing high blood pressure.  Vitamins and Minerals  Clinical staff conducted group or individual video education with verbal and written material and guidebook.  Patient learns different ways to obtain key vitamins and minerals, including through a recommended healthy diet. It is important to discuss all supplements you take with your doctor.   Healthy Mind-Set    Smoking Cessation  Clinical staff conducted group or individual video education with verbal and written material and guidebook.  Patient learns that cigarette smoking and tobacco addiction pose a serious health risk which affects millions of people. Stopping smoking will significantly reduce the risk of heart disease, lung disease,  and many forms of cancer. Recommended strategies for quitting are covered, including working with your doctor to develop a successful plan.  Culinary   Becoming a Set designer conducted group or individual video education with verbal and written material and guidebook.  Patient learns that cooking at home can be healthy, cost-effective, quick, and puts them in control. Keys to cooking healthy recipes will include looking at your recipe, assessing your equipment needs, planning ahead, making it simple, choosing cost-effective seasonal ingredients, and limiting the use of added fats, salts, and sugars.  Cooking - Breakfast and Snacks  Clinical staff conducted group or individual video education with verbal and written material and guidebook.  Patient learns how important breakfast is to satiety and nutrition through the entire day. Recommendations include key foods to eat during breakfast to help stabilize blood sugar levels and to prevent overeating at meals later in the day. Planning ahead is also a key component.  Cooking - Educational psychologist conducted group or individual video education with verbal and written material and guidebook.  Patient learns eating strategies to improve overall health, including an approach to cook more at home. Recommendations include thinking of animal protein as a side on your plate rather than center stage and focusing instead on lower calorie dense options like vegetables, fruits, whole grains, and plant-based proteins, such as beans. Making sauces in large quantities to freeze for later and leaving the skin on your vegetables are also recommended to maximize your experience.  Cooking - Healthy Salads and Dressing Clinical staff conducted group or individual video education with verbal and written material and guidebook.  Patient learns that vegetables, fruits, whole grains, and legumes are the foundations of the Pritikin Eating Plan.  Recommendations include how to incorporate each of these in flavorful and healthy salads, and how to create homemade salad dressings. Proper handling of ingredients is also covered. Cooking - Soups and [employer] - Soups and Desserts Clinical staff conducted group or individual video education with verbal and written material  and guidebook.  Patient learns that Pritikin soups and desserts make for easy, nutritious, and delicious snacks and meal components that are low in sodium, fat, sugar, and calorie density, while high in vitamins, minerals, and filling fiber. Recommendations include simple and healthy ideas for soups and desserts.   Overview     The Pritikin Solution Program Overview Clinical staff conducted group or individual video education with verbal and written material and guidebook.  Patient learns that the results of the Pritikin Program have been documented in more than 100 articles published in peer-reviewed journals, and the benefits include reducing risk factors for (and, in some cases, even reversing) high cholesterol, high blood pressure, type 2 diabetes, obesity, and more! An overview of the three key pillars of the Pritikin Program will be covered: eating well, doing regular exercise, and having a healthy mind-set.  WORKSHOPS  Exercise: Exercise Basics: Building Your Action Plan Clinical staff led group instruction and group discussion with PowerPoint presentation and patient guidebook. To enhance the learning environment the use of posters, models and videos may be added. At the conclusion of this workshop, patients will comprehend the difference between physical activity and exercise, as well as the benefits of incorporating both, into their routine. Patients will understand the FITT (Frequency, Intensity, Time, and Type) principle and how to use it to build an exercise action plan. In addition, safety concerns and other considerations for exercise and cardiac rehab  will be addressed by the presenter. The purpose of this lesson is to promote a comprehensive and effective weekly exercise routine in order to improve patients' overall level of fitness.   Managing Heart Disease: Your Path to a Healthier Heart Clinical staff led group instruction and group discussion with PowerPoint presentation and patient guidebook. To enhance the learning environment the use of posters, models and videos may be added.At the conclusion of this workshop, patients will understand the anatomy and physiology of the heart. Additionally, they will understand how Pritikin's three pillars impact the risk factors, the progression, and the management of heart disease.  The purpose of this lesson is to provide a high-level overview of the heart, heart disease, and how the Pritikin lifestyle positively impacts risk factors.  Exercise Biomechanics Clinical staff led group instruction and group discussion with PowerPoint presentation and patient guidebook. To enhance the learning environment the use of posters, models and videos may be added. Patients will learn how the structural parts of their bodies function and how these functions impact their daily activities, movement, and exercise. Patients will learn how to promote a neutral spine, learn how to manage pain, and identify ways to improve their physical movement in order to promote healthy living. The purpose of this lesson is to expose patients to common physical limitations that impact physical activity. Participants will learn practical ways to adapt and manage aches and pains, and to minimize their effect on regular exercise. Patients will learn how to maintain good posture while sitting, walking, and lifting.  Balance Training and Fall Prevention  Clinical staff led group instruction and group discussion with PowerPoint presentation and patient guidebook. To enhance the learning environment the use of posters, models and videos  may be added. At the conclusion of this workshop, patients will understand the importance of their sensorimotor skills (vision, proprioception, and the vestibular system) in maintaining their ability to balance as they age. Patients will apply a variety of balancing exercises that are appropriate for their current level of function. Patients will understand the common causes  for poor balance, possible solutions to these problems, and ways to modify their physical environment in order to minimize their fall risk. The purpose of this lesson is to teach patients about the importance of maintaining balance as they age and ways to minimize their risk of falling.  WORKSHOPS   Nutrition:  Fueling a Ship broker led group instruction and group discussion with PowerPoint presentation and patient guidebook. To enhance the learning environment the use of posters, models and videos may be added. Patients will review the foundational principles of the Pritikin Eating Plan and understand what constitutes a serving size in each of the food groups. Patients will also learn Pritikin-friendly foods that are better choices when away from home and review make-ahead meal and snack options. Calorie density will be reviewed and applied to three nutrition priorities: weight maintenance, weight loss, and weight gain. The purpose of this lesson is to reinforce (in a group setting) the key concepts around what patients are recommended to eat and how to apply these guidelines when away from home by planning and selecting Pritikin-friendly options. Patients will understand how calorie density may be adjusted for different weight management goals.  Mindful Eating  Clinical staff led group instruction and group discussion with PowerPoint presentation and patient guidebook. To enhance the learning environment the use of posters, models and videos may be added. Patients will briefly review the concepts of the Pritikin  Eating Plan and the importance of low-calorie dense foods. The concept of mindful eating will be introduced as well as the importance of paying attention to internal hunger signals. Triggers for non-hunger eating and techniques for dealing with triggers will be explored. The purpose of this lesson is to provide patients with the opportunity to review the basic principles of the Pritikin Eating Plan, discuss the value of eating mindfully and how to measure internal cues of hunger and fullness using the Hunger Scale. Patients will also discuss reasons for non-hunger eating and learn strategies to use for controlling emotional eating.  Targeting Your Nutrition Priorities Clinical staff led group instruction and group discussion with PowerPoint presentation and patient guidebook. To enhance the learning environment the use of posters, models and videos may be added. Patients will learn how to determine their genetic susceptibility to disease by reviewing their family history. Patients will gain insight into the importance of diet as part of an overall healthy lifestyle in mitigating the impact of genetics and other environmental insults. The purpose of this lesson is to provide patients with the opportunity to assess their personal nutrition priorities by looking at their family history, their own health history and current risk factors. Patients will also be able to discuss ways of prioritizing and modifying the Pritikin Eating Plan for their highest risk areas  Menu  Clinical staff led group instruction and group discussion with PowerPoint presentation and patient guidebook. To enhance the learning environment the use of posters, models and videos may be added. Using menus brought in from [employer], or printed from [employer], patients will apply the Pritikin dining out guidelines that were presented in the [employer] video. Patients will also be able to practice these guidelines  in a variety of provided scenarios. The purpose of this lesson is to provide patients with the opportunity to practice hands-on learning of the Pritikin Dining Out guidelines with actual menus and practice scenarios.  Label Reading Clinical staff led group instruction and group discussion with PowerPoint presentation and patient guidebook. To enhance  the learning environment the use of posters, models and videos may be added. Patients will review and discuss the Pritikin label reading guidelines presented in Pritikin's Label Reading Educational series video. Using fool labels brought in from local grocery stores and markets, patients will apply the label reading guidelines and determine if the packaged food meet the Pritikin guidelines. The purpose of this lesson is to provide patients with the opportunity to review, discuss, and practice hands-on learning of the Pritikin Label Reading guidelines with actual packaged food labels. Cooking School  Pritikin's [employer] are designed to teach patients ways to prepare quick, simple, and affordable recipes at home. The importance of nutrition's role in chronic disease risk reduction is reflected in its emphasis in the overall Pritikin program. By learning how to prepare essential core Pritikin Eating Plan recipes, patients will increase control over what they eat; be able to customize the flavor of foods without the use of added salt, sugar, or fat; and improve the quality of the food they consume. By learning a set of core recipes which are easily assembled, quickly prepared, and affordable, patients are more likely to prepare more healthy foods at home. These workshops focus on convenient breakfasts, simple entres, side dishes, and desserts which can be prepared with minimal effort and are consistent with nutrition recommendations for cardiovascular risk reduction. Cooking [employer] are taught by a Armed forces logistics/support/administrative officer (RD) who has  been trained by the [employer]. The chef or RD has a clear understanding of the importance of minimizing - if not completely eliminating - added fat, sugar, and sodium in recipes. Throughout the series of Cooking School Workshop sessions, patients will learn about healthy ingredients and efficient methods of cooking to build confidence in their capability to prepare    Cooking School weekly topics:  Adding Flavor- Sodium-Free  Fast and Healthy Breakfasts  Powerhouse Plant-Based Proteins  Satisfying Salads and Dressings  Simple Sides and Sauces  International Cuisine-Spotlight on the [employer] Zones  Delicious Desserts  Savory Soups  [employer] - Meals in a Astronomer Appetizers and Snacks  Comforting Weekend Breakfasts  One-Pot Wonders   Fast Evening Meals  Landscape architect Your Pritikin Plate  WORKSHOPS   Healthy Mindset (Psychosocial):  Focused Goals, Sustainable Changes Clinical staff led group instruction and group discussion with PowerPoint presentation and patient guidebook. To enhance the learning environment the use of posters, models and videos may be added. Patients will be able to apply effective goal setting strategies to establish at least one personal goal, and then take consistent, meaningful action toward that goal. They will learn to identify common barriers to achieving personal goals and develop strategies to overcome them. Patients will also gain an understanding of how our mind-set can impact our ability to achieve goals and the importance of cultivating a positive and growth-oriented mind-set. The purpose of this lesson is to provide patients with a deeper understanding of how to set and achieve personal goals, as well as the tools and strategies needed to overcome common obstacles which may arise along the way.  From Head to Heart: The Power of a Healthy Outlook  Clinical staff led group instruction and group discussion with  PowerPoint presentation and patient guidebook. To enhance the learning environment the use of posters, models and videos may be added. Patients will be able to recognize and describe the impact of emotions and mood on physical health. They will discover the importance of self-care and  explore self-care practices which may work for them. Patients will also learn how to utilize the 4 C's to cultivate a healthier outlook and better manage stress and challenges. The purpose of this lesson is to demonstrate to patients how a healthy outlook is an essential part of maintaining good health, especially as they continue their cardiac rehab journey.  Healthy Sleep for a Healthy Heart Clinical staff led group instruction and group discussion with PowerPoint presentation and patient guidebook. To enhance the learning environment the use of posters, models and videos may be added. At the conclusion of this workshop, patients will be able to demonstrate knowledge of the importance of sleep to overall health, well-being, and quality of life. They will understand the symptoms of, and treatments for, common sleep disorders. Patients will also be able to identify daytime and nighttime behaviors which impact sleep, and they will be able to apply these tools to help manage sleep-related challenges. The purpose of this lesson is to provide patients with a general overview of sleep and outline the importance of quality sleep. Patients will learn about a few of the most common sleep disorders. Patients will also be introduced to the concept of "sleep hygiene," and discover ways to self-manage certain sleeping problems through simple daily behavior changes. Finally, the workshop will motivate patients by clarifying the links between quality sleep and their goals of heart-healthy living.   Recognizing and Reducing Stress Clinical staff led group instruction and group discussion with PowerPoint presentation and patient guidebook. To  enhance the learning environment the use of posters, models and videos may be added. At the conclusion of this workshop, patients will be able to understand the types of stress reactions, differentiate between acute and chronic stress, and recognize the impact that chronic stress has on their health. They will also be able to apply different coping mechanisms, such as reframing negative self-talk. Patients will have the opportunity to practice a variety of stress management techniques, such as deep abdominal breathing, progressive muscle relaxation, and/or guided imagery.  The purpose of this lesson is to educate patients on the role of stress in their lives and to provide healthy techniques for coping with it.  Learning Barriers/Preferences:  Learning Barriers/Preferences - 07/09/22 1246       Learning Barriers/Preferences   Learning Barriers Sight;Hearing   wears glasses and hearing aids   Learning Preferences Written Material;Computer/Internet             Education Topics:  Knowledge Questionnaire Score:  Knowledge Questionnaire Score - 07/09/22 1244       Knowledge Questionnaire Score   Pre Score 22/24             Core Components/Risk Factors/Patient Goals at Admission:  Personal Goals and Risk Factors at Admission - 07/09/22 1244       Core Components/Risk Factors/Patient Goals on Admission   Diabetes Yes    Intervention Provide education about signs/symptoms and action to take for hypo/hyperglycemia.;Provide education about proper nutrition, including hydration, and aerobic/resistive exercise prescription along with prescribed medications to achieve blood glucose in normal ranges: Fasting glucose 65-99 mg/dL    Expected Outcomes Short Term: Participant verbalizes understanding of the signs/symptoms and immediate care of hyper/hypoglycemia, proper foot care and importance of medication, aerobic/resistive exercise and nutrition plan for blood glucose control.;Long Term:  Attainment of HbA1C < 7%.    Heart Failure Yes    Intervention Provide a combined exercise and nutrition program that is supplemented with education, support and counseling about heart  failure. Directed toward relieving symptoms such as shortness of breath, decreased exercise tolerance, and extremity edema.    Expected Outcomes Improve functional capacity of life;Short term: Attendance in program 2-3 days a week with increased exercise capacity. Reported lower sodium intake. Reported increased fruit and vegetable intake. Reports medication compliance.;Short term: Daily weights obtained and reported for increase. Utilizing diuretic protocols set by physician.;Long term: Adoption of self-care skills and reduction of barriers for early signs and symptoms recognition and intervention leading to self-care maintenance.    Hypertension Yes    Intervention Monitor prescription use compliance.;Provide education on lifestyle modifcations including regular physical activity/exercise, weight management, moderate sodium restriction and increased consumption of fresh fruit, vegetables, and low fat dairy, alcohol moderation, and smoking cessation.    Expected Outcomes Short Term: Continued assessment and intervention until BP is < 140/16mm HG in hypertensive participants. < 130/44mm HG in hypertensive participants with diabetes, heart failure or chronic kidney disease.;Long Term: Maintenance of blood pressure at goal levels.    Lipids Yes    Intervention Provide education and support for participant on nutrition & aerobic/resistive exercise along with prescribed medications to achieve LDL 70mg , HDL >40mg .    Expected Outcomes Short Term: Participant states understanding of desired cholesterol values and is compliant with medications prescribed. Participant is following exercise prescription and nutrition guidelines.;Long Term: Cholesterol controlled with medications as prescribed, with individualized exercise RX and with  personalized nutrition plan. Value goals: LDL < 70mg , HDL > 40 mg.             Core Components/Risk Factors/Patient Goals Review:   Goals and Risk Factor Review     Row Name 08/13/22 1547 09/10/22 1552 10/08/22 1530         Core Components/Risk Factors/Patient Goals Review   Personal Goals Review Weight Management/Obesity;Lipids;Diabetes;Stress;Hypertension;Heart Failure Weight Management/Obesity;Lipids;Diabetes;Stress;Hypertension;Heart Failure Weight Management/Obesity;Lipids;Diabetes;Stress;Hypertension;Heart Failure     Review Raymond Christensen is off to a good start to exercise at cardiac rehab. Blood pressures have improved since Dr Nelly Laurence discontinued Nijah's entresto. Raymond Christensen has lost 2.2 kg since starting cardiac rehab. Raymond Christensen is doing well with exercise at  cardiac rehab. Vital signs have been stable. Raymond Christensen has lost 1.2 kg since starting cardiac rehab Raymond Christensen Christensen to do well with exercise at  cardiac rehab. Vital signs have been stable. Raymond Christensen has lost 3.2  kg since starting cardiac rehab.     Expected Outcomes Raymond Christensen will continue to participate in cardiac rehab for exercise, nutrition and lifestyle modifications Raymond Christensen will continue to participate in cardiac rehab for exercise, nutrition and lifestyle modifications Raymond Christensen will continue to participate in cardiac rehab for exercise, nutrition and lifestyle modifications              Core Components/Risk Factors/Patient Goals at Discharge (Final Review):   Goals and Risk Factor Review - 10/08/22 1530       Core Components/Risk Factors/Patient Goals Review   Personal Goals Review Weight Management/Obesity;Lipids;Diabetes;Stress;Hypertension;Heart Failure    Review Raymond Christensen Christensen to do well with exercise at  cardiac rehab. Vital signs have been stable. Raymond Christensen has lost 3.2  kg since starting cardiac rehab.    Expected Outcomes Raymond Christensen will continue to participate in cardiac rehab for exercise, nutrition and lifestyle  modifications             ITP Comments:  ITP Comments     Row Name 07/09/22 4098 08/13/22 1545 09/10/22 1550 10/08/22 1527     ITP Comments Armanda Magic, MD: Medical Director.  Introduction to the [facility]/Intensive  Cardiac Rehab.  Initial orientation packet reviewed. 30 day ITP Review. Lyan is off to a good start to exercise at cardiac rehab 30 day ITP Review. Anshuman has good attendance and participation with exercise at cardiac rehab 30 day ITP Review. Kas Christensen to have  good attendance and participation with exercise at cardiac rehab             Comments: See ITP comments.Thayer Headings RN BSN

## 2022-10-08 NOTE — Patient Instructions (Signed)
Medication Instructions:  Your physician recommends that you continue on your current medications as directed. Please refer to the Current Medication list given to you today.  *If you need a refill on your cardiac medications before your next appointment, please call your pharmacy*  Lab Work: None ordered If you have labs (blood work) drawn today and your tests are completely normal, you will receive your results only by: MyChart Message (if you have MyChart) OR A paper copy in the mail If you have any lab test that is abnormal or we need to change your treatment, we will call you to review the results.  Follow-Up: At [facility], you and your health needs are our priority.  As part of our continuing mission to provide you with exceptional heart care, we have created designated Provider Care Teams.  These Care Teams include your primary Cardiologist (physician) and Advanced Practice Providers (APPs -  Physician Assistants and Nurse Practitioners) who all work together to provide you with the care you need, when you need it.  Your next appointment:   3 month(s)  Provider:   Christell Constant, MD    Other Instructions Check your blood pressure daily, 1 hr after morning medications for 2 weeks, keep a log and send [place] the readings through mychart at the end of the 2 weeks.   Low-Sodium Eating Plan Salt (sodium) helps you keep a healthy balance of fluids in your body. Too much sodium can raise your blood pressure. It can also cause fluid and waste to be held in your body. Your health care provider or dietitian may recommend a low-sodium eating plan if you have high blood pressure (hypertension), kidney disease, liver disease, or heart failure. Eating less sodium can help lower your blood pressure and reduce swelling. It can also protect your heart, liver, and kidneys. What are tips for following this plan? Reading food labels  Check food labels for the amount of sodium per  serving. If you eat more than one serving, you must multiply the listed amount by the number of servings. Choose foods with less than 140 milligrams (mg) of sodium per serving. Avoid foods with 300 mg of sodium or more per serving. Always check how much sodium is in a product, even if the label says "unsalted" or "no salt added." Shopping  Buy products labeled as "low-sodium" or "no salt added." Buy fresh foods. Avoid canned foods and pre-made or frozen meals. Avoid canned, cured, or processed meats. Buy breads that have less than 80 mg of sodium per slice. Cooking  Eat more home-cooked food. Try to eat less restaurant, buffet, and fast food. Try not to add salt when you cook. Use salt-free seasonings or herbs instead of table salt or sea salt. Check with your provider or pharmacist before using salt substitutes. Cook with plant-based oils, such as canola, sunflower, or olive oil. Meal planning When eating at a restaurant, ask if your food can be made with less salt or no salt. Avoid dishes labeled as brined, pickled, cured, or smoked. Avoid dishes made with soy sauce, miso, or teriyaki sauce. Avoid foods that have monosodium glutamate (MSG) in them. MSG may be added to some restaurant food, sauces, soups, bouillon, and canned foods. Make meals that can be grilled, baked, poached, roasted, or steamed. These are often made with less sodium. General information Try to limit your sodium intake to 1,500-2,300 mg each day, or the amount told by your provider. What foods should I eat? Fruits  Fresh, frozen, or canned fruit. Fruit juice. Vegetables Fresh or frozen vegetables. "No salt added" canned vegetables. "No salt added" tomato sauce and paste. Low-sodium or reduced-sodium tomato and vegetable juice. Grains Low-sodium cereals, such as oats, puffed wheat and rice, and shredded wheat. Low-sodium crackers. Unsalted rice. Unsalted pasta. Low-sodium bread. Whole grain breads and whole grain  pasta. Meats and other proteins Fresh or frozen meat, poultry, seafood, and fish. These should have no added salt. Low-sodium canned tuna and salmon. Unsalted nuts. Dried peas, beans, and lentils without added salt. Unsalted canned beans. Eggs. Unsalted nut butters. Dairy Milk. Soy milk. Cheese that is naturally low in sodium, such as ricotta cheese, fresh mozzarella, or Swiss cheese. Low-sodium or reduced-sodium cheese. Cream cheese. Yogurt. Seasonings and condiments Fresh and dried herbs and spices. Salt-free seasonings. Low-sodium mustard and ketchup. Sodium-free salad dressing. Sodium-free light mayonnaise. Fresh or refrigerated horseradish. Lemon juice. Vinegar. Other foods Homemade, reduced-sodium, or low-sodium soups. Unsalted popcorn and pretzels. Low-salt or salt-free chips. The items listed above may not be all the foods and drinks you can have. Talk to a dietitian to learn more. What foods should I avoid? Vegetables Sauerkraut, pickled vegetables, and relishes. Olives. [place] fries. Onion rings. Regular canned vegetables, except low-sodium or reduced-sodium items. Regular canned tomato sauce and paste. Regular tomato and vegetable juice. Frozen vegetables in sauces. Grains Instant hot cereals. Bread stuffing, pancake, and biscuit mixes. Croutons. Seasoned rice or pasta mixes. Noodle soup cups. Boxed or frozen macaroni and cheese. Regular salted crackers. Self-rising flour. Meats and other proteins Meat or fish that is salted, canned, smoked, spiced, or pickled. Precooked or cured meat, such as sausages or meat loaves. Tomasa Blase. Ham. Pepperoni. Hot dogs. Corned beef. Chipped beef. Salt pork. Jerky. Pickled herring, anchovies, and sardines. Regular canned tuna. Salted nuts. Dairy Processed cheese and cheese spreads. Hard cheeses. Cheese curds. Blue cheese. Feta cheese. String cheese. Regular cottage cheese. Buttermilk. Canned milk. Fats and oils Salted butter. Regular margarine. Ghee. Bacon  fat. Seasonings and condiments Onion salt, garlic salt, seasoned salt, table salt, and sea salt. Canned and packaged gravies. Worcestershire sauce. Tartar sauce. Barbecue sauce. Teriyaki sauce. Soy sauce, including reduced-sodium soy sauce. Steak sauce. Fish sauce. Oyster sauce. Cocktail sauce. Horseradish that you find on the shelf. Regular ketchup and mustard. Meat flavorings and tenderizers. Bouillon cubes. Hot sauce. Pre-made or packaged marinades. Pre-made or packaged taco seasonings. Relishes. Regular salad dressings. Salsa. Other foods Salted popcorn and pretzels. Corn chips and puffs. Potato and tortilla chips. Canned or dried soups. Pizza. Frozen entrees and pot pies. The items listed above may not be all the foods and drinks you should avoid. Talk to a dietitian to learn more. This information is not intended to replace advice given to you by your health care provider. Make sure you discuss any questions you have with your health care provider. Document Revised: 01/17/2022 Document Reviewed: 01/17/2022 Elsevier Patient Education  2024 [employer] Heart-Healthy Eating Plan Many factors influence your heart health, including eating and exercise habits. Heart health is also called coronary health. Coronary risk increases with abnormal blood fat (lipid) levels. A heart-healthy eating plan includes limiting unhealthy fats, increasing healthy fats, limiting salt (sodium) intake, and making other diet and lifestyle changes. What is my plan? Your health care provider may recommend that: You limit your fat intake to _________% or less of your total calories each day. You limit your saturated fat intake to _________% or less of your total calories each day. You limit the amount  of cholesterol in your diet to less than _________ mg per day. You limit the amount of sodium in your diet to less than _________ mg per day. What are tips for following this plan? Cooking Cook foods using methods other  than frying. Baking, boiling, grilling, and broiling are all good options. Other ways to reduce fat include: Removing the skin from poultry. Removing all visible fats from meats. Steaming vegetables in water or broth. Meal planning  At meals, imagine dividing your plate into fourths: Fill one-half of your plate with vegetables and green salads. Fill one-fourth of your plate with whole grains. Fill one-fourth of your plate with lean protein foods. Eat 2-4 cups of vegetables per day. One cup of vegetables equals 1 cup (91 g) broccoli or cauliflower florets, 2 medium carrots, 1 large bell pepper, 1 large sweet potato, 1 large tomato, 1 medium white potato, 2 cups (150 g) raw leafy greens. Eat 1-2 cups of fruit per day. One cup of fruit equals 1 small apple, 1 large banana, 1 cup (237 g) mixed fruit, 1 large orange,  cup (82 g) dried fruit, 1 cup (240 mL) 100% fruit juice. Eat more foods that contain soluble fiber. Examples include apples, broccoli, carrots, beans, peas, and barley. Aim to get 25-30 g of fiber per day. Increase your consumption of legumes, nuts, and seeds to 4-5 servings per week. One serving of dried beans or legumes equals  cup (90 g) cooked, 1 serving of nuts is  oz (12 almonds, 24 pistachios, or 7 walnut halves), and 1 serving of seeds equals  oz (8 g). Fats Choose healthy fats more often. Choose monounsaturated and polyunsaturated fats, such as olive and canola oils, avocado oil, flaxseeds, walnuts, almonds, and seeds. Eat more omega-3 fats. Choose salmon, mackerel, sardines, tuna, flaxseed oil, and ground flaxseeds. Aim to eat fish at least 2 times each week. Check food labels carefully to identify foods with trans fats or high amounts of saturated fat. Limit saturated fats. These are found in animal products, such as meats, butter, and cream. Plant sources of saturated fats include palm oil, palm kernel oil, and coconut oil. Avoid foods with partially hydrogenated oils  in them. These contain trans fats. Examples are stick margarine, some tub margarines, cookies, crackers, and other baked goods. Avoid fried foods. General information Eat more home-cooked food and less restaurant, buffet, and fast food. Limit or avoid alcohol. Limit foods that are high in added sugar and simple starches such as foods made using white refined flour (white breads, pastries, sweets). Lose weight if you are overweight. Losing just 5-10% of your body weight can help your overall health and prevent diseases such as diabetes and heart disease. Monitor your sodium intake, especially if you have high blood pressure. Talk with your health care provider about your sodium intake. Try to incorporate more vegetarian meals weekly. What foods should I eat? Fruits All fresh, canned (in natural juice), or frozen fruits. Vegetables Fresh or frozen vegetables (raw, steamed, roasted, or grilled). Green salads. Grains Most grains. Choose whole wheat and whole grains most of the time. Rice and pasta, including brown rice and pastas made with whole wheat. Meats and other proteins Lean, well-trimmed beef, veal, pork, and lamb. Chicken and [place] without skin. All fish and shellfish. Wild duck, rabbit, pheasant, and venison. Egg whites or low-cholesterol egg substitutes. Dried beans, peas, lentils, and tofu. Seeds and most nuts. Dairy Low-fat or nonfat cheeses, including ricotta and mozzarella. Skim or 1% milk (liquid,  powdered, or evaporated). Buttermilk made with low-fat milk. Nonfat or low-fat yogurt. Fats and oils Non-hydrogenated (trans-free) margarines. Vegetable oils, including soybean, sesame, sunflower, olive, avocado, peanut, safflower, corn, canola, and cottonseed. Salad dressings or mayonnaise made with a vegetable oil. Beverages Water (mineral or sparkling). Coffee and tea. Unsweetened ice tea. Diet beverages. Sweets and desserts Sherbet, gelatin, and fruit ice. Small amounts of dark  chocolate. Limit all sweets and desserts. Seasonings and condiments All seasonings and condiments. The items listed above may not be a complete list of foods and beverages you can eat. Contact a dietitian for more options. What foods should I avoid? Fruits Canned fruit in heavy syrup. Fruit in cream or butter sauce. Fried fruit. Limit coconut. Vegetables Vegetables cooked in cheese, cream, or butter sauce. Fried vegetables. Grains Breads made with saturated or trans fats, oils, or whole milk. Croissants. Sweet rolls. Donuts. High-fat crackers, such as cheese crackers and chips. Meats and other proteins Fatty meats, such as hot dogs, ribs, sausage, bacon, rib-eye roast or steak. High-fat deli meats, such as salami and bologna. Caviar. Domestic duck and goose. Organ meats, such as liver. Dairy Cream, sour cream, cream cheese, and creamed cottage cheese. Whole-milk cheeses. Whole or 2% milk (liquid, evaporated, or condensed). Whole buttermilk. Cream sauce or high-fat cheese sauce. Whole-milk yogurt. Fats and oils Meat fat, or shortening. Cocoa butter, hydrogenated oils, palm oil, coconut oil, palm kernel oil. Solid fats and shortenings, including bacon fat, salt pork, lard, and butter. Nondairy cream substitutes. Salad dressings with cheese or sour cream. Beverages Regular sodas and any drinks with added sugar. Sweets and desserts Frosting. Pudding. Cookies. Cakes. Pies. Milk chocolate or white chocolate. Buttered syrups. Full-fat ice cream or ice cream drinks. The items listed above may not be a complete list of foods and beverages to avoid. Contact a dietitian for more information. Summary Heart-healthy meal planning includes limiting unhealthy fats, increasing healthy fats, limiting salt (sodium) intake and making other diet and lifestyle changes. Lose weight if you are overweight. Losing just 5-10% of your body weight can help your overall health and prevent diseases such as diabetes and  heart disease. Focus on eating a balance of foods, including fruits and vegetables, low-fat or nonfat dairy, lean protein, nuts and legumes, whole grains, and heart-healthy oils and fats. This information is not intended to replace advice given to you by your health care provider. Make sure you discuss any questions you have with your health care provider. Document Revised: 02/05/2021 Document Reviewed: 02/05/2021 Elsevier Patient Education  2024 [employer].

## 2022-10-09 ENCOUNTER — Encounter (HOSPITAL_COMMUNITY)
Admission: RE | Admit: 2022-10-09 | Discharge: 2022-10-09 | Disposition: A | Discharge status: Home / Self Care | Payer: Medicare (Managed Care) | Source: Ambulatory Visit | Attending: Internal Medicine

## 2022-10-09 DIAGNOSIS — I252 Old myocardial infarction: Secondary | ICD-10-CM | POA: Diagnosis not present

## 2022-10-10 ENCOUNTER — Encounter: Payer: Self-pay | Admitting: Family Medicine

## 2022-10-10 DIAGNOSIS — N3941 Urge incontinence: Secondary | ICD-10-CM

## 2022-10-11 ENCOUNTER — Encounter (HOSPITAL_COMMUNITY): Payer: Medicare (Managed Care)

## 2022-10-11 ENCOUNTER — Telehealth (HOSPITAL_COMMUNITY): Payer: Self-pay | Admitting: *Deleted

## 2022-10-11 NOTE — Telephone Encounter (Signed)
Left voice mail message at 8:56am  that he will be out today.

## 2022-10-11 NOTE — Telephone Encounter (Signed)
Referral has been sent.

## 2022-10-14 ENCOUNTER — Encounter (HOSPITAL_COMMUNITY)
Admission: RE | Admit: 2022-10-14 | Discharge: 2022-10-14 | Disposition: A | Discharge status: Home / Self Care | Payer: Medicare (Managed Care) | Source: Ambulatory Visit | Attending: Internal Medicine | Admitting: Internal Medicine

## 2022-10-14 DIAGNOSIS — I252 Old myocardial infarction: Secondary | ICD-10-CM | POA: Diagnosis not present

## 2022-10-14 DIAGNOSIS — I214 Non-ST elevation (NSTEMI) myocardial infarction: Secondary | ICD-10-CM

## 2022-10-16 ENCOUNTER — Ambulatory Visit: Payer: Medicare (Managed Care) | Admitting: Urology

## 2022-10-16 ENCOUNTER — Encounter (HOSPITAL_COMMUNITY): Payer: Medicare (Managed Care)

## 2022-10-16 ENCOUNTER — Encounter: Payer: Self-pay | Admitting: Urology

## 2022-10-16 VITALS — BP 122/80 | Ht 67.5 in | Wt 200.0 lb

## 2022-10-16 DIAGNOSIS — N401 Enlarged prostate with lower urinary tract symptoms: Secondary | ICD-10-CM

## 2022-10-16 LAB — URINALYSIS, ROUTINE W REFLEX MICROSCOPIC
Bilirubin, UA: NEGATIVE
Glucose, UA: NEGATIVE
Ketones, UA: NEGATIVE
Leukocytes,UA: NEGATIVE
Nitrite, UA: NEGATIVE
RBC, UA: NEGATIVE
Specific Gravity, UA: >1.03 — ABNORMAL HIGH (ref 1.005–1.030)
Urobilinogen, Ur: 0.2 mg/dL (ref 0.2–1.0)
pH, UA: 5.5 (ref 5.0–7.5)

## 2022-10-16 LAB — MICROSCOPIC EXAMINATION: RBC, Urine: NONE SEEN /[HPF] (ref 0–2)

## 2022-10-16 MED ORDER — FINASTERIDE 5 MG PO TABS: 5.0000 mg | ORAL_TABLET | Freq: Every day | ORAL | 3 refills | Status: AC

## 2022-10-16 NOTE — Addendum Note (Signed)
Addended by: Carolin Coy on: 10/16/2022 01:22 PM   Modules accepted: Orders

## 2022-10-16 NOTE — Progress Notes (Signed)
Assessment: 1. Benign prostatic hyperplasia with lower urinary tract symptoms, symptom details unspecified      Plan: Today had a long and detailed discussion with the patient regarding his lower urinary tract symptoms.  Note is that he could not void during the visit today but recent UA at PCP was negative.  He will try to leave a specimen before leaving today.  I have recommended the following-- Bowel regimen-daily stool softener as well as MiraLAX as needed I discussed how constipation can affect his lower urinary tract symptoms.  We discussed the option of also restarting finasteride which will make him less likely to bleed in the future as he has had some issues with gross hematuria and is on Eliquis.  He would like to restart. Rx: Finasteride 5 mg daily  Also discussed with him conservative measures regarding his nocturia including afternoon and evening fluid restriction and recumbency.  Patient is going to follow-up with Dr. Mena Goes as scheduled in 3 months.  Can consider further treatment for OAB type symptoms at that time.  Chief Complaint: LUTS, constipaton  History of Present Illness:  Raymond Christensen is a 82 y.o. male who is seen in consultation from Sharlene Dory, DO for evaluation of LUTS. Patient has longstanding BPH with lower urinary tract symptoms and has been followed for several years at [facility] urology by Dr. Mena Goes.  Current IPSS = 15.  Patient's predominant complaints are nocturia as well as daytime urgency and rare urge incontinence.  He tried [place] earlier this summer for 2 weeks but noticed no improvement.  Today I reviewed his extensive prior records.  Yes In summary-- Status post water vapor thermal therapy x 6 in 2023.  Patient previously was on combination medical therapy with tamsulosin and finasteride for a number of years.  Status post TRUS biopsy-benign 2008.  65 g gland.  PSA 4.01.    ) BPH with LUTS - wvtt x 6 in 2023. Off  meds. BPH since '08. Took finasteride. He had a prostate biopsy that was benign. He has a h/o BPH with prostate 65 grams in 2008 with a PSA of 4.01. He started finasteride. PSA was 2.2 (4.4) in 2000 and 1.79 in 2010 and 1.6 in 2013. PSA 2.4 (4.8) 03/20 and 11/20 1.6 (3.2).   Last follow-up with Dr. Mena Goes July 2024--no dysuria or gross hematuria. PSA was 1.7 in 2023. He has nocturia. PVR 22 ml. Stream adequate.    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 Excell Seltzer   Subungual hematoma of digit of hand 12/01/2018   Subungual hematoma of right foot 12/01/2018   Type 2 diabetes mellitus without complication, without long-term current use of insulin (HCC) 02/26/2017    Past Surgical History:  Past Surgical History:  Procedure Laterality Date   ATRIAL FIBRILLATION ABLATION  N/A 02/01/2020   Procedure: ATRIAL FIBRILLATION ABLATION;  Surgeon: Regan Lemming, MD;  Location: [facility] INVASIVE CV LAB;  Service: Cardiovascular;  Laterality: N/A;   BYPASS GRAFT  2001   CARDIAC CATHETERIZATION     CARDIOVERSION N/A 11/08/2019   Procedure: CARDIOVERSION;  Surgeon: Wendall Stade, MD;  Location: [facility] ENDOSCOPY;  Service: Cardiovascular;  Laterality: N/A;   CAROTID ENDARTERECTOMY Left 01/17/2014   CORONARY ANGIOPLASTY     CORONARY ARTERY BYPASS GRAFT     LEFT HEART CATH AND CORS/GRAFTS  ANGIOGRAPHY N/A 06/26/2022   Procedure: LEFT HEART CATH AND CORS/GRAFTS ANGIOGRAPHY;  Surgeon: Lennette Bihari, MD;  Location: [facility] INVASIVE CV LAB;  Service: Cardiovascular;  Laterality: N/A;   MITRAL VALVE REPAIR N/A 12/23/2019   Procedure: MITRAL VALVE REPAIR;  Surgeon: Tonny Bollman, MD;  Location: [facility] INVASIVE CV LAB;  Service: Cardiovascular;  Laterality: N/A;   RIGHT/LEFT HEART CATH AND CORONARY/GRAFT ANGIOGRAPHY N/A 09/09/2019   Procedure: RIGHT/LEFT HEART CATH AND CORONARY/GRAFT ANGIOGRAPHY;  Surgeon: Runell Gess, MD;  Location: [facility] INVASIVE CV LAB;  Service: Cardiovascular;  Laterality: N/A;   TEE WITHOUT CARDIOVERSION N/A 09/13/2019   Procedure: TRANSESOPHAGEAL ECHOCARDIOGRAM (TEE);  Surgeon: Jodelle Red, MD;  Location: [facility] ENDOSCOPY;  Service: Cardiovascular;  Laterality: N/A;   TEE WITHOUT CARDIOVERSION N/A 12/23/2019   Procedure: TRANSESOPHAGEAL ECHOCARDIOGRAM (TEE);  Surgeon: Tonny Bollman, MD;  Location[facility] INVASIVE CV LAB;  Service: Cardiovascular;  Laterality: N/A;   TEE WITHOUT CARDIOVERSION  02/01/2020   Procedure: TRANSESOPHAGEAL ECHOCARDIOGRAM (TEE);  Surgeon: Regan Lemming, MD;  Location: [facility] INVASIVE CV LAB;  Service: Cardiovascular;;   TOTAL HIP ARTHROPLASTY  1994, 1995    Allergies:  Allergies  Allergen Reactions   Sulfa Antibiotics Rash    Questionable, he developed a diffuse rash 2 days after stopping Bactrim    Family History:  Family History  Problem Relation Age of Onset   Dementia Father        Concerns surrounding Lewy body dementia, but never officially diagnosed   Cancer Neg Hx     Social History:  Social History   Tobacco Use   Smoking status: Former    Current packs/day: 0.00    Types: Cigarettes    Quit date: 1980    Years since quitting: 44.7   Smokeless tobacco: Never  Vaping Use   Vaping status: Never Used  Substance Use Topics   Alcohol use: Yes    Alcohol/week: 4.0 standard drinks of alcohol    Types: 1 Cans of  beer, 3 Standard drinks or equivalent per week    Comment: 1 drink 3 times per week   Drug use: No    Review of symptoms:  Constitutional:  Negative for unexplained weight loss, night sweats, fever, chills ENT:  Negative for nose bleeds, sinus pain, painful swallowing CV:  Negative for chest pain, shortness of breath, exercise intolerance, palpitations, loss of consciousness Resp:  Negative for cough, wheezing, shortness of breath GI:  Negative for nausea, vomiting, diarrhea, bloody stools GU:  Positives noted in HPI; otherwise negative for gross hematuria, dysuria, urinary incontinence Neuro:  Negative for seizures, poor balance, limb weakness, slurred speech Psych:  Negative for lack of energy, depression, anxiety Endocrine:  Negative for polydipsia, polyuria, symptoms of hypoglycemia (dizziness, hunger, sweating) Hematologic:  Negative for anemia, purpura, petechia, prolonged or excessive bleeding, use of anticoagulants  Allergic:  Negative for difficulty breathing or choking as a result of exposure to anything; no shellfish allergy; no allergic response (rash/itch) to materials,  foods  Physical exam: BP 122/80   Ht 5' 7.5" (1.715 m)   Wt 200 lb (90.7 kg)   BMI 30.86 kg/m  GENERAL APPEARANCE:  Well appearing, well developed, well nourished, NAD

## 2022-10-18 ENCOUNTER — Encounter (HOSPITAL_COMMUNITY)
Admission: RE | Admit: 2022-10-18 | Discharge: 2022-10-18 | Disposition: A | Discharge status: Home / Self Care | Payer: Medicare (Managed Care) | Source: Ambulatory Visit | Attending: Internal Medicine | Admitting: Internal Medicine

## 2022-10-18 DIAGNOSIS — I214 Non-ST elevation (NSTEMI) myocardial infarction: Secondary | ICD-10-CM | POA: Insufficient documentation

## 2022-10-21 ENCOUNTER — Encounter (HOSPITAL_COMMUNITY)
Admission: RE | Admit: 2022-10-21 | Discharge: 2022-10-21 | Disposition: A | Discharge status: Home / Self Care | Payer: Medicare (Managed Care) | Source: Ambulatory Visit | Attending: Internal Medicine

## 2022-10-21 DIAGNOSIS — I214 Non-ST elevation (NSTEMI) myocardial infarction: Secondary | ICD-10-CM

## 2022-10-21 NOTE — Progress Notes (Signed)
3 beat run/ triplet. Rate 200.  Noted. Patient asymptomatic. Blood pressure 134/65 on nustep. Mr Raymond Christensen has frequent PVC's will notify onsite provider Joni Reining DNP about ectopy.Thayer Headings RN BSN

## 2022-10-23 ENCOUNTER — Encounter (HOSPITAL_COMMUNITY)
Admission: RE | Admit: 2022-10-23 | Discharge: 2022-10-23 | Disposition: A | Discharge status: Home / Self Care | Payer: Medicare (Managed Care) | Source: Ambulatory Visit | Attending: Internal Medicine | Admitting: Internal Medicine

## 2022-10-23 VITALS — Ht 67.0 in | Wt 202.8 lb

## 2022-10-23 DIAGNOSIS — I214 Non-ST elevation (NSTEMI) myocardial infarction: Secondary | ICD-10-CM

## 2022-10-23 NOTE — Progress Notes (Signed)
Discharge Progress Report  Patient Details  Name: Raymond Christensen MRN: [id number] Date of Birth: 1940/07/16 Referring Provider:   Flowsheet Row INTENSIVE CARDIAC REHAB ORIENT from 07/09/2022 in [facility]  Referring Provider [name], MD        Number of Visits: 56  Reason for Discharge:  Patient reached a stable level of exercise. Patient independent in their exercise. Patient has met program and personal goals.  Smoking History:  Social History   Tobacco Use  Smoking Status Former   Current packs/day: 0.00   Types: Cigarettes   Quit date: 1980   Years since quitting: 44.8  Smokeless Tobacco Never    Diagnosis:  06/25/22 NSTEMI (non-ST elevated myocardial infarction) [facility])  ADL UCSD:   Initial Exercise Prescription:  Initial Exercise Prescription - 07/09/22 1300       Date of Initial Exercise RX and Referring Provider   Date 07/09/22    Referring Provider [name], MD    Expected Discharge Date 09/20/22      NuStep   Level 1    SPM 80    Minutes 25    METs 2.9      Prescription Details   Frequency (times per week) 3    Duration Progress to 30 minutes of continuous aerobic without signs/symptoms of physical distress      Intensity   THRR 40-80% of Max Heartrate 56-111    Ratings of Perceived Exertion 11-13    Perceived Dyspnea 0-4      Progression   Progression Continue to progress workloads to maintain intensity without signs/symptoms of physical distress.      Resistance Training   Training Prescription Yes    Weight 3 lbs    Reps 10-15             Discharge Exercise Prescription (Final Exercise Prescription Changes):  Exercise Prescription Changes - 10/25/22 1600       Response to Exercise   Blood Pressure (Admit) 126/60    Blood Pressure (Exit) 110/64    Heart Rate (Admit) 64 bpm    Heart Rate (Exercise) 117 bpm    Heart Rate (Exit) 78 bpm    Rating  of Perceived Exertion (Exercise) 12    Symptoms None    Comments Pt graduated from the CRP2 program today.    Duration Continue with 30 min of aerobic exercise without signs/symptoms of physical distress.    Intensity THRR unchanged      Progression   Progression Continue to progress workloads to maintain intensity without signs/symptoms of physical distress.    Average METs 3.9      Resistance Training   Training Prescription Yes    Weight 5 lbs    Reps 10-15    Time 10 Minutes      Interval Training   Interval Training No      Recumbant Bike   Level 5    RPM 56    Minutes 15    METs 4.6      NuStep   Level 5    Minutes 15    METs 3.2      Home Exercise Plan   Plans to continue exercise at Home (comment)    Frequency Add 2 additional days to program exercise sessions.    Initial Home Exercises Provided 09/04/22             Functional Capacity:  6 Minute Walk     Row  Name 07/09/22 1057 10/23/22 1255 10/24/22 0839     6 Minute Walk   Phase Initial  Nustep used due to low blood pressure Discharge --   Distance 1837 feet  Nustep used due to low BP 2428 feet  Nustep Test --   Distance % Change -- 32.17 % --   Distance Feet Change -- 591 ft --   Walk Time 6 minutes 6 minutes --   # of Rest Breaks 0 0 --   MPH 3.5 4.6 --   METS 2.9 3.4 --   RPE 11 10 --   Perceived Dyspnea  0 0 --   VO2 Peak 10.1 15.5 --   Symptoms No No --   Resting HR 72 bpm 49 bpm --   Resting BP 104/61 120/60 --   Resting Oxygen Saturation  97 % -- --   Exercise Oxygen Saturation  during 6 min walk 97 % -- --   Max Ex. HR 105 bpm 114 bpm --   Max Ex. BP 114/56 162/60 --   2 Minute Post BP 97/56 -- --            Psychological, QOL, Others - Outcomes: PHQ 2/9:    10/23/2022    2:11 PM 10/07/2022    3:37 PM 07/31/2022   10:18 AM 07/09/2022   10:06 AM 05/29/2022   11:13 AM  Depression screen PHQ 2/9  Decreased Interest 0 0 0 0 0  Down, Depressed, Hopeless 0 0 0 0 0  PHQ - 2  Score 0 0 0 0 0  Altered sleeping 0 0  0 0  Tired, decreased energy 0 0  2 0  Change in appetite 0 0  1 0  Feeling bad or failure about yourself  0 0  1 0  Trouble concentrating 0 0  0 0  Moving slowly or fidgety/restless 0 0  0 0  Suicidal thoughts 0 0  0 0  PHQ-9 Score 0 0  4 0  Difficult doing work/chores Not difficult at all Not difficult at all  Not difficult at all Not difficult at all    Quality of Life:  Quality of Life - 10/22/22 1529       Quality of Life Scores   Health/Function Pre 19.23 %    Health/Function Post 23.77 %    Health/Function % Change 23.61 %    Socioeconomic Pre 27.5 %    Socioeconomic Post 26.43 %    Socioeconomic % Change  -3.89 %    Psych/Spiritual Pre 22.86 %    Psych/Spiritual Post 23.14 %    Psych/Spiritual % Change 1.22 %    Family Pre 27.6 %    Family Post 27.3 %    Family % Change -1.09 %    GLOBAL Pre 22.91 %    GLOBAL Post 24.71 %    GLOBAL % Change 7.86 %             Personal Goals: Goals established at orientation with interventions provided to work toward goal.  Personal Goals and Risk Factors at Admission - 07/09/22 1244       Core Components/Risk Factors/Patient Goals on Admission   Diabetes Yes    Intervention Provide education about signs/symptoms and action to take for hypo/hyperglycemia.;Provide education about proper nutrition, including hydration, and aerobic/resistive exercise prescription along with prescribed medications to achieve blood glucose in normal ranges: Fasting glucose 65-99 mg/dL    Expected Outcomes Short Term: Participant verbalizes understanding of the signs/symptoms  and immediate care of hyper/hypoglycemia, proper foot care and importance of medication, aerobic/resistive exercise and nutrition plan for blood glucose control.;Long Term: Attainment of HbA1C < 7%.    Heart Failure Yes    Intervention Provide a combined exercise and nutrition program that is supplemented with education, support and  counseling about heart failure. Directed toward relieving symptoms such as shortness of breath, decreased exercise tolerance, and extremity edema.    Expected Outcomes Improve functional capacity of life;Short term: Attendance in program 2-3 days a week with increased exercise capacity. Reported lower sodium intake. Reported increased fruit and vegetable intake. Reports medication compliance.;Short term: Daily weights obtained and reported for increase. Utilizing diuretic protocols set by physician.;Long term: Adoption of self-care skills and reduction of barriers for early signs and symptoms recognition and intervention leading to self-care maintenance.    Hypertension Yes    Intervention Monitor prescription use compliance.;Provide education on lifestyle modifcations including regular physical activity/exercise, weight management, moderate sodium restriction and increased consumption of fresh fruit, vegetables, and low fat dairy, alcohol moderation, and smoking cessation.    Expected Outcomes Short Term: Continued assessment and intervention until BP is < 140/87mm HG in hypertensive participants. < 130/49mm HG in hypertensive participants with diabetes, heart failure or chronic kidney disease.;Long Term: Maintenance of blood pressure at goal levels.    Lipids Yes    Intervention Provide education and support for participant on nutrition & aerobic/resistive exercise along with prescribed medications to achieve LDL 70mg , HDL >40mg .    Expected Outcomes Short Term: Participant states understanding of desired cholesterol values and is compliant with medications prescribed. Participant is following exercise prescription and nutrition guidelines.;Long Term: Cholesterol controlled with medications as prescribed, with individualized exercise RX and with personalized nutrition plan. Value goals: LDL < 70mg , HDL > 40 mg.              Personal Goals Discharge:  Goals and Risk Factor Review     Row Name  08/13/22 1547 09/10/22 1552 10/08/22 1530 10/31/22 0856       Core Components/Risk Factors/Patient Goals Review   Personal Goals Review Weight Management/Obesity;Lipids;Diabetes;Stress;Hypertension;Heart Failure Weight Management/Obesity;Lipids;Diabetes;Stress;Hypertension;Heart Failure Weight Management/Obesity;Lipids;Diabetes;Stress;Hypertension;Heart Failure Weight Management/Obesity;Lipids;Diabetes;Stress;Hypertension;Heart Failure    Review Crystian is off to a good start to exercise at cardiac rehab. Blood pressures have improved since Dr Nelly Laurence discontinued Yasser's entresto. Osmar has lost 2.2 kg since starting cardiac rehab. Wilmont is doing well with exercise at  cardiac rehab. Vital signs have been stable. Jahkye has lost 1.2 kg since starting cardiac rehab Joseramon continues to do well with exercise at  cardiac rehab. Vital signs have been stable. Markeise has lost 3.2  kg since starting cardiac rehab. Karn continues to do well with exercise at  cardiac rehab. Vital signs have been stable. Elrick has lost 4.2  kg since starting cardiac rehab. Romaine completed cardiac rehab on 10/25/22    Expected Outcomes Zaren will continue to participate in cardiac rehab for exercise, nutrition and lifestyle modifications Sebert will continue to participate in cardiac rehab for exercise, nutrition and lifestyle modifications Adolphus will continue to participate in cardiac rehab for exercise, nutrition and lifestyle modifications Nicollas will continue to exercise, follow  nutrition and lifestyle modifications upon completion of cardiac rehab.             Exercise Goals and Review:  Exercise Goals     Row Name 07/09/22 1252             Exercise Goals   Increase Physical Activity Yes  Intervention Provide advice, education, support and counseling about physical activity/exercise needs.;Develop an individualized exercise prescription for aerobic and resistive training based on initial  evaluation findings, risk stratification, comorbidities and participant's personal goals.       Expected Outcomes Short Term: Attend rehab on a regular basis to increase amount of physical activity.;Long Term: Add in home exercise to make exercise part of routine and to increase amount of physical activity.;Long Term: Exercising regularly at least 3-5 days a week.       Increase Strength and Stamina Yes       Intervention Provide advice, education, support and counseling about physical activity/exercise needs.;Develop an individualized exercise prescription for aerobic and resistive training based on initial evaluation findings, risk stratification, comorbidities and participant's personal goals.       Expected Outcomes Short Term: Increase workloads from initial exercise prescription for resistance, speed, and METs.;Short Term: Perform resistance training exercises routinely during rehab and add in resistance training at home;Long Term: Improve cardiorespiratory fitness, muscular endurance and strength as measured by increased METs and functional capacity ( )       Able to understand and use rate of perceived exertion (RPE) scale Yes       Intervention Provide education and explanation on how to use RPE scale       Expected Outcomes Short Term: Able to use RPE daily in rehab to express subjective intensity level;Long Term:  Able to use RPE to guide intensity level when exercising independently       Knowledge and understanding of Target Heart Rate Range (THRR) Yes       Intervention Provide education and explanation of THRR including how the numbers were predicted and where they are located for reference       Expected Outcomes Short Term: Able to state/look up THRR;Short Term: Able to use daily as guideline for intensity in rehab;Long Term: Able to use THRR to govern intensity when exercising independently       Understanding of Exercise Prescription Yes       Intervention Provide education,  explanation, and written materials on patient's individual exercise prescription       Expected Outcomes Short Term: Able to explain program exercise prescription;Long Term: Able to explain home exercise prescription to exercise independently                Exercise Goals Re-Evaluation:  Exercise Goals Re-Evaluation     Row Name 07/26/22 1412 08/23/22 1500 09/18/22 1500 10/09/22 1449 10/25/22 1636     Exercise Goal Re-Evaluation   Exercise Goals Review Increase Physical Activity;Understanding of Exercise Prescription;Increase Strength and Stamina;Knowledge and understanding of Target Heart Rate Range (THRR);Able to understand and use rate of perceived exertion (RPE) scale Increase Physical Activity;Understanding of Exercise Prescription;Increase Strength and Stamina;Knowledge and understanding of Target Heart Rate Range (THRR);Able to understand and use rate of perceived exertion (RPE) scale Increase Physical Activity;Understanding of Exercise Prescription;Increase Strength and Stamina;Knowledge and understanding of Target Heart Rate Range (THRR);Able to understand and use rate of perceived exertion (RPE) scale -- Increase Physical Activity;Understanding of Exercise Prescription;Increase Strength and Stamina;Knowledge and understanding of Target Heart Rate Range (THRR);Able to understand and use rate of perceived exertion (RPE) scale   Comments Pt's first day in the CRP2 program. Pt understands the exercise Rx, THRR and RPE scale. Reviewed METs and goals. Pt is making good progress on MET level, peak METs = 2.9.  Pt reports making progress on his goal of increased energy, strength, and stamina. Reviewed METs and  goals. Pt is making good progress on MET level, peak METs = 3.8.  Pt continues to report progress on his goal of increased energy, strength, and stamina. Pt feels he has done good things for his health and is reducung his risk of CAD. Reviewed METs with patient, pt is steadly increasing METs  in program and was motivated to increase his resistance to level 5 on the Nustep. Will continue to monitor and progress pt as tolerated. Pt averaged 3.4 METs today with a peak of 3.9. Pt graduated from the CRP2 program today. Pt had peak METs of 6.4. Pt vocies he has achieved his goals of increased energy, strenght and stamina. He also voices he is reucing his risk for CAD, which was alos a goal. The patient will continue his exercise by walking at home.   Expected Outcomes Will continue to montior patient and progress exercise workloads as tolerated. Will continue to montior patient and progress exercise workloads as tolerated. Will continue to montior patient and progress exercise workloads as tolerated. Pt will progress as tolerated. Pt will continue  to exercise at home.            Nutrition & Weight - Outcomes:  Pre Biometrics - 07/09/22 1253       Pre Biometrics   Waist Circumference 44.5 inches    Hip Circumference 47 inches    Waist to Hip Ratio 0.95 %    Triceps Skinfold 14 mm    % Body Fat 32 %    Grip Strength 31 kg    Flexibility 0 in   Cannot reach   Single Leg Stand 1 seconds             Post Biometrics - 10/23/22 1345        Post  Biometrics   Height 5\' 7"  (1.702 m)    Weight 92 kg    Waist Circumference 41.5 inches    Hip Circumference 46.25 inches    Waist to Hip Ratio 0.9 %    BMI (Calculated) 31.76    Triceps Skinfold 12 mm    % Body Fat 29.4 %    Grip Strength 38 kg    Flexibility 0 in   could not reach   Single Leg Stand 2.3 seconds             Nutrition:  Nutrition Therapy & Goals - 09/30/22 1318       Nutrition Therapy   Diet Heart Healthy Diet    Drug/Food Interactions Statins/Certain Fruits      Personal Nutrition Goals   Nutrition Goal Patient to identify strategies for reducing cardiovascular risk by attending the [facility] education and nutrition series weekly   goal in action.   Personal Goal #2 Patient to improve diet quality by  using the plate method as a guide for meal planning to include lean protein/plant protein, fruits, vegetables, whole grains, nonfat dairy as part of a well-balanced diet.   goal in action.   Comments Goals in action. Aryaveer reports no nutrition questions/concerns at this time; he has previously completed cardiac rehab in 12/2019 and 06/2021. He continues to attend the [facility] education and nutrition series regularly. He reports eating a wide variety of foods and eating three meals daily. His wife is a good support. He is taking Ozempic for weight loss and is down ~40# per documentation on 08/31/2021; he is down an additional 4.8# since starting with our program. Patient will benefit from participation in intensive cardiac rehab for  nutrition, exercise, and lifestyle modification.      Intervention Plan   Intervention Prescribe, educate and counsel regarding individualized specific dietary modifications aiming towards targeted core components such as weight, hypertension, lipid management, diabetes, heart failure and other comorbidities.;Nutrition handout(s) given to patient.    Expected Outcomes Short Term Goal: Understand basic principles of dietary content, such as calories, fat, sodium, cholesterol and nutrients.;Long Term Goal: Adherence to prescribed nutrition plan.             Nutrition Discharge:  Nutrition Assessments - 10/28/22 1446       Rate Your Plate Scores   Pre Score 63    Post Score 67             Education Questionnaire Score:  Knowledge Questionnaire Score - 10/22/22 1524       Knowledge Questionnaire Score   Post Score 24/24             Goals reviewed with patient; copy given to patient.Pt graduates from  Intensive/Traditional cardiac rehab program on 10/25/22  with completion of  34 exercise and  35 education sessions. Pt maintained good attendance and progressed nicely during their participation in rehab as evidenced by increased MET level. Lenny increased  his distance on his post exercise walk test by 591 feet and lost 4.2 kg!   Medication list reconciled. Repeat  PHQ score- 0 .  Pt has made significant lifestyle changes and should be commended for their success. Lenny achieved their goals during cardiac rehab.   Pt plans to continue exercise at home walking and may consider swimming again. We are proud of Lenny's progress!  Thayer Headings RN BSN

## 2022-10-25 ENCOUNTER — Ambulatory Visit: Payer: Medicare (Managed Care) | Admitting: Physician Assistant

## 2022-10-25 ENCOUNTER — Encounter (HOSPITAL_COMMUNITY)
Admission: RE | Admit: 2022-10-25 | Discharge: 2022-10-25 | Disposition: A | Discharge status: Home / Self Care | Payer: Medicare (Managed Care) | Source: Ambulatory Visit | Attending: Internal Medicine | Admitting: Internal Medicine

## 2022-10-25 DIAGNOSIS — I214 Non-ST elevation (NSTEMI) myocardial infarction: Secondary | ICD-10-CM

## 2022-10-26 IMAGING — CT CT HEART MORPH/PULM VEIN W/ CM & W/O CA SCORE
1 of 7 series · 8 of 20 positions shown, 10 images · IV contrast (APPLIED)
Comparison: Chest radiograph 12/21/2019.
COMPARISON: Chest radiograph 12/21/2019.

Addendum:
EXAM:
OVER-READ INTERPRETATION  CT CHEST

The following report is an over-read performed by radiologist Dr.
Abdelrahman Susana [REDACTED] on 01/26/2020. This over-read
does not include interpretation of cardiac or coronary anatomy or
pathology. The coronary CTA interpretation by the cardiologist is
attached.
CLINICAL DATA: Atrial fibrillation scheduled for an ablation.
Cardiac CT/CTA
TECHNIQUE: A 120 kV prospective scan was triggered in the ascending thoracic
aorta at 140 HU's. Gantry rotation speed was 250 msecs and
collimation was .6 mm. No beta blockade and no NTG was given. The 3D
data set was reconstructed for best systolic and diastolic phases
along with delayed images of the KAKI Images analyzed on a dedicated
work station using MPR, MIP and VRT modes. The patient received 80
cc of contrast.

[Series 6: best syst · axial · 0.41mm/px · z∈[+1272,+1370]mm · 8 of 315 slices shown, 10 images]
[im 35/315  vessel]
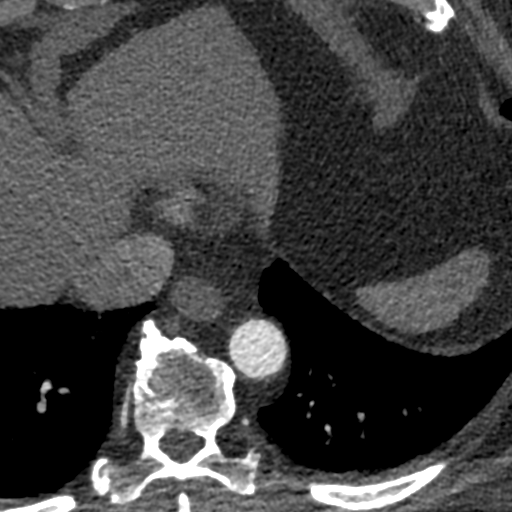
[im 35/315  lung]
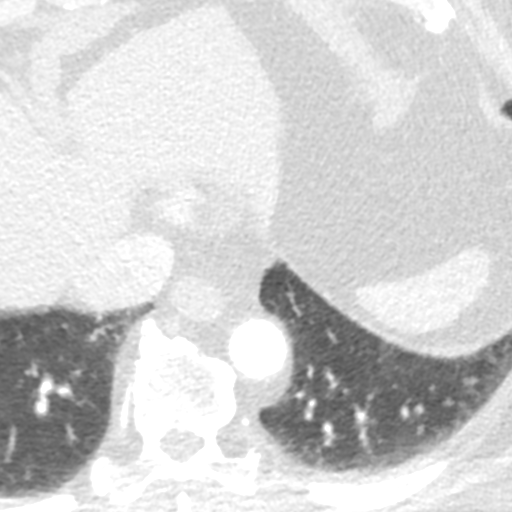
[im 70/315  vessel]
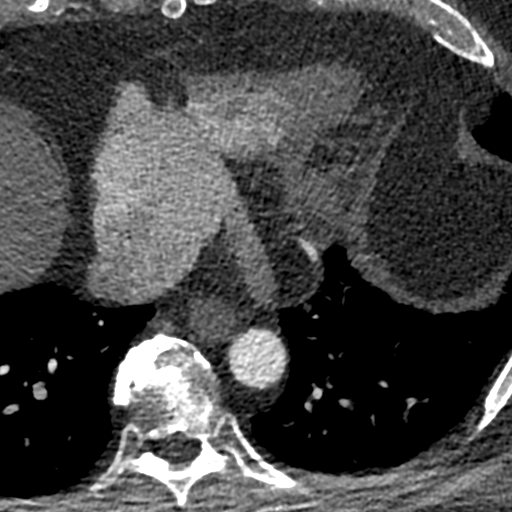
[im 105/315  vessel]
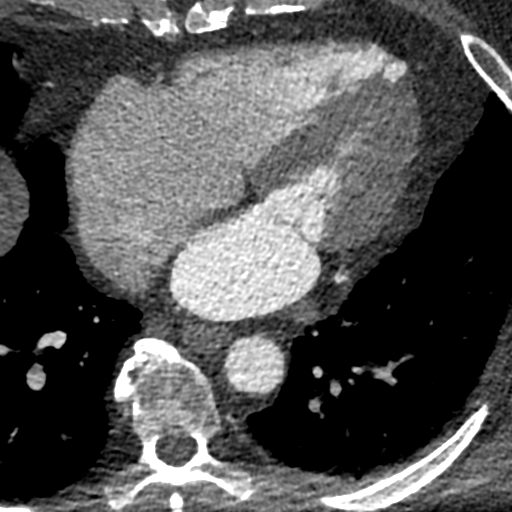
[im 140/315  vessel]
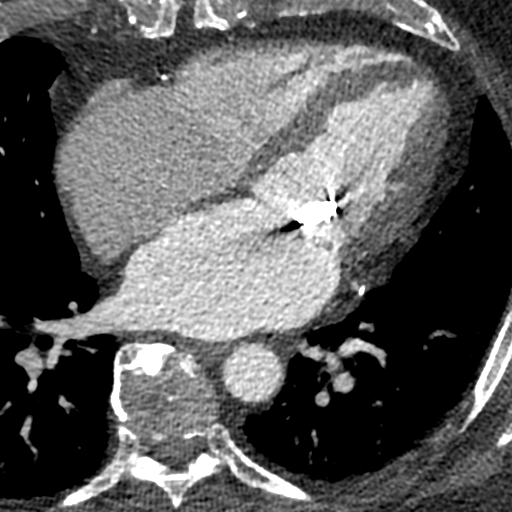
[im 175/315  vessel]
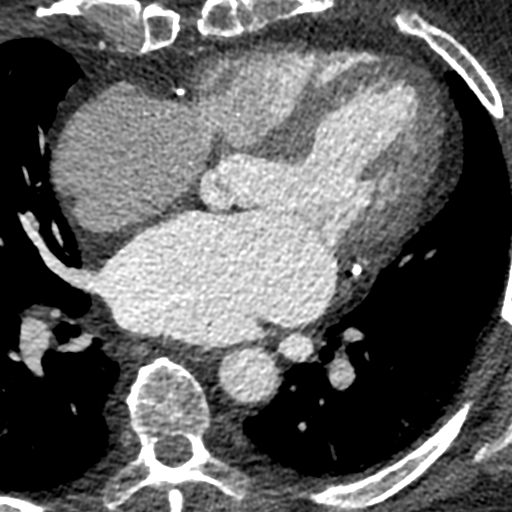
[im 175/315  lung]
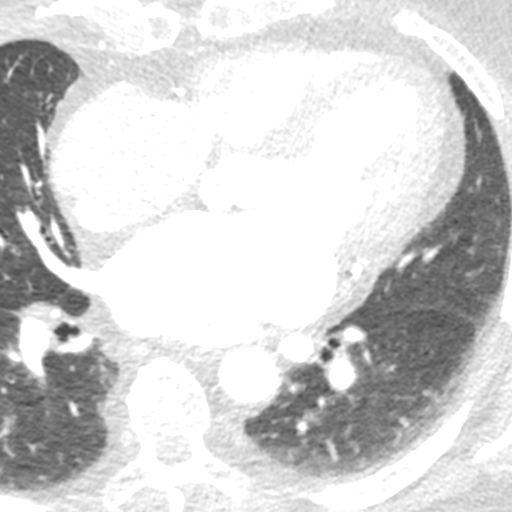
[im 210/315  vessel]
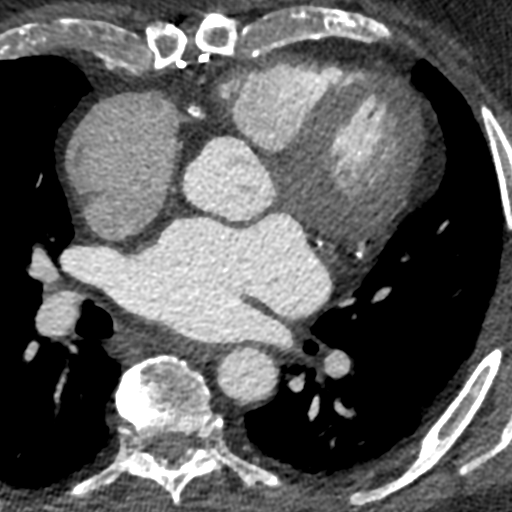
[im 245/315  vessel]
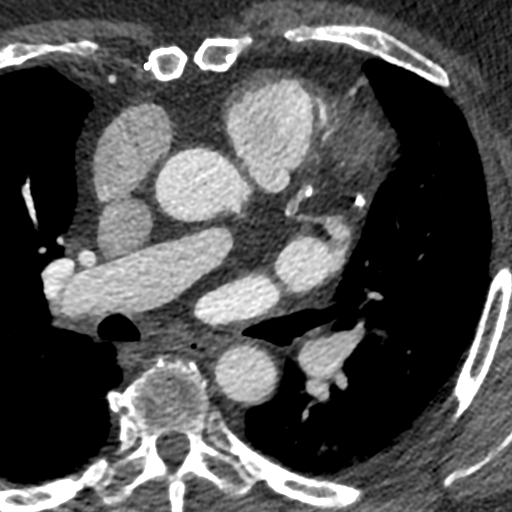
[im 280/315  vessel]
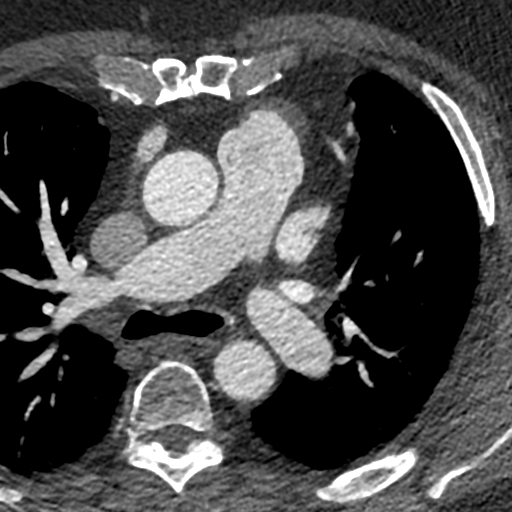

[8 of 20 positions shown; findings below may reference images not displayed]

FINDINGS: Vascular: Aortic atherosclerosis. No central pulmonary embolism, on
this non-dedicated study.

Mediastinum/Nodes: No imaged thoracic adenopathy. Tiny hiatal
hernia.

Lungs/Pleura: No pleural fluid. 2 mm anterior right lung base nodule
on [DATE].

Upper Abdomen: Normal imaged portions of the liver, spleen.

Musculoskeletal: Moderate mid and lower thoracic spondylosis.
IMPRESSION: 1. No acute findings in the imaged extracardiac chest.
2. 2 mm right lung nodule. No follow-up needed if patient is
low-risk. Non-contrast chest CT can be considered in 12 months if
patient is high-risk. This recommendation follows the consensus
statement: Guidelines for Management of Incidental Pulmonary Nodules
Detected on CT Images: From the [HOSPITAL] 1782; Radiology
3. Aortic Atherosclerosis (CCGY6-IMI.I).
4.  Tiny hiatal hernia.
FINDINGS: Image quality: excellent.

Noise artifact is: Limited.

Pulmonary Veins: There is normal pulmonary vein drainage into the
left atrium (2 on the right and 2 on the left) with ostial
measurements as follows:

RUPV: Ostium 24 mm x 21 mm  area 397 mm2

RLPV:  Ostium 20 mm x 16 mm  area 237 mm2

LUPV:  Ostium 23 mm x 16 mm area 285 mm2

LLPV:  Ostium 18 mm x 11 mm  area 158 mm2

Left Atrium: The left atrial size is dilated. There is a small
iatrogenic PFO related to prior NOGAREDO puncture from mitraclip
procedure with L to R shunting. The left atrial appendage is large
broccoli type with two lobes and ostial size 26.7 mm x 20.0 mm and
length 35 mm. There is no thrombus in the left atrial appendage on
contrast or delayed imaging. The esophagus runs in close proximity
to the left inferior pulmonary vein ostium.

Coronary Arteries: Coronary calcium scoring not performed due to
prior CABG/PCI. Normal coronary origin. Right dominance. The study
was performed without use of NTG and is insufficient for plaque
evaluation. A patent [REDACTED] to LAD graft is seen. A vein graft to an
OM branch is seen that is patent. 3-vessel calcifications noted.

Right Atrium: Right atrial size is dilated with contrast reflux into
the IVC suggestive of elevated RA pressure.

Right Ventricle: The right ventricular cavity is within normal
limits.

Left Ventricle: The ventricular cavity size is within normal limits.
There are no stigmata of prior infarction. There is no abnormal
filling defect.

Pericardium: Normal thickness with no significant effusion or
calcium present.

Pulmonary Artery: Normal caliber without proximal filling defect.

Cardiac valves: The aortic valve is trileaflet without significant
calcification. A mitraclip device is present.

Aorta: Normal caliber with no significant disease.

Extra-cardiac findings: See attached radiology report for
non-cardiac structures.
IMPRESSION: 1. There is normal pulmonary vein drainage into the left atrium with
ostial measurements above.

2. There is no thrombus in the left atrial appendage.

3. The esophagus runs in close proximity to the left inferior
pulmonary vein ostium.

4. There is a small iatrogenic PFO related to prior NOGAREDO puncture
from mitraclip procedure with L to R shunting.

5. Normal coronary origin. Right dominance.  S/p CABG/PCI.

6. Right atrial size is dilated with contrast reflux into the IVC
suggestive of elevated RA pressure.

*** End of Addendum ***
EXAM:
OVER-READ INTERPRETATION  CT CHEST

The following report is an over-read performed by radiologist Dr.
Abdelrahman Susana [REDACTED] on 01/26/2020. This over-read
does not include interpretation of cardiac or coronary anatomy or
pathology. The coronary CTA interpretation by the cardiologist is
attached.
FINDINGS: Vascular: Aortic atherosclerosis. No central pulmonary embolism, on
this non-dedicated study.

Mediastinum/Nodes: No imaged thoracic adenopathy. Tiny hiatal
hernia.

Lungs/Pleura: No pleural fluid. 2 mm anterior right lung base nodule
on [DATE].

Upper Abdomen: Normal imaged portions of the liver, spleen.

Musculoskeletal: Moderate mid and lower thoracic spondylosis.
IMPRESSION: 1. No acute findings in the imaged extracardiac chest.
2. 2 mm right lung nodule. No follow-up needed if patient is
low-risk. Non-contrast chest CT can be considered in 12 months if
patient is high-risk. This recommendation follows the consensus
statement: Guidelines for Management of Incidental Pulmonary Nodules
Detected on CT Images: From the [HOSPITAL] 1782; Radiology
3. Aortic Atherosclerosis (CCGY6-IMI.I).
4.  Tiny hiatal hernia.

## 2022-10-28 ENCOUNTER — Encounter (HOSPITAL_COMMUNITY): Payer: Medicare (Managed Care)

## 2022-11-07 ENCOUNTER — Other Ambulatory Visit: Payer: Self-pay | Admitting: Adult Health

## 2022-11-07 ENCOUNTER — Encounter: Payer: Self-pay | Admitting: Adult Health

## 2022-11-07 DIAGNOSIS — R310 Gross hematuria: Secondary | ICD-10-CM

## 2022-11-08 ENCOUNTER — Other Ambulatory Visit: Payer: Self-pay | Admitting: Family Medicine

## 2022-11-08 DIAGNOSIS — E1169 Type 2 diabetes mellitus with other specified complication: Secondary | ICD-10-CM

## 2022-11-19 ENCOUNTER — Ambulatory Visit (HOSPITAL_BASED_OUTPATIENT_CLINIC_OR_DEPARTMENT_OTHER)
Admission: RE | Admit: 2022-11-19 | Discharge: 2022-11-19 | Disposition: A | Discharge status: Home / Self Care | Payer: Medicare (Managed Care) | Source: Ambulatory Visit | Attending: Family Medicine | Admitting: Family Medicine

## 2022-11-19 ENCOUNTER — Encounter: Payer: Self-pay | Admitting: Family Medicine

## 2022-11-19 ENCOUNTER — Ambulatory Visit: Payer: Medicare (Managed Care) | Admitting: Family Medicine

## 2022-11-19 VITALS — BP 112/60 | HR 77 | Ht 67.0 in | Wt 200.0 lb

## 2022-11-19 DIAGNOSIS — W19XXXA Unspecified fall, initial encounter: Secondary | ICD-10-CM | POA: Insufficient documentation

## 2022-11-19 DIAGNOSIS — M25532 Pain in left wrist: Secondary | ICD-10-CM

## 2022-11-19 NOTE — Progress Notes (Signed)
Acute Office Visit  Subjective:     Patient ID: Raymond Christensen, male    DOB: Dec 29, 1940, 82 y.o.   MRN: [id number]  Chief Complaint  Patient presents with   Fall    Fall   Patient is in today for a fall with injuries. His wife is with him today.   Discussed the use of AI scribe software for clinical note transcription with the patient, who gave verbal consent to proceed.  History of Present Illness   The patient presented with a fall that occurred the previous night while carrying a bag of items to the curb. The fall resulted in an impact to the mouth and chin area, with the teeth cutting into the inside of the lip. The patient also extended his hands during the fall, which has resulted in significant pain in the left wrist, particularly when moved or pressure is applied. The wrist pain is rated as an 8 on a scale of 0-10 when the wrist is moved, but is not noticeable when the wrist is still.  The patient also reports a swollen lip, which he describes as having a 'full' feeling, but denies any loose or broken teeth. There is no reported pain in the jaw or chin area, and the patient denies any head impact, dizziness, lightheadedness, or headache. The patient also denies any difficulty in moving the jaw.  The patient's left wrist appears to be slightly swollen, and is tender to touch. Despite the pain, the patient is able to squeeze fingers (weakened grip due to pain), wiggle all fingers. The patient denies any pain in the right wrist.  He is currently on Eliquis (A.fib).          ROS All review of systems negative except what is listed in the HPI      Objective:    BP 112/60   Pulse 77   Ht 5\' 7"  (1.702 m)   Wt 200 lb (90.7 kg)   SpO2 97%   BMI 31.32 kg/m    Physical Exam Vitals reviewed.  Constitutional:      Appearance: Normal appearance.  Cardiovascular:     Rate and Rhythm: Normal rate and regular rhythm.     Pulses: Normal pulses.  Pulmonary:     Effort:  Pulmonary effort is normal.     Breath sounds: Normal breath sounds.  Musculoskeletal:     Comments: Left wrist with mild swelling and tenderness to palpation. Normal ROM, but weakened grip due to pain.  Skin:    General: Skin is warm and dry.     Findings: Bruising present.     Comments: See picture of lip/chin bruising, swelling, abrasion   Neurological:     Mental Status: He is alert and oriented to person, place, and time.  Psychiatric:        Mood and Affect: Mood normal.        Behavior: Behavior normal.        Thought Content: Thought content normal.               No results found for any visits on 11/19/22.      Assessment & Plan:   Problem List Items Addressed This Visit   None Visit Diagnoses     Fall, initial encounter    -  Primary   Relevant Orders   DG Wrist Complete Left (Completed)   Left wrist pain       Relevant Orders   DG Wrist Complete Left (Completed)  Pain and swelling following a fall. Pain increases with movement and palpation. No visible bruising. Circulation and sensation intact. -Order wrist X-ray to rule out fracture. -Advise patient to avoid putting pressure on the wrist. -Recommend purchase of a wrist brace for immobilization. -Advise icing multiple times a day. -Continue with Tylenol - declined any additional pain management at this time, but will reach out if pain is uncontrolled.  Swelling and pain in the lip following a fall. No loose teeth or tongue injury. No difficulty moving the jaw. -Advise icing multiple times a day. -Continue monitoring for changes in pain or swelling.        No orders of the defined types were placed in this encounter.   Return if symptoms worsen or fail to improve, for (pending results).  Clayborne Dana, NP

## 2022-11-19 NOTE — Addendum Note (Signed)
Addended by: Hyman Hopes B on: 11/19/2022 01:34 PM   Modules accepted: Orders

## 2022-11-20 ENCOUNTER — Ambulatory Visit (INDEPENDENT_AMBULATORY_CARE_PROVIDER_SITE_OTHER): Payer: Medicare (Managed Care) | Admitting: Physician Assistant

## 2022-11-20 ENCOUNTER — Encounter: Payer: Self-pay | Admitting: Physician Assistant

## 2022-11-20 DIAGNOSIS — M25532 Pain in left wrist: Secondary | ICD-10-CM

## 2022-11-20 NOTE — Progress Notes (Signed)
Office Visit Note   Patient: Raymond Christensen           Date of Birth: 12-May-1940           MRN: [id number] Visit Date: 11/20/2022              Requested by: Clayborne Dana, NP [address] [place],  [place] [address] PCP: Sharlene Dory, DO   Assessment & Plan: Visit Diagnoses:  1. Pain in left wrist     Plan: Patient is a right hand dominant gentleman who fell onto his outstretched hand yesterday.  He had pain in his ulnar side of his left wrist.  He was seen in urgent care by his primary care provider diagnosed with a possible nondisplaced triquetrum fracture.  He does have some tenderness mild to moderate soft tissue swelling tenderness is focused over the area of the triquetrum.  He has good opposition of his fingers brisk capillary refill and is neurovascularly intact.  I reviewed his x-rays with Dr. August Saucer.  He recommends immobilization in a removable wrist splint for 2 weeks we will see him back at that time for new x-rays.  X-rays were reviewed today independently did not show any widening in any of the intervals in the carpals.  Follow-Up Instructions: No follow-ups on file.   Orders:  No orders of the defined types were placed in this encounter.  No orders of the defined types were placed in this encounter.     Procedures: No procedures performed   Clinical Data: No additional findings.   Subjective: No chief complaint on file.   HPI patient is a pleasant 82 year old gentleman who is right-hand dominant he is on chronic anticoagulation.  He fell onto his outstretched hand yesterday.  He has been evaluated by his primary care physician.  He comes in today for follow-up on an x-ray that was concerning for triquetrum nondisplaced fracture  Review of Systems  All other systems reviewed and are negative.    Objective: Vital Signs: There were no vitals taken for this visit.  Physical Exam Constitutional:      Appearance: Normal  appearance.  Pulmonary:     Effort: Pulmonary effort is normal.  Skin:    General: Skin is warm and dry.  Neurological:     General: No focal deficit present.     Mental Status: He is alert and oriented to person, place, and time.  Psychiatric:        Mood and Affect: Mood normal.        Behavior: Behavior normal.     Ortho Exam Examination of his left hand he is neurovascular intact strong radial pulse he has good opposition of all his fingers he does have some tenderness to palpation over the ulnar aspect of his wrist he can extend and flex his wrist.  No tenderness over the radius has good extension flexion of his elbow Specialty Comments:  No specialty comments available.  Imaging: No results found.   PMFS History: Patient Active Problem List   Diagnosis Date Noted   Pain in left wrist 11/20/2022   S/P CABG (coronary artery bypass graft) 06/27/2022   Hypokalemia 06/27/2022   NSTEMI (non-ST elevated myocardial infarction) (HCC) 06/26/2022   PVC's (premature ventricular contractions) 05/31/2022   Lipoma of right upper extremity 03/02/2021   Trigger little finger of left hand 09/29/2020   Secondary hypercoagulable state (HCC) 02/29/2020   Depression, major, single episode, moderate (HCC) 02/28/2020  Nonrheumatic tricuspid valve regurgitation    S/P mitral valve clip implantation 12/23/2019   Mitral valve disease 12/23/2019   Nonrheumatic mitral valve regurgitation    Persistent atrial fibrillation (HCC)    Chronic HFrEF    Acute idiopathic gout involving toe of left foot    Thrombocytopenia (HCC) 09/08/2019   CAD (coronary artery disease)    Type 2 diabetes mellitus with obesity (HCC) 06/22/2019   Major depressive disorder    Obstructive sleep apnea on CPAP    Hypothyroidism 10/15/2017   Subclavian artery stenosis, right (HCC) 06/15/2017   Severe obesity (BMI 35.0-39.9) with comorbidity (HCC) 02/26/2017   Essential hypertension 02/26/2017   Memory change  01/10/2017   Urge incontinence of urine 01/10/2017   Lightheaded 06/25/2016   AK (actinic keratosis) 01/03/2016   H/O syncope 03/06/2014   Occlusion and stenosis of left carotid artery 01/11/2014   Routine history and physical examination of adult 11/08/2013   History of repair of hip joint 04/20/2013   Hyperlipidemia 04/20/2013   Presence of artificial hip joint 04/20/2013   Vitamin D deficiency 06/18/2010   Concussion 01/24/2010   Elevated prostate specific antigen (PSA) 01/01/2007   Benign prostatic hyperplasia without urinary obstruction 04/22/2006   Osteoarthrosis 04/22/2006   Dyslipidemia 04/02/2006   Past Medical History:  Diagnosis Date   Arthritis    CAD (coronary artery disease)    Carotid artery disease (HCC)    s/p left CEA 01/17/14   Chronic systolic CHF (congestive heart failure) (HCC)    Concussion Summer 2012   Essential hypertension 02/26/2017   Heart disease    Hyperlipidemia    Hypothyroidism 10/15/2017   Major depressive disorder    Mitral valve regurgitation    Myocardial infarction (HCC) 1996, 2021   Obstructive sleep apnea on CPAP    Persistent atrial fibrillation (HCC)    Poor flexibility of tendon 12/01/2018   S/P mitral valve clip implantation 12/23/2019   s/p TEER with MitraClip with one XTW by Dr. Excell Seltzer   Subungual hematoma of digit of hand 12/01/2018   Subungual hematoma of right foot 12/01/2018   Type 2 diabetes mellitus without complication, without long-term current use of insulin (HCC) 02/26/2017    Family History  Problem Relation Age of Onset   Dementia Father        Concerns surrounding Lewy body dementia, but never officially diagnosed   Cancer Neg Hx     Past Surgical History:  Procedure Laterality Date   ATRIAL FIBRILLATION ABLATION N/A 02/01/2020   Procedure: ATRIAL FIBRILLATION ABLATION;  Surgeon: Regan Lemming, MD;  Location: [facility] INVASIVE CV LAB;  Service: Cardiovascular;  Laterality: N/A;   BYPASS GRAFT  2001   CARDIAC  CATHETERIZATION     CARDIOVERSION N/A 11/08/2019   Procedure: CARDIOVERSION;  Surgeon: Wendall Stade, MD;  Location: [facility] ENDOSCOPY;  Service: Cardiovascular;  Laterality: N/A;   CAROTID ENDARTERECTOMY Left 01/17/2014   CORONARY ANGIOPLASTY     CORONARY ARTERY BYPASS GRAFT     LEFT HEART CATH AND CORS/GRAFTS ANGIOGRAPHY N/A 06/26/2022   Procedure: LEFT HEART CATH AND CORS/GRAFTS ANGIOGRAPHY;  Surgeon: Lennette Bihari, MD;  Location: [facility] INVAS[facility] CV LAB;  Service: Cardiovascular;  Laterality: N/A;   RIGHT/LEFT HEART CATH AND CORONARY/GRAFT ANGIOGRAPHY N/A 09/09/2019   Procedure: RIGHT/LEFT HEART CATH AND CORONARY/GRAFT ANGIOGRAPHY;  Surgeon: Runell Gess, MD;  Location: [facility];  Service: Cardiovascular;  Laterality: N/A;   TEE WITHOUT CARDIOVERSION N/A 09/13/2019   Procedure: TRANSESOPHAGEAL ECHOCARDIOGRAM (TEE);  Surgeon:  Jodelle Red, MD;  Location[facility] ENDOSCOPY;  Service: Cardiovascular;  Laterality: N/A;   TEE WITHOUT CARDIOVERSION N/A 12/23/2019   Procedure: TRANSESOPHAGEAL ECHOCARDIOGRAM (TEE);  Surgeon: Tonny Bollman, MD;  Location: [facility] INVASIVE CV LAB;  Service: Cardiovascular;  Laterality: N/A;   TEE WITHOUT CARDIOVERSION  02/01/2020   Procedure: TRANSESOPHAGEAL ECHOCARDIOGRAM (TEE);  Surgeon: Regan Lemming, MD;  Location: [facility] INVASIVE CV LAB;  Service: Cardiovascular;;   TOTAL HIP ARTHROPLASTY  1994, 1995   TRANSCATHETER MITRAL EDGE TO EDGE REPAIR N/A 12/23/2019   Procedure: MITRAL VALVE REPAIR;  Surgeon: Tonny Bollman, MD;  Location: [facility] INVASIVE CV LAB;  Service: Cardiovascular;  Laterality: N/A;   Social History   Occupational History   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  Tobacco Use   Smoking status: Former    Current packs/day: 0.00    Types: Cigarettes    Quit date: 1980    Years since quitting: 44.8   Smokeless tobacco: Never  Vaping Use   Vaping status: Never Used  Substance and Sexual Activity   Alcohol use: Yes    Alcohol/week: 4.0 standard drinks of  alcohol    Types: 1 Cans of beer, 3 Standard drinks or equivalent per week    Comment: 1 drink 3 times per week   Drug use: No   Sexual activity: Yes

## 2022-11-22 ENCOUNTER — Ambulatory Visit
Admission: RE | Admit: 2022-11-22 | Discharge: 2022-11-22 | Disposition: A | Discharge status: Home / Self Care | Payer: Medicare (Managed Care) | Source: Ambulatory Visit | Attending: Adult Health | Admitting: Adult Health

## 2022-11-22 DIAGNOSIS — R310 Gross hematuria: Secondary | ICD-10-CM

## 2022-11-22 MED ORDER — IOPAMIDOL (ISOVUE-300) INJECTION 61%
500.0000 mL | Freq: Once | INTRAVENOUS | Status: AC | PRN
Start: 2022-11-22 — End: 2022-11-22
  Administered 2022-11-22: 85 mL via INTRAVENOUS

## 2022-11-26 ENCOUNTER — Ambulatory Visit: Payer: Medicare (Managed Care) | Admitting: Family Medicine

## 2022-11-26 ENCOUNTER — Encounter: Payer: Self-pay | Admitting: Family Medicine

## 2022-11-26 VITALS — BP 133/73 | HR 74 | Ht 67.0 in | Wt 200.0 lb

## 2022-11-26 DIAGNOSIS — K130 Diseases of lips: Secondary | ICD-10-CM | POA: Diagnosis not present

## 2022-11-26 MED ORDER — AMOXICILLIN-POT CLAVULANATE 875-125 MG PO TABS: 1.0000 | ORAL_TABLET | Freq: Two times a day (BID) | ORAL | 0 refills | Status: DC

## 2022-11-26 NOTE — Progress Notes (Signed)
   Acute Office Visit  Subjective:     Patient ID: Raymond Christensen, male    DOB: 05/25/1940, 82 y.o.   MRN: [id number]  Chief Complaint  Patient presents with   Wrist Injury     Patient is in today for lip pain after fall.   Discussed the use of AI scribe software for clinical note transcription with the patient, who gave verbal consent to proceed.  History of Present Illness   The patient presents with a swollen left lip (related to fall last week), which has been causing significant discomfort. The swelling has extended down the neck. The patient reports that the lip was very painful until early this morning, but the pain has since subsided slightly. The right side of the lip has shown improvement with reduced swelling, but the left side remains puffy and hard. The patient denies any drainage or unusual taste.   The fall resulted in a bruise on the ribs and wrist fracture (following with ortho). The patient denies any rib pain or difficulty breathing. The patient is on blood thinners.           ROS All review of systems negative except what is listed in the HPI      Objective:    BP 133/73   Pulse 74   Ht 5\' 7"  (1.702 m)   Wt 200 lb (90.7 kg)   SpO2 99%   BMI 31.32 kg/m    Physical Exam Vitals reviewed.  Constitutional:      Appearance: Normal appearance.  HENT:     Mouth/Throat:     Comments: See picture of lip - left inner lower lip with induration and slough, some purulent drainage on palpation, tender Skin:    Findings: Bruising present.  Neurological:     Mental Status: He is alert and oriented to person, place, and time.  Psychiatric:        Mood and Affect: Mood normal.        Behavior: Behavior normal.        Thought Content: Thought content normal.        Judgment: Judgment normal.              No results found for any visits on 11/26/22.      Assessment & Plan:   Problem List Items Addressed This Visit   None Visit Diagnoses      Infection of lip    -  Primary   Relevant Medications   amoxicillin-clavulanate (AUGMENTIN) 875-125 MG tablet        Lip Swelling Likely secondary to trauma from a fall. Noted to be indurated and possibly infected with some purulent drainage. No systemic symptoms reported. -Prescribe Augmentin . -Advise warm compresses and warm salt water gargles. -Check progress in a few days. If not improving, re-evaluate.      Patient aware of signs/symptoms requiring further/urgent evaluation.   Meds ordered this encounter  Medications   amoxicillin-clavulanate (AUGMENTIN) 875-125 MG tablet    Sig: Take 1 tablet by mouth 2 (two) times daily.    Dispense:  20 tablet    Refill:  0    Order Specific Question:   Supervising Provider    Answer:   Danise Edge A [[id number]]    Return if symptoms worsen or fail to improve.  Clayborne Dana, NP

## 2022-11-28 ENCOUNTER — Telehealth: Payer: Self-pay | Admitting: Family Medicine

## 2022-11-28 NOTE — Telephone Encounter (Signed)
Antibiotic just started two days ago, please advise.

## 2022-11-28 NOTE — Telephone Encounter (Signed)
Pt states the antibiotics called in for his lip infection is not working and he would like to know what else he can do.

## 2022-11-29 NOTE — Telephone Encounter (Signed)
Patient made aware of recommendation and expressed understanding.

## 2022-11-29 NOTE — Telephone Encounter (Signed)
Could be a little early to tell. Continue warm compresses and monitoring. Appointment if not improving to reassess.

## 2022-12-04 ENCOUNTER — Telehealth: Payer: Self-pay | Admitting: Family Medicine

## 2022-12-04 ENCOUNTER — Encounter: Payer: Self-pay | Admitting: Physician Assistant

## 2022-12-04 ENCOUNTER — Ambulatory Visit: Payer: Medicare (Managed Care) | Admitting: Physician Assistant

## 2022-12-04 ENCOUNTER — Other Ambulatory Visit (INDEPENDENT_AMBULATORY_CARE_PROVIDER_SITE_OTHER): Payer: Self-pay

## 2022-12-04 DIAGNOSIS — M25532 Pain in left wrist: Secondary | ICD-10-CM | POA: Diagnosis not present

## 2022-12-04 NOTE — Telephone Encounter (Signed)
Pt called to make an appt with provider to "discuss updates about his health that need straightening out" but he wants his wife's appointment to be at the same time. Explained the provider's next opening where we could fit both of them would be 12/9 but pt said he needs answers before then. Pt repeatedly asked his wife to be included at the same time. He has been scheduled for 12/3. Please advise if wife can be added at same time

## 2022-12-04 NOTE — Progress Notes (Signed)
Office Visit Note   Patient: Raymond Christensen           Date of Birth: 1940/10/03           MRN: [id number] Visit Date: 12/04/2022              Requested by: Sharlene Dory, DO [address] [place],  [place] [address] PCP: Sharlene Dory, DO  Patient is a 82 year old gentleman who is now 2 weeks status post falling onto his outstretched arm x-rays were suspicious as was clinical exam for an acute nondisplaced triquetrum fracture.  I did review his x-rays with Dr. August Saucer at the time who recommended a removable wrist splint and rex-ray today    HPI: Patient is a pleasant 82 year old gentleman who is now 2 weeks status post fall onto outstretched hand he has been wearing his wrist brace 24 hours a day except for coming out to bathe.  Assessment & Plan: Visit Diagnoses: Left wrist triquetrum fracture nondisplaced  Plan: Emphasized the importance of continuing to use his brace full-time will follow-up in 2 weeks at which time x-ray should be taken  Follow-Up Instructions: No follow-ups on file.   Ortho Exam  Patient is alert, oriented, no adenopathy, well-dressed, normal affect, normal respiratory effort. Examination of his left wrist he has a strong radial pulse no swelling no erythema not really tender at all over the area of the fracture station intact  Imaging: No results found. No images are attached to the encounter.  Labs: Lab Results  Component Value Date   HGBA1C 5.4 06/26/2022   HGBA1C 5.8 (H) 05/03/2022   HGBA1C 6.4 08/31/2021   LABURIC 4.7 02/11/2022   LABURIC 6.5 03/03/2020   LABURIC 6.3 09/11/2019     Lab Results  Component Value Date   ALBUMIN 4.1 09/30/2021   ALBUMIN 4.3 08/31/2021   ALBUMIN 4.4 04/16/2021    Lab Results  Component Value Date   MG 1.8 09/11/2019   MG 1.8 09/09/2019   Lab Results  Component Value Date   VD25OH 71.69 08/28/2020   VD25OH 74.05 09/19/2017    No results found for: "PREALBUMIN"     Latest Ref Rng & Units 06/27/2022    1:45 AM 06/26/2022    4:45 AM 06/25/2022   10:44 PM  CBC EXTENDED  WBC 4.0 - 10.5 K/uL 9.2  9.4  11.5   RBC 4.22 - 5.81 MIL/uL 4.53  4.51  4.69   Hemoglobin 13.0 - 17.0 g/dL 84.6  96.2  95.2   HCT 39.0 - 52.0 % 38.3  39.9  40.4   Platelets 150 - 400 K/uL 149  120  157      There is no height or weight on file to calculate BMI.  Orders:  No orders of the defined types were placed in this encounter.  No orders of the defined types were placed in this encounter.    Procedures: No procedures performed  Clinical Data: No additional findings.  ROS:  All other systems negative, except as noted in the HPI. Review of Systems  Objective: Vital Signs: There were no vitals taken for this visit.  Specialty Comments:  No specialty comments available.  PMFS History: Patient Active Problem List   Diagnosis Date Noted   Pain in left wrist 11/20/2022   S/P CABG (coronary artery bypass graft) 06/27/2022   Hypokalemia 06/27/2022   NSTEMI (non-ST elevated myocardial infarction) (HCC) 06/26/2022   PVC's (premature ventricular contractions) 05/31/2022  Lipoma of right upper extremity 03/02/2021   Trigger little finger of left hand 09/29/2020   Secondary hypercoagulable state (HCC) 02/29/2020   Depression, major, single episode, moderate (HCC) 02/28/2020   Nonrheumatic tricuspid valve regurgitation    S/P mitral valve clip implantation 12/23/2019   Mitral valve disease 12/23/2019   Nonrheumatic mitral valve regurgitation    Persistent atrial fibrillation (HCC)    Chronic HFrEF    Acute idiopathic gout involving toe of left foot    Thrombocytopenia (HCC) 09/08/2019   CAD (coronary artery disease)    Type 2 diabetes mellitus with obesity (HCC) 06/22/2019   Major depressive disorder    Obstructive sleep apnea on CPAP    Hypothyroidism 10/15/2017   Subclavian artery stenosis, right (HCC) 06/15/2017   Severe obesity (BMI 35.0-39.9) with  comorbidity (HCC) 02/26/2017   Essential hypertension 02/26/2017   Memory change 01/10/2017   Urge incontinence of urine 01/10/2017   Lightheaded 06/25/2016   AK (actinic keratosis) 01/03/2016   H/O syncope 03/06/2014   Occlusion and stenosis of left carotid artery 01/11/2014   Routine history and physical examination of adult 11/08/2013   History of repair of hip joint 04/20/2013   Hyperlipidemia 04/20/2013   Presence of artificial hip joint 04/20/2013   Vitamin D deficiency 06/18/2010   Concussion 01/24/2010   Elevated prostate specific antigen (PSA) 01/01/2007   Benign prostatic hyperplasia without urinary obstruction 04/22/2006   Osteoarthrosis 04/22/2006   Dyslipidemia 04/02/2006   Past Medical History:  Diagnosis Date   Arthritis    CAD (coronary artery disease)    Carotid artery disease (HCC)    s/p left CEA 01/17/14   Chronic systolic CHF (congestive heart failure) (HCC)    Concussion Summer 2012   Essential hypertension 02/26/2017   Heart disease    Hyperlipidemia    Hypothyroidism 10/15/2017   Major depressive disorder    Mitral valve regurgitation    Myocardial infarction (HCC) 1996, 2021   Obstructive sleep apnea on CPAP    Persistent atrial fibrillation (HCC)    Poor flexibility of tendon 12/01/2018   S/P mitral valve clip implantation 12/23/2019   s/p TEER with MitraClip with one XTW by Dr. Excell Seltzer   Subungual hematoma of digit of hand 12/01/2018   Subungual hematoma of right foot 12/01/2018   Type 2 diabetes mellitus without complication, without long-term current use of insulin (HCC) 02/26/2017    Family History  Problem Relation Age of Onset   Dementia Father        Concerns surrounding Lewy body dementia, but never officially diagnosed   Cancer Neg Hx     Past Surgical History:  Procedure Laterality Date   ATRIAL FIBRILLATION ABLATION N/A 02/01/2020   Procedure: ATRIAL FIBRILLATION ABLATION;  Surgeon: Regan Lemming, MD;  Location: [facility] INVASIVE  CV LAB;  Service: Cardiovascular;  Laterality: N/A;   BYPASS GRAFT  2001   CARDIAC CATHETERIZATION     CARDIOVERSION N/A 11/08/2019   Procedure: CARDIOVERSION;  Surgeon: Wendall Stade, MD;  Location: [facility] ENDOSCOPY;  Service: Cardiovascular;  Laterality: N/A;   CAROTID ENDARTERECTOMY Left 01/17/2014   CORONARY ANGIOPLASTY     CORONARY ARTERY BYPASS GRAFT     LEFT HEART CATH AND CORS/GRAFTS ANGIOGRAPHY N/A 06/26/2022   Procedure: LEFT HEART CATH AND CORS/GRAFTS ANGIOGRAPHY;  Surgeon: Lennette Bihari, MD;  Location: [facility] INVASIVE CV LAB;  Service: Cardiovascular;  Laterality: N/A;   RIGHT/LEFT HEART CATH AND CORONARY/GRAFT ANGIOGRAPHY N/A 09/09/2019   Procedure: RIGHT/LEFT HEART CATH AND CORONARY/GRAFT ANGIOGRAPHY;  Surgeon: Runell Gess, MD;  Location: [facility] INVASIVE CV LAB;  Service: Cardiovascular;  Laterality: N/A;   TEE WITHOUT CARDIOVERSION N/A 09/13/2019   Procedure: TRANSESOPHAGEAL ECHOCARDIOGRAM (TEE);  Surgeon: Jodelle Red, MD;  Location: [facility] ENDOSCOPY;  Service: Cardiovascular;  Laterality: N/A;   TEE WITHOUT CARDIOVERSION N/A 12/23/2019   Procedure: TRANSESOPHAGEAL ECHOCARDIOGRAM (TEE);  Surgeon: Tonny Bollman, MD;  Location: [facility] INVASIVE CV LAB;  Service: Cardiovascular;  Laterality: N/A;   TEE WITHOUT CARDIOVERSION  02/01/2020   Procedure: TRANSESOPHAGEAL ECHOCARDIOGRAM (TEE);  Surgeon: Regan Lemming, MD;  Location: [facility] INVASIVE CV LAB;  Service: Cardiovascular;;   TOTAL HIP ARTHROPLASTY  1994, 1995   TRANSCATHETER MITRAL EDGE TO EDGE REPAIR N/A 12/23/2019   Procedure: MITRAL VALVE REPAIR;  Surgeon: Tonny Bollman, MD;  Location: [facility] INVASIVE CV LAB;  Service: Cardiovascular;  Laterality: N/A;   Social History   Occupational History   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  Tobacco Use   Smoking status: Former    Current packs/day: 0.00    Types: Cigarettes    Quit date: 1980    Years since quitting: 44.9   Smokeless tobacco: Never  Vaping Use   Vaping status: Never Used   Substance and Sexual Activity   Alcohol use: Yes    Alcohol/week: 4.0 standard drinks of alcohol    Types: 1 Cans of beer, 3 Standard drinks or equivalent per week    Comment: 1 drink 3 times per week   Drug use: No   Sexual activity: Yes

## 2022-12-05 NOTE — Telephone Encounter (Signed)
That's fine. Make sure they are back to back so no other patient can be scheduled in between please. Ty.

## 2022-12-16 ENCOUNTER — Telehealth: Payer: Self-pay | Admitting: Internal Medicine

## 2022-12-16 ENCOUNTER — Other Ambulatory Visit: Payer: Self-pay | Admitting: Urology

## 2022-12-16 NOTE — Telephone Encounter (Signed)
   Pre-operative Risk Assessment    Patient Name: Raymond Christensen  DOB: 1940/07/14 MRN: [id number]      Request for Surgical Clearance    Procedure:  TRANSURETHRAL RESECTION OF THE PROSTATE (TURP)   Date of Surgery:  Clearance 12/27/22                                 Surgeon:  Dr. Jerilee Field  Surgeon's Group or Practice Name:  [facility]  Phone number:  [phone] Ext [phone] Fax number:  [phone]    Type of Clearance Requested:   - Medical  - Pharmacy:  Hold Apixaban (Eliquis) 2 day hold requested    Type of Anesthesia:  General    Additional requests/questions:    Signed, April Henson   12/16/2022, 11:16 AM

## 2022-12-17 ENCOUNTER — Encounter: Payer: Self-pay | Admitting: Family Medicine

## 2022-12-17 ENCOUNTER — Ambulatory Visit: Payer: Medicare (Managed Care) | Admitting: Family Medicine

## 2022-12-17 VITALS — BP 136/70 | HR 73 | Temp 97.9°F | Resp 16 | Ht 68.0 in | Wt 197.8 lb

## 2022-12-17 DIAGNOSIS — E039 Hypothyroidism, unspecified: Secondary | ICD-10-CM | POA: Diagnosis not present

## 2022-12-17 DIAGNOSIS — F411 Generalized anxiety disorder: Secondary | ICD-10-CM

## 2022-12-17 DIAGNOSIS — E669 Obesity, unspecified: Secondary | ICD-10-CM | POA: Diagnosis not present

## 2022-12-17 DIAGNOSIS — E1169 Type 2 diabetes mellitus with other specified complication: Secondary | ICD-10-CM

## 2022-12-17 DIAGNOSIS — Z7985 Long-term (current) use of injectable non-insulin antidiabetic drugs: Secondary | ICD-10-CM

## 2022-12-17 DIAGNOSIS — D1721 Benign lipomatous neoplasm of skin and subcutaneous tissue of right arm: Secondary | ICD-10-CM

## 2022-12-17 DIAGNOSIS — J310 Chronic rhinitis: Secondary | ICD-10-CM

## 2022-12-17 LAB — COMPREHENSIVE METABOLIC PANEL
ALT: 35 U/L (ref 0–53)
AST: 27 U/L (ref 0–37)
Albumin: 4.2 g/dL (ref 3.5–5.2)
Alkaline Phosphatase: 68 U/L (ref 39–117)
BUN: 20 mg/dL (ref 6–23)
CO2: 34 meq/L — ABNORMAL HIGH (ref 19–32)
Calcium: 9.6 mg/dL (ref 8.4–10.5)
Chloride: 106 meq/L (ref 96–112)
Creatinine, Ser: 0.92 mg/dL (ref 0.40–1.50)
GFR: 78.18 mL/min (ref >60.00)
Glucose, Bld: 83 mg/dL (ref 70–99)
Potassium: 5.4 meq/L — ABNORMAL HIGH (ref 3.5–5.1)
Sodium: 143 meq/L (ref 135–145)
Total Bilirubin: 0.7 mg/dL (ref 0.2–1.2)
Total Protein: 6.5 g/dL (ref 6.0–8.3)

## 2022-12-17 LAB — LIPID PANEL
Cholesterol: 113 mg/dL (ref 0–200)
HDL: 39 mg/dL — ABNORMAL LOW (ref >39.00)
LDL Cholesterol: 56 mg/dL (ref 0–99)
NonHDL: 73.72
Total CHOL/HDL Ratio: 3
Triglycerides: 88 mg/dL (ref 0.0–149.0)
VLDL: 17.6 mg/dL (ref 0.0–40.0)

## 2022-12-17 LAB — HEMOGLOBIN A1C: Hgb A1c MFr Bld: 5.7 % (ref 4.6–6.5)

## 2022-12-17 LAB — TSH: TSH: 2.4 u[IU]/mL (ref 0.35–5.50)

## 2022-12-17 LAB — T4, FREE: Free T4: 0.69 ng/dL (ref 0.60–1.60)

## 2022-12-17 MED ORDER — SEMAGLUTIDE (2 MG/DOSE) 8 MG/3ML ~~LOC~~ SOPN: 2.0000 mg | PEN_INJECTOR | SUBCUTANEOUS | 1 refills | Status: DC

## 2022-12-17 MED ORDER — FLUTICASONE PROPIONATE 50 MCG/ACT NA SUSP: 2.0000 | Freq: Every day | NASAL | 6 refills | Status: DC

## 2022-12-17 MED ORDER — PAROXETINE HCL 10 MG PO TABS: 10.0000 mg | ORAL_TABLET | Freq: Every day | ORAL | 1 refills | Status: DC

## 2022-12-17 NOTE — Telephone Encounter (Signed)
   Patient Name: Raymond Christensen  DOB: 1941-01-03 MRN: [id number]  Primary Cardiologist: Christell Constant, MD  Chart reviewed as part of pre-operative protocol coverage for [name]   Called patient 12/17/2022 at 4:15pm. He denies chest pain, shortness of breath, lower extremity edema, fatigue, palpitations, melena, hematuria, hemoptysis, diaphoresis, weakness, presyncope, syncope, orthopnea, and PND.  According to the revised cardiac risk index (RCRI), his perioperative risk of major cardiac event is 10.1%. DASI 8.33METs.   Therefore, based on ACC/AHA guidelines, patient would be at acceptable risk for the planned procedure without further cardiovascular testing.   Regarding ASA therapy, if the surgeon feels that cessation of ASA is required in the perioperative period, it may be stopped 5-7 days prior to surgery with a plan to resume it as soon as felt to be feasible from a surgical standpoint in the post-operative period.   Per office protocol, patient can hold Eliquis for 2 days prior to procedure.   The patient was advised that if he develops new symptoms prior to surgery to contact our office to arrange for a follow-up visit, and he verbalized understanding.  I will route this recommendation to the requesting party via Epic fax function and remove from pre-op pool.  Please call with questions.  Denyce Robert, NP 12/17/2022, 4:17 PM

## 2022-12-17 NOTE — Progress Notes (Signed)
Subjective:   Chief Complaint  Patient presents with   Follow-up    Follow up    Raymond Christensen is a 82 y.o. male here for follow-up of diabetes.   [name] does not routinely monitor his sugars. Patient does not require insulin.   Medications include: Ozempic 2 mg weekly Diet is okay.  Exercise: None No chest pain or shortness of breath  Hypothyroidism Patient presents for follow-up of hypothyroidism.  Reports compliance with medications-levothyroxine 75 mcg daily, Cytomel 10 mcg daily. Current symptoms include: denies fatigue, unexplained weight changes, heat/cold intolerance, bowel/skin changes or CVS symptoms He believes his dose should be not significantly changed  GAD History of anxiety.  He was seeing psychiatry who put him on Zoloft.  He has run out around a week ago and has not noticed any change.  He was taking the 50 mg dosage.  He reported compliance and no adverse effects prior to 1 week ago.  He is particularly grouchy/aggravated.  He is not following with a therapist.  No homicidal or suicidal ideation.  No self-medication.  He has failed Effexor, Celexa, Cymbalta, Abilify, Wellbutrin, BuSpar, Lexapro.  Patient has a history of a mass in his right upper extremity.  He was diagnosed with a lipoma in the past.  His wife thinks it is getting bigger though he has been losing weight steadily over the past 12 years.  There is no pain, drainage, itching, or redness.  Past Medical History:  Diagnosis Date   Arthritis    CAD (coronary artery disease)    Carotid artery disease (HCC)    s/p left CEA 01/17/14   Chronic systolic CHF (congestive heart failure) (HCC)    Concussion Summer 2012   Essential hypertension 02/26/2017   Heart disease    Hyperlipidemia    Hypothyroidism 10/15/2017   Major depressive disorder    Mitral valve regurgitation    Myocardial infarction (HCC) 1996, 2021   Obstructive sleep apnea on CPAP    Persistent atrial fibrillation (HCC)    Poor  flexibility of tendon 12/01/2018   S/P mitral valve clip implantation 12/23/2019   s/p TEER with MitraClip with one XTW by Dr. Excell Seltzer   Subungual hematoma of digit of hand 12/01/2018   Subungual hematoma of right foot 12/01/2018   Type 2 diabetes mellitus without complication, without long-term current use of insulin (HCC) 02/26/2017     Related testing: Retinal exam: Done Pneumovax: done  Objective:  BP 136/70 (BP Location: Left Arm, Patient Position: Sitting, Cuff Size: Normal)   Pulse 73   Temp 97.9 F (36.6 C) (Oral)   Resp 16   Ht 5\' 8"  (1.727 m)   Wt 197 lb 12.8 oz (89.7 kg)   SpO2 98%   BMI 30.08 kg/m  General:  Well developed, well nourished, in no apparent distress Lungs:  CTAB, no access msc use Cardio:  RRR, no bruits, 1+ pitting bilateral LE edema tapering at the mid tibia Skin: Raised lesion over the medial right upper extremity without TTP.  It is rubbery and elliptically shaped in nature.  There is no overlying erythema, fluctuance, drainage, excoriation, or scaling. Psych: Age appropriate judgment and insight  Assessment:   Type 2 diabetes mellitus with obesity (HCC) - Plan: Semaglutide, 2 MG/DOSE, 8 MG/3ML SOPN, Lipid panel, Comprehensive metabolic panel, Hemoglobin A1c  Hypothyroidism, unspecified type - Plan: TSH, T4, free  GAD (generalized anxiety disorder) - Plan: PARoxetine (PAXIL) 10 MG tablet  Rhinitis, unspecified type - Plan: fluticasone (FLONASE) 50 MCG/ACT  nasal spray  Lipoma of right upper extremity - Plan: Ambulatory referral to General Surgery   Plan:   Chronic, probably stable.  Continue Ozempic 2 mg weekly.  He is on a statin.  Counseled on diet and exercise. Chronic, stable.  Continue Cytomel 10 mcg daily, levothyroxine 75 mcg daily. Chronic, unstable.  Stop Zoloft, start Paxil 10 mg daily.  Could consider Vraylar if no improvement.  He has failed many medications as listed above. Refill Flonase. Refer to general surgery F/u in 6  mo. The patient voiced understanding and agreement to the plan.  Jilda Roche Duarte, DO 12/17/22 3:08 PM

## 2022-12-17 NOTE — Telephone Encounter (Signed)
Please advise regarding holding Aspirin for upcoming TURP 12/27/22 due to restenosis of SVG graft on cath 06/2022.

## 2022-12-17 NOTE — Patient Instructions (Signed)
Give us 2-3 business days to get the results of your labs back.   Keep the diet clean and stay active.  If you do not hear anything about your referral in the next 1-2 weeks, call our office and ask for an update.  Let us know if you need anything. 

## 2022-12-17 NOTE — Telephone Encounter (Signed)
Patient with diagnosis of A Fib on Eliquis for anticoagulation.    Procedure: TRANSURETHRAL RESECTION OF THE PROSTATE (TURP)  Date of procedure: 12/27/22   CHA2DS2-VASc Score = 6  This indicates a 9.7% annual risk of stroke. The patient's score is based upon: CHF History: 1 HTN History: 1 Diabetes History: 1 Stroke History: 0 Vascular Disease History: 1 Age Score: 2 Gender Score: 0   CrCl 93 mL/min Platelet count 149K   Per office protocol, patient can hold Eliquis for 2 days prior to procedure. Surgeon requests 2 day hold.   **This guidance is not considered finalized until pre-operative APP has relayed final recommendations.**

## 2022-12-18 ENCOUNTER — Ambulatory Visit (INDEPENDENT_AMBULATORY_CARE_PROVIDER_SITE_OTHER): Payer: Self-pay

## 2022-12-18 ENCOUNTER — Other Ambulatory Visit (INDEPENDENT_AMBULATORY_CARE_PROVIDER_SITE_OTHER): Payer: Medicare (Managed Care)

## 2022-12-18 ENCOUNTER — Other Ambulatory Visit: Payer: Self-pay

## 2022-12-18 ENCOUNTER — Encounter: Payer: Self-pay | Admitting: Physician Assistant

## 2022-12-18 ENCOUNTER — Ambulatory Visit: Payer: Medicare (Managed Care) | Admitting: Physician Assistant

## 2022-12-18 DIAGNOSIS — M25532 Pain in left wrist: Secondary | ICD-10-CM

## 2022-12-18 DIAGNOSIS — E875 Hyperkalemia: Secondary | ICD-10-CM

## 2022-12-18 DIAGNOSIS — E669 Obesity, unspecified: Secondary | ICD-10-CM

## 2022-12-18 NOTE — Progress Notes (Signed)
Office Visit Note   Patient: Raymond Christensen           Date of Birth: 01/06/1941           MRN: [id number] Visit Date: 12/18/2022              Requested by: Sharlene Dory, DO [address] [place],  [place] [address] PCP: Sharlene Dory, DO  Left wrist pain    HPI: Patient is a pleasant 82 year old gentleman who is now almost a month status post fall onto outstretched hand.  He is right-hand dominant but does have some ambidextrous things he does.  He was diagnosed with a nondisplaced left atrium fracture.  He has been in a removable splint full-time.  He complains of no pain today.  Assessment & Plan: Visit Diagnoses: Left wrist pain  Plan: He may discontinue the splint except for at risk activities.  His range of motion is actually quite good and he has at no pain to tender this to palpation.  May follow-up as needed  Follow-Up Instructions: No follow-ups on file.   Ortho Exam  Patient is alert, oriented, no adenopathy, well-dressed, normal affect, normal respiratory effort. Left wrist he is neurovascularly intact he has good opposition he has good extension and flexion no tenderness at all and even to deep palpation no swelling strong radial pulse brisk capillary refill  Imaging: No results found. No images are attached to the encounter.  Labs: Lab Results  Component Value Date   HGBA1C 5.7 12/17/2022   HGBA1C 5.4 06/26/2022   HGBA1C 5.8 (H) 05/03/2022   LABURIC 4.7 02/11/2022   LABURIC 6.5 03/03/2020   LABURIC 6.3 09/11/2019     Lab Results  Component Value Date   ALBUMIN 4.2 12/17/2022   ALBUMIN 4.1 09/30/2021   ALBUMIN 4.3 08/31/2021    Lab Results  Component Value Date   MG 1.8 09/11/2019   MG 1.8 09/09/2019   Lab Results  Component Value Date   VD25OH 71.69 08/28/2020   VD25OH 74.05 09/19/2017    No results found for: "PREALBUMIN"    Latest Ref Rng & Units 06/27/2022    1:45 AM 06/26/2022    4:45 AM 06/25/2022    10:44 PM  CBC EXTENDED  WBC 4.0 - 10.5 K/uL 9.2  9.4  11.5   RBC 4.22 - 5.81 MIL/uL 4.53  4.51  4.69   Hemoglobin 13.0 - 17.0 g/dL 21.3  08.6  57.8   HCT 39.0 - 52.0 % 38.3  39.9  40.4   Platelets 150 - 400 K/uL 149  120  157      There is no height or weight on file to calculate BMI.  Orders:  No orders of the defined types were placed in this encounter.  No orders of the defined types were placed in this encounter.    Procedures: No procedures performed  Clinical Data: No additional findings.  ROS:  All other systems negative, except as noted in the HPI. Review of Systems  Objective: Vital Signs: There were no vitals taken for this visit.  Specialty Comments:  No specialty comments available.  PMFS History: Patient Active Problem List   Diagnosis Date Noted   Pain in left wrist 11/20/2022   S/P CABG (coronary artery bypass graft) 06/27/2022   Hypokalemia 06/27/2022   NSTEMI (non-ST elevated myocardial infarction) (HCC) 06/26/2022   PVC's (premature ventricular contractions) 05/31/2022   Lipoma of right upper extremity 03/02/2021   Trigger  little finger of left hand 09/29/2020   Secondary hypercoagulable state (HCC) 02/29/2020   Depression, major, single episode, moderate (HCC) 02/28/2020   Nonrheumatic tricuspid valve regurgitation    S/P mitral valve clip implantation 12/23/2019   Mitral valve disease 12/23/2019   Nonrheumatic mitral valve regurgitation    Persistent atrial fibrillation (HCC)    Chronic HFrEF    Acute idiopathic gout involving toe of left foot    Thrombocytopenia (HCC) 09/08/2019   CAD (coronary artery disease)    Type 2 diabetes mellitus with obesity (HCC) 06/22/2019   Major depressive disorder    Obstructive sleep apnea on CPAP    Hypothyroidism 10/15/2017   Subclavian artery stenosis, right (HCC) 06/15/2017   Severe obesity (BMI 35.0-39.9) with comorbidity (HCC) 02/26/2017   Essential hypertension 02/26/2017   Memory change  01/10/2017   Urge incontinence of urine 01/10/2017   Lightheaded 06/25/2016   AK (actinic keratosis) 01/03/2016   H/O syncope 03/06/2014   Occlusion and stenosis of left carotid artery 01/11/2014   Routine history and physical examination of adult 11/08/2013   History of repair of hip joint 04/20/2013   Hyperlipidemia 04/20/2013   Presence of artificial hip joint 04/20/2013   Vitamin D deficiency 06/18/2010   Concussion 01/24/2010   Elevated prostate specific antigen (PSA) 01/01/2007   Benign prostatic hyperplasia without urinary obstruction 04/22/2006   Osteoarthrosis 04/22/2006   Dyslipidemia 04/02/2006   Past Medical History:  Diagnosis Date   Arthritis    CAD (coronary artery disease)    Carotid artery disease (HCC)    s/p left CEA 01/17/14   Chronic systolic CHF (congestive heart failure) (HCC)    Concussion Summer 2012   Essential hypertension 02/26/2017   Heart disease    Hyperlipidemia    Hypothyroidism 10/15/2017   Major depressive disorder    Mitral valve regurgitation    Myocardial infarction (HCC) 1996, 2021   Obstructive sleep apnea on CPAP    Persistent atrial fibrillation (HCC)    Poor flexibility of tendon 12/01/2018   S/P mitral valve clip implantation 12/23/2019   s/p TEER with MitraClip with one XTW by Dr. Excell Seltzer   Subungual hematoma of digit of hand 12/01/2018   Subungual hematoma of right foot 12/01/2018   Type 2 diabetes mellitus without complication, without long-term current use of insulin (HCC) 02/26/2017    Family History  Problem Relation Age of Onset   Dementia Father        Concerns surrounding Lewy body dementia, but never officially diagnosed   Cancer Neg Hx     Past Surgical History:  Procedure Laterality Date   ATRIAL FIBRILLATION ABLATION N/A 02/01/2020   Procedure: ATRIAL FIBRILLATION ABLATION;  Surgeon: Regan Lemming, MD;  Location: [facility] INVASIVE CV LAB;  Service: Cardiovascular;  Laterality: N/A;   BYPASS GRAFT  2001   CARDIAC  CATHETERIZATION     CARDIOVERSION N/A 11/08/2019   Procedure: CARDIOVERSION;  Surgeon: Wendall Stade, MD;  Location: [facility] ENDOSCOPY;  Service: Cardiovascular;  Laterality: N/A;   CAROTID ENDARTERECTOMY Left 01/17/2014   CORONARY ANGIOPLASTY     CORONARY ARTERY BYPASS GRAFT     LEFT HEART CATH AND CORS/GRAFTS ANGIOGRAPHY N/A 06/26/2022   Procedure: LEFT HEART CATH AND CORS/GRAFTS ANGIOGRAPHY;  Surgeon: Lennette Bihari, MD;  Location: [facility] INVASIVE CV LAB;  Service: Cardiovascular;  Laterality: N/A;   RIGHT/LEFT HEART CATH AND CORONARY/GRAFT ANGIOGRAPHY N/A 09/09/2019   Procedure: RIGHT/LEFT HEART CATH AND CORONARY/GRAFT ANGIOGRAPHY;  Surgeon: Runell Gess, MD;  Location: [facility]  INVASIVE CV LAB;  Service: Cardiovascular;  Laterality: N/A;   TEE WITHOUT CARDIOVERSION N/A 09/13/2019   Procedure: TRANSESOPHAGEAL ECHOCARDIOGRAM (TEE);  Surgeon: Jodelle Red, MD;  Location: [facility] ENDOSCOPY;  Service: Cardiovascular;  Laterality: N/A;   TEE WITHOUT CARDIOVERSION N/A 12/23/2019   Procedure: TRANSESOPHAGEAL ECHOCARDIOGRAM (TEE);  Surgeon: Tonny Bollman, MD;  Location: [facility] INVASIVE CV LAB;  Service: Cardiovascular;  Laterality: N/A;   TEE WITHOUT CARDIOVERSION  02/01/2020   Procedure: TRANSESOPHAGEAL ECHOCARDIOGRAM (TEE);  Surgeon: Regan Lemming, MD;  Location: [facility] INVASIVE CV LAB;  Service: Cardiovascular;;   TOTAL HIP ARTHROPLASTY  1994, 1995   TRANSCATHETER MITRAL EDGE TO EDGE REPAIR N/A 12/23/2019   Procedure: MITRAL VALVE REPAIR;  Surgeon: Tonny Bollman, MD;  Location: [facility] INVASIVE CV LAB;  Service: Cardiovascular;  Laterality: N/A;   Social History   Occupational History   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  Tobacco Use   Smoking status: Former    Current packs/day: 0.00    Types: Cigarettes    Quit date: 1980    Years since quitting: 44.9   Smokeless tobacco: Never  Vaping Use   Vaping status: Never Used  Substance and Sexual Activity   Alcohol use: Yes    Alcohol/week: 4.0 standard drinks of  alcohol    Types: 1 Cans of beer, 3 Standard drinks or equivalent per week    Comment: 1 drink 3 times per week   Drug use: No   Sexual activity: Yes

## 2022-12-18 NOTE — Addendum Note (Signed)
Addended by: Kathi Ludwig on: 12/18/2022 09:00 AM   Modules accepted: Orders

## 2022-12-19 ENCOUNTER — Encounter (HOSPITAL_COMMUNITY): Payer: Self-pay

## 2022-12-19 ENCOUNTER — Other Ambulatory Visit: Payer: Self-pay

## 2022-12-19 ENCOUNTER — Encounter (HOSPITAL_COMMUNITY)
Admission: RE | Admit: 2022-12-19 | Discharge: 2022-12-19 | Disposition: A | Discharge status: Home / Self Care | Payer: Medicare (Managed Care) | Source: Ambulatory Visit | Attending: Urology | Admitting: Urology

## 2022-12-19 VITALS — BP 142/90 | HR 72 | Temp 98.4°F | Ht 68.0 in | Wt 195.0 lb

## 2022-12-19 DIAGNOSIS — N400 Enlarged prostate without lower urinary tract symptoms: Secondary | ICD-10-CM | POA: Insufficient documentation

## 2022-12-19 DIAGNOSIS — E1169 Type 2 diabetes mellitus with other specified complication: Secondary | ICD-10-CM | POA: Diagnosis not present

## 2022-12-19 DIAGNOSIS — Z01812 Encounter for preprocedural laboratory examination: Secondary | ICD-10-CM | POA: Insufficient documentation

## 2022-12-19 DIAGNOSIS — I110 Hypertensive heart disease with heart failure: Secondary | ICD-10-CM | POA: Diagnosis not present

## 2022-12-19 DIAGNOSIS — E669 Obesity, unspecified: Secondary | ICD-10-CM | POA: Diagnosis not present

## 2022-12-19 DIAGNOSIS — I2510 Atherosclerotic heart disease of native coronary artery without angina pectoris: Secondary | ICD-10-CM | POA: Diagnosis not present

## 2022-12-19 DIAGNOSIS — Z951 Presence of aortocoronary bypass graft: Secondary | ICD-10-CM | POA: Insufficient documentation

## 2022-12-19 DIAGNOSIS — I509 Heart failure, unspecified: Secondary | ICD-10-CM | POA: Diagnosis not present

## 2022-12-19 DIAGNOSIS — I10 Essential (primary) hypertension: Secondary | ICD-10-CM

## 2022-12-19 HISTORY — DX: Malignant (primary) neoplasm, unspecified: C80.1

## 2022-12-19 LAB — GLUCOSE, CAPILLARY: Glucose-Capillary: 76 mg/dL (ref 70–99)

## 2022-12-19 LAB — CBC
HCT: 42.8 % (ref 39.0–52.0)
Hemoglobin: 13.3 g/dL (ref 13.0–17.0)
MCH: 28.4 pg (ref 26.0–34.0)
MCHC: 31.1 g/dL (ref 30.0–36.0)
MCV: 91.3 fL (ref 80.0–100.0)
Platelets: 153 10*3/uL (ref 150–400)
RBC: 4.69 MIL/uL (ref 4.22–5.81)
RDW: 15 % (ref 11.5–15.5)
WBC: 9.2 10*3/uL (ref 4.0–10.5)
nRBC: 0 % (ref 0.0–0.2)

## 2022-12-19 LAB — BASIC METABOLIC PANEL
BUN: 21 mg/dL (ref 6–23)
CO2: 29 meq/L (ref 19–32)
Calcium: 9.4 mg/dL (ref 8.4–10.5)
Chloride: 106 meq/L (ref 96–112)
Creatinine, Ser: 0.9 mg/dL (ref 0.40–1.50)
GFR: 80.26 mL/min (ref >60.00)
Glucose, Bld: 83 mg/dL (ref 70–99)
Potassium: 4.3 meq/L (ref 3.5–5.1)
Sodium: 143 meq/L (ref 135–145)

## 2022-12-19 MED ORDER — TRANEXAMIC ACID-NACL 1000-0.7 MG/100ML-% IV SOLN: 1000.0000 mg | INTRAVENOUS | Status: DC

## 2022-12-19 NOTE — Progress Notes (Signed)
For Anesthesia: PCP - Sharlene Dory, DO Cardiologist - Christell Constant, MD  Clearance: Raymond Robert, NP : 12/17/22 Bowel Prep reminder:  Chest x-ray -  EKG - 07/30/22 Stress Test -  ECHO - 09/23/22 Cardiac Cath - 06/26/22 Pacemaker/ICD device last checked: Pacemaker orders received: Device Rep notified:  Spinal Cord Stimulator:  Sleep Study - Yes CPAP - NO  Fasting Blood Sugar - N/A Checks Blood Sugar ___0__ times a day Date and result of last Hgb A1c-5.7: 12/17/22  Last dose of GLP1 agonist- semaglutide GLP1 instructions: To hold it after: 12/15/22  Last dose of SGLT-2 inhibitors-  SGLT-2 instructions:   Blood Thinner Instructions: Eliquis to hold after: 12/24/22 Aspirin Instructions: To hold after: 12/21/22 Last Dose:  Activity level: Can go up a flight of stairs and activities of daily living without stopping and without chest pain and/or shortness of breath   Able to exercise without chest pain and/or shortness of breath  Anesthesia review: Hx: HTN,Afib,DIA,CAD,CHF,OSA(NO CPAP)  Patient denies shortness of breath, fever, cough and chest pain at PAT appointment   Patient verbalized understanding of instructions that were given to them at the PAT appointment. Patient was also instructed that they will need to review over the PAT instructions again at home before surgery.

## 2022-12-19 NOTE — Patient Instructions (Signed)
DUE TO COVID-19 ONLY TWO VISITORS  (aged 81 and older)  ARE ALLOWED TO COME WITH YOU AND STAY IN THE WAITING ROOM ONLY DURING PRE OP AND PROCEDURE.   **NO VISITORS ARE ALLOWED IN THE SHORT STAY AREA OR RECOVERY ROOM!!**  IF YOU WILL BE ADMITTED INTO THE HOSPITAL YOU ARE ALLOWED ONLY FOUR SUPPORT PEOPLE DURING VISITATION HOURS ONLY (7 AM -8PM)   The support person(s) must pass our screening, gel in and out, and wear a mask at all times, including in the patient's room. Patients must also wear a mask when staff or their support person are in the room. Visitors GUEST BADGE MUST BE WORN VISIBLY  One adult visitor may remain with you overnight and MUST be in the room by 8 P.M.     Your procedure is scheduled on: 01/26/22   Report to [facility] Main Entrance    Report to admitting at : 8:30 AM   Call this number if you have problems the morning of surgery [phone]   Do not eat food or drink fluids :After Midnight.  FOLLOW ANY ADDITIONAL PRE OP INSTRUCTIONS YOU RECEIVED FROM YOUR SURGEON'S OFFICE!!!   Oral Hygiene is also important to reduce your risk of infection.                                    Remember - BRUSH YOUR TEETH THE MORNING OF SURGERY WITH YOUR REGULAR TOOTHPASTE  DENTURES WILL BE REMOVED PRIOR TO SURGERY PLEASE DO NOT APPLY "Poly grip" OR ADHESIVES!!!   Do NOT smoke after Midnight   Take these medicines the morning of surgery with A SIP OF WATER: paroxetine,metoprolol,finasteride,levothyroxine,liothyronine,famotidine.Tylenol as neede.  How to Manage Your Diabetes Before and After Surgery  Why is it important to control my blood sugar before and after surgery? Improving blood sugar levels before and after surgery helps healing and can limit problems. A way of improving blood sugar control is eating a healthy diet by:  Eating less sugar and carbohydrates  Increasing activity/exercise  Talking with your doctor about reaching your blood sugar goals High blood  sugars (greater than 180 mg/dL) can raise your risk of infections and slow your recovery, so you will need to focus on controlling your diabetes during the weeks before surgery. Make sure that the doctor who takes care of your diabetes knows about your planned surgery including the date and location.  How do I manage my blood sugar before surgery? Check your blood sugar at least 4 times a day, starting 2 days before surgery, to make sure that the level is not too high or low. Check your blood sugar the morning of your surgery when you wake up and every 2 hours until you get to the Short Stay unit. If your blood sugar is less than 70 mg/dL, you will need to treat for low blood sugar: Do not take insulin. Treat a low blood sugar (less than 70 mg/dL) with  cup of clear juice (cranberry or apple), 4 glucose tablets, OR glucose gel. Recheck blood sugar in 15 minutes after treatment (to make sure it is greater than 70 mg/dL). If your blood sugar is not greater than 70 mg/dL on recheck, call [phone] for further instructions. Report your blood sugar to the short stay nurse when you get to Short Stay.  If you are admitted to the hospital after surgery: Your blood sugar will be checked by  the staff and you will probably be given insulin after surgery (instead of oral diabetes medicines) to make sure you have good blood sugar levels. The goal for blood sugar control after surgery is 80-180 mg/dL.   WHAT DO I DO ABOUT MY DIABETES MEDICATION?  DO NOT TAKE THE FOLLOWING 7 DAYS PRIOR TO SURGERY: Ozempic, Wegovy, Rybelsus (Semaglutide), Byetta (exenatide), Bydureon (exenatide ER), Victoza, Saxenda (liraglutide), or Trulicity (dulaglutide) Mounjaro (Tirzepatide) Adlyxin (Lixisenatide), Polyethylene Glycol Loxenatide. HOLD ozempic after 12/15/22                              You may not have any metal on your body including hair pins, jewelry, and body piercing             Do not wear lotions, powders,  perfumes/cologne, or deodorant              Men may shave face and neck.   Do not bring valuables to the hospital. [place] IS NOT             RESPONSIBLE   FOR VALUABLES.   Contacts, glasses, or bridgework may not be worn into surgery.   Bring small overnight bag day of surgery.   DO NOT BRING YOUR HOME MEDICATIONS TO THE HOSPITAL. PHARMACY WILL DISPENSE MEDICATIONS LISTED ON YOUR MEDICATION LIST TO YOU DURING YOUR ADMISSION IN THE HOSPITAL!    Patients discharged on the day of surgery will not be allowed to drive home.  Someone NEEDS to stay with you for the first 24 hours after anesthesia.   Special Instructions: Bring a copy of your healthcare power of attorney and living will documents         the day of surgery if you haven't scanned them before.              Please read over the following fact sheets you were given: IF YOU HAVE QUESTIONS ABOUT YOUR PRE-OP INSTRUCTIONS PLEASE CALL [phone]    These are anesthesia recommendations for holding your anticoagulants.  Please contact your prescribing physician to confirm IF it is safe to hold your anticoagulants for this length of time.  HOLD aspirin after: 12/21/22.  Eliquis after: 12/24/22  Eliquis Apixaban   72 hours   Xarelto Rivaroxaban   72 hours  Plavix Clopidogrel   120 hours  Pletal Cilostazol   120 hours  [place] - Preparing for Surgery Before surgery, you can play an important role.  Because skin is not sterile, your skin needs to be as free of germs as possible.  You can reduce the number of germs on your skin by washing with CHG (chlorahexidine gluconate) soap before surgery.  CHG is an antiseptic cleaner which kills germs and bonds with the skin to continue killing germs even after washing. Please DO NOT use if you have an allergy to CHG or antibacterial soaps.  If your skin becomes reddened/irritated stop using the CHG and inform your nurse when you arrive at Short Stay. Do not shave (including legs and  underarms) for at least 48 hours prior to the first CHG shower.  You may shave your face/neck. Please follow these instructions carefully:  1.  Shower with CHG Soap the night before surgery and the  morning of Surgery.  2.  If you choose to wash your hair, wash your hair first as usual with your  normal  shampoo.  3.  After you shampoo, rinse  your hair and body thoroughly to remove the  shampoo.                           4.  Use CHG as you would any other liquid soap.  You can apply chg directly  to the skin and wash                       Gently with a scrungie or clean washcloth.  5.  Apply the CHG Soap to your body ONLY FROM THE NECK DOWN.   Do not use on face/ open                           Wound or open sores. Avoid contact with eyes, ears mouth and genitals (private parts).                       Wash face,  Genitals (private parts) with your normal soap.             6.  Wash thoroughly, paying special attention to the area where your surgery  will be performed.  7.  Thoroughly rinse your body with warm water from the neck down.  8.  DO NOT shower/wash with your normal soap after using and rinsing off  the CHG Soap.                9.  Pat yourself dry with a clean towel.            10.  Wear clean pajamas.            11.  Place clean sheets on your bed the night of your first shower and do not  sleep with pets. Day of Surgery : Do not apply any lotions/deodorants the morning of surgery.  Please wear clean clothes to the hospital/surgery center.  FAILURE TO FOLLOW THESE INSTRUCTIONS MAY RESULT IN THE CANCELLATION OF YOUR SURGERY PATIENT SIGNATURE_________________________________  NURSE SIGNATURE__________________________________  ________________________________________________________________________

## 2022-12-20 NOTE — Anesthesia Preprocedure Evaluation (Addendum)
Anesthesia Evaluation  Patient identified by MRN, date of birth, ID band Patient awake    Reviewed: Allergy & Precautions, NPO status , Patient's Chart, lab work & pertinent test results  Airway Mallampati: II  TM Distance: >3 FB Neck ROM: Full    Dental  (+) Dental Advisory Given, Teeth Intact   Pulmonary sleep apnea , former smoker   Pulmonary exam normal breath sounds clear to auscultation       Cardiovascular hypertension, Pt. on medications + CAD, + Past MI, + Cardiac Stents, + Peripheral Vascular Disease and +CHF  + dysrhythmias Atrial Fibrillation + Valvular Problems/Murmurs MR and AI  Rhythm:Irregular Rate:Normal + Systolic murmurs Echo 09/2022  1. Apical window is foreshortened, making accurate assessment of LVEF difficult. Left ventricular ejection fraction, by estimation, is 45 to 50%. The left ventricle has mildly decreased function. The left ventricle demonstrates global hypokinesis. There is mild concentric left ventricular hypertrophy. Left ventricular diastolic parameters are consistent with Grade II diastolic dysfunction (pseudonormalization). Elevated left atrial pressure.   2. Right ventricular systolic function is mildly reduced. The right ventricular size is mildly enlarged.   3. Left atrial size was severely dilated.   4. Right atrial size was moderately dilated.   5. S/p MtriaClip (one XTW; procedure date 12/23/19). MR is very eccentric, directed posterior into LA Appears moderate. More prominent when compared to echo from 2022.. The mitral valve has been repaired/replaced. Moderate mitral valve regurgitation.   6. The aortic valve is tricuspid. Aortic valve regurgitation is mild.    LHC 06/2022 Severe multivessel CAD with 70% eccentric distal left main stenosis; 40% stenosis in the first diagonal vessel, 75 to 80% stenosis in the LAD between the first and second diagonal vessel with diffuse 70% stenosis in the second  diagonal vessel.  There is competitive filling of the mid LAD due to LIMA graft flow.   Diffuse irregularity of the left circumflex vessel with 20% proximal stenosis, 70% ostial stenosis in the first marginal branch, small second marginal branch with mid occlusion, and 50% mid AV groove circumflex stenosis.   Previously noted high-grade mid RCA stenosis with total occlusion before the acute margin.  There are faint collaterals supplying the very distal RCA via the left coronary injection.   Patent LIMA graft supplying the mid LAD.   Severely diseased questional SVG with new 95% proximal graft stenosis, extensive thrombus with 85% stenosis in the stent in the region of the OM 2 vessel, and total occlusion of the more distal stent in the vein graft with complete occlusion of the distal graft prior to anastomosing into the distal RCA.   Moderate LV dysfunction with EF estimate approximately 35 to 45%.  LVEDP 17 mm.    Neuro/Psych  PSYCHIATRIC DISORDERS  Depression    negative neurological ROS     GI/Hepatic negative GI ROS, Neg liver ROS,,,  Endo/Other  diabetesHypothyroidism    Renal/GU negative Renal ROS     Musculoskeletal  (+) Arthritis ,    Abdominal   Peds  Hematology negative hematology ROS (+)   Anesthesia Other Findings   Reproductive/Obstetrics                             Anesthesia Physical Anesthesia Plan  ASA: 3  Anesthesia Plan: General   Post-op Pain Management: Tylenol PO (pre-op)*   Induction: Intravenous  PONV Risk Score and Plan: 3 and Ondansetron, Dexamethasone and Treatment may vary due to age  or medical condition  Airway Management Planned: Oral ETT and LMA  Additional Equipment:   Intra-op Plan:   Post-operative Plan: Extubation in OR  Informed Consent: I have reviewed the patients History and Physical, chart, labs and discussed the procedure including the risks, benefits and alternatives for the proposed anesthesia  with the patient or authorized representative who has indicated his/her understanding and acceptance.     Dental advisory given  Plan Discussed with: CRNA  Anesthesia Plan Comments: (See PAT note 12/19/2022,  Last dose of eliquis 12/11 in am.  Risks of anesthesia explained at length. This includes, but is not limited to, sore throat, damage to teeth, lips gums, tongue and vocal cords, nausea and vomiting, reactions to medications, stroke, heart attack, and death. All patient questions were answered and the patient wishes to proceed. )       Anesthesia Quick Evaluation

## 2022-12-20 NOTE — Progress Notes (Signed)
Anesthesia Chart Review   Case: [id number] Date/Time: 12/27/22 1030   Procedure: TRANSURETHRAL RESECTION OF THE PROSTATE (TURP) - 75 minute case   Anesthesia type: General   Pre-op diagnosis: Benign prostatic hyperplasia   Location: [facility] ROOM 07 / [facility]   Surgeons: Jerilee Field, MD       DISCUSSION:82 y.o. former smoker with h/o sleep apnea, HTN, DM II, CAD s/p CABG, CHF, carotid artery disease s/p L CEA, atrial fibrillation, s/p mitral valve clip, BPH scheduled for above procedure 12/27/2022 with Dr. Jerilee Field.   NSTEMI 06/27/2022, medical therapy recommended.   Per cardiology preoperative evaluation 12/17/2022, "Called patient 12/17/2022 at 4:15pm. He denies chest pain, shortness of breath, lower extremity edema, fatigue, palpitations, melena, hematuria, hemoptysis, diaphoresis, weakness, presyncope, syncope, orthopnea, and PND.   According to the revised cardiac risk index (RCRI), his perioperative risk of major cardiac event is 10.1%. DASI 8.33METs.    Therefore, based on ACC/AHA guidelines, patient would be at acceptable risk for the planned procedure without further cardiovascular testing.    Regarding ASA therapy, if the surgeon feels that cessation of ASA is required in the perioperative period, it may be stopped 5-7 days prior to surgery with a plan to resume it as soon as felt to be feasible from a surgical standpoint in the post-operative period.    Per office protocol, patient can hold Eliquis for 2 days prior to procedure."   VS: BP (!) 142/90   Pulse 72   Temp 36.9 C (Oral)   Ht 5\' 8"  (1.727 m)   Wt 88.5 kg   SpO2 100%   BMI 29.65 kg/m   PROVIDERS: Sharlene Dory, DO is PCP   Cardiologist - Christell Constant, MD  LABS: Labs reviewed: Acceptable for surgery. (all labs ordered are listed, but only abnormal results are displayed)  Labs Reviewed  GLUCOSE, CAPILLARY  CBC     IMAGES:   EKG:   CV: Echo 09/23/2022 1. Apical window  is foreshortened, making accurate assessment of LVEF  difficult . Left ventricular ejection fraction, by estimation, is 45 to  50%. The left ventricle has mildly decreased function. The left ventricle  demonstrates global hypokinesis.  There is mild concentric left ventricular hypertrophy. Left ventricular  diastolic parameters are consistent with Grade II diastolic dysfunction  (pseudonormalization). Elevated left atrial pressure.   2. Right ventricular systolic function is mildly reduced. The right  ventricular size is mildly enlarged.   3. Left atrial size was severely dilated.   4. Right atrial size was moderately dilated.   5. S/p MtriaClip (one XTW; procedure date 12/23/19). MR is very eccentric,  directed posterior into LA Appears moderate. More prominent when compared  to echo from 2022.. The mitral valve has been repaired/replaced. Moderate  mitral valve regurgitation.   6. The aortic valve is tricuspid. Aortic valve regurgitation is mild.  Past Medical History:  Diagnosis Date   Arthritis    CAD (coronary artery disease)    Cancer (HCC)    skin lt. arm.   Carotid artery disease (HCC)    s/p left CEA 01/17/14   Chronic systolic CHF (congestive heart failure) [facility])    Concussion Summer 2012   Essential hypertension 02/26/2017   Heart disease    Hyperlipidemia    Hypothyroidism 10/15/2017   Major depressive disorder    Mitral valve regurgitation    Myocardial infarction (HCC) 1996, 2021   Obstructive sleep apnea on CPAP    Persistent atrial fibrillation (HCC)  Poor flexibility of tendon 12/01/2018   S/P mitral valve clip implantation 12/23/2019   s/p TEER with MitraClip with one XTW by Dr. Excell Seltzer   Subungual hematoma of digit of hand 12/01/2018   Subungual hematoma of right foot 12/01/2018   Type 2 diabetes mellitus without complication, without long-term current use of insulin (HCC) 02/26/2017    Past Surgical History:  Procedure Laterality Date   ATRIAL  FIBRILLATION ABLATION N/A 02/01/2020   Procedure: ATRIAL FIBRILLATION ABLATION;  Surgeon: Regan Lemming, MD;  Location: [facility] INVASIVE CV LAB;  Service: Cardiovascular;  Laterality: N/A;   BYPASS GRAFT  2001   CARDIAC CATHETERIZATION     CARDIOVERSION N/A 11/08/2019   Procedure: CARDIOVERSION;  Surgeon: Wendall Stade, MD;  Location: [facility] ENDOSCOPY;  Service: Cardiovascular;  Laterality: N/A;   CAROTID ENDARTERECTOMY Left 01/17/2014   CORONARY ANGIOPLASTY     CORONARY ARTERY BYPASS GRAFT     LEFT HEART CATH AND CORS/GRAFTS ANGIOGRAPHY N/A 06/26/2022   Procedure: LEFT HEART CATH AND CORS/GRAFTS ANGIOGRAPHY;  Surgeon: Lennette Bihari, MD;  Location: [facility] INVASIVE CV LAB;  Service: Cardiovascular;  Laterality: N/A;   RIGHT/LEFT HEART CATH AND CORONARY/GRAFT ANGIOGRAPHY N/A 09/09/2019   Procedure: RIGHT/LEFT HEART CATH AND CORONARY/GRAFT ANGIOGRAPHY;  Surgeon: Runell Gess, MD;  Location: [facility] INVASIVE CV LAB;  Service: Cardiovascular;  Laterality: N/A;   TEE WITHOUT CARDIOVERSION N/A 09/13/2019   Procedure: TRANSESOPHAGEAL ECHOCARDIOGRAM (TEE);  Surgeon: Jodelle Red, MD;  Location: [facility] ENDOSCOPY;  Service: Cardiovascular;  Laterality: N/A;   TEE WITHOUT CARDIOVERSION N/A 12/23/2019   Procedure: TRANSESOPHAGEAL ECHOCARDIOGRAM (TEE);  Surgeon: Tonny Bollman, MD;  Location: [facility] INVASIVE CV LAB;  Service: Cardiovascular;  Laterality: N/A;   TEE WITHOUT CARDIOVERSION  02/01/2020   Procedure: TRANSESOPHAGEAL ECHOCARDIOGRAM (TEE);  Surgeon: Regan Lemming, MD;  Location: [facility] INVASIVE CV LAB;  Service: Cardiovascular;;   TOTAL HIP ARTHROPLASTY  1994, 1995   TOTAL HIP ARTHROPLASTY Bilateral    TRANSCATHETER MITRAL EDGE TO EDGE REPAIR N/A 12/23/2019   Procedure: MITRAL VALVE REPAIR;  Surgeon: Tonny Bollman, MD;  Location: [facility] INVASIVE CV LAB;  Service: Cardiovascular;  Laterality: N/A;    MEDICATIONS:  acetaminophen (TYLENOL) 500 MG tablet   apixaban (ELIQUIS) 5 MG TABS tablet    aspirin EC (ASPIR-LOW) 81 MG tablet   atorvastatin (LIPITOR) 80 MG tablet   Cholecalciferol (VITAMIN D3) 250 MCG (10000 UT) capsule   ezetimibe (ZETIA) 10 MG tablet   famotidine (PEPCID) 20 MG tablet   finasteride (PROSCAR) 5 MG tablet   fluticasone (FLONASE) 50 MCG/ACT nasal spray   levothyroxine (SYNTHROID) 75 MCG tablet   liothyronine (CYTOMEL) 5 MCG tablet   metoprolol succinate (TOPROL-XL) 25 MG 24 hr tablet   Multiple Vitamins-Minerals (MULTIVITAMIN ADULT EXTRA C PO)   nitroGLYCERIN (NITROSTAT) 0.4 MG SL tablet   PARoxetine (PAXIL) 10 MG tablet   Semaglutide, 2 MG/DOSE, 8 MG/3ML SOPN   No current facility-administered medications for this encounter.    Jodell Cipro Ward, PA-C [facility]-Surgical Testing [phone]

## 2022-12-26 NOTE — H&P (Signed)
Office Visit Report     12/11/2022   --------------------------------------------------------------------------------   Raymond Christensen  MRN: [id number]  DOB: Jul 31, 1940, 82 year old Male  SSN:    PRIMARY CARE:  Arva Chafe, MD  PRIMARY CARE FAX:  [phone]  REFERRING:  Bartholomew Crews, NP  PROVIDER:  Jerilee Field, M.D.  LOCATION:  [facility] [phone]     --------------------------------------------------------------------------------   CC/HPI: F/u -   1) BPH with LUTS - wvtt x 6 in 2023. Off meds. BPH since '08. Took finasteride. He had a prostate biopsy that was benign. He has a h/o BPH with prostate 65 grams in 2008 with a PSA of 4.01. He started finasteride. PSA was 2.2 (4.4) in 2000 and 1.79 in 2010 and 1.6 in 2013. PSA 2.4 (4.8) 03/20 and 11/20 1.6 (3.2).   He has frequency urgency and weak stream he has occasional urgency with incontinence. He noticed RGE and poor ejaculation. He gets an erection but not a good one for months. Postvoid 21 mL. He was on finasteride monotherapy and we added tamsulosin Mar 2020. He noticed improvement in flow and LUTS, but recently it has slowed.   Cysto May 2020 with lateral lobe hypertrophy. F/u AUASS 14. Uroflow - equivocal - only voided 50 ml. We tried to pre-cert Rezume and then he had MI 08/21 and mitral valve repair 12/21. He c/o weak st, feeling of incomplete emptying. No dysuria.   Cysto 12/21 with Obstructing. Moderate hyperplasia. Friable vessels left > right prostatic urethra. Continues tams and finast. UA clear. Declined PVR but c/o of some hesitancy and intermittent flow. Now on tamsulosin 0.8 mg po QHS and finasteride. Flow rate 09/22, again voided 43 ml, q max 6. AUASS = 15. Has issues with flow. He noticed RGE. Erections and ejaculation OK.   NG risk includes memory issues. He's had sensitive nipples. AUASS = 18. Some hesitancy. He underwent Rezume [facility] May 2023 with 6 treatments. F/u pvr 6 ml. AUASS  = 8. He has some spraying of the stream at the start. Off meds. PSA was 1.7 in 2023. He has nocturia. PVR 22 ml. Stream adequate.    2) gross hematuria - he was admitted to the hospital for MI Aug 2021 and then underwent mitral valve clip on 12/23/2019. He was then started on anticoagulation with Plavix and now Eliquis. During hospitalization he was noted to have gross hematuria. Renal US 12/27/2019 benign. He had intermittent hematuria which cleared. CYSTOSCOPY12/21 and benign with friable prostate vessels.    Today, seen for the above. He had gross hematuria again in October 2024. CT scan of the abdomen and pelvis November 2024 was benign but there was beam artifact obscuring the prostate. His urine cx was negative. On cystoscopy today-November 2024-he has a good channel in the mid prostate with a more posterior defect. There is also a sizable stone forming on some of the tissue here. Then the bladder neck is tight and there is a median lobe. He complains of pain, not burning, but pain with urination and weak stream.   On Eliquis. Dr. Katrinka Blazing - cardiology. He had another MI.     ALLERGIES: sulfa    MEDICATIONS: Levothyroxine Sodium 75 mcg tablet  Metoprolol Succinate 1 PO Daily  Atorvastatin Calcium 80 mg tablet  Eliquis 5 mg tablet  Ezetimibe 10 mg tablet  Famotidine 20 mg tablet  Furosemide 40 mg tablet  Iron 1 PO Daily  Levocetirizine Dihydrochloride 5 mg tablet  Liothyronine Sodium 5  mcg tablet  Multiple Vitamin  Vitamin D3  Zoloft 1 PO Daily     Notes: finasteride?   GU PSH: Complex Uroflow - 2022, 2021, 2020 Cystoscopy - 2021, 2020 Trurl Dstrj Prst8 Tiss Rf Wv - 2023       PSH Notes: Toe surgery-left foot  heart cath, early june   NON-GU PSH: Coronary Artery Bypass Grafting Explore Carotid Artery Hip Replacement, Bilateral Visit Complexity (formerly GPC1X) - 07/22/2022     GU PMH: Gross hematuria (Stable) - 11/06/2022, Benign eval. Could do TURP or thulium LVP if  needed. , - 2021, - 2021, - 2021 BPH w/LUTS, disc adding back alpha blocker or 5ari. Also oab meds. He will try gemtesa. Offered cystoscopy. Try meds. - 07/22/2022, - 04/05/2022, He is off meds. We disc again the nature r/b od alpha blockers and 5ari. He will potentially take a tamsulosin as needed. , - 10/22/2021, - 07/10/2021, - 2023, restart eliquis in 48 hrs. I sent more abx to continue QHS , - 2023, He is on max med therapy. He has some PV dribble now. We went over the nature r/b/a to WVTT and he will proceed. Disc freq and PV dribble may persist. RIsk of bleeding UTI among others discussed. He would like to get off the prostate meds. , - 2023, On max tams and 5ari. Disc nature r/b/a to Rezume again and he will consider. Usually no RGE with WVTT. , - 2022, - 2022, Discussed going to BID on the tams but he will continue QHS. Continue finasteride. , - 2021, - 2020, - 2020, - 2020, - 2020 Urinary Frequency - 07/22/2022, - 2020 Weak Urinary Stream - 07/22/2022, - 04/05/2022, - 10/22/2021, - 07/10/2021, - 2023, - 2023, - 2023, - 2022, Continue finasteride - discu nature r/b of increasing tams to 0.8 or procedures. Will increase tamsulosin. , - 2022, - 2021, - 2020 Dysuria - 04/05/2022 Elevated PSA, PSA was sent. DRE benign. - 2023, nl DRE today , - 2022, - 2020 ED due to arterial insufficiency - 2020, - 2020      PMH Notes: heart attack earl june   NON-GU PMH: Anxiety Arthritis Depression Heart disease, unspecified Hypercholesterolemia Hypertension Hypothyroidism Myocardial Infarction Sleep Apnea    FAMILY HISTORY: 2 sons - Other Dementia - Father stroke - Mother   SOCIAL HISTORY: Marital Status: Married Preferred Language: English; Race: White Current Smoking Status: Patient does not smoke anymore. Has not smoked since 03/15/1978.   Tobacco Use Assessment Completed: Used Tobacco in last 30 days? Drinks 3 caffeinated drinks per day.    REVIEW OF SYSTEMS:    GU Review Male:   hematuria.  Patient  reports frequent urination, hard to postpone urination, burning/ pain with urination, get up at night to urinate, and leakage of urine. Patient denies stream starts and stops, trouble starting your stream, have to strain to urinate , erection problems, and penile pain.  Gastrointestinal (Upper):   Patient denies nausea, vomiting, and indigestion/ heartburn.  Gastrointestinal (Lower):   Patient denies diarrhea and constipation.  Constitutional:   Patient denies fever, night sweats, weight loss, and fatigue.  Skin:   Patient denies skin rash/ lesion and itching.  Eyes:   Patient denies blurred vision and double vision.  Ears/ Nose/ Throat:   Patient denies sore throat and sinus problems.  Hematologic/Lymphatic:   Patient denies swollen glands and easy bruising.  Cardiovascular:   Patient denies leg swelling and chest pains.  Respiratory:   Patient denies cough and shortness of  breath.  Endocrine:   Patient denies excessive thirst.  Musculoskeletal:   Patient denies joint pain and back pain.  Neurological:   Patient denies headaches and dizziness.  Psychologic:   Patient denies depression and anxiety.   VITAL SIGNS: None   GU PHYSICAL EXAMINATION:    Scrotum: No lesions. No edema. No cysts. No warts.  Urethral Meatus: Normal size. No lesion, no wart, no discharge, no polyp. Normal location.  Penis: Circumcised, no warts, no cracks. No dorsal Peyronie's plaques, no left corporal Peyronie's plaques, no right corporal Peyronie's plaques, no scarring, no warts. No balanitis, no meatal stenosis.   MULTI-SYSTEM PHYSICAL EXAMINATION:    Constitutional: Well-nourished. No physical deformities. Normally developed. Good grooming.  Neck: Neck symmetrical, not swollen. Normal tracheal position.  Respiratory: No labored breathing, no use of accessory muscles.   Cardiovascular: Normal temperature, normal extremity pulses, no swelling, no varicosities.  Skin: No paleness, no jaundice, no cyanosis. No  lesion, no ulcer, no rash.  Neurologic / Psychiatric: Oriented to time, oriented to place, oriented to person. No depression, no anxiety, no agitation.  Gastrointestinal: No mass, no tenderness, no rigidity, non obese abdomen.     Complexity of Data:   04/04/21 11/20/18 04/08/18  PSA  Total PSA 1.71 ng/mL 1.69 ng/mL 2.41 ng/mL    PROCEDURES:         Flexible Cystoscopy - 52000  Risks, benefits, and some of the potential complications of the procedure were discussed with the patient. All questions were answered. Informed consent was obtained. Antibiotic prophylaxis was given -- Cephalexin. Sterile technique and intraurethral analgesia were used.  Meatus:  Normal size. Normal location. Normal condition.  Urethra:  No strictures.  External Sphincter:  Normal.  Verumontanum:  Normal.  Prostate:  Obstructing. Mild hyperplasia. Minimally resected prostate fossa.  Bladder Neck:  Non-obstructing.  Ureteral Orifices:  Normal location. Normal size. Normal shape. Effluxed clear urine.  Bladder:  No trabeculation. No tumors. Normal mucosa. No stones.      The lower urinary tract was carefully examined. The procedure was well-tolerated and without complications. Antibiotic instructions were given. Instructions were given to call the office immediately for bloody urine, difficulty urinating, painful urination, fever, chills, nausea, vomiting or other illness. The patient stated that he understood these instructions and would comply with them.         Urinalysis w/Scope Dipstick Dipstick Cont'd Micro  Color: Yellow Bilirubin: Neg mg/dL WBC/hpf: 10 - 16/XWR  Appearance: Slightly Cloudy Ketones: Neg mg/dL RBC/hpf: 10 - 60/AVW  Specific Gravity: 1.025 Blood: 2+ ery/uL Bacteria: Few (10-25/hpf)  pH: 5.5 Protein: Trace mg/dL Cystals: NS (Not Seen)  Glucose: Neg mg/dL Urobilinogen: 0.2 mg/dL Casts: Hyaline    Nitrites: Neg Trichomonas: Not Present    Leukocyte Esterase: 1+ leu/uL Mucous: Present       Epithelial Cells: 0 - 5/hpf      Yeast: NS (Not Seen)      Sperm: Not Present    ASSESSMENT:      ICD-10 Details  1 GU:   BPH w/LUTS - N40.1 Chronic, Stable - He is got a good channel after Rezum in some places but not others. He also has some dystrophic calcification. I went over with the patient and his wife the nature risk benefits and alternatives to TURP and he will proceed. Discussed postop care and catheter. His prostate is definitely smaller after the Rezum with a shorter prostatic urethral length.  2   Weak Urinary Stream - R39.12 Chronic, Stable  PLAN:           Orders Labs Urine Culture          Schedule Return Visit/Planned Activity: Next Available Appointment - Schedule Surgery          Document Letter(s):  Created for Patient: Clinical Summary         Notes:   cc: Dr. Carmelia Roller     * Signed by Jerilee Field, M.D. on 12/15/22 at 4:04 PM (EST*

## 2022-12-27 ENCOUNTER — Encounter (HOSPITAL_COMMUNITY)
Admission: RE | Disposition: A | Discharge status: Home / Self Care | Payer: Self-pay | Source: Ambulatory Visit | Attending: Urology

## 2022-12-27 ENCOUNTER — Other Ambulatory Visit: Payer: Self-pay

## 2022-12-27 ENCOUNTER — Ambulatory Visit (HOSPITAL_COMMUNITY): Payer: Medicare (Managed Care) | Admitting: Physician Assistant

## 2022-12-27 ENCOUNTER — Ambulatory Visit (HOSPITAL_COMMUNITY): Payer: Medicare (Managed Care)

## 2022-12-27 ENCOUNTER — Ambulatory Visit (HOSPITAL_COMMUNITY)
Admission: RE | Admit: 2022-12-27 | Discharge: 2022-12-27 | Disposition: A | Discharge status: Home / Self Care | Payer: Medicare (Managed Care) | Source: Ambulatory Visit | Attending: Urology | Admitting: Urology

## 2022-12-27 ENCOUNTER — Encounter (HOSPITAL_COMMUNITY): Payer: Self-pay | Admitting: Urology

## 2022-12-27 DIAGNOSIS — N400 Enlarged prostate without lower urinary tract symptoms: Secondary | ICD-10-CM

## 2022-12-27 DIAGNOSIS — I252 Old myocardial infarction: Secondary | ICD-10-CM | POA: Insufficient documentation

## 2022-12-27 DIAGNOSIS — I4891 Unspecified atrial fibrillation: Secondary | ICD-10-CM | POA: Diagnosis not present

## 2022-12-27 DIAGNOSIS — R3912 Poor urinary stream: Secondary | ICD-10-CM | POA: Diagnosis not present

## 2022-12-27 DIAGNOSIS — N401 Enlarged prostate with lower urinary tract symptoms: Secondary | ICD-10-CM | POA: Diagnosis present

## 2022-12-27 DIAGNOSIS — Z79899 Other long term (current) drug therapy: Secondary | ICD-10-CM | POA: Insufficient documentation

## 2022-12-27 DIAGNOSIS — I2510 Atherosclerotic heart disease of native coronary artery without angina pectoris: Secondary | ICD-10-CM | POA: Diagnosis not present

## 2022-12-27 DIAGNOSIS — Z7901 Long term (current) use of anticoagulants: Secondary | ICD-10-CM | POA: Insufficient documentation

## 2022-12-27 DIAGNOSIS — E1151 Type 2 diabetes mellitus with diabetic peripheral angiopathy without gangrene: Secondary | ICD-10-CM | POA: Diagnosis not present

## 2022-12-27 DIAGNOSIS — R350 Frequency of micturition: Secondary | ICD-10-CM | POA: Diagnosis not present

## 2022-12-27 DIAGNOSIS — I4819 Other persistent atrial fibrillation: Secondary | ICD-10-CM

## 2022-12-27 DIAGNOSIS — R310 Gross hematuria: Secondary | ICD-10-CM | POA: Insufficient documentation

## 2022-12-27 DIAGNOSIS — I110 Hypertensive heart disease with heart failure: Secondary | ICD-10-CM | POA: Diagnosis not present

## 2022-12-27 DIAGNOSIS — F32A Depression, unspecified: Secondary | ICD-10-CM | POA: Diagnosis not present

## 2022-12-27 DIAGNOSIS — I351 Nonrheumatic aortic (valve) insufficiency: Secondary | ICD-10-CM | POA: Insufficient documentation

## 2022-12-27 DIAGNOSIS — R3915 Urgency of urination: Secondary | ICD-10-CM | POA: Insufficient documentation

## 2022-12-27 DIAGNOSIS — M1990 Unspecified osteoarthritis, unspecified site: Secondary | ICD-10-CM | POA: Diagnosis not present

## 2022-12-27 DIAGNOSIS — G4730 Sleep apnea, unspecified: Secondary | ICD-10-CM | POA: Diagnosis not present

## 2022-12-27 DIAGNOSIS — Z87891 Personal history of nicotine dependence: Secondary | ICD-10-CM | POA: Diagnosis not present

## 2022-12-27 DIAGNOSIS — N3941 Urge incontinence: Secondary | ICD-10-CM | POA: Diagnosis not present

## 2022-12-27 DIAGNOSIS — I509 Heart failure, unspecified: Secondary | ICD-10-CM | POA: Insufficient documentation

## 2022-12-27 HISTORY — PX: TRANSURETHRAL RESECTION OF PROSTATE: SHX73

## 2022-12-27 LAB — GLUCOSE, CAPILLARY
Glucose-Capillary: 103 mg/dL — ABNORMAL HIGH (ref 70–99)
Glucose-Capillary: 87 mg/dL (ref 70–99)

## 2022-12-27 SURGERY — TURP (TRANSURETHRAL RESECTION OF PROSTATE)
Anesthesia: General | Site: Prostate

## 2022-12-27 MED ORDER — PROPOFOL 10 MG/ML IV BOLUS
INTRAVENOUS | Status: AC
  Filled 2022-12-27: qty 20

## 2022-12-27 MED ORDER — CHLORHEXIDINE GLUCONATE 0.12 % MT SOLN
15.0000 mL | Freq: Once | OROMUCOSAL | Status: AC
Start: 2022-12-27 — End: 2022-12-27
  Administered 2022-12-27: 15 mL via OROMUCOSAL

## 2022-12-27 MED ORDER — OXYCODONE HCL 5 MG PO TABS
5.0000 mg | ORAL_TABLET | Freq: Once | ORAL | Status: AC
  Administered 2022-12-27: 5 mg via ORAL

## 2022-12-27 MED ORDER — SODIUM CHLORIDE 0.9 % IR SOLN
Status: DC | PRN
  Administered 2022-12-27: 3000 mL
  Administered 2022-12-27: 6000 mL

## 2022-12-27 MED ORDER — FENTANYL CITRATE (PF) 100 MCG/2ML IJ SOLN
INTRAMUSCULAR | Status: AC
  Filled 2022-12-27: qty 2

## 2022-12-27 MED ORDER — INSULIN ASPART 100 UNIT/ML IJ SOLN: 0.0000 [IU] | INTRAMUSCULAR | Status: DC | PRN

## 2022-12-27 MED ORDER — EPHEDRINE 5 MG/ML INJ
INTRAVENOUS | Status: AC
  Filled 2022-12-27: qty 5

## 2022-12-27 MED ORDER — ONDANSETRON HCL 4 MG/2ML IJ SOLN
INTRAMUSCULAR | Status: DC | PRN
  Administered 2022-12-27: 4 mg via INTRAVENOUS

## 2022-12-27 MED ORDER — DEXAMETHASONE SODIUM PHOSPHATE 10 MG/ML IJ SOLN
INTRAMUSCULAR | Status: AC
  Filled 2022-12-27: qty 1

## 2022-12-27 MED ORDER — OXYCODONE HCL 5 MG PO TABS
ORAL_TABLET | ORAL | Status: AC
  Filled 2022-12-27: qty 1

## 2022-12-27 MED ORDER — LACTATED RINGERS IV SOLN: INTRAVENOUS | Status: DC

## 2022-12-27 MED ORDER — FENTANYL CITRATE PF 50 MCG/ML IJ SOSY: 25.0000 ug | PREFILLED_SYRINGE | INTRAMUSCULAR | Status: DC | PRN

## 2022-12-27 MED ORDER — LIDOCAINE HCL (CARDIAC) PF 100 MG/5ML IV SOSY
PREFILLED_SYRINGE | INTRAVENOUS | Status: DC | PRN
  Administered 2022-12-27: 80 mg via INTRAVENOUS

## 2022-12-27 MED ORDER — ACETAMINOPHEN 500 MG PO TABS: 1000.0000 mg | ORAL_TABLET | Freq: Once | ORAL | Status: DC

## 2022-12-27 MED ORDER — PHENYLEPHRINE HCL (PRESSORS) 10 MG/ML IV SOLN
INTRAVENOUS | Status: DC | PRN
  Administered 2022-12-27: 40 ug via INTRAVENOUS
  Administered 2022-12-27: 80 ug via INTRAVENOUS
  Administered 2022-12-27: 120 ug via INTRAVENOUS
  Administered 2022-12-27: 80 ug via INTRAVENOUS

## 2022-12-27 MED ORDER — ONDANSETRON HCL 4 MG/2ML IJ SOLN
INTRAMUSCULAR | Status: AC
  Filled 2022-12-27: qty 2

## 2022-12-27 MED ORDER — DROPERIDOL 2.5 MG/ML IJ SOLN: 0.6250 mg | Freq: Once | INTRAMUSCULAR | Status: DC | PRN

## 2022-12-27 MED ORDER — SODIUM CHLORIDE 0.9 % IV SOLN
2.0000 g | INTRAVENOUS | Status: AC
  Administered 2022-12-27: 2 g via INTRAVENOUS
  Filled 2022-12-27: qty 20

## 2022-12-27 MED ORDER — DEXAMETHASONE SODIUM PHOSPHATE 10 MG/ML IJ SOLN
INTRAMUSCULAR | Status: DC | PRN
  Administered 2022-12-27: 4 mg via INTRAVENOUS

## 2022-12-27 MED ORDER — EPHEDRINE SULFATE-NACL 50-0.9 MG/10ML-% IV SOSY
PREFILLED_SYRINGE | INTRAVENOUS | Status: DC | PRN
  Administered 2022-12-27: 5 mg via INTRAVENOUS

## 2022-12-27 MED ORDER — ORAL CARE MOUTH RINSE: 15.0000 mL | Freq: Once | OROMUCOSAL | Status: AC

## 2022-12-27 MED ORDER — PROPOFOL 10 MG/ML IV BOLUS
INTRAVENOUS | Status: DC | PRN
  Administered 2022-12-27: 140 mg via INTRAVENOUS
  Administered 2022-12-27: 40 mg via INTRAVENOUS

## 2022-12-27 MED ORDER — LIDOCAINE HCL (PF) 2 % IJ SOLN
INTRAMUSCULAR | Status: AC
  Filled 2022-12-27: qty 5

## 2022-12-27 MED ORDER — APIXABAN 5 MG PO TABS: 5.0000 mg | ORAL_TABLET | Freq: Two times a day (BID) | ORAL | Status: DC

## 2022-12-27 MED ORDER — FENTANYL CITRATE (PF) 100 MCG/2ML IJ SOLN
INTRAMUSCULAR | Status: DC | PRN
  Administered 2022-12-27: 50 ug via INTRAVENOUS
  Administered 2022-12-27 (×2): 25 ug via INTRAVENOUS

## 2022-12-27 SURGICAL SUPPLY — 18 items
BAG URINE DRAIN 2000ML AR STRL (UROLOGICAL SUPPLIES) ×1 IMPLANT
BAG URO CATCHER STRL LF (MISCELLANEOUS) ×1 IMPLANT
CATH COUDE 22FR 5CC (CATHETERS) IMPLANT
DRAPE FOOT SWITCH (DRAPES) ×1 IMPLANT
EVACUATOR MICROVAS BLADDER (UROLOGICAL SUPPLIES) ×1 IMPLANT
GLOVE SURG LX STRL 7.5 STRW (GLOVE) ×1 IMPLANT
GOWN STRL REUS W/ TWL XL LVL3 (GOWN DISPOSABLE) ×1 IMPLANT
HOLDER FOLEY CATH W/STRAP (MISCELLANEOUS) IMPLANT
KIT TURNOVER KIT A (KITS) IMPLANT
LOOP CUT BIPOLAR 24F LRG (ELECTROSURGICAL) IMPLANT
MANIFOLD NEPTUNE II (INSTRUMENTS) ×1 IMPLANT
PACK CYSTO (CUSTOM PROCEDURE TRAY) ×1 IMPLANT
PAD PREP 24X48 CUFFED NSTRL (MISCELLANEOUS) ×1 IMPLANT
SYR 30ML LL (SYRINGE) IMPLANT
SYR TOOMEY IRRIG 70ML (MISCELLANEOUS) ×1
SYRINGE TOOMEY IRRIG 70ML (MISCELLANEOUS) ×1 IMPLANT
TUBING CONNECTING 10 (TUBING) ×1 IMPLANT
TUBING UROLOGY SET (TUBING) ×1 IMPLANT

## 2022-12-27 NOTE — Anesthesia Postprocedure Evaluation (Signed)
Anesthesia Post Note  Patient: Raymond Christensen  Procedure(s) Performed: TRANSURETHRAL RESECTION OF THE PROSTATE (TURP) (Prostate)     Patient location during evaluation: PACU Anesthesia Type: General Level of consciousness: sedated and patient cooperative Pain management: pain level controlled Vital Signs Assessment: post-procedure vital signs reviewed and stable Respiratory status: spontaneous breathing Cardiovascular status: stable Anesthetic complications: no   No notable events documented.  Last Vitals:  Vitals:   12/27/22 1311 12/27/22 1415  BP: (!) 146/89 (!) 148/84  Pulse: 70   Resp: 12 16  Temp: 36.4 C 36.6 C  SpO2: 100% 100%    Last Pain:  Vitals:   12/27/22 1415  TempSrc:   PainSc: 3                  Lewie Loron

## 2022-12-27 NOTE — Discharge Instructions (Signed)
Removal of the catheter: Remove the catheter as instructed on Monday morning, December 30, 2022.  There is 15 mL in the balloon. Ensure the balloon is empty.

## 2022-12-27 NOTE — Interval H&P Note (Signed)
History and Physical Interval Note:  12/27/2022 10:48 AM  Raymond Christensen  has presented today for surgery, with the diagnosis of Benign prostatic hyperplasia.  The various methods of treatment have been discussed with the patient and family. After consideration of risks, benefits and other options for treatment, the patient has consented to  Procedure(s) with comments: TRANSURETHRAL RESECTION OF THE PROSTATE (TURP) (N/A) - 75 minute case as a surgical intervention.  Patient continues to have pain with voiding and for a short time after in the urethra.  His urine culture from the office was negative.  He has had no fever.  He also continues to have gross hematuria and had some red urine and small clots a couple of days ago.  Otherwise doing well with no cough cold or congestion.  Discussed postop care and Foley catheter.  If he is doing well I may have him remove his catheter at home 1 morning next week and then he will have follow-up later in the the week.  The patient's history has been reviewed, patient examined, no change in status, stable for surgery.  I have reviewed the patient's chart and labs.  Questions were answered to the patient's satisfaction.     Jerilee Field

## 2022-12-27 NOTE — Op Note (Signed)
Preoperative diagnosis: BPH, gross hematuria Postoperative diagnosis: Same  Procedure: Transurethral resection of prostate  Surgeon: Mena Goes  Anesthesia: General  Indication for procedure: Raymond Christensen is an 82 year old male with a history of BPH and lower urinary tract symptoms.  He underwent a Rezum water vapor thermal therapy but is recently had some gross hematuria and pain when voiding.  Cystoscopy revealed some dystrophic calcification in the prostatic urethra with a good channel and some areas but tight mid prostatic through the bladder neck.  It appeared the Rezum had removed tissue posterior and left lateral but not so much in the mid prostate and up laterally toward the bladder neck.  Findings: On cystoscopy the urethra was unremarkable, prostatic urethra was obstructed in the mid prostate with some fusion of the tissue.  The Rezum had removed prostatic tissue back to the capsule posteriorly and left laterally with there was fusion or residual lateral lobe tissue from the mid prostate up through the bladder neck with some intravesical prostate.  This was resected which created an excellent channel.  On DRE the prostate was about 40 g and smooth without hard area or nodule.  The glans and meatus appeared normal.  Description of procedure: After consent was obtained patient brought to the operating room.  After adequate anesthesia he is placed in lithotomy position and prepped and draped in the usual sterile fashion.  Timeout was performed to confirm the patient and procedure.  The cystoscope was passed per urethra the bladder inspected.  I then dilated the fossa navicularis to 28 [place] and then passed the continuous-flow sheath with the visual obturator.  I swapped that out for the loop and handle and began resecting at the patient's 7 o'clock position right side and incised through the prostatic adenoma down found the bladder neck and brought that down to the verumontanum in line with the right  ureteral orifice.  This connected the resection posteriorly from the Rezum which created a good channel in the mid.  There was some residual light lateral tissue and some residual left lobe.  I made a similar incision at 5:00 in line with the left ureteral orifice down to the bladder neck and brought that into the prostatic urethra.  Some residual lateral lobe tissue was resected.  This created an excellent channel.  A slight amount of intravesical prostate on the right and left side was resected up with the bladder neck anterior to posterior.  Again good channel.  Ureteral orifice ease were identified pre and post resection noted to be normal.  They were well away from the resection.  Hemostasis was excellent at low pressure.  All the chips were evacuated.  I did leave some tissue apically around the verumontanum.  The scope was backed out and a 22 [place] coud catheter was advanced left to gravity drainage.  Urine was clear.  Exam under anesthesia was performed.  He was awakened taken the cover room in stable condition.  Complications: None  Blood loss: Minimal  Specimens: Prostate chips to pathology  Drains: 22 French coud catheter-15 cc in the balloon.  Disposition: Patient stable to PACU.

## 2022-12-27 NOTE — Transfer of Care (Signed)
Immediate Anesthesia Transfer of Care Note  Patient: Raymond Christensen  Procedure(s) Performed: TRANSURETHRAL RESECTION OF THE PROSTATE (TURP) (Prostate)  Patient Location: PACU  Anesthesia Type:General  Level of Consciousness: awake  Airway & Oxygen Therapy: Patient Spontanous Breathing  Post-op Assessment: Report given to RN and Post -op Vital signs reviewed and stable  Post vital signs: Reviewed and stable  Last Vitals:  Vitals Value Taken Time  BP 143/68 12/27/22 1154  Temp    Pulse 35 12/27/22 1156  Resp 12 12/27/22 1156  SpO2 96 % 12/27/22 1156  Vitals shown include unfiled device data.  Last Pain:  Vitals:   12/27/22 0855  TempSrc: Oral  PainSc:          Complications: No notable events documented.

## 2022-12-27 NOTE — Anesthesia Procedure Notes (Signed)
Procedure Name: LMA Insertion Date/Time: 12/27/2022 11:48 AM  Performed by: Sampson Goon, CRNAPre-anesthesia Checklist: Patient identified, Emergency Drugs available, Suction available and Patient being monitored Patient Re-evaluated:Patient Re-evaluated prior to induction Oxygen Delivery Method: Circle System Utilized Preoxygenation: Pre-oxygenation with 100% oxygen Induction Type: IV induction LMA: LMA inserted LMA Size: 4.0 Number of attempts: 1 Placement Confirmation: positive ETCO2 Tube secured with: Tape Dental Injury: Teeth and Oropharynx as per pre-operative assessment

## 2022-12-28 ENCOUNTER — Encounter (HOSPITAL_COMMUNITY): Payer: Self-pay | Admitting: Urology

## 2022-12-30 LAB — SURGICAL PATHOLOGY

## 2023-01-03 ENCOUNTER — Ambulatory Visit: Payer: Medicare (Managed Care) | Attending: Internal Medicine | Admitting: Internal Medicine

## 2023-01-03 VITALS — BP 130/60 | HR 69 | Ht 67.0 in | Wt 198.8 lb

## 2023-01-03 DIAGNOSIS — I5022 Chronic systolic (congestive) heart failure: Secondary | ICD-10-CM

## 2023-01-03 DIAGNOSIS — I480 Paroxysmal atrial fibrillation: Secondary | ICD-10-CM

## 2023-01-03 DIAGNOSIS — I059 Rheumatic mitral valve disease, unspecified: Secondary | ICD-10-CM

## 2023-01-03 DIAGNOSIS — I493 Ventricular premature depolarization: Secondary | ICD-10-CM

## 2023-01-03 DIAGNOSIS — I25110 Atherosclerotic heart disease of native coronary artery with unstable angina pectoris: Secondary | ICD-10-CM | POA: Diagnosis not present

## 2023-01-03 DIAGNOSIS — Z9889 Other specified postprocedural states: Secondary | ICD-10-CM

## 2023-01-03 DIAGNOSIS — Z95818 Presence of other cardiac implants and grafts: Secondary | ICD-10-CM

## 2023-01-03 NOTE — Progress Notes (Signed)
Cardiology Office Note:  .    Date:  01/03/2023  ID:  Skyler Schramel, DOB 1940-11-29, MRN [id number] PCP: Sharlene Dory, DO  [facility] Providers Cardiologist:  Christell Constant, MD Electrophysiologist:  Regan Lemming, MD     CC: Follow up Bleeding  History of Present Illness: .    Darrol Babin is a 82 y.o. male with CAD and prior graft failure in 2024, MR s/p MitraClip, bleeding after TURP and PAF.  Discussed the use of AI scribe software for clinical note transcription with the patient, who gave verbal consent to proceed.  Vikas Leis, an 82 year old individual with a complex medical history including coronary artery disease, atrial fibrillation status post ablation, prostate issues, and significant mitral regurgitation status post mitral clip, presented for a follow-up visit. He had undergone bypass surgery and experienced SVG graft failure in June 2024, which was managed medically due to the inability to intervene surgically.  In July 2024, he was found to have moderate mitral regurgitation without single leaflet detachment of the mitral clip. In September 2024, he underwent a preoperative risk evaluation prior to surgery. Postoperatively, he experienced significant bleeding following a transurethral resection of the prostate (TURP), leading to a request from his urologist to halt anticoagulation and aspirin therapy for re-evaluation.  Despite these complications, he reported feeling generally well. On physical examination, a systolic murmur was noted. He had a history of bruising while on anticoagulation therapy.   Relevant histories: .  Social married; wife comes to visits; former Dr. Katrinka Blazing patient. ROS: As per HPI.   Studies Reviewed: .   Cardiac Studies & Procedures   CARDIAC CATHETERIZATION  CARDIAC CATHETERIZATION 06/26/2022  Narrative   Mid LM to Dist LM lesion is 70% stenosed.   Mid RCA to Dist RCA lesion is 100% stenosed.   Prox  RCA to Mid RCA lesion is 90% stenosed.   2nd Diag lesion is 75% stenosed.   1st Mrg lesion is 75% stenosed.   2nd Mrg-1 lesion is 80% stenosed.   1st Diag lesion is 40% stenosed.   Mid LAD lesion is 75% stenosed.   2nd Mrg-2 lesion is 100% stenosed.   Prox Graft to Mid Graft lesion before 2nd Mrg  is 95% stenosed.   Dist Graft to Insertion lesion before 2nd Mrg  is 85% stenosed.   Prox Graft to Mid Graft lesion between 2nd Mrg and RPDA  is 100% stenosed.   Dist Graft to Insertion lesion between 2nd Mrg and RPDA  is 100% stenosed.   There is moderate left ventricular systolic dysfunction.   LV end diastolic pressure is mildly elevated.   The left ventricular ejection fraction is 35-45% by visual estimate.   Recommend to resume Apixaban, at currently prescribed dose and frequency.   Recommend concurrent antiplatelet therapy of Aspirin 81 mg daily.  Severe multivessel CAD with 70% eccentric distal left main stenosis; 40% stenosis in the first diagonal vessel, 75 to 80% stenosis in the LAD between the first and second diagonal vessel with diffuse 70% stenosis in the second diagonal vessel.  There is competitive filling of the mid LAD due to LIMA graft flow.  Diffuse irregularity of the left circumflex vessel with 20% proximal stenosis, 70% ostial stenosis in the first marginal branch, small second marginal branch with mid occlusion, and 50% mid AV groove circumflex stenosis.  Previously noted high-grade mid RCA stenosis with total occlusion before the acute margin.  There are faint collaterals supplying the very distal RCA  via the left coronary injection.  Patent LIMA graft supplying the mid LAD.  Severely diseased questional SVG with new 95% proximal graft stenosis, extensive thrombus with 85% stenosis in the stent in the region of the OM 2 vessel, and total occlusion of the more distal stent in the vein graft with complete occlusion of the distal graft prior to anastomosing into the distal  RCA.  Moderate LV dysfunction with EF estimate approximately 35 to 45%.  LVEDP 17 mm.  RECOMMENDATION: Angiograms were reviewed with Dr. [place].  Medical therapy is recommended.  Initiate anti-ischemic medication and guideline directed medical therapy for reduced LV function/ischemic cardiomyopathy.  Plan to resume Eliquis tomorrow.  Findings Coronary Findings Diagnostic  Dominance: Right  Left Main Mid LM to Dist LM lesion is 70% stenosed.  Left Anterior Descending Mid LAD lesion is 75% stenosed.  First Diagonal Branch 1st Diag lesion is 40% stenosed.  Second Diagonal Branch 2nd Diag lesion is 75% stenosed.  Left Circumflex  First Obtuse Marginal Branch 1st Mrg lesion is 75% stenosed.  Second Obtuse Marginal Branch 2nd Mrg-1 lesion is 80% stenosed. 2nd Mrg-2 lesion is 100% stenosed.  Right Coronary Artery Prox RCA to Mid RCA lesion is 90% stenosed. Mid RCA to Dist RCA lesion is 100% stenosed.  LIMA Graft To Dist LAD  Sequential Graft To 2nd Mrg, RPDA Prox Graft to Mid Graft lesion before 2nd Mrg  is 95% stenosed. Dist Graft to Insertion lesion before 2nd Mrg  is 85% stenosed. The lesion was previously treated . Prox Graft to Mid Graft lesion between 2nd Mrg and RPDA  is 100% stenosed. The lesion was previously treated . Dist Graft to Insertion lesion between 2nd Mrg and RPDA  is 100% stenosed.  Intervention  No interventions have been documented.   CARDIAC CATHETERIZATION  CARDIAC CATHETERIZATION 12/23/2019  Narrative Successful transcatheter edge-to-edge mitral valve repair using a MitraClip XTW device, positioned A2/P2, reducing mitral regurgitation from 4+ to 1+.  Procedure complicated by transient bradycardic arrest likely precipitated by coronary air embolism, with prompt resuscitation and return to baseline hemodynamics/cardiac function  Recommendations: Continue clopidogrel, resume apixaban tomorrow morning, assess recovery in the post cardiac  catheterization recovery area to determine bed placement in either a cardiac progressive care bed versus ICU    ECHOCARDIOGRAM  ECHOCARDIOGRAM COMPLETE 09/23/2022  Narrative ECHOCARDIOGRAM REPORT    Patient Name:   DAVELLE BEITZEL Date of Exam: 09/23/2022 Medical Rec #:  [id number]      Height:       67.0 in Accession #:    [id number]     Weight:       208.4 lb Date of Birth:  05/05/1940      BSA:          2.058 m Patient Age:    81 years       BP:           126/58 mmHg Patient Gender: M              HR:           66 bpm. Exam Location:  [address]  Procedure: 2D Echo, Cardiac Doppler and Color Doppler  Indications:    I25.5 Ischemic cardiomyopathy  History:        Patient has prior history of Echocardiogram examinations, most recent 06/26/2022. Ischemic cardiomyopathy and CHF, CAD and Previous Myocardial Infarction, Prior CABG and Abnormal ECG, Mitra-clip, Arrythmias:Atrial Fibrillation; Risk Factors:Hypertension and Dyslipidemia.  Sonographer:    Samule Ohm RDCS Referring Phys:  [id number] TESSA N CONTE  IMPRESSIONS   1. Apical window is foreshortened, making accurate assessment of LVEF difficult . Left ventricular ejection fraction, by estimation, is 45 to 50%. The left ventricle has mildly decreased function. The left ventricle demonstrates global hypokinesis. There is mild concentric left ventricular hypertrophy. Left ventricular diastolic parameters are consistent with Grade II diastolic dysfunction (pseudonormalization). Elevated left atrial pressure. 2. Right ventricular systolic function is mildly reduced. The right ventricular size is mildly enlarged. 3. Left atrial size was severely dilated. 4. Right atrial size was moderately dilated. 5. S/p MtriaClip (one XTW; procedure date 12/23/19). MR is very eccentric, directed posterior into LA Appears moderate. More prominent when compared to echo from 2022.. The mitral valve has been repaired/replaced. Moderate mitral valve  regurgitation. 6. The aortic valve is tricuspid. Aortic valve regurgitation is mild.  FINDINGS Left Ventricle: Apical window is foreshortened, making accurate assessment of LVEF difficult. Left ventricular ejection fraction, by estimation, is 45 to 50%. The left ventricle has mildly decreased function. The left ventricle demonstrates global hypokinesis. The left ventricular internal cavity size was normal in size. There is mild concentric left ventricular hypertrophy. Left ventricular diastolic parameters are consistent with Grade II diastolic dysfunction (pseudonormalization). Elevated left atrial pressure.  Right Ventricle: The right ventricular size is mildly enlarged. Right vetricular wall thickness was not assessed. Right ventricular systolic function is mildly reduced.  Left Atrium: Left atrial size was severely dilated.  Right Atrium: Right atrial size was moderately dilated.  Pericardium: There is no evidence of pericardial effusion.  Mitral Valve: S/p MtriaClip (one XTW; procedure date 12/23/19). MR is very eccentric, directed posterior into LA Appears moderate. More prominent when compared to echo from 2022. The mitral valve has been repaired/replaced. There is mild thickening of the mitral valve leaflet(s). Moderate mitral valve regurgitation. MV peak gradient, 7.1 mmHg. The mean mitral valve gradient is 3.0 mmHg.  Tricuspid Valve: The tricuspid valve is normal in structure. Tricuspid valve regurgitation is mild.  Aortic Valve: The aortic valve is tricuspid. Aortic valve regurgitation is mild. Aortic regurgitation PHT measures 409 msec.  Pulmonic Valve: The pulmonic valve was normal in structure. Pulmonic valve regurgitation is mild.  Aorta: The aortic root and ascending aorta are structurally normal, with no evidence of dilitation.  IAS/Shunts: No atrial level shunt detected by color flow Doppler.   LEFT VENTRICLE PLAX 2D LVIDd:         5.40 cm   Diastology LVIDs:          4.20 cm   LV e' medial:    6.96 cm/s LV PW:         1.20 cm   LV E/e' medial:  17.7 LV IVS:        1.20 cm   LV e' lateral:   9.14 cm/s LVOT diam:     2.10 cm   LV E/e' lateral: 13.5 LV SV:         63 LV SV Index:   30 LVOT Area:     3.46 cm   RIGHT VENTRICLE            IVC RV S prime:     9.14 cm/s  IVC diam: 1.40 cm TAPSE (M-mode): 1.6 cm  LEFT ATRIUM              Index        RIGHT ATRIUM           Index LA diam:  5.20 cm  2.53 cm/m   RA Pressure: 3.00 mmHg LA Vol (A2C):   112.0 ml 54.42 ml/m  RA Area:     24.20 cm LA Vol (A4C):   101.0 ml 49.08 ml/m  RA Volume:   82.70 ml  40.19 ml/m LA Biplane Vol: 108.0 ml 52.48 ml/m AORTIC VALVE LVOT Vmax:   80.60 cm/s LVOT Vmean:  53.200 cm/s LVOT VTI:    0.181 m AI PHT:      409 msec  AORTA Ao Root diam: 3.40 cm Ao Asc diam:  3.30 cm  MITRAL VALVE                TRICUSPID VALVE MV Area (PHT): 3.30 cm     Estimated RAP:  3.00 mmHg MV Peak grad:  7.1 mmHg MV Mean grad:  3.0 mmHg     SHUNTS MV Vmax:       1.33 m/s     Systemic VTI:  0.18 m MV Vmean:      74.5 cm/s    Systemic Diam: 2.10 cm MV Decel Time: 230 msec MV E velocity: 123.00 cm/s MV A velocity: 70.70 cm/s MV E/A ratio:  1.74  Dietrich Pates MD Electronically signed by Dietrich Pates MD Signature Date/Time: 09/23/2022/7:04:39 PM    Final  TEE  ECHO TEE 02/01/2020  Narrative TRANSESOPHOGEAL ECHO REPORT    Patient Name:   RAYLYN ARONS Date of Exam: 02/01/2020 Medical Rec #:  [id number]      Height:       68.0 in Accession #:    [id number]     Weight:       232.0 lb Date of Birth:  [birth year]      BSA:          2.177 m Patient Age:    79 years       BP:           100/69 mmHg Patient Gender: M              HR:           81 bpm. Exam Location:  Inpatient  Procedure: Transesophageal Echo and 3D Echo  Indications:     Atrial fibrillation ablation  History:         Patient has prior history of Echocardiogram examinations, most recent 01/24/2020. CHF, CAD,  Mitral Valve Disease, Arrythmias:Atrial Fibrillation; Risk Factors:Diabetes, Hypertension, Sleep Apnea and Former Smoker. S/P mitral valve clip implantation.  Sonographer:     Ross Ludwig RDCS (AE) Referring Phys:  [id number] WILL MARTIN CAMNITZ Diagnosing Phys: [name] MD  PROCEDURE: After discussion of the risks and benefits of a TEE, an informed consent was obtained from the patient. The transesophogeal probe was passed without difficulty through the esophogus of the patient. Sedation performed by different physician. The patient was monitored while under deep sedation. Anesthestetic sedation was provided intravenously by Anesthesiology: 140mg  of Propofol, 80mg  of Lidocaine. Image quality was good. The patient developed no complications during the procedure.  IMPRESSIONS   1. Left ventricular ejection fraction, by estimation, is 40 to 45%. The left ventricle has mildly decreased function. The left ventricle demonstrates global hypokinesis. Left ventricular diastolic function could not be evaluated. 2. Right ventricular systolic function is mildly reduced. The right ventricular size is mildly enlarged. 3. Left atrial size was severely dilated. No left atrial/left atrial appendage thrombus was detected. 4. Right atrial size was moderately dilated. 5. The mitral valve has been repaired/replaced- stable MitraClip placement. Mild mitral valve  regurgitation. 6. The aortic valve is tricuspid. There is mild thickening of the aortic valve. Aortic valve regurgitation is mild. 7. There is mild (Grade II) plaque. 8. Evidence of atrial level shunting detected by color flow Doppler.  Comparison(s): A prior study was performed on 12/23/19. No LAA thrombus since last procedure; improved MR at present loading conditions.  FINDINGS Left Ventricle: Left ventricular ejection fraction, by estimation, is 40 to 45%. The left ventricle has mildly decreased function. The left ventricle demonstrates  global hypokinesis. The left ventricular internal cavity size was normal in size. Left ventricular diastolic function could not be evaluated.  Right Ventricle: The right ventricular size is mildly enlarged. Right vetricular wall thickness was not assessed. Right ventricular systolic function is mildly reduced.  Left Atrium: Left atrial size was severely dilated. No left atrial/left atrial appendage thrombus was detected.  Right Atrium: Right atrial size was moderately dilated.  Pericardium: There is no evidence of pericardial effusion.  Mitral Valve: Mitra Clip stable positioning with mitral regurgitation. The mitral valve has been repaired/replaced. Mild mitral valve regurgitation. There is a Mitra-Clip present in the mitral position.  Tricuspid Valve: The tricuspid valve is normal in structure. Tricuspid valve regurgitation is mild.  Aortic Valve: The aortic valve is tricuspid. There is mild thickening of the aortic valve. Aortic valve regurgitation is mild.  Pulmonic Valve: The pulmonic valve was normal in structure. Pulmonic valve regurgitation is trivial.  Aorta: The aortic root and ascending aorta are structurally normal, with no evidence of dilitation. There is mild (Grade II) plaque.  IAS/Shunts: Evidence of atrial level shunting detected by color flow Doppler.    AORTA Ao Asc diam: 2.90 cm  [name] MD Electronically signed by [name] MD Signature Date/Time: 02/01/2020/12:33:15 PM    Final  MONITORS  LONG TERM MONITOR (3-14 DAYS) 06/13/2022  Narrative   Patient had a minimum heart rate of 51 bpm, maximum heart rate of 169 bpm, and average heart rate of 70 bpm.   Predominant underlying rhythm was sinus rhythm.   One 12 beat run of NSVT.   Short runs of regular, relatively slow SVT, with longest lasting 16 seconds at ~ 118 bpm.   Isolated PACs were rare (<1.0%).   Isolated PVCs were frequent (10%).   No symptoms triggered.  Asymptomatic  frequent PVCs.            Physical Exam:    VS:  BP 130/60   Pulse 69   Ht 5\' 7"  (1.702 m)   Wt 198 lb 12.8 oz (90.2 kg)   SpO2 97%   BMI 31.14 kg/m    Wt Readings from Last 3 Encounters:  01/03/23 198 lb 12.8 oz (90.2 kg)  12/27/22 195 lb (88.5 kg)  12/19/22 195 lb (88.5 kg)    Gen: no distress Neck: No JVD Cardiac: No Rubs or Gallops, systolic Murmur Respiratory: Clear to auscultation bilaterally, normal effort, normal  respiratory rate GI: Soft, nontender, non-distended  MS: No  edema;  moves all extremities Integument: Skin feels warm Neuro:  At time of evaluation, alert and oriented to person/place/time/situation  Psych: Normal affect, patient feels ok   ASSESSMENT AND PLAN: .    Atrial Fibrillation Atrial fibrillation status post ablation. Anticoagulation management complicated by recent bleeding post-TURP. Currently on Eliquis and aspirin, but reassessment needed due to bleeding. Discussed Watchman device as an alternative to reduce stroke risk in patients unable to take long-term anticoagulation. Risks include procedural complications and device-related thrombus; benefits include reduced bleeding  risk and significant stroke risk reduction. - Consult urology (Dr. Mena Goes) to determine when anticoagulation can be restarted - If no response from Dr. Lennox Laity due to the holidays and no further bleeding, restart Eliquis in January 2025 - Discontinue aspirin - Refer to Dr. Jimmey Ralph or Dr. Lalla Brothers in February 2025 for Watchman device evaluation - Contact urology and cardiology if bleeding issues occur in January - Schedule follow-up with clinical cardiology team if EP gets cancelled - Plan stop-gap visit in September 2025 post-echocardiogram  Mitral Regurgitation Moderate mitral regurgitation status post MitraClip without single leaflet detachment as of July 2024. - Order echocardiogram in September 2025 to assess MitraClip status  Coronary Artery Disease Coronary  artery disease with previous bypass surgery and SVG graft failure in June 2024. Managed medically post unsuccessful intervention of SVG - Continue current medical management, save for ASA stop as above  General Health Maintenance Discussed in the context of anticoagulation management and follow-up care. - Ensure follow-up with clinical cardiology team - Schedule echocardiogram in September 2025    [name], MD FASE [facility] Cardiologist [facility]  [address] [place], [address] [place], [place] [address] [phone]  11:22 AM

## 2023-01-03 NOTE — Patient Instructions (Signed)
Medication Instructions:  Your physician has recommended you make the following change in your medication:  STOP: Aspirin RESTART: apixaban (Eliquis) in Jan 2025  *If you need a refill on your cardiac medications before your next appointment, please call your pharmacy*   Lab Work: NONE  If you have labs (blood work) drawn today and your tests are completely normal, you will receive your results only by: MyChart Message (if you have MyChart) OR A paper copy in the mail If you have any lab test that is abnormal or we need to change your treatment, we will call you to review the results.   Testing/Procedures: Your physician has referred you to see an Electrophysiologist (EP) Doctor.    SEPT 2025 - - - Your physician has requested that you have an echocardiogram. Echocardiography is a painless test that uses sound waves to create images of your heart. It provides your doctor with information about the size and shape of your heart and how well your heart's chambers and valves are working. This procedure takes approximately one hour. There are no restrictions for this procedure. Please do NOT wear cologne, perfume, aftershave, or lotions (deodorant is allowed). Please arrive 15 minutes prior to your appointment time.  Please note: We ask at that you not bring children with you during ultrasound (echo/ vascular) testing. Due to room size and safety concerns, children are not allowed in the ultrasound rooms during exams. Our front office staff cannot provide observation of children in our lobby area while testing is being conducted. An adult accompanying a patient to their appointment will only be allowed in the ultrasound room at the discretion of the ultrasound technician under special circumstances. We apologize for any inconvenience.    Follow-Up: At [facility], you and your health needs are our priority.  As part of our continuing mission to provide you with exceptional heart  care, we have created designated Provider Care Teams.  These Care Teams include your primary Cardiologist (physician) and Advanced Practice Providers (APPs -  Physician Assistants and Nurse Practitioners) who all work together to provide you with the care you need, when you need it.   Your next appointment:   9 month(s)  Provider:   Christell Constant, MD

## 2023-01-08 ENCOUNTER — Other Ambulatory Visit: Payer: Self-pay | Admitting: Family Medicine

## 2023-01-09 ENCOUNTER — Telehealth: Payer: Self-pay

## 2023-01-09 NOTE — Telephone Encounter (Signed)
[employer] Watchman [employer] report for January 2022 cCT:

## 2023-01-10 ENCOUNTER — Encounter: Payer: Self-pay | Admitting: Family Medicine

## 2023-01-10 ENCOUNTER — Ambulatory Visit: Payer: Medicare (Managed Care) | Admitting: Family Medicine

## 2023-01-10 VITALS — BP 134/80 | HR 73 | Temp 97.8°F | Resp 16 | Ht 67.0 in | Wt 199.6 lb

## 2023-01-10 DIAGNOSIS — F411 Generalized anxiety disorder: Secondary | ICD-10-CM | POA: Diagnosis not present

## 2023-01-10 DIAGNOSIS — J310 Chronic rhinitis: Secondary | ICD-10-CM

## 2023-01-10 DIAGNOSIS — F330 Major depressive disorder, recurrent, mild: Secondary | ICD-10-CM

## 2023-01-10 MED ORDER — IPRATROPIUM BROMIDE 0.03 % NA SOLN: 2.0000 | Freq: Two times a day (BID) | NASAL | 12 refills | Status: DC

## 2023-01-10 NOTE — Addendum Note (Signed)
Addended by: Radene Gunning on: 01/10/2023 10:29 AM   Modules accepted: Level of Service

## 2023-01-10 NOTE — Progress Notes (Signed)
Chief Complaint  Patient presents with   Anxiety    Discuss Mood    Subjective Raymond Christensen presents for f/u anxiety/depression. Here w spouse.   Pt is currently being treated with Paxil 10 mg/d. He has been on Zoloft, Effexor, Trileptal, Lexapro, Cymbalta, Wellbutrin, BuSpar, Abilify.  Reports no improvement since treatment. No thoughts of harming self or others. No self-medication with alcohol, prescription drugs or illicit drugs. Pt is not following with a counselor/psychologist. He saw a psychiatrist in the past and it was not a helpful situation.   Patient has had some nasal drainage mainly in the mornings.  Some associated congestion.  No sinus pain, sore throat, fevers, coughing, or shortness of breath.  Is currently taking Zyrtec and Flonase.  He reports compliance and no adverse effects.  It does not seem to be helping with the symptoms.  Past Medical History:  Diagnosis Date   Arthritis    CAD (coronary artery disease)    Cancer (HCC)    skin lt. arm.   Carotid artery disease (HCC)    s/p left CEA 01/17/14   Chronic systolic CHF (congestive heart failure) [facility])    Concussion Summer 2012   Essential hypertension 02/26/2017   Heart disease    Hyperlipidemia    Hypothyroidism 10/15/2017   Major depressive disorder    Mitral valve regurgitation    Myocardial infarction (HCC) 1996, 2021   Obstructive sleep apnea on CPAP    Persistent atrial fibrillation (HCC)    Poor flexibility of tendon 12/01/2018   S/P mitral valve clip implantation 12/23/2019   s/p TEER with MitraClip with one XTW by Dr. Excell Seltzer   Subungual hematoma of digit of hand 12/01/2018   Subungual hematoma of right foot 12/01/2018   Type 2 diabetes mellitus without complication, without long-term current use of insulin (HCC) 02/26/2017   Allergies as of 01/10/2023       Reactions   Sulfa Antibiotics Rash   Questionable, he developed a diffuse rash 2 days after stopping Bactrim        Medication List         Accurate as of January 10, 2023  9:07 AM. If you have any questions, ask your nurse or doctor.          acetaminophen 500 MG tablet Commonly known as: TYLENOL Take 1,000 mg by mouth every 6 (six) hours as needed for moderate pain or headache.   apixaban 5 MG Tabs tablet Commonly known as: Eliquis Take 1 tablet (5 mg total) by mouth 2 (two) times daily.   atorvastatin 80 MG tablet Commonly known as: LIPITOR Take 1 tablet (80 mg total) by mouth daily.   ezetimibe 10 MG tablet Commonly known as: ZETIA TAKE 1 TABLET DAILY   famotidine 20 MG tablet Commonly known as: PEPCID Take 1 tablet (20 mg total) by mouth daily.   finasteride 5 MG tablet Commonly known as: PROSCAR Take 1 tablet (5 mg total) by mouth daily.   fluticasone 50 MCG/ACT nasal spray Commonly known as: FLONASE Place 2 sprays into both nostrils daily.   levothyroxine 75 MCG tablet Commonly known as: SYNTHROID TAKE 1 TABLET DAILY   liothyronine 5 MCG tablet Commonly known as: CYTOMEL TAKE 2 TABLETS DAILY   metoprolol succinate 25 MG 24 hr tablet Commonly known as: TOPROL-XL Take 0.5 tablets (12.5 mg total) by mouth daily.   MULTIVITAMIN ADULT EXTRA C PO Take 1 tablet by mouth daily.   nitroGLYCERIN 0.4 MG SL tablet Commonly known as:  NITROSTAT Place 1 tablet (0.4 mg total) under the tongue every 5 (five) minutes as needed for chest pain.   PARoxetine 10 MG tablet Commonly known as: Paxil Take 1 tablet (10 mg total) by mouth daily.   Semaglutide (2 MG/DOSE) 8 MG/3ML Sopn Inject 2 mg as directed once a week. Sunday   Vitamin D3 250 MCG (10000 UT) capsule Take 10,000 Units by mouth daily.        Exam BP 134/80   Pulse 73   Temp 97.8 F (36.6 C) (Oral)   Resp 16   Ht 5\' 7"  (1.702 m)   Wt 199 lb 9.6 oz (90.5 kg)   SpO2 97%   BMI 31.26 kg/m  General:  well developed, well nourished, in no apparent distress Lungs:  No respiratory distress Psych: well oriented with normal  range of affect and age-appropriate judgement/insight, alert and oriented x4.  Assessment and Plan  Mild episode of recurrent major depressive disorder (HCC)  GAD (generalized anxiety disorder)  1/2. Chronic, not controlled.  I do not believe he has given the Paxil enough time.  He will continue the 10 mg daily for another several weeks.  He will send me a message at the end of January.  If no improvement we will change him to [employer].  Referral to the psychiatry team was placed today.  Self-referral resources were provided as well.  Counseling contact information was provided in his paperwork as well.  Counseled on exercise. 3.  Continue Flonase and oral antihistamine.  Add intranasal Atrovent.  If no improvement, will refer to allergy. The patient voiced understanding and agreement to the plan.  I spent 42 minutes with the patient discussing the above plans in addition to reviewing her chart on the same day the visit.  Jilda Roche Burley, DO 01/10/23 9:07 AM

## 2023-01-10 NOTE — Patient Instructions (Addendum)
Send me a message in 3 weeks in mid to late Jan if we still aren't improving. At that point, we will stop the Paxil and transition you to a different therapy.  Aim to do some physical exertion for 150 minutes per week. This is typically divided into 5 days per week, 30 minutes per day. The activity should be enough to get your heart rate up. Anything is better than nothing if you have time constraints.  Please consider counseling. Contact [phone] to schedule an appointment or inquire about cost/insurance coverage.  [facility] located at [address], [address], [place], [place].  Phone number = [phone].  Dr. Regan Lemming - Adult Psychiatry.    [facility] located at [address] [place], [place], [place]. Phone number = [phone].   [facility] located at [address] [place], [place].  Phone number = [phone].   [facility] located at [address] [place], [place], [place].  Phone number = [phone].  Let us know if you need anything.  [facility] [address] [place], [place] [address] [phone]  [facility] [address] [place], [place] [address] [phone]  [facility] [address] [place], [place] [address] [phone]  [facility] [address], [place], [place], [phone] [address], [place], [place], #[phone]  [facility] [address] [place], [place] [phone]  [facility] [address] [place], [place] [phone]  [facility] [address] [place], [place]   [phone].   Please go online to complete a form to submit first.  [facility]  [address] [place], [place] [phone].     [facility] -  local practices located at: [address], [place], [place]. [phone]. [address] [place], [place], [place].  [phone]. [address], [place], [place].  [phone].  Contact one of these offices sooner than later as it can take 2-3 months to get a new patient appointment.

## 2023-01-10 NOTE — Addendum Note (Signed)
Addended by: Radene Gunning on: 01/10/2023 10:30 AM   Modules accepted: Level of Service

## 2023-01-22 ENCOUNTER — Telehealth: Payer: Self-pay

## 2023-01-22 ENCOUNTER — Ambulatory Visit: Payer: Self-pay | Admitting: General Surgery

## 2023-01-22 NOTE — Telephone Encounter (Signed)
 Please advise holding Eliquis prior to lipoma excision.  Thank you!  DW

## 2023-01-22 NOTE — Telephone Encounter (Signed)
 Dr. Santo,   You saw this patient on 01/03/2023. Per office protocol, will you please comment on medical clearance for excision of lipoma?  Please route your response to P CV DIV Preop. I will communicate with requesting office once you have given recommendations.   Thank you!  Barnie Hila, NP

## 2023-01-22 NOTE — H&P (Signed)
 Chief Complaint: New Consultation ( Lipoma of right upper extremity)       History of Present Illness: Raymond Christensen is a 83 y.o. male who is seen today as an office consultation at the request of Dr. Frann for evaluation of New Consultation ( Lipoma of right upper extremity) .   Patient is an 83 year old male who comes in with a lengthy cardiac history, on Eliquis , who has a right shoulder and right forearm lipoma.   Patient states that this has been there for significant mount time.  He states that is gotten somewhat larger.  He is not having pain to that area but would like to have removed.   Patient recently underwent a TURP under general anesthesia.           Review of Systems: A complete review of systems was obtained from the patient.  I have reviewed this information and discussed as appropriate with the patient.  See HPI as well for other ROS.   Review of Systems  Constitutional:  Negative for fever.  HENT:  Negative for congestion.   Eyes:  Negative for blurred vision.  Respiratory:  Negative for cough, shortness of breath and wheezing.   Cardiovascular:  Negative for chest pain and palpitations.  Gastrointestinal:  Negative for heartburn.  Genitourinary:  Negative for dysuria.  Musculoskeletal:  Negative for myalgias.  Skin:  Negative for rash.  Neurological:  Negative for dizziness and headaches.  Psychiatric/Behavioral:  Negative for depression and suicidal ideas.   All other systems reviewed and are negative.       Medical History: Past Medical History Past Medical History: Diagnosis Date  Arthritis    Coronary artery disease    Depression    Hyperlipidemia    Hypertension    Hypothyroidism    Myocardial infarction (CMS/HHS-HCC)    Obstructive sleep apnea    Postoperative atrial fibrillation (CMS/HHS-HCC)        Problem List Patient Active Problem List Diagnosis  CAD (coronary artery disease)  HTN (hypertension)  Obesity (BMI 35.0-39.9  without comorbidity), unspecified  OSA on CPAP  Benign enlargement of prostate  Benign prostatic hyperplasia without lower urinary tract symptoms  Concussion  Depressive disorder  Major depressive disorder, single episode  Elevated prostate specific antigen (PSA)  Presence of artificial hip joint  Hypothyroidism  Routine general medical examination at a health care facility  Dyslipidemia, unspecified  Osteoarthrosis  Vitamin D  deficiency  History of repair of hip joint  Left carotid artery stenosis  Postoperative atrial fibrillation , unspecified (CMS-HCC)  H/O syncope  Subclavian artery stenosis, right (CMS-HCC)  Nonrheumatic mitral valve regurgitation      Past Surgical History Past Surgical History: Procedure Laterality Date  coronary atherectomy   1994  R.femur fx plate   8005  CARDIAC SURGERY   2001   CABGx4  CORONARY ARTERY BYPASS GRAFT   2001  THROMBOENDARTERECTOMY ARTERY CAROTID BY NECK INCISION Left 01/17/2014   Procedure: THROMBOENDARTERECTOMY ARTERY CAROTID BY NECK INCISION;  Surgeon: Merilee Lemond Free, MD;  Location: [facility];  Service: General Surgery;  Laterality: Left;  CAROTID ENDARTERECTOMY      JOINT REPLACEMENT Bilateral     THA      Allergies Allergies Allergen Reactions  Beta-Blockers (Beta-Adrenergic Blocking Agts) Other (See Comments)     Caused Fatigue  Sulfa (Sulfonamide Antibiotics) Rash     Questionable, he developed a diffuse rash 2 days after stopping Bactrim      Medications Ordered Prior to Encounter Current Outpatient  Medications on File Prior to Visit Medication Sig Dispense Refill  apixaban  (ELIQUIS ) 5 mg tablet Take 5 mg by mouth every 12 (twelve) hours      atorvastatin  (LIPITOR ) 80 MG tablet Take 1 tablet by mouth once daily      buPROPion  (WELLBUTRIN  XL) 150 MG XL tablet Take 1 tablet by mouth once daily      CHOLECALCIFEROL , VITAMIN D3, (VITAMIN D3 ORAL) Take 10,000 Units by mouth once daily      ezetimibe  (ZETIA ) 10  mg tablet TAKE 1 TABLET DAILY      FAMOTIDINE  ORAL Take 20 mg by mouth once daily      finasteride  (PROSCAR ) 5 mg tablet TAKE 1 TABLET DAILY      levothyroxine  (SYNTHROID , LEVOTHROID) 75 MCG tablet Take 75 mcg by mouth once daily Take on an empty stomach with a glass of water at least 30-60 minutes before breakfast.      liothyronine  (CYTOMEL ) 5 MCG tablet Take 10 mcg by mouth once daily.         metoprolol  succinate (TOPROL -XL) 25 MG XL tablet Take 12.5 mg by mouth once daily      multivitamin (MULTI-DAY ORAL) Take by mouth      nitroGLYcerin  (NITROSTAT ) 0.4 MG SL tablet PLACE 1 TABLET (0.4 MG TOTAL) UNDER THE TONGUE EVERY FIVE MINUTES AS NEEDED FOR CHEST PAIN.      OZEMPIC  1 mg/dose (4 mg/3 mL) pen injector        sertraline  (ZOLOFT ) 100 MG tablet Take 50 mg by mouth once daily      spironolactone  (ALDACTONE ) 25 MG tablet Take 0.5 tablets by mouth once daily      aspirin  81 MG EC tablet Take 81 mg by mouth once daily      busPIRone  (BUSPAR ) 7.5 MG tablet  (Patient not taking: Reported on 10/24/2020)      carvediloL  (COREG ) 6.25 MG tablet Take 6.25 mg by mouth 2 (two) times daily with meals (Patient not taking: Reported on 10/24/2020)      clopidogreL  (PLAVIX ) 75 mg tablet Take 75 mg by mouth once daily (Patient not taking: Reported on 10/24/2020)      ENTRESTO  24-26 mg tablet Take 1 tablet by mouth 2 (two) times daily (Patient not taking: Reported on 10/24/2020)      ferrous sulfate 325 (65 FE) MG tablet Take by mouth      fluorouraciL (EFUDEX) 5 % cream APPLY TO FACE AND SCALP FOR 2 WEEKS. (Patient not taking: Reported on 10/24/2020)      FUROsemide  (LASIX ) 40 MG tablet Take 40 mg by mouth once daily      levocetirizine (XYZAL ) 5 MG tablet Take 1 tablet by mouth every evening      OXcarbazepine (TRILEPTAL) 150 MG tablet 150 mg      potassium chloride  (KLOR-CON ) 10 MEQ ER tablet        tamsulosin  (FLOMAX ) 0.4 mg capsule        vibegron (GEMTESA) 75 mg Tab Take 1 tablet by mouth every other  day      zinc 50 mg Tab Take 50 mg by mouth once daily.        No current facility-administered medications on file prior to visit.      Family History Family History Problem Relation Age of Onset  Stroke Mother 98  Glaucoma Mother    Macular degeneration Mother    High blood pressure (Hypertension) Mother    Dementia Father    Coronary Artery Disease (Blocked  arteries around heart) Father 68       CABG  No Known Problems Sister    Anesthesia problems Neg Hx        Tobacco Use History Social History    Tobacco Use Smoking Status Former  Current packs/day: 0.00  Average packs/day: 1 pack/day for 20.0 years (20.0 ttl pk-yrs)  Types: Cigarettes  Start date: 10/24/1959  Quit date: 10/24/1979  Years since quitting: 43.2 Smokeless Tobacco Never      Social History Social History    Socioeconomic History  Marital status: Married Occupational History  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 Tobacco Use  Smoking status: Former     Current packs/day: 0.00     Average packs/day: 1 pack/day for 20.0 years (20.0 ttl pk-yrs)     Types: Cigarettes     Start date: 10/24/1959     Quit date: 10/24/1979     Years since quitting: 43.2  Smokeless tobacco: Never Vaping Use  Vaping status: Never Used Substance and Sexual Activity  Alcohol use: Yes     Alcohol/week: 0.0 standard drinks of alcohol     Types: 3 Glasses of wine, 2 Cans of beer per week     Comment: occasional  Drug use: No  Sexual activity: Not Currently     Partners: Female     Birth control/protection: Post-menopausal, Surgical Social History Narrative   From [place] . Moved to [place] in 2015.. Lives at home w/ wife.     Social Drivers of Manufacturing Engineer Strain: Low Risk  (07/31/2022)   Received from [facility]   Overall Financial Resource Strain (CARDIA)    Difficulty of Paying Living Expenses: Not hard at all Food Insecurity: No Food Insecurity (06/26/2022)    Received from [facility]   Hunger Vital Sign    Worried About Running Out of Food in the Last Year: Never true    Ran Out of Food in the Last Year: Never true Transportation Needs: No Transportation Needs (06/26/2022)   Received from [facility] - Transportation    Lack of Transportation (Medical): No    Lack of Transportation (Non-Medical): No Physical Activity: Inactive (07/31/2022)   Received from [facility]   Exercise Vital Sign    Days of Exercise per Week: 0 days    Minutes of Exercise per Session: 0 min Stress: No Stress Concern Present (07/31/2022)   Received from [facility] - Occupational Stress Questionnaire    Feeling of Stress : Not at all Social Connections: Socially Integrated (07/31/2022)   Received from [facility]   Social Connection and Isolation Panel [NHANES]    Frequency of Communication with Friends and Family: Three times a week    Frequency of Social Gatherings with Friends and Family: Once a week    Attends Religious Services: More than 4 times per year    Active Member of [employer] or Organizations: Yes    Attends Banker Meetings: More than 4 times per year    Marital Status: Married Housing Stability: Unknown (01/22/2023)   Housing Stability Vital Sign    Homeless in the Last Year: No      Objective:     Vitals:   01/22/23 0949 BP: (!) 147/82 Pulse: 66 Temp: 36.4 C (97.5 F) SpO2: 98% Weight: 92.2 kg (203 lb 3.2 oz) Height: 170.2 cm (5' 7) PainSc: 0-No pain   Body mass  index is 31.83 kg/m.   Physical Exam Constitutional:      General: He is not in acute distress.    Appearance: Normal appearance.  HENT:     Head: Normocephalic.     Nose: No rhinorrhea.     Mouth/Throat:     Mouth: Mucous membranes are moist.     Pharynx: Oropharynx is clear.  Eyes:     General: No scleral icterus.    Pupils: Pupils are equal, round, and reactive to light.  Cardiovascular:     Rate  and Rhythm: Normal rate.     Pulses: Normal pulses.  Pulmonary:     Effort: Pulmonary effort is normal. No respiratory distress.     Breath sounds: No stridor. No wheezing.  Abdominal:     General: Abdomen is flat. There is no distension.     Tenderness: There is no abdominal tenderness. There is no guarding or rebound.  Musculoskeletal:        General: Normal range of motion.     Cervical back: Normal range of motion and neck supple.  Skin:    General: Skin is warm and dry.     Capillary Refill: Capillary refill takes less than 2 seconds.     Coloration: Skin is not jaundiced.           Comments: R upper 4x3cm, R forearm 1cm  Neurological:     General: No focal deficit present.     Mental Status: He is alert and oriented to person, place, and time. Mental status is at baseline.  Psychiatric:        Mood and Affect: Mood normal.        Thought Content: Thought content normal.        Judgment: Judgment normal.          Assessment and Plan: Diagnoses and all orders for this visit:   Lipoma of shoulder     Raymond Christensen is a 83 y.o. male    1. We will obtain cardiac clearance to be sure he can be off his Eliquis  prior to surgery. 2. 2.  We will proceed to the operating for excision of right shoulder and right forearm lipoma. 3. 3.  Discussed with him the risk and benefits of the procedure to include but not to: Infection, bleeding, possible damage to surrounding structures, possible need for further surgery, possible recurrence.  Patient voiced understanding and wishes to proceed.           No follow-ups on file.   Lynda Leos, MD, [facility], [place] [facility]

## 2023-01-22 NOTE — Telephone Encounter (Signed)
   Pre-operative Risk Assessment    Patient Name: Raymond Christensen  DOB: 1940/05/05 MRN: [id number]   Date of last office visit: 01/03/23 Date of next office visit: 02/07/23   Request for Surgical Clearance    Procedure:   E/O Lipoma  Date of Surgery:  Clearance TBD                                Surgeon:  Lynda Leos, MD Surgeon's Group or Practice Name:  [facility] Phone number:  [phone] Fax number:  [phone]   Type of Clearance Requested:   - Medical  - Pharmacy:  Hold Apixaban  (Eliquis )     Type of Anesthesia:  General    Additional requests/questions:    [name]   01/22/2023, 10:26 AM

## 2023-01-23 NOTE — Telephone Encounter (Signed)
 Spoke with patient via telephone. He understands if he goes forward with lipoma removal he may delay scheduling of watchman procedure. He is not sure when the lipoma removal will be scheduled and will make the decision about proceeding when he finds out.   He is doing well since his last visit with Dr. Santo on 01/03/2023. He denies chest pain or shortness of breath. He has returned to his normal home activities.   Will await recommendations from pharmacy.   Barnie HERO. Matalie Romberger, DNP, NP-C  01/23/2023, 11:30 AM [facility] [address] Office [phone] Fax [phone]

## 2023-01-25 NOTE — Telephone Encounter (Signed)
 Patient with diagnosis of atrial fibrillation on Eliquis  for anticoagulation.    Procedure:   E/O Lipoma   Date of Surgery:  Clearance TBD      CHA2DS2-VASc Score = 6   This indicates a 9.7% annual risk of stroke. The patient's score is based upon: CHF History: 1 HTN History: 1 Diabetes History: 1 Stroke History: 0 Vascular Disease History: 1 Age Score: 2 Gender Score: 0  CrCl 82 Platelet count 153  Per office protocol, patient can hold Eliquis  for 2 days prior to procedure.   Patient will not need bridging with Lovenox (enoxaparin) around procedure.  **This guidance is not considered finalized until pre-operative APP has relayed final recommendations.**

## 2023-01-27 NOTE — Telephone Encounter (Signed)
   Patient Name: Raymond Christensen  DOB: [birth year] MRN: [id number]  Primary Cardiologist: Stanly DELENA Leavens, MD  Chart reviewed as part of pre-operative protocol coverage. Given past medical history and time since last visit, based on ACC/AHA guidelines, Raymond Christensen is at acceptable risk for the planned procedure without further cardiovascular testing.    Per Dr. Lorrane:   Dr. Gracelyn from urology was to clear him to resume anticoagulation (see prior note). It sounds like from your message he was cleared and is back on anticoagulation. He has high risks of bleeding and stroke and was having bleeding issues when I last saw him; it is reasonable for him to stop Raymond Christensen peri-operatively; his risk of bleeding makes bridge not-ideal. Sees Dr. Kennyth later this month for discussion of Watchman He should know this may delay that procedure until he can return to Raymond Christensen.  Stanly Leavens, MD FASE [facility] Cardiologist [facility] [address] [place], [address] [place], [place] [address] [phone] 4:11 PM    CHA2DS2-VASc Score = 6   This indicates a 9.7% annual risk of stroke. The patient's score is based upon: CHF History: 1 HTN History: 1 Diabetes History: 1 Stroke History: 0 Vascular Disease History: 1 Age Score: 2 Gender Score: 0   CrCl 82 Platelet count 153   Per office protocol, patient can hold Eliquis  for 2 days prior to procedure.   Patient will not need bridging with Lovenox (enoxaparin) around procedure.  The patient was advised that if he develops new symptoms prior to surgery to contact our office to arrange for a follow-up visit, and he verbalized understanding.  I will route this recommendation to the requesting party via Epic fax function and remove from pre-op pool.  Please call with questions.  Raymond Satterfield, NP 01/27/2023, 8:33 AM

## 2023-02-07 ENCOUNTER — Ambulatory Visit: Payer: Medicare (Managed Care) | Admitting: Cardiology

## 2023-02-12 ENCOUNTER — Other Ambulatory Visit: Payer: Self-pay | Admitting: *Deleted

## 2023-02-12 DIAGNOSIS — I4819 Other persistent atrial fibrillation: Secondary | ICD-10-CM

## 2023-02-12 MED ORDER — APIXABAN 5 MG PO TABS: 5.0000 mg | ORAL_TABLET | Freq: Two times a day (BID) | ORAL | 1 refills | Status: DC

## 2023-02-12 NOTE — Telephone Encounter (Signed)
Eliquis 5mg  refill request received. Patient is 83 years old, weight-90.5kg, Crea-0.90 on 12/18/22, Diagnosis-Afib, and last seen by Dr. Izora Ribas on 01/03/23. Dose is appropriate based on dosing criteria. Will send in refill to requested pharmacy.

## 2023-02-12 NOTE — Progress Notes (Unsigned)
Electrophysiology Office Note:   Date:  02/13/2023  ID:  Raymond Christensen, DOB [birth year], MRN [id number]  Primary Cardiologist: Raymond Constant, MD Electrophysiologist: Raymond Lemming, MD      History of Present Illness:   Raymond Christensen is a 83 y.o. male with h/o CAD and prior graft failure in 2024, MR s/p MitraClip, bleeding after TURP and PAF who is being seen today for evaluation for Watchman implant at the request of Raymond Christensen.  Discussed the use of AI scribe software for clinical note transcription with the patient, who gave verbal consent to proceed.  History of Present Illness   The patient, with a history of atrial fibrillation (AFib), presents for a discussion about the potential use of a Watchman device. Patient had significant bleeding following a transurethral resection of the prostate (TURP) in December of 2024, leading to a request from his urologist to halt anticoagulation and aspirin therapy for re-evaluation. Bleeding resolved and he has since resumed his Eliquis and is tolerating without any ongoing bleeding issues. The patient states there are no future urologic interventions planned. The patient does not report any recent episodes of AFib, but also mentions not being able to tell when he goes into AFib. The patient's watch, which can detect AFib, has not been checked recently.     Review of systems complete and found to be negative unless listed in HPI.   EP Information / Studies Reviewed:    EKG is not ordered today. EKG from 01/03/23 reviewed which showed sinus rhythm with RBBB.     EKG 09/09/19: AF   Echo 09/23/22:  1. Apical window is foreshortened, making accurate assessment of LVEF  difficult . Left ventricular ejection fraction, by estimation, is 45 to  50%. The left ventricle has mildly decreased function. The left ventricle  demonstrates global hypokinesis.  There is mild concentric left ventricular hypertrophy. Left ventricular  diastolic  parameters are consistent with Grade II diastolic dysfunction  (pseudonormalization). Elevated left atrial pressure.   2. Right ventricular systolic function is mildly reduced. The right  ventricular size is mildly enlarged.   3. Left atrial size was severely dilated.   4. Right atrial size was moderately dilated.   5. S/p MtriaClip (one XTW; procedure date 12/23/19). MR is very eccentric,  directed posterior into LA Appears moderate. More prominent when compared  to echo from 2022.. The mitral valve has been repaired/replaced. Moderate  mitral valve regurgitation.   6. The aortic valve is tricuspid. Aortic valve regurgitation is mild.   Risk Assessment/Calculations:    CHA2DS2-VASc Score = 6   This indicates a 9.7% annual risk of stroke. The patient's score is based upon: CHF History: 1 HTN History: 1 Diabetes History: 1 Stroke History: 0 Vascular Disease History: 1 Age Score: 2 Gender Score: 0     Physical Exam:   VS:  BP (!) 142/80   Pulse 70   Ht 5\' 7"  (1.702 m)   Wt 203 lb (92.1 kg)   SpO2 97%   BMI 31.79 kg/m    Wt Readings from Last 3 Encounters:  02/13/23 203 lb (92.1 kg)  01/10/23 199 lb 9.6 oz (90.5 kg)  01/03/23 198 lb 12.8 oz (90.2 kg)     GEN: Well nourished, well developed in no acute distress NECK: No JVD CARDIAC: Normal rate and regular rhythm RESPIRATORY:  Clear to auscultation without rales, wheezing or rhonchi  ABDOMEN: Soft, non-distended EXTREMITIES:  No edema; No deformity   ASSESSMENT AND PLAN:  I have seen Raymond Christensen in the office today who is being considered for a Watchman left atrial appendage closure device. I believe they will benefit from this procedure given their history of atrial fibrillation, CHA2DS2-VASc score of 6 and unadjusted ischemic stroke rate of 9.7% per year. Unfortunately, the patient is not felt to be a long term anticoagulation candidate secondary to hematuria. The patient's chart has been reviewed and I feel that  they would be a candidate for short term oral anticoagulation after Watchman implant.   It is my belief that after undergoing a LAA closure procedure, Raymond Christensen will not need long term anticoagulation which eliminates anticoagulation side effects and major bleeding risk.   Procedural risks for the Watchman implant have been reviewed with the patient including a 0.5% risk of stroke, <1% risk of perforation and <1% risk of device embolization. Other risks include bleeding, vascular damage, tamponade, worsening renal function, and death. The patient understands these risk and wishes to proceed.     The published clinical data on the safety and effectiveness of WATCHMAN include but are not limited to the following: - Holmes DR, Everlene Farrier, Sick P et al. for the PROTECT AF Investigators. Percutaneous closure of the left atrial appendage versus warfarin therapy for prevention of stroke in patients with atrial fibrillation: a randomised non-inferiority trial. Lancet 2009; 374: 534-42. Everlene Farrier, Doshi SK, Isa Rankin D et al. on behalf of the PROTECT AF Investigators. Percutaneous Left Atrial Appendage Closure for Stroke Prophylaxis in Patients With Atrial Fibrillation 2.3-Year Follow-up of the PROTECT AF (Watchman Left Atrial Appendage System for Embolic Protection in Patients With Atrial Fibrillation) Trial. Circulation 2013; 127:720-729. - Alli O, Doshi S,  Kar S, Reddy VY, Sievert H et al. Quality of Life Assessment in the Randomized PROTECT AF (Percutaneous Closure of the Left Atrial Appendage Versus Warfarin Therapy for Prevention of Stroke in Patients With Atrial Fibrillation) Trial of Patients at Risk for Stroke With Nonvalvular Atrial Fibrillation. J Am Coll Cardiol 2013; 61:1790-8. Aline August DR, Mia Creek, Price M, Whisenant B, Sievert H, Doshi S, Huber K, Reddy V. Prospective randomized evaluation of the Watchman left atrial appendage Device in patients with atrial fibrillation versus long-term  warfarin therapy; the PREVAIL trial. Journal of the [employer] of Cardiology, Vol. 4, No. 1, 2014, 1-11. - Kar S, Doshi SK, Sadhu A, Horton R, Osorio J et al. Primary outcome evaluation of a next-generation left atrial appendage closure device: results from the PINNACLE FLX trial. Circulation 2021;143(18)1754-1762.   HAS-BLED score: 3 Hypertension Yes  Abnormal renal and liver function (Dialysis, transplant, Cr >2.26 mg/dL /Cirrhosis or Bilirubin >2x Normal or AST/ALT/AP >3x Normal) No  Stroke No  Bleeding Yes  Labile INR (Unstable/high INR) No  Elderly (>65) Yes  Drugs or alcohol (>= 8 drinks/week, anti-plt or NSAID) No   CHA2DS2-VASc Score = 6  The patient's score is based upon: CHF History: 1 HTN History: 1 Diabetes History: 1 Stroke History: 0 Vascular Disease History: 1 Age Score: 2 Gender Score: 0       ASSESSMENT AND PLAN: Paroxysmal Atrial Fibrillation (ICD10:  I48.0) The patient's CHA2DS2-VASc score is 6, indicating a 9.7% annual risk of stroke.   Order a 2 week Zio monitor to assess his AF burden.   Secondary Hypercoagulable State (ICD10:  D68.69){ The patient is at significant risk for stroke/thromboembolism based upon his CHA2DS2-VASc Score of 6.  He is currently tolerating Eliqius without issue. Hematuria has resolved.    Patient  is not interested in pursuing Watchman device implant at this time. He would like to continue on Eliquis. If he has recurrent hematuria then we will readdress.   Signed,  Nobie Putnam, MD    02/13/2023 8:58 PM

## 2023-02-13 ENCOUNTER — Ambulatory Visit: Payer: Medicare Other | Attending: Cardiology | Admitting: Cardiology

## 2023-02-13 ENCOUNTER — Ambulatory Visit: Payer: Medicare Other | Attending: Cardiology

## 2023-02-13 ENCOUNTER — Other Ambulatory Visit: Payer: Self-pay

## 2023-02-13 ENCOUNTER — Encounter: Payer: Self-pay | Admitting: Cardiology

## 2023-02-13 ENCOUNTER — Other Ambulatory Visit: Payer: Self-pay | Admitting: Family Medicine

## 2023-02-13 VITALS — BP 142/80 | HR 70 | Ht 67.0 in | Wt 203.0 lb

## 2023-02-13 DIAGNOSIS — F411 Generalized anxiety disorder: Secondary | ICD-10-CM

## 2023-02-13 DIAGNOSIS — I480 Paroxysmal atrial fibrillation: Secondary | ICD-10-CM | POA: Diagnosis not present

## 2023-02-13 DIAGNOSIS — I493 Ventricular premature depolarization: Secondary | ICD-10-CM

## 2023-02-13 DIAGNOSIS — D6869 Other thrombophilia: Secondary | ICD-10-CM

## 2023-02-13 MED ORDER — EZETIMIBE 10 MG PO TABS: 10.0000 mg | ORAL_TABLET | Freq: Every day | ORAL | 3 refills | Status: DC

## 2023-02-13 MED ORDER — ATORVASTATIN CALCIUM 80 MG PO TABS: 80.0000 mg | ORAL_TABLET | Freq: Every day | ORAL | 3 refills | Status: DC

## 2023-02-13 NOTE — Progress Notes (Unsigned)
Enrolled patient for a 14 day Zio XT  monitor to be mailed to patients home

## 2023-02-13 NOTE — Patient Instructions (Addendum)
Medication Instructions:  Your physician recommends that you continue on your current medications as directed. Please refer to the Current Medication list given to you today.  *If you need a refill on your cardiac medications before your next appointment, please call your pharmacy*  Testing/Procedures: Event Monitor Your physician has recommended that you wear an event monitor. Event monitors are medical devices that record the heart's electrical activity. Doctors most often [place] these monitors to diagnose arrhythmias. Arrhythmias are problems with the speed or rhythm of the heartbeat. The monitor is a small, portable device. You can wear one while you do your normal daily activities. This is usually used to diagnose what is causing palpitations/syncope (passing out).  Follow-Up: At [facility], you and your health needs are our priority.  As part of our continuing mission to provide you with exceptional heart care, we have created designated Provider Care Teams.  These Care Teams include your primary Cardiologist (physician) and Advanced Practice Providers (APPs -  Physician Assistants and Nurse Practitioners) who all work together to provide you with the care you need, when you need it.  2 months with an EP APP  ZIO XT- Long Term Monitor Instructions  Your physician has requested you wear a ZIO patch monitor for 14 days.  This is a single patch monitor. [employer] supplies one patch monitor per enrollment. Additional stickers are not available. Please do not apply patch if you will be having a Nuclear Stress Test,  Echocardiogram, Cardiac CT, MRI, or Chest Xray during the period you would be wearing the  monitor. The patch cannot be worn during these tests. You cannot remove and re-apply the  ZIO XT patch monitor.  Your ZIO patch monitor will be mailed 3 day USPS to your address on file. It may take 3-5 days  to receive your monitor after you have been enrolled.  Once you have received  your monitor, please review the enclosed instructions. Your monitor  has already been registered assigning a specific monitor serial # to you.  Billing and Patient Assistance Program Information  We have supplied [employer] with any of your insurance information on file for billing purposes. [employer] offers a sliding scale Patient Assistance Program for patients that do not have  insurance, or whose insurance does not completely cover the cost of the ZIO monitor.  You must apply for the Patient Assistance Program to qualify for this discounted rate.  To apply, please call [employer] at [phone], select option 4, select option 2, ask to apply for  Patient Assistance Program. Meredeth Ide will ask your household income, and how many people  are in your household. They will quote your out-of-pocket cost based on that information.  [employer] will also be able to set up a 9-month, interest-free payment plan if needed.  Applying the monitor   Shave hair from upper left chest.  Hold abrader disc by orange tab. Rub abrader in 40 strokes over the upper left chest as  indicated in your monitor instructions.  Clean area with 4 enclosed alcohol pads. Let dry.  Apply patch as indicated in monitor instructions. Patch will be placed under collarbone on left  side of chest with arrow pointing upward.  Rub patch adhesive wings for 2 minutes. Remove white label marked "1". Remove the white  label marked "2". Rub patch adhesive wings for 2 additional minutes.  While looking in a mirror, press and release button in center of patch. A small green light will  flash 3-4 times. This  will be your only indicator that the monitor has been turned on.  Do not shower for the first 24 hours. You may shower after the first 24 hours.  Press the button if you feel a symptom. You will hear a small click. Record Date, Time and  Symptom in the Patient Logbook.  When you are ready to remove the patch, follow instructions on the last  2 pages of Patient  Logbook. Stick patch monitor onto the last page of Patient Logbook.  Place Patient Logbook in the blue and white box. Use locking tab on box and tape box closed  securely. The blue and white box has prepaid postage on it. Please place it in the mailbox as  soon as possible. Your physician should have your test results approximately 7 days after the  monitor has been mailed back to [facility].  Call [facility] Customer Care at [phone] if you have questions regarding  your ZIO XT patch monitor. Call them immediately if you see an orange light blinking on your  monitor.  If your monitor falls off in less than 4 days, contact our Monitor department at [phone].  If your monitor becomes loose or falls off after 4 days call [employer] at [phone] for  suggestions on securing your monitor

## 2023-02-19 DIAGNOSIS — I480 Paroxysmal atrial fibrillation: Secondary | ICD-10-CM

## 2023-02-19 DIAGNOSIS — I493 Ventricular premature depolarization: Secondary | ICD-10-CM | POA: Diagnosis not present

## 2023-03-03 ENCOUNTER — Other Ambulatory Visit: Payer: Self-pay | Admitting: Family Medicine

## 2023-03-03 MED ORDER — LIOTHYRONINE SODIUM 5 MCG PO TABS
10.0000 ug | ORAL_TABLET | Freq: Every day | ORAL | 1 refills | Status: DC
Start: 2023-03-03 — End: 2023-08-27

## 2023-03-03 NOTE — Telephone Encounter (Signed)
Copied from CRM [id number]. Topic: Clinical - Medication Refill >> Mar 03, 2023 10:25 AM Adele Barthel wrote: Most Recent Primary Care Visit:  Provider: Sharlene Dory  Department: [facility]  Visit Type: OFFICE VISIT  Date: 01/10/2023  Medication: liothyronine (CYTOMEL) 5 MCG tablet   Has the patient contacted their pharmacy? Yes (Agent: If no, request that the patient contact the pharmacy for the refill. If patient does not wish to contact the pharmacy document the reason why and proceed with request.) (Agent: If yes, when and what did the pharmacy advise?)  Is this the correct pharmacy for this prescription? Yes If no, delete pharmacy and type the correct one.  This is the patient's preferred pharmacy:   [facility] [id number] [facility], [place] - [address] [place] [address] Phone: [phone] Fax: [phone]    Has the prescription been filled recently? Yes  Is the patient out of the medication? No  Has the patient been seen for an appointment in the last year OR does the patient have an upcoming appointment? Yes  Can we respond through MyChart? Yes  Agent: Please be advised that Rx refills may take up to 3 business days. We ask that you follow-up with your pharmacy.

## 2023-03-10 ENCOUNTER — Ambulatory Visit (HOSPITAL_COMMUNITY): Admit: 2023-03-10 | Payer: Medicare Other | Admitting: General Surgery

## 2023-03-10 SURGERY — EXCISION LIPOMA
Anesthesia: General | Laterality: Right

## 2023-03-17 ENCOUNTER — Telehealth: Payer: Self-pay | Admitting: Family Medicine

## 2023-03-17 NOTE — Telephone Encounter (Signed)
 Pt has questions for pcp that he plans to ask during his wifes appt this Friday.

## 2023-03-17 NOTE — Telephone Encounter (Signed)
 Spoke with patient and he will send a MyChart message for Dr. Arva Chafe.

## 2023-03-18 ENCOUNTER — Encounter: Payer: Self-pay | Admitting: Family Medicine

## 2023-03-27 ENCOUNTER — Encounter: Payer: Self-pay | Admitting: Family Medicine

## 2023-03-27 ENCOUNTER — Encounter: Payer: Self-pay | Admitting: Cardiology

## 2023-03-30 ENCOUNTER — Encounter: Payer: Self-pay | Admitting: Family Medicine

## 2023-03-31 ENCOUNTER — Other Ambulatory Visit: Payer: Self-pay | Admitting: Family Medicine

## 2023-03-31 DIAGNOSIS — E1169 Type 2 diabetes mellitus with other specified complication: Secondary | ICD-10-CM

## 2023-03-31 MED ORDER — SEMAGLUTIDE (2 MG/DOSE) 8 MG/3ML ~~LOC~~ SOPN: 2.0000 mg | PEN_INJECTOR | SUBCUTANEOUS | 1 refills | Status: DC

## 2023-04-01 ENCOUNTER — Telehealth: Payer: Self-pay

## 2023-04-01 ENCOUNTER — Other Ambulatory Visit (HOSPITAL_COMMUNITY): Payer: Self-pay

## 2023-04-01 NOTE — Telephone Encounter (Signed)
 Pharmacy Patient Advocate Encounter  Received notification from [facility] that Prior Authorization for Ozempic (2 MG/DOSE) 8MG /3ML pen-injectors has been APPROVED to 12.31.25. Ran test claim, Copay is $15.00. This test claim was processed through [facility]- copay amounts may vary at other pharmacies due to pharmacy/plan contracts, or as the patient moves through the different stages of their insurance plan.   PA #/Case ID/Reference #: (Key: [id number])

## 2023-04-01 NOTE — Telephone Encounter (Signed)
 Pharmacy Patient Advocate Encounter   Received notification from Physician's Office that prior authorization for Ozempic (2 MG/DOSE) 8MG /3ML pen-injectors is required/requested.   Insurance verification completed.   The patient is insured through [facility] .   Per test claim: PA required; PA submitted to above mentioned insurance via CoverMyMeds Key/confirmation #/EOC (Key: [id number])   Status is pending

## 2023-04-14 ENCOUNTER — Encounter: Payer: Self-pay | Admitting: Student

## 2023-04-14 ENCOUNTER — Ambulatory Visit: Payer: Medicare Other | Attending: Student | Admitting: Student

## 2023-04-14 VITALS — BP 122/74 | HR 74 | Resp 16 | Ht 67.0 in | Wt 198.2 lb

## 2023-04-14 DIAGNOSIS — I5022 Chronic systolic (congestive) heart failure: Secondary | ICD-10-CM | POA: Diagnosis not present

## 2023-04-14 DIAGNOSIS — I480 Paroxysmal atrial fibrillation: Secondary | ICD-10-CM

## 2023-04-14 DIAGNOSIS — I25110 Atherosclerotic heart disease of native coronary artery with unstable angina pectoris: Secondary | ICD-10-CM

## 2023-04-14 DIAGNOSIS — D6869 Other thrombophilia: Secondary | ICD-10-CM

## 2023-04-14 DIAGNOSIS — I493 Ventricular premature depolarization: Secondary | ICD-10-CM

## 2023-04-14 NOTE — Patient Instructions (Signed)
 Medication Instructions:  Your physician recommends that you continue on your current medications as directed. Please refer to the Current Medication list given to you today.  *If you need a refill on your cardiac medications before your next appointment, please call your pharmacy*  Lab Work: None ordered If you have labs (blood work) drawn today and your tests are completely normal, you will receive your results only by: MyChart Message (if you have MyChart) OR A paper copy in the mail If you have any lab test that is abnormal or we need to change your treatment, we will call you to review the results.  Follow-Up: At [facility], you and your health needs are our priority.  As part of our continuing mission to provide you with exceptional heart care, our providers are all part of one team.  This team includes your primary Cardiologist (physician) and Advanced Practice Providers or APPs (Physician Assistants and Nurse Practitioners) who all work together to provide you with the care you need, when you need it.  Your next appointment:   9 month(s)  Provider:   Loman Brooklyn, MD or [place]" Mardela [place], PA-C      1st Floor: - Lobby - Registration  - Pharmacy  - Lab - Cafe  2nd Floor: - PV Lab - Diagnostic Testing (echo, CT, nuclear med)  3rd Floor: - Vacant  4th Floor: - TCTS (cardiothoracic surgery) - AFib Clinic - Structural Heart Clinic - Vascular Surgery  - Vascular Ultrasound  5th Floor: - HeartCare Cardiology (general and EP) - Clinical Pharmacy for coumadin, hypertension, lipid, weight-loss medications, and med management appointments    Valet parking services will be available as well.

## 2023-04-14 NOTE — Progress Notes (Signed)
  Electrophysiology Office Note:   Date:  04/14/2023  ID:  Raymond Christensen, DOB [birth year], MRN [id number]  Primary Cardiologist: Christell Constant, MD Electrophysiologist: Regan Lemming, MD      History of Present Illness:   Raymond Christensen is a 83 y.o. male with h/o CAD and prior graft failure in 2024, MR s/p MitraClip, bleeding after TURP and PAF  seen today for routine electrophysiology followup.   Wore monitor that rare SVT (longest 15.6 seconds) and semi frequent PACs, none associated with symptoms.  Since last being seen in our clinic the patient reports doing well. He clarifies that hematuria was a chronic issue and has not recurred in some time. Not currently interested in watchman procedure. Otherwise,  he currently denies chest pain, palpitations, dyspnea, PND, orthopnea, nausea, vomiting, dizziness, syncope, edema, weight gain, or early satiety.   Review of systems complete and found to be negative unless listed in HPI.   EP Information / Studies Reviewed:    EKG is not ordered today. EKG from 01/03/2023 reviewed which showed NSR at 69 bpm       Arrhythmia/Device History No specialty comments available.   Physical Exam:   VS:  BP 122/74 (BP Location: Right Arm, Patient Position: Sitting, Cuff Size: Normal)   Pulse 74   Resp 16   Ht 5\' 7"  (1.702 m)   Wt 198 lb 3.2 oz (89.9 kg)   SpO2 97%   BMI 31.04 kg/m    Wt Readings from Last 3 Encounters:  04/14/23 198 lb 3.2 oz (89.9 kg)  02/13/23 203 lb (92.1 kg)  01/10/23 199 lb 9.6 oz (90.5 kg)     GEN: No acute distress NECK: No JVD; No carotid bruits CARDIAC: Regular rate and rhythm, no murmurs, rubs, gallops RESPIRATORY:  Clear to auscultation without rales, wheezing or rhonchi  ABDOMEN: Soft, non-tender, non-distended EXTREMITIES:  No edema; No deformity   ASSESSMENT AND PLAN:    PAF None noted on monitor Continue elquis 5 mg BID for CHA2DS2VASc of at least 6 Not interested in Watchman at this time.    Secondary hypercoagulable state Pt on Eliquis as above   PACs Brief SVT  Noted on monitor. Not clearly symptomatic Continue to monitor  Follow up with Dr. Elberta Fortis in 9 months, sooner with issues.   Signed, Graciella Freer, PA-C

## 2023-04-15 ENCOUNTER — Other Ambulatory Visit: Payer: Self-pay | Admitting: Family Medicine

## 2023-04-15 DIAGNOSIS — F411 Generalized anxiety disorder: Secondary | ICD-10-CM

## 2023-04-24 ENCOUNTER — Encounter: Payer: Self-pay | Admitting: Family Medicine

## 2023-04-30 ENCOUNTER — Other Ambulatory Visit: Payer: Self-pay | Admitting: Family Medicine

## 2023-04-30 ENCOUNTER — Ambulatory Visit

## 2023-04-30 ENCOUNTER — Other Ambulatory Visit: Payer: Self-pay

## 2023-04-30 ENCOUNTER — Other Ambulatory Visit (INDEPENDENT_AMBULATORY_CARE_PROVIDER_SITE_OTHER)

## 2023-04-30 ENCOUNTER — Telehealth: Payer: Self-pay | Admitting: Family Medicine

## 2023-04-30 DIAGNOSIS — Z111 Encounter for screening for respiratory tuberculosis: Secondary | ICD-10-CM

## 2023-04-30 MED ORDER — LEVOTHYROXINE SODIUM 75 MCG PO TABS: 75.0000 ug | ORAL_TABLET | Freq: Every day | ORAL | 1 refills | Status: DC

## 2023-04-30 MED ORDER — METOPROLOL SUCCINATE ER 25 MG PO TB24: 12.5000 mg | ORAL_TABLET | Freq: Every day | ORAL | 3 refills | Status: DC

## 2023-04-30 NOTE — Telephone Encounter (Signed)
 Copied from CRM [id number]. Topic: Clinical - Medication Refill >> Apr 30, 2023 12:27 PM Orien Bird wrote: Most Recent Primary Care Visit:  Provider: Jobe Mulder  Department: [facility]  Visit Type: OFFICE VISIT  Date: 01/10/2023  Medication: levothyroxine (SYNTHROID) 75 MCG tablet and metoprolol succinate (TOPROL-XL) 25 MG 24 hr tablet  Has the patient contacted their pharmacy? Yes (Agent: If no, request that the patient contact the pharmacy for the refill. If patient does not wish to contact the pharmacy document the reason why and proceed with request.) (Agent: If yes, when and what did the pharmacy advise?)  Is this the correct pharmacy for this prescription? Yes If no, delete pharmacy and type the correct one.  This is the patient's preferred pharmacy:   [facility] [place], [place] - [address] [place] [address] Phone: [phone] Fax: [phone]   Has the prescription been filled recently? No  Is the patient out of the medication? Yes  Has the patient been seen for an appointment in the last year OR does the patient have an upcoming appointment? Yes  Can we respond through MyChart? Yes  Agent: Please be advised that Rx refills may take up to 3 business days. We ask that you follow-up with your pharmacy.

## 2023-04-30 NOTE — Telephone Encounter (Signed)
 Paperwork placed I providers bin for review.

## 2023-04-30 NOTE — Progress Notes (Deleted)
 PPD Placement note Raymond Christensen, 83 y.o. male is here today for placement of PPD test Reason for PPD test: *** Pt taken PPD test before: {yes no:314489} Verified in allergy area and with patient that they are not allergic to the products PPD is made of (Phenol or Tween). {yes [id number]} Is patient taking any oral or IV steroid medication now or have they taken it in the last month? {yes [id number]} Has the patient ever received the BCG vaccine?: {yes no:314489} Has the patient been in recent contact with anyone known or suspected of having active TB disease?: {yes no:314489}      Date of exposure (if applicable): ***      Name of person they were exposed to (if applicable): *** Patient's Country of origin?: *** O: Alert and oriented in NAD. P:  PPD placed on 04/30/2023.  Patient advised to return for reading within 48-72 hours.

## 2023-04-30 NOTE — Telephone Encounter (Signed)
 Cain Castillo Harmon([phone]) dropped off paperwork to be completed by pcp for pt. Cain Castillo asks that she be called when documents are ready to be picked up so she can come get them. Forms placed in providers box.

## 2023-05-01 NOTE — Telephone Encounter (Signed)
 Copied from CRM [id number]. Topic: General - Other >> May 01, 2023 12:51 PM Dyann Glaser G wrote: Reason for CRM: ANDREA HARMON RETURNING P[facility] CALL, SHE WANTED TO LET YOU KNOW SHE CAN WAIT FOR ALL THE RESULTS FOR THE PATIENT BEFORE SHE PICKS UP THE PAPER WORK.

## 2023-05-02 ENCOUNTER — Encounter: Payer: Self-pay | Admitting: Family Medicine

## 2023-05-02 LAB — QUANTIFERON-TB GOLD PLUS
Mitogen-NIL: 9.2 [IU]/mL
NIL: 0.02 [IU]/mL
QuantiFERON-TB Gold Plus: NEGATIVE
TB1-NIL: 0 [IU]/mL
TB2-NIL: 0 [IU]/mL

## 2023-06-03 ENCOUNTER — Ambulatory Visit (HOSPITAL_COMMUNITY): Payer: Self-pay | Admitting: Psychiatry

## 2023-06-03 ENCOUNTER — Encounter (HOSPITAL_COMMUNITY): Payer: Self-pay | Admitting: Psychiatry

## 2023-06-03 ENCOUNTER — Other Ambulatory Visit: Payer: Self-pay

## 2023-06-03 VITALS — BP 154/84 | HR 67 | Ht 68.0 in | Wt 200.0 lb

## 2023-06-03 DIAGNOSIS — F4322 Adjustment disorder with anxiety: Secondary | ICD-10-CM

## 2023-06-03 MED ORDER — LORAZEPAM 0.5 MG PO TABS: ORAL_TABLET | ORAL | 4 refills | Status: DC

## 2023-06-03 NOTE — Progress Notes (Signed)
 Psychiatric Initial Adult Assessment   Patient Identification: Raymond Christensen MRN:  [id number] Date of Evaluation:  06/03/2023 Referral Source: Dr. Geraldene Kleine Chief Complaint: Irritable Visit Diagnosis:   History of Present Illness:    This patient is an 83 year old white married male with a little psychiatric past history.  He comes today with complaints of feeling excessively anxious and irritable and feeling a loss of confidence and caring for his wife.  His wife is recently been diagnosed with normal pressure hydrocephalus.  She is having significant gait problems.  She is also having significant problems with her memory.  He did married to her for 57 years and generally has a loving relationship and communicate fairly well.  The plans are for the patient and his wife to move to independent living habits with.  They are having some issues around what to take with them and not.  The patient has 2 adult sons both married and 4 grandchildren.  All of them are doing well.  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  Specifically the patient denies persistent daily depression.  He is sleeping excessively.  His appetite and energy are actually good.  He has no problems thinking and concentrating.  He has a good sense of worth.  He is not suicidal now and never has been.  He still enjoys things including reading.  Therefore he shows no vegetative symptoms.  The patient denies the use of alcohol or drugs.  The patient has never been psychotic.  In close evaluation she has no distinct evidence of a major depressive episode.  He did see a psychiatrist a few years ago but is unclear of the diagnosis or the treatment.  Patient denies symptoms of mania.  He denies specific symptoms of a anxiety disorder.  The patient is able to function very well.  He does the bills for the house and drives. His medical illnesses include coronary artery disease.  He is status  post an MI and had a CABG many decades ago.  He also has a thyroid  disease and takes Synthroid  and Cytomel .  His past psychiatric history is significant for never being in a psychiatric hospital.  He saw one psychiatrist for only a few times and saw a therapist a few times many years ago.  He found it was not helpful.  Associated Signs/Symptoms: Depression Symptoms:  depressed mood, (Hypo) Manic Symptoms:   Anxiety Symptoms:  Excessive Worry, Psychotic Symptoms:   PTSD Symptoms: NA  Past Psychiatric History:   Previous Psychotropic Medications: Yes   Substance Abuse History in the last 12 months:  No.  Consequences of Substance Abuse: NA  Past Medical History:  Past Medical History:  Diagnosis Date   Arthritis    CAD (coronary artery disease)    Cancer (HCC)    skin lt. arm.   Carotid artery disease (HCC)    s/p left CEA 01/17/14   Chronic systolic CHF (congestive heart failure) [facility])    Concussion Summer 2012   Essential hypertension 02/26/2017   Heart disease    Hyperlipidemia    Hypothyroidism 10/15/2017   Major depressive disorder    Mitral valve regurgitation    Myocardial infarction (HCC) 1996, 2021   Obstructive sleep apnea on CPAP    Persistent atrial fibrillation (HCC)    Poor flexibility of tendon 12/01/2018   S/P mitral valve clip implantation 12/23/2019   s/p TEER with MitraClip with one  XTW by Dr. Arlester Ladd   Subungual hematoma of digit of hand 12/01/2018   Subungual hematoma of right foot 12/01/2018   Type 2 diabetes mellitus without complication, without long-term current use of insulin  (HCC) 02/26/2017    Past Surgical History:  Procedure Laterality Date   ATRIAL FIBRILLATION ABLATION N/A 02/01/2020   Procedure: ATRIAL FIBRILLATION ABLATION;  Surgeon: Lei Pump, MD;  Location: [facility] INVASIVE CV LAB;  Service: Cardiovascular;  Laterality: N/A;   BYPASS GRAFT  2001   CARDIAC CATHETERIZATION     CARDIOVERSION N/A 11/08/2019   Procedure:  CARDIOVERSION;  Surgeon: Loyde Rule, MD;  Location: [facility] ENDOSCOPY;  Service: Cardiovascular;  Laterality: N/A;   CAROTID ENDARTERECTOMY Left 01/17/2014   CORONARY ANGIOPLASTY     CORONARY ARTERY BYPASS GRAFT     LEFT HEART CATH AND CORS/GRAFTS ANGIOGRAPHY N/A 06/26/2022   Procedure: LEFT HEART CATH AND CORS/GRAFTS ANGIOGRAPHY;  Surgeon: Millicent Ally, MD;  Location: [facility] INVASIVE CV LAB;  Service: Cardiovascular;  Laterality: N/A;   RIGHT/LEFT HEART CATH AND CORONARY/GRAFT ANGIOGRAPHY N/A 09/09/2019   Procedure: RIGHT/LEFT HEART CATH AND CORONARY/GRAFT ANGIOGRAPHY;  Surgeon: Avanell Leigh, MD;  Location: [facility] INVASIVE CV LAB;  Service: Cardiovascular;  Laterality: N/A;   TEE WITHOUT CARDIOVERSION N/A 09/13/2019   Procedure: TRANSESOPHAGEAL ECHOCARDIOGRAM (TEE);  Surgeon: Sheryle Donning, MD;  Location: [facility] ENDOSCOPY;  Service: Cardiovascular;  Laterality: N/A;   TEE WITHOUT CARDIOVERSION N/A 12/23/2019   Procedure: TRANSESOPHAGEAL ECHOCARDIOGRAM (TEE);  Surgeon: Arnoldo Lapping, MD;  Location: [facility] INVASIVE CV LAB;  Service: Cardiovascular;  Laterality: N/A;   TEE WITHOUT CARDIOVERSION  02/01/2020   Procedure: TRANSESOPHAGEAL ECHOCARDIOGRAM (TEE);  Surgeon: Lei Pump, MD;  Location: [facility] INVASIVE CV LAB;  Service: Cardiovascular;;   TOTAL HIP ARTHROPLASTY  1994, 1995   TOTAL HIP ARTHROPLASTY Bilateral    TRANSCATHETER MITRAL EDGE TO EDGE REPAIR N/A 12/23/2019   Procedure: MITRAL VALVE REPAIR;  Surgeon: Arnoldo Lapping, MD;  Location[facility] CV LAB;  Service: Cardiovascular;  Laterality: N/A;   TRANSURETHRAL RESECTION OF PROSTATE N/A 12/27/2022   Procedure: TRANSURETHRAL RESECTION OF THE PROSTATE (TURP);  Surgeon: Christina Coyer, MD;  Location: [facility] ORS;  Service: Urology;  Laterality: N/A;  75 minute case    Family Psychiatric History:   Family History:  Family History  Problem Relation Age of Onset   Dementia Father        Concerns surrounding Lewy body dementia,  but never officially diagnosed   Cancer Neg Hx     Social History:   Social History   Socioeconomic History   Marital status: Married    Spouse name: Not on file   Number of children: 2   Years of education: 19   Highest education level: Doctorate  Occupational History   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  Tobacco Use   Smoking status: Former    Current packs/day: 0.00    Types: Cigarettes    Quit date: 1980    Years since quitting: 45.4   Smokeless tobacco: Never  Vaping Use   Vaping status: Never Used  Substance and Sexual Activity   Alcohol use: Yes    Alcohol/week: 1.0 standard drink of alcohol    Types: 1 Cans of beer per week    Comment: occasional   Drug use: No   Sexual activity: Yes  Other Topics Concern   Not on file  Social History Narrative   Pt is R handed   Lives in single story home with his wife, Sheryle Donning   Has  2 adult children   PhD in Biology   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 [employer]    Social Drivers of Health   Financial Resource Strain: Low Risk  (07/31/2022)   Overall Financial Resource Strain (CARDIA)    Difficulty of Paying Living Expenses: Not hard at all  Food Insecurity: No Food Insecurity (06/26/2022)   Hunger Vital Sign    Worried About Running Out of Food in the Last Year: Never true    Ran Out of Food in the Last Year: Never true  Transportation Needs: No Transportation Needs (06/26/2022)   PRAPARE - Administrator, Civil Service (Medical): No    Lack of Transportation (Non-Medical): No  Physical Activity: Inactive (07/31/2022)   Exercise Vital Sign    Days of Exercise per Week: 0 days    Minutes of Exercise per Session: 0 min  Stress: No Stress Concern Present (07/31/2022)   [employer] of Occupational Health - Occupational Stress Questionnaire    Feeling of Stress : Not at all  Social Connections: Socially Integrated (07/31/2022)   Social Connection and Isolation Panel [NHANES]    Frequency of Communication with Friends  and Family: Three times a week    Frequency of Social Gatherings with Friends and Family: Once a week    Attends Religious Services: More than 4 times per year    Active Member of [employer] or Organizations: Yes    Attends Engineer, structural: More than 4 times per year    Marital Status: Married    Additional Social History:   Allergies:   Allergies  Allergen Reactions   Sulfa Antibiotics Rash    Questionable, he developed a diffuse rash 2 days after stopping Bactrim    Metabolic Disorder Labs: Lab Results  Component Value Date   HGBA1C 5.7 12/17/2022   MPG 108.28 06/26/2022   MPG 120 05/03/2022   No results found for: "PROLACTIN" Lab Results  Component Value Date   CHOL 113 12/17/2022   TRIG 88.0 12/17/2022   HDL 39.00 (L) 12/17/2022   CHOLHDL 3 12/17/2022   VLDL 17.6 12/17/2022   LDLCALC 56 12/17/2022   LDLCALC 34 06/26/2022   Lab Results  Component Value Date   TSH 2.40 12/17/2022    Therapeutic Level Labs: No results found for: "LITHIUM" No results found for: "CBMZ" No results found for: "VALPROATE"  Current Medications: Current Outpatient Medications  Medication Sig Dispense Refill   acetaminophen  (TYLENOL ) 500 MG tablet Take 1,000 mg by mouth every 6 (six) hours as needed for moderate pain or headache.     apixaban  (ELIQUIS ) 5 MG TABS tablet Take 1 tablet (5 mg total) by mouth 2 (two) times daily. 180 tablet 1   atorvastatin  (LIPITOR ) 80 MG tablet Take 1 tablet (80 mg total) by mouth daily. 90 tablet 3   Cholecalciferol  (VITAMIN D3) 250 MCG (10000 UT) capsule Take 10,000 Units by mouth daily.      ezetimibe  (ZETIA ) 10 MG tablet Take 1 tablet (10 mg total) by mouth daily. 90 tablet 3   famotidine  (PEPCID ) 20 MG tablet Take 1 tablet (20 mg total) by mouth daily. 30 tablet 3   finasteride  (PROSCAR ) 5 MG tablet Take 1 tablet (5 mg total) by mouth daily. 90 tablet 3   levothyroxine  (SYNTHROID ) 75 MCG tablet Take 1 tablet (75 mcg total) by mouth daily. 90  tablet 1   liothyronine  (CYTOMEL ) 5 MCG tablet Take 2 tablets (10 mcg total) by mouth daily. 180 tablet 1  LORazepam (ATIVAN) 0.5 MG tablet 1 bid 60 tablet 4   metoprolol  succinate (TOPROL -XL) 25 MG 24 hr tablet Take 0.5 tablets (12.5 mg total) by mouth daily. 45 tablet 3   Multiple Vitamins-Minerals (MULTIVITAMIN ADULT EXTRA C PO) Take 1 tablet by mouth daily.      nitroGLYCERIN  (NITROSTAT ) 0.4 MG SL tablet Place 1 tablet (0.4 mg total) under the tongue every 5 (five) minutes as needed for chest pain. 30 tablet 12   PARoxetine  (PAXIL ) 10 MG tablet TAKE 1 TABLET BY MOUTH DAILY 30 tablet 1   Semaglutide , 2 MG/DOSE, 8 MG/3ML SOPN Inject 2 mg as directed once a week. Sunday 9 mL 1   fluticasone  (FLONASE ) 50 MCG/ACT nasal spray Place 2 sprays into both nostrils daily. (Patient not taking: Reported on 06/03/2023) 16 g 6   No current facility-administered medications for this visit.    Musculoskeletal: Strength & Muscle Tone: within normal limits Gait & Station: normal Patient leans: N/A  Psychiatric Specialty Exam: Review of Systems  Blood pressure (!) 154/84, pulse 67, height 5\' 8"  (1.727 m), weight 200 lb (90.7 kg).Body mass index is 30.41 kg/m.  General Appearance: Casual  Eye Contact:  Good  Speech:  NA  Volume:  Normal  Mood:  Irritable  Affect:  NA  Thought Process:  Goal Directed  Orientation:  Full (Time, Place, and Person)  Thought Content:  WDL  Suicidal Thoughts:  No  Homicidal Thoughts:  No  Memory:  NA  Judgement:  Good  Insight:  Good  Psychomotor Activity:  NA  Concentration:    Recall:  Good  Fund of Knowledge:Fair  Language: Good  Akathisia:  No  Handed:  Right  AIMS (if indicated):    Assets:  Desire for Improvement  ADL's:  Intact  Cognition: WNL  Sleep:  Good   Screenings: GAD-7    Flowsheet Row Office Visit from 01/10/2023 in [facility] at [facility]  Total GAD-7 Score 4      Mini-Mental    Flowsheet Row  Clinical Support from 01/12/2018 in [facility] at [facility]  Total Score (max 30 points ) 29      PHQ2-9    Flowsheet Row Office Visit from 01/10/2023 in [facility] at [facility] Office Visit from 12/17/2022 in [facility] Primary Care at [facility] CARDIAC REHAB PHASE II EXERCISE from 10/23/2022 in [facility] Office Visit from 10/07/2022 in [facility] Primary Care at [facility] Clinical Support from 07/31/2022 in [facility] Primary Care at [facility]  PHQ-2 Total Score 1 0 0 0 0  PHQ-9 Total Score 2 -- 0 0 --      Flowsheet Row Admission (Discharged) from 12/27/2022 in [place] [facility] PERIOPER[facility]IVE ARE[facility] ED to Hosp-Admission (Discharged) from 06/25/2022 in [facility] ED from 02/01/2022 in [facility] Emergency Department at [facility]  C-SSRS RISK CATEGORY No Risk No Risk No Risk       Assessment and Plan:   Today this patient seems to meet the criteria of an adjustment disorder with an anxious mood state.  He shows no symptoms specifically an anxiety disorder or a depression disorder.  His affect is responsive perfectly appropriate for her elderly man who is married to a wife who probably has a progressive dementing disorder.  His wife is clearly failing.  Their plans are to move to an independent living which can be stressful in of itself to move.  However the patient is moving to a situation that likely will not meet their needs ultimately.  He is ambivalent about moving.  His family is pressuring her even supporting him for moving.  The best interventions for this patient at this time is a low dose of Ativan 0.5 mg twice daily but more importantly is to involve a case manager/social worker to help him go through this transition.  The patient feels helpless.  He does not feel that the providers  have given him tools to deal with what is going on.  I suspect that frustrates him irritates him and I think it comes out in his care with his wife.  My interventions will be contacted [facility] to see if I can get them a collaborative person to help with either a nurse and/or a case Production designer, theatre/television/film.  There are other persons possibly to be involved in marital treatment I am not sure that is indicated at this time.  It is a possibility to be considered when he returns to see me in about 6 to 7 weeks.  The patient is not suicidal and he is functioning very well.  Collaboration of Care:   Patient/Guardian was advised Release of Information must be obtained prior to any record release in order to collaborate their care with an outside provider. Patient/Guardian was advised if they have not already done so to contact the registration department to sign all necessary forms in order for us  to release information regarding their care.   Consent: Patient/Guardian gives verbal consent for treatment and assignment of benefits for services provided during this visit. Patient/Guardian expressed understanding and agreed to proceed.   Delorse Fey, MD 5/20/20252:13 PM

## 2023-06-11 ENCOUNTER — Other Ambulatory Visit: Payer: Self-pay | Admitting: Family Medicine

## 2023-06-11 DIAGNOSIS — F411 Generalized anxiety disorder: Secondary | ICD-10-CM

## 2023-06-23 ENCOUNTER — Other Ambulatory Visit: Payer: Self-pay | Admitting: Internal Medicine

## 2023-06-23 DIAGNOSIS — I4819 Other persistent atrial fibrillation: Secondary | ICD-10-CM

## 2023-06-24 ENCOUNTER — Ambulatory Visit: Payer: Medicare (Managed Care) | Admitting: Family Medicine

## 2023-06-24 ENCOUNTER — Encounter: Payer: Self-pay | Admitting: Family Medicine

## 2023-06-24 VITALS — BP 130/78 | HR 82 | Temp 98.0°F | Resp 16 | Ht 68.0 in | Wt 197.0 lb

## 2023-06-24 DIAGNOSIS — E039 Hypothyroidism, unspecified: Secondary | ICD-10-CM | POA: Diagnosis not present

## 2023-06-24 DIAGNOSIS — Z7985 Long-term (current) use of injectable non-insulin antidiabetic drugs: Secondary | ICD-10-CM

## 2023-06-24 DIAGNOSIS — E1169 Type 2 diabetes mellitus with other specified complication: Secondary | ICD-10-CM

## 2023-06-24 DIAGNOSIS — E669 Obesity, unspecified: Secondary | ICD-10-CM

## 2023-06-24 NOTE — Patient Instructions (Signed)
Give us 2-3 business days to get the results of your labs back.   Keep the diet clean and stay active.  Let us know if you need anything.  EXERCISES RANGE OF MOTION (ROM) AND STRETCHING EXERCISES  These exercises may help you when beginning to rehabilitate your issue. In order to successfully resolve your symptoms, you must improve your posture. These exercises are designed to help reduce the forward-head and rounded-shoulder posture which contributes to this condition. Your symptoms may resolve with or without further involvement from your physician, physical therapist or athletic trainer. While completing these exercises, remember:   Restoring tissue flexibility helps normal motion to return to the joints. This allows healthier, less painful movement and activity.  An effective stretch should be held for at least 20 seconds, although you may need to begin with shorter hold times for comfort.  A stretch should never be painful. You should only feel a gentle lengthening or release in the stretched tissue.  Do not do any stretch or exercise that you cannot tolerate.  STRETCH- Axial Extensors  Lie on your back on the floor. You may bend your knees for comfort. Place a rolled-up hand towel or dish towel, about 2 inches in diameter, under the part of your head that makes contact with the floor.  Gently tuck your chin, as if trying to make a "double chin," until you feel a gentle stretch at the base of your head.  Hold 15-20 seconds. Repeat 2-3 times. Complete this exercise 1 time per day.   STRETCH - Axial Extension   Stand or sit on a firm surface. Assume a good posture: chest up, shoulders drawn back, abdominal muscles slightly tense, knees unlocked (if standing) and feet hip width apart.  Slowly retract your chin so your head slides back and your chin slightly lowers. Continue to look straight ahead.  You should feel a gentle stretch in the back of your head. Be certain not to feel an  aggressive stretch since this can cause headaches later.  Hold for 15-20 seconds. Repeat 2-3 times. Complete this exercise 1 time per day.  STRETCH - Cervical Side Bend   Stand or sit on a firm surface. Assume a good posture: chest up, shoulders drawn back, abdominal muscles slightly tense, knees unlocked (if standing) and feet hip width apart.  Without letting your nose or shoulders move, slowly tip your right / left ear to your shoulder until your feel a gentle stretch in the muscles on the opposite side of your neck.  Hold 15-20 seconds. Repeat 2-3 times. Complete this exercise 1-2 times per day.  STRETCH - Cervical Rotators   Stand or sit on a firm surface. Assume a good posture: chest up, shoulders drawn back, abdominal muscles slightly tense, knees unlocked (if standing) and feet hip width apart.  Keeping your eyes level with the ground, slowly turn your head until you feel a gentle stretch along the back and opposite side of your neck.  Hold 15-20 seconds. Repeat 2-3 times. Complete this exercise 1-2 times per day.  RANGE OF MOTION - Neck Circles   Stand or sit on a firm surface. Assume a good posture: chest up, shoulders drawn back, abdominal muscles slightly tense, knees unlocked (if standing) and feet hip width apart.  Gently roll your head down and around from the back of one shoulder to the back of the other. The motion should never be forced or painful.  Repeat the motion 10-20 times, or until you feel   the neck muscles relax and loosen. Repeat 2-3 times. Complete the exercise 1-2 times per day. STRENGTHENING EXERCISES - Cervical Strain and Sprain These exercises may help you when beginning to rehabilitate your injury. They may resolve your symptoms with or without further involvement from your physician, physical therapist, or athletic trainer. While completing these exercises, remember:   Muscles can gain both the endurance and the strength needed for everyday  activities through controlled exercises.  Complete these exercises as instructed by your physician, physical therapist, or athletic trainer. Progress the resistance and repetitions only as guided.  You may experience muscle soreness or fatigue, but the pain or discomfort you are trying to eliminate should never worsen during these exercises. If this pain does worsen, stop and make certain you are following the directions exactly. If the pain is still present after adjustments, discontinue the exercise until you can discuss the trouble with your clinician.  STRENGTH - Cervical Flexors, Isometric  Face a wall, standing about 6 inches away. Place a small pillow, a ball about 6-8 inches in diameter, or a folded towel between your forehead and the wall.  Slightly tuck your chin and gently push your forehead into the soft object. Push only with mild to moderate intensity, building up tension gradually. Keep your jaw and forehead relaxed.  Hold 10 to 20 seconds. Keep your breathing relaxed.  Release the tension slowly. Relax your neck muscles completely before you start the next repetition. Repeat 2-3 times. Complete this exercise 1 time per day.  STRENGTH- Cervical Lateral Flexors, Isometric   Stand about 6 inches away from a wall. Place a small pillow, a ball about 6-8 inches in diameter, or a folded towel between the side of your head and the wall.  Slightly tuck your chin and gently tilt your head into the soft object. Push only with mild to moderate intensity, building up tension gradually. Keep your jaw and forehead relaxed.  Hold 10 to 20 seconds. Keep your breathing relaxed.  Release the tension slowly. Relax your neck muscles completely before you start the next repetition. Repeat 2-3 times. Complete this exercise 1 time per day.  STRENGTH - Cervical Extensors, Isometric   Stand about 6 inches away from a wall. Place a small pillow, a ball about 6-8 inches in diameter, or a folded  towel between the back of your head and the wall.  Slightly tuck your chin and gently tilt your head back into the soft object. Push only with mild to moderate intensity, building up tension gradually. Keep your jaw and forehead relaxed.  Hold 10 to 20 seconds. Keep your breathing relaxed.  Release the tension slowly. Relax your neck muscles completely before you start the next repetition. Repeat 2-3 times. Complete this exercise 1 time per day.  POSTURE AND BODY MECHANICS CONSIDERATIONS Keeping correct posture when sitting, standing or completing your activities will reduce the stress put on different body tissues, allowing injured tissues a chance to heal and limiting painful experiences. The following are general guidelines for improved posture. Your physician or physical therapist will provide you with any instructions specific to your needs. While reading these guidelines, remember:  The exercises prescribed by your provider will help you have the flexibility and strength to maintain correct postures.  The correct posture provides the optimal environment for your joints to work. All of your joints have less wear and tear when properly supported by a spine with good posture. This means you will experience a healthier, less painful body.    Correct posture must be practiced with all of your activities, especially prolonged sitting and standing. Correct posture is as important when doing repetitive low-stress activities (typing) as it is when doing a single heavy-load activity (lifting).  PROLONGED STANDING WHILE SLIGHTLY LEANING FORWARD When completing a task that requires you to lean forward while standing in one place for a long time, place either foot up on a stationary 2- to 4-inch high object to help maintain the best posture. When both feet are on the ground, the low back tends to lose its slight inward curve. If this curve flattens (or becomes too large), then the back and your other  joints will experience too much stress, fatigue more quickly, and can cause pain.   RESTING POSITIONS Consider which positions are most painful for you when choosing a resting position. If you have pain with flexion-based activities (sitting, bending, stooping, squatting), choose a position that allows you to rest in a less flexed posture. You would want to avoid curling into a fetal position on your side. If your pain worsens with extension-based activities (prolonged standing, working overhead), avoid resting in an extended position such as sleeping on your stomach. Most people will find more comfort when they rest with their spine in a more neutral position, neither too rounded nor too arched. Lying on a non-sagging bed on your side with a pillow between your knees, or on your back with a pillow under your knees will often provide some relief. Keep in mind, being in any one position for a prolonged period of time, no matter how correct your posture, can still lead to stiffness.  WALKING Walk with an upright posture. Your ears, shoulders, and hips should all line up. OFFICE WORK When working at a desk, create an environment that supports good, upright posture. Without extra support, muscles fatigue and lead to excessive strain on joints and other tissues.  CHAIR:  A chair should be able to slide under your desk when your back makes contact with the back of the chair. This allows you to work closely.  The chair's height should allow your eyes to be level with the upper part of your monitor and your hands to be slightly lower than your elbows.  Body position: ? Your feet should make contact with the floor. If this is not possible, use a foot rest. ? Keep your ears over your shoulders. This will reduce stress on your neck and low back.  

## 2023-06-24 NOTE — Progress Notes (Signed)
 Subjective:   Chief Complaint  Patient presents with   Medication Refill    Medication Check     Raymond Christensen is a 83 y.o. male here for follow-up of diabetes.   Raymond Christensen does not routinely check his sugars. Patient does not require insulin .   Medications include: Ozempic  2 mg/week Diet is OK.  Exercise: not much No CP or SOB.   Hypothyroidism Patient presents for follow-up of hypothyroidism.  Reports compliance with medication-levothyroxine  75 mcg daily, Cytomel  10 mcg daily. Current symptoms include: denies fatigue, weight changes, heat/cold intolerance, bowel/skin changes or CVS symptoms He believes his dose should be not significantly changed  Past Medical History:  Diagnosis Date   Arthritis    CAD (coronary artery disease)    Cancer (HCC)    skin lt. arm.   Carotid artery disease (HCC)    s/p left CEA 01/17/14   Chronic systolic CHF (congestive heart failure) (HCC)    Concussion Summer 2012   Essential hypertension 02/26/2017   Heart disease    Hyperlipidemia    Hypothyroidism 10/15/2017   Major depressive disorder    Mitral valve regurgitation    Myocardial infarction [facility]) 1996, 2021   Obstructive sleep apnea on CPAP    Persistent atrial fibrillation (HCC)    Poor flexibility of tendon 12/01/2018   S/P mitral valve clip implantation 12/23/2019   s/p TEER with MitraClip with one XTW by Dr. Arlester Ladd   Subungual hematoma of digit of hand 12/01/2018   Subungual hematoma of right foot 12/01/2018   Type 2 diabetes mellitus without complication, without long-term current use of insulin  (HCC) 02/26/2017     Related testing: Retinal exam: Done Pneumovax: done  Objective:  BP 130/78 (BP Location: Left Arm, Patient Position: Sitting)   Pulse 82   Temp 98 F (36.7 C) (Oral)   Resp 16   Ht 5\' 8"  (1.727 m)   Wt 197 lb (89.4 kg)   SpO2 95%   BMI 29.95 kg/m  General:  Well developed, well nourished, in no apparent distress Lungs:  CTAB, no access msc use Cardio:   RRR, no bruits, 1+ pitting bilateral lower extremity edema around the ankles Psych: Age appropriate judgment and insight  Assessment:   Type 2 diabetes mellitus with obesity (HCC) - Plan: Comprehensive metabolic panel with GFR, Hemoglobin A1c, Lipid panel  Hypothyroidism, unspecified type - Plan: TSH, T4, free   Plan:   Chronic, stable.  Continue semaglutide  2 mg weekly.  Check labs.  Counseled on diet and exercise. Chronic, stable.  Continue Cytomel  10 mcg daily, levothyroxine  75 mcg daily. F/u in 6 mo. The patient voiced understanding and agreement to the plan.  Raymond Dials [place], DO 06/24/23 11:58 AM

## 2023-06-24 NOTE — Telephone Encounter (Signed)
 Prescription refill request for Eliquis  received. Indication:afib Last office visit:3/25 Scr:0.90  12/24 Age: 83 Weight:90.7  kg  Prescription refilled

## 2023-06-27 ENCOUNTER — Ambulatory Visit: Payer: Self-pay | Admitting: Family Medicine

## 2023-07-29 ENCOUNTER — Ambulatory Visit (HOSPITAL_COMMUNITY): Admitting: Psychiatry

## 2023-07-29 ENCOUNTER — Encounter (HOSPITAL_COMMUNITY): Payer: Self-pay | Admitting: Psychiatry

## 2023-07-29 VITALS — BP 135/80 | HR 64 | Resp 12 | Wt 197.0 lb

## 2023-07-29 DIAGNOSIS — F4323 Adjustment disorder with mixed anxiety and depressed mood: Secondary | ICD-10-CM

## 2023-07-29 MED ORDER — LORAZEPAM 0.5 MG PO TABS: ORAL_TABLET | ORAL | 4 refills | Status: AC

## 2023-07-29 NOTE — Progress Notes (Signed)
 Psychiatric Initial Adult Assessment   Patient Identification: Raymond Christensen MRN:  [id number] Date of Evaluation:  07/29/2023 Referral Source: Raymond Christensen Chief Complaint: Irritable Visit Diagnosis:   History of Present Illness:   Today the patient is seen in the office by myself.  He is still driving.  He is doing better.  His mood is improved.  His energy level is still somewhat low and he does sleep a great but overall he is eating has good energy and has no vegetative symptoms.  He is adjusting more in order to add at school.  His wife is doing better.  She is in physical and occupational therapy.  The patient feels more in control.  He is less irritable.  He is adjusting and accepting more the situation.  His kids have helped a great deal.  They have helped them with an adjustment.  However they still need to sell their own house including some moving things around.  They have to sell some things.  I think the transition of moving is still continuing.  He believes an eye to the Ativan  has helped a small amount. 5 mg twice daily.  I think overall the problem is felt in this adjustment..  He has had no falls.  He himself is functioning very well.  His wife still does all basic ADLs for herself.  Unfortunately has had difficulty finding a therapist.  Today would be to give him some specific other referrals.  Associated Signs/Symptoms: Depression Symptoms:  depressed mood, (Hypo) Manic Symptoms:   Anxiety Symptoms:  Excessive Worry, Psychotic Symptoms:   PTSD Symptoms: NA  Past Psychiatric History:   Previous Psychotropic Medications: Yes   Substance Abuse History in the last 12 months:  No.  Consequences of Substance Abuse: NA  Past Medical History:  Past Medical History:  Diagnosis Date   Arthritis    CAD (coronary artery disease)    Cancer (HCC)    skin lt. arm.   Carotid artery disease (HCC)    s/p left CEA 01/17/14   Chronic systolic CHF (congestive heart failure) [facility])     Concussion Summer 2012   Essential hypertension 02/26/2017   Heart disease    Hyperlipidemia    Hypothyroidism 10/15/2017   Major depressive disorder    Mitral valve regurgitation    Myocardial infarction (HCC) 1996, 2021   Obstructive sleep apnea on CPAP    Persistent atrial fibrillation (HCC)    Poor flexibility of tendon 12/01/2018   S/P mitral valve clip implantation 12/23/2019   s/p TEER with MitraClip with one XTW by Dr. Wonda   Subungual hematoma of digit of hand 12/01/2018   Subungual hematoma of right foot 12/01/2018   Type 2 diabetes mellitus without complication, without long-term current use of insulin  (HCC) 02/26/2017    Past Surgical History:  Procedure Laterality Date   ATRIAL FIBRILLATION ABLATION N/A 02/01/2020   Procedure: ATRIAL FIBRILLATION ABLATION;  Surgeon: Raymond Soyla Lunger, MD;  Location: [facility] INVASIVE CV LAB;  Service: Cardiovascular;  Laterality: N/A;   BYPASS GRAFT  2001   CARDIAC CATHETERIZATION     CARDIOVERSION N/A 11/08/2019   Procedure: CARDIOVERSION;  Surgeon: Raymond Maude BROCKS, MD;  Location: [facility] ENDOSCOPY;  Service: Cardiovascular;  Laterality: N/A;   CAROTID ENDARTERECTOMY Left 01/17/2014   CORONARY ANGIOPLASTY     CORONARY ARTERY BYPASS GRAFT     LEFT HEART CATH AND CORS/GRAFTS ANGIOGRAPHY N/A 06/26/2022   Procedure: LEFT HEART CATH AND CORS/GRAFTS ANGIOGRAPHY;  Surgeon: Raymond Debby LABOR,  MD;  Location: [facility] INVASIVE CV LAB;  Service: Cardiovascular;  Laterality: N/A;   RIGHT/LEFT HEART CATH AND CORONARY/GRAFT ANGIOGRAPHY N/A 09/09/2019   Procedure: RIGHT/LEFT HEART CATH AND CORONARY/GRAFT ANGIOGRAPHY;  Surgeon: Raymond Dorn PARAS, MD;  Location: [facility] INVASIVE CV LAB;  Service: Cardiovascular;  Laterality: N/A;   TEE WITHOUT CARDIOVERSION N/A 09/13/2019   Procedure: TRANSESOPHAGEAL ECHOCARDIOGRAM (TEE);  Surgeon: Raymond Slain, MD;  Location: [facility] ENDOSCOPY;  Service: Cardiovascular;  Laterality: N/A;   TEE WITHOUT CARDIOVERSION N/A  12/23/2019   Procedure: TRANSESOPHAGEAL ECHOCARDIOGRAM (TEE);  Surgeon: Raymond Sharper, MD;  Location: [facility] INVASIVE CV LAB;  Service: Cardiovascular;  Laterality: N/A;   TEE WITHOUT CARDIOVERSION  02/01/2020   Procedure: TRANSESOPHAGEAL ECHOCARDIOGRAM (TEE);  Surgeon: Raymond Soyla Lunger, MD;  Location: [facility] INVASIVE CV LAB;  Service: Cardiovascular;;   TOTAL HIP ARTHROPLASTY  1994, 1995   TOTAL HIP ARTHROPLASTY Bilateral    TRANSCATHETER MITRAL EDGE TO EDGE REPAIR N/A 12/23/2019   Procedure: MITRAL VALVE REPAIR;  Surgeon: Raymond Sharper, MD;  Location: [facility] INVASIVE CV LAB;  Service: Cardiovascular;  Laterality: N/A;   TRANSURETHRAL RESECTION OF PROSTATE N/A 12/27/2022   Procedure: TRANSURETHRAL RESECTION OF THE PROSTATE (TURP);  Surgeon: Raymond Cough, MD;  Location: [facility] ORS;  Service: Urology;  Laterality: N/A;  75 minute case    Family Psychiatric History:   Family History:  Family History  Problem Relation Age of Onset   Dementia Father        Concerns surrounding Lewy body dementia, but never officially diagnosed   Cancer Neg Hx     Social History:   Social History   Socioeconomic History   Marital status: Married    Spouse name: Not on file   Number of children: 2   Years of education: 19   Highest education level: Doctorate  Occupational History   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  Tobacco Use   Smoking status: Former    Current packs/day: 0.00    Types: Cigarettes    Quit date: 1980    Years since quitting: 45.5   Smokeless tobacco: Never  Vaping Use   Vaping status: Never Used  Substance and Sexual Activity   Alcohol use: Yes    Alcohol/week: 1.0 standard drink of alcohol    Types: 1 Cans of beer per week    Comment: occasional   Drug use: No   Sexual activity: Yes  Other Topics Concern   Not on file  Social History Narrative   Pt is R handed   Lives in single story home with his wife, Raymond Christensen   Has 2 adult children   PhD in Biology   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 [employer]    Social Drivers of Health   Financial Resource Strain: Low Risk  (07/31/2022)   Overall Financial Resource Strain (CARDIA)    Difficulty of Paying Living Expenses: Not hard at all  Food Insecurity: No Food Insecurity (06/26/2022)   Hunger Vital Sign    Worried About Running Out of Food in the Last Year: Never true    Ran Out of Food in the Last Year: Never true  Transportation Needs: No Transportation Needs (06/26/2022)   PRAPARE - Administrator, Civil Service (Medical): No    Lack of Transportation (Non-Medical): No  Physical Activity: Inactive (07/31/2022)   Exercise Vital Sign    Days of Exercise per Week: 0 days    Minutes of Exercise per Session: 0 min  Stress: No Stress Concern Present (07/31/2022)  [employer] of Occupational Health - Occupational Stress Questionnaire    Feeling of Stress : Not at all  Social Connections: Socially Integrated (07/31/2022)   Social Connection and Isolation Panel    Frequency of Communication with Friends and Family: Three times a week    Frequency of Social Gatherings with Friends and Family: Once a week    Attends Religious Services: More than 4 times per year    Active Member of [employer] or Organizations: Yes    Attends Engineer, structural: More than 4 times per year    Marital Status: Married    Additional Social History:   Allergies:   Allergies  Allergen Reactions   Sulfa Antibiotics Rash    Questionable, he developed a diffuse rash 2 days after stopping Bactrim    Metabolic Disorder Labs: Lab Results  Component Value Date   HGBA1C 5.6 06/24/2023   MPG 108.28 06/26/2022   MPG 120 05/03/2022   No results found for: PROLACTIN Lab Results  Component Value Date   CHOL 107 06/24/2023   TRIG 75.0 06/24/2023   HDL 42.40 06/24/2023   CHOLHDL 3 06/24/2023   VLDL 15.0 06/24/2023   LDLCALC 50 06/24/2023   LDLCALC 56 12/17/2022   Lab Results  Component Value Date   TSH 1.26  06/24/2023    Therapeutic Level Labs: No results found for: LITHIUM No results found for: CBMZ No results found for: VALPROATE  Current Medications: Current Outpatient Medications  Medication Sig Dispense Refill   acetaminophen  (TYLENOL ) 500 MG tablet Take 1,000 mg by mouth every 6 (six) hours as needed for moderate pain or headache.     atorvastatin  (LIPITOR ) 80 MG tablet Take 1 tablet (80 mg total) by mouth daily. 90 tablet 3   Cholecalciferol  (VITAMIN D3) 250 MCG (10000 UT) capsule Take 10,000 Units by mouth daily.      ELIQUIS  5 MG TABS tablet TAKE 1 TABLET BY MOUTH TWICE  DAILY 180 tablet 3   ezetimibe  (ZETIA ) 10 MG tablet Take 1 tablet (10 mg total) by mouth daily. 90 tablet 3   famotidine  (PEPCID ) 20 MG tablet Take 1 tablet (20 mg total) by mouth daily. 30 tablet 3   finasteride  (PROSCAR ) 5 MG tablet Take 1 tablet (5 mg total) by mouth daily. 90 tablet 3   fluticasone  (FLONASE ) 50 MCG/ACT nasal spray Place 2 sprays into both nostrils daily. 16 g 6   levothyroxine  (SYNTHROID ) 75 MCG tablet Take 1 tablet (75 mcg total) by mouth daily. 90 tablet 1   liothyronine  (CYTOMEL ) 5 MCG tablet Take 2 tablets (10 mcg total) by mouth daily. 180 tablet 1   metoprolol  succinate (TOPROL -XL) 25 MG 24 hr tablet Take 0.5 tablets (12.5 mg total) by mouth daily. 45 tablet 3   Multiple Vitamins-Minerals (MULTIVITAMIN ADULT EXTRA C PO) Take 1 tablet by mouth daily.      nitroGLYCERIN  (NITROSTAT ) 0.4 MG SL tablet Place 1 tablet (0.4 mg total) under the tongue every 5 (five) minutes as needed for chest pain. 30 tablet 12   PARoxetine  (PAXIL ) 10 MG tablet Take 1 tablet (10 mg total) by mouth daily. 90 tablet 0   Semaglutide , 2 MG/DOSE, 8 MG/3ML SOPN Inject 2 mg as directed once a week. Sunday 9 mL 1   LORazepam  (ATIVAN ) 0.5 MG tablet 1 bid 60 tablet 4   No current facility-administered medications for this visit.    Musculoskeletal: Strength & Muscle Tone: within normal limits Gait & Station:  normal Patient leans: N/A  Psychiatric Specialty Exam: Review of Systems  Blood pressure 135/80, pulse 64, resp. rate 12, weight 197 lb (89.4 kg).Body mass index is 29.95 kg/m.  General Appearance: Casual  Eye Contact:  Good  Speech:  NA  Volume:  Normal  Mood:  Irritable  Affect:  NA  Thought Process:  Goal Directed  Orientation:  Full (Time, Place, and Person)  Thought Content:  WDL  Suicidal Thoughts:  No  Homicidal Thoughts:  No  Memory:  NA  Judgement:  Good  Insight:  Good  Psychomotor Activity:  NA  Concentration:    Recall:  Good  Fund of Knowledge:Fair  Language: Good  Akathisia:  No  Handed:  Right  AIMS (if indicated):    Assets:  Desire for Improvement  ADL's:  Intact  Cognition: WNL  Sleep:  Good   Screenings: GAD-7    Flowsheet Row Office Visit from 01/10/2023 in [facility] Primary Care at [facility]  Total GAD-7 Score 4   Mini-Mental    Flowsheet Row Clinical Support from 01/12/2018 in [facility] Primary Care at [facility]  Total Score (max 30 points ) 29   PHQ2-9    Flowsheet Row Office Visit from 07/29/2023 in [facility] Office Visit from 01/10/2023 in [facility] Primary Care at [facility] Office Visit from 12/17/2022 in [facility] at [facility] CARDIAC REHAB PHASE II EXERCISE from 10/23/2022 in [facility] Office Visit from 10/07/2022 in [facility] Primary Care at [facility]  PHQ-2 Total Score 2 1 0 0 0  PHQ-9 Total Score 6 2 -- 0 0   Flowsheet Row Admission (Discharged) from 12/27/2022 in [place] [facility] PERIOPERATIVE AREA ED to Hosp-Admission (Discharged) from 06/25/2022 in [place] 6E Progressive Care ED from 02/01/2022 in [facility] Emergency Department at [facility]  C-SSRS RISK CATEGORY No Risk No Risk No Risk    Assessment and  Plan:    The patient's anxiety and mood state is improved.  He is adjusting more more.  There continue taking Ativan  0.5 mg twice daily and today given the name of the members of her therapy centers.  The patient return to see me in 3 months.  Overall is moving in the direction that is good.  He still drives his still independent and his wife I think is getting better better.  They are actually planning a trip to go to [place].  Noted however the patient's wife is likely going to get worse.  We will monitor the patient closely. Collaboration of Care:   Patient/Guardian was advised Release of Information must be obtained prior to any record release in order to collaborate their care with an outside provider. Patient/Guardian was advised if they have not already done so to contact the registration department to sign all necessary forms in order for us  to release information regarding their care.   Consent: Patient/Guardian gives verbal consent for treatment and assignment of benefits for services provided during this visit. Patient/Guardian expressed understanding and agreed to proceed.   Elna LILLETTE Lo, MD 7/15/20251:48 PM

## 2023-08-01 ENCOUNTER — Ambulatory Visit: Admitting: Clinical

## 2023-08-01 DIAGNOSIS — F4321 Adjustment disorder with depressed mood: Secondary | ICD-10-CM | POA: Diagnosis not present

## 2023-08-01 NOTE — Progress Notes (Addendum)
 Time: 10:00am-11:00am CPT Code: 09208 Diagnosis: F43.21, Adjustment Disorder with Depression  Raymond Christensen was seen in person. Following verbal review of consent forms, he completed an intake for therapy, and actively participated in creation of a treatment plan. Kaiyden provided input into and verbally consented to all goals and interventions. He is scheduled to be seen again in one week.  Treatment Plan Strengths Client is intelligent, persistent  Preferences No preferences reported  Problems Client is experiencing symptoms of depression, and reports he does not perceive it as depression. He would like to become more dynamic and take control of the depression.  Needs Client would like to be happier within his current circumstances and be able to deal with them more effectively Goals Marquell would like to return to his wonderful, dynamic, adorable, self. He reported that student used to call him "Dr. Joen." He would like to be this way again.  Objective Jachin would like to reduce his fear of becoming like the people he sees using walkers at his retirement community.   Target Date: 07/31/2024 Modality: Individual Therapy Progress: 0 Frequency: Weekly/biweekly  Diagnosis Adjustment disorder with depression Conditions For Discharge Achievement of treatment goals and objectives  Intake Presenting Problem Tomasz presented at the urging of his son and daughter-in-law seeking an individual therapist. He had recently moved into [place]  (one month prior to initial appointment). He reported being upset by the number of people with physical and cognitive challenges. He noted that his wife started using a walker following her 80th birthday. He had previously thought this was temporary, but it has proven not to be. He shared that he has experienced symptoms of depression sine that time.  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 He has worked in conjunction with psychologists in Conservation officer, historic buildings sexuality and other subjects. He has been in therapy in the past, but was disappointed in the experience. He shared that getting out and about and keeping busy has helped him in the past. 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 He shared that retirement has gone relatively well. 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 He been less involved with it in the past three months. In January of 2025 his wife began experiencing physical challenges and requiring a walker. Dementia was suspected at the time, but her cognitive ability has improved since then. Recent neurology tests have shown no improvement. He and his wife have been married for 42. He reported that he and his wife had not planned to move to [place], but did so at the urging of their children. Prior to the move, they were satisfied with their home and routine. He reported that his wife was hesitant at first but is acclimating. They had planned on traveling, and have a trip planned to the [place]. They had to cancel a trip to [place] planned in May that they have had to cancel. A close friend "lost interest in everything and died" two months prior to his initial appointment.  Recent Trigger  Josiah presented at the urging of his family.  Marital and Family Information  Present family concerns/problems: Lauren reported that her mother has bipolar disorder. She maintains a good relationship with  her father, whom other family members suggest may have ASD.  Strengths/resources in the family/friends: Lauren described supportive relationships with her father an aunt.  Marital/sexual history patterns: Tinnie has a  boyfriend.  Family of Origin  Problems in family of origin: He comes from a lower middle class family in the [place]. He grew up in a home with both parents, his aunt, and sister. His father was deployed for the fist three years of his life.  Family background / ethnic factors: None noted No needs/concerns related to ethnicity reported when asked: No  Education/Vocation  Interpersonal concerns/problems: None reported  Personal strengths: Intelligent, gets along well with others   Military/work problems/concerns: None noted.  Leisure Activities/Daily Functioning  impaired functioning  Legal Status  No Legal Problems  Medical/Nutritional Concerns  Seymour has a history of cardiac problems. He underwent his first heart attack at 51. He has gotten stints and bypass surgery. He most recently suffered a heart attack nearly one year prior to his initial intake session.   unspecified  Comments: adult onset tourettes, asthma, POTS  Substance use/abuse/dependence  unspecified   Comments: 3-4 alcoholic drinks per week. No problems reported.    General Behavior: cooperative  Attire: appropriate  Gait: Not observed-telehealth  Motor Activity: normal  Stream of Thought - Productivity: spontaneous  Stream of thought - Progression: normal  Stream of thought - Language: normal  Emotional tone and reactions - Mood: normal  Emotional tone and reactions - Affect: appropriate  Mental trend/Content of thoughts - Perception: normal  Mental trend/Content of thoughts - Orientation: normal  Mental trend/Content of thoughts - Memory: normal  Mental trend/Content of thoughts - General knowledge: consistent with education  Insight: good  Judgment: good        Andriette LITTIE Ponto, PhD

## 2023-08-04 ENCOUNTER — Other Ambulatory Visit: Payer: Self-pay | Admitting: Family Medicine

## 2023-08-04 DIAGNOSIS — E1169 Type 2 diabetes mellitus with other specified complication: Secondary | ICD-10-CM

## 2023-08-07 ENCOUNTER — Ambulatory Visit: Admitting: Clinical

## 2023-08-20 ENCOUNTER — Ambulatory Visit: Admitting: Clinical

## 2023-08-20 DIAGNOSIS — F4321 Adjustment disorder with depressed mood: Secondary | ICD-10-CM | POA: Diagnosis not present

## 2023-08-20 NOTE — Progress Notes (Signed)
 Time: 10:00am-11:00am CPT Code: 09208 Diagnosis: F43.21, Adjustment Disorder with Depression  Raymond Christensen was seen in person. He shared that his wife had reflected concerns for his memory. Therapist engaged him in discussion, and queried whether he may tend to avoid tasks he does not want to do. Session focused on ways Raymond Christensen can access a sense of purpose and control. He is scheduled to be seen again in three weeks.  Treatment Plan Strengths Client is intelligent, persistent  Preferences No preferences reported  Problems Client is experiencing symptoms of depression, and reports he does not perceive it as depression. He would like to become more dynamic and take control of the depression.  Needs Client would like to be happier within his current circumstances and be able to deal with them more effectively Goals Raymond Christensen would like to return to his wonderful, dynamic, adorable, self. He reported that student used to call him "Raymond Christensen." He would like to be this way again.  Objective Raymond Christensen would like to reduce his fear of becoming like the people he sees using walkers at his retirement community.   Target Date: 07/31/2024 Modality: Individual Therapy Progress: 0 Frequency: Weekly/biweekly  Diagnosis Adjustment disorder with depression Conditions For Discharge Achievement of treatment goals and objectives  Intake Presenting Problem Raymond Christensen presented at the urging of his son and daughter-in-law seeking an individual therapist. He had recently moved into [place]  (one month prior to initial appointment). He reported being upset by the number of people with physical and cognitive challenges. He noted that his wife started using a walker following her 81th birthday. He had previously thought this was temporary, but it has proven not to be. He shared that he has experienced symptoms of depression sine that time.  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 He has worked in conjunction with psychologists in Conservation officer, historic buildings sexuality and other subjects. He has been in therapy in the past, but was disappointed in the experience. He shared that getting out and about and keeping busy has helped him in the past. 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 He shared that retirement has gone relatively well. 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 He been less involved with it in the past three months. In January of 2025 his wife began experiencing physical challenges and requiring a walker. Dementia was suspected at the time, but her cognitive ability has improved since then. Recent neurology tests have shown no improvement. He and his wife have been married for 47. He reported that he and his wife had not planned to move to [place], but did so at the urging of their children. Prior to the move, they were satisfied with their home and routine. He reported that his wife was hesitant at first but is acclimating. They had planned on traveling, and have a trip planned to the [place]. They had to cancel a trip to [place] planned in May that they have had to cancel. A close friend "lost interest in everything and died" two months prior to his initial appointment.  Recent Trigger  Raymond Christensen presented at the urging of his family.  Marital and Family Information  Present family concerns/problems: Raymond Christensen reported that  her mother has bipolar disorder. She maintains a good relationship with her father, whom other family members suggest may have ASD.  Strengths/resources in the family/friends: Raymond Christensen described supportive relationships with her father an aunt.  Marital/sexual  history patterns: Raymond Christensen has a boyfriend.  Family of Origin  Problems in family of origin: He comes from a lower middle class family in the [place]. He grew up in a home with both parents, his aunt, and sister. His father was deployed for the fist three years of his life.  Family background / ethnic factors: None noted No needs/concerns related to ethnicity reported when asked: No  Education/Vocation  Interpersonal concerns/problems: None reported  Personal strengths: Intelligent, gets along well with others   Military/work problems/concerns: None noted.  Leisure Activities/Daily Functioning  impaired functioning  Legal Status  No Legal Problems  Medical/Nutritional Concerns  Raymond Christensen has a history of cardiac problems. He underwent his first heart attack at 51. He has gotten stints and bypass surgery. He most recently suffered a heart attack nearly one year prior to his initial intake session.   unspecified  Comments: adult onset tourettes, asthma, POTS  Substance use/abuse/dependence  unspecified   Comments: 3-4 alcoholic drinks per week. No problems reported.    General Behavior: cooperative  Attire: appropriate  Gait: Not observed-telehealth  Motor Activity: normal  Stream of Thought - Productivity: spontaneous  Stream of thought - Progression: normal  Stream of thought - Language: normal  Emotional tone and reactions - Mood: normal  Emotional tone and reactions - Affect: appropriate  Mental trend/Content of thoughts - Perception: normal  Mental trend/Content of thoughts - Orientation: normal  Mental trend/Content of thoughts - Memory: normal  Mental trend/Content of thoughts - General knowledge: consistent with education  Insight: good  Judgment: good        Raymond LITTIE Ponto, PhD               Raymond LITTIE Ponto, PhD

## 2023-08-27 ENCOUNTER — Other Ambulatory Visit: Payer: Self-pay | Admitting: Family Medicine

## 2023-08-27 ENCOUNTER — Ambulatory Visit: Payer: Self-pay

## 2023-08-27 NOTE — Telephone Encounter (Signed)
 FYI Only or Action Required?: FYI only for provider.  Patient was last seen in primary care on 06/24/2023 by Raymond Mabel Mt, DO.  Called Nurse Triage reporting Joint Swelling.  Symptoms began a week ago.  Interventions attempted: Rest, hydration, or home remedies. Elevation  Symptoms are: gradually worsening.  Triage Disposition: See PCP When Office is Open (Within 3 Days)  Patient/caregiver understands and will follow disposition?: Yes, but will wait- pt refused scheduling with providers with earlier availability prefers to see PCP. Went ahead and scheduled for 8/20 at 0930  Copied from CRM [id number]. Topic: Clinical - Red Word Triage >> Aug 27, 2023  3:02 PM Suzen RAMAN wrote: Red Word that prompted transfer to Nurse Triage: increased swelling in left ankle Reason for Disposition  MODERATE ankle swelling (e.g., interferes with normal activities, can't move joint normally) (Exceptions: Itchy, localized swelling; swelling is chronic.)  Answer Assessment - Initial Assessment Questions 1. LOCATION: Which ankle is swollen? Where is the swelling?     Left ankle  2. ONSET: When did the swelling start?     1 week  3. SWELLING: How bad is the swelling? Or, How large is it? (e.g., mild, moderate, severe; size of localized swelling)      Mild to moderate  4. PAIN: Is there any pain? If Yes, ask: How bad is it? (Scale 0-10; or none, mild, moderate, severe)     No  5. CAUSE: What do you think caused the ankle swelling?     Unsure of cause  6. OTHER SYMPTOMS: Do you have any other symptoms? (e.g., fever, chest pain, difficulty breathing, calf pain)     No  Protocols used: Ankle Swelling-A-AH

## 2023-09-03 ENCOUNTER — Encounter: Payer: Self-pay | Admitting: Family Medicine

## 2023-09-03 ENCOUNTER — Ambulatory Visit: Admitting: Family Medicine

## 2023-09-03 VITALS — BP 124/64 | HR 76 | Temp 97.7°F | Resp 16 | Ht 68.0 in | Wt 199.2 lb

## 2023-09-03 DIAGNOSIS — M7989 Other specified soft tissue disorders: Secondary | ICD-10-CM

## 2023-09-03 DIAGNOSIS — F411 Generalized anxiety disorder: Secondary | ICD-10-CM | POA: Diagnosis not present

## 2023-09-03 DIAGNOSIS — E039 Hypothyroidism, unspecified: Secondary | ICD-10-CM | POA: Diagnosis not present

## 2023-09-03 DIAGNOSIS — I5022 Chronic systolic (congestive) heart failure: Secondary | ICD-10-CM | POA: Diagnosis not present

## 2023-09-03 NOTE — Patient Instructions (Signed)
 For the swelling in your lower extremities, be sure to elevate your legs when able, mind the salt intake, stay physically active and consider wearing compression stockings.  Keep the diet clean and stay active.  Thyroid  meds are OK.  There are no med changes we can safely make at this time.  I expect, to some extent, your energy and mood will improve after the move is complete.  Let us  know if you need anything.

## 2023-09-03 NOTE — Progress Notes (Signed)
 Chief Complaint  Patient presents with   Joint Swelling    Ankle Swelling    Raymond Christensen here for L leg swelling.  He is here with his spouse.  Duration: 3 weeks Hx of prolonged bedrest, recent surgery, travel? No He has been hauling boxes after a recent move.  He is living in an assisted living facility at [facility].  The food there is saltier than what he would prepare at home. Pulm. Pain the calf? No SOB? No No weight changes.  Personal or family history of clot or bleeding disorder? No; he is on Eliquis  for A fib and reports compliance Hx of heart failure, renal failure, hepatic failure? Yes, HFrEF  Patient is wondering if the fatigue he is experiencing is related to any medicine he is on.  He is on metoprolol  12.5 mg daily, Paxil  10 mg daily, and lorazepam  0.5 mg twice daily.  He is finally working with a therapist who he gets along with.  Fatigue worsened a few weeks ago when he started the booking process.  He has associated irritability and anger.  Patient also brought up his hypothyroidism diagnosis.  He was diagnosed many years ago in [place] .  He is unsure if he actually has low thyroid  in his mind.  Recent readings show free T4 in the lower end of the normal range.  He reports compliance with his levothyroxine  75 mcg daily, Cytomel  10 mcg daily.  Past Medical History:  Diagnosis Date   Arthritis    CAD (coronary artery disease)    Cancer (HCC)    skin lt. arm.   Carotid artery disease (HCC)    s/p left CEA 01/17/14   Chronic systolic CHF (congestive heart failure) [facility])    Concussion Summer 2012   Essential hypertension 02/26/2017   Heart disease    Hyperlipidemia    Hypothyroidism 10/15/2017   Major depressive disorder    Mitral valve regurgitation    Myocardial infarction (HCC) 1996, 2021   Obstructive sleep apnea on CPAP    Persistent atrial fibrillation (HCC)    Poor flexibility of tendon 12/01/2018   S/P mitral valve clip implantation 12/23/2019   s/p  TEER with MitraClip with one XTW by Dr. Wonda   Subungual hematoma of digit of hand 12/01/2018   Subungual hematoma of right foot 12/01/2018   Type 2 diabetes mellitus without complication, without long-term current use of insulin  (HCC) 02/26/2017   Family History  Problem Relation Age of Onset   Dementia Father        Concerns surrounding Lewy body dementia, but never officially diagnosed   Cancer Neg Hx    Past Surgical History:  Procedure Laterality Date   ATRIAL FIBRILLATION ABLATION N/A 02/01/2020   Procedure: ATRIAL FIBRILLATION ABLATION;  Surgeon: Inocencio Soyla Lunger, MD;  Location: [facility] INVASIVE CV LAB;  Service: Cardiovascular;  Laterality: N/A;   BYPASS GRAFT  2001   CARDIAC CATHETERIZATION     CARDIOVERSION N/A 11/08/2019   Procedure: CARDIOVERSION;  Surgeon: Delford Maude BROCKS, MD;  Location: [facility] ENDOSCOPY;  Service: Cardiovascular;  Laterality: N/A;   CAROTID ENDARTERECTOMY Left 01/17/2014   CORONARY ANGIOPLASTY     CORONARY ARTERY BYPASS GRAFT     LEFT HEART CATH AND CORS/GRAFTS ANGIOGRAPHY N/A 06/26/2022   Procedure: LEFT HEART CATH AND CORS/GRAFTS ANGIOGRAPHY;  Surgeon: Burnard Debby LABOR, MD;  Location: [facility] INVASIVE CV LAB;  Service: Cardiovascular;  Laterality: N/A;   RIGHT/LEFT HEART CATH AND CORONARY/GRAFT ANGIOGRAPHY N/A 09/09/2019   Procedure: RIGHT/LEFT HEART  CATH AND CORONARY/GRAFT ANGIOGRAPHY;  Surgeon: Court Dorn PARAS, MD;  Location: [facility] INVASIVE CV LAB;  Service: Cardiovascular;  Laterality: N/A;   TEE WITHOUT CARDIOVERSION N/A 09/13/2019   Procedure: TRANSESOPHAGEAL ECHOCARDIOGRAM (TEE);  Surgeon: Lonni Slain, MD;  Location: [facility] ENDOSCOPY;  Service: Cardiovascular;  Laterality: N/A;   TEE WITHOUT CARDIOVERSION N/A 12/23/2019   Procedure: TRANSESOPHAGEAL ECHOCARDIOGRAM (TEE);  Surgeon: Wonda Sharper, MD;  Location: [facility] INVASIVE CV LAB;  Service: Cardiovascular;  Laterality: N/A;   TEE WITHOUT CARDIOVERSION  02/01/2020   Procedure: TRANSESOPHAGEAL  ECHOCARDIOGRAM (TEE);  Surgeon: Inocencio Soyla Lunger, MD;  Location: [facility] INVASIVE CV LAB;  Service: Cardiovascular;;   TOTAL HIP ARTHROPLASTY  1994, 1995   TOTAL HIP ARTHROPLASTY Bilateral    TRANSCATHETER MITRAL EDGE TO EDGE REPAIR N/A 12/23/2019   Procedure: MITRAL VALVE REPAIR;  Surgeon: Wonda Sharper, MD;  Location: [facility] INVASIVE CV LAB;  Service: Cardiovascular;  Laterality: N/A;   TRANSURETHRAL RESECTION OF PROSTATE N/A 12/27/2022   Procedure: TRANSURETHRAL RESECTION OF THE PROSTATE (TURP);  Surgeon: Nieves Cough, MD;  Location: [facility] ORS;  Service: Urology;  Laterality: N/A;  75 minute case    Current Outpatient Medications:    acetaminophen  (TYLENOL ) 500 MG tablet, Take 1,000 mg by mouth every 6 (six) hours as needed for moderate pain or headache., Disp: , Rfl:    atorvastatin  (LIPITOR ) 80 MG tablet, Take 1 tablet (80 mg total) by mouth daily., Disp: 90 tablet, Rfl: 3   Cholecalciferol  (VITAMIN D3) 250 MCG (10000 UT) capsule, Take 10,000 Units by mouth daily. , Disp: , Rfl:    ELIQUIS  5 MG TABS tablet, TAKE 1 TABLET BY MOUTH TWICE  DAILY, Disp: 180 tablet, Rfl: 3   ezetimibe  (ZETIA ) 10 MG tablet, Take 1 tablet (10 mg total) by mouth daily., Disp: 90 tablet, Rfl: 3   famotidine  (PEPCID ) 20 MG tablet, Take 1 tablet (20 mg total) by mouth daily., Disp: 30 tablet, Rfl: 3   finasteride  (PROSCAR ) 5 MG tablet, Take 1 tablet (5 mg total) by mouth daily., Disp: 90 tablet, Rfl: 3   fluticasone  (FLONASE ) 50 MCG/ACT nasal spray, Place 2 sprays into both nostrils daily., Disp: 16 g, Rfl: 6   levothyroxine  (SYNTHROID ) 75 MCG tablet, Take 1 tablet (75 mcg total) by mouth daily., Disp: 90 tablet, Rfl: 1   liothyronine  (CYTOMEL ) 5 MCG tablet, TAKE 2 TABLETS BY MOUTH DAILY, Disp: 180 tablet, Rfl: 1   LORazepam  (ATIVAN ) 0.5 MG tablet, 1 bid, Disp: 60 tablet, Rfl: 4   metoprolol  succinate (TOPROL -XL) 25 MG 24 hr tablet, Take 0.5 tablets (12.5 mg total) by mouth daily., Disp: 45 tablet, Rfl: 3   Multiple  Vitamins-Minerals (MULTIVITAMIN ADULT EXTRA C PO), Take 1 tablet by mouth daily. , Disp: , Rfl:    nitroGLYCERIN  (NITROSTAT ) 0.4 MG SL tablet, Place 1 tablet (0.4 mg total) under the tongue every 5 (five) minutes as needed for chest pain., Disp: 30 tablet, Rfl: 12   OZEMPIC , 2 MG/DOSE, 8 MG/3ML SOPN, INJECT SUBCUTANEOUSLY 2 MG EVERY WEEK ON SUNDAY AS DIRECTED, Disp: 9 mL, Rfl: 3   PARoxetine  (PAXIL ) 10 MG tablet, Take 1 tablet (10 mg total) by mouth daily., Disp: 90 tablet, Rfl: 0  BP 124/64 (BP Location: Left Arm, Patient Position: Sitting)   Pulse 76   Temp 97.7 F (36.5 C) (Oral)   Resp 16   Ht 5' 8 (1.727 m)   Wt 199 lb 3.2 oz (90.4 kg)   SpO2 98%   BMI 30.29 kg/m  Gen- awake, alert,  appears stated age Heart- RRR, no murmurs, 2+ pitting bilateral LE edema tapering at the knees Lungs- CTAB, normal effort w/o accessory muscle use MSK-no calf pain Psych: Age appropriate judgment and insight  Localized swelling of both lower extremities  Hypothyroidism, unspecified type  Chronic HFrEF  This may be more related to dependent edema.  Recommended compression stockings over-the-counter strength or knee-high socks to help with this.  Mind salt intake.  Elevate legs.  Stay active.  Send message in 1 to 2 weeks if no improvement.  DVT extremely unlikely given lack of tenderness over the calfs and compliance with Eliquis . Encouraged him that he is likely optimized on his current dosage of thyroid  replacement.  We went over his most recent lab readings.  He is okay to continue taking this. He is on the lowest dose of metoprolol  which I would recommend him staying on.  There is no weight change and he is not having any dyspnea on exertion.  Exam is also unremarkable for heart failure exacerbation. Appreciate psychiatry input.  Continue the therapy team.  Continue Paxil  10 mg daily.  Lorazepam  could be causing fatigue.  I think he will be better once he gets further away from his move F/u as  originally scheduled. Pt and his spouse voiced understanding and agreement to the plan.  I spent 42 minutes with the patient discussing the above plans in addition to reviewing the chart on the same date of visit.  Mabel Mt [place], DO 09/03/23  10:32 AM

## 2023-09-04 ENCOUNTER — Other Ambulatory Visit: Payer: Self-pay | Admitting: Family Medicine

## 2023-09-04 DIAGNOSIS — F411 Generalized anxiety disorder: Secondary | ICD-10-CM

## 2023-09-09 ENCOUNTER — Ambulatory Visit (INDEPENDENT_AMBULATORY_CARE_PROVIDER_SITE_OTHER): Admitting: Clinical

## 2023-09-09 DIAGNOSIS — F4321 Adjustment disorder with depressed mood: Secondary | ICD-10-CM

## 2023-09-09 NOTE — Progress Notes (Signed)
 Time: 3:00 pm-4:00pm CPT Code: 09162E Diagnosis: F43.21, Adjustment Disorder with Depression  Maison was seen in person. He shared ongoing thoughts about aging, noting that his wife had remarked upon continued memory loss in recent weeks. Therapist processed this with him, encouraging to reflect upon how he had pictured this period of his life, and how he might want his next chapter to play out. He is scheduled to be seen again in three weeks.  Treatment Plan Strengths Client is intelligent, persistent  Preferences No preferences reported  Problems Client is experiencing symptoms of depression, and reports he does not perceive it as depression. He would like to become more dynamic and take control of the depression.  Needs Client would like to be happier within his current circumstances and be able to deal with them more effectively Goals Aarnav would like to return to his wonderful, dynamic, adorable, self. He reported that student used to call him "Dr. Joen." He would like to be this way again.  Objective Fredis would like to reduce his fear of becoming like the people he sees using walkers at his retirement community.   Target Date: 07/31/2024 Modality: Individual Therapy Progress: 0 Frequency: Weekly/biweekly  Diagnosis Adjustment disorder with depression Conditions For Discharge Achievement of treatment goals and objectives  Intake Presenting Problem Daniyal presented at the urging of his son and daughter-in-law seeking an individual therapist. He had recently moved into [place]  (one month prior to initial appointment). He reported being upset by the number of people with physical and cognitive challenges. He noted that his wife started using a walker following her 81th birthday. He had previously thought this was temporary, but it has proven not to be. He shared that he has experienced symptoms of depression sine that time.  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 He has worked in conjunction with psychologists in Conservation officer, historic buildings sexuality and other subjects. He has been in therapy in the past, but was disappointed in the experience. He shared that getting out and about and keeping busy has helped him in the past. 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 He shared that retirement has gone relatively well. 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 He been less involved with it in the past three months. In January of 2025 his wife began experiencing physical challenges and requiring a walker. Dementia was suspected at the time, but her cognitive ability has improved since then. Recent neurology tests have shown no improvement. He and his wife have been married for 25. He reported that he and his wife had not planned to move to [place], but did so at the urging of their children. Prior to the move, they were satisfied with their home and routine. He reported that his wife was hesitant at first but is acclimating. They had planned on traveling, and have a trip planned to the [place]. They had to cancel a trip to [place] planned in May that they have had to cancel. A close friend "lost interest in everything and died" two months prior to his initial appointment.  Recent Trigger  Isaia presented at the urging of his family.  Marital and Family Information  Present  family concerns/problems: Lauren reported that her mother has bipolar disorder. She maintains a good relationship with her father, whom other family members suggest may have ASD.  Strengths/resources in the family/friends: Lauren described supportive relationships with her father  an aunt.  Marital/sexual history patterns: Tinnie has a boyfriend.  Family of Origin  Problems in family of origin: He comes from a lower middle class family in the [place]. He grew up in a home with both parents, his aunt, and sister. His father was deployed for the fist three years of his life.  Family background / ethnic factors: None noted No needs/concerns related to ethnicity reported when asked: No  Education/Vocation  Interpersonal concerns/problems: None reported  Personal strengths: Intelligent, gets along well with others   Military/work problems/concerns: None noted.  Leisure Activities/Daily Functioning  impaired functioning  Legal Status  No Legal Problems  Medical/Nutritional Concerns  Unknown has a history of cardiac problems. He underwent his first heart attack at 51. He has gotten stints and bypass surgery. He most recently suffered a heart attack nearly one year prior to his initial intake session.   unspecified  Comments: adult onset tourettes, asthma, POTS  Substance use/abuse/dependence  unspecified   Comments: 3-4 alcoholic drinks per week. No problems reported.    General Behavior: cooperative  Attire: appropriate  Gait: Not observed-telehealth  Motor Activity: normal  Stream of Thought - Productivity: spontaneous  Stream of thought - Progression: normal  Stream of thought - Language: normal  Emotional tone and reactions - Mood: normal  Emotional tone and reactions - Affect: appropriate  Mental trend/Content of thoughts - Perception: normal  Mental trend/Content of thoughts - Orientation: normal  Mental trend/Content of thoughts - Memory: normal  Mental trend/Content of thoughts - General knowledge: consistent with education  Insight: good  Judgment: good      Andriette LITTIE Ponto, PhD               Andriette LITTIE Ponto, PhD

## 2023-09-16 ENCOUNTER — Ambulatory Visit (HOSPITAL_COMMUNITY)
Admission: RE | Admit: 2023-09-16 | Discharge: 2023-09-16 | Disposition: A | Discharge status: Home / Self Care | Payer: Medicare (Managed Care) | Source: Ambulatory Visit | Attending: Internal Medicine | Admitting: Internal Medicine

## 2023-09-16 DIAGNOSIS — I480 Paroxysmal atrial fibrillation: Secondary | ICD-10-CM | POA: Insufficient documentation

## 2023-09-16 DIAGNOSIS — Z95818 Presence of other cardiac implants and grafts: Secondary | ICD-10-CM | POA: Diagnosis not present

## 2023-09-16 DIAGNOSIS — Z9889 Other specified postprocedural states: Secondary | ICD-10-CM | POA: Insufficient documentation

## 2023-09-16 LAB — ECHOCARDIOGRAM COMPLETE
Area-P 1/2: 3.95 cm2
Est EF: 45
MV M vel: 5.17 m/s
MV Peak grad: 106.9 mmHg
MV VTI: 1.96 cm2
S' Lateral: 4.1 cm

## 2023-09-22 ENCOUNTER — Telehealth: Payer: Self-pay

## 2023-09-22 NOTE — Telephone Encounter (Signed)
 Copied from CRM [id number]. Topic: General - Other >> Sep 22, 2023 10:22 AM [place] A wrote: Reason for CRM: Patient called for Covid and RSV vaccine- Agent read KMS to verify those were not offered. Agent called [employer] to ask about the Pharmacy are informing patients to call Primary for Prescription for Covid. CAL said that is correct but the government has a hold on that. Agent informed Patient of this information. Patient told Agent to talk to your government

## 2023-09-22 NOTE — Telephone Encounter (Signed)
  Called pt was advised we don't give orders for Vaccine at this time per our office. Pt stated understand.  Copied from CRM [id number]. Topic: Clinical - Medical Advice >> Sep 22, 2023 10:35 AM Henretta I wrote: Reason for CRM: Patient needs scripts sent to pharmacy for rsv and covid boosters sent to [facility] [id number] [place], [place] - [address] [facility] [address] [place] [address] Phone: [phone] Fax: [phone] Hours: Not open 24 hours

## 2023-09-23 ENCOUNTER — Ambulatory Visit: Admitting: Clinical

## 2023-09-24 ENCOUNTER — Other Ambulatory Visit (HOSPITAL_BASED_OUTPATIENT_CLINIC_OR_DEPARTMENT_OTHER): Payer: Self-pay

## 2023-09-24 MED ORDER — RSVPREF3 VAC RECOMB ADJUVANTED 120 MCG/0.5ML IM SUSR
0.5000 mL | Freq: Once | INTRAMUSCULAR | 0 refills | Status: AC
  Filled 2023-09-24: qty 0.5, 1d supply, fill #0

## 2023-09-26 ENCOUNTER — Ambulatory Visit: Payer: Self-pay

## 2023-09-26 ENCOUNTER — Ambulatory Visit (INDEPENDENT_AMBULATORY_CARE_PROVIDER_SITE_OTHER): Admitting: Clinical

## 2023-09-26 DIAGNOSIS — F4321 Adjustment disorder with depressed mood: Secondary | ICD-10-CM

## 2023-09-26 NOTE — Progress Notes (Signed)
 Time: 11:00 am-11:58 am CPT Code: 09162E Diagnosis: F43.21, Adjustment Disorder with Depression  Xzander was seen in person. He reflected upon a situation where he had lashed out at his wife. Through further processing, he connected this to having learned of the death of a friend. He has been making effort to get involved at [employer], and had emailed several friends. He is scheduled to be seen again in one month.  Treatment Plan Strengths Client is intelligent, persistent  Preferences No preferences reported  Problems Client is experiencing symptoms of depression, and reports he does not perceive it as depression. He would like to become more dynamic and take control of the depression.  Needs Client would like to be happier within his current circumstances and be able to deal with them more effectively Goals Kao would like to return to his wonderful, dynamic, adorable, self. He reported that student used to call him "Dr. Joen." He would like to be this way again.  Objective Terral would like to reduce his fear of becoming like the people he sees using walkers at his retirement community.   Target Date: 07/31/2024 Modality: Individual Therapy Progress: 0 Frequency: Weekly/biweekly  Diagnosis Adjustment disorder with depression Conditions For Discharge Achievement of treatment goals and objectives  Intake Presenting Problem Turki presented at the urging of his son and daughter-in-law seeking an individual therapist. He had recently moved into [place]  (one month prior to initial appointment). He reported being upset by the number of people with physical and cognitive challenges. He noted that his wife started using a walker following her 80th birthday. He had previously thought this was temporary, but it has proven not to be. He shared that he has experienced symptoms of depression sine that time.  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 He has worked in conjunction with psychologists in Conservation officer, historic buildings sexuality and other subjects. He has been in therapy in the past, but was disappointed in the experience. He shared that getting out and about and keeping busy has helped him in the past. 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 He shared that retirement has gone relatively well. 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 He been less involved with it in the past three months. In January of 2025 his wife began experiencing physical challenges and requiring a walker. Dementia was suspected at the time, but her cognitive ability has improved since then. Recent neurology tests have shown no improvement. He and his wife have been married for 60. He reported that he and his wife had not planned to move to [place], but did so at the urging of their children. Prior to the move, they were satisfied with their home and routine. He reported that his wife was hesitant at first but is acclimating. They had planned on traveling, and have a trip planned to the [place]. They had to cancel a trip to [place] planned in May that they have had to cancel. A close friend "lost interest in everything and died" two months prior to his initial appointment.  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  Marital and Family Information  Present family concerns/problems: Lauren reported  that her mother has bipolar disorder. She maintains a good relationship with her father, whom other family members suggest may have ASD.  Strengths/resources in the family/friends: Lauren described supportive relationships with her father an aunt.  Marital/sexual  history patterns: Tinnie has a boyfriend.  Family of Origin  Problems in family of origin: He comes from a lower middle class family in the [place]. He grew up in a home with both parents, his aunt, and sister. His father was deployed for the fist three years of his life.  Family background / ethnic factors: None noted No needs/concerns related to ethnicity reported when asked: No  Education/Vocation  Interpersonal concerns/problems: None reported  Personal strengths: Intelligent, gets along well with others   Military/work problems/concerns: None noted.  Leisure Activities/Daily Functioning  impaired functioning  Legal Status  No Legal Problems  Medical/Nutritional Concerns  Weylin has a history of cardiac problems. He underwent his first heart attack at 51. He has gotten stints and bypass surgery. He most recently suffered a heart attack nearly one year prior to his initial intake session.   unspecified  Comments: adult onset tourettes, asthma, POTS  Substance use/abuse/dependence  unspecified   Comments: 3-4 alcoholic drinks per week. No problems reported.    General Behavior: cooperative  Attire: appropriate  Gait: Not observed-telehealth  Motor Activity: normal  Stream of Thought - Productivity: spontaneous  Stream of thought - Progression: normal  Stream of thought - Language: normal  Emotional tone and reactions - Mood: normal  Emotional tone and reactions - Affect: appropriate  Mental trend/Content of thoughts - Perception: normal  Mental trend/Content of thoughts - Orientation: normal  Mental trend/Content of thoughts - Memory: normal  Mental trend/Content of thoughts - General knowledge: consistent with education  Insight: good  Judgment: good         Andriette LITTIE Ponto, PhD               Andriette LITTIE Ponto, PhD

## 2023-09-30 ENCOUNTER — Ambulatory Visit: Admitting: Clinical

## 2023-10-06 ENCOUNTER — Ambulatory Visit: Payer: Self-pay

## 2023-10-06 NOTE — Telephone Encounter (Signed)
 FYI Only or Action Required?: FYI only for provider.  Patient was last seen in primary care on 09/03/2023 by Frann Mabel Mt, DO.  Called Nurse Triage reporting Leg Swelling.  Symptoms began several days ago.  Interventions attempted: Rest, hydration, or home remedies.  Symptoms are: unchanged.  Triage Disposition: See Physician Within 24 Hours  Patient/caregiver understands and will follow disposition?: Yes   Copied from CRM #[id number]. Topic: Clinical - Red Word Triage >> Oct 06, 2023  9:57 AM Larissa RAMAN wrote: [facility] that prompted transfer to Nurse Triage: rt leg swelling   ----------------------------------------------------------------------- From previous Reason for Contact - Scheduling: Patient/patient representative is calling to schedule an appointment. Refer to attachments for appointment information. Reason for Disposition  Looks like a boil, infected sore, deep ulcer or other infected rash (spreading redness, pus)  Answer Assessment - Initial Assessment Questions Additional info: Patient called in to request appointment with pcp only on Tuesday-Scheduled.  Fyi; patient giving limited information.    1. ONSET: When did the swelling start? (e.g., minutes, hours, days)     Few days  2. LOCATION: What part of the leg is swollen?  Are both legs swollen or just one leg?     Right leg  3. SEVERITY: How bad is the swelling? (e.g., localized; mild, moderate, severe)     mild 4. REDNESS: Is there redness or signs of infection?     Yes 5. PAIN: Is the swelling painful to touch? If Yes, ask: How painful is it?   (Scale 1-10; mild, moderate or severe)     tender 6. FEVER: Do you have a fever? If Yes, ask: What is it, how was it measured, and when did it start?      denies 7. CAUSE: What do you think is causing the leg swelling?     infection 8. MEDICAL HISTORY: Do you have a history of blood clots (e.g., DVT), cancer, heart failure, kidney  disease, or liver failure?      9. RECURRENT SYMPTOM: Have you had leg swelling before? If Yes, ask: When was the last time? What happened that time?      10. OTHER SYMPTOMS: Do you have any other symptoms? (e.g., chest pain, difficulty breathing)       Denies but states 'looks like pus'  Protocols used: Leg Swelling and Edema-A-AH

## 2023-10-07 ENCOUNTER — Ambulatory Visit: Admitting: Family Medicine

## 2023-10-07 ENCOUNTER — Other Ambulatory Visit (HOSPITAL_BASED_OUTPATIENT_CLINIC_OR_DEPARTMENT_OTHER): Payer: Self-pay

## 2023-10-07 ENCOUNTER — Encounter: Payer: Self-pay | Admitting: Family Medicine

## 2023-10-07 VITALS — BP 136/60 | HR 71 | Temp 98.1°F | Resp 18 | Ht 68.0 in | Wt 201.0 lb

## 2023-10-07 DIAGNOSIS — S81801A Unspecified open wound, right lower leg, initial encounter: Secondary | ICD-10-CM | POA: Diagnosis not present

## 2023-10-07 DIAGNOSIS — L089 Local infection of the skin and subcutaneous tissue, unspecified: Secondary | ICD-10-CM | POA: Diagnosis not present

## 2023-10-07 MED ORDER — DOXYCYCLINE HYCLATE 100 MG PO TABS: 100.0000 mg | ORAL_TABLET | Freq: Two times a day (BID) | ORAL | 0 refills | Status: AC

## 2023-10-07 MED ORDER — COMIRNATY 30 MCG/0.3ML IM SUSY
0.3000 mL | PREFILLED_SYRINGE | Freq: Once | INTRAMUSCULAR | 0 refills | Status: AC
  Filled 2023-10-07: qty 0.3, 1d supply, fill #0

## 2023-10-07 NOTE — Patient Instructions (Signed)
 I don't expect this to improve until you are done with the topical treatment.   When you do wash it, use only soap and water. Do not vigorously scrub. Apply triple antibiotic ointment (like Neosporin) twice daily. Keep the area clean and dry.   Things to look out for: increasing pain not relieved by acetaminophen , fevers, spreading redness, drainage of pus, or foul odor.  Let us  know if you need anything.

## 2023-10-07 NOTE — Progress Notes (Signed)
 Chief Complaint  Patient presents with   Edema    Both legs     Raymond Christensen is a 83 y.o. male here for a skin complaint.  Duration: 1 week Location: back of RLE Pruritic? No Painful? Yes Drainage? Yes- pus-like drainage New soaps/lotions/topicals/detergents? Yes- has been using 5-FU over area for a skin cancer for past 1.5 weeks.  Trauma? No Other associated symptoms: no fevers; chronic LE swelling Therapies tried thus far: none  Past Medical History:  Diagnosis Date   Arthritis    CAD (coronary artery disease)    Cancer (HCC)    skin lt. arm.   Carotid artery disease    s/p left CEA 01/17/14   Chronic systolic CHF (congestive heart failure) (HCC)    Concussion Summer 2012   Essential hypertension 02/26/2017   Heart disease    Hyperlipidemia    Hypothyroidism 10/15/2017   Major depressive disorder    Mitral valve regurgitation    Myocardial infarction [facility]) 1996, 2021   Obstructive sleep apnea on CPAP    Persistent atrial fibrillation (HCC)    Poor flexibility of tendon 12/01/2018   S/P mitral valve clip implantation 12/23/2019   s/p TEER with MitraClip with one XTW by Dr. Wonda   Subungual hematoma of digit of hand 12/01/2018   Subungual hematoma of right foot 12/01/2018   Type 2 diabetes mellitus without complication, without long-term current use of insulin  (HCC) 02/26/2017    BP 136/60   Pulse 71   Temp 98.1 F (36.7 C)   Resp 18   Ht 5' 8 (1.727 m)   Wt 201 lb (91.2 kg)   SpO2 98%   BMI 30.56 kg/m  Gen: awake, alert, appearing stated age Lungs: No accessory muscle use Skin: See below. +warmth and TTP. No drainage, fluctuance, scaling Psych: Age appropriate judgment and insight    Wound of right lower extremity, initial encounter  Skin infection  Could be having the initial symptoms of a skin infection.  Will treat with 7 days of doxycycline .  Keep the area clean and dry.  No vigorous scrubbing. Warning signs and symptoms verbalized and written  down in AVS. The wound was redressed prior to the patient leaving. F/u prn. The patient voiced understanding and agreement to the plan.  Mabel Mt [place], DO 10/07/23 4:01 PM

## 2023-10-08 ENCOUNTER — Encounter: Payer: Self-pay | Admitting: Family Medicine

## 2023-10-08 ENCOUNTER — Other Ambulatory Visit: Payer: Self-pay | Admitting: Family Medicine

## 2023-10-08 DIAGNOSIS — J310 Chronic rhinitis: Secondary | ICD-10-CM

## 2023-10-08 MED ORDER — FLUTICASONE PROPIONATE 50 MCG/ACT NA SUSP: 2.0000 | Freq: Every day | NASAL | 6 refills | Status: DC

## 2023-10-10 ENCOUNTER — Encounter: Payer: Self-pay | Admitting: Internal Medicine

## 2023-10-10 ENCOUNTER — Other Ambulatory Visit (HOSPITAL_COMMUNITY): Payer: Self-pay

## 2023-10-10 ENCOUNTER — Ambulatory Visit: Attending: Internal Medicine | Admitting: Internal Medicine

## 2023-10-10 VITALS — BP 124/62 | HR 69 | Ht 68.0 in | Wt 198.2 lb

## 2023-10-10 DIAGNOSIS — I480 Paroxysmal atrial fibrillation: Secondary | ICD-10-CM

## 2023-10-10 DIAGNOSIS — I5022 Chronic systolic (congestive) heart failure: Secondary | ICD-10-CM | POA: Diagnosis not present

## 2023-10-10 DIAGNOSIS — M7989 Other specified soft tissue disorders: Secondary | ICD-10-CM

## 2023-10-10 MED ORDER — FUROSEMIDE 20 MG PO TABS
20.0000 mg | ORAL_TABLET | Freq: Every day | ORAL | 11 refills | Status: DC | PRN
  Filled 2023-10-10: qty 30, 30d supply, fill #0

## 2023-10-10 NOTE — Progress Notes (Signed)
 Cardiology Office Note:  .    Date:  10/10/2023  ID:  Raymond Christensen, DOB 1940/03/14, MRN [id number] PCP: Frann Mabel Mt, DO  [facility] Providers Cardiologist:  Stanly DELENA Leavens, MD Electrophysiologist:  Soyla Gladis Norton, MD     CC: Follow up Bleeding  History of Present Illness: .    Raymond Christensen is a 83 y.o. male with CAD and prior graft failure in 2024, MR s/p MitraClip, bleeding after TURP and PAF.  Raymond Christensen is an 83 year old male with heart failure with reduced ejection fraction who presents with increased symptom burden.  He has a history of heart failure with reduced ejection fraction, currently stable at 40-45%. He underwent a mitral valve clip procedure in 2022. A recent echocardiogram in September was performed to evaluate his mitral regurgitation and heart function.  He experiences easy fatigue and new swelling in his ankles and legs over the past month. He has recently moved to a smaller apartment, which has involved significant physical activity. No chest pain and no significant breathing difficulties. He remains reasonably active despite the recent swelling in his legs.  He has a history of coronary artery disease and atrial fibrillation. He is currently on atorvastatin  for coronary artery disease and Eliquis  for atrial fibrillation. He experienced hematuria in the past but reports no further episodes. He has not experienced any new episodes of atrial fibrillation since his last ablation.  His current medications include metoprolol  succinate for heart failure and Eliquis  for atrial fibrillation.   Relevant histories: .  Social married; wife comes to visits; former Dr. [name] patient. - Living Situation: Moved from own home to a smaller apartment, currently living in [place].  I now see his Wife; they are going to [place] this October. ROS: As per HPI.   Studies Reviewed: .   Cardiac Studies & Procedures    ______________________________________________________________________________________________ CARDIAC CATHETERIZATION  CARDIAC CATHETERIZATION 06/26/2022  Conclusion   Mid LM to Dist LM lesion is 70% stenosed.   Mid RCA to Dist RCA lesion is 100% stenosed.   Prox RCA to Mid RCA lesion is 90% stenosed.   2nd Diag lesion is 75% stenosed.   1st Mrg lesion is 75% stenosed.   2nd Mrg-1 lesion is 80% stenosed.   1st Diag lesion is 40% stenosed.   Mid LAD lesion is 75% stenosed.   2nd Mrg-2 lesion is 100% stenosed.   Prox Graft to Mid Graft lesion before 2nd Mrg  is 95% stenosed.   Dist Graft to Insertion lesion before 2nd Mrg  is 85% stenosed.   Prox Graft to Mid Graft lesion between 2nd Mrg and RPDA  is 100% stenosed.   Dist Graft to Insertion lesion between 2nd Mrg and RPDA  is 100% stenosed.   There is moderate left ventricular systolic dysfunction.   LV end diastolic pressure is mildly elevated.   The left ventricular ejection fraction is 35-45% by visual estimate.   Recommend to resume Apixaban , at currently prescribed dose and frequency.   Recommend concurrent antiplatelet therapy of Aspirin  81 mg daily.  Severe multivessel CAD with 70% eccentric distal left main stenosis; 40% stenosis in the first diagonal vessel, 75 to 80% stenosis in the LAD between the first and second diagonal vessel with diffuse 70% stenosis in the second diagonal vessel.  There is competitive filling of the mid LAD due to LIMA graft flow.  Diffuse irregularity of the left circumflex vessel with 20% proximal stenosis, 70% ostial stenosis in the first  marginal branch, small second marginal branch with mid occlusion, and 50% mid AV groove circumflex stenosis.  Previously noted high-grade mid RCA stenosis with total occlusion before the acute margin.  There are faint collaterals supplying the very distal RCA via the left coronary injection.  Patent LIMA graft supplying the mid LAD.  Severely diseased questional  SVG with new 95% proximal graft stenosis, extensive thrombus with 85% stenosis in the stent in the region of the OM 2 vessel, and total occlusion of the more distal stent in the vein graft with complete occlusion of the distal graft prior to anastomosing into the distal RCA.  Moderate LV dysfunction with EF estimate approximately 35 to 45%.  LVEDP 17 mm.  RECOMMENDATION: Angiograms were reviewed with Dr. [place].  Medical therapy is recommended.  Initiate anti-ischemic medication and guideline directed medical therapy for reduced LV function/ischemic cardiomyopathy.  Plan to resume Eliquis  tomorrow.  Findings Coronary Findings Diagnostic  Dominance: Right  Left Main Mid LM to Dist LM lesion is 70% stenosed.  Left Anterior Descending Mid LAD lesion is 75% stenosed.  First Diagonal Branch 1st Diag lesion is 40% stenosed.  Second Diagonal Branch 2nd Diag lesion is 75% stenosed.  Left Circumflex  First Obtuse Marginal Branch 1st Mrg lesion is 75% stenosed.  Second Obtuse Marginal Branch 2nd Mrg-1 lesion is 80% stenosed. 2nd Mrg-2 lesion is 100% stenosed.  Right Coronary Artery Prox RCA to Mid RCA lesion is 90% stenosed. Mid RCA to Dist RCA lesion is 100% stenosed.  LIMA Graft To Dist LAD  Sequential Graft To 2nd Mrg, RPDA Prox Graft to Mid Graft lesion before 2nd Mrg  is 95% stenosed. Dist Graft to Insertion lesion before 2nd Mrg  is 85% stenosed. The lesion was previously treated . Prox Graft to Mid Graft lesion between 2nd Mrg and RPDA  is 100% stenosed. The lesion was previously treated . Dist Graft to Insertion lesion between 2nd Mrg and RPDA  is 100% stenosed.  Intervention  No interventions have been documented.   CARDIAC CATHETERIZATION  CARDIAC CATHETERIZATION 12/23/2019  Conclusion Successful transcatheter edge-to-edge mitral valve repair using a MitraClip XTW device, positioned A2/P2, reducing mitral regurgitation from 4+ to 1+.  Procedure complicated by  transient bradycardic arrest likely precipitated by coronary air embolism, with prompt resuscitation and return to baseline hemodynamics/cardiac function  Recommendations: Continue clopidogrel , resume apixaban  tomorrow morning, assess recovery in the post cardiac catheterization recovery area to determine bed placement in either a cardiac progressive care bed versus ICU     ECHOCARDIOGRAM  ECHOCARDIOGRAM COMPLETE 09/16/2023  Narrative ECHOCARDIOGRAM REPORT    Patient Name:   LEV CERVONE Date of Exam: 09/16/2023 Medical Rec #:  [id number]      Height:       68.0 in Accession #:    [id number]     Weight:       199.2 lb Date of Birth:  Oct 30, 1940      BSA:          2.040 m Patient Age:    82 years       BP:           124/64 mmHg Patient Gender: M              HR:           66 bpm. Exam Location:  [address]  Procedure: 2D Echo, Cardiac Doppler and Color Doppler (Both Spectral and Color Flow Doppler were utilized during procedure).  Indications:    Z98.890 Mitral  Clip Implantation I48.0 Paroxysmal Atrial Fibrillation  History:        Patient has prior history of Echocardiogram examinations, most recent 09/23/2022. CAD and Previous Myocardial Infarction; Risk Factors:Hypertension, Dyslipidemia and Diabetes. Obstructive sleep apnea. Chronic systolic heart failure.  Sonographer:    Carl Coma RDCS Referring Phys: [id number] [facility] A Shalon Salado  IMPRESSIONS   1. Left ventricular ejection fraction, by estimation, is 45%. The left ventricle has mildly decreased function. The left ventricle demonstrates global hypokinesis. The left ventricular internal cavity size was mildly dilated. Left ventricular diastolic parameters are consistent with Grade II diastolic dysfunction (pseudonormalization). 2. Right ventricular systolic function is normal. The right ventricular size is mildly enlarged. Tricuspid regurgitation signal is inadequate for assessing PA pressure. 3. Left atrial  size was mildly dilated. 4. There is a small PFO from prior septal puncture, left to right shunt. 5. The aortic valve is tricuspid. There is moderate calcification of the aortic valve. Aortic valve regurgitation is trivial. No aortic stenosis is present. 6. S/p mTEER procedure with Mitraclip noted in A2P2 position. Mean gradient 2 mmHg, no significant stenosis. There is eccentric, anteriorly directed mitral regurgitation that is at least moderate (not fully visualized). 7. The inferior vena cava is normal in size with greater than 50% respiratory variability, suggesting right atrial pressure of 3 mmHg.  FINDINGS Left Ventricle: Left ventricular ejection fraction, by estimation, is 45%. The left ventricle has mildly decreased function. The left ventricle demonstrates global hypokinesis. The left ventricular internal cavity size was mildly dilated. There is no left ventricular hypertrophy. Left ventricular diastolic parameters are consistent with Grade II diastolic dysfunction (pseudonormalization).  Right Ventricle: The right ventricular size is mildly enlarged. No increase in right ventricular wall thickness. Right ventricular systolic function is normal. Tricuspid regurgitation signal is inadequate for assessing PA pressure.  Left Atrium: Left atrial size was mildly dilated.  Right Atrium: Right atrial size was normal in size.  Pericardium: There is no evidence of pericardial effusion.  S/p mTEER procedure with Mitraclip noted in A2P2 position. Mean gradient 2 mmHg, no significant stenosis. There is eccentric, anteriorly directed mitral regurgitation that is at least moderate (not fully visualized). Tricuspid Valve: The tricuspid valve is normal in structure. Tricuspid valve regurgitation is trivial.  Aortic Valve: The aortic valve is tricuspid. There is moderate calcification of the aortic valve. Aortic valve regurgitation is trivial. No aortic stenosis is present.  Pulmonic Valve: The  pulmonic valve was normal in structure. Pulmonic valve regurgitation is trivial.  Aorta: The aortic root is normal in size and structure.  Venous: The inferior vena cava is normal in size with greater than 50% respiratory variability, suggesting right atrial pressure of 3 mmHg.  IAS/Shunts: There is a small PFO from prior septal puncture, left to right shunt.   LEFT VENTRICLE PLAX 2D LVIDd:         5.60 cm   Diastology LVIDs:         4.10 cm   LV e' medial:    6.36 cm/s LV PW:         1.00 cm   LV E/e' medial:  13.3 LV IVS:        0.90 cm   LV e' lateral:   9.94 cm/s LVOT diam:     2.00 cm   LV E/e' lateral: 8.5 LV SV:         62 LV SV Index:   30 LVOT Area:     3.14 cm   RIGHT VENTRICLE  IVC RV Basal diam:  4.40 cm     IVC diam: 1.30 cm RV S prime:     12.90 cm/s TAPSE (M-mode): 2.0 cm  LEFT ATRIUM             Index        RIGHT ATRIUM           Index LA diam:        5.40 cm 2.65 cm/m   RA Area:     16.20 cm LA Vol (A2C):   70.8 ml 34.70 ml/m  RA Volume:   44.80 ml  21.96 ml/m LA Vol (A4C):   69.6 ml 34.11 ml/m LA Biplane Vol: 73.2 ml 35.88 ml/m AORTIC VALVE LVOT Vmax:   92.35 cm/s LVOT Vmean:  58.950 cm/s LVOT VTI:    0.196 m  AORTA Ao Root diam: 3.40 cm Ao Asc diam:  3.30 cm  MITRAL VALVE MV Area (PHT): 3.95 cm    SHUNTS MV Area VTI:   1.96 cm    Systemic VTI:  0.20 m MV Peak grad:  3.7 mmHg    Systemic Diam: 2.00 cm MV Mean grad:  2.0 mmHg MV Vmax:       0.96 m/s MV Vmean:      61.8 cm/s MV Decel Time: 192 msec MR Peak grad: 106.9 mmHg MR Mean grad: 77.0 mmHg MR Vmax:      517.00 cm/s MR Vmean:     424.0 cm/s MV E velocity: 84.55 cm/s MV A velocity: 81.05 cm/s MV E/A ratio:  1.04  Dalton McleanMD Electronically signed by Ezra Kanner Signature Date/Time: 09/16/2023/12:07:09 PM    Final   TEE  ECHO TEE 02/01/2020  Narrative TRANSESOPHOGEAL ECHO REPORT    Patient Name:   NORBERT MALKIN Date of Exam: 02/01/2020 Medical Rec  #:  [id number]      Height:       68.0 in Accession #:    [id number]     Weight:       232.0 lb Date of Birth:  04-Sep-1940      BSA:          2.177 m Patient Age:    79 years       BP:           100/69 mmHg Patient Gender: M              HR:           81 bpm. Exam Location:  Inpatient  Procedure: Transesophageal Echo and 3D Echo  Indications:     Atrial fibrillation ablation  History:         Patient has prior history of Echocardiogram examinations, most recent 01/24/2020. CHF, CAD, Mitral Valve Disease, Arrythmias:Atrial Fibrillation; Risk Factors:Diabetes, Hypertension, Sleep Apnea and Former Smoker. S/P mitral valve clip implantation.  Sonographer:     Rome Eans RDCS (AE) Referring Phys:  [id number] WILL MARTIN CAMNITZ Diagnosing Phys: Stanly Leavens MD  PROCEDURE: After discussion of the risks and benefits of a TEE, an informed consent was obtained from the patient. The transesophogeal probe was passed without difficulty through the esophogus of the patient. Sedation performed by different physician. The patient was monitored while under deep sedation. Anesthestetic sedation was provided intravenously by Anesthesiology: 140mg  of Propofol , 80mg  of Lidocaine . Image quality was good. The patient developed no complications during the procedure.  IMPRESSIONS   1. Left ventricular ejection fraction, by estimation, is 40 to 45%. The left ventricle has mildly decreased function. The  left ventricle demonstrates global hypokinesis. Left ventricular diastolic function could not be evaluated. 2. Right ventricular systolic function is mildly reduced. The right ventricular size is mildly enlarged. 3. Left atrial size was severely dilated. No left atrial/left atrial appendage thrombus was detected. 4. Right atrial size was moderately dilated. 5. The mitral valve has been repaired/replaced- stable MitraClip placement. Mild mitral valve regurgitation. 6. The aortic valve is tricuspid. There is  mild thickening of the aortic valve. Aortic valve regurgitation is mild. 7. There is mild (Grade II) plaque. 8. Evidence of atrial level shunting detected by color flow Doppler.  Comparison(s): A prior study was performed on 12/23/19. No LAA thrombus since last procedure; improved MR at present loading conditions.  FINDINGS Left Ventricle: Left ventricular ejection fraction, by estimation, is 40 to 45%. The left ventricle has mildly decreased function. The left ventricle demonstrates global hypokinesis. The left ventricular internal cavity size was normal in size. Left ventricular diastolic function could not be evaluated.  Right Ventricle: The right ventricular size is mildly enlarged. Right vetricular wall thickness was not assessed. Right ventricular systolic function is mildly reduced.  Left Atrium: Left atrial size was severely dilated. No left atrial/left atrial appendage thrombus was detected.  Right Atrium: Right atrial size was moderately dilated.  Pericardium: There is no evidence of pericardial effusion.  Mitral Valve: Mitra Clip stable positioning with mitral regurgitation. The mitral valve has been repaired/replaced. Mild mitral valve regurgitation. There is a Mitra-Clip present in the mitral position.  Tricuspid Valve: The tricuspid valve is normal in structure. Tricuspid valve regurgitation is mild.  Aortic Valve: The aortic valve is tricuspid. There is mild thickening of the aortic valve. Aortic valve regurgitation is mild.  Pulmonic Valve: The pulmonic valve was normal in structure. Pulmonic valve regurgitation is trivial.  Aorta: The aortic root and ascending aorta are structurally normal, with no evidence of dilitation. There is mild (Grade II) plaque.  IAS/Shunts: Evidence of atrial level shunting detected by color flow Doppler.    AORTA Ao Asc diam: 2.90 cm  Stanly Leavens MD Electronically signed by Stanly Leavens MD Signature Date/Time:  02/01/2020/12:33:15 PM    Final  MONITORS  LONG TERM MONITOR (3-14 DAYS) 03/14/2023  Narrative Patch Wear Time:  2 days and 6 hours (2025-02-05T12:01:40-0500 to 2025-02-07T18:12:59-0500)  HR 57 - 156, average 73 bpm. 7 nonsustained SVT (longest 15.6 seconds) No atrial fibrillation detected. Frequent supraventricular ectopy, 13.4%. Occasional ventricular ectopy, 6.7%. No sustained arrhythmias. No symptom trigger episodes.  Fonda Kitty Cardiac Electrophysiology       ______________________________________________________________________________________________       Physical Exam:    VS:  BP 124/62 (BP Location: Left Arm, Patient Position: Sitting, Cuff Size: Normal)   Pulse 69   Ht 5' 8 (1.727 m)   Wt 198 lb 3.2 oz (89.9 kg)   SpO2 99%   BMI 30.14 kg/m    Wt Readings from Last 3 Encounters:  10/10/23 198 lb 3.2 oz (89.9 kg)  10/07/23 201 lb (91.2 kg)  09/03/23 199 lb 3.2 oz (90.4 kg)    Gen: no distress Neck: No JVD Cardiac: No Rubs or Gallops, Systolic Murmur Respiratory: Clear to auscultation bilaterally, normal effort, normal  respiratory rate GI: Soft, nontender, non-distended  MS: +1 Leg left edema;  moves all extremities Integument: Skin feels warm Neuro:  At time of evaluation, alert and oriented to person/place/time/situation  Psych: Normal affect, patient feels ok   ASSESSMENT AND PLAN: .    Heart failure with mildly reduced ejection  fraction and moderate mitral regurgitation post-MitraClip Heart failure with EF 45% and moderate mitral regurgitation. Symptoms include fatigue, ankle and leg swelling, no chest pain or significant dyspnea. Echocardiogram shows increased mitral regurgitation since last evaluation. He is active and prefers conservative management unless symptoms worsen. - Prescribe Lasix  20 mg PO PRN for swelling. - Order cardiac MRI in 6 months to quantify mitral regurgitation. - Consider transesophageal echocardiogram if symptoms  worsen significantly. - Discuss potential future interventions including medication adjustments and possible repeat MitraClip if necessary. - Check basic natriuretic peptide in November to assess volume status; may add SLGT2i (other GMDT not started at prior visits due to symptomatic hypotension, see Dr. Theressa note)  Coronary artery disease Coronary artery disease is well-managed with atorvastatin .  No change in therapy Hyperlipidemia is well-controlled with atorvastatin . Cholesterol levels are goal  Paroxysmal atrial fibrillation Hematuria Paroxysmal atrial fibrillation is well-managed on Eliquis  with no recent hematuria. Previous consideration for Watchman device declined by him.   Mitral Regurgitation Moderate mitral regurgitation status post MitraClip without single leaflet detachment as of July 2024. - CMR in six months  Longitudinal care: The evaluation and management services provided today reflect the complexity inherent in caring for this patient, including the ongoing longitudinal relationship and management of multiple chronic conditions and/or the need for care coordination. The visit required a comprehensive assessment and management plan tailored to the patient's unique needs Time was spent addressing not only the acute concerns but also the broader context of the patient's health, including preventive care, chronic disease management, and care coordination as appropriate.  Complex longitudinal is necessary for conditions including: structural heart disease with asymptomatic residual MR s/ 2021 mTEER who may be candidate for future intervention    Stanly Leavens, MD FASE [facility] Cardiologist [facility]  [address] [place], [place] [address] [phone]  11:30 AM

## 2023-10-10 NOTE — Patient Instructions (Signed)
 Medication Instructions:  Your physician has recommended you make the following change in your medication:  START: furosemide  (Lasix ) 20 mg by mouth once daily as needed for leg swelling  *If you need a refill on your cardiac medications before your next appointment, please call your pharmacy*  Lab Work: NOV 2025- - - BNP at any [employer]  If you have labs (blood work) drawn today and your tests are completely normal, you will receive your results only by: [employer] (if you have MyChart) OR A paper copy in the mail If you have any lab test that is abnormal or we need to change your treatment, we will call you to review the results.  Testing/Procedures: [facility] 2026- - - Your physician has requested that you have a cardiac MRI. Cardiac MRI uses a computer to create images of your heart as its beating, producing both still and moving pictures of your heart and major blood vessels. For further information please visit [email or web address]. Please follow the instruction sheet given to you today for more information.    Follow-Up: At [facility], you and your health needs are our priority.  As part of our continuing mission to provide you with exceptional heart care, our providers are all part of one team.  This team includes your primary Cardiologist (physician) and Advanced Practice Providers or APPs (Physician Assistants and Nurse Practitioners) who all work together to provide you with the care you need, when you need it.  Your next appointment:   7 month(s)  Provider:   Stanly DELENA Leavens, MD or Orren Fabry, PA-C        Other Instructions   You are scheduled for Cardiac MRI at the location below.  Please arrive for your appointment at ______________ . ?  [facility] [address] [place], [place] [address] Please take advantage of the free valet parking available at the [facility] and [employer] (Entrance C).  Proceed to the [facility] Radiology  Department (First Floor) for check-in.   OR   [facility] [address] [place], [place] [address] Please go to the [facility] and check-in with the desk attendant.   Magnetic resonance imaging (MRI) is a painless test that produces images of the inside of the body without using Xrays.  During an MRI, strong magnets and radio waves work together in a Data processing manager to form detailed images.   MRI images may provide more details about a medical condition than X-rays, CT scans, and ultrasounds can provide.  You may be given earphones to listen for instructions.  You may eat a light breakfast and take medications as ordered with the exception of furosemide , hydrochlorothiazide , chlorthalidone  or spironolactone  (or any other fluid pill). If you are undergoing a stress MRI, please avoid stimulants for 12 hr prior to test. (I.e. Caffeine, nicotine, chocolate, or antihistamine medications)  If your provider has ordered anti-anxiety medications for this test, then you will need a driver.  An IV will be inserted into one of your veins. Contrast material will be injected into your IV. It will leave your body through your urine within a day. You may be told to drink plenty of fluids to help flush the contrast material out of your system.  You will be asked to remove all metal, including: Watch, jewelry, and other metal objects including hearing aids, hair pieces and dentures. Also wearable glucose monitoring systems (ie. Freestyle Libre and Omnipods) (Braces and fillings normally are not a  problem.)   TEST WILL TAKE APPROXIMATELY 1 HOUR  PLEASE NOTIFY SCHEDULING AT LEAST 24 HOURS IN ADVANCE IF YOU ARE UNABLE TO KEEP YOUR APPOINTMENT. [phone]  For more information and frequently asked questions, please visit our website : [email or web address]  Please call the Cardiac Imaging Nurse Navigators with any questions/concerns. [phone] Office

## 2023-10-14 ENCOUNTER — Ambulatory Visit (HOSPITAL_COMMUNITY): Admitting: Psychiatry

## 2023-10-15 ENCOUNTER — Ambulatory Visit: Admitting: Clinical

## 2023-10-15 DIAGNOSIS — F4321 Adjustment disorder with depressed mood: Secondary | ICD-10-CM | POA: Diagnosis not present

## 2023-10-15 NOTE — Progress Notes (Signed)
 Time: 11:00 am-11:58 am CPT Code: 09162E Diagnosis: F43.21, Adjustment Disorder with Depression  Raymond Christensen was seen in person. He reported that he he has been working on going with the flow, with some progress. He reflected upon changes in his life in the last year, and therapist offered validation and support. He is scheduled to be seen again in one month.  Treatment Plan Strengths Client is intelligent, persistent  Preferences No preferences reported  Problems Client is experiencing symptoms of depression, and reports he does not perceive it as depression. He would like to become more dynamic and take control of the depression.  Needs Client would like to be happier within his current circumstances and be able to deal with them more effectively Goals Zamarion would like to return to his wonderful, dynamic, adorable, self. He reported that student used to call him "Dr. Joen." He would like to be this way again.  Objective Robson would like to reduce his fear of becoming like the people he sees using walkers at his retirement community.   Target Date: 07/31/2024 Modality: Individual Therapy Progress: 0 Frequency: Weekly/biweekly  Diagnosis Adjustment disorder with depression Conditions For Discharge Achievement of treatment goals and objectives  Intake Presenting Problem Srihari presented at the urging of his son and daughter-in-law seeking an individual therapist. He had recently moved into [place]  (one month prior to initial appointment). He reported being upset by the number of people with physical and cognitive challenges. He noted that his wife started using a walker following her 80th birthday. He had previously thought this was temporary, but it has proven not to be. He shared that he has experienced symptoms of depression sine that time.  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 He has worked in conjunction with psychologists in Conservation officer, historic buildings sexuality and other subjects. He has been in therapy in the past, but was disappointed in the experience. He shared that getting out and about and keeping busy has helped him in the past. 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 He shared that retirement has gone relatively well. 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 He been less involved with it in the past three months. In January of 2025 his wife began experiencing physical challenges and requiring a walker. Dementia was suspected at the time, but her cognitive ability has improved since then. Recent neurology tests have shown no improvement. He and his wife have been married for 51. He reported that he and his wife had not planned to move to [place], but did so at the urging of their children. Prior to the move, they were satisfied with their home and routine. He reported that his wife was hesitant at first but is acclimating. They had planned on traveling, and have a trip planned to the [place]. They had to cancel a trip to [place] planned in May that they have had to cancel. A close friend "lost interest in everything and died" two months prior to his initial appointment.  Recent Trigger  Rockie presented at the urging of his family.  Marital and Family Information  Present family concerns/problems: Lauren reported that her mother has bipolar disorder. She maintains a good  relationship with her father, whom other family members suggest may have ASD.  Strengths/resources in the family/friends: Lauren described supportive relationships with her father an aunt.  Marital/sexual history patterns: Tinnie has a boyfriend.  Family of Origin   Problems in family of origin: He comes from a lower middle class family in the [place]. He grew up in a home with both parents, his aunt, and sister. His father was deployed for the fist three years of his life.  Family background / ethnic factors: None noted No needs/concerns related to ethnicity reported when asked: No  Education/Vocation  Interpersonal concerns/problems: None reported  Personal strengths: Intelligent, gets along well with others   Military/work problems/concerns: None noted.  Leisure Activities/Daily Functioning  impaired functioning  Legal Status  No Legal Problems  Medical/Nutritional Concerns  Jaun has a history of cardiac problems. He underwent his first heart attack at 51. He has gotten stints and bypass surgery. He most recently suffered a heart attack nearly one year prior to his initial intake session.   unspecified  Comments: adult onset tourettes, asthma, POTS  Substance use/abuse/dependence  unspecified   Comments: 3-4 alcoholic drinks per week. No problems reported.    General Behavior: cooperative  Attire: appropriate  Gait: Not observed-telehealth  Motor Activity: normal  Stream of Thought - Productivity: spontaneous  Stream of thought - Progression: normal  Stream of thought - Language: normal  Emotional tone and reactions - Mood: normal  Emotional tone and reactions - Affect: appropriate  Mental trend/Content of thoughts - Perception: normal  Mental trend/Content of thoughts - Orientation: normal  Mental trend/Content of thoughts - Memory: normal  Mental trend/Content of thoughts - General knowledge: consistent with education  Insight: good  Judgment: good         Andriette LITTIE Ponto, PhD               Andriette LITTIE Ponto, PhD               Andriette LITTIE Ponto, PhD

## 2023-10-17 ENCOUNTER — Telehealth: Payer: Self-pay

## 2023-10-17 NOTE — Telephone Encounter (Signed)
 Copied from CRM [id number]. Topic: Clinical - Lab/Test Results >> Oct 17, 2023 11:11 AM Raymond Christensen wrote: Reason for CRM: Patient is requesting 2 prescriptions for Paxlovid because he and his wife are going on vacation and would like it for just incase. Patient can be reached at [phone].

## 2023-10-17 NOTE — Telephone Encounter (Signed)
 Called spoke with pt and was advised, they stated would hold off on get it.

## 2023-10-20 ENCOUNTER — Other Ambulatory Visit: Payer: Self-pay | Admitting: Family Medicine

## 2023-10-20 ENCOUNTER — Other Ambulatory Visit (HOSPITAL_COMMUNITY): Payer: Self-pay | Admitting: Psychiatry

## 2023-10-27 ENCOUNTER — Telehealth (HOSPITAL_COMMUNITY): Payer: Self-pay

## 2023-10-27 NOTE — Telephone Encounter (Signed)
 Ok. Thank you.

## 2023-10-27 NOTE — Telephone Encounter (Signed)
 Thanks again!

## 2023-10-27 NOTE — Telephone Encounter (Signed)
 Dr. Tasia called this writer to check on an Ativan  prescription for this patient. Patient claimed he was calling our office and no one was answering. There was a prescription sent to the pharmacy in July. The pharmacist confirmed that he has refills there, however, patient picked up a 30 day supply on 9/25 and should not be due for another 2 weeks. I called patient and got voicemail, I left a message with the call back number for the patient to call and clarify.

## 2023-10-29 ENCOUNTER — Ambulatory Visit (HOSPITAL_COMMUNITY): Admitting: Psychiatry

## 2023-11-17 ENCOUNTER — Encounter: Payer: Self-pay | Admitting: Radiology

## 2023-11-18 ENCOUNTER — Ambulatory Visit: Admitting: Clinical

## 2023-11-18 DIAGNOSIS — F4321 Adjustment disorder with depressed mood: Secondary | ICD-10-CM

## 2023-11-18 NOTE — Progress Notes (Signed)
 Time: 3:00 pm-3:58 pm CPT Code: 09162E Diagnosis: F43.21, Adjustment Disorder with Depression  Tyrik was seen in person, accompanied by his wife, [name]. He provided written and verbal authorization for her to be present during session. [name] shared her concerns about Hamad's memory, as well as increased irritability. Therapist queried further, and Nathin reported that he does not tend to remember things that do not matter to him. They reported that these issues began before they moved to [place] and have increased since the move. Therapist offered psychoeducation on behavior activation for depression. Therapist suggested trying these strategies as well as considering scheduling an appointment with a neurologist. Rodgers also requested that the therapist consult with his psychiatrist, Dr. Tasia. He is scheduled to be seen again on Monday.  Treatment Plan Strengths Client is intelligent, persistent  Preferences No preferences reported  Problems Client is experiencing symptoms of depression, and reports he does not perceive it as depression. He would like to become more dynamic and take control of the depression.  Needs Client would like to be happier within his current circumstances and be able to deal with them more effectively Goals Jahrell would like to return to his wonderful, dynamic, adorable, self. He reported that student used to call him "Dr. Joen." He would like to be this way again.  Objective Tharun would like to reduce his fear of becoming like the people he sees using walkers at his retirement community.   Target Date: 07/31/2024 Modality: Individual Therapy Progress: 0 Frequency: Weekly/biweekly  Diagnosis Adjustment disorder with depression Conditions For Discharge Achievement of treatment goals and objectives  Intake Presenting Problem Slater presented at the urging of his son and daughter-in-law seeking an individual therapist. He had recently  moved into [place]  (one month prior to initial appointment). He reported being upset by the number of people with physical and cognitive challenges. He noted that his wife started using a walker following her 80th birthday. He had previously thought this was temporary, but it has proven not to be. He shared that he has experienced symptoms of depression sine that time.  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 He has worked in conjunction with psychologists in conservation officer, historic buildings sexuality and other subjects. He has been in therapy in the past, but was disappointed in the experience. He shared that getting out and about and keeping busy has helped him in the past. 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 He shared that retirement has gone relatively well. 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 He been less involved with it in the past three months. In January of 2025 his wife began experiencing physical challenges and requiring a walker. Dementia was suspected at the time, but her cognitive ability has improved since then. Recent neurology tests have shown no improvement. He and his wife have been married for 63. He reported that he and his wife had not planned to move to [place], but did so at the urging of their children. Prior to the move, they were satisfied with their home and routine. He reported that his wife was hesitant at first but is acclimating. They had planned on traveling, and have a trip planned to the  [place]. They had to cancel a trip to [place] planned in May that they have had to cancel. A close friend "lost interest in everything and died" two months prior to  his initial appointment.  Recent Trigger  Jaydn presented at the urging of his family.  Marital and Family Information  Present family concerns/problems: Lauren reported that her mother has bipolar disorder. She maintains a good relationship with her father, whom other family members suggest may have ASD.  Strengths/resources in the family/friends: Lauren described supportive relationships with her father an aunt.  Marital/sexual history patterns: Tinnie has a boyfriend.  Family of Origin  Problems in family of origin: He comes from a lower middle class family in the [place]. He grew up in a home with both parents, his aunt, and sister. His father was deployed for the fist three years of his life.  Family background / ethnic factors: None noted No needs/concerns related to ethnicity reported when asked: No  Education/Vocation  Interpersonal concerns/problems: None reported  Personal strengths: Intelligent, gets along well with others   Military/work problems/concerns: None noted.  Leisure Activities/Daily Functioning  impaired functioning  Legal Status  No Legal Problems  Medical/Nutritional Concerns  Kylian has a history of cardiac problems. He underwent his first heart attack at 51. He has gotten stints and bypass surgery. He most recently suffered a heart attack nearly one year prior to his initial intake session.   unspecified  Comments: adult onset tourettes, asthma, POTS  Substance use/abuse/dependence  unspecified   Comments: 3-4 alcoholic drinks per week. No problems reported.    General Behavior: cooperative  Attire: appropriate  Gait: Not observed-telehealth  Motor Activity: normal  Stream of Thought - Productivity: spontaneous  Stream of thought - Progression: normal  Stream of thought - Language: normal  Emotional tone and reactions - Mood: normal  Emotional tone and reactions - Affect: appropriate  Mental trend/Content of thoughts - Perception: normal  Mental  trend/Content of thoughts - Orientation: normal  Mental trend/Content of thoughts - Memory: normal  Mental trend/Content of thoughts - General knowledge: consistent with education  Insight: good  Judgment: good        Andriette LITTIE Ponto, PhD               Andriette LITTIE Ponto, PhD

## 2023-11-23 ENCOUNTER — Other Ambulatory Visit: Payer: Self-pay | Admitting: Family Medicine

## 2023-11-23 DIAGNOSIS — F411 Generalized anxiety disorder: Secondary | ICD-10-CM

## 2023-11-24 ENCOUNTER — Ambulatory Visit: Admitting: Clinical

## 2023-11-24 DIAGNOSIS — F4321 Adjustment disorder with depressed mood: Secondary | ICD-10-CM | POA: Diagnosis not present

## 2023-11-24 NOTE — Progress Notes (Signed)
 Time: 10:00 am-10:58 am CPT Code: 09162E Diagnosis: F43.21, Adjustment Disorder with Depression  Raymond Christensen was seen in person, accompanied by his wife, [name]. He provided written authorization for the therapist to consult with Dr. Tasia, his psychiatrist. Session focused on a conflict that had arisen between them the day before, as well as an increase in frequency of conflicts in general. Therapist processed this with them, working with them to identify underlying emotions, and suggested working toward checking in with each other as to how each is feeling more regularly. Therapist also cautioned against attempting to manage the others' mood. Keighan is scheduled to be seen again in three weeks.   Treatment Plan Strengths Client is intelligent, persistent  Preferences No preferences reported  Problems Client is experiencing symptoms of depression, and reports he does not perceive it as depression. He would like to become more dynamic and take control of the depression.  Needs Client would like to be happier within his current circumstances and be able to deal with them more effectively Goals Adley would like to return to his wonderful, dynamic, adorable, self. He reported that student used to call him "Dr. Joen." He would like to be this way again.  Objective Gates would like to reduce his fear of becoming like the people he sees using walkers at his retirement community.   Target Date: 07/31/2024 Modality: Individual Therapy Progress: 0 Frequency: Weekly/biweekly  Diagnosis Adjustment disorder with depression Conditions For Discharge Achievement of treatment goals and objectives  Intake Presenting Problem Harl presented at the urging of his son and daughter-in-law seeking an individual therapist. He had recently moved into [place]  (one month prior to initial appointment). He reported being upset by the number of people with physical and cognitive challenges. He noted  that his wife started using a walker following her 80th birthday. He had previously thought this was temporary, but it has proven not to be. He shared that he has experienced symptoms of depression sine that time.  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 He has worked in conjunction with psychologists in conservation officer, historic buildings sexuality and other subjects. He has been in therapy in the past, but was disappointed in the experience. He shared that getting out and about and keeping busy has helped him in the past. 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 He shared that retirement has gone relatively well. 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 He been less involved with it in the past three months. In January of 2025 his wife began experiencing physical challenges and requiring a walker. Dementia was suspected at the time, but her cognitive ability has improved since then. Recent neurology tests have shown no improvement. He and his wife have been married for 47. He reported that he and his wife had not planned to move to [place], but did so at the urging of their children. Prior to the move, they were satisfied with their home and routine. He reported that his wife was hesitant at first but is acclimating. They had planned on traveling, and have a trip planned to the [place]. They had to cancel a trip to [place] planned in May that they have had to  cancel. A close friend "lost interest in everything and died" two months prior to his initial appointment.  Recent Trigger  Kamen presented at the urging of his family.  Marital and Family Information  Present family concerns/problems:  Lauren reported that her mother has bipolar disorder. She maintains a good relationship with her father, whom other family members suggest may have ASD.  Strengths/resources in the family/friends: Lauren described supportive relationships with her father an aunt.  Marital/sexual history patterns: Tinnie has a boyfriend.  Family of Origin  Problems in family of origin: He comes from a lower middle class family in the [place]. He grew up in a home with both parents, his aunt, and sister. His father was deployed for the fist three years of his life.  Family background / ethnic factors: None noted No needs/concerns related to ethnicity reported when asked: No  Education/Vocation  Interpersonal concerns/problems: None reported  Personal strengths: Intelligent, gets along well with others   Military/work problems/concerns: None noted.  Leisure Activities/Daily Functioning  impaired functioning  Legal Status  No Legal Problems  Medical/Nutritional Concerns  Tandy has a history of cardiac problems. He underwent his first heart attack at 51. He has gotten stints and bypass surgery. He most recently suffered a heart attack nearly one year prior to his initial intake session.   unspecified  Comments: adult onset tourettes, asthma, POTS  Substance use/abuse/dependence  unspecified   Comments: 3-4 alcoholic drinks per week. No problems reported.    General Behavior: cooperative  Attire: appropriate  Gait: Not observed-telehealth  Motor Activity: normal  Stream of Thought - Productivity: spontaneous  Stream of thought - Progression: normal  Stream of thought - Language: normal  Emotional tone and reactions - Mood: normal  Emotional tone and reactions - Affect: appropriate  Mental trend/Content of thoughts - Perception: normal  Mental trend/Content of thoughts - Orientation: normal  Mental trend/Content of thoughts - Memory: normal  Mental trend/Content of thoughts - General knowledge:  consistent with education  Insight: good  Judgment: good    Andriette LITTIE Ponto, PhD

## 2023-11-30 ENCOUNTER — Other Ambulatory Visit: Payer: Self-pay | Admitting: Family Medicine

## 2023-11-30 DIAGNOSIS — F411 Generalized anxiety disorder: Secondary | ICD-10-CM

## 2023-12-01 ENCOUNTER — Other Ambulatory Visit: Payer: Self-pay | Admitting: Internal Medicine

## 2023-12-16 ENCOUNTER — Encounter (HOSPITAL_COMMUNITY): Payer: Self-pay | Admitting: Psychiatry

## 2023-12-16 ENCOUNTER — Ambulatory Visit (HOSPITAL_COMMUNITY): Admitting: Psychiatry

## 2023-12-16 ENCOUNTER — Other Ambulatory Visit (HOSPITAL_COMMUNITY): Payer: Self-pay

## 2023-12-16 ENCOUNTER — Other Ambulatory Visit: Payer: Self-pay

## 2023-12-16 VITALS — BP 172/82 | HR 67 | Ht 68.0 in | Wt 204.0 lb

## 2023-12-16 DIAGNOSIS — F329 Major depressive disorder, single episode, unspecified: Secondary | ICD-10-CM | POA: Diagnosis not present

## 2023-12-16 DIAGNOSIS — F320 Major depressive disorder, single episode, mild: Secondary | ICD-10-CM | POA: Diagnosis not present

## 2023-12-16 MED ORDER — BUPROPION HCL ER (XL) 150 MG PO TB24: ORAL_TABLET | ORAL | 4 refills | Status: DC

## 2023-12-16 NOTE — Progress Notes (Signed)
 Psychiatric Initial Adult Assessment   Patient Identification: Raymond Christensen MRN:  [id number] Date of Evaluation:  12/16/2023 Referral Source: Dr. Hercules Seeds Chief Complaint: Irritable Visit Diagnosis:       Today the patient is not doing well.  In the last 2 months he has felt more and more depressed.  It is persistent.  It is relatively pervasive and that it occurs when he is at home but when he gets up and starts doing things he does feel better.  He however is becoming more withdrawn.  He describes himself as being less social active reading less and enjoying things less so.  He currently demonstrates a degree of anhedonia.  His sleep is probably worse.  And that he is sleeping more.  His appetite is okay.  He is not describing what is considered to be psychomotor slowing.  He admits to problems thinking and concentrating as well.  He denies feeling worthless or suicidal.  He did sell his house.  And his wife is actually physically doing better.  So in some ways things are better.  He says the stress he believes is related to his realization of how impaired a lot of the residents at [facility].  He says it is getting to them that he sees so many people and walkers and likely a lot of people are also dying.  It supposed to be an independent living setting but he says it feels more like a nursing home.  Fortunately the patient also has made a good relationship with his therapist Jenna Mendelson.  The patient really never describes a distinct episode of major depression in the past.  He does acknowledge that this is more depressed than he has ever been.  He is fortunate that he has a good relationship with his therapist he has good support of his children and his wife is actually physically doing better.  Associated Signs/Symptoms: Depression Symptoms:  depressed mood, (Hypo) Manic Symptoms:   Anxiety Symptoms:  Excessive Worry, Psychotic Symptoms:   PTSD Symptoms: NA  Past Psychiatric History:    Previous Psychotropic Medications: Yes   Substance Abuse History in the last 12 months:  No.  Consequences of Substance Abuse: NA  Past Medical History:  Past Medical History:  Diagnosis Date   Arthritis    CAD (coronary artery disease)    Cancer (HCC)    skin lt. arm.   Carotid artery disease    s/p left CEA 01/17/14   Chronic systolic CHF (congestive heart failure) [facility])    Concussion Summer 2012   Essential hypertension 02/26/2017   Heart disease    Hyperlipidemia    Hypothyroidism 10/15/2017   Major depressive disorder    Mitral valve regurgitation    Myocardial infarction [facility]) 1996, 2021   Obstructive sleep apnea on CPAP    Persistent atrial fibrillation (HCC)    Poor flexibility of tendon 12/01/2018   S/P mitral valve clip implantation 12/23/2019   s/p TEER with MitraClip with one XTW by Dr. Wonda   Subungual hematoma of digit of hand 12/01/2018   Subungual hematoma of right foot 12/01/2018   Type 2 diabetes mellitus without complication, without long-term current use of insulin  (HCC) 02/26/2017    Past Surgical History:  Procedure Laterality Date   ATRIAL FIBRILLATION ABLATION N/A 02/01/2020   Procedure: ATRIAL FIBRILLATION ABLATION;  Surgeon: Inocencio Soyla Lunger, MD;  Location: [facility] INVASIVE CV LAB;  Service: Cardiovascular;  Laterality: N/A;   BYPASS GRAFT  2001   CARDIAC CATHETERIZATION  CARDIOVERSION N/A 11/08/2019   Procedure: CARDIOVERSION;  Surgeon: Delford Maude BROCKS, MD;  Location: [facility] ENDOSCOPY;  Service: Cardiovascular;  Laterality: N/A;   CAROTID ENDARTERECTOMY Left 01/17/2014   CORONARY ANGIOPLASTY     CORONARY ARTERY BYPASS GRAFT     LEFT HEART CATH AND CORS/GRAFTS ANGIOGRAPHY N/A 06/26/2022   Procedure: LEFT HEART CATH AND CORS/GRAFTS ANGIOGRAPHY;  Surgeon: Burnard Debby LABOR, MD;  Location: [facility] INVASIVE CV LAB;  Service: Cardiovascular;  Laterality: N/A;   RIGHT/LEFT HEART CATH AND CORONARY/GRAFT ANGIOGRAPHY N/A 09/09/2019   Procedure: RIGHT/LEFT  HEART CATH AND CORONARY/GRAFT ANGIOGRAPHY;  Surgeon: Court Dorn PARAS, MD;  Location: [facility] INVASIVE CV LAB;  Service: Cardiovascular;  Laterality: N/A;   TEE WITHOUT CARDIOVERSION N/A 09/13/2019   Procedure: TRANSESOPHAGEAL ECHOCARDIOGRAM (TEE);  Surgeon: Lonni Slain, MD;  Location: [facility] ENDOSCOPY;  Service: Cardiovascular;  Laterality: N/A;   TEE WITHOUT CARDIOVERSION N/A 12/23/2019   Procedure: TRANSESOPHAGEAL ECHOCARDIOGRAM (TEE);  Surgeon: Wonda Sharper, MD;  Location: [facility] INVASIVE CV LAB;  Service: Cardiovascular;  Laterality: N/A;   TEE WITHOUT CARDIOVERSION  02/01/2020   Procedure: TRANSESOPHAGEAL ECHOCARDIOGRAM (TEE);  Surgeon: Inocencio Soyla Lunger, MD;  Location: [facility] INV[facility] CV LAB;  Service: Cardiovascular;;   TOTAL HIP ARTHROPLASTY  1994, 1995   TOTAL HIP ARTHROPLASTY Bilateral    TRANSCATHETER MITRAL EDGE TO EDGE REPAIR N/A 12/23/2019   Procedure: MITRAL VALVE REPAIR;  Surgeon: Wonda Sharper, MD;  Location: [facility] INVASIVE CV LAB;  Service: Cardiovascular;  Laterality: N/A;   TRANSURETHRAL RESECTION OF PROSTATE N/A 12/27/2022   Procedure: TRANSURETHRAL RESECTION OF THE PROSTATE (TURP);  Surgeon: Nieves Cough, MD;  Location: [facility] ORS;  Service: Urology;  Laterality: N/A;  75 minute case    Family Psychiatric History:   Family History:  Family History  Problem Relation Age of Onset   Dementia Father        Concerns surrounding Lewy body dementia, but never officially diagnosed   Cancer Neg Hx     Social History:   Social History   Socioeconomic History   Marital status: Married    Spouse name: Not on file   Number of children: 2   Years of education: 19   Highest education level: Doctorate  Occupational History   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  Tobacco Use   Smoking status: Former    Current packs/day: 0.00    Types: Cigarettes    Quit date: 1980    Years since quitting: 45.9   Smokeless tobacco: Never  Vaping Use   Vaping status: Never Used  Substance and Sexual  Activity   Alcohol use: Yes    Alcohol/week: 1.0 standard drink of alcohol    Types: 1 Cans of beer per week    Comment: occasional   Drug use: No   Sexual activity: Yes  Other Topics Concern   Not on file  Social History Narrative   Pt is R handed   Lives in single story home with his wife, [name]   Has 2 adult children   PhD in Biology   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 [employer]    Social Drivers of Health   Financial Resource Strain: Low Risk  (07/31/2022)   Overall Financial Resource Strain (CARDIA)    Difficulty of Paying Living Expenses: Not hard at all  Food Insecurity: No Food Insecurity (06/26/2022)   Hunger Vital Sign    Worried About Running Out of Food in the Last Year: Never true    Ran Out of Food in the Last Year: Never true  Transportation Needs: No Transportation Needs (06/26/2022)   PRAPARE - Administrator, Civil Service (Medical): No    Lack of Transportation (Non-Medical): No  Physical Activity: Inactive (07/31/2022)   Exercise Vital Sign    Days of Exercise per Week: 0 days    Minutes of Exercise per Session: 0 min  Stress: No Stress Concern Present (07/31/2022)   [employer] of Occupational Health - Occupational Stress Questionnaire    Feeling of Stress : Not at all  Social Connections: Socially Integrated (07/31/2022)   Social Connection and Isolation Panel    Frequency of Communication with Friends and Family: Three times a week    Frequency of Social Gatherings with Friends and Family: Once a week    Attends Religious Services: More than 4 times per year    Active Member of [employer] or Organizations: Yes    Attends Engineer, Structural: More than 4 times per year    Marital Status: Married    Additional Social History:   Allergies:   Allergies  Allergen Reactions   Sulfa Antibiotics Rash    Questionable, he developed a diffuse rash 2 days after stopping Bactrim    Metabolic Disorder Labs: Lab Results  Component  Value Date   HGBA1C 5.6 06/24/2023   MPG 108.28 06/26/2022   MPG 120 05/03/2022   No results found for: PROLACTIN Lab Results  Component Value Date   CHOL 107 06/24/2023   TRIG 75.0 06/24/2023   HDL 42.40 06/24/2023   CHOLHDL 3 06/24/2023   VLDL 15.0 06/24/2023   LDLCALC 50 06/24/2023   LDLCALC 56 12/17/2022   Lab Results  Component Value Date   TSH 1.26 06/24/2023    Therapeutic Level Labs: No results found for: LITHIUM No results found for: CBMZ No results found for: VALPROATE  Current Medications: Current Outpatient Medications  Medication Sig Dispense Refill   buPROPion  (WELLBUTRIN  XL) 150 MG 24 hr tablet 1 qam  for  1 week then 2 qam 60 tablet 4   acetaminophen  (TYLENOL ) 500 MG tablet Take 1,000 mg by mouth every 6 (six) hours as needed for moderate pain or headache.     atorvastatin  (LIPITOR ) 80 MG tablet TAKE 1 TABLET BY MOUTH DAILY 90 tablet 3   Cholecalciferol  (VITAMIN D3) 250 MCG (10000 UT) capsule Take 10,000 Units by mouth daily.  (Patient not taking: Reported on 10/10/2023)     ELIQUIS  5 MG TABS tablet TAKE 1 TABLET BY MOUTH TWICE  DAILY 180 tablet 3   ezetimibe  (ZETIA ) 10 MG tablet TAKE 1 TABLET BY MOUTH DAILY 90 tablet 3   famotidine  (PEPCID ) 20 MG tablet Take 1 tablet (20 mg total) by mouth daily. 30 tablet 3   finasteride  (PROSCAR ) 5 MG tablet Take 1 tablet (5 mg total) by mouth daily. 90 tablet 3   fluorouracil (EFUDEX) 5 % cream Apply 1 Application topically 2 (two) times daily.     fluticasone  (FLONASE ) 50 MCG/ACT nasal spray Place 2 sprays into both nostrils daily. 16 g 6   furosemide  (LASIX ) 20 MG tablet Take 1 tablet (20 mg total) by mouth daily as needed (leg swelling). 30 tablet 11   GEMTESA 75 MG TABS Take 1 tablet by mouth daily.     levothyroxine  (SYNTHROID ) 75 MCG tablet TAKE 1 TABLET BY MOUTH DAILY 90 tablet 1   liothyronine  (CYTOMEL ) 5 MCG tablet TAKE 2 TABLETS BY MOUTH DAILY 180 tablet 1   LORazepam  (ATIVAN ) 0.5 MG tablet 1 bid 60  tablet  4   metoprolol  succinate (TOPROL -XL) 25 MG 24 hr tablet Take 0.5 tablets (12.5 mg total) by mouth daily. 45 tablet 3   Multiple Vitamins-Minerals (MULTIVITAMIN ADULT EXTRA C PO) Take 1 tablet by mouth daily.      nitroGLYCERIN  (NITROSTAT ) 0.4 MG SL tablet Place 1 tablet (0.4 mg total) under the tongue every 5 (five) minutes as needed for chest pain. (Patient not taking: Reported on 10/10/2023) 30 tablet 12   OZEMPIC , 2 MG/DOSE, 8 MG/3ML SOPN INJECT SUBCUTANEOUSLY 2 MG EVERY WEEK ON SUNDAY AS DIRECTED 9 mL 3   PARoxetine  (PAXIL ) 10 MG tablet TAKE 1 TABLET BY MOUTH DAILY 90 tablet 0   No current facility-administered medications for this visit.    Musculoskeletal: Strength & Muscle Tone: within normal limits Gait & Station: normal Patient leans: N/A  Psychiatric Specialty Exam: Review of Systems  Blood pressure (!) 172/82, pulse 67, height 5' 8 (1.727 m), weight 204 lb (92.5 kg).Body mass index is 31.02 kg/m.  General Appearance: Casual  Eye Contact:  Good  Speech:  NA  Volume:  Normal  Mood:  Irritable  Affect:  NA  Thought Process:  Goal Directed  Orientation:  Full (Time, Place, and Person)  Thought Content:  WDL  Suicidal Thoughts:  No  Homicidal Thoughts:  No  Memory:  NA  Judgement:  Good  Insight:  Good  Psychomotor Activity:  NA  Concentration:    Recall:  Good  Fund of Knowledge:Fair  Language: Good  Akathisia:  No  Handed:  Right  AIMS (if indicated):    Assets:  Desire for Improvement  ADL's:  Intact  Cognition: WNL  Sleep:  Good   Screenings: GAD-7    Flowsheet Row Office Visit from 09/03/2023 in [facility] Primary Care at [facility] Office Visit from 01/10/2023 in [facility] at [facility]  Total GAD-7 Score 6 4   Mini-Mental    Flowsheet Row Clinical Support from 01/12/2018 in [facility] Primary Care at [facility]  Total Score (max 30 points ) 29   PHQ2-9    Flowsheet Row  Office Visit from 10/07/2023 in [facility] at [facility] Office Visit from 09/03/2023 in [facility] Primary Care at [facility] Office Visit from 07/29/2023 in [facility] Office Visit from 01/10/2023 in [facility] Primary Care at [facility] Office Visit from 12/17/2022 in [facility] Primary Care at [facility]  PHQ-2 Total Score 1 2 2 1  0  PHQ-9 Total Score -- 5 6 2  --   Flowsheet Row Admission (Discharged) from 12/27/2022 in [place] [facility] ED to Hosp-Admission (Discharged) from 06/25/2022 in [facility] ED from 02/01/2022 in [facility] Emergency Department at [facility]  C-SSRS RISK CATEGORY No Risk No Risk No Risk    Assessment and Plan:      This patient's diagnosis is major depression recurrent.  He is now more persistently depressed demonstrating features of anhedonia and a lack of energy.  My intervention at this time is to asked her to continue in one-to-one therapy but to begin on Wellbutrin  150 mg every morning for 1 week and then to double it to 300 mg.  I also asked him to reduce his Ativan  down to just taking 0.5 mg at night.  Perhaps the 0.5 mg in the morning may be having an effect on his  energy that is not the best.  The patient will continue with therapy start the Wellbutrin  and return to see me in 6 to 7 weeks. Collaboration of Care:   Patient/Guardian was advised Release of Information must be obtained prior to any record release in order to collaborate their care with an outside provider. Patient/Guardian was advised if they have not already done so to contact the registration department to sign all necessary forms in order for us  to release information regarding their care.   Consent: Patient/Guardian gives verbal consent for treatment and assignment of benefits for services provided during this visit.  Patient/Guardian expressed understanding and agreed to proceed.   Elna LILLETTE Lo, MD 12/2/20253:58 PM

## 2023-12-22 ENCOUNTER — Ambulatory Visit: Admitting: Family Medicine

## 2023-12-22 ENCOUNTER — Encounter: Payer: Self-pay | Admitting: Family Medicine

## 2023-12-22 VITALS — BP 134/78 | HR 73 | Temp 98.0°F | Resp 16 | Ht 68.0 in | Wt 198.6 lb

## 2023-12-22 DIAGNOSIS — E039 Hypothyroidism, unspecified: Secondary | ICD-10-CM

## 2023-12-22 DIAGNOSIS — E669 Obesity, unspecified: Secondary | ICD-10-CM

## 2023-12-22 DIAGNOSIS — Z7985 Long-term (current) use of injectable non-insulin antidiabetic drugs: Secondary | ICD-10-CM

## 2023-12-22 DIAGNOSIS — R58 Hemorrhage, not elsewhere classified: Secondary | ICD-10-CM

## 2023-12-22 DIAGNOSIS — W19XXXA Unspecified fall, initial encounter: Secondary | ICD-10-CM

## 2023-12-22 DIAGNOSIS — E119 Type 2 diabetes mellitus without complications: Secondary | ICD-10-CM

## 2023-12-22 LAB — MICROALBUMIN / CREATININE URINE RATIO
Creatinine,U: 240.1 mg/dL
Microalb Creat Ratio: 10.3 mg/g (ref 0.0–30.0)
Microalb, Ur: 2.5 mg/dL — ABNORMAL HIGH (ref 0.0–1.9)

## 2023-12-22 NOTE — Patient Instructions (Addendum)
Give [place] 2-3 business days to get the results of your labs back.   Keep the diet clean and stay active.  Do not shower for the rest of the day. When you do wash it, use only soap and water. Do not vigorously scrub. Apply triple antibiotic ointment (like Neosporin) twice daily. Keep the area clean and dry.   Things to look out for: increasing pain not relieved by ibuprofen/acetaminophen, fevers, spreading redness, drainage of pus, or foul odor.  Let us know if you need anything.

## 2023-12-22 NOTE — Progress Notes (Signed)
 Subjective:   Chief Complaint  Patient presents with   Follow-up    Follow Up    Raymond Christensen is a 83 y.o. male here for follow-up of diabetes.   [place] does not routinely ck his sugars.  Patient does not require insulin .   Medications include: Ozempic  2 mg weekly Diet is pretty healthy.  Exercise: None No chest pain or shortness of breath  Hypothyroidism Patient presents for follow-up of hypothyroidism.  Reports compliance with medication-Cytomel  10 mcg daily, levothyroxine  75 mcg daily. Current symptoms include: denies fatigue, weight changes, heat/cold intolerance, bowel/skin changes or CVS symptoms He believes his dose should be not significantly changed  The patient fell 2 days ago.  He has a history of vasovagal syncope and this happened yet again.  His wife found him on his back.  He did not bite his tongue or lose control of his bowel/bladder function.  He has a bruise in his left inguinal region but does not remember how it happened.  He also has a tear of the skin in the area as well.  Past Medical History:  Diagnosis Date   Arthritis    CAD (coronary artery disease)    Cancer (HCC)    skin lt. arm.   Carotid artery disease    s/p left CEA 01/17/14   Chronic systolic CHF (congestive heart failure) (HCC)    Concussion Summer 2012   Essential hypertension 02/26/2017   Heart disease    Hyperlipidemia    Hypothyroidism 10/15/2017   Major depressive disorder    Mitral valve regurgitation    Myocardial infarction [facility]) 1996, 2021   Obstructive sleep apnea on CPAP    Persistent atrial fibrillation (HCC)    Poor flexibility of tendon 12/01/2018   S/P mitral valve clip implantation 12/23/2019   s/p TEER with MitraClip with one XTW by Dr. Wonda   Subungual hematoma of digit of hand 12/01/2018   Subungual hematoma of right foot 12/01/2018   Type 2 diabetes mellitus without complication, without long-term current use of insulin  (HCC) 02/26/2017     Related  testing: Retinal exam: Done Pneumovax: done  Objective:  BP 134/78 (BP Location: Left Arm, Patient Position: Sitting)   Pulse 73   Temp 98 F (36.7 C) (Oral)   Resp 16   Ht 5' 8 (1.727 m)   Wt 198 lb 9.6 oz (90.1 kg)   SpO2 97%   BMI 30.20 kg/m  General:  Well developed, well nourished, in no apparent distress Skin: Tearing of the skin in the left inguinal region.  Ecchymosis over the same region.  Otherwise, exposed skin is warm, no pallor or diaphoresis Lungs:  CTAB, no access msc use Cardio:  RRR, no bruits, no LE edema Musculoskeletal:  Symmetrical muscle groups noted without atrophy or deformity Neuro:  Sensation intact to pinprick on feet Psych: Age appropriate judgment and insight  Assessment:   Type 2 diabetes mellitus in patient with obesity (HCC) - Plan: Comprehensive metabolic panel with GFR, Lipid panel, Hemoglobin A1c, Microalbumin / creatinine urine ratio  Hypothyroidism, unspecified type - Plan: TSH, T4, free  Ecchymosis  Fall, initial encounter   Plan:   Chronic, stable.  Continue Ozempic  2 mg weekly.  Counseled on diet and exercise. Chronic, stable.  Continue Cytomel  10 mcg daily, levothyroxine  75 mcg daily. This should take around 1 month to fully resolve.  He will put triple antibiotic ointment on the abrasion twice daily for the next 7 days.  Keep the area clean and  dry. Secondary to vasovagal syncope which he and his specialty care team are already aware of. F/u in 6 mo. The patient voiced understanding and agreement to the plan.  Mabel [place], DO 12/22/23 4:36 PM

## 2023-12-23 ENCOUNTER — Ambulatory Visit: Payer: Self-pay | Admitting: Family Medicine

## 2023-12-23 ENCOUNTER — Ambulatory Visit: Admitting: Clinical

## 2023-12-23 DIAGNOSIS — F4321 Adjustment disorder with depressed mood: Secondary | ICD-10-CM

## 2023-12-23 LAB — COMPREHENSIVE METABOLIC PANEL WITH GFR
ALT: 21 U/L (ref 0–53)
AST: 19 U/L (ref 0–37)
Albumin: 4.2 g/dL (ref 3.5–5.2)
Alkaline Phosphatase: 60 U/L (ref 39–117)
BUN: 25 mg/dL — ABNORMAL HIGH (ref 6–23)
CO2: 29 meq/L (ref 19–32)
Calcium: 9 mg/dL (ref 8.4–10.5)
Chloride: 106 meq/L (ref 96–112)
Creatinine, Ser: 0.91 mg/dL (ref 0.40–1.50)
GFR: 78.65 mL/min (ref >60.00)
Glucose, Bld: 74 mg/dL (ref 70–99)
Potassium: 4.1 meq/L (ref 3.5–5.1)
Sodium: 141 meq/L (ref 135–145)
Total Bilirubin: 0.7 mg/dL (ref 0.2–1.2)
Total Protein: 6.3 g/dL (ref 6.0–8.3)

## 2023-12-23 LAB — TSH: TSH: 1.08 u[IU]/mL (ref 0.35–5.50)

## 2023-12-23 LAB — LIPID PANEL
Cholesterol: 109 mg/dL (ref 0–200)
HDL: 42.9 mg/dL (ref >39.00)
LDL Cholesterol: 48 mg/dL (ref 0–99)
NonHDL: 66.05
Total CHOL/HDL Ratio: 3
Triglycerides: 88 mg/dL (ref 0.0–149.0)
VLDL: 17.6 mg/dL (ref 0.0–40.0)

## 2023-12-23 LAB — HEMOGLOBIN A1C: Hgb A1c MFr Bld: 5.5 % (ref 4.6–6.5)

## 2023-12-23 LAB — T4, FREE: Free T4: 0.67 ng/dL (ref 0.60–1.60)

## 2023-12-23 NOTE — Progress Notes (Addendum)
 Time: 2:00 pm-2:58 pm CPT Code: 09162E Diagnosis: F43.21, Adjustment Disorder with Depression  Raymond Christensen was seen in person, for individual therapy. He presented with a black eye, and reported to the therapist that he had had a vasovagal episode in the past week. He reported a minor reduction in irritability, and that he had come to see his wife's perspective of making the most of the holidays in their current situation. Raymond Christensen indicated that he would like to pursue exercise for homework, and therapist engaged him in consideration of how to take action on this. He had met with Dr. Tasia, and medications had been adjusted to wellbutrin  to help manage irritability. Raymond Christensen is scheduled to be seen again in one month.   Treatment Plan Strengths Client is intelligent, persistent  Preferences No preferences reported  Problems Client is experiencing symptoms of depression, and reports he does not perceive it as depression. He would like to become more dynamic and take control of the depression.  Needs Client would like to be happier within his current circumstances and be able to deal with them more effectively Goals Raymond Christensen would like to return to his wonderful, dynamic, adorable, self. He reported that student used to call him Dr. Joen. He would like to be this way again.  Objective Raymond Christensen would like to reduce his fear of becoming like the people he sees using walkers at his retirement community.   Target Date: 07/31/2024 Modality: Individual Therapy Progress: 0 Frequency: Weekly/biweekly  Diagnosis Adjustment disorder with depression Conditions For Discharge Achievement of treatment goals and objectives  Intake Presenting Problem Raymond Christensen presented at the urging of his son and daughter-in-law seeking an individual therapist. He had recently moved into [place]  (one month prior to initial appointment). He reported being upset by the number of people with physical and cognitive  challenges. He noted that his wife started using a walker following her 80th birthday. He had previously thought this was temporary, but it has proven not to be. He shared that he has experienced symptoms of depression sine that time.  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 He has worked in conjunction with psychologists in conservation officer, historic buildings sexuality and other subjects. He has been in therapy in the past, but was disappointed in the experience. He shared that getting out and about and keeping busy has helped him in the past. 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 He shared that retirement has gone relatively well. 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 He been less involved with it in the past three months. In January of 2025 his wife began experiencing physical challenges and requiring a walker. Dementia was suspected at the time, but her cognitive ability has improved since then. Recent neurology tests have shown no improvement. He and his wife have been married for 55. He reported that he and his wife had not planned to move to [place], but did so at the urging of their children. Prior to the move, they were satisfied with their home and routine. He reported that his wife was hesitant at first but is acclimating. They had planned on traveling, and have a trip planned to the [place]. They had to cancel  a trip to [place] planned in May that they have had to cancel. A close friend lost interest in everything and died two months prior to his initial appointment.  Recent Trigger  Raymond Christensen presented at the urging of his family.  Marital and Family Information  Present family  concerns/problems: Raymond Christensen reported that her mother has bipolar disorder. She maintains a good relationship with her father, whom other family members suggest may have ASD.  Strengths/resources in the family/friends: Raymond Christensen described supportive relationships with her father an aunt.  Marital/sexual history patterns: Raymond Christensen has a boyfriend.  Family of Origin  Problems in family of origin: He comes from a lower middle class family in the [place]. He grew up in a home with both parents, his aunt, and sister. His father was deployed for the fist three years of his life.  Family background / ethnic factors: None noted No needs/concerns related to ethnicity reported when asked: No  Education/Vocation  Interpersonal concerns/problems: None reported  Personal strengths: Intelligent, gets along well with others   Military/work problems/concerns: None noted.  Leisure Activities/Daily Functioning  impaired functioning  Legal Status  No Legal Problems  Medical/Nutritional Concerns  Raymond Christensen has a history of cardiac problems. He underwent his first heart attack at 51. He has gotten stints and bypass surgery. He most recently suffered a heart attack nearly one year prior to his initial intake session.   unspecified  Comments: adult onset tourettes, asthma, POTS  Substance use/abuse/dependence  unspecified   Comments: 3-4 alcoholic drinks per week. No problems reported.    General Behavior: cooperative  Attire: appropriate  Gait: Not observed-telehealth  Motor Activity: normal  Stream of Thought - Productivity: spontaneous  Stream of thought - Progression: normal  Stream of thought - Language: normal  Emotional tone and reactions - Mood: normal  Emotional tone and reactions - Affect: appropriate  Mental trend/Content of thoughts - Perception: normal  Mental trend/Content of thoughts - Orientation: normal  Mental trend/Content of thoughts - Memory: normal  Mental trend/Content of thoughts - General  knowledge: consistent with education  Insight: good  Judgment: good    Andriette LITTIE Ponto, PhD               Andriette LITTIE Ponto, PhD

## 2023-12-24 ENCOUNTER — Ambulatory Visit: Admitting: Family Medicine

## 2023-12-26 ENCOUNTER — Other Ambulatory Visit: Payer: Self-pay

## 2023-12-26 ENCOUNTER — Emergency Department (HOSPITAL_COMMUNITY)

## 2023-12-26 ENCOUNTER — Inpatient Hospital Stay (HOSPITAL_COMMUNITY)

## 2023-12-26 ENCOUNTER — Inpatient Hospital Stay (HOSPITAL_COMMUNITY)
Admission: EM | Admit: 2023-12-26 | Discharge: 2024-01-12 | DRG: 083 | Disposition: A | Attending: Family Medicine | Admitting: Family Medicine

## 2023-12-26 ENCOUNTER — Encounter (HOSPITAL_COMMUNITY): Payer: Self-pay | Admitting: *Deleted

## 2023-12-26 DIAGNOSIS — I509 Heart failure, unspecified: Secondary | ICD-10-CM | POA: Diagnosis not present

## 2023-12-26 DIAGNOSIS — R4701 Aphasia: Secondary | ICD-10-CM | POA: Diagnosis present

## 2023-12-26 DIAGNOSIS — Z9079 Acquired absence of other genital organ(s): Secondary | ICD-10-CM

## 2023-12-26 DIAGNOSIS — Z96643 Presence of artificial hip joint, bilateral: Secondary | ICD-10-CM | POA: Diagnosis present

## 2023-12-26 DIAGNOSIS — S0635AA Traumatic hemorrhage of left cerebrum with loss of consciousness status unknown, initial encounter: Secondary | ICD-10-CM | POA: Diagnosis present

## 2023-12-26 DIAGNOSIS — S061XAA Traumatic cerebral edema with loss of consciousness status unknown, initial encounter: Secondary | ICD-10-CM | POA: Diagnosis present

## 2023-12-26 DIAGNOSIS — K921 Melena: Secondary | ICD-10-CM | POA: Diagnosis not present

## 2023-12-26 DIAGNOSIS — W19XXXA Unspecified fall, initial encounter: Secondary | ICD-10-CM | POA: Diagnosis present

## 2023-12-26 DIAGNOSIS — Z7901 Long term (current) use of anticoagulants: Secondary | ICD-10-CM

## 2023-12-26 DIAGNOSIS — R569 Unspecified convulsions: Secondary | ICD-10-CM | POA: Diagnosis not present

## 2023-12-26 DIAGNOSIS — N39498 Other specified urinary incontinence: Secondary | ICD-10-CM | POA: Diagnosis present

## 2023-12-26 DIAGNOSIS — T45515A Adverse effect of anticoagulants, initial encounter: Secondary | ICD-10-CM | POA: Diagnosis present

## 2023-12-26 DIAGNOSIS — I951 Orthostatic hypotension: Secondary | ICD-10-CM | POA: Diagnosis not present

## 2023-12-26 DIAGNOSIS — F32A Depression, unspecified: Secondary | ICD-10-CM | POA: Diagnosis present

## 2023-12-26 DIAGNOSIS — N401 Enlarged prostate with lower urinary tract symptoms: Secondary | ICD-10-CM | POA: Diagnosis present

## 2023-12-26 DIAGNOSIS — I110 Hypertensive heart disease with heart failure: Secondary | ICD-10-CM | POA: Diagnosis present

## 2023-12-26 DIAGNOSIS — I4819 Other persistent atrial fibrillation: Secondary | ICD-10-CM | POA: Diagnosis present

## 2023-12-26 DIAGNOSIS — Z87891 Personal history of nicotine dependence: Secondary | ICD-10-CM

## 2023-12-26 DIAGNOSIS — I161 Hypertensive emergency: Secondary | ICD-10-CM | POA: Diagnosis present

## 2023-12-26 DIAGNOSIS — D689 Coagulation defect, unspecified: Secondary | ICD-10-CM | POA: Diagnosis present

## 2023-12-26 DIAGNOSIS — E039 Hypothyroidism, unspecified: Secondary | ICD-10-CM | POA: Diagnosis present

## 2023-12-26 DIAGNOSIS — S066XAA Traumatic subarachnoid hemorrhage with loss of consciousness status unknown, initial encounter: Principal | ICD-10-CM

## 2023-12-26 DIAGNOSIS — S065XAA Traumatic subdural hemorrhage with loss of consciousness status unknown, initial encounter: Secondary | ICD-10-CM | POA: Diagnosis present

## 2023-12-26 DIAGNOSIS — I2510 Atherosclerotic heart disease of native coronary artery without angina pectoris: Secondary | ICD-10-CM | POA: Diagnosis present

## 2023-12-26 DIAGNOSIS — Z818 Family history of other mental and behavioral disorders: Secondary | ICD-10-CM

## 2023-12-26 DIAGNOSIS — I609 Nontraumatic subarachnoid hemorrhage, unspecified: Secondary | ICD-10-CM

## 2023-12-26 DIAGNOSIS — D684 Acquired coagulation factor deficiency: Secondary | ICD-10-CM | POA: Diagnosis not present

## 2023-12-26 DIAGNOSIS — I340 Nonrheumatic mitral (valve) insufficiency: Secondary | ICD-10-CM | POA: Diagnosis present

## 2023-12-26 DIAGNOSIS — E119 Type 2 diabetes mellitus without complications: Secondary | ICD-10-CM | POA: Diagnosis present

## 2023-12-26 DIAGNOSIS — Z882 Allergy status to sulfonamides status: Secondary | ICD-10-CM

## 2023-12-26 DIAGNOSIS — G4733 Obstructive sleep apnea (adult) (pediatric): Secondary | ICD-10-CM | POA: Diagnosis present

## 2023-12-26 DIAGNOSIS — Z6830 Body mass index (BMI) 30.0-30.9, adult: Secondary | ICD-10-CM

## 2023-12-26 DIAGNOSIS — R561 Post traumatic seizures: Secondary | ICD-10-CM | POA: Diagnosis present

## 2023-12-26 DIAGNOSIS — S06350A Traumatic hemorrhage of left cerebrum without loss of consciousness, initial encounter: Secondary | ICD-10-CM | POA: Diagnosis not present

## 2023-12-26 DIAGNOSIS — R008 Other abnormalities of heart beat: Secondary | ICD-10-CM | POA: Diagnosis not present

## 2023-12-26 DIAGNOSIS — I5042 Chronic combined systolic (congestive) and diastolic (congestive) heart failure: Secondary | ICD-10-CM | POA: Diagnosis present

## 2023-12-26 DIAGNOSIS — I4891 Unspecified atrial fibrillation: Secondary | ICD-10-CM | POA: Diagnosis not present

## 2023-12-26 DIAGNOSIS — E785 Hyperlipidemia, unspecified: Secondary | ICD-10-CM | POA: Diagnosis present

## 2023-12-26 DIAGNOSIS — N3281 Overactive bladder: Secondary | ICD-10-CM | POA: Diagnosis present

## 2023-12-26 DIAGNOSIS — Z79890 Hormone replacement therapy (postmenopausal): Secondary | ICD-10-CM

## 2023-12-26 DIAGNOSIS — Z79899 Other long term (current) drug therapy: Secondary | ICD-10-CM

## 2023-12-26 DIAGNOSIS — Z951 Presence of aortocoronary bypass graft: Secondary | ICD-10-CM

## 2023-12-26 DIAGNOSIS — E669 Obesity, unspecified: Secondary | ICD-10-CM | POA: Diagnosis present

## 2023-12-26 DIAGNOSIS — D6832 Hemorrhagic disorder due to extrinsic circulating anticoagulants: Secondary | ICD-10-CM | POA: Diagnosis present

## 2023-12-26 DIAGNOSIS — S0636AA Traumatic hemorrhage of cerebrum, unspecified, with loss of consciousness status unknown, initial encounter: Secondary | ICD-10-CM | POA: Diagnosis not present

## 2023-12-26 DIAGNOSIS — G9349 Other encephalopathy: Secondary | ICD-10-CM | POA: Diagnosis not present

## 2023-12-26 DIAGNOSIS — I252 Old myocardial infarction: Secondary | ICD-10-CM

## 2023-12-26 DIAGNOSIS — Z95818 Presence of other cardiac implants and grafts: Secondary | ICD-10-CM

## 2023-12-26 DIAGNOSIS — F419 Anxiety disorder, unspecified: Secondary | ICD-10-CM | POA: Diagnosis present

## 2023-12-26 DIAGNOSIS — I10 Essential (primary) hypertension: Secondary | ICD-10-CM | POA: Diagnosis not present

## 2023-12-26 DIAGNOSIS — Z7985 Long-term (current) use of injectable non-insulin antidiabetic drugs: Secondary | ICD-10-CM

## 2023-12-26 DIAGNOSIS — Z9861 Coronary angioplasty status: Secondary | ICD-10-CM

## 2023-12-26 DIAGNOSIS — K5900 Constipation, unspecified: Secondary | ICD-10-CM | POA: Diagnosis not present

## 2023-12-26 LAB — CBC
HCT: 38.4 % — ABNORMAL LOW (ref 39.0–52.0)
Hemoglobin: 12.4 g/dL — ABNORMAL LOW (ref 13.0–17.0)
MCH: 28.8 pg (ref 26.0–34.0)
MCHC: 32.3 g/dL (ref 30.0–36.0)
MCV: 89.3 fL (ref 80.0–100.0)
Platelets: 170 K/uL (ref 150–400)
RBC: 4.3 MIL/uL (ref 4.22–5.81)
RDW: 13.8 % (ref 11.5–15.5)
WBC: 8.6 K/uL (ref 4.0–10.5)
nRBC: 0 % (ref 0.0–0.2)

## 2023-12-26 LAB — ETHANOL: Alcohol, Ethyl (B): <15 mg/dL (ref <15)

## 2023-12-26 LAB — DIFFERENTIAL
Abs Immature Granulocytes: 0.04 K/uL (ref 0.00–0.07)
Basophils Absolute: 0 K/uL (ref 0.0–0.1)
Basophils Relative: 0 %
Eosinophils Absolute: 0.1 K/uL (ref 0.0–0.5)
Eosinophils Relative: 1 %
Immature Granulocytes: 1 %
Lymphocytes Relative: 21 %
Lymphs Abs: 1.8 K/uL (ref 0.7–4.0)
Monocytes Absolute: 0.7 K/uL (ref 0.1–1.0)
Monocytes Relative: 8 %
Neutro Abs: 5.9 K/uL (ref 1.7–7.7)
Neutrophils Relative %: 69 %

## 2023-12-26 LAB — COMPREHENSIVE METABOLIC PANEL WITH GFR
ALT: 21 U/L (ref 0–44)
AST: 44 U/L — ABNORMAL HIGH (ref 15–41)
Albumin: 3.7 g/dL (ref 3.5–5.0)
Alkaline Phosphatase: 54 U/L (ref 38–126)
Anion gap: 8 (ref 5–15)
BUN: 13 mg/dL (ref 8–23)
CO2: 26 mmol/L (ref 22–32)
Calcium: 8.6 mg/dL — ABNORMAL LOW (ref 8.9–10.3)
Chloride: 101 mmol/L (ref 98–111)
Creatinine, Ser: 1 mg/dL (ref 0.61–1.24)
GFR, Estimated: >60 mL/min (ref >60)
Glucose, Bld: 113 mg/dL — ABNORMAL HIGH (ref 70–99)
Potassium: 4.8 mmol/L (ref 3.5–5.1)
Sodium: 135 mmol/L (ref 135–145)
Total Bilirubin: 1.5 mg/dL — ABNORMAL HIGH (ref 0.0–1.2)
Total Protein: 6.2 g/dL — ABNORMAL LOW (ref 6.5–8.1)

## 2023-12-26 LAB — GLUCOSE, CAPILLARY
Glucose-Capillary: 105 mg/dL — ABNORMAL HIGH (ref 70–99)
Glucose-Capillary: 90 mg/dL (ref 70–99)
Glucose-Capillary: 103 mg/dL — ABNORMAL HIGH (ref 70–99)

## 2023-12-26 LAB — I-STAT CHEM 8, ED
BUN: 15 mg/dL (ref 8–23)
Calcium, Ion: 1.1 mmol/L — ABNORMAL LOW (ref 1.15–1.40)
Chloride: 102 mmol/L (ref 98–111)
Creatinine, Ser: 1.1 mg/dL (ref 0.61–1.24)
Glucose, Bld: 116 mg/dL — ABNORMAL HIGH (ref 70–99)
HCT: 37 % — ABNORMAL LOW (ref 39.0–52.0)
Hemoglobin: 12.6 g/dL — ABNORMAL LOW (ref 13.0–17.0)
Potassium: 4.5 mmol/L (ref 3.5–5.1)
Sodium: 140 mmol/L (ref 135–145)
TCO2: 27 mmol/L (ref 22–32)

## 2023-12-26 LAB — RAPID URINE DRUG SCREEN, HOSP PERFORMED
Amphetamines: NOT DETECTED
Barbiturates: NOT DETECTED
Benzodiazepines: NOT DETECTED
Cocaine: NOT DETECTED
Opiates: NOT DETECTED
Tetrahydrocannabinol: NOT DETECTED

## 2023-12-26 LAB — HEPARIN LEVEL (UNFRACTIONATED): Heparin Unfractionated: >1.1 [IU]/mL — ABNORMAL HIGH (ref 0.30–0.70)

## 2023-12-26 LAB — PROTIME-INR
INR: 1.2 (ref 0.8–1.2)
INR: 1.3 — ABNORMAL HIGH (ref 0.8–1.2)
Prothrombin Time: 15.7 s — ABNORMAL HIGH (ref 11.4–15.2)
Prothrombin Time: 16.4 s — ABNORMAL HIGH (ref 11.4–15.2)

## 2023-12-26 LAB — TYPE AND SCREEN
ABO/RH(D): A POS
Antibody Screen: NEGATIVE

## 2023-12-26 LAB — APTT
aPTT: 38 s — ABNORMAL HIGH (ref 24–36)
aPTT: 39 s — ABNORMAL HIGH (ref 24–36)

## 2023-12-26 LAB — MRSA NEXT GEN BY PCR, NASAL: MRSA by PCR Next Gen: NOT DETECTED

## 2023-12-26 LAB — CBG MONITORING, ED: Glucose-Capillary: 116 mg/dL — ABNORMAL HIGH (ref 70–99)

## 2023-12-26 MED ORDER — STROKE: EARLY STAGES OF RECOVERY BOOK
Freq: Once | Status: AC
  Filled 2023-12-26: qty 1

## 2023-12-26 MED ORDER — ORAL CARE MOUTH RINSE: 15.0000 mL | OROMUCOSAL | Status: DC | PRN

## 2023-12-26 MED ORDER — PANTOPRAZOLE SODIUM 40 MG IV SOLR
40.0000 mg | Freq: Every day | INTRAVENOUS | Status: DC
  Administered 2023-12-26 – 2024-01-01 (×2): 40 mg via INTRAVENOUS
  Filled 2023-12-26 (×3): qty 10

## 2023-12-26 MED ORDER — ONDANSETRON HCL 4 MG/2ML IJ SOLN
4.0000 mg | Freq: Four times a day (QID) | INTRAMUSCULAR | Status: DC | PRN
  Administered 2023-12-26 – 2023-12-31 (×3): 4 mg via INTRAVENOUS
  Filled 2023-12-26 (×3): qty 2

## 2023-12-26 MED ORDER — HYDRALAZINE HCL 20 MG/ML IJ SOLN
INTRAMUSCULAR | Status: AC
  Filled 2023-12-26: qty 1

## 2023-12-26 MED ORDER — CLEVIDIPINE BUTYRATE 0.5 MG/ML IV EMUL: 0.0000 mg/h | INTRAVENOUS | Status: DC

## 2023-12-26 MED ORDER — DOCUSATE SODIUM 100 MG PO CAPS
100.0000 mg | ORAL_CAPSULE | Freq: Two times a day (BID) | ORAL | Status: DC
  Administered 2023-12-26 – 2024-01-12 (×23): 100 mg via ORAL
  Filled 2023-12-26 (×26): qty 1

## 2023-12-26 MED ORDER — ACTIDOSE-AQUA 50 GM/240ML PO LIQD
50.0000 g | Freq: Once | ORAL | Status: AC
  Administered 2023-12-26: 50 g via ORAL
  Filled 2023-12-26: qty 240

## 2023-12-26 MED ORDER — EMPTY CONTAINERS FLEXIBLE MISC
1800.0000 mg | Freq: Once | Status: AC
  Administered 2023-12-26: 1800 mg via INTRAVENOUS
  Filled 2023-12-26: qty 180

## 2023-12-26 MED ORDER — SODIUM CHLORIDE 0.9 % IV SOLN: INTRAVENOUS | Status: AC

## 2023-12-26 MED ORDER — CLEVIDIPINE BUTYRATE 0.5 MG/ML IV EMUL
INTRAVENOUS | Status: AC
  Administered 2023-12-26: 2 mg/h via INTRAVENOUS
  Filled 2023-12-26: qty 100

## 2023-12-26 MED ORDER — ACETAMINOPHEN 325 MG PO TABS
650.0000 mg | ORAL_TABLET | ORAL | Status: DC | PRN
  Administered 2023-12-26 – 2024-01-11 (×24): 650 mg via ORAL
  Filled 2023-12-26 (×23): qty 2

## 2023-12-26 MED ORDER — PANTOPRAZOLE SODIUM 40 MG PO TBEC
40.0000 mg | DELAYED_RELEASE_TABLET | Freq: Every day | ORAL | Status: DC
  Administered 2023-12-27 – 2024-01-12 (×16): 40 mg via ORAL
  Filled 2023-12-26 (×15): qty 1

## 2023-12-26 MED ORDER — ACETAMINOPHEN 650 MG RE SUPP: 650.0000 mg | RECTAL | Status: DC | PRN

## 2023-12-26 MED ORDER — SODIUM CHLORIDE 0.9% FLUSH
3.0000 mL | Freq: Once | INTRAVENOUS | Status: AC
  Administered 2023-12-26: 3 mL via INTRAVENOUS

## 2023-12-26 MED ORDER — NIMODIPINE 30 MG PO CAPS
60.0000 mg | ORAL_CAPSULE | ORAL | Status: DC
  Administered 2023-12-26: 60 mg via ORAL
  Filled 2023-12-26 (×3): qty 2

## 2023-12-26 MED ORDER — IOHEXOL 350 MG/ML SOLN
75.0000 mL | Freq: Once | INTRAVENOUS | Status: AC | PRN
  Administered 2023-12-26: 75 mL via INTRAVENOUS

## 2023-12-26 MED ORDER — ORAL CARE MOUTH RINSE
15.0000 mL | OROMUCOSAL | Status: DC
  Administered 2023-12-26 – 2023-12-27 (×2): 15 mL via OROMUCOSAL

## 2023-12-26 MED ORDER — ONDANSETRON 4 MG PO TBDP
4.0000 mg | ORAL_TABLET | Freq: Four times a day (QID) | ORAL | Status: DC | PRN
  Administered 2024-01-01: 09:00:00 4 mg via ORAL
  Filled 2023-12-26: qty 1

## 2023-12-26 MED ORDER — HYDRALAZINE HCL 20 MG/ML IJ SOLN
20.0000 mg | Freq: Once | INTRAMUSCULAR | Status: AC
  Administered 2023-12-26: 20 mg via INTRAVENOUS

## 2023-12-26 MED ORDER — CHLORHEXIDINE GLUCONATE CLOTH 2 % EX PADS
6.0000 | MEDICATED_PAD | Freq: Every day | CUTANEOUS | Status: DC
  Administered 2023-12-27 – 2024-01-02 (×6): 6 via TOPICAL

## 2023-12-26 MED ORDER — NIMODIPINE 6 MG/ML PO SOLN
60.0000 mg | ORAL | Status: DC
  Filled 2023-12-26 (×3): qty 10

## 2023-12-26 MED ORDER — ACETAMINOPHEN 160 MG/5ML PO SOLN: 650.0000 mg | ORAL | Status: DC | PRN

## 2023-12-26 NOTE — Consult Note (Addendum)
 WOC Nurse Consult Note: Reason for Consult:R forearm suspected cancer  Wound type: full thickness R arm post biopsy  Pressure Injury POA: NA  Measurement:see nursing flowsheet  Wound bed: red moist  Drainage (amount, consistency, odor) serosanguinous  Periwound: scattered ecchymosis  Dressing procedure/placement/frequency: Cleanse R forearm wound with NS, apply vaseline gauze (Lawson #239) to wound bed daily and secure with silicone foam or Kerlix roll gauze whichever is preferred.  Soak dressing with NS if adhered to wound bed for atraumatic removal.   Patient should continue follow-up with dermatologist as outpatient. WOC team will not follow Reconsult if further needs arise.   Thank you,    Powell Bar MSN, RN-BC, [employer]

## 2023-12-26 NOTE — ED Notes (Signed)
 Initial dry CT begun

## 2023-12-26 NOTE — ED Notes (Signed)
 Room 18, placed on monitor, EDP into room.

## 2023-12-26 NOTE — Progress Notes (Addendum)
 CCM- Dr. Jude took critical CT result. Repeat CT shows enlarged Subdural. CCM advised this RN to let neuroSx know. Paged NeuroSx Dr. Cabbell.  1720- NeuroSx paged again   1745- NeruoSx texted as no answer back   1800- Texted from [employer] RN phone   1900- Passed on to night RN to reach out to Neuro[facility] night shift coverage as Pt needs orders.

## 2023-12-26 NOTE — Progress Notes (Signed)
 Patient ID: Raymond Christensen, male   DOB: 1940/12/27, 83 y.o.   MRN: [id number] BP (!) 124/105   Pulse 68   Temp 98.3 F (36.8 C) (Oral)   Resp (!) 21   SpO2 93%  Films reviewed, there is minimal change in the CT done at 1610 v scan done this morning.  I received no text message regarding this scan done approximately 4 hrs ago. There is no change in the plan to observe, no operative intervention indicated.

## 2023-12-26 NOTE — Progress Notes (Addendum)
 This RN called pharmacy to tell them family stated they would like to go over medications. However unable to reach tech. Medication list and eye drops at bedside for pharmacy to review. Call son Selinda listed in chart with any questions.   Eye patch added to Pt cataract eye per Pt until family brings his in tmrw.

## 2023-12-26 NOTE — Consult Note (Signed)
 HPI:     Patient is a 83 y.o. male with PMH notable for CAD and afib on Eliquis , CHF, HTN, DM, obesity who presented to ED after expressive aphasia onset this AM. Notes history of fall on Saturday, he is amnestic to event. Notes recent cataract surgery L eye. SAH noted on workup, NSGY asked to consult.     Patient Active Problem List   Diagnosis Date Noted   Subarachnoid hemorrhage (HCC) 12/26/2023   GAD (generalized anxiety disorder) 01/10/2023   Pain in left wrist 11/20/2022   S/P CABG (coronary artery bypass graft) 06/27/2022   Hypokalemia 06/27/2022   NSTEMI (non-ST elevated myocardial infarction) (HCC) 06/26/2022   PVC's (premature ventricular contractions) 05/31/2022   Lipoma of right upper extremity 03/02/2021   Trigger little finger of left hand 09/29/2020   Secondary hypercoagulable state 02/29/2020   Depression, major, single episode, moderate (HCC) 02/28/2020   Nonrheumatic tricuspid valve regurgitation    S/P mitral valve clip implantation 12/23/2019   Mitral valve disease 12/23/2019   Nonrheumatic mitral valve regurgitation    Persistent atrial fibrillation (HCC)    Chronic HFrEF    Acute idiopathic gout involving toe of left foot    Thrombocytopenia 09/08/2019   CAD (coronary artery disease)    Type 2 diabetes mellitus in patient with obesity (HCC) 06/22/2019   Major depressive disorder    Obstructive sleep apnea on CPAP    Hypothyroidism 10/15/2017   Subclavian artery stenosis, right 06/15/2017   Severe obesity (BMI 35.0-39.9) with comorbidity (HCC) 02/26/2017   Essential hypertension 02/26/2017   Memory change 01/10/2017   Urge incontinence of urine 01/10/2017   Lightheaded 06/25/2016   AK (actinic keratosis) 01/03/2016   H/O syncope 03/06/2014   Occlusion and stenosis of left carotid artery 01/11/2014   Routine history and physical examination of adult 11/08/2013   History of repair of hip joint 04/20/2013   Hyperlipidemia 04/20/2013   Presence of  artificial hip joint 04/20/2013   Vitamin D  deficiency 06/18/2010   Concussion 01/24/2010   Elevated prostate specific antigen (PSA) 01/01/2007   Benign prostatic hyperplasia without urinary obstruction 04/22/2006   Osteoarthrosis 04/22/2006   Dyslipidemia 04/02/2006   Past Medical History:  Diagnosis Date   Arthritis    CAD (coronary artery disease)    Cancer (HCC)    skin lt. arm.   Carotid artery disease    s/p left CEA 01/17/14   Chronic systolic CHF (congestive heart failure) [facility])    Concussion Summer 2012   Essential hypertension 02/26/2017   Heart disease    Hyperlipidemia    Hypothyroidism 10/15/2017   Major depressive disorder    Mitral valve regurgitation    Myocardial infarction [facility]) 1996, 2021   Obstructive sleep apnea on CPAP    Persistent atrial fibrillation (HCC)    Poor flexibility of tendon 12/01/2018   S/P mitral valve clip implantation 12/23/2019   s/p TEER with MitraClip with one XTW by Dr. Wonda   Subungual hematoma of digit of hand 12/01/2018   Subungual hematoma of right foot 12/01/2018   Type 2 diabetes mellitus without complication, without long-term current use of insulin  (HCC) 02/26/2017    Past Surgical History:  Procedure Laterality Date   ATRIAL FIBRILLATION ABLATION N/A 02/01/2020   Procedure: ATRIAL FIBRILLATION ABLATION;  Surgeon: Inocencio Soyla Lunger, MD;  Location: [facility] INVASIVE CV LAB;  Service: Cardiovascular;  Laterality: N/A;   BYPASS GRAFT  2001   CARDIAC CATHETERIZATION     CARDIOVERSION N/A 11/08/2019   Procedure:  CARDIOVERSION;  Surgeon: Delford Maude BROCKS, MD;  Location: [facility] ENDOSCOPY;  Service: Cardiovascular;  Laterality: N/A;   CAROTID ENDARTERECTOMY Left 01/17/2014   CORONARY ANGIOPLASTY     CORONARY ARTERY BYPASS GRAFT     LEFT HEART CATH AND CORS/GRAFTS ANGIOGRAPHY N/A 06/26/2022   Procedure: LEFT HEART CATH AND CORS/GRAFTS ANGIOGRAPHY;  Surgeon: Burnard Debby LABOR, MD;  Location: [facility] INVASIVE CV LAB;  Service: Cardiovascular;   Laterality: N/A;   RIGHT/LEFT HEART CATH AND CORONARY/GRAFT ANGIOGRAPHY N/A 09/09/2019   Procedure: RIGHT/LEFT HEART CATH AND CORONARY/GRAFT ANGIOGRAPHY;  Surgeon: Court Dorn PARAS, MD;  Location: [facility] INVASIVE CV LAB;  Service: Cardiovascular;  Laterality: N/A;   TEE WITHOUT CARDIOVERSION N/A 09/13/2019   Procedure: TRANSESOPHAGEAL ECHOCARDIOGRAM (TEE);  Surgeon: Lonni Slain, MD;  Location: [facility] ENDOSCOPY;  Service: Cardiovascular;  Laterality: N/A;   TEE WITHOUT CARDIOVERSION N/A 12/23/2019   Procedure: TRANSESOPHAGEAL ECHOCARDIOGRAM (TEE);  Surgeon: Wonda Sharper, MD;  Location: [facility] INVASIVE CV LAB;  Service: Cardiovascular;  Laterality: N/A;   TEE WITHOUT CARDIOVERSION  02/01/2020   Procedure: TRANSESOPHAGEAL ECHOCARDIOGRAM (TEE);  Surgeon: Inocencio Soyla Lunger, MD;  Location: [facility] INVASIVE CV LAB;  Service: Cardiovascular;;   TOTAL HIP ARTHROPLASTY  1994, 1995   TOTAL HIP ARTHROPLASTY Bilateral    TRANSCATHETER MITRAL EDGE TO EDGE REPAIR N/A 12/23/2019   Procedure: MITRAL VALVE REPAIR;  Surgeon: Wonda Sharper, MD;  Location: [facility] INVASIVE CV LAB;  Service: Cardiovascular;  Laterality: N/A;   TRANSURETHRAL RESECTION OF PROSTATE N/A 12/27/2022   Procedure: TRANSURETHRAL RESECTION OF THE PROSTATE (TURP);  Surgeon: Nieves Cough, MD;  Location: [facility] ORS;  Service: Urology;  Laterality: N/A;  75 minute case    (Not in a hospital admission)  Allergies[1]  Social History   Tobacco Use   Smoking status: Former    Current packs/day: 0.00    Types: Cigarettes    Quit date: 1980    Years since quitting: 45.9   Smokeless tobacco: Never  Substance Use Topics   Alcohol use: Yes    Alcohol/week: 1.0 standard drink of alcohol    Types: 1 Cans of beer per week    Comment: occasional    Family History  Problem Relation Age of Onset   Dementia Father        Concerns surrounding Lewy body dementia, but never officially diagnosed   Cancer Neg Hx      Objective:   Patient Vitals  for the past 8 hrs:  BP Pulse Resp SpO2  12/26/23 1320 133/67 65 (!) 21 96 %  12/26/23 1310 135/64 68 16 98 %  12/26/23 1305 -- 73 15 100 %  12/26/23 1300 (!) 143/70 73 14 99 %  12/26/23 1255 -- 71 16 98 %  12/26/23 1250 (!) 141/66 71 16 98 %  12/26/23 1245 -- 69 20 96 %  12/26/23 1240 (!) 143/63 71 17 97 %  12/26/23 1235 (!) 153/74 76 16 99 %  12/26/23 1230 (!) 147/69 68 18 98 %  12/26/23 1215 -- 72 18 99 %  12/26/23 1210 (!) 161/103 75 (!) 22 100 %  12/26/23 1205 -- 73 17 98 %  12/26/23 1200 (!) 144/71 72 18 97 %  12/26/23 1155 (!) 155/69 79 20 100 %  12/26/23 1150 (!) 146/114 81 15 98 %  12/26/23 1145 -- 80 (!) 23 96 %  12/26/23 1140 (!) 142/68 73 19 97 %  12/26/23 1135 134/61 72 16 96 %  12/26/23 1130 (!) 142/61 73 18 97 %  12/26/23 1125 (!)  147/67 76 (!) 23 96 %  12/26/23 1124 -- 80 18 97 %  12/26/23 1123 -- 80 20 97 %  12/26/23 1122 -- 84 (!) 23 97 %  12/26/23 1121 -- 82 19 97 %  12/26/23 1120 (!) 136/100 81 (!) 25 94 %  12/26/23 1119 -- 78 18 97 %  12/26/23 1118 -- 79 17 97 %  12/26/23 1117 -- 80 20 97 %  12/26/23 1116 -- 79 17 94 %  12/26/23 1115 (!) 142/63 81 (!) 22 95 %  12/26/23 1114 -- 74 16 96 %  12/26/23 1113 -- 80 15 96 %  12/26/23 1112 -- 78 19 96 %  12/26/23 1111 -- 81 19 95 %  12/26/23 1110 (!) 115/58 84 19 95 %  12/26/23 1109 -- 80 18 96 %  12/26/23 1108 -- 81 (!) 22 97 %  12/26/23 1107 -- 82 16 97 %  12/26/23 1106 -- 80 18 94 %  12/26/23 1105 134/60 81 20 99 %  12/26/23 1103 -- 83 20 99 %  12/26/23 1102 -- 81 18 97 %  12/26/23 1101 -- 80 18 97 %  12/26/23 1100 138/65 79 19 97 %  12/26/23 1059 -- 76 15 97 %  12/26/23 1058 -- 74 17 99 %  12/26/23 1057 -- 75 (!) 23 98 %  12/26/23 1056 -- 72 20 93 %  12/26/23 1055 (!) 147/59 75 18 100 %  12/26/23 1054 (!) 162/76 73 (!) 21 98 %  12/26/23 1048 (!) 191/71 -- -- --  12/26/23 1045 -- -- -- 98 %  12/26/23 1040 (!) 192/65 65 -- --   No intake/output data recorded. Total I/O In: -  Out: 350  [Urine:350]  CT ANGIO HEAD NECK W WO CM (CODE STROKE) Result Date: 12/26/2023 EXAM: CTA HEAD AND NECK WITH _study_datetime_ TECHNIQUE: CTA of the head and neck was performed with the administration of intravenous contrast. Multiplanar 2D and/or 3D reformatted images are provided for review. Automated exposure control, iterative reconstruction, and/or weight based adjustment of the mA/kV was utilized to reduce the radiation dose to as low as reasonably achievable. Stenosis of the internal carotid arteries measured using NASCET criteria. COMPARISON: CT head reported separately today. CLINICAL HISTORY: 83 year old male with acute intracranial hemorrhage. FINDINGS: CTA NECK: AORTIC ARCH AND ARCH VESSELS: Moderate soft and calcified aortic arch atherosclerosis. Normal 3 vessel arch configuration. CAROTID ARTERIES: Right Common Carotid Artery (CCA): Mild right CCA origin atherosclerosis without stenosis. Right Internal Carotid Artery (ICA): Soft and calcified atherosclerosis at the right carotid bifurcation, right ICA and bulb with less than 50% stenosis. Tortuous right ICA distal to the bulb. Moderate right ICA siphon calcified plaque. Mild to moderate right supraclinoid ICA stenosis on series 9 image 124. Left Common Carotid Artery (CCA): Mild left CCA origin atherosclerosis without stenosis. Left Internal Carotid Artery (ICA): Probable previous left carotid endarterectomy. Superimposed mild soft and calcified plaque at the left ICA origin and bulb with no stenosis. Left ICA siphon moderate calcified plaque and similar mild to moderate left supraclinoid ICA stenosis on series 5 image 115. No dissection or arterial injury. VERTEBRAL ARTERIES: Right Vertebral Artery: Soft and calcified plaque at the right vertebral artery origin without stenosis. Dominant right vertebral artery. Mild right V4 calcified plaque. No significant right vertebral artery stenosis. Left Vertebral Artery: Proximal left subclavian artery  atherosclerosis without stenosis. Nondominant left vertebral artery is diminutive, patent to the vertebrobasilar junction with no significant stenosis. LUNGS AND MEDIASTINUM: Partially visible  sternotomy wires. SOFT TISSUES: Nonvascular neck soft tissue spaces appear negative. BONES: Widespread cervical spine degeneration, advanced facet arthropathy. No acute osseous abnormality. CTA HEAD: ANTERIOR CIRCULATION: Internal Carotid Arteries (ICA): Mild to moderate right supraclinoid ICA stenosis on series 9 image 124. Mild to moderate left supraclinoid ICA stenosis on series 5 image 115. Anterior Cerebral Arteries (ACA): Normal ACA origins. Mildly dominant right ACA A1 segment. Moderate to severe stenosis of the left ACA A1 on series 11 image 35. Normally anterior communicating artery. No other ACA stenosis. Middle Cerebral Arteries (MCA): Normal MCA origins. Right middle cerebral artery within normal limits. Left MCA M1 segment bifurcates early without stenosis. No left MCA aneurysm identified. Left MCA branches appear normal aside from mild regional mass effect. No branch stenosis. No CTA spot sign identified. No aneurysm. POSTERIOR CIRCULATION: Posterior Cerebral Arteries (PCA): Normal PCA origins. Basilar Artery: No basilar artery stenosis. Normal SCA origins. Dominant AICA vessels. Diminutive or absent posterior communicating arteries. No aneurysm. OTHER: Major dural venous sinuses are enhancing and appear patent. IMPRESSION: 1. Negative for intracranial aneurysm, large vessel occlusion, or CTA spot sign in association with Acute intracranial hemorrhage. 2. Atherosclerosis including Moderate to severe stenosis of the left ACA A1 segment, up to moderate bilateral supraclinoid ICA stenosis. Evidence of previous left carotid endarterectomy. Electronically signed by: Helayne Hurst MD 12/26/2023 11:32 AM EST RP Workstation: [id number]   CT HEAD CODE STROKE WO CONTRAST Addendum Date: 12/26/2023 ADDENDUM #1 ADDENDUM:  Study discussed by telephone with Dr. Deedra at 11:17 hours on 12/26/2023. He advises this patient is status post fall, and has left side orbital or scalp bruising. Therefore, posttraumatic intracranial hemorrhage with temporal lobe contusion is also possible: Traumatic brain injury risk stratification: Subarachnoid hemorrhage [place]): Unilateral multifocal (moderate - MBIG 2) Cerebral contusion, intra-axial, intraparenchymal hemorrhage (IPH): 35 mm hemorrhagic contusion (high - MBIG 3) ---------------------------------------------------- Electronically signed by: Helayne Hurst MD 12/26/2023 11:24 AM EST RP Workstation: [id number]   Result Date: 12/26/2023 ORIGINAL REPORT EXAM: CT HEAD WITHOUT CONTRAST 12/26/2023 10:44:18 AM TECHNIQUE: CT of the head was performed without the administration of intravenous contrast. Automated exposure control, iterative reconstruction, and/or weight based adjustment of the mA/kV was utilized to reduce the radiation dose to as low as reasonably achievable. COMPARISON: Brain MRI 04/01/2018, Head CT 02/25/2022. CLINICAL HISTORY: 83 year old male with acute neurological deficit, stroke suspected. FINDINGS: BRAIN AND VENTRICLES: Acute hemorrhage in the left hemisphere appears to be a combination of subarachnoid and intra-axial hematoma (series 5 image 44) and is in proximity to the left MCA bifurcation. Intra-axial blood volume is roughly estimated at 35 x 23 x 23 mm, estimated volume 10 mL. Moderate volume of regional subarachnoid blood mostly tracking along the left operculum toward the vertex. Extra-axial CSF elsewhere is stable from previous exams. No other acute intracranial hemorrhage identified. Hypodense edema surrounding the intra-axial blood. No other acute cerebral edema identified. No IVH or ventriculomegaly. No midline shift. Basilar cisterns remain normal. No suspicious intracranial vascular hyperdensity. Calcified atherosclerosis at the skull base. ORBITS: Interval  postoperative changes to the left globe. No gaze deviation. SINUSES: Paranasal sinuses, middle ears and mastoids well aerated. SOFT TISSUES AND SKULL: No acute soft tissue abnormality. No skull fracture. [place] stroke program early CT score - ASPECTS not applicable, acute intracranial hemorrhage. IMPRESSION: 1. Acute left hemisphere hemorrhage with suspected subarachnoid and intra-axial components (estimated 10 mL in left temporal lobe) with some regional hypodense edema. Consider ruptured Left MCA aneurysm. 2. No midline shift or other complicating features. No other acute  intracranial abnormality. 3. These results were communicated to Dr. Voncile at 1101 hours on 12/26/2023 by text page via the [facility] messaging system. Electronically signed by: Helayne Hurst MD 12/26/2023 11:04 AM EST RP Workstation: [id number]   Awake, alert, oriented Speech intermittently fluent mixed w/ word salad CN grossly intact  5/5 BUE/BLE Bruising R eye   Assessment:   Principal Problem:   Subarachnoid hemorrhage (HCC)  This is an 83yo male with left hemispheric SAH without MLS s/p Andexxa, CTA negative for aneurysmal etiology  Plan:   -Repeat CTH 6H -Prophylactic Keppra -Goal SBP<160 -Neuro checks  Harlyn Rathmann CAYLIN Reshma Hoey, PA-C       [1]  Allergies Allergen Reactions   Sulfa Antibiotics Rash    Questionable, he developed a diffuse rash 2 days after stopping Bactrim

## 2023-12-26 NOTE — ED Triage Notes (Signed)
 BIB [facility] from home, code stroke called PTA, arrives to full neuro team, EDP present. Sent straight to CT scanner via EMS. See EDP and neuro team notes for details. Recent vasovagal episode with fall while straining on commode, h/o similar, gross frank bruising noted to L groin.hip, bruising noted to R lateral eye. Takes eliquis  for heart. Last dose around 9am. Mentions some HA. Denies dizziness. Significant word finding issues present. VSS: 180/70, HR 70, SPO2 98% RA, CBG 134. NSL 20g L AC. Alert, NAD, calm, interactive, speech clear with word finding issues and inappropriate words. Skin W&D.

## 2023-12-26 NOTE — ED Notes (Signed)
 No changes. Alert, NAD, calm, interactive. Family present. To CT then to 5N25.

## 2023-12-26 NOTE — ED Notes (Signed)
 SDH/SAH noted, Neuro MD present

## 2023-12-26 NOTE — ED Notes (Addendum)
 From CT to ED 18, HOB 30 degrees

## 2023-12-26 NOTE — Consult Note (Signed)
 NEUROLOGY CONSULT NOTE   Date of service: December 26, 2023 Patient Name: Raymond Christensen MRN:  [id number] DOB:  30-May-1940 Chief Complaint: Aphasia Requesting Provider: Doretha Folks, MD  History of Present Illness  Raymond Christensen is a 83 y.o. male with hx of CAD, A-fib on Eliquis , status post mitral clip, amongst other comorbidities listed below, presenting for evaluation of speech difficulties that started this morning.  Per EMS report, he woke up normally at 730 this morning and around 9 AM family started noticing that he has been having trouble with his words and his speech.  EMS was called.  They noticed that he has word salad with longer sentences and activated a code stroke.  He was evaluated on the ED bridge. Of note, he had a fall a few days ago but did not seek medical attention. Unclear if he had hit his head at that time  LKW: 9 AM Modified rankin score: 0-Completely asymptomatic and back to baseline post- stroke IV Thrombolysis: Traumatic ICH, SDH and SAH EVT: Traumatic SDH, SAH  NIHSS components Score: Comment  1a Level of Conscious 0[x]  1[]  2[]  3[]      1b LOC Questions 0[x]  1[]  2[]       1c LOC Commands 0[x]  1[]  2[]       2 Best Gaze 0[x]  1[]  2[]       3 Visual 0[x]  1[]  2[]  3[]      4 Facial Palsy 0[]  1[x]  2[]  3[]      5a Motor Arm - left 0[x]  1[]  2[]  3[]  4[]  UN[]    5b Motor Arm - Right 0[x]  1[]  2[]  3[]  4[]  UN[]    6a Motor Leg - Left 0[x]  1[]  2[]  3[]  4[]  UN[]    6b Motor Leg - Right 0[x]  1[]  2[]  3[]  4[]  UN[]    7 Limb Ataxia 0[x]  1[]  2[]  UN[]      8 Sensory 0[x]  1[]  2[]  UN[]      9 Best Language 0[]  1[]  2[x]  3[]      10 Dysarthria 0[]  1[x]  2[]  UN[]      11 Extinct. and Inattention 0[x]  1[]  2[]       TOTAL: 4      ROS   Unable to ascertain due to aphasia  Past History   Past Medical History:  Diagnosis Date   Arthritis    CAD (coronary artery disease)    Cancer (HCC)    skin lt. arm.   Carotid artery disease    s/p left CEA 01/17/14   Chronic systolic CHF  (congestive heart failure) [facility])    Concussion Summer 2012   Essential hypertension 02/26/2017   Heart disease    Hyperlipidemia    Hypothyroidism 10/15/2017   Major depressive disorder    Mitral valve regurgitation    Myocardial infarction [facility]) 1996, 2021   Obstructive sleep apnea on CPAP    Persistent atrial fibrillation (HCC)    Poor flexibility of tendon 12/01/2018   S/P mitral valve clip implantation 12/23/2019   s/p TEER with MitraClip with one XTW by Dr. Wonda   Subungual hematoma of digit of hand 12/01/2018   Subungual hematoma of right foot 12/01/2018   Type 2 diabetes mellitus without complication, without long-term current use of insulin  (HCC) 02/26/2017    Past Surgical History:  Procedure Laterality Date   ATRIAL FIBRILLATION ABLATION N/A 02/01/2020   Procedure: ATRIAL FIBRILLATION ABLATION;  Surgeon: Inocencio Soyla Lunger, MD;  Location: [facility] INVASIVE CV LAB;  Service: Cardiovascular;  Laterality: N/A;   BYPASS GRAFT  2001   CARDIAC CATHETERIZATION  CARDIOVERSION N/A 11/08/2019   Procedure: CARDIOVERSION;  Surgeon: Delford Maude BROCKS, MD;  Location: [facility] ENDOSCOPY;  Service: Cardiovascular;  Laterality: N/A;   CAROTID ENDARTERECTOMY Left 01/17/2014   CORONARY ANGIOPLASTY     CORONARY ARTERY BYPASS GRAFT     LEFT HEART CATH AND CORS/GRAFTS ANGIOGRAPHY N/A 06/26/2022   Procedure: LEFT HEART CATH AND CORS/GRAFTS ANGIOGRAPHY;  Surgeon: Burnard Debby LABOR, MD;  Location: [facility] INVASIVE CV LAB;  Service: Cardiovascular;  Laterality: N/A;   RIGHT/LEFT HEART CATH AND CORONARY/GRAFT ANGIOGRAPHY N/A 09/09/2019   Procedure: RIGHT/LEFT HEART CATH AND CORONARY/GRAFT ANGIOGRAPHY;  Surgeon: Court Dorn PARAS, MD;  Location: [facility] INVASIVE CV LAB;  Service: Cardiovascular;  Laterality: N/A;   TEE WITHOUT CARDIOVERSION N/A 09/13/2019   Procedure: TRANSESOPHAGEAL ECHOCARDIOGRAM (TEE);  Surgeon: Lonni Slain, MD;  Location: [facility] ENDOSCOPY;  Service: Cardiovascular;  Laterality: N/A;   TEE  WITHOUT CARDIOVERSION N/A 12/23/2019   Procedure: TRANSESOPHAGEAL ECHOCARDIOGRAM (TEE);  Surgeon: Wonda Sharper, MD;  Location: [facility] INVASIVE CV LAB;  Service: Cardiovascular;  Laterality: N/A;   TEE WITHOUT CARDIOVERSION  02/01/2020   Procedure: TRANSESOPHAGEAL ECHOCARDIOGRAM (TEE);  Surgeon: Inocencio Soyla Lunger, MD;  Location: [facility] INVASIVE CV LAB;  Service: Cardiovascular;;   TOTAL HIP ARTHROPLASTY  1994, 1995   TOTAL HIP ARTHROPLASTY Bilateral    TRANSCATHETER MITRAL EDGE TO EDGE REPAIR N/A 12/23/2019   Procedure: MITRAL VALVE REPAIR;  Surgeon: Wonda Sharper, MD;  Location: [facility] INVASIVE CV LAB;  Service: Cardiovascular;  Laterality: N/A;   TRANSURETHRAL RESECTION OF PROSTATE N/A 12/27/2022   Procedure: TRANSURETHRAL RESECTION OF THE PROSTATE (TURP);  Surgeon: Nieves Cough, MD;  Location: [facility] ORS;  Service: Urology;  Laterality: N/A;  75 minute case    Family History: Family History  Problem Relation Age of Onset   Dementia Father        Concerns surrounding Lewy body dementia, but never officially diagnosed   Cancer Neg Hx     Social History  reports that he quit smoking about 45 years ago. His smoking use included cigarettes. He has never used smokeless tobacco. He reports current alcohol use of about 1.0 standard drink of alcohol per week. He reports that he does not use drugs.  Allergies[1]  Medications  Current Medications[2]  Vitals   There were no vitals filed for this visit.  There is no height or weight on file to calculate BMI.   Physical Exam   General: Awake alert HEENT: Bruising around the right eye CVS: Regular rate rhythm Abdomen: Extensive bruising around the right hemipelvis, scrotum and penis   Neurologic Examination  He is awake alert oriented to self, the month, and his age.  His speech is mildly dysarthric.  Naming is intact, comprehension is intact, fluency is impaired and on further talking to him, he has a complete word salad with saying  anything more than 2 or 3 word sentences. Cranial nerves: Pupils equal round react light, extract movements intact, visual fields full, face appears asymmetric with mild right facial weakness, tongue and palate midline Motor examination reveals no drift in any of the 4 extremities although the right lower extremity appears mildly weaker than the left. Sensation intact to light touch Coordination examination reveals no dysmetria  Labs/Imaging/Neurodiagnostic studies   CBC:  Recent Labs  Lab 01-23-24 1039  HGB 12.6*  HCT 37.0*   Basic Metabolic Panel:  Lab Results  Component Value Date   NA 140 2024/01/23   K 4.5 01-23-2024   CO2 29 12/22/2023   GLUCOSE 116 (H) 23-Jan-2024  BUN 15 12/26/2023   CREATININE 1.10 12/26/2023   CALCIUM  9.0 12/22/2023   GFRNONAA >60 06/27/2022   GFRAA 49 (L) 01/13/2020   Lipid Panel:  Lab Results  Component Value Date   LDLCALC 48 12/22/2023   HgbA1c:  Lab Results  Component Value Date   HGBA1C 5.5 12/22/2023    INR  Lab Results  Component Value Date   INR 1.2 06/26/2022   APTT  Lab Results  Component Value Date   APTT 68 (H) 06/27/2022    CT Head without contrast(Personally reviewed): Left hemispheric SAH, SDH and ICH-given history of trauma, traumatic etiology most likely.  Radiology was concern for a possible left MCA aneurysm.  CT angiography done-no rhythm identified.  CTA head neck - no LVO, no aneurysm  ASSESSMENT   Raymond Christensen is a 83 y.o. male on Eliquis , who had a fall last Saturday, now presents with speech difficulty.  On examination he has aphasia with word salad. CT head demonstrates left hemispheric subarachnoid hemorrhage with mild subdural hemorrhage and temporal lobe ICH/contusion.  Given the history of fall and Eliquis , this is likely traumatic in the setting of coagulopathy.  Also had elevated blood pressures which might have been contributing to the increasing size of this bleed.  IMP Traumatic ICH, SDH,  SAH HTN Emergency Coagulopathy due to DOAC use at home   RECOMMENDATIONS  Emergent reversal of Eliquis  with Andexxa. Pharmacy aware, ED aware Stat neurosurgery consult BP 130-150. No antiplatelets or AC SCD for DVT PPX May need ICU for BP control - will defer to ED and Neurosurgical evaluation  Plan discussed with neuroradiology and ED provider Dr. Doretha Inpatient neurology will be available as needed.  Please call with questions ______________________________________________________________________    Signed, Eligio Lav, MD [facility]   CRITICAL CARE ATTESTATION Performed by: Eligio Lav, MD Total critical care time: 55 minutes Critical care time was exclusive of separately billable procedures and treating other patients and/or supervising APPs/Residents/Students Critical care was necessary to treat or prevent imminent or life-threatening deterioration. This patient is critically ill and at significant risk for neurological worsening and/or death and care requires constant monitoring. Critical care was time spent personally by me on the following activities: development of treatment plan with patient and/or surrogate as well as nursing, discussions with consultants, evaluation of patient's response to treatment, examination of patient, obtaining history from patient or surrogate, ordering and performing treatments and interventions, ordering and review of laboratory studies, ordering and review of radiographic studies, pulse oximetry, re-evaluation of patient's condition, participation in multidisciplinary rounds and medical decision making of high complexity in the care of this patient.     [1]  Allergies Allergen Reactions   Sulfa Antibiotics Rash    Questionable, he developed a diffuse rash 2 days after stopping Bactrim  [2]  Current Facility-Administered Medications:    sodium chloride  flush (NS) 0.9 % injection 3 mL, 3 mL, Intravenous, Once, Doretha Folks, MD  Current Outpatient Medications:    acetaminophen  (TYLENOL ) 500 MG tablet, Take 1,000 mg by mouth every 6 (six) hours as needed for moderate pain or headache., Disp: , Rfl:    atorvastatin  (LIPITOR ) 80 MG tablet, TAKE 1 TABLET BY MOUTH DAILY, Disp: 90 tablet, Rfl: 3   buPROPion  (WELLBUTRIN  XL) 150 MG 24 hr tablet, 1 qam  for  1 week then 2 qam, Disp: 60 tablet, Rfl: 4   ELIQUIS  5 MG TABS tablet, TAKE 1 TABLET BY MOUTH TWICE  DAILY, Disp: 180 tablet, Rfl: 3  ezetimibe  (ZETIA ) 10 MG tablet, TAKE 1 TABLET BY MOUTH DAILY, Disp: 90 tablet, Rfl: 3   finasteride  (PROSCAR ) 5 MG tablet, Take 1 tablet (5 mg total) by mouth daily., Disp: 90 tablet, Rfl: 3   fluorouracil (EFUDEX) 5 % cream, Apply 1 Application topically 2 (two) times daily., Disp: , Rfl:    fluticasone  (FLONASE ) 50 MCG/ACT nasal spray, Place 2 sprays into both nostrils daily., Disp: 16 g, Rfl: 6   furosemide  (LASIX ) 20 MG tablet, Take 1 tablet (20 mg total) by mouth daily as needed (leg swelling)., Disp: 30 tablet, Rfl: 11   GEMTESA 75 MG TABS, Take 1 tablet by mouth daily., Disp: , Rfl:    levothyroxine  (SYNTHROID ) 75 MCG tablet, TAKE 1 TABLET BY MOUTH DAILY, Disp: 90 tablet, Rfl: 1   liothyronine  (CYTOMEL ) 5 MCG tablet, TAKE 2 TABLETS BY MOUTH DAILY, Disp: 180 tablet, Rfl: 1   LORazepam  (ATIVAN ) 0.5 MG tablet, 1 bid, Disp: 60 tablet, Rfl: 4   metoprolol  succinate (TOPROL -XL) 25 MG 24 hr tablet, Take 0.5 tablets (12.5 mg total) by mouth daily., Disp: 45 tablet, Rfl: 3   Multiple Vitamins-Minerals (MULTIVITAMIN ADULT EXTRA C PO), Take 1 tablet by mouth daily. , Disp: , Rfl:    nitroGLYCERIN  (NITROSTAT ) 0.4 MG SL tablet, Place 1 tablet (0.4 mg total) under the tongue every 5 (five) minutes as needed for chest pain. (Patient not taking: Reported on 10/10/2023), Disp: 30 tablet, Rfl: 12   OZEMPIC , 2 MG/DOSE, 8 MG/3ML SOPN, INJECT SUBCUTANEOUSLY 2 MG EVERY WEEK ON SUNDAY AS DIRECTED, Disp: 9 mL, Rfl: 3   PARoxetine  (PAXIL ) 10 MG  tablet, TAKE 1 TABLET BY MOUTH DAILY, Disp: 90 tablet, Rfl: 0

## 2023-12-26 NOTE — ED Notes (Addendum)
 Back to room 18, wife at [facility]. No changes. Alert, NAD, calm, interactive, speech clear, word finding issues remain. HOB 30 degrees.

## 2023-12-26 NOTE — ED Provider Notes (Signed)
 [name]   CSN: [id number] Arrival date & time: 12/26/23  1033     Patient presents with: Code Stroke   Raymond Christensen is a 83 y.o. male.   Pt is a 83 y.o. male with CAD and prior graft failure in 2024, MR s/p MitraClip, bleeding after TURP and PAF on eliquis  as well as CHF with reduced EF stable at 45% who is presenting today as a code stroke for sudden onset of expressive aphasia.  Patient does report on Saturday he had a syncopal event where he fell and hit the floor.  He cannot remember what happened but remembers waking up on the floor.  Since that time he had bruising around his right eye and bruising in his left groin area.  He has been able to stand and walk denies any neck pain, chest pain or abdominal pain.  He denied any weakness in his arms or his legs and states suddenly this morning he started having difficulty speaking.  He has had a mild headache but denies any vision changes.  No nausea or vomiting.  He does take Eliquis  and had his last dose this morning.  Patient arrived as a code stroke with stroke team present upon arrival.  Patient's blood sugar was stable blood pressure was elevated.  The history is provided by the patient. The history is limited by the condition of the patient.       Prior to Admission medications  Medication Sig Start Date End Date Taking? Authorizing Provider  acetaminophen  (TYLENOL ) 500 MG tablet Take 1,000 mg by mouth every 6 (six) hours as needed for moderate pain or headache.    [provider]  atorvastatin  (LIPITOR ) 80 MG tablet TAKE 1 TABLET BY MOUTH DAILY 12/04/23   Chandrasekhar, Mahesh A, MD  buPROPion  (WELLBUTRIN  XL) 150 MG 24 hr tablet 1 qam  for  1 week then 2 qam 12/16/23   Plovsky, Elna, MD  ELIQUIS  5 MG TABS tablet TAKE 1 TABLET BY MOUTH TWICE  DAILY 06/24/23   Chandrasekhar, Mahesh A, MD  ezetimibe  (ZETIA ) 10 MG tablet TAKE 1 TABLET BY MOUTH DAILY 12/04/23    Chandrasekhar, Mahesh A, MD  finasteride  (PROSCAR ) 5 MG tablet Take 1 tablet (5 mg total) by mouth daily. 10/16/22   Shona Layman BROCKS, MD  fluorouracil (EFUDEX) 5 % cream Apply 1 Application topically 2 (two) times daily. 09/18/23   [provider]  fluticasone  (FLONASE ) 50 MCG/ACT nasal spray Place 2 sprays into both nostrils daily. 10/08/23   Frann Mabel Mt, DO  furosemide  (LASIX ) 20 MG tablet Take 1 tablet (20 mg total) by mouth daily as needed (leg swelling). 10/10/23   Chandrasekhar, Mahesh A, MD  GEMTESA 75 MG TABS Take 1 tablet by mouth daily. 09/30/23   [provider]  levothyroxine  (SYNTHROID ) 75 MCG tablet TAKE 1 TABLET BY MOUTH DAILY 10/20/23   Frann Mabel Mt, DO  liothyronine  (CYTOMEL ) 5 MCG tablet TAKE 2 TABLETS BY MOUTH DAILY 08/27/23   Frann, Mabel Mt, DO  LORazepam  (ATIVAN ) 0.5 MG tablet 1 bid 07/29/23   Plovsky, Elna, MD  metoprolol  succinate (TOPROL -XL) 25 MG 24 hr tablet Take 0.5 tablets (12.5 mg total) by mouth daily. 04/30/23   Frann Mabel Mt, DO  Multiple Vitamins-Minerals (MULTIVITAMIN ADULT EXTRA C PO) Take 1 tablet by mouth daily.     [provider]  nitroGLYCERIN  (NITROSTAT ) 0.4 MG SL tablet Place 1 tablet (0.4 mg total) under the tongue  every 5 (five) minutes as needed for chest pain. Patient not taking: Reported on 10/10/2023 06/21/22   Frann Mabel Mt, DO  OZEMPIC , 2 MG/DOSE, 8 MG/3ML SOPN INJECT SUBCUTANEOUSLY 2 MG EVERY WEEK ON SUNDAY AS DIRECTED 08/04/23   Frann Mabel Mt, DO  PARoxetine  (PAXIL ) 10 MG tablet TAKE 1 TABLET BY MOUTH DAILY 12/01/23   Frann Mabel Mt, DO    Allergies: Sulfa antibiotics    Review of Systems  Updated Vital Signs BP (!) 161/103   Pulse 75   Resp (!) 22   SpO2 100%   Physical Exam Vitals and nursing note reviewed.  Constitutional:      General: He is not in acute distress.    Appearance: He is well-developed.  HENT:     Head: Normocephalic.      Comments: Healing ecchymosis around the right eye Eyes:     Conjunctiva/sclera: Conjunctivae normal.     Pupils: Pupils are equal, round, and reactive to light.  Cardiovascular:     Rate and Rhythm: Normal rate and regular rhythm.     Pulses: Normal pulses.     Heart sounds: No murmur heard. Pulmonary:     Effort: Pulmonary effort is normal. No respiratory distress.     Breath sounds: Normal breath sounds. No wheezing or rales.  Abdominal:     General: There is no distension.     Palpations: Abdomen is soft.     Tenderness: There is no abdominal tenderness. There is no guarding or rebound.     Comments: Significant ecchymosis present in the pelvis and groin area encompassing the left upper leg onto the scrotum and penis in stages of healing.  No abdominal pain  Musculoskeletal:        General: No tenderness. Normal range of motion.     Cervical back: Normal range of motion and neck supple. No tenderness.     Right lower leg: No edema.     Left lower leg: No edema.     Comments: Full movement of bilateral lower extremities.  No significant hip pain with full range of motion.  Hematoma noted in the groin.  Skin:    General: Skin is warm and dry.     Findings: No erythema or rash.  Neurological:     Mental Status: He is alert and oriented to person, place, and time.     Cranial Nerves: Dysarthria present. No facial asymmetry.     Sensory: Sensation is intact.     Motor: Motor function is intact. No pronator drift.     Coordination: Coordination is intact.     Comments: Significant expressive aphasia  Psychiatric:        Behavior: Behavior normal.     (all labs ordered are listed, but only abnormal results are displayed) Labs Reviewed  PROTIME-INR - Abnormal; Notable for the following components:      Result Value   Prothrombin Time 15.7 (*)    All other components within normal limits  APTT - Abnormal; Notable for the following components:   aPTT 38 (*)    All other  components within normal limits  CBC - Abnormal; Notable for the following components:   Hemoglobin 12.4 (*)    HCT 38.4 (*)    All other components within normal limits  COMPREHENSIVE METABOLIC PANEL WITH GFR - Abnormal; Notable for the following components:   Glucose, Bld 113 (*)    Calcium  8.6 (*)    Total Protein 6.2 (*)  AST 44 (*)    Total Bilirubin 1.5 (*)    All other components within normal limits  PROTIME-INR - Abnormal; Notable for the following components:   Prothrombin Time 16.4 (*)    INR 1.3 (*)    All other components within normal limits  APTT - Abnormal; Notable for the following components:   aPTT 39 (*)    All other components within normal limits  HEPARIN  LEVEL (UNFRACTIONATED) - Abnormal; Notable for the following components:   Heparin  Unfractionated >1.10 (*)    All other components within normal limits  I-STAT CHEM 8, ED - Abnormal; Notable for the following components:   Glucose, Bld 116 (*)    Calcium , Ion 1.10 (*)    Hemoglobin 12.6 (*)    HCT 37.0 (*)    All other components within normal limits  CBG MONITORING, ED - Abnormal; Notable for the following components:   Glucose-Capillary 116 (*)    All other components within normal limits  DIFFERENTIAL  ETHANOL  TYPE AND SCREEN    EKG: None  Radiology:    Procedures   Medications Ordered in the ED  clevidipine (CLEVIPREX) infusion 0.5 mg/mL (0 mg/hr Intravenous Stopped 12/26/23 1106)  sodium chloride  flush (NS) 0.9 % injection 3 mL (3 mLs Intravenous Given 12/26/23 1127)  actidose-Aqua suspension 50 g (50 g Oral Given 12/26/23 1144)  coag fact Xa recombinant (ANDEXXA) high dose infusion 1800 mg (1,800 mg Intravenous New Bag/Given 12/26/23 1132)  hydrALAZINE  (APRESOLINE ) injection 20 mg (20 mg Intravenous Given 12/26/23 1047)  iohexol  (OMNIPAQUE ) 350 MG/ML injection 75 mL (75 mLs Intravenous Contrast Given 12/26/23 1122)                                    Medical Decision  Making Amount and/or Complexity of Data Reviewed Independent Historian: EMS External Data Reviewed: notes. Labs: ordered. Decision-making details documented in ED Course. Radiology: ordered and independent interpretation performed. Decision-making details documented in ED Course. ECG/medicine tests: ordered and independent interpretation performed. Decision-making details documented in ED Course.  Risk OTC drugs. Prescription drug management.   Pt with multiple medical problems and comorbidities and presenting today with a complaint that caries a high risk for morbidity and mortality.  Here today with the abrupt onset of aphasia.  This is also in light of a fall on Saturday on anticoagulation.  Based on patient's description although difficult to follow due to his aphasia sounds like patient had a syncopal event possibly vasovagal.  Denies any chest pain or shortness of breath.  Concern for stroke, intracranial hemorrhage, possibility for hypoglycemia or electrolyte abnormality.  Patient has significant aphasia upon arrival here but no other obvious deficits.  No nausea or vomiting or change in mental status.  Patient last took Eliquis  this morning.  Patient immediately went over to the scanner.  Blood sugar was stable and blood pressure was elevated.  I have independently visualized and interpreted pt's images today.  Head CT concerning for intracranial hemorrhage with significant blood in the left side of the brain even though trauma is present on the right.  Radiology report Acute left hemisphere hemorrhage with suspected subarachnoid and intra-axial components (estimated 10 mL in left temporal lobe) with some regional hypodense edema. Consider ruptured Left MCA aneurysm. And No midline shift or other complicating features. No other acute intracranial abnormality.  Patient will return to radiology for a CTA to evaluate if there is  evidence of an aneurysm. I independently interpreted patient's labs  and EKG.  Chem-8 without acute findings, patient's INR is normal, CBC with a hemoglobin of 12.4 normal white count and platelet count, Blood sugar of 116.  CMP without acute findings.  EKG without acute findings. Patient was given Andexxa for Eliquis  reversal as the last dose he took was this morning and in light of the bleed.  He typically takes this for paroxysmal atrial fibrillation.  Also patient was started on Cleviprex for blood pressure control. Patient's blood pressure is now in the 130s systolics and Cleviprex has been turned off.  CTA was negative for any type of aneurysm.  On reevaluation patient is still having significant expressive aphasia but no other developing symptoms.  All the findings discussed with the patient and his wife.  Consulted neurosurgery who recommended repeat CT in 6 hours, blood pressure control.  Patient will need admission to the ICU.SABRA  CRITICAL CARE Performed by: Getsemani Lindon Total critical care time: 45 minutes Critical care time was exclusive of separately billable procedures and treating other patients. Critical care was necessary to treat or prevent imminent or life-threatening deterioration. Critical care was time spent personally by me on the following activities: development of treatment plan with patient and/or surrogate as well as nursing, discussions with consultants, evaluation of patient's response to treatment, examination of patient, obtaining history from patient or surrogate, ordering and performing treatments and interventions, ordering and review of laboratory studies, ordering and review of radiographic studies, pulse oximetry and re-evaluation of patient's condition.      Final diagnoses:  Traumatic subarachnoid hemorrhage with unknown loss of consciousness status, initial encounter [facility])  Expressive aphasia    ED Discharge Orders     None          Doretha Folks, MD 12/26/23 1215

## 2023-12-26 NOTE — ED Notes (Signed)
EDP at [facility] speaking with pt and spouse

## 2023-12-26 NOTE — ED Notes (Addendum)
 CTA in progress, neuro MD present in scanner room, with stroke RN. Endorses a little HA, denies dizziness or nausea

## 2023-12-26 NOTE — ED Notes (Signed)
 Hydralazine  given enroute to ED 18

## 2023-12-26 NOTE — H&P (Addendum)
 NAME:  Raymond Christensen, MRN:  [id number], DOB:  [birth year], LOS: 0 ADMISSION DATE:  12/26/2023, CONSULTATION DATE:  12/26/2023 REFERRING MD:  EDP, CHIEF COMPLAINT:  Aphasia   History of Present Illness:  83 year old male with past medical history of paroxysmal A-fib on Eliquis  last dose on day of presentation in the morning, CAD, heart failure with midrange ejection fraction 45% who presented to the hospital as a code stroke due to sudden onset expressive aphasia.  History obtained from patient, family and chart review.  Patient had a syncopal episode 6 days prior to his presentation and fell and hit the floor with bruising on his head around his right eye.  He is not able to recall what exactly happened.  On presentation to the hospital, he was noted to have significant expressive aphasia.  Code stroke and sent for head CT with findings concerning for acute left hemisphere hemorrhage with subarachnoid and intra-axial component.  Neurosurgery was consulted and they recommended repeat head CT in 6 hours.   Pertinent  Medical History  As above   Significant Hospital Events: Including procedures, antibiotic start and stop dates in addition to other pertinent events   Admitted to the ICU for ongoing medical care 12/26/23  Interim History / Subjective:  Patient complaining of mild headache and continues to have aphasia   Objective    Blood pressure (!) 161/103, pulse 75, resp. rate (!) 22, SpO2 100%.        Intake/Output Summary (Last 24 hours) at 12/26/2023 1304 Last data filed at 12/26/2023 1216 Gross per 24 hour  Intake --  Output 350 ml  Net -350 ml   There were no vitals filed for this visit.  Examination: General:  elderly male, not in distress  HENT:  PERRLA, EOMI  Lungs: Clear to auscultation Cardiovascular: RRR, Nl S1,S2 Abdomen: soft, NT Extremities: groin hematoma, warm, well perfused  Neuro: No focal deficits except for aphasia. Motor and sensation intact,  cranial nerves intact    Resolved problem list   Assessment and Plan   #Traumatic left temporal ICH #Traumatic SAH  #Afib on eliquis   (eliquis  already reversed) #HF with mid range EF   - Plan to admit to the ICU  - Monitor Electrolytes  - Q1h neurochecks  - BP goal of <160  -  SCD for DVT ppx, no chemical DVT ppx - Hold off nimodipine and TCDs (no evidence of aneurysmal bleed) - Bowel reg, PPI - PT/OT, SLP - Operative management decision based on repeat CT and NSGY recs  - Currently protecting airway  Labs   CBC: Recent Labs  Lab 12/26/23 1035 12/26/23 1039  WBC 8.6  --   NEUTROABS 5.9  --   HGB 12.4* 12.6*  HCT 38.4* 37.0*  MCV 89.3  --   PLT 170  --     Basic Metabolic Panel: Recent Labs  Lab 12/22/23 1352 12/26/23 1035 12/26/23 1039  NA 141 135 140  K 4.1 4.8 4.5  CL 106 101 102  CO2 29 26  --   GLUCOSE 74 113* 116*  BUN 25* 13 15  CREATININE 0.91 1.00 1.10  CALCIUM  9.0 8.6*  --    GFR: Estimated Creatinine Clearance: 56.5 mL/min (by C-G formula based on SCr of 1.1 mg/dL). Recent Labs  Lab 12/26/23 1035  WBC 8.6    Liver Function Tests: Recent Labs  Lab 12/22/23 1352 12/26/23 1035  AST 19 44*  ALT 21 21  ALKPHOS 60 54  BILITOT 0.7 1.5*  PROT 6.3 6.2*  ALBUMIN  4.2 3.7   No results for input(s): LIPASE, AMYLASE in the last 168 hours. No results for input(s): AMMONIA in the last 168 hours.  ABG    Component Value Date/Time   PHART 7.437 12/21/2019 1402   PCO2ART 36.9 12/21/2019 1402   PO2ART 90.7 12/21/2019 1402   HCO3 24.4 12/21/2019 1402   TCO2 27 12/26/2023 1039   O2SAT 97.2 12/21/2019 1402     Coagulation Profile: Recent Labs  Lab 12/26/23 1035 12/26/23 1052  INR 1.2 1.3*    Cardiac Enzymes: No results for input(s): CKTOTAL, CKMB, CKMBINDEX, TROPONINI in the last 168 hours.  HbA1C: Hgb A1c MFr Bld  Date/Time Value Ref Range Status  12/22/2023 01:52 PM 5.5 4.6 - 6.5 % Final    Comment:    Glycemic  Control Guidelines for People with Diabetes:Non Diabetic:  <6%Goal of Therapy: <7%Additional Action Suggested:  >8%   06/24/2023 11:18 AM 5.6 4.6 - 6.5 % Final    Comment:    Glycemic Control Guidelines for People with Diabetes:Non Diabetic:  <6%Goal of Therapy: <7%Additional Action Suggested:  >8%     CBG: Recent Labs  Lab 12/26/23 1035  GLUCAP 116*    Review of Systems:   As mentioned above   Past Medical History:  He,  has a past medical history of Arthritis, CAD (coronary artery disease), Cancer (HCC), Carotid artery disease, Chronic systolic CHF (congestive heart failure) (HCC), Concussion (Summer 2012), Essential hypertension (02/26/2017), Heart disease, Hyperlipidemia, Hypothyroidism (10/15/2017), Major depressive disorder, Mitral valve regurgitation, Myocardial infarction (HCC) (1996, 2021), Obstructive sleep apnea on CPAP, Persistent atrial fibrillation (HCC), Poor flexibility of tendon (12/01/2018), S/P mitral valve clip implantation (12/23/2019), Subungual hematoma of digit of hand (12/01/2018), Subungual hematoma of right foot (12/01/2018), and Type 2 diabetes mellitus without complication, without long-term current use of insulin  (HCC) (02/26/2017).   Surgical History:   Past Surgical History:  Procedure Laterality Date   ATRIAL FIBRILLATION ABLATION N/A 02/01/2020   Procedure: ATRIAL FIBRILLATION ABLATION;  Surgeon: Inocencio Soyla Lunger, MD;  Location: [facility] INVASIVE CV LAB;  Service: Cardiovascular;  Laterality: N/A;   BYPASS GRAFT  2001   CARDIAC CATHETERIZATION     CARDIOVERSION N/A 11/08/2019   Procedure: CARDIOVERSION;  Surgeon: Delford Maude BROCKS, MD;  Location: [facility] ENDOSCOPY;  Service: Cardiovascular;  Laterality: N/A;   CAROTID ENDARTERECTOMY Left 01/17/2014   CORONARY ANGIOPLASTY     CORONARY ARTERY BYPASS GRAFT     LEFT HEART CATH AND CORS/GRAFTS ANGIOGRAPHY N/A 06/26/2022   Procedure: LEFT HEART CATH AND CORS/GRAFTS ANGIOGRAPHY;  Surgeon: Burnard Debby LABOR, MD;   Location: [facility] INVASIVE CV LAB;  Service: Cardiovascular;  Laterality: N/A;   RIGHT/LEFT HEART CATH AND CORONARY/GRAFT ANGIOGRAPHY N/A 09/09/2019   Procedure: RIGHT/LEFT HEART CATH AND CORONARY/GRAFT ANGIOGRAPHY;  Surgeon: Court Dorn PARAS, MD;  Location: [facility] INVASIVE CV LAB;  Service: Cardiovascular;  Laterality: N/A;   TEE WITHOUT CARDIOVERSION N/A 09/13/2019   Procedure: TRANSESOPHAGEAL ECHOCARDIOGRAM (TEE);  Surgeon: Lonni Slain, MD;  Location: [facility] ENDOSCOPY;  Service: Cardiovascular;  Laterality: N/A;   TEE WITHOUT CARDIOVERSION N/A 12/23/2019   Procedure: TRANSESOPHAGEAL ECHOCARDIOGRAM (TEE);  Surgeon: Wonda Sharper, MD;  Location: [facility] INVASIVE CV LAB;  Service: Cardiovascular;  Laterality: N/A;   TEE WITHOUT CARDIOVERSION  02/01/2020   Procedure: TRANSESOPHAGEAL ECHOCARDIOGRAM (TEE);  Surgeon: Inocencio Soyla Lunger, MD;  Location: [facility] INVASIVE CV LAB;  Service: Cardiovascular;;   TOTAL HIP ARTHROPLASTY  1994, 1995   TOTAL HIP ARTHROPLASTY Bilateral    TRANSCATHETER MITRAL EDGE  TO EDGE REPAIR N/A 12/23/2019   Procedure: MITRAL VALVE REPAIR;  Surgeon: Wonda Sharper, MD;  Location: [facility] INVASIVE CV LAB;  Service: Cardiovascular;  Laterality: N/A;   TRANSURETHRAL RESECTION OF PROSTATE N/A 12/27/2022   Procedure: TRANSURETHRAL RESECTION OF THE PROSTATE (TURP);  Surgeon: Nieves Cough, MD;  Location: [facility] ORS;  Service: Urology;  Laterality: N/A;  75 minute case     Social History:   reports that he quit smoking about 45 years ago. His smoking use included cigarettes. He has never used smokeless tobacco. He reports current alcohol use of about 1.0 standard drink of alcohol per week. He reports that he does not use drugs.   Family History:  His family history includes Dementia in his father. There is no history of Cancer.   Allergies Allergies[1]   Home Medications  Prior to Admission medications  Medication Sig Start Date End Date Taking? Authorizing Provider  acetaminophen   (TYLENOL ) 500 MG tablet Take 1,000 mg by mouth every 6 (six) hours as needed for moderate pain or headache.    [provider]  atorvastatin  (LIPITOR ) 80 MG tablet TAKE 1 TABLET BY MOUTH DAILY 12/04/23   Santo Kelly A, MD  buPROPion  (WELLBUTRIN  XL) 150 MG 24 hr tablet 1 qam  for  1 week then 2 qam 12/16/23   Plovsky, Elna, MD  ELIQUIS  5 MG TABS tablet TAKE 1 TABLET BY MOUTH TWICE  DAILY 06/24/23   Chandrasekhar, Mahesh A, MD  ezetimibe  (ZETIA ) 10 MG tablet TAKE 1 TABLET BY MOUTH DAILY 12/04/23   Chandrasekhar, Mahesh A, MD  finasteride  (PROSCAR ) 5 MG tablet Take 1 tablet (5 mg total) by mouth daily. 10/16/22   Shona Layman BROCKS, MD  fluorouracil (EFUDEX) 5 % cream Apply 1 Application topically 2 (two) times daily. 09/18/23   [provider]  fluticasone  (FLONASE ) 50 MCG/ACT nasal spray Place 2 sprays into both nostrils daily. 10/08/23   Frann Mabel Mt, DO  furosemide  (LASIX ) 20 MG tablet Take 1 tablet (20 mg total) by mouth daily as needed (leg swelling). 10/10/23   Chandrasekhar, Mahesh A, MD  GEMTESA 75 MG TABS Take 1 tablet by mouth daily. 09/30/23   [provider]  levothyroxine  (SYNTHROID ) 75 MCG tablet TAKE 1 TABLET BY MOUTH DAILY 10/20/23   Frann Mabel Mt, DO  liothyronine  (CYTOMEL ) 5 MCG tablet TAKE 2 TABLETS BY MOUTH DAILY 08/27/23   Frann, Mabel Mt, DO  LORazepam  (ATIVAN ) 0.5 MG tablet 1 bid 07/29/23   Plovsky, Elna, MD  metoprolol  succinate (TOPROL -XL) 25 MG 24 hr tablet Take 0.5 tablets (12.5 mg total) by mouth daily. 04/30/23   Frann Mabel Mt, DO  Multiple Vitamins-Minerals (MULTIVITAMIN ADULT EXTRA C PO) Take 1 tablet by mouth daily.     [provider]  nitroGLYCERIN  (NITROSTAT ) 0.4 MG SL tablet Place 1 tablet (0.4 mg total) under the tongue every 5 (five) minutes as needed for chest pain. Patient not taking: Reported on 10/10/2023 06/21/22   Frann Mabel Mt, DO  OZEMPIC , 2 MG/DOSE, 8 MG/3ML SOPN INJECT  SUBCUTANEOUSLY 2 MG EVERY WEEK ON SUNDAY AS DIRECTED 08/04/23   Frann Mabel Mt, DO  PARoxetine  (PAXIL ) 10 MG tablet TAKE 1 TABLET BY MOUTH DAILY 12/01/23   Frann Mabel Mt, DO      The patient is critically ill due to traumatic intracranial bleeding with subarachnoid hemorrahge, requiring frequent neurochecks.  Critical care was necessary to treat or prevent imminent or life-threatening deterioration. Critical care time was spent by me on the following activities:  development of a treatment plan with the patient and/or surrogate as well as nursing, discussions with consultants, evaluation of the patient's response to treatment, examination of the patient, obtaining a history from the patient or surrogate, ordering and performing treatments and interventions, ordering and review of laboratory studies, ordering and review of radiographic studies, review of telemetry data including pulse oximetry, re-evaluation of patient's condition and participation in multidisciplinary rounds.   I personally spent 34 minutes providing critical care not including any separately billable procedures.   Zola LOISE Herter, MD [facility] 12/26/2023 3:14 PM          [1]  Allergies Allergen Reactions   Sulfa Antibiotics Rash    Questionable, he developed a diffuse rash 2 days after stopping Bactrim

## 2023-12-26 NOTE — ED Notes (Signed)
 Back to CT, cleviprex continues infusing

## 2023-12-26 NOTE — ED Notes (Signed)
 Into room from CT, EDP at [facility].

## 2023-12-26 NOTE — Code Documentation (Addendum)
 Stroke Response Nurse Documentation Code Documentation  Balian Schaller is a 83 y.o. male arriving to [facility] as stroke activation via [facility]. Afib on Eliquis  (last dose this AM), pt woke up 0730 normal state and symptoms noted 0900 of speech difficulty.   Stroke team met pt at bridge upon arrival. Labs drawn and patient taken to CT. NIH 4, see flowsheet for details. CT completed, pt shortly taken back for CTA. No TNK or thrombectomy as traumatic SDH/SAH. Per report, pt fell on Saturday. Bruising noted to right oribital and significant bruising with hematoma to left groin/inner thigh. Pt given 20mg  IV hydralazine  1047, and Cleviprex gtt started 1051. Reversal agent ordered, waiting to be delivered.  Care Plan: q1h VS/NIH including pupils. NPO until swallow screen. SBP 130-150. Neurosurgery consulted by MD. Bedside handoff with ED RN Thom.    Tonna Lacks K  Rapid Response RN

## 2023-12-27 ENCOUNTER — Inpatient Hospital Stay (HOSPITAL_COMMUNITY)

## 2023-12-27 DIAGNOSIS — I4891 Unspecified atrial fibrillation: Secondary | ICD-10-CM

## 2023-12-27 DIAGNOSIS — Z7901 Long term (current) use of anticoagulants: Secondary | ICD-10-CM | POA: Diagnosis not present

## 2023-12-27 DIAGNOSIS — I509 Heart failure, unspecified: Secondary | ICD-10-CM

## 2023-12-27 DIAGNOSIS — S06350A Traumatic hemorrhage of left cerebrum without loss of consciousness, initial encounter: Secondary | ICD-10-CM | POA: Diagnosis not present

## 2023-12-27 LAB — GLUCOSE, CAPILLARY
Glucose-Capillary: 107 mg/dL — ABNORMAL HIGH (ref 70–99)
Glucose-Capillary: 102 mg/dL — ABNORMAL HIGH (ref 70–99)
Glucose-Capillary: 136 mg/dL — ABNORMAL HIGH (ref 70–99)
Glucose-Capillary: 139 mg/dL — ABNORMAL HIGH (ref 70–99)
Glucose-Capillary: 119 mg/dL — ABNORMAL HIGH (ref 70–99)
Glucose-Capillary: 101 mg/dL — ABNORMAL HIGH (ref 70–99)

## 2023-12-27 MED ORDER — LEVETIRACETAM (KEPPRA) 500 MG/5 ML ADULT IV PUSH: 1000.0000 mg | Freq: Once | INTRAVENOUS | Status: DC

## 2023-12-27 MED ORDER — LORAZEPAM 0.5 MG PO TABS
0.5000 mg | ORAL_TABLET | Freq: Two times a day (BID) | ORAL | Status: DC | PRN
  Administered 2023-12-30 – 2024-01-10 (×2): 0.5 mg via ORAL
  Filled 2023-12-27 (×2): qty 1

## 2023-12-27 MED ORDER — LEVETIRACETAM 500 MG PO TABS
500.0000 mg | ORAL_TABLET | Freq: Two times a day (BID) | ORAL | Status: DC
  Administered 2023-12-27: 500 mg via ORAL
  Filled 2023-12-27: qty 1

## 2023-12-27 MED ORDER — LEVOTHYROXINE SODIUM 75 MCG PO TABS
75.0000 ug | ORAL_TABLET | Freq: Every day | ORAL | Status: DC
  Administered 2023-12-27 – 2024-01-12 (×17): 75 ug via ORAL
  Filled 2023-12-27 (×16): qty 1

## 2023-12-27 MED ORDER — FINASTERIDE 5 MG PO TABS
5.0000 mg | ORAL_TABLET | Freq: Every day | ORAL | Status: DC
  Administered 2023-12-27 – 2024-01-12 (×17): 5 mg via ORAL
  Filled 2023-12-27 (×16): qty 1

## 2023-12-27 MED ORDER — PREDNISOLONE ACETATE 1 % OP SUSP
1.0000 [drp] | Freq: Four times a day (QID) | OPHTHALMIC | Status: DC
  Administered 2023-12-27 – 2023-12-28 (×4): 1 [drp] via OPHTHALMIC
  Filled 2023-12-27: qty 5

## 2023-12-27 MED ORDER — METOPROLOL SUCCINATE ER 25 MG PO TB24
12.5000 mg | ORAL_TABLET | Freq: Every day | ORAL | Status: DC
  Administered 2023-12-27 – 2023-12-30 (×4): 12.5 mg via ORAL
  Filled 2023-12-27 (×4): qty 1

## 2023-12-27 MED ORDER — LEVETIRACETAM 500 MG PO TABS
1000.0000 mg | ORAL_TABLET | Freq: Two times a day (BID) | ORAL | Status: DC
  Administered 2023-12-27 – 2024-01-12 (×32): 1000 mg via ORAL
  Filled 2023-12-27 (×31): qty 2

## 2023-12-27 MED ORDER — PAROXETINE HCL 10 MG PO TABS
10.0000 mg | ORAL_TABLET | Freq: Every day | ORAL | Status: DC
  Administered 2023-12-27 – 2024-01-12 (×17): 10 mg via ORAL
  Filled 2023-12-27 (×16): qty 1

## 2023-12-27 MED ORDER — HYDRALAZINE HCL 20 MG/ML IJ SOLN
5.0000 mg | INTRAMUSCULAR | Status: AC | PRN
  Administered 2023-12-27 – 2024-01-03 (×10): 5 mg via INTRAVENOUS
  Filled 2023-12-27 (×10): qty 1

## 2023-12-27 NOTE — Progress Notes (Signed)
 Patient ID: Raymond Christensen, male   DOB: Mar 06, 1940, 83 y.o.   MRN: [id number] BP (!) 158/64   Pulse 66   Temp 97.9 F (36.6 C) (Axillary)   Resp 19   SpO2 100%  Alert, oriented x 4 Expressive aphasia Moving all extremities, no clinical evidence of seizure activity.  No need for repeat Head CT

## 2023-12-27 NOTE — Plan of Care (Signed)
°  Problem: Education: Goal: Knowledge of disease or condition will improve Outcome: Progressing Goal: Knowledge of secondary prevention will improve (MUST DOCUMENT ALL) Outcome: Progressing Goal: Knowledge of patient specific risk factors will improve (DELETE if not current risk factor) Outcome: Progressing   Problem: Coping: Goal: Will verbalize positive feelings about self Outcome: Progressing   Problem: Health Behavior/Discharge Planning: Goal: Ability to manage health-related needs will improve Outcome: Progressing Goal: Goals will be collaboratively established with patient/family Outcome: Progressing

## 2023-12-27 NOTE — Progress Notes (Signed)
 LTM EEG hooked up and running - no initial skin breakdown - push button tested - [facility] monitoring.

## 2023-12-27 NOTE — Progress Notes (Signed)
 eLink Physician-Brief Progress Note Patient Name: Candido Flott DOB: [birth year] MRN: [id number]   Date of Service  12/27/2023  HPI/Events of Note    eICU Interventions  PRN iv Hydralazine  5 mg Q 4 hours PRN SBP > 150 ordered.        Carrin Vannostrand U Dilara Navarrete 12/27/2023, 12:12 AM

## 2023-12-27 NOTE — Progress Notes (Signed)
 NAME:  Raymond Christensen, MRN:  [id number], DOB:  August 02, 1940, LOS: 1 ADMISSION DATE:  12/26/2023, CONSULTATION DATE:  12/27/2023 REFERRING MD:  EDP, CHIEF COMPLAINT:  Aphasia   History of Present Illness:  83 year old male with past medical history of paroxysmal A-fib on Eliquis  last dose on day of presentation in the morning, CAD, heart failure with midrange ejection fraction 45% who presented to the hospital as a code stroke due to sudden onset expressive aphasia.  History obtained from patient, family and chart review.  Patient had a syncopal episode 6 days prior to his presentation and fell and hit the floor with bruising on his head around his right eye.  He is not able to recall what exactly happened.  On presentation to the hospital, he was noted to have significant expressive aphasia.  Code stroke and sent for head CT with findings concerning for acute left hemisphere hemorrhage with subarachnoid and intra-axial component.  Neurosurgery was consulted and they recommended repeat head CT in 6 hours.   Pertinent  Medical History  As above   Significant Hospital Events: Including procedures, antibiotic start and stop dates in addition to other pertinent events   Admitted to the ICU for ongoing medical care 12/26/23  Interim History / Subjective:   Denies headache   Objective    Blood pressure (!) 158/64, pulse 66, temperature 97.9 F (36.6 C), temperature source Axillary, resp. rate 19, SpO2 100%.        Intake/Output Summary (Last 24 hours) at 12/27/2023 1027 Last data filed at 12/27/2023 0700 Gross per 24 hour  Intake 1990.23 ml  Output 1050 ml  Net 940.23 ml   There were no vitals filed for this visit.  Examination: General:  elderly male, not in distress , on room air HENT:  PERRLA, EOMI  Lungs: No accessory muscle use, clear breath sounds Cardiovascular: S1-S2 regular Abdomen: soft, NT Extremities: groin hematoma, warm, well perfused  Neuro: Power is 5/5  bilateral, mild right facial weakness, some expressive aphasia/word salad especially difficulty naming , after our conversation per RN he was aphasic for a few seconds with facial quivering raising question of seizure activity  Repeat head CT shows subdural hematoma, some redistribution of SAH and temporal contusion as prior   Resolved problem list   Assessment and Plan   #Traumatic left temporal ICH #Traumatic SAH /small SDH  -Discussed with neurology, obtain stat EEG to rule out seizure, he is high risk, already on Keppra  -- Q1h neurochecks  - BP goal of <160  -  SCD for DVT ppx, no chemical DVT ppx - Hold off nimodipine  and TCDs (no evidence of aneurysmal bleed) - PT/OT, SLP - Neurosurgery following and defer imaging recommendations to them   #Afib on eliquis   (eliquis  already reversed) #HF with mid range EF   -Resume low-dose metoprolol   Other home meds including Synthroid , bupropion  and Ativan  will be renewed  Labs   CBC: Recent Labs  Lab 12/26/23 1035 12/26/23 1039  WBC 8.6  --   NEUTROABS 5.9  --   HGB 12.4* 12.6*  HCT 38.4* 37.0*  MCV 89.3  --   PLT 170  --     Basic Metabolic Panel: Recent Labs  Lab 12/22/23 1352 12/26/23 1035 12/26/23 1039  NA 141 135 140  K 4.1 4.8 4.5  CL 106 101 102  CO2 29 26  --   GLUCOSE 74 113* 116*  BUN 25* 13 15  CREATININE 0.91 1.00 1.10  CALCIUM  9.0 8.6*  --  GFR: Estimated Creatinine Clearance: 56.5 mL/min (by C-G formula based on SCr of 1.1 mg/dL). Recent Labs  Lab 12/26/23 1035  WBC 8.6    Liver Function Tests: Recent Labs  Lab 12/22/23 1352 12/26/23 1035  AST 19 44*  ALT 21 21  ALKPHOS 60 54  BILITOT 0.7 1.5*  PROT 6.3 6.2*  ALBUMIN  4.2 3.7   No results for input(s): LIPASE, AMYLASE in the last 168 hours. No results for input(s): AMMONIA in the last 168 hours.  ABG    Component Value Date/Time   PHART 7.437 12/21/2019 1402   PCO2ART 36.9 12/21/2019 1402   PO2ART 90.7 12/21/2019 1402    HCO3 24.4 12/21/2019 1402   TCO2 27 12/26/2023 1039   O2SAT 97.2 12/21/2019 1402     Coagulation Profile: Recent Labs  Lab 12/26/23 1035 12/26/23 1052  INR 1.2 1.3*    Cardiac Enzymes: No results for input(s): CKTOTAL, CKMB, CKMBINDEX, TROPONINI in the last 168 hours.  HbA1C: Hgb A1c MFr Bld  Date/Time Value Ref Range Status  12/22/2023 01:52 PM 5.5 4.6 - 6.5 % Final    Comment:    Glycemic Control Guidelines for People with Diabetes:Non Diabetic:  <6%Goal of Therapy: <7%Additional Action Suggested:  >8%   06/24/2023 11:18 AM 5.6 4.6 - 6.5 % Final    Comment:    Glycemic Control Guidelines for People with Diabetes:Non Diabetic:  <6%Goal of Therapy: <7%Additional Action Suggested:  >8%     CBG: Recent Labs  Lab 12/26/23 1755 12/26/23 1926 12/26/23 2318 12/27/23 0337 12/27/23 0713  GLUCAP 105* 90 103* 107* 102*     Devynn Hessler V. Jude, MD [facility] 12/27/2023 10:27 AM

## 2023-12-27 NOTE — Evaluation (Signed)
 Occupational Therapy Evaluation Patient Details Name: Raymond Christensen MRN: [id number] DOB: 07-Sep-1940 Today's Date: 12/27/2023   History of Present Illness   83 year old male presented to the hospital as a code stroke 12/12 due to sudden onset expressive aphasia. Found to have acute left hemisphere hemorrhage with subarachnoid and intra-axial component. Did have a fall 6 days PTA. PHMx: paroxysmal A-fib,  CAD, heart failure with midrange ejection fraction 45%, HTN, DM, anxiety, NSTEMI, memory change, OA, skin CA left arm, vaso vagal responses to GI issues.     Clinical Impressions This 83 yo male admitted with above presents to acute OT with PLOF of being totally independent with all basic ADLs and driving. He currently is at a CGA-min A when up on his feet no AD for ambulation thus affecting his safety and independence with basic ADLs. He will benefit from acute OT without need for follow up.     If plan is discharge home, recommend the following:   A little help with walking and/or transfers;A little help with bathing/dressing/bathroom;Assist for transportation;Help with stairs or ramp for entrance     Functional Status Assessment   Patient has had a recent decline in their functional status and demonstrates the ability to make significant improvements in function in a reasonable and predictable amount of time.     Equipment Recommendations   None recommended by OT      Precautions/Restrictions   Precautions Precautions: Fall Restrictions Weight Bearing Restrictions Per Provider Order: No     Mobility Bed Mobility Overal bed mobility: Needs Assistance Bed Mobility: Supine to Sit, Sit to Supine     Supine to sit: Supervision, HOB elevated Sit to supine: Supervision        Transfers Overall transfer level: Needs assistance Equipment used: 1 person hand held assist Transfers: Sit to/from Stand Sit to Stand: Contact guard assist                   Balance Overall balance assessment: Needs assistance Sitting-balance support: No upper extremity supported, Feet supported Sitting balance-Leahy Scale: Fair     Standing balance support: Single extremity supported Standing balance-Leahy Scale: Poor                             ADL either performed or assessed with clinical judgement   ADL Overall ADL's : Needs assistance/impaired Eating/Feeding: Independent;Sitting   Grooming: Set up;Sitting   Upper Body Bathing: Set up;Sitting   Lower Body Bathing: Contact guard assist;Sit to/from stand Lower Body Bathing Details (indicate cue type and reason): has long handled sponge at home Upper Body Dressing : Set up;Sitting   Lower Body Dressing: Contact guard assist;Sit to/from stand Lower Body Dressing Details (indicate cue type and reason): uses sock aid at home Toilet Transfer: Contact guard assist;Ambulation Toilet Transfer Details (indicate cue type and reason): No AD, simulated bed>chair 8 feet in front of him then backing up to bed (had EEG leads hooked up) Toileting- Clothing Manipulation and Hygiene: Contact guard assist;Sit to/from stand               Vision Baseline Vision/History: 1 Wears glasses Ability to See in Adequate Light: 0 Adequate Patient Visual Report: No change from baseline Vision Assessment?: Yes Eye Alignment: Within Functional Limits Ocular Range of Motion: Within Functional Limits Alignment/Gaze Preference: Within Defined Limits Tracking/Visual Pursuits: Able to track stimulus in all quads without difficulty Convergence: Within functional limits Visual Fields: No apparent deficits Additional  Comments: cataract removed this past week            Pertinent Vitals/Pain Pain Assessment Pain Assessment: No/denies pain     Extremity/Trunk Assessment Upper Extremity Assessment Upper Extremity Assessment: Right hand dominant;Overall WFL for tasks assessed           Communication  Communication Communication: No apparent difficulties   Cognition Arousal: Alert Behavior During Therapy: WFL for tasks assessed/performed Cognition: No apparent impairments                               Following commands: Intact       Cueing    Cueing Techniques: Verbal cues              Home Living Family/patient expects to be discharged to:: (P) Private residence Living Arrangements: (P) Spouse/significant other Available Help at Discharge: (P) Available 24 hours/day Type of Home: (P) Independent living facility Home Access: (P) Level entry     Home Layout: (P) One level (elevators)     Bathroom Shower/Tub: (P) Walk-in shower   Bathroom Toilet: (P) Handicapped height     Home Equipment: (P) Grab bars - tub/shower;Grab bars - toilet (Simultaneous filing. User may not have seen previous data.) Adaptive Equipment: Sock aid Additional Comments: (P) dining room for dinner      Prior Functioning/Environment Prior Level of Function : (P) Independent/Modified Independent             Mobility Comments: (P) Lives [facility]      OT Problem List: Impaired balance (sitting and/or standing)   OT Treatment/Interventions: Self-care/ADL training;DME and/or AE instruction;Balance training;Patient/family education      OT Goals(Current goals can be found in the care plan section)   Acute Rehab OT Goals Patient Stated Goal: for speech to continue to get better and be able to get up and move around more OT Goal Formulation: With patient Time For Goal Achievement: 01/10/24 Potential to Achieve Goals: Good   OT Frequency:  Min 2X/week    Co-evaluation PT/OT/SLP Co-Evaluation/Treatment: Yes Reason for Co-Treatment: (P) To address functional/ADL transfers;For patient/therapist safety PT goals addressed during session: (P) Mobility/safety with mobility        AM-PAC OT 6 Clicks Daily Activity     Outcome Measure Help from  another person eating meals?: None Help from another person taking care of personal grooming?: A Little Help from another person toileting, which includes using toliet, bedpan, or urinal?: A Little Help from another person bathing (including washing, rinsing, drying)?: A Little Help from another person to put on and taking off regular upper body clothing?: A Little Help from another person to put on and taking off regular lower body clothing?: A Little 6 Click Score: 19   End of Session Nurse Communication: Mobility status (EEG line came undone from box--I plugged it back in but needs to be checked)  Activity Tolerance: Patient tolerated treatment well Patient left: in bed;with call bell/phone within reach;with bed alarm set (wife present towards end of session)  OT Visit Diagnosis: Unsteadiness on feet (R26.81);Other abnormalities of gait and mobility (R26.89)                Time: 8762-8694 OT Time Calculation (min): 28 min Charges:  OT General Charges $OT Visit: 1 Visit OT Evaluation $OT Eval Moderate Complexity: 1 Mod Cathy L. OT Acute Rehabilitation Services Office [phone]    Loys, Shugars 12/27/2023, 1:56 PM

## 2023-12-27 NOTE — Plan of Care (Signed)
 Called for possible facial twitching which has resolved. He is on Keppra . Will hook up to cEEG and monitor for 24h. Exam still has some word salad. Mild right hemiparesis but no drift. No gaze issues. Mild right facial weakness. Will follow Plan d/w Dr. Jude   A. Voncile MD Neurology

## 2023-12-27 NOTE — Evaluation (Signed)
 Physical Therapy Evaluation Patient Details Name: Raymond Christensen MRN: [id number] DOB: 1940-10-09 Today's Date: 12/27/2023  History of Present Illness  83 year old male presented to the hospital as a code stroke 12/12 due to sudden onset expressive aphasia. Found to have acute left hemisphere hemorrhage with subarachnoid and intra-axial component. Did have a fall 6 days PTA. PHMx: paroxysmal A-fib,  CAD, heart failure with midrange ejection fraction 45%, HTN, DM, anxiety, NSTEMI, memory change, OA, skin CA left arm, vaso vagal responses to GI issues.  Clinical Impression  Pt admitted with/for sudden onset of expressive aphasia due to L hemisphere hemorrhage with little focal weakness associated, but with some general weakness and balance/equalibrium issues which may be a combination insult vs 2 days of relative immobility.  Pt currently limited functionally due to the problems listed below.  (see problems list.)  Pt will benefit from PT to maximize function and safety to be able to get home safely with available assist.  He could benefit from a short stent of OPPT-neuro for some reconditioning and fine tuning         If plan is discharge home, recommend the following: A little help with walking and/or transfers;A little help with bathing/dressing/bathroom;Assistance with cooking/housework;Assist for transportation (PRN overall)   Can travel by private vehicle        Equipment Recommendations None recommended by PT (TBA)  Recommendations for Other Services       Functional Status Assessment Patient has had a recent decline in their functional status and demonstrates the ability to make significant improvements in function in a reasonable and predictable amount of time.     Precautions / Restrictions Precautions Precautions: Fall Recall of Precautions/Restrictions: Intact      Mobility  Bed Mobility Overal bed mobility: Needs Assistance Bed Mobility: Supine to Sit, Sit to Supine      Supine to sit: Supervision, HOB elevated Sit to supine: Supervision        Transfers Overall transfer level: Needs assistance Equipment used: 1 person hand held assist Transfers: Sit to/from Stand Sit to Stand: Contact guard assist                Ambulation/Gait Ambulation/Gait assistance: Contact guard assist Gait Distance (Feet): 5 Feet (forward /back limited by EEG leads) Assistive device: None Gait Pattern/deviations: Step-through pattern   Gait velocity interpretation: <1.31 ft/sec, indicative of household Public Librarian Bed    Modified Rankin (Stroke Patients Only) Modified Rankin (Stroke Patients Only) Pre-Morbid Rankin Score: No symptoms Modified Rankin: Moderate disability     Balance Overall balance assessment: Needs assistance Sitting-balance support: No upper extremity supported, Feet supported Sitting balance-Leahy Scale: Fair     Standing balance support: Single extremity supported Standing balance-Leahy Scale: Poor Standing balance comment: BERG activities -standing, looking over shoulder, turning 360 degrees caused enough disruption to need min assist to recover.                             Pertinent Vitals/Pain Pain Assessment Pain Assessment: No/denies pain    Home Living Family/patient expects to be discharged to:: Private residence Living Arrangements: Spouse/significant other Available Help at Discharge: Available 24 hours/day Type of Home: Independent living facility Home Access: Level entry       Home Layout: One level (elevators) Home Equipment: Grab bars - tub/shower;Grab bars -  toilet Additional Comments: dining room for dinner    Prior Function Prior Level of Function : Independent/Modified Independent             Mobility Comments: Lives [facility]       Extremity/Trunk Assessment   Upper Extremity Assessment Upper  Extremity Assessment: Defer to OT evaluation    Lower Extremity Assessment Lower Extremity Assessment: Overall WFL for tasks assessed (bil proximal weakness)    Cervical / Trunk Assessment Cervical / Trunk Assessment: Normal  Communication   Communication Communication: No apparent difficulties    Cognition Arousal: Alert Behavior During Therapy: WFL for tasks assessed/performed                             Following commands: Intact       Cueing Cueing Techniques: Verbal cues     General Comments General comments (skin integrity, edema, etc.): vss    Exercises     Assessment/Plan    PT Assessment Patient needs continued PT services  PT Problem List Decreased strength;Decreased activity tolerance;Decreased balance;Decreased mobility;Decreased coordination       PT Treatment Interventions Gait training;Stair training;Functional mobility training;Therapeutic activities;Balance training;Neuromuscular re-education;Patient/family education    PT Goals (Current goals can be found in the Care Plan section)  Acute Rehab PT Goals Patient Stated Goal: back to independent level or PLOF PT Goal Formulation: With patient Time For Goal Achievement: 01/10/24 Potential to Achieve Goals: Good    Frequency Min 3X/week     Co-evaluation PT/OT/SLP Co-Evaluation/Treatment: Yes Reason for Co-Treatment: To address functional/ADL transfers;For patient/therapist safety PT goals addressed during session: Mobility/safety with mobility         AM-PAC PT 6 Clicks Mobility  Outcome Measure Help needed turning from your back to your side while in a flat bed without using bedrails?: A Little Help needed moving from lying on your back to sitting on the side of a flat bed without using bedrails?: A Little Help needed moving to and from a bed to a chair (including a wheelchair)?: A Little Help needed standing up from a chair using your arms (e.g., wheelchair or bedside  chair)?: A Little Help needed to walk in hospital room?: A Little Help needed climbing 3-5 steps with a railing? : A Little 6 Click Score: 18    End of Session   Activity Tolerance: Patient tolerated treatment well Patient left: in bed;with call bell/phone within reach Nurse Communication: Mobility status PT Visit Diagnosis: Unsteadiness on feet (R26.81);Other abnormalities of gait and mobility (R26.89);Other symptoms and signs involving the nervous system (R29.898)    Time: 8762-8694 PT Time Calculation (min) (ACUTE ONLY): 28 min   Charges:   PT Evaluation $PT Eval Moderate Complexity: 1 Mod   PT General Charges $$ ACUTE PT VISIT: 1 Visit         12/27/2023  India HERO., PT Acute Rehabilitation Services [phone]  (office)  Vinie GAILS Shaylah Mcghie 12/27/2023, 4:21 PM

## 2023-12-27 NOTE — Progress Notes (Signed)
 NEUROLOGY CONSULT FOLLOW UP NOTE   Date of service: December 27, 2023 Patient Name: Raymond Christensen MRN:  [id number] DOB:  [birth year]  Interval Hx/subjective   Recall for possible seizure Facial twitching noted  Vitals   Vitals:   12/27/23 1100 12/27/23 1200 12/27/23 1300 12/27/23 1400  BP: (!) 149/67 (!) 160/83 (!) 166/75 (!) 162/69  Pulse: 63 66 66 71  Resp: 16 16 19 19   Temp:  100 F (37.8 C)    TempSrc:  Axillary    SpO2: 99% 100% 100% 100%     There is no height or weight on file to calculate BMI.  Physical Exam   General: Awake alert in no distress HEENT: Normocephalic, bruising as before Neurological exam Awake alert oriented to self Word salad Cranial nerves II to XII with very subtle right nasolabial fold flattening, otherwise intact Motor exam shows mild and subtle right hemiparesis with no drift Sensation intact to light touch   Medications Current Medications[1]  Labs and Diagnostic Imaging   CBC:  Recent Labs  Lab 12/26/23 1035 12/26/23 1039  WBC 8.6  --   NEUTROABS 5.9  --   HGB 12.4* 12.6*  HCT 38.4* 37.0*  MCV 89.3  --   PLT 170  --     Basic Metabolic Panel:  Lab Results  Component Value Date   NA 140 12/26/2023   K 4.5 12/26/2023   CO2 26 12/26/2023   GLUCOSE 116 (H) 12/26/2023   BUN 15 12/26/2023   CREATININE 1.10 12/26/2023   CALCIUM  8.6 (L) 12/26/2023   GFRNONAA >60 12/26/2023   GFRAA 49 (L) 01/13/2020   Lipid Panel:  Lab Results  Component Value Date   LDLCALC 48 12/22/2023   HgbA1c:  Lab Results  Component Value Date   HGBA1C 5.5 12/22/2023   Urine Drug Screen:     Component Value Date/Time   LABOPIA NONE DETECTED 12/26/2023 1221   COCAINSCRNUR NONE DETECTED 12/26/2023 1221   LABBENZ NONE DETECTED 12/26/2023 1221   AMPHETMU NONE DETECTED 12/26/2023 1221   THCU NONE DETECTED 12/26/2023 1221   LABBARB NONE DETECTED 12/26/2023 1221    Alcohol Level     Component Value Date/Time   ETH <15 12/26/2023 1035    INR  Lab Results  Component Value Date   INR 1.3 (H) 12/26/2023   APTT  Lab Results  Component Value Date   APTT 39 (H) 12/26/2023   CT head and 2 repeat head CT's: Unchanged predominantly left-sided subarachnoid hemorrhage, subdural hemorrhage and suspected left temporal intracranial hemorrhage and edema.  No midline shift or hydrocephalus.  Assessment   Raymond Christensen is a 83 y.o. male on Eliquis  with fall last Saturday, presented as a code stroke with sudden onset speech difficulty.  He had word salad and aphasia on exam. CT head demonstrated left hemispheric subarachnoid hemorrhage with mild subdural hemorrhage and temporal lobe ICH/contusion-that has been stable on to repeat head CT's.  Was on Eliquis -was reversed with Andexxa . Had facial twitching with concern for seizure. Long-term EEG hooked up Long-term EEG showed 135-second seizure around 1:15 PM today. On Keppra -will need increased dose  Impression: New onset seizure in the setting of traumatic ICH, SDH, SAH, hypertensive emergency, coagulopathy due to DOAC use at home which was reversed on arrival  Recommendations  Give additional Keppra  1 g IV and increase Keppra  to 1 g twice daily. Continue LTM Monitor for any seizure activity Bleed management per neurosurgery Plan discussed with Dr. Jude  ______________________________________________________________________   Signed,  Eligio Lav, MD [facility]   CRITICAL CARE ATTESTATION Performed by: Eligio Lav, MD Total critical care time: 40 minutes Critical care time was exclusive of separately billable procedures and treating other patients and/or supervising APPs/Residents/Students Critical care was necessary to treat or prevent imminent or life-threatening deterioration. This patient is critically ill and at significant risk for neurological worsening and/or death and care requires constant monitoring. Critical care was time spent personally by me  on the following activities: development of treatment plan with patient and/or surrogate as well as nursing, discussions with consultants, evaluation of patient's response to treatment, examination of patient, obtaining history from patient or surrogate, ordering and performing treatments and interventions, ordering and review of laboratory studies, ordering and review of radiographic studies, pulse oximetry, re-evaluation of patient's condition, participation in multidisciplinary rounds and medical decision making of high complexity in the care of this patient.     [1]  Current Facility-Administered Medications:    acetaminophen  (TYLENOL ) tablet 650 mg, 650 mg, Oral, Q4H PRN, 650 mg at 12/27/23 0952 **OR** acetaminophen  (TYLENOL ) 160 MG/5ML solution 650 mg, 650 mg, Per Tube, Q4H PRN **OR** acetaminophen  (TYLENOL ) suppository 650 mg, 650 mg, Rectal, Q4H PRN, Hattar, Zola SAILOR, MD   Chlorhexidine  Gluconate Cloth 2 % PADS 6 each, 6 each, Topical, Q0600, Hattar, Zola SAILOR, MD, 6 each at 12/27/23 0408   clevidipine  (CLEVIPREX ) infusion 0.5 mg/mL, 0-21 mg/hr, Intravenous, Continuous, Hattar, Zola SAILOR, MD, Stopped at 12/26/23 1106   docusate sodium  (COLACE) capsule 100 mg, 100 mg, Oral, BID, Hattar, Zola SAILOR, MD, 100 mg at 12/27/23 9048   finasteride  (PROSCAR ) tablet 5 mg, 5 mg, Oral, Daily, Alva, Rakesh V, MD, 5 mg at 12/27/23 1223   hydrALAZINE  (APRESOLINE ) injection 5 mg, 5 mg, Intravenous, Q4H PRN, Ogan, Okoronkwo U, MD, 5 mg at 12/27/23 0019   levETIRAcetam  (KEPPRA ) tablet 500 mg, 500 mg, Oral, BID, Gerard Bonus, MD, 500 mg at 12/27/23 1318   levothyroxine  (SYNTHROID ) tablet 75 mcg, 75 mcg, Oral, Daily, Alva, Rakesh V, MD, 75 mcg at 12/27/23 1223   LORazepam  (ATIVAN ) tablet 0.5 mg, 0.5 mg, Oral, BID PRN, Alva, Rakesh V, MD   metoprolol  succinate (TOPROL -XL) 24 hr tablet 12.5 mg, 12.5 mg, Oral, Daily, Alva, Rakesh V, MD, 12.5 mg at 12/27/23 1223   ondansetron  (ZOFRAN -ODT) disintegrating tablet 4 mg, 4 mg,  Oral, Q6H PRN **OR** ondansetron  (ZOFRAN ) injection 4 mg, 4 mg, Intravenous, Q6H PRN, Hattar, Zola SAILOR, MD, 4 mg at 12/26/23 1320   pantoprazole  (PROTONIX ) EC tablet 40 mg, 40 mg, Oral, Daily, 40 mg at 12/27/23 0951 **OR** pantoprazole  (PROTONIX ) injection 40 mg, 40 mg, Intravenous, Daily, Hattar, Laith N, MD, 40 mg at 12/26/23 1320   PARoxetine  (PAXIL ) tablet 10 mg, 10 mg, Oral, Daily, Alva, Rakesh V, MD, 10 mg at 12/27/23 1224   prednisoLONE  acetate (PRED FORTE ) 1 % ophthalmic suspension 1 drop, 1 drop, Left Eye, QID, Jude Harden GAILS, MD, 1 drop at 12/27/23 1318

## 2023-12-28 ENCOUNTER — Inpatient Hospital Stay (HOSPITAL_COMMUNITY)

## 2023-12-28 DIAGNOSIS — R4701 Aphasia: Secondary | ICD-10-CM

## 2023-12-28 DIAGNOSIS — R569 Unspecified convulsions: Secondary | ICD-10-CM

## 2023-12-28 DIAGNOSIS — S06350A Traumatic hemorrhage of left cerebrum without loss of consciousness, initial encounter: Secondary | ICD-10-CM | POA: Diagnosis not present

## 2023-12-28 DIAGNOSIS — I509 Heart failure, unspecified: Secondary | ICD-10-CM | POA: Diagnosis not present

## 2023-12-28 DIAGNOSIS — I4891 Unspecified atrial fibrillation: Secondary | ICD-10-CM | POA: Diagnosis not present

## 2023-12-28 LAB — GLUCOSE, CAPILLARY
Glucose-Capillary: 112 mg/dL — ABNORMAL HIGH (ref 70–99)
Glucose-Capillary: 108 mg/dL — ABNORMAL HIGH (ref 70–99)
Glucose-Capillary: 112 mg/dL — ABNORMAL HIGH (ref 70–99)
Glucose-Capillary: 108 mg/dL — ABNORMAL HIGH (ref 70–99)
Glucose-Capillary: 122 mg/dL — ABNORMAL HIGH (ref 70–99)
Glucose-Capillary: 121 mg/dL — ABNORMAL HIGH (ref 70–99)

## 2023-12-28 MED ORDER — LIOTHYRONINE SODIUM 5 MCG PO TABS
10.0000 ug | ORAL_TABLET | Freq: Every day | ORAL | Status: DC
  Administered 2023-12-29 – 2024-01-12 (×15): 10 ug via ORAL
  Filled 2023-12-28 (×15): qty 2

## 2023-12-28 MED ORDER — PREDNISOLONE ACET-MOXIFLOXACIN 1-0.5 % OP SUSP
Freq: Four times a day (QID) | OPHTHALMIC | Status: DC
  Administered 2023-12-29 – 2024-01-01 (×3): 1 [drp] via OPHTHALMIC

## 2023-12-28 NOTE — Progress Notes (Signed)
 LTM EEG disconnected - no skin breakdown at [facility]. [facility] notified.

## 2023-12-28 NOTE — Progress Notes (Addendum)
 NEUROLOGY CONSULT FOLLOW UP NOTE   Date of service: December 28, 2023 Patient Name: Raymond Christensen MRN:  [id number] DOB:  April 19, 1940  Interval Hx/subjective  Seen and examined No clinical seizures overnight.  Overnight EEG read pending  Vitals   Vitals:   12/28/23 0600 12/28/23 0608 12/28/23 0700 12/28/23 0800  BP: (!) 172/93 (!) 163/80 136/68   Pulse: 76  72   Resp: (!) 21  18   Temp:    98.9 F (37.2 C)  TempSrc:    Oral  SpO2: 94%  100%      There is no height or weight on file to calculate BMI.  Physical Exam   General: Awake alert in no distress HEENT: Normocephalic, bruising as before Neurological exam Awake alert oriented to self Speech much better with mildly diminished fluency with longer sentences. Cranial nerves II to XII essentially normal-the subtle right nasolabial fold is not as prominent today. Motor exam shows mild and subtle right hemiparesis with no drift, improved from yesterday. Sensation intact to light touch   Medications Current Medications[1]  Labs and Diagnostic Imaging   CBC:  Recent Labs  Lab 12/26/23 1035 12/26/23 1039  WBC 8.6  --   NEUTROABS 5.9  --   HGB 12.4* 12.6*  HCT 38.4* 37.0*  MCV 89.3  --   PLT 170  --     Basic Metabolic Panel:  Lab Results  Component Value Date   NA 140 12/26/2023   K 4.5 12/26/2023   CO2 26 12/26/2023   GLUCOSE 116 (H) 12/26/2023   BUN 15 12/26/2023   CREATININE 1.10 12/26/2023   CALCIUM  8.6 (L) 12/26/2023   GFRNONAA >60 12/26/2023   GFRAA 49 (L) 01/13/2020   Lipid Panel:  Lab Results  Component Value Date   LDLCALC 48 12/22/2023   HgbA1c:  Lab Results  Component Value Date   HGBA1C 5.5 12/22/2023   Urine Drug Screen:     Component Value Date/Time   LABOPIA NONE DETECTED 12/26/2023 1221   COCAINSCRNUR NONE DETECTED 12/26/2023 1221   LABBENZ NONE DETECTED 12/26/2023 1221   AMPHETMU NONE DETECTED 12/26/2023 1221   THCU NONE DETECTED 12/26/2023 1221   LABBARB NONE DETECTED  12/26/2023 1221    Alcohol Level     Component Value Date/Time   ETH <15 12/26/2023 1035   INR  Lab Results  Component Value Date   INR 1.3 (H) 12/26/2023   APTT  Lab Results  Component Value Date   APTT 39 (H) 12/26/2023   CT head and 2 repeat head CTs: Unchanged predominantly left-sided subarachnoid hemorrhage, subdural hemorrhage and suspected left temporal intracranial hemorrhage and edema.  No midline shift or hydrocephalus.  Assessment   Raymond Christensen is a 83 y.o. male on Eliquis  with fall last Saturday, presented as a code stroke with sudden onset speech difficulty.  He had word salad and aphasia on exam. CT head demonstrated left hemispheric subarachnoid hemorrhage with mild subdural hemorrhage and temporal lobe ICH/contusion-that has been stable on to repeat head CT's.  Was on Eliquis -was reversed with Andexxa . Had facial twitching with concern for seizure. Long-term EEG hooked up Long-term EEG showed one 35-second seizure around 1:15 PM today. On Keppra -will need increased dose  Impression: New onset seizure in the setting of traumatic ICH, SDH, SAH, hypertensive emergency, coagulopathy due to DOAC use at home which was reversed on arrival  Recommendations  Continue Keppra  1 g twice daily If overnight read has no seizures after the Keppra  increased, LTM can  be discontinued Monitor for any seizure activity clinically. Bleed management and follow-up per neurosurgery Follow-up with outpatient neurology in 8 to 12 weeks Plan discussed with Dr. Jude  ______________________________________________________________________  Addendum LTM EEG shows no evidence of further seizures other than the 35-second seizure noted yesterday. I will discontinue LTM EEG I would continue Keppra  1 g twice daily and seizure precautions Outpatient follow-up with neurology and neurosurgery as above. Inpatient neurology will be available as needed.  Please do not hesitate to call with  questions. Signed, Eligio Lav, MD [facility]     [1]  Current Facility-Administered Medications:    acetaminophen  (TYLENOL ) tablet 650 mg, 650 mg, Oral, Q4H PRN, 650 mg at 12/28/23 0507 **OR** acetaminophen  (TYLENOL ) 160 MG/5ML solution 650 mg, 650 mg, Per Tube, Q4H PRN **OR** acetaminophen  (TYLENOL ) suppository 650 mg, 650 mg, Rectal, Q4H PRN, Hattar, Zola SAILOR, MD   Chlorhexidine  Gluconate Cloth 2 % PADS 6 each, 6 each, Topical, Q0600, Hattar, Zola SAILOR, MD, 6 each at 12/27/23 0408   clevidipine  (CLEVIPREX ) infusion 0.5 mg/mL, 0-21 mg/hr, Intravenous, Continuous, Hattar, Zola SAILOR, MD, Stopped at 12/26/23 1106   docusate sodium  (COLACE) capsule 100 mg, 100 mg, Oral, BID, Hattar, Zola SAILOR, MD, 100 mg at 12/27/23 2111   finasteride  (PROSCAR ) tablet 5 mg, 5 mg, Oral, Daily, Alva, Rakesh V, MD, 5 mg at 12/27/23 1223   hydrALAZINE  (APRESOLINE ) injection 5 mg, 5 mg, Intravenous, Q4H PRN, Ogan, Okoronkwo U, MD, 5 mg at 12/28/23 0608   levETIRAcetam  (KEPPRA ) tablet 1,000 mg, 1,000 mg, Oral, BID, Rashaad Hallstrom, MD, 1,000 mg at 12/27/23 2111   levETIRAcetam  (KEPPRA ) undiluted injection 1,000 mg, 1,000 mg, Intravenous, Once, Lav Eligio, MD   levothyroxine  (SYNTHROID ) tablet 75 mcg, 75 mcg, Oral, Daily, Alva, Rakesh V, MD, 75 mcg at 12/28/23 9495   LORazepam  (ATIVAN ) tablet 0.5 mg, 0.5 mg, Oral, BID PRN, Alva, Rakesh V, MD   metoprolol  succinate (TOPROL -XL) 24 hr tablet 12.5 mg, 12.5 mg, Oral, Daily, Alva, Rakesh V, MD, 12.5 mg at 12/27/23 1223   ondansetron  (ZOFRAN -ODT) disintegrating tablet 4 mg, 4 mg, Oral, Q6H PRN **OR** ondansetron  (ZOFRAN ) injection 4 mg, 4 mg, Intravenous, Q6H PRN, Hattar, Zola SAILOR, MD, 4 mg at 12/26/23 1320   pantoprazole  (PROTONIX ) EC tablet 40 mg, 40 mg, Oral, Daily, 40 mg at 12/27/23 0951 **OR** pantoprazole  (PROTONIX ) injection 40 mg, 40 mg, Intravenous, Daily, Hattar, Laith N, MD, 40 mg at 12/26/23 1320   PARoxetine  (PAXIL ) tablet 10 mg, 10 mg, Oral, Daily, Alva,  Rakesh V, MD, 10 mg at 12/27/23 1224   prednisoLONE  acetate (PRED FORTE ) 1 % ophthalmic suspension 1 drop, 1 drop, Left Eye, QID, Jude Harden GAILS, MD, 1 drop at 12/27/23 2111

## 2023-12-28 NOTE — TOC CAGE-AID Note (Signed)
 Transition of Care [facility]) - CAGE-AID Screening  Patient Details  Name: Raymond Christensen MRN: [id number] Date of Birth: 06/24/1940  Clinical Narrative:  Patient endorses occasional alcohol use, approximately 1/week. Patient denies any drug use or need for substance abuse resources.  CAGE-AID Screening:   Have You Ever Felt You Ought to Cut Down on Your Drinking or Drug Use?: No Have People Annoyed You By Critizing Your Drinking Or Drug Use?: No Have You Felt Bad Or Guilty About Your Drinking Or Drug Use?: No Have You Ever Had a Drink or Used Drugs First Thing In The Morning to Steady Your Nerves or to Get Rid of a Hangover?: No CAGE-AID Score: 0  Substance Abuse Education Offered: Yes

## 2023-12-28 NOTE — Progress Notes (Signed)
 Patient ID: Raymond Christensen, male   DOB: 10-02-1940, 83 y.o.   MRN: [id number] BP (!) 152/70   Pulse 71   Temp 98.9 F (37.2 C) (Oral)   Resp 17   SpO2 100%  Alert and oriented Expressive and receptive aphasia are improved Neurologically improving Continue to believe improvement will correlate with time

## 2023-12-28 NOTE — Progress Notes (Signed)
 NAME:  Raymond Christensen, MRN:  [id number], DOB:  17-Jul-1940, LOS: 2 ADMISSION DATE:  12/26/2023, CONSULTATION DATE:  12/28/2023 REFERRING MD:  EDP, CHIEF COMPLAINT:  Aphasia   History of Present Illness:  83 year old male with past medical history of paroxysmal A-fib on Eliquis  last dose on day of presentation in the morning, CAD, heart failure with midrange ejection fraction 45% who presented to the hospital as a code stroke due to sudden onset expressive aphasia.  History obtained from patient, family and chart review.  Patient had a syncopal episode 6 days prior to his presentation and fell and hit the floor with bruising on his head around his right eye.  He is not able to recall what exactly happened.  On presentation to the hospital, he was noted to have significant expressive aphasia.  Code stroke and sent for head CT with findings concerning for acute left hemisphere hemorrhage with subarachnoid and intra-axial component.  Neurosurgery was consulted and they recommended repeat head CT in 6 hours.   Pertinent  Medical History  As above   Significant Hospital Events: Including procedures, antibiotic start and stop dates in addition to other pertinent events   Admitted to the ICU  12/26/23 12/13 LTM EEG added due to suspicion  Interim History / Subjective:   No headache, feels improved   Objective    Blood pressure (!) 154/69, pulse 76, temperature 98.9 F (37.2 C), temperature source Oral, resp. rate 18, SpO2 100%.        Intake/Output Summary (Last 24 hours) at 12/28/2023 1058 Last data filed at 12/28/2023 0400 Gross per 24 hour  Intake --  Output 2325 ml  Net -2325 ml   There were no vitals filed for this visit.  Examination: Elderly male lying in bed, no distress Clear breath sounds bilateral, no accessory muscle use Alert, interactive power is 5/5 bilateral, aphasia is much improved-he is able to speak longer sentences without stopping for words  Repeat head  CT 12/13 shows unchanged subdural hematoma, some redistribution of SAH and temporal contusion as prior   Resolved problem list   Assessment and Plan   #Traumatic left temporal ICH #Traumatic SAH /small SDH LTM EEG shows 1 episode of seizure on 12/13 at 1314 --Continue Keppra  and LTM EEG for another 24 hours, will likely need AED for longer - BP goal of <160  -  SCD for DVT ppx, no chemical DVT ppx - No need for nimodipine  -Diet advance, okay to mobilize - Neurosurgery and neurology following, overall appears improved   #Afib on eliquis   (eliquis  already reversed) #HF with mid range EF   -Resume low-dose metoprolol   Other home meds including Synthroid , bupropion  and Ativan  have been renewed  Labs   CBC: Recent Labs  Lab 12/26/23 1035 12/26/23 1039  WBC 8.6  --   NEUTROABS 5.9  --   HGB 12.4* 12.6*  HCT 38.4* 37.0*  MCV 89.3  --   PLT 170  --     Basic Metabolic Panel: Recent Labs  Lab 12/22/23 1352 12/26/23 1035 12/26/23 1039  NA 141 135 140  K 4.1 4.8 4.5  CL 106 101 102  CO2 29 26  --   GLUCOSE 74 113* 116*  BUN 25* 13 15  CREATININE 0.91 1.00 1.10  CALCIUM  9.0 8.6*  --    GFR: Estimated Creatinine Clearance: 56.5 mL/min (by C-G formula based on SCr of 1.1 mg/dL). Recent Labs  Lab 12/26/23 1035  WBC 8.6    Liver Function  Tests: Recent Labs  Lab 12/22/23 1352 12/26/23 1035  AST 19 44*  ALT 21 21  ALKPHOS 60 54  BILITOT 0.7 1.5*  PROT 6.3 6.2*  ALBUMIN  4.2 3.7   No results for input(s): LIPASE, AMYLASE in the last 168 hours. No results for input(s): AMMONIA in the last 168 hours.  ABG    Component Value Date/Time   PHART 7.437 12/21/2019 1402   PCO2ART 36.9 12/21/2019 1402   PO2ART 90.7 12/21/2019 1402   HCO3 24.4 12/21/2019 1402   TCO2 27 12/26/2023 1039   O2SAT 97.2 12/21/2019 1402     Coagulation Profile: Recent Labs  Lab 12/26/23 1035 12/26/23 1052  INR 1.2 1.3*    Cardiac Enzymes: No results for input(s):  CKTOTAL, CKMB, CKMBINDEX, TROPONINI in the last 168 hours.  HbA1C: Hgb A1c MFr Bld  Date/Time Value Ref Range Status  12/22/2023 01:52 PM 5.5 4.6 - 6.5 % Final    Comment:    Glycemic Control Guidelines for People with Diabetes:Non Diabetic:  <6%Goal of Therapy: <7%Additional Action Suggested:  >8%   06/24/2023 11:18 AM 5.6 4.6 - 6.5 % Final    Comment:    Glycemic Control Guidelines for People with Diabetes:Non Diabetic:  <6%Goal of Therapy: <7%Additional Action Suggested:  >8%     CBG: Recent Labs  Lab 12/27/23 1543 12/27/23 2014 12/27/23 2305 12/28/23 0342 12/28/23 0718  GLUCAP 139* 119* 101* 112* 108*     Raymond Chrisley V. Jude, MD [facility] 12/28/2023 10:58 AM

## 2023-12-28 NOTE — Procedures (Signed)
 Patient Name: Raymond Christensen  MRN: [id number]  Epilepsy Attending: Arlin MALVA Krebs  Referring Physician/Provider: Voncile Isles, MD  Duration: 12/27/2023 [phone] to 12/28/2023  9081  Patient history: 83 y.o. male on Eliquis  with fall last Saturday, presented as a code stroke with sudden onset speech difficulty. EEG to evaluate for seizure  Level of alertness: Awake, asleep  AEDs during EEG study: LEV  Technical aspects: This EEG study was done with scalp electrodes positioned according to the 10-20 International system of electrode placement. Electrical activity was reviewed with band pass filter of 1-70Hz , sensitivity of 7 uV/mm, display speed of 2mm/sec with a 60Hz  notched filter applied as appropriate. EEG data were recorded continuously and digitally stored.  Video monitoring was available and reviewed as appropriate.  Description: The posterior dominant rhythm consists of 8-9 Hz activity of moderate voltage (25-35 uV) seen predominantly in posterior head regions, symmetric and reactive to eye opening and eye closing. Sleep was characterized by vertex waves, sleep spindles (12 to 14 Hz), maximal frontocentral region.  EEG showed intermittent 3 to 6 Hz theta-delta slowing in left temporal region.   One seizure without clinical signs was noted arising from left temporal region on 12/27/2023 at 1314.  During seizure, EEG showed 5-6hz  theta slowing in left temporal region which then involved all of left hemisphere and evolved into 3-4hz  delta slowing admixed with sharp waves. Duration of seizure was about 35 seconds  Hyperventilation and photic stimulation were not performed.     ABNORMALITY - Seizure without clinical signs, left temporal region  - Intermittent slow,  left temporal region   IMPRESSION: This study showed one seizure without clinical signs arising from left temporal region on 12/27/2023 at 131, lasting for about 35 seconds Additionally there is cortical dysfunction arising from  left temporal region likely secondary to underlying structural abnormality.   Shevaun Lovan O Achol Azpeitia

## 2023-12-29 DIAGNOSIS — I10 Essential (primary) hypertension: Secondary | ICD-10-CM

## 2023-12-29 LAB — BASIC METABOLIC PANEL WITH GFR
Anion gap: 9 (ref 5–15)
BUN: 16 mg/dL (ref 8–23)
CO2: 22 mmol/L (ref 22–32)
Calcium: 8.6 mg/dL — ABNORMAL LOW (ref 8.9–10.3)
Chloride: 103 mmol/L (ref 98–111)
Creatinine, Ser: 0.83 mg/dL (ref 0.61–1.24)
GFR, Estimated: >60 mL/min (ref >60)
Glucose, Bld: 103 mg/dL — ABNORMAL HIGH (ref 70–99)
Potassium: 3.8 mmol/L (ref 3.5–5.1)
Sodium: 134 mmol/L — ABNORMAL LOW (ref 135–145)

## 2023-12-29 LAB — CBC WITH DIFFERENTIAL/PLATELET
Abs Immature Granulocytes: 0.05 K/uL (ref 0.00–0.07)
Basophils Absolute: 0 K/uL (ref 0.0–0.1)
Basophils Relative: 0 %
Eosinophils Absolute: 0.1 K/uL (ref 0.0–0.5)
Eosinophils Relative: 1 %
HCT: 39 % (ref 39.0–52.0)
Hemoglobin: 12.8 g/dL — ABNORMAL LOW (ref 13.0–17.0)
Immature Granulocytes: 1 %
Lymphocytes Relative: 22 %
Lymphs Abs: 2.4 K/uL (ref 0.7–4.0)
MCH: 28.6 pg (ref 26.0–34.0)
MCHC: 32.8 g/dL (ref 30.0–36.0)
MCV: 87.1 fL (ref 80.0–100.0)
Monocytes Absolute: 1.2 K/uL — ABNORMAL HIGH (ref 0.1–1.0)
Monocytes Relative: 11 %
Neutro Abs: 6.9 K/uL (ref 1.7–7.7)
Neutrophils Relative %: 65 %
Platelets: 176 K/uL (ref 150–400)
RBC: 4.48 MIL/uL (ref 4.22–5.81)
RDW: 14.2 % (ref 11.5–15.5)
WBC: 10.5 K/uL (ref 4.0–10.5)
nRBC: 0 % (ref 0.0–0.2)

## 2023-12-29 LAB — GLUCOSE, CAPILLARY
Glucose-Capillary: 107 mg/dL — ABNORMAL HIGH (ref 70–99)
Glucose-Capillary: 98 mg/dL (ref 70–99)
Glucose-Capillary: 113 mg/dL — ABNORMAL HIGH (ref 70–99)
Glucose-Capillary: 98 mg/dL (ref 70–99)
Glucose-Capillary: 114 mg/dL — ABNORMAL HIGH (ref 70–99)
Glucose-Capillary: 107 mg/dL — ABNORMAL HIGH (ref 70–99)

## 2023-12-29 NOTE — TOC CM/SW Note (Signed)
 Transition of Care [facility]) - Inpatient Brief Assessment   Patient Details  Name: Raymond Christensen MRN: [id number] Date of Birth: [birth year]  Transition of Care [facility]) CM/SW Contact:    Tresean Mattix M, RN Phone Number: 12/29/2023, 4:05 PM   Clinical Narrative: 83 year old male presented to the hospital as a code stroke 12/12 due to sudden onset expressive aphasia. Found to have acute left hemisphere hemorrhage with subarachnoid and intra-axial component. Did have a fall 6 days PTA.  PT/OT recommending HH follow up and RW; will need orders from provider. Will follow as patient progresses.     Transition of Care Asessment: Insurance and Status: Insurance coverage has been reviewed Patient has primary care physician: Yes Adolph Pry) Home environment has been reviewed: Lives with spouse at [employer] Prior level of function:: Independent Prior/Current Home Services: No current home services Social Drivers of Health Review: SDOH reviewed no interventions necessary Readmission risk has been reviewed: Yes Transition of care needs: transition of care needs identified, TOC will continue to follow   Mliss MICAEL Fass, RN, BSN  Trauma/Neuro ICU Case Manager [phone]

## 2023-12-29 NOTE — Progress Notes (Addendum)
 NAME:  Raymond Christensen, MRN:  [id number], DOB:  10-May-1940, LOS: 3 ADMISSION DATE:  12/26/2023, CONSULTATION DATE:  12/29/2023 REFERRING MD:  EDP, CHIEF COMPLAINT:  Aphasia   History of Present Illness:  83 year old male with past medical history of paroxysmal A-fib on Eliquis  last dose on day of presentation in the morning, CAD, heart failure with midrange ejection fraction 45% who presented to the hospital as a code stroke due to sudden onset expressive aphasia.  History obtained from patient, family and chart review.  Patient had a syncopal episode 6 days prior to his presentation and fell and hit the floor with bruising on his head around his right eye.  He is not able to recall what exactly happened.  On presentation to the hospital, he was noted to have significant expressive aphasia.  Code stroke and sent for head CT with findings concerning for acute left hemisphere hemorrhage with subarachnoid and intra-axial component.  Neurosurgery was consulted and they recommended repeat head CT in 6 hours.   Pertinent  Medical History  As above   Significant Hospital Events: Including procedures, antibiotic start and stop dates in addition to other pertinent events   Admitted to the ICU  12/26/23 12/13 LTM EEG added due to suspicion  Interim History / Subjective:   Patient is sitting in bed.  He just worked with PT. He is alert but not completely oriented. Per son he has some word finding difficulty.  Interventions that patient had significant symptomatic orthostatic hypotension blood pressure dropped from 135/64 to 94/58.  EEG did show nonclinical seizure and patient is on Keppra .   Objective    Blood pressure 112/62, pulse 77, temperature 98.1 F (36.7 C), temperature source Oral, resp. rate 15, SpO2 99%.        Intake/Output Summary (Last 24 hours) at 12/29/2023 0950 Last data filed at 12/29/2023 0500 Gross per 24 hour  Intake 400 ml  Output 1050 ml  Net -650 ml   There were  no vitals filed for this visit.  Examination: Physical exam: General: Alert oriented no distress. HEENT: [place]/AT, eyes anicteric.  moist mucus membranes Neuro: Alert, awake following commands slow to speak and and generally weak.  Power is 4/5 in all 4 extremities. Chest: Coarse breath sounds, no wheezes or rhonchi Heart: Regular rate and rhythm, no murmurs or gallops Abdomen: Soft, nontender, nondistended, bowel sounds present    Resolved problem list   Assessment and Plan   #Traumatic left temporal ICH #Traumatic SAH /small SDH LTM EEG shows 1 episode of seizure on 12/13 at 1314 --Continue Keppra  and LTM EEG discontinued by neurology. - BP goal of <160  - SCD for DVT ppx, no chemical DVT ppx - No need for nimodipine  -Diet advance, okay to mobilize - Neurosurgery and neurology following, overall appears improved -Low threshold to repeat CT head if there are any new neurological change.   #Afib on eliquis   (eliquis  already reversed) #HF with mid range EF   - Currently on metoprolol  blood pressure is reasonably well-controlled.  # Orthostatic Hypotension: - Discussed with cardiology because of and patient will continue to monitor him closely. - If this is persistent he will need treatment with midodrine.  Other home meds including Synthroid , bupropion  and Ativan  have been renewed Will monitor in ICU for now.  Once neurology and neurosurgery rounds on him if they are agreeable we will transfer him out of ICU   Spoke with the son and updated him and questions answered.  Labs  CBC: Recent Labs  Lab 12/26/23 1035 12/26/23 1039 12/29/23 0453  WBC 8.6  --  10.5  NEUTROABS 5.9  --  6.9  HGB 12.4* 12.6* 12.8*  HCT 38.4* 37.0* 39.0  MCV 89.3  --  87.1  PLT 170  --  176    Basic Metabolic Panel: Recent Labs  Lab 12/22/23 1352 12/26/23 1035 12/26/23 1039 12/29/23 0453  NA 141 135 140 134*  K 4.1 4.8 4.5 3.8  CL 106 101 102 103  CO2 29 26  --  22  GLUCOSE 74  113* 116* 103*  BUN 25* 13 15 16   CREATININE 0.91 1.00 1.10 0.83  CALCIUM  9.0 8.6*  --  8.6*   GFR: Estimated Creatinine Clearance: 74.8 mL/min (by C-G formula based on SCr of 0.83 mg/dL). Recent Labs  Lab 12/26/23 1035 12/29/23 0453  WBC 8.6 10.5    Liver Function Tests: Recent Labs  Lab 12/22/23 1352 12/26/23 1035  AST 19 44*  ALT 21 21  ALKPHOS 60 54  BILITOT 0.7 1.5*  PROT 6.3 6.2*  ALBUMIN  4.2 3.7   No results for input(s): LIPASE, AMYLASE in the last 168 hours. No results for input(s): AMMONIA in the last 168 hours.  ABG    Component Value Date/Time   PHART 7.437 12/21/2019 1402   PCO2ART 36.9 12/21/2019 1402   PO2ART 90.7 12/21/2019 1402   HCO3 24.4 12/21/2019 1402   TCO2 27 12/26/2023 1039   O2SAT 97.2 12/21/2019 1402     Coagulation Profile: Recent Labs  Lab 12/26/23 1035 12/26/23 1052  INR 1.2 1.3*    Cardiac Enzymes: No results for input(s): CKTOTAL, CKMB, CKMBINDEX, TROPONINI in the last 168 hours.  HbA1C: Hgb A1c MFr Bld  Date/Time Value Ref Range Status  12/22/2023 01:52 PM 5.5 4.6 - 6.5 % Final    Comment:    Glycemic Control Guidelines for People with Diabetes:Non Diabetic:  <6%Goal of Therapy: <7%Additional Action Suggested:  >8%   06/24/2023 11:18 AM 5.6 4.6 - 6.5 % Final    Comment:    Glycemic Control Guidelines for People with Diabetes:Non Diabetic:  <6%Goal of Therapy: <7%Additional Action Suggested:  >8%     CBG: Recent Labs  Lab 12/28/23 1554 12/28/23 1919 12/28/23 2308 12/29/23 0307 12/29/23 0750  GLUCAP 108* 122* 121* 107* 98    Tamela Stakes, MD  Attending Physician, Critical Care Medicine [place] [facility] See Amion for pager If no response to pager, please call [phone] until 7pm After 7pm, Please call E-link [phone]

## 2023-12-29 NOTE — Progress Notes (Signed)
 Physical Therapy Treatment Patient Details Name: Raymond Christensen MRN: [id number] DOB: 1940/04/08 Today's Date: 12/29/2023   History of Present Illness 83 year old male presented to the hospital as a code stroke 12/12 due to sudden onset expressive aphasia. Found to have acute left hemisphere hemorrhage with subarachnoid and intra-axial component. Did have a fall 6 days PTA. PHMx: paroxysmal A-fib,  CAD, heart failure with midrange ejection fraction 45%, HTN, DM, anxiety, NSTEMI, memory change, OA, skin CA left arm, vaso vagal responses to GI issues.    PT Comments  Pt able to ambulate with minA via R HHA 150' this date however reports lightheadedness and fatigue. Pt was orthostatic dropping from 135/64 to 94/58. RN and MD made aware. Pt continues with delayed response time, word finding difficulty, and impaired balance requiring assist for all mobility. Pt to benefit from HHPT to address above deficits and transition back to indep. Acute PT to cont to follow.    If plan is discharge home, recommend the following: A little help with walking and/or transfers;A little help with bathing/dressing/bathroom;Assistance with cooking/housework;Assist for transportation   Can travel by private vehicle        Equipment Recommendations  Rolling walker (2 wheels)    Recommendations for Other Services       Precautions / Restrictions Precautions Precautions: Fall Recall of Precautions/Restrictions: Intact Restrictions Weight Bearing Restrictions Per Provider Order: No     Mobility  Bed Mobility Overal bed mobility: Needs Assistance Bed Mobility: Supine to Sit     Supine to sit: Contact guard, HOB elevated, Used rails     General bed mobility comments: increase time, verbal cues to use rails, HOB elevated, reports lightheadedness, Systolic BP dropped from 135 to 110.    Transfers Overall transfer level: Needs assistance Equipment used: 1 person hand held assist Transfers: Sit to/from  Stand Sit to Stand: Contact guard assist           General transfer comment: slow and guarded, wide base of support, reports feeling very tired and lightheaded    Ambulation/Gait Ambulation/Gait assistance: Min assist Gait Distance (Feet): 150 Feet Assistive device: 1 person hand held assist Gait Pattern/deviations: Step-through pattern, Decreased stride length, Wide base of support Gait velocity: dec Gait velocity interpretation: <1.31 ft/sec, indicative of household ambulator   General Gait Details: pt slow and guarded, reports not feeling self, no episode of LOB but stopped frequently for rest breaks   Stairs             Wheelchair Mobility     Tilt Bed    Modified Rankin (Stroke Patients Only) Modified Rankin (Stroke Patients Only) Pre-Morbid Rankin Score: No symptoms Modified Rankin: Moderate disability     Balance Overall balance assessment: Needs assistance Sitting-balance support: No upper extremity supported Sitting balance-Leahy Scale: Fair     Standing balance support: Single extremity supported Standing balance-Leahy Scale: Poor Standing balance comment: prefers external support                            Communication Communication Communication: Impaired Factors Affecting Communication: Difficulty expressing self  Cognition Arousal: Alert Behavior During Therapy: WFL for tasks assessed/performed   PT - Cognitive impairments: Initiation, Attention                       PT - Cognition Comments: delayed response time, word finding difficulty Following commands: Impaired Following commands impaired: Follows one step commands with increased time  Cueing Cueing Techniques: Verbal cues, Gestural cues  Exercises      General Comments General comments (skin integrity, edema, etc.): Orthostatic drop in BP to 94/58 s/p walking      Pertinent Vitals/Pain Pain Assessment Pain Assessment: No/denies pain    Home  Living     Available Help at Discharge: Available 24 hours/day Type of Home: Independent living facility                  Prior Function            PT Goals (current goals can now be found in the care plan section) Acute Rehab PT Goals Patient Stated Goal: get out of here Progress towards PT goals: Progressing toward goals    Frequency    Min 3X/week      PT Plan      Co-evaluation              AM-PAC PT 6 Clicks Mobility   Outcome Measure  Help needed turning from your back to your side while in a flat bed without using bedrails?: A Little Help needed moving from lying on your back to sitting on the side of a flat bed without using bedrails?: A Little Help needed moving to and from a bed to a chair (including a wheelchair)?: A Little Help needed standing up from a chair using your arms (e.g., wheelchair or bedside chair)?: A Little Help needed to walk in hospital room?: A Little Help needed climbing 3-5 steps with a railing? : A Little 6 Click Score: 18    End of Session Equipment Utilized During Treatment: Gait belt Activity Tolerance: Patient limited by fatigue Patient left: in chair;with call bell/phone within reach;with chair alarm set Nurse Communication: Mobility status PT Visit Diagnosis: Unsteadiness on feet (R26.81)     Time: 9194-9168 PT Time Calculation (min) (ACUTE ONLY): 26 min  Charges:    $Gait Training: 8-22 mins $Therapeutic Activity: 8-22 mins PT General Charges $$ ACUTE PT VISIT: 1 Visit                     Norene Ames, PT, DPT Acute Rehabilitation Services Secure chat preferred Office #: [phone]    Norene CHRISTELLA Ames 12/29/2023, 1:09 PM

## 2023-12-29 NOTE — Plan of Care (Signed)
°  Problem: Education: Goal: Knowledge of disease or condition will improve Outcome: Progressing Goal: Knowledge of secondary prevention will improve (MUST DOCUMENT ALL) Outcome: Progressing Goal: Knowledge of patient specific risk factors will improve (DELETE if not current risk factor) Outcome: Progressing   Problem: Spontaneous Subarachnoid Hemorrhage Tissue Perfusion: Goal: Complications of Spontaneous Subarachnoid Hemorrhage will be minimized Outcome: Progressing   Problem: Health Behavior/Discharge Planning: Goal: Ability to manage health-related needs will improve Outcome: Progressing Goal: Goals will be collaboratively established with patient/family Outcome: Progressing   Problem: Self-Care: Goal: Ability to participate in self-care as condition permits will improve Outcome: Progressing Goal: Verbalization of feelings and concerns over difficulty with self-care will improve Outcome: Progressing Goal: Ability to communicate needs accurately will improve Outcome: Progressing   Problem: Nutrition: Goal: Risk of aspiration will decrease Outcome: Progressing Goal: Dietary intake will improve Outcome: Progressing   Problem: Activity: Goal: Risk for activity intolerance will decrease Outcome: Progressing   Problem: Pain Managment: Goal: General experience of comfort will improve and/or be controlled Outcome: Progressing   Problem: Safety: Goal: Ability to remain free from injury will improve Outcome: Progressing   Problem: Skin Integrity: Goal: Risk for impaired skin integrity will decrease Outcome: Progressing

## 2023-12-29 NOTE — Procedures (Signed)
 Patient Name: Alessander Sikorski  MRN: [id number]  Epilepsy Attending: Arlin MALVA Krebs  Referring Physician/Provider: Voncile Isles, MD  Duration: 12/28/2023 0918 to 12/28/2023  1218   Patient history: 83 y.o. male on Eliquis  with fall last Saturday, presented as a code stroke with sudden onset speech difficulty. EEG to evaluate for seizure   Level of alertness: Awake, asleep   AEDs during EEG study: LEV   Technical aspects: This EEG study was done with scalp electrodes positioned according to the 10-20 International system of electrode placement. Electrical activity was reviewed with band pass filter of 1-70Hz , sensitivity of 7 uV/mm, display speed of 9mm/sec with a 60Hz  notched filter applied as appropriate. EEG data were recorded continuously and digitally stored.  Video monitoring was available and reviewed as appropriate.   Description: The posterior dominant rhythm consists of 8-9 Hz activity of moderate voltage (25-35 uV) seen predominantly in posterior head regions, symmetric and reactive to eye opening and eye closing. Sleep was characterized by vertex waves, sleep spindles (12 to 14 Hz), maximal frontocentral region.  EEG showed intermittent 3 to 6 Hz theta-delta slowing in left temporal region. Hyperventilation and photic stimulation were not performed.      ABNORMALITY - Intermittent slow,  left temporal region    IMPRESSION: This study was suggestive of cortical dysfunction arising from left temporal region likely secondary to underlying structural abnormality. No seizures were noted.    Jocelin Schuelke O Donnie Gedeon

## 2023-12-29 NOTE — Progress Notes (Signed)
 Occupational Therapy Treatment Patient Details Name: Raymond Christensen MRN: [id number] DOB: 1940/06/09 Today's Date: 12/29/2023   History of present illness 83 year old male presented to the hospital as a code stroke 12/12 due to sudden onset expressive aphasia. Found to have acute left hemisphere hemorrhage with subarachnoid and intra-axial component. Did have a fall 6 days PTA. PHMx: paroxysmal A-fib,  CAD, heart failure with midrange ejection fraction 45%, HTN, DM, anxiety, NSTEMI, memory change, OA, skin CA left arm, vaso vagal responses to GI issues.   OT comments  Asked RN to order compression stockings for pt prior to the session, stockings donned on arrival.  Pt is making limited progress towards their acute OT goals. Overall he is physically able to complete functional mobility and Adls with very little assist however his BP is creating a safety concern. Pt was orthostatic with all position changes. He describes symptoms as being foggy. Symptoms do not get worse or better with BP fluctuation. OT to continue to follow acutely to facilitate progress towards established goals. Pt will continue to benefit from [facility].  Orthostatic BPs Reclined in chair with feet up 106/65  Feet down, back reclined  112/66  Sitting upright  77/47  Sitting after 3 minutes  103/59  Standing 68/47  Standing after 3 min 90/59  Sitting in recliner with feet up 126/61         If plan is discharge home, recommend the following:  A little help with walking and/or transfers;A little help with bathing/dressing/bathroom;Assist for transportation;Help with stairs or ramp for entrance   Equipment Recommendations  None recommended by OT       Precautions / Restrictions Precautions Precautions: Fall Recall of Precautions/Restrictions: Intact Restrictions Weight Bearing Restrictions Per Provider Order: No       Mobility Bed Mobility               General bed mobility comments: OOB in chair     Transfers Overall transfer level: Needs assistance Equipment used: 1 person hand held assist Transfers: Sit to/from Stand Sit to Stand: Contact guard assist           General transfer comment: pt reports symptoms as foggy     Balance Overall balance assessment: Needs assistance Sitting-balance support: No upper extremity supported, Feet supported Sitting balance-Leahy Scale: Fair     Standing balance support: Single extremity supported Standing balance-Leahy Scale: Poor                             ADL either performed or assessed with clinical judgement   ADL Overall ADL's : Needs assistance/impaired Eating/Feeding: Independent;Sitting   Grooming: Set up;Sitting                               Functional mobility during ADLs: Contact guard assist General ADL Comments: no AD and no physical assist needed for tasks completed. pt limited by orthostatic BP    Extremity/Trunk Assessment Upper Extremity Assessment Upper Extremity Assessment: Generalized weakness   Lower Extremity Assessment Lower Extremity Assessment: Defer to PT evaluation        Vision   Vision Assessment?: No apparent visual deficits Additional Comments: recent cataract sx, recovering well   Perception Perception Perception: Within Functional Limits   Praxis Praxis Praxis: WFL   Communication Communication Communication: No apparent difficulties   Cognition Arousal: Alert Behavior During Therapy: WFL for tasks assessed/performed Cognition: No apparent  impairments         Following commands: Intact        Cueing   Cueing Techniques: Verbal cues        General Comments orthostatic drop in BP    Pertinent Vitals/ Pain       Pain Assessment Pain Assessment: No/denies pain  Home Living     Available Help at Discharge: Available 24 hours/day Type of Home: Independent living facility                              Lives With: Spouse     Prior Functioning/Environment              Frequency  Min 2X/week        Progress Toward Goals  OT Goals(current goals can now be found in the care plan section)  Progress towards OT goals: Progressing toward goals  Acute Rehab OT Goals Patient Stated Goal: to feel better OT Goal Formulation: With patient Time For Goal Achievement: 01/10/24 Potential to Achieve Goals: Good ADL Goals Pt Will Perform Grooming: Independently;standing Pt Will Perform Upper Body Bathing: Independently;sitting;standing Pt Will Perform Lower Body Bathing: with modified independence;with adaptive equipment;sit to/from stand Pt Will Perform Upper Body Dressing: Independently;sitting;standing Pt Will Perform Lower Body Dressing: with modified independence;with adaptive equipment;sit to/from stand Pt Will Transfer to Toilet: Independently;ambulating;grab bars Pt Will Perform Toileting - Clothing Manipulation and hygiene: Independently;sit to/from stand Additional ADL Goal #1: pt will be independent in and OOB for basic ADLs  Plan         AM-PAC OT 6 Clicks Daily Activity     Outcome Measure   Help from another person eating meals?: None Help from another person taking care of personal grooming?: A Little Help from another person toileting, which includes using toliet, bedpan, or urinal?: A Little Help from another person bathing (including washing, rinsing, drying)?: A Little Help from another person to put on and taking off regular upper body clothing?: A Little Help from another person to put on and taking off regular lower body clothing?: A Little 6 Click Score: 19    End of Session    OT Visit Diagnosis: Unsteadiness on feet (R26.81);Other abnormalities of gait and mobility (R26.89)   Activity Tolerance Patient tolerated treatment well   Patient Left in chair;with call bell/phone within reach   Nurse Communication Mobility status (BP)        Time: 8973-8951 OT Time  Calculation (min): 22 min  Charges: OT General Charges $OT Visit: 1 Visit OT Treatments $Therapeutic Activity: 8-22 mins  Lucie Kendall, OTR/L Acute Rehabilitation Services Office [phone] Secure Chat Communication Preferred   [name] 12/29/2023, 12:13 PM

## 2023-12-29 NOTE — Evaluation (Signed)
 Speech Language Pathology Evaluation Patient Details Name: Raymond Christensen MRN: [id number] DOB: 10-Nov-1940 Today's Date: 12/29/2023 Time: 9045-8995 SLP Time Calculation (min) (ACUTE ONLY): 10 min  Problem List:  Patient Active Problem List   Diagnosis Date Noted   Expressive aphasia 12/28/2023   Seizure (HCC) 12/28/2023   Subarachnoid hemorrhage (HCC) 12/26/2023   GAD (generalized anxiety disorder) 01/10/2023   Pain in left wrist 11/20/2022   S/P CABG (coronary artery bypass graft) 06/27/2022   Hypokalemia 06/27/2022   NSTEMI (non-ST elevated myocardial infarction) (HCC) 06/26/2022   PVC's (premature ventricular contractions) 05/31/2022   Lipoma of right upper extremity 03/02/2021   Trigger little finger of left hand 09/29/2020   Secondary hypercoagulable state 02/29/2020   Depression, major, single episode, moderate (HCC) 02/28/2020   Nonrheumatic tricuspid valve regurgitation    S/P mitral valve clip implantation 12/23/2019   Mitral valve disease 12/23/2019   Nonrheumatic mitral valve regurgitation    Persistent atrial fibrillation (HCC)    Chronic HFrEF    Acute idiopathic gout involving toe of left foot    Thrombocytopenia 09/08/2019   CAD (coronary artery disease)    Type 2 diabetes mellitus in patient with obesity (HCC) 06/22/2019   Major depressive disorder    Obstructive sleep apnea on CPAP    Hypothyroidism 10/15/2017   Subclavian artery stenosis, right 06/15/2017   Severe obesity (BMI 35.0-39.9) with comorbidity (HCC) 02/26/2017   Essential hypertension 02/26/2017   Memory change 01/10/2017   Urge incontinence of urine 01/10/2017   Lightheaded 06/25/2016   AK (actinic keratosis) 01/03/2016   H/O syncope 03/06/2014   Occlusion and stenosis of left carotid artery 01/11/2014   Routine history and physical examination of adult 11/08/2013   History of repair of hip joint 04/20/2013   Hyperlipidemia 04/20/2013   Presence of artificial hip joint 04/20/2013   Vitamin  D deficiency 06/18/2010   Concussion 01/24/2010   Elevated prostate specific antigen (PSA) 01/01/2007   Benign prostatic hyperplasia without urinary obstruction 04/22/2006   Osteoarthrosis 04/22/2006   Dyslipidemia 04/02/2006   Past Medical History:  Past Medical History:  Diagnosis Date   Arthritis    CAD (coronary artery disease)    Cancer (HCC)    skin lt. arm.   Carotid artery disease    s/p left CEA 01/17/14   Chronic systolic CHF (congestive heart failure) [facility])    Concussion Summer 2012   Essential hypertension 02/26/2017   Heart disease    Hyperlipidemia    Hypothyroidism 10/15/2017   Major depressive disorder    Mitral valve regurgitation    Myocardial infarction [facility]) 1996, 2021   Obstructive sleep apnea on CPAP    Persistent atrial fibrillation (HCC)    Poor flexibility of tendon 12/01/2018   S/P mitral valve clip implantation 12/23/2019   s/p TEER with MitraClip with one XTW by Dr. Wonda   Subungual hematoma of digit of hand 12/01/2018   Subungual hematoma of right foot 12/01/2018   Type 2 diabetes mellitus without complication, without long-term current use of insulin  (HCC) 02/26/2017   Past Surgical History:  Past Surgical History:  Procedure Laterality Date   ATRIAL FIBRILLATION ABLATION N/A 02/01/2020   Procedure: ATRIAL FIBRILLATION ABLATION;  Surgeon: Inocencio Soyla Lunger, MD;  Location: [facility] INVASIVE CV LAB;  Service: Cardiovascular;  Laterality: N/A;   BYPASS GRAFT  2001   CARDIAC CATHETERIZATION     CARDIOVERSION N/A 11/08/2019   Procedure: CARDIOVERSION;  Surgeon: Delford Maude BROCKS, MD;  Location: [facility] ENDOSCOPY;  Service: Cardiovascular;  Laterality:  N/A;   CAROTID ENDARTERECTOMY Left 01/17/2014   CORONARY ANGIOPLASTY     CORONARY ARTERY BYPASS GRAFT     LEFT HEART CATH AND CORS/GRAFTS ANGIOGRAPHY N/A 06/26/2022   Procedure: LEFT HEART CATH AND CORS/GRAFTS ANGIOGRAPHY;  Surgeon: Burnard Debby LABOR, MD;  Location: [facility] INVASIVE CV LAB;  Service:  Cardiovascular;  Laterality: N/A;   RIGHT/LEFT HEART CATH AND CORONARY/GRAFT ANGIOGRAPHY N/A 09/09/2019   Procedure: RIGHT/LEFT HEART CATH AND CORONARY/GRAFT ANGIOGRAPHY;  Surgeon: Court Dorn PARAS, MD;  Location: [facility] INVASIVE CV LAB;  Service: Cardiovascular;  Laterality: N/A;   TEE WITHOUT CARDIOVERSION N/A 09/13/2019   Procedure: TRANSESOPHAGEAL ECHOCARDIOGRAM (TEE);  Surgeon: Lonni Slain, MD;  Location: [facility] ENDOSCOPY;  Service: Cardiovascular;  Laterality: N/A;   TEE WITHOUT CARDIOVERSION N/A 12/23/2019   Procedure: TRANSESOPHAGEAL ECHOCARDIOGRAM (TEE);  Surgeon: Wonda Sharper, MD;  Location[facility] INVASIVE CV LAB;  Service: Cardiovascular;  Laterality: N/A;   TEE WITHOUT CARDIOVERSION  02/01/2020   Procedure: TRANSESOPHAGEAL ECHOCARDIOGRAM (TEE);  Surgeon: Inocencio Soyla Lunger, MD;  Location: [facility] INVASIVE CV LAB;  Service: Cardiovascular;;   TOTAL HIP ARTHROPLASTY  1994, 1995   TOTAL HIP ARTHROPLASTY Bilateral    TRANSCATHETER MITRAL EDGE TO EDGE REPAIR N/A 12/23/2019   Procedure: MITRAL VALVE REPAIR;  Surgeon: Wonda Sharper, MD;  Location: [facility] INVASIVE CV LAB;  Service: Cardiovascular;  Laterality: N/A;   TRANSURETHRAL RESECTION OF PROSTATE N/A 12/27/2022   Procedure: TRANSURETHRAL RESECTION OF THE PROSTATE (TURP);  Surgeon: Nieves Cough, MD;  Location: [facility] ORS;  Service: Urology;  Laterality: N/A;  75 minute case   HPI:  83 year old male presented to the hospital as a code stroke 12/12 due to sudden onset expressive aphasia. Found to have acute left hemisphere hemorrhage with subarachnoid and intra-axial component. Did have a fall 6 days PTA. PHMx: paroxysmal A-fib,  CAD, heart failure with midrange ejection fraction 45%, HTN, DM, anxiety, NSTEMI, memory change, OA, skin CA left arm   Assessment / Plan / Recommendation Clinical Impression  Pt reports significant improvement related to expressive and receptive language, now presenting without noted deficits at the conversation  level. He also scored WFL on all subsections of the Cognistat with the exception of delayed recall, which pt reports is challenging at baseline. He independently recalled 2/4 novel words after a delay and accurately recalled the remaining two words given semantic cueing. Pt feels he is at his baseline. Ongoing SLP f/u is not needed acutely, will sign off.     SLP Assessment  SLP Recommendation/Assessment: Patient does not need any further Speech Language Pathology Services SLP Visit Diagnosis: Cognitive communication deficit (R41.841)     Assistance Recommended at Discharge  Intermittent Supervision/Assistance  Functional Status Assessment Patient has had a recent decline in their functional status and demonstrates the ability to make significant improvements in function in a reasonable and predictable amount of time.  Frequency and Duration           SLP Evaluation Cognition  Overall Cognitive Status: History of cognitive impairments - at baseline Arousal/Alertness: Awake/alert Orientation Level: Oriented X4 Attention: Selective Selective Attention: Appears intact Memory: Impaired Memory Impairment: Storage deficit;Retrieval deficit Awareness: Appears intact Problem Solving: Appears intact Executive Function: Reasoning Reasoning: Appears intact       Comprehension  Auditory Comprehension Overall Auditory Comprehension: Appears within functional limits for tasks assessed    Expression Expression Primary Mode of Expression: Verbal Verbal Expression Overall Verbal Expression: Appears within functional limits for tasks assessed   Oral / Motor  Oral Motor/Sensory Function Overall Oral  Motor/Sensory Function: Within functional limits Motor Speech Overall Motor Speech: Appears within functional limits for tasks assessed            Damien Blumenthal, M.A., CCC-SLP Speech Language Pathology, Acute Rehabilitation Services  Secure Chat preferred [phone]  12/29/2023, 10:36  AM

## 2023-12-30 ENCOUNTER — Telehealth: Payer: Self-pay

## 2023-12-30 ENCOUNTER — Other Ambulatory Visit (HOSPITAL_COMMUNITY): Payer: Self-pay

## 2023-12-30 LAB — GLUCOSE, CAPILLARY
Glucose-Capillary: 121 mg/dL — ABNORMAL HIGH (ref 70–99)
Glucose-Capillary: 108 mg/dL — ABNORMAL HIGH (ref 70–99)
Glucose-Capillary: 102 mg/dL — ABNORMAL HIGH (ref 70–99)
Glucose-Capillary: 107 mg/dL — ABNORMAL HIGH (ref 70–99)
Glucose-Capillary: 100 mg/dL — ABNORMAL HIGH (ref 70–99)
Glucose-Capillary: 120 mg/dL — ABNORMAL HIGH (ref 70–99)

## 2023-12-30 LAB — COMPREHENSIVE METABOLIC PANEL WITH GFR
ALT: 18 U/L (ref 0–44)
AST: 16 U/L (ref 15–41)
Albumin: 3 g/dL — ABNORMAL LOW (ref 3.5–5.0)
Alkaline Phosphatase: 59 U/L (ref 38–126)
Anion gap: 6 (ref 5–15)
BUN: 17 mg/dL (ref 8–23)
CO2: 26 mmol/L (ref 22–32)
Calcium: 8.1 mg/dL — ABNORMAL LOW (ref 8.9–10.3)
Chloride: 101 mmol/L (ref 98–111)
Creatinine, Ser: 0.79 mg/dL (ref 0.61–1.24)
GFR, Estimated: >60 mL/min (ref >60)
Glucose, Bld: 105 mg/dL — ABNORMAL HIGH (ref 70–99)
Potassium: 3.7 mmol/L (ref 3.5–5.1)
Sodium: 133 mmol/L — ABNORMAL LOW (ref 135–145)
Total Bilirubin: 1 mg/dL (ref 0.0–1.2)
Total Protein: 5.7 g/dL — ABNORMAL LOW (ref 6.5–8.1)

## 2023-12-30 LAB — CBC
HCT: 38.2 % — ABNORMAL LOW (ref 39.0–52.0)
Hemoglobin: 12.8 g/dL — ABNORMAL LOW (ref 13.0–17.0)
MCH: 29.4 pg (ref 26.0–34.0)
MCHC: 33.5 g/dL (ref 30.0–36.0)
MCV: 87.6 fL (ref 80.0–100.0)
Platelets: 163 K/uL (ref 150–400)
RBC: 4.36 MIL/uL (ref 4.22–5.81)
RDW: 14 % (ref 11.5–15.5)
WBC: 9.9 K/uL (ref 4.0–10.5)
nRBC: 0 % (ref 0.0–0.2)

## 2023-12-30 MED ORDER — LACTATED RINGERS IV BOLUS
250.0000 mL | Freq: Once | INTRAVENOUS | Status: AC
  Administered 2023-12-30: 10:00:00 250 mL via INTRAVENOUS

## 2023-12-30 MED ORDER — HYDRALAZINE HCL 20 MG/ML IJ SOLN
5.0000 mg | Freq: Once | INTRAMUSCULAR | Status: AC
  Administered 2023-12-30: 23:00:00 5 mg via INTRAVENOUS
  Filled 2023-12-30: qty 1

## 2023-12-30 NOTE — Progress Notes (Signed)
 PROGRESS NOTE    Raymond Christensen  FMW:[id number] DOB: 03-06-1940 DOA: 12/26/2023 PCP: Frann Mabel Mt, DO  Chief Complaint  Patient presents with   Code Stroke    Brief Narrative:   83 year old male with past medical history of paroxysmal Raymond Christensen-fib on Eliquis  last dose on day of presentation in the morning, CAD, heart failure with midrange ejection fraction 45% who presented to the hospital as Raymond Christensen code stroke due to sudden onset expressive aphasia.   History obtained from patient, family and chart review.  Patient had Raymond Wolfrey syncopal episode 6 days prior to his presentation and fell and hit the floor with bruising on his head around his right eye.  He is not able to recall what exactly happened.   On presentation to the hospital, he was noted to have significant expressive aphasia.  Code stroke and sent for head CT with findings concerning for acute left hemisphere hemorrhage with subarachnoid and intra-axial component.   Neurosurgery was consulted and they recommended repeat head CT in 6 hours.  Significant Events 12/12 head CT with acute L hemisphere hemorrhage with suspected subarachnoid and intraaxial components with regional hypodense edema Admitted to the ICU  12/26/23 12/13 unchanged L sided subarachnoid hemorrhage, subdural hemorrhage, and suspected L temporal intraparenchymal hemorrhage and edema 12/13 LTM EEG - seizure noted 12/14 neurology signed off 12/14 last neurosurgery note 12/15 orthostatic positive 12/16 TRH assumed care   Assessment & Plan:   Principal Problem:   Subarachnoid hemorrhage (HCC) Active Problems:   Expressive aphasia   Seizure (HCC)  Possible discharge later today if he does well with therapy  Traumatic L Temporal ICH Traumatic SAH  Subdural Hemorrhage Expressive Aphasia  Seizures Head CT at presentation 12/12 with acute L hemisphere hemorrhage with suspected subarachnoid and intraaxial components with regional hypodense edema CTA head/neck  12/12 negative for intracranial aneurysm, LVO, or CTA spot sign in association with acute intracranial hemorrhage - atherosclerosis including moderate to severe stenosis of the L ACA A1 segment, up to moderate bilateral supraclinoid ICA stenosis - previous L carotid endarterectomy CT 12/13 unchanged L sided subarachnoid hemorrhage, subdural hemorrhage and suspected L temporal intraparenchymal hemorrhage and edema Neurosurgery saw last on 12/14 Neurology signed off 12/14 - recommending keppra  1 g BID for seizures in setting of traumatic ICH, SDH, SAH, hypertensive emergency, coagulopathy.  Seizure precautions.  Neurology follow up in 8-12 weeks.   Per [place]  DMV statutes, patients with seizures are not allowed to drive until  they have been seizure-free for six months. Use caution when using heavy equipment or power tools. Avoid working on ladders or at heights. Take showers instead of baths. Ensure the water temperature is not too high on the home water heater. Do not go swimming alone. When caring for infants or small children, sit down when holding, feeding, or changing them to minimize risk of injury to the child in the event you have Raymond Christensen seizure. Also, Maintain good sleep hygiene. Avoid alcohol.  Orthostatic Hypotension SBP dropped from 140's to 90's yesterday with therapy Continue compression stockings Follow symptoms today, consider holding metoprolol  if persistent symptoms  Atrial Fibrillation  Eliquis  has been discontinued in the setting of above Metoprolol    Hypothyroidism Synthroid , cytomel   BPH Overactive Bladder Proscar  Not taking gemtesa  Depression  Anxiety Continue paxil , ativan   Will discontinue wellbutrin  at discharge due to his seizure above  Dyslipidemia Statin, zetia  on hold      DVT prophylaxis: SCD Code Status: full Family Communication: none Disposition:   Status  is: Inpatient Remains inpatient appropriate because: need for continued inpatient  care, therapy evals   Consultants:  Neurology Neurosurgery PCCM  Procedures:  EEG IMPRESSION: This study showed one seizure without clinical signs arising from left temporal region on 12/27/2023 at 131, lasting for about 35 seconds Additionally there is cortical dysfunction arising from left temporal region likely secondary to underlying structural abnormality.    Antimicrobials:  Anti-infectives (From admission, onward)    None       Subjective: Feels tired No other complaints Denies LH at this time  Objective: Vitals:   12/30/23 0500 12/30/23 0600 12/30/23 0700 12/30/23 0800  BP: 120/68 105/67 120/71 126/68  Pulse: 69 72 76 67  Resp: 15 13 14 14   Temp:      TempSrc:      SpO2: 100% 100% 92% 100%    Intake/Output Summary (Last 24 hours) at 12/30/2023 0830 Last data filed at 12/29/2023 1800 Gross per 24 hour  Intake --  Output 350 ml  Net -350 ml   There were no vitals filed for this visit.  Examination:  General exam: Appears calm and comfortable  Respiratory system: Clear to auscultation. Respiratory effort normal. Cardiovascular system: RRR Central nervous system: Alert and oriented. CN 2-12 intact.  5/5 strength throughout.  FNF intact. Extremities: no LEE   Data Reviewed: I have personally reviewed following labs and imaging studies  CBC: Recent Labs  Lab 12/26/23 1035 12/26/23 1039 12/29/23 0453 12/30/23 0312  WBC 8.6  --  10.5 9.9  NEUTROABS 5.9  --  6.9  --   HGB 12.4* 12.6* 12.8* 12.8*  HCT 38.4* 37.0* 39.0 38.2*  MCV 89.3  --  87.1 87.6  PLT 170  --  176 163    Basic Metabolic Panel: Recent Labs  Lab 12/26/23 1035 12/26/23 1039 12/29/23 0453 12/30/23 0312  NA 135 140 134* 133*  K 4.8 4.5 3.8 3.7  CL 101 102 103 101  CO2 26  --  22 26  GLUCOSE 113* 116* 103* 105*  BUN 13 15 16 17   CREATININE 1.00 1.10 0.83 0.79  CALCIUM  8.6*  --  8.6* 8.1*    GFR: Estimated Creatinine Clearance: 77.6 mL/min (by C-G formula based on  SCr of 0.79 mg/dL).  Liver Function Tests: Recent Labs  Lab 12/26/23 1035 12/30/23 0312  AST 44* 16  ALT 21 18  ALKPHOS 54 59  BILITOT 1.5* 1.0  PROT 6.2* 5.7*  ALBUMIN  3.7 3.0*    CBG: Recent Labs  Lab 12/29/23 1608 12/29/23 1927 12/29/23 2320 12/30/23 0328 12/30/23 0725  GLUCAP 98 114* 107* 121* 108*     Recent Results (from the past 240 hours)  MRSA Next Gen by PCR, Nasal     Status: None   Collection Time: 12/26/23  3:51 PM   Specimen: Nasal Mucosa; Nasal Swab  Result Value Ref Range Status   MRSA by PCR Next Gen NOT DETECTED NOT DETECTED Final    Comment: (NOTE) The GeneXpert MRSA Assay (FDA approved for NASAL specimens only), is one component of Raymond Christensen comprehensive MRSA colonization surveillance program. It is not intended to diagnose MRSA infection nor to guide or monitor treatment for MRSA infections. Test performance is not FDA approved in patients less than 62 years old. Performed at [facility], [address], [place], [place] [address]          Radiology Studies: Overnight EEG with video Result Date: 12/28/2023 Raymond Raymond KIDD, MD     12/28/2023  10:37 AM Patient Name: Raymond Christensen MRN: [id number] Epilepsy Attending: Arlin MALVA Christensen Referring Physician/Provider: Voncile Isles, MD Duration: 12/27/2023 9081 to 12/28/2023  9081 Patient history: 83 y.o. male on Eliquis  with fall last Saturday, presented as Raymond Christensen code stroke with sudden onset speech difficulty. EEG to evaluate for seizure Level of alertness: Awake, asleep AEDs during EEG study: LEV Technical aspects: This EEG study was done with scalp electrodes positioned according to the 10-20 International system of electrode placement. Electrical activity was reviewed with band pass filter of 1-70Hz , sensitivity of 7 uV/mm, display speed of 45mm/sec with Aniyiah Zell 60Hz  notched filter applied as appropriate. EEG data were recorded continuously and digitally stored.  Video monitoring was available and reviewed  as appropriate. Description: The posterior dominant rhythm consists of 8-9 Hz activity of moderate voltage (25-35 uV) seen predominantly in posterior head regions, symmetric and reactive to eye opening and eye closing. Sleep was characterized by vertex waves, sleep spindles (12 to 14 Hz), maximal frontocentral region.  EEG showed intermittent 3 to 6 Hz theta-delta slowing in left temporal region. One seizure without clinical signs was noted arising from left temporal region on 12/27/2023 at 1314.  During seizure, EEG showed 5-6hz  theta slowing in left temporal region which then involved all of left hemisphere and evolved into 3-4hz  delta slowing admixed with sharp waves. Duration of seizure was about 35 seconds Hyperventilation and photic stimulation were not performed.   ABNORMALITY - Seizure without clinical signs, left temporal region - Intermittent slow,  left temporal region IMPRESSION: This study showed one seizure without clinical signs arising from left temporal region on 12/27/2023 at 131, lasting for about 35 seconds Additionally there is cortical dysfunction arising from left temporal region likely secondary to underlying structural abnormality. Raymond Christensen        Scheduled Meds:  Chlorhexidine  Gluconate Cloth  6 each Topical Q0600   docusate sodium   100 mg Oral BID   finasteride   5 mg Oral Daily   levETIRAcetam   1,000 mg Oral BID   levothyroxine   75 mcg Oral Daily   liothyronine   10 mcg Oral Daily   metoprolol  succinate  12.5 mg Oral Daily   pantoprazole   40 mg Oral Daily   Or   pantoprazole  (PROTONIX ) IV  40 mg Intravenous Daily   PARoxetine   10 mg Oral Daily   prednisoLONE  Acet-Moxifloxacin    Ophthalmic QID   Continuous Infusions:   LOS: 4 days    Time spent: over 30 min     Meliton Monte, MD [facility]   To contact the attending provider between 7A-7P or the covering provider during after hours 7P-7A, please log into the web site [email or web address] and  access using universal Edie password for that web site. If you do not have the password, please call the hospital operator.  12/30/2023, 8:30 AM

## 2023-12-30 NOTE — Progress Notes (Signed)
 Physical Therapy Treatment Patient Details Name: Raymond Christensen MRN: [id number] DOB: [birth year] Today's Date: 12/30/2023   History of Present Illness 83 year old male presented to the hospital as a code stroke 12/12 due to sudden onset expressive aphasia. Found to have acute left hemisphere hemorrhage with subarachnoid and intra-axial component. Did have a fall 6 days PTA. PHMx: paroxysmal A-fib,  CAD, heart failure with midrange ejection fraction 45%, HTN, DM, anxiety, NSTEMI, memory change, OA, skin CA left arm, vaso vagal responses to GI issues.    PT Comments  Pt received in bed and agreeable to participation in therapy. Orthostatic hypotension continues to limit mobility. TED hose in place for session. Pt required CGA bed mobility, min assist sit to stand, and CGA amb 5' bed to recliner. Unable to safely progress gait due to symptomatic hypotension. Pt describes as 'feeling off.' Pt instructed on simple BLE exercises to perform in sitting and supine. Pt encouraged to stay OOB today, sitting upright in recliner. Current POC remains appropriate.                                        BP Supine                      130/78 Sit                              136/66 Initial stand               119/61 Stand after 3 min      103/63 Return to sit              123/60    If plan is discharge home, recommend the following: A little help with walking and/or transfers;A little help with bathing/dressing/bathroom;Assistance with cooking/housework;Assist for transportation   Can travel by private vehicle        Equipment Recommendations  Rolling walker (2 wheels)    Recommendations for Other Services       Precautions / Restrictions Precautions Precautions: Fall;Other (comment) Recall of Precautions/Restrictions: Intact Precaution/Restrictions Comments: +orthostatics     Mobility  Bed Mobility Overal bed mobility: Needs Assistance Bed Mobility: Supine to Sit     Supine to sit: Contact  guard, HOB elevated, Used rails          Transfers Overall transfer level: Needs assistance Equipment used: Ambulation equipment used Transfers: Sit to/from Stand Sit to Stand: Min assist           General transfer comment: increased time to stabilize initial balance    Ambulation/Gait Ambulation/Gait assistance: Contact guard assist Gait Distance (Feet): 5 Feet Assistive device: 1 person hand held assist Gait Pattern/deviations: Step-through pattern       General Gait Details: amb limited to bed>chair due to symptomatic hypotension   Stairs             Wheelchair Mobility     Tilt Bed    Modified Rankin (Stroke Patients Only) Modified Rankin (Stroke Patients Only) Pre-Morbid Rankin Score: No symptoms Modified Rankin: Moderate disability     Balance Overall balance assessment: Needs assistance Sitting-balance support: No upper extremity supported, Feet supported Sitting balance-Leahy Scale: Fair     Standing balance support: Single extremity supported, No upper extremity supported, During functional activity Standing balance-Leahy Scale: Poor  Communication Communication Communication: Impaired Factors Affecting Communication: Difficulty expressing self  Cognition Arousal: Alert Behavior During Therapy: WFL for tasks assessed/performed   PT - Cognitive impairments: Initiation, Attention                         Following commands: Impaired Following commands impaired: Follows one step commands with increased time    Cueing Cueing Techniques: Verbal cues, Gestural cues  Exercises Other Exercises Other Exercises: Educated on performing LE exercises in sit: LAQ, AP and supine: HS, hip abd/add    General Comments General comments (skin integrity, edema, etc.): Orthostatic BP: supine 130/78 sit 136/66, initial stand 119/61, stand after 3 min 103/63, return to sit 123/60. Pt symptomatic with c/o  'feeling off.'      Pertinent Vitals/Pain Pain Assessment Pain Assessment: No/denies pain    Home Living                          Prior Function            PT Goals (current goals can now be found in the care plan section) Acute Rehab PT Goals Patient Stated Goal: home Progress towards PT goals: Progressing toward goals    Frequency    Min 3X/week      PT Plan      Co-evaluation              AM-PAC PT 6 Clicks Mobility   Outcome Measure  Help needed turning from your back to your side while in a flat bed without using bedrails?: A Little Help needed moving from lying on your back to sitting on the side of a flat bed without using bedrails?: A Little Help needed moving to and from a bed to a chair (including a wheelchair)?: A Little Help needed standing up from a chair using your arms (e.g., wheelchair or bedside chair)?: A Little Help needed to walk in hospital room?: A Little Help needed climbing 3-5 steps with a railing? : A Little 6 Click Score: 18    End of Session Equipment Utilized During Treatment: Gait belt Activity Tolerance: Treatment limited secondary to medical complications (Comment) (+orthostatics) Patient left: in chair;with call bell/phone within reach Nurse Communication: Mobility status;Other (comment) (BP) PT Visit Diagnosis: Unsteadiness on feet (R26.81)     Time: 9053-8989 PT Time Calculation (min) (ACUTE ONLY): 24 min  Charges:    $Gait Training: 8-22 mins $Therapeutic Activity: 8-22 mins PT General Charges $$ ACUTE PT VISIT: 1 Visit                     Sari MATSU., PT  Office # [phone]    Erven Sari Shaker 12/30/2023, 12:08 PM

## 2023-12-30 NOTE — Telephone Encounter (Signed)
 Sent pt message about refill too soon,

## 2023-12-30 NOTE — Telephone Encounter (Signed)
 Pharmacy Patient Advocate Encounter   Received notification from Onbase that prior authorization for Ozempic  8 is required/requested.   Insurance verification completed.   The patient is insured through [facility].   Per test claim: Refill too soon. PA is not needed at this time. Medication was filled 12/04/23. Next eligible fill date is 02/05/24.

## 2023-12-31 ENCOUNTER — Other Ambulatory Visit (HOSPITAL_COMMUNITY): Payer: Self-pay

## 2023-12-31 DIAGNOSIS — I609 Nontraumatic subarachnoid hemorrhage, unspecified: Secondary | ICD-10-CM | POA: Diagnosis not present

## 2023-12-31 LAB — GLUCOSE, CAPILLARY
Glucose-Capillary: 127 mg/dL — ABNORMAL HIGH (ref 70–99)
Glucose-Capillary: 134 mg/dL — ABNORMAL HIGH (ref 70–99)
Glucose-Capillary: 115 mg/dL — ABNORMAL HIGH (ref 70–99)
Glucose-Capillary: 104 mg/dL — ABNORMAL HIGH (ref 70–99)
Glucose-Capillary: 142 mg/dL — ABNORMAL HIGH (ref 70–99)

## 2023-12-31 LAB — CORTISOL: Cortisol, Plasma: 9.3 ug/dL

## 2023-12-31 MED ORDER — LACTATED RINGERS IV BOLUS
500.0000 mL | Freq: Once | INTRAVENOUS | Status: AC
  Administered 2023-12-31: 10:00:00 500 mL via INTRAVENOUS

## 2023-12-31 NOTE — Progress Notes (Addendum)
 PROGRESS NOTE    Raymond Christensen  FMW:[id number] DOB: May 26, 1940 DOA: 12/26/2023 PCP: Frann Mabel Mt, DO  Chief Complaint  Patient presents with   Code Stroke    Brief Narrative:   83 year old male with past medical history of paroxysmal Raymond Christensen-fib on Eliquis  last dose on day of presentation in the morning, CAD, heart failure with midrange ejection fraction 45% who presented to the hospital as Vandy Tsuchiya code stroke due to sudden onset expressive aphasia.   History obtained from patient, family and chart review.  Patient had Raymond Christensen syncopal episode 6 days prior to his presentation and fell and hit the floor with bruising on his head around his right eye.  He is not able to recall what exactly happened.   On presentation to the hospital, he was noted to have significant expressive aphasia.  Code stroke and sent for head CT with findings concerning for acute left hemisphere hemorrhage with subarachnoid and intra-axial component.   Neurosurgery was consulted and they recommended repeat head CT in 6 hours.  Significant Events 12/12 head CT with acute L hemisphere hemorrhage with suspected subarachnoid and intraaxial components with regional hypodense edema Admitted to the ICU  12/26/23 12/13 unchanged L sided subarachnoid hemorrhage, subdural hemorrhage, and suspected L temporal intraparenchymal hemorrhage and edema 12/13 LTM EEG - seizure noted 12/14 neurology signed off 12/14 last neurosurgery note 12/15 orthostatic positive 12/16 TRH assumed care   Assessment & Plan:   Principal Problem:   Subarachnoid hemorrhage (HCC) Active Problems:   Expressive aphasia   Seizure (HCC)  Traumatic L Temporal ICH Traumatic SAH  Subdural Hemorrhage Expressive Aphasia  Seizures Head CT at presentation 12/12 with acute L hemisphere hemorrhage with suspected subarachnoid and intraaxial components with regional hypodense edema CTA head/neck 12/12 negative for intracranial aneurysm, LVO, or CTA spot sign  in association with acute intracranial hemorrhage - atherosclerosis including moderate to severe stenosis of the L ACA A1 segment, up to moderate bilateral supraclinoid ICA stenosis - previous L carotid endarterectomy CT 12/13 unchanged L sided subarachnoid hemorrhage, subdural hemorrhage and suspected L temporal intraparenchymal hemorrhage and edema Neurosurgery saw last on 12/14 Neurology signed off 12/14 - recommending keppra  1 g BID for seizures in setting of traumatic ICH, SDH, SAH, hypertensive emergency, coagulopathy.  Seizure precautions.  Neurology follow up in 8-12 weeks.   Per [place]  DMV statutes, patients with seizures are not allowed to drive until  they have been seizure-free for six months. Use caution when using heavy equipment or power tools. Avoid working on ladders or at heights. Take showers instead of baths. Ensure the water temperature is not too high on the home water heater. Do not go swimming alone. When caring for infants or small children, sit down when holding, feeding, or changing them to minimize risk of injury to the child in the event you have Raymond Christensen seizure. Also, Maintain good sleep hygiene. Avoid alcohol.  Orthostatic Hypotension Limiting his ability to participate with therapy Continue compression stockings (these are loose, asked RN to help with more well fitting compression stockings) He felt better after IVF yesterday, trial another bolus (relatively small with his mid range EF and grade II diastolic dysfunction) Holding metoprolol  Follow cortisol, TSH  HFmrEF Diastolic Dysfunction S/p mTEER procedure  Moderate Mitral Regurgitation Currently appears euvolemic  Caution with IVF  Atrial Fibrillation  Eliquis  has been discontinued in the setting of above Metoprolol    Hypothyroidism Synthroid , cytomel   BPH Overactive Bladder Proscar  Not taking gemtesa  Depression  Anxiety Continue paxil , ativan   Will discontinue wellbutrin  at discharge due to  his seizure above  Dyslipidemia Statin, zetia  on hold      DVT prophylaxis: SCD Code Status: full Family Communication: discussed with son 12/16 over phone.  Wife 12/17 over phone. Disposition:   Status is: Inpatient Remains inpatient appropriate because: need for continued inpatient care, therapy evals   Consultants:  Neurology Neurosurgery PCCM  Procedures:  EEG IMPRESSION: This study showed one seizure without clinical signs arising from left temporal region on 12/27/2023 at 131, lasting for about 35 seconds Additionally there is cortical dysfunction arising from left temporal region likely secondary to underlying structural abnormality.    Antimicrobials:  Anti-infectives (From admission, onward)    None       Subjective: Complains of LH - denies vertigo Describes fogginess, better when lying flat, worse when upright Felt better after IVF yesterday  Objective: Vitals:   12/31/23 0500 12/31/23 0600 12/31/23 0700 12/31/23 0800  BP: 112/61 (!) 134/59 126/65   Pulse: 79 76 75   Resp: 14 16 10    Temp:    99.4 F (37.4 C)  TempSrc:    Oral  SpO2: 98% 100% 100%     Intake/Output Summary (Last 24 hours) at 12/31/2023 0919 Last data filed at 12/31/2023 9385 Gross per 24 hour  Intake 430.82 ml  Output 1700 ml  Net -1269.18 ml   There were no vitals filed for this visit.  Examination:  General: No acute distress. Cardiovascular: Heart sounds show Raymond Christensen regular rate, and rhythm.  Lungs: unlabored Neurological: Alert and oriented 3. Moves all extremities 4. Cranial nerves II through XII grossly intact. Extremities: No clubbing or cyanosis. No edema.   Data Reviewed: I have personally reviewed following labs and imaging studies  CBC: Recent Labs  Lab 12/26/23 1035 12/26/23 1039 12/29/23 0453 12/30/23 0312  WBC 8.6  --  10.5 9.9  NEUTROABS 5.9  --  6.9  --   HGB 12.4* 12.6* 12.8* 12.8*  HCT 38.4* 37.0* 39.0 38.2*  MCV 89.3  --  87.1 87.6  PLT 170   --  176 163    Basic Metabolic Panel: Recent Labs  Lab 12/26/23 1035 12/26/23 1039 12/29/23 0453 12/30/23 0312  NA 135 140 134* 133*  K 4.8 4.5 3.8 3.7  CL 101 102 103 101  CO2 26  --  22 26  GLUCOSE 113* 116* 103* 105*  BUN 13 15 16 17   CREATININE 1.00 1.10 0.83 0.79  CALCIUM  8.6*  --  8.6* 8.1*    GFR: Estimated Creatinine Clearance: 77.6 mL/min (by C-G formula based on SCr of 0.79 mg/dL).  Liver Function Tests: Recent Labs  Lab 12/26/23 1035 12/30/23 0312  AST 44* 16  ALT 21 18  ALKPHOS 54 59  BILITOT 1.5* 1.0  PROT 6.2* 5.7*  ALBUMIN  3.7 3.0*    CBG: Recent Labs  Lab 12/30/23 1530 12/30/23 1921 12/30/23 2327 12/31/23 0324 12/31/23 0720  GLUCAP 107* 100* 120* 127* 134*     Recent Results (from the past 240 hours)  MRSA Next Gen by PCR, Nasal     Status: None   Collection Time: 12/26/23  3:51 PM   Specimen: Nasal Mucosa; Nasal Swab  Result Value Ref Range Status   MRSA by PCR Next Gen NOT DETECTED NOT DETECTED Final    Comment: (NOTE) The GeneXpert MRSA Assay (FDA approved for NASAL specimens only), is one component of Raymond Christensen comprehensive MRSA colonization surveillance program. It is not intended to diagnose MRSA infection nor  to guide or monitor treatment for MRSA infections. Test performance is not FDA approved in patients less than 4 years old. Performed at [facility], [address] [place], [place] [address]          Radiology Studies: No results found.       Scheduled Meds:  Chlorhexidine  Gluconate Cloth  6 each Topical Q0600   docusate sodium   100 mg Oral BID   finasteride   5 mg Oral Daily   levETIRAcetam   1,000 mg Oral BID   levothyroxine   75 mcg Oral Daily   liothyronine   10 mcg Oral Daily   pantoprazole   40 mg Oral Daily   Or   pantoprazole  (PROTONIX ) IV  40 mg Intravenous Daily   PARoxetine   10 mg Oral Daily   prednisoLONE  Acet-Moxifloxacin    Ophthalmic QID   Continuous Infusions:  lactated ringers         LOS: 5 days    Time spent: over 30 min     Meliton Monte, MD [facility]   To contact the attending provider between 7A-7P or the covering provider during after hours 7P-7A, please log into the web site [email or web address] and access using universal Raymond Christensen password for that web site. If you do not have the password, please call the hospital operator.  12/31/2023, 9:19 AM

## 2023-12-31 NOTE — Progress Notes (Signed)
 12/30/2023 2109: Lynwood Kipper, NP, of [facility] hospitalists paged regarding patient's SBP being above goal, despite PRN hydralazine  use.    2210: Lynwood Kipper, NP, paged regarding patient's SBP being above goal, despite PRN hydralazine  use.  2224. Lynwood Kipper, NP, responds to previous pages via secure chat to this RN. This RN is able to chat back at 2239. This RN explains that patient's SBP remains above 150 despite PRN hydralazine  use, treating pain, and treating anxiety. Order placed by NP for a one time dose of hydralazine  5 mg IV.

## 2023-12-31 NOTE — Plan of Care (Signed)
°  Problem: Education: Goal: Knowledge of disease or condition will improve Outcome: Progressing Goal: Knowledge of secondary prevention will improve (MUST DOCUMENT ALL) Outcome: Progressing Goal: Knowledge of patient specific risk factors will improve (DELETE if not current risk factor) Outcome: Progressing   Problem: Spontaneous Subarachnoid Hemorrhage Tissue Perfusion: Goal: Complications of Spontaneous Subarachnoid Hemorrhage will be minimized Outcome: Progressing   Problem: Clinical Measurements: Goal: Ability to maintain clinical measurements within normal limits will improve Outcome: Progressing Goal: Will remain free from infection Outcome: Progressing Goal: Diagnostic test results will improve Outcome: Progressing Goal: Respiratory complications will improve Outcome: Progressing Goal: Cardiovascular complication will be avoided Outcome: Progressing

## 2023-12-31 NOTE — Plan of Care (Signed)

## 2023-12-31 NOTE — Progress Notes (Signed)
 Physical Therapy Treatment Patient Details Name: Raymond Christensen MRN: [id number] DOB: Jul 08, 1940 Today's Date: 12/31/2023   History of Present Illness 83 year old male presented to the hospital as a code stroke 12/12 due to sudden onset expressive aphasia. Found to have acute left hemisphere hemorrhage with subarachnoid and intra-axial component. Did have a fall 6 days PTA. PHMx: paroxysmal A-fib,  CAD, heart failure with midrange ejection fraction 45%, HTN, DM, anxiety, NSTEMI, memory change, OA, skin CA left arm, vaso vagal responses to GI issues.    PT Comments  Patient continued with orthostatic positive and limited activity tolerance.  Patient able to use rollator for seated rest in hallway though needing mod cues for safety.  He was able to sit up to EOB unaided, needing min A to stand with initial posterior bias.  PT will continue to follow.  Orthostatic VS for the past 24 hrs (Last 3 readings):  BP- Lying Pulse- Lying BP- Sitting Pulse- Sitting BP- Standing at 0 minutes Pulse- Standing at 0 minutes BP- Standing at 3 minutes Pulse- Standing at 3 minutes  12/31/23 1215 137/68 75 146/71 89 94/58 100 110/56 113       If plan is discharge home, recommend the following: A little help with walking and/or transfers;A little help with bathing/dressing/bathroom;Assistance with cooking/housework;Assist for transportation   Can travel by private Psychologist, Clinical (4 wheels)    Recommendations for Other Services       Precautions / Restrictions Precautions Precautions: Fall Precaution/Restrictions Comments: +orthostatics; TED hose     Mobility  Bed Mobility Overal bed mobility: Needs Assistance Bed Mobility: Supine to Sit       Sit to supine: Supervision, HOB elevated, Used rails        Transfers Overall transfer level: Needs assistance Equipment used: Rollator (4 wheels) Transfers: Sit to/from Stand Sit to Stand: Min assist            General transfer comment: cues for hand placement, assist for balance and anterior weight shift    Ambulation/Gait Ambulation/Gait assistance: Contact guard assist Gait Distance (Feet): 30 Feet (x 2) Assistive device: Rollator (4 wheels) Gait Pattern/deviations: Step-to pattern, Step-through pattern, Decreased stride length, Trunk flexed       General Gait Details: slow pace, cues for rollator brakes, assist to move it to a wall and lock brakes and sit in hallway for NT to get CBG   Stairs             Wheelchair Mobility     Tilt Bed    Modified Rankin (Stroke Patients Only)       Balance Overall balance assessment: Needs assistance   Sitting balance-Leahy Scale: Good     Standing balance support: Bilateral upper extremity supported Standing balance-Leahy Scale: Poor                 High Level Balance Comments: initial posterior bias            Communication Communication Communication: Impaired Factors Affecting Communication: Hearing impaired  Cognition Arousal: Alert Behavior During Therapy: Flat affect   PT - Cognitive impairments: Problem solving, Initiation, Memory, Attention                       PT - Cognition Comments: slow processing, wife reports memory issues prior to fall, sustained attention Following commands: Impaired Following commands impaired: Follows one step commands with increased time    Cueing Cueing Techniques:  Verbal cues, Gestural cues  Exercises      General Comments General comments (skin integrity, edema, etc.): wife in the room and eager for pt to improve; see flowsheet for othostatic vitals      Pertinent Vitals/Pain Pain Assessment Pain Assessment: No/denies pain    Home Living                          Prior Function            PT Goals (current goals can now be found in the care plan section) Progress towards PT goals: Progressing toward goals    Frequency    Min  3X/week      PT Plan      Co-evaluation              AM-PAC PT 6 Clicks Mobility   Outcome Measure  Help needed turning from your back to your side while in a flat bed without using bedrails?: A Little Help needed moving from lying on your back to sitting on the side of a flat bed without using bedrails?: A Little Help needed moving to and from a bed to a chair (including a wheelchair)?: A Little Help needed standing up from a chair using your arms (e.g., wheelchair or bedside chair)?: A Little Help needed to walk in hospital room?: A Little Help needed climbing 3-5 steps with a railing? : A Little 6 Click Score: 18    End of Session Equipment Utilized During Treatment: Gait belt Activity Tolerance: Treatment limited secondary to medical complications (Comment) (orthostatic) Patient left: in chair;with call bell/phone within reach   PT Visit Diagnosis: Difficulty in walking, not elsewhere classified (R26.2);Other abnormalities of gait and mobility (R26.89)     Time: 8859-8784 PT Time Calculation (min) (ACUTE ONLY): 35 min  Charges:    $Gait Training: 8-22 mins $Therapeutic Activity: 8-22 mins PT General Charges $$ ACUTE PT VISIT: 1 Visit                     Micheline Portal, PT Acute Rehabilitation Services [phone] 12/31/2023    [name] 12/31/2023, 12:25 PM

## 2024-01-01 DIAGNOSIS — I609 Nontraumatic subarachnoid hemorrhage, unspecified: Secondary | ICD-10-CM | POA: Diagnosis not present

## 2024-01-01 LAB — CORTISOL: Cortisol, Plasma: 12.2 ug/dL

## 2024-01-01 LAB — CBC WITH DIFFERENTIAL/PLATELET
Abs Immature Granulocytes: 0.05 K/uL (ref 0.00–0.07)
Basophils Absolute: 0 K/uL (ref 0.0–0.1)
Basophils Relative: 0 %
Eosinophils Absolute: 0.1 K/uL (ref 0.0–0.5)
Eosinophils Relative: 1 %
HCT: 38.7 % — ABNORMAL LOW (ref 39.0–52.0)
Hemoglobin: 13.1 g/dL (ref 13.0–17.0)
Immature Granulocytes: 0 %
Lymphocytes Relative: 19 %
Lymphs Abs: 2.3 K/uL (ref 0.7–4.0)
MCH: 29.4 pg (ref 26.0–34.0)
MCHC: 33.9 g/dL (ref 30.0–36.0)
MCV: 87 fL (ref 80.0–100.0)
Monocytes Absolute: 1.2 K/uL — ABNORMAL HIGH (ref 0.1–1.0)
Monocytes Relative: 10 %
Neutro Abs: 8.2 K/uL — ABNORMAL HIGH (ref 1.7–7.7)
Neutrophils Relative %: 70 %
Platelets: 186 K/uL (ref 150–400)
RBC: 4.45 MIL/uL (ref 4.22–5.81)
RDW: 13.5 % (ref 11.5–15.5)
WBC: 11.9 K/uL — ABNORMAL HIGH (ref 4.0–10.5)
nRBC: 0 % (ref 0.0–0.2)

## 2024-01-01 LAB — COMPREHENSIVE METABOLIC PANEL WITH GFR
ALT: 16 U/L (ref 0–44)
AST: 18 U/L (ref 15–41)
Albumin: 3.5 g/dL (ref 3.5–5.0)
Alkaline Phosphatase: 75 U/L (ref 38–126)
Anion gap: 9 (ref 5–15)
BUN: 18 mg/dL (ref 8–23)
CO2: 23 mmol/L (ref 22–32)
Calcium: 8.7 mg/dL — ABNORMAL LOW (ref 8.9–10.3)
Chloride: 100 mmol/L (ref 98–111)
Creatinine, Ser: 0.76 mg/dL (ref 0.61–1.24)
GFR, Estimated: >60 mL/min (ref >60)
Glucose, Bld: 104 mg/dL — ABNORMAL HIGH (ref 70–99)
Potassium: 3.9 mmol/L (ref 3.5–5.1)
Sodium: 132 mmol/L — ABNORMAL LOW (ref 135–145)
Total Bilirubin: 0.8 mg/dL (ref 0.0–1.2)
Total Protein: 5.9 g/dL — ABNORMAL LOW (ref 6.5–8.1)

## 2024-01-01 LAB — GLUCOSE, CAPILLARY
Glucose-Capillary: 105 mg/dL — ABNORMAL HIGH (ref 70–99)
Glucose-Capillary: 114 mg/dL — ABNORMAL HIGH (ref 70–99)
Glucose-Capillary: 116 mg/dL — ABNORMAL HIGH (ref 70–99)
Glucose-Capillary: 118 mg/dL — ABNORMAL HIGH (ref 70–99)
Glucose-Capillary: 121 mg/dL — ABNORMAL HIGH (ref 70–99)
Glucose-Capillary: 133 mg/dL — ABNORMAL HIGH (ref 70–99)

## 2024-01-01 LAB — PHOSPHORUS: Phosphorus: 3 mg/dL (ref 2.5–4.6)

## 2024-01-01 LAB — MAGNESIUM: Magnesium: 1.9 mg/dL (ref 1.7–2.4)

## 2024-01-01 LAB — TSH: TSH: 3.85 u[IU]/mL (ref 0.350–4.500)

## 2024-01-01 MED ORDER — METOPROLOL TARTRATE 12.5 MG HALF TABLET
12.5000 mg | ORAL_TABLET | Freq: Two times a day (BID) | ORAL | Status: DC
  Administered 2024-01-01 – 2024-01-04 (×7): 12.5 mg via ORAL
  Filled 2024-01-01 (×7): qty 1

## 2024-01-01 MED ORDER — METOPROLOL TARTRATE 5 MG/5ML IV SOLN
2.5000 mg | INTRAVENOUS | Status: DC | PRN
  Administered 2024-01-01: 13:00:00 2.5 mg via INTRAVENOUS
  Filled 2024-01-01: qty 5

## 2024-01-01 MED ORDER — POLYETHYLENE GLYCOL 3350 17 G PO PACK
17.0000 g | PACK | Freq: Two times a day (BID) | ORAL | Status: DC
  Administered 2024-01-01 – 2024-01-12 (×12): 17 g via ORAL
  Filled 2024-01-01 (×16): qty 1

## 2024-01-01 NOTE — TOC Initial Note (Signed)
 Transition of Care [facility]) - Initial/Assessment Note    Patient Details  Name: Raymond Christensen MRN: [id number] Date of Birth: 13-Jul-1940  [facility]) CM/SW Contact:    Andrez JULIANNA George, RN Phone Number: 01/01/2024, 1:47 PM  Clinical Narrative:                 83 year old male with past medical history of paroxysmal A-fib on Eliquis  last dose on day of presentation in the morning, CAD, heart failure with midrange ejection fraction 45% who presented to the hospital as a code stroke due to sudden onset expressive aphasia.   Pt is from [facility] with his spouse.  No DME at home.  Pt manages his own medications.  Pt does the driving but says he has friends that can assist.   Home therapy recommended and arranged with [facility] through [facility]. Information faxed to [facility] with [facility]: [phone] Rollator ordered through [facility] and will be delivered to his room.   IP Care management following.  Expected Discharge Plan: Home w Home Health Services Barriers to Discharge: Continued Medical Work up   Patient Goals and CMS Choice   CMS Medicare.gov Compare Post Acute Care list provided to:: Patient Choice offered to / list presented to : Patient      Expected Discharge Plan and Services   Discharge Planning Services: CM Consult Post Acute Care Choice: Home Health, Durable Medical Equipment Living arrangements for the past 2 months: Apartment                 DME Arranged: Walker rolling with seat DME Agency: [employer] Date DME Agency Contacted: 01/01/24   Representative spoke with at DME Agency: [place] HH Arranged: PT, OT HH Agency:  [employer] [employer]) Date HH Agency Contacted: 01/01/24   Representative spoke with at [facility] Agency: [place]  Prior Living Arrangements/Services Living arrangements for the past 2 months: Apartment Lives with:: Spouse Patient language and need for interpreter reviewed:: Yes Do you feel safe going back to the place where  you live?: Yes        Care giver support system in place?: Yes (comment)   Criminal Activity/Legal Involvement Pertinent to Current Situation/Hospitalization: No - Comment as needed  Activities of Daily Living   ADL Screening (condition at time of admission) Independently performs ADLs?: Yes (appropriate for developmental age) Is the patient deaf or have difficulty hearing?: Yes Does the patient have difficulty seeing, even when wearing glasses/contacts?: No Does the patient have difficulty concentrating, remembering, or making decisions?: No  Permission Sought/Granted                  Emotional Assessment Appearance:: Appears stated age Attitude/Demeanor/Rapport: Engaged Affect (typically observed): Accepting Orientation: : Oriented to Self, Oriented to Place, Oriented to  Time, Oriented to Situation   Psych Involvement: No (comment)  Admission diagnosis:  Subarachnoid hemorrhage (HCC) [I60.9] Expressive aphasia [R47.01] Traumatic subarachnoid hemorrhage with unknown loss of consciousness status, initial encounter (HCC) [S06.6XAA] Patient Active Problem List   Diagnosis Date Noted   Expressive aphasia 12/28/2023   Seizure (HCC) 12/28/2023   Subarachnoid hemorrhage (HCC) 12/26/2023   GAD (generalized anxiety disorder) 01/10/2023   Pain in left wrist 11/20/2022   S/P CABG (coronary artery bypass graft) 06/27/2022   Hypokalemia 06/27/2022   NSTEMI (non-ST elevated myocardial infarction) (HCC) 06/26/2022   PVC's (premature ventricular contractions) 05/31/2022   Lipoma of right upper extremity 03/02/2021   Trigger little finger of left hand 09/29/2020   Secondary hypercoagulable state  02/29/2020   Depression, major, single episode, moderate (HCC) 02/28/2020   Nonrheumatic tricuspid valve regurgitation    S/P mitral valve clip implantation 12/23/2019   Mitral valve disease 12/23/2019   Nonrheumatic mitral valve regurgitation    Persistent atrial fibrillation (HCC)     Chronic HFrEF    Acute idiopathic gout involving toe of left foot    Thrombocytopenia 09/08/2019   CAD (coronary artery disease)    Type 2 diabetes mellitus in patient with obesity (HCC) 06/22/2019   Major depressive disorder    Obstructive sleep apnea on CPAP    Hypothyroidism 10/15/2017   Subclavian artery stenosis, right 06/15/2017   Severe obesity (BMI 35.0-39.9) with comorbidity (HCC) 02/26/2017   Essential hypertension 02/26/2017   Memory change 01/10/2017   Urge incontinence of urine 01/10/2017   Lightheaded 06/25/2016   AK (actinic keratosis) 01/03/2016   H/O syncope 03/06/2014   Occlusion and stenosis of left carotid artery 01/11/2014   Routine history and physical examination of adult 11/08/2013   History of repair of hip joint 04/20/2013   Hyperlipidemia 04/20/2013   Presence of artificial hip joint 04/20/2013   Vitamin D  deficiency 06/18/2010   Concussion 01/24/2010   Elevated prostate specific antigen (PSA) 01/01/2007   Benign prostatic hyperplasia without urinary obstruction 04/22/2006   Osteoarthrosis 04/22/2006   Dyslipidemia 04/02/2006   PCP:  Frann Mabel Mt, DO Pharmacy:   [facility] - [place], [place] - [phone] [address] [place] [address] Phone: [phone] Fax: [phone]  [facility] [id number] - [address], [place] - [address] [facility] [address] [facility] [address] [place] [address] Phone: [phone] Fax: [phone]     Social Drivers of Health (SDOH) Social History: SDOH Screenings   Food Insecurity: No Food Insecurity (12/31/2023)  Housing: Low Risk (12/31/2023)  Transportation Needs: No Transportation Needs (12/31/2023)  Utilities: Not At Risk (12/31/2023)  Alcohol Screen: Low Risk (07/31/2022)  Depression (PHQ2-9): Low Risk (12/22/2023)  Financial Resource Strain: Low Risk (07/31/2022)  Physical Activity: Inactive (07/31/2022)  Social Connections: Unknown (12/31/2023)   Stress: No Stress Concern Present (07/31/2022)  Tobacco Use: Medium Risk (12/26/2023)  Health Literacy: Adequate Health Literacy (07/31/2022)   SDOH Interventions:     Readmission Risk Interventions     No data to display

## 2024-01-01 NOTE — Progress Notes (Signed)
 Occupational Therapy Treatment Patient Details Name: Llewyn Heap MRN: [id number] DOB: [birth year] Today's Date: 01/01/2024   History of present illness 83 year old male presented to the hospital as a code stroke 12/12 due to sudden onset expressive aphasia. Found to have acute left hemisphere hemorrhage with subarachnoid and intra-axial component. Did have a fall 6 days PTA. PHMx: paroxysmal A-fib,  CAD, heart failure with midrange ejection fraction 45%, HTN, DM, anxiety, NSTEMI, memory change, OA, skin CA left arm, vaso vagal responses to GI issues.   OT comments  Pt in bed upon therapy arrival and agreeable to participate in OT treatment session. Session focused on bed mobility and functional transfers. Pt continues to present with mild posterior bias initially when standing although with assist is able to correct before completing transfer. Pt continues to be appropriate for follow up home health OT services at discharge.       If plan is discharge home, recommend the following:  A little help with walking and/or transfers;A little help with bathing/dressing/bathroom;Assist for transportation;Help with stairs or ramp for entrance   Equipment Recommendations  None recommended by OT       Precautions / Restrictions Precautions Precautions: Fall Recall of Precautions/Restrictions: Intact Precaution/Restrictions Comments: +orthostatics; TED hose Restrictions Weight Bearing Restrictions Per Provider Order: No       Mobility Bed Mobility Overal bed mobility: Needs Assistance Bed Mobility: Supine to Sit     Supine to sit: Supervision, HOB elevated, Used rails     General bed mobility comments: increased time to complete with VC for technique as needed. Reports a little dizziness sitting EOB    Transfers Overall transfer level: Needs assistance Equipment used: Rolling walker (2 wheels) Transfers: Sit to/from Stand, Bed to chair/wheelchair/BSC Sit to Stand: Contact guard  assist     Step pivot transfers: Contact guard assist     General transfer comment: cues for hand placement, assist for balance and anterior weight shift     Balance Overall balance assessment: Needs assistance Sitting-balance support: No upper extremity supported, Feet supported Sitting balance-Leahy Scale: Good     Standing balance support: Bilateral upper extremity supported, Reliant on assistive device for balance, During functional activity Standing balance-Leahy Scale: Poor            ADL either performed or assessed with clinical judgement   ADL        Upper Body Dressing : Set up;Sitting Upper Body Dressing Details (indicate cue type and reason): new hospital gown                   Communication Communication Communication: Impaired Factors Affecting Communication: Hearing impaired;Difficulty expressing self   Cognition Arousal: Alert Behavior During Therapy: Flat affect Cognition: No apparent impairments     Following commands: Impaired Following commands impaired: Follows one step commands with increased time      Cueing   Cueing Techniques: Verbal cues, Gestural cues        General Comments wife present during session and supportive    Pertinent Vitals/ Pain       Pain Assessment Pain Assessment: No/denies pain         Frequency  Min 2X/week        Progress Toward Goals  OT Goals(current goals can now be found in the care plan section)  Progress towards OT goals: Progressing toward goals      AM-PAC OT 6 Clicks Daily Activity     Outcome Measure   Help from another person eating meals?:  None Help from another person taking care of personal grooming?: A Little Help from another person toileting, which includes using toliet, bedpan, or urinal?: A Little Help from another person bathing (including washing, rinsing, drying)?: A Little Help from another person to put on and taking off regular upper body clothing?: A  Little Help from another person to put on and taking off regular lower body clothing?: A Little 6 Click Score: 19    End of Session Equipment Utilized During Treatment: Rolling walker (2 wheels)  OT Visit Diagnosis: Unsteadiness on feet (R26.81);Other abnormalities of gait and mobility (R26.89)   Activity Tolerance Patient tolerated treatment well   Patient Left in chair;with call bell/phone within reach;with chair alarm set;with family/visitor present           Time: 8867-8843 OT Time Calculation (min): 24 min  Charges: OT General Charges $OT Visit: 1 Visit OT Treatments $Self Care/Home Management : 23-37 mins  Leita Howell, OTR/L,CBIS  Supplemental OT - [facility] and [facility] Secure Chat Preferred    Ory Elting, Leita BIRCH 01/01/2024, 3:26 PM

## 2024-01-01 NOTE — Plan of Care (Signed)
  Problem: Education: Goal: Knowledge of disease or condition will improve Outcome: Progressing Goal: Knowledge of secondary prevention will improve (MUST DOCUMENT ALL) Outcome: Progressing Goal: Knowledge of patient specific risk factors will improve (DELETE if not current risk factor) Outcome: Progressing   Problem: Spontaneous Subarachnoid Hemorrhage Tissue Perfusion: Goal: Complications of Spontaneous Subarachnoid Hemorrhage will be minimized Outcome: Progressing   Problem: Coping: Goal: Will verbalize positive feelings about self Outcome: Progressing Goal: Will identify appropriate support needs Outcome: Progressing   Problem: Health Behavior/Discharge Planning: Goal: Ability to manage health-related needs will improve Outcome: Progressing Goal: Goals will be collaboratively established with patient/family Outcome: Progressing   Problem: Self-Care: Goal: Ability to participate in self-care as condition permits will improve Outcome: Progressing Goal: Verbalization of feelings and concerns over difficulty with self-care will improve Outcome: Progressing Goal: Ability to communicate needs accurately will improve Outcome: Progressing   Problem: Nutrition: Goal: Risk of aspiration will decrease Outcome: Progressing Goal: Dietary intake will improve Outcome: Progressing   Problem: Education: Goal: Knowledge of General Education information will improve Description: Including pain rating scale, medication(s)/side effects and non-pharmacologic comfort measures Outcome: Progressing   Problem: Health Behavior/Discharge Planning: Goal: Ability to manage health-related needs will improve Outcome: Progressing   Problem: Clinical Measurements: Goal: Ability to maintain clinical measurements within normal limits will improve Outcome: Progressing Goal: Will remain free from infection Outcome: Progressing Goal: Diagnostic test results will improve Outcome: Progressing Goal:  Respiratory complications will improve Outcome: Progressing Goal: Cardiovascular complication will be avoided Outcome: Progressing   Problem: Activity: Goal: Risk for activity intolerance will decrease Outcome: Progressing   Problem: Nutrition: Goal: Adequate nutrition will be maintained Outcome: Progressing   Problem: Coping: Goal: Level of anxiety will decrease Outcome: Progressing   Problem: Elimination: Goal: Will not experience complications related to bowel motility Outcome: Progressing Goal: Will not experience complications related to urinary retention Outcome: Progressing   Problem: Pain Managment: Goal: General experience of comfort will improve and/or be controlled Outcome: Progressing   Problem: Safety: Goal: Ability to remain free from injury will improve Outcome: Progressing   Problem: Skin Integrity: Goal: Risk for impaired skin integrity will decrease Outcome: Progressing

## 2024-01-01 NOTE — Care Management Important Message (Signed)
 Important Message  Patient Details  Name: Raymond Christensen MRN: [id number] Date of Birth: [birth year]   Important Message Given:  Yes - Medicare IM     Claretta Deed 01/01/2024, 3:04 PM

## 2024-01-01 NOTE — Progress Notes (Signed)
°   01/01/24 1157  Assess: MEWS Score  Temp 97.9 F (36.6 C)  BP 101/67  MAP (mmHg) 79  Pulse Rate (!) 122  Resp 20  SpO2 97 %  O2 Device Room Air  Assess: MEWS Score  MEWS Temp 0  MEWS Systolic 0  MEWS Pulse 2  MEWS RR 0  MEWS LOC 0  MEWS Score 2  MEWS Score Color Yellow  Assess: if the MEWS score is Yellow or Red  Were vital signs accurate and taken at a resting state? No, vital signs rechecked  Does the patient meet 2 or more of the SIRS criteria? No  MEWS guidelines implemented  Yes, yellow  Treat  MEWS Interventions Considered administering scheduled or prn medications/treatments as ordered  Take Vital Signs  Increase Vital Sign Frequency  Yellow: Q2hr x1, continue Q4hrs until patient remains green for 12hrs  Escalate  MEWS: Escalate Yellow: Discuss with charge nurse and consider notifying provider and/or RRT  Provider Notification  Provider Name/Title Dr. Perri  Date Provider Notified 01/01/24  Time Provider Notified 1201  Method of Notification Page  Notification Reason Other (Comment) (got a call from CCMD and HR was 145. Had ventricular bigeminy too. At the same time Pt was lifted from bed to chair and after it was settle to 92b/m. Informed to MD)  Provider response Other (Comment) (on secure chat, said to monitor the client closely)  Date of Provider Response 01/01/24  Time of Provider Response 1203  Assess: SIRS CRITERIA  SIRS Temperature  0  SIRS Respirations  0  SIRS Pulse 1  SIRS WBC 0  SIRS Score Sum  1

## 2024-01-01 NOTE — Plan of Care (Signed)
°  Problem: Education: Goal: Knowledge of disease or condition will improve Outcome: Progressing   Problem: Education: Goal: Knowledge of secondary prevention will improve (MUST DOCUMENT ALL) Outcome: Progressing   Problem: Education: Goal: Knowledge of patient specific risk factors will improve (DELETE if not current risk factor) Outcome: Progressing   Problem: Spontaneous Subarachnoid Hemorrhage Tissue Perfusion: Goal: Complications of Spontaneous Subarachnoid Hemorrhage will be minimized Outcome: Progressing   Problem: Coping: Goal: Will verbalize positive feelings about self Outcome: Progressing   Problem: Health Behavior/Discharge Planning: Goal: Ability to manage health-related needs will improve Outcome: Progressing   Problem: Nutrition: Goal: Dietary intake will improve Outcome: Progressing   Problem: Clinical Measurements: Goal: Ability to maintain clinical measurements within normal limits will improve Outcome: Progressing   Problem: Skin Integrity: Goal: Risk for impaired skin integrity will decrease Outcome: Progressing   Problem: Safety: Goal: Ability to remain free from injury will improve Outcome: Progressing

## 2024-01-01 NOTE — Progress Notes (Signed)
 PROGRESS NOTE    Raymond Christensen  FMW:[id number] DOB: 1940-09-11 DOA: 12/26/2023 PCP: Frann Mabel Mt, DO  Chief Complaint  Patient presents with   Code Stroke    Brief Narrative:   83 year old male with past medical history of paroxysmal Raymond Christensen-fib on Eliquis  last dose on day of presentation in the morning, CAD, heart failure with midrange ejection fraction 45% who presented to the hospital as Raymond Christensen code stroke due to sudden onset expressive aphasia.   History obtained from patient, family and chart review.  Patient had [name] syncopal episode 6 days prior to his presentation and fell and hit the floor with bruising on his head around his right eye.  He is not able to recall what exactly happened.   On presentation to the hospital, he was noted to have significant expressive aphasia.  Code stroke and sent for head CT with findings concerning for acute left hemisphere hemorrhage with subarachnoid and intra-axial component.   Neurosurgery was consulted and they recommended repeat head CT in 6 hours.  Significant Events 12/12 head CT with acute L hemisphere hemorrhage with suspected subarachnoid and intraaxial components with regional hypodense edema Admitted to the ICU  12/26/23 12/13 unchanged L sided subarachnoid hemorrhage, subdural hemorrhage, and suspected L temporal intraparenchymal hemorrhage and edema 12/13 LTM EEG - seizure noted 12/14 neurology signed off 12/14 last neurosurgery note 12/15 orthostatic positive 12/16 TRH assumed care   Assessment & Plan:   Principal Problem:   Subarachnoid hemorrhage (HCC) Active Problems:   Expressive aphasia   Seizure (HCC)  Traumatic L Temporal ICH Traumatic SAH  Subdural Hemorrhage Expressive Aphasia  Seizures Head CT at presentation 12/12 with acute L hemisphere hemorrhage with suspected subarachnoid and intraaxial components with regional hypodense edema CTA head/neck 12/12 negative for intracranial aneurysm, LVO, or CTA spot sign  in association with acute intracranial hemorrhage - atherosclerosis including moderate to severe stenosis of the L ACA A1 segment, up to moderate bilateral supraclinoid ICA stenosis - previous L carotid endarterectomy CT 12/13 unchanged L sided subarachnoid hemorrhage, subdural hemorrhage and suspected L temporal intraparenchymal hemorrhage and edema Neurosurgery saw last on 12/14 Neurology signed off 12/14 - recommending keppra  1 g BID for seizures in setting of traumatic ICH, SDH, SAH, hypertensive emergency, coagulopathy.  Seizure precautions.  Neurology follow up in 8-12 weeks.   Per [place]  DMV statutes, patients with seizures are not allowed to drive until  they have been seizure-free for six months. Use caution when using heavy equipment or power tools. Avoid working on ladders or at heights. Take showers instead of baths. Ensure the water temperature is not too high on the home water heater. Do not go swimming alone. When caring for infants or small children, sit down when holding, feeding, or changing them to minimize risk of injury to the child in the event you have Raymond Christensen seizure. Also, Maintain good sleep hygiene. Avoid alcohol.  Atrial Fibrillation with RVR Eliquis  has been discontinued in the setting of above Rates improved with IV metop, follow with oral metop   Orthostatic Hypotension Continue compression stockings, abdominal binder Metop low dose Holding metoprolol  Follow cortisol 12.2 (unlikely adrenal insufficiency), TSH (wnl)  HFmrEF Diastolic Dysfunction S/p mTEER procedure  Moderate Mitral Regurgitation Currently appears euvolemic  Caution with IVF  Hypothyroidism Synthroid , cytomel   BPH Overactive Bladder Proscar  Not taking gemtesa  Depression  Anxiety Continue paxil , ativan   Will discontinue wellbutrin  at discharge due to his seizure above  Dyslipidemia Statin, zetia  on hold  DVT prophylaxis: SCD Code Status: full Family Communication: wife  12/18 at bedside Disposition:   Status is: Inpatient Remains inpatient appropriate because: need for continued inpatient care, therapy evals   Consultants:  Neurology Neurosurgery PCCM  Procedures:  EEG IMPRESSION: This study showed one seizure without clinical signs arising from left temporal region on 12/27/2023 at 131, lasting for about 35 seconds Additionally there is cortical dysfunction arising from left temporal region likely secondary to underlying structural abnormality.    Antimicrobials:  Anti-infectives (From admission, onward)    None       Subjective: Denies CP or SOB Notes similar continued fatigue Wife and Lesley Galentine friend at bedside  Objective: Vitals:   01/01/24 1157 01/01/24 1239 01/01/24 1406 01/01/24 1619  BP: 101/67 122/75 106/61 (!) 140/64  Pulse: (!) 122 (!) 142 82 68  Resp: 20 18  18   Temp: 97.9 F (36.6 C) 97.7 F (36.5 C) 97.8 F (36.6 C) 98 F (36.7 C)  TempSrc: Oral Oral Oral   SpO2: 97%  98% 99%  Height:        Intake/Output Summary (Last 24 hours) at 01/01/2024 1714 Last data filed at 01/01/2024 0427 Gross per 24 hour  Intake --  Output 1100 ml  Net -1100 ml   There were no vitals filed for this visit.  Examination:  General: No acute distress. Cardiovascular: irregularly irregular, tachy in the 140's-150's Lungs: unlabored Neurological: Alert and oriented 3. Moves all extremities 4. Cranial nerves II through XII grossly intact. Extremities: No clubbing or cyanosis. No edema.   Data Reviewed: I have personally reviewed following labs and imaging studies  CBC: Recent Labs  Lab 12/26/23 1035 12/26/23 1039 12/29/23 0453 12/30/23 0312 01/01/24 0307  WBC 8.6  --  10.5 9.9 11.9*  NEUTROABS 5.9  --  6.9  --  8.2*  HGB 12.4* 12.6* 12.8* 12.8* 13.1  HCT 38.4* 37.0* 39.0 38.2* 38.7*  MCV 89.3  --  87.1 87.6 87.0  PLT 170  --  176 163 186    Basic Metabolic Panel: Recent Labs  Lab 12/26/23 1035 12/26/23 1039  12/29/23 0453 12/30/23 0312 01/01/24 0307  NA 135 140 134* 133* 132*  K 4.8 4.5 3.8 3.7 3.9  CL 101 102 103 101 100  CO2 26  --  22 26 23   GLUCOSE 113* 116* 103* 105* 104*  BUN 13 15 16 17 18   CREATININE 1.00 1.10 0.83 0.79 0.76  CALCIUM  8.6*  --  8.6* 8.1* 8.7*  MG  --   --   --   --  1.9  PHOS  --   --   --   --  3.0    GFR: Estimated Creatinine Clearance: 77.6 mL/min (by C-G formula based on SCr of 0.76 mg/dL).  Liver Function Tests: Recent Labs  Lab 12/26/23 1035 12/30/23 0312 01/01/24 0307  AST 44* 16 18  ALT 21 18 16   ALKPHOS 54 59 75  BILITOT 1.5* 1.0 0.8  PROT 6.2* 5.7* 5.9*  ALBUMIN  3.7 3.0* 3.5    CBG: Recent Labs  Lab 12/31/23 2007 01/01/24 0018 01/01/24 0342 01/01/24 1156 01/01/24 1625  GLUCAP 142* 105* 114* 116* 133*     Recent Results (from the past 240 hours)  MRSA Next Gen by PCR, Nasal     Status: None   Collection Time: 12/26/23  3:51 PM   Specimen: Nasal Mucosa; Nasal Swab  Result Value Ref Range Status   MRSA by PCR Next Gen NOT DETECTED NOT DETECTED  Final    Comment: (NOTE) The GeneXpert MRSA Assay (FDA approved for NASAL specimens only), is one component of [facility]rie Mor[facility] comprehensive MRSA colonization surveillance program. It is not intended to diagnose MRSA infection nor to guide or monitor treatment for MRSA infections. Test performance is not FDA approved in patients less than 73 years old. Performed at [facility], [address], [place], [place] [address]          Radiology Studies: No results found.       Scheduled Meds:  Chlorhexidine  Gluconate Cloth  6 each Topical Q0600   docusate sodium   100 mg Oral BID   finasteride   5 mg Oral Daily   levETIRAcetam   1,000 mg Oral BID   levothyroxine   75 mcg Oral Daily   liothyronine   10 mcg Oral Daily   metoprolol  tartrate  12.5 mg Oral BID   pantoprazole   40 mg Oral Daily   PARoxetine   10 mg Oral Daily   polyethylene glycol  17 g Oral BID   prednisoLONE   Acet-Moxifloxacin    Ophthalmic QID   Continuous Infusions:     LOS: 6 days    Time spent: over 30 min     Meliton Monte, MD [facility]   To contact the attending provider between 7A-7P or the covering provider during after hours 7P-7A, please log into the web site [email or web address] and access using universal [place] password for that web site. If you do not have the password, please call the hospital operator.  01/01/2024, 5:14 PM

## 2024-01-02 ENCOUNTER — Inpatient Hospital Stay (HOSPITAL_COMMUNITY)

## 2024-01-02 DIAGNOSIS — I609 Nontraumatic subarachnoid hemorrhage, unspecified: Secondary | ICD-10-CM | POA: Diagnosis not present

## 2024-01-02 LAB — COMPREHENSIVE METABOLIC PANEL WITH GFR
ALT: 19 U/L (ref 0–44)
AST: 18 U/L (ref 15–41)
Albumin: 3.5 g/dL (ref 3.5–5.0)
Alkaline Phosphatase: 74 U/L (ref 38–126)
Anion gap: 9 (ref 5–15)
BUN: 19 mg/dL (ref 8–23)
CO2: 24 mmol/L (ref 22–32)
Calcium: 8.7 mg/dL — ABNORMAL LOW (ref 8.9–10.3)
Chloride: 99 mmol/L (ref 98–111)
Creatinine, Ser: 0.75 mg/dL (ref 0.61–1.24)
GFR, Estimated: >60 mL/min
Glucose, Bld: 102 mg/dL — ABNORMAL HIGH (ref 70–99)
Potassium: 4 mmol/L (ref 3.5–5.1)
Sodium: 131 mmol/L — ABNORMAL LOW (ref 135–145)
Total Bilirubin: 0.6 mg/dL (ref 0.0–1.2)
Total Protein: 6 g/dL — ABNORMAL LOW (ref 6.5–8.1)

## 2024-01-02 LAB — GLUCOSE, CAPILLARY
Glucose-Capillary: 117 mg/dL — ABNORMAL HIGH (ref 70–99)
Glucose-Capillary: 119 mg/dL — ABNORMAL HIGH (ref 70–99)
Glucose-Capillary: 177 mg/dL — ABNORMAL HIGH (ref 70–99)
Glucose-Capillary: 142 mg/dL — ABNORMAL HIGH (ref 70–99)
Glucose-Capillary: 142 mg/dL — ABNORMAL HIGH (ref 70–99)
Glucose-Capillary: 124 mg/dL — ABNORMAL HIGH (ref 70–99)

## 2024-01-02 LAB — CBC WITH DIFFERENTIAL/PLATELET
Abs Immature Granulocytes: 0.05 K/uL (ref 0.00–0.07)
Basophils Absolute: 0 K/uL (ref 0.0–0.1)
Basophils Relative: 0 %
Eosinophils Absolute: 0.1 K/uL (ref 0.0–0.5)
Eosinophils Relative: 1 %
HCT: 36.9 % — ABNORMAL LOW (ref 39.0–52.0)
Hemoglobin: 12.3 g/dL — ABNORMAL LOW (ref 13.0–17.0)
Immature Granulocytes: 0 %
Lymphocytes Relative: 21 %
Lymphs Abs: 2.5 K/uL (ref 0.7–4.0)
MCH: 28.8 pg (ref 26.0–34.0)
MCHC: 33.3 g/dL (ref 30.0–36.0)
MCV: 86.4 fL (ref 80.0–100.0)
Monocytes Absolute: 1.2 K/uL — ABNORMAL HIGH (ref 0.1–1.0)
Monocytes Relative: 10 %
Neutro Abs: 7.9 K/uL — ABNORMAL HIGH (ref 1.7–7.7)
Neutrophils Relative %: 68 %
Platelets: 189 K/uL (ref 150–400)
RBC: 4.27 MIL/uL (ref 4.22–5.81)
RDW: 13.6 % (ref 11.5–15.5)
WBC: 11.8 K/uL — ABNORMAL HIGH (ref 4.0–10.5)
nRBC: 0 % (ref 0.0–0.2)

## 2024-01-02 LAB — MAGNESIUM: Magnesium: 1.9 mg/dL (ref 1.7–2.4)

## 2024-01-02 LAB — PHOSPHORUS: Phosphorus: 2.8 mg/dL (ref 2.5–4.6)

## 2024-01-02 MED ORDER — LACTATED RINGERS IV BOLUS
500.0000 mL | Freq: Once | INTRAVENOUS | Status: AC
  Administered 2024-01-02: 500 mL via INTRAVENOUS

## 2024-01-02 NOTE — TOC Progression Note (Signed)
 Transition of Care [facility]) - Progression Note    Patient Details  Name: Raymond Christensen MRN: [id number] Date of Birth: November 29, 1940  [facility] of [facility]) CM/SW Contact  Andrez JULIANNA George, RN Phone Number: 01/02/2024, 3:00 PM  Clinical Narrative:     Pt has Choice Care Navigator outside the hospital setting. Family is asking she be contacted for d/c planning: Alfonso Holms [phone]. Pt with changes today and needs repeat MRI. Will need therapy to re-see with new changes.  IP Care management following.  Expected Discharge Plan: Home w Home Health Services Barriers to Discharge: Continued Medical Work up               Expected Discharge Plan and Services   Discharge Planning Services: CM Consult Post Acute Care Choice: Home Health, Durable Medical Equipment Living arrangements for the past 2 months: Apartment                 DME Arranged: Walker rolling with seat DME Agency: [employer] Date DME Agency Contacted: 01/01/24   Representative spoke with at DME Agency: [place] HH Arranged: PT, OT HH Agency:  [employer]) Date HH Agency Contacted: 01/01/24   Representative spoke with at [facility] Agency: [place]   Social Drivers of Health (SDOH) Interventions SDOH Screenings   Food Insecurity: No Food Insecurity (12/31/2023)  Housing: Low Risk (12/31/2023)  Transportation Needs: No Transportation Needs (12/31/2023)  Utilities: Not At Risk (12/31/2023)  Alcohol Screen: Low Risk (07/31/2022)  Depression (PHQ2-9): Low Risk (12/22/2023)  Financial Resource Strain: Low Risk (07/31/2022)  Physical Activity: Inactive (07/31/2022)  Social Connections: Unknown (12/31/2023)  Stress: No Stress Concern Present (07/31/2022)  Tobacco Use: Medium Risk (12/26/2023)  Health Literacy: Adequate Health Literacy (07/31/2022)    Readmission Risk Interventions     No data to display

## 2024-01-02 NOTE — Significant Event (Signed)
 This RN notified by physical therapy that while working with pt he seemed to develop new R sided facial droop along with dysarthria and aphasia. This RN into room to assess pt and found pt with new R sided facial droop and dysarthria and some difficulty with word finding, though improving during assessment. Set of vitals obtained, which were WNL and blood glucose collected with CBG of 141. NIHSS 3 at time of event. Hospitalist Dr. Perri immediately notified of event and provider up to beside to assess pt. Pt with no other neurological deficits noted. Full strength and sensation in all extremities and PERRLA. MRI of brain ordered. Will continue to closely monitor

## 2024-01-02 NOTE — Progress Notes (Signed)
 " PROGRESS NOTE    Fraser Busche  FMW:[id number] DOB: 1940/10/22 DOA: 12/26/2023 PCP: Frann Mabel Mt, DO  Chief Complaint  Patient presents with   Code Stroke    Brief Narrative:   83 year old male with past medical history of paroxysmal Maddeline Roorda-fib on Eliquis  last dose on day of presentation in the morning, CAD, heart failure with midrange ejection fraction 45% who presented to the hospital as Raymond Christensen code stroke due to sudden onset expressive aphasia.   History obtained from patient, family and chart review.  Patient had Karsyn Jamie syncopal episode 6 days prior to his presentation and fell and hit the floor with bruising on his head around his right eye.  He is not able to recall what exactly happened.   On presentation to the hospital, he was noted to have significant expressive aphasia.  Code stroke and sent for head CT with findings concerning for acute left hemisphere hemorrhage with subarachnoid and intra-axial component.   Neurosurgery was consulted and they recommended repeat head CT in 6 hours.  Significant Events 12/12 head CT with acute L hemisphere hemorrhage with suspected subarachnoid and intraaxial components with regional hypodense edema Admitted to the ICU  12/26/23 12/13 unchanged L sided subarachnoid hemorrhage, subdural hemorrhage, and suspected L temporal intraparenchymal hemorrhage and edema 12/13 LTM EEG - seizure noted 12/14 neurology signed off 12/14 last neurosurgery note 12/15 orthostatic positive 12/16 TRH assumed care   Assessment & Plan:   Principal Problem:   Subarachnoid hemorrhage (HCC) Active Problems:   Expressive aphasia   Seizure (HCC)  Traumatic L Temporal ICH Traumatic SAH  Subdural Hemorrhage Expressive Aphasia  Seizures Head CT at presentation 12/12 with acute L hemisphere hemorrhage with suspected subarachnoid and intraaxial components with regional hypodense edema CTA head/neck 12/12 negative for intracranial aneurysm, LVO, or CTA spot sign  in association with acute intracranial hemorrhage - atherosclerosis including moderate to severe stenosis of the L ACA A1 segment, up to moderate bilateral supraclinoid ICA stenosis - previous L carotid endarterectomy CT 12/13 unchanged L sided subarachnoid hemorrhage, subdural hemorrhage and suspected L temporal intraparenchymal hemorrhage and edema Repeat head CT obtained today due to some mild word findings issues/lethargy (suspect related to acute hospitalization/fatigue, but obtained to provide reassurance - CT with decreased SAH in L>R cerebral hemispheres, decreased hemorrhage in L temporal lobe with decreased edema in this region, unchanged subdural hematoma over L cerebral convexity, decreased small volume IVH in lateral ventricles Neurosurgery saw last on 12/14 Neurology signed off 12/14 - recommending keppra  1 g BID for seizures in setting of traumatic ICH, SDH, SAH, hypertensive emergency, coagulopathy.  Seizure precautions.  Neurology follow up in 8-12 weeks.   Per [place]  DMV statutes, patients with seizures are not allowed to drive until  they have been seizure-free for six months. Use caution when using heavy equipment or power tools. Avoid working on ladders or at heights. Take showers instead of baths. Ensure the water temperature is not too high on the home water heater. Do not go swimming alone. When caring for infants or small children, sit down when holding, feeding, or changing them to minimize risk of injury to the child in the event you have Patirica Longshore seizure. Also, Maintain good sleep hygiene. Avoid alcohol.  Orthostatic Hypotension Continue compression stockings, abdominal binder Metop low dose due to RVR 12/18 Follow cortisol 12.2 (unlikely adrenal insufficiency), TSH (wnl)  Atrial Fibrillation with RVR Eliquis  has been discontinued in the setting of above (per nsgy, if indicated, can resume in 2 weeks)  Rates improved with IV metop, follow with oral metop    HFmrEF Diastolic Dysfunction S/p mTEER procedure  Moderate Mitral Regurgitation Currently appears euvolemic  Caution with IVF  Hypothyroidism Synthroid , cytomel   BPH Overactive Bladder Proscar  Not taking gemtesa  Depression  Anxiety Continue paxil , ativan   Will discontinue wellbutrin  at discharge due to his seizure above  Dyslipidemia Statin, zetia  on hold      DVT prophylaxis: SCD Code Status: full Family Communication: wife 12/19 at bedside Disposition:   Status is: Inpatient Remains inpatient appropriate because: need for continued inpatient care, therapy evals   Consultants:  Neurology Neurosurgery PCCM  Procedures:  EEG IMPRESSION: This study showed one seizure without clinical signs arising from left temporal region on 12/27/2023 at 131, lasting for about 35 seconds Additionally there is cortical dysfunction arising from left temporal region likely secondary to underlying structural abnormality.    Antimicrobials:  Anti-infectives (From admission, onward)    None       Subjective: No complaints Still fatigued  Objective: Vitals:   01/01/24 2351 01/02/24 0359 01/02/24 0832 01/02/24 1219  BP: (!) 100/45 (!) 143/61 (!) (P) 159/69 (!) 122/58  Pulse: 70 72 75 69  Resp: 17 16 18 18   Temp: 97.7 F (36.5 C) 98.4 F (36.9 C) 98.7 F (37.1 C) 98.4 F (36.9 C)  TempSrc: Oral Oral Oral Oral  SpO2: 94% 98% 100% 95%  Height:        Intake/Output Summary (Last 24 hours) at 01/02/2024 1335 Last data filed at 01/02/2024 9167 Gross per 24 hour  Intake 250 ml  Output 1450 ml  Net -1200 ml   There were no vitals filed for this visit.  Examination:  General: No acute distress. Cardiovascular: RRR Lungs: unlabored Neurological: Alert and oriented 3 - occasionally slow speech or word finding issue. Moves all extremities 4 with equal strength. Cranial nerves II through XII intact. Extremities: No clubbing or cyanosis. No edema.   Data  Reviewed: I have personally reviewed following labs and imaging studies  CBC: Recent Labs  Lab 12/29/23 0453 12/30/23 0312 01/01/24 0307 01/02/24 0606  WBC 10.5 9.9 11.9* 11.8*  NEUTROABS 6.9  --  8.2* 7.9*  HGB 12.8* 12.8* 13.1 12.3*  HCT 39.0 38.2* 38.7* 36.9*  MCV 87.1 87.6 87.0 86.4  PLT 176 163 186 189    Basic Metabolic Panel: Recent Labs  Lab 12/29/23 0453 12/30/23 0312 01/01/24 0307 01/02/24 0606  NA 134* 133* 132* 131*  K 3.8 3.7 3.9 4.0  CL 103 101 100 99  CO2 22 26 23 24   GLUCOSE 103* 105* 104* 102*  BUN 16 17 18 19   CREATININE 0.83 0.79 0.76 0.75  CALCIUM  8.6* 8.1* 8.7* 8.7*  MG  --   --  1.9 1.9  PHOS  --   --  3.0 2.8    GFR: Estimated Creatinine Clearance: 77.6 mL/min (by C-G formula based on SCr of 0.75 mg/dL).  Liver Function Tests: Recent Labs  Lab 12/30/23 0312 01/01/24 0307 01/02/24 0606  AST 16 18 18   ALT 18 16 19   ALKPHOS 59 75 74  BILITOT 1.0 0.8 0.6  PROT 5.7* 5.9* 6.0*  ALBUMIN  3.0* 3.5 3.5    CBG: Recent Labs  Lab 01/01/24 2047 01/01/24 2348 01/02/24 0403 01/02/24 0957 01/02/24 1137  GLUCAP 118* 121* 117* 119* 177*     Recent Results (from the past 240 hours)  MRSA Next Gen by PCR, Nasal     Status: None   Collection Time: 12/26/23  3:51 PM   Specimen: Nasal Mucosa; Nasal Swab  Result Value Ref Range Status   MRSA by PCR Next Gen NOT DETECTED NOT DETECTED Final    Comment: (NOTE) The GeneXpert MRSA Assay (FDA approved for NASAL specimens only), is one component of Makoa Satz comprehensive MRSA colonization surveillance program. It is not intended to diagnose MRSA infection nor to guide or monitor treatment for MRSA infections. Test performance is not FDA approved in patients less than 65 years old. Performed at [facility], [address] [place], [place] [address]          Radiology Studies: CT HEAD WO CONTRAST ( ) Result Date: 01/02/2024 EXAM: CT HEAD WITHOUT CONTRAST 01/02/2024 12:38:01 PM  TECHNIQUE: CT of the head was performed without the administration of intravenous contrast. Automated exposure control, iterative reconstruction, and/or weight based adjustment of the mA/kV was utilized to reduce the radiation dose to as low as reasonably achievable. COMPARISON: 12/27/2023 CLINICAL HISTORY: Neuro deficit, acute, stroke suspected. FINDINGS: BRAIN AND VENTRICLES: No acute hemorrhage. Decreased subarachnoid hemorrhage involving left greater than right cerebral hemispheres. Decreased hemorrhage in left temporal lobe with decreased edema in this region. Unchanged subdural hematoma over left cerebral convexity measuring up to 5 mm in thickness. Decreased small volume intraventricular hemorrhage in lateral ventricles. Stable cerebral atrophy. No evidence of acute infarct. No hydrocephalus. No mass effect or midline shift. ORBITS: Left cataract extraction. SINUSES: Mild mucosal thickening in paranasal sinuses. SOFT TISSUES AND SKULL: No acute soft tissue abnormality. No skull fracture. Calcified atherosclerosis at skull base. IMPRESSION: 1. Decreased subarachnoid hemorrhage involving left greater than right cerebral hemispheres. 2. Decreased hemorrhage in left temporal lobe with decreased edema in this region. 3. Unchanged subdural hematoma over left cerebral convexity measuring up to 5 mm in thickness. 4. Decreased small volume intraventricular hemorrhage in the lateral ventricles. Electronically signed by: Evalene Coho MD 01/02/2024 12:47 PM EST RP Workstation: [id number]         Scheduled Meds:  Chlorhexidine  Gluconate Cloth  6 each Topical Q0600   docusate sodium   100 mg Oral BID   finasteride   5 mg Oral Daily   levETIRAcetam   1,000 mg Oral BID   levothyroxine   75 mcg Oral Daily   liothyronine   10 mcg Oral Daily   metoprolol  tartrate  12.5 mg Oral BID   pantoprazole   40 mg Oral Daily   PARoxetine   10 mg Oral Daily   polyethylene glycol  17 g Oral BID   prednisoLONE   Acet-Moxifloxacin    Ophthalmic QID   Continuous Infusions:     LOS: 7 days    Time spent: over 30 min     Meliton Monte, MD [facility]   To contact the attending provider between 7A-7P or the covering provider during after hours 7P-7A, please log into the web site [email or web address] and access using universal [place] password for that web site. If you do not have the password, please call the hospital operator.  01/02/2024, 1:35 PM    "

## 2024-01-02 NOTE — Progress Notes (Signed)
" ° °  Inpatient Rehabilitation Admissions Coordinator   Await further workup to assist in determining post acute rehab needs.  Heron Leavell, RN, MSN Rehab Admissions Coordinator [phone] 01/02/2024 6:16 PM  "

## 2024-01-02 NOTE — Plan of Care (Signed)
" °  Problem: Spontaneous Subarachnoid Hemorrhage Tissue Perfusion: Goal: Complications of Spontaneous Subarachnoid Hemorrhage will be minimized Outcome: Progressing   Problem: Education: Goal: Knowledge of General Education information will improve Description: Including pain rating scale, medication(s)/side effects and non-pharmacologic comfort measures Outcome: Progressing   Problem: Clinical Measurements: Goal: Ability to maintain clinical measurements within normal limits will improve Outcome: Progressing   Problem: Nutrition: Goal: Adequate nutrition will be maintained Outcome: Progressing   Problem: Elimination: Goal: Will not experience complications related to urinary retention Outcome: Progressing   Problem: Pain Managment: Goal: General experience of comfort will improve and/or be controlled Outcome: Progressing   Problem: Safety: Goal: Ability to remain free from injury will improve Outcome: Progressing   "

## 2024-01-02 NOTE — Progress Notes (Signed)
 Physical Therapy Treatment Patient Details Name: Raymond Christensen MRN: [id number] DOB: 1940-03-27 Today's Date: 01/02/2024   History of Present Illness 83 year old male presented to the hospital as a code stroke 12/12 due to sudden onset expressive aphasia. Found to have acute left hemisphere hemorrhage with subarachnoid and intra-axial component. Did have a fall 6 days PTA. PHMx: paroxysmal A-fib,  CAD, heart failure with midrange ejection fraction 45%, HTN, DM, anxiety, NSTEMI, memory change, OA, skin CA left arm, vaso vagal responses to GI issues.    PT Comments  The pt was agreeable to session despite reports of fatigue upon my arrival, wife present and reports pt needs to be able to ambulate further and more consistently without BP drops prior to return home. The pt required slightly increased assist on initial sit-stand in addition to significantly increased time, effort, and attempts from recliner with use of momentum. The pt tolerated standing with BP stable, then completed bout of ambulation in the room (~59ft) prior to seated rest and additional set of sit-stands for LE strength and blocked practice. After walking back to recliner (~26ft), pt completed 3 of 5 sit-stands from recliner before sitting back into chair and when asked why he stopped to rest, pt noted to have increased R facial drop and was unable to answer questions. The RN was alerted, who came promptly to the room. Pt's strength remained good throughout and speech improved during assessment. MD arrived as well and requested MRI. Given limited activity tolerance, high fall risk, limited physical assistance available from wife, and high level of prior independence, recommendations updated to intensive therapies after d/c.  VITALS:  - sitting in recliner with legs elevated - BP: 118/58 (74); HR: 70bpm - sitting in recliner with legs down - BP: 114/56 (72); HR: 72bpm - standing - BP: 73/60 (64); HR: 98bpm - standing after 3 min - BP:  103/87 (93); HR: 92bpm - sitting after 15 ft ambulation - BP: 98/60 (72); HR: 77bpm   If plan is discharge home, recommend the following: A little help with walking and/or transfers;A little help with bathing/dressing/bathroom;Assistance with cooking/housework;Assist for transportation   Can travel by private Psychologist, Clinical (4 wheels)    Recommendations for Other Services       Precautions / Restrictions Precautions Precautions: Fall Recall of Precautions/Restrictions: Intact Precaution/Restrictions Comments: +orthostatics; TED hose Restrictions Weight Bearing Restrictions Per Provider Order: No     Mobility  Bed Mobility Overal bed mobility: Needs Assistance             General bed mobility comments: pt OOB in recliner at start and end of session    Transfers Overall transfer level: Needs assistance Equipment used: Rollator (4 wheels) Transfers: Sit to/from Stand Sit to Stand: Min assist, Contact guard assist           General transfer comment: minA initially, increased time, effort, and attempts from recliner with use of momentum. completed x5 from chair, x3 from recliner    Ambulation/Gait Ambulation/Gait assistance: Contact guard assist, Min assist Gait Distance (Feet): 15 Feet (+ 20 ft) Assistive device: Rollator (4 wheels) Gait Pattern/deviations: Step-to pattern, Step-through pattern, Decreased stride length, Trunk flexed Gait velocity: decreased Gait velocity interpretation: <1.31 ft/sec, indicative of household ambulator   General Gait Details: slow pace, cues for rollator brakes, minA to steady and manage steering in tight spaces around room.    Modified Rankin (Stroke Patients Only)       Balance  Overall balance assessment: Needs assistance Sitting-balance support: No upper extremity supported, Feet supported Sitting balance-Leahy Scale: Good     Standing balance support: Bilateral upper extremity  supported, Reliant on assistive device for balance, During functional activity Standing balance-Leahy Scale: Poor Standing balance comment: dependent on BUE support and minA at times with dynamic movement and turning. single episode of knees buckling when standing in front of chair                            Communication Communication Communication: Impaired Factors Affecting Communication: Hearing impaired  Cognition Arousal: Alert Behavior During Therapy: Flat affect   PT - Cognitive impairments: Problem solving, Initiation, Memory, Attention, Awareness, Safety/Judgement                       PT - Cognition Comments: slow processing, wife reports memory issues prior to fall, sustained attention. needing cues for technique on first rep of each sit-stand. Following commands: Impaired Following commands impaired: Follows one step commands with increased time    Cueing Cueing Techniques: Verbal cues, Gestural cues  Exercises      General Comments General comments (skin integrity, edema, etc.): BP to low of 73/60 (64) in standing, MAP largely in 70s with mobility      Pertinent Vitals/Pain Pain Assessment Pain Assessment: No/denies pain    Home Living                          Prior Function            PT Goals (current goals can now be found in the care plan section) Acute Rehab PT Goals Patient Stated Goal: to return home Progress towards PT goals: Progressing toward goals    Frequency    Min 3X/week      PT Plan      Co-evaluation              AM-PAC PT 6 Clicks Mobility   Outcome Measure  Help needed turning from your back to your side while in a flat bed without using bedrails?: A Little Help needed moving from lying on your back to sitting on the side of a flat bed without using bedrails?: A Little Help needed moving to and from a bed to a chair (including a wheelchair)?: A Little Help needed standing up from a  chair using your arms (e.g., wheelchair or bedside chair)?: A Little Help needed to walk in hospital room?: A Lot Help needed climbing 3-5 steps with a railing? : Total 6 Click Score: 15    End of Session Equipment Utilized During Treatment: Gait belt Activity Tolerance: Treatment limited secondary to medical complications (Comment) (progression of slurred speech and R facial droop, RN and MD present) Patient left: in chair;with call bell/phone within reach;with chair alarm set;with family/visitor present;with nursing/sitter in room Nurse Communication: Mobility status PT Visit Diagnosis: Difficulty in walking, not elsewhere classified (R26.2);Other abnormalities of gait and mobility (R26.89)     Time: 8689-8651 PT Time Calculation (min) (ACUTE ONLY): 38 min  Charges:    $Gait Training: 8-22 mins $Therapeutic Exercise: 8-22 mins $Therapeutic Activity: 8-22 mins PT General Charges $$ ACUTE PT VISIT: 1 Visit                     Izetta Call, PT, DPT   Acute Rehabilitation Department Office [phone] Secure Chat Communication Preferred  Izetta JULIANNA Call 01/02/2024, 3:45 PM

## 2024-01-03 DIAGNOSIS — I609 Nontraumatic subarachnoid hemorrhage, unspecified: Secondary | ICD-10-CM | POA: Diagnosis not present

## 2024-01-03 LAB — COMPREHENSIVE METABOLIC PANEL WITH GFR
ALT: 28 U/L (ref 0–44)
AST: 25 U/L (ref 15–41)
Albumin: 3.4 g/dL — ABNORMAL LOW (ref 3.5–5.0)
Alkaline Phosphatase: 77 U/L (ref 38–126)
Anion gap: 11 (ref 5–15)
BUN: 15 mg/dL (ref 8–23)
CO2: 20 mmol/L — ABNORMAL LOW (ref 22–32)
Calcium: 8.7 mg/dL — ABNORMAL LOW (ref 8.9–10.3)
Chloride: 100 mmol/L (ref 98–111)
Creatinine, Ser: 0.67 mg/dL (ref 0.61–1.24)
GFR, Estimated: >60 mL/min
Glucose, Bld: 103 mg/dL — ABNORMAL HIGH (ref 70–99)
Potassium: 4 mmol/L (ref 3.5–5.1)
Sodium: 132 mmol/L — ABNORMAL LOW (ref 135–145)
Total Bilirubin: 0.6 mg/dL (ref 0.0–1.2)
Total Protein: 5.8 g/dL — ABNORMAL LOW (ref 6.5–8.1)

## 2024-01-03 LAB — CBC WITH DIFFERENTIAL/PLATELET
Abs Immature Granulocytes: 0.03 K/uL (ref 0.00–0.07)
Basophils Absolute: 0 K/uL (ref 0.0–0.1)
Basophils Relative: 0 %
Eosinophils Absolute: 0.1 K/uL (ref 0.0–0.5)
Eosinophils Relative: 1 %
HCT: 39.4 % (ref 39.0–52.0)
Hemoglobin: 12.9 g/dL — ABNORMAL LOW (ref 13.0–17.0)
Immature Granulocytes: 0 %
Lymphocytes Relative: 23 %
Lymphs Abs: 2.2 K/uL (ref 0.7–4.0)
MCH: 28.9 pg (ref 26.0–34.0)
MCHC: 32.7 g/dL (ref 30.0–36.0)
MCV: 88.1 fL (ref 80.0–100.0)
Monocytes Absolute: 1 K/uL (ref 0.1–1.0)
Monocytes Relative: 10 %
Neutro Abs: 6.3 K/uL (ref 1.7–7.7)
Neutrophils Relative %: 66 %
Platelets: 201 K/uL (ref 150–400)
RBC: 4.47 MIL/uL (ref 4.22–5.81)
RDW: 13.6 % (ref 11.5–15.5)
WBC: 9.6 K/uL (ref 4.0–10.5)
nRBC: 0 % (ref 0.0–0.2)

## 2024-01-03 LAB — GLUCOSE, CAPILLARY
Glucose-Capillary: 102 mg/dL — ABNORMAL HIGH (ref 70–99)
Glucose-Capillary: 105 mg/dL — ABNORMAL HIGH (ref 70–99)
Glucose-Capillary: 111 mg/dL — ABNORMAL HIGH (ref 70–99)
Glucose-Capillary: 114 mg/dL — ABNORMAL HIGH (ref 70–99)
Glucose-Capillary: 166 mg/dL — ABNORMAL HIGH (ref 70–99)
Glucose-Capillary: 94 mg/dL (ref 70–99)

## 2024-01-03 LAB — MAGNESIUM: Magnesium: 1.8 mg/dL (ref 1.7–2.4)

## 2024-01-03 LAB — PHOSPHORUS: Phosphorus: 3.2 mg/dL (ref 2.5–4.6)

## 2024-01-03 MED ORDER — PREDNISOLONE ACET-MOXIFLOXACIN 1-0.5 % OP SUSP: 3.0000 [drp] | Freq: Three times a day (TID) | OPHTHALMIC | Status: DC

## 2024-01-03 MED ORDER — LACTATED RINGERS IV SOLN: INTRAVENOUS | Status: AC

## 2024-01-03 MED ORDER — COSYNTROPIN 0.25 MG IJ SOLR
0.2500 mg | Freq: Once | INTRAMUSCULAR | Status: AC
  Administered 2024-01-04: 0.25 mg via INTRAVENOUS
  Filled 2024-01-03: qty 0.25

## 2024-01-03 MED ORDER — MIDODRINE HCL 5 MG PO TABS
2.5000 mg | ORAL_TABLET | Freq: Three times a day (TID) | ORAL | Status: DC
  Administered 2024-01-03 – 2024-01-05 (×7): 2.5 mg via ORAL
  Filled 2024-01-03 (×8): qty 1

## 2024-01-03 MED ORDER — BISACODYL 10 MG RE SUPP
10.0000 mg | Freq: Once | RECTAL | Status: AC
  Administered 2024-01-03: 10 mg via RECTAL
  Filled 2024-01-03: qty 1

## 2024-01-03 MED ORDER — PREDNISOLON-MOXIFLOX-BROMFENAC 1-0.5-0.075 % OP SOLN
1.0000 [drp] | Freq: Three times a day (TID) | OPHTHALMIC | Status: DC
  Administered 2024-01-03 – 2024-01-12 (×28): 1 [drp] via OPHTHALMIC

## 2024-01-03 NOTE — Evaluation (Signed)
 Speech Language Pathology Evaluation Patient Details Name: Raymond Christensen MRN: [id number] DOB: 1940/09/24 Today's Date: 01/03/2024 Time: 8573-8493 SLP Time Calculation (min) (ACUTE ONLY): 40 min  Problem List:  Patient Active Problem List   Diagnosis Date Noted   Expressive aphasia 12/28/2023   Seizure (HCC) 12/28/2023   Subarachnoid hemorrhage (HCC) 12/26/2023   GAD (generalized anxiety disorder) 01/10/2023   Pain in left wrist 11/20/2022   S/P CABG (coronary artery bypass graft) 06/27/2022   Hypokalemia 06/27/2022   NSTEMI (non-ST elevated myocardial infarction) (HCC) 06/26/2022   PVC's (premature ventricular contractions) 05/31/2022   Lipoma of right upper extremity 03/02/2021   Trigger little finger of left hand 09/29/2020   Secondary hypercoagulable state 02/29/2020   Depression, major, single episode, moderate (HCC) 02/28/2020   Nonrheumatic tricuspid valve regurgitation    S/P mitral valve clip implantation 12/23/2019   Mitral valve disease 12/23/2019   Nonrheumatic mitral valve regurgitation    Persistent atrial fibrillation (HCC)    Chronic HFrEF    Acute idiopathic gout involving toe of left foot    Thrombocytopenia 09/08/2019   CAD (coronary artery disease)    Type 2 diabetes mellitus in patient with obesity (HCC) 06/22/2019   Major depressive disorder    Obstructive sleep apnea on CPAP    Hypothyroidism 10/15/2017   Subclavian artery stenosis, right 06/15/2017   Severe obesity (BMI 35.0-39.9) with comorbidity (HCC) 02/26/2017   Essential hypertension 02/26/2017   Memory change 01/10/2017   Urge incontinence of urine 01/10/2017   Lightheaded 06/25/2016   AK (actinic keratosis) 01/03/2016   H/O syncope 03/06/2014   Occlusion and stenosis of left carotid artery 01/11/2014   Routine history and physical examination of adult 11/08/2013   History of repair of hip joint 04/20/2013   Hyperlipidemia 04/20/2013   Presence of artificial hip joint 04/20/2013   Vitamin  D deficiency 06/18/2010   Concussion 01/24/2010   Elevated prostate specific antigen (PSA) 01/01/2007   Benign prostatic hyperplasia without urinary obstruction 04/22/2006   Osteoarthrosis 04/22/2006   Dyslipidemia 04/02/2006   Past Medical History:  Past Medical History:  Diagnosis Date   Arthritis    CAD (coronary artery disease)    Cancer (HCC)    skin lt. arm.   Carotid artery disease    s/p left CEA 01/17/14   Chronic systolic CHF (congestive heart failure) [facility])    Concussion Summer 2012   Essential hypertension 02/26/2017   Heart disease    Hyperlipidemia    Hypothyroidism 10/15/2017   Major depressive disorder    Mitral valve regurgitation    Myocardial infarction [facility]) 1996, 2021   Obstructive sleep apnea on CPAP    Persistent atrial fibrillation (HCC)    Poor flexibility of tendon 12/01/2018   S/P mitral valve clip implantation 12/23/2019   s/p TEER with MitraClip with one XTW by Dr. Wonda   Subungual hematoma of digit of hand 12/01/2018   Subungual hematoma of right foot 12/01/2018   Type 2 diabetes mellitus without complication, without long-term current use of insulin  (HCC) 02/26/2017   Past Surgical History:  Past Surgical History:  Procedure Laterality Date   ATRIAL FIBRILLATION ABLATION N/A 02/01/2020   Procedure: ATRIAL FIBRILLATION ABLATION;  Surgeon: Inocencio Soyla Lunger, MD;  Location: [facility] INVASIVE CV LAB;  Service: Cardiovascular;  Laterality: N/A;   BYPASS GRAFT  2001   CARDIAC CATHETERIZATION     CARDIOVERSION N/A 11/08/2019   Procedure: CARDIOVERSION;  Surgeon: Delford Maude BROCKS, MD;  Location: [facility] ENDOSCOPY;  Service: Cardiovascular;  Laterality:  N/A;   CAROTID ENDARTERECTOMY Left 01/17/2014   CORONARY ANGIOPLASTY     CORONARY ARTERY BYPASS GRAFT     LEFT HEART CATH AND CORS/GRAFTS ANGIOGRAPHY N/A 06/26/2022   Procedure: LEFT HEART CATH AND CORS/GRAFTS ANGIOGRAPHY;  Surgeon: Burnard Debby LABOR, MD;  Location: [facility] INVASIVE CV LAB;  Service:  Cardiovascular;  Laterality: N/A;   RIGHT/LEFT HEART CATH AND CORONARY/GRAFT ANGIOGRAPHY N/A 09/09/2019   Procedure: RIGHT/LEFT HEART CATH AND CORONARY/GRAFT ANGIOGRAPHY;  Surgeon: Court Dorn PARAS, MD;  Location: [facility] INVASIVE CV LAB;  Service: Cardiovascular;  Laterality: N/A;   TEE WITHOUT CARDIOVERSION N/A 09/13/2019   Procedure: TRANSESOPHAGEAL ECHOCARDIOGRAM (TEE);  Surgeon: Lonni Slain, MD;  Location: [facility] ENDOSCOPY;  Service: Cardiovascular;  Laterality: N/A;   TEE WITHOUT CARDIOVERSION N/A 12/23/2019   Procedure: TRANSESOPHAGEAL ECHOCARDIOGRAM (TEE);  Surgeon: Wonda Sharper, MD;  Location: [facility] INVASIVE CV LAB;  Service: Cardiovascular;  Laterality: N/A;   TEE WITHOUT CARDIOVERSION  02/01/2020   Procedure: TRANSESOPHAGEAL ECHOCARDIOGRAM (TEE);  Surgeon: Inocencio Soyla Lunger, MD;  Location: [facility] INVASIVE CV LAB;  Service: Cardiovascular;;   TOTAL HIP ARTHROPLASTY  1994, 1995   TOTAL HIP ARTHROPLASTY Bilateral    TRANSCATHETER MITRAL EDGE TO EDGE REPAIR N/A 12/23/2019   Procedure: MITRAL VALVE REPAIR;  Surgeon: Wonda Sharper, MD;  Location: [facility] INVASIVE CV LAB;  Service: Cardiovascular;  Laterality: N/A;   TRANSURETHRAL RESECTION OF PROSTATE N/A 12/27/2022   Procedure: TRANSURETHRAL RESECTION OF THE PROSTATE (TURP);  Surgeon: Nieves Cough, MD;  Location: [facility] ORS;  Service: Urology;  Laterality: N/A;  75 minute case   HPI:  83 year old male presented to the hospital as a code stroke 12/12 due to sudden onset expressive aphasia. Found to have acute left hemisphere hemorrhage with subarachnoid and intra-axial component. Did have a fall 6 days PTA. SLE (12/29/23) with pt at baseline across all areas. New SLE ordered due to, Family with concerns pt is still having word finding difficulty and slurring. PHMx: paroxysmal A-fib,  CAD, heart failure with midrange ejection fraction 45%, HTN, DM, anxiety, NSTEMI, memory change, OA, skin CA left arm.   Assessment / Plan /  Recommendation Clinical Impression  Pt presents with expressive more than receptive aphasia, with wife at bedside noting pt fluctuating with his communication abilities from day to day. He is oriented x4 and reports no changes in cognitive function. Speech is clear and intelligible at the simple conversation level. What is evident during conversational speech are moments of halting/hesitation, word finding and perseveration. Using portions of the Western Aphasia Battery Bedside (WAB-B), pt noted with semantic paraphasias during confrontational naming task (18/20) and phonemic paraphasias with repetition task (bog for dog). x2 phonemic errors noted when given a short paragraph to read aloud. He was 100% accurate when writing name and short sentence. He follows complex commands well, with some repeition needed. 9/10 y/n questions answered correctly, with mod-complex question posing greatest difficulty. Educated pt and wife regarding use of total communication strategies including: describing the word, writing it, drawing, looking a picture up in his phone, pointing/gesturing, etc. Wife reports pt drew a picture and wrote pay bill, the other day as a way to communicate to her.         PLAN: Recommend SLP continue f/u for tx of aphasia in the acute setting. Would recommend SLP f/u at next venue of care as well.    SLP Assessment  SLP Recommendation/Assessment: Patient needs continued Speech Language Pathology Services SLP Visit Diagnosis: Aphasia (R47.01)     Assistance Recommended at Discharge  Intermittent Supervision/Assistance  Functional Status Assessment Patient has had a recent decline in their functional status and demonstrates the ability to make significant improvements in function in a reasonable and predictable amount of time.  Frequency and Duration min 2x/week  2 weeks      SLP Evaluation Cognition  Overall Cognitive Status: History of cognitive impairments - at  baseline Arousal/Alertness: Awake/alert Orientation Level: Oriented X4 Year: 2025 Month: December Day of Week: Correct Attention: Sustained Sustained Attention: Appears intact Awareness: Appears intact       Comprehension  Auditory Comprehension Overall Auditory Comprehension: Impaired Yes/No Questions: Impaired Complex Questions: 75-100% accurate Commands: Within Functional Limits Conversation: Complex Visual Recognition/Discrimination Discrimination: Within Function Limits Reading Comprehension Reading Status: Impaired Word level: Within functional limits Sentence Level: Within functional limits Paragraph Level: Impaired    Expression Expression Primary Mode of Expression: Verbal Verbal Expression Overall Verbal Expression: Impaired Initiation: No impairment Automatic Speech: Name;Social Response Level of Generative/Spontaneous Verbalization: Sentence Repetition: Impaired Level of Impairment: Sentence level Naming: Impairment Responsive: Not tested Confrontation: Impaired Convergent: Not tested Divergent: Not tested Verbal Errors: Semantic paraphasias;Phonemic paraphasias;Perseveration Pragmatics: No impairment Effective Techniques: Semantic cues;Sentence completion;Phonemic cues Non-Verbal Means of Communication: Writing;Drawing Written Expression Dominant Hand: Right Written Expression: Within Functional Limits   Oral / Motor  Oral Motor/Sensory Function Overall Oral Motor/Sensory Function: Within functional limits Motor Speech Overall Motor Speech: Appears within functional limits for tasks assessed             Wilder Kin, MA, CCC-SLP Acute Rehabilitation Services Office Number: [phone]  Wilder KANDICE Kin 01/03/2024, 3:25 PM

## 2024-01-03 NOTE — Plan of Care (Signed)
   Problem: Coping: Goal: Will verbalize positive feelings about self Outcome: Progressing

## 2024-01-03 NOTE — Progress Notes (Signed)
 Physical Therapy Treatment Patient Details Name: Raymond Christensen MRN: [id number] DOB: 08/10/1940 Today's Date: 01/03/2024   History of Present Illness 83 y.o. M presented to the hospital as a code stroke 12/12 due to sudden onset expressive aphasia. Found to have acute left hemisphere hemorrhage with subarachnoid and intra-axial component. Did have a fall 6 days PTA. Repeat imaging 12/19: MRI Brain (-) for infarct and no other acute intracranial abnormality; CT Head showed decreased SAH involving L>R cerebral hemispheres, decreased hemorrhage in left temporal lobe with decreased edema in this region, unchanged subdural hematoma over left cerebral convexity, and decreased small volume intraventricular hemorrhage in the lateral ventricles. PHMx: paroxysmal A-fib,  CAD, heart failure with midrange ejection fraction 45%, HTN, DM, anxiety, NSTEMI, memory change, OA, skin CA left arm, vaso vagal responses to GI issues.    PT Comments  Pt greeted seated in recliner chair, pleasant and agreeable to PT session. He had abdominal binder off, but was wearing bilateral TED. Donned abdominal binder prior to assessing Orthostatics. Pt had a 39 mmHg drop in SBP from sit>stand. During standing, pt's MAP <65, initially 63 and declining to 61 after transfer towards bed. Deferred further mobility. Pt completed a couple of bridges and quad set+SLR bilateral x10 reps. Will continue to follow acutely and advance appropriately.    01/03/24 1405  Vital Signs  Patient Position (if appropriate) Orthostatic Vitals  Orthostatic Lying   BP- Lying 117/55  Orthostatic Sitting  BP- Sitting 113/57  Orthostatic Standing at 0 minutes  BP- Standing at 0 minutes (!) 74/55  Orthostatic Standing at 3 minutes  BP- Standing at 3 minutes (!) 68/54      If plan is discharge home, recommend the following: A little help with walking and/or transfers;A little help with bathing/dressing/bathroom;Assistance with cooking/housework;Assist for  transportation   Can travel by private Psychologist, Clinical (4 wheels)    Recommendations for Other Services       Precautions / Restrictions Precautions Precautions: Fall Recall of Precautions/Restrictions: Intact Precaution/Restrictions Comments: Watch BP; Abdominal Binder & TED hose Restrictions Weight Bearing Restrictions Per Provider Order: No     Mobility  Bed Mobility Overal bed mobility: Needs Assistance Bed Mobility: Sit to Supine       Sit to supine: Contact guard assist   General bed mobility comments: Pt greeted seated in recliner chair. Returned to bed with light assist to bring BLE back in.    Transfers Overall transfer level: Needs assistance Equipment used: Rollator (4 wheels) Transfers: Sit to/from Stand, Bed to chair/wheelchair/BSC Sit to Stand: Min assist   Step pivot transfers: Min assist       General transfer comment: Brought over pt's rollator and instructed him to lock the breaks. He attempted to use BUE at the same time and was unsuccessful. Cued pt to do it one at a time and he was able to achieve a locked position with increased time. Cued proper hand placement. Pt powered up to stand with minA. Increased time to achieve upright posture. Instructed pt to bring feet shoulder width apart. Transferred to bed on right. Good eccentric control.    Ambulation/Gait               General Gait Details: Deferred d/t symptomatic OH with MAP <65.   Stairs             Wheelchair Mobility     Tilt Bed    Modified Rankin (Stroke Patients Only)  Modified Rankin (Stroke Patients Only) Pre-Morbid Rankin Score: No symptoms Modified Rankin: Moderately severe disability     Balance Overall balance assessment: Needs assistance Sitting-balance support: No upper extremity supported, Feet supported Sitting balance-Leahy Scale: Fair     Standing balance support: Bilateral upper extremity supported, Reliant  on assistive device for balance, During functional activity Standing balance-Leahy Scale: Poor                              Communication Communication Communication: Impaired Factors Affecting Communication: Difficulty expressing self  Cognition Arousal: Alert Behavior During Therapy: Flat affect   PT - Cognitive impairments: Problem solving, Initiation, Memory, Attention, Awareness, Safety/Judgement                       PT - Cognition Comments: Pt with delayed processing. He requires cues in order to facilitate movement. Following commands: Impaired Following commands impaired: Follows one step commands with increased time    Cueing Cueing Techniques: Verbal cues, Gestural cues  Exercises General Exercises - Lower Extremity Quad Sets: Supine, Both, AROM, 10 reps Straight Leg Raises: Supine, Both, AROM, 10 reps Other Exercises Other Exercises: Supine Bridges: x3 reps. Pt achieved full hip clearence and maintained up in the air for ~3 seconds. Discontinued when he c/o back pain.    General Comments General comments (skin integrity, edema, etc.): Pt greeted with bilateral Ted Hose donned and abdominal binder off. Donned abdominal binder and assessed BP: seated 113/57 (74), stand 74/55 (63), transferred to recliner chair and standing for ~1.72mins 68/54 (61), sitting EOB 114/93 (102), supine in bed 117/55 (73). Pt denied dizziness, but c/o lightheadedness when questioned while sitting EOB. Doffed abdominal binder and ted hose once supine in bed. Encouraged pt to completed BLE exercises while in bed and complete OOB<>chair transfer 2-3x/day maintaining abdominal binder and ted hose on while up. Pt's spouse reported the doctor had pt take off abdominal binder while seated in recliner chair.      Pertinent Vitals/Pain Pain Assessment Pain Assessment: Faces Faces Pain Scale: Hurts little more Pain Location: R knee and Back Pain Descriptors / Indicators: Discomfort,  Grimacing Pain Intervention(s): Monitored during session, Limited activity within patient's tolerance, Repositioned    Home Living                          Prior Function            PT Goals (current goals can now be found in the care plan section) Progress towards PT goals: Not progressing toward goals - comment (limited by hypotension, MAP <65)    Frequency    Min 3X/week      PT Plan      Co-evaluation              AM-PAC PT 6 Clicks Mobility   Outcome Measure  Help needed turning from your back to your side while in a flat bed without using bedrails?: A Little Help needed moving from lying on your back to sitting on the side of a flat bed without using bedrails?: A Little Help needed moving to and from a bed to a chair (including a wheelchair)?: A Little Help needed standing up from a chair using your arms (e.g., wheelchair or bedside chair)?: A Little Help needed to walk in hospital room?: A Lot Help needed climbing 3-5 steps with a railing? : A Lot 6  Click Score: 16    End of Session Equipment Utilized During Treatment: Gait belt Activity Tolerance: Treatment limited secondary to medical complications (Comment) (symptomatic OH with MAP <65) Patient left: in bed;with call bell/phone within reach;with bed alarm set;with family/visitor present;with SCD's reapplied Nurse Communication: Mobility status;Other (comment) (BP response during session) PT Visit Diagnosis: Difficulty in walking, not elsewhere classified (R26.2);Other abnormalities of gait and mobility (R26.89)     Time: 1330-1405 PT Time Calculation (min) (ACUTE ONLY): 35 min  Charges:    $Therapeutic Exercise: 8-22 mins $Therapeutic Activity: 8-22 mins PT General Charges $$ ACUTE PT VISIT: 1 Visit                     Randall SAUNDERS, PT, DPT Acute Rehabilitation Services Office: [phone] Secure Chat Preferred  Raymond Christensen 01/03/2024, 2:50 PM

## 2024-01-03 NOTE — Progress Notes (Addendum)
 " PROGRESS NOTE    Raymond Christensen  FMW:[id number] DOB: 10/13/1940 DOA: 12/26/2023 PCP: Frann Mabel Mt, DO  Chief Complaint  Patient presents with   Code Stroke    Brief Narrative:   83 year old male with past medical history of paroxysmal [name]-fib on Eliquis  last dose on day of presentation in the morning, CAD, heart failure with midrange ejection fraction 45% who presented to the hospital as Raymond Christensen code stroke due to sudden onset expressive aphasia.   History obtained from patient, family and chart review.  Patient had Clint Biello syncopal episode 6 days prior to his presentation and fell and hit the floor with bruising on his head around his right eye.  He is not able to recall what exactly happened.   On presentation to the hospital, he was noted to have significant expressive aphasia.  Code stroke and sent for head CT with findings concerning for acute left hemisphere hemorrhage with subarachnoid and intra-axial component.   Neurosurgery was consulted and they recommended repeat head CT in 6 hours.  Significant Events 12/12 head CT with acute L hemisphere hemorrhage with suspected subarachnoid and intraaxial components with regional hypodense edema Admitted to the ICU  12/26/23 12/13 unchanged L sided subarachnoid hemorrhage, subdural hemorrhage, and suspected L temporal intraparenchymal hemorrhage and edema 12/13 LTM EEG - seizure noted 12/14 neurology signed off 12/14 last neurosurgery note 12/15 orthostatic positive 12/16 TRH assumed care   Post ICU care complicated by orthostatic hypotension limiting his ability to participate therapy.  We've started him on compression stockings, abdominal binder.  Started midodrine .    Discharge pending improvement in orthostasis.  Assessment & Plan:   Principal Problem:   Subarachnoid hemorrhage (HCC) Active Problems:   Expressive aphasia   Seizure (HCC)  Traumatic L Temporal ICH Traumatic SAH  Subdural Hemorrhage Expressive Aphasia   Seizures Head CT at presentation 12/12 with acute L hemisphere hemorrhage with suspected subarachnoid and intraaxial components with regional hypodense edema CTA head/neck 12/12 negative for intracranial aneurysm, LVO, or CTA spot sign in association with acute intracranial hemorrhage - atherosclerosis including moderate to severe stenosis of the L ACA A1 segment, up to moderate bilateral supraclinoid ICA stenosis - previous L carotid endarterectomy CT 12/13 unchanged L sided subarachnoid hemorrhage, subdural hemorrhage and suspected L temporal intraparenchymal hemorrhage and edema Repeat head CT 12/19  with decreased SAH in L>R cerebral hemispheres, decreased hemorrhage in L temporal lobe with decreased edema in this region, unchanged subdural hematoma over L cerebral convexity, decreased small volume IVH in lateral ventricles MRI with subarachnoid hemorrhage involving the L greater than R cerebral hemispheres, subdural hematoma overlying the L cerebral convexity measuring up to 4 mm in maximal thickness without significant mass effect or  midline shift (12/20) we obtained this due to some word finding difficulty and transient encephalopathy - suspect this is due to hypotension while working with therapy in setting of his SAH.   Neurosurgery saw last on 12/14 Neurology signed off 12/14 - recommending keppra  1 g BID for seizures in setting of traumatic ICH, SDH, SAH, hypertensive emergency, coagulopathy.  Seizure precautions.  Neurology follow up in 8-12 weeks.   Per [place]  DMV statutes, patients with seizures are not allowed to drive until  they have been seizure-free for six months. Use caution when using heavy equipment or power tools. Avoid working on ladders or at heights. Take showers instead of baths. Ensure the water temperature is not too high on the home water heater. Do not go swimming alone. When caring  for infants or small children, sit down when holding, feeding, or changing them to  minimize risk of injury to the child in the event you have Adora Yeh seizure. Also, Maintain good sleep hygiene. Avoid alcohol.  Orthostatic Hypotension Continue compression stockings, abdominal binder when OOB Metop low dose resumed due to RVR 12/18 Was trying to avoid midodrine  with his bleed above, but continued significant orthostasis, will start and follow Follow cortisol 12.2 (unlikely adrenal insufficiency - will follow acth  stim), TSH (wnl) Echo   Atrial Fibrillation with RVR Eliquis  has been discontinued in the setting of above (per nsgy, if indicated, can resume in 2 weeks) Rates improved with IV metop, follow with oral metop   HFmrEF Diastolic Dysfunction S/p mTEER procedure  Moderate Mitral Regurgitation Currently appears euvolemic  Caution with IVF  Hypothyroidism Synthroid , cytomel   BPH Overactive Bladder Proscar  Not taking gemtesa  Depression  Anxiety Continue paxil , ativan   Will discontinue wellbutrin  at discharge due to his seizure above  Dyslipidemia Statin, zetia  on hold      DVT prophylaxis: SCD Code Status: full Family Communication: wife 12/20 at bedside Disposition:   Status is: Inpatient Remains inpatient appropriate because: need for continued inpatient care, therapy evals   Consultants:  Neurology Neurosurgery PCCM  Procedures:  EEG IMPRESSION: This study showed one seizure without clinical signs arising from left temporal region on 12/27/2023 at 131, lasting for about 35 seconds Additionally there is cortical dysfunction arising from left temporal region likely secondary to underlying structural abnormality.    Antimicrobials:  Anti-infectives (From admission, onward)    None       Subjective: Constipated Frustrated in general   Objective: Vitals:   01/03/24 0423 01/03/24 0453 01/03/24 0752 01/03/24 1618  BP: (!) 152/74 138/73 (!) 147/73 139/68  Pulse: 70 69    Resp: 18 18 16    Temp: 98.1 F (36.7 C) 98.1 F (36.7 C)  97.8 F (36.6 C) (!) 97.3 F (36.3 C)  TempSrc: Oral Oral Oral Oral  SpO2: 97%  98% 98%  Height:        Intake/Output Summary (Last 24 hours) at 01/03/2024 1657 Last data filed at 01/03/2024 9380 Gross per 24 hour  Intake 180 ml  Output 1950 ml  Net -1770 ml   There were no vitals filed for this visit.  Examination:  General: No acute distress. Cardiovascular: RRR Lungs: unlabored Abdomen: Soft, nontender, nondistended  Neurological: Alert. Moves all extremities 4. Cranial nerves II through XII grossly intact. Extremities: No clubbing or cyanosis. No edema.   Data Reviewed: I have personally reviewed following labs and imaging studies  CBC: Recent Labs  Lab 12/29/23 0453 12/30/23 0312 01/01/24 0307 01/02/24 0606 01/03/24 0907  WBC 10.5 9.9 11.9* 11.8* 9.6  NEUTROABS 6.9  --  8.2* 7.9* 6.3  HGB 12.8* 12.8* 13.1 12.3* 12.9*  HCT 39.0 38.2* 38.7* 36.9* 39.4  MCV 87.1 87.6 87.0 86.4 88.1  PLT 176 163 186 189 201    Basic Metabolic Panel: Recent Labs  Lab 12/29/23 0453 12/30/23 0312 01/01/24 0307 01/02/24 0606 01/03/24 0907  NA 134* 133* 132* 131* 132*  K 3.8 3.7 3.9 4.0 4.0  CL 103 101 100 99 100  CO2 22 26 23 24  20*  GLUCOSE 103* 105* 104* 102* 103*  BUN 16 17 18 19 15   CREATININE 0.83 0.79 0.76 0.75 0.67  CALCIUM  8.6* 8.1* 8.7* 8.7* 8.7*  MG  --   --  1.9 1.9 1.8  PHOS  --   --  3.0 2.8 3.2    GFR: Estimated Creatinine Clearance: 77.6 mL/min (by C-G formula based on SCr of 0.67 mg/dL).  Liver Function Tests: Recent Labs  Lab 12/30/23 0312 01/01/24 0307 01/02/24 0606 01/03/24 0907  AST 16 18 18 25   ALT 18 16 19 28   ALKPHOS 59 75 74 77  BILITOT 1.0 0.8 0.6 0.6  PROT 5.7* 5.9* 6.0* 5.8*  ALBUMIN  3.0* 3.5 3.5 3.4*    CBG: Recent Labs  Lab 01/02/24 2046 01/03/24 0343 01/03/24 0808 01/03/24 1152 01/03/24 1623  GLUCAP 124* 111* 114* 166* 94     Recent Results (from the past 240 hours)  MRSA Next Gen by PCR, Nasal     Status: None    Collection Time: 12/26/23  3:51 PM   Specimen: Nasal Mucosa; Nasal Swab  Result Value Ref Range Status   MRSA by PCR Next Gen NOT DETECTED NOT DETECTED Final    Comment: (NOTE) The GeneXpert MRSA Assay (FDA approved for NASAL specimens only), is one component of [facility] comprehensive MRSA colonization surveillance program. It is not intended to diagnose MRSA infection nor to guide or monitor treatment for MRSA infections. Test performance is not FDA approved in patients less than 6 years old. Performed at [facility], [address], [place], [place] [address]          Radiology Studies: MR BRAIN WO CONTRAST Result Date: 01/02/2024 CLINICAL DATA:  Initial evaluation for acute neuro deficit, stroke suspected. EXAM: MRI HEAD WITHOUT CONTRAST TECHNIQUE: Multiplanar, multiecho pulse sequences of the brain and surrounding structures were obtained without intravenous contrast. COMPARISON:  CT from earlier the same day as well as prior studies. FINDINGS: Brain: Moderately advanced cerebral and cerebellar atrophy. Remote lacunar infarct present at the left basal ganglia. No evidence for acute or subacute infarct. No areas of chronic cortical infarction. Scattered subarachnoid hemorrhage involving the left cerebral hemisphere again seen, stable from prior CT. Involvement most pronounced at the left sylvian fissure where there is associated mild localized edema. More mild involvement of the contralateral right cerebral hemisphere. Subdural hematoma overlying the left cerebral convexity measures up to 4 mm in maximal thickness. No significant mass effect or midline shift. No mass lesion. Mild ventricular prominence related atrophy. No hydrocephalus. Pituitary gland within normal limits. Vascular: Major intracranial vascular flow voids are maintained. Skull and upper cervical spine: Cranial junction with normal limits. Bone marrow signal intensity normal. No visible scalp soft tissue abnormality.  Sinuses/Orbits: Prior ocular lens replacement on the left. Paranasal sinuses are largely clear. No significant mastoid effusion. Other: None. IMPRESSION: 1. Scattered subarachnoid hemorrhage involving the left greater than right cerebral hemispheres, stable from prior CT. 2. Subdural hematoma overlying the left cerebral convexity measuring up to 4 mm in maximal thickness without significant mass effect or midline shift. 3. No other acute intracranial abnormality.  Negative for infarct. 4. Moderately advanced cerebral and cerebellar atrophy with remote lacunar infarct at the left basal ganglia. Electronically Signed   By: Morene Hoard M.D.   On: 01/02/2024 18:16   CT HEAD WO CONTRAST ( ) Result Date: 01/02/2024 EXAM: CT HEAD WITHOUT CONTRAST 01/02/2024 12:38:01 PM TECHNIQUE: CT of the head was performed without the administration of intravenous contrast. Automated exposure control, iterative reconstruction, and/or weight based adjustment of the mA/kV was utilized to reduce the radiation dose to as low as reasonably achievable. COMPARISON: 12/27/2023 CLINICAL HISTORY: Neuro deficit, acute, stroke suspected. FINDINGS: BRAIN AND VENTRICLES: No acute hemorrhage. Decreased subarachnoid hemorrhage involving left greater than  right cerebral hemispheres. Decreased hemorrhage in left temporal lobe with decreased edema in this region. Unchanged subdural hematoma over left cerebral convexity measuring up to 5 mm in thickness. Decreased small volume intraventricular hemorrhage in lateral ventricles. Stable cerebral atrophy. No evidence of acute infarct. No hydrocephalus. No mass effect or midline shift. ORBITS: Left cataract extraction. SINUSES: Mild mucosal thickening in paranasal sinuses. SOFT TISSUES AND SKULL: No acute soft tissue abnormality. No skull fracture. Calcified atherosclerosis at skull base. IMPRESSION: 1. Decreased subarachnoid hemorrhage involving left greater than right cerebral hemispheres. 2.  Decreased hemorrhage in left temporal lobe with decreased edema in this region. 3. Unchanged subdural hematoma over left cerebral convexity measuring up to 5 mm in thickness. 4. Decreased small volume intraventricular hemorrhage in the lateral ventricles. Electronically signed by: Evalene Coho MD 01/02/2024 12:47 PM EST RP Workstation: [id number]         Scheduled Meds:  Chlorhexidine  Gluconate Cloth  6 each Topical Q0600   docusate sodium   100 mg Oral BID   finasteride   5 mg Oral Daily   levETIRAcetam   1,000 mg Oral BID   levothyroxine   75 mcg Oral Daily   liothyronine   10 mcg Oral Daily   metoprolol  tartrate  12.5 mg Oral BID   pantoprazole   40 mg Oral Daily   PARoxetine   10 mg Oral Daily   polyethylene glycol  17 g Oral BID   Prednisolon-Moxiflox-Bromfenac   1 drop Left Eye TID   Continuous Infusions:     LOS: 8 days    Time spent: over 30 min     Meliton Monte, MD [facility]   To contact the attending provider between 7A-7P or the covering provider during after hours 7P-7A, please log into the web site [email or web address] and access using universal [place] password for that web site. If you do not have the password, please call the hospital operator.  01/03/2024, 4:57 PM    "

## 2024-01-03 NOTE — Progress Notes (Signed)
 Pt refuses blood draw this morning, MD made aware.

## 2024-01-03 NOTE — Plan of Care (Signed)
 Pt  received and stated that he had difficulty of speaking after being transferred from the chair to bed denies of any dizziness and headache, when detailed assessment were done, he stated that he don't have any difficulty speaking anymore and upon assessment it was resolved, VS taken, all normal and stable. Informed MD Howerter and advise to update if it will occur again. To closely monitor the pt.  Had also elevated blood pressure episodes overnight, prn meds were given.   Problem: Spontaneous Subarachnoid Hemorrhage Tissue Perfusion: Goal: Complications of Spontaneous Subarachnoid Hemorrhage will be minimized Outcome: Progressing   Problem: Coping: Goal: Will verbalize positive feelings about self Outcome: Progressing   Problem: Health Behavior/Discharge Planning: Goal: Ability to manage health-related needs will improve Outcome: Progressing   Problem: Self-Care: Goal: Ability to communicate needs accurately will improve Outcome: Progressing   Problem: Nutrition: Goal: Risk of aspiration will decrease Outcome: Progressing   Problem: Health Behavior/Discharge Planning: Goal: Ability to manage health-related needs will improve Outcome: Progressing   Problem: Activity: Goal: Risk for activity intolerance will decrease Outcome: Progressing   Problem: Nutrition: Goal: Adequate nutrition will be maintained Outcome: Progressing   Problem: Coping: Goal: Level of anxiety will decrease Outcome: Progressing   Problem: Elimination: Goal: Will not experience complications related to bowel motility Outcome: Progressing   Problem: Pain Managment: Goal: General experience of comfort will improve and/or be controlled Outcome: Progressing   Problem: Safety: Goal: Ability to remain free from injury will improve Outcome: Progressing   Problem: Skin Integrity: Goal: Risk for impaired skin integrity will decrease Outcome: Progressing

## 2024-01-04 ENCOUNTER — Inpatient Hospital Stay (HOSPITAL_COMMUNITY)

## 2024-01-04 DIAGNOSIS — I951 Orthostatic hypotension: Secondary | ICD-10-CM

## 2024-01-04 DIAGNOSIS — R569 Unspecified convulsions: Secondary | ICD-10-CM | POA: Diagnosis not present

## 2024-01-04 DIAGNOSIS — R008 Other abnormalities of heart beat: Secondary | ICD-10-CM | POA: Diagnosis not present

## 2024-01-04 DIAGNOSIS — R4701 Aphasia: Secondary | ICD-10-CM | POA: Diagnosis not present

## 2024-01-04 DIAGNOSIS — I609 Nontraumatic subarachnoid hemorrhage, unspecified: Secondary | ICD-10-CM | POA: Diagnosis not present

## 2024-01-04 LAB — MAGNESIUM: Magnesium: 1.8 mg/dL (ref 1.7–2.4)

## 2024-01-04 LAB — CBC WITH DIFFERENTIAL/PLATELET
Abs Immature Granulocytes: 0.03 K/uL (ref 0.00–0.07)
Basophils Absolute: 0 K/uL (ref 0.0–0.1)
Basophils Relative: 0 %
Eosinophils Absolute: 0.2 K/uL (ref 0.0–0.5)
Eosinophils Relative: 2 %
HCT: 37.1 % — ABNORMAL LOW (ref 39.0–52.0)
Hemoglobin: 12.5 g/dL — ABNORMAL LOW (ref 13.0–17.0)
Immature Granulocytes: 0 %
Lymphocytes Relative: 24 %
Lymphs Abs: 2.4 K/uL (ref 0.7–4.0)
MCH: 28.9 pg (ref 26.0–34.0)
MCHC: 33.7 g/dL (ref 30.0–36.0)
MCV: 85.9 fL (ref 80.0–100.0)
Monocytes Absolute: 0.9 K/uL (ref 0.1–1.0)
Monocytes Relative: 10 %
Neutro Abs: 6.2 K/uL (ref 1.7–7.7)
Neutrophils Relative %: 64 %
Platelets: 199 K/uL (ref 150–400)
RBC: 4.32 MIL/uL (ref 4.22–5.81)
RDW: 13.5 % (ref 11.5–15.5)
WBC: 9.7 K/uL (ref 4.0–10.5)
nRBC: 0 % (ref 0.0–0.2)

## 2024-01-04 LAB — COMPREHENSIVE METABOLIC PANEL WITH GFR
ALT: 26 U/L (ref 0–44)
AST: 19 U/L (ref 15–41)
Albumin: 3.4 g/dL — ABNORMAL LOW (ref 3.5–5.0)
Alkaline Phosphatase: 79 U/L (ref 38–126)
Anion gap: 8 (ref 5–15)
BUN: 14 mg/dL (ref 8–23)
CO2: 26 mmol/L (ref 22–32)
Calcium: 8.7 mg/dL — ABNORMAL LOW (ref 8.9–10.3)
Chloride: 101 mmol/L (ref 98–111)
Creatinine, Ser: 0.67 mg/dL (ref 0.61–1.24)
GFR, Estimated: >60 mL/min
Glucose, Bld: 99 mg/dL (ref 70–99)
Potassium: 3.8 mmol/L (ref 3.5–5.1)
Sodium: 134 mmol/L — ABNORMAL LOW (ref 135–145)
Total Bilirubin: 0.6 mg/dL (ref 0.0–1.2)
Total Protein: 5.6 g/dL — ABNORMAL LOW (ref 6.5–8.1)

## 2024-01-04 LAB — ACTH STIMULATION, 3 TIME POINTS
Cortisol, 30 Min: 22.2 ug/dL
Cortisol, 60 Min: 25.9 ug/dL
Cortisol, Base: 7.8 ug/dL

## 2024-01-04 LAB — GLUCOSE, CAPILLARY
Glucose-Capillary: 105 mg/dL — ABNORMAL HIGH (ref 70–99)
Glucose-Capillary: 107 mg/dL — ABNORMAL HIGH (ref 70–99)
Glucose-Capillary: 120 mg/dL — ABNORMAL HIGH (ref 70–99)

## 2024-01-04 LAB — ECHOCARDIOGRAM COMPLETE
Area-P 1/2: 3.6 cm2
Calc EF: 61.2 %
Height: 68 in
S' Lateral: 3.3 cm
Single Plane A2C EF: 54.6 %
Single Plane A4C EF: 67.2 %

## 2024-01-04 LAB — PHOSPHORUS: Phosphorus: 3.2 mg/dL (ref 2.5–4.6)

## 2024-01-04 MED ORDER — METOPROLOL SUCCINATE ER 25 MG PO TB24
12.5000 mg | ORAL_TABLET | Freq: Every day | ORAL | Status: DC
  Administered 2024-01-05 – 2024-01-12 (×8): 12.5 mg via ORAL
  Filled 2024-01-04 (×7): qty 1

## 2024-01-04 NOTE — Progress Notes (Signed)
*  PRELIMINARY RESULTS* Echocardiogram 2D Echocardiogram has been performed.  Raymond Christensen 01/04/2024, 12:19 PM

## 2024-01-04 NOTE — Plan of Care (Signed)
  Problem: Self-Care: Goal: Ability to participate in self-care as condition permits will improve Outcome: Progressing   

## 2024-01-04 NOTE — Progress Notes (Signed)
 [facility]  PROGRESS NOTE  Venkat Ankney FMW:[id number] DOB: 17-May-1940 DOA: 12/26/2023 PCP: Frann Mabel Mt, DO   Brief HPI:   83 year old male with past medical history of paroxysmal A-fib on Eliquis  last dose on day of presentation in the morning, CAD, heart failure with midrange ejection fraction 45% who presented to the hospital as a code stroke due to sudden onset expressive aphasia.   History obtained from patient, family and chart review.  Patient had a syncopal episode 6 days prior to his presentation and fell and hit the floor with bruising on his head around his right eye.  He is not able to recall what exactly happened.   On presentation to the hospital, he was noted to have significant expressive aphasia.  Code stroke and sent for head CT with findings concerning for acute left hemisphere hemorrhage with subarachnoid and intra-axial component.   Neurosurgery was consulted and they recommended repeat head CT in 6 hours.   Significant Events 12/12 head CT with acute L hemisphere hemorrhage with suspected subarachnoid and intraaxial components with regional hypodense edema Admitted to the ICU  12/26/23 12/13 unchanged L sided subarachnoid hemorrhage, subdural hemorrhage, and suspected L temporal intraparenchymal hemorrhage and edema 12/13 LTM EEG - seizure noted 12/14 neurology signed off 12/14 last neurosurgery note 12/15 orthostatic positive 12/16 [facility] assumed care    Post ICU care complicated by orthostatic hypotension limiting his ability to participate therapy.  We've started him on compression stockings, abdominal binder.  Started midodrine .     Discharge pending improvement in orthostasis.      Assessment/Plan:   Traumatic L Temporal ICH Traumatic SAH  Subdural Hemorrhage Expressive Aphasia  Seizures Head CT at presentation 12/12 with acute L hemisphere hemorrhage with suspected subarachnoid and intraaxial components with regional hypodense  edema CTA head/neck 12/12 negative for intracranial aneurysm, LVO, or CTA spot sign in association with acute intracranial hemorrhage - atherosclerosis including moderate to severe stenosis of the L ACA A1 segment, up to moderate bilateral supraclinoid ICA stenosis - previous L carotid endarterectomy CT 12/13 unchanged L sided subarachnoid hemorrhage, subdural hemorrhage and suspected L temporal intraparenchymal hemorrhage and edema Repeat head CT 12/19  with decreased SAH in L>R cerebral hemispheres, decreased hemorrhage in L temporal lobe with decreased edema in this region, unchanged subdural hematoma over L cerebral convexity, decreased small volume IVH in lateral ventricles MRI with subarachnoid hemorrhage involving the L greater than R cerebral hemispheres, subdural hematoma overlying the L cerebral convexity measuring up to 4 mm in maximal thickness without significant mass effect or  midline shift (12/20) we obtained this due to some word finding difficulty and transient encephalopathy - suspect this is due to hypotension while working with therapy in setting of his SAH.   Neurosurgery saw last on 12/14 Neurology signed off 12/14 - recommending keppra  1 g BID for seizures in setting of traumatic ICH, SDH, SAH, hypertensive emergency, coagulopathy.  Seizure precautions.  Neurology follow up in 8-12 weeks.    Per [place] statutes, patients with seizures are not allowed to drive until  they have been seizure-free for six months. Use caution when using heavy equipment or power tools. Avoid working on ladders or at heights. Take showers instead of baths. Ensure the water temperature is not too high on the home water heater. Do not go swimming alone. When caring for infants or small children, sit down when holding, feeding, or changing them to minimize risk of injury to the child in the event you  have a seizure. Also, Maintain good sleep hygiene. Avoid alcohol.   Orthostatic  Hypotension Continue compression stockings, abdominal binder when OOB Metop low dose resumed due to RVR 12/18 Started on midodrine  2.5 mg 3 times daily Follow cortisol 12.2 (unlikely adrenal insufficiency -ACTH  stimulation test is normal, TSH (wnl)    Atrial Fibrillation with RVR Eliquis  has been discontinued in the setting of above (per nsgy, if indicated, can resume in 2 weeks) Rates improved with IV metop, follow with oral metop    HFmrEF Diastolic Dysfunction S/p mTEER procedure  Moderate Mitral Regurgitation Currently appears euvolemic  Caution with IVF   Hypothyroidism Synthroid , cytomel    BPH Overactive Bladder Proscar  Not taking gemtesa   Depression  Anxiety Continue paxil , ativan   Will discontinue wellbutrin  at discharge due to his seizure above   Dyslipidemia Statin, zetia  on hold       DVT prophylaxis: SCDs  Medications     Chlorhexidine  Gluconate Cloth  6 each Topical Q0600   docusate sodium   100 mg Oral BID   finasteride   5 mg Oral Daily   levETIRAcetam   1,000 mg Oral BID   levothyroxine   75 mcg Oral Daily   liothyronine   10 mcg Oral Daily   metoprolol  tartrate  12.5 mg Oral BID   midodrine   2.5 mg Oral TID WC   pantoprazole   40 mg Oral Daily   PARoxetine   10 mg Oral Daily   polyethylene glycol  17 g Oral BID   Prednisolon-Moxiflox-Bromfenac   1 drop Left Eye TID     Data Reviewed:   CBG:  Recent Labs  Lab 01/03/24 2213 01/03/24 2349 01/04/24 0345 01/04/24 0407 01/04/24 0844  GLUCAP 102* 105* 107* 105* 120*    SpO2: 97 %    Vitals:   01/03/24 2031 01/03/24 2329 01/04/24 0405 01/04/24 0836  BP: (!) 148/62 (!) 149/59 (!) 153/73 (!) 151/84  Pulse: 70 69 68 74  Resp: 18 18  19   Temp: 97.6 F (36.4 C) 98.3 F (36.8 C) 98.4 F (36.9 C) 98 F (36.7 C)  TempSrc: Oral Oral Oral Oral  SpO2: 98% 98% 98% 97%  Height:          Data Reviewed:  Basic Metabolic Panel: Recent Labs  Lab 12/30/23 0312 01/01/24 0307  01/02/24 0606 01/03/24 0907 01/04/24 0552  NA 133* 132* 131* 132* 134*  K 3.7 3.9 4.0 4.0 3.8  CL 101 100 99 100 101  CO2 26 23 24  20* 26  GLUCOSE 105* 104* 102* 103* 99  BUN 17 18 19 15 14   CREATININE 0.79 0.76 0.75 0.67 0.67  CALCIUM  8.1* 8.7* 8.7* 8.7* 8.7*  MG  --  1.9 1.9 1.8 1.8  PHOS  --  3.0 2.8 3.2 3.2    CBC: Recent Labs  Lab 12/29/23 0453 12/30/23 0312 01/01/24 0307 01/02/24 0606 01/03/24 0907 01/04/24 0552  WBC 10.5 9.9 11.9* 11.8* 9.6 9.7  NEUTROABS 6.9  --  8.2* 7.9* 6.3 6.2  HGB 12.8* 12.8* 13.1 12.3* 12.9* 12.5*  HCT 39.0 38.2* 38.7* 36.9* 39.4 37.1*  MCV 87.1 87.6 87.0 86.4 88.1 85.9  PLT 176 163 186 189 201 199    LFT Recent Labs  Lab 12/30/23 0312 01/01/24 0307 01/02/24 0606 01/03/24 0907 01/04/24 0552  AST 16 18 18 25 19   ALT 18 16 19 28 26   ALKPHOS 59 75 74 77 79  BILITOT 1.0 0.8 0.6 0.6 0.6  PROT 5.7* 5.9* 6.0* 5.8* 5.6*  ALBUMIN  3.0* 3.5 3.5 3.4* 3.4*  Antibiotics: Anti-infectives (From admission, onward)    None        CONSULTS   Code Status: Full code  Family Communication: Called and discussed with son on phone     Subjective   Appears in no acute distress   Objective    Physical Examination:   General-appears in no acute distress Heart-S1-S2, regular, no murmur auscultated Lungs-clear to auscultation bilaterally, no wheezing or crackles auscultated Abdomen-soft, nontender, no organomegaly Extremities-no edema in the lower extremities Neuro-alert, oriented x3, no focal deficit noted            Pati Thinnes S Rilea Arutyunyan   [facility] If 7PM-7AM, please contact night-coverage at [email or web address], Office  [phone]   01/04/2024, 10:23 AM  LOS: 9 days

## 2024-01-04 NOTE — Plan of Care (Signed)
" °  Problem: Coping: Goal: Will verbalize positive feelings about self Outcome: Progressing   Problem: Health Behavior/Discharge Planning: Goal: Ability to manage health-related needs will improve Outcome: Progressing   Problem: Self-Care: Goal: Ability to communicate needs accurately will improve Outcome: Progressing   Problem: Health Behavior/Discharge Planning: Goal: Ability to manage health-related needs will improve Outcome: Progressing   Problem: Clinical Measurements: Goal: Will remain free from infection Outcome: Progressing   Problem: Activity: Goal: Risk for activity intolerance will decrease Outcome: Progressing   Problem: Nutrition: Goal: Adequate nutrition will be maintained Outcome: Progressing   Problem: Elimination: Goal: Will not experience complications related to bowel motility Outcome: Progressing   Problem: Safety: Goal: Ability to remain free from injury will improve Outcome: Progressing   Problem: Skin Integrity: Goal: Risk for impaired skin integrity will decrease Outcome: Progressing   "

## 2024-01-05 ENCOUNTER — Other Ambulatory Visit (HOSPITAL_COMMUNITY): Payer: Self-pay

## 2024-01-05 DIAGNOSIS — I609 Nontraumatic subarachnoid hemorrhage, unspecified: Secondary | ICD-10-CM | POA: Diagnosis not present

## 2024-01-05 DIAGNOSIS — R4701 Aphasia: Secondary | ICD-10-CM | POA: Diagnosis not present

## 2024-01-05 DIAGNOSIS — R569 Unspecified convulsions: Secondary | ICD-10-CM | POA: Diagnosis not present

## 2024-01-05 NOTE — Progress Notes (Addendum)
 Occupational Therapy Treatment Patient Details Name: Raymond Christensen MRN: [id number] DOB: 09-Jul-1940 Today's Date: 01/05/2024   History of present illness 83 y.o. M presented to the hospital as a code stroke 12/12 due to sudden onset expressive aphasia. Found to have acute left hemisphere hemorrhage with subarachnoid and intra-axial component. Did have a fall 6 days PTA. Repeat imaging 12/19: MRI Brain (-) for infarct and no other acute intracranial abnormality; CT Head showed decreased SAH involving L>R cerebral hemispheres, decreased hemorrhage in left temporal lobe with decreased edema in this region, unchanged subdural hematoma over left cerebral convexity, and decreased small volume intraventricular hemorrhage in the lateral ventricles. PHMx: paroxysmal A-fib,  CAD, heart failure with midrange ejection fraction 45%, HTN, DM, anxiety, NSTEMI, memory change, OA, skin CA left arm, vaso vagal responses to GI issues.   OT comments  Pt seen for OT treatment this PM. Supportive wife at bedside. AOX4; remains with flat affect but participatory. Tolerated session well with improved orthostatic BP - see below. Pt relatively asymptomatic, endorsed transient positional dizziness that did not worsen. Functionally, requires min A to stand from toilet with cues for body positional awareness and safety with rollator. CGA for standing UB ADLs. Chair follow for safety. Given deficits related to general weakness and activity tolerance, upgraded recs to high-intensity post-acute rehab > 3 hrs/day. OT to continue to follow.  Orthostatic BP Measurements:  Supine: 143/68 66 EOB: 133/69 78 Stand x1': 117/67 77 Stand x3': 113/68 76 Post mobility: 131/70, 80 HR      If plan is discharge home, recommend the following:  A little help with walking and/or transfers;A little help with bathing/dressing/bathroom;Assist for transportation;Help with stairs or ramp for entrance;Assistance with cooking/housework   Equipment  Recommendations  None recommended by OT    Recommendations for Other Services      Precautions / Restrictions Precautions Precautions: Fall Recall of Precautions/Restrictions: Intact Precaution/Restrictions Comments: orthostatic; TED hose and abdominal binder Restrictions Weight Bearing Restrictions Per Provider Order: No       Mobility Bed Mobility Overal bed mobility: Needs Assistance Bed Mobility: Supine to Sit     Supine to sit: Supervision, HOB elevated, Used rails     General bed mobility comments: exited to the R side    Transfers Overall transfer level: Needs assistance Equipment used: Rollator (4 wheels) Transfers: Sit to/from Stand, Bed to chair/wheelchair/BSC Sit to Stand: Contact guard assist     Step pivot transfers: Contact guard assist     General transfer comment: Needs incr A to stand from toilet 2/2 lower surface height, occasional safety cues for hand placement & locking/unlocking brakes     Balance Overall balance assessment: Needs assistance Sitting-balance support: No upper extremity supported, Feet supported Sitting balance-Leahy Scale: Fair Sitting balance - Comments: EOB   Standing balance support: Bilateral upper extremity supported, Reliant on assistive device for balance, During functional activity Standing balance-Leahy Scale: Poor Standing balance comment: reliant on rollator for UE support                           ADL either performed or assessed with clinical judgement   ADL Overall ADL's : Needs assistance/impaired     Grooming: Supervision/safety;Standing;Contact guard assist;Wash/dry hands;Wash/dry face Grooming Details (indicate cue type and reason): stability at sink, using vanity for support             Lower Body Dressing: Minimal assistance;Sitting/lateral leans;Sit to/from stand   Toilet Transfer: Minimal assistance;Ambulation;Regular Toilet;Rollator (4  wheels) Statistician Details (indicate cue  type and reason): VC for safe approach and hand placement to simulate home environment Toileting- Clothing Manipulation and Hygiene: Minimal assistance;Sitting/lateral lean;Sit to/from stand       Functional mobility during ADLs: Contact guard assist;Rollator (4 wheels)      Extremity/Trunk Assessment Upper Extremity Assessment Upper Extremity Assessment: Generalized weakness            Vision       Perception     Praxis     Communication Communication Communication: Impaired Factors Affecting Communication: Difficulty expressing self   Cognition Arousal: Alert Behavior During Therapy: Flat affect Cognition: No apparent impairments             OT - Cognition Comments: wife reporting pt is near his cognitive baseline, although demo's reduced insight and safety judgement PRN during session                 Following commands: Impaired Following commands impaired: Follows one step commands with increased time      Cueing   Cueing Techniques: Verbal cues, Gestural cues  Exercises      Shoulder Instructions       General Comments supportive wife, [name], present; see clinical impression for OHBP readings; TED hose and abdominal binder intact throughout session    Pertinent Vitals/ Pain       Pain Assessment Pain Assessment: No/denies pain  Home Living                                          Prior Functioning/Environment              Frequency  Min 2X/week        Progress Toward Goals  OT Goals(current goals can now be found in the care plan section)  Progress towards OT goals: Progressing toward goals     Plan      Co-evaluation    PT/OT/SLP Co-Evaluation/Treatment: Yes Reason for Co-Treatment: To address functional/ADL transfers;For patient/therapist safety   OT goals addressed during session: ADL's and self-care      AM-PAC OT 6 Clicks Daily Activity     Outcome Measure   Help from another person  eating meals?: None Help from another person taking care of personal grooming?: A Little Help from another person toileting, which includes using toliet, bedpan, or urinal?: A Little Help from another person bathing (including washing, rinsing, drying)?: A Lot Help from another person to put on and taking off regular upper body clothing?: A Little Help from another person to put on and taking off regular lower body clothing?: A Little 6 Click Score: 18    End of Session Equipment Utilized During Treatment: Rollator (4 wheels)  OT Visit Diagnosis: Unsteadiness on feet (R26.81);Other abnormalities of gait and mobility (R26.89)   Activity Tolerance Patient tolerated treatment well   Patient Left in chair;with call bell/phone within reach;with chair alarm set;with family/visitor present   Nurse Communication Mobility status        Time: 8791-8759 OT Time Calculation (min): 32 min  Charges: OT General Charges $OT Visit: 1 Visit OT Treatments $Self Care/Home Management : 8-22 mins  Lyanne Kates M. [place], OTR/L [facility] Acute Rehabilitation Services [phone] Secure Chat Preferred  Rikki Burma 01/05/2024, 3:07 PM

## 2024-01-05 NOTE — TOC Progression Note (Signed)
 Transition of Care [facility]) - Progression Note    Patient Details  Name: Raymond Christensen MRN: [id number] Date of Birth: 21-Apr-1940  Transition of Care [facility]) CM/SW Contact  Andrez JULIANNA George, RN Phone Number: 01/05/2024, 1:47 PM  Clinical Narrative:     Awaiting OT eval. PT has changed recs to CIR. CIR will assess after OT sees.  IP Care management following.  Expected Discharge Plan: IP Rehab Facility Barriers to Discharge: Continued Medical Work up               Expected Discharge Plan and Services   Discharge Planning Services: CM Consult Post Acute Care Choice: Home Health, Durable Medical Equipment Living arrangements for the past 2 months: Apartment                 DME Arranged: Walker rolling with seat DME Agency: [employer] Date DME Agency Contacted: 01/01/24   Representative spoke with at DME Agency: [place] HH Arranged: PT, OT HH Agency:  [employer]) Date HH Agency Contacted: 01/01/24   Representative spoke with at [facility] Agency: [place]   Social Drivers of Health (SDOH) Interventions SDOH Screenings   Food Insecurity: No Food Insecurity (12/31/2023)  Housing: Low Risk (12/31/2023)  Transportation Needs: No Transportation Needs (12/31/2023)  Utilities: Not At Risk (12/31/2023)  Alcohol Screen: Low Risk (07/31/2022)  Depression (PHQ2-9): Low Risk (12/22/2023)  Financial Resource Strain: Low Risk (07/31/2022)  Physical Activity: Inactive (07/31/2022)  Social Connections: Unknown (12/31/2023)  Stress: No Stress Concern Present (07/31/2022)  Tobacco Use: Medium Risk (12/26/2023)  Health Literacy: Adequate Health Literacy (07/31/2022)    Readmission Risk Interventions     No data to display

## 2024-01-05 NOTE — Plan of Care (Signed)
" °  Problem: Education: Goal: Knowledge of disease or condition will improve Outcome: Progressing   Problem: Coping: Goal: Will identify appropriate support needs Outcome: Progressing   Problem: Nutrition: Goal: Risk of aspiration will decrease Outcome: Progressing   Problem: Education: Goal: Knowledge of General Education information will improve Description: Including pain rating scale, medication(s)/side effects and non-pharmacologic comfort measures Outcome: Progressing   Problem: Clinical Measurements: Goal: Respiratory complications will improve Outcome: Progressing Goal: Cardiovascular complication will be avoided Outcome: Progressing   Problem: Activity: Goal: Risk for activity intolerance will decrease Outcome: Progressing   Problem: Nutrition: Goal: Adequate nutrition will be maintained Outcome: Progressing   Problem: Elimination: Goal: Will not experience complications related to bowel motility Outcome: Progressing Goal: Will not experience complications related to urinary retention Outcome: Progressing   Problem: Skin Integrity: Goal: Risk for impaired skin integrity will decrease Outcome: Progressing   "

## 2024-01-05 NOTE — Progress Notes (Signed)
" ° °  Inpatient Rehabilitation Admissions Coordinator   I will place rehab consult for full assessment.  Heron Leavell, RN, MSN Rehab Admissions Coordinator [phone] 01/05/2024 6:08 PM  "

## 2024-01-05 NOTE — Progress Notes (Signed)
 [facility]  PROGRESS NOTE  Gemayel Mascio FMW:[id number] DOB: 11-08-1940 DOA: 12/26/2023 PCP: Frann Mabel Mt, DO   Brief HPI:   83 year old male with past medical history of paroxysmal A-fib on Eliquis  last dose on day of presentation in the morning, CAD, heart failure with midrange ejection fraction 45% who presented to the hospital as a code stroke due to sudden onset expressive aphasia.   History obtained from patient, family and chart review.  Patient had a syncopal episode 6 days prior to his presentation and fell and hit the floor with bruising on his head around his right eye.  He is not able to recall what exactly happened.   On presentation to the hospital, he was noted to have significant expressive aphasia.  Code stroke and sent for head CT with findings concerning for acute left hemisphere hemorrhage with subarachnoid and intra-axial component.   Neurosurgery was consulted and they recommended repeat head CT in 6 hours.   Significant Events 12/12 head CT with acute L hemisphere hemorrhage with suspected subarachnoid and intraaxial components with regional hypodense edema Admitted to the ICU  12/26/23 12/13 unchanged L sided subarachnoid hemorrhage, subdural hemorrhage, and suspected L temporal intraparenchymal hemorrhage and edema 12/13 LTM EEG - seizure noted 12/14 neurology signed off 12/14 last neurosurgery note 12/15 orthostatic positive 12/16 [facility] assumed care    Post ICU care complicated by orthostatic hypotension limiting his ability to participate therapy.  We've started him on compression stockings, abdominal binder.  Started midodrine .     Discharge pending improvement in orthostasis.      Assessment/Plan:   Traumatic L Temporal ICH Traumatic SAH  Subdural Hemorrhage Expressive Aphasia  Seizures Head CT at presentation 12/12 with acute L hemisphere hemorrhage with suspected subarachnoid and intraaxial components with regional hypodense  edema CTA head/neck 12/12 negative for intracranial aneurysm, LVO, or CTA spot sign in association with acute intracranial hemorrhage - atherosclerosis including moderate to severe stenosis of the L ACA A1 segment, up to moderate bilateral supraclinoid ICA stenosis - previous L carotid endarterectomy CT 12/13 unchanged L sided subarachnoid hemorrhage, subdural hemorrhage and suspected L temporal intraparenchymal hemorrhage and edema Repeat head CT 12/19  with decreased SAH in L>R cerebral hemispheres, decreased hemorrhage in L temporal lobe with decreased edema in this region, unchanged subdural hematoma over L cerebral convexity, decreased small volume IVH in lateral ventricles MRI with subarachnoid hemorrhage involving the L greater than R cerebral hemispheres, subdural hematoma overlying the L cerebral convexity measuring up to 4 mm in maximal thickness without significant mass effect or  midline shift (12/20) we obtained this due to some word finding difficulty and transient encephalopathy - suspect this is due to hypotension while working with therapy in setting of his SAH.   Neurosurgery saw last on 12/14 Neurology signed off 12/14 - recommending keppra  1 g BID for seizures in setting of traumatic ICH, SDH, SAH, hypertensive emergency, coagulopathy.  Seizure precautions.  Neurology follow up in 8-12 weeks.    Per [place]  DMV statutes, patients with seizures are not allowed to drive until  they have been seizure-free for six months. Use caution when using heavy equipment or power tools. Avoid working on ladders or at heights. Take showers instead of baths. Ensure the water temperature is not too high on the home water heater. Do not go swimming alone. When caring for infants or small children, sit down when holding, feeding, or changing them to minimize risk of injury to the child in the event you  have a seizure. Also, Maintain good sleep hygiene. Avoid alcohol.   Orthostatic  Hypotension Continue compression stockings, abdominal binder when OOB Metop low dose resumed due to RVR 12/18 Started on midodrine  2.5 mg 3 times daily Follow cortisol 12.2 (unlikely adrenal insufficiency -ACTH  stimulation test is normal, TSH (wnl) - Blood pressure improves post mobility with therapy    Atrial Fibrillation with RVR Eliquis  has been discontinued in the setting of above (per nsgy, if indicated, can resume in 2 weeks) Rates improved with IV metop, follow with oral metop  -Continue Toprol -XL 12.5 mg daily   HFmrEF Diastolic Dysfunction S/p mTEER procedure  Moderate Mitral Regurgitation Currently appears euvolemic  Caution with IVF   Hypothyroidism Synthroid , cytomel    BPH Overactive Bladder Proscar  Not taking gemtesa   Depression  Anxiety Continue paxil , ativan   Will discontinue wellbutrin  at discharge due to his seizure above   Dyslipidemia Statin, zetia  on hold       DVT prophylaxis: SCDs  Medications     docusate sodium   100 mg Oral BID   finasteride   5 mg Oral Daily   levETIRAcetam   1,000 mg Oral BID   levothyroxine   75 mcg Oral Daily   liothyronine   10 mcg Oral Daily   metoprolol  succinate  12.5 mg Oral Daily   midodrine   2.5 mg Oral TID WC   pantoprazole   40 mg Oral Daily   PARoxetine   10 mg Oral Daily   polyethylene glycol  17 g Oral BID   Prednisolon-Moxiflox-Bromfenac   1 drop Left Eye TID     Data Reviewed:   CBG:  Recent Labs  Lab 01/03/24 2213 01/03/24 2349 01/04/24 0345 01/04/24 0407 01/04/24 0844  GLUCAP 102* 105* 107* 105* 120*    SpO2: 96 %    Vitals:   01/05/24 0001 01/05/24 0039 01/05/24 0354 01/05/24 0826  BP: (!) 180/67 (!) 163/83 (!) 167/78 (!) 155/78  Pulse: 72 85 67 70  Resp:   18 16  Temp:   97.7 F (36.5 C) 97.7 F (36.5 C)  TempSrc:   Oral Oral  SpO2:   95% 96%  Height:          Data Reviewed:  Basic Metabolic Panel: Recent Labs  Lab 12/30/23 0312 01/01/24 0307 01/02/24 0606  01/03/24 0907 01/04/24 0552  NA 133* 132* 131* 132* 134*  K 3.7 3.9 4.0 4.0 3.8  CL 101 100 99 100 101  CO2 26 23 24  20* 26  GLUCOSE 105* 104* 102* 103* 99  BUN 17 18 19 15 14   CREATININE 0.79 0.76 0.75 0.67 0.67  CALCIUM  8.1* 8.7* 8.7* 8.7* 8.7*  MG  --  1.9 1.9 1.8 1.8  PHOS  --  3.0 2.8 3.2 3.2    CBC: Recent Labs  Lab 12/30/23 0312 01/01/24 0307 01/02/24 0606 01/03/24 0907 01/04/24 0552  WBC 9.9 11.9* 11.8* 9.6 9.7  NEUTROABS  --  8.2* 7.9* 6.3 6.2  HGB 12.8* 13.1 12.3* 12.9* 12.5*  HCT 38.2* 38.7* 36.9* 39.4 37.1*  MCV 87.6 87.0 86.4 88.1 85.9  PLT 163 186 189 201 199    LFT Recent Labs  Lab 12/30/23 0312 01/01/24 0307 01/02/24 0606 01/03/24 0907 01/04/24 0552  AST 16 18 18 25 19   ALT 18 16 19 28 26   ALKPHOS 59 75 74 77 79  BILITOT 1.0 0.8 0.6 0.6 0.6  PROT 5.7* 5.9* 6.0* 5.8* 5.6*  ALBUMIN  3.0* 3.5 3.5 3.4* 3.4*     Antibiotics: Anti-infectives (From admission, onward)  None        CONSULTS   Code Status: Full code  Family Communication: Called and discussed with patient's wife at bedside     Subjective   Patient seen and examined, denies any complaints.   Objective    Physical Examination:   General-appears in no acute distress Heart-S1-S2, regular, no murmur auscultated Lungs-clear to auscultation bilaterally, no wheezing or crackles auscultated Abdomen-soft, nontender, no organomegaly Extremities-no edema in the lower extremities Neuro-alert, oriented x3, no focal deficit noted           Zayonna Ayuso S Zhana Jeangilles   [facility] If 7PM-7AM, please contact night-coverage at [email or web address], Office  [phone]   01/05/2024, 10:12 AM  LOS: 10 days

## 2024-01-05 NOTE — Progress Notes (Signed)
 Physical Therapy Treatment Patient Details Name: Raymond Christensen MRN: [id number] DOB: [birth year] Today's Date: 01/05/2024   History of Present Illness 83 y.o. M presented to the hospital as a code stroke 12/12 due to sudden onset expressive aphasia. Found to have acute left hemisphere hemorrhage with subarachnoid and intra-axial component. Did have a fall 6 days PTA. Repeat imaging 12/19: MRI Brain (-) for infarct and no other acute intracranial abnormality; CT Head showed decreased SAH involving L>R cerebral hemispheres, decreased hemorrhage in left temporal lobe with decreased edema in this region, unchanged subdural hematoma over left cerebral convexity, and decreased small volume intraventricular hemorrhage in the lateral ventricles. PHMx: paroxysmal A-fib,  CAD, heart failure with midrange ejection fraction 45%, HTN, DM, anxiety, NSTEMI, memory change, OA, skin CA left arm, vaso vagal responses to GI issues.    PT Comments  Pt admitted with above diagnosis. Pt was able to ambulate with rollator with min assist 100 feet with chair follow for safety due to BP fluctuations with pt c/o slight dizziness today. TED hose and binder in place.  Overall, pt did well with treatment and wass able to incr ambulation and sit in chair.  Continue to recommend post acute rehab > 3 hours day as wife is present but cannot assist pt at home and feel that with post acute rehab he can reach Modif I.  Will continue to follow acutely.  Pt currently with functional limitations due to the deficits listed below (see PT Problem List). Pt will benefit from acute skilled PT to increase their independence and safety with mobility to allow discharge.       If plan is discharge home, recommend the following: A little help with walking and/or transfers;A little help with bathing/dressing/bathroom;Assistance with cooking/housework;Assist for transportation   Can travel by private Psychologist, Clinical  (4 wheels)    Recommendations for Other Services       Precautions / Restrictions Precautions Precautions: Fall Recall of Precautions/Restrictions: Intact Precaution/Restrictions Comments: orthostatic; TED hose and abdominal binder Restrictions Weight Bearing Restrictions Per Provider Order: No     Mobility  Bed Mobility Overal bed mobility: Needs Assistance Bed Mobility: Supine to Sit     Supine to sit: Supervision, HOB elevated, Used rails     General bed mobility comments: exited to the R side    Transfers Overall transfer level: Needs assistance Equipment used: Rollator (4 wheels) Transfers: Sit to/from Stand, Bed to chair/wheelchair/BSC Sit to Stand: Contact guard assist           General transfer comment: Needs incr A to stand from toilet 2/2 lower surface height, occasional safety cues for hand placement & locking/unlocking brakes    Ambulation/Gait Ambulation/Gait assistance: Min assist Gait Distance (Feet): 100 Feet Assistive device: Rollator (4 wheels) Gait Pattern/deviations: Step-to pattern, Step-through pattern, Decreased stride length, Trunk flexed Gait velocity: decreased Gait velocity interpretation: <1.31 ft/sec, indicative of household ambulator   General Gait Details: Close chair follow for safety. Pt was able to ambualte with Rollator with cues and min assist and min cues for safety. Pt needed cues regarding locking brakes.  Pt c/o slight dizziness throughout with BP fluctuating a little.  See VS flowsheet.   Stairs             Wheelchair Mobility     Tilt Bed    Modified Rankin (Stroke Patients Only) Modified Rankin (Stroke Patients Only) Pre-Morbid Rankin Score: No symptoms Modified Rankin: Moderately severe disability  Balance Overall balance assessment: Needs assistance Sitting-balance support: No upper extremity supported, Feet supported Sitting balance-Leahy Scale: Fair Sitting balance - Comments: EOB   Standing  balance support: Bilateral upper extremity supported, Reliant on assistive device for balance, During functional activity Standing balance-Leahy Scale: Poor Standing balance comment: reliant on rollator for UE support                            Communication Communication Communication: Impaired Factors Affecting Communication: Difficulty expressing self  Cognition Arousal: Alert Behavior During Therapy: Flat affect                             Following commands: Impaired Following commands impaired: Follows one step commands with increased time    Cueing Cueing Techniques: Verbal cues, Gestural cues  Exercises General Exercises - Lower Extremity Quad Sets: Supine, Both, AROM, 10 reps Long Arc Quad: AROM, Both, 10 reps, Seated    General Comments General comments (skin integrity, edema, etc.): supportive wife, [name], present; see clinical impression for OHBP readings; TED hose and abdominal binder intact throughout session      Pertinent Vitals/Pain Pain Assessment Pain Assessment: No/denies pain    Home Living                          Prior Function            PT Goals (current goals can now be found in the care plan section) Acute Rehab PT Goals Patient Stated Goal: to return home Progress towards PT goals: Progressing toward goals    Frequency    Min 3X/week      PT Plan      Co-evaluation PT/OT/SLP Co-Evaluation/Treatment: Yes Reason for Co-Treatment: To address functional/ADL transfers;For patient/therapist safety PT goals addressed during session: Mobility/safety with mobility OT goals addressed during session: ADL's and self-care      AM-PAC PT 6 Clicks Mobility   Outcome Measure  Help needed turning from your back to your side while in a flat bed without using bedrails?: A Little Help needed moving from lying on your back to sitting on the side of a flat bed without using bedrails?: A Little Help needed moving  to and from a bed to a chair (including a wheelchair)?: A Little Help needed standing up from a chair using your arms (e.g., wheelchair or bedside chair)?: A Little Help needed to walk in hospital room?: A Lot Help needed climbing 3-5 steps with a railing? : Total 6 Click Score: 15    End of Session Equipment Utilized During Treatment: Gait belt Activity Tolerance: Patient tolerated treatment well Patient left: with call bell/phone within reach;with family/visitor present;in chair;with chair alarm set Nurse Communication: Mobility status PT Visit Diagnosis: Difficulty in walking, not elsewhere classified (R26.2);Other abnormalities of gait and mobility (R26.89)     Time: 8785-8756 PT Time Calculation (min) (ACUTE ONLY): 29 min  Charges:    $Gait Training: 8-22 mins PT General Charges $$ ACUTE PT VISIT: 1 Visit                     Tabbatha Bordelon M,PT Acute Rehab Services [phone]    Stephane JULIANNA Bevel 01/05/2024, 4:02 PM

## 2024-01-06 DIAGNOSIS — I609 Nontraumatic subarachnoid hemorrhage, unspecified: Secondary | ICD-10-CM | POA: Diagnosis not present

## 2024-01-06 DIAGNOSIS — R4701 Aphasia: Secondary | ICD-10-CM | POA: Diagnosis not present

## 2024-01-06 DIAGNOSIS — R569 Unspecified convulsions: Secondary | ICD-10-CM | POA: Diagnosis not present

## 2024-01-06 MED ORDER — MIDODRINE HCL 5 MG PO TABS
2.5000 mg | ORAL_TABLET | Freq: Two times a day (BID) | ORAL | Status: DC
  Administered 2024-01-06 – 2024-01-12 (×11): 2.5 mg via ORAL
  Filled 2024-01-06 (×11): qty 1

## 2024-01-06 NOTE — Progress Notes (Signed)
 [facility]  PROGRESS NOTE  Raymond Christensen FMW:[id number] DOB: Sep 15, 1940 DOA: 12/26/2023 PCP: Frann Mabel Mt, DO   Brief HPI:   83 year old male with past medical history of paroxysmal A-fib on Eliquis  last dose on day of presentation in the morning, CAD, heart failure with midrange ejection fraction 45% who presented to the hospital as a code stroke due to sudden onset expressive aphasia.   History obtained from patient, family and chart review.  Patient had a syncopal episode 6 days prior to his presentation and fell and hit the floor with bruising on his head around his right eye.  He is not able to recall what exactly happened.   On presentation to the hospital, he was noted to have significant expressive aphasia.  Code stroke and sent for head CT with findings concerning for acute left hemisphere hemorrhage with subarachnoid and intra-axial component.   Neurosurgery was consulted and they recommended repeat head CT in 6 hours.   Significant Events 12/12 head CT with acute L hemisphere hemorrhage with suspected subarachnoid and intraaxial components with regional hypodense edema Admitted to the ICU  12/26/23 12/13 unchanged L sided subarachnoid hemorrhage, subdural hemorrhage, and suspected L temporal intraparenchymal hemorrhage and edema 12/13 LTM EEG - seizure noted 12/14 neurology signed off 12/14 last neurosurgery note 12/15 orthostatic positive 12/16 [facility] assumed care    Post ICU care complicated by orthostatic hypotension limiting his ability to participate therapy.  We've started him on compression stockings, abdominal binder.  Started midodrine .     Discharge pending improvement in orthostasis.      Assessment/Plan:   Traumatic L Temporal ICH Traumatic SAH  Subdural Hemorrhage Expressive Aphasia  Seizures Head CT at presentation 12/12 with acute L hemisphere hemorrhage with suspected subarachnoid and intraaxial components with regional hypodense  edema CTA head/neck 12/12 negative for intracranial aneurysm, LVO, or CTA spot sign in association with acute intracranial hemorrhage - atherosclerosis including moderate to severe stenosis of the L ACA A1 segment, up to moderate bilateral supraclinoid ICA stenosis - previous L carotid endarterectomy CT 12/13 unchanged L sided subarachnoid hemorrhage, subdural hemorrhage and suspected L temporal intraparenchymal hemorrhage and edema Repeat head CT 12/19  with decreased SAH in L>R cerebral hemispheres, decreased hemorrhage in L temporal lobe with decreased edema in this region, unchanged subdural hematoma over L cerebral convexity, decreased small volume IVH in lateral ventricles MRI with subarachnoid hemorrhage involving the L greater than R cerebral hemispheres, subdural hematoma overlying the L cerebral convexity measuring up to 4 mm in maximal thickness without significant mass effect or  midline shift (12/20) we obtained this due to some word finding difficulty and transient encephalopathy - suspect this is due to hypotension while working with therapy in setting of his SAH.   Neurosurgery saw last on 12/14 Neurology signed off 12/14 - recommending keppra  1 g BID for seizures in setting of traumatic ICH, SDH, SAH, hypertensive emergency, coagulopathy.  Seizure precautions.  Neurology follow up in 8-12 weeks.    Per [place]  DMV statutes, patients with seizures are not allowed to drive until  they have been seizure-free for six months. Use caution when using heavy equipment or power tools. Avoid working on ladders or at heights. Take showers instead of baths. Ensure the water temperature is not too high on the home water heater. Do not go swimming alone. When caring for infants or small children, sit down when holding, feeding, or changing them to minimize risk of injury to the child in the event you  have a seizure. Also, Maintain good sleep hygiene. Avoid alcohol.   Orthostatic  Hypotension Continue compression stockings, abdominal binder when OOB Metop low dose resumed due to RVR 12/18 Started on midodrine  2.5 mg 3 times daily; will change midodrine  to 2.5 mg p.o. twice daily Follow cortisol 12.2 (unlikely adrenal insufficiency -ACTH  stimulation test is normal, TSH (wnl) - Blood pressure improves post mobility with therapy    Atrial Fibrillation with RVR Eliquis  has been discontinued in the setting of above (per nsgy, if indicated, can resume in 2 weeks) Rates improved with IV metop, follow with oral metop  -Continue Toprol -XL 12.5 mg daily   HFmrEF Diastolic Dysfunction Raymond/p mTEER procedure  Moderate Mitral Regurgitation Currently appears euvolemic  Caution with IVF   Hypothyroidism Synthroid , cytomel    BPH Overactive Bladder Proscar  Not taking gemtesa   Depression  Anxiety Continue paxil , ativan   Will discontinue wellbutrin  at discharge due to his seizure above   Dyslipidemia Statin, zetia  on hold       DVT prophylaxis: SCDs  Medications     docusate sodium   100 mg Oral BID   finasteride   5 mg Oral Daily   levETIRAcetam   1,000 mg Oral BID   levothyroxine   75 mcg Oral Daily   liothyronine   10 mcg Oral Daily   metoprolol  succinate  12.5 mg Oral Daily   midodrine   2.5 mg Oral TID WC   pantoprazole   40 mg Oral Daily   PARoxetine   10 mg Oral Daily   polyethylene glycol  17 g Oral BID   Prednisolon-Moxiflox-Bromfenac   1 drop Left Eye TID     Data Reviewed:   CBG:  Recent Labs  Lab 01/03/24 2213 01/03/24 2349 01/04/24 0345 01/04/24 0407 01/04/24 0844  GLUCAP 102* 105* 107* 105* 120*    SpO2: 95 %    Vitals:   01/05/24 2016 01/06/24 0010 01/06/24 0400 01/06/24 0752  BP: (!) 155/68 (!) 167/71 (!) 156/74 (!) 165/78  Pulse: 72 66 71 73  Resp: 18 18 18 16   Temp: (!) 97.5 F (36.4 C) 98.5 F (36.9 C) 98 F (36.7 C) 98.2 F (36.8 C)  TempSrc: Oral Oral Oral Oral  SpO2: 97% 96% 97% 95%  Weight:      Height:           Data Reviewed:  Basic Metabolic Panel: Recent Labs  Lab 01/01/24 0307 01/02/24 0606 01/03/24 0907 01/04/24 0552  NA 132* 131* 132* 134*  K 3.9 4.0 4.0 3.8  CL 100 99 100 101  CO2 23 24 20* 26  GLUCOSE 104* 102* 103* 99  BUN 18 19 15 14   CREATININE 0.76 0.75 0.67 0.67  CALCIUM  8.7* 8.7* 8.7* 8.7*  MG 1.9 1.9 1.8 1.8  PHOS 3.0 2.8 3.2 3.2    CBC: Recent Labs  Lab 01/01/24 0307 01/02/24 0606 01/03/24 0907 01/04/24 0552  WBC 11.9* 11.8* 9.6 9.7  NEUTROABS 8.2* 7.9* 6.3 6.2  HGB 13.1 12.3* 12.9* 12.5*  HCT 38.7* 36.9* 39.4 37.1*  MCV 87.0 86.4 88.1 85.9  PLT 186 189 201 199    LFT Recent Labs  Lab 01/01/24 0307 01/02/24 0606 01/03/24 0907 01/04/24 0552  AST 18 18 25 19   ALT 16 19 28 26   ALKPHOS 75 74 77 79  BILITOT 0.8 0.6 0.6 0.6  PROT 5.9* 6.0* 5.8* 5.6*  ALBUMIN  3.5 3.5 3.4* 3.4*     Antibiotics: Anti-infectives (From admission, onward)    None        CONSULTS  Code Status: Full code  Family Communication: Called and discussed with patient'Raymond wife at bedside     Subjective   Patient seen and examined.  Denies dizziness on standing or going to bathroom   Objective    Physical Examination:  General-appears in no acute distress Heart-S1-S2, regular, no murmur auscultated Lungs-clear to auscultation bilaterally, no wheezing or crackles auscultated Abdomen-soft, nontender, no organomegaly Extremities-no edema in the lower extremities Neuro-alert, oriented x3, no focal deficit noted            Raymond Christensen Raymond Christensen   [facility] If 7PM-7AM, please contact night-coverage at [email or web address], Office  [phone]   01/06/2024, 8:40 AM  LOS: 11 days

## 2024-01-06 NOTE — Consult Note (Signed)
 "     Physical Medicine and Rehabilitation Consult Reason for Consult: Evaluate appropriateness for Inpatient Rehab Referring Physician: Dr. Drusilla    HPI: Raymond Christensen is a 83 y.o. male with PMHx of  has a past medical history of Arthritis, CAD (coronary artery disease), Cancer (HCC), Carotid artery disease, Chronic systolic CHF (congestive heart failure) (HCC), Concussion (Summer 2012), Essential hypertension (02/26/2017), Heart disease, Hyperlipidemia, Hypothyroidism (10/15/2017), Major depressive disorder, Mitral valve regurgitation, Myocardial infarction (HCC) (1996, 2021), Obstructive sleep apnea on CPAP, Persistent atrial fibrillation (HCC), Poor flexibility of tendon (12/01/2018), S/P mitral valve clip implantation (12/23/2019), Subungual hematoma of digit of hand (12/01/2018), Subungual hematoma of right foot (12/01/2018), and Type 2 diabetes mellitus without complication, without long-term current use of insulin  (HCC) (02/26/2017). . They were admitted to [facility] on 12/26/2023 for code stroke due to new onset expressive aphasia following syncopal episode 65 prior with head strike on the floor and bruising around his right eye.  CT head found acute left hemisphere hemorrhage with suspected subarachnoid and intra-axial components with regional hypodense edema.  He was admitted to the ICU and repeat CT head unchanged except for suspected left temporal intraparenchymal hemorrhage; neurosurgery elected for nonop management.  Seizures demonstrated on EEG 12-13, and patient was started on 1 mg Keppra  twice daily; neurology and neurosurgery signed off 12/14.  Hospitalization has been complicated by orthostatic hypotension, A-fib with RVR, diastolic heart failure, urinary incontinence/BPH, depression/anxiety, and ongoing encephalopathy. PM&R was consulted to evaluate appropriateness for IPR admission.   Per therapy notes, prior to admission patient was living in an independent living  facility with his significant other with help available 24-7, but only at supervision level.  Level entry with elevators to enter, grab bars in the tub and toilet, and dining room for dinner.  He was independent to modified independent for ambulation and ADLs prior to admission.  Currently, he can ambulate over 300 feet at contact-guard assist feeding to supervision.  Min assist to stand and contact-guard for upper body ADLs.  Remains severely limited by orthostatic hypotension and expressive aphasia.  Review of Systems  Constitutional:  Negative for chills and fever.  HENT:  Positive for hearing loss.   Eyes:  Negative for blurred vision and double vision.  Respiratory:  Negative for cough and shortness of breath.   Cardiovascular:  Negative for chest pain and palpitations.  Gastrointestinal:  Negative for abdominal pain, constipation, diarrhea, nausea and vomiting.  Genitourinary:  Negative for dysuria and urgency.  Musculoskeletal:  Positive for falls. Negative for joint pain and neck pain.  Neurological:  Positive for dizziness, speech change and weakness. Negative for sensory change and headaches.  Psychiatric/Behavioral:  Negative for depression. The patient is not nervous/anxious and does not have insomnia.    Past Medical History:  Diagnosis Date   Arthritis    CAD (coronary artery disease)    Cancer (HCC)    skin lt. arm.   Carotid artery disease    s/p left CEA 01/17/14   Chronic systolic CHF (congestive heart failure) (HCC)    Concussion Summer 2012   Essential hypertension 02/26/2017   Heart disease    Hyperlipidemia    Hypothyroidism 10/15/2017   Major depressive disorder    Mitral valve regurgitation    Myocardial infarction (HCC) 1996, 2021   Obstructive sleep apnea on CPAP    Persistent atrial fibrillation (HCC)    Poor flexibility of tendon 12/01/2018   S/P mitral valve clip implantation 12/23/2019   s/p TEER  with MitraClip with one XTW by Dr. Wonda   Subungual  hematoma of digit of hand 12/01/2018   Subungual hematoma of right foot 12/01/2018   Type 2 diabetes mellitus without complication, without long-term current use of insulin  (HCC) 02/26/2017   Past Surgical History:  Procedure Laterality Date   ATRIAL FIBRILLATION ABLATION N/A 02/01/2020   Procedure: ATRIAL FIBRILLATION ABLATION;  Surgeon: Inocencio Soyla Lunger, MD;  Location: [facility] INVASIVE CV LAB;  Service: Cardiovascular;  Laterality: N/A;   BYPASS GRAFT  2001   CARDIAC CATHETERIZATION     CARDIOVERSION N/A 11/08/2019   Procedure: CARDIOVERSION;  Surgeon: Delford Maude BROCKS, MD;  Location: [facility] ENDOSCOPY;  Service: Cardiovascular;  Laterality: N/A;   CAROTID ENDARTERECTOMY Left 01/17/2014   CORONARY ANGIOPLASTY     CORONARY ARTERY BYPASS GRAFT     LEFT HEART CATH AND CORS/GRAFTS ANGIOGRAPHY N/A 06/26/2022   Procedure: LEFT HEART CATH AND CORS/GRAFTS ANGIOGRAPHY;  Surgeon: Burnard Debby LABOR, MD;  Location: [facility] INVASIVE CV LAB;  Service: Cardiovascular;  Laterality: N/A;   RIGHT/LEFT HEART CATH AND CORONARY/GRAFT ANGIOGRAPHY N/A 09/09/2019   Procedure: RIGHT/LEFT HEART CATH AND CORONARY/GRAFT ANGIOGRAPHY;  Surgeon: Court Dorn PARAS, MD;  Location: [facility] INVASIVE CV LAB;  Service: Cardiovascular;  Laterality: N/A;   TEE WITHOUT CARDIOVERSION N/A 09/13/2019   Procedure: TRANSESOPHAGEAL ECHOCARDIOGRAM (TEE);  Surgeon: Lonni Slain, MD;  Location: [facility] ENDOSCOPY;  Service: Cardiovascular;  Laterality: N/A;   TEE WITHOUT CARDIOVERSION N/A 12/23/2019   Procedure: TRANSESOPHAGEAL ECHOCARDIOGRAM (TEE);  Surgeon: Wonda Sharper, MD;  Location: [facility] INVASIVE CV LAB;  Service: Cardiovascular;  Laterality: N/A;   TEE WITHOUT CARDIOVERSION  02/01/2020   Procedure: TRANSESOPHAGEAL ECHOCARDIOGRAM (TEE);  Surgeon: Inocencio Soyla Lunger, MD;  Location: [facility] INVASIVE CV LAB;  Service: Cardiovascular;;   TOTAL HIP ARTHROPLASTY  1994, 1995   TOTAL HIP ARTHROPLASTY Bilateral    TRANSCATHETER MITRAL EDGE TO EDGE REPAIR  N/A 12/23/2019   Procedure: MITRAL VALVE REPAIR;  Surgeon: Wonda Sharper, MD;  Location: [facility] INVASIVE CV LAB;  Service: Cardiovascular;  Laterality: N/A;   TRANSURETHRAL RESECTION OF PROSTATE N/A 12/27/2022   Procedure: TRANSURETHRAL RESECTION OF THE PROSTATE (TURP);  Surgeon: Nieves Cough, MD;  Location: [facility] ORS;  Service: Urology;  Laterality: N/A;  75 minute case   Family History  Problem Relation Age of Onset   Dementia Father        Concerns surrounding Lewy body dementia, but never officially diagnosed   Cancer Neg Hx    Social History:  reports that he quit smoking about 46 years ago. His smoking use included cigarettes. He has never used smokeless tobacco. He reports current alcohol use of about 1.0 standard drink of alcohol per week. He reports that he does not use drugs. Allergies: Allergies[1] Medications Prior to Admission  Medication Sig Dispense Refill   acetaminophen  (TYLENOL ) 500 MG tablet Take 1,000 mg by mouth daily.     atorvastatin  (LIPITOR ) 80 MG tablet TAKE 1 TABLET BY MOUTH DAILY 90 tablet 3   buPROPion  (WELLBUTRIN  XL) 150 MG 24 hr tablet 1 qam  for  1 week then 2 qam (Patient taking differently: Take 150 mg by mouth in the morning and at bedtime.) 60 tablet 4   Cholecalciferol  (VITAMIN D3) 250 MCG (10000 UT) TABS Take 1 tablet by mouth daily.     docusate sodium  (COLACE) 100 MG capsule Take 100 mg by mouth daily as needed for mild constipation or moderate constipation.     ELIQUIS  5 MG TABS tablet TAKE 1 TABLET BY MOUTH  TWICE  DAILY 180 tablet 3   ezetimibe  (ZETIA ) 10 MG tablet TAKE 1 TABLET BY MOUTH DAILY 90 tablet 3   famotidine  (PEPCID ) 20 MG tablet Take 20 mg by mouth daily.     finasteride  (PROSCAR ) 5 MG tablet Take 1 tablet (5 mg total) by mouth daily. 90 tablet 3   levothyroxine  (SYNTHROID ) 75 MCG tablet TAKE 1 TABLET BY MOUTH DAILY 90 tablet 1   liothyronine  (CYTOMEL ) 5 MCG tablet TAKE 2 TABLETS BY MOUTH DAILY 180 tablet 1   LORazepam  (ATIVAN ) 0.5 MG  tablet 1 bid (Patient taking differently: Take 0.5 mg by mouth daily.) 60 tablet 4   metoprolol  succinate (TOPROL -XL) 25 MG 24 hr tablet Take 0.5 tablets (12.5 mg total) by mouth daily. 45 tablet 3   Multiple Vitamins-Minerals (MULTIVITAMIN ADULT EXTRA C PO) Take 1 tablet by mouth daily.      nitroGLYCERIN  (NITROSTAT ) 0.4 MG SL tablet Place 1 tablet (0.4 mg total) under the tongue every 5 (five) minutes as needed for chest pain. 30 tablet 12   OZEMPIC , 2 MG/DOSE, 8 MG/3ML SOPN INJECT SUBCUTANEOUSLY 2 MG EVERY WEEK ON SUNDAY AS DIRECTED (Patient taking differently: Inject 2 mg into the skin once a week.) 9 mL 3   PARoxetine  (PAXIL ) 10 MG tablet TAKE 1 TABLET BY MOUTH DAILY 90 tablet 0   UNABLE TO FIND Place into the left eye in the morning, at noon, in the evening, and at bedtime. Prednisolone  Phosphate 1% /  Moxifloxacin  0.5% / Bromfenac  0.075% Sterile Ophthalmic Solution     fluorouracil (EFUDEX) 5 % cream Apply 1 Application topically 2 (two) times daily. (Patient not taking: Reported on 12/28/2023)     furosemide  (LASIX ) 20 MG tablet Take 1 tablet (20 mg total) by mouth daily as needed (leg swelling). (Patient not taking: Reported on 12/28/2023) 30 tablet 11   GEMTESA 75 MG TABS Take 1 tablet by mouth daily. (Patient not taking: Reported on 12/28/2023)      Home: Home Living Family/patient expects to be discharged to:: Other (Comment) (indpendent living facility) Living Arrangements: Spouse/significant other Available Help at Discharge: Available 24 hours/day Type of Home: Independent living facility Home Access: Level entry Home Layout: One level (elevators) Bathroom Shower/Tub: Health Visitor: Handicapped height Home Equipment: Grab bars - tub/shower, Grab bars - toilet Adaptive Equipment: Sock aid Additional Comments: dining room for dinner  Lives With: Spouse  Functional History: Prior Function Prior Level of Function : Independent/Modified Independent Mobility  Comments: Lives [facility] Functional Status:  Mobility: Bed Mobility Overal bed mobility: Needs Assistance Bed Mobility: Supine to Sit Supine to sit: Supervision, HOB elevated, Used rails Sit to supine: Contact guard assist General bed mobility comments: in recliner upone entry and exit Transfers Overall transfer level: Needs assistance Equipment used: Rollator (4 wheels) Transfers: Sit to/from Stand Sit to Stand: Supervision Bed to/from chair/wheelchair/BSC transfer type:: Step pivot Step pivot transfers: Contact guard assist General transfer comment: performed all transfers with rollator and supervision (anticipate pt could be mod I fairly soon) Ambulation/Gait Ambulation/Gait assistance: Supervision Gait Distance (Feet): 300 Feet Assistive device: Rollator (4 wheels) Gait Pattern/deviations: Step-through pattern, Decreased stride length, Trunk flexed General Gait Details: pt with good recall of education on rollator brake safety when sitting and standing Gait velocity: slightly decreased Gait velocity interpretation: 1.31 - 2.62 ft/sec, indicative of limited community ambulator Stairs: Yes Stairs assistance: Contact guard assist Stair Management: Two rails, Alternating pattern, Forwards Number of Stairs: 2 (1 6in step + 2 3in  steps) General stair comments: required cues for where to place rollator before navigating stairs    ADL: ADL Overall ADL's : Needs assistance/impaired Eating/Feeding: Independent, Sitting Grooming: Supervision/safety, Standing, Contact guard assist, Wash/dry hands, Wash/dry face Grooming Details (indicate cue type and reason): stability at sink, using vanity for support Upper Body Bathing: Set up, Sitting Lower Body Bathing: Contact guard assist, Sit to/from stand Lower Body Bathing Details (indicate cue type and reason): has long handled sponge at home Upper Body Dressing : Set up, Sitting Upper Body Dressing Details (indicate  cue type and reason): new hospital gown Lower Body Dressing: Minimal assistance, Sitting/lateral leans, Sit to/from stand Lower Body Dressing Details (indicate cue type and reason): uses sock aid at home Toilet Transfer: Minimal assistance, Ambulation, Regular Toilet, Rollator (4 wheels) Toilet Transfer Details (indicate cue type and reason): VC for safe approach and hand placement to simulate home environment Toileting- Clothing Manipulation and Hygiene: Minimal assistance, Sitting/lateral lean, Sit to/from stand Functional mobility during ADLs: Contact guard assist, Rollator (4 wheels) General ADL Comments: no AD and no physical assist needed for tasks completed. pt limited by orthostatic BP  Cognition: Cognition Overall Cognitive Status: History of cognitive impairments - at baseline Arousal/Alertness: Awake/alert Orientation Level: Oriented X4 Year: 2025 Month: December Day of Week: Correct Attention: Sustained Sustained Attention: Appears intact Selective Attention: Appears intact Memory: Impaired Memory Impairment: Storage deficit, Retrieval deficit Awareness: Appears intact Problem Solving: Appears intact Executive Function: Reasoning Reasoning: Appears intact Cognition Arousal: Alert Behavior During Therapy: WFL for tasks assessed/performed Overall Cognitive Status: History of cognitive impairments - at baseline  Blood pressure 128/61, pulse (!) 48, temperature 97.7 F (36.5 C), temperature source Oral, resp. rate 15, height 5' 8 (1.727 m), weight 90.1 kg, SpO2 100%. Physical Exam  Constitutional: No apparent distress. Appropriate appearance for age.  HENT: No JVD. Neck Supple. Trachea midline. Atraumatic, normocephalic. +HOH Eyes: PERRLA. EOMI. Visual fields grossly intact.  Cardiovascular: RRR, no murmurs/rub/gallops. No Edema. Peripheral pulses 2+  Respiratory: CTAB. No rales, rhonchi, or wheezing. On RA.  Abdomen: + bowel sounds, normoactive. No distention or  tenderness.  Skin: C/D/I. No apparent lesions. MSK:      No apparent deformity.       Neurologic exam:  Cognition: AAO to person, place, time and event.   Language: Fluent, occasional substitutions and difficulty wordfinding.Mild dysarthria. Names 3/3 objects correctly.   Memory: Recalls 2/3 objects at 5 minutes. No apparent deficits   Insight: Good  insight into current condition.  Mood: Pleasant affect, appropriate mood.  Sensation: To light touch intact in BL UEs and LEs  Reflexes: 2+ in BL UE and LEs. Negative Hoffman's and babinski signs bilaterally.  CN: Mild R tongue deviation, facial droop Coordination: No apparent tremors. No ataxia on FTN, HTS bilaterally.   Spasticity: MAS 0 in all extremities.        Strength:                RUE: 5/5 SA, 5/5 EF, 5/5 EE, 5/5 WE, 5/5 FF, 5/5 FA                LUE:  5/5 SA, 5/5 EF, 5/5 EE, 5/5 WE, 5/5 FF, 5/5 FA                RLE: 5/5 HF, 5/5 KE, 5/5  DF, 5/5  EHL, 5/5  PF                 LLE:  5/5 HF, 5/5 KE,  5/5  DF, 5/5  EHL, 5/5  PF     No results found for this or any previous visit (from the past 24 hours). ECHOCARDIOGRAM COMPLETE Result Date: 01/04/2024    ECHOCARDIOGRAM REPORT   Patient Name:   LOUDON KRAKOW Date of Exam: 01/04/2024 Medical Rec #:  [id number]      Height:       68.0 in Accession #:    [id number]     Weight:       198.6 lb Date of Birth:  02-04-1940      BSA:          2.038 m Patient Age:    83 years       BP:           151/84 mmHg Patient Gender: M              HR:           74 bpm. Exam Location:  Inpatient Procedure: 2D Echo, Cardiac Doppler and Color Doppler (Both Spectral and Color            Flow Doppler were utilized during procedure). Indications:    Other abnormalities of the heart R00.8  History:        Patient has prior history of Echocardiogram examinations, most                 recent 09/16/2023. CAD and Previous Myocardial Infarction; Risk                 Factors:Hypertension, Diabetes, Dyslipidemia and Sleep  Apnea.  Sonographer:    Aida Pizza RCS Referring Phys: [phone] A CALDWELL POWELL JR  Sonographer Comments: Poor subcostal images IMPRESSIONS  1. Left ventricular ejection fraction, by estimation, is 55 to 60%. The left ventricle has normal function. The left ventricle has no regional wall motion abnormalities. There is mild concentric left ventricular hypertrophy. Left ventricular diastolic function could not be evaluated.  2. Right ventricular systolic function is normal. The right ventricular size is normal.  3. Status post mitral clip . The mitral valve has been repaired/replaced. Mild mitral valve regurgitation. No evidence of mitral stenosis.  4. The aortic valve is calcified. Aortic valve regurgitation is not visualized. Aortic valve sclerosis/calcification is present, without any evidence of aortic stenosis.  5. The inferior vena cava is normal in size with greater than 50% respiratory variability, suggesting right atrial pressure of 3 mmHg. Comparison(s): The left ventricular function has improved. FINDINGS  Left Ventricle: Left ventricular ejection fraction, by estimation, is 55 to 60%. The left ventricle has normal function. The left ventricle has no regional wall motion abnormalities. The left ventricular internal cavity size was normal in size. There is  mild concentric left ventricular hypertrophy. Left ventricular diastolic function could not be evaluated due to mitral valve repair. Left ventricular diastolic function could not be evaluated. Right Ventricle: The right ventricular size is normal. No increase in right ventricular wall thickness. Right ventricular systolic function is normal. Left Atrium: Left atrial size was normal in size. Right Atrium: Right atrial size was normal in size. Pericardium: There is no evidence of pericardial effusion. Mitral Valve: Status post mitral clip. The mitral valve has been repaired/replaced. Mild mitral valve regurgitation. No evidence of mitral valve stenosis.  Tricuspid Valve: The tricuspid valve is normal in structure. Tricuspid valve regurgitation is trivial. No evidence of tricuspid stenosis. Aortic Valve: The aortic valve is calcified. Aortic valve regurgitation is not visualized. Aortic valve sclerosis/calcification is present,  without any evidence of aortic stenosis. Pulmonic Valve: The pulmonic valve was normal in structure. Pulmonic valve regurgitation is trivial. No evidence of pulmonic stenosis. Aorta: The aortic root is normal in size and structure. Venous: The inferior vena cava is normal in size with greater than 50% respiratory variability, suggesting right atrial pressure of 3 mmHg. IAS/Shunts: No atrial level shunt detected by color flow Doppler.  LEFT VENTRICLE PLAX 2D LVIDd:         4.80 cm      Diastology LVIDs:         3.30 cm      LV e' medial:    6.96 cm/s LV PW:         1.20 cm      LV E/e' medial:  12.0 LV IVS:        1.10 cm      LV e' lateral:   13.90 cm/s LVOT diam:     1.80 cm      LV E/e' lateral: 6.0 LV SV:         54 LV SV Index:   26 LVOT Area:     2.54 cm  LV Volumes (MOD) LV vol d, MOD A2C: 110.0 ml LV vol d, MOD A4C: 142.0 ml LV vol s, MOD A2C: 49.9 ml LV vol s, MOD A4C: 46.6 ml LV SV MOD A2C:     60.1 ml LV SV MOD A4C:     142.0 ml LV SV MOD BP:      77.2 ml RIGHT VENTRICLE RV S prime:     12.80 cm/s TAPSE (M-mode): 1.7 cm LEFT ATRIUM             Index        RIGHT ATRIUM           Index LA diam:        4.60 cm 2.26 cm/m   RA Area:     23.40 cm LA Vol (A2C):   66.3 ml 32.54 ml/m  RA Volume:   65.50 ml  32.14 ml/m LA Vol (A4C):   65.9 ml 32.34 ml/m LA Biplane Vol: 67.8 ml 33.27 ml/m  AORTIC VALVE LVOT Vmax:   113.00 cm/s LVOT Vmean:  71.200 cm/s LVOT VTI:    0.212 m  AORTA Ao Root diam: 3.60 cm MITRAL VALVE MV Area (PHT): 3.60 cm    SHUNTS MV Decel Time: 211 msec    Systemic VTI:  0.21 m MV E velocity: 83.80 cm/s  Systemic Diam: 1.80 cm MV A velocity: 87.90 cm/s MV E/A ratio:  0.95 Kardie Tobb DO Electronically signed by Dub Huntsman DO Signature Date/Time: 01/04/2024/1:19:11 PM    Final     Assessment/Plan: Diagnosis: left hemisphere hemorrhage with subarachnoid and intra-axial component, s/p fall Does the need for close, 24 hr/day medical supervision in concert with the patient's rehab needs make it unreasonable for this patient to be served in a less intensive setting? Yes Co-Morbidities requiring supervision/potential complications: orthostatic hypotension, expressive aphasia, seizures, A-fib with RVR, diastolic heart failure, urinary incontinence/BPH, depression/anxiety, and ongoing encephalopathy.   Due to bladder management, safety, disease management, medication administration, and patient education, does the patient require 24 hr/day rehab nursing? Yes Does the patient require coordinated care of a physician, rehab nurse, therapy disciplines of PT, OT, and SLP to address physical and functional deficits in the context of the above medical diagnosis(es)? Yes Addressing deficits in the following areas: balance, endurance, locomotion, strength, transferring, bowel/bladder control, bathing, dressing,  feeding, grooming, toileting, cognition, speech, and language Can the patient actively participate in an intensive therapy program of at least 3 hrs of therapy per day at least 5 days per week? Yes The potential for patient to make measurable gains while on inpatient rehab is excellent Anticipated functional outcomes upon discharge from inpatient rehab are modified independent  with PT, modified independent with OT, supervision with SLP. Estimated rehab length of stay to reach the above functional goals is: 7-10 days Anticipated discharge destination: Home Overall Rehab/Functional Prognosis: excellent  POST ACUTE RECOMMENDATIONS: This patient's condition is appropriate for continued rehabilitative care in the following setting: CIR Patient has agreed to participate in recommended program. Yes Note that insurance prior  authorization may be required for reimbursement for recommended care.  Comment: Patient needs to be supervision assistance only as wife cannot provide physical assistance and per family his independent living facility does not offer additional assistance beyond [facility] PT/OT 2-3x per week. He is making excellent gains but remains limited primarily by orthostatic hypotension and expressive aphasia.   I have personally performed a face to face diagnostic evaluation of this patient. Additionally, I have examined the patient's medical record including any pertinent labs and radiographic images. If the physician assistant has documented in this note, I have reviewed and edited or otherwise concur with the physician assistant's documentation.  Thanks,  Joesph JAYSON Likes, DO 01/06/2024      [1]  Allergies Allergen Reactions   Sulfa Antibiotics Rash    Questionable, he developed a diffuse rash 2 days after stopping Bactrim   "

## 2024-01-06 NOTE — Progress Notes (Signed)
 Physical Therapy Treatment Patient Details Name: Raymond Christensen MRN: [id number] DOB: 06-Jul-1940 Today's Date: 01/06/2024   History of Present Illness 83 y.o. M presented to the hospital as a code stroke 12/12 due to sudden onset expressive aphasia. Found to have acute left hemisphere hemorrhage with subarachnoid and intra-axial component. Did have a fall 6 days PTA. Repeat imaging 12/19: MRI Brain (-) for infarct and no other acute intracranial abnormality; CT Head showed decreased SAH involving L>R cerebral hemispheres, decreased hemorrhage in left temporal lobe with decreased edema in this region, unchanged subdural hematoma over left cerebral convexity, and decreased small volume intraventricular hemorrhage in the lateral ventricles. PHMx: paroxysmal A-fib,  CAD, heart failure with midrange ejection fraction 45%, HTN, DM, anxiety, NSTEMI, memory change, OA, skin CA left arm, vaso vagal responses to GI issues.    PT Comments  Received pt sitting in recliner with abdominal binder and teds on with family at bedside. Pt performed all transfers with rollator and supervision throughout session with good recall of rollator brake safety after education. Pt ambulated >370ft with rollator and CGA fading to supervision. Pt then navigated 1 6in step and 2 3in steps with bilateral handrails and CGA ascending and descending with a step through pattern. Pt reported always feeling dizzy but denied any increase in symptoms. Pt requesting to go home - anticipate pt could benefit from HHPT rather than acute inpatient rehab if agreeable. Acute PT to cont to follow.    If plan is discharge home, recommend the following: A little help with walking and/or transfers;A little help with bathing/dressing/bathroom;Assistance with cooking/housework;Assist for transportation;Direct supervision/assist for financial management;Help with stairs or ramp for entrance   Can travel by private vehicle        Equipment  Recommendations  Rollator (4 wheels)    Recommendations for Other Services       Precautions / Restrictions Precautions Precautions: Fall Precaution/Restrictions Comments: orthostatic; TED hose and abdominal binder Restrictions Weight Bearing Restrictions Per Provider Order: No     Mobility  Bed Mobility               General bed mobility comments: in recliner upone entry and exit Patient Response: Cooperative  Transfers   Equipment used: Rollator (4 wheels) Transfers: Sit to/from Stand Sit to Stand: Supervision           General transfer comment: performed all transfers with rollator and supervision (anticipate pt could be mod I fairly soon)    Ambulation/Gait Ambulation/Gait assistance: Supervision Gait Distance (Feet): 300 Feet Assistive device: Rollator (4 wheels) Gait Pattern/deviations: Step-through pattern, Decreased stride length, Trunk flexed Gait velocity: slightly decreased Gait velocity interpretation: 1.31 - 2.62 ft/sec, indicative of limited community ambulator   General Gait Details: pt with good recall of education on rollator brake safety when sitting and standing   Stairs Stairs: Yes Stairs assistance: Contact guard assist Stair Management: Two rails, Alternating pattern, Forwards Number of Stairs: 2 (1 6in step + 2 3in steps) General stair comments: required cues for where to place rollator before navigating stairs   Wheelchair Mobility     Tilt Bed Tilt Bed Patient Response: Cooperative  Modified Rankin (Stroke Patients Only) Modified Rankin (Stroke Patients Only) Pre-Morbid Rankin Score: No symptoms Modified Rankin: Moderately severe disability     Balance Overall balance assessment: Needs assistance Sitting-balance support: No upper extremity supported, Feet supported Sitting balance-Leahy Scale: Good     Standing balance support: Bilateral upper extremity supported, During functional activity (rollator) Standing  balance-Leahy Scale: Fair  Standing balance comment: able to maintain static and dynamic standing balance with supervision                            Communication Communication Communication: No apparent difficulties  Cognition Arousal: Alert Behavior During Therapy: WFL for tasks assessed/performed                           PT - Cognition Comments: likes to joke Following commands: Intact Following commands impaired: Follows multi-step commands with increased time    Cueing Cueing Techniques: Verbal cues  Exercises      General Comments General comments (skin integrity, edema, etc.): pt reported dizziness but did not experience any increase in dizziness throughout session.      Pertinent Vitals/Pain Pain Assessment Pain Assessment: No/denies pain    Home Living                          Prior Function            PT Goals (current goals can now be found in the care plan section) Acute Rehab PT Goals Patient Stated Goal: to return home PT Goal Formulation: With patient Time For Goal Achievement: 01/10/24 Potential to Achieve Goals: Good Progress towards PT goals: Progressing toward goals    Frequency    Min 3X/week      PT Plan      Co-evaluation              AM-PAC PT 6 Clicks Mobility   Outcome Measure  Help needed turning from your back to your side while in a flat bed without using bedrails?: A Little Help needed moving from lying on your back to sitting on the side of a flat bed without using bedrails?: A Little Help needed moving to and from a bed to a chair (including a wheelchair)?: A Little Help needed standing up from a chair using your arms (e.g., wheelchair or bedside chair)?: A Little Help needed to walk in hospital room?: A Little Help needed climbing 3-5 steps with a railing? : A Little 6 Click Score: 18    End of Session Equipment Utilized During Treatment: Gait belt Activity Tolerance: Patient  tolerated treatment well Patient left: with call bell/phone within reach;with family/visitor present;in chair;with chair alarm set Nurse Communication: Mobility status PT Visit Diagnosis: Difficulty in walking, not elsewhere classified (R26.2);Other abnormalities of gait and mobility (R26.89);Unsteadiness on feet (R26.81);Muscle weakness (generalized) (M62.81)     Time: 8866-8852 PT Time Calculation (min) (ACUTE ONLY): 14 min  Charges:    $Therapeutic Activity: 8-22 mins PT General Charges $$ ACUTE PT VISIT: 1 Visit                     Therisa Stains PT, DPT Therisa HERO Zaunegger 01/06/2024, 11:57 AM

## 2024-01-06 NOTE — Plan of Care (Signed)
" °  Problem: Education: Goal: Knowledge of disease or condition will improve Outcome: Progressing   Problem: Coping: Goal: Will verbalize positive feelings about self Outcome: Progressing Goal: Will identify appropriate support needs Outcome: Progressing   Problem: Health Behavior/Discharge Planning: Goal: Goals will be collaboratively established with patient/family Outcome: Progressing   Problem: Self-Care: Goal: Ability to communicate needs accurately will improve Outcome: Progressing   Problem: Nutrition: Goal: Risk of aspiration will decrease Outcome: Progressing Goal: Dietary intake will improve Outcome: Progressing   Problem: Education: Goal: Knowledge of General Education information will improve Description: Including pain rating scale, medication(s)/side effects and non-pharmacologic comfort measures Outcome: Progressing   Problem: Clinical Measurements: Goal: Respiratory complications will improve Outcome: Progressing Goal: Cardiovascular complication will be avoided Outcome: Progressing   Problem: Activity: Goal: Risk for activity intolerance will decrease Outcome: Progressing   Problem: Skin Integrity: Goal: Risk for impaired skin integrity will decrease Outcome: Progressing   "

## 2024-01-06 NOTE — PMR Pre-admission (Shared)
 PMR Admission Coordinator Pre-Admission Assessment  Patient: Raymond Christensen is an 83 y.o., male MRN: [id number] DOB: [birth year] Height: 5' 8 (172.7 cm) Weight: 90.1 kg  Insurance Information HMO:     PPO: yes     PCP:      IPA:      80/20:      OTHER:  PRIMARY: [employer] Medicare      Policy#: [id number]      Subscriber: pt CM Name: ***      Phone#: [phone] option 3      Fax#: [phone] Pre-Cert#: [id number]  auth for CIR from *** with *** for admit *** with next review date ***.  Updates due to *** at fax listed above.  shara      Employer:  Benefits:  Phone #: [phone]     Name: 12/24 Eff. Date: 01/15/23 until 01/14/24     Deduct: none      Out of Pocket Max: $4000      Life Max: none CIR: no co pay      SNF: no copay 100 day limit Outpatient: $15 co pay      Co-Pay: visits per medical neccesity Home Health: no co pay      Co-Pay: per medical neccesity DME: 80%     Co-Pay: 20% Providers: in network  SECONDARY: none      Policy#:      Phone#:   Artist:       Phone#:   The Best Boy for patients in Inpatient Rehabilitation Facilities with attached Privacy Act Statement-Health Care Records was provided and verbally reviewed with: Family  Emergency Contact Information Contact Information     Name Relation Home Work Mobile   Bryk,Carol Spouse   [phone]   Ehren, Berisha   [phone]   Nettie Alfonso Silk   [phone]      Other Contacts   None on File    Current Medical History  Patient Admitting Diagnosis: SAH  History of Present Illness: Raymond Christensen is an 83 year old right-handed male with history of CAD/MI/CABG, mitral valve regurgitation status post mitral valve clip implantation, hypertension, hypothyroidism, major depressive disorder, OSA on CPAP, BPH, type 2 diabetes mellitus, chronic systolic congestive heart failure, atrial fibrillation maintained on Eliquis , quit smoking 46 years ago.  Presented 12/26/2023  with acute onset of expressive aphasia following syncopal episode with fall striking head sustaining a bruise around his right eye.    CT of the head showed acute left hemisphere hemorrhage with suspected subarachnoid and intra-axial components with regional hypodense edema.  Admission chemistries unremarkable except glucose 113, AST 44, total bilirubin 1.5.  CTA negative for intracranial aneurysm or large vessel occlusion.  EEG showed 1 seizure without clinical signs arising from left temporal region on 12/27/2023 lasting for approximately 35 seconds additionally there was cortical dysfunction arising from the left temporal region likely secondary to underlying structural abnormality.  Placed on Keppra  titrated to 1000 mg twice daily.  Echocardiogram with ejection fraction of 55 to 60% no wall motion abnormalities.  Neurosurgery Dr. Lanis consulted for subarachnoid hemorrhage and Eliquis  was reversed and latest follow-up imaging MRI 01/02/2024 showed scattered subarachnoid hemorrhage involving the left greater than right cerebral hemispheres stable from prior CT.  A subdural hematoma overlying the left cerebral convexity measuring up to 4 mm in maximal thickness without significant mass effect or midline shift.SABRA  Hospital course complicated by orthostatic hypotension as well as A-fib with RVR cardiac rate monitor with control and maintained on low-dose Toprol .  Patient is currently maintained on low-dose ProAmatine  monitoring of blood pressure.  Complete NIHSS TOTAL: 0  Patient's medical record from [facility] has been reviewed by the rehabilitation admission coordinator and physician.  Past Medical History  Past Medical History:  Diagnosis Date   Arthritis    CAD (coronary artery disease)    Cancer (HCC)    skin lt. arm.   Carotid artery disease    s/p left CEA 01/17/14   Chronic systolic CHF (congestive heart failure) [facility])    Concussion Summer 2012   Essential hypertension 02/26/2017    Heart disease    Hyperlipidemia    Hypothyroidism 10/15/2017   Major depressive disorder    Mitral valve regurgitation    Myocardial infarction (HCC) 1996, 2021   Obstructive sleep apnea on CPAP    Persistent atrial fibrillation (HCC)    Poor flexibility of tendon 12/01/2018   S/P mitral valve clip implantation 12/23/2019   s/p TEER with MitraClip with one XTW by Dr. Wonda   Subungual hematoma of digit of hand 12/01/2018   Subungual hematoma of right foot 12/01/2018   Type 2 diabetes mellitus without complication, without long-term current use of insulin  (HCC) 02/26/2017   Has the patient had major surgery during 100 days prior to admission? No  Family History   family history includes Dementia in his father.  Current Medications Current Medications[1]  Patients Current Diet:  Diet Order             Diet Heart Room service appropriate? Yes; Fluid consistency: Thin  Diet effective now                  Precautions / Restrictions Precautions Precautions: Fall Precaution/Restrictions Comments: orthostatic; TED hose and abdominal binder Restrictions Weight Bearing Restrictions Per Provider Order: No   Has the patient had 2 or more falls or a fall with injury in the past year? Yes  Prior Activity Level Community (5-7x/wk): Independent  Prior Functional Level Self Care: Did the patient need help bathing, dressing, using the toilet or eating? Independent  Indoor Mobility: Did the patient need assistance with walking from room to room (with or without device)? Independent  Stairs: Did the patient need assistance with internal or external stairs (with or without device)? Independent  Functional Cognition: Did the patient need help planning regular tasks such as shopping or remembering to take medications? Independent  Patient Information Are you of Hispanic, Latino/a,or Spanish origin?: A. No, not of Hispanic, Latino/a, or Spanish origin What is your race?: A.  White Do you need or want an interpreter to communicate with a doctor or health care staff?: 0. No  Patient's Response To:  Health Literacy and Transportation Is the patient able to respond to health literacy and transportation needs?: Yes Health Literacy - How often do you need to have someone help you when you read instructions, pamphlets, or other written material from your doctor or pharmacy?: Never In the past 12 months, has lack of transportation kept you from medical appointments or from getting medications?: No In the past 12 months, has lack of transportation kept you from meetings, work, or from getting things needed for daily living?: No  Home Assistive Devices / Equipment Home Equipment: Grab bars - tub/shower, Grab bars - toilet  Prior Device Use: Indicate devices/aids used by the patient prior to current illness, exacerbation or injury? None of the above  Current Functional Level Cognition  Arousal/Alertness: Awake/alert Overall Cognitive Status: History of cognitive impairments -  at baseline Orientation Level: Oriented X4 Attention: Sustained Sustained Attention: Appears intact Selective Attention: Appears intact Memory: Impaired Memory Impairment: Storage deficit, Retrieval deficit Awareness: Appears intact Problem Solving: Appears intact Executive Function: Reasoning Reasoning: Appears intact    Extremity Assessment (includes Sensation/Coordination)  Upper Extremity Assessment: Generalized weakness  Lower Extremity Assessment: Defer to PT evaluation    ADLs  Overall ADL's : Needs assistance/impaired Eating/Feeding: Independent, Sitting Grooming: Supervision/safety, Standing, Contact guard assist, Wash/dry hands, Wash/dry face Grooming Details (indicate cue type and reason): stability at sink, using vanity for support Upper Body Bathing: Set up, Sitting Lower Body Bathing: Contact guard assist, Sit to/from stand Lower Body Bathing Details (indicate cue type  and reason): has long handled sponge at home Upper Body Dressing : Set up, Sitting Upper Body Dressing Details (indicate cue type and reason): new hospital gown Lower Body Dressing: Minimal assistance, Sitting/lateral leans, Sit to/from stand Lower Body Dressing Details (indicate cue type and reason): uses sock aid at home Toilet Transfer: Minimal assistance, Ambulation, Regular Toilet, Rollator (4 wheels) Toilet Transfer Details (indicate cue type and reason): VC for safe approach and hand placement to simulate home environment Toileting- Clothing Manipulation and Hygiene: Minimal assistance, Sitting/lateral lean, Sit to/from stand Functional mobility during ADLs: Contact guard assist, Rollator (4 wheels) General ADL Comments: no AD and no physical assist needed for tasks completed. pt limited by orthostatic BP    Mobility  Overal bed mobility: Needs Assistance Bed Mobility: Supine to Sit Supine to sit: Supervision, HOB elevated, Used rails Sit to supine: Contact guard assist General bed mobility comments: not assessed - pt received and left OOB throughout session    Transfers  Overall transfer level: Needs assistance Equipment used: Rollator (4 wheels) Transfers: Sit to/from Stand Sit to Stand: Supervision Bed to/from chair/wheelchair/BSC transfer type:: Step pivot Step pivot transfers: Contact guard assist General transfer comment: Forgot to lock rollator brakes, otherwise demo's good hand placement and safety judgement prior to transferring.    Ambulation / Gait / Stairs / Psychologist, [employer]  Ambulation/Gait Ambulation/Gait assistance: Chief Operating Officer (Feet): 300 Feet Assistive device: Rollator (4 wheels) Gait Pattern/deviations: Step-through pattern, Decreased stride length, Trunk flexed General Gait Details: pt with good recall of education on rollator brake safety when sitting and standing Gait velocity: slightly decreased Gait velocity interpretation: 1.31 - 2.62  ft/sec, indicative of limited community ambulator Stairs: Yes Stairs assistance: Contact guard assist Stair Management: Two rails, Alternating pattern, Forwards Number of Stairs: 2 (1 6in step + 2 3in steps) General stair comments: required cues for where to place rollator before navigating stairs    Posture / Balance Dynamic Sitting Balance Sitting balance - Comments: sitting unsupported from backrest of chair Balance Overall balance assessment: Needs assistance Sitting-balance support: No upper extremity supported, Feet supported Sitting balance-Leahy Scale: Good Sitting balance - Comments: sitting unsupported from backrest of chair Standing balance support: Bilateral upper extremity supported, During functional activity Standing balance-Leahy Scale: Fair Standing balance comment: reliant on rollator, but can remove BUE for brief periods of time without LOB to simulate ADL performance High Level Balance Comments: initial posterior bias    Special considerations/life events  Fall and seizure precautions   Previous Home Environment  Living Arrangements: Spouse/significant other  Lives With: Spouse Available Help at Discharge: Available 24 hours/day Type of Home: Independent living facility Home Layout: One level Home Access: Level entry Bathroom Shower/Tub: Health Visitor: Handicapped height Bathroom Accessibility: Yes How Accessible: Accessible via walker Home Care Services: No Additional Comments:  dining room for dinner  Discharge Living Setting Plans for Discharge Living Setting: Other (Comment) (Independent living at [facility]) Type of Home at Discharge: Independent living facility Care Facility Name at Discharge: [facility] Discharge Home Layout: One level Discharge Home Access: Level entry Discharge Bathroom Shower/Tub: Walk-in shower Discharge Bathroom Toilet: Handicapped height Discharge Bathroom Accessibility: Yes How Accessible: Accessible via  walker Does the patient have any problems obtaining your medications?: No  Social/Family/Support Systems Patient Roles: Spouse Contact Information: SW with [facility] primary contact Anticipated Caregiver: hired asisst at ILF/ALF Anticipated Caregiver's Contact Information: Alfonso Holms ; [phone] Ability/Limitations of Caregiver: wife can provide only supervision Caregiver Availability: 24/7 Discharge Plan Discussed with Primary Caregiver: Yes Is Caregiver In Agreement with Plan?: Yes Does Caregiver/Family have Issues with Lodging/Transportation while Pt is in Rehab?: No  Goals Patient/Family Goal for Rehab: mod I to supervision with PT, OT and SLP Expected length of stay: ELOS 5 to 7 days Additional Information: Wife needs patietn to be able to go to dining hall, bathe self, etc. He has never used a RW before Pt/Family Agrees to Admission and willing to participate: Yes Program Orientation Provided & Reviewed with Pt/Caregiver Including Roles  & Responsibilities: Yes  Decrease burden of Care through IP rehab admission: n/a  Possible need for SNF placement upon discharge: SNF if does not reach a supervision to Mod I level  Patient Condition: This patient's condition remains as documented in the consult dated 01/06/24, in which the Rehabilitation Physician determined and documented that the patient's condition is appropriate for intensive rehabilitative care in an inpatient rehabilitation facility. Will admit to inpatient rehab today.  Preadmission Screen Completed By:  Alison Heron Lot, RN MSN 01/07/2024 3:34 PM ______________________________________________________________________   Discussed status with Dr. PIERRETTE on *** at *** and received approval for admission today.  Admission Coordinator:  Alison Heron Lot, RN MSN time PIERRETTEPattricia ***   Assessment/Plan: Diagnosis: *** Does the need for close, 24 hr/day Medical supervision in concert with the  patient's rehab needs make it unreasonable for this patient to be served in a less intensive setting? {yes_no_potentially:3041433} Co-Morbidities requiring supervision/potential complications: *** Due to {due un:6958565}, does the patient require 24 hr/day rehab nursing? {yes_no_potentially:3041433} Does the patient require coordinated care of a physician, rehab nurse, PT, OT, and SLP to address physical and functional deficits in the context of the above medical diagnosis(es)? {yes_no_potentially:3041433} Addressing deficits in the following areas: {deficits:3041436} Can the patient actively participate in an intensive therapy program of at least 3 hrs of therapy 5 days a week? {yes_no_potentially:3041433} The potential for patient to make measurable gains while on inpatient rehab is {potential:3041437} Anticipated functional outcomes upon discharge from inpatient rehab: {functional outcomes:304600100} PT, {functional outcomes:304600100} OT, {functional outcomes:304600100} SLP Estimated rehab length of stay to reach the above functional goals is: *** Anticipated discharge destination: {anticipated dc setting:21604} 10. Overall Rehab/Functional Prognosis: {potential:3041437}   MD Signature: ***     [1]  Current Facility-Administered Medications:    acetaminophen  (TYLENOL ) tablet 650 mg, 650 mg, Oral, Q4H PRN, 650 mg at 01/07/24 0800 **OR** acetaminophen  (TYLENOL ) 160 MG/5ML solution 650 mg, 650 mg, Per Tube, Q4H PRN **OR** acetaminophen  (TYLENOL ) suppository 650 mg, 650 mg, Rectal, Q4H PRN, Hattar, Laith N, MD   docusate sodium  (COLACE) capsule 100 mg, 100 mg, Oral, BID, Hattar, Zola SAILOR, MD, 100 mg at 01/04/24 2129   finasteride  (PROSCAR ) tablet 5 mg, 5 mg, Oral, Daily, Alva, Rakesh V, MD, 5 mg at 01/07/24 0847   levETIRAcetam  (KEPPRA ) tablet  1,000 mg, 1,000 mg, Oral, BID, Arora, Ashish, MD, 1,000 mg at 01/07/24 0847   levothyroxine  (SYNTHROID ) tablet 75 mcg, 75 mcg, Oral, Daily, Alva, Rakesh  V, MD, 75 mcg at 01/07/24 0607   liothyronine  (CYTOMEL ) tablet 10 mcg, 10 mcg, Oral, Daily, Alva, Rakesh V, MD, 10 mcg at 01/07/24 0848   LORazepam  (ATIVAN ) tablet 0.5 mg, 0.5 mg, Oral, BID PRN, Alva, Rakesh V, MD, 0.5 mg at 12/30/23 2227   metoprolol  succinate (TOPROL -XL) 24 hr tablet 12.5 mg, 12.5 mg, Oral, Daily, Drusilla, Gagan S, MD, 12.5 mg at 01/07/24 9152   metoprolol  tartrate (LOPRESSOR ) injection 2.5 mg, 2.5 mg, Intravenous, Q5 min PRN, Perri DELENA Meliton Mickey., MD, 2.5 mg at 01/01/24 1241   midodrine  (PROAMATINE ) tablet 2.5 mg, 2.5 mg, Oral, BID WC, Drusilla, Gagan S, MD, 2.5 mg at 01/07/24 0846   ondansetron  (ZOFRAN -ODT) disintegrating tablet 4 mg, 4 mg, Oral, Q6H PRN, 4 mg at 01/01/24 0900 **OR** ondansetron  (ZOFRAN ) injection 4 mg, 4 mg, Intravenous, Q6H PRN, Hattar, Zola SAILOR, MD, 4 mg at 12/31/23 0157   pantoprazole  (PROTONIX ) EC tablet 40 mg, 40 mg, Oral, Daily, 40 mg at 01/07/24 0848 **OR** [DISCONTINUED] pantoprazole  (PROTONIX ) injection 40 mg, 40 mg, Intravenous, Daily, Hattar, Laith N, MD, 40 mg at 01/01/24 1028   PARoxetine  (PAXIL ) tablet 10 mg, 10 mg, Oral, Daily, Alva, Rakesh V, MD, 10 mg at 01/07/24 0848   polyethylene glycol (MIRALAX  / GLYCOLAX ) packet 17 g, 17 g, Oral, BID, Perri DELENA Meliton Mickey., MD, 17 g at 01/04/24 2128   Prednisolon-Moxiflox-Bromfenac  1-0.5-0.075 % SOLN 1 drop, 1 drop, Left Eye, TID, Perri DELENA Meliton Mickey., MD, 1 drop at 01/07/24 [phone]

## 2024-01-06 NOTE — Progress Notes (Signed)
" °  Inpatient Rehabilitation Admissions Coordinator   Met with patient at bedside for rehab assessment and spoke with his wife by phone. I spoke with SW, Alfonso Holms, with [employer] [facility].  I discussed goals and expectations of a possible CIR admit. They prefer CIR for rehab. I will ask Rehab MD to consult to assist with obtaining Auth.  I will begin insurance Auth with [facility] Medicare for possible CIR admit pending approval. Please call me with any questions.   Heron Leavell, RN, MSN Rehab Admissions Coordinator [phone]   "

## 2024-01-07 DIAGNOSIS — I609 Nontraumatic subarachnoid hemorrhage, unspecified: Secondary | ICD-10-CM | POA: Diagnosis not present

## 2024-01-07 DIAGNOSIS — R569 Unspecified convulsions: Secondary | ICD-10-CM | POA: Diagnosis not present

## 2024-01-07 DIAGNOSIS — R4701 Aphasia: Secondary | ICD-10-CM | POA: Diagnosis not present

## 2024-01-07 DIAGNOSIS — I951 Orthostatic hypotension: Secondary | ICD-10-CM | POA: Diagnosis not present

## 2024-01-07 LAB — CBC
HCT: 37.1 % — ABNORMAL LOW (ref 39.0–52.0)
HCT: 37.4 % — ABNORMAL LOW (ref 39.0–52.0)
Hemoglobin: 12.3 g/dL — ABNORMAL LOW (ref 13.0–17.0)
Hemoglobin: 12.5 g/dL — ABNORMAL LOW (ref 13.0–17.0)
MCH: 28.8 pg (ref 26.0–34.0)
MCH: 29 pg (ref 26.0–34.0)
MCHC: 33.2 g/dL (ref 30.0–36.0)
MCHC: 33.4 g/dL (ref 30.0–36.0)
MCV: 86.9 fL (ref 80.0–100.0)
MCV: 86.8 fL (ref 80.0–100.0)
Platelets: 224 K/uL (ref 150–400)
Platelets: 210 K/uL (ref 150–400)
RBC: 4.27 MIL/uL (ref 4.22–5.81)
RBC: 4.31 MIL/uL (ref 4.22–5.81)
RDW: 13.5 % (ref 11.5–15.5)
RDW: 13.6 % (ref 11.5–15.5)
WBC: 11.2 K/uL — ABNORMAL HIGH (ref 4.0–10.5)
WBC: 10.2 K/uL (ref 4.0–10.5)
nRBC: 0 % (ref 0.0–0.2)
nRBC: 0 % (ref 0.0–0.2)

## 2024-01-07 LAB — OCCULT BLOOD X 1 CARD TO LAB, STOOL: Fecal Occult Bld: POSITIVE — AB

## 2024-01-07 LAB — TYPE AND SCREEN
ABO/RH(D): A POS
Antibody Screen: NEGATIVE

## 2024-01-07 NOTE — Progress Notes (Signed)
" °  Inpatient Rehabilitation Admissions Coordinator   I have begun insurance Auth for possible CIR admit.  Heron Leavell, RN, MSN Rehab Admissions Coordinator [phone] 01/07/2024 8:18 AM  "

## 2024-01-07 NOTE — Progress Notes (Signed)
 [facility]  PROGRESS NOTE  Raymond Christensen FMW:[id number] DOB: 01-06-1941 DOA: 12/26/2023 PCP: Frann Mabel Mt, DO   Brief HPI:   83 year old male with past medical history of paroxysmal A-fib on Eliquis  last dose on day of presentation in the morning, CAD, heart failure with midrange ejection fraction 45% who presented to the hospital as a code stroke due to sudden onset expressive aphasia.   History obtained from patient, family and chart review.  Patient had a syncopal episode 6 days prior to his presentation and fell and hit the floor with bruising on his head around his right eye.  He is not able to recall what exactly happened.   On presentation to the hospital, he was noted to have significant expressive aphasia.  Code stroke and sent for head CT with findings concerning for acute left hemisphere hemorrhage with subarachnoid and intra-axial component.   Neurosurgery was consulted and they recommended repeat head CT in 6 hours.   Significant Events 12/12 head CT with acute L hemisphere hemorrhage with suspected subarachnoid and intraaxial components with regional hypodense edema Admitted to the ICU  12/26/23 12/13 unchanged L sided subarachnoid hemorrhage, subdural hemorrhage, and suspected L temporal intraparenchymal hemorrhage and edema 12/13 LTM EEG - seizure noted 12/14 neurology signed off 12/14 last neurosurgery note 12/15 orthostatic positive 12/16 [facility] assumed care    Post ICU care complicated by orthostatic hypotension limiting his ability to participate therapy.  We've started him on compression stockings, abdominal binder.  Started midodrine .     Discharge pending improvement in orthostasis.      Assessment/Plan:   Traumatic L Temporal ICH Traumatic SAH  Subdural Hemorrhage Expressive Aphasia  Seizures Head CT at presentation 12/12 with acute L hemisphere hemorrhage with suspected subarachnoid and intraaxial components with regional hypodense  edema CTA head/neck 12/12 negative for intracranial aneurysm, LVO, or CTA spot sign in association with acute intracranial hemorrhage - atherosclerosis including moderate to severe stenosis of the L ACA A1 segment, up to moderate bilateral supraclinoid ICA stenosis - previous L carotid endarterectomy CT 12/13 unchanged L sided subarachnoid hemorrhage, subdural hemorrhage and suspected L temporal intraparenchymal hemorrhage and edema Repeat head CT 12/19  with decreased SAH in L>R cerebral hemispheres, decreased hemorrhage in L temporal lobe with decreased edema in this region, unchanged subdural hematoma over L cerebral convexity, decreased small volume IVH in lateral ventricles MRI with subarachnoid hemorrhage involving the L greater than R cerebral hemispheres, subdural hematoma overlying the L cerebral convexity measuring up to 4 mm in maximal thickness without significant mass effect or  midline shift (12/20) we obtained this due to some word finding difficulty and transient encephalopathy - suspect this is due to hypotension while working with therapy in setting of his SAH.   Neurosurgery saw last on 12/14 Neurology signed off 12/14 - recommending keppra  1 g BID for seizures in setting of traumatic ICH, SDH, SAH, hypertensive emergency, coagulopathy.  Seizure precautions.  Neurology follow up in 8-12 weeks.    [place] statutes, patients with seizures are not allowed to drive until  they have been seizure-free for six months. Use caution when using heavy equipment or power tools. Avoid working on ladders or at heights. Take showers instead of baths. Ensure the water temperature is not too high on the home water heater. Do not go swimming alone. When caring for infants or small children, sit down when holding, feeding, or changing them to minimize risk of injury to the child in the event you  have a seizure. Also, Maintain good sleep hygiene. Avoid alcohol.  Positive FOBT -Had black tarry  stool last night -Stool for FOBT is positive -Eval stable at 12.5 -Called and discussed with GI on-call Dr. Nancy Mcgreal, she recommends to continue monitoring hemoglobin. -Might need GI evaluation as outpatient -If hemoglobin drops continues to have black stool, will need intervention during this hospitalization -Follow CBC in a.m.   Orthostatic Hypotension Continue compression stockings, abdominal binder when OOB Metop low dose resumed due to RVR 12/18 Started on midodrine  2.5 mg 3 times daily; will change midodrine  to 2.5 mg p.o. twice daily Follow cortisol 12.2 (unlikely adrenal insufficiency -ACTH  stimulation test is normal, TSH (wnl) - Blood pressure improves post mobility with therapy    Atrial Fibrillation with RVR Eliquis  has been discontinued in the setting of above (per nsgy, if indicated, can resume in 2 weeks) Rates improved with IV metop, follow with oral metop  -Continue Toprol -XL 12.5 mg daily   HFmrEF Diastolic Dysfunction S/p mTEER procedure  Moderate Mitral Regurgitation Currently appears euvolemic  Caution with IVF   Hypothyroidism Synthroid , cytomel    BPH Overactive Bladder Proscar  Not taking gemtesa   Depression  Anxiety Continue paxil , ativan   Will discontinue wellbutrin  at discharge due to his seizure above   Dyslipidemia Statin, zetia  on hold       DVT prophylaxis: SCDs  Medications     docusate sodium   100 mg Oral BID   finasteride   5 mg Oral Daily   levETIRAcetam   1,000 mg Oral BID   levothyroxine   75 mcg Oral Daily   liothyronine   10 mcg Oral Daily   metoprolol  succinate  12.5 mg Oral Daily   midodrine   2.5 mg Oral BID WC   pantoprazole   40 mg Oral Daily   PARoxetine   10 mg Oral Daily   polyethylene glycol  17 g Oral BID   Prednisolon-Moxiflox-Bromfenac   1 drop Left Eye TID     Data Reviewed:   CBG:  Recent Labs  Lab 01/03/24 2213 01/03/24 2349 01/04/24 0345 01/04/24 0407 01/04/24 0844  GLUCAP 102* 105* 107*  105* 120*    SpO2: 100 %    Vitals:   01/07/24 0749 01/07/24 0845 01/07/24 1144 01/07/24 1514  BP: (!) 155/76 (!) 144/72 101/62 (!) 140/70  Pulse: 74 78 69 72  Resp: 15  19 18   Temp: 97.9 F (36.6 C)  97.6 F (36.4 C) (!) 97.5 F (36.4 C)  TempSrc: Oral  Oral Oral  SpO2: 97%  98% 100%  Weight:      Height:          Data Reviewed:  Basic Metabolic Panel: Recent Labs  Lab 01/01/24 0307 01/02/24 0606 01/03/24 0907 01/04/24 0552  NA 132* 131* 132* 134*  K 3.9 4.0 4.0 3.8  CL 100 99 100 101  CO2 23 24 20* 26  GLUCOSE 104* 102* 103* 99  BUN 18 19 15 14   CREATININE 0.76 0.75 0.67 0.67  CALCIUM  8.7* 8.7* 8.7* 8.7*  MG 1.9 1.9 1.8 1.8  PHOS 3.0 2.8 3.2 3.2    CBC: Recent Labs  Lab 01/01/24 0307 01/02/24 0606 01/03/24 0907 01/04/24 0552 01/07/24 0256 01/07/24 0604  WBC 11.9* 11.8* 9.6 9.7 11.2* 10.2  NEUTROABS 8.2* 7.9* 6.3 6.2  --   --   HGB 13.1 12.3* 12.9* 12.5* 12.3* 12.5*  HCT 38.7* 36.9* 39.4 37.1* 37.1* 37.4*  MCV 87.0 86.4 88.1 85.9 86.9 86.8  PLT 186 189 201 199 224 210  LFT Recent Labs  Lab 01/01/24 0307 01/02/24 0606 01/03/24 0907 01/04/24 0552  AST 18 18 25 19   ALT 16 19 28 26   ALKPHOS 75 74 77 79  BILITOT 0.8 0.6 0.6 0.6  PROT 5.9* 6.0* 5.8* 5.6*  ALBUMIN  3.5 3.5 3.4* 3.4*     Antibiotics: Anti-infectives (From admission, onward)    None        CONSULTS   Code Status: Full code  Family Communication: Called and discussed with patient's wife at bedside     Subjective   Had black tarry stool last night.  Stool for FOBT positive.   Objective    Physical Examination:  General-appears in no acute distress Heart-S1-S2, regular, no murmur auscultated Lungs-clear to auscultation bilaterally, no wheezing or crackles auscultated Abdomen-soft, nontender, no organomegaly Extremities-no edema in the lower extremities Neuro-alert, oriented x3, no focal deficit noted            Raymond Christensen S Raymond Christensen   [facility] If 7PM-7AM, please contact night-coverage at [email or web address], Office  [phone]   01/07/2024, 6:30 PM  LOS: 12 days

## 2024-01-07 NOTE — Progress Notes (Addendum)
" °  Inpatient Rehabilitation Admissions Coordinator   I met with patient, wife, and son from [place] at bedside. Explained that both PT and OT now recommending HH.  We await insurance determination for possible short stay at [facility] prior to return to [facility] with 24/7 supervision that will need to be arranged/hired. I will call and update SW navigator, Alfonso. I will follow up on Friday.  Heron Leavell, RN, MSN Rehab Admissions Coordinator [phone] 01/07/2024 3:25 PM  "

## 2024-01-07 NOTE — Progress Notes (Signed)
 Occupational Therapy Treatment Patient Details Name: Raymond Christensen MRN: [id number] DOB: 1940-08-10 Today's Date: 01/07/2024   History of present illness 83 y.o. M presented to the hospital as a code stroke 12/12 due to sudden onset expressive aphasia. Found to have acute left hemisphere hemorrhage with subarachnoid and intra-axial component. Did have a fall 6 days PTA. Repeat imaging 12/19: MRI Brain (-) for infarct and no other acute intracranial abnormality; CT Head showed decreased SAH involving L>R cerebral hemispheres, decreased hemorrhage in left temporal lobe with decreased edema in this region, unchanged subdural hematoma over left cerebral convexity, and decreased small volume intraventricular hemorrhage in the lateral ventricles. PHMx: paroxysmal A-fib,  CAD, heart failure with midrange ejection fraction 45%, HTN, DM, anxiety, NSTEMI, memory change, OA, skin CA left arm, vaso vagal responses to GI issues.   OT comments  Pt seen for OT tx this PM. Several family members present at bedside. Pt demonstrating good progress towards OT goals today. See below for orthostatic BP readings (pt denying worsening symptoms). Supervision for sit<>stand transfers and CGA/SBA for community distance ambulation + rollator. Still min A for LB ADLs. Tolerated therapeutic exercises in hall to simulate higher level IADLs. Given pt's progress, upgrading rec to [facility]. Will continue to follow.  Orthostatic BP Measurements:  Sitting: 133/71 (89) Standing x1': 100/76 (86) Standing x3': 96/80 (87) Post-mobility: 129/94 (106)      If plan is discharge home, recommend the following:  A little help with bathing/dressing/bathroom;Assist for transportation;Help with stairs or ramp for entrance;Assistance with cooking/housework   Equipment Recommendations  None recommended by OT    Recommendations for Other Services      Precautions / Restrictions Precautions Precautions: Fall Recall of  Precautions/Restrictions: Intact Precaution/Restrictions Comments: orthostatic; TED hose and abdominal binder Restrictions Weight Bearing Restrictions Per Provider Order: No       Mobility Bed Mobility               General bed mobility comments: not assessed - pt received and left OOB throughout session    Transfers Overall transfer level: Needs assistance Equipment used: Rollator (4 wheels) Transfers: Sit to/from Stand Sit to Stand: Supervision           General transfer comment: Forgot to lock rollator brakes, otherwise demo's good hand placement and safety judgement prior to transferring.     Balance Overall balance assessment: Needs assistance Sitting-balance support: No upper extremity supported, Feet supported Sitting balance-Leahy Scale: Good Sitting balance - Comments: sitting unsupported from backrest of chair   Standing balance support: Bilateral upper extremity supported, During functional activity Standing balance-Leahy Scale: Fair Standing balance comment: reliant on rollator, but can remove BUE for brief periods of time without LOB to simulate ADL performance                           ADL either performed or assessed with clinical judgement   ADL                                              Extremity/Trunk Assessment Upper Extremity Assessment Upper Extremity Assessment: Generalized weakness            Vision       Perception     Praxis     Communication Communication Communication: No apparent difficulties   Cognition Arousal: Alert Behavior During  Therapy: WFL for tasks assessed/performed Cognition: No apparent impairments             OT - Cognition Comments: pt conversational and pleasant during OT session today                 Following commands: Intact Following commands impaired: Follows multi-step commands with increased time      Cueing   Cueing Techniques: Verbal cues   Exercises Other Exercises Other Exercises: standing trunk rotation 1x10 to simulate item retrieval and higher level IADLs Other Exercises: standing calf raises with BUE support on wall and VC for pacing/breathing 1x20    Shoulder Instructions       General Comments several family members present, see assessment portion of note for OH readings    Pertinent Vitals/ Pain       Pain Assessment Pain Assessment: No/denies pain  Home Living                                          Prior Functioning/Environment              Frequency  Min 2X/week        Progress Toward Goals  OT Goals(current goals can now be found in the care plan section)  Progress towards OT goals: Progressing toward goals     Plan      Co-evaluation                 AM-PAC OT 6 Clicks Daily Activity     Outcome Measure   Help from another person eating meals?: None Help from another person taking care of personal grooming?: A Little Help from another person toileting, which includes using toliet, bedpan, or urinal?: A Little Help from another person bathing (including washing, rinsing, drying)?: A Lot Help from another person to put on and taking off regular upper body clothing?: A Little Help from another person to put on and taking off regular lower body clothing?: A Little 6 Click Score: 18    End of Session Equipment Utilized During Treatment: Gait belt;Rollator (4 wheels)  OT Visit Diagnosis: Unsteadiness on feet (R26.81);Other abnormalities of gait and mobility (R26.89)   Activity Tolerance Patient tolerated treatment well   Patient Left in chair;with call bell/phone within reach;with chair alarm set;with family/visitor present   Nurse Communication Mobility status        Time: 1345-1414 OT Time Calculation (min): 29 min  Charges: OT General Charges $OT Visit: 1 Visit OT Treatments $Therapeutic Activity: 23-37 mins  Owain Eckerman M. [place], OTR/L [facility]  Acute Rehabilitation Services [phone] Secure Chat Preferred  Rikki [place] 01/07/2024, 2:36 PM

## 2024-01-07 NOTE — Progress Notes (Signed)
 Patient had large tarry stool. MD made aware; fecal occult test sent to lab.

## 2024-01-07 NOTE — H&P (Shared)
 "   Physical Medicine and Rehabilitation Admission H&P    Chief Complaint  Patient presents with   Code Stroke  : HPI: Raymond Christensen is an 83 year old right-handed male with history of CAD/MI/CABG, mitral valve regurgitation status post mitral valve clip implantation, hypertension, hypothyroidism, major depressive disorder, OSA on CPAP, BPH, type 2 diabetes mellitus, chronic systolic congestive heart failure, atrial fibrillation maintained on Eliquis , quit smoking 46 years ago.  Per chart review patient lives with spouse.  Independent living facility/habits would.  Modified independent for mobility.  Presented 12/26/2023 with acute onset of expressive aphasia following syncopal episode with fall striking head sustaining a bruise around his right eye.  CT of the head showed acute left hemisphere hemorrhage with suspected subarachnoid and intra-axial components with regional hypodense edema.  Admission chemistries unremarkable except glucose 113, AST 44, total bilirubin 1.5.  CTA negative for intracranial aneurysm or large vessel occlusion.  EEG showed 1 seizure without clinical signs arising from left temporal region on 12/27/2023 lasting for approximately 35 seconds additionally there was cortical dysfunction arising from the left temporal region likely secondary to underlying structural abnormality.  Placed on Keppra  titrated to 1000 mg twice daily.  Echocardiogram with ejection fraction of 55 to 60% no wall motion abnormalities.  Neurosurgery Dr. Lanis consulted for subarachnoid hemorrhage and Eliquis  was reversed and latest follow-up imaging MRI 01/02/2024 showed scattered subarachnoid hemorrhage involving the left greater than right cerebral hemispheres stable from prior CT.  A subdural hematoma overlying the left cerebral convexity measuring up to 4 mm in maximal thickness without significant mass effect or midline shift.SABRA  Hospital course complicated by orthostatic hypotension as well as A-fib  with RVR cardiac rate monitor with control and maintained on low-dose Toprol .  Patient is currently maintained on low-dose ProAmatine  monitoring of blood pressure.  Therapy evaluations completed due to patient's decreased functional mobility was admitted for comprehensive rehab program.  Review of Systems  Constitutional:  Negative for chills and fever.  HENT:  Positive for hearing loss.   Eyes:  Negative for blurred vision and double vision.  Respiratory:  Negative for cough, shortness of breath and wheezing.   Cardiovascular:  Positive for palpitations. Negative for chest pain and leg swelling.  Gastrointestinal:  Positive for constipation. Negative for heartburn, nausea and vomiting.  Genitourinary:  Positive for urgency. Negative for dysuria, flank pain and hematuria.  Musculoskeletal:  Positive for falls, joint pain and myalgias.  Skin:  Negative for rash.  Neurological:  Positive for dizziness, speech change and weakness.  Psychiatric/Behavioral:  Positive for depression.   All other systems reviewed and are negative.  Past Medical History:  Diagnosis Date   Arthritis    CAD (coronary artery disease)    Cancer (HCC)    skin lt. arm.   Carotid artery disease    s/p left CEA 01/17/14   Chronic systolic CHF (congestive heart failure) [facility])    Concussion Summer 2012   Essential hypertension 02/26/2017   Heart disease    Hyperlipidemia    Hypothyroidism 10/15/2017   Major depressive disorder    Mitral valve regurgitation    Myocardial infarction (HCC) 1996, 2021   Obstructive sleep apnea on CPAP    Persistent atrial fibrillation (HCC)    Poor flexibility of tendon 12/01/2018   S/P mitral valve clip implantation 12/23/2019   s/p TEER with MitraClip with one XTW by Dr. Wonda   Subungual hematoma of digit of hand 12/01/2018   Subungual hematoma of right foot 12/01/2018  Type 2 diabetes mellitus without complication, without long-term current use of insulin  (HCC) 02/26/2017    Past Surgical History:  Procedure Laterality Date   ATRIAL FIBRILLATION ABLATION N/A 02/01/2020   Procedure: ATRIAL FIBRILLATION ABLATION;  Surgeon: Inocencio Soyla Lunger, MD;  Location: [facility] INVASIVE CV LAB;  Service: Cardiovascular;  Laterality: N/A;   BYPASS GRAFT  2001   CARDIAC CATHETERIZATION     CARDIOVERSION N/A 11/08/2019   Procedure: CARDIOVERSION;  Surgeon: Delford Maude BROCKS, MD;  Location: [facility] ENDOSCOPY;  Service: Cardiovascular;  Laterality: N/A;   CAROTID ENDARTERECTOMY Left 01/17/2014   CORONARY ANGIOPLASTY     CORONARY ARTERY BYPASS GRAFT     LEFT HEART CATH AND CORS/GRAFTS ANGIOGRAPHY N/A 06/26/2022   Procedure: LEFT HEART CATH AND CORS/GRAFTS ANGIOGRAPHY;  Surgeon: Burnard Debby LABOR, MD;  Location: [facility] INVASIVE CV LAB;  Service: Cardiovascular;  Laterality: N/A;   RIGHT/LEFT HEART CATH AND CORONARY/GRAFT ANGIOGRAPHY N/A 09/09/2019   Procedure: RIGHT/LEFT HEART CATH AND CORONARY/GRAFT ANGIOGRAPHY;  Surgeon: Court Dorn PARAS, MD;  Location: [facility] INVASIVE CV LAB;  Service: Cardiovascular;  Laterality: N/A;   TEE WITHOUT CARDIOVERSION N/A 09/13/2019   Procedure: TRANSESOPHAGEAL ECHOCARDIOGRAM (TEE);  Surgeon: Lonni Slain, MD;  Location: [facility] ENDOSCOPY;  Service: Cardiovascular;  Laterality: N/A;   TEE WITHOUT CARDIOVERSION N/A 12/23/2019   Procedure: TRANSESOPHAGEAL ECHOCARDIOGRAM (TEE);  Surgeon: Wonda Sharper, MD;  Location: [facility] INVASIVE CV LAB;  Service: Cardiovascular;  Laterality: N/A;   TEE WITHOUT CARDIOVERSION  02/01/2020   Procedure: TRANSESOPHAGEAL ECHOCARDIOGRAM (TEE);  Surgeon: Inocencio Soyla Lunger, MD;  Location: [facility] INVASIVE CV LAB;  Service: Cardiovascular;;   TOTAL HIP ARTHROPLASTY  1994, 1995   TOTAL HIP ARTHROPLASTY Bilateral    TRANSCATHETER MITRAL EDGE TO EDGE REPAIR N/A 12/23/2019   Procedure: MITRAL VALVE REPAIR;  Surgeon: Wonda Sharper, MD;  Location: [facility] INVASIVE CV LAB;  Service: Cardiovascular;  Laterality: N/A;   TRANSURETHRAL RESECTION OF  PROSTATE N/A 12/27/2022   Procedure: TRANSURETHRAL RESECTION OF THE PROSTATE (TURP);  Surgeon: Nieves Cough, MD;  Location: [facility] ORS;  Service: Urology;  Laterality: N/A;  75 minute case   Family History  Problem Relation Age of Onset   Dementia Father        Concerns surrounding Lewy body dementia, but never officially diagnosed   Cancer Neg Hx    Social History:  reports that he quit smoking about 46 years ago. His smoking use included cigarettes. He has never used smokeless tobacco. He reports current alcohol use of about 1.0 standard drink of alcohol per week. He reports that he does not use drugs. Allergies: Allergies[1] Medications Prior to Admission  Medication Sig Dispense Refill   acetaminophen  (TYLENOL ) 500 MG tablet Take 1,000 mg by mouth daily.     atorvastatin  (LIPITOR ) 80 MG tablet TAKE 1 TABLET BY MOUTH DAILY 90 tablet 3   buPROPion  (WELLBUTRIN  XL) 150 MG 24 hr tablet 1 qam  for  1 week then 2 qam (Patient taking differently: Take 150 mg by mouth in the morning and at bedtime.) 60 tablet 4   Cholecalciferol  (VITAMIN D3) 250 MCG (10000 UT) TABS Take 1 tablet by mouth daily.     docusate sodium  (COLACE) 100 MG capsule Take 100 mg by mouth daily as needed for mild constipation or moderate constipation.     ELIQUIS  5 MG TABS tablet TAKE 1 TABLET BY MOUTH TWICE  DAILY 180 tablet 3   ezetimibe  (ZETIA ) 10 MG tablet TAKE 1 TABLET BY MOUTH DAILY 90 tablet 3   famotidine  (PEPCID ) 20  MG tablet Take 20 mg by mouth daily.     finasteride  (PROSCAR ) 5 MG tablet Take 1 tablet (5 mg total) by mouth daily. 90 tablet 3   levothyroxine  (SYNTHROID ) 75 MCG tablet TAKE 1 TABLET BY MOUTH DAILY 90 tablet 1   liothyronine  (CYTOMEL ) 5 MCG tablet TAKE 2 TABLETS BY MOUTH DAILY 180 tablet 1   LORazepam  (ATIVAN ) 0.5 MG tablet 1 bid (Patient taking differently: Take 0.5 mg by mouth daily.) 60 tablet 4   metoprolol  succinate (TOPROL -XL) 25 MG 24 hr tablet Take 0.5 tablets (12.5 mg total) by mouth daily. 45  tablet 3   Multiple Vitamins-Minerals (MULTIVITAMIN ADULT EXTRA C PO) Take 1 tablet by mouth daily.      nitroGLYCERIN  (NITROSTAT ) 0.4 MG SL tablet Place 1 tablet (0.4 mg total) under the tongue every 5 (five) minutes as needed for chest pain. 30 tablet 12   OZEMPIC , 2 MG/DOSE, 8 MG/3ML SOPN INJECT SUBCUTANEOUSLY 2 MG EVERY WEEK ON SUNDAY AS DIRECTED (Patient taking differently: Inject 2 mg into the skin once a week.) 9 mL 3   PARoxetine  (PAXIL ) 10 MG tablet TAKE 1 TABLET BY MOUTH DAILY 90 tablet 0   UNABLE TO FIND Place into the left eye in the morning, at noon, in the evening, and at bedtime. Prednisolone  Phosphate 1% /  Moxifloxacin  0.5% / Bromfenac  0.075% Sterile Ophthalmic Solution     fluorouracil (EFUDEX) 5 % cream Apply 1 Application topically 2 (two) times daily. (Patient not taking: Reported on 12/28/2023)     furosemide  (LASIX ) 20 MG tablet Take 1 tablet (20 mg total) by mouth daily as needed (leg swelling). (Patient not taking: Reported on 12/28/2023) 30 tablet 11   GEMTESA 75 MG TABS Take 1 tablet by mouth daily. (Patient not taking: Reported on 12/28/2023)        Home: Home Living Family/patient expects to be discharged to:: Other (Comment) (indpendent living facility) Living Arrangements: Spouse/significant other Available Help at Discharge: Available 24 hours/day Type of Home: Independent living facility Home Access: Level entry Home Layout: One level Bathroom Shower/Tub: Health Visitor: Handicapped height Bathroom Accessibility: Yes Home Equipment: Grab bars - tub/shower, Grab bars - toilet Adaptive Equipment: Sock aid Additional Comments: dining room for dinner  Lives With: Spouse   Functional History: Prior Function Prior Level of Function : Independent/Modified Independent Mobility Comments: Lives [facility]  Functional Status:  Mobility: Bed Mobility Overal bed mobility: Needs Assistance Bed Mobility: Supine to Sit Supine  to sit: Supervision, HOB elevated, Used rails Sit to supine: Contact guard assist General bed mobility comments: not assessed - pt received and left OOB throughout session Transfers Overall transfer level: Needs assistance Equipment used: Rollator (4 wheels) Transfers: Sit to/from Stand Sit to Stand: Supervision Bed to/from chair/wheelchair/BSC transfer type:: Step pivot Step pivot transfers: Contact guard assist General transfer comment: Forgot to lock rollator brakes, otherwise demo's good hand placement and safety judgement prior to transferring. Ambulation/Gait Ambulation/Gait assistance: Supervision Gait Distance (Feet): 300 Feet Assistive device: Rollator (4 wheels) Gait Pattern/deviations: Step-through pattern, Decreased stride length, Trunk flexed General Gait Details: pt with good recall of education on rollator brake safety when sitting and standing Gait velocity: slightly decreased Gait velocity interpretation: 1.31 - 2.62 ft/sec, indicative of limited community ambulator Stairs: Yes Stairs assistance: Contact guard assist Stair Management: Two rails, Alternating pattern, Forwards Number of Stairs: 2 (1 6in step + 2 3in steps) General stair comments: required cues for where to place rollator before navigating stairs  ADL: ADL Overall ADL's : Needs assistance/impaired Eating/Feeding: Independent, Sitting Grooming: Supervision/safety, Standing, Contact guard assist, Wash/dry hands, Wash/dry face Grooming Details (indicate cue type and reason): stability at sink, using vanity for support Upper Body Bathing: Set up, Sitting Lower Body Bathing: Contact guard assist, Sit to/from stand Lower Body Bathing Details (indicate cue type and reason): has long handled sponge at home Upper Body Dressing : Set up, Sitting Upper Body Dressing Details (indicate cue type and reason): new hospital gown Lower Body Dressing: Minimal assistance, Sitting/lateral leans, Sit to/from  stand Lower Body Dressing Details (indicate cue type and reason): uses sock aid at home Toilet Transfer: Minimal assistance, Ambulation, Regular Toilet, Rollator (4 wheels) Toilet Transfer Details (indicate cue type and reason): VC for safe approach and hand placement to simulate home environment Toileting- Clothing Manipulation and Hygiene: Minimal assistance, Sitting/lateral lean, Sit to/from stand Functional mobility during ADLs: Contact guard assist, Rollator (4 wheels) General ADL Comments: no AD and no physical assist needed for tasks completed. pt limited by orthostatic BP  Cognition: Cognition Overall Cognitive Status: History of cognitive impairments - at baseline Arousal/Alertness: Awake/alert Orientation Level: Oriented X4 Year: 2025 Month: December Day of Week: Correct Attention: Sustained Sustained Attention: Appears intact Selective Attention: Appears intact Memory: Impaired Memory Impairment: Storage deficit, Retrieval deficit Awareness: Appears intact Problem Solving: Appears intact Executive Function: Reasoning Reasoning: Appears intact Cognition Arousal: Alert Behavior During Therapy: WFL for tasks assessed/performed Overall Cognitive Status: History of cognitive impairments - at baseline  Physical Exam: Blood pressure (!) 141/63, pulse 72, temperature 97.7 F (36.5 C), temperature source Oral, resp. rate 18, height 5' 8 (1.727 m), weight 90.1 kg, SpO2 94%. Physical Exam Neurological:     Comments: Patient is alert.  No acute distress.  He is somewhat hard of hearing.  Follows commands.  Speech is dysarthric with some word finding difficulty.     Results for orders placed or performed during the hospital encounter of 12/26/23 (from the past 48 hours)  CBC     Status: Abnormal   Collection Time: 01/07/24  6:04 AM  Result Value Ref Range   WBC 10.2 4.0 - 10.5 K/uL   RBC 4.31 4.22 - 5.81 MIL/uL   Hemoglobin 12.5 (L) 13.0 - 17.0 g/dL   HCT 62.5 (L) 60.9 -  52.0 %   MCV 86.8 80.0 - 100.0 fL   MCH 29.0 26.0 - 34.0 pg   MCHC 33.4 30.0 - 36.0 g/dL   RDW 86.3 88.4 - 84.4 %   Platelets 210 150 - 400 K/uL   nRBC 0.0 0.0 - 0.2 %    Comment: Performed at [facility], [address] [place], [place] [address]  CBC     Status: Abnormal   Collection Time: 01/08/24  6:41 AM  Result Value Ref Range   WBC 10.3 4.0 - 10.5 K/uL   RBC 4.41 4.22 - 5.81 MIL/uL   Hemoglobin 12.6 (L) 13.0 - 17.0 g/dL   HCT 61.9 (L) 60.9 - 47.9 %   MCV 86.2 80.0 - 100.0 fL   MCH 28.6 26.0 - 34.0 pg   MCHC 33.2 30.0 - 36.0 g/dL   RDW 86.4 88.4 - 84.4 %   Platelets 226 150 - 400 K/uL   nRBC 0.0 0.0 - 0.2 %    Comment: Performed at [facility], [address], [place], [place] [address]   No results found.    Blood pressure (!) 141/63, pulse 72, temperature 97.7 F (36.5 C),  temperature source Oral, resp. rate 18, height 5' 8 (1.727 m), weight 90.1 kg, SpO2 94%.  Medical Problem List and Plan: 1. Functional deficits secondary to traumatic SAH after fall 2012 01/03/2024.  Conservative care follow-up neurosurgery  -patient may *** shower  -ELOS/Goals: *** 2.  Antithrombotics: -DVT/anticoagulation:  Mechanical: Antiembolism stockings, thigh (TED hose) Bilateral lower extremities  -antiplatelet therapy: N/A 3. Pain Management: Tylenol  as needed 4. Mood/Behavior/Sleep: Paxil  10 mg daily, Ativan  0.5 mg twice daily as needed  -antipsychotic agents: N/A 5. Neuropsych/cognition: This patient is capable of making decisions on his own behalf. 6. Skin/Wound Care: Routine skin checks 7. Fluids/Electrolytes/Nutrition: Routine in and outs with follow-up chemistries 8.  Orthostatic hypotension.  ProAmatine  2.5 mg twice daily as well as abdominal binder.  Monitor with increased mobility 9.  PAF.  Eliquis  on hold due to [facility].  Continue low-dose Toprol -XL 12.5 mg daily 10.  Seizure prophylaxis.  Keppra  1000 mg twice daily 11.  BPH.  Proscar  5 mg daily 12.   Hypothyroidism.  Synthroid  13.  Constipation.  MiraLAX  daily 14.  Right forearm wound.  Cleanse with normal saline apply Vaseline gauze to wound bed daily and secure with silicone foam or Curlex roll gauze whichever is preferred. 15.  Chronic systolic congestive heart failure.  Monitor for any signs of fluid overload 16.  CAD/MI/mitral valve regurgitation status post mitral valve clip implantation.  Follow-up outpatient cardiology   Toribio JINNY Pitch, PA-C 01/09/2024     [1]  Allergies Allergen Reactions   Sulfa Antibiotics Rash    Questionable, he developed a diffuse rash 2 days after stopping Bactrim   "

## 2024-01-07 NOTE — Plan of Care (Signed)
" °  Problem: Coping: Goal: Will verbalize positive feelings about self Outcome: Progressing Goal: Will identify appropriate support needs Outcome: Progressing   Problem: Self-Care: Goal: Ability to communicate needs accurately will improve Outcome: Progressing   Problem: Nutrition: Goal: Risk of aspiration will decrease Outcome: Progressing   Problem: Clinical Measurements: Goal: Respiratory complications will improve Outcome: Progressing Goal: Cardiovascular complication will be avoided Outcome: Progressing   Problem: Activity: Goal: Risk for activity intolerance will decrease Outcome: Progressing   Problem: Nutrition: Goal: Adequate nutrition will be maintained Outcome: Progressing   Problem: Coping: Goal: Level of anxiety will decrease Outcome: Progressing   Problem: Skin Integrity: Goal: Risk for impaired skin integrity will decrease Outcome: Progressing   "

## 2024-01-08 DIAGNOSIS — R569 Unspecified convulsions: Secondary | ICD-10-CM | POA: Diagnosis not present

## 2024-01-08 DIAGNOSIS — R4701 Aphasia: Secondary | ICD-10-CM | POA: Diagnosis not present

## 2024-01-08 DIAGNOSIS — I609 Nontraumatic subarachnoid hemorrhage, unspecified: Secondary | ICD-10-CM | POA: Diagnosis not present

## 2024-01-08 LAB — CBC
HCT: 38 % — ABNORMAL LOW (ref 39.0–52.0)
Hemoglobin: 12.6 g/dL — ABNORMAL LOW (ref 13.0–17.0)
MCH: 28.6 pg (ref 26.0–34.0)
MCHC: 33.2 g/dL (ref 30.0–36.0)
MCV: 86.2 fL (ref 80.0–100.0)
Platelets: 226 K/uL (ref 150–400)
RBC: 4.41 MIL/uL (ref 4.22–5.81)
RDW: 13.5 % (ref 11.5–15.5)
WBC: 10.3 K/uL (ref 4.0–10.5)
nRBC: 0 % (ref 0.0–0.2)

## 2024-01-08 NOTE — Progress Notes (Signed)
 [facility]  PROGRESS NOTE  Raymond Christensen FMW:[id number] DOB: 1940/05/31 DOA: 12/26/2023 PCP: Frann Mabel Mt, DO   Brief HPI:   83 year old male with past medical history of paroxysmal A-fib on Eliquis  last dose on day of presentation in the morning, CAD, heart failure with midrange ejection fraction 45% who presented to the hospital as a code stroke due to sudden onset expressive aphasia.   History obtained from patient, family and chart review.  Patient had a syncopal episode 6 days prior to his presentation and fell and hit the floor with bruising on his head around his right eye.  He is not able to recall what exactly happened.   On presentation to the hospital, he was noted to have significant expressive aphasia.  Code stroke and sent for head CT with findings concerning for acute left hemisphere hemorrhage with subarachnoid and intra-axial component.   Neurosurgery was consulted and they recommended repeat head CT in 6 hours.   Significant Events 12/12 head CT with acute L hemisphere hemorrhage with suspected subarachnoid and intraaxial components with regional hypodense edema Admitted to the ICU  12/26/23 12/13 unchanged L sided subarachnoid hemorrhage, subdural hemorrhage, and suspected L temporal intraparenchymal hemorrhage and edema 12/13 LTM EEG - seizure noted 12/14 neurology signed off 12/14 last neurosurgery note 12/15 orthostatic positive 12/16 [facility] assumed care    Post ICU care complicated by orthostatic hypotension limiting his ability to participate therapy.  We've started him on compression stockings, abdominal binder.  Started midodrine .     Discharge pending improvement in orthostasis.      Assessment/Plan:   Traumatic L Temporal ICH Traumatic SAH  Subdural Hemorrhage Expressive Aphasia  Seizures Head CT at presentation 12/12 with acute L hemisphere hemorrhage with suspected subarachnoid and intraaxial components with regional hypodense  edema CTA head/neck 12/12 negative for intracranial aneurysm, LVO, or CTA spot sign in association with acute intracranial hemorrhage - atherosclerosis including moderate to severe stenosis of the L ACA A1 segment, up to moderate bilateral supraclinoid ICA stenosis - previous L carotid endarterectomy CT 12/13 unchanged L sided subarachnoid hemorrhage, subdural hemorrhage and suspected L temporal intraparenchymal hemorrhage and edema Repeat head CT 12/19  with decreased SAH in L>R cerebral hemispheres, decreased hemorrhage in L temporal lobe with decreased edema in this region, unchanged subdural hematoma over L cerebral convexity, decreased small volume IVH in lateral ventricles MRI with subarachnoid hemorrhage involving the L greater than R cerebral hemispheres, subdural hematoma overlying the L cerebral convexity measuring up to 4 mm in maximal thickness without significant mass effect or  midline shift (12/20) we obtained this due to some word finding difficulty and transient encephalopathy - suspect this is due to hypotension while working with therapy in setting of his SAH.   Neurosurgery saw last on 12/14 Neurology signed off 12/14 - recommending keppra  1 g BID for seizures in setting of traumatic ICH, SDH, SAH, hypertensive emergency, coagulopathy.  Seizure precautions.  Neurology follow up in 8-12 weeks.    Per [place] statutes, patients with seizures are not allowed to drive until  they have been seizure-free for six months. Use caution when using heavy equipment or power tools. Avoid working on ladders or at heights. Take showers instead of baths. Ensure the water temperature is not too high on the home water heater. Do not go swimming alone. When caring for infants or small children, sit down when holding, feeding, or changing them to minimize risk of injury to the child in the event you  have a seizure. Also, Maintain good sleep hygiene. Avoid alcohol.  Positive FOBT -Had black tarry  stool last night -Stool for FOBT is positive - hemoglobin stable at 12.6 -Called and discussed with GI on-call Dr. Nancy Mcgreal, she recommends to continue monitoring hemoglobin. -Might need GI evaluation as outpatient -If hemoglobin drops continues to have black stool, will need intervention during this hospitalization    Orthostatic Hypotension Continue compression stockings, abdominal binder when OOB Metop low dose resumed due to RVR 12/18 Started on midodrine  2.5 mg 3 times daily; changed to midodrine  to 2.5 mg p.o. twice daily -Will try to wean off midodrine  over next few days Follow cortisol 12.2 (unlikely adrenal insufficiency -ACTH  stimulation test is normal, TSH (wnl) - Blood pressure improves post mobility with therapy    Atrial Fibrillation with RVR Eliquis  has been discontinued in the setting of above (per nsgy, if indicated, can resume in 2 weeks) Rates improved with IV metop, follow with oral metop  -Continue Toprol -XL 12.5 mg daily   HFmrEF Diastolic Dysfunction S/p mTEER procedure  Moderate Mitral Regurgitation Currently appears euvolemic  Caution with IVF   Hypothyroidism Synthroid , cytomel    BPH Overactive Bladder Proscar  Not taking gemtesa   Depression  Anxiety Continue paxil , ativan   Will discontinue wellbutrin  at discharge due to his seizure above   Dyslipidemia Statin, zetia  on hold       DVT prophylaxis: SCDs  Medications     docusate sodium   100 mg Oral BID   finasteride   5 mg Oral Daily   levETIRAcetam   1,000 mg Oral BID   levothyroxine   75 mcg Oral Daily   liothyronine   10 mcg Oral Daily   metoprolol  succinate  12.5 mg Oral Daily   midodrine   2.5 mg Oral BID WC   pantoprazole   40 mg Oral Daily   PARoxetine   10 mg Oral Daily   polyethylene glycol  17 g Oral BID   Prednisolon-Moxiflox-Bromfenac   1 drop Left Eye TID     Data Reviewed:   CBG:  Recent Labs  Lab 01/03/24 2213 01/03/24 2349 01/04/24 0345 01/04/24 0407  01/04/24 0844  GLUCAP 102* 105* 107* 105* 120*    SpO2: 97 %    Vitals:   01/08/24 0406 01/08/24 0817 01/08/24 1135 01/08/24 1535  BP: (!) 164/93 (!) 156/79 (!) 140/63 117/63  Pulse: 71 71 73 76  Resp: 19 18 17 16   Temp: 98.1 F (36.7 C) 98.5 F (36.9 C) 98.5 F (36.9 C) 98.4 F (36.9 C)  TempSrc: Oral     SpO2: 96% 96% 95% 97%  Weight:      Height:          Data Reviewed:  Basic Metabolic Panel: Recent Labs  Lab 01/02/24 0606 01/03/24 0907 01/04/24 0552  NA 131* 132* 134*  K 4.0 4.0 3.8  CL 99 100 101  CO2 24 20* 26  GLUCOSE 102* 103* 99  BUN 19 15 14   CREATININE 0.75 0.67 0.67  CALCIUM  8.7* 8.7* 8.7*  MG 1.9 1.8 1.8  PHOS 2.8 3.2 3.2    CBC: Recent Labs  Lab 01/02/24 0606 01/03/24 0907 01/04/24 0552 01/07/24 0256 01/07/24 0604 01/08/24 0641  WBC 11.8* 9.6 9.7 11.2* 10.2 10.3  NEUTROABS 7.9* 6.3 6.2  --   --   --   HGB 12.3* 12.9* 12.5* 12.3* 12.5* 12.6*  HCT 36.9* 39.4 37.1* 37.1* 37.4* 38.0*  MCV 86.4 88.1 85.9 86.9 86.8 86.2  PLT 189 201 199 224 210 226  LFT Recent Labs  Lab 01/02/24 0606 01/03/24 0907 01/04/24 0552  AST 18 25 19   ALT 19 28 26   ALKPHOS 74 77 79  BILITOT 0.6 0.6 0.6  PROT 6.0* 5.8* 5.6*  ALBUMIN  3.5 3.4* 3.4*     Antibiotics: Anti-infectives (From admission, onward)    None        CONSULTS   Code Status: Full code  Family Communication: Called and discussed with patient's wife at bedside     Subjective   Denies any complaints.  No dizziness.  Blood pressure has improved.   Objective    Physical Examination:  Appears in no acute distress S1-S2, regular, no murmur auscultated Lungs are clear to auscultation bilaterally Abdomen is soft, nontender, no organomegaly            Monalisa Bayless S Dorcas Melito   [facility] If 7PM-7AM, please contact night-coverage at [email or web address], Office  [phone]   01/08/2024, 3:48 PM  LOS: 13 days

## 2024-01-09 ENCOUNTER — Other Ambulatory Visit (HOSPITAL_COMMUNITY): Payer: Self-pay

## 2024-01-09 NOTE — Progress Notes (Addendum)
 Physical Therapy Treatment Patient Details Name: Raymond Christensen MRN: [id number] DOB: 1940-08-27 Today's Date: 01/09/2024   History of Present Illness 83 y.o. M presented to the hospital as a code stroke 12/12 due to sudden onset expressive aphasia. Found to have acute left hemisphere hemorrhage with subarachnoid and intra-axial component. Did have a fall 6 days PTA. Repeat imaging 12/19: MRI Brain (-) for infarct and no other acute intracranial abnormality; CT Head showed decreased SAH involving L>R cerebral hemispheres, decreased hemorrhage in left temporal lobe with decreased edema in this region, unchanged subdural hematoma over left cerebral convexity, and decreased small volume intraventricular hemorrhage in the lateral ventricles. PHMx: paroxysmal A-fib,  CAD, heart failure with midrange ejection fraction 45%, HTN, DM, anxiety, NSTEMI, memory change, OA, skin CA left arm, vaso vagal responses to GI issues.    PT Comments  Pt supine in bed on arrival this session.  Pt reports feeling frustrated this pm but demeanor improved as tx progressed. Pt continues to present with orthostatics.  Pt eager to mobilize, negotiate x 4 stairs.  Continues to present with minor safety concerns and required cues for reminders for hand placement and locking/unlocking rollator brakes.  Pt continues to benefit from rehab post acutely in the home setting.   Orthostatic VS for the past 24 hrs:  BP- Lying Pulse- Lying BP- Sitting Pulse- Sitting BP- Standing at 0 minutes Pulse- Standing at 0 minutes  01/09/24 1331 127/64 64 122/79 80 93/53 66    After 3 min in standing 108/55 Pulse-67bpm   If plan is discharge home, recommend the following: A little help with walking and/or transfers;A little help with bathing/dressing/bathroom;Assistance with cooking/housework;Assist for transportation;Direct supervision/assist for financial management;Help with stairs or ramp for entrance   Can travel by private vehicle         Equipment Recommendations  Rollator (4 wheels)    Recommendations for Other Services       Precautions / Restrictions Precautions Precautions: Fall Recall of Precautions/Restrictions: Intact Precaution/Restrictions Comments: orthostatic; TED hose and abdominal binder Restrictions Weight Bearing Restrictions Per Provider Order: No     Mobility  Bed Mobility Overal bed mobility: Needs Assistance Bed Mobility: Supine to Sit, Sit to Supine     Supine to sit: Supervision, HOB elevated, Used rails     General bed mobility comments: Increased time and effort but able to rise into sitting unsupported.  Reports mild dizziness this session.    Transfers Overall transfer level: Needs assistance Equipment used: Rollator (4 wheels) Transfers: Sit to/from Stand Sit to Stand: Supervision           General transfer comment: Cues/ reminders to lock and unlock brakes on rollator.  Poor hand placement to rise into standing.    Ambulation/Gait Ambulation/Gait assistance: Supervision Gait Distance (Feet): 300 Feet Assistive device: Rollator (4 wheels) Gait Pattern/deviations: Step-through pattern, Decreased stride length, Trunk flexed Gait velocity: slightly decreased     General Gait Details: Pt reports mild dizziness but reports it gets better the more he is up.  Abdominal binder and TED hose in place.   Stairs Stairs: Yes Stairs assistance: Supervision Stair Management: Two rails, Alternating pattern, Forwards Number of Stairs: 4 General stair comments: Cues for safety.  Decreased coordination descending stairs.   Wheelchair Mobility     Tilt Bed    Modified Rankin (Stroke Patients Only) Modified Rankin (Stroke Patients Only) Pre-Morbid Rankin Score: No symptoms Modified Rankin: Moderately severe disability     Balance Overall balance assessment: Needs assistance Sitting-balance support:  No upper extremity supported, Feet supported Sitting balance-Leahy  Scale: Good Sitting balance - Comments: sitting unsupported from backrest of chair     Standing balance-Leahy Scale: Fair Standing balance comment: reliant on rollator, but can remove BUE for brief periods of time without LOB to simulate ADL performance                            Communication Communication Communication: No apparent difficulties Factors Affecting Communication: Difficulty expressing self  Cognition Arousal: Alert Behavior During Therapy: WFL for tasks assessed/performed   PT - Cognitive impairments: Problem solving, Initiation, Memory, Attention, Awareness, Safety/Judgement                       PT - Cognition Comments: likes to joke Following commands: Intact Following commands impaired: Follows multi-step commands with increased time    Cueing Cueing Techniques: Verbal cues  Exercises      General Comments        Pertinent Vitals/Pain Pain Assessment Pain Assessment: No/denies pain    Home Living                          Prior Function            PT Goals (current goals can now be found in the care plan section) Acute Rehab PT Goals Patient Stated Goal: to return home Potential to Achieve Goals: Good Progress towards PT goals: Progressing toward goals    Frequency    Min 3X/week      PT Plan      Co-evaluation              AM-PAC PT 6 Clicks Mobility   Outcome Measure  Help needed turning from your back to your side while in a flat bed without using bedrails?: A Little Help needed moving from lying on your back to sitting on the side of a flat bed without using bedrails?: A Little Help needed moving to and from a bed to a chair (including a wheelchair)?: A Little Help needed standing up from a chair using your arms (e.g., wheelchair or bedside chair)?: A Little Help needed to walk in hospital room?: A Little Help needed climbing 3-5 steps with a railing? : A Little 6 Click Score: 18    End  of Session Equipment Utilized During Treatment: Gait belt Activity Tolerance: Patient tolerated treatment well Patient left: with call bell/phone within reach;with family/visitor present;in chair;with chair alarm set Nurse Communication: Mobility status PT Visit Diagnosis: Difficulty in walking, not elsewhere classified (R26.2);Other abnormalities of gait and mobility (R26.89);Unsteadiness on feet (R26.81);Muscle weakness (generalized) (M62.81)     Time: 8745-8671 PT Time Calculation (min) (ACUTE ONLY): 34 min  Charges:    $Gait Training: 8-22 mins $Therapeutic Activity: 8-22 mins PT General Charges $$ ACUTE PT VISIT: 1 Visit                     Toya HAMS , PTA Acute Rehabilitation Services Office [phone]    Jonetta Dagley JINNY Gosling 01/09/2024, 1:40 PM

## 2024-01-09 NOTE — [place] FL2 (Signed)
 " [place]  MEDICAID FL2 LEVEL OF CARE FORM     IDENTIFICATION  Patient Name: Raymond Christensen Birthdate: Aug 18, 1940 Sex: male Admission Date (Current Location): 12/26/2023  [facility] and [place] Number:  Producer, Television/film/video and Address:  [facility] [place], [address], [place], [place] [address]      Provider Number: [id number]  Attending Physician Name and Address:  Drusilla Sabas RAMAN, MD  Relative Name and Phone Number:       Current Level of Care: Hospital Recommended Level of Care: Skilled Nursing Facility Prior Approval Number:    Date Approved/Denied:   PASRR Number: [id number]  Discharge Plan: SNF    Current Diagnoses: Patient Active Problem List   Diagnosis Date Noted   Expressive aphasia 12/28/2023   Seizure (HCC) 12/28/2023   Subarachnoid hemorrhage (HCC) 12/26/2023   GAD (generalized anxiety disorder) 01/10/2023   Pain in left wrist 11/20/2022   S/P CABG (coronary artery bypass graft) 06/27/2022   Hypokalemia 06/27/2022   NSTEMI (non-ST elevated myocardial infarction) (HCC) 06/26/2022   PVC's (premature ventricular contractions) 05/31/2022   Lipoma of right upper extremity 03/02/2021   Trigger little finger of left hand 09/29/2020   Secondary hypercoagulable state 02/29/2020   Depression, major, single episode, moderate (HCC) 02/28/2020   Nonrheumatic tricuspid valve regurgitation    S/P mitral valve clip implantation 12/23/2019   Mitral valve disease 12/23/2019   Nonrheumatic mitral valve regurgitation    Persistent atrial fibrillation (HCC)    Chronic HFrEF    Acute idiopathic gout involving toe of left foot    Thrombocytopenia 09/08/2019   CAD (coronary artery disease)    Type 2 diabetes mellitus in patient with obesity (HCC) 06/22/2019   Major depressive disorder    Obstructive sleep apnea on CPAP    Hypothyroidism 10/15/2017   Subclavian artery stenosis, right 06/15/2017   Severe obesity (BMI 35.0-39.9) with comorbidity (HCC)  02/26/2017   Essential hypertension 02/26/2017   Memory change 01/10/2017   Urge incontinence of urine 01/10/2017   Lightheaded 06/25/2016   AK (actinic keratosis) 01/03/2016   H/O syncope 03/06/2014   Occlusion and stenosis of left carotid artery 01/11/2014   Routine history and physical examination of adult 11/08/2013   History of repair of hip joint 04/20/2013   Hyperlipidemia 04/20/2013   Presence of artificial hip joint 04/20/2013   Vitamin D  deficiency 06/18/2010   Concussion 01/24/2010   Elevated prostate specific antigen (PSA) 01/01/2007   Benign prostatic hyperplasia without urinary obstruction 04/22/2006   Osteoarthrosis 04/22/2006   Dyslipidemia 04/02/2006    Orientation RESPIRATION BLADDER Height & Weight     Self, Time, Situation, Place  Normal Continent Weight: 198 lb 10.2 oz (90.1 kg) Height:  5' 8 (172.7 cm)  BEHAVIORAL SYMPTOMS/MOOD NEUROLOGICAL BOWEL NUTRITION STATUS      Continent Diet (heart healthy)  AMBULATORY STATUS COMMUNICATION OF NEEDS Skin   Supervision Verbally Normal                       Personal Care Assistance Level of Assistance  Bathing, Feeding, Dressing Bathing Assistance: Limited assistance Feeding assistance: Independent Dressing Assistance: Limited assistance     Functional Limitations Info  Sight, Hearing Sight Info: Impaired Hearing Info: Impaired      SPECIAL CARE FACTORS FREQUENCY  PT (By licensed PT), OT (By licensed OT)     PT Frequency: eval and treat OT Frequency: eval and treat  Contractures Contractures Info: Not present    Additional Factors Info  Code Status, Allergies Code Status Info: Full Allergies Info: Sulfa Antibiotics           Current Medications (01/09/2024):  This is the current hospital active medication list Current Facility-Administered Medications  Medication Dose Route Frequency Provider Last Rate Last Admin   acetaminophen  (TYLENOL ) tablet 650 mg  650 mg Oral Q4H PRN  Hattar, Zola SAILOR, MD   650 mg at 01/08/24 1503   Or   acetaminophen  (TYLENOL ) 160 MG/5ML solution 650 mg  650 mg Per Tube Q4H PRN Hattar, Zola SAILOR, MD       Or   acetaminophen  (TYLENOL ) suppository 650 mg  650 mg Rectal Q4H PRN Hattar, Zola SAILOR, MD       docusate sodium  (COLACE) capsule 100 mg  100 mg Oral BID Hattar, Zola SAILOR, MD   100 mg at 01/04/24 2129   finasteride  (PROSCAR ) tablet 5 mg  5 mg Oral Daily Alva, Rakesh V, MD   5 mg at 01/09/24 9048   levETIRAcetam  (KEPPRA ) tablet 1,000 mg  1,000 mg Oral BID Arora, Ashish, MD   1,000 mg at 01/09/24 9048   levothyroxine  (SYNTHROID ) tablet 75 mcg  75 mcg Oral Daily Alva, Rakesh V, MD   75 mcg at 01/09/24 0603   liothyronine  (CYTOMEL ) tablet 10 mcg  10 mcg Oral Daily Alva, Rakesh V, MD   10 mcg at 01/09/24 0952   LORazepam  (ATIVAN ) tablet 0.5 mg  0.5 mg Oral BID PRN Alva, Rakesh V, MD   0.5 mg at 12/30/23 2227   metoprolol  succinate (TOPROL -XL) 24 hr tablet 12.5 mg  12.5 mg Oral Daily Lama, Gagan S, MD   12.5 mg at 01/09/24 9046   metoprolol  tartrate (LOPRESSOR ) injection 2.5 mg  2.5 mg Intravenous Q5 min PRN Perri DELENA Meliton Mickey., MD   2.5 mg at 01/01/24 1241   midodrine  (PROAMATINE ) tablet 2.5 mg  2.5 mg Oral BID WC Drusilla Sabas RAMAN, MD   2.5 mg at 01/09/24 9078   ondansetron  (ZOFRAN -ODT) disintegrating tablet 4 mg  4 mg Oral Q6H PRN Zaida Zola SAILOR, MD   4 mg at 01/01/24 0900   Or   ondansetron  (ZOFRAN ) injection 4 mg  4 mg Intravenous Q6H PRN Hattar, Zola SAILOR, MD   4 mg at 12/31/23 0157   pantoprazole  (PROTONIX ) EC tablet 40 mg  40 mg Oral Daily Hattar, Zola SAILOR, MD   40 mg at 01/09/24 9048   PARoxetine  (PAXIL ) tablet 10 mg  10 mg Oral Daily Alva, Rakesh V, MD   10 mg at 01/09/24 0952   polyethylene glycol (MIRALAX  / GLYCOLAX ) packet 17 g  17 g Oral BID Perri DELENA Meliton Mickey., MD   17 g at 01/04/24 2128   Prednisolon-Moxiflox-Bromfenac  1-0.5-0.075 % SOLN 1 drop  1 drop Left Eye TID Perri DELENA Meliton Mickey., MD   1 drop at 01/09/24 1539     Discharge  Medications: Please see discharge summary for a list of discharge medications.  Relevant Imaging Results:  Relevant Lab Results:   Additional Information SS#: [id number]  Almarie CHRISTELLA Goodie, LCSW     "

## 2024-01-09 NOTE — Progress Notes (Signed)
 [facility]  PROGRESS NOTE  Raymond Christensen FMW:[id number] DOB: 03-30-1940 DOA: 12/26/2023 PCP: Frann Mabel Mt, DO   Brief HPI:   83 year old male with past medical history of paroxysmal A-fib on Eliquis  last dose on day of presentation in the morning, CAD, heart failure with midrange ejection fraction 45% who presented to the hospital as a code stroke due to sudden onset expressive aphasia.   History obtained from patient, family and chart review.  Patient had a syncopal episode 6 days prior to his presentation and fell and hit the floor with bruising on his head around his right eye.  He is not able to recall what exactly happened.   On presentation to the hospital, he was noted to have significant expressive aphasia.  Code stroke and sent for head CT with findings concerning for acute left hemisphere hemorrhage with subarachnoid and intra-axial component.   Neurosurgery was consulted and they recommended repeat head CT in 6 hours.   Significant Events 12/12 head CT with acute L hemisphere hemorrhage with suspected subarachnoid and intraaxial components with regional hypodense edema Admitted to the ICU  12/26/23 12/13 unchanged L sided subarachnoid hemorrhage, subdural hemorrhage, and suspected L temporal intraparenchymal hemorrhage and edema 12/13 LTM EEG - seizure noted 12/14 neurology signed off 12/14 last neurosurgery note 12/15 orthostatic positive 12/16 [facility] assumed care    Post ICU care complicated by orthostatic hypotension limiting his ability to participate therapy.  We've started him on compression stockings, abdominal binder.  Started midodrine .     Discharge pending improvement in orthostasis.      Assessment/Plan:   Traumatic L Temporal ICH Traumatic SAH  Subdural Hemorrhage Expressive Aphasia  Seizures Head CT at presentation 12/12 with acute L hemisphere hemorrhage with suspected subarachnoid and intraaxial components with regional hypodense  edema CTA head/neck 12/12 negative for intracranial aneurysm, LVO, or CTA spot sign in association with acute intracranial hemorrhage - atherosclerosis including moderate to severe stenosis of the L ACA A1 segment, up to moderate bilateral supraclinoid ICA stenosis - previous L carotid endarterectomy CT 12/13 unchanged L sided subarachnoid hemorrhage, subdural hemorrhage and suspected L temporal intraparenchymal hemorrhage and edema Repeat head CT 12/19  with decreased SAH in L>R cerebral hemispheres, decreased hemorrhage in L temporal lobe with decreased edema in this region, unchanged subdural hematoma over L cerebral convexity, decreased small volume IVH in lateral ventricles MRI with subarachnoid hemorrhage involving the L greater than R cerebral hemispheres, subdural hematoma overlying the L cerebral convexity measuring up to 4 mm in maximal thickness without significant mass effect or  midline shift (12/20) we obtained this due to some word finding difficulty and transient encephalopathy - suspect this is due to hypotension while working with therapy in setting of his SAH.   Neurosurgery saw last on 12/14 Neurology signed off 12/14 - recommending keppra  1 g BID for seizures in setting of traumatic ICH, SDH, SAH, hypertensive emergency, coagulopathy.  Seizure precautions.  Neurology follow up in 8-12 weeks.    Per [place] statutes, patients with seizures are not allowed to drive until  they have been seizure-free for six months. Use caution when using heavy equipment or power tools. Avoid working on ladders or at heights. Take showers instead of baths. Ensure the water temperature is not too high on the home water heater. Do not go swimming alone. When caring for infants or small children, sit down when holding, feeding, or changing them to minimize risk of injury to the child in the event you  have a seizure. Also, Maintain good sleep hygiene. Avoid alcohol.  Positive FOBT -Had black tarry  stool last night -Stool for FOBT is positive - hemoglobin stable at 12.6 -Called and discussed with GI on-call Dr. Nancy Mcgreal, she recommends to continue monitoring hemoglobin. -Might need GI evaluation as outpatient -If hemoglobin drops continues to have black stool, will need intervention during this hospitalization - Follow CBC in a.m.    Orthostatic Hypotension Continue compression stockings, abdominal binder when OOB Metop low dose resumed due to RVR 12/18 Started on midodrine  2.5 mg 3 times daily; changed to midodrine  to 2.5 mg p.o. twice daily -Will try to wean off midodrine  over next few days Follow cortisol 12.2 (unlikely adrenal insufficiency -ACTH  stimulation test is normal, TSH (wnl) - Blood pressure improves post mobility with therapy - Still having episodes of orthostatic hypotension    Atrial Fibrillation with RVR Eliquis  has been discontinued in the setting of above (per nsgy, if indicated, can resume in 2 weeks) Rates improved with IV metop, follow with oral metop  -Continue Toprol -XL 12.5 mg daily   HFmrEF Diastolic Dysfunction S/p mTEER procedure  Moderate Mitral Regurgitation Currently appears euvolemic  Caution with IVF   Hypothyroidism Synthroid , cytomel    BPH Overactive Bladder Proscar  Not taking gemtesa   Depression  Anxiety Continue paxil , ativan   Will discontinue wellbutrin  at discharge due to his seizure above   Dyslipidemia Statin, zetia  on hold       DVT prophylaxis: SCDs  Medications     docusate sodium   100 mg Oral BID   finasteride   5 mg Oral Daily   levETIRAcetam   1,000 mg Oral BID   levothyroxine   75 mcg Oral Daily   liothyronine   10 mcg Oral Daily   metoprolol  succinate  12.5 mg Oral Daily   midodrine   2.5 mg Oral BID WC   pantoprazole   40 mg Oral Daily   PARoxetine   10 mg Oral Daily   polyethylene glycol  17 g Oral BID   Prednisolon-Moxiflox-Bromfenac   1 drop Left Eye TID     Data Reviewed:   CBG:  Recent  Labs  Lab 01/03/24 2213 01/03/24 2349 01/04/24 0345 01/04/24 0407 01/04/24 0844  GLUCAP 102* 105* 107* 105* 120*    SpO2: 95 %    Vitals:   01/08/24 1928 01/08/24 2343 01/09/24 0359 01/09/24 0847  BP: (!) 114/52 (!) 149/88 (!) 141/63 (!) 143/63  Pulse: 72 89 72 73  Resp: 18 17 18 18   Temp: 98 F (36.7 C) 97.8 F (36.6 C) 97.7 F (36.5 C)   TempSrc: Oral Oral Oral   SpO2: 98% 95% 94% 95%  Weight:      Height:          Data Reviewed:  Basic Metabolic Panel: Recent Labs  Lab 01/03/24 0907 01/04/24 0552  NA 132* 134*  K 4.0 3.8  CL 100 101  CO2 20* 26  GLUCOSE 103* 99  BUN 15 14  CREATININE 0.67 0.67  CALCIUM  8.7* 8.7*  MG 1.8 1.8  PHOS 3.2 3.2    CBC: Recent Labs  Lab 01/03/24 0907 01/04/24 0552 01/07/24 0256 01/07/24 0604 01/08/24 0641  WBC 9.6 9.7 11.2* 10.2 10.3  NEUTROABS 6.3 6.2  --   --   --   HGB 12.9* 12.5* 12.3* 12.5* 12.6*  HCT 39.4 37.1* 37.1* 37.4* 38.0*  MCV 88.1 85.9 86.9 86.8 86.2  PLT 201 199 224 210 226    LFT Recent Labs  Lab 01/03/24 0907 01/04/24 0552  AST 25 19  ALT 28 26  ALKPHOS 77 79  BILITOT 0.6 0.6  PROT 5.8* 5.6*  ALBUMIN  3.4* 3.4*     Antibiotics: Anti-infectives (From admission, onward)    None        CONSULTS   Code Status: Full code  Family Communication: Called and discussed with patient's wife at bedside     Subjective   Patient seen and examined, no new complaints.  Dizziness is improved   Objective    Physical Examination:  General-appears in no acute distress Heart-S1-S2, regular, no murmur auscultated Lungs-clear to auscultation bilaterally, no wheezing or crackles auscultated Abdomen-soft, nontender, no organomegaly Extremities-no edema in the lower extremities Neuro-alert, oriented x3, no focal deficit noted            Raymond Christensen   [facility] If 7PM-7AM, please contact night-coverage at [email or web address], Office  [phone]   01/09/2024, 9:35 AM   LOS: 14 days

## 2024-01-09 NOTE — Plan of Care (Signed)
" °  Problem: Spontaneous Subarachnoid Hemorrhage Tissue Perfusion: Goal: Complications of Spontaneous Subarachnoid Hemorrhage will be minimized Outcome: Not Progressing   Problem: Self-Care: Goal: Ability to participate in self-care as condition permits will improve Outcome: Not Progressing Goal: Verbalization of feelings and concerns over difficulty with self-care will improve Outcome: Not Progressing Goal: Ability to communicate needs accurately will improve Outcome: Not Progressing   Problem: Clinical Measurements: Goal: Cardiovascular complication will be avoided Outcome: Not Progressing   Problem: Safety: Goal: Ability to remain free from injury will improve Outcome: Not Progressing   Problem: Skin Integrity: Goal: Risk for impaired skin integrity will decrease Outcome: Not Progressing   "

## 2024-01-09 NOTE — Progress Notes (Signed)
" ° °  Inpatient Rehabilitation Admissions Coordinator   I continue to await insurance determination for possible CIR admit. Continue to note that therapy is recommending HH and family/SW navigator, Alfonso, is aware of that recommendation.  Heron Leavell, RN, MSN Rehab Admissions Coordinator [phone] 01/09/2024 1:02 PM  "

## 2024-01-09 NOTE — Plan of Care (Signed)
 OOB to chair with assistance. PT worked with him. ABD binder in use when OOB.     Problem: Education: Goal: Knowledge of disease or condition will improve Outcome: Progressing   Problem: Coping: Goal: Will verbalize positive feelings about self Outcome: Progressing   Problem: Pain Managment: Goal: General experience of comfort will improve and/or be controlled Outcome: Progressing   Problem: Safety: Goal: Ability to remain free from injury will improve Outcome: Progressing   Problem: Skin Integrity: Goal: Risk for impaired skin integrity will decrease Outcome: Progressing

## 2024-01-09 NOTE — Progress Notes (Signed)
" ° °  Inpatient Rehabilitation Admissions Coordinator   We have received a denial after peer to peer with [facility] medicare MD with Dr Drusilla. I met with patient at bedside, called his wife , [name], by phone. I spoke with Alfonso, SW with [facility] navigator and she is aware.I will alert TOC that they wish to pursue SNF.    Heron Leavell, RN, MSN Rehab Admissions Coordinator [phone] 01/09/2024 2:59 PM  "

## 2024-01-10 LAB — CBC
HCT: 37.9 % — ABNORMAL LOW (ref 39.0–52.0)
Hemoglobin: 12.4 g/dL — ABNORMAL LOW (ref 13.0–17.0)
MCH: 28.4 pg (ref 26.0–34.0)
MCHC: 32.7 g/dL (ref 30.0–36.0)
MCV: 86.9 fL (ref 80.0–100.0)
Platelets: 200 K/uL (ref 150–400)
RBC: 4.36 MIL/uL (ref 4.22–5.81)
RDW: 13.5 % (ref 11.5–15.5)
WBC: 10.3 K/uL (ref 4.0–10.5)
nRBC: 0 % (ref 0.0–0.2)

## 2024-01-10 NOTE — Plan of Care (Signed)
" °  Problem: Education: Goal: Knowledge of disease or condition will improve 01/10/2024 1619 by Quin Tacy BRAVO, RN Outcome: Progressing 01/10/2024 1617 by Quin Tacy BRAVO, RN Outcome: Progressing Goal: Knowledge of secondary prevention will improve (MUST DOCUMENT ALL) 01/10/2024 1619 by Quin Tacy BRAVO, RN Outcome: Progressing 01/10/2024 1617 by Quin Tacy BRAVO, RN Outcome: Progressing Goal: Knowledge of patient specific risk factors will improve (DELETE if not current risk factor) Outcome: Progressing   Problem: Coping: Goal: Will verbalize positive feelings about self Outcome: Progressing Goal: Will identify appropriate support needs Outcome: Progressing   Problem: Self-Care: Goal: Ability to participate in self-care as condition permits will improve Outcome: Progressing   "

## 2024-01-10 NOTE — Progress Notes (Addendum)
 Physical Therapy Treatment Patient Details Name: Raymond Christensen MRN: [id number] DOB: [birth year] Today's Date: 01/10/2024   History of Present Illness 83 y.o. M presented to the hospital as a code stroke 12/12 due to sudden onset expressive aphasia. Found to have acute left hemisphere hemorrhage with subarachnoid and intra-axial component. Did have a fall 6 days PTA. Repeat imaging 12/19: MRI Brain (-) for infarct and no other acute intracranial abnormality; CT Head showed decreased SAH involving L>R cerebral hemispheres, decreased hemorrhage in left temporal lobe with decreased edema in this region, unchanged subdural hematoma over left cerebral convexity, and decreased small volume intraventricular hemorrhage in the lateral ventricles. PHMx: paroxysmal A-fib,  CAD, heart failure with midrange ejection fraction 45%, HTN, DM, anxiety, NSTEMI, memory change, OA, skin CA left arm, vaso vagal responses to GI issues.    PT Comments  Pt received in supine and agreeable to session. Pt reports no dizziness during session and BP WFL throughout with TED hose and abdominal binder donned. Pt able to perform gait and stair trials with supervision for safety. Pt able to perform BLE step ups for strengthening. Discussed home set up and reducing fall risk with pt reporting main concern is balance during ambulation. Pt continues to benefit from PT services to progress toward functional mobility goals.    Orthostatic BPs  Supine 134/72  Sitting 122/76  Standing 116/71  Sitting post gait 130/90      If plan is discharge home, recommend the following: A little help with walking and/or transfers;A little help with bathing/dressing/bathroom;Assistance with cooking/housework;Assist for transportation;Direct supervision/assist for financial management;Help with stairs or ramp for entrance   Can travel by private vehicle        Equipment Recommendations  Rollator (4 wheels)    Recommendations for Other Services        Precautions / Restrictions Precautions Precautions: Fall Recall of Precautions/Restrictions: Intact Precaution/Restrictions Comments: orthostatic; TED hose and abdominal binder Restrictions Weight Bearing Restrictions Per Provider Order: No     Mobility  Bed Mobility Overal bed mobility: Needs Assistance Bed Mobility: Supine to Sit     Supine to sit: Supervision, HOB elevated, Used rails     General bed mobility comments: increased time    Transfers Overall transfer level: Needs assistance Equipment used: Rollator (4 wheels) Transfers: Sit to/from Stand Sit to Stand: Supervision           General transfer comment: cues to lock rollator brakes    Ambulation/Gait Ambulation/Gait assistance: Supervision Gait Distance (Feet): 250 Feet Assistive device: Rollator (4 wheels) Gait Pattern/deviations: Step-through pattern, Decreased stride length Gait velocity: slightly decreased     General Gait Details: No reports of dizziness and slow, but steady gait   Stairs Stairs: Yes Stairs assistance: Supervision Stair Management: Two rails, Alternating pattern, Forwards Number of Stairs: 2 General stair comments: No overt LOB   Wheelchair Mobility     Tilt Bed    Modified Rankin (Stroke Patients Only) Modified Rankin (Stroke Patients Only) Pre-Morbid Rankin Score: No symptoms Modified Rankin: Moderately severe disability     Balance Overall balance assessment: Needs assistance Sitting-balance support: No upper extremity supported, Feet supported Sitting balance-Leahy Scale: Good     Standing balance support: Bilateral upper extremity supported, During functional activity Standing balance-Leahy Scale: Fair Standing balance comment: with rollator support                            Communication Communication Communication: No apparent difficulties  Cognition Arousal: Alert Behavior During Therapy: WFL for tasks assessed/performed   PT  - Cognitive impairments: Problem solving, Memory, Attention, Awareness, Safety/Judgement                         Following commands: Intact Following commands impaired: Follows multi-step commands with increased time    Cueing Cueing Techniques: Verbal cues  Exercises Other Exercises Other Exercises: BLE step ups x10 each    General Comments General comments (skin integrity, edema, etc.): HR up to 140's with mobility      Pertinent Vitals/Pain Pain Assessment Pain Assessment: No/denies pain     PT Goals (current goals can now be found in the care plan section) Acute Rehab PT Goals Patient Stated Goal: to return home PT Goal Formulation: With patient Time For Goal Achievement: 01/10/24 Progress towards PT goals: Progressing toward goals    Frequency    Min 3X/week       AM-PAC PT 6 Clicks Mobility   Outcome Measure  Help needed turning from your back to your side while in a flat bed without using bedrails?: A Little Help needed moving from lying on your back to sitting on the side of a flat bed without using bedrails?: A Little Help needed moving to and from a bed to a chair (including a wheelchair)?: A Little Help needed standing up from a chair using your arms (e.g., wheelchair or bedside chair)?: A Little Help needed to walk in hospital room?: A Little Help needed climbing 3-5 steps with a railing? : A Little 6 Click Score: 18    End of Session Equipment Utilized During Treatment: Gait belt Activity Tolerance: Patient tolerated treatment well Patient left: with call bell/phone within reach;in chair;with chair alarm set Nurse Communication: Mobility status PT Visit Diagnosis: Difficulty in walking, not elsewhere classified (R26.2);Other abnormalities of gait and mobility (R26.89);Unsteadiness on feet (R26.81);Muscle weakness (generalized) (M62.81)     Time: 9054-8983 PT Time Calculation (min) (ACUTE ONLY): 31 min  Charges:    $Gait Training:  23-37 mins PT General Charges $$ ACUTE PT VISIT: 1 Visit                    Darryle George, PTA Acute Rehabilitation Services Secure Chat Preferred  [phone]    Darryle George 01/10/2024, 11:02 AM

## 2024-01-10 NOTE — TOC Progression Note (Signed)
 Transition of Care [facility]) - Progression Note    Patient Details  Name: Raymond Christensen MRN: [id number] Date of Birth: 1940/08/15  [facility] of [facility]) CM/SW Contact  Erial Fikes [place], [place] Phone Number: 01/10/2024, 8:24 AM  Clinical Narrative:  Pt's insurance is offering a peer-to-peer review for SNF--deadline is 12/29 by 12pm. MD provided with information needed to completed review. SW will follow.   Julien Das, MSW, LCSW [phone] (coverage)       Expected Discharge Plan: Home w Home Health Services Barriers to Discharge: Continued Medical Work up               Expected Discharge Plan and Services   Discharge Planning Services: CM Consult Post Acute Care Choice: Home Health, Durable Medical Equipment Living arrangements for the past 2 months: Apartment                 DME Arranged: Vannie rolling with seat DME Agency: [employer] Date DME Agency Contacted: 01/01/24   Representative spoke with at DME Agency: [place] HH Arranged: PT, OT HH Agency:  [employer] Work[facility]) Date HH Agency Contacted: 01/01/24   Representative spoke with at [facility] Agency: [facility]   Social Drivers of Health (SDOH) Interventions SDOH Screenings   Food Insecurity: No Food Insecurity (12/31/2023)  Housing: Low Risk (12/31/2023)  Transportation Needs: No Transportation Needs (12/31/2023)  Utilities: Not At Risk (12/31/2023)  Alcohol Screen: Low Risk (07/31/2022)  Depression (PHQ2-9): Low Risk (12/22/2023)  Financial Resource Strain: Low Risk (07/31/2022)  Physical Activity: Inactive (07/31/2022)  Social Connections: Unknown (12/31/2023)  Stress: No Stress Concern Present (07/31/2022)  Tobacco Use: Medium Risk (12/26/2023)  Health Literacy: Adequate Health Literacy (07/31/2022)    Readmission Risk Interventions     No data to display

## 2024-01-10 NOTE — TOC Progression Note (Signed)
 Transition of Care [facility]) - Progression Note    Patient Details  Name: Raymond Christensen MRN: [id number] Date of Birth: Jan 22, 1940  [facility]) CM/SW Contact  Gwenn Frieze [place], [place] Phone Number: 01/10/2024, 11:15 AM  Clinical Narrative: MD completed peer-to-peer for SNF which has been denied. Updated pt's wife, son, and DIL. Voicemail left for [facility] Navigator Alfonso Holms at family's request. Per family, Alfonso is working to arrange additional assist at [facility] and plan is for dc back to [place] Monday. MD updated.   Frieze Gwenn, MSW, LCSW [phone] (coverage)         Expected Discharge Plan: Home w Home Health Services Barriers to Discharge: Continued Medical Work up               Expected Discharge Plan and Services   Discharge Planning Services: CM Consult Post Acute Care Choice: Home Health, Durable Medical Equipment Living arrangements for the past 2 months: Apartment                 DME Arranged: Vannie rolling with seat DME Agency: [employer] Date DME Agency Contacted: 01/01/24   Representative spoke with at DME Agency: [place] HH Arranged: PT, OT HH Agency:  [employer]/development Worker) Date HH Agency Contacted: 01/01/24   Representative spoke with at [facility] Agency: [facility]   Social Drivers of Health (SDOH) Interventions SDOH Screenings   Food Insecurity: No Food Insecurity (12/31/2023)  Housing: Low Risk (12/31/2023)  Transportation Needs: No Transportation Needs (12/31/2023)  Utilities: Not At Risk (12/31/2023)  Alcohol Screen: Low Risk (07/31/2022)  Depression (PHQ2-9): Low Risk (12/22/2023)  Financial Resource Strain: Low Risk (07/31/2022)  Physical Activity: Inactive (07/31/2022)  Social Connections: Unknown (12/31/2023)  Stress: No Stress Concern Present (07/31/2022)  Tobacco Use: Medium Risk (12/26/2023)  Health Literacy: Adequate Health Literacy (07/31/2022)    Readmission Risk Interventions     No data to display

## 2024-01-10 NOTE — Progress Notes (Signed)
 [facility]  PROGRESS NOTE  Raymond Christensen FMW:[id number] DOB: 02/13/1940 DOA: 12/26/2023 PCP: Frann Mabel Mt, DO   Brief HPI:   83 year old male with past medical history of paroxysmal A-fib on Eliquis  last dose on day of presentation in the morning, CAD, heart failure with midrange ejection fraction 45% who presented to the hospital as a code stroke due to sudden onset expressive aphasia.   History obtained from patient, family and chart review.  Patient had a syncopal episode 6 days prior to his presentation and fell and hit the floor with bruising on his head around his right eye.  He is not able to recall what exactly happened.   On presentation to the hospital, he was noted to have significant expressive aphasia.  Code stroke and sent for head CT with findings concerning for acute left hemisphere hemorrhage with subarachnoid and intra-axial component.   Neurosurgery was consulted and they recommended repeat head CT in 6 hours.   Significant Events 12/12 head CT with acute L hemisphere hemorrhage with suspected subarachnoid and intraaxial components with regional hypodense edema Admitted to the ICU  12/26/23 12/13 unchanged L sided subarachnoid hemorrhage, subdural hemorrhage, and suspected L temporal intraparenchymal hemorrhage and edema 12/13 LTM EEG - seizure noted 12/14 neurology signed off 12/14 last neurosurgery note 12/15 orthostatic positive 12/16 [facility] assumed care    Post ICU care complicated by orthostatic hypotension limiting his ability to participate therapy.  We've started him on compression stockings, abdominal binder.  Started midodrine .     Discharge pending improvement in orthostasis.      Assessment/Plan:   Traumatic L Temporal ICH Traumatic SAH  Subdural Hemorrhage Expressive Aphasia  Seizures Head CT at presentation 12/12 with acute L hemisphere hemorrhage with suspected subarachnoid and intraaxial components with regional hypodense  edema CTA head/neck 12/12 negative for intracranial aneurysm, LVO, or CTA spot sign in association with acute intracranial hemorrhage - atherosclerosis including moderate to severe stenosis of the L ACA A1 segment, up to moderate bilateral supraclinoid ICA stenosis - previous L carotid endarterectomy CT 12/13 unchanged L sided subarachnoid hemorrhage, subdural hemorrhage and suspected L temporal intraparenchymal hemorrhage and edema Repeat head CT 12/19  with decreased SAH in L>R cerebral hemispheres, decreased hemorrhage in L temporal lobe with decreased edema in this region, unchanged subdural hematoma over L cerebral convexity, decreased small volume IVH in lateral ventricles MRI with subarachnoid hemorrhage involving the L greater than R cerebral hemispheres, subdural hematoma overlying the L cerebral convexity measuring up to 4 mm in maximal thickness without significant mass effect or  midline shift (12/20) we obtained this due to some word finding difficulty and transient encephalopathy - suspect this is due to hypotension while working with therapy in setting of his SAH.   Neurosurgery saw last on 12/14 Neurology signed off 12/14 - recommending keppra  1 g BID for seizures in setting of traumatic ICH, SDH, SAH, hypertensive emergency, coagulopathy.  Seizure precautions.  Neurology follow up in 8-12 weeks.    Per [place]  DMV statutes, patients with seizures are not allowed to drive until  they have been seizure-free for six months. Use caution when using heavy equipment or power tools. Avoid working on ladders or at heights. Take showers instead of baths. Ensure the water temperature is not too high on the home water heater. Do not go swimming alone. When caring for infants or small children, sit down when holding, feeding, or changing them to minimize risk of injury to the child in the event you  have a seizure. Also, Maintain good sleep hygiene. Avoid alcohol.  Positive FOBT -Had black tarry  stool last night -Stool for FOBT is positive - hemoglobin stable at 12.6 -Called and discussed with GI on-call Dr. Nancy Mcgreal, she recommends to continue monitoring hemoglobin. -Might need GI evaluation as outpatient -If hemoglobin drops continues to have black stool, will need intervention during this hospitalization - Follow CBC in a.m.    Orthostatic Hypotension Continue compression stockings, abdominal binder when OOB Metop low dose resumed due to RVR 12/18 Started on midodrine  2.5 mg 3 times daily; changed to midodrine  to 2.5 mg p.o. twice daily -Will try to wean off midodrine  over next few days Follow cortisol 12.2 (unlikely adrenal insufficiency -ACTH  stimulation test is normal, TSH (wnl) - Blood pressure improves post mobility with therapy - Still having episodes of orthostatic hypotension    Atrial Fibrillation with RVR Eliquis  has been discontinued in the setting of above (per nsgy, if indicated, can resume in 2 weeks) Rates improved with IV metop, follow with oral metop  -Continue Toprol -XL 12.5 mg daily   HFmrEF Diastolic Dysfunction S/p mTEER procedure  Moderate Mitral Regurgitation Currently appears euvolemic  Caution with IVF   Hypothyroidism Synthroid , cytomel    BPH Overactive Bladder Proscar  Not taking gemtesa   Depression  Anxiety Continue paxil , ativan   Will discontinue wellbutrin  at discharge due to his seizure above   Dyslipidemia Statin, zetia  on hold    Disposition -Patient would likely go home with home health OT and PT on Monday   DVT prophylaxis: SCDs  Medications     docusate sodium   100 mg Oral BID   finasteride   5 mg Oral Daily   levETIRAcetam   1,000 mg Oral BID   levothyroxine   75 mcg Oral Daily   liothyronine   10 mcg Oral Daily   metoprolol  succinate  12.5 mg Oral Daily   midodrine   2.5 mg Oral BID WC   pantoprazole   40 mg Oral Daily   PARoxetine   10 mg Oral Daily   polyethylene glycol  17 g Oral BID    Prednisolon-Moxiflox-Bromfenac   1 drop Left Eye TID     Data Reviewed:   CBG:  Recent Labs  Lab 01/03/24 2213 01/03/24 2349 01/04/24 0345 01/04/24 0407 01/04/24 0844  GLUCAP 102* 105* 107* 105* 120*    SpO2: 96 %    Vitals:   01/09/24 1954 01/09/24 2353 01/10/24 0310 01/10/24 0801  BP:  136/74 (!) 151/71 131/78  Pulse:  68 73 78  Resp:  19 19 18   Temp: 97.6 F (36.4 C) 97.8 F (36.6 C) 98 F (36.7 C) 97.7 F (36.5 C)  TempSrc: Oral Oral Oral Oral  SpO2:  96% 93% 96%  Weight:      Height:          Data Reviewed:  Basic Metabolic Panel: Recent Labs  Lab 01/04/24 0552  NA 134*  K 3.8  CL 101  CO2 26  GLUCOSE 99  BUN 14  CREATININE 0.67  CALCIUM  8.7*  MG 1.8  PHOS 3.2    CBC: Recent Labs  Lab 01/04/24 0552 01/07/24 0256 01/07/24 0604 01/08/24 0641 01/10/24 0215  WBC 9.7 11.2* 10.2 10.3 10.3  NEUTROABS 6.2  --   --   --   --   HGB 12.5* 12.3* 12.5* 12.6* 12.4*  HCT 37.1* 37.1* 37.4* 38.0* 37.9*  MCV 85.9 86.9 86.8 86.2 86.9  PLT 199 224 210 226 200    LFT Recent Labs  Lab 01/04/24  0552  AST 19  ALT 26  ALKPHOS 79  BILITOT 0.6  PROT 5.6*  ALBUMIN  3.4*     Antibiotics: Anti-infectives (From admission, onward)    None        CONSULTS   Code Status: Full code  Family Communication: Called and discussed with patient's wife at bedside     Subjective   Denies dizziness.  Still has intermittent episodes of orthostatic hypotension.  Blood pressure has significantly improved.   Objective    Physical Examination:  General-appears in no acute distress Heart-S1-S2, regular, no murmur auscultated Lungs-clear to auscultation bilaterally, no wheezing or crackles auscultated Abdomen-soft, nontender, no organomegaly Extremities-no edema in the lower extremities Neuro-alert, oriented x3, no focal deficit noted           Raymond Christensen S Raymond Christensen   [facility] If 7PM-7AM, please contact night-coverage at  [email or web address], Office  [phone]   01/10/2024, 10:46 AM  LOS: 15 days

## 2024-01-11 DIAGNOSIS — I609 Nontraumatic subarachnoid hemorrhage, unspecified: Secondary | ICD-10-CM | POA: Diagnosis not present

## 2024-01-11 NOTE — Progress Notes (Signed)
 Physical Therapy Treatment Patient Details Name: Raymond Christensen MRN: [id number] DOB: Dec 09, 1940 Today's Date: 01/11/2024   History of Present Illness 83 y.o. M presented to the hospital as a code stroke 12/12 due to sudden onset expressive aphasia. Found to have acute left hemisphere hemorrhage with subarachnoid and intra-axial component. Did have a fall 6 days PTA. Repeat imaging 12/19: MRI Brain (-) for infarct and no other acute intracranial abnormality; CT Head showed decreased SAH involving L>R cerebral hemispheres, decreased hemorrhage in left temporal lobe with decreased edema in this region, unchanged subdural hematoma over left cerebral convexity, and decreased small volume intraventricular hemorrhage in the lateral ventricles. PHMx: paroxysmal A-fib,  CAD, heart failure with midrange ejection fraction 45%, HTN, DM, anxiety, NSTEMI, memory change, OA, skin CA left arm, vaso vagal responses to GI issues.    PT Comments  Pt admitted with above diagnosis. Pt progressing with ambulation each visit only needing CGA and increasing distance.  Did some exercises to strengthen LEs. BPS stable and HR stable today.  Pt without LOB with use of rollator.  Pt met 2/5 goals and goals revised.  HAs made progress and pt will have supervision at home.  Can continue to progress to Modif I with HHPT. Pt currently with functional limitations due to the deficits listed below (see PT Problem List). Pt will benefit from acute skilled PT to increase their independence and safety with mobility to allow discharge.       If plan is discharge home, recommend the following: A little help with walking and/or transfers;A little help with bathing/dressing/bathroom;Assistance with cooking/housework;Assist for transportation;Direct supervision/assist for financial management;Help with stairs or ramp for entrance   Can travel by private vehicle        Equipment Recommendations  Rollator (4 wheels)    Recommendations for  Other Services       Precautions / Restrictions Precautions Precautions: Fall Recall of Precautions/Restrictions: Intact Precaution/Restrictions Comments: orthostatic; TED hose and abdominal binder Restrictions Weight Bearing Restrictions Per Provider Order: No     Mobility  Bed Mobility Overal bed mobility: Needs Assistance Bed Mobility: Supine to Sit     Supine to sit: HOB elevated, Used rails, Modified independent (Device/Increase time)     General bed mobility comments: increased time    Transfers Overall transfer level: Needs assistance Equipment used: Rollator (4 wheels) Transfers: Sit to/from Stand Sit to Stand: Supervision           General transfer comment: cues to lock rollator brakes.  AT end of session, practiced sit to stand x 10 with CGA.  Pt also performed semi-squats x 10.  Pt reported fatigue after these exercises.    Ambulation/Gait Ambulation/Gait assistance: Supervision Gait Distance (Feet): 300 Feet Assistive device: Rollator (4 wheels) Gait Pattern/deviations: Step-through pattern, Decreased stride length Gait velocity: slightly decreased     General Gait Details: nurse stated he had to order new TED hose and they werent in room yet.  Placed abdominal binder and took BPs without dizziness and orthostasis.  Pt able to ambulate with RW in halls without  reports of dizziness with slow, but steady gait even with min challenges to balance. End of walk BP 123/60, 84 bpm. Pt left in chair.   Stairs             Wheelchair Mobility     Tilt Bed    Modified Rankin (Stroke Patients Only) Modified Rankin (Stroke Patients Only) Pre-Morbid Rankin Score: No symptoms Modified Rankin: Moderately severe disability  Balance Overall balance assessment: Needs assistance Sitting-balance support: No upper extremity supported, Feet supported Sitting balance-Leahy Scale: Good Sitting balance - Comments: sitting unsupported from backrest of  chair   Standing balance support: Bilateral upper extremity supported, During functional activity Standing balance-Leahy Scale: Fair Standing balance comment: with rollator support                            Communication Communication Communication: No apparent difficulties Factors Affecting Communication: Difficulty expressing self  Cognition Arousal: Alert Behavior During Therapy: WFL for tasks assessed/performed   PT - Cognitive impairments: Problem solving, Memory, Attention, Awareness, Safety/Judgement                       PT - Cognition Comments: likes to joke Following commands: Intact Following commands impaired: Follows multi-step commands with increased time    Cueing Cueing Techniques: Verbal cues  Exercises General Exercises - Lower Extremity Mini-Sqauts: AROM, Both, 10 reps, Standing Other Exercises Other Exercises: Pt performed 10 sit to stands    General Comments General comments (skin integrity, edema, etc.): VSS      Pertinent Vitals/Pain Pain Assessment Pain Assessment: No/denies pain    Home Living                          Prior Function            PT Goals (current goals can now be found in the care plan section) Acute Rehab PT Goals Patient Stated Goal: to return home PT Goal Formulation: With patient Time For Goal Achievement: 01/25/24 Progress towards PT goals: Progressing toward goals    Frequency    Min 2X/week      PT Plan      Co-evaluation              AM-PAC PT 6 Clicks Mobility   Outcome Measure  Help needed turning from your back to your side while in a flat bed without using bedrails?: A Little Help needed moving from lying on your back to sitting on the side of a flat bed without using bedrails?: A Little Help needed moving to and from a bed to a chair (including a wheelchair)?: A Little Help needed standing up from a chair using your arms (e.g., wheelchair or bedside chair)?:  A Little Help needed to walk in hospital room?: A Little Help needed climbing 3-5 steps with a railing? : A Little 6 Click Score: 18    End of Session Equipment Utilized During Treatment: Gait belt Activity Tolerance: Patient tolerated treatment well Patient left: with call bell/phone within reach;in chair;with chair alarm set Nurse Communication: Mobility status PT Visit Diagnosis: Difficulty in walking, not elsewhere classified (R26.2);Other abnormalities of gait and mobility (R26.89);Unsteadiness on feet (R26.81);Muscle weakness (generalized) (M62.81)     Time: 8495-8469 PT Time Calculation (min) (ACUTE ONLY): 26 min  Charges:    $Gait Training: 8-22 mins $Therapeutic Exercise: 8-22 mins PT General Charges $$ ACUTE PT VISIT: 1 Visit                     Rechelle Niebla M,PT Acute Rehab Services [phone]    Stephane JULIANNA Bevel 01/11/2024, 5:03 PM

## 2024-01-11 NOTE — Progress Notes (Signed)
 " PROGRESS NOTE    Raymond Christensen  FMW:[id number] DOB: Jun 10, 1940 DOA: 12/26/2023 PCP: Frann Mabel Mt, DO  Chief Complaint  Patient presents with   Code Stroke    Brief Narrative:   83 year old male with past medical history of paroxysmal [place] Polson-fib on Eliquis  last dose on day of presentation in the morning, CAD, heart failure with midrange ejection fraction 45% who presented to the hospital as Derl Abalos code stroke due to sudden onset expressive aphasia.   History obtained from patient, family and chart review.  Patient had [place] syncopal episode 6 days prior to his presentation and fell and hit the floor with bruising on his head around his right eye.  He is not able to recall what exactly happened.   On presentation to the hospital, he was noted to have significant expressive aphasia.  Code stroke and sent for head CT with findings concerning for acute left hemisphere hemorrhage with subarachnoid and intra-axial component.   Neurosurgery was consulted and they recommended repeat head CT in 6 hours.   Significant Events 12/12 head CT with acute L hemisphere hemorrhage with suspected subarachnoid and intraaxial components with regional hypodense edema Admitted to the ICU  12/26/23 12/13 unchanged L sided subarachnoid hemorrhage, subdural hemorrhage, and suspected L temporal intraparenchymal hemorrhage and edema 12/13 LTM EEG - seizure noted 12/14 neurology signed off 12/14 last neurosurgery note 12/15 orthostatic positive 12/16 [facility] assumed care    Post ICU care complicated by orthostatic hypotension limiting his ability to participate therapy.  We've started him on compression stockings, abdominal binder.  Started midodrine .     Discharge pending improvement in orthostasis.  Assessment & Plan:   Principal Problem:   Subarachnoid hemorrhage (HCC) Active Problems:   Expressive aphasia   Seizure (HCC)  Traumatic L Temporal ICH Traumatic SAH  Subdural Hemorrhage Expressive Aphasia   Seizures Head CT at presentation 12/12 with acute L hemisphere hemorrhage with suspected subarachnoid and intraaxial components with regional hypodense edema CTA head/neck 12/12 negative for intracranial aneurysm, LVO, or CTA spot sign in association with acute intracranial hemorrhage - atherosclerosis including moderate to severe stenosis of the L ACA A1 segment, up to moderate bilateral supraclinoid ICA stenosis - previous L carotid endarterectomy CT 12/13 unchanged L sided subarachnoid hemorrhage, subdural hemorrhage and suspected L temporal intraparenchymal hemorrhage and edema Repeat head CT 12/19  with decreased SAH in L>R cerebral hemispheres, decreased hemorrhage in L temporal lobe with decreased edema in this region, unchanged subdural hematoma over L cerebral convexity, decreased small volume IVH in lateral ventricles MRI with subarachnoid hemorrhage involving the L greater than R cerebral hemispheres, subdural hematoma overlying the L cerebral convexity measuring up to 4 mm in maximal thickness without significant mass effect or  midline shift (12/20) we obtained this due to some word finding difficulty and transient encephalopathy - suspect this is due to hypotension while working with therapy in setting of his SAH.   Neurosurgery saw last on 12/14 Neurology signed off 12/14 - recommending keppra  1 g BID for seizures in setting of traumatic ICH, SDH, SAH, hypertensive emergency, coagulopathy.  Seizure precautions.  Neurology follow up in 8-12 weeks.    Per [place]  DMV statutes, patients with seizures are not allowed to drive until  they have been seizure-free for six months. Use caution when using heavy equipment or power tools. Avoid working on ladders or at heights. Take showers instead of baths. Ensure the water temperature is not too high on the home water heater. Do not go  swimming alone. When caring for infants or small children, sit down when holding, feeding, or changing them to  minimize risk of injury to the child in the event you have Pansie Guggisberg seizure. Also, Maintain good sleep hygiene. Avoid alcohol.   Melena 12/24 Positive FOBT Prior hospitalist discussed with Dr. Suzann who recommended monitoring Consider formal GI consult if recurrent or hbg drops  Orthostatic Hypotension Continue compression stockings, abdominal binder when OOB Metop low dose resumed due to RVR 12/18 Currently on low dose midodrine  Acth  stim wnl, TSH (wnl) Echo with EF 55-60%, no RWMA, s/p mitral clip Positive orthostatics, but able to work with therapy without issue - improved overall   Atrial Fibrillation with RVR Eliquis  has been discontinued in the setting of above (per nsgy, if indicated, can resume in 2 weeks) Rates improved with IV metop, follow with oral metop    HFmrEF Diastolic Dysfunction S/p mTEER procedure  Moderate Mitral Regurgitation Currently appears euvolemic  Caution with IVF   Hypothyroidism Synthroid , cytomel    BPH Overactive Bladder Proscar  Not taking gemtesa   Depression  Anxiety Continue paxil , ativan   Will discontinue wellbutrin  at discharge due to his seizure above   Dyslipidemia Statin, zetia  on hold     DVT prophylaxis: SCD Code Status: full Family Communication: wife at bedside, son (over phone) Disposition:   Status is: Inpatient Remains inpatient appropriate because: need for continued inpt care - safe discharge planning   Consultants:  PM&R neurosurgery  Procedures:   Echo IMPRESSIONS     1. Left ventricular ejection fraction, by estimation, is 55 to 60%. The  left ventricle has normal function. The left ventricle has no regional  wall motion abnormalities. There is mild concentric left ventricular  hypertrophy. Left ventricular diastolic  function could not be evaluated.   2. Right ventricular systolic function is normal. The right ventricular  size is normal.   3. Status post mitral clip . The mitral valve has been  repaired/replaced.  Mild mitral valve regurgitation. No evidence of mitral stenosis.   4. The aortic valve is calcified. Aortic valve regurgitation is not  visualized. Aortic valve sclerosis/calcification is present, without any  evidence of aortic stenosis.   5. The inferior vena cava is normal in size with greater than 50%  respiratory variability, suggesting right atrial pressure of 3 mmHg.   Comparison(s): The left ventricular function has improved.   Antimicrobials:  Anti-infectives (From admission, onward)    None       Subjective: No complaints  Objective: Vitals:   01/11/24 0523 01/11/24 0903 01/11/24 1333 01/11/24 1618  BP: (!) 142/71 137/70 111/71 123/60  Pulse: 76 70 77 70  Resp: 16 18 16 16   Temp: 97.7 F (36.5 C) 97.7 F (36.5 C) (!) 97.5 F (36.4 C) (!) 97.4 F (36.3 C)  TempSrc: Oral Oral Oral Oral  SpO2: 95% 97% 100% 99%  Weight:      Height:        Intake/Output Summary (Last 24 hours) at 01/11/2024 1900 Last data filed at 01/11/2024 9475 Gross per 24 hour  Intake --  Output 600 ml  Net -600 ml   Filed Weights   01/05/24 1500  Weight: 90.1 kg    Examination:  General exam: Appears calm and comfortable  Respiratory system: unlabored Cardiovascular system: RRR Gastrointestinal system: Abdomen is nondistended, soft and nontender. Central nervous system: Alert and oriented.  Slowed speech at times.  Symmetric strength.  Intact FNF.   Extremities: no LEE   Data Reviewed:  I have personally reviewed following labs and imaging studies  CBC: Recent Labs  Lab 01/07/24 0256 01/07/24 0604 01/08/24 0641 01/10/24 0215  WBC 11.2* 10.2 10.3 10.3  HGB 12.3* 12.5* 12.6* 12.4*  HCT 37.1* 37.4* 38.0* 37.9*  MCV 86.9 86.8 86.2 86.9  PLT 224 210 226 200    Basic Metabolic Panel: No results for input(s): NA, K, CL, CO2, GLUCOSE, BUN, CREATININE, CALCIUM , MG, PHOS in the last 168 hours.  GFR: Estimated Creatinine Clearance:  77.6 mL/min (by C-G formula based on SCr of 0.67 mg/dL).  Liver Function Tests: No results for input(s): AST, ALT, ALKPHOS, BILITOT, PROT, ALBUMIN  in the last 168 hours.  CBG: No results for input(s): GLUCAP in the last 168 hours.   No results found for this or any previous visit (from the past 240 hours).       Radiology Studies: No results found.      Scheduled Meds:  docusate sodium   100 mg Oral BID   finasteride   5 mg Oral Daily   levETIRAcetam   1,000 mg Oral BID   levothyroxine   75 mcg Oral Daily   liothyronine   10 mcg Oral Daily   metoprolol  succinate  12.5 mg Oral Daily   midodrine   2.5 mg Oral BID WC   pantoprazole   40 mg Oral Daily   PARoxetine   10 mg Oral Daily   polyethylene glycol  17 g Oral BID   Prednisolon-Moxiflox-Bromfenac   1 drop Left Eye TID   Continuous Infusions:   LOS: 16 days    Time spent: over 30 min     Meliton Monte, MD [facility]   To contact the attending provider between 7A-7P or the covering provider during after hours 7P-7A, please log into the web site [email or web address] and access using universal [place] password for that web site. If you do not have the password, please call the hospital operator.  01/11/2024, 7:00 PM    "

## 2024-01-12 ENCOUNTER — Other Ambulatory Visit (HOSPITAL_COMMUNITY): Payer: Self-pay

## 2024-01-12 MED ORDER — POLYETHYLENE GLYCOL 3350 17 GM/SCOOP PO POWD
17.0000 g | Freq: Every day | ORAL | 0 refills | Status: AC
  Filled 2024-01-12: qty 238, 14d supply, fill #0

## 2024-01-12 MED ORDER — MIDODRINE HCL 2.5 MG PO TABS
2.5000 mg | ORAL_TABLET | Freq: Two times a day (BID) | ORAL | 0 refills | Status: DC
  Filled 2024-01-12: qty 60, 30d supply, fill #0

## 2024-01-12 MED ORDER — LEVETIRACETAM 1000 MG PO TABS
1000.0000 mg | ORAL_TABLET | Freq: Two times a day (BID) | ORAL | 0 refills | Status: AC
  Filled 2024-01-12: qty 120, 60d supply, fill #0

## 2024-01-12 NOTE — TOC Transition Note (Signed)
 Transition of Care [facility]) - Discharge Note   Patient Details  Name: Raymond Christensen MRN: [id number] Date of Birth: 1940-06-30  Transition [facility] Care [facility] CM/SW Contact:  Almarie CHRISTELLA Goodie, LCSW Phone Number: 01/12/2024, 10:07 AM   Clinical Narrative:   CSW updated by Alfonso that patient's home health, DME, caregiver support, and transportation are all arranged for discharge back to [facility] today. Transport fleeta will arrive around 11 am, spouse will arrive with the fleeta to assist with patient's discharge. Patient will have 24/7 aide support upon return home. Therapies already arranged with [facility] to fill prescriptions. No further inpatient care management needs at this time.    Final next level of care: Home w Home Health Services Barriers to Discharge: Barriers Resolved   Patient Goals and CMS Choice   CMS Medicare.gov Compare Post Acute Care list provided to:: Patient Choice offered to / list presented to : Patient      Discharge Placement              Patient chooses bed at: [facility] Patient to be transferred to facility by: [facility] Name of family member notified: Alfonso Patient and family notified of of transfer: 01/12/24  Discharge Plan and Services Additional resources added to the After Visit Summary for     Discharge Planning Services: CM Consult Post Acute Care Choice: Home Health, Durable Medical Equipment          DME Arranged: Walker rolling with seat DME Agency: [employer] Date DME Agency Contacted: 01/01/24   Representative spoke with at DME Agency: [place] HH Arranged: PT, OT HH Agency:  [employer]) Date HH Agency Contacted: 01/01/24   Representative spoke with at [facility] Agency: [place]  Social Drivers of Health (SDOH) Interventions SDOH Screenings   Food Insecurity: No Food Insecurity (12/31/2023)  Housing: Low Risk (12/31/2023)  Transportation Needs: No Transportation Needs (12/31/2023)  Utilities: Not  At Risk (12/31/2023)  Alcohol Screen: Low Risk (07/31/2022)  Depression (PHQ2-9): Low Risk (12/22/2023)  Financial Resource Strain: Low Risk (07/31/2022)  Physical Activity: Inactive (07/31/2022)  Social Connections: Unknown (12/31/2023)  Stress: No Stress Concern Present (07/31/2022)  Tobacco Use: Medium Risk (12/26/2023)  Health Literacy: Adequate Health Literacy (07/31/2022)     Readmission Risk Interventions     No data to display

## 2024-01-12 NOTE — Discharge Summary (Signed)
 Physician Discharge Summary  Cecil Vandyke FMW:[id number] DOB: 11-03-1940 DOA: 12/26/2023  PCP: Frann Mabel Mt, DO  Admit date: 12/26/2023 Discharge date: 01/12/2024  Time spent: 40 minutes  Recommendations for Outpatient Follow-up:  Follow outpatient CBC/CMP  Follow with neurosurgery outpatient  Follow with neurology outpatient for seizures, family with questions about tapering keppra  long term Follow orthostasis outpatient - improved with compression stockings/abdominal binder when upright + midodrine  - adjust midodrine  as needed Follow with GI outpatient for ?melena noted here  Follow with cardiology outpatient for atrial fibrillation Continue outpatient PT/OT/SLP   Discharge Diagnoses:  Principal Problem:   Subarachnoid hemorrhage (HCC) Active Problems:   Expressive aphasia   Seizure (HCC)   Discharge Condition: stable  Diet recommendation: heart healthy  Filed Weights   01/05/24 1500  Weight: 90.1 kg    History of present illness:   83 year old male with past medical history of paroxysmal Alexi Dorminey-fib on Eliquis  last dose on day of presentation in the morning, CAD, heart failure with midrange ejection fraction 45% who presented to the hospital as Athanasia Stanwood code stroke due to sudden onset expressive aphasia.   History was obtained from patient, family and chart review.  Mr. Carton had Zakiah Gauthreaux syncopal episode 6 days prior to his presentation and fell and hit the floor with bruising on his head around his right eye.  He was not able to recall what exactly happened.   On presentation to the hospital, he was noted to have significant expressive aphasia.  Code stroke and sent for head CT with findings concerning for acute left hemisphere hemorrhage with subarachnoid and intra-axial component.   Neurosurgery was consulted and they recommended repeat head CT in 6 hours.   Significant Events 12/12 head CT with acute L hemisphere hemorrhage with suspected subarachnoid and intraaxial  components with regional hypodense edema Admitted to the ICU  12/26/23 12/13 unchanged L sided subarachnoid hemorrhage, subdural hemorrhage, and suspected L temporal intraparenchymal hemorrhage and edema 12/13 LTM EEG - seizure noted 12/14 neurology signed off 12/14 last neurosurgery note 12/15 orthostatic positive 12/16 TRH assumed care    Post ICU care complicated by orthostatic hypotension limiting his ability to participate therapy.  We've started him on compression stockings, abdominal binder, and midodrine .  His orthostasis has improved, he was able to  walk 300 feet with therapy yesterday without dizziness or orthostasis.    Discharging today with 24/7 aide, outpatient therapy.      Discharge pending improvement in orthostasis.  Hospital Course:  Assessment and Plan:  Traumatic L Temporal ICH Traumatic SAH  Subdural Hemorrhage Expressive Aphasia  Seizures Head CT at presentation 12/12 with acute L hemisphere hemorrhage with suspected subarachnoid and intraaxial components with regional hypodense edema CTA head/neck 12/12 negative for intracranial aneurysm, LVO, or CTA spot sign in association with acute intracranial hemorrhage - atherosclerosis including moderate to severe stenosis of the L ACA A1 segment, up to moderate bilateral supraclinoid ICA stenosis - previous L carotid endarterectomy CT 12/13 unchanged L sided subarachnoid hemorrhage, subdural hemorrhage and suspected L temporal intraparenchymal hemorrhage and edema Repeat head CT 12/19  with decreased SAH in L>R cerebral hemispheres, decreased hemorrhage in L temporal lobe with decreased edema in this region, unchanged subdural hematoma over L cerebral convexity, decreased small volume IVH in lateral ventricles MRI with subarachnoid hemorrhage involving the L greater than R cerebral hemispheres, subdural hematoma overlying the L cerebral convexity measuring up to 4 mm in maximal thickness without significant mass effect or   midline shift (12/20) we obtained  this due to some word finding difficulty and transient encephalopathy - suspect this is due to hypotension while working with therapy in setting of his SAH.   Neurosurgery saw last on 12/14 Neurology signed off 12/14 - recommending keppra  1 g BID for seizures in setting of traumatic ICH, SDH, SAH, hypertensive emergency, coagulopathy.  Seizure precautions.  Neurology follow up in 8-12 weeks.    Per [place]  DMV statutes, patients with seizures are not allowed to drive until  they have been seizure-free for six months. Use caution when using heavy equipment or power tools. Avoid working on ladders or at heights. Take showers instead of baths. Ensure the water temperature is not too high on the home water heater. Do not go swimming alone. When caring for infants or small children, sit down when holding, feeding, or changing them to minimize risk of injury to the child in the event you have Aunya Lemler seizure. Also, Maintain good sleep hygiene. Avoid alcohol.   Concern for Melena 12/24 Positive FOBT Prior hospitalist discussed with Dr. Suzann who recommended monitoring Follow with GI outpatient    Orthostatic Hypotension Continue compression stockings, abdominal binder when OOB Metop low dose resumed due to RVR 12/18 Currently on low dose midodrine  Acth  stim wnl, TSH (wnl) Echo with EF 55-60%, no RWMA, s/p mitral clip Positive orthostatics, but able to work with therapy without issue - improved overall   Atrial Fibrillation with RVR Eliquis  has been discontinued in the setting of above (per nsgy, Dr. Lanis, if indicated, can resume in 2 weeks) Will continue to hold anticoagulation at discharge as risks of bleeding continue to outweigh benefits of resumption.  Follow outpatient. Continue oral metop   HFmrEF Diastolic Dysfunction S/p mTEER procedure  Moderate Mitral Regurgitation Currently appears euvolemic    Hypothyroidism Synthroid , cytomel     BPH Overactive Bladder Proscar  Not taking gemtesa, discontinued at discharge   Depression  Anxiety Continue paxil , ativan   Will discontinue wellbutrin  at discharge due to his seizure above   Dyslipidemia Statin, zetia       Procedures: Echo IMPRESSIONS     1. Left ventricular ejection fraction, by estimation, is 55 to 60%. The  left ventricle has normal function. The left ventricle has no regional  wall motion abnormalities. There is mild concentric left ventricular  hypertrophy. Left ventricular diastolic  function could not be evaluated.   2. Right ventricular systolic function is normal. The right ventricular  size is normal.   3. Status post mitral clip . The mitral valve has been repaired/replaced.  Mild mitral valve regurgitation. No evidence of mitral stenosis.   4. The aortic valve is calcified. Aortic valve regurgitation is not  visualized. Aortic valve sclerosis/calcification is present, without any  evidence of aortic stenosis.   5. The inferior vena cava is normal in size with greater than 50%  respiratory variability, suggesting right atrial pressure of 3 mmHg.   Comparison(s): The left ventricular function has improved.   Consultations: Neurology Neurosurgery PM&R  Discharge Exam: Vitals:   01/12/24 0348 01/12/24 0804  BP: (!) 140/81 (!) 141/82  Pulse: 71 70  Resp: 16 16  Temp: 98 F (36.7 C) 97.6 F (36.4 C)  SpO2: 98% 97%   No complaints Stomach is Tayna Smethurst little upset from meds He's comfortable discharging today Discussed with his wife as well and his son.  They had questions about potentially tapering keppra  earlier (based on comments from an uncle - concerns about fatigue, irritability, etc).  Noted neurology's earlier recommendation for 8-12  week follow up and referral placed on discharge (can call and ask for earlier appt if desired, but will depend on availability).  Fatigue and irritability may improve with discharge given difficult nature of  Coral Timme hospitalization, would watch those symptoms.  Lots of appropriate concerns from family, but seemed reassured after discussion.  Offered to answer additional questions as needed, recommended reaching out to RN if questions prior to discharge.  General: No acute distress. Cardiovascular: RRR Lungs: unlabored Abdomen: Soft, nontender, nondistended Neurological: Alert and oriented 3. Moves all extremities 4. Cranial nerves II through XII grossly intact. Extremities: No clubbing or cyanosis. No edema.  Discharge Instructions   Discharge Instructions     Ambulatory referral to Gastroenterology   Complete by: As directed    What is the reason for referral?: Other Comment - black stool   Ambulatory referral to Neurology   Complete by: As directed    An appointment is requested in approximately: 4 weeks   Ambulatory referral to Occupational Therapy   Complete by: As directed    Ambulatory referral to Physical Therapy   Complete by: As directed    Ambulatory referral to Speech Therapy   Complete by: As directed    Call MD for:  difficulty breathing, headache or visual disturbances   Complete by: As directed    Call MD for:  extreme fatigue   Complete by: As directed    Call MD for:  hives   Complete by: As directed    Call MD for:  persistant dizziness or light-headedness   Complete by: As directed    Call MD for:  persistant nausea and vomiting   Complete by: As directed    Call MD for:  redness, tenderness, or signs of infection (pain, swelling, redness, odor or green/yellow discharge around incision site)   Complete by: As directed    Call MD for:  severe uncontrolled pain   Complete by: As directed    Call MD for:  temperature >100.4   Complete by: As directed    Discharge instructions   Complete by: As directed    You've had Rainn Bullinger complicated hospitalization as the result of Vonnie Ligman brain bleed in the setting of Renn Dirocco syncopal episode (fainting spell) with head trauma.  You were seen  by neurosurgery who recommended conservative care.  Your eliquis  was reversed and has now been discontinued.  You should follow with neurosurgery as an outpatient.  We've included their follow up information.  I've made them aware of your discharge.  You had seizures as Haward Pope result of your brain bleed.  You've been started on keppra  and your wellbutrin  has been discontinued (wellbutrin  lowers the seizure threshold).  You should follow up with neurology as an outpatient.    Per [place]  DMV statutes, patients with seizures are not allowed to drive until  they have been seizure-free for six months. Use caution when using heavy equipment or power tools. Avoid working on ladders or at heights. Take showers instead of baths. Ensure the water temperature is not too high on the home water heater. Do not go swimming alone. When caring for infants or small children, sit down when holding, feeding, or changing them to minimize risk of injury to the child in the event you have Terisa Belardo seizure. Also, Maintain good sleep hygiene. Avoid alcohol.  Your hospitalization was complicated by significant orthostatic hypotension (drop in your blood pressure with standing).  This has improved with compression stockings and an abdominal binder.  Continue to  wear these when up and about.  Change positions slowly.  We also added midodrine  to increase your blood pressure (this may need to be titrated, but the goal long term, should be to wean this off, if possible).   You had an episode of dark tarry stools here in the hospital.  [facility] refer you to gastroenterology as an outpatient.  Return to care if this recurs.   We've stopped your eliquis  for your atrial fibrillation.  I think the risks of bleeding currently outweigh the benefits of resumption of your eliquis .  You can follow up with your cardiologist outpatient regarding your atrial fibrillation.  We'll arrange outpatient physical therapy, occupational therapy, and speech  therapy for you.  Return for new, recurrent, or worsening symptoms.  Please ask your PCP to request records from this hospitalization so they know what was done and what the next steps will be.   Discharge wound care:   Complete by: As directed    Cleanse right forearm wound with normal saline, apply vaseline gauze (Lawson [phone]) to wound bed daily and secure with silicone foam or Kerlix roll gauze whichever is preferred.  Soak dressing with normal saline if adhered to wound bed for atraumatic removal   Increase activity slowly   Complete by: As directed       Allergies as of 01/12/2024       Reactions   Sulfa Antibiotics Rash   Questionable, he developed Kaylan Yates diffuse rash 2 days after stopping Bactrim        Medication List     PAUSE taking these medications    Ozempic  (2 MG/DOSE) 8 MG/3ML Sopn Wait to take this until your doctor or other care provider tells you to start again. Hold this medicine until your cleared to resume by your outpatient provider Generic drug: Semaglutide  (2 MG/DOSE) INJECT SUBCUTANEOUSLY 2 MG EVERY WEEK ON SUNDAY AS DIRECTED What changed: See the new instructions.       STOP taking these medications    buPROPion  150 MG 24 hr tablet Commonly known as: Wellbutrin  XL   Eliquis  5 MG Tabs tablet Generic drug: apixaban    fluorouracil 5 % cream Commonly known as: EFUDEX   furosemide  20 MG tablet Commonly known as: LASIX    Gemtesa 75 MG Tabs Generic drug: Vibegron       TAKE these medications    acetaminophen  500 MG tablet Commonly known as: TYLENOL  Take 1,000 mg by mouth daily.   atorvastatin  80 MG tablet Commonly known as: LIPITOR  TAKE 1 TABLET BY MOUTH DAILY   docusate sodium  100 MG capsule Commonly known as: COLACE Take 100 mg by mouth daily as needed for mild constipation or moderate constipation.   ezetimibe  10 MG tablet Commonly known as: ZETIA  TAKE 1 TABLET BY MOUTH DAILY   famotidine  20 MG tablet Commonly known as:  PEPCID  Take 20 mg by mouth daily.   finasteride  5 MG tablet Commonly known as: PROSCAR  Take 1 tablet (5 mg total) by mouth daily.   levETIRAcetam  1000 MG tablet Commonly known as: KEPPRA  Take 1 tablet (1,000 mg total) by mouth 2 (two) times daily. Follow up with neurology outpatient for refills.   levothyroxine  75 MCG tablet Commonly known as: SYNTHROID  TAKE 1 TABLET BY MOUTH DAILY   liothyronine  5 MCG tablet Commonly known as: CYTOMEL  TAKE 2 TABLETS BY MOUTH DAILY   LORazepam  0.5 MG tablet Commonly known as: Ativan  1 bid What changed:  how much to take how to take this when to take  this additional instructions   metoprolol  succinate 25 MG 24 hr tablet Commonly known as: TOPROL -XL Take 0.5 tablets (12.5 mg total) by mouth daily.   midodrine  2.5 MG tablet Commonly known as: PROAMATINE  Take 1 tablet (2.5 mg total) by mouth 2 (two) times daily with Idrissa Beville meal. This should be adjusted by your outpatient doctor as appropriate based on your orthostatic hypotension.   MULTIVITAMIN ADULT EXTRA C PO Take 1 tablet by mouth daily.   nitroGLYCERIN  0.4 MG SL tablet Commonly known as: NITROSTAT  Place 1 tablet (0.4 mg total) under the tongue every 5 (five) minutes as needed for chest pain.   PARoxetine  10 MG tablet Commonly known as: PAXIL  TAKE 1 TABLET BY MOUTH DAILY   polyethylene glycol 17 g packet Commonly known as: MIRALAX  / GLYCOLAX  Take 17 g by mouth daily. Titrate as needed for Salena Ortlieb soft bowel movement daily   UNABLE TO FIND Place into the left eye in the morning, at noon, in the evening, and at bedtime. Prednisolone  Phosphate 1% /  Moxifloxacin  0.5% / Bromfenac  0.075% Sterile Ophthalmic Solution   Vitamin D3 250 MCG (10000 UT) Tabs Take 1 tablet by mouth daily.               Durable Medical Equipment  (From admission, onward)           Start     Ordered   01/01/24 1158  For home use only DME 4 wheeled rolling walker with seat  Once       Question:  Patient  needs Osha Rane walker to treat with the following condition  Answer:  Weakness   01/01/24 1157              Discharge Care Instructions  (From admission, onward)           Start     Ordered   01/12/24 0000  Discharge wound care:       Comments: Cleanse right forearm wound with normal saline, apply vaseline gauze ([facility]son #[id number]) to wound bed daily and secure with silicone foam or Kerlix roll gauze whichever is preferred.  Soak dressing with normal saline if adhered to wound bed for atraumatic removal   01/12/24 0954           Allergies[1]  Follow-up Information     Frann Mabel Mt, DO Follow up in 2 week(s).   Specialty: Family Medicine Contact information: [address] [place] [address] [phone]         Lanis Pupa, MD Follow up.   Specialty: Neurosurgery Why: call for Shrinika Blatz follow up appointment Contact information: [address] [place] [address] [phone]         [facility] Follow up.   Why: you've been referred for an appointment for your seizures, if you don't get Charley Lafrance phone call within 1-2 weeks, please call for an appointment. Contact information: [address], [address] [place]  [address] [phone]        [facility] Follow up.   Specialty: Gastroenterology Why: follow up with regards to the blood in your stool Contact information: [address] [place]  [address] [phone]                 The results of significant diagnostics from this hospitalization (including imaging, microbiology, ancillary and laboratory) are listed below for reference.    Significant Diagnostic Studies: ECHOCARDIOGRAM COMPLETE Result Date: 01/04/2024  ECHOCARDIOGRAM REPORT   Patient Name:   FAISAL STRADLING Date of Exam: 01/04/2024 Medical Rec #:  [id number]      Height:       68.0 in Accession #:    [id number]      Weight:       198.6 lb Date of Birth:  November 02, 1940      BSA:          2.038 m Patient Age:    83 years       BP:           151/84 mmHg Patient Gender: M              HR:           74 bpm. Exam Location:  Inpatient Procedure: 2D Echo, Cardiac Doppler and Color Doppler (Both Spectral and Color            Flow Doppler were utilized during procedure). Indications:    Other abnormalities of the heart R00.8  History:        Patient has prior history of Echocardiogram examinations, most                 recent 09/16/2023. CAD and Previous Myocardial Infarction; Risk                 Factors:Hypertension, Diabetes, Dyslipidemia and Sleep Apnea.  Sonographer:    Aida Pizza RCS Referring Phys: [phone] Ivor Kishi CALDWELL POWELL JR  Sonographer Comments: Poor subcostal images IMPRESSIONS  1. Left ventricular ejection fraction, by estimation, is 55 to 60%. The left ventricle has normal function. The left ventricle has no regional wall motion abnormalities. There is mild concentric left ventricular hypertrophy. Left ventricular diastolic function could not be evaluated.  2. Right ventricular systolic function is normal. The right ventricular size is normal.  3. Status post mitral clip . The mitral valve has been repaired/replaced. Mild mitral valve regurgitation. No evidence of mitral stenosis.  4. The aortic valve is calcified. Aortic valve regurgitation is not visualized. Aortic valve sclerosis/calcification is present, without any evidence of aortic stenosis.  5. The inferior vena cava is normal in size with greater than 50% respiratory variability, suggesting right atrial pressure of 3 mmHg. Comparison(s): The left ventricular function has improved. FINDINGS  Left Ventricle: Left ventricular ejection fraction, by estimation, is 55 to 60%. The left ventricle has normal function. The left ventricle has no regional wall motion abnormalities. The left ventricular internal cavity size was normal in size. There is  mild concentric left  ventricular hypertrophy. Left ventricular diastolic function could not be evaluated due to mitral valve repair. Left ventricular diastolic function could not be evaluated. Right Ventricle: The right ventricular size is normal. No increase in right ventricular wall thickness. Right ventricular systolic function is normal. Left Atrium: Left atrial size was normal in size. Right Atrium: Right atrial size was normal in size. Pericardium: There is no evidence of pericardial effusion. Mitral Valve: Status post mitral clip. The mitral valve has been repaired/replaced. Mild mitral valve regurgitation. No evidence of mitral valve stenosis. Tricuspid Valve: The tricuspid valve is normal in structure. Tricuspid valve regurgitation is trivial. No evidence of tricuspid stenosis. Aortic Valve: The aortic valve is calcified. Aortic valve regurgitation is not visualized. Aortic valve sclerosis/calcification is present, without any evidence of aortic stenosis. Pulmonic Valve: The pulmonic valve was normal in structure. Pulmonic valve regurgitation is trivial. No evidence of pulmonic stenosis. Aorta: The aortic root is normal in size and structure.  Venous: The inferior vena cava is normal in size with greater than 50% respiratory variability, suggesting right atrial pressure of 3 mmHg. IAS/Shunts: No atrial level shunt detected by color flow Doppler.  LEFT VENTRICLE PLAX 2D LVIDd:         4.80 cm      Diastology LVIDs:         3.30 cm      LV e' medial:    6.96 cm/s LV PW:         1.20 cm      LV E/e' medial:  12.0 LV IVS:        1.10 cm      LV e' lateral:   13.90 cm/s LVOT diam:     1.80 cm      LV E/e' lateral: 6.0 LV SV:         54 LV SV Index:   26 LVOT Area:     2.54 cm  LV Volumes (MOD) LV vol d, MOD A2C: 110.0 ml LV vol d, MOD A4C: 142.0 ml LV vol s, MOD A2C: 49.9 ml LV vol s, MOD A4C: 46.6 ml LV SV MOD A2C:     60.1 ml LV SV MOD A4C:     142.0 ml LV SV MOD BP:      77.2 ml RIGHT VENTRICLE RV S prime:     12.80 cm/s TAPSE  (M-mode): 1.7 cm LEFT ATRIUM             Index        RIGHT ATRIUM           Index LA diam:        4.60 cm 2.26 cm/m   RA Area:     23.40 cm LA Vol (A2C):   66.3 ml 32.54 ml/m  RA Volume:   65.50 ml  32.14 ml/m LA Vol (A4C):   65.9 ml 32.34 ml/m LA Biplane Vol: 67.8 ml 33.27 ml/m  AORTIC VALVE LVOT Vmax:   113.00 cm/s LVOT Vmean:  71.200 cm/s LVOT VTI:    0.212 m  AORTA Ao Root diam: 3.60 cm MITRAL VALVE MV Area (PHT): 3.60 cm    SHUNTS MV Decel Time: 211 msec    Systemic VTI:  0.21 m MV E velocity: 83.80 cm/s  Systemic Diam: 1.80 cm MV Trevionne Advani velocity: 87.90 cm/s MV E/Nayshawn Mesta ratio:  0.95 Kardie Tobb DO Electronically signed by Dub Huntsman DO Signature Date/Time: 01/04/2024/1:19:11 PM    Final    MR BRAIN WO CONTRAST Result Date: 01/02/2024 CLINICAL DATA:  Initial evaluation for acute neuro deficit, stroke suspected. EXAM: MRI HEAD WITHOUT CONTRAST TECHNIQUE: Multiplanar, multiecho pulse sequences of the brain and surrounding structures were obtained without intravenous contrast. COMPARISON:  CT from earlier the same day as well as prior studies. FINDINGS: Brain: Moderately advanced cerebral and cerebellar atrophy. Remote lacunar infarct present at the left basal ganglia. No evidence for acute or subacute infarct. No areas of chronic cortical infarction. Scattered subarachnoid hemorrhage involving the left cerebral hemisphere again seen, stable from prior CT. Involvement most pronounced at the left sylvian fissure where there is associated mild localized edema. More mild involvement of the contralateral right cerebral hemisphere. Subdural hematoma overlying the left cerebral convexity measures up to 4 mm in maximal thickness. No significant mass effect or midline shift. No mass lesion. Mild ventricular prominence related atrophy. No hydrocephalus. Pituitary gland within normal limits. Vascular: Major intracranial vascular flow voids are maintained. Skull and upper cervical spine: Cranial  junction with normal  limits. Bone marrow signal intensity normal. No visible scalp soft tissue abnormality. Sinuses/Orbits: Prior ocular lens replacement on the left. Paranasal sinuses are largely clear. No significant mastoid effusion. Other: None. IMPRESSION: 1. Scattered subarachnoid hemorrhage involving the left greater than right cerebral hemispheres, stable from prior CT. 2. Subdural hematoma overlying the left cerebral convexity measuring up to 4 mm in maximal thickness without significant mass effect or midline shift. 3. No other acute intracranial abnormality.  Negative for infarct. 4. Moderately advanced cerebral and cerebellar atrophy with remote lacunar infarct at the left basal ganglia. Electronically Signed   By: Morene Hoard M.D.   On: 01/02/2024 18:16   CT HEAD WO CONTRAST ( ) Result Date: 01/02/2024 EXAM: CT HEAD WITHOUT CONTRAST 01/02/2024 12:38:01 PM TECHNIQUE: CT of the head was performed without the administration of intravenous contrast. Automated exposure control, iterative reconstruction, and/or weight based adjustment of the mA/kV was utilized to reduce the radiation dose to as low as reasonably achievable. COMPARISON: 12/27/2023 CLINICAL HISTORY: Neuro deficit, acute, stroke suspected. FINDINGS: BRAIN AND VENTRICLES: No acute hemorrhage. Decreased subarachnoid hemorrhage involving left greater than right cerebral hemispheres. Decreased hemorrhage in left temporal lobe with decreased edema in this region. Unchanged subdural hematoma over left cerebral convexity measuring up to 5 mm in thickness. Decreased small volume intraventricular hemorrhage in lateral ventricles. Stable cerebral atrophy. No evidence of acute infarct. No hydrocephalus. No mass effect or midline shift. ORBITS: Left cataract extraction. SINUSES: Mild mucosal thickening in paranasal sinuses. SOFT TISSUES AND SKULL: No acute soft tissue abnormality. No skull fracture. Calcified atherosclerosis at skull base. IMPRESSION: 1.  Decreased subarachnoid hemorrhage involving left greater than right cerebral hemispheres. 2. Decreased hemorrhage in left temporal lobe with decreased edema in this region. 3. Unchanged subdural hematoma over left cerebral convexity measuring up to 5 mm in thickness. 4. Decreased small volume intraventricular hemorrhage in the lateral ventricles. Electronically signed by: Evalene Coho MD 01/02/2024 12:47 PM EST RP Workstation: [id number]   Overnight EEG with video Result Date: 12/28/2023 Shelton Arlin KIDD, MD     12/28/2023 10:37 AM Patient Name: Kellen Dutch MRN: [id number] Epilepsy Attending: Arlin KIDD Shelton Referring Physician/Provider: Voncile Isles, MD Duration: 12/27/2023 0918 to 12/28/2023  9081 Patient history: 83 y.o. male on Eliquis  with fall last Saturday, presented as Lyndell Gillyard code stroke with sudden onset speech difficulty. EEG to evaluate for seizure Level of alertness: Awake, asleep AEDs during EEG study: LEV Technical aspects: This EEG study was done with scalp electrodes positioned according to the 10-20 International system of electrode placement. Electrical activity was reviewed with band pass filter of 1-70Hz , sensitivity of 7 uV/mm, display speed of 4mm/sec with Asjah Rauda 60Hz  notched filter applied as appropriate. EEG data were recorded continuously and digitally stored.  Video monitoring was available and reviewed as appropriate. Description: The posterior dominant rhythm consists of 8-9 Hz activity of moderate voltage (25-35 uV) seen predominantly in posterior head regions, symmetric and reactive to eye opening and eye closing. Sleep was characterized by vertex waves, sleep spindles (12 to 14 Hz), maximal frontocentral region.  EEG showed intermittent 3 to 6 Hz theta-delta slowing in left temporal region. One seizure without clinical signs was noted arising from left temporal region on 12/27/2023 at 1314.  During seizure, EEG showed 5-6hz  theta slowing in left temporal region which then involved  all of left hemisphere and evolved into 3-4hz  delta slowing admixed with sharp waves. Duration of seizure was about 35 seconds Hyperventilation and photic stimulation were not performed.  ABNORMALITY - Seizure without clinical signs, left temporal region - Intermittent slow,  left temporal region IMPRESSION: This study showed one seizure without clinical signs arising from left temporal region on 12/27/2023 at 131, lasting for about 35 seconds Additionally there is cortical dysfunction arising from left temporal region likely secondary to underlying structural abnormality. Priyanka MALVA Krebs   CT HEAD POST STROKE FOLLOWUP/TIMED/STAT READ Result Date: 12/27/2023 EXAM: CT HEAD WITHOUT 12/27/2023 11:30:37 AM TECHNIQUE: CT of the head was performed without the administration of intravenous contrast. Automated exposure control, iterative reconstruction, and/or weight based adjustment of the mA/kV was utilized to reduce the radiation dose to as low as reasonably achievable. COMPARISON: Head CT 12/26/2023. CLINICAL HISTORY: Neuro deficit, acute, stroke suspected. FINDINGS: BRAIN AND VENTRICLES: Moderately large volume subarachnoid hemorrhage involving sulci over the left cerebral convexity and the left sylvian fissure as well as Duke Weisensel small amount of right sided subarachnoid hemorrhage are unchanged, as is trace hemorrhage in the lateral ventricles. Suspected intraparenchymal hemorrhage and mild surrounding edema involving the left temporal lobe and insula are also unchanged. Agatha Duplechain small subdural hematoma over the left cerebral convexity measuring 7 mm in thickness is unchanged. No definite new intracranial hemorrhage, interval infarct, hydrocephalus, or midline shift is evident. Shataya Winkles chronic lacunar infarct involving the left caudate body is noted. There is mild cerebral atrophy. Calcified atherosclerosis at the skull base. ORBITS: Left cataract extraction. SINUSES AND MASTOIDS: Mild mucosal thickening in the maxillary sinuses.  Clear mastoid air cells. SOFT TISSUES AND SKULL: No acute skull fracture. No acute soft tissue abnormality. IMPRESSION: 1. Unchanged predominantly left-sided subarachnoid hemorrhage, subdural hemorrhage, and suspected left temporal intraparenchymal hemorrhage and edema. No midline shift or hydrocephalus. Electronically signed by: Dasie Hamburg MD 12/27/2023 11:46 AM EST RP Workstation: [id number]   CT HEAD WO CONTRAST ( ) Addendum Date: 12/26/2023 ** ADDENDUM #1 ** ADDENDUM: Findings discussed with Dr. Jude at 4:51 PM on 12/26/23. ---------------------------------------------------- Electronically signed by: Donnice Mania MD 12/26/2023 05:01 PM EST RP Workstation: [id number]   Result Date: 12/26/2023 ** ORIGINAL REPORT ** EXAM: CT HEAD WITHOUT 12/26/2023 04:09:41 PM TECHNIQUE: CT of the head was performed without the administration of intravenous contrast. Automated exposure control, iterative reconstruction, and/or weight based adjustment of the mA/kV was utilized to reduce the radiation dose to as low as reasonably achievable. COMPARISON: 12/26/2023 CLINICAL HISTORY: SAH FINDINGS: BRAIN AND VENTRICLES: Grossly stable appearance of left sided intracranial hemorrhage. Multiple areas of subarachnoid hemorrhage over the left cerebral convexity and in the sylvian fissure are similar to prior. Suspected component of intra-axial hemorrhage in the left temporal lobe is similar to prior. There is similar appearance of surrounding edema in the left temporal lobe. Question trace blood products layering in the occipital horns. Additional small focus of hemorrhage adjacent to the anterior right temporal lobe is unchanged. Minimal areas of subarachnoid hemorrhage over the right parietal lobe and the vertex are unchanged. There is likely an additional component of mixed attenuation subdural hemorrhage over the left frontoparietal convexity which measures up to 8 mm in maximum thickness (series 4, image 25). No midline  shift. The basilar cisterns are patent. No evidence of acute infarct. No hydrocephalus. ORBITS: Left lens replacement. SINUSES AND MASTOIDS: Mucosal thickening in right maxillary sinus. SOFT TISSUES AND SKULL: No acute skull fracture. No acute soft tissue abnormality. IMPRESSION: 1. Mixed attenuation subdural hemorrhage over the left frontoparietal convexity measuring up to 8 mm in maximum thickness, increased from prior. 2. Similar appearance of left-sided subarachnoid and intraparenchymal hemorrhage and additional small  scattered foci of right-sided subarachnoid. 3. Similar left temporal lobe edema. 4. No midline shift. Electronically signed by: Donnice Mania MD 12/26/2023 04:32 PM EST RP Workstation: [id number]   CT ANGIO HEAD NECK W WO CM (CODE STROKE) Result Date: 12/26/2023 EXAM: CTA HEAD AND NECK WITH _study_datetime_ TECHNIQUE: CTA of the head and neck was performed with the administration of intravenous contrast. Multiplanar 2D and/or 3D reformatted images are provided for review. Automated exposure control, iterative reconstruction, and/or weight based adjustment of the mA/kV was utilized to reduce the radiation dose to as low as reasonably achievable. Stenosis of the internal carotid arteries measured using NASCET criteria. COMPARISON: CT head reported separately today. CLINICAL HISTORY: 83 year old male with acute intracranial hemorrhage. FINDINGS: CTA NECK: AORTIC ARCH AND ARCH VESSELS: Moderate soft and calcified aortic arch atherosclerosis. Normal 3 vessel arch configuration. CAROTID ARTERIES: Right Common Carotid Artery (CCA): Mild right CCA origin atherosclerosis without stenosis. Right Internal Carotid Artery (ICA): Soft and calcified atherosclerosis at the right carotid bifurcation, right ICA and bulb with less than 50% stenosis. Tortuous right ICA distal to the bulb. Moderate right ICA siphon calcified plaque. Mild to moderate right supraclinoid ICA stenosis on series 9 image 124. Left Common  Carotid Artery (CCA): Mild left CCA origin atherosclerosis without stenosis. Left Internal Carotid Artery (ICA): Probable previous left carotid endarterectomy. Superimposed mild soft and calcified plaque at the left ICA origin and bulb with no stenosis. Left ICA siphon moderate calcified plaque and similar mild to moderate left supraclinoid ICA stenosis on series 5 image 115. No dissection or arterial injury. VERTEBRAL ARTERIES: Right Vertebral Artery: Soft and calcified plaque at the right vertebral artery origin without stenosis. Dominant right vertebral artery. Mild right V4 calcified plaque. No significant right vertebral artery stenosis. Left Vertebral Artery: Proximal left subclavian artery atherosclerosis without stenosis. Nondominant left vertebral artery is diminutive, patent to the vertebrobasilar junction with no significant stenosis. LUNGS AND MEDIASTINUM: Partially visible sternotomy wires. SOFT TISSUES: Nonvascular neck soft tissue spaces appear negative. BONES: Widespread cervical spine degeneration, advanced facet arthropathy. No acute osseous abnormality. CTA HEAD: ANTERIOR CIRCULATION: Internal Carotid Arteries (ICA): Mild to moderate right supraclinoid ICA stenosis on series 9 image 124. Mild to moderate left supraclinoid ICA stenosis on series 5 image 115. Anterior Cerebral Arteries (ACA): Normal ACA origins. Mildly dominant right ACA A1 segment. Moderate to severe stenosis of the left ACA A1 on series 11 image 35. Normally anterior communicating artery. No other ACA stenosis. Middle Cerebral Arteries (MCA): Normal MCA origins. Right middle cerebral artery within normal limits. Left MCA M1 segment bifurcates early without stenosis. No left MCA aneurysm identified. Left MCA branches appear normal aside from mild regional mass effect. No branch stenosis. No CTA spot sign identified. No aneurysm. POSTERIOR CIRCULATION: Posterior Cerebral Arteries (PCA): Normal PCA origins. Basilar Artery: No basilar  artery stenosis. Normal SCA origins. Dominant AICA vessels. Diminutive or absent posterior communicating arteries. No aneurysm. OTHER: Major dural venous sinuses are enhancing and appear patent. IMPRESSION: 1. Negative for intracranial aneurysm, large vessel occlusion, or CTA spot sign in association with Acute intracranial hemorrhage. 2. Atherosclerosis including Moderate to severe stenosis of the left ACA A1 segment, up to moderate bilateral supraclinoid ICA stenosis. Evidence of previous left carotid endarterectomy. Electronically signed by: Helayne Hurst MD 12/26/2023 11:32 AM EST RP Workstation: [id number]   CT HEAD CODE STROKE WO CONTRAST Addendum Date: 12/26/2023 ** ADDENDUM #1 ** ADDENDUM: Study discussed by telephone with Dr. Deedra at 11:17 hours on 12/26/2023. He advises this  patient is status post fall, and has left side orbital or scalp bruising. Therefore, posttraumatic intracranial hemorrhage with temporal lobe contusion is also possible: Traumatic brain injury risk stratification: Subarachnoid hemorrhage [facility]): Unilateral multifocal (moderate - MBIG 2) Cerebral contusion, intra-axial, intraparenchymal hemorrhage (IPH): 35 mm hemorrhagic contusion (high - MBIG 3) ---------------------------------------------------- Electronically signed by: Helayne Hurst MD 12/26/2023 11:24 AM EST RP Workstation: [id number]   Result Date: 12/26/2023 ** ORIGINAL REPORT ** EXAM: CT HEAD WITHOUT CONTRAST 12/26/2023 10:44:18 AM TECHNIQUE: CT of the head was performed without the administration of intravenous contrast. Automated exposure control, iterative reconstruction, and/or weight based adjustment of the mA/kV was utilized to reduce the radiation dose to as low as reasonably achievable. COMPARISON: Brain MRI 04/01/2018, Head CT 02/25/2022. CLINICAL HISTORY: 83 year old male with acute neurological deficit, stroke suspected. FINDINGS: BRAIN AND VENTRICLES: Acute hemorrhage in the left hemisphere appears to be Ava Deguire  combination of subarachnoid and intra-axial hematoma (series 5 image 44) and is in proximity to the left MCA bifurcation. Intra-axial blood volume is roughly estimated at 35 x 23 x 23 mm, estimated volume 10 mL. Moderate volume of regional subarachnoid blood mostly tracking along the left operculum toward the vertex. Extra-axial CSF elsewhere is stable from previous exams. No other acute intracranial hemorrhage identified. Hypodense edema surrounding the intra-axial blood. No other acute cerebral edema identified. No IVH or ventriculomegaly. No midline shift. Basilar cisterns remain normal. No suspicious intracranial vascular hyperdensity. Calcified atherosclerosis at the skull base. ORBITS: Interval postoperative changes to the left globe. No gaze deviation. SINUSES: Paranasal sinuses, middle ears and mastoids well aerated. SOFT TISSUES AND SKULL: No acute soft tissue abnormality. No skull fracture. [place] stroke program early CT score - ASPECTS not applicable, acute intracranial hemorrhage. IMPRESSION: 1. Acute left hemisphere hemorrhage with suspected subarachnoid and intra-axial components (estimated 10 mL in left temporal lobe) with some regional hypodense edema. Consider ruptured Left MCA aneurysm. 2. No midline shift or other complicating features. No other acute intracranial abnormality. 3. These results were communicated to Dr. Voncile at 1101 hours on 12/26/2023 by text page via the [facility] messaging system. Electronically signed by: Helayne Hurst MD 12/26/2023 11:04 AM EST RP Workstation: [id number]    Microbiology: No results found for this or any previous visit (from the past 240 hours).   Labs: Basic Metabolic Panel: No results for input(s): NA, K, CL, CO2, GLUCOSE, BUN, CREATININE, CALCIUM , MG, PHOS in the last 168 hours. Liver Function Tests: No results for input(s): AST, ALT, ALKPHOS, BILITOT, PROT, ALBUMIN  in the last 168 hours. No results for input(s):  LIPASE, AMYLASE in the last 168 hours. No results for input(s): AMMONIA in the last 168 hours. CBC: Recent Labs  Lab 01/07/24 0256 01/07/24 0604 01/08/24 0641 01/10/24 0215  WBC 11.2* 10.2 10.3 10.3  HGB 12.3* 12.5* 12.6* 12.4*  HCT 37.1* 37.4* 38.0* 37.9*  MCV 86.9 86.8 86.2 86.9  PLT 224 210 226 200   Cardiac Enzymes: No results for input(s): CKTOTAL, CKMB, CKMBINDEX, TROPONINI in the last 168 hours. BNP: BNP (last 3 results) No results for input(s): BNP in the last 8760 hours.  ProBNP (last 3 results) No results for input(s): PROBNP in the last 8760 hours.  CBG: No results for input(s): GLUCAP in the last 168 hours.     Signed:  Meliton Monte MD.  [facility] 01/12/2024, 9:57 AM       [1]  Allergies Allergen Reactions   Sulfa Antibiotics Rash    Questionable, he developed Abra Lingenfelter diffuse rash 2 days  after stopping Bactrim

## 2024-01-13 ENCOUNTER — Ambulatory Visit: Admitting: Clinical

## 2024-01-13 ENCOUNTER — Other Ambulatory Visit (HOSPITAL_COMMUNITY): Payer: Self-pay | Admitting: Psychiatry

## 2024-01-13 ENCOUNTER — Telehealth: Payer: Self-pay

## 2024-01-13 ENCOUNTER — Other Ambulatory Visit (HOSPITAL_COMMUNITY): Payer: Self-pay

## 2024-01-13 DIAGNOSIS — F4321 Adjustment disorder with depressed mood: Secondary | ICD-10-CM

## 2024-01-13 MED ORDER — SERTRALINE HCL 50 MG PO TABS: 50.0000 mg | ORAL_TABLET | Freq: Every day | ORAL | 5 refills | Status: AC

## 2024-01-13 NOTE — Transitions of Care (Post Inpatient/ED Visit) (Signed)
" ° °  01/13/2024  Name: Senay Sistrunk MRN: [id number] DOB: 08/13/1940  Today's TOC FU Call Status: Today's TOC FU Call Status:: Successful TOC FU Call Completed TOC FU Call Complete Date: 01/13/24  Patient's Name and Date of Birth confirmed. Name, DOB  Transition Care Management Follow-up Telephone Call Date of Discharge: 01/12/24 Discharge Facility: [facility]) Type of Discharge: Inpatient Admission Primary Inpatient Discharge Diagnosis:: brain bleed How have you been since you were released from the hospital?: Better Any questions or concerns?: Yes Patient Questions/Concerns:: wife has question about hospital stopping Wellbutrin   Items Reviewed: Did you receive and understand the discharge instructions provided?: Yes Medications obtained,verified, and reconciled?: Yes (Medications Reviewed) Any new allergies since your discharge?: No Dietary orders reviewed?: Yes Do you have support at home?: Yes People in Home [RPT]: spouse  Medications Reviewed Today: Medications Reviewed Today   Medications were not reviewed in this encounter     Home Care and Equipment/Supplies: Were Home Health Services Ordered?: NA Any new equipment or medical supplies ordered?: NA  Functional Questionnaire: Do you need assistance with bathing/showering or dressing?: Yes Do you need assistance with meal preparation?: Yes Do you need assistance with eating?: No Do you have difficulty maintaining continence: Yes Do you need assistance with getting out of bed/getting out of a chair/moving?: Yes Do you have difficulty managing or taking your medications?: Yes  Follow up appointments reviewed: PCP Follow-up appointment confirmed?: Yes Date of PCP follow-up appointment?: 01/20/24 Follow-up Provider: [facility] Follow-up appointment confirmed?: No Reason Specialist Follow-Up Not Confirmed: Patient has Specialist Provider Number and will Call for Appointment Do you need  transportation to your follow-up appointment?: No Do you understand care options if your condition(s) worsen?: Yes-patient verbalized understanding    SIGNATURE Julian Lemmings, LPN [facility] Nurse Health Advisor Direct Dial [phone]  "

## 2024-01-13 NOTE — Telephone Encounter (Unsigned)
 Copied from CRM [id number]. Topic: General - Other >> Jan 13, 2024  8:51 AM Darshell M wrote: Reason for CRM: Patient's wife wants to speak with Dr. Frann regarding patient following hospitalization for brain bleed. [name]. [phone]

## 2024-01-13 NOTE — Progress Notes (Signed)
 Time: 2:00 pm-2:58 pm CPT Code: [id number]-04 Diagnosis: F43.21, Adjustment Disorder with Depression  Raymond Christensen was seen remotely using secure video conferencing. He was in his home and therapist was in her office at time of appointment. Client is aware of risks of telehealth and consented to a virtual visit. He reflected upon his experience of having been hospitalized for two weeks after he suffered a brain bleed as a result of a recent fall. He also experienced a seizure while in the hospital, which resulted in his wellbutrin  prescription being discontinued. Raymond Christensen's wife was present during session and shared concerns about an increase in irritability and anger, and a desire to have him seen by his psychiatrist to determine appropriate medications. Raymond Christensen and his wife indicated that they had not heard from Raymond Christensen. Therapist shared details of consultation with Raymond Christensen, and encouraged Raymond Christensen and his wife to follow up with his PCP if they cannot reach Raymond Christensen. He is scheduled to be seen again in one week.  12/15: Therapist received a message that Raymond Christensen's wife had called to notify the therapist that he had been found to have a brain bleed and was in the ICU. Therapist called him that evening to check in, and spoke with Raymond Christensen briefly.   12/29: Therapist received a message that Raymond Christensen's wife had called requesting a call back. Therapist called Raymond Christensen's wife that afternoon. She shared that Raymond Christensen had just returned from the hospital that day, and was markedly angry and irritable. She reported that he had been taken off of his wellbutrin  prescription while in the hospital due to having suffered a seizure, and she was trying to contact Raymond Christensen. She requested that the therapist reach out to Raymond Christensen to consult.  Therapist called Raymond Christensen and was able to reach him. Therapist shared information about Raymond Christensen's fall and subsequent hospitalization, and having been taken off of his wellbutrin .  Raymond Christensen indicated that he would have his office follow up to schedule an appointment with Raymond Christensen for 12/30.    Treatment Plan Strengths Client is intelligent, persistent  Preferences No preferences reported  Problems Client is experiencing symptoms of depression, and reports he does not perceive it as depression. He would like to become more dynamic and take control of the depression.  Needs Client would like to be happier within his current circumstances and be able to deal with them more effectively Goals Raymond Christensen would like to return to his wonderful, dynamic, adorable, self. He reported that student used to call him Dr. Joen. He would like to be this way again.  Objective Raymond Christensen would like to reduce his fear of becoming like the people he sees using walkers at his retirement community.   Target Date: 07/31/2024 Modality: Individual Therapy Progress: 0 Frequency: Weekly/biweekly  Diagnosis Adjustment disorder with depression Conditions For Discharge Achievement of treatment goals and objectives  Intake Presenting Problem Raymond Christensen presented at the urging of his son and daughter-in-law seeking an individual therapist. He had recently moved into [place]  (one month prior to initial appointment). He reported being upset by the number of people with physical and cognitive challenges. He noted that his wife started using a walker following her 80th birthday. He had previously thought this was temporary, but it has proven not to be. He shared that he has experienced symptoms of depression sine that time.  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 He has worked in conjunction with psychologists in conservation officer, historic buildings sexuality  and other subjects. He has been in therapy in the past, but was disappointed in the experience. He shared that getting out and about and keeping busy has helped him in the past. 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 He shared that retirement has gone relatively well. 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 He been less involved with it in the past three months. In January of 2025 his wife began experiencing physical challenges and requiring a walker. Dementia was suspected at the time, but her cognitive ability has improved since then. Recent neurology tests have shown no improvement. He and his wife have been married for 65. He reported that he and his wife had not planned to move to [place], but did so at the urging of their children. Prior to the move, they were satisfied with their home and routine. He reported that his wife was hesitant at first but is acclimating. They had planned on traveling, and have a trip planned to the [place]. They had to cancel a trip to [place] planned in May that they have had to cancel. A close friend lost interest in everything and died two months prior to his initial appointment.  Recent Trigger  Raymond Christensen presented at the urging of his family.  Marital and Family Information  Present family concerns/problems: Raymond Christensen reported that her mother has bipolar disorder. She maintains a good relationship with her father, whom other family members suggest may have ASD.  Strengths/resources in the family/friends: Raymond Christensen described supportive relationships with her father an aunt.  Marital/sexual history patterns: Raymond Christensen has a boyfriend.  Family of Origin  Problems in family of origin: He comes from a lower middle class family in the [place]. He grew up in a home with both parents, his aunt, and sister. His father was deployed for the fist three years of his life.  Family background / ethnic factors:  None noted No needs/concerns related to ethnicity reported when asked: No  Education/Vocation  Interpersonal concerns/problems: None reported  Personal strengths: Intelligent, gets along well with others   Military/work problems/concerns: None noted.  Leisure Activities/Daily Functioning  impaired functioning  Legal Status  No Legal Problems  Medical/Nutritional Concerns  Raymond Christensen has a history of cardiac problems. He underwent his first heart attack at 51. He has gotten stints and bypass surgery. He most recently suffered a heart attack nearly one year prior to his initial intake session.   unspecified  Comments: adult onset tourettes, asthma, POTS  Substance use/abuse/dependence  unspecified   Comments: 3-4 alcoholic drinks per week. No problems reported.    General Behavior: cooperative  Attire: appropriate  Gait: Not observed-telehealth  Motor Activity: normal  Stream of Thought - Productivity: spontaneous  Stream of thought - Progression: normal  Stream of thought - Language: normal  Emotional tone and reactions - Mood: normal  Emotional tone and reactions - Affect: appropriate  Mental trend/Content of thoughts - Perception: normal  Mental trend/Content of thoughts - Orientation: normal  Mental trend/Content of thoughts - Memory: normal  Mental trend/Content of thoughts - General knowledge: consistent with education  Insight: good  Judgment: good      Andriette LITTIE Ponto, PhD  Emanii Bugbee L Daltyn Degroat, PhD

## 2024-01-13 NOTE — Telephone Encounter (Signed)
 Called pt lvm and they have appt coming up.

## 2024-01-20 ENCOUNTER — Ambulatory Visit: Admitting: Family Medicine

## 2024-01-20 ENCOUNTER — Ambulatory Visit: Payer: Self-pay | Admitting: Family Medicine

## 2024-01-20 ENCOUNTER — Other Ambulatory Visit: Payer: Self-pay | Admitting: Neurology

## 2024-01-20 ENCOUNTER — Encounter: Payer: Self-pay | Admitting: Family Medicine

## 2024-01-20 ENCOUNTER — Encounter: Payer: Self-pay | Admitting: Neurology

## 2024-01-20 VITALS — BP 120/68 | HR 73 | Temp 97.7°F | Resp 16 | Ht 68.0 in

## 2024-01-20 DIAGNOSIS — K921 Melena: Secondary | ICD-10-CM | POA: Diagnosis not present

## 2024-01-20 DIAGNOSIS — Z8679 Personal history of other diseases of the circulatory system: Secondary | ICD-10-CM | POA: Diagnosis not present

## 2024-01-20 DIAGNOSIS — G40909 Epilepsy, unspecified, not intractable, without status epilepticus: Secondary | ICD-10-CM | POA: Diagnosis not present

## 2024-01-20 LAB — COMPREHENSIVE METABOLIC PANEL WITH GFR
ALT: 33 U/L (ref 3–53)
AST: 21 U/L (ref 5–37)
Albumin: 3.7 g/dL (ref 3.5–5.2)
Alkaline Phosphatase: 78 U/L (ref 39–117)
BUN: 15 mg/dL (ref 6–23)
CO2: 28 meq/L (ref 19–32)
Calcium: 8.8 mg/dL (ref 8.4–10.5)
Chloride: 107 meq/L (ref 96–112)
Creatinine, Ser: 0.79 mg/dL (ref 0.40–1.50)
GFR: 82.85 mL/min
Glucose, Bld: 107 mg/dL — ABNORMAL HIGH (ref 70–99)
Potassium: 4.4 meq/L (ref 3.5–5.1)
Sodium: 140 meq/L (ref 135–145)
Total Bilirubin: 0.5 mg/dL (ref 0.2–1.2)
Total Protein: 5.9 g/dL — ABNORMAL LOW (ref 6.0–8.3)

## 2024-01-20 LAB — CBC
HCT: 38.4 % — ABNORMAL LOW (ref 39.0–52.0)
Hemoglobin: 12.7 g/dL — ABNORMAL LOW (ref 13.0–17.0)
MCHC: 33 g/dL (ref 30.0–36.0)
MCV: 86.9 fl (ref 78.0–100.0)
Platelets: 175 K/uL (ref 150.0–400.0)
RBC: 4.42 Mil/uL (ref 4.22–5.81)
RDW: 14.2 % (ref 11.5–15.5)
WBC: 7 K/uL (ref 4.0–10.5)

## 2024-01-20 MED ORDER — OXCARBAZEPINE 300 MG PO TABS: 300.0000 mg | ORAL_TABLET | Freq: Two times a day (BID) | ORAL | 3 refills | Status: DC

## 2024-01-20 MED ORDER — LEVETIRACETAM 500 MG PO TABS: 500.0000 mg | ORAL_TABLET | Freq: Two times a day (BID) | ORAL | 1 refills | Status: DC

## 2024-01-20 NOTE — Progress Notes (Signed)
 Chief Complaint  Raymond Christensen presents with   Hospitalization Follow-up    Hospital Follow Up    Subjective: Raymond Christensen is a 84 y.o. male here for hospital follow-up.  He is here with his wife who helps with the history.  On 12/20/2023, the Raymond Christensen got lightheaded and fell.  He was doing well until 12/12 restarted having slurred speech.  He went to the emergency department and was found to have an acute left cerebral brain bleed.  He was admitted for further evaluation.  He progressed well from the ICU and was transferred back to the floor.  Lightheadedness limited his ability to participate in physical therapy.  Balance is getting better and he is walking with a walker.  He is working with physical therapy.  He has been taking Keppra  1000 mg twice daily after he had a seizure in the hospital.  Compliant, mostly no adverse effects.  He has noticed his mood has been significantly more irritable.  He sees psychiatry for this.  No recent changes in his medication.  He is compliant with those as well.  He is wondering if he can decrease his Keppra  as he suspects this may be a side effect from it.  He has an appointment with the neurology team in around 2 months.  Other than the irritability as listed above, his appetite is coming back.  He feels relatively well overall.  Pt notes he is fatigued and sleeping more. He feels his balance is improving.  He has not had any episodes of lightheadedness that he can remember.  He has been taking midodrine  2.5 mg twice daily.  No adverse effects with this.  He was reported to have an episode of melena in the hospital.  He was told to follow-up with the gastroenterology team.  He has not had any issues since then.  Past Medical History:  Diagnosis Date   Arthritis    CAD (coronary artery disease)    Cancer (HCC)    skin lt. arm.   Carotid artery disease    s/p left CEA 01/17/14   Chronic systolic CHF (congestive heart failure) (HCC)    Concussion Summer 2012    Essential hypertension 02/26/2017   Heart disease    Hyperlipidemia    Hypothyroidism 10/15/2017   Major depressive disorder    Mitral valve regurgitation    Myocardial infarction [facility]) 1996, 2021   Obstructive sleep apnea on CPAP    Persistent atrial fibrillation (HCC)    Poor flexibility of tendon 12/01/2018   S/P mitral valve clip implantation 12/23/2019   s/p TEER with MitraClip with one XTW by Dr. Wonda   Subungual hematoma of digit of hand 12/01/2018   Subungual hematoma of right foot 12/01/2018   Type 2 diabetes mellitus without complication, without long-term current use of insulin  (HCC) 02/26/2017    Objective: BP 120/68 (BP Location: Left Arm, Raymond Christensen Position: Sitting)   Pulse 73   Temp 97.7 F (36.5 C) (Oral)   Resp 16   Ht 5' 8 (1.727 m)   SpO2 97%   BMI 30.20 kg/m  General: Awake, appears stated age Heart: RRR, no LE edema Lungs: CTAB, no rales, wheezes or rhonchi. No accessory muscle use Neuro: No cerebellar signs, speech is fluent and goal oriented Psych: Memory seems poor, does become irritable throughout the exam, normal affect  Assessment and Plan: Seizure disorder (HCC) - Plan: CBC, Comprehensive metabolic panel with GFR  Melena  History of subdural hemorrhage  Appreciate neurology input.  Follow-up  on labs as above. Will reduce Keppra  to 500 mg bid given sleepiness (could be from recovering from brain bleed) and mood issues that are not resolving.  Check stool for blood.  He denies any continuing episodes from this. As above.  The Raymond Christensen and his spouse voiced understanding and agreement to the plan.  I spent 50 min w the pt and his wife discussing the above plans in addition to reviewing his chart and the same day the visit.  Mabel Mt Tilden, DO 01/20/2024  12:16 PM

## 2024-01-20 NOTE — Patient Instructions (Signed)
 Give us  2-3 business days to get the results of your labs back.   Stay active.  Let me know if you don't hear anything about the Keppra  by the end of the week.   Let us  know if you need anything.

## 2024-01-21 ENCOUNTER — Ambulatory Visit (INDEPENDENT_AMBULATORY_CARE_PROVIDER_SITE_OTHER): Admitting: Clinical

## 2024-01-21 DIAGNOSIS — F4321 Adjustment disorder with depressed mood: Secondary | ICD-10-CM | POA: Diagnosis not present

## 2024-01-21 NOTE — Progress Notes (Signed)
 Time: 3:00 pm-3:58 pm CPT Code: [id number]-04 Diagnosis: F43.21, Adjustment Disorder with Depression  Raymond Christensen was seen remotely using secure video conferencing. He was in his home and therapist was in her office at time of appointment. Client is aware of risks of telehealth and consented to a virtual visit. Session began by helping Raymond Christensen and his wife connect to the virtual visit. They shared that Dr. Tasia had followed up and was able to change Raymond Christensen's prescription to certraline. Dr. Creston had also been prescribed a new anti-seizure medication. Raymond Christensen expressed his frustrations with his current situation, including concerns that he does not need to be on all the medication he is on. Therapist suggested writing down questions he can ask his doctors to address his concerns, and session focused on composing a list. He also expressed irritation with fellow residents in the dining room. Therapist offered validation and support, encouraging Raymond Christensen and Raymond Christensen to see if they can sit with preferred others for the time being, as well as considering how he can ignore people who bother him. He is scheduled to be seen again in one week.   Treatment Plan Strengths Client is intelligent, persistent  Preferences No preferences reported  Problems Client is experiencing symptoms of depression, and reports he does not perceive it as depression. He would like to become more dynamic and take control of the depression.  Needs Client would like to be happier within his current circumstances and be able to deal with them more effectively Goals Raymond Christensen would like to return to his wonderful, dynamic, adorable, self. He reported that student used to call him Raymond Christensen. He would like to be this way again.  Objective Raymond Christensen would like to reduce his fear of becoming like the people he sees using walkers at his retirement community.   Target Date: 07/31/2024 Modality: Individual Therapy Progress: 0 Frequency:  Weekly/biweekly  Diagnosis Adjustment disorder with depression Conditions For Discharge Achievement of treatment goals and objectives  Intake Presenting Problem Raymond Christensen presented at the urging of his son and daughter-in-law seeking an individual therapist. He had recently moved into [place]  (one month prior to initial appointment). He reported being upset by the number of people with physical and cognitive challenges. He noted that his wife started using a walker following her 80th birthday. He had previously thought this was temporary, but it has proven not to be. He shared that he has experienced symptoms of depression sine that time.  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 He has worked in conjunction with psychologists in conservation officer, historic buildings sexuality and other subjects. He has been in therapy in the past, but was disappointed in the experience. He shared that getting out and about and keeping busy has helped him in the past. 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 He shared that retirement has gone relatively well. 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 He been less involved with it in the past three months. In January of 2025 his wife began experiencing physical challenges and requiring a walker. Dementia was suspected at the time, but her cognitive ability has improved since then. Recent neurology tests have shown no improvement. He and his wife have been married  for 56. He reported that he and his wife had not planned to move to [place], but did so at the urging of their children. Prior to the move, they were satisfied with their home and routine. He reported that  his wife was hesitant at first but is acclimating. They had planned on traveling, and have a trip planned to the [place]. They had to cancel a trip to [place] planned in May that they have had to cancel. A close friend lost interest in everything and died two months prior to his initial appointment.  Recent Trigger  Raymond Christensen presented at the urging of his family.  Marital and Family Information  Present family concerns/problems: Raymond Christensen reported that her mother has bipolar disorder. She maintains a good relationship with her father, whom other family members suggest may have ASD.  Strengths/resources in the family/friends: Raymond Christensen described supportive relationships with her father an aunt.  Marital/sexual history patterns: Raymond Christensen has a boyfriend.  Family of Origin  Problems in family of origin: He comes from a lower middle class family in the [place]. He grew up in a home with both parents, his aunt, and sister. His father was deployed for the fist three years of his life.  Family background / ethnic factors: None noted No needs/concerns related to ethnicity reported when asked: No  Education/Vocation  Interpersonal concerns/problems: None reported  Personal strengths: Intelligent, gets along well with others   Military/work problems/concerns: None noted.  Leisure Activities/Daily Functioning  impaired functioning  Legal Status  No Legal Problems  Medical/Nutritional Concerns  Raymond Christensen has a history of cardiac problems. He underwent his first heart attack at 51. He has gotten stints and bypass surgery. He most recently suffered a heart attack nearly one year prior to his initial intake session.   unspecified  Comments: adult onset tourettes, asthma, POTS  Substance use/abuse/dependence  unspecified   Comments: 3-4 alcoholic drinks per week. No problems reported.    General Behavior: cooperative  Attire: appropriate  Gait: Not observed-telehealth  Motor Activity: normal  Stream of  Thought - Productivity: spontaneous  Stream of thought - Progression: normal  Stream of thought - Language: normal  Emotional tone and reactions - Mood: normal  Emotional tone and reactions - Affect: appropriate  Mental trend/Content of thoughts - Perception: normal  Mental trend/Content of thoughts - Orientation: normal  Mental trend/Content of thoughts - Memory: normal  Mental trend/Content of thoughts - General knowledge: consistent with education  Insight: good  Judgment: good     Andriette LITTIE Ponto, PhD

## 2024-01-26 ENCOUNTER — Encounter: Payer: Self-pay | Admitting: Neurology

## 2024-01-26 ENCOUNTER — Encounter: Payer: Self-pay | Admitting: Family Medicine

## 2024-01-28 ENCOUNTER — Ambulatory Visit: Payer: Self-pay

## 2024-01-28 NOTE — Telephone Encounter (Signed)
 1130 Friday. If he starts having CP or SOB, seek immediate care. Cont to monitor BP at home and also bring that monitor in during the visit. Thx.

## 2024-01-28 NOTE — Telephone Encounter (Signed)
 FYI Only or Action Required?: FYI only for provider: appointment scheduled on 01/29/2024.  Patient was last seen in primary care on 01/20/2024 by Frann Mabel Mt, DO.  Called Nurse Triage reporting Hypertension.  Symptoms began today.  Interventions attempted: Prescription medications: metoprolol , .  Symptoms are: stable.  Triage Disposition: See PCP When Office is Open (Within 3 Days)  Patient/caregiver understands and will follow disposition?:   Copied from CRM #[id number]. Topic: Clinical - Red Word Triage >> Jan 28, 2024  5:27 PM Charolett L wrote: [facility] that prompted transfer to Nurse Triage: elevated blood pressure of 190/99 Reason for Disposition  Systolic BP >= 160 OR Diastolic >= 100  Answer Assessment - Initial Assessment Questions Patient usually runs low. This morning 146/81, 141/77 2pm 170/88 56, 176/93. Home health team advised call EMS due to BP, 130/90 when EMS came 2:30. So they did not take him to ER. Patient went to physical therapy 190/99 at the end of physical therapy. Most recent  BP taken at home by home health nurse162/88  pulse 57 Antiseizure medication Started weaning him off keppra  1000mg  BID, currently taking  500mg  once daily. Currently oxcareavetine 300mg  twice a day, started this med 01/22/2024  4. HISTORY: Do you have a history of high blood pressure?     No, but had a head injury 3 weeks ago with blood pressure problems since. 5. MEDICINES: Are you taking any medicines for blood pressure? Have you missed any doses recently?     Metoprolol  12.5mg  in the morning, has someone that sets it up and says it  6. OTHER SYMPTOMS: Do you have any symptoms? (e.g., blurred vision, chest pain, difficulty breathing, headache, weakness)  Protocols used: Blood Pressure - High-A-AH

## 2024-01-28 NOTE — Telephone Encounter (Signed)
 Wife of pt calling to report the pt's BP is 170/88, denies all other symptoms. Wife of pt using loud,anxious voice and refusing to answer all NT questions, stating have Dr. Frann fit us  [place] Please reach out to pt if there is any availability. Offered an appt with other provider and wife of pt refused.    FYI Only or Action Required?: Action required by provider: request for appointment.  Patient was last seen in primary care on 01/20/2024 by Frann Mabel Mt, DO.  Called Nurse Triage reporting Hypertension.  Symptoms began today.  Interventions attempted: Nothing.  Symptoms are: stable.  Triage Disposition: See PCP When Office is Open (Within 3 Days)  Patient/caregiver understands and will follow disposition?: No, wishes to speak with PCP  Copied from CRM #[id number]. Topic: Clinical - Red Word Triage >> Jan 28, 2024  2:09 PM Macario HERO wrote: Red Word that prompted transfer to Nurse Triage: Patient spouse called said his blood pressure has been running high. This morning it was 141/77 and now 170/88. Reason for Disposition  Systolic BP >= 160 OR Diastolic >= 100  Answer Assessment - Initial Assessment Questions 1. BLOOD PRESSURE: What is your blood pressure? Did you take at least two measurements 5 minutes apart?     170/88 with a HR 56  2. ONSET: When did you take your blood pressure?     1400  3. HOW: How did you take your blood pressure? (e.g., automatic home BP monitor, visiting nurse)     Home cuff  4. HISTORY: Do you have a history of high blood pressure?     No  6. OTHER SYMPTOMS: Do you have any symptoms? (e.g., blurred vision, chest pain, difficulty breathing, headache, weakness)     No  Protocols used: Blood Pressure - High-A-AH

## 2024-01-29 ENCOUNTER — Encounter: Payer: Self-pay | Admitting: Student

## 2024-01-29 ENCOUNTER — Ambulatory Visit: Admitting: Student

## 2024-01-29 ENCOUNTER — Telehealth: Payer: Self-pay | Admitting: Internal Medicine

## 2024-01-29 VITALS — BP 173/71 | HR 58 | Temp 98.1°F | Resp 14 | Ht 68.0 in | Wt 209.4 lb

## 2024-01-29 DIAGNOSIS — G40909 Epilepsy, unspecified, not intractable, without status epilepticus: Secondary | ICD-10-CM | POA: Insufficient documentation

## 2024-01-29 DIAGNOSIS — I10 Essential (primary) hypertension: Secondary | ICD-10-CM

## 2024-01-29 DIAGNOSIS — R195 Other fecal abnormalities: Secondary | ICD-10-CM | POA: Insufficient documentation

## 2024-01-29 DIAGNOSIS — Z8679 Personal history of other diseases of the circulatory system: Secondary | ICD-10-CM | POA: Diagnosis not present

## 2024-01-29 NOTE — Assessment & Plan Note (Deleted)
 Monitor BP. Goal 130/80. Exercise should be stopped if the systolic blood pressure reaches >190 or if the diastolic pressure reaches >100 mmHg or higher. Seek immediate care if you develop chest pain, dizziness, shortness of breath, vision changes, HA.

## 2024-01-29 NOTE — Progress Notes (Addendum)
" ° °  Acute Office Visit  Subjective:     Patient ID: Raymond Christensen, male    DOB: April 30, 1940, 84 y.o.   MRN: [id number]  Chief Complaint  Patient presents with   Hypertension    Patient has been high blood pressure readings for the last couple of days.    HPI Patient is in today for acute visit.  PMHx- PAF, CAD, CHF,HLD, Hypothyroid, MVR, Hx MI, OSA, DM Type 2  The patient presents for evaluation of elevated and fluctuating blood pressures over the past 5 days. Presents with wife and friend to OV.  Per the patients wife, he typically runs on the lower side, but today his blood pressure has been slightly elevated, with readings in the 140s/80s and  a few measurements in the 160-170s SBP. Yesterday wife reports EMS was called r/t BP, but when they arrived around 2:30 PM his BP was 130/90, so he was not transported to the ED.  Later during physical therapy, his BP increased to 190/90s.   Prior to this visit on 12/20/2023, the patient got lightheaded and fell. On12/12/25  had slurred speech, went to the emergency department and was found to have an acute left cerebral brain bleed. Was seen for hospital FU on 01/20/2023 following this admission.  He is currently taking metoprolol  12.5 mg daily. He is also undergoing a seizure medication transition, having been weaned from Keppra  1000 mg twice daily to 500 mg once daily, and recently started oxcarbazepine  300 mg twice daily on 01/22/24. He denies chest pain, shortness of breath, headache, weakness, confusion, visual changes, falls, syncope.    ROS See HPI     Objective:    BP (!) 173/71   Pulse (!) 58   Temp 98.1 F (36.7 C) (Oral)   Resp 14   Ht 5' 8 (1.727 m)   Wt 209 lb 6.4 oz (95 kg)   SpO2 98%   BMI 31.84 kg/m    Physical Exam  General: Awake, appears stated age Heart: RRR, no LE edema Lungs: CTAB, no rales, wheezes or rhonchi. No accessory muscle use Neuro: No cerebellar signs, speech is fluent and goal  oriented Psych: + confusion  No results found for any visits on 01/29/24.  Vitals:   01/29/24 1429 01/29/24 1505  BP: (!) 164/79 (!) 173/71  Pulse: (!) 58   Resp: 14   Temp: 98.1 F (36.7 C)   SpO2: 98%        Assessment & Plan:   Problem List Items Addressed This Visit       Cardiovascular and Mediastinum   Essential hypertension - Primary   Pts home BP cuff  working improperly, advised to obtain a new cuff and ensure proper fit.  -Pt has Hx of symptomatic hypotension, has been prescribed midodrine  daily. Advise stopping Midodrine , discuss with cardiologist.  - Monitor BP at home. Stop exercise if SBP >190 or DBP >100 and seek urgent care for chest pain, dizziness, shortness of breath, vision changes, or headache. -BP goal <140/90            Nervous and Auditory   Seizure disorder [facility])   Encourage Neurology Follow up for medication management.        Other   History of subdural hemorrhage   Stable. Asymptomatic.         No orders of the defined types were placed in this encounter.     No follow-ups on file.  Harlene LITTIE Jolly, NP   "

## 2024-01-29 NOTE — Telephone Encounter (Signed)
 Pt is being seen today by Harlene GRADE.

## 2024-01-29 NOTE — Telephone Encounter (Signed)
 Appt scheduled

## 2024-01-29 NOTE — Assessment & Plan Note (Signed)
 Refer to GI for further evaluation.

## 2024-01-29 NOTE — Telephone Encounter (Signed)
 Spoke with pt's wife per [facility] regarding pt's blood pressure. Wife stated the pt's blood pressure was 190/99 last night and 163/90 today at his PCP's office. Then 164/79 later today. Pt's wife stated that the pt is not having any symptoms of headache, blurred vision, nausea, vomiting, chest pain or shortness of breath. The pt and his wife were in the car at the time of this call and unable to take his blood pressure. An appointment was made for the pt on 1/27 with Orren Fabry. Pt's wife was given ED precautions for the meantime. Pt's wife verbalized understanding. All questions if any were answered.

## 2024-01-29 NOTE — Telephone Encounter (Signed)
 Pt c/o BP issue: STAT if pt c/o blurred vision, one-sided weakness or slurred speech.  STAT if BP is GREATER than 180/120 TODAY.  STAT if BP is LESS than 90/60 and SYMPTOMATIC TODAY  1. What is your BP concern?   See below  2. Have you taken any BP medication today?  Yes  3. What are your last 5 BP readings?   190/99 last night 163/90  at PCP's office today  4. Are you having any other symptoms (ex. Dizziness, headache, blurred vision, passed out)?   No  Wife Nadia) is concerned patient's BP has been trending high.

## 2024-01-29 NOTE — Assessment & Plan Note (Signed)
 Encourage Neurology Follow up for medication management.

## 2024-01-29 NOTE — Assessment & Plan Note (Addendum)
"  Stable   Asymptomatic   "

## 2024-01-29 NOTE — Assessment & Plan Note (Addendum)
 Pts home BP cuff  working improperly, advised to obtain a new cuff and ensure proper fit.  -Pt has Hx of symptomatic hypotension, has been prescribed midodrine  daily. Advise stopping Midodrine , discuss with cardiologist.  - Monitor BP at home. Stop exercise if SBP >190 or DBP >100 and seek urgent care for chest pain, dizziness, shortness of breath, vision changes, or headache. -BP goal <140/90

## 2024-01-30 NOTE — Telephone Encounter (Signed)
 Called spouse ok per DPR.  Spouse reports pt is taking midodrine  advised to STOP medication.  Advised this medication is needed for low BP.  Advised to continue to monitor BP and contact our office if has further concerns.  Spouse expresses understanding.

## 2024-02-01 ENCOUNTER — Emergency Department (HOSPITAL_COMMUNITY)

## 2024-02-01 ENCOUNTER — Inpatient Hospital Stay (HOSPITAL_COMMUNITY)
Admission: EM | Admit: 2024-02-01 | Discharge: 2024-02-04 | DRG: 641 | Disposition: A | Discharge status: Home / Self Care | Attending: Internal Medicine | Admitting: Internal Medicine

## 2024-02-01 ENCOUNTER — Encounter (HOSPITAL_COMMUNITY): Payer: Self-pay

## 2024-02-01 DIAGNOSIS — N3281 Overactive bladder: Secondary | ICD-10-CM | POA: Diagnosis present

## 2024-02-01 DIAGNOSIS — Z9079 Acquired absence of other genital organ(s): Secondary | ICD-10-CM

## 2024-02-01 DIAGNOSIS — R4781 Slurred speech: Secondary | ICD-10-CM | POA: Diagnosis present

## 2024-02-01 DIAGNOSIS — E785 Hyperlipidemia, unspecified: Secondary | ICD-10-CM | POA: Diagnosis present

## 2024-02-01 DIAGNOSIS — F411 Generalized anxiety disorder: Secondary | ICD-10-CM

## 2024-02-01 DIAGNOSIS — I252 Old myocardial infarction: Secondary | ICD-10-CM

## 2024-02-01 DIAGNOSIS — Z96643 Presence of artificial hip joint, bilateral: Secondary | ICD-10-CM | POA: Diagnosis present

## 2024-02-01 DIAGNOSIS — Z7901 Long term (current) use of anticoagulants: Secondary | ICD-10-CM

## 2024-02-01 DIAGNOSIS — N401 Enlarged prostate with lower urinary tract symptoms: Secondary | ICD-10-CM | POA: Diagnosis present

## 2024-02-01 DIAGNOSIS — E871 Hypo-osmolality and hyponatremia: Principal | ICD-10-CM | POA: Diagnosis present

## 2024-02-01 DIAGNOSIS — Z951 Presence of aortocoronary bypass graft: Secondary | ICD-10-CM

## 2024-02-01 DIAGNOSIS — Z79899 Other long term (current) drug therapy: Secondary | ICD-10-CM

## 2024-02-01 DIAGNOSIS — Z7985 Long-term (current) use of injectable non-insulin antidiabetic drugs: Secondary | ICD-10-CM

## 2024-02-01 DIAGNOSIS — R350 Frequency of micturition: Secondary | ICD-10-CM

## 2024-02-01 DIAGNOSIS — S069XAS Unspecified intracranial injury with loss of consciousness status unknown, sequela: Secondary | ICD-10-CM

## 2024-02-01 DIAGNOSIS — G4089 Other seizures: Secondary | ICD-10-CM

## 2024-02-01 DIAGNOSIS — K219 Gastro-esophageal reflux disease without esophagitis: Secondary | ICD-10-CM | POA: Diagnosis present

## 2024-02-01 DIAGNOSIS — E039 Hypothyroidism, unspecified: Secondary | ICD-10-CM | POA: Diagnosis present

## 2024-02-01 DIAGNOSIS — Z955 Presence of coronary angioplasty implant and graft: Secondary | ICD-10-CM

## 2024-02-01 DIAGNOSIS — E119 Type 2 diabetes mellitus without complications: Secondary | ICD-10-CM | POA: Diagnosis present

## 2024-02-01 DIAGNOSIS — E878 Other disorders of electrolyte and fluid balance, not elsewhere classified: Secondary | ICD-10-CM

## 2024-02-01 DIAGNOSIS — Z882 Allergy status to sulfonamides status: Secondary | ICD-10-CM

## 2024-02-01 DIAGNOSIS — Z8673 Personal history of transient ischemic attack (TIA), and cerebral infarction without residual deficits: Secondary | ICD-10-CM

## 2024-02-01 DIAGNOSIS — I2510 Atherosclerotic heart disease of native coronary artery without angina pectoris: Secondary | ICD-10-CM | POA: Diagnosis present

## 2024-02-01 DIAGNOSIS — I4819 Other persistent atrial fibrillation: Secondary | ICD-10-CM | POA: Diagnosis present

## 2024-02-01 DIAGNOSIS — R29810 Facial weakness: Secondary | ICD-10-CM | POA: Diagnosis not present

## 2024-02-01 DIAGNOSIS — I5032 Chronic diastolic (congestive) heart failure: Secondary | ICD-10-CM | POA: Diagnosis present

## 2024-02-01 DIAGNOSIS — E860 Dehydration: Secondary | ICD-10-CM | POA: Diagnosis present

## 2024-02-01 DIAGNOSIS — R29701 NIHSS score 1: Secondary | ICD-10-CM | POA: Diagnosis present

## 2024-02-01 DIAGNOSIS — G4733 Obstructive sleep apnea (adult) (pediatric): Secondary | ICD-10-CM | POA: Diagnosis present

## 2024-02-01 DIAGNOSIS — Z87898 Personal history of other specified conditions: Secondary | ICD-10-CM

## 2024-02-01 DIAGNOSIS — I340 Nonrheumatic mitral (valve) insufficiency: Secondary | ICD-10-CM | POA: Diagnosis present

## 2024-02-01 DIAGNOSIS — Z87891 Personal history of nicotine dependence: Secondary | ICD-10-CM

## 2024-02-01 DIAGNOSIS — F419 Anxiety disorder, unspecified: Secondary | ICD-10-CM | POA: Diagnosis present

## 2024-02-01 DIAGNOSIS — I110 Hypertensive heart disease with heart failure: Secondary | ICD-10-CM | POA: Diagnosis present

## 2024-02-01 DIAGNOSIS — Z79890 Hormone replacement therapy (postmenopausal): Secondary | ICD-10-CM

## 2024-02-01 DIAGNOSIS — R4701 Aphasia: Principal | ICD-10-CM

## 2024-02-01 DIAGNOSIS — F32A Depression, unspecified: Secondary | ICD-10-CM | POA: Diagnosis present

## 2024-02-01 LAB — DIFFERENTIAL
Abs Immature Granulocytes: 0.02 K/uL (ref 0.00–0.07)
Basophils Absolute: 0 K/uL (ref 0.0–0.1)
Basophils Relative: 0 %
Eosinophils Absolute: 0.1 K/uL (ref 0.0–0.5)
Eosinophils Relative: 2 %
Immature Granulocytes: 0 %
Lymphocytes Relative: 26 %
Lymphs Abs: 2.1 K/uL (ref 0.7–4.0)
Monocytes Absolute: 0.8 K/uL (ref 0.1–1.0)
Monocytes Relative: 9 %
Neutro Abs: 5 K/uL (ref 1.7–7.7)
Neutrophils Relative %: 63 %

## 2024-02-01 LAB — CBC
HCT: 39 % (ref 39.0–52.0)
Hemoglobin: 12.9 g/dL — ABNORMAL LOW (ref 13.0–17.0)
MCH: 28.3 pg (ref 26.0–34.0)
MCHC: 33.1 g/dL (ref 30.0–36.0)
MCV: 85.5 fL (ref 80.0–100.0)
Platelets: 177 K/uL (ref 150–400)
RBC: 4.56 MIL/uL (ref 4.22–5.81)
RDW: 13.5 % (ref 11.5–15.5)
WBC: 8 K/uL (ref 4.0–10.5)
nRBC: 0 % (ref 0.0–0.2)

## 2024-02-01 LAB — COMPREHENSIVE METABOLIC PANEL WITH GFR
ALT: 33 U/L (ref 0–44)
AST: 30 U/L (ref 15–41)
Albumin: 3.8 g/dL (ref 3.5–5.0)
Alkaline Phosphatase: 90 U/L (ref 38–126)
Anion gap: 11 (ref 5–15)
BUN: 9 mg/dL (ref 8–23)
CO2: 22 mmol/L (ref 22–32)
Calcium: 8.8 mg/dL — ABNORMAL LOW (ref 8.9–10.3)
Chloride: 91 mmol/L — ABNORMAL LOW (ref 98–111)
Creatinine, Ser: 0.67 mg/dL (ref 0.61–1.24)
GFR, Estimated: >60 mL/min
Glucose, Bld: 100 mg/dL — ABNORMAL HIGH (ref 70–99)
Potassium: 4.3 mmol/L (ref 3.5–5.1)
Sodium: 124 mmol/L — ABNORMAL LOW (ref 135–145)
Total Bilirubin: 0.4 mg/dL (ref 0.0–1.2)
Total Protein: 6.4 g/dL — ABNORMAL LOW (ref 6.5–8.1)

## 2024-02-01 LAB — I-STAT CHEM 8, ED
BUN: 11 mg/dL (ref 8–23)
Calcium, Ion: 0.98 mmol/L — ABNORMAL LOW (ref 1.15–1.40)
Chloride: 92 mmol/L — ABNORMAL LOW (ref 98–111)
Creatinine, Ser: 0.7 mg/dL (ref 0.61–1.24)
Glucose, Bld: 99 mg/dL (ref 70–99)
HCT: 43 % (ref 39.0–52.0)
Hemoglobin: 14.6 g/dL (ref 13.0–17.0)
Potassium: 5.5 mmol/L — ABNORMAL HIGH (ref 3.5–5.1)
Sodium: 123 mmol/L — ABNORMAL LOW (ref 135–145)
TCO2: 25 mmol/L (ref 22–32)

## 2024-02-01 LAB — PROTIME-INR
INR: 0.9 (ref 0.8–1.2)
Prothrombin Time: 13 s (ref 11.4–15.2)

## 2024-02-01 LAB — CBG MONITORING, ED: Glucose-Capillary: 110 mg/dL — ABNORMAL HIGH (ref 70–99)

## 2024-02-01 LAB — APTT: aPTT: 35 s (ref 24–36)

## 2024-02-01 LAB — ETHANOL: Alcohol, Ethyl (B): <15 mg/dL

## 2024-02-01 MED ORDER — SERTRALINE HCL 50 MG PO TABS
50.0000 mg | ORAL_TABLET | Freq: Every day | ORAL | Status: DC
  Administered 2024-02-02 – 2024-02-04 (×3): 50 mg via ORAL
  Filled 2024-02-01 (×3): qty 1

## 2024-02-01 MED ORDER — ATORVASTATIN CALCIUM 80 MG PO TABS
80.0000 mg | ORAL_TABLET | Freq: Every day | ORAL | Status: DC
  Administered 2024-02-02 – 2024-02-04 (×3): 80 mg via ORAL
  Filled 2024-02-01 (×2): qty 1
  Filled 2024-02-01: qty 2

## 2024-02-01 MED ORDER — LIOTHYRONINE SODIUM 5 MCG PO TABS
10.0000 ug | ORAL_TABLET | Freq: Every day | ORAL | Status: DC
  Administered 2024-02-02 – 2024-02-04 (×3): 10 ug via ORAL
  Filled 2024-02-01 (×4): qty 2

## 2024-02-01 MED ORDER — LEVOTHYROXINE SODIUM 75 MCG PO TABS
75.0000 ug | ORAL_TABLET | Freq: Every day | ORAL | Status: DC
  Administered 2024-02-02 – 2024-02-04 (×3): 75 ug via ORAL
  Filled 2024-02-01 (×3): qty 1

## 2024-02-01 MED ORDER — PAROXETINE HCL 10 MG PO TABS
10.0000 mg | ORAL_TABLET | Freq: Every day | ORAL | Status: DC
  Administered 2024-02-02 – 2024-02-04 (×3): 10 mg via ORAL
  Filled 2024-02-01 (×4): qty 1

## 2024-02-01 MED ORDER — FINASTERIDE 5 MG PO TABS
5.0000 mg | ORAL_TABLET | Freq: Every day | ORAL | Status: DC
  Administered 2024-02-02 – 2024-02-04 (×3): 5 mg via ORAL
  Filled 2024-02-01 (×3): qty 1

## 2024-02-01 MED ORDER — EZETIMIBE 10 MG PO TABS
10.0000 mg | ORAL_TABLET | Freq: Every day | ORAL | Status: DC
  Administered 2024-02-02 – 2024-02-04 (×3): 10 mg via ORAL
  Filled 2024-02-01 (×3): qty 1

## 2024-02-01 MED ORDER — LORAZEPAM 1 MG PO TABS: 0.5000 mg | ORAL_TABLET | Freq: Two times a day (BID) | ORAL | Status: DC

## 2024-02-01 MED ORDER — FAMOTIDINE 20 MG PO TABS
20.0000 mg | ORAL_TABLET | Freq: Every day | ORAL | Status: DC
  Administered 2024-02-02 – 2024-02-04 (×3): 20 mg via ORAL
  Filled 2024-02-01 (×3): qty 1

## 2024-02-01 MED ORDER — INSULIN ASPART 100 UNIT/ML IJ SOLN: 0.0000 [IU] | Freq: Three times a day (TID) | INTRAMUSCULAR | Status: DC

## 2024-02-01 MED ORDER — ACETAMINOPHEN 500 MG PO TABS
1000.0000 mg | ORAL_TABLET | Freq: Once | ORAL | Status: AC
  Administered 2024-02-01: 1000 mg via ORAL
  Filled 2024-02-01: qty 2

## 2024-02-01 MED ORDER — ACETAMINOPHEN 325 MG PO TABS: 650.0000 mg | ORAL_TABLET | Freq: Four times a day (QID) | ORAL | Status: DC | PRN

## 2024-02-01 MED ORDER — HYDRALAZINE HCL 20 MG/ML IJ SOLN
5.0000 mg | INTRAMUSCULAR | Status: DC | PRN
  Administered 2024-02-03: 5 mg via INTRAVENOUS
  Filled 2024-02-01: qty 1

## 2024-02-01 MED ORDER — ACETAMINOPHEN 650 MG RE SUPP: 650.0000 mg | Freq: Four times a day (QID) | RECTAL | Status: DC | PRN

## 2024-02-01 MED ORDER — SODIUM CHLORIDE 0.9% FLUSH
3.0000 mL | Freq: Once | INTRAVENOUS | Status: AC
  Administered 2024-02-01: 3 mL via INTRAVENOUS

## 2024-02-01 MED ORDER — LACTATED RINGERS IV BOLUS
1000.0000 mL | Freq: Once | INTRAVENOUS | Status: AC
  Administered 2024-02-01: 1000 mL via INTRAVENOUS

## 2024-02-01 MED ORDER — SODIUM CHLORIDE 0.9 % IV SOLN: INTRAVENOUS | Status: AC

## 2024-02-01 MED ORDER — DOCUSATE SODIUM 100 MG PO CAPS: 100.0000 mg | ORAL_CAPSULE | Freq: Every evening | ORAL | Status: DC | PRN

## 2024-02-01 MED ORDER — INSULIN ASPART 100 UNIT/ML IJ SOLN: 0.0000 [IU] | Freq: Every day | INTRAMUSCULAR | Status: DC

## 2024-02-01 NOTE — H&P (Incomplete)
 " History and Physical    Raymond Christensen FMW:[id number] DOB: 02/04/1940 DOA: 02/01/2024  PCP: Frann Mabel Mt, DO  Chief Complaint: Slurred speech  HPI: Raymond Christensen is a 84 y.o. male with medical history significant of CAD status post CABG, carotid artery disease status post left CEA in 2016, CHF, mitral regurgitation status post TEER with MitraClip in 2021, A-fib, hypertension hyperlipidemia, hypothyroidism, type 2 diabetes, depression, anxiety, OSA on CPAP, GERD, BPH, overactive bladder.  He was admitted to the hospital last month 12/12-12/29 for traumatic left temporal intracranial hemorrhage, subarachnoid hemorrhage, subdural hemorrhage, expressive aphasia, and seizures.  He was started on Keppra  and Eliquis  was discontinued.  His home Wellbutrin  was discontinued due to seizure.  Also had melena with positive FOBT for which GI had recommended monitoring.  He was started on midodrine  for orthostatic hypotension.  Please see discharge summary for details.  Patient presents to the ED today via EMS for evaluation of sudden onset slurred speech and mild right facial droop.  Last known well at 1600 today.  Vital signs on arrival: Temperature 98.2 F, pulse 68, respiratory rate 18, blood pressure 192/83, and SpO2 100% on room air.  Labs notable for hemoglobin 12.9 (stable), sodium 124 (previously 140 on labs 12 days ago), chloride 91, glucose 100, ethanol <15, UA pending.  CT head showing prior ICH/SAH/SDH which has improved/stable and no acute findings.  Neurology suspecting recrudescence of prior ICH/SAH versus possible focal seizure.  Recommended obtaining routine EEG and holding off brain MRI as it is unlikely to change management since the patient is not a candidate for anticoagulation with his prior ICH/SAH.  Patient was given Tylenol  and 1 L LR in the ED.  TRH called to admit.  Patient is able to give limited history.  He reports having headaches since yesterday, now improved.  Also  reporting urinary frequency.  Reporting decreased p.o. intake since his hospitalization last month.  Denies fevers, cough, shortness of breath, chest pain, nausea, vomiting, abdominal pain, or diarrhea.  Review of Systems:  Review of Systems  All other systems reviewed and are negative.   Past Medical History:  Diagnosis Date   Arthritis    CAD (coronary artery disease)    Cancer (HCC)    skin lt. arm.   Carotid artery disease    s/p left CEA 01/17/14   Chronic systolic CHF (congestive heart failure) [facility])    Concussion Summer 2012   Essential hypertension 02/26/2017   Heart disease    Hyperlipidemia    Hypothyroidism 10/15/2017   Major depressive disorder    Mitral valve regurgitation    Myocardial infarction [facility]) 1996, 2021   Obstructive sleep apnea on CPAP    Persistent atrial fibrillation (HCC)    Poor flexibility of tendon 12/01/2018   S/P mitral valve clip implantation 12/23/2019   s/p TEER with MitraClip with one XTW by Dr. Wonda   Subungual hematoma of digit of hand 12/01/2018   Subungual hematoma of right foot 12/01/2018   Type 2 diabetes mellitus without complication, without long-term current use of insulin  (HCC) 02/26/2017    Past Surgical History:  Procedure Laterality Date   ATRIAL FIBRILLATION ABLATION N/A 02/01/2020   Procedure: ATRIAL FIBRILLATION ABLATION;  Surgeon: Inocencio Soyla Lunger, MD;  Location: [facility] INVASIVE CV LAB;  Service: Cardiovascular;  Laterality: N/A;   BYPASS GRAFT  2001   CARDIAC CATHETERIZATION     CARDIOVERSION N/A 11/08/2019   Procedure: CARDIOVERSION;  Surgeon: Delford Maude BROCKS, MD;  Location: [facility] ENDOSCOPY;  Service: Cardiovascular;  Laterality: N/A;   CAROTID ENDARTERECTOMY Left 01/17/2014   CORONARY ANGIOPLASTY     CORONARY ARTERY BYPASS GRAFT     LEFT HEART CATH AND CORS/GRAFTS ANGIOGRAPHY N/A 06/26/2022   Procedure: LEFT HEART CATH AND CORS/GRAFTS ANGIOGRAPHY;  Surgeon: Burnard Debby LABOR, MD;  Location:  [facility] INVASIVE CV LAB;  Service: Cardiovascular;  Laterality: N/A;   RIGHT/LEFT HEART CATH AND CORONARY/GRAFT ANGIOGRAPHY N/A 09/09/2019   Procedure: RIGHT/LEFT HEART CATH AND CORONARY/GRAFT ANGIOGRAPHY;  Surgeon: Court Dorn PARAS, MD;  Location: [facility] INVASIVE CV LAB;  Service: Cardiovascular;  Laterality: N/A;   TEE WITHOUT CARDIOVERSION N/A 09/13/2019   Procedure: TRANSESOPHAGEAL ECHOCARDIOGRAM (TEE);  Surgeon: Lonni Slain, MD;  Location: [facility] ENDOSCOPY;  Service: Cardiovascular;  Laterality: N/A;   TEE WITHOUT CARDIOVERSION N/A 12/23/2019   Procedure: TRANSESOPHAGEAL ECHOCARDIOGRAM (TEE);  Surgeon: Wonda Sharper, MD;  Location: [facility] INVASIVE CV LAB;  Service: Cardiovascular;  Laterality: N/A;   TEE WITHOUT CARDIOVERSION  02/01/2020   Procedure: TRANSESOPHAGEAL ECHOCARDIOGRAM (TEE);  Surgeon: Inocencio Soyla Lunger, MD;  [facility] INVAS[facility] CV LAB;  Service: Cardiovascular;;   TOTAL HIP ARTHROPLASTY  1994, 1995   TOTAL HIP ARTHROPLASTY Bilateral    TRANSCATHETER MITRAL EDGE TO EDGE REPAIR N/A 12/23/2019   Procedure: MITRAL VALVE REPAIR;  Surgeon: Wonda Sharper, MD;  Location: [facility] INVASIVE CV LAB;  Service: Cardiovascular;  Laterality: N/A;   TRANSURETHRAL RESECTION OF PROSTATE N/A 12/27/2022   Procedure: TRANSURETHRAL RESECTION OF THE PROSTATE (TURP);  Surgeon: Nieves Cough, MD;  Location: [facility] ORS;  Service: Urology;  Laterality: N/A;  75 minute case     reports that he quit smoking about 46 years ago. His smoking use included cigarettes. He has never used smokeless tobacco. He reports current alcohol use of about 1.0 standard drink of alcohol per week. He reports that he does not use drugs.  Allergies[1]  Family History  Problem Relation Age of Onset   Dementia Father        Concerns surrounding Lewy body dementia, but never officially diagnosed   Cancer Neg Hx     Prior to Admission medications  Medication Sig Start Date End Date Taking? Authorizing Provider   acetaminophen  (TYLENOL ) 500 MG tablet Take 1,000 mg by mouth daily.   Yes [provider]  atorvastatin  (LIPITOR ) 80 MG tablet TAKE 1 TABLET BY MOUTH DAILY 12/04/23  Yes Chandrasekhar, Mahesh A, MD  docusate sodium  (COLACE) 100 MG capsule Take 100-200 mg by mouth at bedtime.   Yes [provider]  ezetimibe  (ZETIA ) 10 MG tablet TAKE 1 TABLET BY MOUTH DAILY 12/04/23  Yes Chandrasekhar, Mahesh A, MD  famotidine  (PEPCID ) 20 MG tablet Take 20 mg by mouth daily.   Yes [provider]  finasteride  (PROSCAR ) 5 MG tablet Take 1 tablet (5 mg total) by mouth daily. 10/16/22  Yes Shona Layman BROCKS, MD  levETIRAcetam  (KEPPRA ) 500 MG tablet Take 1 tablet (500 mg total) by mouth 2 (two) times daily. Patient taking differently: Take 250 mg by mouth See admin instructions. Patients spouse states that he was instructed to taper off of this medication. She states the patient is now on (250mg ) half of a tablet once a day 01/20/24  Yes Wendling, Mabel Mt, DO  levothyroxine  (SYNTHROID ) 75 MCG tablet TAKE 1 TABLET BY MOUTH DAILY 10/20/23  Yes Frann Mabel Mt, DO  liothyronine  (CYTOMEL ) 5 MCG tablet TAKE 2 TABLETS BY MOUTH DAILY 08/27/23  Yes Frann Mabel Mt, DO  LORazepam  (ATIVAN ) 0.5 MG tablet 1 bid Patient taking differently:  Take 0.5 mg by mouth 2 (two) times daily. 07/29/23  Yes Plovsky, Elna, MD  metoprolol  succinate (TOPROL -XL) 25 MG 24 hr tablet Take 0.5 tablets (12.5 mg total) by mouth daily. 04/30/23  Yes Frann Mabel Mt, DO  midodrine  (PROAMATINE ) 2.5 MG tablet Take 1 tablet (2.5 mg total) by mouth 2 (two) times daily with a meal. This should be adjusted by your outpatient doctor as appropriate based on your orthostatic hypotension. 01/12/24 02/11/24 Yes Perri DELENA Meliton Mickey., MD  Multiple Vitamins-Minerals (MULTIVITAMIN ADULT EXTRA C PO) Take 1 tablet by mouth daily.    Yes [provider]  nitroGLYCERIN  (NITROSTAT ) 0.4 MG SL tablet Place 1 tablet (0.4  mg total) under the tongue every 5 (five) minutes as needed for chest pain. 06/21/22  Yes Frann Mabel Mt, DO  Oxcarbazepine  (TRILEPTAL ) 300 MG tablet Take 1 tablet (300 mg total) by mouth 2 (two) times daily. 01/20/24  Yes Gregg Lek, MD  PARoxetine  (PAXIL ) 10 MG tablet TAKE 1 TABLET BY MOUTH DAILY 12/01/23  Yes Wendling, Mabel Mt, DO  polyethylene glycol powder (GLYCOLAX /MIRALAX ) 17 GM/SCOOP powder Dissolve 17 g as directed and take by mouth daily. Titrate as needed for a soft bowel movement daily 01/12/24  Yes Perri DELENA Meliton Mickey., MD  sertraline  (ZOLOFT ) 50 MG tablet Take 1 tablet (50 mg total) by mouth daily. 01/13/24  Yes Plovsky, Elna, MD  Cholecalciferol  (VITAMIN D3) 250 MCG (10000 UT) TABS Take 1 tablet by mouth daily.    [provider]  [Paused] OZEMPIC , 2 MG/DOSE, 8 MG/3ML SOPN INJECT SUBCUTANEOUSLY 2 MG EVERY WEEK ON SUNDAY AS DIRECTED Patient taking differently: Inject 2 mg into the skin once a week. Wait to take this until your doctor or other care provider tells you to start again. 08/04/23   Frann Mabel Mt, DO  UNABLE TO FIND Place into the left eye in the morning, at noon, in the evening, and at bedtime. Prednisolone  Phosphate 1% /  Moxifloxacin  0.5% / Bromfenac  0.075% Sterile Ophthalmic Solution Patient not taking: Reported on 02/01/2024    [provider]    Physical Exam: Vitals:   02/01/24 1746 02/01/24 1815 02/01/24 1931 02/01/24 2015  BP:  (!) 178/72 (!) 186/71 (!) 178/74  Pulse: 68 (!) 58 70 64  Resp:  14 20 15   Temp:      TempSrc:      SpO2: 100% 99% 95% 100%  Weight:      Height:        Physical Exam Vitals reviewed.  Constitutional:      General: He is not in acute distress. HENT:     Head: Normocephalic and atraumatic.  Eyes:     Extraocular Movements: Extraocular movements intact.  Cardiovascular:     Rate and Rhythm: Normal rate and regular rhythm.     Pulses: Normal pulses.  Pulmonary:     Effort:  Pulmonary effort is normal. No respiratory distress.     Breath sounds: Normal breath sounds. No wheezing or rales.  Abdominal:     General: Bowel sounds are normal.     Palpations: Abdomen is soft.     Tenderness: There is no abdominal tenderness. There is no guarding.  Musculoskeletal:     Cervical back: Normal range of motion.     Right lower leg: No edema.     Left lower leg: No edema.  Skin:    General: Skin is warm and dry.  Neurological:     Mental Status: He is alert and  oriented to person, place, and time.     Sensory: No sensory deficit.     Motor: No weakness.     Comments: Speech does not appear slurred at this time Mild right facial droop     Labs on Admission: I have personally reviewed following labs and imaging studies  CBC: Recent Labs  Lab 02/01/24 1743 02/01/24 1800  WBC  --  8.0  NEUTROABS  --  5.0  HGB 14.6 12.9*  HCT 43.0 39.0  MCV  --  85.5  PLT  --  177   Basic Metabolic Panel: Recent Labs  Lab 02/01/24 1743 02/01/24 1800  NA 123* 124*  K 5.5* 4.3  CL 92* 91*  CO2  --  22  GLUCOSE 99 100*  BUN 11 9  CREATININE 0.70 0.67  CALCIUM   --  8.8*   GFR: Estimated Creatinine Clearance: 72.6 mL/min (by C-G formula based on SCr of 0.67 mg/dL). Liver Function Tests: Recent Labs  Lab 02/01/24 1800  AST 30  ALT 33  ALKPHOS 90  BILITOT 0.4  PROT 6.4*  ALBUMIN  3.8   No results for input(s): LIPASE, AMYLASE in the last 168 hours. No results for input(s): AMMONIA in the last 168 hours. Coagulation Profile: Recent Labs  Lab 02/01/24 1800  INR 0.9   Cardiac Enzymes: No results for input(s): CKTOTAL, CKMB, CKMBINDEX, TROPONINI in the last 168 hours. BNP (last 3 results) No results for input(s): PROBNP in the last 8760 hours. HbA1C: No results for input(s): HGBA1C in the last 72 hours. CBG: Recent Labs  Lab 02/01/24 1729  GLUCAP 110*   Lipid Profile: No results for input(s): CHOL, HDL, LDLCALC, TRIG,  CHOLHDL, LDLDIRECT in the last 72 hours. Thyroid  Function Tests: No results for input(s): TSH, T4TOTAL, FREET4, T3FREE, THYROIDAB in the last 72 hours. Anemia Panel: No results for input(s): VITAMINB12, FOLATE, FERRITIN, TIBC, IRON, RETICCTPCT in the last 72 hours. Urine analysis:    Component Value Date/Time   COLORURINE STRAW (A) 12/24/2019 1546   APPEARANCEUR Clear 10/16/2022 0000   LABSPEC 1.005 12/24/2019 1546   PHURINE 5.0 12/24/2019 1546   GLUCOSEU Negative 10/16/2022 0000   HGBUR LARGE (A) 12/24/2019 1546   BILIRUBINUR Negative 10/16/2022 0000   KETONESUR NEGATIVE 12/24/2019 1546   PROTEINUR Trace (A) 10/16/2022 0000   PROTEINUR NEGATIVE 12/24/2019 1546   NITRITE Negative 10/16/2022 0000   NITRITE NEGATIVE 12/24/2019 1546   LEUKOCYTESUR Negative 10/16/2022 0000   LEUKOCYTESUR SMALL (A) 12/24/2019 1546    Radiological Exams on Admission: CT HEAD CODE STROKE WO CONTRAST Result Date: 02/01/2024 EXAM: CT HEAD WITHOUT CONTRAST 02/01/2024 05:39:17 PM TECHNIQUE: CT of the head was performed without the administration of intravenous contrast. Automated exposure control, iterative reconstruction, and/or weight based adjustment of the mA/kV was utilized to reduce the radiation dose to as low as reasonably achievable. COMPARISON: CT head without contrast and MR head without contrast 01/02/2024. CLINICAL HISTORY: Neuro deficit, acute, stroke suspected. FINDINGS: BRAIN AND VENTRICLES: Trace subarachnoid hemorrhage within left temporal, parietal and occipital lobes. Decreased compared to prior study. Resolving left temporal lobe parenchymal hemorrhage. Stable 2 mm left parietal subdural hematoma. Generalized cerebral atrophy is stable. Patchy white matter low-density suggestive of chronic small vessel ischemic disease, stable. No evidence of acute infarct. No hydrocephalus. No mass effect or midline shift. ORBITS: No acute abnormality. SINUSES: No acute abnormality. SOFT  TISSUES AND SKULL: No acute soft tissue abnormality. No skull fracture. IMPRESSION: 1. Trace subarachnoid hemorrhage within left temporal, parietal, and occipital lobes,  decreased compared to prior study. 2. Resolving left temporal lobe parenchymal hemorrhage. 3. Stable 2 mm left parietal subdural hematoma. 4. No new or enlarging hemorrhage. No acute infarct. Electronically signed by: Lonni Necessary MD 02/01/2024 05:44 PM EST RP Workstation: [id number]    Assessment and Plan  Acute onset slurred speech and mild right facial droop CT head showing prior ICH/SAH/SDH which has improved/stable and no acute findings.  Neurology suspecting recrudescence of prior ICH/SAH versus possible focal seizure.  Recommended obtaining routine EEG and holding off brain MRI as it is unlikely to change management since the patient is not a candidate for anticoagulation with his prior intracranial bleed.  Routine EEG and seizure precautions ordered.  Hyponatremia Likely due to poor p.o. intake.  TSH was normal on labs a month ago.  Continue IV fluid hydration with normal saline.  Check serum osmolarity and monitor sodium level every 4 hours.  Goal rate of correction 4 to 6 mEq in 24 hours.  Urinary frequency UA pending to rule out UTI.  Prior ICH/SAH/SDH History of seizures Based on chart review, patient was discharged on Keppra  1 g twice daily during his hospitalization last month but then had a follow-up visit with PCP who reduced the dose of Keppra  to 500 mg twice daily due to concern for sedation.  Patient's wife is no longer in the ED at this time but had told pharmacy tech that they have now weaned it down to 250 mg once a day.  Patient is also on oxcarbazepine  300 mg twice daily since 01/20/2024.  Discussed with neurology, recommending continuing Keppra  500 mg twice daily and oxcarbazepine  300 mg twice daily.  Paroxysmal A-fib No longer on Eliquis  due to history of intracranial bleed.  Mildly bradycardic at  this time with heart rate in the high 50s, will hold metoprolol  at this time.  CAD  Hypertension Blood pressure elevated with most recent systolic in the 170s.  Hold midodrine .  Hyperlipidemia Continue atorvastatin  and ezetimibe .  Hypothyroidism Continue levothyroxine  and liothyronine .  Type 2 diabetes Well-controlled-hemoglobin A1c 5.5 on 12/22/2023.  Placed on sensitive sliding scale insulin  [facility].  Chronic CHF Mitral regurgitation status post TEER with MitraClip in 2021 Echo done a month ago showing EF 55 to 60% and mild mitral regurgitation.  No signs of volume overload at this time.  CAD status post CABG  Depression/anxiety Continue sertraline , paroxetine , and lorazepam .  OSA Continue CPAP at night  GERD Continue Pepcid .  BPH Continue finasteride .     DVT prophylaxis: SCDs Code Status: Full Code (discussed with the patient) Family Communication: ***  Consults called: ***  Level of care: {Blank single:19197::Med-Surg,Telemetry bed,Progressive Care Unit,Step Down Unit} Admission status: *** Time Spent: 75+ minutes***  Editha Ram MD [facility]  If 7PM-7AM, please contact night-coverage [email or web address]  02/01/2024, 10:35 PM         [1] Allergies Allergen Reactions   Sulfa Antibiotics Rash    Questionable, he developed a diffuse rash 2 days after stopping Bactrim  "

## 2024-02-01 NOTE — ED Provider Notes (Signed)
 " [facility] Provider Note   CSN: [id number] Arrival date & time: 02/01/24  1726     Patient presents with: No chief complaint on file.   Raymond Christensen is a 84 y.o. male with past medical history notable for paroxysmal atrial fibrillation on Eliquis  who presents today for evaluation of sudden onset dysarthria and mild facial droop.  Patient had a recent traumatic fall resulting in a subarachnoid hemorrhage resulting in discontinuation of his Eliquis .  At that time his only presenting symptom was that dysarthria.  His symptoms had subsequently returned back to baseline.  Patient has had some decreased p.o. particularly with liquids over the past several days.  Today was normal around 4 PM and approximately 430 wife noticed that he had sudden  dysarthria with mild facial droop.  Was subsequent brought to our facility as a code stroke.  No recent falls.  HPI     Prior to Admission medications  Medication Sig Start Date End Date Taking? Authorizing Provider  acetaminophen  (TYLENOL ) 500 MG tablet Take 1,000 mg by mouth daily.    [provider]  atorvastatin  (LIPITOR ) 80 MG tablet TAKE 1 TABLET BY MOUTH DAILY 12/04/23   Chandrasekhar, Mahesh A, MD  Cholecalciferol  (VITAMIN D3) 250 MCG (10000 UT) TABS Take 1 tablet by mouth daily.    [provider]  docusate sodium  (COLACE) 100 MG capsule Take 100 mg by mouth daily as needed for mild constipation or moderate constipation.    [provider]  ezetimibe  (ZETIA ) 10 MG tablet TAKE 1 TABLET BY MOUTH DAILY 12/04/23   Chandrasekhar, Mahesh A, MD  famotidine  (PEPCID ) 20 MG tablet Take 20 mg by mouth daily.    [provider]  finasteride  (PROSCAR ) 5 MG tablet Take 1 tablet (5 mg total) by mouth daily. 10/16/22   Shona Layman BROCKS, MD  levETIRAcetam  (KEPPRA ) 500 MG tablet Take 1 tablet (500 mg total) by mouth 2 (two) times daily. 01/20/24   Frann Mabel Mt, DO   levothyroxine  (SYNTHROID ) 75 MCG tablet TAKE 1 TABLET BY MOUTH DAILY 10/20/23   Wendling, Mabel Mt, DO  liothyronine  (CYTOMEL ) 5 MCG tablet TAKE 2 TABLETS BY MOUTH DAILY 08/27/23   Frann Mabel Mt, DO  LORazepam  (ATIVAN ) 0.5 MG tablet 1 bid Patient taking differently: Take 0.5 mg by mouth daily. 07/29/23   Plovsky, Elna, MD  metoprolol  succinate (TOPROL -XL) 25 MG 24 hr tablet Take 0.5 tablets (12.5 mg total) by mouth daily. 04/30/23   Frann Mabel Mt, DO  midodrine  (PROAMATINE ) 2.5 MG tablet Take 1 tablet (2.5 mg total) by mouth 2 (two) times daily with a meal. This should be adjusted by your outpatient doctor as appropriate based on your orthostatic hypotension. 01/12/24 02/11/24  Perri DELENA Meliton Mickey., MD  Multiple Vitamins-Minerals (MULTIVITAMIN ADULT EXTRA C PO) Take 1 tablet by mouth daily.     [provider]  nitroGLYCERIN  (NITROSTAT ) 0.4 MG SL tablet Place 1 tablet (0.4 mg total) under the tongue every 5 (five) minutes as needed for chest pain. 06/21/22   Frann Mabel Mt, DO  Oxcarbazepine  (TRILEPTAL ) 300 MG tablet Take 1 tablet (300 mg total) by mouth 2 (two) times daily. 01/20/24   Camara, Amadou, MD  [Paused] OZEMPIC , 2 MG/DOSE, 8 MG/3ML SOPN INJECT SUBCUTANEOUSLY 2 MG EVERY WEEK ON SUNDAY AS DIRECTED Patient taking differently: Inject 2 mg into the skin once a week. Wait to take this until your doctor or other care provider tells you to start again.  08/04/23   Frann Mabel Mt, DO  PARoxetine  (PAXIL ) 10 MG tablet TAKE 1 TABLET BY MOUTH DAILY 12/01/23   Wendling, Mabel Mt, DO  polyethylene glycol powder (GLYCOLAX /MIRALAX ) 17 GM/SCOOP powder Dissolve 17 g as directed and take by mouth daily. Titrate as needed for a soft bowel movement daily 01/12/24   Perri DELENA Meliton Mickey., MD  sertraline  (ZOLOFT ) 50 MG tablet Take 1 tablet (50 mg total) by mouth daily. 01/13/24   Plovsky, Elna, MD  UNABLE TO FIND Place into the left eye in the morning, at  noon, in the evening, and at bedtime. Prednisolone  Phosphate 1% /  Moxifloxacin  0.5% / Bromfenac  0.075% Sterile Ophthalmic Solution    [provider]    Allergies: Sulfa antibiotics    Review of Systems  Updated Vital Signs There were no vitals taken for this visit.  Physical Exam Constitutional:      General: He is not in acute distress.    Appearance: Normal appearance.  HENT:     Head: Normocephalic.     Right Ear: External ear normal.     Left Ear: External ear normal.     Nose: Nose normal.     Mouth/Throat:     Mouth: Mucous membranes are moist.  Eyes:     Pupils: Pupils are equal, round, and reactive to light.  Cardiovascular:     Rate and Rhythm: Normal rate.     Pulses: Normal pulses.  Pulmonary:     Effort: Pulmonary effort is normal.  Abdominal:     Palpations: Abdomen is soft.  Musculoskeletal:        General: Normal range of motion.     Cervical back: Normal range of motion.  Skin:    General: Skin is warm.     Capillary Refill: Capillary refill takes less than 2 seconds.  Neurological:     General: No focal deficit present.     Mental Status: He is alert.     Cranial Nerves: No cranial nerve deficit.     Motor: No weakness.  Psychiatric:        Mood and Affect: Mood normal.     (all labs ordered are listed, but only abnormal results are displayed) Labs Reviewed  CBG MONITORING, ED - Abnormal; Notable for the following components:      Result Value   Glucose-Capillary 110 (*)    All other components within normal limits  PROTIME-INR  APTT  CBC  DIFFERENTIAL  COMPREHENSIVE METABOLIC PANEL WITH GFR  ETHANOL  I-STAT CHEM 8, ED    EKG: None  Radiology: No results found.  Procedures   Medications Ordered in the ED  sodium chloride  flush (NS) 0.9 % injection 3 mL (has no administration in time range)                                Medical Decision Making Amount and/or Complexity of Data Reviewed Labs: ordered. Radiology:  ordered.  Risk OTC drugs. Decision regarding hospitalization.   Patient is an 84 year old male who presents today for evaluation of stroke alert due to sudden onset dysarthria as well as mild facial droop.  On initial assessment patient was noted to be hypertensive but otherwise hemodynamically stable and afebrile.  Patient was seen in conjunction with neurology as a stroke alert.  At the time of our evaluation patient's speech has largely returned back with overall fluency.  No notable dysarthria.  No obvious  facial droop.  Patient was initially taken back for code stroke imaging did not show any evidence of acute abnormalities or new bleeds.  Review of laboratory evaluation notable for new hyponatremia to 124 without any significant AKI or elevated BUN.  Unremarkable CBC.  During his time in the emergency department patient did have some recurrence of mild dysarthria however speech largely remains fluent.  No need at this point in time for further MRI imaging per neurology however would recommend routine EEG to evaluate for potential seizure as underlying etiology.  I suspect that his presentation like related to mild dehydration and potential recrudescence of recent insult.  On further discussion patient is also had significant polyuria and urinary urgency without frank dysuria.  Will also obtain urinalysis for evaluation potential infection.  At this point in time due to ongoing mild symptoms as well as hyponatremia, will admit to the inpatient service for further management.   Final diagnoses:  Aphasia    ED Discharge Orders     None          Laurita Sieving, MD 02/01/24 2328  "

## 2024-02-01 NOTE — Consult Note (Signed)
 NEUROLOGY CONSULT NOTE   Date of service: February 01, 2024 Patient Name: Raymond Christensen MRN:  [id number] DOB:  12/14/1940 Chief Complaint: slurred speech Requesting Provider: Ellouise Richerd POUR, DO  History of Present Illness  Raymond Christensen is a 84 y.o. male with hx of paroxysmal atrial fibrillation on Eliquis  in the past but had a fall with traumatic subarachnoid hemorrhage and some intracranial hemorrhage and Eliquis  was discontinued.  He was brought in as a code stroke today for slurred speech and a mild right facial droop.  I spoke with his wife who reports that he got up from his nap in the afternoon and was sitting in a lounging chair and eating an orange.  He started slurring his speech.  He was interactive and there was no staring off and no twitching concerning for a seizure.  He was telling EMS that there is pain over his left eye and his head.  Upon arrival to the ED, he is pretty much back to himself.  Labs with some hyponatremia.  Stat CT head without contrast was negative for any ICH or acute abnormalities.  LKW: 1600 Modified rankin score: 3-Moderate disability-requires help but walks WITHOUT assistance IV Thrombolysis: not offered, recent ICH and SAH, resolution of his symptoms. EVT: Low suspicion for an LVO.    NIHSS components Score: Comment  1a Level of Conscious 0[]  1[]  2[]  3[]      1b LOC Questions 0[]  1[]  2[]       1c LOC Commands 0[]  1[]  2[]       2 Best Gaze 0[]  1[]  2[]       3 Visual 0[]  1[]  2[]  3[]      4 Facial Palsy 0[]  1[x]  2[]  3[]      5a Motor Arm - left 0[]  1[]  2[]  3[]  4[]  UN[]    5b Motor Arm - Right 0[]  1[]  2[]  3[]  4[]  UN[]    6a Motor Leg - Left 0[]  1[]  2[]  3[]  4[]  UN[]    6b Motor Leg - Right 0[]  1[]  2[]  3[]  4[]  UN[]    7 Limb Ataxia 0[]  1[]  2[]  UN[]      8 Sensory 0[]  1[]  2[]  UN[]      9 Best Language 0[]  1[]  2[]  3[]      10 Dysarthria 0[]  1[]  2[]  UN[]      11 Extinct. and Inattention 0[]  1[]  2[]       TOTAL: 1      ROS  Comprehensive ROS  performed and pertinent positives documented in HPI   Past History   Past Medical History:  Diagnosis Date   Arthritis    CAD (coronary artery disease)    Cancer (HCC)    skin lt. arm.   Carotid artery disease    s/p left CEA 01/17/14   Chronic systolic CHF (congestive heart failure) [facility])    Concussion Summer 2012   Essential hypertension 02/26/2017   Heart disease    Hyperlipidemia    Hypothyroidism 10/15/2017   Major depressive disorder    Mitral valve regurgitation    Myocardial infarction (HCC) 1996, 2021   Obstructive sleep apnea on CPAP    Persistent atrial fibrillation (HCC)    Poor flexibility of tendon 12/01/2018   S/P mitral valve clip implantation 12/23/2019   s/p TEER with MitraClip with one XTW by Dr. Wonda   Subungual hematoma of digit of hand 12/01/2018   Subungual hematoma of right foot 12/01/2018   Type 2 diabetes mellitus without complication, without long-term current use of insulin  (HCC) 02/26/2017    Past Surgical History:  Procedure  Laterality Date   ATRIAL FIBRILLATION ABLATION N/A 02/01/2020   Procedure: ATRIAL FIBRILLATION ABLATION;  Surgeon: Inocencio Soyla Lunger, MD;  Location: [facility] INVASIVE CV LAB;  Service: Cardiovascular;  Laterality: N/A;   BYPASS GRAFT  2001   CARDIAC CATHETERIZATION     CARDIOVERSION N/A 11/08/2019   Procedure: CARDIOVERSION;  Surgeon: Delford Maude BROCKS, MD;  Location: [facility] ENDOSCOPY;  Service: Cardiovascular;  Laterality: N/A;   CAROTID ENDARTERECTOMY Left 01/17/2014   CORONARY ANGIOPLASTY     CORONARY ARTERY BYPASS GRAFT     LEFT HEART CATH AND CORS/GRAFTS ANGIOGRAPHY N/A 06/26/2022   Procedure: LEFT HEART CATH AND CORS/GRAFTS ANGIOGRAPHY;  Surgeon: Burnard Debby LABOR, MD;  Location: [facility] INVASIVE CV LAB;  Service: Cardiovascular;  Laterality: N/A;   RIGHT/LEFT HEART CATH AND CORONARY/GRAFT ANGIOGRAPHY N/A 09/09/2019   Procedure: RIGHT/LEFT HEART CATH AND CORONARY/GRAFT ANGIOGRAPHY;  Surgeon: Court Dorn PARAS, MD;  Location: [facility]  INVASIVE CV LAB;  Service: Cardiovascular;  Laterality: N/A;   TEE WITHOUT CARDIOVERSION N/A 09/13/2019   Procedure: TRANSESOPHAGEAL ECHOCARDIOGRAM (TEE);  Surgeon: Lonni Slain, MD;  Location: [facility] ENDOSCOPY;  Service: Cardiovascular;  Laterality: N/A;   TEE WITHOUT CARDIOVERSION N/A 12/23/2019   Procedure: TRANSESOPHAGEAL ECHOCARDIOGRAM (TEE);  Surgeon: Wonda Sharper, MD;  Location: [facility] INVASIVE CV LAB;  Service: Cardiovascular;  Laterality: N/A;   TEE WITHOUT CARDIOVERSION  02/01/2020   Procedure: TRANSESOPHAGEAL ECHOCARDIOGRAM (TEE);  Surgeon: Inocencio Soyla Lunger, MD;  Location: [facility] INVASIVE CV LAB;  Service: Cardiovascular;;   TOTAL HIP ARTHROPLASTY  1994, 1995   TOTAL HIP ARTHROPLASTY Bilateral    TRANSCATHETER MITRAL EDGE TO EDGE REPAIR N/A 12/23/2019   Procedure: MITRAL VALVE REPAIR;  Surgeon: Wonda Sharper, MD;  Location: [facility] INVASIVE CV LAB;  Service: Cardiovascular;  Laterality: N/A;   TRANSURETHRAL RESECTION OF PROSTATE N/A 12/27/2022   Procedure: TRANSURETHRAL RESECTION OF THE PROSTATE (TURP);  Surgeon: Nieves Cough, MD;  Location: [facility] ORS;  Service: Urology;  Laterality: N/A;  75 minute case    Family History: Family History  Problem Relation Age of Onset   Dementia Father        Concerns surrounding Lewy body dementia, but never officially diagnosed   Cancer Neg Hx     Social History  reports that he quit smoking about 46 years ago. His smoking use included cigarettes. He has never used smokeless tobacco. He reports current alcohol use of about 1.0 standard drink of alcohol per week. He reports that he does not use drugs.  Allergies[1]  Medications  Current Medications[2]  Vitals   Vitals:   02/01/24 1733 02/01/24 1744 02/01/24 1746  BP:  (!) 192/83   Pulse:   68  Resp:  18   Temp:  98.2 F (36.8 C)   TempSrc:  Oral   SpO2:   100%  Weight: 95.8 kg 79.6 kg   Height:  5' 9.6 (1.768 m)     Body mass index is 25.46 kg/m.   Physical Exam    General: Laying comfortably in bed; in no acute distress.  HENT: Normal oropharynx and mucosa. Normal external appearance of ears and nose.  Neck: Supple, no pain or tenderness  CV: No JVD. No peripheral edema.  Pulmonary: Symmetric Chest rise. Normal respiratory effort.  Abdomen: Soft to touch, non-tender.  Ext: No cyanosis, edema, or deformity  Skin: No rash. Normal palpation of skin.   Musculoskeletal: Normal digits and nails by inspection. No clubbing.   Neurologic Examination  Mental status/Cognition: Alert, oriented to self, place, month and year, good attention.  Speech/language: Fluent, comprehension intact, object naming intact, repetition intact.  Cranial nerves:   CN II Pupils equal and reactive to light, no VF deficits    CN III,IV,VI EOM intact, no gaze preference or deviation, no nystagmus    CN V normal sensation in V1, V2, and V3 segments bilaterally    CN VII Mild right facial droop   CN VIII normal hearing to speech    CN IX & X normal palatal elevation, no uvular deviation    CN XI 5/5 head turn and 5/5 shoulder shrug bilaterally    CN XII midline tongue protrusion    Motor:  Muscle bulk: normal, tone normal, pronator drift none tremor none Mvmt Root Nerve  Muscle Right Left Comments  SA C5/6 Ax Deltoid 5 5   EF C5/6 Mc Biceps 5 5   EE C6/7/8 Rad Triceps 5 5   WF C6/7 Med FCR     WE C7/8 PIN ECU     F Ab C8/T1 U ADM/FDI 5 5   HF L1/2/3 Fem Illopsoas 5 5   KE L2/3/4 Fem Quad 5 5   DF L4/5 D Peron Tib Ant 5 5   PF S1/2 Tibial Grc/Sol 5 5    Sensation:  Light touch Intact throughout   Pin prick    Temperature    Vibration   Proprioception    Coordination/Complex Motor:  - Finger to Nose intact bilaterally - Heel to shin intact bilaterally - Rapid alternating movement are normal - Gait: deferred gait for patient safety.   Labs/Imaging/Neurodiagnostic studies   CBC:  Recent Labs  Lab February 29, 2024 1743  HGB 14.6  HCT 43.0   Basic Metabolic Panel:   Lab Results  Component Value Date   NA 123 (L) 2024-02-29   Christensen 5.5 (H) 02-29-2024   CO2 28 01/20/2024   GLUCOSE 99 02/29/24   BUN 11 02-29-24   CREATININE 0.70 02-29-2024   CALCIUM  8.8 01/20/2024   GFRNONAA >60 01/04/2024   GFRAA 49 (L) 01/13/2020   Lipid Panel:  Lab Results  Component Value Date   LDLCALC 48 12/22/2023   HgbA1c:  Lab Results  Component Value Date   HGBA1C 5.5 12/22/2023   Urine Drug Screen:     Component Value Date/Time   LABOPIA NONE DETECTED 12/26/2023 1221   COCAINSCRNUR NONE DETECTED 12/26/2023 1221   LABBENZ NONE DETECTED 12/26/2023 1221   AMPHETMU NONE DETECTED 12/26/2023 1221   THCU NONE DETECTED 12/26/2023 1221   LABBARB NONE DETECTED 12/26/2023 1221    Alcohol Level     Component Value Date/Time   ETH <15 12/26/2023 1035   INR  Lab Results  Component Value Date   INR 1.3 (H) 12/26/2023   APTT  Lab Results  Component Value Date   APTT 39 (H) 12/26/2023   AED levels: No results found for: PHENYTOIN, ZONISAMIDE, LAMOTRIGINE, LEVETIRACETA  CT Head without contrast(Personally reviewed): CTH was negative for a large hypodensity concerning for a large territory infarct or hyperdensity concerning for an ICH  rEEG:  pending  ASSESSMENT   Krishna Dancel is a 84 y.o. male ith hx of paroxysmal atrial fibrillation on Eliquis  in the past but had a fall with traumatic subarachnoid hemorrhage and some intracranial hemorrhage and Eliquis  was discontinued.  He was brought in as a code stroke today for slurred speech and a mild right facial droop.  He still has a mild R facial droop but no slurred speech.  Does seem to have electrolyte abnormalities on iSTAT.  Suspect recrudescenc of prior ICH/SAH vs ?focal seizure.  RECOMMENDATIONS  - rEEG if being admitted, otherwise, can be done outpatient. - follow up with neurology outpatient. - MRI is unlikely to change management since he is not a candidate for anticoagulation with his  prior ICH/SAH. ______________________________________________________________________  Plan discussed with Dr. Ellouise with the ED team.  Signed, Meka Lewan, MD [facility]     [1]  Allergies Allergen Reactions   Sulfa Antibiotics Rash    Questionable, he developed a diffuse rash 2 days after stopping Bactrim  [2]  Current Facility-Administered Medications:    sodium chloride  flush (NS) 0.9 % injection 3 mL, 3 mL, Intravenous, Once, Raymond Christensen, Raymond K, DO  Current Outpatient Medications:    acetaminophen  (TYLENOL ) 500 MG tablet, Take 1,000 mg by mouth daily., Disp: , Rfl:    atorvastatin  (LIPITOR ) 80 MG tablet, TAKE 1 TABLET BY MOUTH DAILY, Disp: 90 tablet, Rfl: 3   Cholecalciferol  (VITAMIN D3) 250 MCG (10000 UT) TABS, Take 1 tablet by mouth daily., Disp: , Rfl:    docusate sodium  (COLACE) 100 MG capsule, Take 100 mg by mouth daily as needed for mild constipation or moderate constipation., Disp: , Rfl:    ezetimibe  (ZETIA ) 10 MG tablet, TAKE 1 TABLET BY MOUTH DAILY, Disp: 90 tablet, Rfl: 3   famotidine  (PEPCID ) 20 MG tablet, Take 20 mg by mouth daily., Disp: , Rfl:    finasteride  (PROSCAR ) 5 MG tablet, Take 1 tablet (5 mg total) by mouth daily., Disp: 90 tablet, Rfl: 3   levETIRAcetam  (KEPPRA ) 500 MG tablet, Take 1 tablet (500 mg total) by mouth 2 (two) times daily., Disp: 180 tablet, Rfl: 1   levothyroxine  (SYNTHROID ) 75 MCG tablet, TAKE 1 TABLET BY MOUTH DAILY, Disp: 90 tablet, Rfl: 1   liothyronine  (CYTOMEL ) 5 MCG tablet, TAKE 2 TABLETS BY MOUTH DAILY, Disp: 180 tablet, Rfl: 1   LORazepam  (ATIVAN ) 0.5 MG tablet, 1 bid (Patient taking differently: Take 0.5 mg by mouth daily.), Disp: 60 tablet, Rfl: 4   metoprolol  succinate (TOPROL -XL) 25 MG 24 hr tablet, Take 0.5 tablets (12.5 mg total) by mouth daily., Disp: 45 tablet, Rfl: 3   midodrine  (PROAMATINE ) 2.5 MG tablet, Take 1 tablet (2.5 mg total) by mouth 2 (two) times daily with a meal. This should be adjusted  by your outpatient doctor as appropriate based on your orthostatic hypotension., Disp: 60 tablet, Rfl: 0   Multiple Vitamins-Minerals (MULTIVITAMIN ADULT EXTRA C PO), Take 1 tablet by mouth daily. , Disp: , Rfl:    nitroGLYCERIN  (NITROSTAT ) 0.4 MG SL tablet, Place 1 tablet (0.4 mg total) under the tongue every 5 (five) minutes as needed for chest pain., Disp: 30 tablet, Rfl: 12   Oxcarbazepine  (TRILEPTAL ) 300 MG tablet, Take 1 tablet (300 mg total) by mouth 2 (two) times daily., Disp: 60 tablet, Rfl: 3   [Paused] OZEMPIC , 2 MG/DOSE, 8 MG/3ML SOPN, INJECT SUBCUTANEOUSLY 2 MG EVERY WEEK ON SUNDAY AS DIRECTED (Patient taking differently: Inject 2 mg into the skin once a week.), Disp: 9 mL, Rfl: 3   PARoxetine  (PAXIL ) 10 MG tablet, TAKE 1 TABLET BY MOUTH DAILY, Disp: 90 tablet, Rfl: 0   polyethylene glycol powder (GLYCOLAX /MIRALAX ) 17 GM/SCOOP powder, Dissolve 17 g as directed and take by mouth daily. Titrate as needed for a soft bowel movement daily, Disp: 238 g, Rfl: 0   sertraline  (ZOLOFT ) 50 MG tablet, Take 1 tablet (50 mg total) by mouth daily., Disp: 30 tablet, Rfl: 5   UNABLE TO FIND, Place  into the left eye in the morning, at noon, in the evening, and at bedtime. Prednisolone  Phosphate 1% /  Moxifloxacin  0.5% / Bromfenac  0.075% Sterile Ophthalmic Solution, Disp: , Rfl:

## 2024-02-01 NOTE — ED Triage Notes (Signed)
 BIB EMS from [place] at [facility] r/t sudden slurred speech that started at 1630 today. LKW 1600 02/01/2024. Endorsing right eye pain. A&Ox4. Denies blood thinners.

## 2024-02-01 NOTE — H&P (Incomplete)
 " History and Physical    Raymond Christensen FMW:[id number] DOB: 10-May-1940 DOA: 02/01/2024  PCP: Raymond Mabel Mt, DO  Chief Complaint: Slurred speech  HPI: Raymond Christensen is a 84 y.o. male with medical history significant of CAD status post CABG, carotid artery disease status post left CEA in 2016, CHF, mitral regurgitation status post TEER with MitraClip in 2021, A-fib, hypertension hyperlipidemia, hypothyroidism, type 2 diabetes, depression, anxiety, OSA on CPAP, GERD, BPH, overactive bladder.  He was admitted to the hospital last month 12/12-12/29 for traumatic left temporal intracranial hemorrhage, subarachnoid hemorrhage, subdural hemorrhage, expressive aphasia, and seizures.  He was started on Keppra  and Eliquis  was discontinued.  His home Wellbutrin  was discontinued due to seizure.  Also had melena with positive FOBT for which GI had recommended monitoring.  He was started on midodrine  for orthostatic hypotension.  Please see discharge summary for details.  Patient presents to the ED today via EMS for evaluation of sudden onset slurred speech and mild right facial droop.  Last known well at 1600 today.  Vital signs on arrival: Temperature 98.2 F, pulse 68, respiratory rate 18, blood pressure 192/83, and SpO2 100% on room air.  Labs notable for hemoglobin 12.9 (stable), sodium 124 (previously 140 on labs 12 days ago), chloride 91, glucose 100, ethanol <15, UA pending.  CT head showing prior ICH/SAH/SDH which has improved/stable and no acute findings.  Neurology suspecting recrudescence of prior ICH/SAH versus possible focal seizure.  Recommended obtaining routine EEG and holding off brain MRI as it is unlikely to change management since the patient is not a candidate for anticoagulation with his prior ICH/SAH.  Patient was given Tylenol  and 1 L LR in the ED.  TRH called to admit.  Patient is able to give limited history.  He reports having headaches since yesterday, now improved.  Also  reporting urinary frequency.  Reporting decreased p.o. intake since his hospitalization last month.  Denies fevers, cough, shortness of breath, chest pain, nausea, vomiting, abdominal pain, or diarrhea.  Review of Systems:  Review of Systems  All other systems reviewed and are negative.   Past Medical History:  Diagnosis Date   Arthritis    CAD (coronary artery disease)    Cancer (HCC)    skin lt. arm.   Carotid artery disease    s/p left CEA 01/17/14   Chronic systolic CHF (congestive heart failure) [facility])    Concussion Summer 2012   Essential hypertension 02/26/2017   Heart disease    Hyperlipidemia    Hypothyroidism 10/15/2017   Major depressive disorder    Mitral valve regurgitation    Myocardial infarction [facility]) 1996, 2021   Obstructive sleep apnea on CPAP    Persistent atrial fibrillation (HCC)    Poor flexibility of tendon 12/01/2018   S/P mitral valve clip implantation 12/23/2019   s/p TEER with MitraClip with one XTW by Dr. Wonda   Subungual hematoma of digit of hand 12/01/2018   Subungual hematoma of right foot 12/01/2018   Type 2 diabetes mellitus without complication, without long-term current use of insulin  (HCC) 02/26/2017    Past Surgical History:  Procedure Laterality Date   ATRIAL FIBRILLATION ABLATION N/A 02/01/2020   Procedure: ATRIAL FIBRILLATION ABLATION;  Surgeon: Inocencio Soyla Lunger, MD;  Location: [facility] INVASIVE CV LAB;  Service: Cardiovascular;  Laterality: N/A;   BYPASS GRAFT  2001   CARDIAC CATHETERIZATION     CARDIOVERSION N/A 11/08/2019   Procedure: CARDIOVERSION;  Surgeon: Delford Maude BROCKS, MD;  Location: [facility] ENDOSCOPY;  Service: Cardiovascular;  Laterality: N/A;   CAROTID ENDARTERECTOMY Left 01/17/2014   CORONARY ANGIOPLASTY     CORONARY ARTERY BYPASS GRAFT     LEFT HEART CATH AND CORS/GRAFTS ANGIOGRAPHY N/A 06/26/2022   Procedure: LEFT HEART CATH AND CORS/GRAFTS ANGIOGRAPHY;  Surgeon: Burnard Debby LABOR, MD;  Location: [facility] INVASIVE CV LAB;  Service:  Cardiovascular;  Laterality: N/A;   RIGHT/LEFT HEART CATH AND CORONARY/GRAFT ANGIOGRAPHY N/A 09/09/2019   Procedure: RIGHT/LEFT HEART CATH AND CORONARY/GRAFT ANGIOGRAPHY;  Surgeon: Court Dorn PARAS, MD;  Location: [facility] INVASIVE CV LAB;  Service: Cardiovascular;  Laterality: N/A;   TEE WITHOUT CARDIOVERSION N/A 09/13/2019   Procedure: TRANSESOPHAGEAL ECHOCARDIOGRAM (TEE);  Surgeon: Lonni Slain, MD;  Location: [facility] ENDOSCOPY;  Service: Cardiovascular;  Laterality: N/A;   TEE WITHOUT CARDIOVERSION N/A 12/23/2019   Procedure: TRANSESOPHAGEAL ECHOCARDIOGRAM (TEE);  Surgeon: Wonda Sharper, MD;  Location: [facility] INVASIVE CV LAB;  Service: Cardiovascular;  Laterality: N/A;   TEE WITHOUT CARDIOVERSION  02/01/2020   Procedure: TRANSESOPHAGEAL ECHOCARDIOGRAM (TEE);  Surgeon: Inocencio Soyla Lunger, MD;  Location: [facility] INVASIVE CV LAB;  Service: Cardiovascular;;   TOTAL HIP ARTHROPLASTY  1994, 1995   TOTAL HIP ARTHROPLASTY Bilateral    TRANSCATHETER MITRAL EDGE TO EDGE REPAIR N/A 12/23/2019   Procedure: MITRAL VALVE REPAIR;  Surgeon: Wonda Sharper, MD;  Location: [facility] INVASIVE CV LAB;  Service: Cardiovascular;  Laterality: N/A;   TRANSURETHRAL RESECTION OF PROSTATE N/A 12/27/2022   Procedure: TRANSURETHRAL RESECTION OF THE PROSTATE (TURP);  Surgeon: Nieves Cough, MD;  Location: [facility] ORS;  Service: Urology;  Laterality: N/A;  75 minute case     reports that he quit smoking about 46 years ago. His smoking use included cigarettes. He has never used smokeless tobacco. He reports current alcohol use of about 1.0 standard drink of alcohol per week. He reports that he does not use drugs.  Allergies[1]  Family History  Problem Relation Age of Onset   Dementia Father        Concerns surrounding Lewy body dementia, but never officially diagnosed   Cancer Neg Hx     Prior to Admission medications  Medication Sig Start Date End Date Taking? Authorizing Provider  acetaminophen  (TYLENOL ) 500 MG tablet Take  1,000 mg by mouth daily.   Yes [provider]  atorvastatin  (LIPITOR ) 80 MG tablet TAKE 1 TABLET BY MOUTH DAILY 12/04/23  Yes Chandrasekhar, Mahesh A, MD  docusate sodium  (COLACE) 100 MG capsule Take 100-200 mg by mouth at bedtime.   Yes [provider]  ezetimibe  (ZETIA ) 10 MG tablet TAKE 1 TABLET BY MOUTH DAILY 12/04/23  Yes Chandrasekhar, Mahesh A, MD  famotidine  (PEPCID ) 20 MG tablet Take 20 mg by mouth daily.   Yes [provider]  finasteride  (PROSCAR ) 5 MG tablet Take 1 tablet (5 mg total) by mouth daily. 10/16/22  Yes Shona Layman BROCKS, MD  levETIRAcetam  (KEPPRA ) 500 MG tablet Take 1 tablet (500 mg total) by mouth 2 (two) times daily. Patient taking differently: Take 250 mg by mouth See admin instructions. Patients spouse states that he was instructed to taper off of this medication. She states the patient is now on (250mg ) half of a tablet once a day 01/20/24  Yes Wendling, Mabel Mt, DO  levothyroxine  (SYNTHROID ) 75 MCG tablet TAKE 1 TABLET BY MOUTH DAILY 10/20/23  Yes Raymond Mabel Mt, DO  liothyronine  (CYTOMEL ) 5 MCG tablet TAKE 2 TABLETS BY MOUTH DAILY 08/27/23  Yes Raymond Mabel Mt, DO  LORazepam  (ATIVAN ) 0.5 MG tablet 1 bid Patient taking differently:  Take 0.5 mg by mouth 2 (two) times daily. 07/29/23  Yes Plovsky, Elna, MD  metoprolol  succinate (TOPROL -XL) 25 MG 24 hr tablet Take 0.5 tablets (12.5 mg total) by mouth daily. 04/30/23  Yes Raymond Mabel Mt, DO  midodrine  (PROAMATINE ) 2.5 MG tablet Take 1 tablet (2.5 mg total) by mouth 2 (two) times daily with a meal. This should be adjusted by your outpatient doctor as appropriate based on your orthostatic hypotension. 01/12/24 02/11/24 Yes Perri DELENA Meliton Mickey., MD  Multiple Vitamins-Minerals (MULTIVITAMIN ADULT EXTRA C PO) Take 1 tablet by mouth daily.    Yes [provider]  nitroGLYCERIN  (NITROSTAT ) 0.4 MG SL tablet Place 1 tablet (0.4 mg total) under the tongue every 5 (five)  minutes as needed for chest pain. 06/21/22  Yes Raymond Mabel Mt, DO  Oxcarbazepine  (TRILEPTAL ) 300 MG tablet Take 1 tablet (300 mg total) by mouth 2 (two) times daily. 01/20/24  Yes Camara, Amadou, MD  PARoxetine  (PAXIL ) 10 MG tablet TAKE 1 TABLET BY MOUTH DAILY 12/01/23  Yes Wendling, Mabel Mt, DO  polyethylene glycol powder (GLYCOLAX /MIRALAX ) 17 GM/SCOOP powder Dissolve 17 g as directed and take by mouth daily. Titrate as needed for a soft bowel movement daily 01/12/24  Yes Perri DELENA Meliton Mickey., MD  sertraline  (ZOLOFT ) 50 MG tablet Take 1 tablet (50 mg total) by mouth daily. 01/13/24  Yes Plovsky, Elna, MD  Cholecalciferol  (VITAMIN D3) 250 MCG (10000 UT) TABS Take 1 tablet by mouth daily.    [provider]  [Paused] OZEMPIC , 2 MG/DOSE, 8 MG/3ML SOPN INJECT SUBCUTANEOUSLY 2 MG EVERY WEEK ON SUNDAY AS DIRECTED Patient taking differently: Inject 2 mg into the skin once a week. Wait to take this until your doctor or other care provider tells you to start again. 08/04/23   Raymond Mabel Mt, DO  UNABLE TO FIND Place into the left eye in the morning, at noon, in the evening, and at bedtime. Prednisolone  Phosphate 1% /  Moxifloxacin  0.5% / Bromfenac  0.075% Sterile Ophthalmic Solution Patient not taking: Reported on 02/01/2024    [provider]    Physical Exam: Vitals:   02/01/24 1746 02/01/24 1815 02/01/24 1931 02/01/24 2015  BP:  (!) 178/72 (!) 186/71 (!) 178/74  Pulse: 68 (!) 58 70 64  Resp:  14 20 15   Temp:      TempSrc:      SpO2: 100% 99% 95% 100%  Weight:      Height:        Physical Exam Vitals reviewed.  Constitutional:      General: He is not in acute distress. HENT:     Head: Normocephalic and atraumatic.  Eyes:     Extraocular Movements: Extraocular movements intact.  Cardiovascular:     Rate and Rhythm: Normal rate and regular rhythm.     Pulses: Normal pulses.  Pulmonary:     Effort: Pulmonary effort is normal. No respiratory  distress.     Breath sounds: Normal breath sounds. No wheezing or rales.  Abdominal:     General: Bowel sounds are normal.     Palpations: Abdomen is soft.     Tenderness: There is no abdominal tenderness. There is no guarding.  Musculoskeletal:     Cervical back: Normal range of motion.     Right lower leg: No edema.     Left lower leg: No edema.  Skin:    General: Skin is warm and dry.  Neurological:     Mental Status: He is alert and  oriented to person, place, and time.     Sensory: No sensory deficit.     Motor: No weakness.     Comments: Speech does not appear slurred at this time Mild right facial droop     Labs on Admission: I have personally reviewed following labs and imaging studies  CBC: Recent Labs  Lab 02/01/24 1743 02/01/24 1800  WBC  --  8.0  NEUTROABS  --  5.0  HGB 14.6 12.9*  HCT 43.0 39.0  MCV  --  85.5  PLT  --  177   Basic Metabolic Panel: Recent Labs  Lab 02/01/24 1743 02/01/24 1800  NA 123* 124*  K 5.5* 4.3  CL 92* 91*  CO2  --  22  GLUCOSE 99 100*  BUN 11 9  CREATININE 0.70 0.67  CALCIUM   --  8.8*   GFR: Estimated Creatinine Clearance: 72.6 mL/min (by C-G formula based on SCr of 0.67 mg/dL). Liver Function Tests: Recent Labs  Lab 02/01/24 1800  AST 30  ALT 33  ALKPHOS 90  BILITOT 0.4  PROT 6.4*  ALBUMIN  3.8   No results for input(s): LIPASE, AMYLASE in the last 168 hours. No results for input(s): AMMONIA in the last 168 hours. Coagulation Profile: Recent Labs  Lab 02/01/24 1800  INR 0.9   Cardiac Enzymes: No results for input(s): CKTOTAL, CKMB, CKMBINDEX, TROPONINI in the last 168 hours. BNP (last 3 results) No results for input(s): PROBNP in the last 8760 hours. HbA1C: No results for input(s): HGBA1C in the last 72 hours. CBG: Recent Labs  Lab 02/01/24 1729  GLUCAP 110*   Lipid Profile: No results for input(s): CHOL, HDL, LDLCALC, TRIG, CHOLHDL, LDLDIRECT in the last 72  hours. Thyroid  Function Tests: No results for input(s): TSH, T4TOTAL, FREET4, T3FREE, THYROIDAB in the last 72 hours. Anemia Panel: No results for input(s): VITAMINB12, FOLATE, FERRITIN, TIBC, IRON, RETICCTPCT in the last 72 hours. Urine analysis:    Component Value Date/Time   COLORURINE STRAW (A) 12/24/2019 1546   APPEARANCEUR Clear 10/16/2022 0000   LABSPEC 1.005 12/24/2019 1546   PHURINE 5.0 12/24/2019 1546   GLUCOSEU Negative 10/16/2022 0000   HGBUR LARGE (A) 12/24/2019 1546   BILIRUBINUR Negative 10/16/2022 0000   KETONESUR NEGATIVE 12/24/2019 1546   PROTEINUR Trace (A) 10/16/2022 0000   PROTEINUR NEGATIVE 12/24/2019 1546   NITRITE Negative 10/16/2022 0000   NITRITE NEGATIVE 12/24/2019 1546   LEUKOCYTESUR Negative 10/16/2022 0000   LEUKOCYTESUR SMALL (A) 12/24/2019 1546    Radiological Exams on Admission: CT HEAD CODE STROKE WO CONTRAST Result Date: 02/01/2024 EXAM: CT HEAD WITHOUT CONTRAST 02/01/2024 05:39:17 PM TECHNIQUE: CT of the head was performed without the administration of intravenous contrast. Automated exposure control, iterative reconstruction, and/or weight based adjustment of the mA/kV was utilized to reduce the radiation dose to as low as reasonably achievable. COMPARISON: CT head without contrast and MR head without contrast 01/02/2024. CLINICAL HISTORY: Neuro deficit, acute, stroke suspected. FINDINGS: BRAIN AND VENTRICLES: Trace subarachnoid hemorrhage within left temporal, parietal and occipital lobes. Decreased compared to prior study. Resolving left temporal lobe parenchymal hemorrhage. Stable 2 mm left parietal subdural hematoma. Generalized cerebral atrophy is stable. Patchy white matter low-density suggestive of chronic small vessel ischemic disease, stable. No evidence of acute infarct. No hydrocephalus. No mass effect or midline shift. ORBITS: No acute abnormality. SINUSES: No acute abnormality. SOFT TISSUES AND SKULL: No acute soft  tissue abnormality. No skull fracture. IMPRESSION: 1. Trace subarachnoid hemorrhage within left temporal, parietal, and occipital lobes,  decreased compared to prior study. 2. Resolving left temporal lobe parenchymal hemorrhage. 3. Stable 2 mm left parietal subdural hematoma. 4. No new or enlarging hemorrhage. No acute infarct. Electronically signed by: Lonni Necessary MD 02/01/2024 05:44 PM EST RP Workstation: [id number]    Assessment and Plan  Acute onset slurred speech and mild right facial droop CT head showing prior ICH/SAH/SDH which has improved/stable and no acute findings.  Neurology suspecting recrudescence of prior ICH/SAH versus possible focal seizure.  Recommended obtaining routine EEG and holding off brain MRI as it is unlikely to change management since the patient is not a candidate for anticoagulation with his prior intracranial bleed.  Routine EEG and seizure precautions ordered.  Hyponatremia Likely due to poor p.o. intake.  TSH was normal on labs a month ago.  Continue IV fluid hydration with normal saline.  Check serum osmolarity and monitor sodium level every 4 hours.  Goal rate of correction 4 to 6 mEq in 24 hours.  Urinary frequency UA pending to rule out UTI.  Prior ICH/SAH/SDH History of seizures Based on chart review, patient was discharged on Keppra  1 g twice daily during his hospitalization last month but then had a follow-up visit with PCP who reduced the dose of Keppra  to 500 mg twice daily due to concern for sedation.  Patient's wife is no longer in the ED at this time but had told pharmacy tech that they have now weaned Keppra  down to 250 mg once a day.  Patient is also on Trileptal  300 mg twice daily since 01/20/2024.  I have discussed with neurology and will continue Keppra  500 mg twice daily and Trileptal  300 mg twice daily for now.  Paroxysmal A-fib No longer on Eliquis  due to history of intracranial bleed.  Mildly bradycardic at this time with heart rate in  the high 50s, will hold metoprolol  at this time.  Addendum: EKG ordered.  CAD Not endorsing any anginal symptoms.  Hypertension Blood pressure elevated with most recent systolic in the 170s.  Hold midodrine .  Metoprolol  currently being held due to mild bradycardia.  IV hydralazine  PRN SBP >160.  Hyperlipidemia Continue atorvastatin  and ezetimibe .  Hypothyroidism Continue levothyroxine  and liothyronine .  Type 2 diabetes Well-controlled-hemoglobin A1c 5.5 on 12/22/2023.  Placed on sensitive sliding scale insulin  ACHS.  Chronic CHF Mitral regurgitation status post TEER with MitraClip in 2021 Echo done a month ago showing EF 55 to 60% and mild mitral regurgitation.  No signs of volume overload at this time.  Continue to monitor volume status very closely.  Depression/anxiety Continue sertraline , paroxetine , and lorazepam .  OSA Continue CPAP at night  GERD Continue Pepcid .  BPH Continue finasteride .  DVT prophylaxis: SCDs Code Status: Full Code (discussed with the patient) Family Communication: No family available at this time. Level of care: Progressive Care Unit Admission status: It is my clinical opinion that referral for OBSERVATION is reasonable and necessary in this patient based on the above information provided. The aforementioned taken together are felt to place the patient at high risk for further clinical deterioration. However, it is anticipated that the patient may be medically stable for discharge from the hospital within 24 to 48 hours.  Editha Ram MD [facility]  If 7PM-7AM, please contact night-coverage [email or web address]  02/01/2024, 10:35 PM       [1]  Allergies Allergen Reactions   Sulfa Antibiotics Rash    Questionable, he developed a diffuse rash 2 days after stopping Bactrim   "

## 2024-02-02 ENCOUNTER — Observation Stay (HOSPITAL_COMMUNITY)

## 2024-02-02 DIAGNOSIS — N401 Enlarged prostate with lower urinary tract symptoms: Secondary | ICD-10-CM | POA: Diagnosis present

## 2024-02-02 DIAGNOSIS — F419 Anxiety disorder, unspecified: Secondary | ICD-10-CM | POA: Diagnosis present

## 2024-02-02 DIAGNOSIS — Z7901 Long term (current) use of anticoagulants: Secondary | ICD-10-CM | POA: Diagnosis not present

## 2024-02-02 DIAGNOSIS — Z7985 Long-term (current) use of injectable non-insulin antidiabetic drugs: Secondary | ICD-10-CM | POA: Diagnosis not present

## 2024-02-02 DIAGNOSIS — Z79899 Other long term (current) drug therapy: Secondary | ICD-10-CM | POA: Diagnosis not present

## 2024-02-02 DIAGNOSIS — T421X5A Adverse effect of iminostilbenes, initial encounter: Secondary | ICD-10-CM | POA: Diagnosis not present

## 2024-02-02 DIAGNOSIS — F32A Depression, unspecified: Secondary | ICD-10-CM | POA: Diagnosis present

## 2024-02-02 DIAGNOSIS — E119 Type 2 diabetes mellitus without complications: Secondary | ICD-10-CM | POA: Diagnosis present

## 2024-02-02 DIAGNOSIS — E860 Dehydration: Secondary | ICD-10-CM | POA: Diagnosis present

## 2024-02-02 DIAGNOSIS — R4781 Slurred speech: Secondary | ICD-10-CM | POA: Diagnosis present

## 2024-02-02 DIAGNOSIS — I2510 Atherosclerotic heart disease of native coronary artery without angina pectoris: Secondary | ICD-10-CM | POA: Diagnosis present

## 2024-02-02 DIAGNOSIS — R350 Frequency of micturition: Secondary | ICD-10-CM

## 2024-02-02 DIAGNOSIS — R569 Unspecified convulsions: Secondary | ICD-10-CM | POA: Diagnosis not present

## 2024-02-02 DIAGNOSIS — I340 Nonrheumatic mitral (valve) insufficiency: Secondary | ICD-10-CM | POA: Diagnosis present

## 2024-02-02 DIAGNOSIS — G4733 Obstructive sleep apnea (adult) (pediatric): Secondary | ICD-10-CM | POA: Diagnosis present

## 2024-02-02 DIAGNOSIS — Z79890 Hormone replacement therapy (postmenopausal): Secondary | ICD-10-CM | POA: Diagnosis not present

## 2024-02-02 DIAGNOSIS — R4701 Aphasia: Secondary | ICD-10-CM | POA: Diagnosis present

## 2024-02-02 DIAGNOSIS — I5032 Chronic diastolic (congestive) heart failure: Secondary | ICD-10-CM | POA: Diagnosis present

## 2024-02-02 DIAGNOSIS — R29810 Facial weakness: Secondary | ICD-10-CM | POA: Diagnosis present

## 2024-02-02 DIAGNOSIS — K219 Gastro-esophageal reflux disease without esophagitis: Secondary | ICD-10-CM | POA: Diagnosis present

## 2024-02-02 DIAGNOSIS — I4819 Other persistent atrial fibrillation: Secondary | ICD-10-CM | POA: Diagnosis present

## 2024-02-02 DIAGNOSIS — E871 Hypo-osmolality and hyponatremia: Secondary | ICD-10-CM

## 2024-02-02 DIAGNOSIS — Z96643 Presence of artificial hip joint, bilateral: Secondary | ICD-10-CM | POA: Diagnosis present

## 2024-02-02 DIAGNOSIS — N3281 Overactive bladder: Secondary | ICD-10-CM | POA: Diagnosis present

## 2024-02-02 DIAGNOSIS — R29701 NIHSS score 1: Secondary | ICD-10-CM | POA: Diagnosis present

## 2024-02-02 DIAGNOSIS — E785 Hyperlipidemia, unspecified: Secondary | ICD-10-CM | POA: Diagnosis present

## 2024-02-02 DIAGNOSIS — I110 Hypertensive heart disease with heart failure: Secondary | ICD-10-CM | POA: Diagnosis present

## 2024-02-02 DIAGNOSIS — E039 Hypothyroidism, unspecified: Secondary | ICD-10-CM | POA: Diagnosis present

## 2024-02-02 LAB — URINALYSIS, W/ REFLEX TO CULTURE (INFECTION SUSPECTED)
Bacteria, UA: NONE SEEN
Bilirubin Urine: NEGATIVE
Glucose, UA: NEGATIVE mg/dL
Hgb urine dipstick: NEGATIVE
Ketones, ur: NEGATIVE mg/dL
Leukocytes,Ua: NEGATIVE
Nitrite: NEGATIVE
Protein, ur: NEGATIVE mg/dL
Specific Gravity, Urine: 1.015 (ref 1.005–1.030)
pH: 8 (ref 5.0–8.0)

## 2024-02-02 LAB — URINALYSIS, ROUTINE W REFLEX MICROSCOPIC
Bacteria, UA: NONE SEEN
Bilirubin Urine: NEGATIVE
Glucose, UA: NEGATIVE mg/dL
Hgb urine dipstick: NEGATIVE
Ketones, ur: NEGATIVE mg/dL
Nitrite: NEGATIVE
Protein, ur: NEGATIVE mg/dL
Specific Gravity, Urine: 1.012 (ref 1.005–1.030)
pH: 7 (ref 5.0–8.0)

## 2024-02-02 LAB — SODIUM
Sodium: 126 mmol/L — ABNORMAL LOW (ref 135–145)
Sodium: 127 mmol/L — ABNORMAL LOW (ref 135–145)
Sodium: 127 mmol/L — ABNORMAL LOW (ref 135–145)

## 2024-02-02 LAB — BASIC METABOLIC PANEL WITH GFR
Anion gap: 7 (ref 5–15)
BUN: 8 mg/dL (ref 8–23)
CO2: 28 mmol/L (ref 22–32)
Calcium: 8.4 mg/dL — ABNORMAL LOW (ref 8.9–10.3)
Chloride: 93 mmol/L — ABNORMAL LOW (ref 98–111)
Creatinine, Ser: 0.72 mg/dL (ref 0.61–1.24)
GFR, Estimated: >60 mL/min
Glucose, Bld: 106 mg/dL — ABNORMAL HIGH (ref 70–99)
Potassium: 4.4 mmol/L (ref 3.5–5.1)
Sodium: 128 mmol/L — ABNORMAL LOW (ref 135–145)

## 2024-02-02 LAB — CBG MONITORING, ED
Glucose-Capillary: 100 mg/dL — ABNORMAL HIGH (ref 70–99)
Glucose-Capillary: 173 mg/dL — ABNORMAL HIGH (ref 70–99)
Glucose-Capillary: 81 mg/dL (ref 70–99)

## 2024-02-02 LAB — GLUCOSE, CAPILLARY
Glucose-Capillary: 119 mg/dL — ABNORMAL HIGH (ref 70–99)
Glucose-Capillary: 108 mg/dL — ABNORMAL HIGH (ref 70–99)

## 2024-02-02 LAB — OSMOLALITY: Osmolality: 262 mosm/kg — ABNORMAL LOW (ref 275–295)

## 2024-02-02 MED ORDER — AMLODIPINE BESYLATE 5 MG PO TABS
2.5000 mg | ORAL_TABLET | Freq: Every day | ORAL | Status: DC
  Administered 2024-02-02: 2.5 mg via ORAL
  Filled 2024-02-02 (×2): qty 1

## 2024-02-02 MED ORDER — VALPROIC ACID 250 MG PO CAPS
500.0000 mg | ORAL_CAPSULE | Freq: Two times a day (BID) | ORAL | Status: DC
  Administered 2024-02-02 – 2024-02-04 (×4): 500 mg via ORAL
  Filled 2024-02-02 (×5): qty 2

## 2024-02-02 MED ORDER — IOHEXOL 350 MG/ML SOLN
75.0000 mL | Freq: Once | INTRAVENOUS | Status: AC | PRN
  Administered 2024-02-02: 75 mL via INTRAVENOUS

## 2024-02-02 MED ORDER — LORAZEPAM 0.5 MG PO TABS
0.5000 mg | ORAL_TABLET | Freq: Two times a day (BID) | ORAL | Status: DC
  Administered 2024-02-02 – 2024-02-04 (×5): 0.5 mg via ORAL
  Filled 2024-02-02 (×5): qty 1

## 2024-02-02 MED ORDER — AMLODIPINE BESYLATE 5 MG PO TABS: 5.0000 mg | ORAL_TABLET | Freq: Every day | ORAL | Status: DC

## 2024-02-02 MED ORDER — SODIUM CHLORIDE 0.9 % IV SOLN: INTRAVENOUS | Status: DC

## 2024-02-02 MED ORDER — LEVETIRACETAM 500 MG PO TABS
500.0000 mg | ORAL_TABLET | Freq: Two times a day (BID) | ORAL | Status: DC
  Administered 2024-02-02 (×2): 500 mg via ORAL
  Filled 2024-02-02 (×2): qty 1

## 2024-02-02 MED ORDER — VALPROATE SODIUM 100 MG/ML IV SOLN
1500.0000 mg | Freq: Once | INTRAVENOUS | Status: AC
  Administered 2024-02-02: 1500 mg via INTRAVENOUS
  Filled 2024-02-02: qty 15

## 2024-02-02 MED ORDER — OXCARBAZEPINE 300 MG PO TABS
300.0000 mg | ORAL_TABLET | Freq: Two times a day (BID) | ORAL | Status: DC
  Administered 2024-02-02 (×2): 300 mg via ORAL
  Filled 2024-02-02 (×2): qty 1

## 2024-02-02 NOTE — Plan of Care (Signed)
  Problem: Coping: Goal: Ability to adjust to condition or change in health will improve Outcome: Progressing   Problem: Metabolic: Goal: Ability to maintain appropriate glucose levels will improve Outcome: Progressing   Problem: Nutritional: Goal: Maintenance of adequate nutrition will improve Outcome: Progressing   Problem: Clinical Measurements: Goal: Diagnostic test results will improve Outcome: Progressing

## 2024-02-02 NOTE — Evaluation (Signed)
 Clinical/Bedside Swallow Evaluation Patient Details  Name: Raymond Christensen MRN: [id number] Date of Birth: 1940-10-20  Today's Date: 02/02/2024 Time: SLP Start Time (ACUTE ONLY): 1125 SLP Stop Time (ACUTE ONLY): 1151 SLP Time Calculation (min) (ACUTE ONLY): 26 min  Past Medical History:  Past Medical History:  Diagnosis Date   Arthritis    CAD (coronary artery disease)    Cancer (HCC)    skin lt. arm.   Carotid artery disease    s/p left CEA 01/17/14   Chronic systolic CHF (congestive heart failure) [facility])    Concussion Summer 2012   Essential hypertension 02/26/2017   Heart disease    Hyperlipidemia    Hypothyroidism 10/15/2017   Major depressive disorder    Mitral valve regurgitation    Myocardial infarction [facility]) 1996, 2021   Obstructive sleep apnea on CPAP    Persistent atrial fibrillation (HCC)    Poor flexibility of tendon 12/01/2018   S/P mitral valve clip implantation 12/23/2019   s/p TEER with MitraClip with one XTW by Dr. Wonda   Subungual hematoma of digit of hand 12/01/2018   Subungual hematoma of right foot 12/01/2018   Type 2 diabetes mellitus without complication, without long-term current use of insulin  (HCC) 02/26/2017   Past Surgical History:  Past Surgical History:  Procedure Laterality Date   ATRIAL FIBRILLATION ABLATION N/A 02/01/2020   Procedure: ATRIAL FIBRILLATION ABLATION;  Surgeon: Inocencio Soyla Lunger, MD;  Location: [facility] INVASIVE CV LAB;  Service: Cardiovascular;  Laterality: N/A;   BYPASS GRAFT  2001   CARDIAC CATHETERIZATION     CARDIOVERSION N/A 11/08/2019   Procedure: CARDIOVERSION;  Surgeon: Delford Maude BROCKS, MD;  Location: [facility] ENDOSCOPY;  Service: Cardiovascular;  Laterality: N/A;   CAROTID ENDARTERECTOMY Left 01/17/2014   CORONARY ANGIOPLASTY     CORONARY ARTERY BYPASS GRAFT     LEFT HEART CATH AND CORS/GRAFTS ANGIOGRAPHY N/A 06/26/2022   Procedure: LEFT HEART CATH AND CORS/GRAFTS ANGIOGRAPHY;  Surgeon: Burnard Debby LABOR, MD;  Location: [facility]  INVASIVE CV LAB;  Service: Cardiovascular;  Laterality: N/A;   RIGHT/LEFT HEART CATH AND CORONARY/GRAFT ANGIOGRAPHY N/A 09/09/2019   Procedure: RIGHT/LEFT HEART CATH AND CORONARY/GRAFT ANGIOGRAPHY;  Surgeon: Court Dorn PARAS, MD;  Location: [facility] INVASIVE CV LAB;  Service: Cardiovascular;  Laterality: N/A;   TEE WITHOUT CARDIOVERSION N/A 09/13/2019   Procedure: TRANSESOPHAGEAL ECHOCARDIOGRAM (TEE);  Surgeon: Lonni Slain, MD;  Location: [facility] ENDOSCOPY;  Service: Cardiovascular;  Laterality: N/A;   TEE WITHOUT CARDIOVERSION N/A 12/23/2019   Procedure: TRANSESOPHAGEAL ECHOCARDIOGRAM (TEE);  Surgeon: Wonda Sharper, MD;  Location: [facility] INVASIVE CV LAB;  Service: Cardiovascular;  Laterality: N/A;   TEE WITHOUT CARDIOVERSION  02/01/2020   Procedure: TRANSESOPHAGEAL ECHOCARDIOGRAM (TEE);  Surgeon: Inocencio Soyla Lunger, MD;  Location: [facility] INVASIVE CV LAB;  Service: Cardiovascular;;   TOTAL HIP ARTHROPLASTY  1994, 1995   TOTAL HIP ARTHROPLASTY Bilateral    TRANSCATHETER MITRAL EDGE TO EDGE REPAIR N/A 12/23/2019   Procedure: MITRAL VALVE REPAIR;  Surgeon: Wonda Sharper, MD;  Location: [facility] INVASIVE CV LAB;  Service: Cardiovascular;  Laterality: N/A;   TRANSURETHRAL RESECTION OF PROSTATE N/A 12/27/2022   Procedure: TRANSURETHRAL RESECTION OF THE PROSTATE (TURP);  Surgeon: Nieves Cough, MD;  Location: [facility] ORS;  Service: Urology;  Laterality: N/A;  75 minute case   HPI:  Raymond Christensen is a 84 y.o. male admitted on 02/01/24 for code stroke  for slurred speech and mild facial droop. Past history of paroxysmal A-fib on Eliquis . Recent admission in December 2025 for acute left hemisphere hemorrhage and  was found to have some expressive and receptive aphasia per SLP assessment at that time. Pt reported language deficits spontaneously resolved and that his communication was at baseline prior to this admission.    Assessment / Plan / Recommendation  Clinical Impression   Pt presents with a functional  oropharyngeal swallow per clinical swallow assessment completed today. Oral prep and transit prompt with complete oral clearance. Pharyngeal swallow initiation appeared timely with laryngeal elevation noted. No overt or subtle s/s of aspiration noted across consistencies.   Recommend continue current diet at tolerated and PO meds as tolerated. No acute SLP needs identified for swallowing at this time. SLP will sign off. Please re-consult if pt exhibits concerns for aspiration with PO intake.   SLP Visit Diagnosis:  (functional oropharyngeal swallow)    Aspiration Risk  No limitations    Diet Recommendation Regular;Thin liquid    Liquid Administration via: Straw;Cup Medication Administration: Whole meds with liquid Supervision: Patient able to self feed Compensations: Slow rate    Other Recommendations Oral Care Recommendations: Oral care BID      Functional Status Assessment Patient has not had a recent decline in their functional status (re: swallow function)         Prognosis Prognosis for improved oropharyngeal function: Good      Swallow Study   Raymond Date of Onset: 02/01/24 HPI: Raymond Christensen is a 84 y.o. male admitted on 02/01/24 for code stroke  for slurred speech and mild facial droop. Past history of paroxysmal A-fib on Eliquis . Recent admission in December 2025 for acute left hemisphere hemorrhage and was found to have some expressive and receptive aphasia per SLP assessment at that time. Pt reported language deficits spontaneously resolved and that his communication was at baseline prior to this admission. Type of Study: Bedside Swallow Evaluation Previous Swallow Assessment: none per chart Diet Prior to this Study: Regular;Thin liquids (Level 0) Temperature Spikes Noted: No Respiratory Status: Room air History of Recent Intubation: No Behavior/Cognition: Alert;Cooperative;Pleasant mood Oral Cavity Assessment: Within Functional Limits Oral Cavity - Dentition:  Adequate natural dentition Vision: Functional for self-feeding Self-Feeding Abilities: Able to feed self Patient Positioning: Upright in bed Baseline Vocal Quality: Normal Volitional Cough: Strong Volitional Swallow: Able to elicit    Oral/Motor/Sensory Function Overall Oral Motor/Sensory Function: Mild impairment Facial ROM: Reduced right Facial Symmetry: Abnormal symmetry right Facial Strength: Reduced right Lingual ROM: Within Functional Limits Lingual Symmetry: Within Functional Limits Lingual Strength: Within Functional Limits Lingual Sensation: Within Functional Limits Velum: Within Functional Limits Mandible: Within Functional Limits   Ice Chips Ice chips: Not tested   Thin Liquid Thin Liquid: Within functional limits Presentation: Self Fed    Nectar Thick Nectar Thick Liquid: Not tested   Honey Thick Honey Thick Liquid: Not tested   Puree Puree: Within functional limits Presentation: Self Fed   Solid     Solid: Within functional limits Presentation: Self Fed      Raymond Christensen JINNY Rummer 02/02/2024,12:42 PM

## 2024-02-02 NOTE — Progress Notes (Signed)
 EEG complete - results pending

## 2024-02-02 NOTE — ED Notes (Signed)
 Patient transported to MRI

## 2024-02-02 NOTE — Plan of Care (Signed)

## 2024-02-02 NOTE — ED Notes (Signed)
 Help get patient cleaned up place a external cath patient is resting with call bell in reach

## 2024-02-02 NOTE — ED Notes (Signed)
 Pt found to be saturated head to toe in urine, pt cleaned, linens changed and male purewick placed

## 2024-02-02 NOTE — Procedures (Signed)
 Patient Name: Raymond Christensen  MRN: [id number]  Epilepsy Attending: Arlin MALVA Krebs  Referring Physician/Provider: Alfornia Madison, MD  Date: 02/02/2024 Duration: 22.59 mins  Patient history: 84 y.o. male with slurred speech and a mild right facial droop. EEG to evaluate for seizure   Level of alertness: Awake, asleep  AEDs during EEG study: LEV, OXC, Ativan   Technical aspects: This EEG study was done with scalp electrodes positioned according to the 10-20 International system of electrode placement. Electrical activity was reviewed with band pass filter of 1-70Hz , sensitivity of 7 uV/mm, display speed of 48mm/sec with a 60Hz  notched filter applied as appropriate. EEG data were recorded continuously and digitally stored.  Video monitoring was available and reviewed as appropriate.  Description: The posterior dominant rhythm consists of 7.5 Hz activity of moderate voltage (25-35 uV) seen predominantly in posterior head regions, symmetric and reactive to eye opening and eye closing. Sleep was characterized by vertex waves, sleep spindles (12 to 14 Hz), maximal frontocentral region. There is intermittent generalized 3 to 6 Hz theta-delta slowing. Hyperventilation and photic stimulation were not performed.     ABNORMALITY - Intermittent slow, generalized  IMPRESSION: This study is suggestive of mild generalized cerebral dysfunction ( encephalopathy). No seizures or epileptiform discharges were seen throughout the recording.  Araina Butrick O Zamarah Ullmer

## 2024-02-02 NOTE — Progress Notes (Addendum)
 " PROGRESS NOTE    Raymond Christensen  FMW:[id number] DOB: 03-07-1940 DOA: 02/01/2024 PCP: Frann Mabel Mt, DO   Brief Narrative: 84 year old with past medical history significant for CAD status post CABG, carotid artery disease status post left CEA 2016, CHF, mitral regurgitation status post TEE or with MitraClip 2021, A-fib, hypertension, hyperlipidemia, hypothyroidism, diabetes type 2, depression, anxiety, OSA on CPAP, GERD, BPH overreactive bladder who was admitted to the hospital last month from 12/12 until 12/29 for traumatic left temporal intracranial hemorrhage, subarachnoid hemorrhage, subdural hemorrhage, expressive aphasia and seizure.  He was started on Keppra  and Eliquis  was discontinued.  Home Wellbutrin  was discontinued due to seizure.  Felt to have some melena with positive FOBT GI recommended monitoring.  Started on midodrine  for orthostatic hypotension.  Patient presented today via EMS for sudden onset of slurred speech and right facial droop.  Blood pressure was elevated 198/82.  CT head showed prior ICH/SAH/S [facility] which has improved/stable no acute finding.  Neurology suspecting recrudescence of prior ICH SAH versus possible focal seizure.  Plan to obtain EEG.   Assessment & Plan:   Principal Problem:   Slurred speech Active Problems:   History of seizures   Hyperlipidemia   Hyponatremia   Urinary frequency  1-Acute onset of slurred speech and mild right facial droop: Patient presents with acute onset of slurred speech, per wife patient speech was back to baseline prior to discharge last time.  -CT head:  Trace subarachnoid hemorrhage within left temporal, parietal, and occipital lobes, decreased compared to prior study. Resolving left temporal lobe parenchymal hemorrhage. Stable 2 mm left parietal subdural hematoma. No new or enlarging hemorrhage. No acute infarct. -MRI brain:  Loss of the flow void within the left transverse and sigmoid sinuses, suspicious for dural  venous sinus thrombosis.  Improvement of subarachnoid hemorrhage, hemosiderin throughout the cerebral foci bilaterally worse on the left sequela of prior subarachnoid hemorrhage. Marginally improved thin subdural hematoma overlying the left parietal lobe. - CT venogram: No subdural venous thrombosis - EEG pending - Neurology consulted   Hyponatremia: - Improved with IV fluids, could be dehydration versus cerebral salt wasting syndrome. - Continue to monitor sodium -will order 24 hours sodium urine  Urinary frequency: UA negative for UTI, will proceed with urine culture. Also concern for cerebral salt wasting syndrome  Prior ICH/SAH/SDH History of seizure - Provide Keppra  has been decreased or tapered down 0.1 g currently he is supposed to be on 500 mg daily he was started on oxcarbazepine  - Neurology informed of above recommendation - They will adjust medication as needed for seizures  Paroxysmal A-fib CAD - Heart rate in the 50s, currently holding beta-blockers -Coagulation due to #1  Hypertension; As needed hydralazine  Will start low-dose of amlodipine , unable to resume beta-blocker at this time due to bradycardia  Hyperlipidemia Continue with Setia and Lipitor   Hypothyroidism Continue Synthroid   Correction per wife patient does not have a history of diabetes   Chronic diastolic heart failure Mitral regurgitation status post TER with MitraClip 2021 Monitor  Depression/anxiety Continue sertraline  paroxetine  and lorazepam   OSA continue CPAP at bedtime GERD continue Pepcid  BPH continue finasteride    History of Orthostatic Hypotension.  Last admission.  Discharge on low dose midodrine  2.5 BID.  Midodrine  was discontinue as out patient be\cause of elevated BP Continue to hold midodrine ,.      Estimated body mass index is 25.46 kg/m as calculated from the following:   Height as of this encounter: 5' 9.6 (1.768 m).   Weight  as of this encounter: 79.6  kg.   DVT prophylaxis: SCD Code Status: Full code Family Communication: Wife updated at bedside, son was over the phone Disposition Plan:  Status is: Observation The patient will require care spanning > 2 midnights and should be moved to inpatient because: Management of hyponatremia and new onset of slurred speech    Consultants:  Neurology  Procedures:    Antimicrobials:    Subjective: Patient was alert seen this morning, still have some slurred speech Not back to baseline.  He follows command.  Objective: Vitals:   02/02/24 0730 02/02/24 0745 02/02/24 0800 02/02/24 0815  BP: 125/63 139/63 138/63 (!) 150/83  Pulse: (!) 57 (!) 57 (!) 52 (!) 56  Resp: 15 14 15 15   Temp:      TempSrc:      SpO2: 97% 100% 90% 98%  Weight:      Height:       No intake or output data in the 24 hours ending 02/02/24 0919 Filed Weights   02/01/24 1733 02/01/24 1744  Weight: 95.8 kg 79.6 kg    Examination:  General exam: Appears calm and comfortable  Respiratory system: Clear to auscultation. Respiratory effort normal. Cardiovascular system: S1 & S2 heard, RRR. No JVD, murmurs, rubs, gallops or clicks. No pedal edema. Gastrointestinal system: Abdomen is nondistended, soft and nontender. No organomegaly or masses felt. Normal bowel sounds heard. Central nervous system: Alert and oriented.  Follows Commands Extremities: Symmetric 5 x 5 power.   Data Reviewed: I have personally reviewed following labs and imaging studies  CBC: Recent Labs  Lab 02/01/24 1743 02/01/24 1800  WBC  --  8.0  NEUTROABS  --  5.0  HGB 14.6 12.9*  HCT 43.0 39.0  MCV  --  85.5  PLT  --  177   Basic Metabolic Panel: Recent Labs  Lab 02/01/24 1743 02/01/24 1800 02/02/24 0202  NA 123* 124* 126*  K 5.5* 4.3  --   CL 92* 91*  --   CO2  --  22  --   GLUCOSE 99 100*  --   BUN 11 9  --   CREATININE 0.70 0.67  --   CALCIUM   --  8.8*  --    GFR: Estimated Creatinine Clearance: 72.6 mL/min (by C-G  formula based on SCr of 0.67 mg/dL). Liver Function Tests: Recent Labs  Lab 02/01/24 1800  AST 30  ALT 33  ALKPHOS 90  BILITOT 0.4  PROT 6.4*  ALBUMIN  3.8   No results for input(s): LIPASE, AMYLASE in the last 168 hours. No results for input(s): AMMONIA in the last 168 hours. Coagulation Profile: Recent Labs  Lab 02/01/24 1800  INR 0.9   Cardiac Enzymes: No results for input(s): CKTOTAL, CKMB, CKMBINDEX, TROPONINI in the last 168 hours. BNP (last 3 results) No results for input(s): PROBNP in the last 8760 hours. HbA1C: No results for input(s): HGBA1C in the last 72 hours. CBG: Recent Labs  Lab 02/01/24 1729 02/02/24 0008 02/02/24 0741  GLUCAP 110* 173* 81   Lipid Profile: No results for input(s): CHOL, HDL, LDLCALC, TRIG, CHOLHDL, LDLDIRECT in the last 72 hours. Thyroid  Function Tests: No results for input(s): TSH, T4TOTAL, FREET4, T3FREE, THYROIDAB in the last 72 hours. Anemia Panel: No results for input(s): VITAMINB12, FOLATE, FERRITIN, TIBC, IRON, RETICCTPCT in the last 72 hours. Sepsis Labs: No results for input(s): PROCALCITON, LATICACIDVEN in the last 168 hours.  No results found for this or any previous visit (from the past  240 hours).       Radiology Studies: CT HEAD CODE STROKE WO CONTRAST Result Date: 02/01/2024 EXAM: CT HEAD WITHOUT CONTRAST 02/01/2024 05:39:17 PM TECHNIQUE: CT of the head was performed without the administration of intravenous contrast. Automated exposure control, iterative reconstruction, and/or weight based adjustment of the mA/kV was utilized to reduce the radiation dose to as low as reasonably achievable. COMPARISON: CT head without contrast and MR head without contrast 01/02/2024. CLINICAL HISTORY: Neuro deficit, acute, stroke suspected. FINDINGS: BRAIN AND VENTRICLES: Trace subarachnoid hemorrhage within left temporal, parietal and occipital lobes. Decreased compared to prior  study. Resolving left temporal lobe parenchymal hemorrhage. Stable 2 mm left parietal subdural hematoma. Generalized cerebral atrophy is stable. Patchy white matter low-density suggestive of chronic small vessel ischemic disease, stable. No evidence of acute infarct. No hydrocephalus. No mass effect or midline shift. ORBITS: No acute abnormality. SINUSES: No acute abnormality. SOFT TISSUES AND SKULL: No acute soft tissue abnormality. No skull fracture. IMPRESSION: 1. Trace subarachnoid hemorrhage within left temporal, parietal, and occipital lobes, decreased compared to prior study. 2. Resolving left temporal lobe parenchymal hemorrhage. 3. Stable 2 mm left parietal subdural hematoma. 4. No new or enlarging hemorrhage. No acute infarct. Electronically signed by: Lonni Necessary MD 02/01/2024 05:44 PM EST RP Workstation: [id number]        Scheduled Meds:  atorvastatin   80 mg Oral Daily   ezetimibe   10 mg Oral Daily   famotidine   20 mg Oral Daily   finasteride   5 mg Oral Daily   insulin  aspart  0-5 Units Subcutaneous QHS   insulin  aspart  0-9 Units Subcutaneous TID WC   levETIRAcetam   500 mg Oral BID   levothyroxine   75 mcg Oral Q0600   liothyronine   10 mcg Oral Daily   LORazepam   0.5 mg Oral BID   Oxcarbazepine   300 mg Oral BID   PARoxetine   10 mg Oral Daily   sertraline   50 mg Oral Daily   Continuous Infusions:  sodium chloride  75 mL/hr at 02/02/24 0201     LOS: 0 days    Time spent: 35 Minutes    Merrell Borsuk A Amorah Sebring, MD [facility]   If 7PM-7AM, please contact night-coverage [email or web address]  02/02/2024, 9:19 AM   "

## 2024-02-02 NOTE — Progress Notes (Addendum)
 NEUROLOGY CONSULT FOLLOW UP NOTE   Date of service: February 02, 2024 Patient Name: Raymond Christensen MRN:  [id number] DOB:  [birth year]  Interval Hx/subjective  Patient seen and examined.  Reports still feeling generally weak No focal deficits on exam noted  Vitals   Vitals:   02/02/24 0730 02/02/24 0745 02/02/24 0800 02/02/24 0815  BP: 125/63 139/63 138/63 (!) 150/83  Pulse: (!) 57 (!) 57 (!) 52 (!) 56  Resp: 15 14 15 15   Temp:      TempSrc:      SpO2: 97% 100% 90% 98%  Weight:      Height:         Body mass index is 25.46 kg/m.  Physical Exam   GEN: Awake alert in no distress HEENT: Normocephalic atraumatic Lungs: Clear Neurologic exam Awake alert oriented x 3 Mild dysarthria No aphasia Cranial nerves II to XII: Mild right facial droop, otherwise intact. Motor examination with no drift Sensation intact to light touch without extinction   Medications Current Medications[1]  Labs and Diagnostic Imaging   CBC:  Recent Labs  Lab 02/01/24 1743 02/01/24 1800  WBC  --  8.0  NEUTROABS  --  5.0  HGB 14.6 12.9*  HCT 43.0 39.0  MCV  --  85.5  PLT  --  177    Basic Metabolic Panel:  Lab Results  Component Value Date   NA 126 (L) 02/02/2024   K 4.3 02/01/2024   CO2 22 02/01/2024   GLUCOSE 100 (H) 02/01/2024   BUN 9 02/01/2024   CREATININE 0.67 02/01/2024   CALCIUM  8.8 (L) 02/01/2024   GFRNONAA >60 02/01/2024   GFRAA 49 (L) 01/13/2020   Lipid Panel:  Lab Results  Component Value Date   LDLCALC 48 12/22/2023   HgbA1c:  Lab Results  Component Value Date   HGBA1C 5.5 12/22/2023   Urine Drug Screen:     Component Value Date/Time   LABOPIA NONE DETECTED 12/26/2023 1221   COCAINSCRNUR NONE DETECTED 12/26/2023 1221   LABBENZ NONE DETECTED 12/26/2023 1221   AMPHETMU NONE DETECTED 12/26/2023 1221   THCU NONE DETECTED 12/26/2023 1221   LABBARB NONE DETECTED 12/26/2023 1221    Alcohol Level     Component Value Date/Time   ETH <15 02/01/2024 1800    INR  Lab Results  Component Value Date   INR 0.9 02/01/2024   APTT  Lab Results  Component Value Date   APTT 35 02/01/2024   CT head without contrast personally reviewed-negative for acute process  Assessment   Raymond Christensen is a 84 y.o. male past history of paroxysmal A-fib on Eliquis  in the past but had a fall with traumatic SDH/SAH/IPH and Eliquis  was held.  He was brought in as a code stroke yesterday for slurred speech and mild facial droop.  Still has persistent right facial droop. Question recrudescence of old stroke symptoms versus new stroke versus seizure. Initial plan was not to obtain any further imaging but he reports that he still does not feel back to his baseline although on exam he only has a mild right facial droop-further imaging and EEG will be helpful.  Had electrolyte derangements that are being evaluated by the hospitalist. I suspect that the hyponatremia may also be from oxcarbazepine -has Keppra  was being tapered down due to side effects of irritation and agitation and he was being treated with oxcarbazepine .    Recommendations  MRI brain without contrast Routine EEG Will follow the above tests and will update the hospitalist  with the final plan after the tests are available Preliminary plan relayed to Dr. Fransisca ______________________________________________________________________  Signed, Eligio Lav, MD [facility]  Addendum MRI brain without any evidence of new stroke or bleed. There is concern of loss of flow-void in the dural venous sinus-venogram of the head recommended by radiology which has been ordered Will follow  Addendum CTV negative for dural venous sinus thrombus  I had a detailed chat with his wife.  They have been in touch with Dr. Gregg although they have not seen him yet.  Due to the side effects of Keppra , mostly psychiatric side effects, he was recommended to taper down on Keppra  and start oxcarbazepine .  Now  with his hyponatremia, I do not feel oxcarbazepine  is a good medication for him. Given his need for mood stabilization, I feel valproate would be the best choice for him.  Recommendations Discontinue Keppra  Discontinue oxcarbazepine  Load with valproate 1500 mg x 1 Start valproate 500 twice daily from tonight. Check valproate level in about 5 days. I would recommend following up with the PCP in 3 to 5 days Follow-up with Dr. Lincoln have an appointment in March. I would also recommend doing a neuropsychological evaluation at that time as the wife reports that he is slower in performing his ADLs.  Discussed with his wife and Dr. Madelyne.  Eligio Lav, MD     [1]  Current Facility-Administered Medications:    0.9 %  sodium chloride  infusion, , Intravenous, Continuous, Alfornia Madison, MD, Last Rate: 75 mL/hr at 02/02/24 0201, New Bag at 02/02/24 0201   acetaminophen  (TYLENOL ) tablet 650 mg, 650 mg, Oral, Q6H PRN **OR** acetaminophen  (TYLENOL ) suppository 650 mg, 650 mg, Rectal, Q6H PRN, Alfornia Madison, MD   atorvastatin  (LIPITOR ) tablet 80 mg, 80 mg, Oral, Daily, Alfornia Madison, MD   docusate sodium  (COLACE) capsule 100-200 mg, 100-200 mg, Oral, QHS PRN, Rathore, Vasundhra, MD   ezetimibe  (ZETIA ) tablet 10 mg, 10 mg, Oral, Daily, Rathore, Vasundhra, MD   famotidine  (PEPCID ) tablet 20 mg, 20 mg, Oral, Daily, Rathore, Vasundhra, MD   finasteride  (PROSCAR ) tablet 5 mg, 5 mg, Oral, Daily, Rathore, Vasundhra, MD   hydrALAZINE  (APRESOLINE ) injection 5 mg, 5 mg, Intravenous, Q4H PRN, Rathore, Vasundhra, MD   insulin  aspart (novoLOG ) injection 0-5 Units, 0-5 Units, Subcutaneous, QHS, Rathore, Vasundhra, MD   insulin  aspart (novoLOG ) injection 0-9 Units, 0-9 Units, Subcutaneous, TID WC, Rathore, Vasundhra, MD   levETIRAcetam  (KEPPRA ) tablet 500 mg, 500 mg, Oral, BID, Rathore, Vasundhra, MD, 500 mg at 02/02/24 0054   levothyroxine  (SYNTHROID ) tablet 75 mcg, 75 mcg, Oral, Q0600,  Rathore, Vasundhra, MD, 75 mcg at 02/02/24 0700   liothyronine  (CYTOMEL ) tablet 10 mcg, 10 mcg, Oral, Daily, Rathore, Vasundhra, MD   LORazepam  (ATIVAN ) tablet 0.5 mg, 0.5 mg, Oral, BID, Rathore, Vasundhra, MD   Oxcarbazepine  (TRILEPTAL ) tablet 300 mg, 300 mg, Oral, BID, Rathore, Vasundhra, MD, 300 mg at 02/02/24 0054   PARoxetine  (PAXIL ) tablet 10 mg, 10 mg, Oral, Daily, Alfornia Madison, MD   sertraline  (ZOLOFT ) tablet 50 mg, 50 mg, Oral, Daily, Rathore, Vasundhra, MD  Current Outpatient Medications:    acetaminophen  (TYLENOL ) 500 MG tablet, Take 1,000 mg by mouth daily., Disp: , Rfl:    atorvastatin  (LIPITOR ) 80 MG tablet, TAKE 1 TABLET BY MOUTH DAILY, Disp: 90 tablet, Rfl: 3   docusate sodium  (COLACE) 100 MG capsule, Take 100-200 mg by mouth at bedtime., Disp: , Rfl:    ezetimibe  (ZETIA ) 10 MG tablet, TAKE 1 TABLET BY MOUTH DAILY, Disp:  90 tablet, Rfl: 3   famotidine  (PEPCID ) 20 MG tablet, Take 20 mg by mouth daily., Disp: , Rfl:    finasteride  (PROSCAR ) 5 MG tablet, Take 1 tablet (5 mg total) by mouth daily., Disp: 90 tablet, Rfl: 3   levETIRAcetam  (KEPPRA ) 500 MG tablet, Take 1 tablet (500 mg total) by mouth 2 (two) times daily. (Patient taking differently: Take 250 mg by mouth See admin instructions. Patients spouse states that he was instructed to taper off of this medication. She states the patient is now on (250mg ) half of a tablet once a day), Disp: 180 tablet, Rfl: 1   levothyroxine  (SYNTHROID ) 75 MCG tablet, TAKE 1 TABLET BY MOUTH DAILY, Disp: 90 tablet, Rfl: 1   liothyronine  (CYTOMEL ) 5 MCG tablet, TAKE 2 TABLETS BY MOUTH DAILY, Disp: 180 tablet, Rfl: 1   LORazepam  (ATIVAN ) 0.5 MG tablet, 1 bid (Patient taking differently: Take 0.5 mg by mouth 2 (two) times daily.), Disp: 60 tablet, Rfl: 4   metoprolol  succinate (TOPROL -XL) 25 MG 24 hr tablet, Take 0.5 tablets (12.5 mg total) by mouth daily., Disp: 45 tablet, Rfl: 3   midodrine  (PROAMATINE ) 2.5 MG tablet, Take 1 tablet (2.5 mg  total) by mouth 2 (two) times daily with a meal. This should be adjusted by your outpatient doctor as appropriate based on your orthostatic hypotension., Disp: 60 tablet, Rfl: 0   Multiple Vitamins-Minerals (MULTIVITAMIN ADULT EXTRA C PO), Take 1 tablet by mouth daily. , Disp: , Rfl:    nitroGLYCERIN  (NITROSTAT ) 0.4 MG SL tablet, Place 1 tablet (0.4 mg total) under the tongue every 5 (five) minutes as needed for chest pain., Disp: 30 tablet, Rfl: 12   Oxcarbazepine  (TRILEPTAL ) 300 MG tablet, Take 1 tablet (300 mg total) by mouth 2 (two) times daily., Disp: 60 tablet, Rfl: 3   PARoxetine  (PAXIL ) 10 MG tablet, TAKE 1 TABLET BY MOUTH DAILY, Disp: 90 tablet, Rfl: 0   polyethylene glycol powder (GLYCOLAX /MIRALAX ) 17 GM/SCOOP powder, Dissolve 17 g as directed and take by mouth daily. Titrate as needed for a soft bowel movement daily, Disp: 238 g, Rfl: 0   sertraline  (ZOLOFT ) 50 MG tablet, Take 1 tablet (50 mg total) by mouth daily., Disp: 30 tablet, Rfl: 5   Cholecalciferol  (VITAMIN D3) 250 MCG (10000 UT) TABS, Take 1 tablet by mouth daily., Disp: , Rfl:    [Paused] OZEMPIC , 2 MG/DOSE, 8 MG/3ML SOPN, INJECT SUBCUTANEOUSLY 2 MG EVERY WEEK ON SUNDAY AS DIRECTED (Patient taking differently: Inject 2 mg into the skin once a week.), Disp: 9 mL, Rfl: 3   UNABLE TO FIND, Place into the left eye in the morning, at noon, in the evening, and at bedtime. Prednisolone  Phosphate 1% /  Moxifloxacin  0.5% / Bromfenac  0.075% Sterile Ophthalmic Solution (Patient not taking: Reported on 02/01/2024), Disp: , Rfl:

## 2024-02-02 NOTE — Evaluation (Signed)
 Speech Language Pathology Evaluation Patient Details Name: Raymond Christensen MRN: [id number] DOB: 1940/11/11 Today's Date: 02/02/2024 Time: 8874-8848 SLP Time Calculation (min) (ACUTE ONLY): 26 min  Problem List:  Patient Active Problem List   Diagnosis Date Noted   Hyponatremia 02/02/2024   Urinary frequency 02/02/2024   Slurred speech 02/01/2024   Seizure disorder (HCC) 01/29/2024   History of subdural hemorrhage 01/29/2024   Fecal occult blood test positive 01/29/2024   Expressive aphasia 12/28/2023   Seizure (HCC) 12/28/2023   Subarachnoid hemorrhage (HCC) 12/26/2023   GAD (generalized anxiety disorder) 01/10/2023   Pain in left wrist 11/20/2022   S/P CABG (coronary artery bypass graft) 06/27/2022   Hypokalemia 06/27/2022   NSTEMI (non-ST elevated myocardial infarction) (HCC) 06/26/2022   PVC's (premature ventricular contractions) 05/31/2022   Lipoma of right upper extremity 03/02/2021   Trigger little finger of left hand 09/29/2020   Secondary hypercoagulable state 02/29/2020   Depression, major, single episode, moderate (HCC) 02/28/2020   Nonrheumatic tricuspid valve regurgitation    S/P mitral valve clip implantation 12/23/2019   Mitral valve disease 12/23/2019   Nonrheumatic mitral valve regurgitation    Persistent atrial fibrillation (HCC)    Chronic HFrEF    Acute idiopathic gout involving toe of left foot    Thrombocytopenia 09/08/2019   CAD (coronary artery disease)    Type 2 diabetes mellitus in patient with obesity (HCC) 06/22/2019   Major depressive disorder    Obstructive sleep apnea on CPAP    Hypothyroidism 10/15/2017   Subclavian artery stenosis, right 06/15/2017   Severe obesity (BMI 35.0-39.9) with comorbidity (HCC) 02/26/2017   Essential hypertension 02/26/2017   Memory change 01/10/2017   Urge incontinence of urine 01/10/2017   Lightheaded 06/25/2016   AK (actinic keratosis) 01/03/2016   History of seizures 03/06/2014   Occlusion and stenosis of  left carotid artery 01/11/2014   Routine history and physical examination of adult 11/08/2013   History of repair of hip joint 04/20/2013   Hyperlipidemia 04/20/2013   Presence of artificial hip joint 04/20/2013   Vitamin D  deficiency 06/18/2010   Concussion 01/24/2010   Elevated prostate specific antigen (PSA) 01/01/2007   Benign prostatic hyperplasia without urinary obstruction 04/22/2006   Osteoarthrosis 04/22/2006   Dyslipidemia 04/02/2006   Past Medical History:  Past Medical History:  Diagnosis Date   Arthritis    CAD (coronary artery disease)    Cancer (HCC)    skin lt. arm.   Carotid artery disease    s/p left CEA 01/17/14   Chronic systolic CHF (congestive heart failure) [facility])    Concussion Summer 2012   Essential hypertension 02/26/2017   Heart disease    Hyperlipidemia    Hypothyroidism 10/15/2017   Major depressive disorder    Mitral valve regurgitation    Myocardial infarction [facility]) 1996, 2021   Obstructive sleep apnea on CPAP    Persistent atrial fibrillation (HCC)    Poor flexibility of tendon 12/01/2018   S/P mitral valve clip implantation 12/23/2019   s/p TEER with MitraClip with one XTW by Dr. Wonda   Subungual hematoma of digit of hand 12/01/2018   Subungual hematoma of right foot 12/01/2018   Type 2 diabetes mellitus without complication, without long-term current use of insulin  (HCC) 02/26/2017   Past Surgical History:  Past Surgical History:  Procedure Laterality Date   ATRIAL FIBRILLATION ABLATION N/A 02/01/2020   Procedure: ATRIAL FIBRILLATION ABLATION;  Surgeon: Inocencio Soyla Lunger, MD;  Location: [facility] INVASIVE CV LAB;  Service: Cardiovascular;  Laterality: N/A;  BYPASS GRAFT  2001   CARDIAC CATHETERIZATION     CARDIOVERSION N/A 11/08/2019   Procedure: CARDIOVERSION;  Surgeon: Delford Maude BROCKS, MD;  Location: [facility] ENDOSCOPY;  Service: Cardiovascular;  Laterality: N/A;   CAROTID ENDARTERECTOMY Left 01/17/2014   CORONARY ANGIOPLASTY     CORONARY  ARTERY BYPASS GRAFT     LEFT HEART CATH AND CORS/GRAFTS ANGIOGRAPHY N/A 06/26/2022   Procedure: LEFT HEART CATH AND CORS/GRAFTS ANGIOGRAPHY;  Surgeon: Burnard Debby LABOR, MD;  Location: [facility] INVASIVE CV LAB;  Service: Cardiovascular;  Laterality: N/A;   RIGHT/LEFT HEART CATH AND CORONARY/GRAFT ANGIOGRAPHY N/A 09/09/2019   Procedure: RIGHT/LEFT HEART CATH AND CORONARY/GRAFT ANGIOGRAPHY;  Surgeon: Court Dorn PARAS, MD;  Location: [facility] INVASIVE CV LAB;  Service: Cardiovascular;  Laterality: N/A;   TEE WITHOUT CARDIOVERSION N/A 09/13/2019   Procedure: TRANSESOPHAGEAL ECHOCARDIOGRAM (TEE);  Surgeon: Lonni Slain, MD;  Location: [facility] ENDOSCOPY;  Service: Cardiovascular;  Laterality: N/A;   TEE WITHOUT CARDIOVERSION N/A 12/23/2019   Procedure: TRANSESOPHAGEAL ECHOCARDIOGRAM (TEE);  Surgeon: Wonda Sharper, MD;  Location: [facility] INVASIVE CV LAB;  Service: Cardiovascular;  Laterality: N/A;   TEE WITHOUT CARDIOVERSION  02/01/2020   Procedure: TRANSESOPHAGEAL ECHOCARDIOGRAM (TEE);  Surgeon: Inocencio Soyla Lunger, MD;  Location: [facility] INVASIVE CV LAB;  Service: Cardiovascular;;   TOTAL HIP ARTHROPLASTY  1994, 1995   TOTAL HIP ARTHROPLASTY Bilateral    TRANSCATHETER MITRAL EDGE TO EDGE REPAIR N/A 12/23/2019   Procedure: MITRAL VALVE REPAIR;  Surgeon: Wonda Sharper, MD;  Location: [facility] INVASIVE CV LAB;  Service: Cardiovascular;  Laterality: N/A;   TRANSURETHRAL RESECTION OF PROSTATE N/A 12/27/2022   Procedure: TRANSURETHRAL RESECTION OF THE PROSTATE (TURP);  Surgeon: Nieves Cough, MD;  Location: [facility] ORS;  Service: Urology;  Laterality: N/A;  75 minute case   HPI:  Raymond Christensen is a 84 y.o. male admitted on 02/01/24 for code stroke  for slurred speech and mild facial droop. Past history of paroxysmal A-fib on Eliquis . Recent admission in December 2025 for acute left hemisphere hemorrhage and was found to have some expressive and receptive aphasia per SLP assessment at that time. Pt reported language deficits  spontaneously resolved and that his communication was at baseline prior to this admission.   Assessment / Plan / Recommendation Clinical Impression   Pt presents with mild dysarthria and nonfluent mixed expressive/receptive aphasia, that may be residual communication deficits from recent [facility] in Dec 2025. Pt reported that his speech and language functions returned to baseline following stroke in December; however, question accuracy of his report. No family at bedside to provide further background. Compared to SLP assessment from December admission, his deficits certainly seem more mild.   Recommend SLP follow up as needed in outpatient or home health setting to address speech and language deficits. Acute SLP will continue to follow as needed during hospitalization.  Speech: slight mis-articulations noted on some phonemes, primarily fricatives, in conversation. Overall, speech intelligibility observed to be >95% to unfamiliar listener.   Language: Observed mild word-finding difficulty in conversation and reduced auditory comprehension of moderate-complexity Y/N questions. Pt typically able to correct with repetition of question; however, occasional confabulatory responses noted.   Cognition: Pt disoriented to time and reported date is Dec 19th, 2025, which is the date of his last admission. Easily re-oriented. Orientated to self, place, and situation. Attention appeared [facility] for simple tasks. Question pt's overall awareness of deficits. Further cognitive assessment may be beneficial.     SLP Assessment  SLP Recommendation/Assessment: Patient needs continued Speech Language Pathology Services SLP Visit  Diagnosis: Aphasia (R47.01);Dysarthria and anarthria (R47.1)     Assistance Recommended at Discharge  Intermittent Supervision/Assistance  Functional Status Assessment Patient has had a recent decline in their functional status and demonstrates the ability to make significant improvements in function  in a reasonable and predictable amount of time.  Frequency and Duration min 1 x/week  1 week      SLP Evaluation Cognition  Overall Cognitive Status: History of cognitive impairments - at baseline (per chart) Arousal/Alertness: Awake/alert Orientation Level: Disoriented to time Year: 2025 Month: December Day of Week: Correct Attention: Sustained Sustained Attention: Appears intact Selective Attention: Appears intact Awareness:  (further assessment needed)       Comprehension  Auditory Comprehension Overall Auditory Comprehension: Impaired Yes/No Questions: Impaired Complex Questions: 75-100% accurate Other Yes/No Questions Comments`: Moderate Commands: Within Functional Limits Conversation: Simple Interfering Components: Hearing (poor sleep night before) EffectiveTechniques: Extra processing time;Repetition;Increased volume    Expression Expression Primary Mode of Expression: Verbal Verbal Expression Overall Verbal Expression: Impaired Initiation: No impairment Automatic Speech: Name;Social Response Level of Generative/Spontaneous Verbalization: Sentence Repetition: No impairment Naming: No impairment Responsive: Not tested Confrontation: Within functional limits Convergent: Not tested Divergent: Not tested Pragmatics: No impairment Interfering Components: Premorbid deficit Written Expression Written Expression: Not tested   Oral / Motor  Oral Motor/Sensory Function Overall Oral Motor/Sensory Function: Mild impairment Facial ROM: Reduced right Facial Symmetry: Abnormal symmetry right Facial Strength: Reduced right Lingual ROM: Within Functional Limits Lingual Symmetry: Within Functional Limits Lingual Strength: Within Functional Limits Lingual Sensation: Within Functional Limits Velum: Within Functional Limits Mandible: Within Functional Limits Motor Speech Overall Motor Speech: Impaired Respiration: Within functional limits Phonation: Normal Resonance:  Within functional limits Articulation: Impaired Level of Impairment: Conversation Intelligibility: Intelligibility reduced Conversation: 75-100% accurate Motor Planning: Within functional limits Effective Techniques: Slow rate;Over-articulate            Peyton JINNY Rummer 02/02/2024, 1:01 PM

## 2024-02-02 NOTE — Progress Notes (Signed)
" °   02/02/24 2018  BiPAP/CPAP/SIPAP  BiPAP/CPAP/SIPAP Pt Type Adult  Reason BIPAP/CPAP not in use Non-compliant    "

## 2024-02-02 NOTE — Evaluation (Signed)
 Physical Therapy Evaluation Patient Details Name: Raymond Christensen MRN: [id number] DOB: [birth year] Today's Date: 02/02/2024  History of Present Illness  Pt is 84 year old presented to [facility] on  02/01/24 for slurred speech and mild facial droop. Pt with recent (12/25) lt SAH.  MRI without any evidence of new stroke or bleed. Pt also with hyponatremia.  PMH - paroxysmal A-fib,  CAD, CHF, HTN, DM, anxiety, NSTEMI, memory change, OA, skin CA left arm  Clinical Impression  Pt appears not too far from baseline with mobility. Expect being on stretcher x 24 hours caused some unsteadiness without loss of balance. Expect with incr activity his mobility will quickly progress. Recommend return home and to continue his HHPT.         If plan is discharge home, recommend the following: A little help with walking and/or transfers;A little help with bathing/dressing/bathroom;Direct supervision/assist for medications management;Direct supervision/assist for financial management   Can travel by private vehicle        Equipment Recommendations None recommended by PT  Recommendations for Other Services       Functional Status Assessment Patient has had a recent decline in their functional status and demonstrates the ability to make significant improvements in function in a reasonable and predictable amount of time.     Precautions / Restrictions Precautions Precautions: Fall Precaution/Restrictions Comments: History of orthostatic but not present on this admission Restrictions Weight Bearing Restrictions Per Provider Order: No      Mobility  Bed Mobility Overal bed mobility: Needs Assistance Bed Mobility: Supine to Sit, Sit to Supine     Supine to sit: Min assist, HOB elevated Sit to supine: Min assist   General bed mobility comments: Assist to elevate trunk into sitting. Assist to guide trunk and bring legs back up into bed    Transfers Overall transfer level: Needs assistance Equipment used:  Rolling walker (2 wheels) Transfers: Sit to/from Stand Sit to Stand: Contact guard assist                Ambulation/Gait Ambulation/Gait assistance: Contact guard assist Gait Distance (Feet): 170 Feet Assistive device: Rollator (4 wheels) Gait Pattern/deviations: Step-through pattern, Decreased stride length Gait velocity: decr Gait velocity interpretation: 1.31 - 2.62 ft/sec, indicative of limited community ambulator   General Gait Details: Assist for safety.  Stairs            Wheelchair Mobility     Tilt Bed    Modified Rankin (Stroke Patients Only)       Balance Overall balance assessment: Needs assistance Sitting-balance support: No upper extremity supported, Feet supported Sitting balance-Leahy Scale: Good     Standing balance support: No upper extremity supported, During functional activity Standing balance-Leahy Scale: Fair                               Pertinent Vitals/Pain Pain Assessment Pain Assessment: No/denies pain    Home Living Family/patient expects to be discharged to:: Private residence ([place]) Living Arrangements: Spouse/significant other Available Help at Discharge: Family;Available 24 hours/day Type of Home: Independent living facility ([facility]) Home Access: Level entry       Home Layout: One level Home Equipment: Grab bars - tub/shower;Grab bars - toilet;Rollator (4 wheels) Additional Comments: dining room for dinner    Prior Function Prior Level of Function : Needs assist             Mobility Comments: Mod I with rollator  Extremity/Trunk Assessment   Upper Extremity Assessment Upper Extremity Assessment: Defer to OT evaluation    Lower Extremity Assessment Lower Extremity Assessment: Generalized weakness    Cervical / Trunk Assessment Cervical / Trunk Assessment: Normal  Communication   Communication Communication: No apparent difficulties    Cognition Arousal:  Alert Behavior During Therapy: Flat affect   PT - Cognitive impairments: Problem solving                       PT - Cognition Comments: slow processin Following commands: Impaired Following commands impaired: Follows multi-step commands with increased time     Cueing Cueing Techniques: Verbal cues     General Comments General comments (skin integrity, edema, etc.): Supine and sitting BP's 160-170's/60-70's    Exercises     Assessment/Plan    PT Assessment Patient needs continued PT services  PT Problem List Decreased strength;Decreased balance;Decreased mobility       PT Treatment Interventions Gait training;Functional mobility training;DME instruction;Therapeutic activities;Therapeutic exercise;Balance training;Patient/family education    PT Goals (Current goals can be found in the Care Plan section)  Acute Rehab PT Goals Patient Stated Goal: to return home PT Goal Formulation: With patient Time For Goal Achievement: 02/16/24 Potential to Achieve Goals: Good    Frequency Min 2X/week     Co-evaluation               AM-PAC PT 6 Clicks Mobility  Outcome Measure Help needed turning from your back to your side while in a flat bed without using bedrails?: A Little Help needed moving from lying on your back to sitting on the side of a flat bed without using bedrails?: A Little Help needed moving to and from a bed to a chair (including a wheelchair)?: A Little Help needed standing up from a chair using your arms (e.g., wheelchair or bedside chair)?: A Little Help needed to walk in hospital room?: A Little Help needed climbing 3-5 steps with a railing? : A Little 6 Click Score: 18    End of Session Equipment Utilized During Treatment: Gait belt Activity Tolerance: Patient tolerated treatment well Patient left: in bed;with call bell/phone within reach;with family/visitor present Nurse Communication: Mobility status PT Visit Diagnosis: Other  abnormalities of gait and mobility (R26.89);Difficulty in walking, not elsewhere classified (R26.2)    Time: 8586-8564 PT Time Calculation (min) (ACUTE ONLY): 22 min   Charges:   PT Evaluation $PT Eval Moderate Complexity: 1 Mod   PT General Charges $$ ACUTE PT VISIT: 1 Visit         [facility] PT Acute Rehabilitation Services Office [phone]   [facility] ORN [facility] 02/02/2024, 4:55 PM

## 2024-02-03 ENCOUNTER — Ambulatory Visit: Admitting: Family Medicine

## 2024-02-03 DIAGNOSIS — E871 Hypo-osmolality and hyponatremia: Secondary | ICD-10-CM | POA: Diagnosis not present

## 2024-02-03 DIAGNOSIS — T421X5A Adverse effect of iminostilbenes, initial encounter: Secondary | ICD-10-CM | POA: Diagnosis not present

## 2024-02-03 LAB — GLUCOSE, CAPILLARY
Glucose-Capillary: 86 mg/dL (ref 70–99)
Glucose-Capillary: 104 mg/dL — ABNORMAL HIGH (ref 70–99)
Glucose-Capillary: 132 mg/dL — ABNORMAL HIGH (ref 70–99)
Glucose-Capillary: 120 mg/dL — ABNORMAL HIGH (ref 70–99)

## 2024-02-03 LAB — CBC
HCT: 37.1 % — ABNORMAL LOW (ref 39.0–52.0)
Hemoglobin: 12.6 g/dL — ABNORMAL LOW (ref 13.0–17.0)
MCH: 28.6 pg (ref 26.0–34.0)
MCHC: 34 g/dL (ref 30.0–36.0)
MCV: 84.1 fL (ref 80.0–100.0)
Platelets: 176 K/uL (ref 150–400)
RBC: 4.41 MIL/uL (ref 4.22–5.81)
RDW: 13.6 % (ref 11.5–15.5)
WBC: 8.2 K/uL (ref 4.0–10.5)
nRBC: 0 % (ref 0.0–0.2)

## 2024-02-03 LAB — OSMOLALITY, URINE: Osmolality, Ur: 287 mosm/kg — ABNORMAL LOW (ref 300–900)

## 2024-02-03 LAB — CREATININE, URINE, RANDOM: Creatinine, Urine: 28 mg/dL

## 2024-02-03 LAB — BASIC METABOLIC PANEL WITH GFR
Anion gap: 7 (ref 5–15)
BUN: 9 mg/dL (ref 8–23)
CO2: 27 mmol/L (ref 22–32)
Calcium: 8.8 mg/dL — ABNORMAL LOW (ref 8.9–10.3)
Chloride: 95 mmol/L — ABNORMAL LOW (ref 98–111)
Creatinine, Ser: 0.81 mg/dL (ref 0.61–1.24)
GFR, Estimated: >60 mL/min
Glucose, Bld: 81 mg/dL (ref 70–99)
Potassium: 5 mmol/L (ref 3.5–5.1)
Sodium: 129 mmol/L — ABNORMAL LOW (ref 135–145)

## 2024-02-03 LAB — SODIUM, URINE, RANDOM: Sodium, Ur: 87 mmol/L

## 2024-02-03 LAB — OSMOLALITY: Osmolality: 269 mosm/kg — ABNORMAL LOW (ref 275–295)

## 2024-02-03 LAB — URIC ACID: Uric Acid, Serum: 2.9 mg/dL — ABNORMAL LOW (ref 3.7–8.6)

## 2024-02-03 MED ORDER — AMLODIPINE BESYLATE 10 MG PO TABS
10.0000 mg | ORAL_TABLET | Freq: Every day | ORAL | Status: DC
  Administered 2024-02-03 – 2024-02-04 (×2): 10 mg via ORAL
  Filled 2024-02-03 (×2): qty 1

## 2024-02-03 MED ORDER — LACTATED RINGERS IV SOLN: INTRAVENOUS | Status: AC

## 2024-02-03 MED ORDER — SODIUM ZIRCONIUM CYCLOSILICATE 10 G PO PACK
10.0000 g | PACK | Freq: Once | ORAL | Status: AC
  Administered 2024-02-03: 10 g via ORAL
  Filled 2024-02-03: qty 1

## 2024-02-03 NOTE — Progress Notes (Signed)
 NEUROLOGY CONSULT FOLLOW UP NOTE   Date of service: February 03, 2024 Patient Name: Raymond Christensen MRN:  [id number] DOB:  [birth year]  Interval Hx/subjective  Patient is seen in his room with no family at the bedside.  He has remained hemodynamically stable and afebrile overnight.  Patient's wife reports a concern that Depakote from an outpatient pharmacy was delivered to patient's bedside and that he took at least 1 dose.  Patient denies taking any medications other than what was given to him by his nurse and states that the Depakote was given to the pharmacist.  Vitals   Vitals:   02/03/24 0656 02/03/24 0657 02/03/24 0658 02/03/24 0758  BP:    (!) 152/75  Pulse: 67 67 64 66  Resp: 16 19 15 16   Temp:    97.6 F (36.4 C)  TempSrc:    Oral  SpO2: 94% 95% 95% 96%  Weight:      Height:         Body mass index is 25.46 kg/m.  Physical Exam   GEN: Awake alert in no distress HEENT: Normocephalic atraumatic Lungs: Respirations regular and unlabored on room air  NEURO:  Mental Status: AA&Ox3, able to give clear and coherent history of present illness Speech/Language: speech is without dysarthria or aphasia.    Cranial Nerves:  II: PERRL.  III, IV, VI: EOMI. Eyelids elevate symmetrically.  V: Sensation is intact to light touch and symmetrical to face.  VII: Very subtle right facial droop VIII: hearing intact to voice. IX, X: Phonation is normal.  KP:Dynloizm shrug 5/5. XII: tongue is midline without fasciculations. Motor: Able to move all 4 extremities with symmetrical antigravity strength Tone: is normal and bulk is normal Sensation- Intact to light touch bilaterally.  Coordination: FTN intact bilaterally Gait- deferred   Medications Current Medications[1]  Labs and Diagnostic Imaging   CBC:  Recent Labs  Lab 02/01/24 1800 02/03/24 0240  WBC 8.0 8.2  NEUTROABS 5.0  --   HGB 12.9* 12.6*  HCT 39.0 37.1*  MCV 85.5 84.1  PLT 177 176    Basic Metabolic Panel:   Lab Results  Component Value Date   NA 129 (L) 02/03/2024   K 5.0 02/03/2024   CO2 27 02/03/2024   GLUCOSE 81 02/03/2024   BUN 9 02/03/2024   CREATININE 0.81 02/03/2024   CALCIUM  8.8 (L) 02/03/2024   GFRNONAA >60 02/03/2024   GFRAA 49 (L) 01/13/2020   Lipid Panel:  Lab Results  Component Value Date   LDLCALC 48 12/22/2023   HgbA1c:  Lab Results  Component Value Date   HGBA1C 5.5 12/22/2023   Urine Drug Screen:     Component Value Date/Time   LABOPIA NONE DETECTED 12/26/2023 1221   COCAINSCRNUR NONE DETECTED 12/26/2023 1221   LABBENZ NONE DETECTED 12/26/2023 1221   AMPHETMU NONE DETECTED 12/26/2023 1221   THCU NONE DETECTED 12/26/2023 1221   LABBARB NONE DETECTED 12/26/2023 1221    Alcohol Level     Component Value Date/Time   ETH <15 02/01/2024 1800   INR  Lab Results  Component Value Date   INR 0.9 02/01/2024   APTT  Lab Results  Component Value Date   APTT 35 02/01/2024   CT head without contrast personally reviewed-negative for acute process  MRI brain with loss of flow-void within left transverse and sigmoid sinuses, interval improvement and evolution of subarachnoid hemorrhage and marginally improved thin subdural hematoma overlying left parietal lobe  CT venogram no dural venous sinus thrombosis, unchanged  small subdural hematoma over left cerebral convexity and small volume residual subarachnoid hemorrhage  Routine EEG - Intermittent slow, generalized This study is suggestive of mild generalized cerebral dysfunction ( encephalopathy). No seizures or epileptiform discharges were seen throughout the recording.  Assessment   Raymond Christensen is a 84 y.o. male past history of paroxysmal A-fib on Eliquis  in the past but had a fall with traumatic SDH/SAH/IPH and Eliquis  was held.  He was brought in as a code stroke yesterday for slurred speech and mild facial droop.  Still has persistent right facial droop. Question recrudescence of old stroke symptoms  versus new stroke versus seizure. Initial plan was not to obtain any further imaging but he reports that he still does not feel back to his baseline although on exam he only has a mild right facial droop-further imaging and EEG will be helpful.  Had electrolyte derangements that are being evaluated by the hospitalist. I suspect that the hyponatremia may also be from oxcarbazepine -has Keppra  was being tapered down due to side effects of irritation and agitation and he was being treated with oxcarbazepine . MRI reveals no acute infarct, but there was some concern for cerebral venous sinus thrombosis, but CT venogram was negative. EEG was negative for seizures or epileptiform discharges.  Will continue Depakote and plan to monitor level in 4 days.   Recommendations  -Continue valproate 500 mg twice daily - Check valproate level in 4 days - Follow-up with PCP 3 to 5 days after discharge - Keep follow-up appointment with Dr. Gregg with neuropsychological evaluation  Will be available as needed.  Plan discussed with Dr. Dennise. ______________________________________________________________________ Patient seen by NP and then by MD, MD to edit note as needed. Cortney E Everitt Clint Kill , MSN, AGACNP-BC [facility] See Amion for schedule and pager information 02/03/2024 10:15 AM   Attending Neurohospitalist Addendum Patient seen and examined with APP/Resident. Agree with the history and physical as documented above. Agree with the plan as documented, which I helped formulate. I have independently reviewed the chart, obtained history, review of systems and examined the patient.I have personally reviewed pertinent head/neck/spine imaging (CT/MRI). Please feel free to call with any questions.  -- Eligio Lav, MD Neurologist [facility] Pager: [phone]      [1]  Current Facility-Administered Medications:    acetaminophen  (TYLENOL ) tablet 650 mg, 650 mg, Oral, Q6H PRN  **OR** acetaminophen  (TYLENOL ) suppository 650 mg, 650 mg, Rectal, Q6H PRN, Alfornia Madison, MD   amLODipine  (NORVASC ) tablet 10 mg, 10 mg, Oral, Daily, Dennise, Prashant K, MD   atorvastatin  (LIPITOR ) tablet 80 mg, 80 mg, Oral, Daily, Rathore, Vasundhra, MD, 80 mg at 02/03/24 9146   docusate sodium  (COLACE) capsule 100-200 mg, 100-200 mg, Oral, QHS PRN, Rathore, Vasundhra, MD   ezetimibe  (ZETIA ) tablet 10 mg, 10 mg, Oral, Daily, Rathore, Vasundhra, MD, 10 mg at 02/03/24 9146   famotidine  (PEPCID ) tablet 20 mg, 20 mg, Oral, Daily, Rathore, Vasundhra, MD, 20 mg at 02/03/24 9146   finasteride  (PROSCAR ) tablet 5 mg, 5 mg, Oral, Daily, Rathore, Vasundhra, MD, 5 mg at 02/03/24 0853   hydrALAZINE  (APRESOLINE ) injection 5 mg, 5 mg, Intravenous, Q4H PRN, Rathore, Vasundhra, MD, 5 mg at 02/03/24 9557   lactated ringers  infusion, , Intravenous, Continuous, Dennise Lavada POUR, MD, Last Rate: 75 mL/hr at 02/03/24 0915, New Bag at 02/03/24 0915   levothyroxine  (SYNTHROID ) tablet 75 mcg, 75 mcg, Oral, Q0600, Rathore, Vasundhra, MD, 75 mcg at 02/03/24 0533   liothyronine  (CYTOMEL ) tablet 10 mcg, 10 mcg,  Oral, Daily, Rathore, Vasundhra, MD, 10 mcg at 02/03/24 9145   LORazepam  (ATIVAN ) tablet 0.5 mg, 0.5 mg, Oral, BID, Rathore, Vasundhra, MD, 0.5 mg at 02/03/24 0854   PARoxetine  (PAXIL ) tablet 10 mg, 10 mg, Oral, Daily, Rathore, Vasundhra, MD, 10 mg at 02/03/24 0854   sertraline  (ZOLOFT ) tablet 50 mg, 50 mg, Oral, Daily, Rathore, Vasundhra, MD, 50 mg at 02/03/24 9146   valproic  acid (DEPAKENE ) 250 MG capsule 500 mg, 500 mg, Oral, BID, Zondra Lawlor, MD, 500 mg at 02/03/24 [phone]

## 2024-02-03 NOTE — Progress Notes (Signed)
 " PROGRESS NOTE    Raymond Christensen  FMW:[id number] DOB: 06-09-1940 DOA: 02/01/2024 PCP: Frann Mabel Mt, DO   Brief Narrative: 84 year old with past medical history significant for CAD status post CABG, carotid artery disease status post left CEA 2016, CHF, mitral regurgitation status post TEE or with MitraClip 2021, A-fib, hypertension, hyperlipidemia, hypothyroidism, diabetes type 2, depression, anxiety, OSA on CPAP, GERD, BPH overreactive bladder who was admitted to the hospital last month from 12/12 until 12/29 for traumatic left temporal intracranial hemorrhage, subarachnoid hemorrhage, subdural hemorrhage, expressive aphasia and seizure.  He was started on Keppra  and Eliquis  was discontinued.  Home Wellbutrin  was discontinued due to seizure.  Felt to have some melena with positive FOBT GI recommended monitoring.  Started on midodrine  for orthostatic hypotension.  Patient presented today via EMS for sudden onset of slurred speech and right facial droop.  Blood pressure was elevated 198/82.  CT head showed prior ICH/SAH/S [facility] which has improved/stable no acute finding.  Neurology suspecting recrudescence of prior ICH SAH versus possible focal seizure.  Plan to obtain EEG.   Assessment & Plan:   1-Acute onset of slurred speech and mild right facial droop:  Patient presents with acute onset of slurred speech, per wife patient speech was back to baseline prior to discharge last time.  Recent history of subarachnoid hemorrhage, CT head unremarkable, MRI brain had raised question about dural venous sinus thrombosis but ruled out on CT venogram.  Unremarkable EEG, neurology on board.  PT OT and speech following.  Patient has improved patient feeling well.  Continue supportive care.  Hyponatremia: secondary to dehydration.  He has received 3 L of fluid since admission with improvement in sodium, will order LR for another 1 L.  Serum and urine electrolytes ordered.  Urinary frequency: UA negative  for UTI, resolved frequency monitor.  Could be due to him getting IV fluids.  Prior ICH/SAH/SDH - History of seizure  -neurology following, EEG negative, on valproic  acid/Depakene  which will be continued.  Paroxysmal A-fib - CAD  - Heart rate in the 50s, currently holding beta-blockers, No anti-coagulation due to recent brain bleed.  Hypertension;.  On Norvasc  dose increased along with as needed hydralazine .  Beta-blocker held due to resting bradycardia.  Hyperlipidemia  Continue with Zetia  and Lipitor   Hypothyroidism  Continue Synthroid   Chronic diastolic heart failure  Mitral regurgitation status post TER with MitraClip 2021, currently dehydrated  Depression/anxiety   Continue sertraline  paroxetine  and lorazepam   OSA continue CPAP at bedtime  GERD continue Pepcid   BPH continue finasteride    History of Orthostatic Hypotension.  Last admission.  Discharge on low dose midodrine  2.5 BID.  Midodrine  was discontinue as out patient be\cause of elevated BP Continue to hold midodrine ,.   Estimated body mass index is 25.46 kg/m as calculated from the following:   Height as of this encounter: 5' 9.6 (1.768 m).   Weight as of this encounter: 79.6 kg.   DVT prophylaxis: SCD Code Status: Full code Family Communication: Wife updated over the phone [phone] on 02/03/2024 in detail Disposition Plan:  Status is: inpt  Consultants:  Neurology  Procedures:    Antimicrobials:    Subjective:  Patient in bed, appears comfortable, denies any headache, no fever, no chest pain or pressure, no shortness of breath , no abdominal pain. No new focal weakness.   Objective: Vitals:   02/03/24 0656 02/03/24 0657 02/03/24 0658 02/03/24 0758  BP:    (!) 152/75  Pulse: 67 67 64 66  Resp: 16 19  15 16  Temp:    97.6 F (36.4 C)  TempSrc:    Oral  SpO2: 94% 95% 95% 96%  Weight:      Height:        Intake/Output Summary (Last 24 hours) at 02/03/2024 0850 Last data filed at 02/03/2024  0600 Gross per 24 hour  Intake --  Output 2100 ml  Net -2100 ml   Filed Weights   02/01/24 1733 02/01/24 1744  Weight: 95.8 kg 79.6 kg    Examination:  Awake Alert, No new F.N deficits, Normal affect [place].AT,PERRAL Supple Neck, No JVD,   Symmetrical Chest wall movement, Good air movement bilaterally, CTAB RRR,No Gallops, Rubs or new Murmurs,  +ve B.Sounds, Abd Soft, No tenderness,   No Cyanosis, Clubbing or edema     Data Reviewed: I have personally reviewed following labs and imaging studies     Data Review:   Patient Lines/Drains/Airways Status     Active Line/Drains/Airways     Name Placement date Placement time Site Days   Peripheral IV 02/01/24 20 G Posterior;Right Hand 02/01/24  1807  Hand  2   Wound 12/26/23 1700 Other (Comment) Pelvis Anterior;Left 12/26/23  1700  Pelvis  39   Wound 12/26/23 2000 Traumatic Arm Anterior;Lower;Proximal;Right 12/26/23  2000  Arm  39             Inpatient Medications  Scheduled Meds:   amLODipine   10 mg Oral Daily   atorvastatin   80 mg Oral Daily   ezetimibe   10 mg Oral Daily   famotidine   20 mg Oral Daily   finasteride   5 mg Oral Daily   levothyroxine   75 mcg Oral Q0600   liothyronine   10 mcg Oral Daily   LORazepam   0.5 mg Oral BID   PARoxetine   10 mg Oral Daily   sertraline   50 mg Oral Daily   valproic  acid  500 mg Oral BID   Continuous Infusions:  lactated ringers      PRN Meds:.acetaminophen  **OR** acetaminophen , docusate sodium , hydrALAZINE   DVT Prophylaxis  Place and maintain sequential compression device Start: 02/02/24 1402 SCDs Start: 02/01/24 2356   Recent Labs  Lab 02/01/24 1743 02/01/24 1800 02/03/24 0240  WBC  --  8.0 8.2  HGB 14.6 12.9* 12.6*  HCT 43.0 39.0 37.1*  PLT  --  177 176  MCV  --  85.5 84.1  MCH  --  28.3 28.6  MCHC  --  33.1 34.0  RDW  --  13.5 13.6  LYMPHSABS  --  2.1  --   MONOABS  --  0.8  --   EOSABS  --  0.1  --   BASOSABS  --  0.0  --     Recent Labs  Lab  02/01/24 1743 02/01/24 1800 02/02/24 0202 02/02/24 0959 02/02/24 1354 02/02/24 2011 02/03/24 0240  NA 123* 124* 126* 127* 127* 128* 129*  K 5.5* 4.3  --   --   --  4.4 5.0  CL 92* 91*  --   --   --  93* 95*  CO2  --  22  --   --   --  28 27  ANIONGAP  --  11  --   --   --  7 7  GLUCOSE 99 100*  --   --   --  106* 81  BUN 11 9  --   --   --  8 9  CREATININE 0.70 0.67  --   --   --  0.72 0.81  AST  --  30  --   --   --   --   --   ALT  --  33  --   --   --   --   --   ALKPHOS  --  90  --   --   --   --   --   BILITOT  --  0.4  --   --   --   --   --   ALBUMIN   --  3.8  --   --   --   --   --   INR  --  0.9  --   --   --   --   --   CALCIUM   --  8.8*  --   --   --  8.4* 8.8*      Recent Labs  Lab 02/01/24 1800 02/02/24 2011 02/03/24 0240  INR 0.9  --   --   CALCIUM  8.8* 8.4* 8.8*    --------------------------------------------------------------------------------------------------------------- Lab Results  Component Value Date   CHOL 109 12/22/2023   HDL 42.90 12/22/2023   LDLCALC 48 12/22/2023   LDLDIRECT 65.0 08/31/2021   TRIG 88.0 12/22/2023   CHOLHDL 3 12/22/2023    Lab Results  Component Value Date   HGBA1C 5.5 12/22/2023   No results for input(s): TSH, T4TOTAL, FREET4, T3FREE, THYROIDAB in the last 72 hours. No results for input(s): VITAMINB12, FOLATE, FERRITIN, TIBC, IRON, RETICCTPCT in the last 72 hours. ------------------------------------------------------------------------------------------------------------------ Cardiac Enzymes No results for input(s): CKMB, TROPONINI, MYOGLOBIN in the last 168 hours.  Invalid input(s): CK  Micro Results No results found for this or any previous visit (from the past 240 hours).  Radiology Reports  EEG adult Result Date: 02/02/2024 Shelton Arlin KIDD, MD     02/02/2024  4:19 PM Patient Name: Raymond Christensen MRN: [id number] Epilepsy Attending: Arlin KIDD Shelton Referring  Physician/Provider: Alfornia Madison, MD Date: 02/02/2024 Duration: 22.59 mins Patient history: 84 y.o. male with slurred speech and a mild right facial droop. EEG to evaluate for seizure  Level of alertness: Awake, asleep AEDs during EEG study: LEV, OXC, Ativan  Technical aspects: This EEG study was done with scalp electrodes positioned according to the 10-20 International system of electrode placement. Electrical activity was reviewed with band pass filter of 1-70Hz , sensitivity of 7 uV/mm, display speed of 70mm/sec with a 60Hz  notched filter applied as appropriate. EEG data were recorded continuously and digitally stored.  Video monitoring was available and reviewed as appropriate. Description: The posterior dominant rhythm consists of 7.5 Hz activity of moderate voltage (25-35 uV) seen predominantly in posterior head regions, symmetric and reactive to eye opening and eye closing. Sleep was characterized by vertex waves, sleep spindles (12 to 14 Hz), maximal frontocentral region. There is intermittent generalized 3 to 6 Hz theta-delta slowing. Hyperventilation and photic stimulation were not performed.   ABNORMALITY - Intermittent slow, generalized IMPRESSION: This study is suggestive of mild generalized cerebral dysfunction ( encephalopathy). No seizures or epileptiform discharges were seen throughout the recording. Arlin KIDD Shelton   CT VENOGRAM HEAD Result Date: 02/02/2024 EXAM: CT VENOGRAM WITH CONTAST 02/02/2024 12:55:28 PM TECHNIQUE: CT venogram of the head/brain was performed with the administration of intravenous contrast. 75 mL of iohexol  (OMNIPAQUE ) 350 MG/ML injection was administered. Multiplanar reformatted images are provided for review. MIP images are provided for review. Automated exposure control, iterative reconstruction, and/or weight based adjustment of the mA/kV was utilized to reduce the radiation dose to  as low as reasonably achievable. COMPARISON: Head CT 02/01/2024 and MRI 02/02/2024.  CLINICAL HISTORY: Dural venous sinus thrombosis suspected. FINDINGS: BRAIN/VENTRICLES: A small subdural hematoma over the left cerebral convexity measuring up to 3 mm in thickness and small volume residual subarachnoid hemorrhage most conspicuously in the left sylvian fissure and left parietooccipital sulci are unchanged. There is no midline shift or other significant mass effect. No new intracranial hemorrhage, acute infarct, mass, or hydrocephalus is evident. There is mild cerebral atrophy. Cerebral white matter hypodensities are nonspecific but compatible with mild chronic small vessel ischemic disease. Calcified atherosclerosis at the skull base. ORBITS: Left cataract extraction. SINUSES AND MASTOIDS: No acute abnormality. SOFT TISSUES AND SKULL: No acute abnormality. CT VENOGRAM: The dural venous sinuses are patent without evidence of thrombus or significant stenosis. Abnormal appearance of the left transverse and sigmoid sinuses on MRI presumably reflected slow flow. IMPRESSION: 1. No dural venous sinus thrombosis. 2. Unchanged small subdural hematoma over the left cerebral convexity and small volume residual subarachnoid hemorrhage. Electronically signed by: Dasie Hamburg MD 02/02/2024 01:12 PM EST RP Workstation: [id number]   MR BRAIN WO CONTRAST Result Date: 02/02/2024 EXAM: MRI BRAIN WITHOUT CONTRAST 02/02/2024 09:35:54 AM TECHNIQUE: Multiplanar multisequence MRI of the head/brain was performed without the administration of intravenous contrast. COMPARISON: MRI of the head dated 01/02/2024. CLINICAL HISTORY: Stroke, follow up. FINDINGS: BRAIN AND VENTRICLES: No acute infarct. Significant interval improvement and evolution of subarachnoid hemorrhage overlying the left cerebral hemisphere. Moderate hemosiderin staining throughout the cerebral sulci bilaterally, significantly worse on the left. A thin subdural hematoma overlying the left parietal lobe has also marginally improved. No mass. No midline shift.  No hydrocephalus. Loss of the flow void present within the left transverse and sigmoid sinuses, concerning for sinus thrombosis. The sella is unremarkable. ORBITS: No significant abnormality. SINUSES AND MASTOIDS: Mild mucosal disease within the floors of the maxillary sinuses. BONES AND SOFT TISSUES: Normal marrow signal. No soft tissue abnormality. IMPRESSION: 1. Loss of the flow void within the left transverse and sigmoid sinuses, suspicious for dural venous sinus thrombosis (differential includes slow flow/flow-related artifact); recommend confirmation with MRV or CTV and correlation with clinical/laboratory risk factors. 2. Significant interval improvement and evolution of subarachnoid hemorrhage overlying the left cerebral hemisphere; in this age group and distribution, differential considerations include traumatic subarachnoid hemorrhage, cerebral amyloid angiopathy, and hemorrhage related to cerebral venous thrombosis; follow-up imaging as clinically indicated for recurrent/worsening symptoms. 3. Moderate hemosiderin staining throughout the cerebral sulci bilaterally, significantly worse on the left, compatible with sequelae of prior subarachnoid hemorrhage/superficial siderosis. 4. Marginally improved thin subdural hematoma overlying the left parietal lobe, most commonly posttraumatic; follow-up head CT or MRI if clinical status changes or symptoms persist. 5. No restricted diffusion to indicate acute cerebral infarction. Electronically signed by: Evalene Coho MD 02/02/2024 10:04 AM EST RP Workstation: [id number]   CT HEAD CODE STROKE WO CONTRAST Result Date: 02/01/2024 EXAM: CT HEAD WITHOUT CONTRAST 02/01/2024 05:39:17 PM TECHNIQUE: CT of the head was performed without the administration of intravenous contrast. Automated exposure control, iterative reconstruction, and/or weight based adjustment of the mA/kV was utilized to reduce the radiation dose to as low as reasonably achievable. COMPARISON: CT  head without contrast and MR head without contrast 01/02/2024. CLINICAL HISTORY: Neuro deficit, acute, stroke suspected. FINDINGS: BRAIN AND VENTRICLES: Trace subarachnoid hemorrhage within left temporal, parietal and occipital lobes. Decreased compared to prior study. Resolving left temporal lobe parenchymal hemorrhage. Stable 2 mm left parietal subdural hematoma. Generalized cerebral atrophy is stable. Patchy white  matter low-density suggestive of chronic small vessel ischemic disease, stable. No evidence of acute infarct. No hydrocephalus. No mass effect or midline shift. ORBITS: No acute abnormality. SINUSES: No acute abnormality. SOFT TISSUES AND SKULL: No acute soft tissue abnormality. No skull fracture. IMPRESSION: 1. Trace subarachnoid hemorrhage within left temporal, parietal, and occipital lobes, decreased compared to prior study. 2. Resolving left temporal lobe parenchymal hemorrhage. 3. Stable 2 mm left parietal subdural hematoma. 4. No new or enlarging hemorrhage. No acute infarct. Electronically signed by: Lonni Necessary MD 02/01/2024 05:44 PM EST RP Workstation: [id number]     Signature  -   Lavada Stank M.D on 02/03/2024 at 8:50 AM   -  To page go to [email or web address]    "

## 2024-02-03 NOTE — Progress Notes (Signed)
" °   02/03/24 2029  BiPAP/CPAP/SIPAP  BiPAP/CPAP/SIPAP Pt Type Adult  Reason BIPAP/CPAP not in use Non-compliant    "

## 2024-02-03 NOTE — Evaluation (Signed)
 Occupational Therapy Evaluation Patient Details Name: Raymond Christensen MRN: [id number] DOB: October 10, 1940 Today's Date: 02/03/2024   History of Present Illness   Pt is 84 year old presented to [facility] on  02/01/24 for slurred speech and mild facial droop. Pt with recent (12/25) lt SAH.  MRI without any evidence of new stroke or bleed. Pt also with hyponatremia.  PMH - paroxysmal A-fib,  CAD, CHF, HTN, DM, anxiety, NSTEMI, memory change, OA, skin CA left arm     Clinical Impressions PTA, pt lives with wife at ILF, Modified Independent with ADLs and mobility using a rollator. Pt presents now near baseline with minor deficits in dynamic standing balance. Pt able to mobilize in room using RW with CGA-Supervision and manage all standing ADLs with no more than CGA. Pt reports feeling close to normal and hopeful to DC home soon. Will follow while admitted but anticipate no OT needs upon DC.     If plan is discharge home, recommend the following:   A little help with bathing/dressing/bathroom;Assistance with cooking/housework     Functional Status Assessment   Patient has had a recent decline in their functional status and demonstrates the ability to make significant improvements in function in a reasonable and predictable amount of time.     Equipment Recommendations   None recommended by OT     Recommendations for Other Services         Precautions/Restrictions   Precautions Precautions: Fall Recall of Precautions/Restrictions: Intact Precaution/Restrictions Comments: History of orthostatic but not present on this admission Restrictions Weight Bearing Restrictions Per Provider Order: No     Mobility Bed Mobility Overal bed mobility: Needs Assistance Bed Mobility: Supine to Sit     Supine to sit: Min assist, HOB elevated     General bed mobility comments: light Min A to lift trunk/shift weight to midline    Transfers Overall transfer level: Needs assistance Equipment  used: Rolling walker (2 wheels) Transfers: Sit to/from Stand Sit to Stand: Contact guard assist           General transfer comment: to stand from bedside with RW      Balance Overall balance assessment: Needs assistance Sitting-balance support: No upper extremity supported, Feet supported Sitting balance-Leahy Scale: Good     Standing balance support: No upper extremity supported, During functional activity Standing balance-Leahy Scale: Fair                             ADL either performed or assessed with clinical judgement   ADL Overall ADL's : Needs assistance/impaired Eating/Feeding: Independent;Sitting   Grooming: Supervision/safety;Sitting;Oral care;Wash/dry face   Upper Body Bathing: Set up;Sitting   Lower Body Bathing: Contact guard assist;Sitting/lateral leans;Sit to/from stand   Upper Body Dressing : Set up;Sitting   Lower Body Dressing: Contact guard assist;Sitting/lateral leans;Sit to/from stand   Toilet Transfer: Supervision/safety;Contact guard assist;Ambulation;Rolling walker (2 wheels)   Toileting- Clothing Manipulation and Hygiene: Supervision/safety;Sitting/lateral lean;Sit to/from stand       Functional mobility during ADLs: Supervision/safety;Contact guard assist;Rolling walker (2 wheels)       Vision Baseline Vision/History: 1 Wears glasses Ability to See in Adequate Light: 0 Adequate Patient Visual Report: No change from baseline Vision Assessment?: No apparent visual deficits     Perception         Praxis         Pertinent Vitals/Pain Pain Assessment Pain Assessment: No/denies pain     Extremity/Trunk Assessment Upper Extremity  Assessment Upper Extremity Assessment: Overall WFL for tasks assessed;Right hand dominant   Lower Extremity Assessment Lower Extremity Assessment: Defer to PT evaluation   Cervical / Trunk Assessment Cervical / Trunk Assessment: Normal   Communication Communication Communication:  Impaired Factors Affecting Communication: Difficulty expressing self   Cognition Arousal: Alert Behavior During Therapy: Flat affect, WFL for tasks assessed/performed Cognition: No apparent impairments             OT - Cognition Comments: slower response time but overall appropriate throughout session, demo humor                 Following commands: Impaired Following commands impaired: Follows one step commands with increased time, Follows multi-step commands with increased time     Cueing  General Comments   Cueing Techniques: Verbal cues      Exercises     Shoulder Instructions      Home Living Family/patient expects to be discharged to:: Private residence ([place]) Living Arrangements: Spouse/significant other Available Help at Discharge: Family;Available 24 hours/day Type of Home: Independent living facility Home Access: Level entry     Home Layout: One level     Bathroom Shower/Tub: Producer, Television/film/video: Handicapped height Bathroom Accessibility: Yes How Accessible: Accessible via walker Home Equipment: Grab bars - tub/shower;Grab bars - toilet;Rollator (4 wheels)          Prior Functioning/Environment Prior Level of Function : Needs assist             Mobility Comments: Mod I with rollator ADLs Comments: Mod I for ADLs, uses shower chair. walks to dining hall for meals    OT Problem List: Impaired balance (sitting and/or standing);Decreased activity tolerance   OT Treatment/Interventions: Self-care/ADL training;DME and/or AE instruction;Balance training;Patient/family education;Therapeutic exercise;Energy conservation;Therapeutic activities      OT Goals(Current goals can be found in the care plan section)   Acute Rehab OT Goals Patient Stated Goal: home soon OT Goal Formulation: With patient Time For Goal Achievement: 02/17/24 Potential to Achieve Goals: Good ADL Goals Pt Will Perform Lower Body Bathing: with  modified independence;sit to/from stand;sitting/lateral leans Pt Will Perform Lower Body Dressing: with modified independence;sit to/from stand;sitting/lateral leans Pt Will Transfer to Toilet: with modified independence;ambulating Additional ADL Goal #1: Pt to demo ability to reach and gather ADL/IADL items using walker with Mod I and without LOB   OT Frequency:  Min 2X/week    Co-evaluation              AM-PAC OT 6 Clicks Daily Activity     Outcome Measure Help from another person eating meals?: None Help from another person taking care of personal grooming?: A Little Help from another person toileting, which includes using toliet, bedpan, or urinal?: A Little Help from another person bathing (including washing, rinsing, drying)?: A Little Help from another person to put on and taking off regular upper body clothing?: A Little Help from another person to put on and taking off regular lower body clothing?: A Little 6 Click Score: 19   End of Session Equipment Utilized During Treatment: Gait belt;Rolling walker (2 wheels) Nurse Communication: Mobility status  Activity Tolerance: Patient tolerated treatment well Patient left: in chair;with call bell/phone within reach;with chair alarm set  OT Visit Diagnosis: Unsteadiness on feet (R26.81)                Time: 9245-9174 OT Time Calculation (min): 31 min Charges:  OT General Charges $OT Visit: 1 Visit OT Evaluation $  OT Eval Low Complexity: 1 Low OT Treatments $Self Care/Home Management : 8-22 mins  Mliss NOVAK, OTR/L Acute Rehab Services Office: [phone]   Mliss Fish 02/03/2024, 8:52 AM

## 2024-02-03 NOTE — Plan of Care (Signed)

## 2024-02-03 NOTE — Progress Notes (Signed)
 Physical Therapy Treatment Patient Details Name: Raymond Christensen MRN: [id number] DOB: Feb 22, 1940 Today's Date: 02/03/2024   History of Present Illness Pt is 84 year old presented to [facility] on  02/01/24 for slurred speech and mild facial droop. Pt with recent (12/25) lt SAH.  MRI without any evidence of new stroke or bleed. Pt also with hyponatremia.  PMH - paroxysmal A-fib,  CAD, CHF, HTN, DM, anxiety, NSTEMI, memory change, OA, skin CA left arm    PT Comments  Pt received in the recliner and agreeable to session. Pt able to increase gait distance with supervision for safety with rollator support. Pt demonstrates good stability with rollator, but requires cues for use of brakes during transfers. Education provided on navigating rollator in tight spaces, such as bathroom, with pt verbalizing understanding. Pt reports some concern over standing, but is able to perform serial STS from low surface without physical assist. Pt continues to benefit from PT services to progress toward functional mobility goals.    If plan is discharge home, recommend the following: A little help with walking and/or transfers;A little help with bathing/dressing/bathroom;Direct supervision/assist for medications management;Direct supervision/assist for financial management   Can travel by private vehicle        Equipment Recommendations  None recommended by PT    Recommendations for Other Services       Precautions / Restrictions Precautions Precautions: Fall Recall of Precautions/Restrictions: Intact Precaution/Restrictions Comments: History of orthostatic but not present on this admission Restrictions Weight Bearing Restrictions Per Provider Order: No     Mobility  Bed Mobility Overal bed mobility: Needs Assistance Bed Mobility: Sit to Supine       Sit to supine: Min assist   General bed mobility comments: Pt able to lift BLE back to EOB with increased effort and time for problem solving technique. Light  min A for repositioning and cues for use of rails for supine scoot to [facility]    Transfers Overall transfer level: Needs assistance Equipment used: Rollator (4 wheels) Transfers: Sit to/from Stand Sit to Stand: Contact guard assist           General transfer comment: from recliner and low EOB with CGA for safety and cues for anterior weight shift and hand placement    Ambulation/Gait Ambulation/Gait assistance: Supervision, Contact guard assist Gait Distance (Feet): 250 Feet Assistive device: Rollator (4 wheels) Gait Pattern/deviations: Step-through pattern, Decreased stride length Gait velocity: decr     General Gait Details: steady gait with rollator support. low foot clearance   Stairs             Wheelchair Mobility     Tilt Bed    Modified Rankin (Stroke Patients Only)       Balance Overall balance assessment: Needs assistance Sitting-balance support: No upper extremity supported, Feet supported Sitting balance-Leahy Scale: Good     Standing balance support: During functional activity, Bilateral upper extremity supported Standing balance-Leahy Scale: Fair Standing balance comment: with rollator support                            Communication Communication Communication: Impaired Factors Affecting Communication: Difficulty expressing self  Cognition Arousal: Alert Behavior During Therapy: WFL for tasks assessed/performed   PT - Cognitive impairments: Problem solving, Sequencing                       PT - Cognition Comments: slow processing Following commands: Impaired Following commands impaired:  Follows one step commands with increased time, Follows multi-step commands with increased time    Cueing Cueing Techniques: Verbal cues  Exercises Other Exercises Other Exercises: x5 serial STS    General Comments        Pertinent Vitals/Pain Pain Assessment Pain Assessment: No/denies pain     PT Goals (current goals  can now be found in the care plan section) Acute Rehab PT Goals Patient Stated Goal: to return home PT Goal Formulation: With patient Time For Goal Achievement: 02/16/24 Progress towards PT goals: Progressing toward goals    Frequency    Min 2X/week       AM-PAC PT 6 Clicks Mobility   Outcome Measure  Help needed turning from your back to your side while in a flat bed without using bedrails?: A Little Help needed moving from lying on your back to sitting on the side of a flat bed without using bedrails?: A Little Help needed moving to and from a bed to a chair (including a wheelchair)?: A Little Help needed standing up from a chair using your arms (e.g., wheelchair or bedside chair)?: A Little Help needed to walk in hospital room?: A Little Help needed climbing 3-5 steps with a railing? : A Little 6 Click Score: 18    End of Session Equipment Utilized During Treatment: Gait belt Activity Tolerance: Patient tolerated treatment well Patient left: in bed;with call bell/phone within reach;with nursing/sitter in room;with bed alarm set Nurse Communication: Mobility status PT Visit Diagnosis: Other abnormalities of gait and mobility (R26.89);Difficulty in walking, not elsewhere classified (R26.2)     Time: 9040-8974 PT Time Calculation (min) (ACUTE ONLY): 26 min  Charges:    $Gait Training: 8-22 mins $Therapeutic Activity: 8-22 mins PT General Charges $$ ACUTE PT VISIT: 1 Visit                    Darryle George, PTA Acute Rehabilitation Services Secure Chat Preferred  [phone]    Darryle George 02/03/2024, 11:17 AM

## 2024-02-04 ENCOUNTER — Encounter: Payer: Self-pay | Admitting: Family Medicine

## 2024-02-04 ENCOUNTER — Encounter: Payer: Self-pay | Admitting: Internal Medicine

## 2024-02-04 ENCOUNTER — Other Ambulatory Visit: Payer: Self-pay

## 2024-02-04 ENCOUNTER — Ambulatory Visit (HOSPITAL_COMMUNITY): Admitting: Psychiatry

## 2024-02-04 ENCOUNTER — Other Ambulatory Visit (HOSPITAL_COMMUNITY): Payer: Self-pay

## 2024-02-04 LAB — NA AND K (SODIUM & POTASSIUM), 24 H UR
Potassium Urine: 17 mmol/L
Potassium, 24H Ur: 68 mmol/(24.h) (ref 25–125)
Sodium, 24H Ur: 360 mmol/(24.h) — ABNORMAL HIGH (ref 40–220)
Sodium, Ur: 90 mmol/L
Urine Total Volume-UNAK24: 4000 mL

## 2024-02-04 LAB — GLUCOSE, CAPILLARY
Glucose-Capillary: 84 mg/dL (ref 70–99)
Glucose-Capillary: 161 mg/dL — ABNORMAL HIGH (ref 70–99)

## 2024-02-04 MED ORDER — METOPROLOL SUCCINATE ER 25 MG PO TB24: 12.5000 mg | ORAL_TABLET | Freq: Every day | ORAL | 3 refills | Status: AC

## 2024-02-04 MED ORDER — LEVOTHYROXINE SODIUM 75 MCG PO TABS: 75.0000 ug | ORAL_TABLET | Freq: Every day | ORAL | 1 refills | Status: AC

## 2024-02-04 MED ORDER — VALPROIC ACID 250 MG PO CAPS
500.0000 mg | ORAL_CAPSULE | Freq: Two times a day (BID) | ORAL | 0 refills | Status: DC
  Filled 2024-02-04: qty 120, 30d supply, fill #0

## 2024-02-04 NOTE — TOC Transition Note (Addendum)
 Transition of Care [facility]) - Discharge Note   Patient Details  Name: Raymond Christensen MRN: [id number] Date of Birth: 05/04/1940  Transition of [facility]) CM/SW Contact:  Raymond DELENA Senters, RN Phone Number: 02/04/2024, 11:37 AM   Clinical Narrative:    RR:[id number] history significant of CAD status post CABG, carotid artery disease status post left CEA in 2016, CHF, mitral regurgitation status post TEER with MitraClip in 2021, A-fib, hypertension hyperlipidemia, hypothyroidism, type 2 diabetes, depression, anxiety, OSA on CPAP, GERD, BPH, overactive bladder.  He was admitted to the hospital last month 12/12-12/29 for traumatic left temporal intracranial hemorrhage, subarachnoid hemorrhage, subdural hemorrhage, expressive aphasia, and seizures.  He was started on Keppra  and Eliquis  was discontinued.  His home Wellbutrin  was discontinued due to seizure.  Also had melena with positive FOBT for which GI had recommended monitoring.  He was started on midodrine  for orthostatic hypotension.   Patient lives with wife at [facility]. Patient reports he drives, has PCP, manages own medications, DME reviewed-rollator, shower bench.   Therapy rec for HHPT/OT. Patient has been active with [facility] [place] and wants to continue with this. CM called [facility] and left HIPAA compliant VM. Orders faxed to [facility] for PT/OT/ST  Dr. Ileen for valproic  acid level to be drawn on Friday 1/23 with [facility] medical. CM did confirm [facility] is able to complete lab draw. Orders faxed.   No further needs identified by CM.    Final next level of care: Home w Home Health Services Barriers to Discharge: No Barriers Identified   Patient Goals and CMS Choice   CMS Medicare.gov Compare Post Acute Care list provided to:: Patient Choice offered to / list presented to : Patient      Discharge Placement                       Discharge Plan and Services Additional resources added to the After Visit Summary for                                        Social Drivers of Health (SDOH) Interventions SDOH Screenings   Food Insecurity: No Food Insecurity (02/04/2024)  Housing: Low Risk (02/04/2024)  Transportation Needs: No Transportation Needs (02/04/2024)  Utilities: Not At Risk (02/04/2024)  Alcohol Screen: Low Risk (07/31/2022)  Depression (PHQ2-9): Low Risk (01/20/2024)  Financial Resource Strain: Low Risk (07/31/2022)  Physical Activity: Inactive (07/31/2022)  Social Connections: Socially Integrated (02/04/2024)  Stress: No Stress Concern Present (07/31/2022)  Tobacco Use: Medium Risk (02/01/2024)  Health Literacy: Adequate Health Literacy (07/31/2022)     Readmission Risk Interventions     No data to display

## 2024-02-04 NOTE — Discharge Summary (Signed)
 Physician Discharge Summary  Zurich Carreno FMW:[id number] DOB: 02/12/1940 DOA: 02/01/2024  PCP: Frann Mabel Mt, DO  Admit date: 02/01/2024 Discharge date: 02/04/2024  Admitted From:(Home) Disposition:  (Home )  Recommendations for Outpatient Follow-up:  Follow up with PCP in 3 to 5 days Please obtain BMP/CBC, as well please obtain valproic  acid level and Friday morning and sent to primary neurologist Dr. Camara to adjust dose if needed.    Diet recommendation: Heart Healthy  Brief/Interim Summary:  84 year old with past medical history significant for CAD status post CABG, carotid artery disease status post left CEA 2016, CHF, mitral regurgitation status post TEE or with MitraClip 2021, A-fib, hypertension, hyperlipidemia, hypothyroidism, diabetes type 2, depression, anxiety, OSA on CPAP, GERD, BPH overreactive bladder who was admitted to the hospital last month from 12/12 until 12/29 for traumatic left temporal intracranial hemorrhage, subarachnoid hemorrhage, subdural hemorrhage, expressive aphasia and seizure.  He was started on Keppra  and Eliquis  was discontinued.  Home Wellbutrin  was discontinued due to seizure.  Felt to have some melena with positive FOBT GI recommended monitoring.  Started on midodrine  for orthostatic hypotension.   Patient presented this admission via EMS for sudden onset of slurred speech and right facial droop.  Blood pressure was elevated 198/82.  CT head showed prior ICH/SAH/S [facility] which has improved/stable no acute finding.  Neurology suspecting recrudescence of prior ICH SAH versus possible focal seizure.  Please see discussion below.  Acute onset of slurred speech and mild right facial droop - Management per neurology, Question recrudescence of old stroke symptoms versus new stroke versus seizure., Initial plan was not to obtain any further imaging but he reports that he still does not feel back to his baseline although on exam he only has a mild right  facial droop, further imaging was obtained, MRI brain reveals no acute infarct, but there was some concern for cerebral venous sinus thrombosis, but CT venogram was negative. EEG was negative for seizures or epileptiform discharges.   - Electrolyte derangement felt to be contributing including hyponatremia, please see discussion below . - As well patient medication has been adjusted in outpatient setting where his Keppra  has been tapered off, and he was started on Trileptal  which as well thought to keep causing hyponatremia, so patient was started on valproic  acid 500 mg twice daily, with recommendation to check valproic  level in 3 days with follow-up with PCP in 3 to 5 days and follow-up with his neurologist Dr. Gregg for neuropsychological evaluation as an outpatient .   Hyponatremia - Dehydration, and possibly related to Trileptal , Trileptal  discontinued, appropriately hydrated, much improved.   Urinary frequency: UA negative for UTI, resolved frequency monitor.  Could be due to him getting IV fluids.   Prior ICH/SAH/SDH - History of seizure  -neurology following, EEG negative, on valproic  acid/Depakene  which will be continued.   Paroxysmal A-fib - CAD - Continue with home dose beta-blockers, not on anticoagulation due to recent brain bleed  Hypertension.   -Blood pressure was elevated on presentation so midodrine  was discontinued, resume dose Toprol -XL on discharge .  He had mild symptomatic bradycardia but this is most likely was in the setting of midodrine    Hyperlipidemia  Continue with Zetia  and Lipitor    Hypothyroidism  Continue Synthroid    Chronic diastolic heart failure  Mitral regurgitation status post TER with MitraClip 2021, currently dehydrated   Depression/anxiety   Continue sertraline  paroxetine  and lorazepam    OSA continue CPAP at bedtime   GERD continue Pepcid    BPH continue finasteride   History of Orthostatic Hypotension.  Last admission.  Discharge on low  dose midodrine  2.5 BID.  Midodrine  was discontinue as out patient be\cause of elevated BP Continue to hold midodrine ,.        Discharge Diagnoses:  Principal Problem:   Slurred speech Active Problems:   History of seizures   Hyperlipidemia   Hyponatremia   Urinary frequency    Discharge Instructions  Discharge Instructions     Increase activity slowly   Complete by: As directed       Allergies as of 02/04/2024       Reactions   Sulfa Antibiotics Rash   Questionable, he developed a diffuse rash 2 days after stopping Bactrim        Medication List     STOP taking these medications    levETIRAcetam  500 MG tablet Commonly known as: Keppra    midodrine  2.5 MG tablet Commonly known as: PROAMATINE    Oxcarbazepine  300 MG tablet Commonly known as: TRILEPTAL    Ozempic  (2 MG/DOSE) 8 MG/3ML Sopn Generic drug: Semaglutide  (2 MG/DOSE)       TAKE these medications    acetaminophen  500 MG tablet Commonly known as: TYLENOL  Take 1,000 mg by mouth daily.   atorvastatin  80 MG tablet Commonly known as: LIPITOR  TAKE 1 TABLET BY MOUTH DAILY   docusate sodium  100 MG capsule Commonly known as: COLACE Take 100-200 mg by mouth at bedtime.   ezetimibe  10 MG tablet Commonly known as: ZETIA  TAKE 1 TABLET BY MOUTH DAILY   famotidine  20 MG tablet Commonly known as: PEPCID  Take 20 mg by mouth daily.   finasteride  5 MG tablet Commonly known as: PROSCAR  Take 1 tablet (5 mg total) by mouth daily.   levothyroxine  75 MCG tablet Commonly known as: SYNTHROID  TAKE 1 TABLET BY MOUTH DAILY   liothyronine  5 MCG tablet Commonly known as: CYTOMEL  TAKE 2 TABLETS BY MOUTH DAILY   LORazepam  0.5 MG tablet Commonly known as: Ativan  1 bid What changed:  how much to take how to take this when to take this additional instructions   metoprolol  succinate 25 MG 24 hr tablet Commonly known as: TOPROL -XL Take 0.5 tablets (12.5 mg total) by mouth daily.   MULTIVITAMIN ADULT  EXTRA C PO Take 1 tablet by mouth daily.   nitroGLYCERIN  0.4 MG SL tablet Commonly known as: NITROSTAT  Place 1 tablet (0.4 mg total) under the tongue every 5 (five) minutes as needed for chest pain.   PARoxetine  10 MG tablet Commonly known as: PAXIL  TAKE 1 TABLET BY MOUTH DAILY   polyethylene glycol powder 17 GM/SCOOP powder Commonly known as: GLYCOLAX /MIRALAX  Dissolve 17 g as directed and take by mouth daily. Titrate as needed for a soft bowel movement daily   sertraline  50 MG tablet Commonly known as: Zoloft  Take 1 tablet (50 mg total) by mouth daily.   UNABLE TO FIND Place into the left eye in the morning, at noon, in the evening, and at bedtime. Prednisolone  Phosphate 1% /  Moxifloxacin  0.5% / Bromfenac  0.075% Sterile Ophthalmic Solution   valproic  acid 250 MG capsule Commonly known as: DEPAKENE  Take 2 capsules (500 mg total) by mouth 2 (two) times daily.   Vitamin D3 250 MCG (10000 UT) Tabs Take 1 tablet by mouth daily.        Allergies[1]  Consultations: neurology   Procedures/Studies: EEG adult Result Date: 02/02/2024 Shelton Arlin KIDD, MD     02/02/2024  4:19 PM Patient Name: Praneeth Bussey MRN: [id number] Epilepsy Attending: Arlin KIDD Shelton Referring Physician/Provider:  Alfornia Madison, MD Date: 02/02/2024 Duration: 22.59 mins Patient history: 84 y.o. male with slurred speech and a mild right facial droop. EEG to evaluate for seizure  Level of alertness: Awake, asleep AEDs during EEG study: LEV, OXC, Ativan  Technical aspects: This EEG study was done with scalp electrodes positioned according to the 10-20 International system of electrode placement. Electrical activity was reviewed with band pass filter of 1-70Hz , sensitivity of 7 uV/mm, display speed of 42mm/sec with a 60Hz  notched filter applied as appropriate. EEG data were recorded continuously and digitally stored.  Video monitoring was available and reviewed as appropriate. Description: The posterior dominant  rhythm consists of 7.5 Hz activity of moderate voltage (25-35 uV) seen predominantly in posterior head regions, symmetric and reactive to eye opening and eye closing. Sleep was characterized by vertex waves, sleep spindles (12 to 14 Hz), maximal frontocentral region. There is intermittent generalized 3 to 6 Hz theta-delta slowing. Hyperventilation and photic stimulation were not performed.   ABNORMALITY - Intermittent slow, generalized IMPRESSION: This study is suggestive of mild generalized cerebral dysfunction ( encephalopathy). No seizures or epileptiform discharges were seen throughout the recording. Arlin MALVA Krebs   CT VENOGRAM HEAD Result Date: 02/02/2024 EXAM: CT VENOGRAM WITH CONTAST 02/02/2024 12:55:28 PM TECHNIQUE: CT venogram of the head/brain was performed with the administration of intravenous contrast. 75 mL of iohexol  (OMNIPAQUE ) 350 MG/ML injection was administered. Multiplanar reformatted images are provided for review. MIP images are provided for review. Automated exposure control, iterative reconstruction, and/or weight based adjustment of the mA/kV was utilized to reduce the radiation dose to as low as reasonably achievable. COMPARISON: Head CT 02/01/2024 and MRI 02/02/2024. CLINICAL HISTORY: Dural venous sinus thrombosis suspected. FINDINGS: BRAIN/VENTRICLES: A small subdural hematoma over the left cerebral convexity measuring up to 3 mm in thickness and small volume residual subarachnoid hemorrhage most conspicuously in the left sylvian fissure and left parietooccipital sulci are unchanged. There is no midline shift or other significant mass effect. No new intracranial hemorrhage, acute infarct, mass, or hydrocephalus is evident. There is mild cerebral atrophy. Cerebral white matter hypodensities are nonspecific but compatible with mild chronic small vessel ischemic disease. Calcified atherosclerosis at the skull base. ORBITS: Left cataract extraction. SINUSES AND MASTOIDS: No acute  abnormality. SOFT TISSUES AND SKULL: No acute abnormality. CT VENOGRAM: The dural venous sinuses are patent without evidence of thrombus or significant stenosis. Abnormal appearance of the left transverse and sigmoid sinuses on MRI presumably reflected slow flow. IMPRESSION: 1. No dural venous sinus thrombosis. 2. Unchanged small subdural hematoma over the left cerebral convexity and small volume residual subarachnoid hemorrhage. Electronically signed by: Dasie Hamburg MD 02/02/2024 01:12 PM EST RP Workstation: [id number]   MR BRAIN WO CONTRAST Result Date: 02/02/2024 EXAM: MRI BRAIN WITHOUT CONTRAST 02/02/2024 09:35:54 AM TECHNIQUE: Multiplanar multisequence MRI of the head/brain was performed without the administration of intravenous contrast. COMPARISON: MRI of the head dated 01/02/2024. CLINICAL HISTORY: Stroke, follow up. FINDINGS: BRAIN AND VENTRICLES: No acute infarct. Significant interval improvement and evolution of subarachnoid hemorrhage overlying the left cerebral hemisphere. Moderate hemosiderin staining throughout the cerebral sulci bilaterally, significantly worse on the left. A thin subdural hematoma overlying the left parietal lobe has also marginally improved. No mass. No midline shift. No hydrocephalus. Loss of the flow void present within the left transverse and sigmoid sinuses, concerning for sinus thrombosis. The sella is unremarkable. ORBITS: No significant abnormality. SINUSES AND MASTOIDS: Mild mucosal disease within the floors of the maxillary sinuses. BONES AND SOFT TISSUES: Normal marrow signal.  No soft tissue abnormality. IMPRESSION: 1. Loss of the flow void within the left transverse and sigmoid sinuses, suspicious for dural venous sinus thrombosis (differential includes slow flow/flow-related artifact); recommend confirmation with MRV or CTV and correlation with clinical/laboratory risk factors. 2. Significant interval improvement and evolution of subarachnoid hemorrhage overlying the  left cerebral hemisphere; in this age group and distribution, differential considerations include traumatic subarachnoid hemorrhage, cerebral amyloid angiopathy, and hemorrhage related to cerebral venous thrombosis; follow-up imaging as clinically indicated for recurrent/worsening symptoms. 3. Moderate hemosiderin staining throughout the cerebral sulci bilaterally, significantly worse on the left, compatible with sequelae of prior subarachnoid hemorrhage/superficial siderosis. 4. Marginally improved thin subdural hematoma overlying the left parietal lobe, most commonly posttraumatic; follow-up head CT or MRI if clinical status changes or symptoms persist. 5. No restricted diffusion to indicate acute cerebral infarction. Electronically signed by: Evalene Coho MD 02/02/2024 10:04 AM EST RP Workstation: [id number]   CT HEAD CODE STROKE WO CONTRAST Result Date: 02/01/2024 EXAM: CT HEAD WITHOUT CONTRAST 02/01/2024 05:39:17 PM TECHNIQUE: CT of the head was performed without the administration of intravenous contrast. Automated exposure control, iterative reconstruction, and/or weight based adjustment of the mA/kV was utilized to reduce the radiation dose to as low as reasonably achievable. COMPARISON: CT head without contrast and MR head without contrast 01/02/2024. CLINICAL HISTORY: Neuro deficit, acute, stroke suspected. FINDINGS: BRAIN AND VENTRICLES: Trace subarachnoid hemorrhage within left temporal, parietal and occipital lobes. Decreased compared to prior study. Resolving left temporal lobe parenchymal hemorrhage. Stable 2 mm left parietal subdural hematoma. Generalized cerebral atrophy is stable. Patchy white matter low-density suggestive of chronic small vessel ischemic disease, stable. No evidence of acute infarct. No hydrocephalus. No mass effect or midline shift. ORBITS: No acute abnormality. SINUSES: No acute abnormality. SOFT TISSUES AND SKULL: No acute soft tissue abnormality. No skull fracture.  IMPRESSION: 1. Trace subarachnoid hemorrhage within left temporal, parietal, and occipital lobes, decreased compared to prior study. 2. Resolving left temporal lobe parenchymal hemorrhage. 3. Stable 2 mm left parietal subdural hematoma. 4. No new or enlarging hemorrhage. No acute infarct. Electronically signed by: Lonni Necessary MD 02/01/2024 05:44 PM EST RP Workstation: [id number]   (Echo, Carotid, EGD, Colonoscopy, ERCP)    Subjective: Denies any complaints today, reports good appetite and oral intake  Discharge Exam: Vitals:   02/04/24 0840 02/04/24 1202  BP: 138/64 135/62  Pulse:    Resp:    Temp: 98.4 F (36.9 C) 98.3 F (36.8 C)  SpO2:     Vitals:   02/04/24 0018 02/04/24 0418 02/04/24 0840 02/04/24 1202  BP: (!) 141/68 (!) 141/68 138/64 135/62  Pulse:      Resp: 18 16    Temp: 97.9 F (36.6 C) 98.2 F (36.8 C) 98.4 F (36.9 C) 98.3 F (36.8 C)  TempSrc: Oral Oral Oral Oral  SpO2:  100%    Weight:      Height:        General: Pt is alert, awake, not in acute distress Cardiovascular: RRR, S1/S2 +, no rubs, no gallops Respiratory: CTA bilaterally, no wheezing, no rhonchi Abdominal: Soft, NT, ND, bowel sounds + Extremities: no edema, no cyanosis    The results of significant diagnostics from this hospitalization (including imaging, microbiology, ancillary and laboratory) are listed below for reference.     Microbiology: No results found for this or any previous visit (from the past 240 hours).   Labs: BNP (last 3 results) No results for input(s): BNP in the last 8760 hours. Basic Metabolic Panel:  Recent Labs  Lab 02/01/24 1743 02/01/24 1800 02/02/24 0202 02/02/24 0959 02/02/24 1354 02/02/24 2011 02/03/24 0240  NA 123* 124* 126* 127* 127* 128* 129*  K 5.5* 4.3  --   --   --  4.4 5.0  CL 92* 91*  --   --   --  93* 95*  CO2  --  22  --   --   --  28 27  GLUCOSE 99 100*  --   --   --  106* 81  BUN 11 9  --   --   --  8 9  CREATININE 0.70 0.67   --   --   --  0.72 0.81  CALCIUM   --  8.8*  --   --   --  8.4* 8.8*   Liver Function Tests: Recent Labs  Lab 02/01/24 1800  AST 30  ALT 33  ALKPHOS 90  BILITOT 0.4  PROT 6.4*  ALBUMIN  3.8   No results for input(s): LIPASE, AMYLASE in the last 168 hours. No results for input(s): AMMONIA in the last 168 hours. CBC: Recent Labs  Lab 02/01/24 1743 02/01/24 1800 02/03/24 0240  WBC  --  8.0 8.2  NEUTROABS  --  5.0  --   HGB 14.6 12.9* 12.6*  HCT 43.0 39.0 37.1*  MCV  --  85.5 84.1  PLT  --  177 176   Cardiac Enzymes: No results for input(s): CKTOTAL, CKMB, CKMBINDEX, TROPONINI in the last 168 hours. BNP: Invalid input(s): POCBNP CBG: Recent Labs  Lab 02/03/24 1154 02/03/24 1558 02/03/24 2158 02/04/24 0843 02/04/24 1159  GLUCAP 104* 132* 120* 84 161*   D-Dimer No results for input(s): DDIMER in the last 72 hours. Hgb A1c No results for input(s): HGBA1C in the last 72 hours. Lipid Profile No results for input(s): CHOL, HDL, LDLCALC, TRIG, CHOLHDL, LDLDIRECT in the last 72 hours. Thyroid  function studies No results for input(s): TSH, T4TOTAL, T3FREE, THYROIDAB in the last 72 hours.  Invalid input(s): FREET3 Anemia work up No results for input(s): VITAMINB12, FOLATE, FERRITIN, TIBC, IRON, RETICCTPCT in the last 72 hours. Urinalysis    Component Value Date/Time   COLORURINE STRAW (A) 02/02/2024 1722   APPEARANCEUR CLEAR 02/02/2024 1722   APPEARANCEUR Clear 10/16/2022 0000   LABSPEC 1.015 02/02/2024 1722   PHURINE 8.0 02/02/2024 1722   GLUCOSEU NEGATIVE 02/02/2024 1722   HGBUR NEGATIVE 02/02/2024 1722   BILIRUBINUR NEGATIVE 02/02/2024 1722   BILIRUBINUR Negative 10/16/2022 0000   KETONESUR NEGATIVE 02/02/2024 1722   PROTEINUR NEGATIVE 02/02/2024 1722   NITRITE NEGATIVE 02/02/2024 1722   LEUKOCYTESUR NEGATIVE 02/02/2024 1722   Sepsis Labs Recent Labs  Lab 02/01/24 1800 02/03/24 0240  WBC 8.0 8.2    Microbiology No results found for this or any previous visit (from the past 240 hours).   Time coordinating discharge: Over 30 minutes  SIGNED:   Brayton Lye, MD  [facility] 02/04/2024, 12:08 PM Pager   If 7PM-7AM, please contact night-coverage [email or web address]     [1]  Allergies Allergen Reactions   Sulfa Antibiotics Rash    Questionable, he developed a diffuse rash 2 days after stopping Bactrim

## 2024-02-05 ENCOUNTER — Telehealth: Payer: Self-pay

## 2024-02-05 NOTE — Transitions of Care (Post Inpatient/ED Visit) (Signed)
 "  02/05/2024  Name: Raymond Christensen MRN: [id number] DOB: 1940-08-24  Today's TOC FU Call Status: Today's TOC FU Call Status:: Successful TOC FU Call Completed TOC FU Call Complete Date: 02/05/24  Patient's Name and Date of Birth confirmed. Name, DOB  Transition Care Management Follow-up Telephone Call Date of Discharge: 02/04/24 Discharge Facility: [facility]) Type of Discharge: Inpatient Admission Primary Inpatient Discharge Diagnosis:: aphasia How have you been since you were released from the hospital?: Better Any questions or concerns?: No  Items Reviewed: Did you receive and understand the discharge instructions provided?: Yes Medications obtained,verified, and reconciled?: Yes (Medications Reviewed) Any new allergies since your discharge?: No Dietary orders reviewed?: Yes Do you have support at home?: Yes People in Home [RPT]: spouse  Medications Reviewed Today: Medications Reviewed Today     Reviewed by Emmitt Pan, LPN (Licensed Practical Nurse) on 02/05/24 at [phone]  Med List Status: <None>   Medication Order Taking? Sig Documenting Provider Last Dose Status Informant  acetaminophen  (TYLENOL ) 500 MG tablet [id number] Yes Take 1,000 mg by mouth daily. [provider]  Active Pharmacy Records, Spouse/Significant Other, Other  atorvastatin  (LIPITOR ) 80 MG tablet [id number] Yes TAKE 1 TABLET BY MOUTH DAILY Chandrasekhar, Mahesh A, MD  Active Pharmacy Records, Spouse/Significant Other, Other  Cholecalciferol  (VITAMIN D3) 250 MCG (10000 UT) TABS [id number] Yes Take 1 tablet by mouth daily. [provider]  Active Pharmacy Records, Spouse/Significant Other, Other  docusate sodium  (COLACE) 100 MG capsule [id number] Yes Take 100-200 mg by mouth at bedtime. [provider]  Active Pharmacy Records, Spouse/Significant Other, Other  ezetimibe  (ZETIA ) 10 MG tablet [id number] Yes TAKE 1 TABLET BY MOUTH DAILY Chandrasekhar, Stanly LABOR, MD  Active Pharmacy  Records, Spouse/Significant Other, Other  famotidine  (PEPCID ) 20 MG tablet [id number] Yes Take 20 mg by mouth daily. [provider]  Active Pharmacy Records, Spouse/Significant Other, Other  finasteride  (PROSCAR ) 5 MG tablet [id number] Yes Take 1 tablet (5 mg total) by mouth daily. Shona Layman BROCKS, MD  Active Pharmacy Records, Spouse/Significant Other, Other  levothyroxine  (SYNTHROID ) 75 MCG tablet [id number] Yes Take 1 tablet (75 mcg total) by mouth daily. Frann Mabel Mt, DO  Active   liothyronine  (CYTOMEL ) 5 MCG tablet [id number] Yes TAKE 2 TABLETS BY MOUTH DAILY Frann Mabel Mt, DO  Active Pharmacy Records, Spouse/Significant Other, Other  LORazepam  (ATIVAN ) 0.5 MG tablet [id number] Yes 1 bid  Patient taking differently: Take 0.5 mg by mouth 2 (two) times daily.   Tasia Lung, MD  Active Pharmacy Records, Spouse/Significant Other, Other  metoprolol  succinate (TOPROL -XL) 25 MG 24 hr tablet [id number] Yes Take 0.5 tablets (12.5 mg total) by mouth daily. Frann Mabel Mt, DO  Active   Multiple Vitamins-Minerals (MULTIVITAMIN ADULT EXTRA C PO) [id number] Yes Take 1 tablet by mouth daily.  [provider]  Active Pharmacy Records, Spouse/Significant Other, Other  nitroGLYCERIN  (NITROSTAT ) 0.4 MG SL tablet [id number] Yes Place 1 tablet (0.4 mg total) under the tongue every 5 (five) minutes as needed for chest pain. Frann Mabel Mt, DO  Active Pharmacy Records, Spouse/Significant Other, Other           Med Note (CRUTHIS, CHLOE C   Fri Dec 26, 2023  2:15 PM)    PARoxetine  (PAXIL ) 10 MG tablet [id number] Yes TAKE 1 TABLET BY MOUTH DAILY Frann Mabel Mt, DO  Active Pharmacy Records, Spouse/Significant Other, Other  polyethylene glycol powder (GLYCOLAX /MIRALAX ) 17 GM/SCOOP powder [id number] Yes Dissolve 17 g as directed and take by mouth daily. Titrate as needed for  a soft bowel movement daily Perri DELENA Meliton Mickey., MD  Active Spouse/Significant Other,  Other, Pharmacy Records  sertraline  (ZOLOFT ) 50 MG tablet [id number] Yes Take 1 tablet (50 mg total) by mouth daily. Tasia Lung, MD  Active Spouse/Significant Other, Other, Pharmacy Records  UNABLE TO FIND [id number]  Place into the left eye in the morning, at noon, in the evening, and at bedtime. Prednisolone  Phosphate 1% /  Moxifloxacin  0.5% / Bromfenac  0.075% Sterile Ophthalmic Solution  Patient not taking: Reported on 02/05/2024   [provider]  Active Pharmacy Records, Spouse/Significant Other, Other  valproic  acid (DEPAKENE ) 250 MG capsule [id number] Yes Take 2 capsules (500 mg total) by mouth 2 (two) times daily. Elgergawy, Brayton RAMAN, MD  Active             Home Care and Equipment/Supplies: Were Home Health Services Ordered?: NA Any new equipment or medical supplies ordered?: NA  Functional Questionnaire: Do you need assistance with bathing/showering or dressing?: Yes Do you need assistance with meal preparation?: Yes Do you need assistance with eating?: No Do you have difficulty maintaining continence: No Do you need assistance with getting out of bed/getting out of a chair/moving?: No Do you have difficulty managing or taking your medications?: Yes  Follow up appointments reviewed: PCP Follow-up appointment confirmed?: Yes Date of PCP follow-up appointment?: 02/13/24 Follow-up Provider: [facility] Follow-up appointment confirmed?: Yes Date of Specialist follow-up appointment?: 03/16/24 Follow-Up Specialty Provider:: neuro Do you need transportation to your follow-up appointment?: No Do you understand care options if your condition(s) worsen?: Yes-patient verbalized understanding    SIGNATURE Julian Lemmings, LPN [facility] Nurse Health Advisor Direct Dial [phone]  "

## 2024-02-06 ENCOUNTER — Telehealth: Payer: Self-pay

## 2024-02-06 ENCOUNTER — Ambulatory Visit: Admitting: Clinical

## 2024-02-06 ENCOUNTER — Ambulatory Visit: Payer: Self-pay | Admitting: Family Medicine

## 2024-02-06 ENCOUNTER — Ambulatory Visit: Admitting: Family Medicine

## 2024-02-06 ENCOUNTER — Encounter: Payer: Self-pay | Admitting: Neurology

## 2024-02-06 ENCOUNTER — Encounter: Payer: Self-pay | Admitting: Family Medicine

## 2024-02-06 ENCOUNTER — Ambulatory Visit: Payer: Self-pay

## 2024-02-06 VITALS — BP 128/64 | HR 73 | Temp 98.0°F | Resp 16 | Ht 69.0 in | Wt 198.6 lb

## 2024-02-06 DIAGNOSIS — N401 Enlarged prostate with lower urinary tract symptoms: Secondary | ICD-10-CM | POA: Diagnosis not present

## 2024-02-06 DIAGNOSIS — E871 Hypo-osmolality and hyponatremia: Secondary | ICD-10-CM | POA: Diagnosis not present

## 2024-02-06 DIAGNOSIS — G40909 Epilepsy, unspecified, not intractable, without status epilepticus: Secondary | ICD-10-CM

## 2024-02-06 DIAGNOSIS — R351 Nocturia: Secondary | ICD-10-CM | POA: Diagnosis not present

## 2024-02-06 DIAGNOSIS — I10 Essential (primary) hypertension: Secondary | ICD-10-CM

## 2024-02-06 LAB — BASIC METABOLIC PANEL WITH GFR
BUN: 15 mg/dL (ref 6–23)
CO2: 31 meq/L (ref 19–32)
Calcium: 9.2 mg/dL (ref 8.4–10.5)
Chloride: 99 meq/L (ref 96–112)
Creatinine, Ser: 0.76 mg/dL (ref 0.40–1.50)
GFR: 83.8 mL/min
Glucose, Bld: 86 mg/dL (ref 70–99)
Potassium: 4.3 meq/L (ref 3.5–5.1)
Sodium: 135 meq/L (ref 135–145)

## 2024-02-06 LAB — CBC
HCT: 39 % (ref 39.0–52.0)
Hemoglobin: 12.9 g/dL — ABNORMAL LOW (ref 13.0–17.0)
MCHC: 33.1 g/dL (ref 30.0–36.0)
MCV: 85.6 fl (ref 78.0–100.0)
Platelets: 181 K/uL (ref 150.0–400.0)
RBC: 4.55 Mil/uL (ref 4.22–5.81)
RDW: 14.5 % (ref 11.5–15.5)
WBC: 9.8 K/uL (ref 4.0–10.5)

## 2024-02-06 LAB — VALPROIC ACID LEVEL: Valproic Acid Lvl: 42.9 mg/L — ABNORMAL LOW (ref 50.0–100.0)

## 2024-02-06 NOTE — Telephone Encounter (Signed)
 Initial Comment Caller states her husband was in the hospital Sunday night due to low sodium. He is currently experiencing frequent urination. Also he has a history of stroke. Additional Comment Also caller states patient was tested for UTI at the hospital and it came back negative. Translation No Nurse Assessment Nurse: Vonzell, RN, Deanna Date/Time (Eastern Time): 02/06/2024 7:31:12 AM Confirm and document reason for call. If symptomatic, describe symptoms. ---Caller states pt had a brain bleed a month ago with slurred speech. Patient started having slurred speech and was admitted for r/o stroke, dx with low sodium, sodium replaced and pt discharged Wednesday 1/21. UA done to rule out UTI on Monday 1/19, 1/20 and 1/21 and all negative. No other symptoms. No fever. Does the patient have any new or worsening symptoms? ---Yes Will a triage be completed? ---Yes Related visit to physician within the last 2 weeks? ---Yes Does the PT have any chronic conditions? (i.e. diabetes, asthma, this includes High risk factors for pregnancy, etc.) ---Yes List chronic conditions. ---stroke, heart condition. Is this a behavioral health or substance abuse call? ---No Guidelines Guideline Title Affirmed Question Affirmed Notes Nurse Date/Time (Eastern Time) Urinary Symptoms Urinating more frequently than usual Vonzell, RN, Deanna 02/06/2024 7:34:14 AM PLEASE NOTE: All timestamps contained within this report are represented as Eastern Standard Time. CONFIDENTIALTY NOTICE: This fax transmission is intended only for the addressee. It contains information that is legally privileged, confidential or otherwise protected from use or disclosure. If you are not the intended recipient, you are strictly prohibited from reviewing, disclosing, copying using or disseminating any of this information or taking any action in reliance on or regarding this information. If you have received this fax in error,  please notify us  immediately by telephone so that we can arrange for its return to us . Phone: [phone], Toll-Free: [phone], Fax: [phone] [email or web address] [birth year] Page: 1 of2 CallId: [id number] Guidelines Guideline Title Affirmed Question Affirmed Notes Nurse Date/Time Titus Time) (i.e., frequency) OR new-onset of the feeling of an urgent need to urinate (i.e., urgency) Disp. Time Titus Time) Disposition Final User 02/06/2024 7:35:54 AM See PCP within 24 Hours Yes Vonzell, RN, Deanna Final Disposition 02/06/2024 7:35:54 AM See PCP within 24 Hours Yes Vonzell, RN, Deanna Caller Disagree/Comply Comply Caller Understands Yes PreDisposition Call Doctor Care Advice Given Per Guideline SEE PCP WITHIN 24 HOURS: CALL BACK IF: * Fever occurs * Unable to urinate and bladder feels full * You become worse * IF OFFICE WILL BE OPEN: You need to be examined within the next 24 hours. Call your doctor (or NP/PA) when the office opens and make an appointment. Referrals REFERRED TO PCP OFFICE

## 2024-02-06 NOTE — Telephone Encounter (Signed)
 Appt scheduled

## 2024-02-06 NOTE — Telephone Encounter (Signed)
 No triage performed Caller Received call from office staff and placed this RN on hold, then disconnected call. Called patients wife back.  She scheduled appointment independently with office staff for today, 02/06/2024  Message from Deaijah H sent at 02/06/2024  8:15 AM EST  Reason for Triage: Hospital follow up (discharged 9/21) - low sodium/dehydrated. Now Frequent urination at night but doesn't believes it an UTI

## 2024-02-06 NOTE — Patient Instructions (Addendum)
 Give us  2-3 business days to get the results of your labs back.   Try to drink 55-60 oz of water daily outside of exercise.  Try to limit fluid intake within 90 min of bedtime (4 oz or less).  Take Metamucil or Benefiber daily.  Try 2 tablespoons of milk of mag in 4 oz of warm prune juice. Do that and wait a couple hours. If no improvement, try a Dulcolax suppository and then let me know if we are still having issues.   Let us  know if you need anything.

## 2024-02-06 NOTE — Progress Notes (Signed)
 Chief Complaint  Patient presents with   Hospitalization Follow-up    Hospital Follow Up    Subjective: Patient is a 84 y.o. male here for hospitalization follow-up.  He is here with his wife and friend.  Patient was admitted on 1/18 and discharged on 02/04/2024.  He was having slurred speech and recently sustained a CVA/cerebral hemorrhage.  Scans were negative for repeat stroke.  Sodium was low.  Labs showed hypoosmolar hyponatremia.  He was placed on salt supplementation and discharged home.  Reports urinating 4-5 times per night.  He is compliant with finasteride  5 mg daily. He was on Flomax  in the past. He has a hx of falls. BM's are more erratic as he is dealing with constipation. No med changes.  Of note, he is on Zoloft  and Paxil  as rx'd by his psychiatrist.   Past Medical History:  Diagnosis Date   Arthritis    CAD (coronary artery disease)    Cancer (HCC)    skin lt. arm.   Carotid artery disease    s/p left CEA 01/17/14   Chronic systolic CHF (congestive heart failure) (HCC)    Concussion Summer 2012   Essential hypertension 02/26/2017   Heart disease    Hyperlipidemia    Hypothyroidism 10/15/2017   Major depressive disorder    Mitral valve regurgitation    Myocardial infarction [facility]) 1996, 2021   Obstructive sleep apnea on CPAP    Persistent atrial fibrillation (HCC)    Poor flexibility of tendon 12/01/2018   S/P mitral valve clip implantation 12/23/2019   s/p TEER with MitraClip with one XTW by Dr. Wonda   Subungual hematoma of digit of hand 12/01/2018   Subungual hematoma of right foot 12/01/2018   Type 2 diabetes mellitus without complication, without long-term current use of insulin  (HCC) 02/26/2017    Objective: BP 128/64 (BP Location: Left Arm, Cuff Size: Normal)   Pulse 73   Temp 98 F (36.7 C) (Oral)   Resp 16   Ht 5' 9 (1.753 m)   Wt 198 lb 9.6 oz (90.1 kg)   SpO2 97%   BMI 29.33 kg/m  General: Awake, appears stated age Heart: RRR, no LE  edema Lungs: CTAB, no rales, wheezes or rhonchi. No accessory muscle use Psych: Age appropriate judgment and insight, normal affect and mood  Assessment and Plan: Seizure disorder (HCC) - Plan: Valproic  Acid level  Essential hypertension  Hyponatremia - Plan: CBC, Basic metabolic panel with GFR  Benign prostatic hyperplasia with nocturia  Ck Valproic  acid level. Appreciate Neuro. Chronic, currently stable. Stay off of Midodrine . Monitor BP at home. Ck above. Cont Na supp. May need to wean off of SSRI's.  Chronic, not controlled. Will cont Finasteride  5 mg/d. Optimize fluid intake, decrease to 4 oz or less within 90 min of planned bedtime. Hold off on alpha-2 blockers given hx of falls and risk of orthostasis. Consider bowel management. Info provide on AVS.  The patient and his spouse voiced understanding and agreement to the plan.  I spent 40 min w the pt discussing the above plans in addition to reviewing his chart on the same day of the visit.   Mabel Mt [facility], DO 02/06/24  12:30 PM

## 2024-02-10 ENCOUNTER — Ambulatory Visit: Attending: Physician Assistant | Admitting: Physician Assistant

## 2024-02-10 VITALS — BP 148/78 | HR 61 | Ht 69.0 in | Wt 201.2 lb

## 2024-02-10 DIAGNOSIS — I5022 Chronic systolic (congestive) heart failure: Secondary | ICD-10-CM

## 2024-02-10 DIAGNOSIS — I10 Essential (primary) hypertension: Secondary | ICD-10-CM | POA: Diagnosis not present

## 2024-02-10 DIAGNOSIS — I059 Rheumatic mitral valve disease, unspecified: Secondary | ICD-10-CM

## 2024-02-10 DIAGNOSIS — I25110 Atherosclerotic heart disease of native coronary artery with unstable angina pectoris: Secondary | ICD-10-CM

## 2024-02-10 DIAGNOSIS — I480 Paroxysmal atrial fibrillation: Secondary | ICD-10-CM

## 2024-02-10 DIAGNOSIS — I349 Nonrheumatic mitral valve disorder, unspecified: Secondary | ICD-10-CM

## 2024-02-10 DIAGNOSIS — Z9889 Other specified postprocedural states: Secondary | ICD-10-CM

## 2024-02-10 DIAGNOSIS — I4819 Other persistent atrial fibrillation: Secondary | ICD-10-CM

## 2024-02-10 DIAGNOSIS — Z95818 Presence of other cardiac implants and grafts: Secondary | ICD-10-CM

## 2024-02-10 DIAGNOSIS — M7989 Other specified soft tissue disorders: Secondary | ICD-10-CM

## 2024-02-10 DIAGNOSIS — D6869 Other thrombophilia: Secondary | ICD-10-CM | POA: Diagnosis not present

## 2024-02-10 NOTE — Patient Instructions (Signed)
 Medication Instructions:  Your physician recommends that you continue on your current medications as directed. Please refer to the Current Medication list given to you today.  *If you need a refill on your cardiac medications before your next appointment, please call your pharmacy*  Lab Work: None ordered If you have labs (blood work) drawn today and your tests are completely normal, you will receive your results only by: MyChart Message (if you have MyChart) OR A paper copy in the mail If you have any lab test that is abnormal or we need to change your treatment, we will call you to review the results.  Follow-Up: At [facility], you and your health needs are our priority.  As part of our continuing mission to provide you with exceptional heart care, our providers are all part of one team.  This team includes your primary Cardiologist (physician) and Advanced Practice Providers or APPs (Physician Assistants and Nurse Practitioners) who all work together to provide you with the care you need, when you need it.  Your next appointment:   3 month(s)  Provider:   Stanly DELENA Leavens, MD    Other Instructions Elastic therapy [phone]

## 2024-02-10 NOTE — Progress Notes (Signed)
 " Cardiology Office Note:  .   Date:  02/10/2024  ID:  [facility] [facility], DOB [birth year], MRN [id number] PCP: Frann Mabel Mt, DO  [facility] Providers Cardiologist:  Stanly DELENA Leavens, MD Electrophysiologist:  Soyla Gladis Norton, MD {  History of Present Illness: .   [place] [facility] is a 84 y.o. male with a past medical history of atrial fibrillation, elevated troponin and new reduced EF 35 to 40% 08/2019, moderate to severe MR, LV thrombus on TEE, recent PCI and stent with SVG to circumflex here for follow-up appointment.  History includes MI back in 1994 status post cardiac arrest with lytics and PTCI, CABG 2001 (LIMA to LAD, SVG to OM 2, SVG to right PDA, SVG to D2) with postop A-fib, carotid stenosis status post L CEA and right subclavian stenosis, diet-controlled DM, hypertension, obesity, OSA on CPAP, mild cognitive impairment, and depression/mood disorder.  History of MitraClip 12/2019, history of AF ablation 01/2020.  Recent new onset bleeding on Eliquis /Plavix  with Plavix  DC 08/2020.  Patient was seen 05/31/2022 and was doing well at that time other than some fatigue.  He lives a rather sedentary life.  Wife notes that he naps more.  No interval hospital/ED visits at that appointment. On zoloft  50mg  daily.  No chest pain/pressure.  No DOE/SOB.  He was seen by me 07/04/2022, he had chest pain on 6/11 and was in the ER. Troponin was 1,478. Medical management was suggested. Should be on ASA per patient. His BP is very low today and I asked him to hold Entresto  for a few days.  It did come up to 100/60.  He did not eat or drink much this morning.  Asked to track his blood pressure an hour after morning medications.  Will be due for an echocardiogram in September.  He was seen by Jackee Alberts, NP and blood pressure was well-controlled at that time.  Compliant with medications.  He was anticipating resuming cardiac rehab and was cleared to do so.  Reported not staying hydrated only  drinking about 20 to 30 ounces of water daily.  Denied chest pain, palpitations, dyspnea, PND, orthopnea, nausea, vomiting, dizziness, syncope, edema, weight gain, early satiety.  Today, he presents to the office to review his echocardiogram.  Reviewed in great detail.  No medication changes recommended at this time.  We will need to keep a close eye on his mitral valve.  He is status post MitraClip but valve has a mild to moderate leak.  Tolerating medications.  Blood pressure is better controlled today.  Reviewed recent lab work and his lipid panel is at goal.  Reports no shortness of breath nor dyspnea on exertion. Reports no chest pain, pressure, or tightness. No edema, orthopnea, PND. Reports no palpitations.   Today, he presents with a hx of hypertension with concerns about blood pressure management and recent hospitalization.  He has a stroke and was recently hospitalized for slurred speech concerning for stroke. CT and MRI brain workup showed dehydration and hyponatremia without new stroke. He was rehydrated and sodium was corrected before discharge.  Two weeks ago his blood pressure rose to 190/99 mmHg. He had been on midodrine  for hypotension, which was stopped with subsequent BP improvement. He is now on metoprolol  succinate 12.5 mg and Lipitor  80 mg, and his blood pressure has been stable since these changes.  He has nocturia 4 to 5 times nightly, which is new compared with daytime. He takes finasteride .  He has paroxysmal atrial fibrillation, but no  recent episodes were documented during his hospitalization. Continuous monitoring showed no significant rate or rhythm abnormalities.  He has chronic diastolic heart failure with some pedal edema. He uses support hose and elevates his feet for relief.  He reports generalized weakness and is in physical and occupational therapy with improving strength. He notes a right hand tremor when pushing up from a chair, which is improving.  He has  had no recent stroke-like symptoms, including no slurred speech or arm weakness, and no new palpitations or other symptoms related to heart rate or rhythm.  Reports no shortness of breath nor dyspnea on exertion. Reports no chest pain, pressure, or tightness. No edema, orthopnea, PND. Reports no palpitations.   Discussed the use of AI scribe software for clinical note transcription with the patient, who gave verbal consent to proceed.    ROS: Refer to HPI for pertinent ROS  Studies Reviewed: .       Cardiac catheterization 06/26/2022  Left Main  Mid LM to Dist LM lesion is 70% stenosed.    Left Anterior Descending  Mid LAD lesion is 75% stenosed.    First Diagonal Branch  1st Diag lesion is 40% stenosed.    Second Diagonal Branch  2nd Diag lesion is 75% stenosed.    Left Circumflex    First Obtuse Marginal Branch  1st Mrg lesion is 75% stenosed.    Second Obtuse Marginal Branch  2nd Mrg-1 lesion is 80% stenosed.  2nd Mrg-2 lesion is 100% stenosed.    Right Coronary Artery  Prox RCA to Mid RCA lesion is 90% stenosed.  Mid RCA to Dist RCA lesion is 100% stenosed.    LIMA Graft To Dist LAD    Sequential Graft To 2nd Mrg, RPDA  Prox Graft to Mid Graft lesion before 2nd Mrg is 95% stenosed.  Dist Graft to Insertion lesion before 2nd Mrg is 85% stenosed. The lesion was previously treated .  Prox Graft to Mid Graft lesion between 2nd Mrg and RPDA is 100% stenosed. The lesion was previously treated .  Dist Graft to Insertion lesion between 2nd Mrg and RPDA is 100% stenosed.    Intervention   No interventions have been documented.   Left Heart  Left Ventricle There is moderate left ventricular systolic dysfunction. LV end diastolic pressure is mildly elevated. The left ventricular ejection fraction is 35-45% by visual estimate. There are LV function abnormalities. LVEDP 17 mmHg   Coronary Diagrams  Diagnostic Dominance: Right  Intervention      Physical Exam:    VS:  BP (!) 148/78   Pulse 61   Ht 5' 9 (1.753 m)   Wt 201 lb 3.2 oz (91.3 kg)   SpO2 99%   BMI 29.71 kg/m    Wt Readings from Last 3 Encounters:  02/10/24 201 lb 3.2 oz (91.3 kg)  02/06/24 198 lb 9.6 oz (90.1 kg)  02/01/24 175 lb 6.4 oz (79.6 kg)    GEN: Well nourished, well developed in no acute distress NECK: No JVD; No carotid bruits CARDIAC: IRIR, no murmurs, rubs, gallops RESPIRATORY:  Clear to auscultation without rales, wheezing or rhonchi  ABDOMEN: Soft, non-tender, non-distended EXTREMITIES:  No edema; No deformity   ASSESSMENT AND PLAN: .     Chronic diastolic heart failure Impaired relaxation phase with no significant symptoms. Swelling likely due to previous hip surgeries. - Continue wearing support hose. - Elevate feet to reduce swelling. - Avoid diuretics unless symptoms worsen.  Hypertension Previously elevated, now well-controlled with  metoprolol . Midodrine  discontinued. - Continue metoprolol  succinate 12.5 mg daily. - Monitor blood pressure regularly.  History of paroxysmal atrial fibrillation No recent episodes, normal sinus rhythm noted on discharge. - Continue monitoring heart rhythm.  Hyponatremia and dehydration Recent episode resolved with sodium levels normalized. - Continue oral rehydration with 32 ounces of rehydration liquid daily. - Monitor sodium levels regularly.  Nocturia Increased nocturnal urination possibly related to finasteride . - Take finasteride  in the morning. - Limit fluid intake after dinner.   Dispo: He can follow-up with myself or Dr. Santo in 3 months  Signed, Orren LOISE Fabry, PA-C   "

## 2024-02-11 ENCOUNTER — Other Ambulatory Visit: Payer: Self-pay

## 2024-02-11 ENCOUNTER — Telehealth: Payer: Self-pay | Admitting: Family Medicine

## 2024-02-11 DIAGNOSIS — G40909 Epilepsy, unspecified, not intractable, without status epilepticus: Secondary | ICD-10-CM

## 2024-02-11 NOTE — Telephone Encounter (Signed)
 I need an lab order for his lab appointment

## 2024-02-13 ENCOUNTER — Inpatient Hospital Stay: Admitting: Family Medicine

## 2024-02-13 ENCOUNTER — Ambulatory Visit

## 2024-02-13 ENCOUNTER — Other Ambulatory Visit (INDEPENDENT_AMBULATORY_CARE_PROVIDER_SITE_OTHER)

## 2024-02-13 DIAGNOSIS — I10 Essential (primary) hypertension: Secondary | ICD-10-CM

## 2024-02-13 DIAGNOSIS — G40909 Epilepsy, unspecified, not intractable, without status epilepticus: Secondary | ICD-10-CM

## 2024-02-13 NOTE — Progress Notes (Signed)
 Pt here for Blood pressure check per Wendling  Pt currently takes: Metoprolol  12.5mg  daily   Pt reports compliance with medication.  BP today @ = 132/78 HR = 61  Pt advised per Wendling no changes. Continue blood pressures at home.

## 2024-02-14 ENCOUNTER — Ambulatory Visit: Payer: Self-pay | Admitting: Family Medicine

## 2024-02-14 LAB — VALPROIC ACID LEVEL: Valproic Acid Lvl: 43.3 mg/L — ABNORMAL LOW (ref 50.0–100.0)

## 2024-02-16 ENCOUNTER — Other Ambulatory Visit: Payer: Self-pay | Admitting: Family Medicine

## 2024-02-16 MED ORDER — VALPROIC ACID 250 MG PO CAPS: 750.0000 mg | ORAL_CAPSULE | Freq: Two times a day (BID) | ORAL | 0 refills | Status: AC

## 2024-02-17 ENCOUNTER — Ambulatory Visit: Admitting: Clinical

## 2024-02-17 ENCOUNTER — Other Ambulatory Visit: Payer: Self-pay | Admitting: Family Medicine

## 2024-02-17 ENCOUNTER — Encounter (HOSPITAL_COMMUNITY): Payer: Self-pay

## 2024-02-17 DIAGNOSIS — F4321 Adjustment disorder with depressed mood: Secondary | ICD-10-CM | POA: Diagnosis not present

## 2024-02-17 NOTE — Progress Notes (Signed)
 Time: 10:00 am-10:53 am CPT Code: 09162E-04 Diagnosis: F43.21, Adjustment Disorder with Depression  [facility] [facility] was seen remotely using secure video conferencing. He was in his home and therapist was in her office at time of appointment. Client is aware of risks of telehealth and consented to a virtual visit. Session switched to audio only after 10 minutes due to connectivity issues. [place]'s wife [place] [facility] joined the session with his verbal and written authorization. He reported upon a recent hospitalizion due to a suspected stroke, which turned out to be dehydration. [place] had started depakote , and both he and his wife reported a significant improvement in mood as an immediate result. He has been getting out more and enjoying socializing more, and shared that he sees the potential for friendships with several other [place] residents. He reported feeling the most positive he has felt about [place] wince moving there. For homework, he will continue participating in activities. He is scheduled to be seen again in two weeks.   Treatment Plan Strengths Client is intelligent, persistent  Preferences No preferences reported  Problems Client is experiencing symptoms of depression, and reports he does not perceive it as depression. He would like to become more dynamic and take control of the depression.  Needs Client would like to be happier within his current circumstances and be able to deal with them more effectively Goals [place] would like to return to his wonderful, dynamic, adorable, self. He reported that student used to call him Dr. Joen. He would like to be this way again.  Objective [place] would like to reduce his fear of becoming like the people he sees using walkers at his retirement community.   Target Date: 07/31/2024 Modality: Individual Therapy Progress: 0 Frequency: Weekly/biweekly  Diagnosis Adjustment disorder with depression Conditions For Discharge Achievement  of treatment goals and objectives  Intake Presenting Problem [place] [facility] presented at the urging of his son and daughter-in-law seeking an individual therapist. He had recently moved into [place]  (one month prior to initial appointment). He reported being upset by the number of people with physical and cognitive challenges. He noted that his wife started using a walker following her 80th birthday. He had previously thought this was temporary, but it has proven not to be. He shared that he has experienced symptoms of depression sine that time.  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 He has worked in conjunction with psychologists in conservation officer, historic buildings sexuality and other subjects. He has been in therapy in the past, but was disappointed in the experience. He shared that getting out and about and keeping busy has helped him in the past. 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 He shared that retirement has gone relatively well. 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 He been less involved with it in the past three months. In January of 2025 his wife began experiencing physical challenges and requiring a walker. Dementia was suspected at the time, but her cognitive ability has improved since then. Recent neurology tests have shown no improvement. He and his wife have been married for 24. He reported that he and his wife had not planned to move to [place], but did so at the urging of  their children. Prior to the move, they were satisfied with their home and routine. He reported that his wife was hesitant at first but is acclimating. They had planned on traveling, and have a trip planned  to the [place]. They had to cancel a trip to [place] planned in May that they have had to cancel. A close friend lost interest in everything and died two months prior to his initial appointment.  Recent Trigger  [facility] presented at the urging of his family.  Marital and Family Information  Present family concerns/problems: [place] [facility] reported that her mother has bipolar disorder. She maintains a good relationship with her father, whom other family members suggest may have ASD.  Strengths/resources in the family/friends: [place] [facility] described supportive relationships with her father an aunt.  Marital/sexual history patterns: [place] has a boyfriend.  Family of Origin  Problems in family of origin: He comes from a lower middle class family in the [place]. He grew up in a home with both parents, his aunt, and sister. His father was deployed for the fist three years of his life.  Family background / ethnic factors: None noted No needs/concerns related to ethnicity reported when asked: No  Education/Vocation  Interpersonal concerns/problems: None reported  Personal strengths: Intelligent, gets along well with others   Military/work problems/concerns: None noted.  Leisure Activities/Daily Functioning  impaired functioning  Legal Status  No Legal Problems  Medical/Nutritional Concerns  [facility] has a history of cardiac problems. He underwent his first heart attack at 51. He has gotten stints and bypass surgery. He most recently suffered a heart attack nearly one year prior to his initial intake session.   unspecified  Comments: adult onset tourettes, asthma, POTS  Substance use/abuse/dependence  unspecified   Comments: 3-4 alcoholic drinks per week. No problems reported.    General Behavior: cooperative  Attire: appropriate  Gait: Not observed-telehealth  Motor Activity: normal  Stream of Thought - Productivity: spontaneous  Stream of thought - Progression: normal  Stream of thought -  Language: normal  Emotional tone and reactions - Mood: normal  Emotional tone and reactions - Affect: appropriate  Mental trend/Content of thoughts - Perception: normal  Mental trend/Content of thoughts - Orientation: normal  Mental trend/Content of thoughts - Memory: normal  Mental trend/Content of thoughts - General knowledge: consistent with education  Insight: good  Judgment: good     Andriette LITTIE Ponto, PhD               Andriette LITTIE Ponto, PhD

## 2024-02-18 ENCOUNTER — Observation Stay (HOSPITAL_COMMUNITY)
Admission: EM | Admit: 2024-02-18 | Source: Home / Self Care | Attending: Emergency Medicine | Admitting: Emergency Medicine

## 2024-02-18 ENCOUNTER — Telehealth: Payer: Self-pay

## 2024-02-18 ENCOUNTER — Other Ambulatory Visit: Payer: Self-pay

## 2024-02-18 ENCOUNTER — Emergency Department (HOSPITAL_COMMUNITY)

## 2024-02-18 ENCOUNTER — Other Ambulatory Visit (HOSPITAL_COMMUNITY): Payer: Self-pay

## 2024-02-18 ENCOUNTER — Other Ambulatory Visit (HOSPITAL_COMMUNITY): Payer: Self-pay | Admitting: *Deleted

## 2024-02-18 DIAGNOSIS — I639 Cerebral infarction, unspecified: Secondary | ICD-10-CM

## 2024-02-18 DIAGNOSIS — N400 Enlarged prostate without lower urinary tract symptoms: Secondary | ICD-10-CM | POA: Diagnosis present

## 2024-02-18 DIAGNOSIS — F329 Major depressive disorder, single episode, unspecified: Secondary | ICD-10-CM | POA: Diagnosis present

## 2024-02-18 DIAGNOSIS — E785 Hyperlipidemia, unspecified: Secondary | ICD-10-CM | POA: Diagnosis present

## 2024-02-18 DIAGNOSIS — R350 Frequency of micturition: Secondary | ICD-10-CM | POA: Diagnosis present

## 2024-02-18 DIAGNOSIS — G4733 Obstructive sleep apnea (adult) (pediatric): Secondary | ICD-10-CM

## 2024-02-18 DIAGNOSIS — Z95818 Presence of other cardiac implants and grafts: Secondary | ICD-10-CM

## 2024-02-18 DIAGNOSIS — I2510 Atherosclerotic heart disease of native coronary artery without angina pectoris: Secondary | ICD-10-CM | POA: Diagnosis present

## 2024-02-18 DIAGNOSIS — I609 Nontraumatic subarachnoid hemorrhage, unspecified: Secondary | ICD-10-CM

## 2024-02-18 DIAGNOSIS — G459 Transient cerebral ischemic attack, unspecified: Secondary | ICD-10-CM | POA: Diagnosis not present

## 2024-02-18 DIAGNOSIS — F411 Generalized anxiety disorder: Secondary | ICD-10-CM

## 2024-02-18 DIAGNOSIS — E039 Hypothyroidism, unspecified: Secondary | ICD-10-CM | POA: Diagnosis present

## 2024-02-18 DIAGNOSIS — R4701 Aphasia: Principal | ICD-10-CM

## 2024-02-18 DIAGNOSIS — R569 Unspecified convulsions: Secondary | ICD-10-CM

## 2024-02-18 DIAGNOSIS — R29700 NIHSS score 0: Secondary | ICD-10-CM

## 2024-02-18 DIAGNOSIS — E119 Type 2 diabetes mellitus without complications: Secondary | ICD-10-CM

## 2024-02-18 DIAGNOSIS — I10 Essential (primary) hypertension: Secondary | ICD-10-CM | POA: Diagnosis present

## 2024-02-18 LAB — URINALYSIS, W/ REFLEX TO CULTURE (INFECTION SUSPECTED)
Bacteria, UA: NONE SEEN
Bilirubin Urine: NEGATIVE
Glucose, UA: NEGATIVE mg/dL
Hgb urine dipstick: NEGATIVE
Ketones, ur: NEGATIVE mg/dL
Leukocytes,Ua: NEGATIVE
Nitrite: NEGATIVE
Protein, ur: NEGATIVE mg/dL
Specific Gravity, Urine: 1.01 (ref 1.005–1.030)
pH: 8 (ref 5.0–8.0)

## 2024-02-18 LAB — PROTIME-INR
INR: 1.1 (ref 0.8–1.2)
Prothrombin Time: 14.8 s (ref 11.4–15.2)

## 2024-02-18 LAB — COMPREHENSIVE METABOLIC PANEL WITH GFR
ALT: 29 U/L (ref 0–44)
AST: 28 U/L (ref 15–41)
Albumin: 3.5 g/dL (ref 3.5–5.0)
Alkaline Phosphatase: 62 U/L (ref 38–126)
Anion gap: 10 (ref 5–15)
BUN: 11 mg/dL (ref 8–23)
CO2: 22 mmol/L (ref 22–32)
Calcium: 7.6 mg/dL — ABNORMAL LOW (ref 8.9–10.3)
Chloride: 104 mmol/L (ref 98–111)
Creatinine, Ser: 0.74 mg/dL (ref 0.61–1.24)
GFR, Estimated: >60 mL/min
Glucose, Bld: 84 mg/dL (ref 70–99)
Potassium: 3.9 mmol/L (ref 3.5–5.1)
Sodium: 137 mmol/L (ref 135–145)
Total Bilirubin: 0.2 mg/dL (ref 0.0–1.2)
Total Protein: 5.4 g/dL — ABNORMAL LOW (ref 6.5–8.1)

## 2024-02-18 LAB — DIFFERENTIAL
Abs Immature Granulocytes: 0.03 10*3/uL (ref 0.00–0.07)
Basophils Absolute: 0 10*3/uL (ref 0.0–0.1)
Basophils Relative: 1 %
Eosinophils Absolute: 0.1 10*3/uL (ref 0.0–0.5)
Eosinophils Relative: 2 %
Immature Granulocytes: 0 %
Lymphocytes Relative: 26 %
Lymphs Abs: 2.1 10*3/uL (ref 0.7–4.0)
Monocytes Absolute: 0.9 10*3/uL (ref 0.1–1.0)
Monocytes Relative: 11 %
Neutro Abs: 4.8 10*3/uL (ref 1.7–7.7)
Neutrophils Relative %: 60 %

## 2024-02-18 LAB — CBC
HCT: 35.8 % — ABNORMAL LOW (ref 39.0–52.0)
Hemoglobin: 11.7 g/dL — ABNORMAL LOW (ref 13.0–17.0)
MCH: 28.9 pg (ref 26.0–34.0)
MCHC: 32.7 g/dL (ref 30.0–36.0)
MCV: 88.4 fL (ref 80.0–100.0)
Platelets: 144 10*3/uL — ABNORMAL LOW (ref 150–400)
RBC: 4.05 MIL/uL — ABNORMAL LOW (ref 4.22–5.81)
RDW: 13.6 % (ref 11.5–15.5)
WBC: 8 10*3/uL (ref 4.0–10.5)
nRBC: 0 % (ref 0.0–0.2)

## 2024-02-18 LAB — I-STAT CHEM 8, ED
BUN: 10 mg/dL (ref 8–23)
Calcium, Ion: 0.95 mmol/L — ABNORMAL LOW (ref 1.15–1.40)
Chloride: 103 mmol/L (ref 98–111)
Creatinine, Ser: 0.8 mg/dL (ref 0.61–1.24)
Glucose, Bld: 83 mg/dL (ref 70–99)
HCT: 34 % — ABNORMAL LOW (ref 39.0–52.0)
Hemoglobin: 11.6 g/dL — ABNORMAL LOW (ref 13.0–17.0)
Potassium: 3.7 mmol/L (ref 3.5–5.1)
Sodium: 139 mmol/L (ref 135–145)
TCO2: 23 mmol/L (ref 22–32)

## 2024-02-18 LAB — APTT: aPTT: 37 s — ABNORMAL HIGH (ref 24–36)

## 2024-02-18 LAB — ETHANOL: Alcohol, Ethyl (B): <15 mg/dL

## 2024-02-18 LAB — CBG MONITORING, ED: Glucose-Capillary: 101 mg/dL — ABNORMAL HIGH (ref 70–99)

## 2024-02-18 MED ORDER — STROKE: EARLY STAGES OF RECOVERY BOOK
Freq: Once | Status: AC
  Administered 2024-02-19: 1
  Filled 2024-02-18: qty 1

## 2024-02-18 MED ORDER — METOPROLOL SUCCINATE ER 25 MG PO TB24
12.5000 mg | ORAL_TABLET | Freq: Every day | ORAL | Status: AC
  Administered 2024-02-19 – 2024-02-20 (×2): 12.5 mg via ORAL
  Filled 2024-02-18 (×2): qty 1

## 2024-02-18 MED ORDER — IOHEXOL 350 MG/ML SOLN
75.0000 mL | Freq: Once | INTRAVENOUS | Status: AC | PRN
  Administered 2024-02-18: 75 mL via INTRAVENOUS

## 2024-02-18 MED ORDER — SODIUM CHLORIDE 0.9% FLUSH
3.0000 mL | Freq: Once | INTRAVENOUS | Status: AC
  Administered 2024-02-18: 3 mL via INTRAVENOUS

## 2024-02-18 MED ORDER — ACETAMINOPHEN 650 MG RE SUPP: 650.0000 mg | RECTAL | Status: AC | PRN

## 2024-02-18 MED ORDER — LEVOTHYROXINE SODIUM 75 MCG PO TABS
75.0000 ug | ORAL_TABLET | Freq: Every day | ORAL | Status: AC
  Administered 2024-02-19 – 2024-02-20 (×2): 75 ug via ORAL
  Filled 2024-02-18 (×2): qty 1

## 2024-02-18 MED ORDER — LIOTHYRONINE SODIUM 5 MCG PO TABS
10.0000 ug | ORAL_TABLET | Freq: Every day | ORAL | Status: AC
  Administered 2024-02-19 – 2024-02-20 (×2): 10 ug via ORAL
  Filled 2024-02-18 (×2): qty 2

## 2024-02-18 MED ORDER — PAROXETINE HCL 10 MG PO TABS
10.0000 mg | ORAL_TABLET | Freq: Every day | ORAL | Status: AC
  Administered 2024-02-19 – 2024-02-20 (×2): 10 mg via ORAL
  Filled 2024-02-18 (×2): qty 1

## 2024-02-18 MED ORDER — VITAMIN D 25 MCG (1000 UNIT) PO TABS
10000.0000 [IU] | ORAL_TABLET | Freq: Every day | ORAL | Status: AC
  Administered 2024-02-19 – 2024-02-20 (×2): 10000 [IU] via ORAL
  Filled 2024-02-18 (×2): qty 10

## 2024-02-18 MED ORDER — SERTRALINE HCL 50 MG PO TABS
50.0000 mg | ORAL_TABLET | Freq: Every day | ORAL | Status: AC
  Administered 2024-02-19 – 2024-02-20 (×2): 50 mg via ORAL
  Filled 2024-02-18 (×2): qty 1

## 2024-02-18 MED ORDER — ACETAMINOPHEN 160 MG/5ML PO SOLN: 650.0000 mg | ORAL | Status: AC | PRN

## 2024-02-18 MED ORDER — FINASTERIDE 5 MG PO TABS
5.0000 mg | ORAL_TABLET | Freq: Every day | ORAL | Status: AC
  Administered 2024-02-19 – 2024-02-20 (×2): 5 mg via ORAL
  Filled 2024-02-18 (×2): qty 1

## 2024-02-18 MED ORDER — ACETAMINOPHEN 325 MG PO TABS
650.0000 mg | ORAL_TABLET | ORAL | Status: AC | PRN
  Filled 2024-02-18: qty 2

## 2024-02-18 MED ORDER — EZETIMIBE 10 MG PO TABS
10.0000 mg | ORAL_TABLET | Freq: Every day | ORAL | Status: AC
  Administered 2024-02-19 – 2024-02-20 (×2): 10 mg via ORAL
  Filled 2024-02-18 (×2): qty 1

## 2024-02-18 MED ORDER — LORAZEPAM 1 MG PO TABS
0.5000 mg | ORAL_TABLET | Freq: Two times a day (BID) | ORAL | Status: AC
  Administered 2024-02-18 – 2024-02-20 (×5): 0.5 mg via ORAL
  Filled 2024-02-18 (×5): qty 1

## 2024-02-18 MED ORDER — ATORVASTATIN CALCIUM 80 MG PO TABS
80.0000 mg | ORAL_TABLET | Freq: Every day | ORAL | Status: AC
  Administered 2024-02-19 – 2024-02-20 (×2): 80 mg via ORAL
  Filled 2024-02-18: qty 2
  Filled 2024-02-18: qty 1

## 2024-02-18 MED ORDER — VALPROIC ACID 250 MG PO CAPS
750.0000 mg | ORAL_CAPSULE | Freq: Two times a day (BID) | ORAL | Status: DC
  Administered 2024-02-18: 750 mg via ORAL
  Filled 2024-02-18 (×2): qty 3

## 2024-02-18 MED ORDER — HEPARIN (PORCINE) 25000 UT/250ML-% IV SOLN
1300.0000 [IU]/h | INTRAVENOUS | Status: DC
  Administered 2024-02-18: 1100 [IU]/h via INTRAVENOUS
  Administered 2024-02-19: 1300 [IU]/h via INTRAVENOUS
  Filled 2024-02-18 (×2): qty 250

## 2024-02-18 MED ORDER — DIVALPROEX SODIUM 250 MG PO DR TAB
750.0000 mg | DELAYED_RELEASE_TABLET | Freq: Two times a day (BID) | ORAL | Status: AC
  Administered 2024-02-18 – 2024-02-20 (×5): 750 mg via ORAL
  Filled 2024-02-18 (×5): qty 3

## 2024-02-18 NOTE — ED Notes (Signed)
 Pt noted to be incontinent of urine. Dysarthria and aphasia symptoms returned. MD Tegeler at bedside, secretary notified to activate code stroke

## 2024-02-18 NOTE — Telephone Encounter (Signed)
 Copied from CRM [id number]. Topic: Clinical - Medication Question >> Feb 18, 2024  2:02 PM Mercedes MATSU wrote: Reason for CRM: Patients wife is requesting a call back to ask questions about most recent refill, she does not know the nameof the medication and can be reached at [phone]

## 2024-02-18 NOTE — ED Triage Notes (Signed)
 Pt bib [facility] from [facility] c/o aphasia and dysarthria, beginning 1420, last 10-15 minutes. LKW 1245. Hx of hemorrhagic stroke. Stent placed in December. No deficts from previous strokes  174/78 56hr 98% ra 126 cbg 18 LAC

## 2024-02-18 NOTE — Consult Note (Addendum)
 NEUROLOGY CONSULT NOTE   Date of service: February 18, 2024 Patient Name: Raymond Christensen MRN:  [id number] DOB:  [birth year] Chief Complaint: PIEDAD STROKE Requesting Provider: Ginger Lonni PARAS, *  History of Present Illness  Dennise Bamber is a 84 y.o. 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 CODE STROKE activated by EDP with return of aphasia, that then resolved during assessment.   According to EMS report, patient went to bed at 12:30 PM after lunch to take a nap and was doing well.  He woke up and was having significant aphasia.  He had no deficits before his nap and could not speak.  There was no reported unilateral numbness or weakness but he could not get anything 1 to say out. EMS reports that during transport he had returned to baseline and was speaking completely normally. He did urinate on himself and it was unclear if he just could not hold his bladder or if he had incontinence although EMS did not report any recent seizure activity that they could see. Upon getting into his exam room at 1558, EMS noticed return to his aphasia where he could not talk. This lasted for several minutes. Patient began to answer some question but still was not completely back to his baseline. Decision made to activate a code stroke due to the recurrent aphasia.   On exam in  CT, patient had returned to baseline with no aphasia, no other neurological deficits. There was no seizure activity witnessed, patient does not appear post-ictal.   LKW: 1245 Modified rankin score: 0-Completely asymptomatic and back to baseline post- stroke IV Thrombolysis: No, resolution of symptoms EVT: No, no LVO   NIHSS components Score: Comment  1a Level of Conscious 0[]  1[]  2[]  3[]      1b LOC Questions 0[]  1[]  2[]       1c LOC Commands 0[]  1[]  2[]       2 Best Gaze 0[]  1[]  2[]       3 Visual 0[]  1[]  2[]  3[]      4 Facial  Palsy 0[]  1[]  2[]  3[]      5a Motor Arm - left 0[]  1[]  2[]  3[]  4[]  UN[]    5b Motor Arm - Right 0[]  1[]  2[]  3[]  4[]  UN[]    6a Motor Leg - Left 0[]  1[]  2[]  3[]  4[]  UN[]    6b Motor Leg - Right 0[]  1[]  2[]  3[]  4[]  UN[]    7 Limb Ataxia 0[]  1[]  2[]  UN[]      8 Sensory 0[]  1[]  2[]  UN[]      9 Best Language 0[]  1[]  2[]  3[]      10 Dysarthria 0[]  1[]  2[]  UN[]      11 Extinct. and Inattention 0[]  1[]  2[]       TOTAL:   0      ROS  Comprehensive ROS performed and pertinent positives documented in HPI   Past History   Past Medical History:  Diagnosis Date   Arthritis    CAD (coronary artery disease)    Cancer (HCC)    skin lt. arm.   Carotid artery disease    s/p left CEA 01/17/14   Chronic systolic CHF (congestive heart failure) [facility])    Concussion Summer 2012   Essential hypertension 02/26/2017   Heart disease    Hyperlipidemia    Hypothyroidism 10/15/2017   Major depressive disorder    Mitral valve regurgitation    Myocardial infarction (HCC) 1996, 2021  Obstructive sleep apnea on CPAP    Persistent atrial fibrillation (HCC)    Poor flexibility of tendon 12/01/2018   S/P mitral valve clip implantation 12/23/2019   s/p TEER with MitraClip with one XTW by Dr. Wonda   Subungual hematoma of digit of hand 12/01/2018   Subungual hematoma of right foot 12/01/2018   Type 2 diabetes mellitus without complication, without long-term current use of insulin  (HCC) 02/26/2017    Past Surgical History:  Procedure Laterality Date   ATRIAL FIBRILLATION ABLATION N/A 02/01/2020   Procedure: ATRIAL FIBRILLATION ABLATION;  Surgeon: Inocencio Soyla Lunger, MD;  Location: [facility] INVASIVE CV LAB;  Service: Cardiovascular;  Laterality: N/A;   BYPASS GRAFT  2001   CARDIAC CATHETERIZATION     CARDIOVERSION N/A 11/08/2019   Procedure: CARDIOVERSION;  Surgeon: Delford Maude BROCKS, MD;  Location: [facility] ENDOSCOPY;  Service: Cardiovascular;  Laterality: N/A;   CAROTID ENDARTERECTOMY Left 01/17/2014   CORONARY ANGIOPLASTY      CORONARY ARTERY BYPASS GRAFT     LEFT HEART CATH AND CORS/GRAFTS ANGIOGRAPHY N/A 06/26/2022   Procedure: LEFT HEART CATH AND CORS/GRAFTS ANGIOGRAPHY;  Surgeon: Burnard Debby LABOR, MD;  Location: [facility] INVASIVE CV LAB;  Service: Cardiovascular;  Laterality: N/A;   RIGHT/LEFT HEART CATH AND CORONARY/GRAFT ANGIOGRAPHY N/A 09/09/2019   Procedure: RIGHT/LEFT HEART CATH AND CORONARY/GRAFT ANGIOGRAPHY;  Surgeon: Court Dorn PARAS, MD;  Location: [facility] INVASIVE CV LAB;  Service: Cardiovascular;  Laterality: N/A;   TEE WITHOUT CARDIOVERSION N/A 09/13/2019   Procedure: TRANSESOPHAGEAL ECHOCARDIOGRAM (TEE);  Surgeon: Lonni Slain, MD;  Location: [facility] ENDOSCOPY;  Service: Cardiovascular;  Laterality: N/A;   TEE WITHOUT CARDIOVERSION N/A 12/23/2019   Procedure: TRANSESOPHAGEAL ECHOCARDIOGRAM (TEE);  Surgeon: Wonda Sharper, MD;  Location: [facility] INVASIVE CV LAB;  Service: Cardiovascular;  Laterality: N/A;   TEE WITHOUT CARDIOVERSION  02/01/2020   Procedure: TRANSESOPHAGEAL ECHOCARDIOGRAM (TEE);  Surgeon: Inocencio Soyla Lunger, MD;  Location: [facility] INVASIVE CV LAB;  Service: Cardiovascular;;   TOTAL HIP ARTHROPLASTY  1994, 1995   TOTAL HIP ARTHROPLASTY Bilateral    TRANSCATHETER MITRAL EDGE TO EDGE REPAIR N/A 12/23/2019   Procedure: MITRAL VALVE REPAIR;  Surgeon: Wonda Sharper, MD;  Location: [facility] INVASIVE CV LAB;  Service: Cardiovascular;  Laterality: N/A;   TRANSURETHRAL RESECTION OF PROSTATE N/A 12/27/2022   Procedure: TRANSURETHRAL RESECTION OF THE PROSTATE (TURP);  Surgeon: Nieves Cough, MD;  Location: [facility] ORS;  Service: Urology;  Laterality: N/A;  75 minute case    Family History: Family History  Problem Relation Age of Onset   Dementia Father        Concerns surrounding Lewy body dementia, but never officially diagnosed   Cancer Neg Hx     Social History  reports that he quit smoking about 46 years ago. His smoking use included cigarettes. He has never used smokeless tobacco. He reports current  alcohol use of about 1.0 standard drink of alcohol per week. He reports that he does not use drugs.  Allergies[1]  Medications  Current Medications[2]  Vitals   There were no vitals filed for this visit.  There is no height or weight on file to calculate BMI.   Physical Exam   Constitutional: Appears well-developed and well-nourished.  Neurologic Examination   Neuro: Mental Status: Patient is awake, alert, oriented to person, place, month, year, and situation. Patient is able to give a clear and coherent history. No signs of aphasia or neglect Cranial Nerves: II: Visual Fields are full. Pupils are equal, round, and reactive to light.   III,IV,  VI: EOMI without ptosis or diploplia.  V: Facial sensation is symmetric to light touch VII: Facial movement is symmetric.  VIII: hearing is intact to voice X: Uvula elevates symmetrically XI: Shoulder shrug is symmetric. XII: tongue is midline without atrophy or fasciculations.  Motor: Tone is normal. Bulk is normal. 5/5 strength was present in all four extremities.  Sensory: Sensation is symmetric to light touch in the arms and legs. Cerebellar: FNF and HKS are intact bilaterally   Labs/Imaging/Neurodiagnostic studies   CBC: No results for input(s): WBC, NEUTROABS, HGB, HCT, MCV, PLT in the last 168 hours. Basic Metabolic Panel:  Lab Results  Component Value Date   NA 135 02/06/2024   K 4.3 02/06/2024   CO2 31 02/06/2024   GLUCOSE 86 02/06/2024   BUN 15 02/06/2024   CREATININE 0.76 02/06/2024   CALCIUM  9.2 02/06/2024   GFRNONAA >60 02/03/2024   GFRAA 49 (L) 01/13/2020   Lipid Panel:  Lab Results  Component Value Date   LDLCALC 48 12/22/2023   HgbA1c:  Lab Results  Component Value Date   HGBA1C 5.5 12/22/2023   Urine Drug Screen:     Component Value Date/Time   LABOPIA NONE DETECTED 12/26/2023 1221   COCAINSCRNUR NONE DETECTED 12/26/2023 1221   LABBENZ NONE DETECTED 12/26/2023 1221   AMPHETMU  NONE DETECTED 12/26/2023 1221   THCU NONE DETECTED 12/26/2023 1221   LABBARB NONE DETECTED 12/26/2023 1221    Alcohol Level     Component Value Date/Time   ETH <15 02/01/2024 1800   INR  Lab Results  Component Value Date   INR 0.9 02/01/2024   APTT  Lab Results  Component Value Date   APTT 35 02/01/2024   AED levels: No results found for: PHENYTOIN, ZONISAMIDE, LAMOTRIGINE, LEVETIRACETA  CT Head without contrast(Personally reviewed): No new areas of intracranial hemorrhage. ASPECTS 10. Left frontoparietal subdural hematoma measuring up to 2 mm in thickness, not significantly changed. Trace subarachnoid hemorrhagealong the left sylvian fissure and within the left occipital lobe, decreased from prior.  CT angio Head and Neck with contrast(Personally reviewed): No large vessel occlusion. Unchanged intracranial atherosclerosis including a moderate to severe left A1 stenosis and mild bilateral supraclinoid ICA stenoses. Cervical carotid atherosclerosis without significant stenosis.  MRI Brain(Personally reviewed): ordered  ASSESSMENT   Mukund Weinreb is a 84 y.o. male 83 y.o. male with hx of Traumatic SDH/SAH (12/2023), Afib (Eliquis  stopped post-SAH), HTN, HLD, CAD s/p CABG, MI who was BIB EMS  due to transient aphasia. CODE STROKE activated by EDP with return of aphasia, that then resolved during assessment.   CTH negative. CTA negative. No aphasia noted on neurology exam during code stroke. Patient had urinary incontinence, but stated it was just because he couldn't hold it as opposed to not knowing he was going. There was no witnessed seizure activity, no tongue or mouth abrasions.   Dx: Stroke versus TIA   RECOMMENDATIONS   - MRI Brain  Further plan based on results - r/o infection and electrolyte disturbances per primary -Continue home Depakote  750 twice daily(this should be divalproex  not  Depakene ) ______________________________________________________________________   Signed, Rocky JAYSON Likes, NP [facility]   I have seen the patient and reviewed the above note.  He had two episodes of dysarthria, without aphasia.  He did not have any change in level of consciousness, and did not have any other symptoms concerning for seizure.  He has a history of paroxysmal atrial fibrillation and has had anticoagulation held since his hemorrhage back  in December.  With no aneurysm, and a fall preceding this it was felt to be most likely traumatic.  Currently that hemorrhage is almost completely resolved, with no evidence of recurrent hemorrhage.  At this point I think it would be most prudent to restart anticoagulation but I would favor doing so in a controlled environment and therefore would recommend starting stroke protocol heparin  with plan for repeat CT in the morning.  An EEG is also reasonable, but my suspicion for seizure is very low.  Also would check lipids, A1c, MRI.  I do not think we need to repeat his echo given he recently had one.  Aisha Seals, MD [facility]   If 7pm- 7am, please page neurology on call as listed in AMION.      [1]  Allergies Allergen Reactions   Sulfa Antibiotics Rash    Questionable, he developed a diffuse rash 2 days after stopping Bactrim  [2]  Current Facility-Administered Medications:    sodium chloride  flush (NS) 0.9 % injection 3 mL, 3 mL, Intravenous, Once, Tegeler, Lonni PARAS, MD  Current Outpatient Medications:    acetaminophen  (TYLENOL ) 500 MG tablet, Take 1,000 mg by mouth daily., Disp: , Rfl:    atorvastatin  (LIPITOR ) 80 MG tablet, TAKE 1 TABLET BY MOUTH DAILY, Disp: 90 tablet, Rfl: 3   Cholecalciferol  (VITAMIN D3) 250 MCG (10000 UT) TABS, Take 1 tablet by mouth daily., Disp: , Rfl:    ezetimibe  (ZETIA ) 10 MG tablet, TAKE 1 TABLET BY MOUTH DAILY, Disp: 90 tablet, Rfl: 3   famotidine  (PEPCID ) 20 MG  tablet, Take 20 mg by mouth daily., Disp: , Rfl:    finasteride  (PROSCAR ) 5 MG tablet, Take 1 tablet (5 mg total) by mouth daily., Disp: 90 tablet, Rfl: 3   levothyroxine  (SYNTHROID ) 75 MCG tablet, Take 1 tablet (75 mcg total) by mouth daily., Disp: 90 tablet, Rfl: 1   liothyronine  (CYTOMEL ) 5 MCG tablet, TAKE 2 TABLETS BY MOUTH DAILY, Disp: 180 tablet, Rfl: 1   LORazepam  (ATIVAN ) 0.5 MG tablet, 1 bid, Disp: 60 tablet, Rfl: 4   metoprolol  succinate (TOPROL -XL) 25 MG 24 hr tablet, Take 0.5 tablets (12.5 mg total) by mouth daily., Disp: 45 tablet, Rfl: 3   Multiple Vitamins-Minerals (MULTIVITAMIN ADULT EXTRA C PO), Take 1 tablet by mouth daily. , Disp: , Rfl:    nitroGLYCERIN  (NITROSTAT ) 0.4 MG SL tablet, Place 1 tablet (0.4 mg total) under the tongue every 5 (five) minutes as needed for chest pain., Disp: 30 tablet, Rfl: 12   PARoxetine  (PAXIL ) 10 MG tablet, TAKE 1 TABLET BY MOUTH DAILY, Disp: 90 tablet, Rfl: 0   polyethylene glycol powder (GLYCOLAX /MIRALAX ) 17 GM/SCOOP powder, Dissolve 17 g as directed and take by mouth daily. Titrate as needed for a soft bowel movement daily, Disp: 238 g, Rfl: 0   sertraline  (ZOLOFT ) 50 MG tablet, Take 1 tablet (50 mg total) by mouth daily., Disp: 30 tablet, Rfl: 5   UNABLE TO FIND, Place into the left eye in the morning, at noon, in the evening, and at bedtime. Prednisolone  Phosphate 1% /  Moxifloxacin  0.5% / Bromfenac  0.075% Sterile Ophthalmic Solution, Disp: , Rfl:    valproic  acid (DEPAKENE ) 250 MG capsule, Take 3 capsules (750 mg total) by mouth 2 (two) times daily., Disp: 180 capsule, Rfl: 0

## 2024-02-18 NOTE — Progress Notes (Addendum)
 ANTICOAGULATION CONSULT NOTE  Pharmacy Consult for Heparin  Indication: atrial fibrillation and stroke  Allergies[1]  Patient Measurements: Height: 5' 9 (175.3 cm) Weight: 98.6 kg (217 lb 6 oz) IBW/kg (Calculated) : 70.7 Heparin  Dosing Weight: 91.4 kg  Vital Signs: Temp: 97.9 F (36.6 C) (02/04 1600) Temp Source: Oral (02/04 1600) BP: 177/72 (02/04 1745) Pulse Rate: 55 (02/04 1745)  Labs: Recent Labs    02/18/24 1629 02/18/24 1634  HGB 11.7* 11.6*  HCT 35.8* 34.0*  PLT 144*  --   APTT 37*  --   LABPROT 14.8  --   INR 1.1  --   CREATININE 0.74 0.80    Estimated Creatinine Clearance: 82.5 mL/min (by C-G formula based on SCr of 0.8 mg/dL).   Medical History: Past Medical History:  Diagnosis Date   Arthritis    CAD (coronary artery disease)    Cancer (HCC)    skin lt. arm.   Carotid artery disease    s/p left CEA 01/17/14   Chronic systolic CHF (congestive heart failure) (HCC)    Concussion Summer 2012   Essential hypertension 02/26/2017   Heart disease    Hyperlipidemia    Hypothyroidism 10/15/2017   Major depressive disorder    Mitral valve regurgitation    Myocardial infarction [facility]) 1996, 2021   Obstructive sleep apnea on CPAP    Persistent atrial fibrillation (HCC)    Poor flexibility of tendon 12/01/2018   S/P mitral valve clip implantation 12/23/2019   s/p TEER with MitraClip with one XTW by Dr. Wonda   Subungual hematoma of digit of hand 12/01/2018   Subungual hematoma of right foot 12/01/2018   Type 2 diabetes mellitus without complication, without long-term current use of insulin  (HCC) 02/26/2017    Assessment: 83 yom with a history of SAH, AF (previously on eliquis  which was stopped 2/2 to [facility]), HTN, HLD, CAD, CABG. Patient is presenting as stroke activation. Heparin  per pharmacy consult placed for atrial fibrillation and stroke. MRI pending. Neurology would like re-start anticoagulation now but with conservative stroke protocol dosing and level  monitoring goals.  Patient is not on anticoagulation prior to arrival.  Hgb 11.6; plt 144 aPTT 37 PT/INR 14.8/1.1  Goal of Therapy:  Heparin  level 0.3-0.5 units/ml Monitor platelets by anticoagulation protocol: Yes   Plan:  No initial heparin  bolus Start heparin  infusion at 1100 units/hr Check anti-Xa level at 2AM Daily HL while on heparin  Continue to monitor H&H and platelets  F/u MRI, repeat CT, and plan for [facility] post-heparin   Dorn Buttner, PharmD, BCPS 02/18/2024 7:19 PM ED Clinical Pharmacist -  [phone]        [1]  Allergies Allergen Reactions   Sulfa Antibiotics Rash    Questionable, he developed a diffuse rash 2 days after stopping Bactrim

## 2024-02-18 NOTE — Telephone Encounter (Signed)
 Copied from CRM [id number]. Topic: Clinical - Medication Question >> Feb 18, 2024  2:02 PM Mercedes MATSU wrote: Reason for CRM: Patients wife is requesting a call back to ask questions about most recent refill, she does not know the nameof the medication and can be reached at [phone]  Called spoke with pt wife and was advised not sure  what medication she was talking about, wife said she not sure and nvm.

## 2024-02-18 NOTE — ED Provider Notes (Signed)
 " [place] EMERGENCY DEPARTMENT AT [facility] Provider Note   CSN: [id number] Arrival date & time: 02/18/24  1555     Patient presents with: Transient Ischemic Attack   Raymond Christensen is a 84 y.o. male.   The history is provided by the patient, the EMS personnel and medical records. The history is limited by the condition of the patient. No language interpreter was used.  Neurologic Problem This is a recurrent problem. The current episode started less than 1 hour ago. The problem occurs constantly. The problem has not changed since onset.Pertinent negatives include no chest pain, no abdominal pain, no headaches and no shortness of breath. Nothing aggravates the symptoms. Nothing relieves the symptoms. He has tried nothing for the symptoms. The treatment provided no relief.       Prior to Admission medications  Medication Sig Start Date End Date Taking? Authorizing Provider  acetaminophen  (TYLENOL ) 500 MG tablet Take 1,000 mg by mouth daily.    [provider]  atorvastatin  (LIPITOR ) 80 MG tablet TAKE 1 TABLET BY MOUTH DAILY 12/04/23   Chandrasekhar, Mahesh A, MD  Cholecalciferol  (VITAMIN D3) 250 MCG (10000 UT) TABS Take 1 tablet by mouth daily.    [provider]  ezetimibe  (ZETIA ) 10 MG tablet TAKE 1 TABLET BY MOUTH DAILY 12/04/23   Chandrasekhar, Mahesh A, MD  famotidine  (PEPCID ) 20 MG tablet Take 20 mg by mouth daily.    [provider]  finasteride  (PROSCAR ) 5 MG tablet Take 1 tablet (5 mg total) by mouth daily. 10/16/22   Shona Layman BROCKS, MD  levothyroxine  (SYNTHROID ) 75 MCG tablet Take 1 tablet (75 mcg total) by mouth daily. 02/04/24   Frann Mabel Mt, DO  liothyronine  (CYTOMEL ) 5 MCG tablet TAKE 2 TABLETS BY MOUTH DAILY 08/27/23   Frann Mabel Mt, DO  LORazepam  (ATIVAN ) 0.5 MG tablet 1 bid 07/29/23   Plovsky, Elna, MD  metoprolol  succinate (TOPROL -XL) 25 MG 24 hr tablet Take 0.5 tablets (12.5 mg total) by mouth daily. 02/04/24    Frann Mabel Mt, DO  Multiple Vitamins-Minerals (MULTIVITAMIN ADULT EXTRA C PO) Take 1 tablet by mouth daily.     [provider]  nitroGLYCERIN  (NITROSTAT ) 0.4 MG SL tablet Place 1 tablet (0.4 mg total) under the tongue every 5 (five) minutes as needed for chest pain. 06/21/22   Frann Mabel Mt, DO  PARoxetine  (PAXIL ) 10 MG tablet TAKE 1 TABLET BY MOUTH DAILY 12/01/23   Wendling, Mabel Mt, DO  polyethylene glycol powder (GLYCOLAX /MIRALAX ) 17 GM/SCOOP powder Dissolve 17 g as directed and take by mouth daily. Titrate as needed for a soft bowel movement daily 01/12/24   Perri DELENA Meliton Mickey., MD  sertraline  (ZOLOFT ) 50 MG tablet Take 1 tablet (50 mg total) by mouth daily. 01/13/24   Plovsky, Elna, MD  UNABLE TO FIND Place into the left eye in the morning, at noon, in the evening, and at bedtime. Prednisolone  Phosphate 1% /  Moxifloxacin  0.5% / Bromfenac  0.075% Sterile Ophthalmic Solution    [provider]  valproic  acid (DEPAKENE ) 250 MG capsule Take 3 capsules (750 mg total) by mouth 2 (two) times daily. 02/16/24   Frann Mabel Mt, DO    Allergies: Sulfa antibiotics    Review of Systems  Constitutional:  Negative for chills, fatigue and fever.  HENT:  Negative for congestion.   Eyes:  Negative for visual disturbance.  Respiratory:  Negative for cough, chest tightness and shortness of breath.   Cardiovascular:  Negative for chest pain,  palpitations and leg swelling.  Gastrointestinal:  Negative for abdominal pain, constipation, diarrhea, nausea and vomiting.  Genitourinary:  Positive for frequency. Negative for dysuria.  Musculoskeletal:  Negative for back pain, neck pain and neck stiffness.  Skin:  Negative for rash and wound.  Neurological:  Positive for speech difficulty. Negative for dizziness, weakness, light-headedness, numbness and headaches.  Psychiatric/Behavioral:  Negative for agitation and confusion.   All other systems reviewed and  are negative.   Updated Vital Signs There were no vitals taken for this visit.  Physical Exam Vitals and nursing note reviewed.  Constitutional:      General: He is not in acute distress.    Appearance: He is well-developed. He is ill-appearing. He is not toxic-appearing or diaphoretic.  HENT:     Head: Normocephalic and atraumatic.     Nose: No congestion or rhinorrhea.     Mouth/Throat:     Mouth: Mucous membranes are moist.  Eyes:     General: No visual field deficit.    Extraocular Movements: Extraocular movements intact.     Conjunctiva/sclera: Conjunctivae normal.     Pupils: Pupils are equal, round, and reactive to light.  Cardiovascular:     Rate and Rhythm: Normal rate and regular rhythm.     Heart sounds: No murmur heard. Pulmonary:     Effort: Pulmonary effort is normal. No respiratory distress.     Breath sounds: Normal breath sounds. No wheezing, rhonchi or rales.  Chest:     Chest wall: No tenderness.  Abdominal:     Palpations: Abdomen is soft.     Tenderness: There is no abdominal tenderness. There is no guarding or rebound.  Musculoskeletal:        General: No swelling.     Cervical back: Neck supple.  Skin:    General: Skin is warm and dry.     Capillary Refill: Capillary refill takes less than 2 seconds.  Neurological:     Mental Status: He is alert.     Cranial Nerves: No facial asymmetry.     Sensory: No sensory deficit.     Motor: No weakness, abnormal muscle tone or seizure activity.     Coordination: Finger-Nose-Finger Test normal.     Comments: Patient does have aphasia on my initial examination.  No other focal deficits on my exam.  Psychiatric:        Mood and Affect: Mood normal.     (all labs ordered are listed, but only abnormal results are displayed) Labs Reviewed  APTT - Abnormal; Notable for the following components:      Result Value   aPTT 37 (*)    All other components within normal limits  CBC - Abnormal; Notable for the  following components:   RBC 4.05 (*)    Hemoglobin 11.7 (*)    HCT 35.8 (*)    Platelets 144 (*)    All other components within normal limits  COMPREHENSIVE METABOLIC PANEL WITH GFR - Abnormal; Notable for the following components:   Calcium  7.6 (*)    Total Protein 5.4 (*)    All other components within normal limits  CBG MONITORING, ED - Abnormal; Notable for the following components:   Glucose-Capillary 101 (*)    All other components within normal limits  I-STAT CHEM 8, ED - Abnormal; Notable for the following components:   Calcium , Ion 0.95 (*)    Hemoglobin 11.6 (*)    HCT 34.0 (*)    All other components within  normal limits  PROTIME-INR  DIFFERENTIAL  ETHANOL  CBC  HEPARIN  LEVEL (UNFRACTIONATED)  URINALYSIS, W/ REFLEX TO CULTURE (INFECTION SUSPECTED)  CBG MONITORING, ED    EKG: EKG Interpretation Date/Time:  Wednesday February 18 2024 16:35:31 EST Ventricular Rate:  59 PR Interval:  203 QRS Duration:  113 QT Interval:  430 QTC Calculation: 426 R Axis:   34  Text Interpretation: Sinus rhythm Inferior infarct, old when compared to prior, similar appearance No STEMI Confirmed by Ginger Barefoot ([id number]) on 02/18/2024 4:43:18 PM  Radiology: CT ANGIO HEAD NECK W WO CM (CODE STROKE) Result Date: 02/18/2024 EXAM: CTA HEAD AND NECK WITH AND WITHOUT 02/18/2024 04:21:52 PM TECHNIQUE: CTA of the head and neck was performed with and without the administration of 75 mL of iohexol  (OMNIPAQUE ) 350 MG/ML injection. Multiplanar 2D and/or 3D reformatted images are provided for review. Automated exposure control, iterative reconstruction, and/or weight based adjustment of the mA/kV was utilized to reduce the radiation dose to as low as reasonably achievable. Stenosis of the internal carotid arteries measured using NASCET criteria. COMPARISON: CTA head and neck 12/26/2023 CLINICAL HISTORY: Neuro deficit, acute, stroke suspected. FINDINGS: CTA NECK: AORTIC ARCH AND ARCH VESSELS: Mild to  moderate calcified and soft plaque in the aortic arch. No dissection or arterial injury. No significant stenosis of the brachiocephalic or subclavian arteries. CERVICAL CAROTID ARTERIES: Mixed calcified and soft plaque about both carotid bifurcations. Previous left carotid endarterectomy. No dissection or arterial injury. No hemodynamically significant stenosis by NASCET criteria. CERVICAL VERTEBRAL ARTERIES: The vertebral arteries are patent with the right being strongly dominant and the left being diffusely hypoplastic. Venous contrast limits assessment of the left V1 segment, however there is no evidence of a significant stenosis or dissection elsewhere on either side. LUNGS AND MEDIASTINUM: Unremarkable. SOFT TISSUES: No acute abnormality. BONES: Moderate cervical spondylosis and advanced facet arthrosis. CTA HEAD: ANTERIOR CIRCULATION: The intracranial internal carotid arteries are patent with calcified plaque resulting in mild supraclinoid stenoses bilaterally, unchanged. The anterior cerebral arteries (ACAs) and middle cerebral arteries (MCAs) are patent without evidence of a proximal branch lesion or significant M1 or right A1 stenosis. A moderate to severe left A1 stenosis is unchanged. No aneurysm. POSTERIOR CIRCULATION: The intracranial vertebral arteries are patent to the basilar. Patent anterior inferior cerebellar artery (AICA) and superior cerebellar artery (SCA) origins are visualized bilaterally. The basilar artery is patent and mildly irregular without a significant stenosis. Posterior communicating arteries are diminutive or absent. The posterior cerebral arteries (PCAs) are patent with mild atherosclerotic irregularity bilaterally including a mild proximal to mid left P2 stenosis. No aneurysm. OTHER: No dural venous sinus thrombosis on this non-dedicated study. The finding of no large vessel occlusion was communicated to Dr. EMERSON Seals at 4:36 PM on 02/18/2024 by secure text page via the  [facility] messaging system. IMPRESSION: 1. No large vessel occlusion. 2. Unchanged intracranial atherosclerosis including a moderate to severe left A1 stenosis and mild bilateral supraclinoid ICA stenoses. 3. Cervical carotid atherosclerosis without significant stenosis. 4. Aortic Atherosclerosis (ICD10-I70.0). Electronically signed by: Dasie Hamburg MD 02/18/2024 04:48 PM EST RP Workstation: [id number]   CT HEAD CODE STROKE WO CONTRAST Result Date: 02/18/2024 EXAM: CT HEAD WITHOUT CONTRAST 02/18/2024 04:14:11 PM TECHNIQUE: CT of the head was performed without the administration of intravenous contrast. Automated exposure control, iterative reconstruction, and/or weight based adjustment of the mA/kV was utilized to reduce the radiation dose to as low as reasonably achievable. COMPARISON: 12/02/2024 and 12/01/2024. CLINICAL HISTORY: Neuro deficit, acute, stroke suspected. Acute neurologic  deficit; stroke suspected. FINDINGS: BRAIN AND VENTRICLES: There is a left frontoparietal subdural hematoma measuring up to 2 mm in thickness which is not significantly changed from the prior studies. There is trace subarachnoid hemorrhage along the left sylvian fissure and within the left occipital lobe which is decreased from prior. There are no new or enlarging foci of intracranial hemorrhage. Similar generalized parenchymal volume loss. Similar appearance of extra-axial hypoattenuating fluid in the posterior fossa. Chronic microvascular ischemic changes. Atherosclerosis in the carotid siphons and intracranial right vertebral artery. No evidence of acute infarct. No hydrocephalus. No mass effect or midline shift. [place] Stroke Program Early CT (ASPECT) score: Ganglionic (caudate, IC, lentiform nucleus, insula, M1-M3): 7 Supraganglionic (M4-M6): 3 Total: 10 ORBITS: Left lens replacement. SINUSES: No acute abnormality. SOFT TISSUES AND SKULL: No acute soft tissue abnormality. No skull fracture. IMPRESSION: 1. No new areas of  intracranial hemorrhage. 2. ASPECTS 10. 3. Left frontoparietal subdural hematoma measuring up to 2 mm in thickness, not significantly changed. 4. Trace subarachnoid hemorrhage along the left sylvian fissure and within the left occipital lobe, decreased from prior. 5. Findings messaged to Dr. Michaela via the [facility] messaging system at 4:27 PM on 02/18/24. Electronically signed by: Donnice Mania MD 02/18/2024 04:28 PM EST RP Workstation: [id number]     Procedures   CRITICAL CARE Performed by: Lonni PARAS Jesalyn Finazzo Total critical care time: 35 minutes Critical care time was exclusive of separately billable procedures and treating other patients. Critical care was necessary to treat or prevent imminent or life-threatening deterioration. Critical care was time spent personally by me on the following activities: development of treatment plan with patient and/or surrogate as well as nursing, discussions with consultants, evaluation of patient's response to treatment, examination of patient, obtaining history from patient or surrogate, ordering and performing treatments and interventions, ordering and review of laboratory studies, ordering and review of radiographic studies, pulse oximetry and re-evaluation of patient's condition.   Medications Ordered in the ED  divalproex  (DEPAKOTE ) DR tablet 750 mg (has no administration in time range)  heparin  ADULT infusion 100 units/mL (25000 units/250mL) (has no administration in time range)  sodium chloride  flush (NS) 0.9 % injection 3 mL (3 mLs Intravenous Given 02/18/24 1637)  iohexol  (OMNIPAQUE ) 350 MG/ML injection 75 mL (75 mLs Intravenous Contrast Given 02/18/24 1622)                                    Medical Decision Making Amount and/or Complexity of Data Reviewed Labs: ordered. Radiology: ordered.  Risk Prescription drug management. Decision regarding hospitalization.    Raymond Christensen is a 84 y.o. male with a past medical history significant  for CAD status post CABG, atrial fibrillation no longer on blood thinners due to recent intracranial hemorrhage and stroke, seizures, hypothyroidism, hypertension, sleep apnea, diabetes, and hyperlipidemia who presents for neurologic deficit.  According to EMS report, patient went to bed at 12:30 PM after lunch to take a nap and was doing well.  He woke up and was having significant aphasia.  He had no deficits before his nap and could not speak.  There was no reported unilateral numbness or weakness but he could not get anything 1 to say out.  EMS reports that during transport he had returned to baseline and was speaking completely normally.  He was articulate and answering questions appropriately.  He did urinate on himself and it was unclear if he just could not  hold his bladder or if he had incontinence although EMS did not report any recent seizure activity that they could see.  Upon getting into his exam room at 1558, EMS noticed return to his aphasia where he could not talk.  This lasted for several minutes or he was still having aphasia.  Patient began to answer some question but still was not completely back to his baseline.  Decision made to activate a code stroke due to the recurrent aphasia.  He was answering some questions but still did not seem clear like he was with EMS earlier.  EMS glucose was in the 120s.  On my initial exam, he had intact sensation and strength in extremities.  Normal finger-nose-finger testing symmetric smile.  Pupil symmetric and reactive with normal extraocular movements.  No evidence of acute trauma.  Will activate code stroke.  Chart review does show that he had recent suspected recrudescence of stroke due to electrolyte disturbances and hyponatremia.  There was also concern about seizures although EMS did not report seizure activity visible.  Anticipate reassessment after stroke workup.  7:21 PM Neurology just called and I spoke to Dr. Michaela who does  feel there is a new stroke and they recommend starting neurology stroke dosing heparin  and admission to medicine.  7:32 PM Patient and family all agree with this plan.  Will call for admission.  Family did report he has had urinary frequency so we will get a urinalysis as well.  Will call medicine for admission.     Final diagnoses:  Aphasia  Cerebrovascular accident (CVA), unspecified mechanism (HCC)     Clinical Impression: 1. Aphasia   2. Cerebrovascular accident (CVA), unspecified mechanism (HCC)     Disposition: Admit  This note was prepared with assistance of Dragon voice recognition software. Occasional wrong-word or sound-a-like substitutions may have occurred due to the inherent limitations of voice recognition software.      Shyan Scalisi, Lonni PARAS, MD 02/18/24 2355  "

## 2024-02-18 NOTE — ED Notes (Signed)
 Patient transported to MRI

## 2024-02-18 NOTE — H&P (Incomplete)
 " History and Physical    Raymond Christensen FMW:[id number] DOB: 1940/06/20 DOA: 02/18/2024  Patient coming from: Independent living facility.  Chief Complaint: Difficulty speaking.  HPI: Raymond Christensen is Christensen 84 y.o. male with history of CAD status post CABG and PCI, paroxysmal atrial fibrillation, sleep apnea, hypertension, diabetes mellitus type 2 on diet, mitral regurgitation status post mitral clip in 2021 recently admitted to the hospital on 12/26/2023 through 01/12/2024 for traumatic subdural hematoma and subarachnoid hemorrhage in the setting of Eliquis  which was discontinued and was made again on 02/01/2024 through 02/04/2024 for aphasia and right facial droop presents to the ER after patient had transient episode of difficulty speaking around noon time which resolved without any intervention.  Denies any weakness of upper or lower extremities.  Denies any visual symptoms.  While waiting in the ER patient had again had Christensen similar symptom which resolved without any intervention.  Patient states he also had frequent urination today.  ED Course: In the ER CT head shows left frontoparietal subdural hematoma 2 mm unchanged from prior with trace subarachnoid hemorrhage involving the left sylvian fissure within the occipital lobe decreased from prior.  MRI brain did not show anything acute.  Neurology was consulted.  Given the symptoms patient's presentation is concerning for TIA.  At this time neurologist felt that patient to be restarted on anticoagulation with heparin  in Christensen controlled environment to recheck CT head in the morning given the recent traumatic intracranial hemorrhage.  Patient had stated he had frequent urination but UA is unremarkable.  Review of Systems: As per HPI, rest all negative.   Past Medical History:  Diagnosis Date   Arthritis    CAD (coronary artery disease)    Cancer (HCC)    skin lt. arm.   Carotid artery disease    s/p left CEA 01/17/14   Chronic systolic CHF (congestive  heart failure) [facility])    Concussion Summer 2012   Essential hypertension 02/26/2017   Heart disease    Hyperlipidemia    Hypothyroidism 10/15/2017   Major depressive disorder    Mitral valve regurgitation    Myocardial infarction [facility]) 1996, 2021   Obstructive sleep apnea on CPAP    Persistent atrial fibrillation (HCC)    Poor flexibility of tendon 12/01/2018   S/P mitral valve clip implantation 12/23/2019   s/p TEER with MitraClip with one XTW by Dr. Wonda   Subungual hematoma of digit of hand 12/01/2018   Subungual hematoma of right foot 12/01/2018   Type 2 diabetes mellitus without complication, without long-term current use of insulin  (HCC) 02/26/2017    Past Surgical History:  Procedure Laterality Date   ATRIAL FIBRILLATION ABLATION N/Christensen 02/01/2020   Procedure: ATRIAL FIBRILLATION ABLATION;  Surgeon: Raymond Soyla Lunger, MD;  Location: [facility] INVASIVE CV LAB;  Service: Cardiovascular;  Laterality: N/Christensen;   BYPASS GRAFT  2001   CARDIAC CATHETERIZATION     CARDIOVERSION N/Christensen 11/08/2019   Procedure: CARDIOVERSION;  Surgeon: Raymond Maude BROCKS, MD;  Location: [facility] ENDOSCOPY;  Service: Cardiovascular;  Laterality: N/Christensen;   CAROTID ENDARTERECTOMY Left 01/17/2014   CORONARY ANGIOPLASTY     CORONARY ARTERY BYPASS GRAFT     LEFT HEART CATH AND CORS/GRAFTS ANGIOGRAPHY N/Christensen 06/26/2022   Procedure: LEFT HEART CATH AND CORS/GRAFTS ANGIOGRAPHY;  Surgeon: Raymond Debby LABOR, MD;  Location: [facility] INVASIVE CV LAB;  Service: Cardiovascular;  Laterality: N/Christensen;   RIGHT/LEFT HEART CATH AND CORONARY/GRAFT ANGIOGRAPHY N/Christensen 09/09/2019   Procedure: RIGHT/LEFT HEART CATH AND CORONARY/GRAFT ANGIOGRAPHY;  Surgeon: Raymond Carrier  J, MD;  Location: [facility] INVASIVE CV LAB;  Service: Cardiovascular;  Laterality: N/Christensen;   TEE WITHOUT CARDIOVERSION N/Christensen 09/13/2019   Procedure: TRANSESOPHAGEAL ECHOCARDIOGRAM (TEE);  Surgeon: Raymond Slain, MD;  Location: [facility] ENDOSCOPY;  Service: Cardiovascular;  Laterality: N/Christensen;   TEE WITHOUT  CARDIOVERSION N/Christensen 12/23/2019   Procedure: TRANSESOPHAGEAL ECHOCARDIOGRAM (TEE);  Surgeon: Raymond Sharper, MD;  Location: [facility] INVASIVE CV LAB;  Service: Cardiovascular;  Laterality: N/Christensen;   TEE WITHOUT CARDIOVERSION  02/01/2020   Procedure: TRANSESOPHAGEAL ECHOCARDIOGRAM (TEE);  Surgeon: Raymond Soyla Lunger, MD;  Location: [facility] INVASIVE CV LAB;  Service: Cardiovascular;;   TOTAL HIP ARTHROPLASTY  1994, 1995   TOTAL HIP ARTHROPLASTY Bilateral    TRANSCATHETER MITRAL EDGE TO EDGE REPAIR N/Christensen 12/23/2019   Procedure: MITRAL VALVE REPAIR;  Surgeon: Raymond Sharper, MD;  Location: [facility] INVASIVE CV LAB;  Service: Cardiovascular;  Laterality: N/Christensen;   TRANSURETHRAL RESECTION OF PROSTATE N/Christensen 12/27/2022   Procedure: TRANSURETHRAL RESECTION OF THE PROSTATE (TURP);  Surgeon: Raymond Cough, MD;  Location: [facility] ORS;  Service: Urology;  Laterality: N/Christensen;  75 minute case     reports that he quit smoking about 46 years ago. His smoking use included cigarettes. He has never used smokeless tobacco. He reports current alcohol use of about 1.0 standard drink of alcohol per week. He reports that he does not use drugs.  Allergies[1]  Family History  Problem Relation Age of Onset   Dementia Father        Concerns surrounding Lewy body dementia, but never officially diagnosed   Cancer Neg Hx     Prior to Admission medications  Medication Sig Start Date End Date Taking? Authorizing Provider  acetaminophen  (TYLENOL ) 500 MG tablet Take 1,000 mg by mouth daily.   Yes [provider]  atorvastatin  (LIPITOR ) 80 MG tablet TAKE 1 TABLET BY MOUTH DAILY 12/04/23  Yes Chandrasekhar, Mahesh A, MD  Cholecalciferol  (VITAMIN D3) 250 MCG (10000 UT) TABS Take 1 tablet by mouth daily.   Yes [provider]  ezetimibe  (ZETIA ) 10 MG tablet TAKE 1 TABLET BY MOUTH DAILY 12/04/23  Yes Chandrasekhar, Mahesh A, MD  famotidine  (PEPCID ) 20 MG tablet Take 20 mg by mouth daily.   Yes [provider]  finasteride   (PROSCAR ) 5 MG tablet Take 1 tablet (5 mg total) by mouth daily. 10/16/22  Yes Shona Layman BROCKS, MD  levothyroxine  (SYNTHROID ) 75 MCG tablet Take 1 tablet (75 mcg total) by mouth daily. 02/04/24  Yes Frann Mabel Mt, DO  liothyronine  (CYTOMEL ) 5 MCG tablet TAKE 2 TABLETS BY MOUTH DAILY 08/27/23  Yes Frann Mabel Mt, DO  LORazepam  (ATIVAN ) 0.5 MG tablet 1 bid 07/29/23  Yes Plovsky, Elna, MD  metoprolol  succinate (TOPROL -XL) 25 MG 24 hr tablet Take 0.5 tablets (12.5 mg total) by mouth daily. 02/04/24  Yes Frann Mabel Mt, DO  Multiple Vitamins-Minerals (MULTIVITAMIN ADULT EXTRA C PO) Take 1 tablet by mouth daily.    Yes [provider]  PARoxetine  (PAXIL ) 10 MG tablet TAKE 1 TABLET BY MOUTH DAILY 12/01/23  Yes Wendling, Mabel Mt, DO  polyethylene glycol powder (GLYCOLAX /MIRALAX ) 17 GM/SCOOP powder Dissolve 17 g as directed and take by mouth daily. Titrate as needed for Christensen soft bowel movement daily 01/12/24  Yes Perri DELENA Meliton Mickey., MD  sertraline  (ZOLOFT ) 50 MG tablet Take 1 tablet (50 mg total) by mouth daily. 01/13/24  Yes Plovsky, Elna, MD  valproic  acid (DEPAKENE ) 250 MG capsule Take 3 capsules (750 mg total) by mouth 2 (two) times daily. 02/16/24  Yes Frann Mabel Mt, DO  nitroGLYCERIN  (NITROSTAT ) 0.4 MG SL tablet Place 1 tablet (0.4 mg total) under the tongue every 5 (five) minutes as needed for chest pain. 06/21/22   Frann Mabel Mt, DO    Physical Exam: Constitutional: Moderately built and nourished. Vitals:   02/18/24 1600 02/18/24 1640 02/18/24 1745  BP: 118/69  (!) 177/72  Pulse: 79  (!) 55  Resp: 20  13  Temp: 97.9 F (36.6 C)    TempSrc: Oral    SpO2: 97%  100%  Weight:  98.6 kg   Height:  5' 9 (1.753 m)    Eyes: Anicteric no pallor. ENMT: No discharge from the ears eyes nose or mouth. Neck: No mass felt.  No neck rigidity. Respiratory: No rhonchi or crepitations. Cardiovascular: S1-S2 heard. Abdomen: Soft nontender bowel  sound present. Musculoskeletal: No edema. Skin: No rash. Neurologic: Alert awake oriented to time place and person.  Moving all extremities 5 x 5.  Right facial droop.  Pupils are equal reacting to light. Psychiatric: Appears normal.  Normal affect.   Labs on Admission: I have personally reviewed following labs and imaging studies  CBC: Recent Labs  Lab 02/18/24 1629 02/18/24 1634  WBC 8.0  --   NEUTROABS 4.8  --   HGB 11.7* 11.6*  HCT 35.8* 34.0*  MCV 88.4  --   PLT 144*  --    Basic Metabolic Panel: Recent Labs  Lab 02/18/24 1629 02/18/24 1634  NA 137 139  K 3.9 3.7  CL 104 103  CO2 22  --   GLUCOSE 84 83  BUN 11 10  CREATININE 0.74 0.80  CALCIUM  7.6*  --    GFR: Estimated Creatinine Clearance: 82.5 mL/min (by C-G formula based on SCr of 0.8 mg/dL). Liver Function Tests: Recent Labs  Lab 02/18/24 1629  AST 28  ALT 29  ALKPHOS 62  BILITOT 0.2  PROT 5.4*  ALBUMIN  3.5   No results for input(s): LIPASE, AMYLASE in the last 168 hours. No results for input(s): AMMONIA in the last 168 hours. Coagulation Profile: Recent Labs  Lab 02/18/24 1629  INR 1.1   Cardiac Enzymes: No results for input(s): CKTOTAL, CKMB, CKMBINDEX, TROPONINI in the last 168 hours. BNP (last 3 results) No results for input(s): PROBNP in the last 8760 hours. HbA1C: No results for input(s): HGBA1C in the last 72 hours. CBG: Recent Labs  Lab 02/18/24 1609  GLUCAP 101*   Lipid Profile: No results for input(s): CHOL, HDL, LDLCALC, TRIG, CHOLHDL, LDLDIRECT in the last 72 hours. Thyroid  Function Tests: No results for input(s): TSH, T4TOTAL, FREET4, T3FREE, THYROIDAB in the last 72 hours. Anemia Panel: No results for input(s): VITAMINB12, FOLATE, FERRITIN, TIBC, IRON, RETICCTPCT in the last 72 hours. Urine analysis:    Component Value Date/Time   COLORURINE STRAW (Christensen) 02/02/2024 1722   APPEARANCEUR CLEAR 02/02/2024 1722    APPEARANCEUR Clear 10/16/2022 0000   LABSPEC 1.015 02/02/2024 1722   PHURINE 8.0 02/02/2024 1722   GLUCOSEU NEGATIVE 02/02/2024 1722   HGBUR NEGATIVE 02/02/2024 1722   BILIRUBINUR NEGATIVE 02/02/2024 1722   BILIRUBINUR Negative 10/16/2022 0000   KETONESUR NEGATIVE 02/02/2024 1722   PROTEINUR NEGATIVE 02/02/2024 1722   NITRITE NEGATIVE 02/02/2024 1722   LEUKOCYTESUR NEGATIVE 02/02/2024 1722   Sepsis Labs: @LABRCNTIP (procalcitonin:4,lacticidven:4) )No results found for this or any previous visit (from the past 240 hours).   Radiological Exams on Admission: MR BRAIN WO CONTRAST Result Date: 02/18/2024 EXAM: MRI BRAIN WITHOUT CONTRAST 02/18/2024 06:27:10 PM TECHNIQUE:  Multiplanar multisequence MRI of the head/brain was performed without the administration of intravenous contrast. COMPARISON: 02/02/2024. CLINICAL HISTORY: Neuro deficit, acute, stroke suspected. Acute neurologic deficit; stroke suspected. FINDINGS: BRAIN AND VENTRICLES: No acute infarct. Trace residual subarachnoid blood within the left sylvian fissure. No new site of hemorrhage. Right visualized left hemispheric subdural hematoma measuring up to 4 mm in thickness, unchanged. Multifocal hyperintense T2-weighted signal within the cerebral white matter, most commonly due to chronic small vessel disease. Mild generalized volume loss. No mass. No midline shift. No hydrocephalus. The sella is unremarkable. Normal flow voids. ORBITS: No significant abnormality. SINUSES AND MASTOIDS: No significant abnormality. BONES AND SOFT TISSUES: Normal marrow signal. No soft tissue abnormality. IMPRESSION: 1. No acute infarct or other acute intracranial abnormality. 2. Trace residual subarachnoid blood within the left sylvian fissure, with no new site of hemorrhage. 3. Left hemispheric subdural hematoma measuring up to 4 mm in thickness, unchanged. 4. Multifocal T2 hyperintense signal within the cerebral white matter, most commonly due to chronic small  vessel disease. Electronically signed by: Franky Stanford MD 02/18/2024 07:46 PM EST RP Workstation: [id number]   CT ANGIO HEAD NECK W WO CM (CODE STROKE) Result Date: 02/18/2024 EXAM: CTA HEAD AND NECK WITH AND WITHOUT 02/18/2024 04:21:52 PM TECHNIQUE: CTA of the head and neck was performed with and without the administration of 75 mL of iohexol  (OMNIPAQUE ) 350 MG/ML injection. Multiplanar 2D and/or 3D reformatted images are provided for review. Automated exposure control, iterative reconstruction, and/or weight based adjustment of the mA/kV was utilized to reduce the radiation dose to as low as reasonably achievable. Stenosis of the internal carotid arteries measured using NASCET criteria. COMPARISON: CTA head and neck 12/26/2023 CLINICAL HISTORY: Neuro deficit, acute, stroke suspected. FINDINGS: CTA NECK: AORTIC ARCH AND ARCH VESSELS: Mild to moderate calcified and soft plaque in the aortic arch. No dissection or arterial injury. No significant stenosis of the brachiocephalic or subclavian arteries. CERVICAL CAROTID ARTERIES: Mixed calcified and soft plaque about both carotid bifurcations. Previous left carotid endarterectomy. No dissection or arterial injury. No hemodynamically significant stenosis by NASCET criteria. CERVICAL VERTEBRAL ARTERIES: The vertebral arteries are patent with the right being strongly dominant and the left being diffusely hypoplastic. Venous contrast limits assessment of the left V1 segment, however there is no evidence of Christensen significant stenosis or dissection elsewhere on either side. LUNGS AND MEDIASTINUM: Unremarkable. SOFT TISSUES: No acute abnormality. BONES: Moderate cervical spondylosis and advanced facet arthrosis. CTA HEAD: ANTERIOR CIRCULATION: The intracranial internal carotid arteries are patent with calcified plaque resulting in mild supraclinoid stenoses bilaterally, unchanged. The anterior cerebral arteries (ACAs) and middle cerebral arteries (MCAs) are patent without  evidence of Christensen proximal branch lesion or significant M1 or right A1 stenosis. Christensen moderate to severe left A1 stenosis is unchanged. No aneurysm. POSTERIOR CIRCULATION: The intracranial vertebral arteries are patent to the basilar. Patent anterior inferior cerebellar artery (AICA) and superior cerebellar artery (SCA) origins are visualized bilaterally. The basilar artery is patent and mildly irregular without Christensen significant stenosis. Posterior communicating arteries are diminutive or absent. The posterior cerebral arteries (PCAs) are patent with mild atherosclerotic irregularity bilaterally including Christensen mild proximal to mid left P2 stenosis. No aneurysm. OTHER: No dural venous sinus thrombosis on this non-dedicated study. The finding of no large vessel occlusion was communicated to Dr. EMERSON Seals at 4:36 PM on 02/18/2024 by secure text page via the [facility] messaging system. IMPRESSION: 1. No large vessel occlusion. 2. Unchanged intracranial atherosclerosis including Christensen moderate to severe left A1 stenosis  and mild bilateral supraclinoid ICA stenoses. 3. Cervical carotid atherosclerosis without significant stenosis. 4. Aortic Atherosclerosis (ICD10-I70.0). Electronically signed by: Dasie Hamburg MD 02/18/2024 04:48 PM EST RP Workstation: [id number]   CT HEAD CODE STROKE WO CONTRAST Result Date: 02/18/2024 EXAM: CT HEAD WITHOUT CONTRAST 02/18/2024 04:14:11 PM TECHNIQUE: CT of the head was performed without the administration of intravenous contrast. Automated exposure control, iterative reconstruction, and/or weight based adjustment of the mA/kV was utilized to reduce the radiation dose to as low as reasonably achievable. COMPARISON: 12/02/2024 and 12/01/2024. CLINICAL HISTORY: Neuro deficit, acute, stroke suspected. Acute neurologic deficit; stroke suspected. FINDINGS: BRAIN AND VENTRICLES: There is Christensen left frontoparietal subdural hematoma measuring up to 2 mm in thickness which is not significantly changed from the prior  studies. There is trace subarachnoid hemorrhage along the left sylvian fissure and within the left occipital lobe which is decreased from prior. There are no new or enlarging foci of intracranial hemorrhage. Similar generalized parenchymal volume loss. Similar appearance of extra-axial hypoattenuating fluid in the posterior fossa. Chronic microvascular ischemic changes. Atherosclerosis in the carotid siphons and intracranial right vertebral artery. No evidence of acute infarct. No hydrocephalus. No mass effect or midline shift. [place] Stroke Program Early CT (ASPECT) score: Ganglionic (caudate, IC, lentiform nucleus, insula, M1-M3): 7 Supraganglionic (M4-M6): 3 Total: 10 ORBITS: Left lens replacement. SINUSES: No acute abnormality. SOFT TISSUES AND SKULL: No acute soft tissue abnormality. No skull fracture. IMPRESSION: 1. No new areas of intracranial hemorrhage. 2. ASPECTS 10. 3. Left frontoparietal subdural hematoma measuring up to 2 mm in thickness, not significantly changed. 4. Trace subarachnoid hemorrhage along the left sylvian fissure and within the left occipital lobe, decreased from prior. 5. Findings messaged to Dr. Michaela via the [facility] messaging system at 4:27 PM on 02/18/24. Electronically signed by: Donnice Mania MD 02/18/2024 04:28 PM EST RP Workstation: [id number]    EKG: Independently reviewed.  Normal sinus rhythm.  Assessment/Plan Principal Problem:   TIA (transient ischemic attack)    TIA -      appreciate neurology consult.  Given the recent traumatic subdural hematoma and subarachnoid hemorrhage which as per the neurologist is almost resolved, neurologist wants to start patient on anticoagulation in Christensen controlled setting and recheck CT head.  Heparin  has been started.  Patient will be kept on neurochecks.  Since patient had recent 2D echo which will not be repeated.  Continue statins and Zetia .  Check EEG. Paroxysmal atrial fibrillation presently in sinus rhythm.  Anticoagulation  restarted with heparin  infusion closely monitoring for any neurological worsening given the recent traumatic subdural and subarachnoid hemorrhage see #1.  Continue metoprolol  for rate control. History of CAD denies any chest pain.  Continue statins metoprolol .  Presently on heparin . Hypothyroidism on Synthroid  and liothyronine . 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  Check EEG. BPH on finasteride  Depression on paroxetine  and sertraline . Sleep apnea on CPAP at bedtime. Diet controlled diabetes mellitus check hemoglobin A1c. History of mitral valve clip placement. Urinary frequency UA unremarkable.  DVT prophylaxis: Heparin  infusion. Code Status: Full code. Family Communication: Discussed with patient. Disposition Plan: Monitored bed. Consults called: Neurologist. Admission status: Observation.         [1]  Allergies Allergen Reactions   Sulfa Antibiotics Rash    Questionable, he developed Christensen diffuse rash 2 days after stopping Bactrim   "

## 2024-02-19 ENCOUNTER — Observation Stay (HOSPITAL_COMMUNITY)

## 2024-02-19 ENCOUNTER — Encounter (HOSPITAL_COMMUNITY): Payer: Self-pay | Admitting: Internal Medicine

## 2024-02-19 DIAGNOSIS — R4182 Altered mental status, unspecified: Secondary | ICD-10-CM | POA: Diagnosis not present

## 2024-02-19 DIAGNOSIS — R569 Unspecified convulsions: Secondary | ICD-10-CM

## 2024-02-19 LAB — LIPID PANEL
Cholesterol: 115 mg/dL (ref 0–200)
HDL: 49 mg/dL
LDL Cholesterol: 55 mg/dL (ref 0–99)
Total CHOL/HDL Ratio: 2.3 ratio
Triglycerides: 56 mg/dL
VLDL: 11 mg/dL (ref 0–40)

## 2024-02-19 LAB — COMPREHENSIVE METABOLIC PANEL WITH GFR
ALT: 33 U/L (ref 0–44)
AST: 25 U/L (ref 15–41)
Albumin: 4.1 g/dL (ref 3.5–5.0)
Alkaline Phosphatase: 74 U/L (ref 38–126)
Anion gap: 8 (ref 5–15)
BUN: 9 mg/dL (ref 8–23)
CO2: 30 mmol/L (ref 22–32)
Calcium: 9.1 mg/dL (ref 8.9–10.3)
Chloride: 102 mmol/L (ref 98–111)
Creatinine, Ser: 0.74 mg/dL (ref 0.61–1.24)
GFR, Estimated: >60 mL/min
Glucose, Bld: 82 mg/dL (ref 70–99)
Potassium: 4 mmol/L (ref 3.5–5.1)
Sodium: 140 mmol/L (ref 135–145)
Total Bilirubin: 0.4 mg/dL (ref 0.0–1.2)
Total Protein: 6.5 g/dL (ref 6.5–8.1)

## 2024-02-19 LAB — CBC
HCT: 42.5 % (ref 39.0–52.0)
Hemoglobin: 13.9 g/dL (ref 13.0–17.0)
MCH: 28.2 pg (ref 26.0–34.0)
MCHC: 32.7 g/dL (ref 30.0–36.0)
MCV: 86.2 fL (ref 80.0–100.0)
Platelets: 153 10*3/uL (ref 150–400)
RBC: 4.93 MIL/uL (ref 4.22–5.81)
RDW: 13.8 % (ref 11.5–15.5)
WBC: 8.4 10*3/uL (ref 4.0–10.5)
nRBC: 0 % (ref 0.0–0.2)

## 2024-02-19 LAB — OSMOLALITY: Osmolality: 298 mosm/kg — ABNORMAL HIGH (ref 275–295)

## 2024-02-19 LAB — VALPROIC ACID LEVEL: Valproic Acid Lvl: 114 ug/mL — ABNORMAL HIGH (ref 50–100)

## 2024-02-19 LAB — OSMOLALITY, URINE: Osmolality, Ur: 671 mosm/kg (ref 300–900)

## 2024-02-19 LAB — HEPARIN LEVEL (UNFRACTIONATED)
Heparin Unfractionated: 0.25 [IU]/mL — ABNORMAL LOW (ref 0.30–0.70)
Heparin Unfractionated: 0.26 [IU]/mL — ABNORMAL LOW (ref 0.30–0.70)
Heparin Unfractionated: 0.45 [IU]/mL (ref 0.30–0.70)

## 2024-02-19 LAB — SODIUM, URINE, RANDOM: Sodium, Ur: 57 mmol/L

## 2024-02-19 LAB — HEMOGLOBIN A1C
Hgb A1c MFr Bld: 5.5 % (ref 4.8–5.6)
Mean Plasma Glucose: 111.15 mg/dL

## 2024-02-19 LAB — CREATININE, URINE, RANDOM: Creatinine, Urine: 286 mg/dL

## 2024-02-19 NOTE — TOC Initial Note (Signed)
 Transition of Care [facility]) - Initial/Assessment Note    Patient Details  Name: Raymond Christensen MRN: [id number] Date of Birth: [birth year]  Transition of Care [facility]) CM/SW Contact:    Andrez JULIANNA George, RN Phone Number: 02/19/2024, 3:56 PM  Clinical Narrative:                  Raymond Christensen is a 84 y.o. male with history of CAD status post CABG and PCI, paroxysmal atrial fibrillation, sleep apnea, hypertension, diabetes mellitus type 2 on diet, mitral regurgitation status post mitral clip in 2021. Pt is from [facility] with his spouse. He has supervision at home.  Pt manages his own medications and has been driving. [facility] can assist with transportation if needed.   Therapy recommending HH PT. [facility] sees pts at [facility]. CM will send referral to [facility] with [facility].  ICM following.   Barriers to Discharge: Continued Medical Work up   Patient Goals and CMS Choice   CMS Medicare.gov Compare Post Acute Care list provided to:: Patient Choice offered to / list presented to : Patient      Expected Discharge Plan and Services   Discharge Planning Services: CM Consult Post Acute Care Choice: Home Health Living arrangements for the past 2 months: Independent Living Facility                           HH Arranged: PT HH Agency:  International Aid/development Worker at [employer])        Prior Living Arrangements/Services Living arrangements for the past 2 months: Independent Living Facility Lives with:: Spouse Patient language and need for interpreter reviewed:: Yes Do you feel safe going back to the place where you live?: Yes        Care giver support system in place?: Yes (comment) Current home services: DME (walker) Criminal Activity/Legal Involvement Pertinent to Current Situation/Hospitalization: No - Comment as needed  Activities of Daily Living   ADL Screening (condition at time of admission) Independently performs ADLs?: Yes (appropriate for developmental age) Is the patient deaf or  have difficulty hearing?: Yes Does the patient have difficulty seeing, even when wearing glasses/contacts?: No Does the patient have difficulty concentrating, remembering, or making decisions?: No  Permission Sought/Granted                  Emotional Assessment Appearance:: Appears stated age Attitude/Demeanor/Rapport: Engaged Affect (typically observed): Accepting Orientation: : Oriented to Self, Oriented to Place, Oriented to  Time, Oriented to Situation   Psych Involvement: No (comment)  Admission diagnosis:  Aphasia [R47.01] TIA (transient ischemic attack) [G45.9] GAD (generalized anxiety disorder) [F41.1] Cerebrovascular accident (CVA), unspecified mechanism (HCC) [I63.9] Patient Active Problem List   Diagnosis Date Noted   TIA (transient ischemic attack) 02/18/2024   Hyponatremia 02/02/2024   Urinary frequency 02/02/2024   Slurred speech 02/01/2024   Seizure disorder (HCC) 01/29/2024   History of subdural hemorrhage 01/29/2024   Fecal occult blood test positive 01/29/2024   Expressive aphasia 12/28/2023   Seizure (HCC) 12/28/2023   Subarachnoid hemorrhage (HCC) 12/26/2023   GAD (generalized anxiety disorder) 01/10/2023   Pain in left wrist 11/20/2022   S/P CABG (coronary artery bypass graft) 06/27/2022   Hypokalemia 06/27/2022   NSTEMI (non-ST elevated myocardial infarction) (HCC) 06/26/2022   PVC's (premature ventricular contractions) 05/31/2022   Lipoma of right upper extremity 03/02/2021   Trigger little finger of left hand 09/29/2020   Secondary hypercoagulable state 02/29/2020   Depression, major, single episode, moderate (  HCC) 02/28/2020   Nonrheumatic tricuspid valve regurgitation    S/P mitral valve clip implantation 12/23/2019   Mitral valve disease 12/23/2019   Nonrheumatic mitral valve regurgitation    Persistent atrial fibrillation (HCC)    Chronic HFrEF    Acute idiopathic gout involving toe of left foot    Thrombocytopenia 09/08/2019   CAD  (coronary artery disease)    Type 2 diabetes mellitus in patient with obesity (HCC) 06/22/2019   Major depressive disorder    Obstructive sleep apnea on CPAP    Hypothyroidism 10/15/2017   Subclavian artery stenosis, right 06/15/2017   Severe obesity (BMI 35.0-39.9) with comorbidity (HCC) 02/26/2017   Essential hypertension 02/26/2017   Memory change 01/10/2017   Urge incontinence of urine 01/10/2017   Lightheaded 06/25/2016   AK (actinic keratosis) 01/03/2016   History of seizures 03/06/2014   Occlusion and stenosis of left carotid artery 01/11/2014   Routine history and physical examination of adult 11/08/2013   History of repair of hip joint 04/20/2013   Hyperlipidemia 04/20/2013   Presence of artificial hip joint 04/20/2013   Vitamin D  deficiency 06/18/2010   Concussion 01/24/2010   Elevated prostate specific antigen (PSA) 01/01/2007   Benign prostatic hyperplasia without urinary obstruction 04/22/2006   Osteoarthrosis 04/22/2006   Dyslipidemia 04/02/2006   PCP:  Frann Mabel Mt, DO Pharmacy:   [facility] [place], [place] - [address] [place] [address] Phone: [phone] Fax: [phone]     Social Drivers of Health (SDOH) Social History: SDOH Screenings   Food Insecurity: No Food Insecurity (02/19/2024)  Housing: Low Risk (02/19/2024)  Transportation Needs: No Transportation Needs (02/19/2024)  Utilities: Not At Risk (02/19/2024)  Alcohol Screen: Low Risk (07/31/2022)  Depression (PHQ2-9): Low Risk (01/20/2024)  Financial Resource Strain: Low Risk (07/31/2022)  Physical Activity: Inactive (07/31/2022)  Social Connections: Socially Integrated (02/19/2024)  Stress: No Stress Concern Present (07/31/2022)  Tobacco Use: Medium Risk (02/19/2024)  Health Literacy: Adequate Health Literacy (07/31/2022)   SDOH Interventions:     Readmission Risk Interventions     No data to display

## 2024-02-19 NOTE — Progress Notes (Signed)
 OT Cancellation Note  Patient Details Name: Raymond Christensen MRN: [id number] DOB: [birth year]   Cancelled Treatment:    Reason Eval/Treat Not Completed: OT screened, no needs identified, will sign off (Per PT, pt does not have OT needs.)  [name] 02/19/2024, 12:04 PM

## 2024-02-19 NOTE — Care Management Obs Status (Signed)
 MEDICARE OBSERVATION STATUS NOTIFICATION   Patient Details  Name: Raymond Christensen MRN: [id number] Date of Birth: [birth year]   Medicare Observation Status Notification Given:  Yes    Andrez JULIANNA George, RN 02/19/2024, 3:45 PM

## 2024-02-19 NOTE — Progress Notes (Signed)
 ANTICOAGULATION CONSULT NOTE  Pharmacy Consult for Heparin  Indication: atrial fibrillation and stroke  Allergies[1]  Patient Measurements: Height: 5' 9 (175.3 cm) Weight: 98.6 kg (217 lb 6 oz) IBW/kg (Calculated) : 70.7 Heparin  Dosing Weight: 91.4 kg  Vital Signs: Temp: 97.8 F (36.6 C) (02/05 0129) Temp Source: Oral (02/05 0129) BP: 111/75 (02/05 0130) Pulse Rate: 60 (02/05 0130)  Labs: Recent Labs    02/18/24 1629 02/18/24 1634 02/19/24 0224  HGB 11.7* 11.6* 13.9  HCT 35.8* 34.0* 42.5  PLT 144*  --  153  APTT 37*  --   --   LABPROT 14.8  --   --   INR 1.1  --   --   HEPARINUNFRC  --   --  0.25*  CREATININE 0.74 0.80 0.74    Estimated Creatinine Clearance: 82.5 mL/min (by C-G formula based on SCr of 0.74 mg/dL).   Medical History: Past Medical History:  Diagnosis Date   Arthritis    CAD (coronary artery disease)    Cancer (HCC)    skin lt. arm.   Carotid artery disease    s/p left CEA 01/17/14   Chronic systolic CHF (congestive heart failure) (HCC)    Concussion Summer 2012   Essential hypertension 02/26/2017   Heart disease    Hyperlipidemia    Hypothyroidism 10/15/2017   Major depressive disorder    Mitral valve regurgitation    Myocardial infarction [facility]) 1996, 2021   Obstructive sleep apnea on CPAP    Persistent atrial fibrillation (HCC)    Poor flexibility of tendon 12/01/2018   S/P mitral valve clip implantation 12/23/2019   s/p TEER with MitraClip with one XTW by Dr. Wonda   Subungual hematoma of digit of hand 12/01/2018   Subungual hematoma of right foot 12/01/2018   Type 2 diabetes mellitus without complication, without long-term current use of insulin  (HCC) 02/26/2017    Assessment: 83 yom with a history of SAH, AF (previously on eliquis  which was stopped 2/2 to [facility]), HTN, HLD, CAD, CABG. Patient is presenting as stroke activation. Heparin  per pharmacy consult placed for atrial fibrillation and stroke. MRI pending. Neurology would like  re-start anticoagulation now but with conservative stroke protocol dosing and level monitoring goals.  Patient is not on anticoagulation prior to arrival.  2/5 AM update:  Heparin  level sub-therapeutic   Goal of Therapy:  Heparin  level 0.3-0.5 units/ml Monitor platelets by anticoagulation protocol: Yes   Plan:  Inc heparin  to 1200 units/hr Heparin  level in 8 hours  Lynwood Mckusick, PharmD, BCPS Clinical Pharmacist Phone: [phone]       [1]  Allergies Allergen Reactions   Sulfa Antibiotics Rash    Questionable, he developed a diffuse rash 2 days after stopping Bactrim

## 2024-02-19 NOTE — Plan of Care (Signed)
   Problem: Education: Goal: Knowledge of disease or condition will improve Outcome: Progressing

## 2024-02-19 NOTE — Procedures (Signed)
 Patient Name: Raymond Christensen  MRN: [id number]  Epilepsy Attending: Arlin MALVA Krebs  Referring Physician/Provider: Michaela Aisha SQUIBB, MD  Date: 02/19/2024 Duration: 23.11 mins  Patient history: 84yo M with ams. EEG to evaluate for seizure  Level of alertness: Awake, asleep  AEDs during EEG study: VPA  Technical aspects: This EEG study was done with scalp electrodes positioned according to the 10-20 International system of electrode placement. Electrical activity was reviewed with band pass filter of 1-70Hz , sensitivity of 7 uV/mm, display speed of 44mm/sec with a 60Hz  notched filter applied as appropriate. EEG data were recorded continuously and digitally stored.  Video monitoring was available and reviewed as appropriate.  Description: The posterior dominant rhythm consists of 8 Hz activity of moderate voltage (25-35 uV) seen predominantly in posterior head regions, symmetric and reactive to eye opening and eye closing. Sleep was characterized by vertex waves, sleep spindles (12 to 14 Hz), maximal frontocentral region. Hyperventilation and photic stimulation were not performed.     IMPRESSION: This study is within normal limits. No seizures or epileptiform discharges were seen throughout the recording.  A normal interictal EEG does not exclude the diagnosis of epilepsy.   Lawanda Holzheimer O Harriet Bollen

## 2024-02-19 NOTE — ED Notes (Signed)
Please update family

## 2024-02-19 NOTE — Progress Notes (Signed)
 ANTICOAGULATION CONSULT NOTE  Pharmacy Consult for Heparin  Indication: atrial fibrillation and stroke  Allergies[1]  Patient Measurements: Height: 5' 9 (175.3 cm) Weight: 98.6 kg (217 lb 6 oz) IBW/kg (Calculated) : 70.7 Heparin  Dosing Weight: 91.4 kg  Vital Signs: Temp: 97.7 F (36.5 C) (02/05 2023) Temp Source: Oral (02/05 2023) BP: 133/67 (02/05 2023) Pulse Rate: 57 (02/05 2023)  Labs: Recent Labs    02/18/24 1629 02/18/24 1634 02/19/24 0224 02/19/24 1226 02/19/24 2145  HGB 11.7* 11.6* 13.9  --   --   HCT 35.8* 34.0* 42.5  --   --   PLT 144*  --  153  --   --   APTT 37*  --   --   --   --   LABPROT 14.8  --   --   --   --   INR 1.1  --   --   --   --   HEPARINUNFRC  --   --  0.25* 0.26* 0.45  CREATININE 0.74 0.80 0.74  --   --     Estimated Creatinine Clearance: 82.5 mL/min (by C-G formula based on SCr of 0.74 mg/dL).   Medical History: Past Medical History:  Diagnosis Date   Arthritis    CAD (coronary artery disease)    Cancer (HCC)    skin lt. arm.   Carotid artery disease    s/p left CEA 01/17/14   Chronic systolic CHF (congestive heart failure) [facility])    Concussion Summer 2012   Essential hypertension 02/26/2017   Heart disease    Hyperlipidemia    Hypothyroidism 10/15/2017   Major depressive disorder    Mitral valve regurgitation    Myocardial infarction [facility]) 1996, 2021   Obstructive sleep apnea on CPAP    Persistent atrial fibrillation (HCC)    Poor flexibility of tendon 12/01/2018   S/P mitral valve clip implantation 12/23/2019   s/p TEER with MitraClip with one XTW by Dr. Wonda   Subungual hematoma of digit of hand 12/01/2018   Subungual hematoma of right foot 12/01/2018   Type 2 diabetes mellitus without complication, without long-term current use of insulin  (HCC) 02/26/2017    Assessment: 83 yom with a history of SAH, AF (previously on eliquis  which was stopped 2/2 to [facility]), HTN, HLD, CAD, CABG. Patient is presenting as stroke  activation. Heparin  per pharmacy consult placed for atrial fibrillation and stroke. MRI pending. Neurology would like re-start anticoagulation now but with conservative stroke protocol dosing and level monitoring goals.  Patient is not on anticoagulation prior to arrival.  2/5 pm: Heparin  level therapeutic. No infusion issues noted.   Goal of Therapy:  Heparin  level 0.3-0.5 units/ml Monitor platelets by anticoagulation protocol: Yes   Plan:  Continue heparin  at 1300 units/hr Daily heparin  level and CBC  Larraine Brazier, PharmD Clinical Pharmacist 02/19/2024  10:51 PM **Pharmacist phone directory can now be found on [email or web address] ([email or web address]).  Listed under [facility].           [1]  Allergies Allergen Reactions   Sulfa Antibiotics Rash    Questionable, he developed a diffuse rash 2 days after stopping Bactrim

## 2024-02-19 NOTE — Progress Notes (Signed)
 ANTICOAGULATION CONSULT NOTE  Pharmacy Consult for Heparin  Indication: atrial fibrillation and stroke  Allergies[1]  Patient Measurements: Height: 5' 9 (175.3 cm) Weight: 98.6 kg (217 lb 6 oz) IBW/kg (Calculated) : 70.7 Heparin  Dosing Weight: 91.4 kg  Vital Signs: Temp: 97.7 F (36.5 C) (02/05 0922) Temp Source: Oral (02/05 0922) BP: 136/76 (02/05 1245) Pulse Rate: 56 (02/05 1245)  Labs: Recent Labs    02/18/24 1629 02/18/24 1634 02/19/24 0224 02/19/24 1226  HGB 11.7* 11.6* 13.9  --   HCT 35.8* 34.0* 42.5  --   PLT 144*  --  153  --   APTT 37*  --   --   --   LABPROT 14.8  --   --   --   INR 1.1  --   --   --   HEPARINUNFRC  --   --  0.25* 0.26*  CREATININE 0.74 0.80 0.74  --     Estimated Creatinine Clearance: 82.5 mL/min (by C-G formula based on SCr of 0.74 mg/dL).   Medical History: Past Medical History:  Diagnosis Date   Arthritis    CAD (coronary artery disease)    Cancer (HCC)    skin lt. arm.   Carotid artery disease    s/p left CEA 01/17/14   Chronic systolic CHF (congestive heart failure) (HCC)    Concussion Summer 2012   Essential hypertension 02/26/2017   Heart disease    Hyperlipidemia    Hypothyroidism 10/15/2017   Major depressive disorder    Mitral valve regurgitation    Myocardial infarction [facility]) 1996, 2021   Obstructive sleep apnea on CPAP    Persistent atrial fibrillation (HCC)    Poor flexibility of tendon 12/01/2018   S/P mitral valve clip implantation 12/23/2019   s/p TEER with MitraClip with one XTW by Dr. Wonda   Subungual hematoma of digit of hand 12/01/2018   Subungual hematoma of right foot 12/01/2018   Type 2 diabetes mellitus without complication, without long-term current use of insulin  (HCC) 02/26/2017    Assessment: 83 yom with a history of SAH, AF (previously on eliquis  which was stopped 2/2 to [facility]), HTN, HLD, CAD, CABG. Patient is presenting as stroke activation. Heparin  per pharmacy consult placed for atrial  fibrillation and stroke. MRI pending. Neurology would like re-start anticoagulation now but with conservative stroke protocol dosing and level monitoring goals.  Patient is not on anticoagulation prior to arrival.  CBC stable. Heparin  level remains subtherapeutic despite dose increase. Patient transferred from ED to 3W.   Goal of Therapy:  Heparin  level 0.3-0.5 units/ml Monitor platelets by anticoagulation protocol: Yes   Plan:  Inc heparin  to 1300 units/hr Heparin  level in 8 hours  Rankin Sams, PharmD, BCCCP Clinical Pharmacist        [1]  Allergies Allergen Reactions   Sulfa Antibiotics Rash    Questionable, he developed a diffuse rash 2 days after stopping Bactrim

## 2024-02-19 NOTE — Progress Notes (Signed)
 SLP Cancellation Note  Patient Details Name: Yer Olivencia MRN: [id number] DOB: 1940/03/31   Cancelled treatment:       Reason Eval/Treat Not Completed: Imaging negative for acute CVA. Staff and MD report no further impairment with communication. SLP screened, no needs identified, will sign off   Trueman Worlds, Consuelo Fitch 02/19/2024, 3:41 PM

## 2024-02-19 NOTE — Progress Notes (Signed)
 " Progress Note   Patient: Raymond Christensen FMW:[id number] DOB: 11-03-1940 DOA: 02/18/2024     0 DOS: the patient was seen and examined on 02/19/2024    Brief hospital course: Raymond Christensen is a 84 y.o. male with history of CAD status post CABG and PCI, paroxysmal atrial fibrillation, sleep apnea, hypertension, diabetes mellitus type 2 on diet, mitral regurgitation status post mitral clip in 2021 recently admitted to the hospital on 12/26/2023 through 01/12/2024 for traumatic subdural hematoma and subarachnoid hemorrhage in the setting of Eliquis  which was discontinued and was admitted again on 02/01/2024 through 02/04/2024 for aphasia and right facial droop, who presented to the ER after patient had transient episode of difficulty speaking around noon time which resolved without any intervention.  Denied any weakness of upper or lower extremities.  Denies any visual symptoms.  While waiting in the ER patient had again had a similar symptom which resolved without any intervention.  Patient also complained of frequent urination.   ED Course: In the ER CT head shows left frontoparietal subdural hematoma 2 mm unchanged from prior with trace subarachnoid hemorrhage involving the left sylvian fissure within the occipital lobe decreased from prior.  MRI brain did not show anything acute.  Neurology was consulted.  Given the symptoms patient's presentation is concerning for TIA.  At this time neurologist felt that patient to be restarted on anticoagulation with heparin  in a controlled environment to recheck CT head in the morning given the recent traumatic intracranial hemorrhage.  Patient had stated he had frequent urination but UA is unremarkable.    Assessment and Plan: No notes have been filed under this hospital service. Service: Hospitalist  TIA -      appreciate neurology consult.  Given the recent traumatic subdural hematoma and subarachnoid hemorrhage which as per the neurologist is almost resolved,  neurologist wants to start patient on anticoagulation in a controlled setting and recheck CT head.  Heparin  has been started.  Patient will be kept on neurochecks.  Since patient had recent 2D echo which will not be repeated.  Continue statins and Zetia .  Check EEG.  Paroxysmal atrial fibrillation presently in sinus rhythm.  Anticoagulation restarted with heparin  infusion closely monitoring for any neurological worsening given the recent traumatic subdural and subarachnoid hemorrhage. Continuing metoprolol  for rate control.  Frequent urination, predominantly nocturia UA unremarkable, labs unremarkable. Fluid intake seems within normal limits.   A1c 5.5 - Check urine sodium, creatinine, osmolality. - Check serum osmolality. - Will need further evaluation of this symptom  History of CAD denies any chest pain.   Continue statins metoprolol .  Presently on heparin .  Hypothyroidism  on Synthroid  and liothyronine .  History of traumatic subdural and subarachnoid hemorrhage with seizures  on valproic  acid.   Check EEG. -Monitor Depakote  levels. - Neurology following.  BPH on finasteride .  Patient with history of TURP in 2024 -Continue home medications  Depression on paroxetine  and sertraline .  Sleep apnea on CPAP at bedtime.  History of mitral valve clip placement. Outpatient follow-up        Subjective: Patient is feeling better.  He has had a few episodes of speech difficulty and each time they resolve. Patient and family complains that the patient has polyuria.  Passing urine frequently, especially at night.  Volumes passed per occasion seem normal.  Physical Exam: BP 133/78 (BP Location: Right Wrist)   Pulse 63   Temp 97.8 F (36.6 C) (Oral)   Resp 18   Ht 5' 9 (1.753 m)  Wt 98.6 kg   SpO2 98%   BMI 32.10 kg/m    General: Alert, oriented X3  Eyes: Pupils equal, reactive  Oral cavity: moist mucous membranes  Head: Atraumatic, normocephalic  Neck: supple  Chest:  clear to auscultation. No crackles, no wheezes  CVS: S1,S2 RRR. No murmurs  Abd: No distention, soft, non-tender. No masses palpable  Extr: No edema   MSK: No joint deformities or swelling  Neurological: Grossly intact.    Data Reviewed:    Latest Ref Rng & Units 02/19/2024    2:24 AM 02/18/2024    4:34 PM 02/18/2024    4:29 PM  CBC  WBC 4.0 - 10.5 K/uL 8.4   8.0   Hemoglobin 13.0 - 17.0 g/dL 86.0  88.3  88.2   Hematocrit 39.0 - 52.0 % 42.5  34.0  35.8   Platelets 150 - 400 K/uL 153   144       Latest Ref Rng & Units 02/19/2024    2:24 AM 02/18/2024    4:34 PM 02/18/2024    4:29 PM  BMP  Glucose 70 - 99 mg/dL 82  83  84   BUN 8 - 23 mg/dL 9  10  11    Creatinine 0.61 - 1.24 mg/dL 9.25  9.19  9.25   Sodium 135 - 145 mmol/L 140  139  137   Potassium 3.5 - 5.1 mmol/L 4.0  3.7  3.9   Chloride 98 - 111 mmol/L 102  103  104   CO2 22 - 32 mmol/L 30   22   Calcium  8.9 - 10.3 mg/dL 9.1   7.6      Family Communication: Spoke with wife at bedside and son on phone  Disposition: Status is: Observation DVT PPx: Systemic anticoagulation with heparin  GTT      Author: MDALA-GAUSI, Elanor Cale AGATHA, MD 02/19/2024 3:04 PM  For on call review [email or web address].    "

## 2024-02-19 NOTE — ED Notes (Signed)
 Patient transported to CT

## 2024-02-19 NOTE — Progress Notes (Addendum)
 STROKE TEAM PROGRESS NOTE    INTERIM HISTORY/SUBJECTIVE  Patient remains hemodynamically stable and afebrile.  His MRI and EEG were negative, and the only remaining symptom is a subtle right facial droop  OBJECTIVE  CBC    Component Value Date/Time   WBC 8.4 02/19/2024 0224   RBC 4.93 02/19/2024 0224   HGB 13.9 02/19/2024 0224   HGB 13.4 02/20/2021 1145   HCT 42.5 02/19/2024 0224   HCT 39.4 02/20/2021 1145   PLT 153 02/19/2024 0224   PLT 163 02/20/2021 1145   MCV 86.2 02/19/2024 0224   MCV 85 02/20/2021 1145   MCH 28.2 02/19/2024 0224   MCHC 32.7 02/19/2024 0224   RDW 13.8 02/19/2024 0224   RDW 13.1 02/20/2021 1145   LYMPHSABS 2.1 02/18/2024 1629   LYMPHSABS 2.0 12/30/2019 1401   MONOABS 0.9 02/18/2024 1629   EOSABS 0.1 02/18/2024 1629   EOSABS 0.1 12/30/2019 1401   BASOSABS 0.0 02/18/2024 1629   BASOSABS 0.0 12/30/2019 1401    BMET    Component Value Date/Time   NA 140 02/19/2024 0224   NA 142 02/20/2021 1145   K 4.0 02/19/2024 0224   CL 102 02/19/2024 0224   CO2 30 02/19/2024 0224   GLUCOSE 82 02/19/2024 0224   BUN 9 02/19/2024 0224   BUN 20 02/20/2021 1145   CREATININE 0.74 02/19/2024 0224   CREATININE 1.17 05/03/2022 1556   CALCIUM  9.1 02/19/2024 0224   EGFR 70 02/20/2021 1145   GFRNONAA >60 02/19/2024 0224    IMAGING past 24 hours EEG adult Result Date: 02/19/2024 Shelton Arlin KIDD, MD     02/19/2024 10:25 AM Patient Name: Raymond Christensen MRN: [id number] Epilepsy Attending: Arlin KIDD Shelton Referring Physician/Provider: Michaela Aisha SQUIBB, MD Date: 02/19/2024 Duration: 23.11 mins Patient history: 84yo M with ams. EEG to evaluate for seizure Level of alertness: Awake, asleep AEDs during EEG study: VPA Technical aspects: This EEG study was done with scalp electrodes positioned according to the 10-20 International system of electrode placement. Electrical activity was reviewed with band pass filter of 1-70Hz , sensitivity of 7 uV/mm, display speed of 21mm/sec with  a 60Hz  notched filter applied as appropriate. EEG data were recorded continuously and digitally stored.  Video monitoring was available and reviewed as appropriate. Description: The posterior dominant rhythm consists of 8 Hz activity of moderate voltage (25-35 uV) seen predominantly in posterior head regions, symmetric and reactive to eye opening and eye closing. Sleep was characterized by vertex waves, sleep spindles (12 to 14 Hz), maximal frontocentral region. Hyperventilation and photic stimulation were not performed.   IMPRESSION: This study is within normal limits. No seizures or epileptiform discharges were seen throughout the recording. A normal interictal EEG does not exclude the diagnosis of epilepsy. Priyanka KIDD Shelton   CT HEAD WO CONTRAST ( ) Result Date: 02/19/2024 EXAM: CT HEAD WITHOUT CONTRAST 02/19/2024 05:58:41 AM TECHNIQUE: CT of the head was performed without the administration of intravenous contrast. Automated exposure control, iterative reconstruction, and/or weight based adjustment of the mA/kV was utilized to reduce the radiation dose to as low as reasonably achievable. COMPARISON: Brain MRI and CT head 02/18/2024. CLINICAL HISTORY: 84 year old male. Stroke presentation yesterday, status post intracranial hemorrhage in December with residual small volume subdural and subarachnoid hemorrhage. FINDINGS: BRAIN AND VENTRICLES: Stable small volume left side subdural hematoma, 2 to 3 millimeters (coronal image 41). Trace right side subdural hematoma on MRI is occult by CT. Other extra-axial CSF redemonstrated including in the posterior fossa. Trace residual subarachnoid blood at  the left temporal lobe is stable. No new or increased intracranial hemorrhage. No intracranial mass effect or midline shift. No ventriculomegaly. Stable gray-white differentiation. No acute or evolving infarct identified. No suspicious intracranial vascular hyperdensity. Calcified atherosclerosis at the skull base.  ORBITS: No gaze deviation. SINUSES: Paranasal sinuses, middle ears, and mastoids remain well aerated. SOFT TISSUES AND SKULL: No acute soft tissue abnormality. No skull fracture. IMPRESSION: 1. Stable small volume subdural and subarachnoid blood with no intracranial mass effect. 2. No new intracranial abnormality. Electronically signed by: Helayne Hurst MD 02/19/2024 06:15 AM EST RP Workstation: [id number]   MR BRAIN WO CONTRAST Result Date: 02/18/2024 EXAM: MRI BRAIN WITHOUT CONTRAST 02/18/2024 06:27:10 PM TECHNIQUE: Multiplanar multisequence MRI of the head/brain was performed without the administration of intravenous contrast. COMPARISON: 02/02/2024. CLINICAL HISTORY: Neuro deficit, acute, stroke suspected. Acute neurologic deficit; stroke suspected. FINDINGS: BRAIN AND VENTRICLES: No acute infarct. Trace residual subarachnoid blood within the left sylvian fissure. No new site of hemorrhage. Right visualized left hemispheric subdural hematoma measuring up to 4 mm in thickness, unchanged. Multifocal hyperintense T2-weighted signal within the cerebral white matter, most commonly due to chronic small vessel disease. Mild generalized volume loss. No mass. No midline shift. No hydrocephalus. The sella is unremarkable. Normal flow voids. ORBITS: No significant abnormality. SINUSES AND MASTOIDS: No significant abnormality. BONES AND SOFT TISSUES: Normal marrow signal. No soft tissue abnormality. IMPRESSION: 1. No acute infarct or other acute intracranial abnormality. 2. Trace residual subarachnoid blood within the left sylvian fissure, with no new site of hemorrhage. 3. Left hemispheric subdural hematoma measuring up to 4 mm in thickness, unchanged. 4. Multifocal T2 hyperintense signal within the cerebral white matter, most commonly due to chronic small vessel disease. Electronically signed by: Franky Stanford MD 02/18/2024 07:46 PM EST RP Workstation: [id number]   CT ANGIO HEAD NECK W WO CM (CODE STROKE) Result Date:  02/18/2024 EXAM: CTA HEAD AND NECK WITH AND WITHOUT 02/18/2024 04:21:52 PM TECHNIQUE: CTA of the head and neck was performed with and without the administration of 75 mL of iohexol  (OMNIPAQUE ) 350 MG/ML injection. Multiplanar 2D and/or 3D reformatted images are provided for review. Automated exposure control, iterative reconstruction, and/or weight based adjustment of the mA/kV was utilized to reduce the radiation dose to as low as reasonably achievable. Stenosis of the internal carotid arteries measured using NASCET criteria. COMPARISON: CTA head and neck 12/26/2023 CLINICAL HISTORY: Neuro deficit, acute, stroke suspected. FINDINGS: CTA NECK: AORTIC ARCH AND ARCH VESSELS: Mild to moderate calcified and soft plaque in the aortic arch. No dissection or arterial injury. No significant stenosis of the brachiocephalic or subclavian arteries. CERVICAL CAROTID ARTERIES: Mixed calcified and soft plaque about both carotid bifurcations. Previous left carotid endarterectomy. No dissection or arterial injury. No hemodynamically significant stenosis by NASCET criteria. CERVICAL VERTEBRAL ARTERIES: The vertebral arteries are patent with the right being strongly dominant and the left being diffusely hypoplastic. Venous contrast limits assessment of the left V1 segment, however there is no evidence of a significant stenosis or dissection elsewhere on either side. LUNGS AND MEDIASTINUM: Unremarkable. SOFT TISSUES: No acute abnormality. BONES: Moderate cervical spondylosis and advanced facet arthrosis. CTA HEAD: ANTERIOR CIRCULATION: The intracranial internal carotid arteries are patent with calcified plaque resulting in mild supraclinoid stenoses bilaterally, unchanged. The anterior cerebral arteries (ACAs) and middle cerebral arteries (MCAs) are patent without evidence of a proximal branch lesion or significant M1 or right A1 stenosis. A moderate to severe left A1 stenosis is unchanged. No aneurysm. POSTERIOR CIRCULATION: The  intracranial vertebral  arteries are patent to the basilar. Patent anterior inferior cerebellar artery (AICA) and superior cerebellar artery (SCA) origins are visualized bilaterally. The basilar artery is patent and mildly irregular without a significant stenosis. Posterior communicating arteries are diminutive or absent. The posterior cerebral arteries (PCAs) are patent with mild atherosclerotic irregularity bilaterally including a mild proximal to mid left P2 stenosis. No aneurysm. OTHER: No dural venous sinus thrombosis on this non-dedicated study. The finding of no large vessel occlusion was communicated to Dr. EMERSON Seals at 4:36 PM on 02/18/2024 by secure text page via the [facility] messaging system. IMPRESSION: 1. No large vessel occlusion. 2. Unchanged intracranial atherosclerosis including a moderate to severe left A1 stenosis and mild bilateral supraclinoid ICA stenoses. 3. Cervical carotid atherosclerosis without significant stenosis. 4. Aortic Atherosclerosis (ICD10-I70.0). Electronically signed by: Dasie Hamburg MD 02/18/2024 04:48 PM EST RP Workstation: [id number]   CT HEAD CODE STROKE WO CONTRAST Result Date: 02/18/2024 EXAM: CT HEAD WITHOUT CONTRAST 02/18/2024 04:14:11 PM TECHNIQUE: CT of the head was performed without the administration of intravenous contrast. Automated exposure control, iterative reconstruction, and/or weight based adjustment of the mA/kV was utilized to reduce the radiation dose to as low as reasonably achievable. COMPARISON: 12/02/2024 and 12/01/2024. CLINICAL HISTORY: Neuro deficit, acute, stroke suspected. Acute neurologic deficit; stroke suspected. FINDINGS: BRAIN AND VENTRICLES: There is a left frontoparietal subdural hematoma measuring up to 2 mm in thickness which is not significantly changed from the prior studies. There is trace subarachnoid hemorrhage along the left sylvian fissure and within the left occipital lobe which is decreased from prior. There are no new or  enlarging foci of intracranial hemorrhage. Similar generalized parenchymal volume loss. Similar appearance of extra-axial hypoattenuating fluid in the posterior fossa. Chronic microvascular ischemic changes. Atherosclerosis in the carotid siphons and intracranial right vertebral artery. No evidence of acute infarct. No hydrocephalus. No mass effect or midline shift. [place] Stroke Program Early CT (ASPECT) score: Ganglionic (caudate, IC, lentiform nucleus, insula, M1-M3): 7 Supraganglionic (M4-M6): 3 Total: 10 ORBITS: Left lens replacement. SINUSES: No acute abnormality. SOFT TISSUES AND SKULL: No acute soft tissue abnormality. No skull fracture. IMPRESSION: 1. No new areas of intracranial hemorrhage. 2. ASPECTS 10. 3. Left frontoparietal subdural hematoma measuring up to 2 mm in thickness, not significantly changed. 4. Trace subarachnoid hemorrhage along the left sylvian fissure and within the left occipital lobe, decreased from prior. 5. Findings messaged to Dr. Seals via the [facility] messaging system at 4:27 PM on 02/18/24. Electronically signed by: Donnice Mania MD 02/18/2024 04:28 PM EST RP Workstation: [id number]    Vitals:   02/19/24 0930 02/19/24 1000 02/19/24 1010 02/19/24 1245  BP: (!) 154/75 (!) 163/61 (!) 163/61 136/76  Pulse: (!) 53 (!) 52 60 (!) 56  Resp: 12 15  14   Temp:      TempSrc:      SpO2: 99% 99%  99%  Weight:      Height:         PHYSICAL EXAM General:  Alert, well-nourished, well-developed patient in no acute distress Psych:  Mood and affect appropriate for situation CV: Regular rate and rhythm on monitor Respiratory:  Regular, unlabored respirations on room air   NEURO:  Mental Status: AA&Ox3, patient is able to give clear and coherent history Speech/Language: speech is without dysarthria or aphasia.  Naming, repetition, fluency, and comprehension intact.  Cranial Nerves:  II: PERRL. Visual fields full.  III, IV, VI: EOMI. Eyelids elevate symmetrically.  V:  Sensation is intact to light touch and symmetrical  to face.  VII: Subtle right facial droop VIII: hearing intact to voice. IX, X: Phonation is normal.  KP:Dynloizm shrug 5/5. XII: tongue is midline without fasciculations. Motor: 5/5 strength to all muscle groups tested.  Tone: is normal and bulk is normal Sensation- Intact to light touch bilaterally.  Coordination: FTN intact bilaterally Gait- deferred  Most Recent NIH  1a Level of Conscious.: 0 1b LOC Questions: 0 1c LOC Commands: 0 2 Best Gaze: 0 3 Visual: 0 4 Facial Palsy: 1 5a Motor Arm - left: 0 5b Motor Arm - Right: 0 6a Motor Leg - Left: 0 6b Motor Leg - Right: 0 7 Limb Ataxia: 0 8 Sensory: 0 9 Best Language: 0 10 Dysarthria: 0 11 Extinct. and Inatten.: 0 TOTAL: 1   ASSESSMENT/PLAN  [name] is a 84 y.o. male with history of traumatic subarachnoid hemorrhage in 12/25, A-fib with Eliquis  stopped post subarachnoid hemorrhage, seizures, hypertension, hyperlipidemia, MI and CAD status post CABG admitted for a transient episode of aphasia and dysarthria.  Patient reports that yesterday, he took a nap and when he woke up, he had slurred speech and difficulty finding his words.  No seizure activity was noted.  MRI was negative for acute stroke, and EEG was negative for seizure activity.  Suspect that patient had a TIA.  NIH on Admission 0  Left brain TIA versus seizure episodes Code Stroke CT head stable small volume subdural and subarachnoid blood CTA head & neck no LVO, chronic moderate to severe left A1 stenosis and mild bilateral supraclinoid ICA stenoses MRI no acute infarct, trace residual subarachnoid blood within the left sylvian fissure, left hemispheric subdural hematoma, unchanged, chronic small vessel ischemic disease 2D Echo EF 55 to 60%, no atrial level shunt LDL 55 HgbA1c 5.5 Depakote  level 113->pending VTE prophylaxis -fully anticoagulated with heparin  No antithrombotic prior to admission, now on  heparin  IV with plans to transition to Eliquis  On home depakote  750 bid EEG normal limit Therapy recommendations:  Home Health PT Disposition: Pending, likely home  History of seizures with traumatic [facility] 12/26/23 admitted for left temporal lobe traumatic SAH after syncope with fall 6 days prior.  He presented with aphasia and dysarthria.  Seen by neurosurgery, no surgical intervention needed.  Repeat CT stable.  12/27/2023 EEG showed 1 episode of subclinical seizure, lasting 35 seconds.  Put on Keppra .  Eliquis  discontinued at that time. Developed some facial twitching briefly, increased keppra  to 1g bid and LTM EEG no more seizure.  02/01/2024 admitted again for episodes of slurred speech and right facial droop.  MRI negative for acute stroke.  CTV negative for sinus thrombosis.  Wife reported agitation, drowsiness and bad tamper, Keppra  discontinued and put on Depakote  500 twice daily.  EEG no seizure. Follow-up with PCP, Depakote  level 43 on 02/13/24, increase Depakote  to 750 twice daily. Has appointment with Dr. Ole at [facility] on 03/16/2024  Atrial fibrillation Home Meds: Metoprolol  XL 12.5 mg daily Continue telemetry monitoring CT repeat x 2 resolving previous SAH/SDH Begin anticoagulation with IV heparin  with plans to transition to Eliquis   Hypertension Home meds: Metoprolol  12.5 daily Stable now On home metoprolol  Maintain normotension  Hyperlipidemia Home meds: Atorvastatin  80 mg daily and ezetimibe  10 mg daily, resumed in hospital LDL 55, goal < 70 Continue statin and Zetia  at discharge  Dysphagia Patient has post-stroke dysphagia, SLP consulted On dysphagia 3 and thin liquid Advance diet as tolerated  Other Stroke Risk Factors Obesity, Body mass index is 32.1 kg/m., BMI >/=  30 associated with increased stroke risk, recommend weight loss, diet and exercise as appropriate  Coronary artery disease and MI status post CABG  Other Active Problems Depression on Zoloft  and the  Paxil  BPH on Proscar   Hospital day # 0  Patient seen by NP with MD, MD to edit note as needed. Cortney E Everitt Clint Kill , MSN, AGACNP-BC [facility] See Amion for schedule and pager information 02/19/2024 1:12 PM   ATTENDING NOTE: I reviewed above note and agree with the assessment and plan. Pt was seen and examined.   Wife at bedside.  Patient reclined in bed, had breakfast, doing well, stated his speech at his baseline.  Neuroexam intact.  CT overnight showed stable SAH/SDH is resolving.  EEG no seizure.  Depakote  level 113 this morning, on home Depakote  750 twice daily.  Monitoring Depakote  level.  On heparin  IV, plan to transition to Eliquis  tomorrow or Friday.  Patient symptoms concerning for seizure, cannot rule out TIA.  PT and OT recommend home health.  Will follow.  For detailed assessment and plan, please refer to above as I have made changes wherever appropriate.   Ary Cummins, MD PhD Stroke Neurology 02/19/2024 7:10 PM    To contact Stroke Continuity provider, please refer to [email or web address]. After hours, contact General Neurology

## 2024-02-19 NOTE — Plan of Care (Signed)
   Problem: Education: Goal: Knowledge of disease or condition will improve Outcome: Progressing   Problem: Coping: Goal: Will verbalize positive feelings about self Outcome: Progressing

## 2024-02-19 NOTE — Progress Notes (Signed)
 Routine EEG complete. Results pending.

## 2024-02-20 ENCOUNTER — Ambulatory Visit: Admitting: Clinical

## 2024-02-20 LAB — BASIC METABOLIC PANEL WITH GFR
Anion gap: 9 (ref 5–15)
BUN: 18 mg/dL (ref 8–23)
CO2: 27 mmol/L (ref 22–32)
Calcium: 8.8 mg/dL — ABNORMAL LOW (ref 8.9–10.3)
Chloride: 102 mmol/L (ref 98–111)
Creatinine, Ser: 0.85 mg/dL (ref 0.61–1.24)
GFR, Estimated: >60 mL/min
Glucose, Bld: 75 mg/dL (ref 70–99)
Potassium: 4.5 mmol/L (ref 3.5–5.1)
Sodium: 138 mmol/L (ref 135–145)

## 2024-02-20 LAB — CBC
HCT: 40.7 % (ref 39.0–52.0)
Hemoglobin: 13.6 g/dL (ref 13.0–17.0)
MCH: 28.8 pg (ref 26.0–34.0)
MCHC: 33.4 g/dL (ref 30.0–36.0)
MCV: 86 fL (ref 80.0–100.0)
Platelets: 144 10*3/uL — ABNORMAL LOW (ref 150–400)
RBC: 4.73 MIL/uL (ref 4.22–5.81)
RDW: 13.7 % (ref 11.5–15.5)
WBC: 8.1 10*3/uL (ref 4.0–10.5)
nRBC: 0 % (ref 0.0–0.2)

## 2024-02-20 LAB — VALPROIC ACID LEVEL: Valproic Acid Lvl: 108 ug/mL — ABNORMAL HIGH (ref 50–100)

## 2024-02-20 LAB — HEPARIN LEVEL (UNFRACTIONATED): Heparin Unfractionated: 0.48 [IU]/mL (ref 0.30–0.70)

## 2024-02-20 MED ORDER — APIXABAN 5 MG PO TABS
5.0000 mg | ORAL_TABLET | Freq: Two times a day (BID) | ORAL | Status: AC
  Administered 2024-02-20 (×2): 5 mg via ORAL
  Filled 2024-02-20 (×2): qty 1

## 2024-02-20 NOTE — Plan of Care (Signed)
" °  Problem: Coping: Goal: Will identify appropriate support needs Outcome: Progressing   Problem: Self-Care: Goal: Verbalization of feelings and concerns over difficulty with self-care will improve Outcome: Progressing   Problem: Activity: Goal: Risk for activity intolerance will decrease Outcome: Progressing   "

## 2024-02-20 NOTE — Progress Notes (Addendum)
 ANTICOAGULATION CONSULT NOTE  Pharmacy Consult for Heparin  Indication: atrial fibrillation and stroke  Allergies[1]  Patient Measurements: Height: 5' 9 (175.3 cm) Weight: 98.6 kg (217 lb 6 oz) IBW/kg (Calculated) : 70.7 Heparin  Dosing Weight: 91.4 kg  Vital Signs: Temp: 97.4 F (36.3 C) (02/06 0403) Temp Source: Oral (02/06 0403) BP: 132/65 (02/06 0403) Pulse Rate: 59 (02/06 0403)  Labs: Recent Labs    02/18/24 1629 02/18/24 1634 02/18/24 1634 02/19/24 0224 02/19/24 1226 02/19/24 2145 02/20/24 0159  HGB 11.7* 11.6*  --  13.9  --   --  13.6  HCT 35.8* 34.0*  --  42.5  --   --  40.7  PLT 144*  --   --  153  --   --  144*  APTT 37*  --   --   --   --   --   --   LABPROT 14.8  --   --   --   --   --   --   INR 1.1  --   --   --   --   --   --   HEPARINUNFRC  --   --    < > 0.25* 0.26* 0.45 0.48  CREATININE 0.74 0.80  --  0.74  --   --  0.85   < > = values in this interval not displayed.    Estimated Creatinine Clearance: 77.6 mL/min (by C-G formula based on SCr of 0.85 mg/dL).   Medical History: Past Medical History:  Diagnosis Date   Arthritis    CAD (coronary artery disease)    Cancer (HCC)    skin lt. arm.   Carotid artery disease    s/p left CEA 01/17/14   Chronic systolic CHF (congestive heart failure) [facility])    Concussion Summer 2012   Essential hypertension 02/26/2017   Heart disease    Hyperlipidemia    Hypothyroidism 10/15/2017   Major depressive disorder    Mitral valve regurgitation    Myocardial infarction [facility]) 1996, 2021   Obstructive sleep apnea on CPAP    Persistent atrial fibrillation (HCC)    Poor flexibility of tendon 12/01/2018   S/P mitral valve clip implantation 12/23/2019   s/p TEER with MitraClip with one XTW by Dr. Wonda   Subungual hematoma of digit of hand 12/01/2018   Subungual hematoma of right foot 12/01/2018   Type 2 diabetes mellitus without complication, without long-term current use of insulin  (HCC) 02/26/2017     Assessment: 83 yom with a history of SAH, AF (previously on eliquis  which was stopped 2/2 to [facility] in December), HTN, HLD, CAD, CABG. Patient is presenting as stroke activation. Heparin  per pharmacy consult placed for atrial fibrillation and stroke. MRI pending. Neurology would like re-start anticoagulation now but with conservative stroke protocol dosing and level monitoring goals.  Patient is not on anticoagulation prior to arrival.  2/6 am: Heparin  level therapeutic at 0.48. CBC stable/WNL. No infusion issues noted.   Goal of Therapy:  Heparin  level 0.3-0.5 units/ml Monitor platelets by anticoagulation protocol: Yes   Plan:  Continue heparin  at 1300 units/hr Daily heparin  level and CBC F/U plans to transition to Eliquis     ADDENDUM:   Pharmacy consulted to transition heparin  to Eliquis .   - STOP heparin   - Start apixaban  5 mg PO BID- administer first dose of apixaban  at the same time the heparin  infusion is stopped   Massie Fila, PharmD Clinical Pharmacist  02/20/2024 7:34 AM             [  1]  Allergies Allergen Reactions   Sulfa Antibiotics Rash    Questionable, he developed a diffuse rash 2 days after stopping Bactrim

## 2024-02-20 NOTE — Discharge Instructions (Signed)
 Please cut back on your fluid intake and have your BMP (sodium levels) checked in 1 week.  An appointment request has been sent to your PCP.

## 2024-02-20 NOTE — Progress Notes (Signed)
 STROKE TEAM PROGRESS NOTE    INTERIM HISTORY/SUBJECTIVE Patient son is on the phone.  Patient lying bed, neuro intact, no acute event overnight.  Will transition from heparin  IV to Eliquis .  Depakote  level 108, continue 750 mg twice daily.  OBJECTIVE  CBC    Component Value Date/Time   WBC 8.1 02/20/2024 0159   RBC 4.73 02/20/2024 0159   HGB 13.6 02/20/2024 0159   HGB 13.4 02/20/2021 1145   HCT 40.7 02/20/2024 0159   HCT 39.4 02/20/2021 1145   PLT 144 (L) 02/20/2024 0159   PLT 163 02/20/2021 1145   MCV 86.0 02/20/2024 0159   MCV 85 02/20/2021 1145   MCH 28.8 02/20/2024 0159   MCHC 33.4 02/20/2024 0159   RDW 13.7 02/20/2024 0159   RDW 13.1 02/20/2021 1145   LYMPHSABS 2.1 02/18/2024 1629   LYMPHSABS 2.0 12/30/2019 1401   MONOABS 0.9 02/18/2024 1629   EOSABS 0.1 02/18/2024 1629   EOSABS 0.1 12/30/2019 1401   BASOSABS 0.0 02/18/2024 1629   BASOSABS 0.0 12/30/2019 1401    BMET    Component Value Date/Time   NA 138 02/20/2024 0159   NA 142 02/20/2021 1145   K 4.5 02/20/2024 0159   CL 102 02/20/2024 0159   CO2 27 02/20/2024 0159   GLUCOSE 75 02/20/2024 0159   BUN 18 02/20/2024 0159   BUN 20 02/20/2021 1145   CREATININE 0.85 02/20/2024 0159   CREATININE 1.17 05/03/2022 1556   CALCIUM  8.8 (L) 02/20/2024 0159   EGFR 70 02/20/2021 1145   GFRNONAA >60 02/20/2024 0159    IMAGING past 24 hours No results found.   Vitals:   02/19/24 2023 02/19/24 2346 02/20/24 0403 02/20/24 1641  BP: 133/67 (!) 157/77 132/65 127/62  Pulse: (!) 57 (!) 59 (!) 59 (!) 56  Resp:   16 19  Temp: 97.7 F (36.5 C) 97.6 F (36.4 C) (!) 97.4 F (36.3 C) 98.7 F (37.1 C)  TempSrc: Oral Oral Oral   SpO2: 99% 99% 95% 92%  Weight:      Height:         PHYSICAL EXAM General:  Alert, well-nourished, well-developed patient in no acute distress Psych:  Mood and affect appropriate for situation CV: Regular rate and rhythm on monitor Respiratory:  Regular, unlabored respirations on room  air   NEURO:  Mental Status: AA&Ox3, patient is able to give clear and coherent history Speech/Language: speech is without dysarthria or aphasia.  Naming, repetition, fluency, and comprehension intact.  Cranial Nerves:  II: PERRL. Visual fields full.  III, IV, VI: EOMI. Eyelids elevate symmetrically.  V: Sensation is intact to light touch and symmetrical to face.  VII: Subtle right facial droop VIII: hearing intact to voice. IX, X: Phonation is normal.  KP:Dynloizm shrug 5/5. XII: tongue is midline without fasciculations. Motor: 5/5 strength to all muscle groups tested.  Tone: is normal and bulk is normal Sensation- Intact to light touch bilaterally.  Coordination: FTN intact bilaterally Gait- deferred   ASSESSMENT/PLAN  [name] is a 84 y.o. male with history of traumatic subarachnoid hemorrhage in 12/25, A-fib with Eliquis  stopped post subarachnoid hemorrhage, seizures, hypertension, hyperlipidemia, MI and CAD status post CABG admitted for a transient episode of aphasia and dysarthria.  Patient reports that yesterday, he took a nap and when he woke up, he had slurred speech and difficulty finding his words.  No seizure activity was noted.  MRI was negative for acute stroke, and EEG was negative for seizure activity.  Suspect that patient had a TIA.  NIH on Admission 0  Left brain TIA versus seizure episodes Code Stroke CT head stable small volume subdural and subarachnoid blood CTA head & neck no LVO, chronic moderate to severe left A1 stenosis and mild bilateral supraclinoid ICA stenoses MRI no acute infarct, trace residual subarachnoid blood within the left sylvian fissure, left hemispheric subdural hematoma, unchanged, chronic small vessel ischemic disease 2D Echo EF 55 to 60%, no atrial level shunt LDL 55 HgbA1c 5.5 Depakote  level 113-> 108 VTE prophylaxis -fully anticoagulated with heparin  No antithrombotic prior to admission, now transition from heparin  IV to  Eliquis  Continue home depakote  750 bid EEG normal limit Therapy recommendations:  Home Health PT Disposition: Pending, likely home  History of seizures with traumatic [facility] 12/26/23 admitted for left temporal lobe traumatic SAH after syncope with fall 6 days prior.  He presented with aphasia and dysarthria.  Seen by neurosurgery, no surgical intervention needed.  Repeat CT stable.  12/27/2023 EEG showed 1 episode of subclinical seizure, lasting 35 seconds.  Put on Keppra .  Eliquis  discontinued at that time. Developed some facial twitching briefly, increased keppra  to 1g bid and LTM EEG no more seizure.  02/01/2024 admitted again for episodes of slurred speech and right facial droop.  MRI negative for acute stroke.  CTV negative for sinus thrombosis.  Wife reported agitation, drowsiness and bad tamper, Keppra  discontinued and put on Depakote  500 twice daily.  EEG no seizure. Follow-up with PCP, Depakote  level 43 on 02/13/24, increase Depakote  to 750 twice daily. Has appointment with Dr. Ole at [facility] on 03/16/2024  Atrial fibrillation Home Meds: Metoprolol  XL 12.5 mg daily Continue telemetry monitoring CT repeat x 2 resolving previous SAH/SDH Transition from IV heparin  to Eliquis   Hypertension Home meds: Metoprolol  12.5 daily Stable now On home metoprolol  Maintain normotension  Hyperlipidemia Home meds: Atorvastatin  80 mg daily and ezetimibe  10 mg daily, resumed in hospital LDL 55, goal < 70 Continue statin and Zetia  at discharge  Dysphagia Patient has post-stroke dysphagia, SLP consulted On dysphagia 3 and thin liquid Advance diet as tolerated  Other Stroke Risk Factors Obesity, Body mass index is 32.1 kg/m., BMI >/= 30 associated with increased stroke risk, recommend weight loss, diet and exercise as appropriate  Coronary artery disease and MI status post CABG  Other Active Problems Depression on Zoloft  and the Paxil  BPH on Proscar   Hospital day # 0  Neurology will sign off.  Please call with questions. Pt will follow up with Dr. Gregg at [facility] on 03/16/2024. Thanks for the consult.   Raymond Cummins, MD PhD Stroke Neurology 02/20/2024 6:58 PM    To contact Stroke Continuity provider, please refer to [email or web address]. After hours, contact General Neurology

## 2024-02-20 NOTE — Progress Notes (Signed)
 " Progress Note   Patient: Raymond Christensen FMW:[id number] DOB: 1940/01/22 DOA: 02/18/2024     0 DOS: the patient was seen and examined on 02/20/2024    Brief hospital course: Raymond Christensen is a 84 y.o. male with history of CAD status post CABG and PCI, paroxysmal atrial fibrillation, sleep apnea, hypertension, diabetes mellitus type 2 on diet, mitral regurgitation status post mitral clip in 2021 recently admitted to the hospital on 12/26/2023 through 01/12/2024 for traumatic subdural hematoma and subarachnoid hemorrhage in the setting of Eliquis  which was discontinued and was admitted again on 02/01/2024 through 02/04/2024 for aphasia and right facial droop, who presented to the ER after patient had transient episode of difficulty speaking around noon time which resolved without any intervention.  Denied any weakness of upper or lower extremities.  Denies any visual symptoms.  While waiting in the ER patient had again had a similar symptom which resolved without any intervention.  Patient also complained of frequent urination.   ED Course: In the ER CT head shows left frontoparietal subdural hematoma 2 mm unchanged from prior with trace subarachnoid hemorrhage involving the left sylvian fissure within the occipital lobe decreased from prior.  MRI brain did not show anything acute.  Neurology was consulted.  Given the symptoms patient's presentation is concerning for TIA.  At this time neurologist felt that patient to be restarted on anticoagulation with heparin  in a controlled environment to recheck CT head in the morning given the recent traumatic intracranial hemorrhage.  Patient had stated he had frequent urination but UA is unremarkable.    Assessment and Plan:  TIA Appreciate neurology consult.  Given the recent traumatic subdural hematoma and subarachnoid hemorrhage which as per the neurologist is almost resolved, neurologist recommended starting patient on anticoagulation in a controlled setting and  recheck CT head.   Heparin  infusion was started.   Patient was kept on neurochecks.  EEG was done and was within normal limits. No seizures or epileptiform discharges were seen throughout the recording.  Since patient had recent 2D echo which will not be repeated.   Continued statins and Zetia . - Patient was transitioned from heparin  infusion to Eliquis  5 mg BID on 02/20/2024. - will continue to monitor.    - Plan to discharge home in a.m. if patient remains stable.   Paroxysmal atrial fibrillation  Presently in sinus rhythm.   Anticoagulation restarted with heparin  infusion closely monitoring for any neurological worsening given the recent traumatic subdural and subarachnoid hemorrhage.  Patient remained stable.  -Transitioned to Eliquis  as indicated above. - Continuing metoprolol  for rate control. - I called and updated the patient's cardiologist about the resumption in Eliquis .  Frequent urination, predominantly nocturia UA unremarkable, labs unremarkable. Fluid intake seems within normal limits.   A1c 5.5 Urine sodium, creatinine, osmolality within normal limits. Serum osmolality only slightly elevated. Of note, patient reported lower urine output overnight. Discussed with nephrology.  Feel increase in urination most likely due to increased fluid intake. Advised that patient cut back on fluids and recheck BMP in about a week. - Patient advised to cut back on fluids. - Repeat BMP in 1 week. - Per nephrology, if symptoms persist, would advise follow-up with urology.  History of CAD  Denies any chest pain. -  Continue statins, metoprolol .   - Eliquis  as above.   Hypothyroidism - Continue Synthroid  and liothyronine .  History of traumatic subdural and subarachnoid hemorrhage with seizures  on valproic  acid.    EEG within normal limits. No seizures  or epileptiform discharges were seen throughout the recording.  Monitored Depakote  levels. - Neurology following and recommended  continuing with prior dose of Depakote  (750 mg twice daily).  BPH on finasteride .   Patient with history of TURP in 2024 -Continue home medications  Depression on paroxetine  and sertraline . -Continue home medication  Sleep apnea on CPAP at bedtime.  History of mitral valve clip placement. Outpatient follow-up      Subjective: Patient has no new complaints today.  Reports that he only urinated a small quantity last night.  Physical Exam: BP 132/65 (BP Location: Right Wrist)   Pulse (!) 59   Temp (!) 97.4 F (36.3 C) (Oral)   Resp 16   Ht 5' 9 (1.753 m)   Wt 98.6 kg   SpO2 95%   BMI 32.10 kg/m    General: Alert, oriented X3  Eyes: Pupils equal, reactive  Oral cavity: moist mucous membranes  Head: Atraumatic, normocephalic  Neck: supple  Chest: clear to auscultation. No crackles, no wheezes  CVS: S1,S2 RRR. No murmurs  Abd: No distention, soft, non-tender. No masses palpable  Extr: No edema   MSK: No joint deformities or swelling  Neurological: Grossly intact.    Data Reviewed:    Latest Ref Rng & Units 02/20/2024    1:59 AM 02/19/2024    2:24 AM 02/18/2024    4:34 PM  CBC  WBC 4.0 - 10.5 K/uL 8.1  8.4    Hemoglobin 13.0 - 17.0 g/dL 86.3  86.0  88.3   Hematocrit 39.0 - 52.0 % 40.7  42.5  34.0   Platelets 150 - 400 K/uL 144  153        Latest Ref Rng & Units 02/20/2024    1:59 AM 02/19/2024    2:24 AM 02/18/2024    4:34 PM  BMP  Glucose 70 - 99 mg/dL 75  82  83   BUN 8 - 23 mg/dL 18  9  10    Creatinine 0.61 - 1.24 mg/dL 9.14  9.25  9.19   Sodium 135 - 145 mmol/L 138  140  139   Potassium 3.5 - 5.1 mmol/L 4.5  4.0  3.7   Chloride 98 - 111 mmol/L 102  102  103   CO2 22 - 32 mmol/L 27  30    Calcium  8.9 - 10.3 mg/dL 8.8  9.1       Family Communication: Spoke with son on phone  Disposition: Status is: Observation DVT PPx: Systemic anticoagulation with apixaban       Author: MDALA-GAUSI, Linnet Bottari AGATHA, MD 02/20/2024 2:40 PM  For on call review  [email or web address]    "

## 2024-02-20 NOTE — TOC Progression Note (Signed)
 Transition of Care [facility]) - Progression Note    Patient Details  Name: Raymond Christensen MRN: [id number] Date of Birth: 12/15/1940  [facility] CM/SW Contact  Andrez JULIANNA George, RN Phone Number: 02/20/2024, 2:15 PM  Clinical Narrative:     Pt to discharge back to [facility] tomorrow. CM called [facility] and they will not provide transportation for the patient. CM updated pts son and he will work with pts spouse to see what can be arranged.  ICM following.    Barriers to Discharge: Continued Medical Work up               Expected Discharge Plan and Services   Discharge Planning Services: CM Consult Post Acute Care Choice: Home Health Living arrangements for the past 2 months: Independent Living Facility                           HH Arranged: PT HH Agency:  International Aid/development Worker at [employer])         Social Drivers of Health (SDOH) Interventions SDOH Screenings   Food Insecurity: No Food Insecurity (02/19/2024)  Housing: Low Risk (02/19/2024)  Transportation Needs: No Transportation Needs (02/19/2024)  Utilities: Not At Risk (02/19/2024)  Alcohol Screen: Low Risk (07/31/2022)  Depression (PHQ2-9): Low Risk (01/20/2024)  Financial Resource Strain: Low Risk (07/31/2022)  Physical Activity: Inactive (07/31/2022)  Social Connections: Socially Integrated (02/19/2024)  Stress: No Stress Concern Present (07/31/2022)  Tobacco Use: Medium Risk (02/19/2024)  Health Literacy: Adequate Health Literacy (07/31/2022)    Readmission Risk Interventions     No data to display

## 2024-03-03 ENCOUNTER — Ambulatory Visit: Admitting: Clinical

## 2024-03-09 ENCOUNTER — Ambulatory Visit (HOSPITAL_COMMUNITY): Admitting: Psychiatry

## 2024-03-16 ENCOUNTER — Institutional Professional Consult (permissible substitution): Admitting: Neurology

## 2024-04-09 ENCOUNTER — Other Ambulatory Visit (HOSPITAL_COMMUNITY)

## 2024-05-11 ENCOUNTER — Ambulatory Visit: Admitting: Internal Medicine
